# Patient Record
Sex: Female | Born: 1955
Health system: Southern US, Community
[De-identification: ages and names within clinical notes are randomized; demographics above are authoritative.]

## PROBLEM LIST (undated history)

## (undated) DIAGNOSIS — Z807 Family history of other malignant neoplasms of lymphoid, hematopoietic and related tissues: Secondary | ICD-10-CM

## (undated) DIAGNOSIS — D219 Benign neoplasm of connective and other soft tissue, unspecified: Secondary | ICD-10-CM

## (undated) DIAGNOSIS — K449 Diaphragmatic hernia without obstruction or gangrene: Secondary | ICD-10-CM

## (undated) DIAGNOSIS — K219 Gastro-esophageal reflux disease without esophagitis: Secondary | ICD-10-CM

## (undated) DIAGNOSIS — N200 Calculus of kidney: Secondary | ICD-10-CM

## (undated) DIAGNOSIS — Z8049 Family history of malignant neoplasm of other genital organs: Secondary | ICD-10-CM

## (undated) DIAGNOSIS — K648 Other hemorrhoids: Secondary | ICD-10-CM

## (undated) DIAGNOSIS — I8390 Asymptomatic varicose veins of unspecified lower extremity: Secondary | ICD-10-CM

## (undated) DIAGNOSIS — F411 Generalized anxiety disorder: Secondary | ICD-10-CM

## (undated) DIAGNOSIS — Z803 Family history of malignant neoplasm of breast: Secondary | ICD-10-CM

## (undated) DIAGNOSIS — C801 Malignant (primary) neoplasm, unspecified: Secondary | ICD-10-CM

## (undated) DIAGNOSIS — Z800 Family history of malignant neoplasm of digestive organs: Secondary | ICD-10-CM

## (undated) DIAGNOSIS — K589 Irritable bowel syndrome without diarrhea: Secondary | ICD-10-CM

## (undated) DIAGNOSIS — K5790 Diverticulosis of intestine, part unspecified, without perforation or abscess without bleeding: Secondary | ICD-10-CM

## (undated) DIAGNOSIS — D126 Benign neoplasm of colon, unspecified: Secondary | ICD-10-CM

## (undated) DIAGNOSIS — R740 Nonspecific elevation of levels of transaminase and lactic acid dehydrogenase [LDH]: Secondary | ICD-10-CM

## (undated) DIAGNOSIS — R519 Headache, unspecified: Secondary | ICD-10-CM

## (undated) DIAGNOSIS — E785 Hyperlipidemia, unspecified: Secondary | ICD-10-CM

## (undated) DIAGNOSIS — R7401 Elevation of levels of liver transaminase levels: Secondary | ICD-10-CM

## (undated) DIAGNOSIS — R7402 Elevation of levels of lactic acid dehydrogenase (LDH): Secondary | ICD-10-CM

## (undated) HISTORY — DX: Benign neoplasm of colon, unspecified: D12.6

## (undated) HISTORY — DX: Family history of malignant neoplasm of breast: Z80.3

## (undated) HISTORY — DX: Family history of other malignant neoplasms of lymphoid, hematopoietic and related tissues: Z80.7

## (undated) HISTORY — DX: Generalized anxiety disorder: F41.1

## (undated) HISTORY — DX: Family history of malignant neoplasm of other genital organs: Z80.49

## (undated) HISTORY — DX: Elevation of levels of liver transaminase levels: R74.01

## (undated) HISTORY — DX: Asymptomatic varicose veins of unspecified lower extremity: I83.90

## (undated) HISTORY — PX: CATARACT EXTRACTION, BILATERAL: SHX1313

## (undated) HISTORY — DX: Family history of malignant neoplasm of digestive organs: Z80.0

## (undated) HISTORY — DX: Gastro-esophageal reflux disease without esophagitis: K21.9

## (undated) HISTORY — DX: Other hemorrhoids: K64.8

## (undated) HISTORY — DX: Diaphragmatic hernia without obstruction or gangrene: K44.9

## (undated) HISTORY — DX: Diverticulosis of intestine, part unspecified, without perforation or abscess without bleeding: K57.90

## (undated) HISTORY — DX: Irritable bowel syndrome, unspecified: K58.9

## (undated) HISTORY — DX: Elevation of levels of lactic acid dehydrogenase (LDH): R74.02

## (undated) HISTORY — PX: LAPAROSCOPY: SHX197

## (undated) HISTORY — DX: Hyperlipidemia, unspecified: E78.5

## (undated) HISTORY — DX: Nonspecific elevation of levels of transaminase and lactic acid dehydrogenase (ldh): R74.0

## (undated) HISTORY — DX: Benign neoplasm of connective and other soft tissue, unspecified: D21.9

## (undated) HISTORY — PX: DILATION AND CURETTAGE OF UTERUS: SHX78

---

## 1990-05-23 HISTORY — PX: MYOMECTOMY: SHX85

## 1997-08-28 ENCOUNTER — Other Ambulatory Visit: Admission: RE | Admit: 1997-08-28 | Discharge: 1997-08-28 | Payer: Self-pay | Admitting: Obstetrics & Gynecology

## 1998-11-09 ENCOUNTER — Other Ambulatory Visit: Admission: RE | Admit: 1998-11-09 | Discharge: 1998-11-09 | Payer: Self-pay | Admitting: Obstetrics & Gynecology

## 1999-05-24 HISTORY — PX: OTHER SURGICAL HISTORY: SHX169

## 1999-07-27 ENCOUNTER — Encounter: Admission: RE | Admit: 1999-07-27 | Discharge: 1999-07-27 | Payer: Self-pay | Admitting: Neurology

## 1999-07-27 ENCOUNTER — Encounter: Payer: Self-pay | Admitting: Neurology

## 1999-12-28 ENCOUNTER — Other Ambulatory Visit: Admission: RE | Admit: 1999-12-28 | Discharge: 1999-12-28 | Payer: Self-pay | Admitting: Obstetrics & Gynecology

## 2000-01-12 ENCOUNTER — Ambulatory Visit (HOSPITAL_BASED_OUTPATIENT_CLINIC_OR_DEPARTMENT_OTHER): Admission: RE | Admit: 2000-01-12 | Discharge: 2000-01-12 | Payer: Self-pay | Admitting: Urology

## 2001-02-26 ENCOUNTER — Other Ambulatory Visit: Admission: RE | Admit: 2001-02-26 | Discharge: 2001-02-26 | Payer: Self-pay | Admitting: Obstetrics & Gynecology

## 2002-04-24 ENCOUNTER — Other Ambulatory Visit: Admission: RE | Admit: 2002-04-24 | Discharge: 2002-04-24 | Payer: Self-pay | Admitting: Obstetrics and Gynecology

## 2003-05-14 ENCOUNTER — Other Ambulatory Visit: Admission: RE | Admit: 2003-05-14 | Discharge: 2003-05-14 | Payer: Self-pay | Admitting: Obstetrics & Gynecology

## 2004-05-14 ENCOUNTER — Other Ambulatory Visit: Admission: RE | Admit: 2004-05-14 | Discharge: 2004-05-14 | Payer: Self-pay | Admitting: Obstetrics & Gynecology

## 2004-08-24 ENCOUNTER — Ambulatory Visit: Payer: Self-pay | Admitting: Gastroenterology

## 2004-09-08 ENCOUNTER — Ambulatory Visit: Payer: Self-pay | Admitting: Gastroenterology

## 2004-09-10 ENCOUNTER — Ambulatory Visit (HOSPITAL_COMMUNITY): Admission: RE | Admit: 2004-09-10 | Discharge: 2004-09-10 | Payer: Self-pay | Admitting: Gastroenterology

## 2004-10-12 ENCOUNTER — Ambulatory Visit: Payer: Self-pay | Admitting: Gastroenterology

## 2005-05-17 ENCOUNTER — Other Ambulatory Visit: Admission: RE | Admit: 2005-05-17 | Discharge: 2005-05-17 | Payer: Self-pay | Admitting: Obstetrics & Gynecology

## 2006-04-27 ENCOUNTER — Ambulatory Visit: Payer: Self-pay | Admitting: Gastroenterology

## 2006-05-03 ENCOUNTER — Ambulatory Visit: Payer: Self-pay | Admitting: Gastroenterology

## 2006-05-12 ENCOUNTER — Ambulatory Visit: Payer: Self-pay | Admitting: Gastroenterology

## 2007-02-22 ENCOUNTER — Ambulatory Visit: Payer: Self-pay | Admitting: Gastroenterology

## 2007-02-22 LAB — CONVERTED CEMR LAB
ALT: 25 units/L (ref 0–35)
AST: 28 units/L (ref 0–37)
Albumin: 3.8 g/dL (ref 3.5–5.2)
Alkaline Phosphatase: 57 units/L (ref 39–117)
Bilirubin, Direct: 0.3 mg/dL (ref 0.0–0.3)
Total Bilirubin: 1.8 mg/dL — ABNORMAL HIGH (ref 0.3–1.2)
Total Protein: 6.5 g/dL (ref 6.0–8.3)

## 2008-05-28 DIAGNOSIS — K589 Irritable bowel syndrome without diarrhea: Secondary | ICD-10-CM

## 2008-05-28 DIAGNOSIS — K449 Diaphragmatic hernia without obstruction or gangrene: Secondary | ICD-10-CM | POA: Insufficient documentation

## 2008-05-28 DIAGNOSIS — K219 Gastro-esophageal reflux disease without esophagitis: Secondary | ICD-10-CM

## 2008-05-28 DIAGNOSIS — F411 Generalized anxiety disorder: Secondary | ICD-10-CM | POA: Insufficient documentation

## 2008-05-29 ENCOUNTER — Ambulatory Visit: Payer: Self-pay | Admitting: Gastroenterology

## 2008-05-29 DIAGNOSIS — R740 Nonspecific elevation of levels of transaminase and lactic acid dehydrogenase [LDH]: Secondary | ICD-10-CM

## 2008-05-29 DIAGNOSIS — R141 Gas pain: Secondary | ICD-10-CM | POA: Insufficient documentation

## 2008-05-29 DIAGNOSIS — R1011 Right upper quadrant pain: Secondary | ICD-10-CM

## 2008-05-29 DIAGNOSIS — R142 Eructation: Secondary | ICD-10-CM

## 2008-05-29 DIAGNOSIS — R143 Flatulence: Secondary | ICD-10-CM

## 2008-05-30 LAB — CONVERTED CEMR LAB
Basophils Absolute: 0 10*3/uL (ref 0.0–0.1)
Bilirubin, Direct: 0.2 mg/dL (ref 0.0–0.3)
Eosinophils Absolute: 0.1 10*3/uL (ref 0.0–0.7)
Eosinophils Relative: 1.1 % (ref 0.0–5.0)
HCT: 40.2 % (ref 36.0–46.0)
MCHC: 34.6 g/dL (ref 30.0–36.0)
MCV: 92.8 fL (ref 78.0–100.0)
Monocytes Absolute: 0.5 10*3/uL (ref 0.1–1.0)
Neutrophils Relative %: 62.2 % (ref 43.0–77.0)
Platelets: 281 10*3/uL (ref 150–400)
RDW: 12.5 % (ref 11.5–14.6)
Total Protein: 7.1 g/dL (ref 6.0–8.3)
WBC: 7.6 10*3/uL (ref 4.5–10.5)

## 2008-06-12 ENCOUNTER — Ambulatory Visit: Payer: Self-pay | Admitting: Gastroenterology

## 2008-06-23 ENCOUNTER — Ambulatory Visit (HOSPITAL_COMMUNITY): Admission: RE | Admit: 2008-06-23 | Discharge: 2008-06-23 | Payer: Self-pay | Admitting: Internal Medicine

## 2008-07-10 ENCOUNTER — Telehealth: Payer: Self-pay | Admitting: Gastroenterology

## 2008-12-25 ENCOUNTER — Encounter: Payer: Self-pay | Admitting: Gastroenterology

## 2009-10-15 ENCOUNTER — Encounter: Admission: RE | Admit: 2009-10-15 | Discharge: 2009-10-15 | Payer: Self-pay | Admitting: Obstetrics & Gynecology

## 2010-08-19 ENCOUNTER — Telehealth: Payer: Self-pay | Admitting: Gastroenterology

## 2010-08-19 ENCOUNTER — Encounter: Payer: Self-pay | Admitting: Gastroenterology

## 2010-08-19 NOTE — Telephone Encounter (Signed)
Pt hasn't been seen since 06/12/2008. Hx of Elev. LFT's, GAS and Abd. Pain, IBS, GERD, HH. Pt reports she woke up Monday, 08/16/10, with cramping in LL abdomen. The area was also tender when pressed. She states she feels better now, but feels as though she can feel food move thru her system. Her stools are normal now, not black or loose. She denies a fever. Pt offered to try Align and Yogurt like she did before. Pt will try this and call if problem gets worse. Informed pt she needs to f/u with Dr Jarold Motto since it's been 2 years. She stated Dr Jacky Kindle does a physical every year and her LFT's are ok. Pt given appt  With Dr Jarold Motto for 09/07/10 @ 1130am.  [facility]  [address] [place], [place] [address]  Phone: [phone]  Fax: [phone]       August 19, 2010 MRN: [id number]    [facility] [address] [place], [place]  [address]    Dear Ms. DEMARCO,   You have a return appointment with Dr. Jarold Motto on September 07, 2010 at 1130am.  Please remember to bring a complete list of the medicines you are taking, your insurance card and your co-pay.  If you have to cancel or reschedule this appointment, please call before 5:00 pm the evening before to avoid a cancellation fee.  If you have any questions or concerns, please call [phone].    Sincerely,    Graciella Freer RN   Signed by Graciella Freer RN on 08/19/2010 at 10:41 AM  ________________________________________________________________________

## 2010-08-24 NOTE — Letter (Signed)
Summary: Appt Reminder 2  [facility]  [address] [place], [place] [address]   Phone: [phone]  Fax: [phone]        August 19, 2010 MRN: [id number]    [facility] [address] [place], [place]  [address]    Dear Ms. LACINA,   You have a return appointment with Dr. Jarold Motto on September 07, 2010 at 1130am.  Please remember to bring a complete list of the medicines you are taking, your insurance card and your co-pay.  If you have to cancel or reschedule this appointment, please call before 5:00 pm the evening before to avoid a cancellation fee.  If you have any questions or concerns, please call [phone].    Sincerely,    Graciella Freer RN

## 2010-09-06 ENCOUNTER — Encounter: Payer: Self-pay | Admitting: Gastroenterology

## 2010-09-07 ENCOUNTER — Ambulatory Visit: Payer: Self-pay | Admitting: Gastroenterology

## 2010-09-10 ENCOUNTER — Other Ambulatory Visit: Payer: Self-pay | Admitting: Dermatology

## 2010-09-16 ENCOUNTER — Encounter: Payer: Self-pay | Admitting: Gastroenterology

## 2010-10-08 NOTE — Assessment & Plan Note (Signed)
[facility]                         GASTROENTEROLOGY OFFICE NOTE   NAME:JOHNSONTayja, [name]                       MRN:          [id number]  DATE:05/12/2006                            DOB:          [birth year]    Stacy Rivas's repeat liver function tests were normal and all of her  immunological and metabolic studies for causes of occult liver disease  were normal. I suspect some of her abnormal liver function tests were  related to her vitamin supplements that she was using. She has had  negative hepatitis serologies additionally. Upper abdominal ultrasound  exam on May 05, 2006  was entirely normal without evidence of  chololithiasis. The liver showed no mass lesions, or dilated biliary  structures.   I would suggest at this time that we follow Stacy Rivas's liver enzymes at  every 6 month intervals as long as she is asymptomatic. I have advised  her about overuse of alcohol, over-the-counter medications, and Tylenol.  She uses Nexium on a p.r.n. basis for her acid reflux.     Vania Rea. Jarold Motto, MD, Caleen Essex, FAGA  Electronically Signed    DRP/MedQ  DD: 05/12/2006  DT: 05/12/2006  Job #: [id number]   cc:   Geoffry Paradise, M.D.

## 2010-10-08 NOTE — Assessment & Plan Note (Signed)
[facility]                         GASTROENTEROLOGY OFFICE NOTE   Stacy, Rivas                       MRN:          [id number]  DATE:04/27/2006                            DOB:          1955/08/22    Stacy Rivas is a middle-aged white female referred by Dr. Jacky Kindle for  evaluation of abnormal liver function tests.   I previously saw Stacy Rivas in April, 2006 for acid reflux problems.  She  underwent screening colonoscopy which was unremarkable, and upper GI  endoscopy showed a hiatal hernia and reflux esophagitis.  She has been  doing well on Nexium since that time.  Biopsy of her stomach did not  show evidence of Helicobacter pylori infection.  Lab data from that time  was unremarkable.   Patient, since that time, has had a prolonged viral infection  ver the  last 6-8 months, and has been seen by a neurologist and rheumatologist,  and apparently no definite diagnosis has been determined.  Her liver  function tests, apparently, have vacillated over the last 6-8 months  between normal and elevations less than twice normal with normal  bilirubin and alkaline phosphatase levels.  CBC and sed rate had been  normal.  TSH level, also, has been normal as was upper abdominal  ultrasound exam a year ago.   She continues to complain of some vague arthralgias and a papular skin  rash over her hands, and some mild fatigue.  She denies pruritus, mental  status changes, abdominal pain, nausea, vomiting, anorexia or weight  loss.  In fact, she has gained 10 pounds of weight over the last year.  She denies abuse of alcohol, but does drink regularly.  She is on a  variety of multivitamins including Biotin 5000 mcg a day, evening  primrose oil 1300 mg a day, and periodic Nexium.   She denies abdominal pain, dyspepsia, dysphagia, lower back problems,  melena or hematochezia.  She is under a lot of stress at her job in the  [employer].  Other complaints  have included nonspecific alopecia.   FAMILY HISTORY:  Noncontributory.   ALLERGIES:  IN THE PAST SHE HAS HAD REACTIONS TO SULFA MEDICATIONS.   PAST MEDICAL HISTORY:  Otherwise noncontributory, except for chronic  anxiety syndrome and acid reflux.   REVIEW OF SYSTEMS:  Noncontributory.  The patient denies mental status  changes, or any other neurological difficulties at this time.  In terms  of her viral syndrome, she says she is improving daily.  In the past,  she has been treated for interstitial cystitis.   PHYSICAL EXAMINATION:  She is a healthy-appearing white female without  stigmata of chronic liver disease.  She weighs 156 pounds, which is up  10 pounds from a year ago.  Blood pressure is 130/76 and pulse was 76 and regular.  Could not appreciate stigmata of chronic liver disease.  There was no  hepatosplenomegaly, abdominal masses, or tenderness.  She did have a  fine papular rash over the dorsum of her hands, but no other rashes or  spiders.  Mental status was clear.   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  She needs to go through a full  metabolic liver workup for all immune processes, metabolic processes,  and familial liver diseases.  She has had a rather marked weight gain  over the last year may have as simple a process as fatty infiltration of  her liver.   RECOMMENDATIONS:  1. Repeat upper abdominal ultrasound exam.  2. Check ANA, AMA, alpha1- anti-trypsin level, anti-smooth muscle      antibody, iron levels, ferritin, etc.  3. Discontinue all over-the-counter nutritional supplements.  4. GI followup in 2 weeks' time.  We will go over lab data, ultrasound      report, and see if she needs further evaluation, including liver      biopsy.     Stacy Rea. Jarold Motto, MD, Caleen Essex, FAGA  Electronically Signed    DRP/MedQ  DD:  04/27/2006  DT: 04/27/2006  Job #: [id number]   cc:   Geoffry Paradise, M.D.

## 2010-10-08 NOTE — Op Note (Signed)
[facility]  Patient:    Stacy Rivas, Stacy Rivas                       MRN: [id number] Proc. Date: 01/12/00 Adm. Date:  [id number] Disc. Date: [id number] Attending:  Thermon Leyland CC:         Gerrit Friends. Aldona Bar, M.D.   Operative Report  PREOPERATIVE DIAGNOSIS:  Chronic pelvic pain/probable interstitial cystitis.  POSTOPERATIVE DIAGNOSIS:  Chronic pelvic pain/probable interstitial cystitis.  PROCEDURE PERFORMED:  Cystoscopy, urethral dilation, hydraulic over-distention of the bladder, instillation of Clorpactin and Marcaine.  SURGEON:  Barron Alvine, M.D.  ANESTHESIA:  General.  INDICATIONS:  [name] is a 55 year old female.  She reports that for many years she has had recurrent bouts of cystitis.  It appears that often these symptoms resolve on their own and it does not appear by her history that these have always been episodes of bacterial cystitis.  She seems to have had intermittent flare-ups of bladder pressure and irritability.  She recently was evaluated because of suprapubic pressure, frequency and nocturia.  She was given some antibiotics but her symptoms continued to linger.  When we saw her, her urinalysis was clear on several occasions.  She continued to have suprapubic pressure along with pelvic discomfort and frequency of urination. I felt that potentially she had irritable bladder syndrome, interstitial cystitis.  I placed her on additional medication to try to suppress some of her pressure, including Ditropan XL and Uromax.  We initiated interstitial cystitis diet.  Despite these maneuvers, she continued to have lingering symptoms and has had problems now for approximately two months.  At this point she elected to proceed with additional evaluation including cystoscopy with hydraulic over-distention of the bladder.  TECHNIQUE AND FINDINGS:  The patient was brought to the operating room where she had successful induction of  general anesthesia.  She was placed in lithotomy position and prepped and draped in the usual manner.  The urethra was gently dilated from 22 to 32 [place].  Cystoscopy revealed an unremarkable bladder initially.  She underwent hydraulic over-distention to 80 cm of water pressure, which was held for five minutes.  Capacity was approximately 750 cc. She had 2 to 3+ glomerulations.  These were fairly prominent in the dome of the bladder and also on the lateral walls.  It was not complete diffuse glomerulations.  We repeated her hydrodistention.  We then instilled Clorpactin under standard concentration under gravity drainage, 100 to 150 cc at a time to wash her bladder with this medication.  We then instilled some Marcaine in her bladder.  The patient appeared to tolerate the procedure well.  She was brought to the recovery room in stable condition. DD:  01/12/00 TD:  01/12/00 Job: [id number]

## 2011-01-27 ENCOUNTER — Other Ambulatory Visit: Payer: Self-pay | Admitting: Obstetrics & Gynecology

## 2011-01-27 DIAGNOSIS — R928 Other abnormal and inconclusive findings on diagnostic imaging of breast: Secondary | ICD-10-CM

## 2011-02-01 ENCOUNTER — Ambulatory Visit
Admission: RE | Admit: 2011-02-01 | Discharge: 2011-02-01 | Disposition: A | Discharge status: Home / Self Care | Payer: 59 | Source: Ambulatory Visit | Attending: Obstetrics & Gynecology | Admitting: Obstetrics & Gynecology

## 2011-02-01 DIAGNOSIS — R928 Other abnormal and inconclusive findings on diagnostic imaging of breast: Secondary | ICD-10-CM

## 2011-07-29 ENCOUNTER — Other Ambulatory Visit: Payer: Self-pay | Admitting: Obstetrics & Gynecology

## 2011-07-29 DIAGNOSIS — N63 Unspecified lump in unspecified breast: Secondary | ICD-10-CM

## 2011-08-11 ENCOUNTER — Ambulatory Visit
Admission: RE | Admit: 2011-08-11 | Discharge: 2011-08-11 | Disposition: A | Discharge status: Home / Self Care | Payer: 59 | Source: Ambulatory Visit | Attending: Obstetrics & Gynecology | Admitting: Obstetrics & Gynecology

## 2011-08-11 DIAGNOSIS — N63 Unspecified lump in unspecified breast: Secondary | ICD-10-CM

## 2012-01-26 ENCOUNTER — Other Ambulatory Visit: Payer: Self-pay | Admitting: Obstetrics & Gynecology

## 2012-01-26 DIAGNOSIS — N6009 Solitary cyst of unspecified breast: Secondary | ICD-10-CM

## 2012-02-02 ENCOUNTER — Ambulatory Visit
Admission: RE | Admit: 2012-02-02 | Discharge: 2012-02-02 | Disposition: A | Discharge status: Home / Self Care | Payer: 59 | Source: Ambulatory Visit | Attending: Obstetrics & Gynecology | Admitting: Obstetrics & Gynecology

## 2012-02-02 DIAGNOSIS — N6009 Solitary cyst of unspecified breast: Secondary | ICD-10-CM

## 2013-02-25 ENCOUNTER — Other Ambulatory Visit: Payer: Self-pay | Admitting: Obstetrics & Gynecology

## 2013-03-04 ENCOUNTER — Other Ambulatory Visit: Payer: Self-pay | Admitting: Obstetrics & Gynecology

## 2013-03-04 DIAGNOSIS — N63 Unspecified lump in unspecified breast: Secondary | ICD-10-CM

## 2013-03-18 ENCOUNTER — Other Ambulatory Visit: Payer: 59

## 2013-03-22 ENCOUNTER — Other Ambulatory Visit: Payer: 59

## 2013-05-14 ENCOUNTER — Ambulatory Visit
Admission: RE | Admit: 2013-05-14 | Discharge: 2013-05-14 | Disposition: A | Discharge status: Home / Self Care | Payer: BC Managed Care – PPO | Source: Ambulatory Visit | Attending: Obstetrics & Gynecology | Admitting: Obstetrics & Gynecology

## 2013-05-14 DIAGNOSIS — N63 Unspecified lump in unspecified breast: Secondary | ICD-10-CM

## 2013-07-21 DIAGNOSIS — N200 Calculus of kidney: Secondary | ICD-10-CM

## 2013-07-21 HISTORY — PX: LITHOTRIPSY: SUR834

## 2013-07-21 HISTORY — DX: Calculus of kidney: N20.0

## 2013-08-04 ENCOUNTER — Encounter (HOSPITAL_COMMUNITY): Payer: Self-pay | Admitting: Emergency Medicine

## 2013-08-04 ENCOUNTER — Emergency Department (HOSPITAL_COMMUNITY): Payer: BC Managed Care – PPO

## 2013-08-04 ENCOUNTER — Emergency Department (HOSPITAL_COMMUNITY)
Admission: EM | Admit: 2013-08-04 | Discharge: 2013-08-04 | Disposition: A | Discharge status: Home / Self Care | Payer: BC Managed Care – PPO | Attending: Emergency Medicine | Admitting: Emergency Medicine

## 2013-08-04 DIAGNOSIS — Z87891 Personal history of nicotine dependence: Secondary | ICD-10-CM | POA: Insufficient documentation

## 2013-08-04 DIAGNOSIS — N201 Calculus of ureter: Secondary | ICD-10-CM

## 2013-08-04 DIAGNOSIS — K589 Irritable bowel syndrome without diarrhea: Secondary | ICD-10-CM | POA: Insufficient documentation

## 2013-08-04 DIAGNOSIS — N200 Calculus of kidney: Secondary | ICD-10-CM

## 2013-08-04 DIAGNOSIS — N390 Urinary tract infection, site not specified: Secondary | ICD-10-CM

## 2013-08-04 DIAGNOSIS — R231 Pallor: Secondary | ICD-10-CM | POA: Insufficient documentation

## 2013-08-04 DIAGNOSIS — F411 Generalized anxiety disorder: Secondary | ICD-10-CM | POA: Insufficient documentation

## 2013-08-04 DIAGNOSIS — Z79899 Other long term (current) drug therapy: Secondary | ICD-10-CM | POA: Insufficient documentation

## 2013-08-04 DIAGNOSIS — K219 Gastro-esophageal reflux disease without esophagitis: Secondary | ICD-10-CM | POA: Insufficient documentation

## 2013-08-04 LAB — URINALYSIS, ROUTINE W REFLEX MICROSCOPIC
BILIRUBIN URINE: NEGATIVE
GLUCOSE, UA: NEGATIVE mg/dL
KETONES UR: NEGATIVE mg/dL
NITRITE: NEGATIVE
PH: 7.5 (ref 5.0–8.0)
Protein, ur: 30 mg/dL — AB
SPECIFIC GRAVITY, URINE: 1.013 (ref 1.005–1.030)
Urobilinogen, UA: 0.2 mg/dL (ref 0.0–1.0)

## 2013-08-04 LAB — CBC WITH DIFFERENTIAL/PLATELET
Basophils Absolute: 0 10*3/uL (ref 0.0–0.1)
Basophils Relative: 0 % (ref 0–1)
EOS PCT: 0 % (ref 0–5)
Eosinophils Absolute: 0.1 10*3/uL (ref 0.0–0.7)
HEMATOCRIT: 42.7 % (ref 36.0–46.0)
HEMOGLOBIN: 14.9 g/dL (ref 12.0–15.0)
LYMPHS PCT: 13 % (ref 12–46)
Lymphs Abs: 1.6 10*3/uL (ref 0.7–4.0)
MCH: 31.6 pg (ref 26.0–34.0)
MCHC: 34.9 g/dL (ref 30.0–36.0)
MCV: 90.5 fL (ref 78.0–100.0)
MONO ABS: 0.8 10*3/uL (ref 0.1–1.0)
MONOS PCT: 6 % (ref 3–12)
NEUTROS ABS: 10 10*3/uL — AB (ref 1.7–7.7)
Neutrophils Relative %: 80 % — ABNORMAL HIGH (ref 43–77)
Platelets: 263 10*3/uL (ref 150–400)
RBC: 4.72 MIL/uL (ref 3.87–5.11)
RDW: 12.7 % (ref 11.5–15.5)
WBC: 12.5 10*3/uL — AB (ref 4.0–10.5)

## 2013-08-04 LAB — URINE MICROSCOPIC-ADD ON

## 2013-08-04 LAB — BASIC METABOLIC PANEL
BUN: 16 mg/dL (ref 6–23)
CHLORIDE: 102 meq/L (ref 96–112)
CO2: 23 meq/L (ref 19–32)
Calcium: 9.6 mg/dL (ref 8.4–10.5)
Creatinine, Ser: 0.74 mg/dL (ref 0.50–1.10)
GFR calc non Af Amer: >90 mL/min (ref >90)
Glucose, Bld: 118 mg/dL — ABNORMAL HIGH (ref 70–99)
POTASSIUM: 3.4 meq/L — AB (ref 3.7–5.3)
Sodium: 140 mEq/L (ref 137–147)

## 2013-08-04 MED ORDER — TAMSULOSIN HCL 0.4 MG PO CAPS
ORAL_CAPSULE | ORAL | Status: DC
Start: 2013-08-04 — End: 2013-09-27

## 2013-08-04 MED ORDER — MORPHINE SULFATE 4 MG/ML IJ SOLN
4.0000 mg | Freq: Once | INTRAMUSCULAR | Status: AC
  Administered 2013-08-04: 4 mg via INTRAVENOUS
  Filled 2013-08-04: qty 1

## 2013-08-04 MED ORDER — ONDANSETRON HCL 4 MG/2ML IJ SOLN
4.0000 mg | Freq: Once | INTRAMUSCULAR | Status: AC
  Administered 2013-08-04: 4 mg via INTRAVENOUS
  Filled 2013-08-04: qty 2

## 2013-08-04 MED ORDER — DEXTROSE 5 % IV SOLN
1.0000 g | Freq: Once | INTRAVENOUS | Status: AC
  Administered 2013-08-04: 1 g via INTRAVENOUS
  Filled 2013-08-04: qty 10

## 2013-08-04 MED ORDER — SODIUM CHLORIDE 0.9 % IV SOLN
INTRAVENOUS | Status: DC
  Administered 2013-08-04: 09:00:00 via INTRAVENOUS

## 2013-08-04 MED ORDER — CEPHALEXIN 500 MG PO CAPS: 500.0000 mg | ORAL_CAPSULE | Freq: Three times a day (TID) | ORAL | Status: DC

## 2013-08-04 MED ORDER — OXYCODONE-ACETAMINOPHEN 5-325 MG PO TABS: ORAL_TABLET | ORAL | Status: DC

## 2013-08-04 MED ORDER — HYDROMORPHONE HCL PF 1 MG/ML IJ SOLN
1.0000 mg | Freq: Once | INTRAMUSCULAR | Status: AC
  Administered 2013-08-04: 1 mg via INTRAVENOUS
  Filled 2013-08-04: qty 1

## 2013-08-04 MED ORDER — ONDANSETRON 4 MG PO TBDP: 4.0000 mg | ORAL_TABLET | Freq: Three times a day (TID) | ORAL | Status: DC | PRN

## 2013-08-04 NOTE — ED Notes (Addendum)
Pt c/o abd pain and nausea since 1930 last night, states it eased off now has gotten worse. Pt now vomited once at registration.

## 2013-08-04 NOTE — ED Notes (Signed)
Patient transported to CT 

## 2013-08-04 NOTE — ED Provider Notes (Signed)
CSN: [id number]     Arrival date & time 08/04/13  0848 History   First MD Initiated Contact with Patient 08/04/13 [phone]     Chief Complaint  Patient presents with  . Abdominal Pain  . Nausea  . Emesis     (Consider location/radiation/quality/duration/timing/severity/associated sxs/prior Treatment) HPI Patient reports last night she started getting severe left flank pain radiating into her left lower quadrant that does not go away but waxes and wanes. She states she is extremely uncomfortable and she cannot sit still because of the pain. Nothing she does makes the pain worse, nothing she does makes the pain better. She has had nausea and vomited once when she got to the ED. She denies diarrhea today however she did have diarrhea earlier in the week. She states she's never had this before. She denies any change in activity or diet. Patient does report she drinks a lot of milk and tea.  Patient reports her father has had renal stones  PCP Dr Reynaldo Minium   Past Medical History  Diagnosis Date  . Anxiety state, unspecified   . Hiatal hernia   . Esophageal reflux   . Nonspecific elevation of levels of transaminase or lactic acid dehydrogenase (LDH)   . Anxiety state, unspecified   . IBS (irritable bowel syndrome)    History reviewed. No pertinent past surgical history. Family History  Problem Relation Age of Onset  . Lymphoma Father   . Diabetes    . Heart disease Father    History  Substance Use Topics  . Smoking status: Former Research scientist (life sciences)  . Smokeless tobacco: Not on file  . Alcohol Use: Yes   emplyed  OB History   Grav Para Term Preterm Abortions TAB SAB Ect Mult Living                 Review of Systems  All other systems reviewed and are negative.      Allergies  Sulfonamide derivatives  Home Medications   Current Outpatient Rx  Name  Route  Sig  Dispense  Refill  . ALPRAZolam (XANAX) 0.25 MG tablet   Oral   Take 0.12 mg by mouth daily as needed for anxiety.         [name] Kitchen esomeprazole (NEXIUM) 20 MG packet   Oral   Take 20 mg by mouth daily as needed.         [name] Kitchen ibuprofen (ADVIL,MOTRIN) 200 MG tablet   Oral   Take 400 mg by mouth every 6 (six) hours as needed for moderate pain.         . Multiple Vitamins-Minerals (ALIVE ONCE DAILY WOMENS 50+) TABS   Oral   Take 1 tablet by mouth daily.         . pantoprazole (PROTONIX) 20 MG tablet   Oral   Take 20 mg by mouth daily as needed for heartburn.         Vladimir Faster Glycol-Propyl Glycol (SYSTANE) 0.4-0.3 % GEL   Both Eyes   Place 1 drop into both eyes daily as needed (dry eyes).          ED Triage Vitals  Enc Vitals Group     BP 08/04/13 1022 144/69 mmHg     Pulse Rate 08/04/13 1022 65     Resp 08/04/13 1022 22     Temp 08/04/13 1022 97.4 F (36.3 C)     Temp src 08/04/13 1022 Oral     SpO2 08/04/13 1022 100 %  Weight --      Height --      Head Cir --      Peak Flow --      Pain Score 08/04/13 1025 8     Pain Loc --      Pain Edu? --      Excl. in [place]? --      Vital signs normal   Physical Exam  Nursing note and vitals reviewed. Constitutional: She is oriented to person, place, and time. She appears well-developed and well-nourished.  Non-toxic appearance. She does not appear ill. She appears distressed.  Pt unable to lay still b/o pain  HENT:  Head: Normocephalic and atraumatic.  Right Ear: External ear normal.  Left Ear: External ear normal.  Nose: Nose normal. No mucosal edema or rhinorrhea.  Mouth/Throat: Oropharynx is clear and moist and mucous membranes are normal. No dental abscesses or uvula swelling.  Eyes: Conjunctivae and EOM are normal. Pupils are equal, round, and reactive to light.  Neck: Normal range of motion and full passive range of motion without pain. Neck supple.  Cardiovascular: Normal rate, regular rhythm and normal heart sounds.  Exam reveals no gallop and no friction rub.   No murmur heard. Pulmonary/Chest: Effort normal and breath sounds  normal. No respiratory distress. She has no wheezes. She has no rhonchi. She has no rales. She exhibits no tenderness and no crepitus.  nontender in left flank  Abdominal: Soft. Normal appearance and bowel sounds are normal. She exhibits no distension. There is no tenderness. There is no rebound and no guarding.  Musculoskeletal: Normal range of motion. She exhibits no edema and no tenderness.  Moves all extremities well.   Neurological: She is alert and oriented to person, place, and time. She has normal strength. No cranial nerve deficit.  Skin: Skin is warm, dry and intact. No rash noted. No erythema. There is pallor.  Psychiatric: Her speech is normal and behavior is normal. Her mood appears anxious.    ED Course  Procedures (including critical care time)  Medications  0.9 %  sodium chloride infusion ( Intravenous New Bag/Given 08/04/13 0928)  HYDROmorphone (DILAUDID) injection 1 mg (1 mg Intravenous Given 08/04/13 0927)  ondansetron (ZOFRAN) injection 4 mg (4 mg Intravenous Given 08/04/13 0925)  morphine 4 MG/ML injection 4 mg (4 mg Intravenous Given 08/04/13 1029)  cefTRIAXone (ROCEPHIN) 1 g in dextrose 5 % 50 mL IVPB (1 g Intravenous New Bag/Given 08/04/13 1323)   Recheck 10:10 states pain is overall better but still has some intermittent episodes of increased pain. More pain meds ordered. Reviewed her CT scan, and discussed the large prox UPJ stone and her staghorn stone. States she used to see urology for Interstitial Cystitis. As I was leaving the room, her pain got worse, morphine ordered.   12:00 pt states her pain is much improved. Just now able to give UA.   14:00 PT has had her antibiotics.  States her pain is much improved. We discussed reasons to return to the ED and to f/u with urology tomorrow.   Labs Review Results for orders placed during the hospital encounter of 08/04/13  URINALYSIS, ROUTINE W REFLEX MICROSCOPIC      Result Value Ref Range   Color, Urine YELLOW  YELLOW    APPearance TURBID (*) CLEAR   Specific Gravity, Urine 1.013  1.005 - 1.030   pH 7.5  5.0 - 8.0   Glucose, UA NEGATIVE  NEGATIVE mg/dL   Hgb urine dipstick LARGE (*)  NEGATIVE   Bilirubin Urine NEGATIVE  NEGATIVE   Ketones, ur NEGATIVE  NEGATIVE mg/dL   Protein, ur 30 (*) NEGATIVE mg/dL   Urobilinogen, UA 0.2  0.0 - 1.0 mg/dL   Nitrite NEGATIVE  NEGATIVE   Leukocytes, UA MODERATE (*) NEGATIVE  BASIC METABOLIC PANEL      Result Value Ref Range   Sodium 140  137 - 147 mEq/L   Potassium 3.4 (*) 3.7 - 5.3 mEq/L   Chloride 102  96 - 112 mEq/L   CO2 23  19 - 32 mEq/L   Glucose, Bld 118 (*) 70 - 99 mg/dL   BUN 16  6 - 23 mg/dL   Creatinine, Ser 0.74  0.50 - 1.10 mg/dL   Calcium 9.6  8.4 - 10.5 mg/dL   GFR calc non Af Amer >90  >90 mL/min   GFR calc Af Amer >90  >90 mL/min  CBC WITH DIFFERENTIAL      Result Value Ref Range   WBC 12.5 (*) 4.0 - 10.5 K/uL   RBC 4.72  3.87 - 5.11 MIL/uL   Hemoglobin 14.9  12.0 - 15.0 g/dL   HCT 42.7  36.0 - 46.0 %   MCV 90.5  78.0 - 100.0 fL   MCH 31.6  26.0 - 34.0 pg   MCHC 34.9  30.0 - 36.0 g/dL   RDW 12.7  11.5 - 15.5 %   Platelets 263  150 - 400 K/uL   Neutrophils Relative % 80 (*) 43 - 77 %   Neutro Abs 10.0 (*) 1.7 - 7.7 K/uL   Lymphocytes Relative 13  12 - 46 %   Lymphs Abs 1.6  0.7 - 4.0 K/uL   Monocytes Relative 6  3 - 12 %   Monocytes Absolute 0.8  0.1 - 1.0 K/uL   Eosinophils Relative 0  0 - 5 %   Eosinophils Absolute 0.1  0.0 - 0.7 K/uL   Basophils Relative 0  0 - 1 %   Basophils Absolute 0.0  0.0 - 0.1 K/uL  URINE MICROSCOPIC-ADD ON      Result Value Ref Range   Squamous Epithelial / LPF RARE  RARE   WBC, UA 7-10  <3 WBC/hpf   RBC / HPF 21-50  <3 RBC/hpf   Bacteria, UA FEW (*) RARE   Urine-Other MUCOUS PRESENT      Laboratory interpretation all normal except leukocytosis   Imaging Review Ct Abdomen Pelvis Wo Contrast  08/04/2013   CLINICAL DATA:  Left-sided abdominal pain  EXAM: CT ABDOMEN AND PELVIS WITHOUT CONTRAST   TECHNIQUE: Multidetector CT imaging of the abdomen and pelvis was performed following the standard protocol without intravenous contrast.  COMPARISON:  US ABDOMEN COMPLETE dated 06/23/2008  FINDINGS: The lung bases are clear. Staghorn type left upper renal pole calculi noted measuring 1.3 cm and 1.1 cm on images 18 and 17, respectively. There is mild left hydroureteronephrosis to the level of a 4 mm proximal left ureteral calculus image 29. Distal left ureteral decompression is identified. No other radiopaque renal or ureteral calculus is identified. Unenhanced liver, gallbladder, right kidney, adrenal glands, spleen, and pancreas are normal. No ascites, lymphadenopathy, or free fluid. Mild atheromatous aortic calcification without aneurysm.  Lobulated uterine contour with probable right posterior predominantly subserosal 8.7 cm fibroid image 62. Bladder is normal. Ovaries are normal. Normal appendix. No bowel wall thickening or focal segmental dilatation. No acute osseous finding.  IMPRESSION: 4 mm proximal left ureteral calculus producing mild left hydroureteronephrosis.  Staghorn  type nonobstructing left upper renal pole calculi.  Lobulated uterine contour likely indicating fibroids, 1 of which demonstrates a predominantly subserosal component and could be associated with pelvic symptoms due to size. This could be further evaluated with nonemergent outpatient pelvic MRI for further characterization and determination of possible treatment options.   Electronically Signed   By: Conchita Paris M.D.   On: 08/04/2013 09:55     EKG Interpretation None      MDM   Final diagnoses:  Ureteral stone  Staghorn calculus  UTI (urinary tract infection)    New Prescriptions   CEPHALEXIN (KEFLEX) 500 MG CAPSULE    Take 1 capsule (500 mg total) by mouth 3 (three) times daily.   ONDANSETRON (ZOFRAN ODT) 4 MG DISINTEGRATING TABLET    Take 1 tablet (4 mg total) by mouth every 8 (eight) hours as needed for nausea or  vomiting.   OXYCODONE-ACETAMINOPHEN (PERCOCET/ROXICET) 5-325 MG PER TABLET    Take 1 or 2 po Q 6hrs for pain   TAMSULOSIN (FLOMAX) 0.4 MG CAPS CAPSULE    Take 1 po QD until you pass the stone.    Plan discharge   Rolland Porter, MD, Alanson Aly, MD 08/04/13 [phone]

## 2013-08-04 NOTE — ED Notes (Signed)
Patient states that she cannot give sample at this time.  

## 2013-08-04 NOTE — Discharge Instructions (Signed)
Drink plenty of fluids.Take the medications as prescribed. Call Dr Cy Blamer office tomorrow to be seen in the office. If you get worse, such as fever, or uncontrolled vomiting or pain despite taking the medications, return to [facility] ED.    Ureteral Colic (Kidney Stones) Ureteral colic is the result of a condition when kidney stones form inside the kidney. Once kidney stones are formed they may move into the tube that connects the kidney with the bladder (ureter). If this occurs, this condition may cause pain (colic) in the ureter.  CAUSES  Pain is caused by stone movement in the ureter and the obstruction caused by the stone. SYMPTOMS  The pain comes and goes as the ureter contracts around the stone. The pain is usually intense, sharp, and stabbing in character. The location of the pain may move as the stone moves through the ureter. When the stone is near the kidney the pain is usually located in the back and radiates to the belly (abdomen). When the stone is ready to pass into the bladder the pain is often located in the lower abdomen on the side the stone is located. At this location, the symptoms may mimic those of a urinary tract infection with urinary frequency. Once the stone is located here it often passes into the bladder and the pain disappears completely. TREATMENT   Your caregiver will provide you with medicine for pain relief.  You may require specialized follow-up X-rays.  The absence of pain does not always mean that the stone has passed. It may have just stopped moving. If the urine remains completely obstructed, it can cause loss of kidney function or even complete destruction of the involved kidney. It is your responsibility and in your interest that X-rays and follow-ups as suggested by your caregiver are completed. Relief of pain without passage of the stone can be associated with severe damage to the kidney, including loss of kidney function on that side.  If your stone  does not pass on its own, additional measures may be taken by your caregiver to ensure its removal. HOME CARE INSTRUCTIONS   Increase your fluid intake. Water is the preferred fluid since juices containing vitamin C may acidify the urine making it less likely for certain stones (uric acid stones) to pass.  Strain all urine. A strainer will be provided. Keep all particulate matter or stones for your caregiver to inspect.  Take your pain medicine as directed.  Make a follow-up appointment with your caregiver as directed.  Remember that the goal is passage of your stone. The absence of pain does not mean the stone is gone. Follow your caregiver's instructions.  Only take over-the-counter or prescription medicines for pain, discomfort, or fever as directed by your caregiver. SEEK MEDICAL CARE IF:   Pain cannot be controlled with the prescribed medicine.  You have a fever.  Pain continues for longer than your caregiver advises it should.  There is a change in the pain, and you develop chest discomfort or constant abdominal pain.  You feel faint or pass out. MAKE SURE YOU:   Understand these instructions.  Will watch your condition.  Will get help right away if you are not doing well or get worse. Document Released: 02/16/2005 Document Revised: 09/03/2012 Document Reviewed: 11/03/2010 [facility] Patient Information 2014 [place], [place].    Kidney Stones Kidney stones (urolithiasis) are deposits that form inside your kidneys. The intense pain is caused by the stone moving through the urinary tract. When the stone  moves, the ureter goes into spasm around the stone. The stone is usually passed in the urine.  CAUSES   A disorder that makes certain neck glands produce too much parathyroid hormone (primary hyperparathyroidism).  A buildup of uric acid crystals, similar to gout in your joints.  Narrowing (stricture) of the ureter.  A kidney obstruction present at birth (congenital  obstruction).  Previous surgery on the kidney or ureters.  Numerous kidney infections. SYMPTOMS   Feeling sick to your stomach (nauseous).  Throwing up (vomiting).  Blood in the urine (hematuria).  Pain that usually spreads (radiates) to the groin.  Frequency or urgency of urination. DIAGNOSIS   Taking a history and physical exam.  Blood or urine tests.  CT scan.  Occasionally, an examination of the inside of the urinary bladder (cystoscopy) is performed. TREATMENT   Observation.  Increasing your fluid intake.  Extracorporeal shock wave lithotripsy This is a noninvasive procedure that uses shock waves to break up kidney stones.  Surgery may be needed if you have severe pain or persistent obstruction. There are various surgical procedures. Most of the procedures are performed with the use of small instruments. Only small incisions are needed to accommodate these instruments, so recovery time is minimized. The size, location, and chemical composition are all important variables that will determine the proper choice of action for you. Talk to your health care provider to better understand your situation so that you will minimize the risk of injury to yourself and your kidney.  HOME CARE INSTRUCTIONS   Drink enough water and fluids to keep your urine clear or pale yellow. This will help you to pass the stone or stone fragments.  Strain all urine through the provided strainer. Keep all particulate matter and stones for your health care provider to see. The stone causing the pain may be as small as a grain of salt. It is very important to use the strainer each and every time you pass your urine. The collection of your stone will allow your health care provider to analyze it and verify that a stone has actually passed. The stone analysis will often identify what you can do to reduce the incidence of recurrences.  Only take over-the-counter or prescription medicines for pain,  discomfort, or fever as directed by your health care provider.  Make a follow-up appointment with your health care provider as directed.  Get follow-up X-rays if required. The absence of pain does not always mean that the stone has passed. It may have only stopped moving. If the urine remains completely obstructed, it can cause loss of kidney function or even complete destruction of the kidney. It is your responsibility to make sure X-rays and follow-ups are completed. Ultrasounds of the kidney can show blockages and the status of the kidney. Ultrasounds are not associated with any radiation and can be performed easily in a matter of minutes. SEEK MEDICAL CARE IF:  You experience pain that is progressive and unresponsive to any pain medicine you have been prescribed. SEEK IMMEDIATE MEDICAL CARE IF:   Pain cannot be controlled with the prescribed medicine.  You have a fever or shaking chills.  The severity or intensity of pain increases over 18 hours and is not relieved by pain medicine.  You develop a new onset of abdominal pain.  You feel faint or pass out.  You are unable to urinate. MAKE SURE YOU:   Understand these instructions.  Will watch your condition.  Will get help right away if  you are not doing well or get worse. Document Released: 05/09/2005 Document Revised: 01/09/2013 Document Reviewed: 10/10/2012 [facility] Patient Information 2014 [place].  Diet for Kidney Stones Kidney stones are small, hard masses that form inside your kidneys. They are made up of salts and minerals and often form when high levels build up in the urine. The minerals can then start to build up, crystalize, and stick together to form stones. There are several different types of kidney stones. The following types of stones may be influenced by dietary factors:   Calcium Oxalate Stones. An oxalate is a salt found in certain foods. Within the body, calcium can combine with oxalates to form  calcium oxalate stones, which can be excreted in the urine in high amounts. This is the most common type of kidney stone.  Calcium Phosphate Stones. These stones may occur when the pH of the urine becomes too high, or less acidic, from too much calcium being excreted in the urine. The pH is a measure of how acidic or basic a substance is.  Uric Acid Stones. This type of stone occurs when the pH of the urine becomes too low, or very acidic, because substances called purines build up in the urine. Purines are found in animal proteins. When the urine is highly concentrated with acid, uric acid kidney stones can form.  Other risk factors for kidney stones include genetics, environment, and being overweight. Your caregiver may ask you to follow specific diet guidelines based on the type of stone you have to lessen the chances of your body making more kidney stones.  GENERAL GUIDELINES FOR ALL TYPES OF STONES  Drink plenty of fluid. Drink 12 16 cups of fluid a day, drinking mainly water.This is the most important thing you can do to prevent the formation of future kidney stones.  Maintain a healthy weight. Your caregiver or dietitian can help you determine what a healthy weight is for you. If you are overweight, weight loss may help prevent the formation of future kidney stones.  Eat a diet adequate in animal protein. Too much animal protein can contribute to the formation of stones. Your dietitian can help you determine how much protein you should be eating. Avoid low carbohydrate, high protein diets.  Follow a balanced eating approach. The DASH diet, which stands for "Dietary Approaches to Stop Hypertension," is an effective meal plan for reducing stone formation. This diet is high in fruits, vegetables, dairy, and whole grains and low in animal protein. Ask your caregiver or dietitian for information about the DASH diet. ADDITIONAL DIET GUIDELINES FOR CALCIUM STONES Avoid foods high in salt. This  includes table salt, salt seasonings, MSG, soy sauce, cured and processed meats, salted crackers and snack foods, fast food, and canned soups and foods. Ask your caregiver or dietitian for information about reducing sodium in your diet or following the low sodium diet.  Ensure adequate calcium intake. Use the following table for calcium guidelines:  Men 70 years old and younger  1000 mg/day.  Men 28 years old and older  1500 mg/day.  Women 78 60 years old  1000 mg/day.  Women 50 years and older  1500 mg/day. Your dietitian can help you determine if you are getting enough calcium in your diet. Foods that are high in calcium include dairy products, broccoli, cheese, yogurt, and pudding. If you need to take a calcium supplement, take it only in the form of calcium citrate.  Avoid foods high in oxalate. Be sure that  any supplements you take do not contain more than 500 mg of vitamin C. Vitamin C is converted into oxalate in the body. You do not need to avoid fruits and vegetables high in vitamin C.   Grains: High-fiber or bran cereal, whole-wheat bread, grits, barley, buckwheat, amaranth, pretzels, and fruitcake.  Vegetables: Dried beans, wax beans, dark leafy greens, eggplant, leeks, okra, parsley, rutabaga, tomato paste, watercress, zucchini, and escarole.  Fruit: Dried apricots, red currants, figs, kiwi, and rhubarb.  Meat and Meat Substitutes: Soybeans and foods made from soy (soyburger, miso), dried beans, peanut butter.  Milk: Chocolate milk mixes and soymilk.  Fats and Oils: Nuts (peanuts, almonds, pecans, cashews, hazelnuts) and nut butters, sesame seeds, and tDahini paste.  Condiments/Miscellaneous: Chocolate, carob, marmalade, poppy seeds, instant iced tea, and juice from high-oxalate fruits.  Document Released: 09/03/2010 Document Revised: 11/08/2011 Document Reviewed: 10/24/2011 [facility] Patient Information 2014 [place].   Urinary Tract Infection Urinary tract  infections (UTIs) can develop anywhere along your urinary tract. Your urinary tract is your body's drainage system for removing wastes and extra water. Your urinary tract includes two kidneys, two ureters, a bladder, and a urethra. Your kidneys are a pair of bean-shaped organs. Each kidney is about the size of your fist. They are located below your ribs, one on each side of your spine. CAUSES Infections are caused by microbes, which are microscopic organisms, including fungi, viruses, and bacteria. These organisms are so small that they can only be seen through a microscope. Bacteria are the microbes that most commonly cause UTIs. SYMPTOMS  Symptoms of UTIs may vary by age and gender of the patient and by the location of the infection. Symptoms in young women typically include a frequent and intense urge to urinate and a painful, burning feeling in the bladder or urethra during urination. Older women and men are more likely to be tired, shaky, and weak and have muscle aches and abdominal pain. A fever may mean the infection is in your kidneys. Other symptoms of a kidney infection include pain in your back or sides below the ribs, nausea, and vomiting. DIAGNOSIS To diagnose a UTI, your caregiver will ask you about your symptoms. Your caregiver also will ask to provide a urine sample. The urine sample will be tested for bacteria and white blood cells. White blood cells are made by your body to help fight infection. TREATMENT  Typically, UTIs can be treated with medication. Because most UTIs are caused by a bacterial infection, they usually can be treated with the use of antibiotics. The choice of antibiotic and length of treatment depend on your symptoms and the type of bacteria causing your infection. HOME CARE INSTRUCTIONS  If you were prescribed antibiotics, take them exactly as your caregiver instructs you. Finish the medication even if you feel better after you have only taken some of the  medication.  Drink enough water and fluids to keep your urine clear or pale yellow.  Avoid caffeine, tea, and carbonated beverages. They tend to irritate your bladder.  Empty your bladder often. Avoid holding urine for long periods of time.  Empty your bladder before and after sexual intercourse.  After a bowel movement, women should cleanse from front to back. Use each tissue only once. SEEK MEDICAL CARE IF:   You have back pain.  You develop a fever.  Your symptoms do not begin to resolve within 3 days. SEEK IMMEDIATE MEDICAL CARE IF:   You have severe back pain or lower abdominal pain.  You develop chills.  You have nausea or vomiting.  You have continued burning or discomfort with urination. MAKE SURE YOU:   Understand these instructions.  Will watch your condition.  Will get help right away if you are not doing well or get worse. Document Released: 02/16/2005 Document Revised: 11/08/2011 Document Reviewed: 06/17/2011 [facility] Patient Information 2014 [place].

## 2013-08-06 LAB — URINE CULTURE

## 2013-08-07 ENCOUNTER — Telehealth (HOSPITAL_COMMUNITY): Payer: Self-pay | Admitting: *Deleted

## 2013-08-07 NOTE — ED Notes (Signed)
+   urine Amoxicillin 250 mg TID x 7 days . Stop CephalexinChart.Reviewed by Charlann Lange -follow up? How is she? If she febrile return to ER.

## 2013-08-07 NOTE — ED Notes (Addendum)
Stop Cephalexin Amoxicillin 250 mg TID x 7 days written by Charlann Lange called to pharmacy by [facility]

## 2013-08-07 NOTE — Progress Notes (Signed)
ED Antimicrobial Stewardship Positive Culture Follow Up   Shantinique Picazo is an 58 y.o. female who presented to [facility] on 08/04/2013 with a chief complaint of  Chief Complaint  Patient presents with  . Abdominal Pain  . Nausea  . Emesis    Recent Results (from the past 720 hour(s))  URINE CULTURE     Status: None   Collection Time    08/04/13  1:32 PM      Result Value Ref Range Status   Specimen Description URINE, CLEAN CATCH   Final   Special Requests NONE   Final   Culture  Setup Time     Final   Value: 08/04/2013 18:59     Performed at [place]     Final   Value: >=100,000 COLONIES/ML     Performed at [facility]   Culture     Final   Value: ENTEROCOCCUS SPECIES     Performed at [facility]   Report Status 08/06/2013 FINAL   Final   Organism ID, Bacteria ENTEROCOCCUS SPECIES   Final    [x]  Treated with cephalexin, organism resistant to prescribed antimicrobial []  Patient discharged originally without antimicrobial agent and treatment is now indicated  New antibiotic prescription: amoxicillin 250mg  - 1 capsule three times daily for 7 days  ED Provider: Anderson Malta, PA-C   Candie Mile 08/07/2013, 10:05 AM Infectious Diseases Pharmacist Phone# [phone]

## 2013-08-13 ENCOUNTER — Encounter (HOSPITAL_COMMUNITY): Payer: Self-pay | Admitting: *Deleted

## 2013-08-13 ENCOUNTER — Other Ambulatory Visit: Payer: Self-pay | Admitting: Urology

## 2013-08-15 ENCOUNTER — Ambulatory Visit (HOSPITAL_COMMUNITY): Payer: BC Managed Care – PPO

## 2013-08-15 ENCOUNTER — Encounter (HOSPITAL_COMMUNITY)
Admission: RE | Disposition: A | Discharge status: Home / Self Care | Payer: Self-pay | Source: Ambulatory Visit | Attending: Urology

## 2013-08-15 ENCOUNTER — Encounter (HOSPITAL_COMMUNITY): Payer: Self-pay

## 2013-08-15 ENCOUNTER — Ambulatory Visit (HOSPITAL_COMMUNITY)
Admission: RE | Admit: 2013-08-15 | Discharge: 2013-08-15 | Disposition: A | Discharge status: Home / Self Care | Payer: BC Managed Care – PPO | Source: Ambulatory Visit | Attending: Urology | Admitting: Urology

## 2013-08-15 DIAGNOSIS — N200 Calculus of kidney: Secondary | ICD-10-CM | POA: Insufficient documentation

## 2013-08-15 DIAGNOSIS — Z87891 Personal history of nicotine dependence: Secondary | ICD-10-CM | POA: Insufficient documentation

## 2013-08-15 DIAGNOSIS — Z79899 Other long term (current) drug therapy: Secondary | ICD-10-CM | POA: Insufficient documentation

## 2013-08-15 DIAGNOSIS — K219 Gastro-esophageal reflux disease without esophagitis: Secondary | ICD-10-CM | POA: Insufficient documentation

## 2013-08-15 DIAGNOSIS — N201 Calculus of ureter: Secondary | ICD-10-CM | POA: Insufficient documentation

## 2013-08-15 HISTORY — DX: Calculus of kidney: N20.0

## 2013-08-15 LAB — BASIC METABOLIC PANEL
BUN: 9 mg/dL (ref 6–23)
CHLORIDE: 103 meq/L (ref 96–112)
CO2: 26 meq/L (ref 19–32)
CREATININE: 0.61 mg/dL (ref 0.50–1.10)
Calcium: 9.7 mg/dL (ref 8.4–10.5)
GFR calc Af Amer: >90 mL/min (ref >90)
GFR calc non Af Amer: >90 mL/min (ref >90)
GLUCOSE: 89 mg/dL (ref 70–99)
Potassium: 3.6 mEq/L — ABNORMAL LOW (ref 3.7–5.3)
Sodium: 141 mEq/L (ref 137–147)

## 2013-08-15 SURGERY — LITHOTRIPSY, ESWL
Anesthesia: LOCAL | Laterality: Left

## 2013-08-15 MED ORDER — DIPHENHYDRAMINE HCL 25 MG PO CAPS
25.0000 mg | ORAL_CAPSULE | ORAL | Status: AC
  Administered 2013-08-15: 25 mg via ORAL
  Filled 2013-08-15: qty 1

## 2013-08-15 MED ORDER — DEXTROSE-NACL 5-0.45 % IV SOLN
INTRAVENOUS | Status: DC
  Administered 2013-08-15: 10:00:00 via INTRAVENOUS

## 2013-08-15 MED ORDER — DIAZEPAM 5 MG PO TABS
10.0000 mg | ORAL_TABLET | ORAL | Status: AC
  Administered 2013-08-15: 10 mg via ORAL
  Filled 2013-08-15: qty 2

## 2013-08-15 MED ORDER — DEXTROSE 5 % IV SOLN
2.0000 g | Freq: Once | INTRAVENOUS | Status: AC
  Administered 2013-08-15: 2 g via INTRAVENOUS
  Filled 2013-08-15 (×3): qty 2

## 2013-08-15 NOTE — Interval H&P Note (Signed)
History and Physical Interval Note:  08/15/2013 11:59 AM  Stacy Rivas  has presented today for surgery, with the diagnosis of Left Ureteral Stone  The various methods of treatment have been discussed with the patient and family. After consideration of risks, benefits and other options for treatment, the patient has consented to  Procedure(s): LEFT EXTRACORPOREAL SHOCK WAVE LITHOTRIPSY (ESWL) (Left) as a surgical intervention .  The patient's history has been reviewed, patient examined, no change in status, stable for surgery.  I have reviewed the patient's chart and labs.  Questions were answered to the patient's satisfaction.     Keanon Bevins S

## 2013-08-15 NOTE — Discharge Instructions (Signed)
See [facility] discharge instructions in chart.  

## 2013-08-15 NOTE — Op Note (Signed)
See [facility] note scanned into chart. 

## 2013-08-15 NOTE — Progress Notes (Signed)
Lt flank side, redness noted, post lithotripsy.  Pt has voided.

## 2013-08-15 NOTE — H&P (Signed)
History of Present Illness Stacy Rivas presents today for follow-up. She has been seen in our office a couple times in the last week. I've seen her in the distant past for interstitial cystitis and she requested to reestablish with me. She has been diagnosed with a 4 loss millimeter left proximal ureteral stone. She also has significant stone burden within the upper pole of her left kidney. This was her first bout of nephrolithiasis. In the emergency room urine culture was positive for enterococcus. She has been on amoxicillin but is certainly had no fever or signs of polyp Fridays/urosepsis. She has been back for office on a couple occasions primarily due to severe pain/pain control issues. She is currently doing okay with just mild minimal discomfort. She remains on amoxicillin at this time. Urinalysis continues to show some microhematuria as well as pyuria.    I reviewed her original CT images as well as her KUB from 4 days ago. A KUB was repeated today. She appears to continue to have presence of a 4 x 6 mm stone in her proximal left ureter. The stone is reasonably from a and does not appear to be particularly dense.     Past Medical History Problems  1. History of Anxiety (300.00) 2. History of esophageal reflux (V12.79)  Surgical History Problems  1. History of Uterine Fibroid Embolization  Current Meds 1. Hydrocodone-Acetaminophen 5-325 MG Oral Tablet; Take 1 tablet every 4 hours;  Therapy: [id number] to (Evaluate:30Mar2015); Last Rx:20Mar2015 Ordered 2. Multi-Vitamin TABS;  Therapy: (Recorded:17Mar2015) to Recorded 3. OxyCODONE HCl - 5 MG Oral Capsule; TAKE 1 CAPSULE EVERY 4  TO 6 HOURS AS  NEEDED FOR BREAKTHROUGH PAIN;  Therapy: [id number] to (Evaluate:27Mar2015); Last Rx:17Mar2015 Ordered 4. Pantoprazole Sodium 20 MG Oral Tablet Delayed Release;  Therapy: (Recorded:17Mar2015) to Recorded  Allergies Medication  1. Sulfa Drugs  Family History Problems  1. Family history of  malignant neoplasm (V16.9) : Father 2. Family history of myocardial infarction (V17.3) : Father 3. Family history of Hematuria : Mother  Social History Problems  1. Alcohol use (V49.89) 2. Caffeine use (V49.89) 3. Death in the family, father   age 28 4. Former smoker (V15.82) 5. Married 6. Occupation   Research scientist (life sciences)  Review of Systems Genitourinary, constitutional, skin, eye, otolaryngeal, hematologic/lymphatic, cardiovascular, pulmonary, endocrine, musculoskeletal, gastrointestinal, neurological and psychiatric system(s) were reviewed and pertinent findings if present are noted.  Genitourinary: no dysuria and no hematuria.  Gastrointestinal: nausea, flank pain and abdominal pain.  ENT: sinus problems.  Musculoskeletal: back pain.    Vitals Vital Signs [Data Includes: Last 1 Day]  Recorded: [id number] 09:09AM  Blood Pressure: 148 / 89 Temperature: 98 F Heart Rate: 78  Physical Exam ENT:. The ears and nose are normal in appearance.  Pulmonary: No respiratory distress and normal respiratory rhythm and effort.  Cardiovascular: Heart rate and rhythm are normal . No peripheral edema.  Abdomen: The abdomen is soft and nontender. No masses are palpated. No CVA tenderness. No hernias are palpable. No hepatosplenomegaly noted.  Skin: Normal skin turgor, no visible rash and no visible skin lesions.  Neuro/Psych:. Mood and affect are appropriate.    Assessment  Assessed  1. Nephrolithiasis (592.0) 2. 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 Left ureteral calculus (592.1)  Plan Health Maintenance  1. UA With REFLEX; [Do Not Release]; Status:Complete;   Done: [id number] 09:00AM Left ureteral calculus  2. Start: Tamsulosin HCl - 0.4 MG Oral Capsule; TAKE 1 CAPSULE EVERY DAY 3. Follow-up Schedule Surgery Office  Follow-up  Status:  Complete  Done: [id number] Pyuria  4. Start: Hydrocodone-Acetaminophen 5-325 MG Oral Tablet; Take 1-2 tablets every 4-6  hours for pain 5. URINE CULTURE;  Status:In Progress - Specimen/Data Collected;   Done: 24Mar2015  Discussion/Summary Stacy Levitan is been diagnosed with a ureteral stone now for approximately 9 days. It does not appear that the stone was really progressed and she has been back on several occasions for pain. It appears that it would be quite reasonable proceed with intervention at this time. She did concur urinary tract infection but is really been asymptomatic and has been on appropriate therapy. I do believe it would be safe to proceed with ESWL. I think given the proximal location of the stone this would be treatment of choice. If this works well and she clears the stone we can talk about treatment or her upper pole stone burden. Hopefully we could proceed with a second ESWL.  Risks benefits potential complications discussed.   Signatures Electronically signed by : Rana Snare, M.D.; Aug 15 2013  9:44AM EST

## 2013-09-27 ENCOUNTER — Ambulatory Visit (INDEPENDENT_AMBULATORY_CARE_PROVIDER_SITE_OTHER): Payer: BC Managed Care – PPO

## 2013-09-27 VITALS — BP 123/81 | HR 77 | Resp 16 | Ht 62.0 in | Wt 140.0 lb

## 2013-09-27 DIAGNOSIS — Q828 Other specified congenital malformations of skin: Secondary | ICD-10-CM

## 2013-09-27 DIAGNOSIS — M779 Enthesopathy, unspecified: Secondary | ICD-10-CM

## 2013-09-27 DIAGNOSIS — M2010 Hallux valgus (acquired), unspecified foot: Secondary | ICD-10-CM

## 2013-09-27 DIAGNOSIS — M2040 Other hammer toe(s) (acquired), unspecified foot: Secondary | ICD-10-CM

## 2013-09-27 NOTE — Patient Instructions (Signed)
Corns and Calluses Corns are small areas of thickened skin that usually occur on the top, sides, or tip of a toe. They contain a cone-shaped core with a point that can press on a nerve below. This causes pain. Calluses are areas of thickened skin that usually develop on hands, fingers, palms, soles of the feet, and heels. These are areas that experience frequent friction or pressure. CAUSES  Corns are usually the result of rubbing (friction) or pressure from shoes that are too tight or do not fit properly. Calluses are caused by repeated friction and pressure on the affected areas. SYMPTOMS  A hard growth on the skin.  Pain or tenderness under the skin.  Sometimes, redness and swelling.  Increased discomfort while wearing tight-fitting shoes. DIAGNOSIS  Your caregiver can usually tell what the problem is by doing a physical exam. TREATMENT  Removing the cause of the friction or pressure is usually the only treatment needed. However, sometimes medicines can be used to help soften the hardened, thickened areas. These medicines include salicylic acid plasters and 12% ammonium lactate lotion. These medicines should only be used under the direction of your caregiver. HOME CARE INSTRUCTIONS   Try to remove pressure from the affected area.  You may wear donut-shaped corn pads to protect your skin.  You may use a pumice stone or nonmetallic nail file to gently reduce the thickness of a corn.  Wear properly fitted footwear.  If you have calluses on the hands, wear gloves during activities that cause friction.  If you have diabetes, you should regularly examine your feet. Tell your caregiver if you notice any problems with your feet. SEEK IMMEDIATE MEDICAL CARE IF:   You have increased pain, swelling, redness, or warmth in the affected area.  Your corn or callus starts to drain fluid or bleeds.  You are not getting better, even with treatment. Document Released: 02/13/2004 Document  Revised: 08/01/2011 Document Reviewed: 01/04/2011 [facility] Patient Information 2014 [place], [place].   Bunionectomy A bunionectomy is surgery to remove a bunion. A bunion is an enlargement of the joint at the base of the big toe. It is made up of bone and soft tissue on the inside part of the joint. Over time, a painful lump appears on the inside of the joint. The big toe begins to point inward toward the second toe. New bone growth can occur and a bone spur may form. The pain eventually causes difficulty walking. A bunion usually results from inflammation caused by the irritation of poorly fitting shoes. It often begins later in life. A bunionectomy is performed when nonsurgical treatment no longer works. When surgery is needed, the extent of the procedure will depend on the degree of deformity of the foot. Your surgeon will discuss with you the different procedures and what will work best for you depending on your age and health. LET YOUR CAREGIVER KNOW ABOUT:   Previous problems with anesthetics or medicines used to numb the skin.  Allergies to dyes, iodine, foods, and/or latex.  Medicines taken including herbs, eye drops, prescription medicines (especially medicines used to "thin the blood"), aspirin and other over-the-counter medicines, and steroids (by mouth or as a cream).  History of bleeding or blood problems.  Possibility of pregnancy, if this applies.  History of blood clots in your legs and/or lungs .  Previous surgery.  Other important health problems. RISKS AND COMPLICATIONS   Infection.  Pain.  Nerve damage.  Possibility that the bunion will recur. BEFORE THE PROCEDURE  You should be present 60 minutes prior to your procedure or as directed.  PROCEDURE  Surgery is often done so that you can go home the same day (outpatient). It may be done in a hospital or in an outpatient surgical center. An anesthetic will be used to help you sleep during the procedure. Sometimes,  a spinal anesthetic is used to make you numb below the waist. A cut (incision) is made over the swollen area at the first joint of the big toe. The enlarged lump will be removed. If there is a need to reposition the bones of the big toe, this may require more than 1 incision. The bone itself may need to be cut. Screws and wires may be used in the repair. These can be removed at a later date. In severe cases, the entire joint may need to be removed and a joint replacement inserted. When done, the incision is closed with stitches (sutures). Skin adhesive strips may be added for reinforcement. They help hold the incision closed.  AFTER THE PROCEDURE  Compression bandages (dressings) are then wrapped around the wound. This helps to keep the foot in alignment and reduce swelling. Your foot will be monitored for bleeding and swelling. You will need to stay for a few hours in the recovery area before being discharged. This allows time for the anesthesia to wear off. You will be discharged home when you are awake, stable, and doing well. HOME CARE INSTRUCTIONS   You can expect to return to normal activities within 6 to 8 weeks after surgery. The foot is at increased risk for swelling for several months. When you can expect to bear weight on the operated foot will depend on the extent of your surgery. The milder the deformity, the less tissue is removed and the sooner the return to normal activity level. During the recovery period, a special shoe, boot, or cast may be worn to accommodate the surgical bandage and to help provide stability to the foot.  Once you are home, an ice pack applied to the operative site may help with discomfort and keep swelling down. Stop using the ice if it causes discomfort.  Keep your feet raised (elevated) when possible to lessen swelling.  If you have an elastic bandage on your foot and you have numbness, tingling, or your foot becomes cold and blue, adjust the bandage to make it  comfortable.  Change dressings as directed.  Keep the wound dry and clean. The wound may be washed gently with soap and water. Gently blot dry without rubbing. Do not take baths or use swimming pools or hot tubs for 10 days, or as instructed by your caregiver.  Only take over-the-counter or prescription medicines for pain, discomfort, or fever as directed by your caregiver.  You may continue a normal diet as directed.  For activity, use crutches with no weight bearing or your orthopedic shoe as directed. Continue to use crutches or a cane as directed until you can stand without causing pain. SEEK MEDICAL CARE IF:   You have redness, swelling, bruising, or increasing pain in the wound.  There is pus coming from the wound.  You have drainage from a wound lasting longer than 1 day.  You have an oral temperature above 102 F (38.9 C).  You notice a bad smell coming from the wound or dressing.  The wound breaks open after sutures have been removed.  You develop dizzy episodes or fainting while standing.  You have persistent nausea  or vomiting.  Your toes become cold.  Pain is not relieved with medicines. SEEK IMMEDIATE MEDICAL CARE IF:   You develop a rash.  You have difficulty breathing.  You develop any reaction or side effects to medicines given.  Your toes are numb or blue, or you have severe pain. MAKE SURE YOU:   Understand these instructions.  Will watch your condition.  Will get help right away if you are not doing well or get worse. Document Released: 04/22/2005 Document Revised: 08/01/2011 Document Reviewed: 05/28/2007 [facility] Patient Information 2014 Stacy Rivas.

## 2013-09-27 NOTE — Progress Notes (Signed)
   Subjective:    Patient ID: Stacy Rivas, female    DOB: [birth year], 58 y.o.   MRN: [id number]  HPI Comments: "I have some pain in my feet"  Patient c/o aching plantar forefoot and 2nd toes bilateral for several months. The 1st and 2nd toes rub together and noticing a callused area starting to form on medial 2nd toes. She has tried different shoes and padding on the bottom of foot and between the toes. She likes to be active.  Foot Pain      Review of Systems  All other systems reviewed and are negative.      Objective:   Physical Exam Okay objective findings as follows vascular status is intact with pedal pulses palpable DP and PT posterior were for capillary refill time 3 seconds all digits. Epicritic and proprioceptive sensations intact and symmetric bilateral. Plantar response and DTRs noted. Dermatologically skin color pigment normal hair growth absent there is keratoses medial surfaces second digits bilateral to the lateral deviation of hallux and bunion deformities bilateral. Patient also hammertoe deformity second fifth digits bilateral with semirigid contracture. X-rays confirm contractures of toes and bunion deformity although not severe moderate causing pain discomfort patient also is very wide foot is currently wearing shoes are too narrow for her foot maintain wider shoes and may also be candidate for orthoses in the future.       Assessment & Plan:  Assessment this time capsulitis MTP joint second as well as associated for a keratoses second digits and fifth digits bilateral dispensed and tubercle padding and silicone pads and recommendations for cushioning also literature about bunions and hammertoe surgeries distributed and patient will consider to future on an as-needed basis on interim prior bunion surgery would be consideration of orthoses for the bunion hammertoe management as well as plantar fasciitis management. Followup in the future as needed  Harriet Masson  DPM

## 2014-04-15 ENCOUNTER — Other Ambulatory Visit: Payer: Self-pay | Admitting: Urology

## 2014-04-24 ENCOUNTER — Encounter (HOSPITAL_COMMUNITY): Payer: Self-pay

## 2014-04-24 NOTE — Progress Notes (Signed)
Patient does not have blue folder yet. Is calling office today

## 2014-04-28 ENCOUNTER — Encounter (HOSPITAL_COMMUNITY): Payer: Self-pay | Admitting: *Deleted

## 2014-04-28 ENCOUNTER — Encounter (HOSPITAL_COMMUNITY)
Admission: RE | Disposition: A | Discharge status: Home / Self Care | Payer: Self-pay | Source: Ambulatory Visit | Attending: Urology

## 2014-04-28 ENCOUNTER — Ambulatory Visit (HOSPITAL_COMMUNITY)
Admission: RE | Admit: 2014-04-28 | Discharge: 2014-04-28 | Disposition: A | Discharge status: Home / Self Care | Payer: BC Managed Care – PPO | Source: Ambulatory Visit | Attending: Urology | Admitting: Urology

## 2014-04-28 ENCOUNTER — Ambulatory Visit (HOSPITAL_COMMUNITY): Payer: BC Managed Care – PPO

## 2014-04-28 DIAGNOSIS — Z87891 Personal history of nicotine dependence: Secondary | ICD-10-CM | POA: Insufficient documentation

## 2014-04-28 DIAGNOSIS — Z8719 Personal history of other diseases of the digestive system: Secondary | ICD-10-CM | POA: Insufficient documentation

## 2014-04-28 DIAGNOSIS — Z882 Allergy status to sulfonamides status: Secondary | ICD-10-CM | POA: Insufficient documentation

## 2014-04-28 DIAGNOSIS — N201 Calculus of ureter: Secondary | ICD-10-CM | POA: Diagnosis present

## 2014-04-28 DIAGNOSIS — F419 Anxiety disorder, unspecified: Secondary | ICD-10-CM | POA: Insufficient documentation

## 2014-04-28 DIAGNOSIS — N200 Calculus of kidney: Secondary | ICD-10-CM | POA: Diagnosis not present

## 2014-04-28 SURGERY — LITHOTRIPSY, ESWL
Anesthesia: LOCAL | Laterality: Left

## 2014-04-28 MED ORDER — HYDROCODONE-ACETAMINOPHEN 5-325 MG PO TABS: 1.0000 | ORAL_TABLET | Freq: Four times a day (QID) | ORAL | Status: DC | PRN

## 2014-04-28 MED ORDER — DEXTROSE-NACL 5-0.45 % IV SOLN
INTRAVENOUS | Status: DC
  Administered 2014-04-28: 09:00:00 via INTRAVENOUS

## 2014-04-28 MED ORDER — CIPROFLOXACIN HCL 500 MG PO TABS
500.0000 mg | ORAL_TABLET | ORAL | Status: AC
  Administered 2014-04-28: 500 mg via ORAL
  Filled 2014-04-28: qty 1

## 2014-04-28 MED ORDER — DIPHENHYDRAMINE HCL 25 MG PO CAPS
25.0000 mg | ORAL_CAPSULE | ORAL | Status: AC
  Administered 2014-04-28: 25 mg via ORAL
  Filled 2014-04-28: qty 1

## 2014-04-28 MED ORDER — DIAZEPAM 5 MG PO TABS
10.0000 mg | ORAL_TABLET | ORAL | Status: AC
  Administered 2014-04-28: 10 mg via ORAL
  Filled 2014-04-28: qty 2

## 2014-04-28 NOTE — Interval H&P Note (Signed)
History and Physical Interval Note:  04/28/2014 10:34 AM  Stacy Rivas  has presented today for surgery, with the diagnosis of LEFT RENAL CALCULUS  The various methods of treatment have been discussed with the patient and family. After consideration of risks, benefits and other options for treatment, the patient has consented to  Procedure(Rivas): LEFT EXTRACORPOREAL SHOCK WAVE LITHOTRIPSY (ESWL) (Left) as a surgical intervention .  The patient'Rivas history has been reviewed, patient examined, no change in status, stable for surgery.  I have reviewed the patient'Rivas chart and labs.  Questions were answered to the patient'Rivas satisfaction.     Stacy Rivas

## 2014-04-28 NOTE — H&P (Signed)
History of Present Illness   Stacy Rivas returns for a routine follow-up visit. Main issue has been nephrolithiasis. In March 2015 she underwent ESWL for multiple stones the proximal left ureter. On subsequent follow-up ureteral stone burden appeared to have cleared. She is known to have significant stone burden in the upper pole left kidney. Estimated total stone of approximately 2 cm. Previous stone analysis showed mixture of calcium oxalate monohydrate and dihydrate composition. She is here for reassessment of her current stone burden. She remained asymptomatic. Urinalysis today is clear.   Past Medical History Problems  1. History of Anxiety (F41.9) 2. History of esophageal reflux (Z87.19)  Surgical History Problems  1. History of Lithotripsy 2. History of Uterine Fibroid Embolization  Current Meds 1. No Reported Medications  Requested for: [id number] Recorded  Allergies Medication  1. Sulfa Drugs  Family History Problems  1. Family history of malignant neoplasm (Z80.9) : Father 2. Family history of myocardial infarction (Z82.49) : Father 3. Family history of Hematuria : Mother  Social History Problems  1. Alcohol use (F10.99) 2. Caffeine use (F15.90) 3. Death in the family, father   age 63 4. Former smoker [phone]) 5. Married 6. Occupation   Research scientist (life sciences)  Review of Systems Genitourinary, constitutional, skin, eye, otolaryngeal, hematologic/lymphatic, cardiovascular, pulmonary, endocrine, musculoskeletal, gastrointestinal, neurological and psychiatric system(s) were reviewed and pertinent findings if present are noted and are otherwise negative.  Genitourinary: no dysuria and no hematuria.  Gastrointestinal: no flank pain and no abdominal pain.  ENT: sore throat.  Respiratory: cough.  Musculoskeletal: back pain.    Vitals Vital Signs [Data Includes: Last 1 Day]  Recorded: [id number] 08:14AM  Blood Pressure: 159 / 89 Temperature: 97.6 F Heart Rate:  73  Physical Exam Constitutional: Well nourished and well developed . No acute distress.  ENT:. The ears and nose are normal in appearance.  Neck: The appearance of the neck is normal and no neck mass is present.  Pulmonary: No respiratory distress and normal respiratory rhythm and effort.  Cardiovascular: Heart rate and rhythm are normal . No peripheral edema.  Abdomen: The abdomen is soft and nontender. No masses are palpated. No CVA tenderness. No hernias are palpable. No hepatosplenomegaly noted.  Skin: Normal skin turgor, no visible rash and no visible skin lesions.  Neuro/Psych:. Mood and affect are appropriate.    Assessment Assessed  1. Nephrolithiasis (N20.0)  Plan  Follow-up Schedule Surgery Office Follow-up Status: Hold For - Appointment Requested for: [phone] Ordered;  For: Bladder spasm, Health Maintenance, Left ureteral calculus, Nephrolithiasis, Pyuria; Ordered By: Rana Snare Performed:  Due: [id number] Marked Important   Discussion/Summary   KUB: There continues to be a significant stone burden in the upper pole of the left kidney. On CT, this was noted in the past to be 2 adjacent stones. On KUB imaging it is difficult to tell that the overall tell, but the overall length for the stone material is approximately 14 mL x approximately 10 mL. There does not appear to be a substantial change from her previous KUB 6 months ago in either size or location.   Stacy Rivas has had successful treatment for her proximal ureteral stone. She remains asymptomatic. She does have 2 significant stones in the upper pole of the left kidney. She is told it is impossible to determine when or if these stones will become clinically symptomatic. Given their upper pole location, they are more likely to migrate than they if they were in a lower pole. The overall size  of the stones combined, puts her in a borderline situation for ESWL, but the upper pole location does suggest a higher likelihood of  clearance versus a lower pole location. She did have good fragmentation with her previous ESWL and certainly elective lithotripsy for this stone is an option and does make some clinical sense. She is interested in going ahead and treating these stones. She understands there is a 15% to 20% chance that this may have to be staged and that if she does have considerable fragments, it is possible that she would require a second procedure. We will attempt to get something done before the end of the calendar year.

## 2014-04-28 NOTE — Discharge Instructions (Signed)
See [facility] discharge instructions in chart.  

## 2014-04-28 NOTE — Op Note (Signed)
See [facility] note scanned into chart. 

## 2014-06-30 ENCOUNTER — Other Ambulatory Visit: Payer: Self-pay

## 2014-06-30 DIAGNOSIS — Z1231 Encounter for screening mammogram for malignant neoplasm of breast: Secondary | ICD-10-CM

## 2014-07-01 ENCOUNTER — Encounter (INDEPENDENT_AMBULATORY_CARE_PROVIDER_SITE_OTHER): Payer: Self-pay

## 2014-07-01 ENCOUNTER — Encounter: Payer: Self-pay | Admitting: *Deleted

## 2014-07-01 ENCOUNTER — Ambulatory Visit
Admission: RE | Admit: 2014-07-01 | Discharge: 2014-07-01 | Disposition: A | Discharge status: Home / Self Care | Payer: BLUE CROSS/BLUE SHIELD | Source: Ambulatory Visit

## 2014-07-01 DIAGNOSIS — Z1231 Encounter for screening mammogram for malignant neoplasm of breast: Secondary | ICD-10-CM

## 2014-07-02 ENCOUNTER — Ambulatory Visit (INDEPENDENT_AMBULATORY_CARE_PROVIDER_SITE_OTHER): Payer: BLUE CROSS/BLUE SHIELD | Admitting: Internal Medicine

## 2014-07-02 ENCOUNTER — Encounter: Payer: Self-pay | Admitting: Internal Medicine

## 2014-07-02 VITALS — BP 140/80 | HR 88 | Ht 61.5 in | Wt 147.5 lb

## 2014-07-02 DIAGNOSIS — R0789 Other chest pain: Secondary | ICD-10-CM

## 2014-07-02 DIAGNOSIS — R1110 Vomiting, unspecified: Secondary | ICD-10-CM

## 2014-07-02 DIAGNOSIS — R112 Nausea with vomiting, unspecified: Secondary | ICD-10-CM

## 2014-07-02 DIAGNOSIS — IMO0001 Reserved for inherently not codable concepts without codable children: Secondary | ICD-10-CM

## 2014-07-02 DIAGNOSIS — Z1211 Encounter for screening for malignant neoplasm of colon: Secondary | ICD-10-CM

## 2014-07-02 DIAGNOSIS — K219 Gastro-esophageal reflux disease without esophagitis: Secondary | ICD-10-CM

## 2014-07-02 MED ORDER — SUCRALFATE 1 GM/10ML PO SUSP: ORAL | Status: DC

## 2014-07-02 MED ORDER — MOVIPREP 100 G PO SOLR: 1.0000 | Freq: Once | ORAL | Status: DC

## 2014-07-02 MED ORDER — PANTOPRAZOLE SODIUM 40 MG PO TBEC: 40.0000 mg | DELAYED_RELEASE_TABLET | Freq: Two times a day (BID) | ORAL | Status: DC

## 2014-07-02 NOTE — Patient Instructions (Addendum)
You have been scheduled for an endoscopy and colonoscopy. Please follow the written instructions given to you at your visit today. Please pick up your prep at the pharmacy within the next 1-3 days. If you use inhalers (even only as needed), please bring them with you on the day of your procedure. Your physician has requested that you go to www.startemmi.com and enter the access code given to you at your visit today. This web site gives a general overview about your procedure. However, you should still follow specific instructions given to you by our office regarding your preparation for the procedure.  We have sent the following medications to your pharmacy for you to pick up at your convenience: Pantoprazole 40 mg twice daily Carafate Slurry-three times daily (before meals and bedtime)  You have been scheduled for a Barium Esophogram at [facility] (1st floor of the hospital) on Monday 07/07/14 at 10:30 am. Please arrive 15 minutes prior to your appointment for registration. Make certain not to have anything to eat or drink 6 hours prior to your test. If you need to reschedule for any reason, please contact radiology at [phone] to do so. __________________________________________________________________ A barium swallow is an examination that concentrates on views of the esophagus. This tends to be a double contrast exam (barium and two liquids which, when combined, create a gas to distend the wall of the oesophagus) or single contrast (non-ionic iodine based). The study is usually tailored to your symptoms so a good history is essential. Attention is paid during the study to the form, structure and configuration of the esophagus, looking for functional disorders (such as aspiration, dysphagia, achalasia, motility and reflux) EXAMINATION You may be asked to change into a gown, depending on the type of swallow being performed. A radiologist and radiographer will perform the procedure. The  radiologist will advise you of the type of contrast selected for your procedure and direct you during the exam. You will be asked to stand, sit or lie in several different positions and to hold a small amount of fluid in your mouth before being asked to swallow while the imaging is performed .In some instances you may be asked to swallow barium coated marshmallows to assess the motility of a solid food bolus. The exam can be recorded as a digital or video fluoroscopy procedure. POST PROCEDURE It will take 1-2 days for the barium to pass through your system. To facilitate this, it is important, unless otherwise directed, to increase your fluids for the next 24-48hrs and to resume your normal diet.  This test typically takes about 30 minutes to perform. __________________________________________________________________________________ CC: Dr Reynaldo Minium

## 2014-07-02 NOTE — Progress Notes (Signed)
Patient ID: Stacy Rivas, female   DOB: August 07, 1955, 59 y.o.   MRN: [id number] HPI: Stacy Rivas is a 59 year old female with a past medical history of GERD, hiatal hernia, anxiety, kidney stones who is seen in consultation at the request of Dr. Reynaldo Minium to evaluate atypical chest pain and GERD. She is here alone today. She reports a history of acid reflux which was more severe approximately 10 years ago. She was seen by Dr. Sharlett Iles and had an upper endoscopy. She was told she had a hiatal hernia and also had a barium swallow around that time. Symptoms improved on Nexium and eventually she stopped PPI altogether. Over the last years she has used over-the-counter antacid medications on an as-needed basis. 2 weeks ago she developed a "lump in her throat" and overall felt poorly. She noticed decreased appetite and also burning discomfort in her chest in the sternal notch and near the xiphoid. This was worse with eating. She also felt increased burping and regurgitation. She denies nausea or vomiting. She's had no true dysphagia. She has been eating softer foods so as not to irritate the issue. Foods such as yogurt, eggs and toast. She denies fever. She's had some drainage/postnasal drip. Weight has been stable though perhaps down 2 pounds in the last 2 weeks. She reports normal bowel movements without blood in her stool or melena. When she becomes anxious she can have loose stools which is long-standing. She is worried about esophageal cancer and also notes a positive family history of gastric cancer in her paternal grandfather. Denies family history of colorectal cancer. Colonoscopy performed by Dr. Sharlett Iles in April 2006 was normal. Dr. Reynaldo Minium recommended pantoprazole 40 mg twice daily which she has been taking before meals for the past week. No dramatic improvement in symptoms to this point.    Past Medical History  Diagnosis Date  . Anxiety state, unspecified   . Hiatal hernia   . Esophageal reflux    . Nonspecific elevation of levels of transaminase or lactic acid dehydrogenase (LDH)   . IBS (irritable bowel syndrome)   . Renal stone 07/2013  . Fibroid tumor     Past Surgical History  Procedure Laterality Date  . Myomectomy  1992  . Laparoscopy      adhesions  . Cystoscopy with instillation for cystitis  2001  . Dilation and curettage of uterus      miscarriage  . Lithotripsy  07/2013    x 2    Outpatient Prescriptions Prior to Visit  Medication Sig Dispense Refill  . pantoprazole (PROTONIX) 40 MG tablet Take 40 mg by mouth 2 (two) times daily.     [name] Kitchen guaiFENesin (MUCINEX) 600 MG 12 hr tablet Take 600 mg by mouth 2 (two) times daily as needed for to loosen phlegm.    . Multiple Vitamin (MULTIVITAMIN WITH MINERALS) TABS tablet Take 1 tablet by mouth daily.    . pseudoephedrine-acetaminophen (TYLENOL SINUS) 30-500 MG TABS Take 1 tablet by mouth every 4 (four) hours as needed (cold symptoms).    [name] Kitchen HYDROcodone-acetaminophen (NORCO/VICODIN) 5-325 MG per tablet Take 1-2 tablets by mouth every 6 (six) hours as needed. 20 tablet 0   No facility-administered medications prior to visit.    Allergies  Allergen Reactions  . Shrimp [Shellfish Allergy]   . Sulfonamide Derivatives Nausea Only    Family History  Problem Relation Age of Onset  . Lymphoma Father   . Diabetes Maternal Grandmother   . Heart disease Father   . Stomach cancer  Paternal Grandfather     History  Substance Use Topics  . Smoking status: Former Smoker -- 10 years    Types: Cigarettes    Quit date: 05/23/1982  . Smokeless tobacco: Never Used  . Alcohol Use: 0.0 oz/week    0 Standard drinks or equivalent per week     Comment: 1/day    ROS: As per history of present illness, otherwise negative  BP 140/80 mmHg  Pulse 88  Ht 5' 1.5" (1.562 m)  Wt 147 lb 8 oz (66.906 kg)  BMI 27.42 kg/m2 Constitutional: Well-developed and well-nourished. No distress. HEENT: Normocephalic and atraumatic. Oropharynx is  clear and moist. No oropharyngeal exudate. Conjunctivae are normal.  No scleral icterus. Neck: Neck supple. Trachea midline. Cardiovascular: Normal rate, regular rhythm and intact distal pulses. No M/R/G Pulmonary/chest: Effort normal and breath sounds normal. No wheezing, rales or rhonchi. Abdominal: Soft, nontender, nondistended. Bowel sounds active throughout. There are no masses palpable. No hepatosplenomegaly. Extremities: no clubbing, cyanosis, or edema Lymphadenopathy: No cervical adenopathy noted. Neurological: Alert and oriented to person place and time. Skin: Skin is warm and dry. No rashes noted. Psychiatric: Normal mood and affect. Behavior is normal.  RELEVANT LABS AND IMAGING: CBC    Component Value Date/Time   WBC 12.5* 08/04/2013 0905   RBC 4.72 08/04/2013 0905   HGB 14.9 08/04/2013 0905   HCT 42.7 08/04/2013 0905   PLT 263 08/04/2013 0905   MCV 90.5 08/04/2013 0905   MCH 31.6 08/04/2013 0905   MCHC 34.9 08/04/2013 0905   RDW 12.7 08/04/2013 0905   LYMPHSABS 1.6 08/04/2013 0905   MONOABS 0.8 08/04/2013 0905   EOSABS 0.1 08/04/2013 0905   BASOSABS 0.0 08/04/2013 0905    CMP     Component Value Date/Time   NA 141 08/15/2013 0935   K 3.6* 08/15/2013 0935   CL 103 08/15/2013 0935   CO2 26 08/15/2013 0935   GLUCOSE 89 08/15/2013 0935   BUN 9 08/15/2013 0935   CREATININE 0.61 08/15/2013 0935   CALCIUM 9.7 08/15/2013 0935   PROT 7.1 05/29/2008 1547   ALBUMIN 4.1 05/29/2008 1547   AST 38* 05/29/2008 1547   ALT 51* 05/29/2008 1547   ALKPHOS 94 05/29/2008 1547   BILITOT 1.6* 05/29/2008 1547   GFRNONAA >90 08/15/2013 0935   GFRAA >90 08/15/2013 0935    ASSESSMENT/PLAN:  59 year old female with a past medical history of GERD, hiatal hernia, anxiety, kidney stones who is seen in consultation at the request of Dr. Reynaldo Minium to evaluate atypical chest pain and GERD.  1. GERD/atypical chest pain -- symptoms could be consistent with esophagitis and uncontrolled  reflux. She really has not been taking PPI on a consistent basis for very long, and I have recommended that she continue pantoprazole 40 mg twice daily. I will add sucralfate liquid before meals and at bedtime. She is very anxious about the possibility of cancer though I think this is very unlikely and I have provided reassurance today. Esophageal spasm or dysmotility is also possible. I have recommended barium swallow with tablet for further evaluation. We will also proceed to upper endoscopy given her long-standing history of GERD, anxiety about current symptoms. We discussed the test today including the risks and benefits and she is agreeable to proceed.  GERD diet  2. CRC screening -- colonoscopy last 10 years ago which was normal. Repeat colonoscopy recommended for screening at this time. It can be performed on the same day as her upper endoscopy. We discussed the risks  and benefits and she is agreeable to proceed.  I asked that she notify me if symptoms worsen prior to completing workup   CB:[id number] A Reynaldo Minium, [address] [place], [place] [address]

## 2014-07-07 ENCOUNTER — Ambulatory Visit (HOSPITAL_COMMUNITY)
Admission: RE | Admit: 2014-07-07 | Discharge: 2014-07-07 | Disposition: A | Discharge status: Home / Self Care | Payer: BLUE CROSS/BLUE SHIELD | Source: Ambulatory Visit | Attending: Internal Medicine | Admitting: Internal Medicine

## 2014-07-07 DIAGNOSIS — IMO0001 Reserved for inherently not codable concepts without codable children: Secondary | ICD-10-CM

## 2014-07-07 DIAGNOSIS — R0789 Other chest pain: Secondary | ICD-10-CM

## 2014-07-07 DIAGNOSIS — K449 Diaphragmatic hernia without obstruction or gangrene: Secondary | ICD-10-CM | POA: Diagnosis not present

## 2014-07-07 DIAGNOSIS — K219 Gastro-esophageal reflux disease without esophagitis: Secondary | ICD-10-CM | POA: Insufficient documentation

## 2014-07-07 DIAGNOSIS — R1110 Vomiting, unspecified: Secondary | ICD-10-CM

## 2014-07-07 DIAGNOSIS — Z1211 Encounter for screening for malignant neoplasm of colon: Secondary | ICD-10-CM

## 2014-07-10 ENCOUNTER — Encounter: Payer: Self-pay | Admitting: Internal Medicine

## 2014-07-10 ENCOUNTER — Ambulatory Visit (AMBULATORY_SURGERY_CENTER): Payer: BLUE CROSS/BLUE SHIELD | Admitting: Internal Medicine

## 2014-07-10 VITALS — BP 139/83 | HR 58 | Temp 98.2°F | Resp 24 | Ht 61.0 in | Wt 147.0 lb

## 2014-07-10 DIAGNOSIS — R0789 Other chest pain: Secondary | ICD-10-CM

## 2014-07-10 DIAGNOSIS — K219 Gastro-esophageal reflux disease without esophagitis: Secondary | ICD-10-CM

## 2014-07-10 DIAGNOSIS — Z1211 Encounter for screening for malignant neoplasm of colon: Secondary | ICD-10-CM

## 2014-07-10 DIAGNOSIS — D125 Benign neoplasm of sigmoid colon: Secondary | ICD-10-CM

## 2014-07-10 MED ORDER — SODIUM CHLORIDE 0.9 % IV SOLN: 500.0000 mL | INTRAVENOUS | Status: DC

## 2014-07-10 NOTE — Op Note (Signed)
[place]  [address] [place], [id number]   COLONOSCOPY PROCEDURE REPORT  PATIENT: Stacy, Rivas  MR#: [id number] BIRTHDATE: 07/25/1955 , 58  yrs. old GENDER: female ENDOSCOPIST: Jerene Bears, MD REFERRED EX:[id number] Reynaldo Minium, M.D. PROCEDURE DATE:  07/10/2014 PROCEDURE:   Colonoscopy with snare polypectomy First Screening Colonoscopy - Avg.  risk and is 50 yrs.  old or older - No.  Prior Negative Screening - Now for repeat screening. 10 or more years since last screening  History of Adenoma - Now for follow-up colonoscopy & has been > or = to 3 yrs.  N/A  Polyps Removed Today? Yes. ASA CLASS:   Class II INDICATIONS:average risk patient for colon cancer. MEDICATIONS: Monitored anesthesia care, Propofol 200 mg IV, and Residual sedation present  DESCRIPTION OF PROCEDURE:   After the risks benefits and alternatives of the procedure were thoroughly explained, informed consent was obtained.  The digital rectal exam revealed no rectal mass.   The LB [id number]  endoscope was introduced through the anus and advanced to the cecum, which was identified by both the appendix and ileocecal valve. No adverse events experienced. The quality of the prep was good, using MoviPrep  The instrument was then slowly withdrawn as the colon was fully examined.   COLON FINDINGS: A semi-pedunculated polyp measuring 8 mm in size was found in the sigmoid colon.  A polypectomy was performed using snare cautery.  The resection was complete, the polyp tissue was completely retrieved and sent to histology.   There was mild diverticulosis noted in the descending colon and sigmoid colon. Retroflexed views revealed small internal hemorrhoids. The time to cecum=5 minutes 54 seconds.  Withdrawal time=13 minutes 45 seconds. The scope was withdrawn and the procedure completed. COMPLICATIONS: There were no immediate complications.  ENDOSCOPIC IMPRESSION: 1.   Semi-pedunculated polyp was  found in the sigmoid colon; polypectomy was performed using snare cautery 2.   Mild diverticulosis was noted in the descending colon and sigmoid colon  RECOMMENDATIONS: 1.  Avoid all NSAIDs for the next 2 weeks. 2.  Await pathology results 3.  Repeat Colonoscopy in 5 years. 4.  You will receive a letter within 1-2 weeks with the results of your biopsy as well as final recommendations.  Please call my office if you have not received a letter after 3 weeks.  eSigned:  Jerene Bears, MD 07/10/2014 2:22 PM  cc: Burnard Bunting, MD and The Patient

## 2014-07-10 NOTE — Op Note (Signed)
[place]  [address]. [place], [id number]   ENDOSCOPY PROCEDURE REPORT  PATIENT: Stacy, Rivas  MR#: [id number] BIRTHDATE: 28-Nov-1955 , 58  yrs. old GENDER: female ENDOSCOPIST: Jerene Bears, MD REFERRED BY:  Burnard Bunting, M.D. PROCEDURE DATE:  07/10/2014 PROCEDURE:  EGD, diagnostic ASA CLASS:     Class II INDICATIONS:  history of GERD and chest pain. MEDICATIONS: Monitored anesthesia care and Propofol 100 mg IV TOPICAL ANESTHETIC: none  DESCRIPTION OF PROCEDURE: After the risks benefits and alternatives of the procedure were thoroughly explained, informed consent was obtained.  The LB [id number] endoscope was introduced through the mouth and advanced to the second portion of the duodenum , Without limitations.  The instrument was slowly withdrawn as the mucosa was fully examined.   ESOPHAGUS: The mucosa of the esophagus appeared normal.  Z-line regular at 35 cm.  STOMACH: A 4 cm hiatal hernia was noted.   A few small benign-appearing sessile polyps, fundic gland in nature, were found in the gastric fundus and cardia.   The stomach otherwise appeared normal.  DUODENUM: The duodenal mucosa showed no abnormalities in the bulb and 2nd part of the duodenum.  Retroflexed views revealed a hiatal hernia.     The scope was then withdrawn from the patient and the procedure completed.  COMPLICATIONS: There were no immediate complications.  ENDOSCOPIC IMPRESSION: 1.   The mucosa of the esophagus appeared normal 2.   4 cm hiatal hernia 3.   Few small sessile polyps were found in the gastric fundus and cardia 4.   The stomach otherwise appeared normal 5.   The duodenal mucosa showed no abnormalities in the bulb and 2nd part of the duodenum  RECOMMENDATIONS: 1.  Complete pantoprazole 40 mg twice daily for 1 month and then return to 40 mg once daily before breakfast.  Can use liquid sucralfate on an as-needed basis. 2.  Proceed with a  Colonoscopy.  eSigned:  Jerene Bears, MD 07/10/2014 2:19 PM  [id number] Reynaldo Minium, MD and The Patient

## 2014-07-10 NOTE — Progress Notes (Signed)
Called to room to assist during endoscopic procedure.  Patient ID and intended procedure confirmed with present staff. Received instructions for my participation in the procedure from the performing physician.  

## 2014-07-10 NOTE — Patient Instructions (Signed)
YOU HAD AN ENDOSCOPIC PROCEDURE TODAY AT THE [facility]: Refer to the procedure report that was given to you for any specific questions about what was found during the examination.  If the procedure report does not answer your questions, please call your gastroenterologist to clarify.  If you requested that your care partner not be given the details of your procedure findings, then the procedure report has been included in a sealed envelope for you to review at your convenience later.  YOU SHOULD EXPECT: Some feelings of bloating in the abdomen. Passage of more gas than usual.  Walking can help get rid of the air that was put into your GI tract during the procedure and reduce the bloating. If you had a lower endoscopy (such as a colonoscopy or flexible sigmoidoscopy) you may notice spotting of blood in your stool or on the toilet paper. If you underwent a bowel prep for your procedure, then you may not have a normal bowel movement for a few days.  DIET: Your first meal following the procedure should be a light meal and then it is ok to progress to your normal diet.  A half-sandwich or bowl of soup is an example of a good first meal.  Heavy or fried foods are harder to digest and may make you feel nauseous or bloated.  Likewise meals heavy in dairy and vegetables can cause extra gas to form and this can also increase the bloating.  Drink plenty of fluids but you should avoid alcoholic beverages for 24 hours.  ACTIVITY: Your care partner should take you home directly after the procedure.  You should plan to take it easy, moving slowly for the rest of the day.  You can resume normal activity the day after the procedure however you should NOT DRIVE or use heavy machinery for 24 hours (because of the sedation medicines used during the test).    SYMPTOMS TO REPORT IMMEDIATELY: A gastroenterologist can be reached at any hour.  During normal business hours, 8:30 AM to 5:00 PM Monday through Friday,  call [phone].  After hours and on weekends, please call the GI answering service at [phone] who will take a message and have the physician on call contact you.   Following lower endoscopy (colonoscopy or flexible sigmoidoscopy):  Excessive amounts of blood in the stool  Significant tenderness or worsening of abdominal pains  Swelling of the abdomen that is new, acute  Fever of 100F or higher  Following upper endoscopy (EGD)  Vomiting of blood or coffee ground material  New chest pain or pain under the shoulder blades  Painful or persistently difficult swallowing  New shortness of breath  Fever of 100F or higher  Black, tarry-looking stools  FOLLOW UP: If any biopsies were taken you will be contacted by phone or by letter within the next 1-3 weeks.  Call your gastroenterologist if you have not heard about the biopsies in 3 weeks.  Our staff will call the home number listed on your records the next business day following your procedure to check on you and address any questions or concerns that you may have at that time regarding the information given to you following your procedure. This is a courtesy call and so if there is no answer at the home number and we have not heard from you through the emergency physician on call, we will assume that you have returned to your regular daily activities without incident.  SIGNATURES/CONFIDENTIALITY: You and/or your care   partner have signed paperwork which will be entered into your electronic medical record.  These signatures attest to the fact that that the information above on your After Visit Summary has been reviewed and is understood.  Full responsibility of the confidentiality of this discharge information lies with you and/or your care-partner.  Please read over handouts about hiatal hernia, polyps, diverticulosis, and high fiber diets  Avoid NSAIDS (Ibuprofen, Motrin, Advil, Aleve) for 2 weeks- Tylenol is ok  You might note  some irritation in your nose or some drainage.  This may cause feelings of congestion.  This is from the oxygen, which can be irritating.  There is no need for concern, this should clear up in a day or so.  Please follow Dr Vena Rua recommendations about Pantoprazole

## 2014-07-10 NOTE — Progress Notes (Signed)
Patient awakening,vss,report to rn 

## 2014-07-11 ENCOUNTER — Telehealth: Payer: Self-pay

## 2014-07-11 NOTE — Telephone Encounter (Signed)
  Follow up Call-  Call back number 07/10/2014  Post procedure Call Back phone  # [phone]  Permission to leave phone message Yes     Patient questions:  Do you have a fever, pain , or abdominal swelling? No. Pain Score  0 *  Have you tolerated food without any problems? Yes.    Have you been able to return to your normal activities? Yes.    Do you have any questions about your discharge instructions: Diet   No. Medications  No. Follow up visit  No.  Do you have questions or concerns about your Care? No.  Actions: * If pain score is 4 or above: No action needed, pain <4. Patient on inside of lip noted a bruised area where mouth piece was placed. Instructed to chew and swallow on opposite side to help aid in healing. But to call back if needed.

## 2014-07-17 ENCOUNTER — Encounter: Payer: Self-pay | Admitting: Internal Medicine

## 2015-06-08 ENCOUNTER — Other Ambulatory Visit: Payer: Self-pay | Admitting: Obstetrics & Gynecology

## 2015-06-09 LAB — CYTOLOGY - PAP

## 2015-07-14 ENCOUNTER — Other Ambulatory Visit: Payer: Self-pay

## 2015-07-14 DIAGNOSIS — Z1231 Encounter for screening mammogram for malignant neoplasm of breast: Secondary | ICD-10-CM

## 2015-07-27 ENCOUNTER — Ambulatory Visit
Admission: RE | Admit: 2015-07-27 | Discharge: 2015-07-27 | Disposition: A | Discharge status: Home / Self Care | Payer: BLUE CROSS/BLUE SHIELD | Source: Ambulatory Visit

## 2015-07-27 DIAGNOSIS — Z1231 Encounter for screening mammogram for malignant neoplasm of breast: Secondary | ICD-10-CM

## 2015-07-30 ENCOUNTER — Other Ambulatory Visit: Payer: Self-pay | Admitting: Internal Medicine

## 2015-07-30 DIAGNOSIS — R928 Other abnormal and inconclusive findings on diagnostic imaging of breast: Secondary | ICD-10-CM

## 2015-08-05 ENCOUNTER — Ambulatory Visit
Admission: RE | Admit: 2015-08-05 | Discharge: 2015-08-05 | Disposition: A | Discharge status: Home / Self Care | Payer: BLUE CROSS/BLUE SHIELD | Source: Ambulatory Visit | Attending: Internal Medicine | Admitting: Internal Medicine

## 2015-08-05 DIAGNOSIS — R928 Other abnormal and inconclusive findings on diagnostic imaging of breast: Secondary | ICD-10-CM

## 2015-10-12 ENCOUNTER — Ambulatory Visit
Admission: RE | Admit: 2015-10-12 | Discharge: 2015-10-12 | Disposition: A | Discharge status: Home / Self Care | Payer: BLUE CROSS/BLUE SHIELD | Source: Ambulatory Visit | Attending: Internal Medicine | Admitting: Internal Medicine

## 2015-10-12 ENCOUNTER — Other Ambulatory Visit: Payer: Self-pay | Admitting: Internal Medicine

## 2015-10-12 DIAGNOSIS — M79605 Pain in left leg: Secondary | ICD-10-CM

## 2015-10-12 DIAGNOSIS — M7989 Other specified soft tissue disorders: Secondary | ICD-10-CM

## 2015-10-12 DIAGNOSIS — I809 Phlebitis and thrombophlebitis of unspecified site: Secondary | ICD-10-CM

## 2015-11-23 ENCOUNTER — Other Ambulatory Visit: Payer: Self-pay | Admitting: Internal Medicine

## 2015-11-23 DIAGNOSIS — M79662 Pain in left lower leg: Secondary | ICD-10-CM

## 2015-11-23 DIAGNOSIS — R609 Edema, unspecified: Secondary | ICD-10-CM

## 2015-11-26 ENCOUNTER — Ambulatory Visit
Admission: RE | Admit: 2015-11-26 | Discharge: 2015-11-26 | Disposition: A | Discharge status: Home / Self Care | Payer: BLUE CROSS/BLUE SHIELD | Source: Ambulatory Visit | Attending: Internal Medicine | Admitting: Internal Medicine

## 2015-11-26 DIAGNOSIS — R609 Edema, unspecified: Secondary | ICD-10-CM

## 2015-11-26 DIAGNOSIS — M79662 Pain in left lower leg: Secondary | ICD-10-CM

## 2015-12-08 ENCOUNTER — Other Ambulatory Visit: Payer: Self-pay | Admitting: *Deleted

## 2015-12-08 DIAGNOSIS — I8002 Phlebitis and thrombophlebitis of superficial vessels of left lower extremity: Secondary | ICD-10-CM

## 2015-12-10 ENCOUNTER — Encounter: Payer: Self-pay | Admitting: Vascular Surgery

## 2015-12-31 ENCOUNTER — Encounter: Payer: Self-pay | Admitting: Vascular Surgery

## 2016-01-04 ENCOUNTER — Ambulatory Visit (HOSPITAL_COMMUNITY)
Admission: RE | Admit: 2016-01-04 | Discharge: 2016-01-04 | Disposition: A | Discharge status: Home / Self Care | Payer: BLUE CROSS/BLUE SHIELD | Source: Ambulatory Visit | Attending: Vascular Surgery | Admitting: Vascular Surgery

## 2016-01-04 DIAGNOSIS — E785 Hyperlipidemia, unspecified: Secondary | ICD-10-CM | POA: Insufficient documentation

## 2016-01-04 DIAGNOSIS — F419 Anxiety disorder, unspecified: Secondary | ICD-10-CM | POA: Insufficient documentation

## 2016-01-04 DIAGNOSIS — K219 Gastro-esophageal reflux disease without esophagitis: Secondary | ICD-10-CM | POA: Insufficient documentation

## 2016-01-04 DIAGNOSIS — I8002 Phlebitis and thrombophlebitis of superficial vessels of left lower extremity: Secondary | ICD-10-CM | POA: Diagnosis not present

## 2016-01-05 ENCOUNTER — Encounter: Payer: Self-pay | Admitting: Vascular Surgery

## 2016-01-05 ENCOUNTER — Ambulatory Visit (INDEPENDENT_AMBULATORY_CARE_PROVIDER_SITE_OTHER): Payer: BLUE CROSS/BLUE SHIELD | Admitting: Vascular Surgery

## 2016-01-05 VITALS — BP 131/75 | HR 71 | Ht 61.0 in | Wt 153.0 lb

## 2016-01-05 DIAGNOSIS — I809 Phlebitis and thrombophlebitis of unspecified site: Secondary | ICD-10-CM | POA: Diagnosis not present

## 2016-01-05 NOTE — Progress Notes (Signed)
Patient name: Stacy Rivas MRN: [id number] DOB: 1955/06/30 Sex: female  REASON FOR CONSULT: Left leg superficial thrombophlebitis  HPI: Stacy Rivas is a 60 y.o. female, seen today for evaluation of a recent episode of superficial thrombophlebitis. In May of this year she noted that have a redness swelling and erythema over the medial aspect of her distal left thigh. This was a quite painful for her and she presented to emergency treatment for further evaluation. She was found to have superficial thrombus in her left great saphenous vein at the level just above her knee. She was placed on the course of oral antibiotics and aspirin therapy. This persisted have continued pain and underwent repeat duplex in 11/26/2015 and this did show resolution of her thrombus. No evidence of DVT and no prior history of DVT. This did occur after a 5 hour airline flight on the initial episode in May of this year. Her past history is negative for any cardiac disease. No family history of clotting disorder.  Past Medical History:  Diagnosis Date  . Anxiety state, unspecified   . Esophageal reflux   . Fibroid tumor   . Hiatal hernia   . Hyperlipidemia   . IBS (irritable bowel syndrome)   . Nonspecific elevation of levels of transaminase or lactic acid dehydrogenase (LDH)   . Renal stone 07/2013  . Varicose veins    superficail thrombophlebitis (left LE)    Family History  Problem Relation Age of Onset  . Lymphoma Father   . Heart disease Father   . Hypertension Father   . Hypertension Mother   . Hyperlipidemia Mother   . Diabetes Maternal Grandmother   . Stomach cancer Paternal Grandfather     SOCIAL HISTORY: Social History   Social History  . Marital status: Married    Spouse name: N/A  . Number of children: 0  . Years of education: N/A   Occupational History  . sales    Social History Main Topics  . Smoking status: Former Smoker    Years: 10.00    Types: Cigarettes    Quit date:  05/23/1980  . Smokeless tobacco: Never Used  . Alcohol use 0.0 oz/week     Comment: 2/day  . Drug use: No  . Sexual activity: Not on file     Comment: Qit smoking 1984   Other Topics Concern  . Not on file   Social History Narrative  . No narrative on file    Allergies  Allergen Reactions  . Shrimp [Shellfish Allergy]   . Sulfonamide Derivatives Nausea Only    Current Outpatient Prescriptions  Medication Sig Dispense Refill  . Cholecalciferol (VITAMIN D3) 1000 UNITS CAPS Take 1 capsule by mouth daily.    . Multiple Vitamin (MULTIVITAMIN WITH MINERALS) TABS tablet Take 1 tablet by mouth daily.    . pantoprazole (PROTONIX) 40 MG tablet Take 1 tablet (40 mg total) by mouth 2 (two) times daily. 60 tablet 2  . guaiFENesin (MUCINEX) 600 MG 12 hr tablet Take 600 mg by mouth 2 (two) times daily as needed for to loosen phlegm.    . pseudoephedrine-acetaminophen (TYLENOL SINUS) 30-500 MG TABS Take 1 tablet by mouth every 4 (four) hours as needed (cold symptoms).    . sucralfate (CARAFATE) 1 GM/10ML suspension Take 10 ml three times daily with meals and at bedtime (Patient not taking: Reported on 01/05/2016) 420 mL 1   No current facility-administered medications for this visit.     REVIEW OF SYSTEMS:  [  X] denotes positive finding, [ ]  denotes negative finding Cardiac  Comments:  Chest pain or chest pressure:    Shortness of breath upon exertion:    Short of breath when lying flat:    Irregular heart rhythm:        Vascular    Pain in calf, thigh, or hip brought on by ambulation:    Pain in feet at night that wakes you up from your sleep:     Blood clot in your veins: x   Leg swelling:         Pulmonary    Oxygen at home:    Productive cough:     Wheezing:         Neurologic    Sudden weakness in arms or legs:     Sudden numbness in arms or legs:     Sudden onset of difficulty speaking or slurred speech:    Temporary loss of vision in one eye:     Problems with dizziness:          Gastrointestinal    Blood in stool:     Vomited blood:         Genitourinary    Burning when urinating:     Blood in urine:        Psychiatric    Major depression:         Hematologic    Bleeding problems:    Problems with blood clotting too easily:        Skin    Rashes or ulcers:        Constitutional    Fever or chills:      PHYSICAL EXAM: Vitals:   01/05/16 0931  BP: 131/75  Pulse: 71  SpO2: 100%  Weight: 153 lb (69.4 kg)  Height: 5\' 1"  (1.549 m)    GENERAL: The patient is a well-nourished female, in no acute distress. The vital signs are documented above. VASCULAR: 2+ dorsalis pedis pulses bilaterally PULMONARY: There is good air exchange  MUSCULOSKELETAL: There are no major deformities or cyanosis. NEUROLOGIC: No focal weakness or paresthesias are detected. SKIN: There are no ulcers or rashes noted. PSYCHIATRIC: The patient has a normal affect. She does have scattered telangiectasia over both legs mainly above her knee in the anterior thighs bilaterally. No tenderness over the saphenous vein in her left medial distal thigh where she had had the thrombus and no palpable thrombus present   DATA:   She did undergo left leg reflux study in our office today. This shows a completely normal exam. There is no evidence of deep or superficial clot and no evidence of deep or superficial reflux.  MEDICAL ISSUES:  Had long discussion with patient regarding her recent episode of superficial thrombophlebitis. Explained that this was treated appropriately. Explained that this is rarely related to infection but does appear to be infected from a clinical standpoint due to the erythema. She had questions regarding airline flight. I recommended that she wear her compression garments if these are not a large burden for her. She did seem to tolerate these in the past. Did explain the importance of Muscle pump function while in an airplane and also walking in the walls every  1-2 hours for long flights as well. She was reassured with this discussion will see [place] again on an as-needed basis   Cass Edinger [facility] [employer] [facility] [phone]

## 2016-01-08 ENCOUNTER — Ambulatory Visit (HOSPITAL_COMMUNITY): Payer: BLUE CROSS/BLUE SHIELD

## 2016-04-05 ENCOUNTER — Other Ambulatory Visit: Payer: Self-pay | Admitting: Internal Medicine

## 2016-04-05 DIAGNOSIS — N6011 Diffuse cystic mastopathy of right breast: Secondary | ICD-10-CM

## 2016-04-13 ENCOUNTER — Ambulatory Visit
Admission: RE | Admit: 2016-04-13 | Discharge: 2016-04-13 | Disposition: A | Discharge status: Home / Self Care | Payer: BLUE CROSS/BLUE SHIELD | Source: Ambulatory Visit | Attending: Internal Medicine | Admitting: Internal Medicine

## 2016-04-13 DIAGNOSIS — N6011 Diffuse cystic mastopathy of right breast: Secondary | ICD-10-CM

## 2016-10-27 ENCOUNTER — Encounter: Payer: Self-pay | Admitting: Nurse Practitioner

## 2016-10-27 ENCOUNTER — Ambulatory Visit (INDEPENDENT_AMBULATORY_CARE_PROVIDER_SITE_OTHER): Payer: Managed Care, Other (non HMO) | Admitting: Nurse Practitioner

## 2016-10-27 VITALS — BP 122/78 | HR 72 | Ht 62.0 in | Wt 145.0 lb

## 2016-10-27 DIAGNOSIS — K219 Gastro-esophageal reflux disease without esophagitis: Secondary | ICD-10-CM

## 2016-10-27 MED ORDER — RANITIDINE HCL 150 MG PO TABS: 150.0000 mg | ORAL_TABLET | Freq: Every day | ORAL | 3 refills | Status: DC

## 2016-10-27 NOTE — Progress Notes (Addendum)
     HPI: Patient is a 61 year old female known to Dr. Hilarie Fredrickson. She has a history of GERD. 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 Remainder of exam was normal.   Patient comes in today with breakthrough GERD symptoms on daily Protonix. She has been trying to lose weight and started eating more citric fruits. . Approximately a month ago she developed heartburn and recurrent globus sensation. Last weekend she made cut back on citric foods and increased her PPI to twice daily for a few days. Her GERD symptoms are about 75% better now. She is back to daily dosing of her Protonix. Her mattress at home inclines but she travels a lot.  She would like to talk about getting off Protonix. She has been on it for years.     Past Medical History:  Diagnosis Date  . Anxiety state, unspecified   . Esophageal reflux   . Fibroid tumor   . Hiatal hernia   . Hyperlipidemia   . IBS (irritable bowel syndrome)   . Nonspecific elevation of levels of transaminase or lactic acid dehydrogenase (LDH)   . Renal stone 07/2013  . Varicose veins    superficail thrombophlebitis (left LE)    Patient's surgical history, family medical history, social history, medications and allergies were all reviewed in Epic    Physical Exam: Ht 5\' 2"  (1.575 m)   Wt 145 lb (65.8 kg)   BMI 26.52 kg/m   GENERAL: well developed white female in NAD PSYCH: :Pleasant, cooperative, normal affect EENT:  conjunctiva pink, mucous membranes moist, neck supple without masses CARDIAC:  RRR, no murmur heard, no peripheral edema PULM: Normal respiratory effort, lungs CTA bilaterally, no wheezing ABDOMEN:  soft, nontender, nondistended, no obvious masses, no hepatomegaly,  normal bowel sounds SKIN:  turgor, no lesions seen Musculoskeletal:  Normal muscle tone, normal strength NEURO: Alert and oriented  x 3, no focal neurologic deficits   ASSESSMENT and PLAN:  1. Pleasant 61 year old female with chronic GERD / 4 cm hiatal hernia.. Recent occurrence of heartburn and globus after changing diet to include more citric fruits. After reducing citric fruits and increasing her PPI to BID for a couple of day GERD symptoms are now about 75% better. She has taken Protonix for years and would really like to get off from it.  -If she is to get off PPI then strict anti-reflux measures will need to be followed.[name] Kitchen -GERD literature given -Patient's home mattress inclines but she travels a lot for work. I recommended a wedge pillow and there are inflatable ones available online -Per her request we will stop the Protonix for now. I think she needs to be on something, at least until back to feeling 100%. Try Zantac 150 mg before dinner since her symptoms seem to be worse at night. If the Zantac does not carry her through the following day then we can possibly divide the dose into 75 mg twice a day. If still symptomatic then I suspect she will need to resume PPI therapy -Patient will call us in a few weeks with a condition update. At that time we can make adjustments to her GERD medication if needed.    Tye Savoy , NP 10/27/2016, 10:42 AM   Addendum: Reviewed and agree with initial management. Pyrtle, Lajuan Lines, MD

## 2016-10-27 NOTE — Patient Instructions (Signed)
If you are age 63 or older, your body mass index should be between 23-30. Your Body mass index is 26.52 kg/m. If this is out of the aforementioned range listed, please consider follow up with your Primary Care Provider.  If you are age 25 or younger, your body mass index should be between 19-25. Your Body mass index is 26.52 kg/m. If this is out of the aformentioned range listed, please consider follow up with your Primary Care Provider.   We have sent the following medications to your pharmacy for you to pick up at your convenience: Zantac 150 mg one tablet before dinner.  STOP Protonix.  You have been given GERD Literature and Strict anti-reflux precautions.  Get inflatable wedge pillow.  Call in a few weeks with an update.  Thank you for choosing me and [facility].   Tye Savoy, NP

## 2016-11-29 ENCOUNTER — Other Ambulatory Visit: Payer: Self-pay

## 2016-11-29 MED ORDER — RANITIDINE HCL 150 MG PO TABS: 150.0000 mg | ORAL_TABLET | Freq: Every day | ORAL | 1 refills | Status: DC

## 2016-11-29 NOTE — Telephone Encounter (Signed)
Pharmacy requested 90-day-supply.  Prescription escribed for 90-day-supply.

## 2016-12-12 ENCOUNTER — Other Ambulatory Visit: Payer: Self-pay | Admitting: Internal Medicine

## 2016-12-12 DIAGNOSIS — N63 Unspecified lump in unspecified breast: Secondary | ICD-10-CM

## 2016-12-27 ENCOUNTER — Ambulatory Visit
Admission: RE | Admit: 2016-12-27 | Discharge: 2016-12-27 | Disposition: A | Discharge status: Home / Self Care | Payer: Managed Care, Other (non HMO) | Source: Ambulatory Visit | Attending: Internal Medicine | Admitting: Internal Medicine

## 2016-12-27 DIAGNOSIS — N63 Unspecified lump in unspecified breast: Secondary | ICD-10-CM

## 2017-01-30 DIAGNOSIS — H11823 Conjunctivochalasis, bilateral: Secondary | ICD-10-CM | POA: Insufficient documentation

## 2017-01-30 DIAGNOSIS — H02833 Dermatochalasis of right eye, unspecified eyelid: Secondary | ICD-10-CM | POA: Insufficient documentation

## 2017-01-30 DIAGNOSIS — H524 Presbyopia: Secondary | ICD-10-CM | POA: Insufficient documentation

## 2017-01-30 DIAGNOSIS — H25813 Combined forms of age-related cataract, bilateral: Secondary | ICD-10-CM | POA: Insufficient documentation

## 2017-01-30 DIAGNOSIS — H26493 Other secondary cataract, bilateral: Secondary | ICD-10-CM | POA: Insufficient documentation

## 2017-01-30 DIAGNOSIS — H04123 Dry eye syndrome of bilateral lacrimal glands: Secondary | ICD-10-CM | POA: Insufficient documentation

## 2017-01-30 DIAGNOSIS — H02836 Dermatochalasis of left eye, unspecified eyelid: Secondary | ICD-10-CM | POA: Insufficient documentation

## 2017-01-30 DIAGNOSIS — H43393 Other vitreous opacities, bilateral: Secondary | ICD-10-CM | POA: Insufficient documentation

## 2017-02-28 DIAGNOSIS — D259 Leiomyoma of uterus, unspecified: Secondary | ICD-10-CM | POA: Insufficient documentation

## 2017-07-25 ENCOUNTER — Other Ambulatory Visit: Payer: Self-pay | Admitting: Nurse Practitioner

## 2017-07-29 ENCOUNTER — Encounter: Payer: Self-pay | Admitting: Internal Medicine

## 2017-08-02 ENCOUNTER — Ambulatory Visit (INDEPENDENT_AMBULATORY_CARE_PROVIDER_SITE_OTHER): Payer: Managed Care, Other (non HMO) | Admitting: Physician Assistant

## 2017-08-02 ENCOUNTER — Encounter (INDEPENDENT_AMBULATORY_CARE_PROVIDER_SITE_OTHER): Payer: Self-pay

## 2017-08-02 ENCOUNTER — Encounter: Payer: Self-pay | Admitting: Physician Assistant

## 2017-08-02 VITALS — BP 128/86 | HR 88 | Ht 61.5 in | Wt 145.1 lb

## 2017-08-02 DIAGNOSIS — Z86010 Personal history of colonic polyps: Secondary | ICD-10-CM

## 2017-08-02 DIAGNOSIS — K219 Gastro-esophageal reflux disease without esophagitis: Secondary | ICD-10-CM

## 2017-08-02 MED ORDER — NA SULFATE-K SULFATE-MG SULF 17.5-3.13-1.6 GM/177ML PO SOLN: 1.0000 | ORAL | 0 refills | Status: DC

## 2017-08-02 MED ORDER — RANITIDINE HCL 75 MG PO TABS: 75.0000 mg | ORAL_TABLET | Freq: Two times a day (BID) | ORAL | 4 refills | Status: DC

## 2017-08-02 NOTE — Progress Notes (Addendum)
Chief Complaint: Dysphagia  HPI:    Stacy Rivas is a 62 year old female known to Dr. Hilarie Fredrickson, with a history of reflux, who was referred to me by Burnard Bunting, MD for a complaint of dysphagia.     Last colonoscopy 07/10/14 with small pedunculated polyp in the sigmoid colon, mild diverticulosis in the descending and sigmoid colon.  Pathology showed tubulovillous adenoma.  Repeat recommended in 3 years.      Last OV 10/27/16, previous EGD February 2016 revealed a 4 cm hiatal hernia, few small benign-appearing sessile polyps in the gastric fundus and cardia and the remainder exam is normal.  Describe breakthrough reflux symptoms on daily Protonix.  Had increased her PPI to twice daily for a couple of days and her GERD symptoms are 75% better.  Also thought related to citric food she was eating at that time.  At that time her Protonix was stopped per her request and patient was tried on Zantac 150 mg before dinner.  It was thought she could divide this dose to 75 mg twice daily if not helpful.    Today, explains that she did stop her Pantoprazole and in fact lost around 10-12 pounds last year and did not have to take any medication for a while, but since November when she started with a sinus infection and a lot of drainage has been experiencing reflux symptoms which are worse at night.  Also describes "gurgling" up into her throat and a pressure that radiates up the left side of her neck.  Drinking water is making her burp over the past month or so.  Patient did restart her Pantoprazole 40 mg daily and has been on this for the past week which has been helping with all of her symptoms.  She is about 75% better at this point.  Patient is nervous regarding her grandfather's history of stomach cancer.    Patient also reminds me about her polyp history of need for colonoscopy.    Denies fever, chills, weight loss, anorexia, nausea, vomiting or abdominal pain.  Past Medical History:  Diagnosis Date  .  Anxiety state, unspecified   . Esophageal reflux   . Fibroid tumor   . Hiatal hernia   . Hyperlipidemia   . IBS (irritable bowel syndrome)   . Nonspecific elevation of levels of transaminase or lactic acid dehydrogenase (LDH)   . Renal stone 07/2013  . Varicose veins    superficail thrombophlebitis (left LE)    Past Surgical History:  Procedure Laterality Date  . cystoscopy with instillation for cystitis  2001  . DILATION AND CURETTAGE OF UTERUS     miscarriage  . LAPAROSCOPY     adhesions  . LITHOTRIPSY  07/2013   x 2  . MYOMECTOMY  1992    Current Outpatient Medications  Medication Sig Dispense Refill  . Cholecalciferol (VITAMIN D3) 1000 UNITS CAPS Take 1 capsule by mouth daily.    . Multiple Vitamin (MULTIVITAMIN WITH MINERALS) TABS tablet Take 1 tablet by mouth daily.    . ranitidine (ZANTAC) 150 MG tablet TAKE 1 TABLET (150 MG TOTAL) BY MOUTH DAILY. BEFORE DINNER 90 tablet 1   No current facility-administered medications for this visit.     Allergies as of 08/02/2017 - Review Complete 10/27/2016  Allergen Reaction Noted  . Shrimp [shellfish allergy]  07/02/2014  . Sulfonamide derivatives Nausea Only     Family History  Problem Relation Age of Onset  . Lymphoma Father   . Heart disease Father   .  Hypertension Father   . Hypertension Mother   . Hyperlipidemia Mother   . Diabetes Maternal Grandmother   . Stomach cancer Paternal Grandfather   . Colon cancer Neg Hx     Social History   Socioeconomic History  . Marital status: Married    Spouse name: Not on file  . Number of children: 0  . Years of education: Not on file  . Highest education level: Not on file  Social Needs  . Financial resource strain: Not on file  . Food insecurity - worry: Not on file  . Food insecurity - inability: Not on file  . Transportation needs - medical: Not on file  . Transportation needs - non-medical: Not on file  Occupational History  . Occupation: Press photographer  Tobacco Use  .  Smoking status: Former Smoker    Years: 10.00    Types: Cigarettes    Last attempt to quit: 05/23/1980    Years since quitting: 37.2  . Smokeless tobacco: Never Used  . Tobacco comment: quit 1984  Substance and Sexual Activity  . Alcohol use: Yes    Alcohol/week: 0.0 oz    Comment: 2/day  . Drug use: No  . Sexual activity: Yes    Partners: Male  Other Topics Concern  . Not on file  Social History Narrative  . Not on file    Review of Systems:    Constitutional: No weight loss, fever or chills Cardiovascular: No chest pain  Respiratory: No SOB  Gastrointestinal: See HPI and otherwise negative   Physical Exam:  Vital signs: BP 128/86 (BP Location: Left Arm, Patient Position: Sitting, Cuff Size: Normal)   Pulse 88   Ht 5' 1.5" (1.562 m)   Wt 145 lb 2 oz (65.8 kg)   BMI 26.98 kg/m   Constitutional:   Pleasant Caucasian female appears to be in NAD, Well developed, Well nourished, alert and cooperative Respiratory: Respirations even and unlabored. Lungs clear to auscultation bilaterally.   No wheezes, crackles, or rhonchi.  Cardiovascular: Normal S1, S2. No MRG. Regular rate and rhythm. No peripheral edema, cyanosis or pallor.  Gastrointestinal:  Soft, nondistended, nontender. No rebound or guarding. Normal bowel sounds. No appreciable masses or hepatomegaly. Rectal:  Not performed.  Psychiatric:  Demonstrates good judgement and reason without abnormal affect or behaviors.  No recent labs or imaging.  Assessment: 1.  GERD: Increase in symptoms over the past 5 months, after sinus infection and drainage, now some better on Pantoprazole 40 mg daily, family history of stomach cancer in her grandfather which is worrying the patient 2.  Personal history of tubulovillous adenoma: Last colonoscopy Feb 2016, recommendations for repeat in 3 years  Plan: 1.  Would recommend patient stay on Pantoprazole 40 mg daily, 30-60 minutes before breakfast for the next 3 weeks, making it a total  month of treatment.  Patient would like to not stay on this medication long-term after reading about side effects.  Discussed she can stop this after 3 weeks and change to Zantac 75 mg twice daily. 2.  Prescription for Zantac 75 mg twice daily, every morning and night #60 with 5 refills 3.  Reviewed antireflux diet and lifestyle modifications.  Discussed with the patient that if she again loses weight that she may not have to be on medicine. 4.  Scheduled patient for a diagnostic EGD and surveillance colonoscopy in [place] with Dr. Hilarie Fredrickson.  Did discuss risk, benefits, limitations alternatives and patient agrees to proceed. 5.  Patient to follow in  clinic per recommendations from Dr. Hilarie Fredrickson after time of procedure.  Ellouise Newer, PA-C [facility] 08/02/2017, 2:13 PM  Cc: Burnard Bunting, MD   Addendum: Reviewed and agree with initial management. Pyrtle, Lajuan Lines, MD

## 2017-08-02 NOTE — Patient Instructions (Addendum)
We have sent the following medications to your pharmacy for you to pick up at your convenience: Protonix 40mg  take 30-60 min before breakfast for 3 weeks.  Take Zantac 75mg  twice daily after finishing 3 weeks of Protonix  Normal BMI (Body Mass Index- based on height and weight) is between 19 and 25. Your BMI today is Body mass index is 26.98 kg/m. [name] Kitchen Please consider follow up  regarding your BMI with your Primary Care Provider.  You have been scheduled for an endoscopy and colonoscopy. Please follow the written instructions given to you at your visit today. Please pick up your prep supplies at the pharmacy within the next 1-3 days. If you use inhalers (even only as needed), please bring them with you on the day of your procedure. Your physician has requested that you go to www.startemmi.com and enter the access code given to you at your visit today. This web site gives a general overview about your procedure. However, you should still follow specific instructions given to you by our office regarding your preparation for the procedure.

## 2017-08-23 ENCOUNTER — Encounter: Payer: Self-pay | Admitting: Internal Medicine

## 2017-09-04 ENCOUNTER — Telehealth: Payer: Self-pay | Admitting: Physician Assistant

## 2017-09-04 NOTE — Telephone Encounter (Signed)
Patient states she was told due to her procedure on this Wednesday 4.17.19 not being until 3:30pm she could have a light breakfast the day before. Patient says her instructions state different and she just wants to clarify. Patient had ov with APP Anderson [place] on 3.13.19.

## 2017-09-04 NOTE — Telephone Encounter (Signed)
Spoke to patient and let her know that she can have a light breakfast it needs to be between 8 am and 9 am and NO LATER than 9 am. She may have either scrambled eggs and toast or chicken noodle soup and crackers. Patient verbalized understanding.

## 2017-09-06 ENCOUNTER — Encounter: Payer: Self-pay | Admitting: Internal Medicine

## 2017-09-06 ENCOUNTER — Ambulatory Visit (AMBULATORY_SURGERY_CENTER): Payer: Managed Care, Other (non HMO) | Admitting: Internal Medicine

## 2017-09-06 ENCOUNTER — Other Ambulatory Visit: Payer: Self-pay

## 2017-09-06 VITALS — BP 146/74 | HR 64 | Temp 98.6°F | Resp 9 | Ht 62.0 in | Wt 145.0 lb

## 2017-09-06 DIAGNOSIS — Z86010 Personal history of colonic polyps: Secondary | ICD-10-CM

## 2017-09-06 DIAGNOSIS — K219 Gastro-esophageal reflux disease without esophagitis: Secondary | ICD-10-CM

## 2017-09-06 MED ORDER — SODIUM CHLORIDE 0.9 % IV SOLN: 500.0000 mL | Freq: Once | INTRAVENOUS | Status: DC

## 2017-09-06 NOTE — Progress Notes (Signed)
Pt's states no medical or surgical changes since previsit or office visit. 

## 2017-09-06 NOTE — Patient Instructions (Addendum)
** Handout given on diverticulosis **  Follow up in office -- next available appointment  YOU HAD AN ENDOSCOPIC PROCEDURE TODAY AT [facility]:   Refer to the procedure report that was given to you for any specific questions about what was found during the examination.  If the procedure report does not answer your questions, please call your gastroenterologist to clarify.  If you requested that your care partner not be given the details of your procedure findings, then the procedure report has been included in a sealed envelope for you to review at your convenience later.  YOU SHOULD EXPECT: Some feelings of bloating in the abdomen. Passage of more gas than usual.  Walking can help get rid of the air that was put into your GI tract during the procedure and reduce the bloating. If you had a lower endoscopy (such as a colonoscopy or flexible sigmoidoscopy) you may notice spotting of blood in your stool or on the toilet paper. If you underwent a bowel prep for your procedure, you may not have a normal bowel movement for a few days.  Please Note:  You might notice some irritation and congestion in your nose or some drainage.  This is from the oxygen used during your procedure.  There is no need for concern and it should clear up in a day or so.  SYMPTOMS TO REPORT IMMEDIATELY:   Following lower endoscopy (colonoscopy or flexible sigmoidoscopy):  Excessive amounts of blood in the stool  Significant tenderness or worsening of abdominal pains  Swelling of the abdomen that is new, acute  Fever of 100F or higher   Following upper endoscopy (EGD)  Vomiting of blood or coffee ground material  New chest pain or pain under the shoulder blades  Painful or persistently difficult swallowing  New shortness of breath  Fever of 100F or higher  Black, tarry-looking stools  For urgent or emergent issues, a gastroenterologist can be reached at any hour by calling [phone].   DIET:   We do recommend a small meal at first, but then you may proceed to your regular diet.  Drink plenty of fluids but you should avoid alcoholic beverages for 24 hours.  ACTIVITY:  You should plan to take it easy for the rest of today and you should NOT DRIVE or use heavy machinery until tomorrow (because of the sedation medicines used during the test).    FOLLOW UP: Our staff will call the number listed on your records the next business day following your procedure to check on you and address any questions or concerns that you may have regarding the information given to you following your procedure. If we do not reach you, we will leave a message.  However, if you are feeling well and you are not experiencing any problems, there is no need to return our call.  We will assume that you have returned to your regular daily activities without incident.  If any biopsies were taken you will be contacted by phone or by letter within the next 1-3 weeks.  Please call us at [phone] if you have not heard about the biopsies in 3 weeks.    SIGNATURES/CONFIDENTIALITY: You and/or your care partner have signed paperwork which will be entered into your electronic medical record.  These signatures attest to the fact that that the information above on your After Visit Summary has been reviewed and is understood.  Full responsibility of the confidentiality of this discharge information lies with  you and/or your care-partner.

## 2017-09-06 NOTE — Op Note (Signed)
[name]: Stacy Rivas Procedure Date: 09/06/2017 2:43 PM MRN: [id number] Endoscopist: Jerene Bears , MD Age: 62 Referring MD:  Date of Birth: [birth year] Gender: Female Account #: [id number] Procedure:                Upper GI endoscopy Indications:              Gastro-esophageal reflux disease Medicines:                Monitored Anesthesia Care Procedure:                Pre-Anesthesia Assessment:                           - Prior to the procedure, a History and Physical                            was performed, and patient medications and                            allergies were reviewed. The patient's tolerance of                            previous anesthesia was also reviewed. The risks                            and benefits of the procedure and the sedation                            options and risks were discussed with the patient.                            All questions were answered, and informed consent                            was obtained. Prior Anticoagulants: The patient has                            taken no previous anticoagulant or antiplatelet                            agents. ASA Grade Assessment: II - A patient with                            mild systemic disease. After reviewing the risks                            and benefits, the patient was deemed in                            satisfactory condition to undergo the procedure.                           After obtaining informed consent, the endoscope was  passed under direct vision. Throughout the                            procedure, the patient's blood pressure, pulse, and                            oxygen saturations were monitored continuously. The                            Endoscope was introduced through the mouth, and                            advanced to the second part of duodenum. The upper                            GI endoscopy was  accomplished without difficulty.                            The patient tolerated the procedure well. Scope In: Scope Out: Findings:                 Normal mucosa was found in the entire esophagus.                           A 3 cm hiatal hernia was present.                           Multiple small sessile polyps were found in the                            gastric fundus and in the gastric body. These are                            benign appearing and are consistent with fundic                            gland polyps.                           The exam of the stomach was otherwise normal.                           The examined duodenum was normal. Complications:            No immediate complications. Estimated Blood Loss:     Estimated blood loss: none. Impression:               - Normal mucosa was found in the entire esophagus.                           - 3 cm hiatal hernia.                           - Multiple small, benign, gastric polyps.                           -  Normal examined duodenum.                           - No specimens collected. Recommendation:           - Patient has a contact number available for                            emergencies. The signs and symptoms of potential                            delayed complications were discussed with the                            patient. Return to normal activities tomorrow.                            Written discharge instructions were provided to the                            patient.                           - Resume previous diet.                           - Continue present medications.                           - Await pathology results.                           - Office follow-up appointment next available to                            discuss reflux symptoms and management. Jerene Bears, MD 09/06/2017 3:18:29 PM This report has been signed electronically.

## 2017-09-06 NOTE — Op Note (Signed)
[place] Patient Name: Stacy Rivas Procedure Date: 09/06/2017 2:42 PM MRN: [id number] Endoscopist: Jerene Bears , MD Age: 62 Referring MD:  Date of Birth: [birth year] Gender: Female Account #: [id number] Procedure:                Colonoscopy Indications:              High risk colon cancer surveillance: Personal                            history of adenoma with villous component, Last                            colonoscopy 3 years ago Medicines:                Monitored Anesthesia Care Procedure:                Pre-Anesthesia Assessment:                           - Prior to the procedure, a History and Physical                            was performed, and patient medications and                            allergies were reviewed. The patient's tolerance of                            previous anesthesia was also reviewed. The risks                            and benefits of the procedure and the sedation                            options and risks were discussed with the patient.                            All questions were answered, and informed consent                            was obtained. Prior Anticoagulants: The patient has                            taken no previous anticoagulant or antiplatelet                            agents. ASA Grade Assessment: II - A patient with                            mild systemic disease. After reviewing the risks                            and benefits, the patient was deemed in  satisfactory condition to undergo the procedure.                           After obtaining informed consent, the colonoscope                            was passed under direct vision. Throughout the                            procedure, the patient's blood pressure, pulse, and                            oxygen saturations were monitored continuously. The                            Colonoscope was introduced through the anus  and                            advanced to the the cecum, identified by                            appendiceal orifice and ileocecal valve. The                            colonoscopy was performed without difficulty. The                            patient tolerated the procedure well. The quality                            of the bowel preparation was good. The ileocecal                            valve, appendiceal orifice, and rectum were                            photographed. Scope In: 2:57:58 PM Scope Out: 3:12:56 PM Scope Withdrawal Time: 0 hours 7 minutes 10 seconds  Total Procedure Duration: 0 hours 14 minutes 58 seconds  Findings:                 The digital rectal exam was normal.                           Multiple small-mouthed diverticula were found in                            the sigmoid colon and descending colon.                           Internal hemorrhoids were found during                            retroflexion. The hemorrhoids were small.  The exam was otherwise without abnormality. Complications:            No immediate complications. Estimated Blood Loss:     Estimated blood loss: none. Impression:               - Diverticulosis in the sigmoid colon and in the                            descending colon.                           - Small internal hemorrhoids.                           - The examination was otherwise normal.                           - No specimens collected. Recommendation:           - Patient has a contact number available for                            emergencies. The signs and symptoms of potential                            delayed complications were discussed with the                            patient. Return to normal activities tomorrow.                            Written discharge instructions were provided to the                            patient.                           - Resume previous diet.                            - Continue present medications.                           - Repeat colonoscopy in 5 years for surveillance                            given history of tubulovillous adenoma. Jerene Bears, MD 09/06/2017 3:21:00 PM This report has been signed electronically.

## 2017-09-06 NOTE — Progress Notes (Signed)
A and O x3. Report to RN. Tolerated MAC anesthesia well.Teeth unchanged after procedure.

## 2017-09-07 ENCOUNTER — Telehealth: Payer: Self-pay | Admitting: *Deleted

## 2017-09-07 NOTE — Telephone Encounter (Signed)
  Follow up Call-  Call back number 09/06/2017  Post procedure Call Back phone  # [phone]  Permission to leave phone message Yes  Some recent data might be hidden     Patient questions:  Do you have a fever, pain , or abdominal swelling? No. Pain Score  0 *  Have you tolerated food without any problems? Yes.    Have you been able to return to your normal activities? Yes.    Do you have any questions about your discharge instructions: Diet   No. Medications  No. Follow up visit  No.  Do you have questions or concerns about your Care? No.  Actions: * If pain score is 4 or above: No action needed, pain <4.

## 2017-11-03 ENCOUNTER — Encounter: Payer: Self-pay | Admitting: *Deleted

## 2017-11-20 ENCOUNTER — Ambulatory Visit: Payer: Managed Care, Other (non HMO) | Admitting: Internal Medicine

## 2018-01-06 ENCOUNTER — Other Ambulatory Visit: Payer: Self-pay | Admitting: Physician Assistant

## 2018-01-08 ENCOUNTER — Other Ambulatory Visit: Payer: Self-pay | Admitting: Internal Medicine

## 2018-01-08 DIAGNOSIS — Z1231 Encounter for screening mammogram for malignant neoplasm of breast: Secondary | ICD-10-CM

## 2018-01-26 ENCOUNTER — Encounter: Payer: Self-pay | Admitting: Internal Medicine

## 2018-01-26 ENCOUNTER — Ambulatory Visit (INDEPENDENT_AMBULATORY_CARE_PROVIDER_SITE_OTHER): Payer: Managed Care, Other (non HMO) | Admitting: Internal Medicine

## 2018-01-26 ENCOUNTER — Encounter (INDEPENDENT_AMBULATORY_CARE_PROVIDER_SITE_OTHER): Payer: Self-pay

## 2018-01-26 VITALS — BP 130/82 | Ht 61.5 in | Wt 153.0 lb

## 2018-01-26 DIAGNOSIS — K219 Gastro-esophageal reflux disease without esophagitis: Secondary | ICD-10-CM | POA: Diagnosis not present

## 2018-01-26 DIAGNOSIS — K449 Diaphragmatic hernia without obstruction or gangrene: Secondary | ICD-10-CM

## 2018-01-26 NOTE — Progress Notes (Signed)
Subjective:    Patient ID: Stacy Rivas, female    DOB: 08/08/1955, 62 y.o.   MRN: [id number]  HPI Stacy Rivas is a 62 year old female with a past medical history of GERD, history of tubulovillous colon polyp, sigmoid diverticulosis who is seen in follow-up.  She is here alone today.   She was last seen at the time of her upper endoscopy and colonoscopy on 09/06/2017.  Upper endoscopy performed for reflux disease revealed normal esophagus, 3 cm hiatal hernia, multiple sessile, small, fundic gland appearing polyps in the proximal stomach.  The exam was otherwise normal.  Colonoscopy for follow-up of polyp was normal except for multiple diverticula in the sigmoid and descending colon and small internal hemorrhoids.  She has had persistent and long-standing issues intermittently with heartburn.  She was having some dysphagia prior to her last endoscopy but this has resolved.  She is tried different medications including pantoprazole and ranitidine.  Most recently she is using over-the-counter Nexium 20 mg daily.  Currently this is controlling her symptoms completely.  She is having no trouble some heartburn, dysphagia or odynophagia.  No abdominal pain.  Bowel habits are regular.  No blood in her stool or melena.  He does voice some concerns about long-term reflux therapy and this prompted her to try H2 blockers as mentioned above.  She was never taking these twice daily.   Review of Systems As HPI, otherwise negative  Current Medications, Allergies, Past Medical History, Past Surgical History, Family History and Social History were reviewed in [employer] record.     Objective:   Physical Exam BP 130/82   Ht 5' 1.5" (1.562 m)   Wt 153 lb (69.4 kg)   BMI 28.44 kg/m  Constitutional: Well-developed and well-nourished. No distress. HEENT: Normocephalic and atraumatic.  Conjunctivae are normal.  No scleral icterus. Neck: Neck supple. Trachea midline. Cardiovascular:  Normal rate, regular rhythm and intact distal pulses. No M/R/G Pulmonary/chest: Effort normal and breath sounds normal. No wheezing, rales or rhonchi. Abdominal: Soft, nontender, nondistended. Bowel sounds active throughout. There are no masses palpable. No hepatosplenomegaly. Extremities: no clubbing, cyanosis, or edema Neurological: Alert and oriented to person place and time. Skin: Skin is warm and dry. Psychiatric: Normal mood and affect. Behavior is normal.      Assessment & Plan:  62 year old female with a past medical history of GERD, history of tubulovillous colon polyp, sigmoid diverticulosis who is seen in follow-up.  1. GERD/hiatal hernia --we had a long discussion today regarding reflux and pharmacologic treatment for reflux disease.  She does have some small benign fundic gland polyps which are likely related to PPI therapy and previously had been a small concern for her.  I explained that these polyps are benign and have no premalignant potential.  Reassurance provided.  We discussed the long-term risks versus benefits of PPI therapy.  I do recommend that she keep regular attention to her bone density.  If she develops bone mineral density disease we may want to try to get off PPI.  However, Nexium is working very well at a very low dose.  We will continue Nexium 20 mg daily.  She can use ranitidine 75 to 150 mg in the evening if needed for breakthrough symptoms.  Also her hiatal hernia is likely the biggest precipitant of her reflux disease.  We discussed how incision less fundoplication will be soon performed in [place] by 1 of my partners, Dr. Bryan Lemma.  I will see her in 6 months  and at that time if she is interested in fundoplication in order to get off of reflux medicines, I can have her see Dr. Bryan Lemma to discuss further.  We will mail her information regarding TIF.  2.  History of tubulovillous adenoma --recall colonoscopy 5 years, April 2024  25 minutes spent with  the patient today. Greater than 50% was spent in counseling and coordination of care with the patient

## 2018-01-26 NOTE — Patient Instructions (Addendum)
Continue over the counter Nexium 20 mg daily.  Please purchase the following medications over the counter and take as directed: Ranitidine 75 mg every evening as needed for breakthrough reflux.  Please follow up with Dr Hilarie Fredrickson in 6 months.  We will mail information to you regarding the "TIF" procedure.  If you are age 64 or older, your body mass index should be between 23-30. Your Body mass index is 28.44 kg/m. If this is out of the aforementioned range listed, please consider follow up with your Primary Care Provider.  If you are age 34 or younger, your body mass index should be between 19-25. Your Body mass index is 28.44 kg/m. If this is out of the aformentioned range listed, please consider follow up with your Primary Care Provider.

## 2018-02-05 ENCOUNTER — Ambulatory Visit
Admission: RE | Admit: 2018-02-05 | Discharge: 2018-02-05 | Disposition: A | Discharge status: Home / Self Care | Payer: Managed Care, Other (non HMO) | Source: Ambulatory Visit | Attending: Internal Medicine | Admitting: Internal Medicine

## 2018-02-05 DIAGNOSIS — Z1231 Encounter for screening mammogram for malignant neoplasm of breast: Secondary | ICD-10-CM

## 2018-07-26 ENCOUNTER — Other Ambulatory Visit: Payer: Managed Care, Other (non HMO)

## 2018-07-26 ENCOUNTER — Ambulatory Visit (INDEPENDENT_AMBULATORY_CARE_PROVIDER_SITE_OTHER): Payer: Managed Care, Other (non HMO) | Admitting: Internal Medicine

## 2018-07-26 ENCOUNTER — Encounter: Payer: Self-pay | Admitting: Internal Medicine

## 2018-07-26 ENCOUNTER — Encounter (INDEPENDENT_AMBULATORY_CARE_PROVIDER_SITE_OTHER): Payer: Self-pay

## 2018-07-26 VITALS — BP 118/74 | HR 82 | Ht 61.5 in | Wt 155.5 lb

## 2018-07-26 DIAGNOSIS — R195 Other fecal abnormalities: Secondary | ICD-10-CM

## 2018-07-26 DIAGNOSIS — R1030 Lower abdominal pain, unspecified: Secondary | ICD-10-CM

## 2018-07-26 DIAGNOSIS — R140 Abdominal distension (gaseous): Secondary | ICD-10-CM | POA: Diagnosis not present

## 2018-07-26 NOTE — Patient Instructions (Signed)
Your provider has requested that you go to the basement level for lab work before leaving today. Press "B" on the elevator. The lab is located at the first door on the left as you exit the elevator.  Please purchase the following medications over the counter and take as directed: Align- 1 capsule by mouth once daily  If you are age 65 or older, your body mass index should be between 23-30. Your Body mass index is 28.91 kg/m. If this is out of the aforementioned range listed, please consider follow up with your Primary Care Provider.  If you are age 59 or younger, your body mass index should be between 19-25. Your Body mass index is 28.91 kg/m. If this is out of the aformentioned range listed, please consider follow up with your Primary Care Provider.

## 2018-07-26 NOTE — Progress Notes (Signed)
Subjective:    Patient ID: Stacy Rivas, female    DOB: March 24, 1956, 63 y.o.   MRN: [id number]  HPI Stacy Rivas is a 63 year old female with a history of GERD, history of tubulovillous colon polyp, sigmoid diverticulosis who is here for follow-up.  She was last seen on 01/26/2018.  She reports that she had been doing well however she had a dental crown replacement in late November 2019.  She had some facial swelling and concern for infection and was placed on penicillin and ibuprofen.  She took penicillin for 4 or 5 days and developed severe diarrhea with mucus in her stool.  She stopped the antibiotic and the diarrhea improved but her bowel habits have not returned to normal for her.  She states that she ate the brat diet for many weeks and while her stools have gotten better they have not returned to normal for her.  She did have a couple of months of close to normal bowel habit but over the last 3 weeks she has had episodes of abdominal bloating left-sided abdominal pain and increased intestinal gas.  Usual for her is 1 bowel movement a day now she is having 2 or 3 bowel movements which start as formed and end as loose/soft.  This is associated with lower abdominal discomfort.  No bleeding.  She cut out dairy.  She tried align for 2 or 3 days only.  She has remained on Nexium 20 mg daily and has had good control of her reflux.  No heartburn, dysphagia or odynophagia.  She has Zantac but has avoided it with the recall.   Review of Systems As per HPI, otherwise negative  Current Medications, Allergies, Past Medical History, Past Surgical History, Family History and Social History were reviewed in [employer] record.     Objective:   Physical Exam  BP 118/74   Pulse 82   Ht 5' 1.5" (1.562 m)   Wt 155 lb 8 oz (70.5 kg)   BMI 28.91 kg/m  Constitutional: Well-developed and well-nourished. No distress. HEENT: Normocephalic and atraumatic.  Conjunctivae are normal.  No  scleral icterus. Neck: Neck supple. Trachea midline. Cardiovascular: Normal rate, regular rhythm and intact distal pulses. No M/R/G Pulmonary/chest: Effort normal and breath sounds normal. No wheezing, rales or rhonchi. Abdominal: Soft, lower abd tenderness without rebound or guarding, nondistended. Bowel sounds active throughout.  Extremities: no clubbing, cyanosis, or edema Neurological: Alert and oriented to person place and time. Skin: Skin is warm and dry. Psychiatric: Normal mood and affect. Behavior is normal.      Assessment & Plan:  63 year old female with a history of GERD, history of tubulovillous colon polyp, sigmoid diverticulosis who is here for follow-up.   1.  Change in bowel habit with intermittent loose stool/lower abdominal pain/abdominal bloating --symptoms started after penicillin antibiotic.  This is likely either postinfectious irritable bowel response.  I would like to exclude infection.  We discussed the gut micro-biome and it is clear that her micro-biome has been disrupted likely by antibiotic therapy.  Exam is relatively benign today and my suspicion for active diverticulitis is low. --Check GI pathogen panel to ensure no C. Difficile --Resume align 1 capsule daily I explained that she will need this for at least a month before determining benefit --If pathogen panel negative and no response in the next week or 2 to probiotic consider rifaximin 550 mg 3 times daily x14 days for IBS with loose stool/diarrhea.  Rifaximin would be expected to  reset her gut microbiota  2.  GERD --well-controlled with low-dose Nexium.  Continue Nexium 20 mg daily  3.  History of colon polyps --recall colonoscopy April 2024  25 minutes spent with the patient today. Greater than 50% was spent in counseling and coordination of care with the patient

## 2018-07-27 ENCOUNTER — Other Ambulatory Visit: Payer: Managed Care, Other (non HMO)

## 2018-07-27 DIAGNOSIS — R195 Other fecal abnormalities: Secondary | ICD-10-CM

## 2018-07-27 DIAGNOSIS — R140 Abdominal distension (gaseous): Secondary | ICD-10-CM

## 2018-07-27 DIAGNOSIS — R1030 Lower abdominal pain, unspecified: Secondary | ICD-10-CM

## 2018-07-30 LAB — GASTROINTESTINAL PATHOGEN PANEL PCR
C. DIFFICILE TOX A/B, PCR: NOT DETECTED
Campylobacter, PCR: NOT DETECTED
Cryptosporidium, PCR: NOT DETECTED
E COLI (ETEC) LT/ST, PCR: NOT DETECTED
E COLI (STEC) STX1/STX2, PCR: NOT DETECTED
E coli 0157, PCR: NOT DETECTED
Giardia lamblia, PCR: NOT DETECTED
NOROVIRUS, PCR: NOT DETECTED
Rotavirus A, PCR: NOT DETECTED
Salmonella, PCR: NOT DETECTED
Shigella, PCR: NOT DETECTED

## 2018-10-22 ENCOUNTER — Encounter: Payer: Self-pay | Admitting: *Deleted

## 2018-10-23 ENCOUNTER — Encounter: Payer: Self-pay | Admitting: Internal Medicine

## 2018-10-23 ENCOUNTER — Other Ambulatory Visit: Payer: Self-pay

## 2018-10-23 ENCOUNTER — Ambulatory Visit (INDEPENDENT_AMBULATORY_CARE_PROVIDER_SITE_OTHER): Payer: Managed Care, Other (non HMO) | Admitting: Internal Medicine

## 2018-10-23 VITALS — Ht 61.5 in | Wt 148.0 lb

## 2018-10-23 DIAGNOSIS — R1033 Periumbilical pain: Secondary | ICD-10-CM | POA: Diagnosis not present

## 2018-10-23 DIAGNOSIS — R140 Abdominal distension (gaseous): Secondary | ICD-10-CM

## 2018-10-23 DIAGNOSIS — K219 Gastro-esophageal reflux disease without esophagitis: Secondary | ICD-10-CM | POA: Diagnosis not present

## 2018-10-23 MED ORDER — PANTOPRAZOLE SODIUM 40 MG PO TBEC: 40.0000 mg | DELAYED_RELEASE_TABLET | Freq: Two times a day (BID) | ORAL | 1 refills | Status: DC

## 2018-10-23 NOTE — Progress Notes (Signed)
Subjective:    Patient ID: Stacy Rivas, female    DOB: 09/30/1955, 63 y.o.   MRN: [id number]  This service was provided via telemedicine.   [employer] app with A/V communication The patient was located at home The provider was located in provider's GI office. The patient did consent to this telephone visit and is aware of possible charges through their insurance for this visit.   The persons participating in this telemedicine service were the patient and I. Time spent on call: 15 minutes   HPI Hasna Stefanik is a 63 year old female with a history of GERD, history of tubulovillous adenoma of the colon, sigmoid diverticulosis here for follow-up.  She was last seen on 07/26/2018.  She is seen by the [employer] app today in the setting of COVID-19 pandemic.  At her last visit she had developed change in bowel habit with looser stool with lower abdominal pain and bloating.  All of this was after taking a round of antibiotics prescribed by her dentist.  It was hypothesized that she had a disrupted gut micro-biome.  We checked GI pathogen panel and enteric infection panel was negative including C. difficile.  We resumed a probiotic and considered rifaximin but never prescribed it.  She reports that she is continuing to have issues with her GI system.  Her loose stools are better as long as she is on Align.  She still having intermittent pain and discomfort near her umbilicus.  This can be sharp.  It is a tenderness.  She still having some abdominal bloating symptom.  She is also had more trouble with her acid reflux and she switched back from Nexium 20 to pantoprazole 40 which she is taking most mornings.  She is still having nocturnal reflux symptoms and having to sleep propped up despite the pantoprazole.  No dysphagia or odynophagia.  No blood in her stool or melena.  Of note she did have shingles around [place] in April and was treated with 7 days of antiviral therapy.  She considers this to be a mild  case as she has had 1 of the shingles vaccines.  She had an upper endoscopy and colonoscopy in 2019.  Upper endoscopy showed a 3 cm hiatal hernia and fundic gland polyps.  Her colonoscopy showed diverticulosis in the left colon but no polyps.  3 years before she had had a villous adenoma removed from the colon.   Review of Systems As per HPI, otherwise negative  Current Medications, Allergies, Past Medical History, Past Surgical History, Family History and Social History were reviewed in [employer] record.     Objective:   Physical Exam No PE, virtual visit  GI pathogen panel, 07/27/2018, negative      Assessment & Plan:   63 year old female with a history of GERD, history of tubulovillous adenoma of the colon, sigmoid diverticulosis here for follow-up.    1.  Mid abdominal pain/abdominal bloating/recent change in bowel habit --ongoing symptoms unclear etiology for the pain.  Less likely but need to keep in mind that she had shingles and could this be post herpetic neuralgia?  I recommended that we evaluate this further with lab work and CT scan. --CBC, CMP --CT scan of the abdomen pelvis with IV contrast for mid abdominal pain --If all of the above unremarkable consider rifaximin 550 mg 3 times daily x14 days  2.  GERD --now uncontrolled in the last several weeks.  She switch back to pantoprazole which seems to be helping but  not completely to this point. --Change Nexium to pantoprazole 40 mg twice daily AC x14 days; after 14 days reduce to once daily --GERD diet and lifestyle modification  3.  History of colon polyps --recall colonoscopy April 2024

## 2018-10-23 NOTE — Patient Instructions (Addendum)
You have been scheduled for a CT scan of the abdomen and pelvis at [facility] ([address]---this is in the same building as [facility]).   You are scheduled on 11/06/18 at 1:45 pm. You should arrive 15 minutes prior to your appointment time for registration. Please follow the written instructions below on the day of your exam:  WARNING: IF YOU ARE ALLERGIC TO IODINE/X-RAY DYE, PLEASE NOTIFY RADIOLOGY IMMEDIATELY AT [phone]! YOU WILL BE GIVEN A 13 HOUR PREMEDICATION PREP.  1) Do not eat or drink anything after 9:45 am (4 hours prior to your test) 2) You have been given 2 bottles of oral contrast to drink. The solution may taste better if refrigerated, but do NOT add ice or any other liquid to this solution. Shake well before drinking.    Drink 1 bottle of contrast @ 11:45 am (2 hours prior to your exam)  Drink 1 bottle of contrast @ 12:45 pm (1 hour prior to your exam)  You may take any medications as prescribed with a small amount of water, if necessary. If you take any of the following medications: METFORMIN, GLUCOPHAGE, GLUCOVANCE, AVANDAMET, RIOMET, FORTAMET, [place] MET, JANUMET, GLUMETZA or METAGLIP, you MAY be asked to HOLD this medication 48 hours AFTER the exam.  The purpose of you drinking the oral contrast is to aid in the visualization of your intestinal tract. The contrast solution may cause some diarrhea. Depending on your individual set of symptoms, you may also receive an intravenous injection of x-ray contrast/dye. Plan on being at [facility] for 30 minutes or longer, depending on the type of exam you are having performed.  This test typically takes 30-45 minutes to complete.  If you have any questions regarding your exam or if you need to reschedule, you may call the CT department at [phone] between the hours of 8:00 am and 5:00 pm, Monday-Friday. ____________________________________________________________________ Your provider has  requested that you go to the basement level for lab work prior to your CT scan.. Press "B" on the elevator. The lab is located at the first door on the left as you exit the elevator.  We have sent the following medications to your pharmacy for you to pick up at your convenience: Pantoprazole 40 mg twice daily before meals x 14 days. After 14 days, please decrease to only once daily dosing.  Discontinue Nexium.  Please continue GERD diet and lifestyle modification.  You will be due for a recall colonoscopy in 08/2022. We will send you a reminder in the mail when it gets closer to that time.

## 2018-10-23 NOTE — Addendum Note (Signed)
Addended by: Larina Bras on: 10/23/2018 05:29 PM   Modules accepted: Orders

## 2018-10-25 ENCOUNTER — Telehealth: Payer: Self-pay | Admitting: *Deleted

## 2018-10-25 NOTE — Telephone Encounter (Addendum)
Prior Authorization request has been made and we are currently awaiting a response from insurance. Reference number is [id number]. Spoke to [place] about this. (insurance phone is [phone].)  Pt diagnosis: K21.9 GERD Pt has attempted and previously failed Nexium and ranitidine 75 mg, ranitidine 150 mg

## 2018-10-29 NOTE — Telephone Encounter (Signed)
Prior Auth for Pantoprazole 40 mg BID approved 10-25-2018 through 12-25-2018.  Approval letter sent to be scanned.  Per [place], Once daily does not require prior auth.

## 2018-10-30 ENCOUNTER — Other Ambulatory Visit (INDEPENDENT_AMBULATORY_CARE_PROVIDER_SITE_OTHER): Payer: Managed Care, Other (non HMO)

## 2018-10-30 DIAGNOSIS — R1033 Periumbilical pain: Secondary | ICD-10-CM | POA: Diagnosis not present

## 2018-10-30 DIAGNOSIS — K219 Gastro-esophageal reflux disease without esophagitis: Secondary | ICD-10-CM

## 2018-10-30 DIAGNOSIS — R140 Abdominal distension (gaseous): Secondary | ICD-10-CM

## 2018-10-30 LAB — CBC WITH DIFFERENTIAL/PLATELET
Basophils Absolute: 0 10*3/uL (ref 0.0–0.1)
Basophils Relative: 0.6 % (ref 0.0–3.0)
Eosinophils Absolute: 0.1 10*3/uL (ref 0.0–0.7)
Eosinophils Relative: 1.8 % (ref 0.0–5.0)
HCT: 44.4 % (ref 36.0–46.0)
Hemoglobin: 14.8 g/dL (ref 12.0–15.0)
Lymphocytes Relative: 20.7 % (ref 12.0–46.0)
Lymphs Abs: 1.3 10*3/uL (ref 0.7–4.0)
MCHC: 33.2 g/dL (ref 30.0–36.0)
MCV: 91.2 fl (ref 78.0–100.0)
Monocytes Absolute: 0.4 10*3/uL (ref 0.1–1.0)
Monocytes Relative: 6.7 % (ref 3.0–12.0)
Neutro Abs: 4.6 10*3/uL (ref 1.4–7.7)
Neutrophils Relative %: 70.2 % (ref 43.0–77.0)
Platelets: 419 10*3/uL — ABNORMAL HIGH (ref 150.0–400.0)
RBC: 4.87 Mil/uL (ref 3.87–5.11)
RDW: 13.2 % (ref 11.5–15.5)
WBC: 6.5 10*3/uL (ref 4.0–10.5)

## 2018-10-30 LAB — COMPREHENSIVE METABOLIC PANEL
ALT: 11 U/L (ref 0–35)
AST: 26 U/L (ref 0–37)
Albumin: 3.9 g/dL (ref 3.5–5.2)
Alkaline Phosphatase: 130 U/L — ABNORMAL HIGH (ref 39–117)
BUN: 7 mg/dL (ref 6–23)
CO2: 23 mEq/L (ref 19–32)
Calcium: 9.1 mg/dL (ref 8.4–10.5)
Chloride: 104 mEq/L (ref 96–112)
Creatinine, Ser: 0.54 mg/dL (ref 0.40–1.20)
GFR: 113.45 mL/min (ref >60.00)
Glucose, Bld: 95 mg/dL (ref 70–99)
Potassium: 3.8 mEq/L (ref 3.5–5.1)
Sodium: 139 mEq/L (ref 135–145)
Total Bilirubin: 0.8 mg/dL (ref 0.2–1.2)
Total Protein: 6.6 g/dL (ref 6.0–8.3)

## 2018-11-05 ENCOUNTER — Telehealth: Payer: Self-pay | Admitting: Internal Medicine

## 2018-11-05 NOTE — Telephone Encounter (Signed)
Pt returned your call from Friday, pls call her again.

## 2018-11-05 NOTE — Telephone Encounter (Signed)
Spoke with the patient

## 2018-11-06 ENCOUNTER — Inpatient Hospital Stay: Admission: RE | Admit: 2018-11-06 | Payer: Managed Care, Other (non HMO) | Source: Ambulatory Visit

## 2018-11-08 ENCOUNTER — Telehealth: Payer: Self-pay | Admitting: Internal Medicine

## 2018-11-08 NOTE — Telephone Encounter (Signed)
Patient called in wanting to follow up with the nurse about her peer to peer  The  Doctor was to have with her insurance company.please call back thanks

## 2018-11-09 ENCOUNTER — Other Ambulatory Visit: Payer: Self-pay

## 2018-11-09 ENCOUNTER — Ambulatory Visit (INDEPENDENT_AMBULATORY_CARE_PROVIDER_SITE_OTHER)
Admission: RE | Admit: 2018-11-09 | Discharge: 2018-11-09 | Disposition: A | Discharge status: Home / Self Care | Payer: Managed Care, Other (non HMO) | Source: Ambulatory Visit | Attending: Internal Medicine | Admitting: Internal Medicine

## 2018-11-09 DIAGNOSIS — R1033 Periumbilical pain: Secondary | ICD-10-CM

## 2018-11-09 DIAGNOSIS — R140 Abdominal distension (gaseous): Secondary | ICD-10-CM

## 2018-11-09 DIAGNOSIS — K219 Gastro-esophageal reflux disease without esophagitis: Secondary | ICD-10-CM

## 2018-11-09 MED ORDER — IOHEXOL 300 MG/ML  SOLN
100.0000 mL | Freq: Once | INTRAMUSCULAR | Status: AC | PRN
  Administered 2018-11-09: 100 mL via INTRAVENOUS

## 2018-11-09 NOTE — Telephone Encounter (Signed)
Late entry: I spoke to patient yesterday evening to advise that a peer to peer was completed and is now authorized by insurance. Auth number # [id number].  Patient has been scheduled for CT abd/pelvis on 11/09/18 at 10:30 am. Per Laverda Sorenson with [facility], patient is aware of this.

## 2018-11-12 ENCOUNTER — Telehealth: Payer: Self-pay | Admitting: *Deleted

## 2018-11-12 ENCOUNTER — Other Ambulatory Visit: Payer: Self-pay

## 2018-11-12 DIAGNOSIS — C569 Malignant neoplasm of unspecified ovary: Secondary | ICD-10-CM

## 2018-11-12 NOTE — Telephone Encounter (Signed)
Called and spoke with the patient regarding her referral. Patient to have an appointment tomorrow.

## 2018-11-13 ENCOUNTER — Inpatient Hospital Stay (HOSPITAL_BASED_OUTPATIENT_CLINIC_OR_DEPARTMENT_OTHER): Payer: Managed Care, Other (non HMO) | Admitting: Gynecologic Oncology

## 2018-11-13 ENCOUNTER — Inpatient Hospital Stay: Payer: Managed Care, Other (non HMO) | Attending: Gynecologic Oncology

## 2018-11-13 ENCOUNTER — Encounter: Payer: Self-pay | Admitting: Gynecologic Oncology

## 2018-11-13 ENCOUNTER — Other Ambulatory Visit: Payer: Self-pay

## 2018-11-13 VITALS — BP 132/84 | HR 98 | Temp 97.8°F | Resp 20 | Ht 61.5 in | Wt 148.0 lb

## 2018-11-13 DIAGNOSIS — R6881 Early satiety: Secondary | ICD-10-CM | POA: Insufficient documentation

## 2018-11-13 DIAGNOSIS — K580 Irritable bowel syndrome with diarrhea: Secondary | ICD-10-CM | POA: Insufficient documentation

## 2018-11-13 DIAGNOSIS — R1900 Intra-abdominal and pelvic swelling, mass and lump, unspecified site: Secondary | ICD-10-CM

## 2018-11-13 DIAGNOSIS — K219 Gastro-esophageal reflux disease without esophagitis: Secondary | ICD-10-CM | POA: Insufficient documentation

## 2018-11-13 DIAGNOSIS — R197 Diarrhea, unspecified: Secondary | ICD-10-CM

## 2018-11-13 DIAGNOSIS — C801 Malignant (primary) neoplasm, unspecified: Secondary | ICD-10-CM | POA: Insufficient documentation

## 2018-11-13 DIAGNOSIS — R188 Other ascites: Secondary | ICD-10-CM

## 2018-11-13 DIAGNOSIS — D259 Leiomyoma of uterus, unspecified: Secondary | ICD-10-CM | POA: Diagnosis not present

## 2018-11-13 DIAGNOSIS — K449 Diaphragmatic hernia without obstruction or gangrene: Secondary | ICD-10-CM | POA: Diagnosis not present

## 2018-11-13 DIAGNOSIS — R740 Nonspecific elevation of levels of transaminase and lactic acid dehydrogenase [LDH]: Secondary | ICD-10-CM | POA: Diagnosis not present

## 2018-11-13 DIAGNOSIS — N200 Calculus of kidney: Secondary | ICD-10-CM | POA: Insufficient documentation

## 2018-11-13 DIAGNOSIS — E785 Hyperlipidemia, unspecified: Secondary | ICD-10-CM | POA: Insufficient documentation

## 2018-11-13 DIAGNOSIS — C786 Secondary malignant neoplasm of retroperitoneum and peritoneum: Secondary | ICD-10-CM | POA: Insufficient documentation

## 2018-11-13 LAB — CEA (IN HOUSE-CHCC): CEA (CHCC-In House): <1 ng/mL (ref 0.00–5.00)

## 2018-11-13 MED ORDER — ALPRAZOLAM 0.25 MG PO TABS: 0.2500 mg | ORAL_TABLET | Freq: Every evening | ORAL | 0 refills | Status: DC | PRN

## 2018-11-13 NOTE — Patient Instructions (Signed)
Preparing for your Surgery  Plan for surgery on November 29, 2018 with Dr. Everitt Amber at [place] will be scheduled for a Diagnostic Laparoscopic Surgery with possible Tumor Debulking..   Pre-operative Testing -You will receive a phone call from presurgical testing at [facility] to arrange for a pre-operative testing appointment before your surgery.  This appointment normally occurs one to two weeks before your scheduled surgery.   -Bring your insurance card, copy of an advanced directive if applicable, medication list  -At that visit, you will be asked to sign a consent for a possible blood transfusion in case a transfusion becomes necessary during surgery.  The need for a blood transfusion is rare but having consent is a necessary part of your care.     -You should not be taking blood thinners or aspirin at least ten days prior to surgery unless instructed by your surgeon.  Day Before Surgery at [place] will be asked to take in a light diet the day before surgery.  Avoid carbonated beverages.  You will be advised to have nothing to eat or drink after midnight the evening before.    Eat a light diet the day before surgery.  Examples including soups, broths, toast, yogurt, mashed potatoes.  Things to avoid include carbonated beverages (fizzy beverages), raw fruits and raw vegetables, or beans.   If your bowels are filled with gas, your surgeon will have difficulty visualizing your pelvic organs which increases your surgical risks.  Your role in recovery Your role is to become active as soon as directed by your doctor, while still giving yourself time to heal.  Rest when you feel tired. You will be asked to do the following in order to speed your recovery:  - Cough and breathe deeply. This helps toclear and expand your lungs and can prevent pneumonia. You may be given a spirometer to practice deep breathing. A staff member will show you how to use the spirometer. -  Do mild physical activity. Walking or moving your legs help your circulation and body functions return to normal. A staff member will help you when you try to walk and will provide you with simple exercises. Do not try to get up or walk alone the first time. - Actively manage your pain. Managing your pain lets you move in comfort. We will ask you to rate your pain on a scale of zero to 10. It is your responsibility to tell your doctor or nurse where and how much you hurt so your pain can be treated.  Special Considerations -If you are diabetic, you may be placed on insulin after surgery to have closer control over your blood sugars to promote healing and recovery.  This does not mean that you will be discharged on insulin.  If applicable, your oral antidiabetics will be resumed when you are tolerating a solid diet.  -Your final pathology results from surgery should be available around one week after surgery and the results will be relayed to you when available.  -Dr. Lahoma Crocker  is the Surgeon that assists your GYN Oncologist with surgery.  The next day after your surgery you will either see your GYN Oncologist, Dr. Denman George, or Dr. Lahoma Crocker.  -FMLA forms can be faxed to [phone] and please allow 5-7 business days for completion.  Eat a light diet the day before surgery.  Examples including soups, broths, toast, yogurt, mashed potatoes.  Things to avoid include carbonated beverages (fizzy beverages),  raw fruits and raw vegetables, or beans.   If your bowels are filled with gas, your surgeon will have difficulty visualizing your pelvic organs which increases your surgical risks.  Blood Transfusion Information WHAT IS A BLOOD TRANSFUSION? A transfusion is the replacement of blood or some of its parts. Blood is made up of multiple cells which provide different functions.  Red blood cells carry oxygen and are used for blood loss replacement.  White blood cells fight against  infection.  Platelets control bleeding.  Plasma helps clot blood.  Other blood products are available for specialized needs, such as hemophilia or other clotting disorders. BEFORE THE TRANSFUSION  Who gives blood for transfusions?   You may be able to donate blood to be used at a later date on yourself (autologous donation).  Relatives can be asked to donate blood. This is generally not any safer than if you have received blood from a stranger. The same precautions are taken to ensure safety when a relative's blood is donated.  Healthy volunteers who are fully evaluated to make sure their blood is safe. This is blood bank blood. Transfusion therapy is the safest it has ever been in the practice of medicine. Before blood is taken from a donor, a complete history is taken to make sure that person has no history of diseases nor engages in risky social behavior (examples are intravenous drug use or sexual activity with multiple partners). The donor's travel history is screened to minimize risk of transmitting infections, such as malaria. The donated blood is tested for signs of infectious diseases, such as HIV and hepatitis. The blood is then tested to be sure it is compatible with you in order to minimize the chance of a transfusion reaction. If you or a relative donates blood, this is often done in anticipation of surgery and is not appropriate for emergency situations. It takes many days to process the donated blood. RISKS AND COMPLICATIONS Although transfusion therapy is very safe and saves many lives, the main dangers of transfusion include:   Getting an infectious disease.  Developing a transfusion reaction. This is an allergic reaction to something in the blood you were given. Every precaution is taken to prevent this. The decision to have a blood transfusion has been considered carefully by your caregiver before blood is given. Blood is not given unless the benefits outweigh the risks.

## 2018-11-13 NOTE — Progress Notes (Signed)
Consult Note: Gyn-Onc  Stacy Rivas 63 y.o. female  CC:  Chief Complaint  Patient presents with  . Ovarian Cancer    HPI: Patient is seen today in consultation at the request of Dr. Hilarie Fredrickson. PCP: Dr. Reynaldo Minium  Patient is a very pleasant G1P0 who took some antibiotics in 11/19 for a tooth issue and did not feel "right" after that. She then began having worse reflux and bloating in 3/20. She states that due to [place], she decided to "stick it out". She then communicated with Dr. Hilarie Fredrickson and a CT scan was ordered 11/09/18:  -Hepatobiliary: No hepatic masses identified. Gallbladder is unremarkable. -Adrenals/Urinary Tract: No masses identified. Several small less than 1 cm left renal calculi are identified. No evidence of ureteralcalculi or hydronephrosis. -Stomach/Bowel: Small hiatal hernia noted. No evidence of obstruction, inflammatory process or abnormal fluid collections.  -Vascular/Lymphatic: No pathologically enlarged lymph nodes. No abdominal aortic aneurysm.  -Reproductive: Retroflexed uterus is seen containing multiple small fibroids, largest in the anterior lower uterine segment measuring 5.5 cm and showing cystic degeneration.  -A large poorly defined soft tissue mass is seen in the central pelvis which is contiguous with the anterior wall of the retroflexed uterus. This measures 14.7 x 9.5 by 11.3 cm, and is suspicious for ovarian carcinoma, with uterine leiomyosarcoma considered a less likely differential diagnosis.  -Other: Moderate ascites is seen. Omental soft tissue caking and multiple peritoneal nodules are present in the abdomen and pelvis, consistent with diffuse peritoneal carcinomatosis. IMPRESSION: 1. 14.7 cm poorly defined soft tissue mass in central pelvis suspicious for primary ovarian carcinoma, with uterine leiomyosarcoma considered a less likely differential diagnosis. 2. Moderate ascites and diffuse peritoneal carcinomatosis. 3. Several small uterine  fibroids.  She states that in retrospect, her symptoms of reflux have been getting worse but she thought that it was due to her antibiotics. She was also having increased diarrhea, lower abdominal pressure and fullness, increased gas and early satiety. She though that her hiatal hernia may also be worse. She states that she was always on the look out for bleeding as she is PMP, but was not aware of these symptoms. She went through MP around [age] but never took any HRT. She took OCPs for about 10 years cumulatively in college.  Health maintenance is up-to-date and she is not due for repeat colonoscopy until 2024.  She has never had an abnormal Pap smear.  Review of Systems: Constitutional:Denies fever. + weight loss due to nerves. Also trying to walk about 2-3 miles per day Skin: No rash Cardiovascular: No chest pain, shortness of breath, or edema  Pulmonary: No cough  Gastro Intestinal: Reporting intermittent lower abdominal soreness.  No nausea, vomiting, constipation, + diarrhea reported. + early satiety Genitourinary: + pelvic pressure. Denies vaginal bleeding and discharge.  Musculoskeletal: Mild LBP   Psychology: Very nervous due to this diagnosis. She has xanax at home and has been taking .125 prn  Current Meds:  Outpatient Encounter Medications as of 11/13/2018  Medication Sig  . Cholecalciferol (VITAMIN D3) 250 MCG (10000 UT) capsule Take 10,000 Units by mouth daily.  . Multiple Vitamin (MULTIVITAMIN) capsule Take 1 capsule by mouth daily.  . pantoprazole (PROTONIX) 40 MG tablet Take 1 tablet (40 mg total) by mouth 2 (two) times daily before a meal.  . Probiotic Product (ALIGN PO) Take 1 capsule by mouth daily.   No facility-administered encounter medications on file as of 11/13/2018.     Allergy:  Allergies  Allergen Reactions  . Shrimp [  Shellfish Allergy]   . Sulfonamide Derivatives Nausea Only    Social Hx:   Social History   Socioeconomic History  . Marital status:  Married    Spouse name: Not on file  . Number of children: 0  . Years of education: Not on file  . Highest education level: Not on file  Occupational History  . Occupation: Geographical information systems officer  . Financial resource strain: Not on file  . Food insecurity    Worry: Not on file    Inability: Not on file  . Transportation needs    Medical: Not on file    Non-medical: Not on file  Tobacco Use  . Smoking status: Former Smoker    Years: 10.00    Types: Cigarettes    Quit date: 05/23/1980    Years since quitting: 38.5  . Smokeless tobacco: Never Used  . Tobacco comment: quit 1984  Substance and Sexual Activity  . Alcohol use: Yes    Alcohol/week: 0.0 standard drinks    Comment: 2/day  . Drug use: No  . Sexual activity: Yes    Partners: Male  Lifestyle  . Physical activity    Days per week: Not on file    Minutes per session: Not on file  . Stress: Not on file  Relationships  . Social Herbalist on phone: Not on file    Gets together: Not on file    Attends religious service: Not on file    Active member of club or organization: Not on file    Attends meetings of clubs or organizations: Not on file    Relationship status: Not on file  . Intimate partner violence    Fear of current or ex partner: Not on file    Emotionally abused: Not on file    Physically abused: Not on file    Forced sexual activity: Not on file  Other Topics Concern  . Not on file  Social History Narrative  . Not on file    Past Surgical Hx:  Past Surgical History:  Procedure Laterality Date  . CATARACT EXTRACTION, BILATERAL    . cystoscopy with instillation for cystitis  2001  . DILATION AND CURETTAGE OF UTERUS     miscarriage  . LAPAROSCOPY     adhesions  . LITHOTRIPSY  07/2013   x 2  . MYOMECTOMY  1992    Past Medical Hx:  Past Medical History:  Diagnosis Date  . Anxiety state, unspecified   . Diverticulosis   . Esophageal reflux   . Fibroid tumor   . Hiatal hernia   .  Hyperlipidemia   . IBS (irritable bowel syndrome)   . Internal hemorrhoids   . Nonspecific elevation of levels of transaminase or lactic acid dehydrogenase (LDH)   . Renal stone 07/2013  . Tubulovillous adenoma of colon   . Varicose veins    superficail thrombophlebitis (left LE)    Oncology Hx:  Oncology History   No history exists.    Family Hx:  Family History  Problem Relation Age of Onset  . Lymphoma Father   . Heart disease Father   . Hypertension Father   . Hypertension Mother   . Hyperlipidemia Mother   . Breast cancer Mother 26  . Diabetes Maternal Grandmother   . Uterine cancer Maternal Grandmother   . Stomach cancer Paternal Grandfather   . Colon cancer Neg Hx     Vitals:  Blood pressure 132/84, pulse 98, temperature  97.8 F (36.6 C), resp. rate 20, height 5' 1.5" (1.562 m), weight 148 lb (67.1 kg), SpO2 100 %.  Physical Exam: Well-nourished well-developed female in no acute distress.  Neck: Supple, no lymphadenopathy, no thyromegaly.  Abdomen: Well-healed laparoscopic incision.  Palpable fluid wave.  Abdomen is distended.  There is no rebound or guarding.  There is a fullness felt best towards the left side just below the umbilicus.  Groins: No lymphadenopathy.  Extremities: No edema.  Pelvic: External genitalia within normal limits.  The vagina is atrophic.  The cervix is deviated anteriorly.  It is nulliparous.  There is no visible lesions.  There is a physiologic discharge.  Bimanual examination reveals a cervix to be deviated anteriorly.  The cervix is palpably normal.  I cannot separate the uterus from the large abdominal pelvic mass measuring approximately 15 cm.  It appears to be leaning more towards the patient's left side.  It is not freely mobile.  On rectovaginal examination there is nodularity felt but the rectal mucosa is smooth.  Assessment/Plan:  63 year old with probable advanced stage ovarian cancer with omental disease, ascites, and a large  abdominal pelvic mass.  On imaging it does appear to be near the rectosigmoid colon and on exam nodularity is present but the rectal mucosa is smooth.  The mass is not ballotable but it is not firmly fixed in the pelvis.  I discussed that I believe she has stage IIIc ovarian cancer with the patient as well as her Stacy Rivas who was on the phone.  Based on the CT scan imaging I think that there is a reasonable likelihood that this would be able to be resected in an optimal fashion.  I spoke with the patient and her Stacy that we would proceed with a diagnostic laparoscopy.  We discussed that I explained the Fagotti scoring system.  We discussed that if at the time of laparoscopy her score is greater or equal to 8 then we would proceed only with removal of her ascites and possible biopsies for tissue diagnosis.  I then discussed with them that we would proceed with chemotherapy and reevaluate for interval cytoreductive surgery after 3 cycles.  We discussed that if the score was less than 8, that we would proceed with exploratory laparotomy, total abdominal hysterectomy, bilateral salpingo-oophorectomy and omentectomy with goal of removing all visible disease.  I discussed with her that this may take a rectosigmoid resection and that approximately 5% of the time we would need to proceed with a colostomy.  We reviewed that both primary debulking surgery and interval walking surgery after neoadjuvant chemotherapy both can afford very good outcomes with 80% of our patients achieving remission.  However, it is very important to have an R0 resection either at the primary time point or at this time of interval debulking.  This is the rationale for proceeding with a diagnostic laparoscopy.  We discussed that chemotherapy would involve paclitaxel and carboplatin.  We discussed that each treatment is approximately 6 hours and are given every 3 weeks apart.  We briefly discussed some of the side effects including fatigue  and hair loss.  We discussed that nausea and vomiting is not a major issue with this regimen.  I discussed genetic testing.  The patient does not have any children however her Stacy has children.  We discussed that the genetic testing would be beneficial for her family as well as for her in terms of prognosticating her probable ovarian cancer as well as treatment  options with Parp inhibitors.  She is tentatively scheduled for surgery on July 7.  She was offered a therapeutic and diagnostic paracentesis but she states at this point she is doing okay but she will let us know if she changes her mind.  She understands that Dr. Everitt Amber will be her surgeon and that I will communicate today's discussion and findings with Dr. Denman George before her surgery.  I will order a Ca125 and CEA today.   Wilfrido Luedke A., MD 11/13/2018, 1:44 PM

## 2018-11-14 LAB — CA 125: Cancer Antigen (CA) 125: 1434 U/mL — ABNORMAL HIGH (ref 0.0–38.1)

## 2018-11-16 ENCOUNTER — Telehealth: Payer: Self-pay

## 2018-11-16 NOTE — Telephone Encounter (Signed)
Told Ms Nogales the information as noted below by Dr. Denman George regarding the CA-125. Told patient that the CEA marker was normal at less then 1.

## 2018-11-16 NOTE — Telephone Encounter (Signed)
-----   Message from Everitt Amber, MD sent at 11/15/2018  4:35 PM EDT ----- Stacy Rivas, Dr Stacy Rivas informed her that she has cancer and so this elevated tumor marker should not come as a surprise to her. You only need to tell her: "the tumor marker that Dr Stacy Rivas drew which is called CA 125 is elevated. This is consistent with the CT scan findings that are concerning for ovarian cancer. More will be known after your surgery when the pathology is resulted."  Stacy Rivas ----- Message ----- From: Stacy Marus, MD Sent: 11/15/2018   9:24 AM EDT To: Everitt Amber, MD, Stacy Merl, RN  Stacy Rivas Can you please call her with this? Thank you Stacy Rivas ----- Message ----- From: Stacy Rivas, Lab In [place] Sent: 11/13/2018   3:59 PM EDT To: Stacy Marus, MD

## 2018-11-19 ENCOUNTER — Telehealth: Payer: Self-pay | Admitting: *Deleted

## 2018-11-19 ENCOUNTER — Other Ambulatory Visit: Payer: Self-pay | Admitting: Gynecologic Oncology

## 2018-11-19 DIAGNOSIS — R1900 Intra-abdominal and pelvic swelling, mass and lump, unspecified site: Secondary | ICD-10-CM

## 2018-11-19 MED ORDER — TRAMADOL HCL 50 MG PO TABS: 50.0000 mg | ORAL_TABLET | Freq: Four times a day (QID) | ORAL | 0 refills | Status: DC | PRN

## 2018-11-19 NOTE — Telephone Encounter (Signed)
Stacy Rivas states that the pain is worse with sitting or standing.  She is able to lye down on a heating pad.  She is not SOB lying down or standing. Reports abdomen more distended and not able to eat much due to feeling full. Pt agreed to a paracentesis on Wednesday 11-21-18 at [facility] at 11am. as she has moderate acuities as reported in Dr. Elenora Gamma initial consultation on 11-13-18. Joylene John, NP also sent in some tramadol 50 mg prn for pain.

## 2018-11-19 NOTE — Progress Notes (Signed)
See RN note.  Patient calling with increased abdominal discomfort. Fluid needs to be sent for cytology.

## 2018-11-19 NOTE — Telephone Encounter (Signed)
Patient called and left a message to call her back. Patient stated "I am having pain under my right breast bone and my back. Had the discomfort yesterday and again today. Wanted to know if it is from the fluid? Is there anything I can do to help it?" Message forwarded to the desk RN.

## 2018-11-20 NOTE — Patient Instructions (Addendum)
YOU NEED TO HAVE A COVID 19 TEST ON____[name] , July 6th _________, THIS TEST MUST BE DONE BEFORE SURGERY, COME TO [place] ENTRANCE. ONCE YOUR COVID TEST IS COMPLETED, PLEASE BEGIN THE QUARANTINE INSTRUCTIONS AS OUTLINED IN YOUR HANDOUT.                Stacy Rivas    Your procedure is scheduled on: 11-29-2018    Report to [facility] Main  Entrance    Report to admitting at 10:45 AM      Call this number if you have problems the morning of surgery [phone]       Take these medicines the morning of surgery with A SIP OF WATER: Tramadol if needed  Remember: Eat a light diet the day before surgery.  Examples including soups, broths, toast, yogurt, mashed potatoes.  Things to avoid include carbonated beverages (fizzy beverages), raw fruits and raw vegetables, or beans. If your bowels are filled with gas, your surgeon will have difficulty visualizing your pelvic organs which increases your surgical risks.   Do not eat food After Midnight. YOU MAY HAVE CLEAR LIQUIDS FROM MIDNIGHT UNTIL 9:45AM. At 9:45AM Please finish the prescribed Pre-Surgery ENSURE drink. Nothing by mouth after you finish the ENSURE drink !     CLEAR LIQUID DIET   Foods Allowed                                                                     Foods Excluded  Coffee and tea, regular and decaf                             liquids that you cannot  Plain Jell-O in any flavor                                             see through such as: Fruit ices (not with fruit pulp)                                     milk, soups, orange juice  Iced Popsicles                                    All solid food Carbonated beverages, regular and diet                                    Cranberry, grape and apple juices Sports drinks like Gatorade Lightly seasoned clear broth or consume(fat free) Sugar, honey syrup  Sample Menu Breakfast                                Lunch  Supper Cranberry juice                    Beef broth                            Chicken broth Jell-O                                     Grape juice                           Apple juice Coffee or tea                        Jell-O                                      Popsicle                                                Coffee or tea                        Coffee or tea  _____________________________________________________________________    BRUSH YOUR TEETH MORNING OF SURGERY AND RINSE YOUR MOUTH OUT, NO CHEWING GUM CANDY OR MINTS.                                 You may not have any metal on your body including hair pins and              piercings  Do not wear jewelry, make-up, lotions, powders or perfumes, deodorant             Do not wear nail polish.  Do not shave  48 hours prior to surgery.             Do not bring valuables to the hospital. [place].  Contacts, dentures or bridgework may not be worn into surgery.  Leave suitcase in the car. After surgery it may be brought to your room.                  Please read over the following fact sheets you were given: _____________________________________________________________________   [facility] - Preparing for Surgery Before surgery, you can play an important role.  Because skin is not sterile, your skin needs to be as free of germs as possible.  You can reduce the number of germs on your skin by washing with CHG (chlorahexidine gluconate) soap before surgery.  CHG is an antiseptic cleaner which kills germs and bonds with the skin to continue killing germs even after washing. Please DO NOT use if you have an allergy to CHG or antibacterial soaps.  If your skin becomes reddened/irritated stop using the CHG and inform your nurse when you arrive at Short Stay. Do not shave (including legs and underarms) for at least 48 hours prior to the first CHG  shower.  You may shave your face/neck. Please follow these instructions carefully:  1.  Shower with CHG Soap the night before surgery and the  morning of Surgery.  2.  If you choose to wash your hair, wash your hair first as usual with your  normal  shampoo.  3.  After you shampoo, rinse your hair and body thoroughly to remove the  shampoo.                           4.  Use CHG as you would any other liquid soap.  You can apply chg directly  to the skin and wash                       Gently with a scrungie or clean washcloth.  5.  Apply the CHG Soap to your body ONLY FROM THE NECK DOWN.   Do not use on face/ open                           Wound or open sores. Avoid contact with eyes, ears mouth and genitals (private parts).                       Wash face,  Genitals (private parts) with your normal soap.             6.  Wash thoroughly, paying special attention to the area where your surgery  will be performed.  7.  Thoroughly rinse your body with warm water from the neck down.  8.  DO NOT shower/wash with your normal soap after using and rinsing off  the CHG Soap.                9.  Pat yourself dry with a clean towel.            10.  Wear clean pajamas.            11.  Place clean sheets on your bed the night of your first shower and do not  sleep with pets. Day of Surgery : Do not apply any lotions/deodorants the morning of surgery.  Please wear clean clothes to the hospital/surgery center.  FAILURE TO FOLLOW THESE INSTRUCTIONS MAY RESULT IN THE CANCELLATION OF YOUR SURGERY PATIENT SIGNATURE_________________________________  NURSE SIGNATURE__________________________________  ________________________________________________________________________   Stacy Rivas  An incentive spirometer is a tool that can help keep your lungs clear and active. This tool measures how well you are filling your lungs with each breath. Taking long deep breaths may help reverse or decrease the chance  of developing breathing (pulmonary) problems (especially infection) following:  A long period of time when you are unable to move or be active. BEFORE THE PROCEDURE   If the spirometer includes an indicator to show your best effort, your nurse or respiratory therapist will set it to a desired goal.  If possible, sit up straight or lean slightly forward. Try not to slouch.  Hold the incentive spirometer in an upright position. INSTRUCTIONS FOR USE  1. Sit on the edge of your bed if possible, or sit up as far as you can in bed or on a chair. 2. Hold the incentive spirometer in an upright position. 3. Breathe out normally. 4. Place the mouthpiece in your mouth and seal your lips tightly around it. 5. Breathe in slowly and as deeply as  possible, raising the piston or the ball toward the top of the column. 6. Hold your breath for 3-5 seconds or for as long as possible. Allow the piston or ball to fall to the bottom of the column. 7. Remove the mouthpiece from your mouth and breathe out normally. 8. Rest for a few seconds and repeat Steps 1 through 7 at least 10 times every 1-2 hours when you are awake. Take your time and take a few normal breaths between deep breaths. 9. The spirometer may include an indicator to show your best effort. Use the indicator as a goal to work toward during each repetition. 10. After each set of 10 deep breaths, practice coughing to be sure your lungs are clear. If you have an incision (the cut made at the time of surgery), support your incision when coughing by placing a pillow or rolled up towels firmly against it. Once you are able to get out of bed, walk around indoors and cough well. You may stop using the incentive spirometer when instructed by your caregiver.  RISKS AND COMPLICATIONS  Take your time so you do not get dizzy or light-headed.  If you are in pain, you may need to take or ask for pain medication before doing incentive spirometry. It is harder to  take a deep breath if you are having pain. AFTER USE  Rest and breathe slowly and easily.  It can be helpful to keep track of a log of your progress. Your caregiver can provide you with a simple table to help with this. If you are using the spirometer at home, follow these instructions: [place] IF:   You are having difficultly using the spirometer.  You have trouble using the spirometer as often as instructed.  Your pain medication is not giving enough relief while using the spirometer.  You develop fever of 100.5 F (38.1 C) or higher. SEEK IMMEDIATE MEDICAL CARE IF:   You cough up bloody sputum that had not been present before.  You develop fever of 102 F (38.9 C) or greater.  You develop worsening pain at or near the incision site. MAKE SURE YOU:   Understand these instructions.  Will watch your condition.  Will get help right away if you are not doing well or get worse. Document Released: 09/19/2006 Document Revised: 08/01/2011 Document Reviewed: 11/20/2006 [facility] Patient Information 2014 [place], [place].   ________________________________________________________________________

## 2018-11-21 ENCOUNTER — Ambulatory Visit (HOSPITAL_COMMUNITY)
Admission: RE | Admit: 2018-11-21 | Discharge: 2018-11-21 | Disposition: A | Discharge status: Home / Self Care | Payer: Managed Care, Other (non HMO) | Source: Ambulatory Visit | Attending: Gynecologic Oncology | Admitting: Gynecologic Oncology

## 2018-11-21 ENCOUNTER — Encounter (HOSPITAL_COMMUNITY): Payer: Self-pay

## 2018-11-21 ENCOUNTER — Encounter (HOSPITAL_COMMUNITY)
Admission: RE | Admit: 2018-11-21 | Discharge: 2018-11-21 | Disposition: A | Discharge status: Home / Self Care | Payer: Managed Care, Other (non HMO) | Source: Ambulatory Visit | Attending: Gynecologic Oncology | Admitting: Gynecologic Oncology

## 2018-11-21 ENCOUNTER — Other Ambulatory Visit: Payer: Self-pay

## 2018-11-21 DIAGNOSIS — R1900 Intra-abdominal and pelvic swelling, mass and lump, unspecified site: Secondary | ICD-10-CM | POA: Insufficient documentation

## 2018-11-21 HISTORY — DX: Headache, unspecified: R51.9

## 2018-11-21 HISTORY — PX: PARACENTESIS: SHX844

## 2018-11-21 LAB — COMPREHENSIVE METABOLIC PANEL
ALT: 14 U/L (ref 0–44)
AST: 29 U/L (ref 15–41)
Albumin: 3 g/dL — ABNORMAL LOW (ref 3.5–5.0)
Alkaline Phosphatase: 100 U/L (ref 38–126)
Anion gap: 6 (ref 5–15)
BUN: 14 mg/dL (ref 8–23)
CO2: 26 mmol/L (ref 22–32)
Calcium: 8.5 mg/dL — ABNORMAL LOW (ref 8.9–10.3)
Chloride: 106 mmol/L (ref 98–111)
Creatinine, Ser: 0.51 mg/dL (ref 0.44–1.00)
GFR calc Af Amer: >60 mL/min (ref >60)
GFR calc non Af Amer: >60 mL/min (ref >60)
Glucose, Bld: 123 mg/dL — ABNORMAL HIGH (ref 70–99)
Potassium: 4.4 mmol/L (ref 3.5–5.1)
Sodium: 138 mmol/L (ref 135–145)
Total Bilirubin: 0.7 mg/dL (ref 0.3–1.2)
Total Protein: 6.2 g/dL — ABNORMAL LOW (ref 6.5–8.1)

## 2018-11-21 LAB — URINALYSIS, ROUTINE W REFLEX MICROSCOPIC
Bacteria, UA: NONE SEEN
Bilirubin Urine: NEGATIVE
Glucose, UA: NEGATIVE mg/dL
Ketones, ur: 20 mg/dL — AB
Leukocytes,Ua: NEGATIVE
Nitrite: NEGATIVE
Protein, ur: NEGATIVE mg/dL
Specific Gravity, Urine: 1.024 (ref 1.005–1.030)
pH: 6 (ref 5.0–8.0)

## 2018-11-21 LAB — CBC
HCT: 42 % (ref 36.0–46.0)
Hemoglobin: 13.5 g/dL (ref 12.0–15.0)
MCH: 29.2 pg (ref 26.0–34.0)
MCHC: 32.1 g/dL (ref 30.0–36.0)
MCV: 90.7 fL (ref 80.0–100.0)
Platelets: 468 10*3/uL — ABNORMAL HIGH (ref 150–400)
RBC: 4.63 MIL/uL (ref 3.87–5.11)
RDW: 13 % (ref 11.5–15.5)
WBC: 7.6 10*3/uL (ref 4.0–10.5)
nRBC: 0 % (ref 0.0–0.2)

## 2018-11-21 LAB — GRAM STAIN

## 2018-11-21 LAB — ABO/RH: ABO/RH(D): A POS

## 2018-11-21 MED ORDER — LIDOCAINE HCL 1 % IJ SOLN
INTRAMUSCULAR | Status: AC
  Filled 2018-11-21: qty 10

## 2018-11-21 NOTE — Procedures (Signed)
Ultrasound-guided diagnostic and therapeutic paracentesis performed yielding 3.1 liters of slightly hazy, amber fluid. No immediate complications.  A portion of the fluid was submitted to the lab for preordered studies.EBL none.

## 2018-11-22 ENCOUNTER — Other Ambulatory Visit: Payer: Self-pay | Admitting: Gynecologic Oncology

## 2018-11-22 DIAGNOSIS — R1900 Intra-abdominal and pelvic swelling, mass and lump, unspecified site: Secondary | ICD-10-CM

## 2018-11-22 MED ORDER — SENNOSIDES-DOCUSATE SODIUM 8.6-50 MG PO TABS: 2.0000 | ORAL_TABLET | Freq: Every day | ORAL | 1 refills | Status: DC

## 2018-11-22 MED ORDER — IBUPROFEN 800 MG PO TABS: 800.0000 mg | ORAL_TABLET | Freq: Three times a day (TID) | ORAL | 0 refills | Status: DC | PRN

## 2018-11-22 MED ORDER — OXYCODONE HCL 5 MG PO TABS: 5.0000 mg | ORAL_TABLET | ORAL | 0 refills | Status: DC | PRN

## 2018-11-26 ENCOUNTER — Encounter (HOSPITAL_COMMUNITY): Payer: Self-pay | Admitting: Physician Assistant

## 2018-11-26 ENCOUNTER — Other Ambulatory Visit (HOSPITAL_COMMUNITY)
Admission: RE | Admit: 2018-11-26 | Discharge: 2018-11-26 | Disposition: A | Discharge status: Home / Self Care | Payer: Managed Care, Other (non HMO) | Source: Ambulatory Visit | Attending: Gynecologic Oncology | Admitting: Gynecologic Oncology

## 2018-11-26 DIAGNOSIS — Z1159 Encounter for screening for other viral diseases: Secondary | ICD-10-CM | POA: Diagnosis not present

## 2018-11-26 DIAGNOSIS — Z01812 Encounter for preprocedural laboratory examination: Secondary | ICD-10-CM | POA: Insufficient documentation

## 2018-11-26 LAB — CULTURE, BODY FLUID W GRAM STAIN -BOTTLE: Culture: NO GROWTH

## 2018-11-27 LAB — SARS CORONAVIRUS 2 (TAT 6-24 HRS): SARS Coronavirus 2: NEGATIVE

## 2018-11-28 ENCOUNTER — Telehealth: Payer: Self-pay | Admitting: *Deleted

## 2018-11-28 NOTE — Progress Notes (Signed)
Notified Pt of time change for procedure.  Arrival changed to 1230.

## 2018-11-28 NOTE — Telephone Encounter (Signed)
Stacy Rivas was able to take the first bottle of Mag-Citrate.  She is feeling as though she will vomit if she tries to get the second bottle down. The acities has comeback and just cant eat or drik a lot at a time. Told her that Stacy John, NP said that if she cannot take second bottle not to worry. Pt has taken nexium. Suggested that she take 1/2 of a 0.25 mg of xanax to relax. Pt very concerned about following pre op instructions. Stacy Rivas will pour a 1/2 of second bottle and add some water to dilute it and try to drink it in the next couple of hours.

## 2018-11-28 NOTE — Telephone Encounter (Signed)
Patient called back and have drank one bottle mag citrate. But is now getting sick, has had bowel movement. Message given to the desk RN

## 2018-11-28 NOTE — Telephone Encounter (Signed)
Patient called and left a message asking " I'm to start drinking two bottles og mag citrate at 2pm today. How fast do I have to drink them." Message forwarded to desk RN

## 2018-11-29 ENCOUNTER — Inpatient Hospital Stay: Payer: Managed Care, Other (non HMO) | Attending: Gynecologic Oncology | Admitting: Hematology and Oncology

## 2018-11-29 ENCOUNTER — Ambulatory Visit (HOSPITAL_COMMUNITY)
Admission: RE | Admit: 2018-11-29 | Payer: Managed Care, Other (non HMO) | Source: Ambulatory Visit | Admitting: Gynecologic Oncology

## 2018-11-29 ENCOUNTER — Inpatient Hospital Stay: Payer: Managed Care, Other (non HMO)

## 2018-11-29 ENCOUNTER — Encounter: Payer: Self-pay | Admitting: Hematology and Oncology

## 2018-11-29 ENCOUNTER — Other Ambulatory Visit: Payer: Self-pay

## 2018-11-29 ENCOUNTER — Encounter (HOSPITAL_COMMUNITY): Admission: RE | Payer: Self-pay | Source: Ambulatory Visit

## 2018-11-29 ENCOUNTER — Other Ambulatory Visit: Payer: Self-pay | Admitting: Hematology and Oncology

## 2018-11-29 ENCOUNTER — Telehealth: Payer: Self-pay

## 2018-11-29 ENCOUNTER — Telehealth: Payer: Self-pay | Admitting: Oncology

## 2018-11-29 VITALS — BP 132/84 | HR 104 | Temp 98.3°F | Resp 18 | Ht 61.5 in | Wt 147.6 lb

## 2018-11-29 DIAGNOSIS — C569 Malignant neoplasm of unspecified ovary: Secondary | ICD-10-CM | POA: Diagnosis not present

## 2018-11-29 DIAGNOSIS — Z87891 Personal history of nicotine dependence: Secondary | ICD-10-CM | POA: Insufficient documentation

## 2018-11-29 DIAGNOSIS — Z5111 Encounter for antineoplastic chemotherapy: Secondary | ICD-10-CM | POA: Insufficient documentation

## 2018-11-29 DIAGNOSIS — R180 Malignant ascites: Secondary | ICD-10-CM

## 2018-11-29 DIAGNOSIS — C562 Malignant neoplasm of left ovary: Secondary | ICD-10-CM | POA: Insufficient documentation

## 2018-11-29 DIAGNOSIS — C786 Secondary malignant neoplasm of retroperitoneum and peritoneum: Secondary | ICD-10-CM

## 2018-11-29 DIAGNOSIS — Z7189 Other specified counseling: Secondary | ICD-10-CM

## 2018-11-29 LAB — TYPE AND SCREEN
ABO/RH(D): A POS
Antibody Screen: NEGATIVE

## 2018-11-29 SURGERY — LAPAROSCOPY, DIAGNOSTIC
Anesthesia: General

## 2018-11-29 MED ORDER — ONDANSETRON HCL 8 MG PO TABS: 8.0000 mg | ORAL_TABLET | Freq: Three times a day (TID) | ORAL | 1 refills | Status: DC | PRN

## 2018-11-29 MED ORDER — PROCHLORPERAZINE MALEATE 10 MG PO TABS: 10.0000 mg | ORAL_TABLET | Freq: Four times a day (QID) | ORAL | 1 refills | Status: DC | PRN

## 2018-11-29 MED ORDER — LIDOCAINE-PRILOCAINE 2.5-2.5 % EX CREA: TOPICAL_CREAM | CUTANEOUS | 3 refills | Status: DC

## 2018-11-29 MED ORDER — DEXAMETHASONE 4 MG PO TABS: ORAL_TABLET | ORAL | 0 refills | Status: DC

## 2018-11-29 NOTE — Telephone Encounter (Signed)
Called Stacy Rivas to see how she is feeling.  She said it has been and up and down day and that she will be seeing Dr. Alvy Bimler.  Discussed where she will need to check in and that I will meet with her after Dr. Calton Dach appointment to discuss any questions she has.

## 2018-11-29 NOTE — Telephone Encounter (Signed)
Called and given appt for today at 11:45. She will arrive at 1130. She verbalized understanding.

## 2018-11-29 NOTE — Progress Notes (Signed)
START ON PATHWAY REGIMEN - Ovarian     A cycle is every 21 days:     Paclitaxel      Carboplatin   **Always confirm dose/schedule in your pharmacy ordering system**  Patient Characteristics: Preoperative or Nonsurgical Candidate (Clinical Staging), Newly Diagnosed, Neoadjuvant Therapy followed by Surgery Therapeutic Status: Preoperative or Nonsurgical Candidate (Clinical Staging) BRCA Mutation Status: Awaiting Test Results AJCC T Category: cT3 AJCC 8 Stage Grouping: Unknown AJCC N Category: cN0 AJCC M Category: cM0 Therapy Plan: Neoadjuvant Therapy followed by Surgery Intent of Therapy: Curative Intent, Discussed with Patient 

## 2018-11-30 ENCOUNTER — Encounter: Payer: Self-pay | Admitting: Hematology and Oncology

## 2018-11-30 ENCOUNTER — Telehealth: Payer: Self-pay | Admitting: Oncology

## 2018-11-30 ENCOUNTER — Telehealth: Payer: Self-pay | Admitting: Hematology and Oncology

## 2018-11-30 DIAGNOSIS — Z7189 Other specified counseling: Secondary | ICD-10-CM | POA: Insufficient documentation

## 2018-11-30 DIAGNOSIS — C786 Secondary malignant neoplasm of retroperitoneum and peritoneum: Secondary | ICD-10-CM | POA: Insufficient documentation

## 2018-11-30 DIAGNOSIS — R180 Malignant ascites: Secondary | ICD-10-CM | POA: Insufficient documentation

## 2018-11-30 NOTE — Assessment & Plan Note (Signed)
She has recent therapeutic and diagnostic paracentesis Examination today revealed mild ascites but I do not believe she would benefit from repeat paracentesis now We will watch carefully

## 2018-11-30 NOTE — Telephone Encounter (Signed)
Called Stacy Rivas regarding the Stacy Rivas.  She said she has decided not to use it.  She also mentioned that she is having pain "in her bra strap area" due to the fluid in her abdomen.  She had talked with Dr. Alvy Bimler about it yesterday and will try pain medication if it gets worse.  Advised her to call back if she needs anything.

## 2018-11-30 NOTE — Telephone Encounter (Signed)
I recommend scheduled tylenol for now and if not effective over the weekend, call on Monday I have placed scheduling msg to get her chemo started next Friday Does she want a prescription of a wig?

## 2018-11-30 NOTE — Telephone Encounter (Signed)
[facility] and advised her of message below from Dr. Alvy Bimler.  She verbalized agreement and does want a prescription for a wig.  Let her know that we can give it to her at her next appointment.

## 2018-11-30 NOTE — Assessment & Plan Note (Signed)
We discussed goals of care The intent of treatment is curative

## 2018-11-30 NOTE — Telephone Encounter (Signed)
Called [employer] regarding her FMLA.  She said she would like it to start on 12/06/18.

## 2018-11-30 NOTE — Telephone Encounter (Signed)
I talk with patient regarding schedule  

## 2018-11-30 NOTE — Assessment & Plan Note (Signed)
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

## 2018-11-30 NOTE — Assessment & Plan Note (Addendum)
I have reviewed blood work, pathology report and CT imaging with the patient I highly recommend we proceed with neoadjuvant chemotherapy approach given extensive bulky tumor and significant symptoms and recent weight loss and malnutrition We reviewed the NCCN guidelines We discussed the role of chemotherapy. The intent is of curative intent.  We discussed some of the risks, benefits, side-effects of carboplatin & Taxol. Treatment is intravenous, every 3 weeks x 6 cycles  Some of the short term side-effects included, though not limited to, including weight loss, life threatening infections, risk of allergic reactions, need for transfusions of blood products, nausea, vomiting, change in bowel habits, loss of hair, admission to hospital for various reasons, and risks of death.   Long term side-effects are also discussed including risks of infertility, permanent damage to nerve function, hearing loss, chronic fatigue, kidney damage with possibility needing hemodialysis, and rare secondary malignancy including bone marrow disorders.  The patient is aware that the response rates discussed earlier is not guaranteed.  After a long discussion, patient made an informed decision to proceed with the prescribed plan of care.   Patient education material was dispensed. We discussed premedication with dexamethasone before chemotherapy. I recommend chemo education class, port placement and to proceed with chemotherapy as early as next week I plan for 3 cycles of neoadjuvant chemotherapy followed by interval imaging study with the possibility of debulking surgery if she has excellent response to treatment I do not plan prophylactic growth factor support We discussed cranial prosthesis We will refer her to see geneticist after her surgery

## 2018-11-30 NOTE — Progress Notes (Signed)
[place] CONSULT NOTE  Patient Care Team: Burnard Bunting, MD as PCP - General (Internal Medicine)  ASSESSMENT & PLAN:  Ovarian cancer [facility]) I have reviewed blood work, pathology report and CT imaging with the patient I highly recommend we proceed with neoadjuvant chemotherapy approach given extensive bulky tumor and significant symptoms and recent weight loss and malnutrition We reviewed the NCCN guidelines We discussed the role of chemotherapy. The intent is of curative intent.  We discussed some of the risks, benefits, side-effects of carboplatin & Taxol. Treatment is intravenous, every 3 weeks x 6 cycles  Some of the short term side-effects included, though not limited to, including weight loss, life threatening infections, risk of allergic reactions, need for transfusions of blood products, nausea, vomiting, change in bowel habits, loss of hair, admission to hospital for various reasons, and risks of death.   Long term side-effects are also discussed including risks of infertility, permanent damage to nerve function, hearing loss, chronic fatigue, kidney damage with possibility needing hemodialysis, and rare secondary malignancy including bone marrow disorders.  The patient is aware that the response rates discussed earlier is not guaranteed.  After a long discussion, patient made an informed decision to proceed with the prescribed plan of care.   Patient education material was dispensed. We discussed premedication with dexamethasone before chemotherapy. I recommend chemo education class, port placement and to proceed with chemotherapy as early as next week I plan for 3 cycles of neoadjuvant chemotherapy followed by interval imaging study with the possibility of debulking surgery if she has excellent response to treatment I do not plan prophylactic growth factor support We discussed cranial prosthesis We will refer her to see geneticist after her  surgery  Peritoneal carcinomatosis [facility]) She is symptomatic from peritoneal carcinomatosis We discussed symptom management with regular laxatives and antiemetics as needed  Malignant ascites She has recent therapeutic and diagnostic paracentesis Examination today revealed mild ascites but I do not believe she would benefit from repeat paracentesis now We will watch carefully  Goals of care, counseling/discussion We discussed goals of care The intent of treatment is curative   Orders Placed This Encounter  Procedures  . IR IMAGING GUIDED PORT INSERTION    Standing Status:   Future    Standing Expiration Date:   01/30/2020    Order Specific Question:   Reason for Exam (SYMPTOM  OR DIAGNOSIS REQUIRED)    Answer:   need port to start chemo in 1 week    Order Specific Question:   Preferred Imaging Location?    Answer:   [place] [address]    Standing Status:   Standing    Number of Occurrences:   11    Standing Expiration Date:   11/30/2019     CHIEF COMPLAINTS/PURPOSE OF CONSULTATION:  Ovarian cancer, for new adjuvant chemotherapy  HISTORY OF PRESENTING ILLNESS:  Stacy Rivas 63 y.o. female is here because of recent diagnosis of ovarian cancer The patient has become symptomatic over the last few months with gradual abdominal distention, early satiety, nausea and changes in bowel habits She underwent further imaging studies and was noted to have findings suspicious for ovarian cancer A week ago, she underwent diagnostic and therapeutic paracentesis She was originally scheduled for surgery but that was rescheduled  I have reviewed her chart and materials related to her cancer extensively and collaborated history with the patient. Summary of oncologic history is as follows: Oncology History  Ovarian cancer ([place])  11/09/2018 Imaging   1. 14.7 cm poorly defined soft tissue mass in central pelvis suspicious for primary ovarian carcinoma, with uterine  leiomyosarcoma considered a less likely differential diagnosis. 2. Moderate ascites and diffuse peritoneal carcinomatosis. 3. Several small uterine fibroids.   11/13/2018 Tumor Marker   Patient's tumor was tested for the following markers: CA-125 Results of the tumor marker test revealed 1434   11/21/2018 Procedure   Successful ultrasound-guided diagnostic and therapeutic paracentesis yielding 3.1 liters of peritoneal fluid   11/21/2018 Pathology Results   PERITONEAL/ASCITIC FLUID(SPECIMEN 1 OF 1 COLLECTED 11/21/18): ADENOCARCINOMA. Specimen Clinical Information Pelvic mass suspicious for ovarian cancer Source Peritoneal/Ascitic Fluid, (specimen 1 of 1 collected 11/21/18) Gross Specimen: Received is/are 1000 ccs of dark amber fluid. (CM:cm) Prepared: # Smears: 0 # Concentration Technique Slides (i.e. 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   11/29/2018 Initial Diagnosis   Ovarian cancer ([place])   11/29/2018 Cancer Staging   Staging form: Ovary, Fallopian Tube, and Primary Peritoneal Carcinoma, AJCC 8th Edition - Clinical: cT3, cN0, cM0 - Signed by [name] Lark, MD on 11/29/2018    Currently, she feels bloated She complains of right upper quadrant discomfort but not severe She has frequent urination She have early satiety and has lost some weight  MEDICAL HISTORY:  Past Medical History:  Diagnosis Date  . Anxiety state, unspecified   . Diverticulosis   . Esophageal reflux   . Fibroid tumor   . Headache    due to allergies   . Hiatal hernia   . Hyperlipidemia   . IBS (irritable bowel syndrome)   . Internal hemorrhoids   . Nonspecific elevation of levels of transaminase or lactic acid dehydrogenase (LDH)   . Renal stone 07/2013  . Tubulovillous adenoma of colon   . Varicose veins    superficail thrombophlebitis (left LE)    SURGICAL HISTORY: Past Surgical History:  Procedure  Laterality Date  . CATARACT EXTRACTION, BILATERAL    . cystoscopy with instillation for cystitis  2001  . DILATION AND CURETTAGE OF UTERUS     miscarriage  . LAPAROSCOPY     adhesions  . LITHOTRIPSY  07/2013   x 2  . MYOMECTOMY  1992  . PARACENTESIS  11/21/2018   abdominal , removed 3.1 Liters     SOCIAL HISTORY: Social History   Socioeconomic History  . Marital status: Married    Spouse name: Not on file  . Number of children: 0  . Years of education: Not on file  . Highest education level: Not on file  Occupational History  . Occupation: Geographical information systems officer  . Financial resource strain: Not on file  . Food insecurity    Worry: Not on file    Inability: Not on file  . Transportation needs    Medical: Not on file    Non-medical: Not on file  Tobacco Use  . Smoking status: Former Smoker    Years: 10.00    Types: Cigarettes    Quit date: 05/23/1980    Years since quitting: 38.5  . Smokeless tobacco: Never Used  . Tobacco comment: quit 1984  Substance and Sexual Activity  . Alcohol use: Yes    Alcohol/week: 0.0 standard drinks    Comment: 2/day, 11-21-2018   no alcodholint he last month   . Drug use: No  . Sexual activity: Yes    Partners: Male  Lifestyle  . Physical activity  Days per week: Not on file    Minutes per session: Not on file  . Stress: Not on file  Relationships  . Social Herbalist on phone: Not on file    Gets together: Not on file    Attends religious service: Not on file    Active member of club or organization: Not on file    Attends meetings of clubs or organizations: Not on file    Relationship status: Not on file  . Intimate partner violence    Fear of current or ex partner: Not on file    Emotionally abused: Not on file    Physically abused: Not on file    Forced sexual activity: Not on file  Other Topics Concern  . Not on file  Social History Narrative  . Not on file    FAMILY HISTORY: Family History  Problem  Relation Age of Onset  . Lymphoma Father   . Heart disease Father   . Hypertension Father   . Hypertension Mother   . Hyperlipidemia Mother   . Breast cancer Mother 29  . Diabetes Maternal Grandmother   . Uterine cancer Maternal Grandmother   . Stomach cancer Paternal Grandfather   . Colon cancer Neg Hx     ALLERGIES:  is allergic to shrimp [shellfish allergy] and sulfonamide derivatives.  MEDICATIONS:  Current Outpatient Medications  Medication Sig Dispense Refill  . ALPRAZolam (XANAX) 0.25 MG tablet Take 1 tablet (0.25 mg total) by mouth at bedtime as needed for anxiety. (Patient taking differently: Take 0.125 mg by mouth at bedtime as needed for anxiety. ) 15 tablet 0  . carboxymethylcellulose (REFRESH PLUS) 0.5 % SOLN Place 1 drop into both eyes 2 (two) times daily as needed (dry eyes).    . Cholecalciferol (VITAMIN D3) 25 MCG (1000 UT) CAPS Take 1,000 Units by mouth daily.     [name] Kitchen dexamethasone (DECADRON) 4 MG tablet Take 3 tabs at the night before and 3 tab the morning of chemotherapy, every 3 weeks, by mouth 36 tablet 0  . esomeprazole (NEXIUM) 20 MG capsule Take 20 mg by mouth daily at 12 noon.    [name] Kitchen ibuprofen (ADVIL) 800 MG tablet Take 1 tablet (800 mg total) by mouth every 8 (eight) hours as needed for moderate pain. For AFTER surgery 30 tablet 0  . lidocaine-prilocaine (EMLA) cream Apply to affected area once 30 g 3  . Melatonin 5 MG SUBL Place under the tongue daily as needed.    . Multiple Vitamin (MULTIVITAMIN) capsule Take 1 capsule by mouth daily.    . ondansetron (ZOFRAN) 8 MG tablet Take 1 tablet (8 mg total) by mouth every 8 (eight) hours as needed for refractory nausea / vomiting. 30 tablet 1  . oxyCODONE (OXY IR/ROXICODONE) 5 MG immediate release tablet Take 1 tablet (5 mg total) by mouth every 4 (four) hours as needed for severe pain. For AFTER surgery, do not take and drive 15 tablet 0  . pantoprazole (PROTONIX) 40 MG tablet Take 1 tablet (40 mg total) by mouth 2  (two) times daily before a meal. (Patient not taking: Reported on 11/16/2018) 60 tablet 1  . prochlorperazine (COMPAZINE) 10 MG tablet Take 1 tablet (10 mg total) by mouth every 6 (six) hours as needed (Nausea or vomiting). 30 tablet 1  . senna-docusate (SENOKOT-S) 8.6-50 MG tablet Take 2 tablets by mouth at bedtime. For AFTER surgery, do not take if having diarrhea 30 tablet 1   No current  facility-administered medications for this visit.     REVIEW OF SYSTEMS:   Constitutional: Denies fevers, chills or abnormal night sweats Eyes: Denies blurriness of vision, double vision or watery eyes Ears, nose, mouth, throat, and face: Denies mucositis or sore throat Respiratory: Denies cough, dyspnea or wheezes Cardiovascular: Denies palpitation, chest discomfort or lower extremity swelling Skin: Denies abnormal skin rashes Lymphatics: Denies new lymphadenopathy or easy bruising Neurological:Denies numbness, tingling or new weaknesses Behavioral/Psych: Mood is stable, no new changes  All other systems were reviewed with the patient and are negative.  PHYSICAL EXAMINATION: ECOG PERFORMANCE STATUS: 1 - Symptomatic but completely ambulatory  Vitals:   11/29/18 1203  BP: 132/84  Pulse: (!) 104  Resp: 18  Temp: 98.3 F (36.8 C)  SpO2: 97%   Filed Weights   11/29/18 1203  Weight: 147 lb 9.6 oz (67 kg)    GENERAL:alert, no distress and comfortable SKIN: skin color, texture, turgor are normal, no rashes or significant lesions EYES: normal, conjunctiva are pink and non-injected, sclera clear OROPHARYNX:no exudate, no erythema and lips, buccal mucosa, and tongue normal  NECK: supple, thyroid normal size, non-tender, without nodularity LYMPH:  no palpable lymphadenopathy in the cervical, axillary or inguinal LUNGS: clear to auscultation and percussion with normal breathing effort HEART: regular rate & rhythm and no murmurs and no lower extremity edema ABDOMEN:abdomen soft, palpable mass in the  right upper quadrant of her abdomen.  Palpable ascites Musculoskeletal:no cyanosis of digits and no clubbing  PSYCH: alert & oriented x 3 with fluent speech NEURO: no focal motor/sensory deficits  LABORATORY DATA:  I have reviewed the data as listed Lab Results  Component Value Date   WBC 7.6 11/21/2018   HGB 13.5 11/21/2018   HCT 42.0 11/21/2018   MCV 90.7 11/21/2018   PLT 468 (H) 11/21/2018   Recent Labs    10/30/18 0836 11/21/18 1544  NA 139 138  K 3.8 4.4  CL 104 106  CO2 23 26  GLUCOSE 95 123*  BUN 7 14  CREATININE 0.54 0.51  CALCIUM 9.1 8.5*  GFRNONAA  --  >60  GFRAA  --  >60  PROT 6.6 6.2*  ALBUMIN 3.9 3.0*  AST 26 29  ALT 11 14  ALKPHOS 130* 100  BILITOT 0.8 0.7    RADIOGRAPHIC STUDIES: I have personally reviewed the radiological images as listed and agreed with the findings in the report. Ct Abdomen Pelvis W Contrast  Result Date: 11/09/2018 CLINICAL DATA:  Mid and lower abdominal pain and bloating for 3 months. EXAM: CT ABDOMEN AND PELVIS WITH CONTRAST TECHNIQUE: Multidetector CT imaging of the abdomen and pelvis was performed using the standard protocol following bolus administration of intravenous contrast. CONTRAST:  164mL OMNIPAQUE IOHEXOL 300 MG/ML  SOLN COMPARISON:  None. FINDINGS: Lower Chest: No acute findings. Hepatobiliary: No hepatic masses identified. Gallbladder is unremarkable. Pancreas:  No mass or inflammatory changes. Spleen: Within normal limits in size and appearance. Adrenals/Urinary Tract: No masses identified. Several small less than 1 cm left renal calculi are identified. No evidence of ureteral calculi or hydronephrosis. Stomach/Bowel: Small hiatal hernia noted. No evidence of obstruction, inflammatory process or abnormal fluid collections. Vascular/Lymphatic: No pathologically enlarged lymph nodes. No abdominal aortic aneurysm. Reproductive: Retroflexed uterus is seen containing multiple small fibroids, largest in the anterior lower uterine  segment measuring 5.5 cm and showing cystic degeneration. A large poorly defined soft tissue mass is seen in the central pelvis which is contiguous with the anterior wall of the retroflexed uterus.  This measures 14.7 x 9.5 by 11.3 cm, and is suspicious for ovarian carcinoma, with uterine leiomyosarcoma considered a less likely differential diagnosis. Other: Moderate ascites is seen. Omental soft tissue caking and multiple peritoneal nodules are present in the abdomen and pelvis, consistent with diffuse peritoneal carcinomatosis. Musculoskeletal:  No suspicious bone lesions identified. IMPRESSION: 1. 14.7 cm poorly defined soft tissue mass in central pelvis suspicious for primary ovarian carcinoma, with uterine leiomyosarcoma considered a less likely differential diagnosis. 2. Moderate ascites and diffuse peritoneal carcinomatosis. 3. Several small uterine fibroids. These results will be called to the ordering clinician or representative by the Radiologist Assistant, and communication documented in the PACS or zVision Dashboard. Electronically Signed   By: Marlaine Hind M.D.   On: 11/09/2018 12:51   US Paracentesis  Result Date: 11/21/2018 INDICATION: Patient with history of pelvic mass with peritoneal carcinomatosis, elevated CA 125, ascites. Request made for diagnostic and therapeutic paracentesis. EXAM: ULTRASOUND GUIDED DIAGNOSTIC AND THERAPEUTIC PARACENTESIS MEDICATIONS: None COMPLICATIONS: None immediate. PROCEDURE: Informed written consent was obtained from the patient after a discussion of the risks, benefits and alternatives to treatment. A timeout was performed prior to the initiation of the procedure. Initial ultrasound scanning demonstrates a moderate amount of ascites within the right mid to lower abdominal quadrant. The right mid to lower abdomen was prepped and draped in the usual sterile fashion. 1% lidocaine was used for local anesthesia. Following this, a 19 gauge, 7-cm, Yueh catheter was  introduced. An ultrasound image was saved for documentation purposes. The paracentesis was performed. The catheter was removed and a dressing was applied. The patient tolerated the procedure well without immediate post procedural complication. FINDINGS: A total of approximately 3.1 liters of slightly hazy, amber fluid was removed. Samples were sent to the laboratory as requested by the clinical team. IMPRESSION: Successful ultrasound-guided diagnostic and therapeutic paracentesis yielding 3.1 liters of peritoneal fluid. Read by: Rowe Robert, PA-C Electronically Signed   By: Sandi Mariscal M.D.   On: 11/21/2018 12:11    I spent 60 minutes counseling the patient face to face. The total time spent in the appointment was 80 minutes and more than 50% was on counseling.  All questions were answered. The patient knows to call the clinic with any problems, questions or concerns.  [name] Lark, MD 11/30/2018 1:24 PM

## 2018-12-03 ENCOUNTER — Ambulatory Visit (HOSPITAL_COMMUNITY)
Admission: RE | Admit: 2018-12-03 | Discharge: 2018-12-03 | Disposition: A | Discharge status: Home / Self Care | Payer: Managed Care, Other (non HMO) | Source: Ambulatory Visit | Attending: Hematology and Oncology | Admitting: Hematology and Oncology

## 2018-12-03 ENCOUNTER — Other Ambulatory Visit: Payer: Self-pay | Admitting: Hematology and Oncology

## 2018-12-03 ENCOUNTER — Encounter (HOSPITAL_COMMUNITY): Payer: Self-pay | Admitting: Radiology

## 2018-12-03 ENCOUNTER — Telehealth: Payer: Self-pay | Admitting: Oncology

## 2018-12-03 ENCOUNTER — Other Ambulatory Visit: Payer: Self-pay

## 2018-12-03 DIAGNOSIS — R180 Malignant ascites: Secondary | ICD-10-CM

## 2018-12-03 DIAGNOSIS — C569 Malignant neoplasm of unspecified ovary: Secondary | ICD-10-CM

## 2018-12-03 HISTORY — PX: IR PARACENTESIS: IMG2679

## 2018-12-03 MED ORDER — LIDOCAINE HCL 1 % IJ SOLN
INTRAMUSCULAR | Status: AC
  Filled 2018-12-03: qty 20

## 2018-12-03 MED FILL — LIDOCAINE-PRILOCAINE CREAM: 2.5-2.5 | 10 days supply | Qty: 30 | Fill #0

## 2018-12-03 MED FILL — DEXAMETHASONE 4 MG TABLET: 4 | 21 days supply | Qty: 6 | Fill #0

## 2018-12-03 MED FILL — PROCHLORPERAZINE 10 MG TAB: 10 | 7 days supply | Qty: 30 | Fill #0

## 2018-12-03 MED FILL — ONDANSETRON HCL 8 MG TABLET: 8 | 10 days supply | Qty: 30 | Fill #0

## 2018-12-03 NOTE — Telephone Encounter (Signed)
Tagan left a message yesterday saying that she thinks that she should have the fluid drained before chemo on Friday.  She said she is really uncomfortable.  She is able to drink her protein shakes but feels like they "are all in her throat."  She is also having pain in her right side.

## 2018-12-03 NOTE — Telephone Encounter (Signed)
I placed STAT order for [place] para at [facility] If not available, call [facility]

## 2018-12-03 NOTE — Telephone Encounter (Signed)
Called Ilze and advised her of 10 am appointment at [facility] for paracentesis.  She verbalized agreement and understanding.

## 2018-12-03 NOTE — Procedures (Signed)
Ultrasound-guided therapeutic paracentesis performed yielding 3.7 liters of yellow  fluid. No immediate complications. EBL none.   

## 2018-12-04 ENCOUNTER — Ambulatory Visit: Payer: Managed Care, Other (non HMO) | Admitting: Gynecologic Oncology

## 2018-12-04 ENCOUNTER — Telehealth: Payer: Self-pay | Admitting: Oncology

## 2018-12-04 NOTE — Telephone Encounter (Signed)
Stacy Rivas called and said she noticed that she is having some swelling today on her left side all the way down to her groin.  She said it is not firm but is tender to the touch.  She had a paracentesis yesterday on the left side.  She said she had a paracentesis previously on the right side and did not have any swelling.  Called Rowe Robert, PA and discussed symptoms.  He advised that she can stop by IR tomorrow morning at [facility] and have it checked by the PA's.  He also advised telling her that during the night if she has worsening pain, bruising, bleeding, fever or nausea/vomiting to go to the ER.  Called Stacy Rivas back and advised her of Kevin's recommendations.  She verbalized agreement and understanding.

## 2018-12-05 ENCOUNTER — Other Ambulatory Visit: Payer: Self-pay | Admitting: Radiology

## 2018-12-05 ENCOUNTER — Telehealth: Payer: Self-pay | Admitting: Oncology

## 2018-12-05 NOTE — Telephone Encounter (Signed)
Called Stacy Rivas to see how she is feeling this morning. Requested a return call.

## 2018-12-05 NOTE — Telephone Encounter (Signed)
Eustace Pen with [facility] IR and requested CBC and CMP to be drawn with port insertion tomorrow.

## 2018-12-05 NOTE — Telephone Encounter (Signed)
Called Sandy back and advised her of message from Dr. Alvy Bimler.  She verbalized agreement and understanding.

## 2018-12-05 NOTE — Progress Notes (Signed)
Patient ID: Stacy Rivas, female   DOB: 1955-11-06, 63 y.o.   MRN: [id number] Patient status post paracentesis on 7/13.  Asked to evaluate the area due to patient complaints of pain in left lower quadrant as well as deep pelvis.  On exam puncture site LLQ abd is clean, dry, no hematoma, erythema, drainage or ecchymosis, Dermabond in place.  Site mildly tender to palpation.  Quick bedside ultrasound performed showing no obvious complication.  Pt seen and images were reviewed by Dr. Kathlene Cote.  Small amount of ascites noted. Dr. Calton Dach nurse updated.

## 2018-12-05 NOTE — Telephone Encounter (Signed)
It is called dependent edema due to her low albumin status It should improve once we get chemo started

## 2018-12-05 NOTE — Telephone Encounter (Signed)
Stacy Rivas called back and said she went to IR this morning and was seen by Rowe Robert, PA and a Radiologist.  They did an ultrasound and didn't see any fluid to be removed and told her that it wasn't a hematoma.  She is concerned because she is still having swelling in her groin area that started yesterday. She said she can feel that her labia are swollen when she wipes. She is wondering what is causing the swelling.

## 2018-12-06 ENCOUNTER — Ambulatory Visit (HOSPITAL_COMMUNITY)
Admission: RE | Admit: 2018-12-06 | Discharge: 2018-12-06 | Disposition: A | Discharge status: Home / Self Care | Payer: Managed Care, Other (non HMO) | Source: Ambulatory Visit | Attending: Hematology and Oncology | Admitting: Hematology and Oncology

## 2018-12-06 ENCOUNTER — Encounter (HOSPITAL_COMMUNITY): Payer: Self-pay

## 2018-12-06 ENCOUNTER — Other Ambulatory Visit: Payer: Self-pay

## 2018-12-06 DIAGNOSIS — C569 Malignant neoplasm of unspecified ovary: Secondary | ICD-10-CM | POA: Diagnosis not present

## 2018-12-06 DIAGNOSIS — Z882 Allergy status to sulfonamides status: Secondary | ICD-10-CM | POA: Insufficient documentation

## 2018-12-06 DIAGNOSIS — Z7982 Long term (current) use of aspirin: Secondary | ICD-10-CM | POA: Diagnosis not present

## 2018-12-06 DIAGNOSIS — E785 Hyperlipidemia, unspecified: Secondary | ICD-10-CM | POA: Insufficient documentation

## 2018-12-06 DIAGNOSIS — Z8249 Family history of ischemic heart disease and other diseases of the circulatory system: Secondary | ICD-10-CM | POA: Diagnosis not present

## 2018-12-06 DIAGNOSIS — C786 Secondary malignant neoplasm of retroperitoneum and peritoneum: Secondary | ICD-10-CM | POA: Insufficient documentation

## 2018-12-06 DIAGNOSIS — R180 Malignant ascites: Secondary | ICD-10-CM | POA: Insufficient documentation

## 2018-12-06 DIAGNOSIS — K589 Irritable bowel syndrome without diarrhea: Secondary | ICD-10-CM | POA: Insufficient documentation

## 2018-12-06 DIAGNOSIS — Z87891 Personal history of nicotine dependence: Secondary | ICD-10-CM | POA: Diagnosis not present

## 2018-12-06 HISTORY — PX: IR IMAGING GUIDED PORT INSERTION: IMG5740

## 2018-12-06 LAB — COMPREHENSIVE METABOLIC PANEL
ALT: 31 U/L (ref 0–44)
AST: 57 U/L — ABNORMAL HIGH (ref 15–41)
Albumin: 2.1 g/dL — ABNORMAL LOW (ref 3.5–5.0)
Alkaline Phosphatase: 359 U/L — ABNORMAL HIGH (ref 38–126)
Anion gap: 12 (ref 5–15)
BUN: 11 mg/dL (ref 8–23)
CO2: 21 mmol/L — ABNORMAL LOW (ref 22–32)
Calcium: 8.4 mg/dL — ABNORMAL LOW (ref 8.9–10.3)
Chloride: 104 mmol/L (ref 98–111)
Creatinine, Ser: 0.54 mg/dL (ref 0.44–1.00)
GFR calc Af Amer: >60 mL/min (ref >60)
GFR calc non Af Amer: >60 mL/min (ref >60)
Glucose, Bld: 110 mg/dL — ABNORMAL HIGH (ref 70–99)
Potassium: 4 mmol/L (ref 3.5–5.1)
Sodium: 137 mmol/L (ref 135–145)
Total Bilirubin: 0.7 mg/dL (ref 0.3–1.2)
Total Protein: 5.5 g/dL — ABNORMAL LOW (ref 6.5–8.1)

## 2018-12-06 LAB — CBC
HCT: 41.5 % (ref 36.0–46.0)
Hemoglobin: 13.6 g/dL (ref 12.0–15.0)
MCH: 28.2 pg (ref 26.0–34.0)
MCHC: 32.8 g/dL (ref 30.0–36.0)
MCV: 85.9 fL (ref 80.0–100.0)
Platelets: 583 10*3/uL — ABNORMAL HIGH (ref 150–400)
RBC: 4.83 MIL/uL (ref 3.87–5.11)
RDW: 13.2 % (ref 11.5–15.5)
WBC: 7.9 10*3/uL (ref 4.0–10.5)
nRBC: 0 % (ref 0.0–0.2)

## 2018-12-06 LAB — PROTIME-INR
INR: 1 (ref 0.8–1.2)
Prothrombin Time: 12.7 seconds (ref 11.4–15.2)

## 2018-12-06 MED ORDER — LIDOCAINE HCL (PF) 1 % IJ SOLN
INTRAMUSCULAR | Status: AC | PRN
  Administered 2018-12-06: 8 mL

## 2018-12-06 MED ORDER — MIDAZOLAM HCL 2 MG/2ML IJ SOLN
INTRAMUSCULAR | Status: AC
  Filled 2018-12-06: qty 2

## 2018-12-06 MED ORDER — MIDAZOLAM HCL 2 MG/2ML IJ SOLN
INTRAMUSCULAR | Status: AC | PRN
  Administered 2018-12-06 (×2): 1 mg via INTRAVENOUS

## 2018-12-06 MED ORDER — FENTANYL CITRATE (PF) 100 MCG/2ML IJ SOLN
INTRAMUSCULAR | Status: AC
  Filled 2018-12-06: qty 2

## 2018-12-06 MED ORDER — FENTANYL CITRATE (PF) 100 MCG/2ML IJ SOLN
INTRAMUSCULAR | Status: AC | PRN
  Administered 2018-12-06 (×2): 50 ug via INTRAVENOUS

## 2018-12-06 MED ORDER — LIDOCAINE-EPINEPHRINE (PF) 1 %-1:200000 IJ SOLN
INTRAMUSCULAR | Status: AC
  Filled 2018-12-06: qty 30

## 2018-12-06 MED ORDER — CEFAZOLIN SODIUM-DEXTROSE 2-4 GM/100ML-% IV SOLN
2.0000 g | INTRAVENOUS | Status: AC
  Administered 2018-12-06: 2 g via INTRAVENOUS

## 2018-12-06 MED ORDER — LIDOCAINE HCL 1 % IJ SOLN
INTRAMUSCULAR | Status: AC
  Filled 2018-12-06: qty 20

## 2018-12-06 MED ORDER — SODIUM CHLORIDE 0.9 % IV SOLN: INTRAVENOUS | Status: DC

## 2018-12-06 MED ORDER — CEFAZOLIN SODIUM-DEXTROSE 2-4 GM/100ML-% IV SOLN
INTRAVENOUS | Status: AC
  Administered 2018-12-06: 11:00:00 2 g via INTRAVENOUS
  Filled 2018-12-06: qty 100

## 2018-12-06 MED ORDER — HEPARIN SOD (PORK) LOCK FLUSH 100 UNIT/ML IV SOLN
INTRAVENOUS | Status: AC
  Administered 2018-12-06: 500 [IU]
  Filled 2018-12-06: qty 5

## 2018-12-06 MED ORDER — LIDOCAINE-EPINEPHRINE (PF) 1 %-1:200000 IJ SOLN
INTRAMUSCULAR | Status: AC | PRN
  Administered 2018-12-06: 10 mL

## 2018-12-06 NOTE — Procedures (Signed)
Interventional Radiology Procedure:   Indications: Ovarian cancer  Procedure: Port placement  Findings: Right jugular port, tip at SVC/RA junction  Complications: None     EBL: Minimal, less than 10 ml  Plan: Discharge in one hour.  Keep port site and incisions dry for at least 24 hours.     Sriansh Farra R. Haze Antillon, MD  Pager: [phone]   

## 2018-12-06 NOTE — Sedation Documentation (Signed)
Pt in IR room 1, supine on bed, secured with strap.  Pt placed on 2L O2 via [place].  Pt on cont cardiac monitoring

## 2018-12-06 NOTE — Discharge Instructions (Signed)
Implanted Port Insertion, Care After °This sheet gives you information about how to care for yourself after your procedure. Your health care provider may also give you more specific instructions. If you have problems or questions, contact your health care provider. °What can I expect after the procedure? °After the procedure, it is common to have: °· Discomfort at the port insertion site. °· Bruising on the skin over the port. This should improve over 3-4 days. °Follow these instructions at home: °Port care °· After your port is placed, you will get a manufacturer's information card. The card has information about your port. Keep this card with you at all times. °· Take care of the port as told by your health care provider. Ask your health care provider if you or a family member can get training for taking care of the port at home. A home health care nurse may also take care of the port. °· Make sure to remember what type of port you have. °Incision care ° °  ° °· Follow instructions from your health care provider about how to take care of your port insertion site. Make sure you: °? Wash your hands with soap and water before and after you change your bandage (dressing). If soap and water are not available, use hand sanitizer. °? Change your dressing as told by your health care provider. °? Leave stitches (sutures), skin glue, or adhesive strips in place. These skin closures may need to stay in place for 2 weeks or longer. If adhesive strip edges start to loosen and curl up, you may trim the loose edges. Do not remove adhesive strips completely unless your health care provider tells you to do that. °· Check your port insertion site every day for signs of infection. Check for: °? Redness, swelling, or pain. °? Fluid or blood. °? Warmth. °? Pus or a bad smell. °Activity °· Return to your normal activities as told by your health care provider. Ask your health care provider what activities are safe for you. °· Do not  lift anything that is heavier than 10 lb (4.5 kg), or the limit that you are told, until your health care provider says that it is safe. °General instructions °· Take over-the-counter and prescription medicines only as told by your health care provider. °· Do not take baths, swim, or use a hot tub until your health care provider approves. Ask your health care provider if you may take showers. You may only be allowed to take sponge baths. °· Do not drive for 24 hours if you were given a sedative during your procedure. °· Wear a medical alert bracelet in case of an emergency. This will tell any health care providers that you have a port. °· Keep all follow-up visits as told by your health care provider. This is important. °Contact a health care provider if: °· You cannot flush your port with saline as directed, or you cannot draw blood from the port. °· You have a fever or chills. °· You have redness, swelling, or pain around your port insertion site. °· You have fluid or blood coming from your port insertion site. °· Your port insertion site feels warm to the touch. °· You have pus or a bad smell coming from the port insertion site. °Get help right away if: °· You have chest pain or shortness of breath. °· You have bleeding from your port that you cannot control. °Summary °· Take care of the port as told by your health   care provider. Keep the manufacturer's information card with you at all times. °· Change your dressing as told by your health care provider. °· Contact a health care provider if you have a fever or chills or if you have redness, swelling, or pain around your port insertion site. °· Keep all follow-up visits as told by your health care provider. °This information is not intended to replace advice given to you by your health care provider. Make sure you discuss any questions you have with your health care provider. °Document Released: 02/27/2013 Document Revised: 12/05/2017 Document Reviewed:  12/05/2017 °Elsevier Patient Education © 2020 [employer] ° °Moderate Conscious Sedation, Adult, Care After °These instructions provide you with information about caring for yourself after your procedure. Your health care provider may also give you more specific instructions. Your treatment has been planned according to current medical practices, but problems sometimes occur. Call your health care provider if you have any problems or questions after your procedure. °What can I expect after the procedure? °After your procedure, it is common: °· To feel sleepy for several hours. °· To feel clumsy and have poor balance for several hours. °· To have poor judgment for several hours. °· To vomit if you eat too soon. °Follow these instructions at home: °For at least 24 hours after the procedure: ° °· Do not: °? Participate in activities where you could fall or become injured. °? Drive. °? Use heavy machinery. °? Drink alcohol. °? Take sleeping pills or medicines that cause drowsiness. °? Make important decisions or sign legal documents. °? Take care of children on your own. °· Rest. °Eating and drinking °· Follow the diet recommended by your health care provider. °· If you vomit: °? Drink water, juice, or soup when you can drink without vomiting. °? Make sure you have little or no nausea before eating solid foods. °General instructions °· Have a responsible adult stay with you until you are awake and alert. °· Take over-the-counter and prescription medicines only as told by your health care provider. °· If you smoke, do not smoke without supervision. °· Keep all follow-up visits as told by your health care provider. This is important. °Contact a health care provider if: °· You keep feeling nauseous or you keep vomiting. °· You feel light-headed. °· You develop a rash. °· You have a fever. °Get help right away if: °· You have trouble breathing. °This information is not intended to replace advice given to you by your  health care provider. Make sure you discuss any questions you have with your health care provider. °Document Released: 02/27/2013 Document Revised: 04/21/2017 Document Reviewed: 08/29/2015 °Elsevier Patient Education © 2020 [employer] Inc. ° °

## 2018-12-06 NOTE — H&P (Signed)
Chief Complaint: Patient was seen in consultation today for [facility] a cath placement at the request of [facility]  Referring Physician(s): [name] Lark  Supervising Physician: Markus Daft  Patient Status: [facility] - Out-pt  History of Present Illness: Jalilah Wiltsie is a 63 y.o. female   Peritoneal carcinomatosis New dx Ovarian Cancer 7/1: PERITONEAL/ASCITIC FLUID(SPECIMEN 1 OF 1 COLLECTED 11/21/18): ADENOCARCINOMA.  Ascites-- paracentesis x 2: 7/1 and 12/03/18  Scheduled now to begin chemotherapy tomorrow  For [facility] placement today  Past Medical History:  Diagnosis Date   Anxiety state, unspecified    Diverticulosis    Esophageal reflux    Fibroid tumor    Headache    due to allergies    Hiatal hernia    Hyperlipidemia    IBS (irritable bowel syndrome)    Internal hemorrhoids    Nonspecific elevation of levels of transaminase or lactic acid dehydrogenase (LDH)    Renal stone 07/2013   Tubulovillous adenoma of colon    Varicose veins    superficail thrombophlebitis (left LE)    Past Surgical History:  Procedure Laterality Date   CATARACT EXTRACTION, BILATERAL     cystoscopy with instillation for cystitis  2001   DILATION AND CURETTAGE OF UTERUS     miscarriage   IR PARACENTESIS  12/03/2018   LAPAROSCOPY     adhesions   LITHOTRIPSY  07/2013   x 2   MYOMECTOMY  1992   PARACENTESIS  11/21/2018   abdominal , removed 3.1 Liters     Allergies: Shrimp [shellfish allergy] and Sulfonamide derivatives  Medications: Prior to Admission medications   Medication Sig Start Date End Date Taking? Authorizing Provider  ALPRAZolam (XANAX) 0.25 MG tablet Take 1 tablet (0.25 mg total) by mouth at bedtime as needed for anxiety. Patient taking differently: Take 0.125 mg by mouth at bedtime as needed for anxiety.  11/13/18   Nancy Marus, MD  carboxymethylcellulose (REFRESH PLUS) 0.5 % SOLN Place 1 drop into both eyes 2 (two) times daily as needed (dry  eyes).    [provider]  Cholecalciferol (VITAMIN D3) 25 MCG (1000 UT) CAPS Take 1,000 Units by mouth daily.     [provider]  dexamethasone (DECADRON) 4 MG tablet Take 3 tabs at the night before and 3 tab the morning of chemotherapy, every 3 weeks, by mouth 11/29/18   [name] Lark, MD  esomeprazole (NEXIUM) 20 MG capsule Take 20 mg by mouth daily at 12 noon.    [provider]  ibuprofen (ADVIL) 800 MG tablet Take 1 tablet (800 mg total) by mouth every 8 (eight) hours as needed for moderate pain. For AFTER surgery 11/22/18   Joylene John D, NP  lidocaine-prilocaine (EMLA) cream Apply to affected area once 11/29/18   [name] Lark, MD  Melatonin 5 MG SUBL Place under the tongue daily as needed.    [provider]  Multiple Vitamin (MULTIVITAMIN) capsule Take 1 capsule by mouth daily.    [provider]  ondansetron (ZOFRAN) 8 MG tablet Take 1 tablet (8 mg total) by mouth every 8 (eight) hours as needed for refractory nausea / vomiting. 11/29/18   [name] Lark, MD  oxyCODONE (OXY IR/ROXICODONE) 5 MG immediate release tablet Take 1 tablet (5 mg total) by mouth every 4 (four) hours as needed for severe pain. For AFTER surgery, do not take and drive 11/28/91   Cross, Lenna Sciara D, NP  pantoprazole (PROTONIX) 40 MG tablet Take 1 tablet (40 mg total) by mouth 2 (two) times  daily before a meal. Patient not taking: Reported on 11/16/2018 10/23/18   Pyrtle, Lajuan Lines, MD  prochlorperazine (COMPAZINE) 10 MG tablet Take 1 tablet (10 mg total) by mouth every 6 (six) hours as needed (Nausea or vomiting). 11/29/18   [name] Lark, MD  senna-docusate (SENOKOT-S) 8.6-50 MG tablet Take 2 tablets by mouth at bedtime. For AFTER surgery, do not take if having diarrhea 11/22/18   Joylene John D, NP     Family History  Problem Relation Age of Onset   Lymphoma Father    Heart disease Father    Hypertension Father    Hypertension Mother    Hyperlipidemia Mother    Breast cancer Mother 55    Diabetes Maternal Grandmother    Uterine cancer Maternal Grandmother    Stomach cancer Paternal Grandfather    Colon cancer Neg Hx     Social History   Socioeconomic History   Marital status: Married    Spouse name: Not on file   Number of children: 0   Years of education: Not on file   Highest education level: Not on file  Occupational History   Occupation: Environmental consultant strain: Not on file   Food insecurity    Worry: Not on file    Inability: Not on file   Transportation needs    Medical: Not on file    Non-medical: Not on file  Tobacco Use   Smoking status: Former Smoker    Years: 10.00    Types: Cigarettes    Quit date: 05/23/1980    Years since quitting: 38.5   Smokeless tobacco: Never Used   Tobacco comment: quit 1984  Substance and Sexual Activity   Alcohol use: Yes    Alcohol/week: 0.0 standard drinks    Comment: 2/day, 11-21-2018   no alcodholint he last month    Drug use: No   Sexual activity: Yes    Partners: Male  Lifestyle   Physical activity    Days per week: Not on file    Minutes per session: Not on file   Stress: Not on file  Relationships   Social connections    Talks on phone: Not on file    Gets together: Not on file    Attends religious service: Not on file    Active member of club or organization: Not on file    Attends meetings of clubs or organizations: Not on file    Relationship status: Not on file  Other Topics Concern   Not on file  Social History Narrative   Not on file   Review of Systems: A 12 point ROS discussed and pertinent positives are indicated in the HPI above.  All other systems are negative.  Review of Systems  Constitutional: Positive for activity change, appetite change, fatigue and unexpected weight change.  Respiratory: Negative for cough and shortness of breath.   Cardiovascular: Negative for chest pain.  Gastrointestinal: Positive for abdominal distention and  abdominal pain.  Neurological: Positive for weakness.  Psychiatric/Behavioral: Negative for behavioral problems and confusion.    Vital Signs: BP 131/86    Pulse 85    Temp 98.2 F (36.8 C)    Ht 5\' 3"  (1.6 m)    Wt 140 lb (63.5 kg)    SpO2 94%    BMI 24.80 kg/m   Physical Exam Vitals signs reviewed.  Cardiovascular:     Rate and Rhythm: Normal rate and regular rhythm.  Heart sounds: Normal heart sounds.  Pulmonary:     Effort: Pulmonary effort is normal.     Breath sounds: Normal breath sounds.  Abdominal:     General: There is distension.  Musculoskeletal: Normal range of motion.  Skin:    General: Skin is warm and dry.  Neurological:     Mental Status: She is alert and oriented to person, place, and time.  Psychiatric:        Mood and Affect: Mood normal.        Behavior: Behavior normal.        Thought Content: Thought content normal.        Judgment: Judgment normal.     Imaging: Ct Abdomen Pelvis W Contrast  Result Date: 11/09/2018 CLINICAL DATA:  Mid and lower abdominal pain and bloating for 3 months. EXAM: CT ABDOMEN AND PELVIS WITH CONTRAST TECHNIQUE: Multidetector CT imaging of the abdomen and pelvis was performed using the standard protocol following bolus administration of intravenous contrast. CONTRAST:  190mL OMNIPAQUE IOHEXOL 300 MG/ML  SOLN COMPARISON:  None. FINDINGS: Lower Chest: No acute findings. Hepatobiliary: No hepatic masses identified. Gallbladder is unremarkable. Pancreas:  No mass or inflammatory changes. Spleen: Within normal limits in size and appearance. Adrenals/Urinary Tract: No masses identified. Several small less than 1 cm left renal calculi are identified. No evidence of ureteral calculi or hydronephrosis. Stomach/Bowel: Small hiatal hernia noted. No evidence of obstruction, inflammatory process or abnormal fluid collections. Vascular/Lymphatic: No pathologically enlarged lymph nodes. No abdominal aortic aneurysm. Reproductive: Retroflexed  uterus is seen containing multiple small fibroids, largest in the anterior lower uterine segment measuring 5.5 cm and showing cystic degeneration. A large poorly defined soft tissue mass is seen in the central pelvis which is contiguous with the anterior wall of the retroflexed uterus. This measures 14.7 x 9.5 by 11.3 cm, and is suspicious for ovarian carcinoma, with uterine leiomyosarcoma considered a less likely differential diagnosis. Other: Moderate ascites is seen. Omental soft tissue caking and multiple peritoneal nodules are present in the abdomen and pelvis, consistent with diffuse peritoneal carcinomatosis. Musculoskeletal:  No suspicious bone lesions identified. IMPRESSION: 1. 14.7 cm poorly defined soft tissue mass in central pelvis suspicious for primary ovarian carcinoma, with uterine leiomyosarcoma considered a less likely differential diagnosis. 2. Moderate ascites and diffuse peritoneal carcinomatosis. 3. Several small uterine fibroids. These results will be called to the ordering clinician or representative by the Radiologist Assistant, and communication documented in the PACS or zVision Dashboard. Electronically Signed   By: Marlaine Hind M.D.   On: 11/09/2018 12:51   US Paracentesis  Result Date: 11/21/2018 INDICATION: Patient with history of pelvic mass with peritoneal carcinomatosis, elevated CA 125, ascites. Request made for diagnostic and therapeutic paracentesis. EXAM: ULTRASOUND GUIDED DIAGNOSTIC AND THERAPEUTIC PARACENTESIS MEDICATIONS: None COMPLICATIONS: None immediate. PROCEDURE: Informed written consent was obtained from the patient after a discussion of the risks, benefits and alternatives to treatment. A timeout was performed prior to the initiation of the procedure. Initial ultrasound scanning demonstrates a moderate amount of ascites within the right mid to lower abdominal quadrant. The right mid to lower abdomen was prepped and draped in the usual sterile fashion. 1% lidocaine  was used for local anesthesia. Following this, a 19 gauge, 7-cm, Yueh catheter was introduced. An ultrasound image was saved for documentation purposes. The paracentesis was performed. The catheter was removed and a dressing was applied. The patient tolerated the procedure well without immediate post procedural complication. FINDINGS: A total of approximately 3.1 liters  of slightly hazy, amber fluid was removed. Samples were sent to the laboratory as requested by the clinical team. IMPRESSION: Successful ultrasound-guided diagnostic and therapeutic paracentesis yielding 3.1 liters of peritoneal fluid. Read by: Rowe Robert, PA-C Electronically Signed   By: Sandi Mariscal M.D.   On: 11/21/2018 12:11   Ir Paracentesis  Result Date: 12/03/2018 INDICATION: Patient with history of ovarian cancer, recurrent malignant ascites; request made for therapeutic paracentesis up to 5 liters. EXAM: ULTRASOUND GUIDED THERAPEUTIC PARACENTESIS MEDICATIONS: None COMPLICATIONS: None immediate. PROCEDURE: Informed written consent was obtained from the patient after a discussion of the risks, benefits and alternatives to treatment. A timeout was performed prior to the initiation of the procedure. Initial ultrasound scanning demonstrates a moderate amount of ascites within the left lower abdominal quadrant. The left lower abdomen was prepped and draped in the usual sterile fashion. 1% lidocaine was used for local anesthesia. Following this, a 19 gauge, 7-cm, Yueh catheter was introduced. An ultrasound image was saved for documentation purposes. The paracentesis was performed. The catheter was removed and a dressing was applied. The patient tolerated the procedure well without immediate post procedural complication. FINDINGS: A total of approximately 3.7 liters of yellow fluid was removed. IMPRESSION: Successful ultrasound-guided therapeutic paracentesis yielding 3.7 liters of peritoneal fluid. Read by: Rowe Robert, PA-C Electronically  Signed   By: Lucrezia Europe M.D.   On: 12/03/2018 11:09    Labs:  CBC: Recent Labs    10/30/18 0836 11/21/18 1544 12/06/18 0855  WBC 6.5 7.6 7.9  HGB 14.8 13.5 13.6  HCT 44.4 42.0 41.5  PLT 419.0* 468* 583*    COAGS: Recent Labs    12/06/18 0855  INR 1.0    BMP: Recent Labs    10/30/18 0836 11/21/18 1544  NA 139 138  K 3.8 4.4  CL 104 106  CO2 23 26  GLUCOSE 95 123*  BUN 7 14  CALCIUM 9.1 8.5*  CREATININE 0.54 0.51  GFRNONAA  --  >60  GFRAA  --  >60    LIVER FUNCTION TESTS: Recent Labs    10/30/18 0836 11/21/18 1544  BILITOT 0.8 0.7  AST 26 29  ALT 11 14  ALKPHOS 130* 100  PROT 6.6 6.2*  ALBUMIN 3.9 3.0*    TUMOR MARKERS: No results for input(s): AFPTM, CEA, CA199, CHROMGRNA in the last 8760 hours.  Assessment and Plan:  Ovarian cancer Peritoneal carcinomatosis Malignant ascites Scheduled for chemotherapy tomorrow For Port a cath placement in IR Risks and benefits of image guided port-a-catheter placement was discussed with the patient including, but not limited to bleeding, infection, pneumothorax, or fibrin sheath development and need for additional procedures.  All of the patient's questions were answered, patient is agreeable to proceed. Consent signed and in chart.   Thank you for this interesting consult.  I greatly enjoyed meeting Emeli Goguen and look forward to participating in their care.  A copy of this report was sent to the requesting provider on this date.  Electronically Signed: Lavonia Drafts, PA-C 12/06/2018, 9:47 AM   I spent a total of    25 Minutes in face to face in clinical consultation, greater than 50% of which was counseling/coordinating care for [facility] placement

## 2018-12-07 ENCOUNTER — Inpatient Hospital Stay (HOSPITAL_BASED_OUTPATIENT_CLINIC_OR_DEPARTMENT_OTHER): Payer: Managed Care, Other (non HMO) | Admitting: Medical

## 2018-12-07 ENCOUNTER — Other Ambulatory Visit: Payer: Self-pay

## 2018-12-07 ENCOUNTER — Encounter: Payer: Self-pay | Admitting: Oncology

## 2018-12-07 ENCOUNTER — Inpatient Hospital Stay: Payer: Managed Care, Other (non HMO)

## 2018-12-07 VITALS — BP 138/81 | HR 79 | Temp 98.1°F | Resp 16

## 2018-12-07 DIAGNOSIS — C786 Secondary malignant neoplasm of retroperitoneum and peritoneum: Secondary | ICD-10-CM

## 2018-12-07 DIAGNOSIS — T8090XA Unspecified complication following infusion and therapeutic injection, initial encounter: Secondary | ICD-10-CM

## 2018-12-07 DIAGNOSIS — C569 Malignant neoplasm of unspecified ovary: Secondary | ICD-10-CM

## 2018-12-07 DIAGNOSIS — M545 Low back pain: Secondary | ICD-10-CM | POA: Diagnosis not present

## 2018-12-07 DIAGNOSIS — Z5111 Encounter for antineoplastic chemotherapy: Secondary | ICD-10-CM | POA: Diagnosis not present

## 2018-12-07 MED ORDER — SODIUM CHLORIDE 0.9 % IV SOLN
Freq: Once | INTRAVENOUS | Status: AC
  Administered 2018-12-07: 09:00:00 via INTRAVENOUS
  Filled 2018-12-07: qty 5

## 2018-12-07 MED ORDER — SODIUM CHLORIDE 0.9 % IV SOLN
175.0000 mg/m2 | Freq: Once | INTRAVENOUS | Status: AC
  Administered 2018-12-07: 300 mg via INTRAVENOUS
  Filled 2018-12-07: qty 50

## 2018-12-07 MED ORDER — FAMOTIDINE IN NACL 20-0.9 MG/50ML-% IV SOLN
20.0000 mg | Freq: Once | INTRAVENOUS | Status: AC
  Administered 2018-12-07: 20 mg via INTRAVENOUS

## 2018-12-07 MED ORDER — SODIUM CHLORIDE 0.9% FLUSH
10.0000 mL | INTRAVENOUS | Status: DC | PRN
  Administered 2018-12-07: 15:00:00 10 mL
  Filled 2018-12-07: qty 10

## 2018-12-07 MED ORDER — PALONOSETRON HCL INJECTION 0.25 MG/5ML
INTRAVENOUS | Status: AC
  Filled 2018-12-07: qty 5

## 2018-12-07 MED ORDER — PALONOSETRON HCL INJECTION 0.25 MG/5ML
0.2500 mg | Freq: Once | INTRAVENOUS | Status: AC
  Administered 2018-12-07: 0.25 mg via INTRAVENOUS

## 2018-12-07 MED ORDER — FAMOTIDINE IN NACL 20-0.9 MG/50ML-% IV SOLN
INTRAVENOUS | Status: AC
  Filled 2018-12-07: qty 50

## 2018-12-07 MED ORDER — HEPARIN SOD (PORK) LOCK FLUSH 100 UNIT/ML IV SOLN
500.0000 [IU] | Freq: Once | INTRAVENOUS | Status: AC | PRN
  Administered 2018-12-07: 500 [IU]
  Filled 2018-12-07: qty 5

## 2018-12-07 MED ORDER — DIPHENHYDRAMINE HCL 50 MG/ML IJ SOLN
INTRAMUSCULAR | Status: AC
  Filled 2018-12-07: qty 1

## 2018-12-07 MED ORDER — SODIUM CHLORIDE 0.9 % IV SOLN
Freq: Once | INTRAVENOUS | Status: AC
  Administered 2018-12-07: 09:00:00 via INTRAVENOUS
  Filled 2018-12-07: qty 250

## 2018-12-07 MED ORDER — FAMOTIDINE IN NACL 20-0.9 MG/50ML-% IV SOLN
20.0000 mg | Freq: Once | INTRAVENOUS | Status: AC | PRN
  Administered 2018-12-07: 20 mg via INTRAVENOUS

## 2018-12-07 MED ORDER — SODIUM CHLORIDE 0.9 % IV SOLN
600.0000 mg | Freq: Once | INTRAVENOUS | Status: AC
  Administered 2018-12-07: 600 mg via INTRAVENOUS
  Filled 2018-12-07: qty 60

## 2018-12-07 MED ORDER — DIPHENHYDRAMINE HCL 50 MG/ML IJ SOLN
50.0000 mg | Freq: Once | INTRAMUSCULAR | Status: AC
  Administered 2018-12-07: 50 mg via INTRAVENOUS

## 2018-12-07 NOTE — Progress Notes (Signed)
After first titration, patient began to c/o lower back pain. States she has this pain intermittently; however, this is new since the beginning of her treatment. No acute distress noted. Vital signs WNL. Paclitaxel immediately paused and Sandi Mealy, PA-C notified. Lucianne Lei came to see patient in infusion room. Orders received and medicated as documented in [facility]. Patient verbalized rapid improvement in symptoms; however, did not completely resolve. Patient again stated this discomfort was not unusual to her. Paclitaxel resumed at 50% of previous rate and titrated without further complaints. Paclitaxel infusion completed.

## 2018-12-07 NOTE — Progress Notes (Signed)
Met with Stacy Rivas in the infusion room.  Gave her a wig prescription and resources handout. Advised her to call with any questions. 

## 2018-12-07 NOTE — Patient Instructions (Signed)
[facility] Discharge Instructions for Patients Receiving Chemotherapy  Today you received the following chemotherapy agents: Taxol and Carboplatin.  To help prevent nausea and vomiting after your treatment, we encourage you to take your nausea medication as directed.   If you develop nausea and vomiting that is not controlled by your nausea medication, call the clinic.   BELOW ARE SYMPTOMS THAT SHOULD BE REPORTED IMMEDIATELY:  *FEVER GREATER THAN 100.5 F  *CHILLS WITH OR WITHOUT FEVER  NAUSEA AND VOMITING THAT IS NOT CONTROLLED WITH YOUR NAUSEA MEDICATION  *UNUSUAL SHORTNESS OF BREATH  *UNUSUAL BRUISING OR BLEEDING  TENDERNESS IN MOUTH AND THROAT WITH OR WITHOUT PRESENCE OF ULCERS  *URINARY PROBLEMS  *BOWEL PROBLEMS  UNUSUAL RASH Items with * indicate a potential emergency and should be followed up as soon as possible.  Feel free to call the clinic should you have any questions or concerns. The clinic phone number is [phone].  Please show the CHEMO ALERT CARD at check-in to the Emergency Department and triage nurse.  Paclitaxel injection What is this medicine? PACLITAXEL (PAK li TAX el) is a chemotherapy drug. It targets fast dividing cells, like cancer cells, and causes these cells to die. This medicine is used to treat ovarian cancer, breast cancer, lung cancer, Kaposi's sarcoma, and other cancers. This medicine may be used for other purposes; ask your health care provider or pharmacist if you have questions. COMMON BRAND NAME(S): Onxol, Taxol What should I tell my health care provider before I take this medicine? They need to know if you have any of these conditions:  history of irregular heartbeat  liver disease  low blood counts, like low white cell, platelet, or red cell counts  lung or breathing disease, like asthma  tingling of the fingers or toes, or other nerve disorder  an unusual or allergic reaction to paclitaxel, alcohol,  polyoxyethylated castor oil, other chemotherapy, other medicines, foods, dyes, or preservatives  pregnant or trying to get pregnant  breast-feeding How should I use this medicine? This drug is given as an infusion into a vein. It is administered in a hospital or clinic by a specially trained health care professional. Talk to your pediatrician regarding the use of this medicine in children. Special care may be needed. Overdosage: If you think you have taken too much of this medicine contact a poison control center or emergency room at once. NOTE: This medicine is only for you. Do not share this medicine with others. What if I miss a dose? It is important not to miss your dose. Call your doctor or health care professional if you are unable to keep an appointment. What may interact with this medicine? Do not take this medicine with any of the following medications:  disulfiram  metronidazole This medicine may also interact with the following medications:  antiviral medicines for hepatitis, HIV or AIDS  certain antibiotics like erythromycin and clarithromycin  certain medicines for fungal infections like ketoconazole and itraconazole  certain medicines for seizures like carbamazepine, phenobarbital, phenytoin  gemfibrozil  nefazodone  rifampin  St. John's wort This list may not describe all possible interactions. Give your health care provider a list of all the medicines, herbs, non-prescription drugs, or dietary supplements you use. Also tell them if you smoke, drink alcohol, or use illegal drugs. Some items may interact with your medicine. What should I watch for while using this medicine? Your condition will be monitored carefully while you are receiving this medicine. You will need important   blood work done while you are taking this medicine. This medicine can cause serious allergic reactions. To reduce your risk you will need to take other medicine(s) before treatment with this  medicine. If you experience allergic reactions like skin rash, itching or hives, swelling of the face, lips, or tongue, tell your doctor or health care professional right away. In some cases, you may be given additional medicines to help with side effects. Follow all directions for their use. This drug may make you feel generally unwell. This is not uncommon, as chemotherapy can affect healthy cells as well as cancer cells. Report any side effects. Continue your course of treatment even though you feel ill unless your doctor tells you to stop. Call your doctor or health care professional for advice if you get a fever, chills or sore throat, or other symptoms of a cold or flu. Do not treat yourself. This drug decreases your body's ability to fight infections. Try to avoid being around people who are sick. This medicine may increase your risk to bruise or bleed. Call your doctor or health care professional if you notice any unusual bleeding. Be careful brushing and flossing your teeth or using a toothpick because you may get an infection or bleed more easily. If you have any dental work done, tell your dentist you are receiving this medicine. Avoid taking products that contain aspirin, acetaminophen, ibuprofen, naproxen, or ketoprofen unless instructed by your doctor. These medicines may hide a fever. Do not become pregnant while taking this medicine. Women should inform their doctor if they wish to become pregnant or think they might be pregnant. There is a potential for serious side effects to an unborn child. Talk to your health care professional or pharmacist for more information. Do not breast-feed an infant while taking this medicine. Men are advised not to father a child while receiving this medicine. This product may contain alcohol. Ask your pharmacist or healthcare provider if this medicine contains alcohol. Be sure to tell all healthcare providers you are taking this medicine. Certain medicines,  like metronidazole and disulfiram, can cause an unpleasant reaction when taken with alcohol. The reaction includes flushing, headache, nausea, vomiting, sweating, and increased thirst. The reaction can last from 30 minutes to several hours. What side effects may I notice from receiving this medicine? Side effects that you should report to your doctor or health care professional as soon as possible:  allergic reactions like skin rash, itching or hives, swelling of the face, lips, or tongue  breathing problems  changes in vision  fast, irregular heartbeat  high or low blood pressure  mouth sores  pain, tingling, numbness in the hands or feet  signs of decreased platelets or bleeding - bruising, pinpoint red spots on the skin, black, tarry stools, blood in the urine  signs of decreased red blood cells - unusually weak or tired, feeling faint or lightheaded, falls  signs of infection - fever or chills, cough, sore throat, pain or difficulty passing urine  signs and symptoms of liver injury like dark yellow or brown urine; general ill feeling or flu-like symptoms; light-colored stools; loss of appetite; nausea; right upper belly pain; unusually weak or tired; yellowing of the eyes or skin  swelling of the ankles, feet, hands  unusually slow heartbeat Side effects that usually do not require medical attention (report to your doctor or health care professional if they continue or are bothersome):  diarrhea  hair loss  loss of appetite  muscle or joint   pain  nausea, vomiting  pain, redness, or irritation at site where injected  tiredness This list may not describe all possible side effects. Call your doctor for medical advice about side effects. You may report side effects to FDA at 1-800-FDA-1088. Where should I keep my medicine? This drug is given in a hospital or clinic and will not be stored at home. NOTE: This sheet is a summary. It may not cover all possible information.  If you have questions about this medicine, talk to your doctor, pharmacist, or health care provider.  2020 Elsevier/Gold Standard (2017-01-10 13:14:55) Carboplatin injection What is this medicine? CARBOPLATIN (KAR boe pla tin) is a chemotherapy drug. It targets fast dividing cells, like cancer cells, and causes these cells to die. This medicine is used to treat ovarian cancer and many other cancers. This medicine may be used for other purposes; ask your health care provider or pharmacist if you have questions. COMMON BRAND NAME(S): Paraplatin What should I tell my health care provider before I take this medicine? They need to know if you have any of these conditions:  blood disorders  hearing problems  kidney disease  recent or ongoing radiation therapy  an unusual or allergic reaction to carboplatin, cisplatin, other chemotherapy, other medicines, foods, dyes, or preservatives  pregnant or trying to get pregnant  breast-feeding How should I use this medicine? This drug is usually given as an infusion into a vein. It is administered in a hospital or clinic by a specially trained health care professional. Talk to your pediatrician regarding the use of this medicine in children. Special care may be needed. Overdosage: If you think you have taken too much of this medicine contact a poison control center or emergency room at once. NOTE: This medicine is only for you. Do not share this medicine with others. What if I miss a dose? It is important not to miss a dose. Call your doctor or health care professional if you are unable to keep an appointment. What may interact with this medicine?  medicines for seizures  medicines to increase blood counts like filgrastim, pegfilgrastim, sargramostim  some antibiotics like amikacin, gentamicin, neomycin, streptomycin, tobramycin  vaccines Talk to your doctor or health care professional before taking any of these  medicines:  acetaminophen  aspirin  ibuprofen  ketoprofen  naproxen This list may not describe all possible interactions. Give your health care provider a list of all the medicines, herbs, non-prescription drugs, or dietary supplements you use. Also tell them if you smoke, drink alcohol, or use illegal drugs. Some items may interact with your medicine. What should I watch for while using this medicine? Your condition will be monitored carefully while you are receiving this medicine. You will need important blood work done while you are taking this medicine. This drug may make you feel generally unwell. This is not uncommon, as chemotherapy can affect healthy cells as well as cancer cells. Report any side effects. Continue your course of treatment even though you feel ill unless your doctor tells you to stop. In some cases, you may be given additional medicines to help with side effects. Follow all directions for their use. Call your doctor or health care professional for advice if you get a fever, chills or sore throat, or other symptoms of a cold or flu. Do not treat yourself. This drug decreases your body's ability to fight infections. Try to avoid being around people who are sick. This medicine may increase your risk to bruise or   bleed. Call your doctor or health care professional if you notice any unusual bleeding. Be careful brushing and flossing your teeth or using a toothpick because you may get an infection or bleed more easily. If you have any dental work done, tell your dentist you are receiving this medicine. Avoid taking products that contain aspirin, acetaminophen, ibuprofen, naproxen, or ketoprofen unless instructed by your doctor. These medicines may hide a fever. Do not become pregnant while taking this medicine. Women should inform their doctor if they wish to become pregnant or think they might be pregnant. There is a potential for serious side effects to an unborn child. Talk  to your health care professional or pharmacist for more information. Do not breast-feed an infant while taking this medicine. What side effects may I notice from receiving this medicine? Side effects that you should report to your doctor or health care professional as soon as possible:  allergic reactions like skin rash, itching or hives, swelling of the face, lips, or tongue  signs of infection - fever or chills, cough, sore throat, pain or difficulty passing urine  signs of decreased platelets or bleeding - bruising, pinpoint red spots on the skin, black, tarry stools, nosebleeds  signs of decreased red blood cells - unusually weak or tired, fainting spells, lightheadedness  breathing problems  changes in hearing  changes in vision  chest pain  high blood pressure  low blood counts - This drug may decrease the number of white blood cells, red blood cells and platelets. You may be at increased risk for infections and bleeding.  nausea and vomiting  pain, swelling, redness or irritation at the injection site  pain, tingling, numbness in the hands or feet  problems with balance, talking, walking  trouble passing urine or change in the amount of urine Side effects that usually do not require medical attention (report to your doctor or health care professional if they continue or are bothersome):  hair loss  loss of appetite  metallic taste in the mouth or changes in taste This list may not describe all possible side effects. Call your doctor for medical advice about side effects. You may report side effects to FDA at 1-800-FDA-1088. Where should I keep my medicine? This drug is given in a hospital or clinic and will not be stored at home. NOTE: This sheet is a summary. It may not cover all possible information. If you have questions about this medicine, talk to your doctor, pharmacist, or health care provider.  2020 Elsevier/Gold Standard (2007-08-14 14:38:05)  

## 2018-12-08 ENCOUNTER — Encounter (HOSPITAL_COMMUNITY): Payer: Self-pay

## 2018-12-08 ENCOUNTER — Emergency Department (HOSPITAL_COMMUNITY)
Admission: EM | Admit: 2018-12-08 | Discharge: 2018-12-08 | Disposition: A | Discharge status: Home / Self Care | Payer: Managed Care, Other (non HMO) | Attending: Emergency Medicine | Admitting: Emergency Medicine

## 2018-12-08 ENCOUNTER — Other Ambulatory Visit: Payer: Self-pay

## 2018-12-08 DIAGNOSIS — F419 Anxiety disorder, unspecified: Secondary | ICD-10-CM | POA: Diagnosis not present

## 2018-12-08 DIAGNOSIS — Z79899 Other long term (current) drug therapy: Secondary | ICD-10-CM | POA: Diagnosis not present

## 2018-12-08 DIAGNOSIS — E785 Hyperlipidemia, unspecified: Secondary | ICD-10-CM | POA: Diagnosis not present

## 2018-12-08 DIAGNOSIS — C569 Malignant neoplasm of unspecified ovary: Secondary | ICD-10-CM | POA: Diagnosis not present

## 2018-12-08 DIAGNOSIS — R600 Localized edema: Secondary | ICD-10-CM | POA: Diagnosis not present

## 2018-12-08 DIAGNOSIS — Z9221 Personal history of antineoplastic chemotherapy: Secondary | ICD-10-CM | POA: Insufficient documentation

## 2018-12-08 DIAGNOSIS — K219 Gastro-esophageal reflux disease without esophagitis: Secondary | ICD-10-CM | POA: Diagnosis not present

## 2018-12-08 DIAGNOSIS — Z87891 Personal history of nicotine dependence: Secondary | ICD-10-CM | POA: Insufficient documentation

## 2018-12-08 MED ORDER — ENOXAPARIN SODIUM 100 MG/ML ~~LOC~~ SOLN
95.0000 mg | Freq: Once | SUBCUTANEOUS | Status: AC
  Administered 2018-12-08: 95 mg via SUBCUTANEOUS
  Filled 2018-12-08: qty 0.95

## 2018-12-08 NOTE — ED Provider Notes (Signed)
[facility] DEPT Provider Note   CSN: [id number] Arrival date & time: 12/08/18  2128     History   Chief Complaint Chief Complaint  Patient presents with  . Leg Swelling    HPI Stacy Rivas is a 63 y.o. female with a hx of hyperlipidemia, anxiety, GERD, & ovarian cancer who presents to the ED w/ complaints of LLE swelling which began yesterday. Patient recently started chemotherapy for ovarian cancer- first session was yesterday. She states that during the session she noted mild LLE swelling, however since then this has been progressively worsening w/o alleviating/aggravating factors. No intervention PTA. Denies leg pain, erythema, warmth, numbness, tingling, weakness, or injury. Patient states she has a hx of prior superficial clot in the LLE about 4 years ago- but no hx of prior DVT/PE. Currently on chemo. Denies hemoptysis, recent surgery/trauma, recent long travel, or hormone use. She additionally denies chest pain/dyspnea.        HPI  Past Medical History:  Diagnosis Date  . Anxiety state, unspecified   . Diverticulosis   . Esophageal reflux   . Fibroid tumor   . Headache    due to allergies   . Hiatal hernia   . Hyperlipidemia   . IBS (irritable bowel syndrome)   . Internal hemorrhoids   . Nonspecific elevation of levels of transaminase or lactic acid dehydrogenase (LDH)   . Renal stone 07/2013  . Tubulovillous adenoma of colon   . Varicose veins    superficail thrombophlebitis (left LE)    Patient Active Problem List   Diagnosis Date Noted  . Peritoneal carcinomatosis ([place]) 11/30/2018  . Malignant ascites 11/30/2018  . Goals of care, counseling/discussion 11/30/2018  . Ovarian cancer ([place]) 11/29/2018  . FLATULENCE-GAS-BLOATING 05/29/2008  . ABDOMINAL PAIN RIGHT UPPER QUADRANT 05/29/2008  . ABNORMAL TRANSAMINASE-LFT'S 05/29/2008  . ANXIETY 05/28/2008  . GERD 05/28/2008  . HIATAL HERNIA 05/28/2008  . IBS 05/28/2008     Past Surgical History:  Procedure Laterality Date  . CATARACT EXTRACTION, BILATERAL    . cystoscopy with instillation for cystitis  2001  . DILATION AND CURETTAGE OF UTERUS     miscarriage  . IR IMAGING GUIDED PORT INSERTION  12/06/2018  . IR PARACENTESIS  12/03/2018  . LAPAROSCOPY     adhesions  . LITHOTRIPSY  07/2013   x 2  . MYOMECTOMY  1992  . PARACENTESIS  11/21/2018   abdominal , removed 3.1 Liters      OB History   No obstetric history on file.      Home Medications    Prior to Admission medications   Medication Sig Start Date End Date Taking? Authorizing Provider  ALPRAZolam (XANAX) 0.25 MG tablet Take 1 tablet (0.25 mg total) by mouth at bedtime as needed for anxiety. Patient taking differently: Take 0.125 mg by mouth at bedtime as needed for anxiety.  11/13/18   Nancy Marus, MD  carboxymethylcellulose (REFRESH PLUS) 0.5 % SOLN Place 1 drop into both eyes 2 (two) times daily as needed (dry eyes).    [provider]  Cholecalciferol (VITAMIN D3) 25 MCG (1000 UT) CAPS Take 1,000 Units by mouth daily.     [provider]  dexamethasone (DECADRON) 4 MG tablet Take 3 tabs at the night before and 3 tab the morning of chemotherapy, every 3 weeks, by mouth 11/29/18   [name] Lark, MD  esomeprazole (NEXIUM) 20 MG capsule Take 20 mg by mouth daily at 12 noon.    [provider]  ibuprofen (ADVIL) 800 MG tablet Take 1 tablet (800 mg total) by mouth every 8 (eight) hours as needed for moderate pain. For AFTER surgery 11/22/18   Joylene John D, NP  lidocaine-prilocaine (EMLA) cream Apply to affected area once 11/29/18   [name] Lark, MD  Melatonin 5 MG SUBL Place under the tongue daily as needed.    [provider]  Multiple Vitamin (MULTIVITAMIN) capsule Take 1 capsule by mouth daily.    [provider]  ondansetron (ZOFRAN) 8 MG tablet Take 1 tablet (8 mg total) by mouth every 8 (eight) hours as needed for refractory nausea / vomiting. 11/29/18    [name] Lark, MD  oxyCODONE (OXY IR/ROXICODONE) 5 MG immediate release tablet Take 1 tablet (5 mg total) by mouth every 4 (four) hours as needed for severe pain. For AFTER surgery, do not take and drive 01/25/48   Cross, Lenna Sciara D, NP  pantoprazole (PROTONIX) 40 MG tablet Take 1 tablet (40 mg total) by mouth 2 (two) times daily before a meal. Patient not taking: Reported on 11/16/2018 10/23/18   Pyrtle, Lajuan Lines, MD  prochlorperazine (COMPAZINE) 10 MG tablet Take 1 tablet (10 mg total) by mouth every 6 (six) hours as needed (Nausea or vomiting). 11/29/18   [name] Lark, MD  senna-docusate (SENOKOT-S) 8.6-50 MG tablet Take 2 tablets by mouth at bedtime. For AFTER surgery, do not take if having diarrhea 11/22/18   Joylene John D, NP    Family History Family History  Problem Relation Age of Onset  . Lymphoma Father   . Heart disease Father   . Hypertension Father   . Hypertension Mother   . Hyperlipidemia Mother   . Breast cancer Mother 65  . Diabetes Maternal Grandmother   . Uterine cancer Maternal Grandmother   . Stomach cancer Paternal Grandfather   . Colon cancer Neg Hx     Social History Social History   Tobacco Use  . Smoking status: Former Smoker    Years: 10.00    Types: Cigarettes    Quit date: 05/23/1980    Years since quitting: 38.5  . Smokeless tobacco: Never Used  . Tobacco comment: quit 1984  Substance Use Topics  . Alcohol use: Yes    Alcohol/week: 0.0 standard drinks    Comment: 2/day, 11-21-2018   no alcodholint he last month   . Drug use: No     Allergies   Shrimp [shellfish allergy] and Sulfonamide derivatives   Review of Systems Review of Systems  Constitutional: Negative for chills and fever.  Respiratory: Negative for shortness of breath.        Negative for hemoptysis.   Cardiovascular: Positive for leg swelling. Negative for chest pain.  Gastrointestinal: Negative for abdominal pain, nausea and vomiting.  Musculoskeletal: Negative for myalgias.  Skin:  Negative for color change and wound.  Neurological: Negative for weakness and numbness.  All other systems reviewed and are negative.   Physical Exam Updated Vital Signs BP 130/78 (BP Location: Left Arm)   Pulse 78   Temp 98.1 F (36.7 C) (Oral)   Resp 16   SpO2 96%   Physical Exam Vitals signs and nursing note reviewed.  Constitutional:      General: She is not in acute distress.    Appearance: She is not ill-appearing or toxic-appearing.  HENT:     Head: Normocephalic and atraumatic.  Cardiovascular:     Rate and Rhythm: Normal rate and regular rhythm.     Pulses:  Dorsalis pedis pulses are 2+ on the right side and 2+ on the left side.       Posterior tibial pulses are 2+ on the right side and 2+ on the left side.  Pulmonary:     Effort: Pulmonary effort is normal.     Breath sounds: Normal breath sounds.  Musculoskeletal:     Comments: Lower extremities: LLE has 2+ pitting edema to the lower leg without overlying erythema/warmth/rashes. RLE w/o edema. Symmetric temperatures & coloring to the lower extremities. Intact AROM throughout. Nontender to palpation. Compartments are soft.   Skin:    General: Skin is warm and dry.     Capillary Refill: Capillary refill takes less than 2 seconds.  Neurological:     Mental Status: She is alert.     Comments: Alert. Clear speech. Sensation grossly intact to bilateral lower extremities. 5/5 strength with plantar/dorsiflexion bilaterally. Patient ambulatory.   Psychiatric:        Mood and Affect: Mood normal.        Behavior: Behavior normal.    ED Treatments / Results  Labs (all labs ordered are listed, but only abnormal results are displayed) Labs Reviewed - No data to display  EKG None  Radiology No results found.  Procedures Procedures (including critical care time)  Medications Ordered in ED Medications  enoxaparin (LOVENOX) injection 95 mg (has no administration in time range)    Initial Impression /  Assessment and Plan / ED Course  I have reviewed the triage vital signs and the nursing notes.  Pertinent labs & imaging results that were available during my care of the patient were reviewed by me and considered in my medical decision making (see chart for details).   Patient presents to the ED w/ complaints of LLE swelling.  Nontoxic appearing, no apparent distress, vitals WNL.  Exam w/ 2+ pitting edema to the LLE.  No fever or overlying erythema/warmth- doubt cellulitis.  Atraumatic, non tender, doubt fx/dislocation.  Concern for DVT given patient w/ cancer recently starting chemotherapy. NVI distally.  Exam not consistent w/ phlegmasia cerulea dolens. No chest pain/dyspnea or tachycardia/hypoxia to raise concern for PE.   We unfortunately do not have venous duplex US available at this facility at this time of night. Will administer tx dose of Lovenox W/ instructions to return tomorrow AM for outpatient [place] procedure. She has no hx of prior GI bleeds. I discussed treatment plan, need for follow-up, and return precautions with the patient. Provided opportunity for questions, patient confirmed understanding and is in agreement with plan.   Final Clinical Impressions(s) / ED Diagnoses   Final diagnoses:  Edema of left lower extremity    ED Discharge Orders         Ordered    LE VENOUS     12/08/18 2209           Leafy Kindle 12/08/18 2222    Jola Schmidt, MD 12/08/18 2349

## 2018-12-08 NOTE — Discharge Instructions (Addendum)
You were seen in the ER today left lower extremity swelling.  There is concern that you may have a DVT- otherwise known as a blood clot in your left leg. We have given you a shot of lovenox- a blood thinner- to treat for a possible blood clot in the ER.  We have scheduled you for a ultrasound tomorrow. Please follow the below instructions.   IMPORTANT PATIENT INSTRUCTIONS:  You have been scheduled for an Outpatient Vascular Study at [facility].    If tomorrow is a Saturday, Sunday or holiday, please go to the [facility] Emergency Department Registration Desk at 11 am tomorrow morning and tell them you are there for a vascular study.   If tomorrow is a weekday (Monday-Friday), please go to [facility] [place] at 11 am and tell them you are there for a vascular study.   Return to the Er for new or worsening symptoms including but not limited to worsened swelling, pain, chest pain, trouble breathing, numbness in the left/foot, or any other concerns.

## 2018-12-08 NOTE — ED Triage Notes (Signed)
Pt reports generalized L leg swelling that started earlier today. Pt had chemo yesterday. Denies pain, redness or heat. She states that she has had a blood clot in that leg before. Ambulatory. A&Ox4.

## 2018-12-09 ENCOUNTER — Ambulatory Visit (HOSPITAL_COMMUNITY)
Admission: RE | Admit: 2018-12-09 | Discharge: 2018-12-09 | Disposition: A | Discharge status: Home / Self Care | Payer: Managed Care, Other (non HMO) | Source: Ambulatory Visit | Attending: Emergency Medicine | Admitting: Emergency Medicine

## 2018-12-09 DIAGNOSIS — R609 Edema, unspecified: Secondary | ICD-10-CM | POA: Diagnosis not present

## 2018-12-09 DIAGNOSIS — R600 Localized edema: Secondary | ICD-10-CM | POA: Insufficient documentation

## 2018-12-09 NOTE — Progress Notes (Signed)
VASCULAR LAB PRELIMINARY  PRELIMINARY  PRELIMINARY  PRELIMINARY  Left lower extremity venous duplex completed.    Preliminary report:  See CV proc for preliminary results.   Azelia Reiger, RVT 12/09/2018, 12:01 PM

## 2018-12-10 ENCOUNTER — Telehealth: Payer: Self-pay | Admitting: Oncology

## 2018-12-10 ENCOUNTER — Other Ambulatory Visit: Payer: Self-pay | Admitting: Hematology and Oncology

## 2018-12-10 ENCOUNTER — Telehealth: Payer: Self-pay | Admitting: *Deleted

## 2018-12-10 DIAGNOSIS — C569 Malignant neoplasm of unspecified ovary: Secondary | ICD-10-CM

## 2018-12-10 MED ORDER — TRAMADOL HCL 50 MG PO TABS: 50.0000 mg | ORAL_TABLET | Freq: Four times a day (QID) | ORAL | 0 refills | Status: DC | PRN

## 2018-12-10 NOTE — Telephone Encounter (Signed)
Stacy Rivas left a message asking if it is OK to take Tylenol after Taxol.  She said she was having some soreness from having her port inserted and thought she had read somewhere that you are not supposed to take Tylenol after getting Taxol.

## 2018-12-10 NOTE — Telephone Encounter (Signed)
Called Arnetha Courser for chemotherapy F/U.  Reports spoke with collaborative earlier about fluid, swelling and drainage.   As for chemotherapy denies n/v.  Denies treatment side effects or symptoms.  Bowel and bladder functioning well.  "Dr. Alvy Bimler ordered me to drink Gwendolyn Lima, have only missed one day.  Eating and drinking well.  Instructed to drink 64 oz minimum daily or at least the day before, of and after treatment.   Reviewed mixing Gwendolyn Lima with 8 oz of beverage of choice.  Confirm daily use with provider.  Call for assistance when no BM every three days (constipation) or frequent loose BM's diarrhea.  Denies further questions or needs at this time.  Encouraged to call [phone] Mon -Fri 8:00 am - 4:30 pm or anytime as needed for symptoms, changes or event outside office hours.  "I am to call Dr. Alvy Bimler nurse later today."

## 2018-12-10 NOTE — Telephone Encounter (Signed)
-----   Message from Sinda Du, RN sent at 12/07/2018  8:56 AM EDT ----- Regarding: Dr. Alvy Bimler - 1st chemo f/u 1st chemo f/u

## 2018-12-10 NOTE — Telephone Encounter (Signed)
done

## 2018-12-10 NOTE — Telephone Encounter (Signed)
[facility] [address] 

## 2018-12-10 NOTE — Telephone Encounter (Signed)
[facility] is currently listed as her pharmacy Is that ok if I sent her tramadol there?

## 2018-12-10 NOTE — Telephone Encounter (Signed)
Telephone call received from patient- Swelling in left leg has improved. She reports some lower back pain and abd pain that feels like pins poking her- not numbness. She has tramadol at home and would like to get a refill of this. She would like to  start taking 1/2 tramadol and a tylenol to relieve the pain. Previously she only took 1/2 a tramadol. She is concerned about feelings of euphoria if she takes too much.  Patient is not interested in narcotics at this time. Discussed leg elevation for dependant edema. She notes she has had her feet up but not very elevated. She will alternate heat and ice to her groin for the swelling and discomfort. She does not feel she is needing another paracentesis at this time.

## 2018-12-10 NOTE — Telephone Encounter (Signed)
OK for tylenol and ice

## 2018-12-10 NOTE — Telephone Encounter (Signed)
[facility] to [facility] [address]. I have updated the pharmacy info.

## 2018-12-10 NOTE — Progress Notes (Signed)
    DATE:  12/07/2018                                      X  CHEMO/IMMUNOTHERAPY REACTION           MD:  Dr. [name] Lark   AGENT/BLOOD PRODUCT RECEIVING TODAY:               Taxol   AGENT/BLOOD PRODUCT RECEIVING IMMEDIATELY PRIOR TO REACTION:           Taxol   VS: BP:     128/71   P:       65       SPO2:       96                REACTION(S):          Lower back pain      PREMEDS:      Benadryl 50 mg, Aloxi, Pepcid, Emend and dexamethasone   INTERVENTION: Paclitaxel was paused.  The patient was given Pepcid 20 mg IV x1.   Review of Systems  Review of Systems  Constitutional: Negative for chills, diaphoresis and fever.  HENT: Negative for trouble swallowing and voice change.   Respiratory: Negative for cough, chest tightness, shortness of breath and wheezing.   Cardiovascular: Negative for chest pain and palpitations.  Gastrointestinal: Negative for abdominal pain, constipation, diarrhea, nausea and vomiting.  Musculoskeletal: Positive for back pain. Negative for myalgias.  Neurological: Negative for dizziness, light-headedness and headaches.     Physical Exam  Physical Exam Constitutional:      General: She is not in acute distress.    Appearance: She is not diaphoretic.  HENT:     Head: Normocephalic and atraumatic.  Cardiovascular:     Rate and Rhythm: Normal rate and regular rhythm.     Heart sounds: Normal heart sounds. No murmur. No friction rub. No gallop.   Pulmonary:     Effort: Pulmonary effort is normal. No respiratory distress.     Breath sounds: Normal breath sounds. No wheezing or rales.  Skin:    General: Skin is warm and dry.     Findings: No erythema or rash.  Neurological:     Mental Status: She is alert.     OUTCOME:                 The patient's symptoms resolved after she was given Pepcid 20 mg IV x1.  Paclitaxel was restarted and completed without further issues of concern.   Sandi Mealy, MHS, PA-C

## 2018-12-12 ENCOUNTER — Telehealth: Payer: Self-pay

## 2018-12-12 NOTE — Telephone Encounter (Signed)
-----   Message from [name] Lark, MD sent at 12/12/2018  7:48 AM EDT ----- Regarding: pls call and see if she needs paracentesis?

## 2018-12-12 NOTE — Telephone Encounter (Signed)
Called and given below message. She verbalized understanding.  She does not need a paracentesis. She feels good and is not uncomfortable. She is able to eat with no problems and less edema is her legs. She will the office if she feels she needs a paracentesis.

## 2018-12-13 ENCOUNTER — Other Ambulatory Visit: Payer: Self-pay | Admitting: Hematology and Oncology

## 2018-12-13 ENCOUNTER — Telehealth: Payer: Self-pay | Admitting: Oncology

## 2018-12-13 DIAGNOSIS — R180 Malignant ascites: Secondary | ICD-10-CM

## 2018-12-13 DIAGNOSIS — C569 Malignant neoplasm of unspecified ovary: Secondary | ICD-10-CM

## 2018-12-13 NOTE — Telephone Encounter (Signed)
I placed order for it to be done at [facility], pls call

## 2018-12-13 NOTE — Telephone Encounter (Signed)
[facility] and advised her of paracentesis at [facility] tomorrow at 1 pm with 12:45 arrival.  She verbalized understanding and agreement.

## 2018-12-13 NOTE — Telephone Encounter (Signed)
Stacy Rivas called and said she needs a paracentesis again.  She is started to have pressure when she eats and also pressure under her right arm like before. She is ok to wait until tomorrow.

## 2018-12-14 ENCOUNTER — Encounter (HOSPITAL_COMMUNITY): Payer: Self-pay | Admitting: Student

## 2018-12-14 ENCOUNTER — Other Ambulatory Visit: Payer: Self-pay

## 2018-12-14 ENCOUNTER — Ambulatory Visit (HOSPITAL_COMMUNITY)
Admission: RE | Admit: 2018-12-14 | Discharge: 2018-12-14 | Disposition: A | Discharge status: Home / Self Care | Payer: Managed Care, Other (non HMO) | Source: Ambulatory Visit | Attending: Hematology and Oncology | Admitting: Hematology and Oncology

## 2018-12-14 DIAGNOSIS — R180 Malignant ascites: Secondary | ICD-10-CM | POA: Diagnosis present

## 2018-12-14 DIAGNOSIS — C569 Malignant neoplasm of unspecified ovary: Secondary | ICD-10-CM | POA: Insufficient documentation

## 2018-12-14 HISTORY — PX: IR PARACENTESIS: IMG2679

## 2018-12-14 MED ORDER — LIDOCAINE HCL 1 % IJ SOLN
INTRAMUSCULAR | Status: DC | PRN
  Administered 2018-12-14: 15 mL

## 2018-12-14 MED ORDER — LIDOCAINE HCL 1 % IJ SOLN
INTRAMUSCULAR | Status: AC
  Filled 2018-12-14: qty 20

## 2018-12-14 NOTE — Procedures (Signed)
PROCEDURE SUMMARY:  Successful image-guided paracentesis from the right lateral abdomen.  Yielded 2.9 liters of clear yellow fluid.  No immediate complications.  EBL = 5 mL. Patient tolerated well.   Specimen was not sent for labs.  Claris Pong Voncille Simm PA-C 12/14/2018 1:27 PM

## 2018-12-18 MED FILL — DEXAMETHASONE 4 MG TABLET: 4 | 21 days supply | Qty: 6 | Fill #1

## 2018-12-24 ENCOUNTER — Ambulatory Visit: Payer: Managed Care, Other (non HMO) | Admitting: Gynecologic Oncology

## 2018-12-26 ENCOUNTER — Telehealth: Payer: Self-pay | Admitting: Oncology

## 2018-12-26 NOTE — Telephone Encounter (Signed)
Stacy Rivas called and said she has noticed that her left foot has been swelling and wanted to report it.  She denies having any pain in her left leg or foot.  She said the swelling goes down at night.  She is keeping her leg propped up when she is sitting during the day.  She said she is feeling better with increased appetite and has been more active - she is walking 4-5 mornings per week.

## 2018-12-27 NOTE — Telephone Encounter (Signed)
[facility] with the message below from Dr. Alvy Bimler.  She verbalized agreement and said she really doesn't think she needs a paracentesis now.  Her weight has been steady for the past 4-5 days, she has not been having any pain in her right side like before and she is able to eat.

## 2018-12-27 NOTE — Telephone Encounter (Signed)
It looks like someone already ordered US venous Doppler and ruled out DVT It is probably due to the mass pressing on her abdominal veins causing some swelling Continue walking and leg elevation Does she need paracentesis arranged?

## 2018-12-28 ENCOUNTER — Encounter: Payer: Self-pay | Admitting: Hematology and Oncology

## 2018-12-28 ENCOUNTER — Inpatient Hospital Stay: Payer: Managed Care, Other (non HMO) | Attending: Gynecologic Oncology

## 2018-12-28 ENCOUNTER — Other Ambulatory Visit: Payer: Self-pay

## 2018-12-28 ENCOUNTER — Inpatient Hospital Stay: Payer: Managed Care, Other (non HMO)

## 2018-12-28 ENCOUNTER — Inpatient Hospital Stay (HOSPITAL_BASED_OUTPATIENT_CLINIC_OR_DEPARTMENT_OTHER): Payer: Managed Care, Other (non HMO) | Admitting: Hematology and Oncology

## 2018-12-28 VITALS — BP 134/86 | HR 68 | Temp 98.4°F | Resp 17

## 2018-12-28 DIAGNOSIS — Z5111 Encounter for antineoplastic chemotherapy: Secondary | ICD-10-CM | POA: Diagnosis present

## 2018-12-28 DIAGNOSIS — M7989 Other specified soft tissue disorders: Secondary | ICD-10-CM | POA: Insufficient documentation

## 2018-12-28 DIAGNOSIS — M2550 Pain in unspecified joint: Secondary | ICD-10-CM | POA: Insufficient documentation

## 2018-12-28 DIAGNOSIS — Z79899 Other long term (current) drug therapy: Secondary | ICD-10-CM | POA: Insufficient documentation

## 2018-12-28 DIAGNOSIS — C569 Malignant neoplasm of unspecified ovary: Secondary | ICD-10-CM

## 2018-12-28 DIAGNOSIS — D6481 Anemia due to antineoplastic chemotherapy: Secondary | ICD-10-CM | POA: Insufficient documentation

## 2018-12-28 DIAGNOSIS — G620 Drug-induced polyneuropathy: Secondary | ICD-10-CM | POA: Insufficient documentation

## 2018-12-28 DIAGNOSIS — C786 Secondary malignant neoplasm of retroperitoneum and peritoneum: Secondary | ICD-10-CM | POA: Insufficient documentation

## 2018-12-28 DIAGNOSIS — R180 Malignant ascites: Secondary | ICD-10-CM | POA: Insufficient documentation

## 2018-12-28 LAB — CBC WITH DIFFERENTIAL (CANCER CENTER ONLY)
Abs Immature Granulocytes: 0.04 10*3/uL (ref 0.00–0.07)
Basophils Absolute: 0 10*3/uL (ref 0.0–0.1)
Basophils Relative: 0 %
Eosinophils Absolute: 0 10*3/uL (ref 0.0–0.5)
Eosinophils Relative: 0 %
HCT: 37.1 % (ref 36.0–46.0)
Hemoglobin: 11.9 g/dL — ABNORMAL LOW (ref 12.0–15.0)
Immature Granulocytes: 1 %
Lymphocytes Relative: 11 %
Lymphs Abs: 0.8 10*3/uL (ref 0.7–4.0)
MCH: 27.5 pg (ref 26.0–34.0)
MCHC: 32.1 g/dL (ref 30.0–36.0)
MCV: 85.7 fL (ref 80.0–100.0)
Monocytes Absolute: 0.1 10*3/uL (ref 0.1–1.0)
Monocytes Relative: 1 %
Neutro Abs: 6 10*3/uL (ref 1.7–7.7)
Neutrophils Relative %: 87 %
Platelet Count: 382 10*3/uL (ref 150–400)
RBC: 4.33 MIL/uL (ref 3.87–5.11)
RDW: 15.1 % (ref 11.5–15.5)
WBC Count: 6.9 10*3/uL (ref 4.0–10.5)
nRBC: 0 % (ref 0.0–0.2)

## 2018-12-28 LAB — CMP (CANCER CENTER ONLY)
ALT: 15 U/L (ref 0–44)
AST: 30 U/L (ref 15–41)
Albumin: 2.9 g/dL — ABNORMAL LOW (ref 3.5–5.0)
Alkaline Phosphatase: 158 U/L — ABNORMAL HIGH (ref 38–126)
Anion gap: 12 (ref 5–15)
BUN: 12 mg/dL (ref 8–23)
CO2: 21 mmol/L — ABNORMAL LOW (ref 22–32)
Calcium: 9.3 mg/dL (ref 8.9–10.3)
Chloride: 108 mmol/L (ref 98–111)
Creatinine: 0.57 mg/dL (ref 0.44–1.00)
GFR, Est AFR Am: >60 mL/min (ref >60)
GFR, Estimated: >60 mL/min (ref >60)
Glucose, Bld: 165 mg/dL — ABNORMAL HIGH (ref 70–99)
Potassium: 3.8 mmol/L (ref 3.5–5.1)
Sodium: 141 mmol/L (ref 135–145)
Total Bilirubin: 0.3 mg/dL (ref 0.3–1.2)
Total Protein: 6.5 g/dL (ref 6.5–8.1)

## 2018-12-28 MED ORDER — DIPHENHYDRAMINE HCL 50 MG/ML IJ SOLN
50.0000 mg | Freq: Once | INTRAMUSCULAR | Status: AC
  Administered 2018-12-28: 50 mg via INTRAVENOUS

## 2018-12-28 MED ORDER — SODIUM CHLORIDE 0.9 % IV SOLN
Freq: Once | INTRAVENOUS | Status: AC
  Administered 2018-12-28: 11:00:00 via INTRAVENOUS
  Filled 2018-12-28: qty 250

## 2018-12-28 MED ORDER — FAMOTIDINE IN NACL 20-0.9 MG/50ML-% IV SOLN
INTRAVENOUS | Status: AC
  Filled 2018-12-28: qty 50

## 2018-12-28 MED ORDER — DIPHENHYDRAMINE HCL 50 MG/ML IJ SOLN
INTRAMUSCULAR | Status: AC
  Filled 2018-12-28: qty 1

## 2018-12-28 MED ORDER — PALONOSETRON HCL INJECTION 0.25 MG/5ML
INTRAVENOUS | Status: AC
  Filled 2018-12-28: qty 5

## 2018-12-28 MED ORDER — SODIUM CHLORIDE 0.9 % IV SOLN
Freq: Once | INTRAVENOUS | Status: AC
  Administered 2018-12-28: 11:00:00 via INTRAVENOUS
  Filled 2018-12-28: qty 5

## 2018-12-28 MED ORDER — PALONOSETRON HCL INJECTION 0.25 MG/5ML
0.2500 mg | Freq: Once | INTRAVENOUS | Status: AC
  Administered 2018-12-28: 0.25 mg via INTRAVENOUS

## 2018-12-28 MED ORDER — FAMOTIDINE IN NACL 20-0.9 MG/50ML-% IV SOLN
20.0000 mg | Freq: Once | INTRAVENOUS | Status: AC
  Administered 2018-12-28: 20 mg via INTRAVENOUS

## 2018-12-28 MED ORDER — HEPARIN SOD (PORK) LOCK FLUSH 100 UNIT/ML IV SOLN
500.0000 [IU] | Freq: Once | INTRAVENOUS | Status: AC | PRN
  Administered 2018-12-28: 500 [IU]
  Filled 2018-12-28: qty 5

## 2018-12-28 MED ORDER — SODIUM CHLORIDE 0.9% FLUSH
10.0000 mL | INTRAVENOUS | Status: DC | PRN
  Administered 2018-12-28: 10 mL
  Filled 2018-12-28: qty 10

## 2018-12-28 MED ORDER — DEXAMETHASONE 4 MG PO TABS: 4.0000 mg | ORAL_TABLET | Freq: Every day | ORAL | 0 refills | Status: DC

## 2018-12-28 MED ORDER — SODIUM CHLORIDE 0.9% FLUSH
10.0000 mL | Freq: Once | INTRAVENOUS | Status: AC
  Administered 2018-12-28: 10 mL
  Filled 2018-12-28: qty 10

## 2018-12-28 MED ORDER — LIDOCAINE-PRILOCAINE 2.5-2.5 % EX CREA: TOPICAL_CREAM | CUTANEOUS | 3 refills | Status: DC

## 2018-12-28 MED ORDER — SODIUM CHLORIDE 0.9 % IV SOLN
600.0000 mg | Freq: Once | INTRAVENOUS | Status: AC
  Administered 2018-12-28: 600 mg via INTRAVENOUS
  Filled 2018-12-28: qty 60

## 2018-12-28 MED ORDER — SODIUM CHLORIDE 0.9 % IV SOLN
175.0000 mg/m2 | Freq: Once | INTRAVENOUS | Status: AC
  Administered 2018-12-28: 300 mg via INTRAVENOUS
  Filled 2018-12-28: qty 50

## 2018-12-28 MED FILL — DEXAMETHASONE 4 MG TABLET: 4 | 24 days supply | Qty: 24 | Fill #0

## 2018-12-28 MED FILL — LIDOCAINE-PRILOCAINE CREAM: 2.5-2.5 | 30 days supply | Qty: 30 | Fill #0

## 2018-12-28 NOTE — Patient Instructions (Addendum)
   [facility] Discharge Instructions for Patients Receiving Chemotherapy  Today you received the following chemotherapy agents Taxol and Carboplatin   To help prevent nausea and vomiting after your treatment, we encourage you to take your nausea medication as directed.    If you develop nausea and vomiting that is not controlled by your nausea medication, call the clinic.   BELOW ARE SYMPTOMS THAT SHOULD BE REPORTED IMMEDIATELY:  *FEVER GREATER THAN 100.5 F  *CHILLS WITH OR WITHOUT FEVER  NAUSEA AND VOMITING THAT IS NOT CONTROLLED WITH YOUR NAUSEA MEDICATION  *UNUSUAL SHORTNESS OF BREATH  *UNUSUAL BRUISING OR BLEEDING  TENDERNESS IN MOUTH AND THROAT WITH OR WITHOUT PRESENCE OF ULCERS  *URINARY PROBLEMS  *BOWEL PROBLEMS  UNUSUAL RASH Items with * indicate a potential emergency and should be followed up as soon as possible.  Feel free to call the clinic should you have any questions or concerns. The clinic phone number is [phone].  Please show the CHEMO ALERT CARD at check-in to the Emergency Department and triage nurse.   

## 2018-12-28 NOTE — Assessment & Plan Note (Signed)
She has mild arthralgias after chemo likely secondary to side effects of treatment We discussed conservative management with over-the-counter analgesics and tramadol as needed

## 2018-12-28 NOTE — Progress Notes (Signed)
[facility] OFFICE PROGRESS NOTE  Patient Care Team: Burnard Bunting, MD as PCP - General (Internal Medicine)  ASSESSMENT & PLAN:  Ovarian cancer [facility]) She tolerated treatment well She has lost some weight but I suspect it is due to resolution of ascites Clinically, she appears to be responding well to treatment We will call her next week with results of tumor marker I plan to schedule cycle 3 and cycle 4 of chemotherapy, with plan to order CT imaging after cycle 3 for objective assessment of response of therapy  Malignant ascites She has minimum palpable residual ascites on exam but due to lack of symptoms, I do not recommend paracentesis unless she starts to feel uncomfortable  Arthralgia She has mild arthralgias after chemo likely secondary to side effects of treatment We discussed conservative management with over-the-counter analgesics and tramadol as needed  Leg swelling She has intermittent left leg edema likely secondary to fluid retention and compression of venous return from the pelvic mass Ultrasound venous Doppler rule out lower extremity DVT We discussed conservative management and observation only   No orders of the defined types were placed in this encounter.   INTERVAL HISTORY: Please see below for problem oriented charting. She returns for cycle 2 of treatment She felt much better since last time I saw her She has less abdominal bloating She has lost some weight but overall she thought that the weight loss is due to resolution of ascites She denies peripheral neuropathy She responded well to tramadol to take as needed for intermittent arthralgia She has some insomnia but does have medication to take at home. She denies nausea Denies constipation  SUMMARY OF ONCOLOGIC HISTORY: Oncology History  Ovarian cancer ([place])  11/09/2018 Imaging   1. 14.7 cm poorly defined soft tissue mass in central pelvis suspicious for primary ovarian carcinoma,  with uterine leiomyosarcoma considered a less likely differential diagnosis. 2. Moderate ascites and diffuse peritoneal carcinomatosis. 3. Several small uterine fibroids.   11/13/2018 Tumor Marker   Patient's tumor was tested for the following markers: CA-125 Results of the tumor marker test revealed 1434   11/21/2018 Procedure   Successful ultrasound-guided diagnostic and therapeutic paracentesis yielding 3.1 liters of peritoneal fluid   11/21/2018 Pathology Results   PERITONEAL/ASCITIC FLUID(SPECIMEN 1 OF 1 COLLECTED 11/21/18): ADENOCARCINOMA. Specimen Clinical Information Pelvic mass suspicious for ovarian cancer Source Peritoneal/Ascitic Fluid, (specimen 1 of 1 collected 11/21/18) Gross Specimen: Received is/are 1000 ccs of dark amber fluid. (CM:cm) Prepared: # Smears: 0 # Concentration Technique Slides (i.e. 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   11/29/2018 Initial Diagnosis   Ovarian cancer ([place])   11/29/2018 Cancer Staging   Staging form: Ovary, Fallopian Tube, and Primary Peritoneal Carcinoma, AJCC 8th Edition - Clinical: cT3, cN0, cM0 - Signed by [name] Lark, MD on 11/29/2018   12/03/2018 Procedure   Successful ultrasound-guided therapeutic paracentesis yielding 3.7 liters of peritoneal fluid.     12/06/2018 Procedure   Placement of a subcutaneous port device. Catheter tip at the SVC and right atrium junction   12/14/2018 Procedure   Successful ultrasound-guided paracentesis yielding 2.9 L of peritoneal fluid     REVIEW OF SYSTEMS:   Constitutional: Denies fevers, chills or abnormal weight loss Eyes: Denies blurriness of vision Ears, nose, mouth, throat, and face: Denies mucositis or sore throat Respiratory: Denies cough, dyspnea or wheezes Cardiovascular: Denies palpitation, chest discomfort or lower extremity swelling Gastrointestinal:  Denies  nausea, heartburn or change in  bowel habits Skin: Denies abnormal skin rashes Lymphatics: Denies new lymphadenopathy or easy bruising Neurological:Denies numbness, tingling or new weaknesses Behavioral/Psych: Mood is stable, no new changes  All other systems were reviewed with the patient and are negative.  I have reviewed the past medical history, past surgical history, social history and family history with the patient and they are unchanged from previous note.  ALLERGIES:  is allergic to shrimp [shellfish allergy] and sulfonamide derivatives.  MEDICATIONS:  Current Outpatient Medications  Medication Sig Dispense Refill  . ALPRAZolam (XANAX) 0.25 MG tablet Take 1 tablet (0.25 mg total) by mouth at bedtime as needed for anxiety. (Patient taking differently: Take 0.125 mg by mouth at bedtime as needed for anxiety. ) 15 tablet 0  . carboxymethylcellulose (REFRESH PLUS) 0.5 % SOLN Place 1 drop into both eyes 2 (two) times daily as needed (dry eyes).    . Cholecalciferol (VITAMIN D3) 25 MCG (1000 UT) CAPS Take 1,000 Units by mouth daily.     [name] Kitchen dexamethasone (DECADRON) 4 MG tablet Take 1 tablet (4 mg total) by mouth daily. 24 tablet 0  . esomeprazole (NEXIUM) 20 MG capsule Take 20 mg by mouth daily at 12 noon.    [name] Kitchen ibuprofen (ADVIL) 800 MG tablet Take 1 tablet (800 mg total) by mouth every 8 (eight) hours as needed for moderate pain. For AFTER surgery 30 tablet 0  . lidocaine-prilocaine (EMLA) cream Apply to affected area once 30 g 3  . Melatonin 5 MG SUBL Place under the tongue daily as needed.    . Multiple Vitamin (MULTIVITAMIN) capsule Take 1 capsule by mouth daily.    . ondansetron (ZOFRAN) 8 MG tablet Take 1 tablet (8 mg total) by mouth every 8 (eight) hours as needed for refractory nausea / vomiting. 30 tablet 1  . oxyCODONE (OXY IR/ROXICODONE) 5 MG immediate release tablet Take 1 tablet (5 mg total) by mouth every 4 (four) hours as needed for severe pain. For AFTER surgery, do not take and drive 15 tablet 0  .  pantoprazole (PROTONIX) 40 MG tablet Take 1 tablet (40 mg total) by mouth 2 (two) times daily before a meal. (Patient not taking: Reported on 11/16/2018) 60 tablet 1  . prochlorperazine (COMPAZINE) 10 MG tablet Take 1 tablet (10 mg total) by mouth every 6 (six) hours as needed (Nausea or vomiting). 30 tablet 1  . senna-docusate (SENOKOT-S) 8.6-50 MG tablet Take 2 tablets by mouth at bedtime. For AFTER surgery, do not take if having diarrhea 30 tablet 1  . traMADol (ULTRAM) 50 MG tablet Take 1 tablet (50 mg total) by mouth every 6 (six) hours as needed. 30 tablet 0   No current facility-administered medications for this visit.    Facility-Administered Medications Ordered in Other Visits  Medication Dose Route Frequency Provider Last Rate Last Dose  . CARBOplatin (PARAPLATIN) 600 mg in sodium chloride 0.9 % 250 mL chemo infusion  600 mg Intravenous Once Alvy Bimler, Brayten Komar, MD      . heparin lock flush 100 unit/mL  500 Units Intracatheter Once PRN Alvy Bimler, Talmadge Ganas, MD      . PACLitaxel (TAXOL) 300 mg in sodium chloride 0.9 % 250 mL chemo infusion (> 80mg /m2)  175 mg/m2 (Treatment Plan Recorded) Intravenous Once [name] Lark, MD 100 mL/hr at 12/28/18 1312    . sodium chloride flush (NS) 0.9 % injection 10 mL  10 mL Intracatheter PRN [name] Lark, MD        PHYSICAL EXAMINATION: ECOG  PERFORMANCE STATUS: 1 - Symptomatic but completely ambulatory  Vitals:   12/28/18 0943  BP: (!) 132/93  Pulse: 92  Resp: 18  Temp: 98.3 F (36.8 C)  SpO2: 98%   Filed Weights   12/28/18 0943  Weight: 138 lb 6.4 oz (62.8 kg)    GENERAL:alert, no distress and comfortable SKIN: skin color, texture, turgor are normal, no rashes or significant lesions EYES: normal, Conjunctiva are pink and non-injected, sclera clear OROPHARYNX:no exudate, no erythema and lips, buccal mucosa, and tongue normal  NECK: supple, thyroid normal size, non-tender, without nodularity LYMPH:  no palpable lymphadenopathy in the cervical, axillary or  inguinal LUNGS: clear to auscultation and percussion with normal breathing effort HEART: regular rate & rhythm and no murmurs with minimum left lower extremity edema ABDOMEN:abdomen soft, non-tender and normal bowel sounds.  She has small palpable residual ascites but not as much as before Musculoskeletal:no cyanosis of digits and no clubbing  NEURO: alert & oriented x 3 with fluent speech, no focal motor/sensory deficits  LABORATORY DATA:  I have reviewed the data as listed    Component Value Date/Time   NA 141 12/28/2018 0921   K 3.8 12/28/2018 0921   CL 108 12/28/2018 0921   CO2 21 (L) 12/28/2018 0921   GLUCOSE 165 (H) 12/28/2018 0921   BUN 12 12/28/2018 0921   CREATININE 0.57 12/28/2018 0921   CALCIUM 9.3 12/28/2018 0921   PROT 6.5 12/28/2018 0921   ALBUMIN 2.9 (L) 12/28/2018 0921   AST 30 12/28/2018 0921   ALT 15 12/28/2018 0921   ALKPHOS 158 (H) 12/28/2018 0921   BILITOT 0.3 12/28/2018 0921   GFRNONAA >60 12/28/2018 0921   GFRAA >60 12/28/2018 0921    No results found for: SPEP, UPEP  Lab Results  Component Value Date   WBC 6.9 12/28/2018   NEUTROABS 6.0 12/28/2018   HGB 11.9 (L) 12/28/2018   HCT 37.1 12/28/2018   MCV 85.7 12/28/2018   PLT 382 12/28/2018      Chemistry      Component Value Date/Time   NA 141 12/28/2018 0921   K 3.8 12/28/2018 0921   CL 108 12/28/2018 0921   CO2 21 (L) 12/28/2018 0921   BUN 12 12/28/2018 0921   CREATININE 0.57 12/28/2018 0921      Component Value Date/Time   CALCIUM 9.3 12/28/2018 0921   ALKPHOS 158 (H) 12/28/2018 0921   AST 30 12/28/2018 0921   ALT 15 12/28/2018 0921   BILITOT 0.3 12/28/2018 0921       RADIOGRAPHIC STUDIES: I have personally reviewed the radiological images as listed and agreed with the findings in the report. Ir Imaging Guided Port Insertion  Result Date: 12/06/2018 INDICATION: 63 year old with ovarian cancer. Port-A-Cath needed for chemotherapy. EXAM: FLUOROSCOPIC AND ULTRASOUND GUIDED  PLACEMENT OF A SUBCUTANEOUS PORT COMPARISON:  None. MEDICATIONS: Ancef 2 g; The antibiotic was administered within an appropriate time interval prior to skin puncture. ANESTHESIA/SEDATION: Versed 2.0 mg IV; Fentanyl 100 mcg IV; Moderate Sedation Time:  30 minutes The patient was continuously monitored during the procedure by the interventional radiology nurse under my direct supervision. FLUOROSCOPY TIME:  12 seconds, 1 mGy COMPLICATIONS: None immediate. PROCEDURE: The procedure, risks, benefits, and alternatives were explained to the patient. Questions regarding the procedure were encouraged and answered. The patient understands and consents to the procedure. Patient was placed supine on the interventional table. Ultrasound confirmed a patent right internal jugular vein. Ultrasound image was saved for documentation. The right chest and neck  were cleaned with a skin antiseptic and a sterile drape was placed. Maximal barrier sterile technique was utilized including caps, mask, sterile gowns, sterile gloves, sterile drape, hand hygiene and skin antiseptic. The right neck was anesthetized with 1% lidocaine. Small incision was made in the right neck with a blade. Micropuncture set was placed in the right internal jugular vein with ultrasound guidance. The micropuncture wire was used for measurement purposes. The right chest was anesthetized with 1% lidocaine with epinephrine. #15 blade was used to make an incision and a subcutaneous port pocket was formed. [place] was assembled. Subcutaneous tunnel was formed with a stiff tunneling device. The port catheter was brought through the subcutaneous tunnel. The port was placed in the subcutaneous pocket and sutured in place. The micropuncture set was exchanged for a peel-away sheath. The catheter was placed through the peel-away sheath and the tip was positioned at the SVC and right atrium junction. Catheter placement was confirmed with fluoroscopy. The port was  accessed and flushed with heparinized saline. The port pocket was closed using two layers of absorbable sutures and Dermabond. The vein skin site was closed using a single layer of absorbable suture and Dermabond. Sterile dressings were applied. Patient tolerated the procedure well without an immediate complication. Ultrasound and fluoroscopic images were taken and saved for this procedure. IMPRESSION: Placement of a subcutaneous port device. Catheter tip at the SVC and right atrium junction. Electronically Signed   By: Markus Daft M.D.   On: 12/06/2018 17:00   Le Venous  Result Date: 12/09/2018  Lower Venous Study Indications: Edema.  Risk Factors: Cancer Ovarian, status post first chemo treatment. Comparison Study: Prior negative LLE venous duplex is available for comparison                   from 11/26/2015 Performing Technologist: Sharion Dove RVS  Examination Guidelines: A complete evaluation includes B-mode imaging, spectral Doppler, color Doppler, and power Doppler as needed of all accessible portions of each vessel. Bilateral testing is considered an integral part of a complete examination. Limited examinations for reoccurring indications may be performed as noted.  +-----+---------------+---------+-----------+----------+-------+ RIGHTCompressibilityPhasicitySpontaneityPropertiesSummary +-----+---------------+---------+-----------+----------+-------+ CFV  Full           Yes      Yes                          +-----+---------------+---------+-----------+----------+-------+   +---------+---------------+---------+-----------+----------+-------------------+ LEFT     CompressibilityPhasicitySpontaneityPropertiesSummary             +---------+---------------+---------+-----------+----------+-------------------+ CFV      Full           Yes      Yes                  sluggish flow noted +---------+---------------+---------+-----------+----------+-------------------+ SFJ      Full                                                              +---------+---------------+---------+-----------+----------+-------------------+ FV Prox  Full                                                             +---------+---------------+---------+-----------+----------+-------------------+  FV Mid   Full                                                             +---------+---------------+---------+-----------+----------+-------------------+ FV DistalFull                                                             +---------+---------------+---------+-----------+----------+-------------------+ PFV      Full                                                             +---------+---------------+---------+-----------+----------+-------------------+ POP      Full           Yes      Yes                  sluggish flow noted +---------+---------------+---------+-----------+----------+-------------------+ PTV      Full                                                             +---------+---------------+---------+-----------+----------+-------------------+ PERO     Full                                                             +---------+---------------+---------+-----------+----------+-------------------+     Summary: Right: No evidence of common femoral vein obstruction. Left: There is no evidence of deep vein thrombosis in the lower extremity.  *See table(s) above for measurements and observations. Electronically signed by Monica Martinez MD on 12/09/2018 at 4:38:06 PM.    Final    Ir Paracentesis  Result Date: 12/14/2018 INDICATION: Patient with history of ovarian cancer, abdominal distension, and recurrent malignant ascites. Request is made for therapeutic paracentesis with a max of 5 L. EXAM: ULTRASOUND GUIDED THERAPEUTIC PARACENTESIS MEDICATIONS: 15 mL 1% lidocaine COMPLICATIONS: None immediate. PROCEDURE: Informed written consent was obtained from the  patient after a discussion of the risks, benefits and alternatives to treatment. A timeout was performed prior to the initiation of the procedure. Initial ultrasound scanning demonstrates a large amount of ascites within the right lower abdominal quadrant. The right lower abdomen was prepped and draped in the usual sterile fashion. 1% lidocaine was used for local anesthesia. Following this, a 19 gauge, 7-cm, Yueh catheter was introduced. An ultrasound image was saved for documentation purposes. The paracentesis was performed. The catheter was removed and a dressing was applied. The patient tolerated the procedure well without immediate post procedural complication. FINDINGS: A total of approximately 2.9 L of clear yellow fluid was removed. IMPRESSION: Successful ultrasound-guided paracentesis yielding  2.9 L of peritoneal fluid. Read by: Earley Abide, PA-C Electronically Signed   By: Corrie Mckusick D.O.   On: 12/14/2018 13:58   Ir Paracentesis  Result Date: 12/03/2018 INDICATION: Patient with history of ovarian cancer, recurrent malignant ascites; request made for therapeutic paracentesis up to 5 liters. EXAM: ULTRASOUND GUIDED THERAPEUTIC PARACENTESIS MEDICATIONS: None COMPLICATIONS: None immediate. PROCEDURE: Informed written consent was obtained from the patient after a discussion of the risks, benefits and alternatives to treatment. A timeout was performed prior to the initiation of the procedure. Initial ultrasound scanning demonstrates a moderate amount of ascites within the left lower abdominal quadrant. The left lower abdomen was prepped and draped in the usual sterile fashion. 1% lidocaine was used for local anesthesia. Following this, a 19 gauge, 7-cm, Yueh catheter was introduced. An ultrasound image was saved for documentation purposes. The paracentesis was performed. The catheter was removed and a dressing was applied. The patient tolerated the procedure well without immediate post procedural  complication. FINDINGS: A total of approximately 3.7 liters of yellow fluid was removed. IMPRESSION: Successful ultrasound-guided therapeutic paracentesis yielding 3.7 liters of peritoneal fluid. Read by: Rowe Robert, PA-C Electronically Signed   By: Lucrezia Europe M.D.   On: 12/03/2018 11:09    All questions were answered. The patient knows to call the clinic with any problems, questions or concerns. No barriers to learning was detected.  I spent 15 minutes counseling the patient face to face. The total time spent in the appointment was 20 minutes and more than 50% was on counseling and review of test results  [name] Lark, MD 12/28/2018 2:07 PM

## 2018-12-28 NOTE — Assessment & Plan Note (Signed)
She has minimum palpable residual ascites on exam but due to lack of symptoms, I do not recommend paracentesis unless she starts to feel uncomfortable

## 2018-12-28 NOTE — Assessment & Plan Note (Signed)
She tolerated treatment well She has lost some weight but I suspect it is due to resolution of ascites Clinically, she appears to be responding well to treatment We will call her next week with results of tumor marker I plan to schedule cycle 3 and cycle 4 of chemotherapy, with plan to order CT imaging after cycle 3 for objective assessment of response of therapy

## 2018-12-28 NOTE — Assessment & Plan Note (Signed)
She has intermittent left leg edema likely secondary to fluid retention and compression of venous return from the pelvic mass Ultrasound venous Doppler rule out lower extremity DVT We discussed conservative management and observation only

## 2018-12-28 NOTE — Progress Notes (Signed)
Pt had a reaction to taxol during first infusion. Will run taxol infusion as a first-time infusion today.

## 2018-12-29 LAB — CA 125: Cancer Antigen (CA) 125: 812 U/mL — ABNORMAL HIGH (ref 0.0–38.1)

## 2018-12-31 ENCOUNTER — Telehealth: Payer: Self-pay | Admitting: Hematology and Oncology

## 2018-12-31 ENCOUNTER — Telehealth: Payer: Self-pay | Admitting: *Deleted

## 2018-12-31 NOTE — Telephone Encounter (Signed)
-----   Message from [name] Lark, MD sent at 12/31/2018  9:12 AM EDT ----- Regarding: CA-125 is better, let her know

## 2018-12-31 NOTE — Telephone Encounter (Signed)
Patient called and advised lab results, patient verbalized an understanding.

## 2018-12-31 NOTE — Telephone Encounter (Signed)
I talk with patient regarding schedule  

## 2019-01-07 ENCOUNTER — Telehealth: Payer: Self-pay | Admitting: Oncology

## 2019-01-07 NOTE — Telephone Encounter (Signed)
[facility] with appointment to follow up with Dr. Denman George on 02/04/19 at 12:15.  She verbalized understanding and agreement.

## 2019-01-17 ENCOUNTER — Inpatient Hospital Stay (HOSPITAL_BASED_OUTPATIENT_CLINIC_OR_DEPARTMENT_OTHER): Payer: Managed Care, Other (non HMO) | Admitting: Hematology and Oncology

## 2019-01-17 ENCOUNTER — Inpatient Hospital Stay: Payer: Managed Care, Other (non HMO)

## 2019-01-17 ENCOUNTER — Ambulatory Visit: Payer: Managed Care, Other (non HMO) | Admitting: Hematology and Oncology

## 2019-01-17 ENCOUNTER — Encounter: Payer: Self-pay | Admitting: Hematology and Oncology

## 2019-01-17 ENCOUNTER — Other Ambulatory Visit: Payer: Self-pay

## 2019-01-17 VITALS — BP 141/77 | HR 94 | Temp 98.2°F | Resp 20 | Ht 62.0 in | Wt 134.6 lb

## 2019-01-17 DIAGNOSIS — D6481 Anemia due to antineoplastic chemotherapy: Secondary | ICD-10-CM

## 2019-01-17 DIAGNOSIS — C569 Malignant neoplasm of unspecified ovary: Secondary | ICD-10-CM

## 2019-01-17 DIAGNOSIS — G620 Drug-induced polyneuropathy: Secondary | ICD-10-CM | POA: Diagnosis not present

## 2019-01-17 DIAGNOSIS — T451X5A Adverse effect of antineoplastic and immunosuppressive drugs, initial encounter: Secondary | ICD-10-CM

## 2019-01-17 DIAGNOSIS — Z5111 Encounter for antineoplastic chemotherapy: Secondary | ICD-10-CM | POA: Diagnosis not present

## 2019-01-17 LAB — CBC WITH DIFFERENTIAL (CANCER CENTER ONLY)
Abs Immature Granulocytes: 0.04 10*3/uL (ref 0.00–0.07)
Basophils Absolute: 0 10*3/uL (ref 0.0–0.1)
Basophils Relative: 0 %
Eosinophils Absolute: 0 10*3/uL (ref 0.0–0.5)
Eosinophils Relative: 0 %
HCT: 35.1 % — ABNORMAL LOW (ref 36.0–46.0)
Hemoglobin: 11.2 g/dL — ABNORMAL LOW (ref 12.0–15.0)
Immature Granulocytes: 1 %
Lymphocytes Relative: 14 %
Lymphs Abs: 0.8 10*3/uL (ref 0.7–4.0)
MCH: 27.5 pg (ref 26.0–34.0)
MCHC: 31.9 g/dL (ref 30.0–36.0)
MCV: 86 fL (ref 80.0–100.0)
Monocytes Absolute: 0.1 10*3/uL (ref 0.1–1.0)
Monocytes Relative: 1 %
Neutro Abs: 4.5 10*3/uL (ref 1.7–7.7)
Neutrophils Relative %: 84 %
Platelet Count: 268 10*3/uL (ref 150–400)
RBC: 4.08 MIL/uL (ref 3.87–5.11)
RDW: 18.2 % — ABNORMAL HIGH (ref 11.5–15.5)
WBC Count: 5.3 10*3/uL (ref 4.0–10.5)
nRBC: 0 % (ref 0.0–0.2)

## 2019-01-17 LAB — CMP (CANCER CENTER ONLY)
ALT: 26 U/L (ref 0–44)
AST: 37 U/L (ref 15–41)
Albumin: 3.8 g/dL (ref 3.5–5.0)
Alkaline Phosphatase: 157 U/L — ABNORMAL HIGH (ref 38–126)
Anion gap: 10 (ref 5–15)
BUN: 12 mg/dL (ref 8–23)
CO2: 23 mmol/L (ref 22–32)
Calcium: 9.3 mg/dL (ref 8.9–10.3)
Chloride: 108 mmol/L (ref 98–111)
Creatinine: 0.65 mg/dL (ref 0.44–1.00)
GFR, Est AFR Am: >60 mL/min (ref >60)
GFR, Estimated: >60 mL/min (ref >60)
Glucose, Bld: 188 mg/dL — ABNORMAL HIGH (ref 70–99)
Potassium: 3.7 mmol/L (ref 3.5–5.1)
Sodium: 141 mmol/L (ref 135–145)
Total Bilirubin: 0.6 mg/dL (ref 0.3–1.2)
Total Protein: 6.9 g/dL (ref 6.5–8.1)

## 2019-01-17 MED ORDER — SODIUM CHLORIDE 0.9 % IV SOLN
600.0000 mg | Freq: Once | INTRAVENOUS | Status: AC
  Administered 2019-01-17: 600 mg via INTRAVENOUS
  Filled 2019-01-17: qty 60

## 2019-01-17 MED ORDER — SODIUM CHLORIDE 0.9% FLUSH
10.0000 mL | INTRAVENOUS | Status: DC | PRN
  Administered 2019-01-17: 10 mL
  Filled 2019-01-17: qty 10

## 2019-01-17 MED ORDER — DIPHENHYDRAMINE HCL 50 MG/ML IJ SOLN
INTRAMUSCULAR | Status: AC
  Filled 2019-01-17: qty 1

## 2019-01-17 MED ORDER — PALONOSETRON HCL INJECTION 0.25 MG/5ML
0.2500 mg | Freq: Once | INTRAVENOUS | Status: AC
  Administered 2019-01-17: 0.25 mg via INTRAVENOUS

## 2019-01-17 MED ORDER — FAMOTIDINE IN NACL 20-0.9 MG/50ML-% IV SOLN
INTRAVENOUS | Status: AC
  Filled 2019-01-17: qty 50

## 2019-01-17 MED ORDER — DIPHENHYDRAMINE HCL 50 MG/ML IJ SOLN
50.0000 mg | Freq: Once | INTRAMUSCULAR | Status: AC
  Administered 2019-01-17: 11:00:00 50 mg via INTRAVENOUS

## 2019-01-17 MED ORDER — HEPARIN SOD (PORK) LOCK FLUSH 100 UNIT/ML IV SOLN
500.0000 [IU] | Freq: Once | INTRAVENOUS | Status: AC | PRN
  Administered 2019-01-17: 16:00:00 500 [IU]
  Filled 2019-01-17: qty 5

## 2019-01-17 MED ORDER — SODIUM CHLORIDE 0.9 % IV SOLN
Freq: Once | INTRAVENOUS | Status: AC
  Administered 2019-01-17: 12:00:00 via INTRAVENOUS
  Filled 2019-01-17: qty 5

## 2019-01-17 MED ORDER — SODIUM CHLORIDE 0.9 % IV SOLN
175.0000 mg/m2 | Freq: Once | INTRAVENOUS | Status: AC
  Administered 2019-01-17: 300 mg via INTRAVENOUS
  Filled 2019-01-17: qty 50

## 2019-01-17 MED ORDER — FAMOTIDINE IN NACL 20-0.9 MG/50ML-% IV SOLN
20.0000 mg | Freq: Once | INTRAVENOUS | Status: AC
  Administered 2019-01-17: 11:00:00 20 mg via INTRAVENOUS

## 2019-01-17 MED ORDER — SODIUM CHLORIDE 0.9% FLUSH
10.0000 mL | Freq: Once | INTRAVENOUS | Status: AC
  Administered 2019-01-17: 10:00:00 10 mL
  Filled 2019-01-17: qty 10

## 2019-01-17 MED ORDER — PALONOSETRON HCL INJECTION 0.25 MG/5ML
INTRAVENOUS | Status: AC
  Filled 2019-01-17: qty 5

## 2019-01-17 MED ORDER — SODIUM CHLORIDE 0.9 % IV SOLN
Freq: Once | INTRAVENOUS | Status: AC
  Administered 2019-01-17: 11:00:00 via INTRAVENOUS
  Filled 2019-01-17: qty 250

## 2019-01-17 NOTE — Assessment & Plan Note (Signed)
This is likely due to recent treatment. The patient denies recent history of bleeding such as epistaxis, hematuria or hematochezia. She is asymptomatic from the anemia. I will observe for now.   

## 2019-01-17 NOTE — Assessment & Plan Note (Signed)
She tolerated last cycle of treatment well except for mild peripheral neuropathy Clinically, she has positive response to treatment The weight loss seen is likely due to resolution of ascites Recent tumor marker is favorable I have plan to order CT imaging next month for objective assessment of response of therapy If she has good response to treatment, we will hold cycle 4 of treatment and she will undergo interval debulking surgery

## 2019-01-17 NOTE — Patient Instructions (Signed)
   [facility] Discharge Instructions for Patients Receiving Chemotherapy  Today you received the following chemotherapy agents Taxol and Carboplatin   To help prevent nausea and vomiting after your treatment, we encourage you to take your nausea medication as directed.    If you develop nausea and vomiting that is not controlled by your nausea medication, call the clinic.   BELOW ARE SYMPTOMS THAT SHOULD BE REPORTED IMMEDIATELY:  *FEVER GREATER THAN 100.5 F  *CHILLS WITH OR WITHOUT FEVER  NAUSEA AND VOMITING THAT IS NOT CONTROLLED WITH YOUR NAUSEA MEDICATION  *UNUSUAL SHORTNESS OF BREATH  *UNUSUAL BRUISING OR BLEEDING  TENDERNESS IN MOUTH AND THROAT WITH OR WITHOUT PRESENCE OF ULCERS  *URINARY PROBLEMS  *BOWEL PROBLEMS  UNUSUAL RASH Items with * indicate a potential emergency and should be followed up as soon as possible.  Feel free to call the clinic should you have any questions or concerns. The clinic phone number is [phone].  Please show the CHEMO ALERT CARD at check-in to the Emergency Department and triage nurse.   

## 2019-01-17 NOTE — Assessment & Plan Note (Signed)
she has mild peripheral neuropathy, likely related to side effects of treatment. It is only mild, not bothering the patient. I will observe for now If it gets worse in the future, I will consider modifying the dose of the treatment  

## 2019-01-17 NOTE — Progress Notes (Signed)
[place] OFFICE PROGRESS NOTE  Patient Care Team: Burnard Bunting, MD as PCP - General (Internal Medicine)  ASSESSMENT & PLAN:  Ovarian cancer [facility]) She tolerated last cycle of treatment well except for mild peripheral neuropathy Clinically, she has positive response to treatment The weight loss seen is likely due to resolution of ascites Recent tumor marker is favorable I have plan to order CT imaging next month for objective assessment of response of therapy If she has good response to treatment, we will hold cycle 4 of treatment and she will undergo interval debulking surgery  Peripheral neuropathy due to chemotherapy [facility]) she has mild peripheral neuropathy, likely related to side effects of treatment. It is only mild, not bothering the patient. I will observe for now If it gets worse in the future, I will consider modifying the dose of the treatment   Anemia due to antineoplastic chemotherapy This is likely due to recent treatment. The patient denies recent history of bleeding such as epistaxis, hematuria or hematochezia. She is asymptomatic from the anemia. I will observe for now.    Orders Placed This Encounter  Procedures  . CT ABDOMEN PELVIS W CONTRAST    Standing Status:   Future    Standing Expiration Date:   01/17/2020    Order Specific Question:   If indicated for the ordered procedure, I authorize the administration of contrast media per Radiology protocol    Answer:   Yes    Order Specific Question:   Preferred imaging location?    Answer:   [facility]    Order Specific Question:   Radiology Contrast Protocol - do NOT remove file path    Answer:   [email or web address]    INTERVAL HISTORY: Please see below for problem oriented charting. She returns for further follow-up She tolerated last cycle of treatment well except for sensation of peripheral neuropathy in the fingers that feels numb She also had  peripheral neuropathy affecting the feet but that has resolved She have less abdominal bloating Denies nausea or constipation She is eating better  SUMMARY OF ONCOLOGIC HISTORY: Oncology History  Ovarian cancer ([place])  11/09/2018 Imaging   1. 14.7 cm poorly defined soft tissue mass in central pelvis suspicious for primary ovarian carcinoma, with uterine leiomyosarcoma considered a less likely differential diagnosis. 2. Moderate ascites and diffuse peritoneal carcinomatosis. 3. Several small uterine fibroids.   11/13/2018 Tumor Marker   Patient's tumor was tested for the following markers: CA-125 Results of the tumor marker test revealed 1434   11/21/2018 Procedure   Successful ultrasound-guided diagnostic and therapeutic paracentesis yielding 3.1 liters of peritoneal fluid   11/21/2018 Pathology Results   PERITONEAL/ASCITIC FLUID(SPECIMEN 1 OF 1 COLLECTED 11/21/18): ADENOCARCINOMA. Specimen Clinical Information Pelvic mass suspicious for ovarian cancer Source Peritoneal/Ascitic Fluid, (specimen 1 of 1 collected 11/21/18) Gross Specimen: Received is/are 1000 ccs of dark amber fluid. (CM:cm) Prepared: # Smears: 0 # Concentration Technique Slides (i.e. 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   11/29/2018 Initial Diagnosis   Ovarian cancer ([place])   11/29/2018 Cancer Staging   Staging form: Ovary, Fallopian Tube, and Primary Peritoneal Carcinoma, AJCC 8th Edition - Clinical: cT3, cN0, cM0 - Signed by [name] Lark, MD on 11/29/2018   12/03/2018 Procedure   Successful ultrasound-guided therapeutic paracentesis yielding 3.7 liters of peritoneal fluid.     12/06/2018 Procedure   Placement of a subcutaneous port device. Catheter tip at the  SVC and right atrium junction   12/14/2018 Procedure   Successful ultrasound-guided paracentesis yielding 2.9 L of peritoneal fluid   12/28/2018 Tumor Marker    Patient's tumor was tested for the following markers: CA-125 Results of the tumor marker test revealed 812.     REVIEW OF SYSTEMS:   Constitutional: Denies fevers, chills or abnormal weight loss Eyes: Denies blurriness of vision Ears, nose, mouth, throat, and face: Denies mucositis or sore throat Respiratory: Denies cough, dyspnea or wheezes Cardiovascular: Denies palpitation, chest discomfort or lower extremity swelling Gastrointestinal:  Denies nausea, heartburn or change in bowel habits Skin: Denies abnormal skin rashes Lymphatics: Denies new lymphadenopathy or easy bruising Behavioral/Psych: Mood is stable, no new changes  All other systems were reviewed with the patient and are negative.  I have reviewed the past medical history, past surgical history, social history and family history with the patient and they are unchanged from previous note.  ALLERGIES:  is allergic to shrimp [shellfish allergy] and sulfonamide derivatives.  MEDICATIONS:  Current Outpatient Medications  Medication Sig Dispense Refill  . ALPRAZolam (XANAX) 0.25 MG tablet Take 1 tablet (0.25 mg total) by mouth at bedtime as needed for anxiety. (Patient taking differently: Take 0.125 mg by mouth at bedtime as needed for anxiety. ) 15 tablet 0  . carboxymethylcellulose (REFRESH PLUS) 0.5 % SOLN Place 1 drop into both eyes 2 (two) times daily as needed (dry eyes).    . Cholecalciferol (VITAMIN D3) 25 MCG (1000 UT) CAPS Take 1,000 Units by mouth daily.     [name] Kitchen dexamethasone (DECADRON) 4 MG tablet Take 1 tablet (4 mg total) by mouth daily. 24 tablet 0  . esomeprazole (NEXIUM) 20 MG capsule Take 20 mg by mouth daily at 12 noon.    [name] Kitchen ibuprofen (ADVIL) 800 MG tablet Take 1 tablet (800 mg total) by mouth every 8 (eight) hours as needed for moderate pain. For AFTER surgery 30 tablet 0  . lidocaine-prilocaine (EMLA) cream Apply to affected area once 30 g 3  . Melatonin 5 MG SUBL Place under the tongue daily as needed.    .  Multiple Vitamin (MULTIVITAMIN) capsule Take 1 capsule by mouth daily.    . ondansetron (ZOFRAN) 8 MG tablet Take 1 tablet (8 mg total) by mouth every 8 (eight) hours as needed for refractory nausea / vomiting. 30 tablet 1  . oxyCODONE (OXY IR/ROXICODONE) 5 MG immediate release tablet Take 1 tablet (5 mg total) by mouth every 4 (four) hours as needed for severe pain. For AFTER surgery, do not take and drive 15 tablet 0  . pantoprazole (PROTONIX) 40 MG tablet Take 1 tablet (40 mg total) by mouth 2 (two) times daily before a meal. (Patient not taking: Reported on 11/16/2018) 60 tablet 1  . prochlorperazine (COMPAZINE) 10 MG tablet Take 1 tablet (10 mg total) by mouth every 6 (six) hours as needed (Nausea or vomiting). 30 tablet 1  . senna-docusate (SENOKOT-S) 8.6-50 MG tablet Take 2 tablets by mouth at bedtime. For AFTER surgery, do not take if having diarrhea 30 tablet 1  . traMADol (ULTRAM) 50 MG tablet Take 1 tablet (50 mg total) by mouth every 6 (six) hours as needed. 30 tablet 0   No current facility-administered medications for this visit.    Facility-Administered Medications Ordered in Other Visits  Medication Dose Route Frequency Provider Last Rate Last Dose  . CARBOplatin (PARAPLATIN) 600 mg in sodium chloride 0.9 % 250 mL chemo infusion  600 mg Intravenous Once Maisley Hainsworth,  MD      . heparin lock flush 100 unit/mL  500 Units Intracatheter Once PRN Alvy Bimler, Huntington Leverich, MD      . PACLitaxel (TAXOL) 300 mg in sodium chloride 0.9 % 250 mL chemo infusion (> 80mg /m2)  175 mg/m2 (Treatment Plan Recorded) Intravenous Once Alvy Bimler, Ysabel Stankovich, MD 100 mL/hr at 01/17/19 1238 300 mg at 01/17/19 1238  . sodium chloride flush (NS) 0.9 % injection 10 mL  10 mL Intracatheter PRN Alvy Bimler, Yahia Bottger, MD        PHYSICAL EXAMINATION: ECOG PERFORMANCE STATUS: 1 - Symptomatic but completely ambulatory  Vitals:   01/17/19 0957  BP: (!) 141/77  Pulse: 94  Resp: 20  Temp: 98.2 F (36.8 C)   Filed Weights   01/17/19 0957   Weight: 134 lb 9.6 oz (61.1 kg)    GENERAL:alert, no distress and comfortable SKIN: skin color, texture, turgor are normal, no rashes or significant lesions EYES: normal, Conjunctiva are pink and non-injected, sclera clear OROPHARYNX:no exudate, no erythema and lips, buccal mucosa, and tongue normal  NECK: supple, thyroid normal size, non-tender, without nodularity LYMPH:  no palpable lymphadenopathy in the cervical, axillary or inguinal LUNGS: clear to auscultation and percussion with normal breathing effort HEART: regular rate & rhythm and no murmurs and no lower extremity edema ABDOMEN:abdomen soft, I am not able to appreciate any residual ascites Musculoskeletal:no cyanosis of digits and no clubbing  NEURO: alert & oriented x 3 with fluent speech, no focal motor/sensory deficits  LABORATORY DATA:  I have reviewed the data as listed    Component Value Date/Time   NA 141 01/17/2019 0950   K 3.7 01/17/2019 0950   CL 108 01/17/2019 0950   CO2 23 01/17/2019 0950   GLUCOSE 188 (H) 01/17/2019 0950   BUN 12 01/17/2019 0950   CREATININE 0.65 01/17/2019 0950   CALCIUM 9.3 01/17/2019 0950   PROT 6.9 01/17/2019 0950   ALBUMIN 3.8 01/17/2019 0950   AST 37 01/17/2019 0950   ALT 26 01/17/2019 0950   ALKPHOS 157 (H) 01/17/2019 0950   BILITOT 0.6 01/17/2019 0950   GFRNONAA >60 01/17/2019 0950   GFRAA >60 01/17/2019 0950    No results found for: SPEP, UPEP  Lab Results  Component Value Date   WBC 5.3 01/17/2019   NEUTROABS 4.5 01/17/2019   HGB 11.2 (L) 01/17/2019   HCT 35.1 (L) 01/17/2019   MCV 86.0 01/17/2019   PLT 268 01/17/2019      Chemistry      Component Value Date/Time   NA 141 01/17/2019 0950   K 3.7 01/17/2019 0950   CL 108 01/17/2019 0950   CO2 23 01/17/2019 0950   BUN 12 01/17/2019 0950   CREATININE 0.65 01/17/2019 0950      Component Value Date/Time   CALCIUM 9.3 01/17/2019 0950   ALKPHOS 157 (H) 01/17/2019 0950   AST 37 01/17/2019 0950   ALT 26  01/17/2019 0950   BILITOT 0.6 01/17/2019 0950      All questions were answered. The patient knows to call the clinic with any problems, questions or concerns. No barriers to learning was detected.  I spent 15 minutes counseling the patient face to face. The total time spent in the appointment was 20 minutes and more than 50% was on counseling and review of test results  [name] Lark, MD 01/17/2019 12:45 PM

## 2019-01-25 ENCOUNTER — Telehealth: Payer: Self-pay | Admitting: *Deleted

## 2019-01-25 NOTE — Telephone Encounter (Signed)
Called and scheduled the patient for a lab appt on 9/11 pr Melissa APP

## 2019-01-30 ENCOUNTER — Other Ambulatory Visit: Payer: Self-pay | Admitting: Gynecologic Oncology

## 2019-01-30 DIAGNOSIS — C569 Malignant neoplasm of unspecified ovary: Secondary | ICD-10-CM

## 2019-01-30 DIAGNOSIS — R1900 Intra-abdominal and pelvic swelling, mass and lump, unspecified site: Secondary | ICD-10-CM

## 2019-02-01 ENCOUNTER — Telehealth: Payer: Self-pay | Admitting: *Deleted

## 2019-02-01 ENCOUNTER — Inpatient Hospital Stay: Payer: Managed Care, Other (non HMO) | Attending: Gynecologic Oncology

## 2019-02-01 ENCOUNTER — Other Ambulatory Visit: Payer: Self-pay

## 2019-02-01 ENCOUNTER — Ambulatory Visit (HOSPITAL_COMMUNITY)
Admission: RE | Admit: 2019-02-01 | Discharge: 2019-02-01 | Disposition: A | Discharge status: Home / Self Care | Payer: Managed Care, Other (non HMO) | Source: Ambulatory Visit | Attending: Hematology and Oncology | Admitting: Hematology and Oncology

## 2019-02-01 DIAGNOSIS — E785 Hyperlipidemia, unspecified: Secondary | ICD-10-CM | POA: Insufficient documentation

## 2019-02-01 DIAGNOSIS — Z90710 Acquired absence of both cervix and uterus: Secondary | ICD-10-CM | POA: Insufficient documentation

## 2019-02-01 DIAGNOSIS — K589 Irritable bowel syndrome without diarrhea: Secondary | ICD-10-CM | POA: Insufficient documentation

## 2019-02-01 DIAGNOSIS — R971 Elevated cancer antigen 125 [CA 125]: Secondary | ICD-10-CM | POA: Insufficient documentation

## 2019-02-01 DIAGNOSIS — Z90722 Acquired absence of ovaries, bilateral: Secondary | ICD-10-CM | POA: Insufficient documentation

## 2019-02-01 DIAGNOSIS — K449 Diaphragmatic hernia without obstruction or gangrene: Secondary | ICD-10-CM | POA: Insufficient documentation

## 2019-02-01 DIAGNOSIS — D259 Leiomyoma of uterus, unspecified: Secondary | ICD-10-CM | POA: Insufficient documentation

## 2019-02-01 DIAGNOSIS — F419 Anxiety disorder, unspecified: Secondary | ICD-10-CM | POA: Insufficient documentation

## 2019-02-01 DIAGNOSIS — K5790 Diverticulosis of intestine, part unspecified, without perforation or abscess without bleeding: Secondary | ICD-10-CM | POA: Insufficient documentation

## 2019-02-01 DIAGNOSIS — C569 Malignant neoplasm of unspecified ovary: Secondary | ICD-10-CM | POA: Insufficient documentation

## 2019-02-01 DIAGNOSIS — Z87891 Personal history of nicotine dependence: Secondary | ICD-10-CM | POA: Insufficient documentation

## 2019-02-01 DIAGNOSIS — C786 Secondary malignant neoplasm of retroperitoneum and peritoneum: Secondary | ICD-10-CM | POA: Insufficient documentation

## 2019-02-01 DIAGNOSIS — K219 Gastro-esophageal reflux disease without esophagitis: Secondary | ICD-10-CM | POA: Insufficient documentation

## 2019-02-01 DIAGNOSIS — Z79899 Other long term (current) drug therapy: Secondary | ICD-10-CM | POA: Insufficient documentation

## 2019-02-01 DIAGNOSIS — Z9221 Personal history of antineoplastic chemotherapy: Secondary | ICD-10-CM | POA: Insufficient documentation

## 2019-02-01 LAB — CMP (CANCER CENTER ONLY)
ALT: 56 U/L — ABNORMAL HIGH (ref 0–44)
AST: 63 U/L — ABNORMAL HIGH (ref 15–41)
Albumin: 3.8 g/dL (ref 3.5–5.0)
Alkaline Phosphatase: 208 U/L — ABNORMAL HIGH (ref 38–126)
Anion gap: 7 (ref 5–15)
BUN: 10 mg/dL (ref 8–23)
CO2: 28 mmol/L (ref 22–32)
Calcium: 9 mg/dL (ref 8.9–10.3)
Chloride: 106 mmol/L (ref 98–111)
Creatinine: 0.59 mg/dL (ref 0.44–1.00)
GFR, Est AFR Am: >60 mL/min (ref >60)
GFR, Estimated: >60 mL/min (ref >60)
Glucose, Bld: 91 mg/dL (ref 70–99)
Potassium: 4 mmol/L (ref 3.5–5.1)
Sodium: 141 mmol/L (ref 135–145)
Total Bilirubin: 0.6 mg/dL (ref 0.3–1.2)
Total Protein: 6.4 g/dL — ABNORMAL LOW (ref 6.5–8.1)

## 2019-02-01 LAB — CBC WITH DIFFERENTIAL (CANCER CENTER ONLY)
Abs Immature Granulocytes: 0.01 10*3/uL (ref 0.00–0.07)
Basophils Absolute: 0 10*3/uL (ref 0.0–0.1)
Basophils Relative: 1 %
Eosinophils Absolute: 0.1 10*3/uL (ref 0.0–0.5)
Eosinophils Relative: 2 %
HCT: 32.2 % — ABNORMAL LOW (ref 36.0–46.0)
Hemoglobin: 10.3 g/dL — ABNORMAL LOW (ref 12.0–15.0)
Immature Granulocytes: 0 %
Lymphocytes Relative: 63 %
Lymphs Abs: 1.5 10*3/uL (ref 0.7–4.0)
MCH: 28.5 pg (ref 26.0–34.0)
MCHC: 32 g/dL (ref 30.0–36.0)
MCV: 89.2 fL (ref 80.0–100.0)
Monocytes Absolute: 0.5 10*3/uL (ref 0.1–1.0)
Monocytes Relative: 19 %
Neutro Abs: 0.4 10*3/uL — CL (ref 1.7–7.7)
Neutrophils Relative %: 15 %
Platelet Count: 203 10*3/uL (ref 150–400)
RBC: 3.61 MIL/uL — ABNORMAL LOW (ref 3.87–5.11)
RDW: 20.6 % — ABNORMAL HIGH (ref 11.5–15.5)
WBC Count: 2.4 10*3/uL — ABNORMAL LOW (ref 4.0–10.5)
nRBC: 0 % (ref 0.0–0.2)

## 2019-02-01 MED ORDER — SODIUM CHLORIDE (PF) 0.9 % IJ SOLN
INTRAMUSCULAR | Status: AC
  Filled 2019-02-01: qty 50

## 2019-02-01 MED ORDER — IOHEXOL 300 MG/ML  SOLN
100.0000 mL | Freq: Once | INTRAMUSCULAR | Status: AC | PRN
  Administered 2019-02-01: 100 mL via INTRAVENOUS

## 2019-02-01 NOTE — Telephone Encounter (Signed)
CRITICAL VALUE STICKER  CRITICAL VALUE: ANC = 0.4  RECEIVER (on-site recipient of call): Cherylynn Ridges RN, Triage [place] NOTIFIED: 02/01/2019 at 1044.   MESSENGER (representative from lab): Rhonda MT Myles Gip  MD NOTIFIED: Collaborative for Dr. Alvy Bimler.  TIME OF NOTIFICATION: 02/01/2019 at 1054.  RESPONSE: None

## 2019-02-02 LAB — CA 125: Cancer Antigen (CA) 125: 183 U/mL — ABNORMAL HIGH (ref 0.0–38.1)

## 2019-02-04 ENCOUNTER — Other Ambulatory Visit: Payer: Self-pay | Admitting: Gynecologic Oncology

## 2019-02-04 ENCOUNTER — Encounter: Payer: Self-pay | Admitting: Gynecologic Oncology

## 2019-02-04 ENCOUNTER — Inpatient Hospital Stay (HOSPITAL_BASED_OUTPATIENT_CLINIC_OR_DEPARTMENT_OTHER): Payer: Managed Care, Other (non HMO) | Admitting: Gynecologic Oncology

## 2019-02-04 ENCOUNTER — Ambulatory Visit: Payer: Managed Care, Other (non HMO) | Admitting: Gynecologic Oncology

## 2019-02-04 ENCOUNTER — Other Ambulatory Visit: Payer: Self-pay

## 2019-02-04 VITALS — BP 137/75 | HR 83 | Temp 98.5°F | Resp 18

## 2019-02-04 DIAGNOSIS — Z87891 Personal history of nicotine dependence: Secondary | ICD-10-CM | POA: Diagnosis not present

## 2019-02-04 DIAGNOSIS — C569 Malignant neoplasm of unspecified ovary: Secondary | ICD-10-CM

## 2019-02-04 DIAGNOSIS — T451X5A Adverse effect of antineoplastic and immunosuppressive drugs, initial encounter: Secondary | ICD-10-CM

## 2019-02-04 DIAGNOSIS — C786 Secondary malignant neoplasm of retroperitoneum and peritoneum: Secondary | ICD-10-CM

## 2019-02-04 DIAGNOSIS — K219 Gastro-esophageal reflux disease without esophagitis: Secondary | ICD-10-CM | POA: Diagnosis not present

## 2019-02-04 DIAGNOSIS — R971 Elevated cancer antigen 125 [CA 125]: Secondary | ICD-10-CM | POA: Diagnosis not present

## 2019-02-04 DIAGNOSIS — Z9221 Personal history of antineoplastic chemotherapy: Secondary | ICD-10-CM

## 2019-02-04 DIAGNOSIS — K449 Diaphragmatic hernia without obstruction or gangrene: Secondary | ICD-10-CM | POA: Diagnosis not present

## 2019-02-04 DIAGNOSIS — D259 Leiomyoma of uterus, unspecified: Secondary | ICD-10-CM | POA: Diagnosis not present

## 2019-02-04 DIAGNOSIS — K589 Irritable bowel syndrome without diarrhea: Secondary | ICD-10-CM | POA: Diagnosis not present

## 2019-02-04 DIAGNOSIS — R180 Malignant ascites: Secondary | ICD-10-CM

## 2019-02-04 DIAGNOSIS — K5790 Diverticulosis of intestine, part unspecified, without perforation or abscess without bleeding: Secondary | ICD-10-CM | POA: Diagnosis not present

## 2019-02-04 DIAGNOSIS — E785 Hyperlipidemia, unspecified: Secondary | ICD-10-CM | POA: Diagnosis not present

## 2019-02-04 DIAGNOSIS — F419 Anxiety disorder, unspecified: Secondary | ICD-10-CM | POA: Diagnosis not present

## 2019-02-04 DIAGNOSIS — D701 Agranulocytosis secondary to cancer chemotherapy: Secondary | ICD-10-CM

## 2019-02-04 DIAGNOSIS — Z79899 Other long term (current) drug therapy: Secondary | ICD-10-CM | POA: Diagnosis not present

## 2019-02-04 MED ORDER — ERYTHROMYCIN BASE 500 MG PO TABS: ORAL_TABLET | ORAL | 0 refills | Status: DC

## 2019-02-04 MED ORDER — NEOMYCIN SULFATE 500 MG PO TABS: ORAL_TABLET | ORAL | 0 refills | Status: DC

## 2019-02-04 MED FILL — NEOMYCIN 500 MG TABLET: 500 | 1 days supply | Qty: 6 | Fill #0

## 2019-02-04 MED FILL — ERYTHROMYCIN BASE 500 MG TA: 500 | 1 days supply | Qty: 6 | Fill #0

## 2019-02-04 NOTE — Patient Instructions (Addendum)
Preparing for your Surgery  Plan for surgery on February 12, 2019 with Dr. Everitt Amber at [place] will be scheduled for a exploratory laparotomy, total abdominal hysterectomy, bilateral salpingo-oophorectomy, omentectomy, radical tumor debulking.   Pre-operative Testing -You will receive a phone call from presurgical testing at [facility] if you have not received a call already to arrange for a pre-operative testing appointment before your surgery.  This appointment normally occurs one to two weeks before your scheduled surgery.   -Bring your insurance card, copy of an advanced directive if applicable, medication list  -At that visit, you will be asked to sign a consent for a possible blood transfusion in case a transfusion becomes necessary during surgery.  The need for a blood transfusion is rare but having consent is a necessary part of your care.     -You should not be taking blood thinners or aspirin at least ten days prior to surgery unless instructed by your surgeon.  -As part of our enhanced surgical recovery pathway, you may be advised to drink a carbohydrate drink the evening before and the morning of surgery (at least 3 hours before). If you are diabetic, this will be substituted with G2 gatorade in order to prevent elevated glucose levels prior to surgery.  -Do not take supplements such as fish oil (omega 3), red yeast rice, tumeric before your surgery.  Day Before Surgery at Home -See below for specific instructions for your bowel prep.  If your bowels are filled with gas, your surgeon will have difficulty visualizing your pelvic organs which increases your surgical risks.  Your role in recovery Your role is to become active as soon as directed by your doctor, while still giving yourself time to heal.  Rest when you feel tired. You will be asked to do the following in order to speed your recovery:  - Cough and breathe deeply. This helps toclear and  expand your lungs and can prevent pneumonia. You may be given a spirometer to practice deep breathing. A staff member will show you how to use the spirometer. - Do mild physical activity. Walking or moving your legs help your circulation and body functions return to normal. A staff member will help you when you try to walk and will provide you with simple exercises. Do not try to get up or walk alone the first time. - Actively manage your pain. Managing your pain lets you move in comfort. We will ask you to rate your pain on a scale of zero to 10. It is your responsibility to tell your doctor or nurse where and how much you hurt so your pain can be treated.  Special Considerations -If you are diabetic, you may be placed on insulin after surgery to have closer control over your blood sugars to promote healing and recovery.  This does not mean that you will be discharged on insulin.  If applicable, your oral antidiabetics will be resumed when you are tolerating a solid diet.  -Your final pathology results from surgery should be available around one week after surgery and the results will be relayed to you when available.  -Dr. Lahoma Crocker is the surgeon that assists your GYN Oncologist with surgery.  If you end up staying the night, the next day after your surgery you will either see Dr. Denman George or Dr. Lahoma Crocker.  -FMLA forms can be faxed to [phone] and please allow 5-7 business days for completion.  Pain Management After Surgery -You  have been prescribed your pain medication and bowel regimen medications before surgery so that you can have these available when you are discharged from the hospital. The pain medication is for use ONLY AFTER surgery and a new prescription will not be given.   -Make sure that you have Tylenol and Ibuprofen at home to use on a regular basis after surgery for pain control. We recommend alternating the medications every hour to six hours since they work  differently and are processed in the body differently for pain relief.  -Review the attached handout on narcotic use and their risks and side effects.   Bowel Regimen -You have been prescribed Sennakot-S to take nightly to prevent constipation especially if you are taking the narcotic pain medication intermittently.  It is important to prevent constipation and drink adequate amounts of liquids.   Blood Transfusion Information WHAT IS A BLOOD TRANSFUSION? A transfusion is the replacement of blood or some of its parts. Blood is made up of multiple cells which provide different functions.  Red blood cells carry oxygen and are used for blood loss replacement.  White blood cells fight against infection.  Platelets control bleeding.  Plasma helps clot blood.  Other blood products are available for specialized needs, such as hemophilia or other clotting disorders. BEFORE THE TRANSFUSION  Who gives blood for transfusions?   You may be able to donate blood to be used at a later date on yourself (autologous donation).  Relatives can be asked to donate blood. This is generally not any safer than if you have received blood from a stranger. The same precautions are taken to ensure safety when a relative's blood is donated.  Healthy volunteers who are fully evaluated to make sure their blood is safe. This is blood bank blood. Transfusion therapy is the safest it has ever been in the practice of medicine. Before blood is taken from a donor, a complete history is taken to make sure that person has no history of diseases nor engages in risky social behavior (examples are intravenous drug use or sexual activity with multiple partners). The donor's travel history is screened to minimize risk of transmitting infections, such as malaria. The donated blood is tested for signs of infectious diseases, such as HIV and hepatitis. The blood is then tested to be sure it is compatible with you in order to minimize  the chance of a transfusion reaction. If you or a relative donates blood, this is often done in anticipation of surgery and is not appropriate for emergency situations. It takes many days to process the donated blood. RISKS AND COMPLICATIONS Although transfusion therapy is very safe and saves many lives, the main dangers of transfusion include:   Getting an infectious disease.  Developing a transfusion reaction. This is an allergic reaction to something in the blood you were given. Every precaution is taken to prevent this. The decision to have a blood transfusion has been considered carefully by your caregiver before blood is given. Blood is not given unless the benefits outweigh the risks.  AFTER SURGERY INSTRUCTIONS  02/04/2019  Return to work: 4-6 weeks if applicable  Activity: 1. Be up and out of the bed during the day.  Take a nap if needed.  You may walk up steps but be careful and use the hand rail.  Stair climbing will tire you more than you think, you may need to stop part way and rest.   2. No lifting or straining for 6 weeks.  3.  No driving for 2 week(s).  Do not drive if you are taking narcotic pain medicine.  4. Shower daily.  Use soap and water on your incision and pat dry; don't rub.  No tub baths until cleared by your surgeon.   5. No sexual activity and nothing in the vagina for 6 weeks.  6. You may experience a small amount of clear drainage from your incisions, which is normal.  If the drainage persists or increases, please call the office.  7. You may experience vaginal spotting after surgery or around the 6-8 week mark from surgery when the stitches at the top of the vagina begin to dissolve.  The spotting is normal but if you experience heavy bleeding, call our office.  8. Take Tylenol or ibuprofen first for pain and only use narcotic pain meds for severe pain not relieved by the Tylenol or Ibuprofen.  Monitor your Tylenol intake to a max of 4,000 mg a day.   Diet: 1. Low sodium Heart Healthy Diet is recommended.  2. It is safe to use a laxative, such as Miralax or Colace, if you have difficulty moving your bowels. You can take Sennakot at bedtime every evening to keep bowel movements regular and to prevent constipation.    Wound Care: 1. Keep clean and dry.  Shower daily.  Reasons to call the Doctor:  Fever - Oral temperature greater than 100.4 degrees Fahrenheit  Foul-smelling vaginal discharge  Difficulty urinating  Nausea and vomiting  Increased pain at the site of the incision that is unrelieved with pain medicine.  Difficulty breathing with or without chest pain  New calf pain especially if only on one side  Sudden, continuing increased vaginal bleeding with or without clots.   Contacts: For questions or concerns you should contact:  Dr. Everitt Amber at [phone]  Joylene John, NP at [phone]  After Hours: call [phone] and have the GYN Oncologist paged/contacted

## 2019-02-04 NOTE — H&P (View-Only) (Signed)
Follow-up Note: Gyn-Onc  Arnetha Courser 63 y.o. female  CC:  Chief Complaint  Patient presents with  . 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  Based on labs, physical exam and CT imaging, I feel that she is a good candidate for interval cytoreduction at this time with high probability for complete cytoreduction. I explained that even when there is no gross residual disease remaining after surgery, there is still microscopic residual disease which requires additional chemotherapy doses to control. Therefore 3 cycles (minimum) of adjuvant carb/tax will follow, and, if genetics testing reveals homologous recombination deficiency (germline or somatic) then I would recommend PARP inhibitor maintenance.  I extensively counseling Ms Mclaurin regarding what to expect from surgery with respect to laparotomy, possible bowel resection and risk including  bleeding, infection, damage to internal organs (such as bladder,ureters, bowels), blood clot, reoperation and rehospitalization. I explained the risk of bowel leak, stricture and colostomy.  I explained the risk of delayed resumption of chemotherapy should this develop.  She is at particularly high risk for postop VTE, and therefore will be on lovenox x 1 month postop.   HPI:  The patient was seen originally in consultation at the request of Dr. Hilarie Fredrickson. PCP: Dr. Reynaldo Minium for pelvic mass and ascites.  Patient is a very pleasant G1P0 who took some antibiotics in 11/19 for a tooth issue and did not feel "right" after that. She then began having worse reflux and bloating in 3/20. She states that due to [place], she decided to "stick it out". She then communicated with Dr. Hilarie Fredrickson and a CT scan was ordered 11/09/18:  -Hepatobiliary: No hepatic masses identified. Gallbladder is  unremarkable. -Adrenals/Urinary Tract: No masses identified. Several small less than 1 cm left renal calculi are identified. No evidence of ureteralcalculi or hydronephrosis. -Stomach/Bowel: Small hiatal hernia noted. No evidence of obstruction, inflammatory process or abnormal fluid collections.  -Vascular/Lymphatic: No pathologically enlarged lymph nodes. No abdominal aortic aneurysm.  -Reproductive: Retroflexed uterus is seen containing multiple small fibroids, largest in the anterior lower uterine segment measuring 5.5 cm and showing cystic degeneration.  -A large poorly defined soft tissue mass is seen in the central pelvis which is contiguous with the anterior wall of the retroflexed uterus. This measures 14.7 x 9.5 by 11.3 cm, and is suspicious for ovarian carcinoma, with uterine leiomyosarcoma considered a less likely differential diagnosis.  -Other: Moderate ascites is seen. Omental soft tissue caking and multiple peritoneal nodules are present in the abdomen and pelvis, consistent with diffuse peritoneal carcinomatosis. IMPRESSION: 1. 14.7 cm poorly defined soft tissue mass in central pelvis suspicious for primary ovarian carcinoma, with uterine leiomyosarcoma considered a less likely differential diagnosis. 2. Moderate ascites and diffuse peritoneal carcinomatosis. 3. Several small uterine fibroids.  She states that in retrospect, her symptoms of reflux have been getting worse but she thought that it was due to her antibiotics. She was also having increased diarrhea, lower abdominal pressure and fullness, increased gas and early satiety. She though that her hiatal hernia may also be worse. She states that she was always on the look out for bleeding as she is PMP, but was not aware of these symptoms. She went through MP around [age] but never took any HRT. She took OCPs for about 10 years cumulatively in college.  Health maintenance is up-to-date and she is not due for repeat  colonoscopy  until 2024.  She has never had an abnormal Pap smear.  Interval Hx:  At the time of diagnosis CA 125 was very elevated to 1434 on 11/13/18. Her Albumin was <3.5 and her large volume ascites required multiple paracenteses to control.   A tentative surgical date of June 25 have been set however due to scheduling issues this needed to be postponed.  Additionally given the multiple markers of low probability for complete cytoreduction (large volume ascites, primary tumor greater than 10 cm, Ca1 25 greater than 500, serum albumin less than 3.5, it was determined that a primary cytoreductive effort would likely be futile and subjected to high risk of complications.  Therefore, because neoadjuvant chemotherapy is associated with the same long-term survival from ovarian cancer but improved perioperative outcomes, a decision was made with the patient to proceed with tissue biopsy followed by neoadjuvant chemotherapy with carboplatin paclitaxel.  Cytology from her paracenteses confirmed adenocarcinoma favor GYN primary (this was performed on November 21, 2018).  She commenced her first of 3 cycles of neoadjuvant carboplatin paclitaxel on December 06, 2018.  CA 125 on 02/01/19 showed a reduction to 187.  Cycle 3 was administered on January 17, 2019.  Repeat CT scan of the abdomen and pelvis was performed on February 01, 2019.  It revealed the left complex ovarian mass now measured 8.6 x 5.8 x 7.7 cm decreased from 14.7 x 9.5 x 11.3 cm.  There was a small volume of residual abdominal ascites tracking beside the liver and spleen.  The peritoneal nodularity omental caking is improved but persistent with a dominant implant beneath the right mid anterior abdominal wall measuring 1 cm.  CMP Latest Ref Rng & Units 02/01/2019 01/17/2019 12/28/2018  Glucose 70 - 99 mg/dL 91 188(H) 165(H)  BUN 8 - 23 mg/dL _0 Creatinine 0.44 - 1.00 mg/dL 0.59 0.65 0.57  Sodium 135 - 145 mmol/L 141 141 141  Potassium 3.5 - 5.1  mmol/L 4.0 3.7 3.8  Chloride 98 - 111 mmol/L 106 108 108  CO2 22 - 32 mmol/L 28 23 21(L)  Calcium 8.9 - 10.3 mg/dL 9.0 9.3 9.3  Total Protein 6.5 - 8.1 g/dL 6.4(L) 6.9 6.5  Total Bilirubin 0.3 - 1.2 mg/dL 0.6 0.6 0.3  Alkaline Phos 38 - 126 U/L 208(H) 157(H) 158(H)  AST 15 - 41 U/L 63(H) 37 30  ALT 0 - 44 U/L 56(H) 26 15   CBC    Component Value Date/Time   WBC 2.4 (L) 02/01/2019 0943   WBC 7.9 12/06/2018 0855   RBC 3.61 (L) 02/01/2019 0943   HGB 10.3 (L) 02/01/2019 0943   HCT 32.2 (L) 02/01/2019 0943   PLT 203 02/01/2019 0943   MCV 89.2 02/01/2019 0943   MCH 28.5 02/01/2019 0943   MCHC 32.0 02/01/2019 0943   RDW 20.6 (H) 02/01/2019 0943   LYMPHSABS 1.5 02/01/2019 0943   MONOABS 0.5 02/01/2019 0943   EOSABS 0.1 02/01/2019 0943   BASOSABS 0.0 02/01/2019 0943    Review of Systems: Constitutional:Denies fever. + weight loss due to nerves. Also trying to walk about 2-3 miles per day Skin: No rash Cardiovascular: No chest pain, shortness of breath, or edema  Pulmonary: No cough  Gastro Intestinal: Reporting intermittent lower abdominal soreness.  No nausea, vomiting, constipation, + diarrhea reported. + early satiety Genitourinary: + pelvic pressure. Denies vaginal bleeding and discharge.  Musculoskeletal: Mild LBP   Psychology: Very nervous due to this diagnosis. She has xanax at home and has  been taking .125 prn  Current Meds:  Outpatient Encounter Medications as of 02/04/2019  Medication Sig  . ALPRAZolam (XANAX) 0.25 MG tablet Take 1 tablet (0.25 mg total) by mouth at bedtime as needed for anxiety. (Patient taking differently: Take 0.125 mg by mouth at bedtime as needed for anxiety. )  . carboxymethylcellulose (REFRESH PLUS) 0.5 % SOLN Place 1 drop into both eyes 2 (two) times daily as needed (dry eyes).  . Cholecalciferol (VITAMIN D3) 25 MCG (1000 UT) CAPS Take 1,000 Units by mouth daily.   [name] Kitchen dexamethasone (DECADRON) 4 MG tablet Take 1 tablet (4 mg total) by mouth daily.   [name] Kitchen esomeprazole (NEXIUM) 20 MG capsule Take 20 mg by mouth daily at 12 noon.  . lidocaine-prilocaine (EMLA) cream Apply to affected area once  . Multiple Vitamin (MULTIVITAMIN) capsule Take 1 capsule by mouth daily.  . ondansetron (ZOFRAN) 8 MG tablet Take 1 tablet (8 mg total) by mouth every 8 (eight) hours as needed for refractory nausea / vomiting.  . prochlorperazine (COMPAZINE) 10 MG tablet Take 1 tablet (10 mg total) by mouth every 6 (six) hours as needed (Nausea or vomiting).  [name] Kitchen senna-docusate (SENOKOT-S) 8.6-50 MG tablet Take 2 tablets by mouth at bedtime. For AFTER surgery, do not take if having diarrhea  . traMADol (ULTRAM) 50 MG tablet Take 1 tablet (50 mg total) by mouth every 6 (six) hours as needed.  [name] Kitchen ibuprofen (ADVIL) 800 MG tablet Take 1 tablet (800 mg total) by mouth every 8 (eight) hours as needed for moderate pain. For AFTER surgery (Patient not taking: Reported on 02/04/2019)  . Melatonin 5 MG SUBL Place under the tongue daily as needed.  [name] Kitchen oxyCODONE (OXY IR/ROXICODONE) 5 MG immediate release tablet Take 1 tablet (5 mg total) by mouth every 4 (four) hours as needed for severe pain. For AFTER surgery, do not take and drive (Patient not taking: Reported on 02/04/2019)  . [DISCONTINUED] pantoprazole (PROTONIX) 40 MG tablet Take 1 tablet (40 mg total) by mouth 2 (two) times daily before a meal. (Patient not taking: Reported on 11/16/2018)   No facility-administered encounter medications on file as of 02/04/2019.     Allergy:  Allergies  Allergen Reactions  . Shrimp [Shellfish Allergy] Itching  . Sulfonamide Derivatives Itching and Nausea Only    Social Hx:   Social History   Socioeconomic History  . Marital status: Married    Spouse name: Not on file  . Number of children: 0  . Years of education: Not on file  . Highest education level: Not on file  Occupational History  . Occupation: Geographical information systems officer  . Financial resource strain: Not on file  . Food insecurity     Worry: Not on file    Inability: Not on file  . Transportation needs    Medical: Not on file    Non-medical: Not on file  Tobacco Use  . Smoking status: Former Smoker    Years: 10.00    Types: Cigarettes    Quit date: 05/23/1980    Years since quitting: 38.7  . Smokeless tobacco: Never Used  . Tobacco comment: quit 1984  Substance and Sexual Activity  . Alcohol use: Yes    Alcohol/week: 0.0 standard drinks    Comment: 2/day, 11-21-2018   no alcodholint he last month   . Drug use: No  . Sexual activity: Yes    Partners: Male  Lifestyle  . Physical activity    Days per week: Not on file  Minutes per session: Not on file  . Stress: Not on file  Relationships  . Social Herbalist on phone: Not on file    Gets together: Not on file    Attends religious service: Not on file    Active member of club or organization: Not on file    Attends meetings of clubs or organizations: Not on file    Relationship status: Not on file  . Intimate partner violence    Fear of current or ex partner: Not on file    Emotionally abused: Not on file    Physically abused: Not on file    Forced sexual activity: Not on file  Other Topics Concern  . Not on file  Social History Narrative  . Not on file    Past Surgical Hx:  Past Surgical History:  Procedure Laterality Date  . CATARACT EXTRACTION, BILATERAL    . cystoscopy with instillation for cystitis  2001  . DILATION AND CURETTAGE OF UTERUS     miscarriage  . IR IMAGING GUIDED PORT INSERTION  12/06/2018  . IR PARACENTESIS  12/03/2018  . IR PARACENTESIS  12/14/2018  . LAPAROSCOPY     adhesions  . LITHOTRIPSY  07/2013   x 2  . MYOMECTOMY  1992  . PARACENTESIS  11/21/2018   abdominal , removed 3.1 Liters     Past Medical Hx:  Past Medical History:  Diagnosis Date  . Anxiety state, unspecified   . Diverticulosis   . Esophageal reflux   . Fibroid tumor   . Headache    due to allergies   . Hiatal hernia   . Hyperlipidemia    . IBS (irritable bowel syndrome)   . Internal hemorrhoids   . Nonspecific elevation of levels of transaminase or lactic acid dehydrogenase (LDH)   . Renal stone 07/2013  . Tubulovillous adenoma of colon   . Varicose veins    superficail thrombophlebitis (left LE)    Oncology Hx:  Oncology History  Ovarian cancer ([place])  11/09/2018 Imaging   1. 14.7 cm poorly defined soft tissue mass in central pelvis suspicious for primary ovarian carcinoma, with uterine leiomyosarcoma considered a less likely differential diagnosis. 2. Moderate ascites and diffuse peritoneal carcinomatosis. 3. Several small uterine fibroids.   11/13/2018 Tumor Marker   Patient's tumor was tested for the following markers: CA-125 Results of the tumor marker test revealed 1434   11/21/2018 Procedure   Successful ultrasound-guided diagnostic and therapeutic paracentesis yielding 3.1 liters of peritoneal fluid   11/21/2018 Pathology Results   PERITONEAL/ASCITIC FLUID(SPECIMEN 1 OF 1 COLLECTED 11/21/18): ADENOCARCINOMA. Specimen Clinical Information Pelvic mass suspicious for ovarian cancer Source Peritoneal/Ascitic Fluid, (specimen 1 of 1 collected 11/21/18) Gross Specimen: Received is/are 1000 ccs of dark amber fluid. (CM:cm) Prepared: # Smears: 0 # Concentration Technique Slides (i.e. 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   11/29/2018 Initial Diagnosis   Ovarian cancer ([place])   11/29/2018 Cancer Staging   Staging form: Ovary, Fallopian Tube, and Primary Peritoneal Carcinoma, AJCC 8th Edition - Clinical: cT3, cN0, cM0 - Signed by [name] Lark, MD on 11/29/2018   12/03/2018 Procedure   Successful ultrasound-guided therapeutic paracentesis yielding 3.7 liters of peritoneal fluid.     12/06/2018 Procedure   Placement of a subcutaneous port device. Catheter tip at the SVC and right atrium junction   12/14/2018 Procedure    Successful ultrasound-guided paracentesis yielding 2.9 L of  peritoneal fluid   12/28/2018 Tumor Marker   Patient's tumor was tested for the following markers: CA-125 Results of the tumor marker test revealed 812.   02/01/2019 Imaging   Ct abdomen and pelvis 8.6 cm left ovarian mass, corresponding to the patient's known primary neoplasm, improved.   Mild peritoneal nodularity/omental caking, improved.   Small abdominopelvic ascites, improved.     02/01/2019 Tumor Marker   Patient's tumor was tested for the following markers: CA-125 Results of the tumor marker test revealed 183.     Family Hx:  Family History  Problem Relation Age of Onset  . Lymphoma Father   . Heart disease Father   . Hypertension Father   . Hypertension Mother   . Hyperlipidemia Mother   . Breast cancer Mother 92  . Diabetes Maternal Grandmother   . Uterine cancer Maternal Grandmother   . Stomach cancer Paternal Grandfather   . Colon cancer Neg Hx     Vitals:  Blood pressure 137/75, pulse 83, temperature 98.5 F (36.9 C), temperature source Temporal, resp. rate 18, SpO2 100 %.  Physical Exam: Well-nourished well-developed female in no acute distress.  Neck: Supple, no lymphadenopathy, no thyromegaly.  Abdomen: Well-healed laparoscopic incision.  Palpable fluid wave.  Abdomen is distended.  There is no rebound or guarding.  There is a fullness felt best towards the left side just below the umbilicus.  Groins: No lymphadenopathy.  Extremities: No edema.  Pelvic: External genitalia within normal limits.  The vagina is atrophic.  The cervix is deviated anteriorly.  It is nulliparous.  There is no visible lesions.  There is a physiologic discharge.  Bimanual examination reveals a cervix to be deviated anteriorly.  The cervix is palpably normal.  I cannot separate the uterus from the large abdominal pelvic mass measuring approximately 8 cm.  It appears to be leaning more towards the patient's left side.  It  is now freely mobile.  On rectovaginal examination there is no obviously nodularity felt and the rectal mucosa is smooth.   Thereasa Solo, MD 02/04/2019, 1:10 PM

## 2019-02-04 NOTE — Addendum Note (Signed)
Addended by: Joylene John D on: 02/04/2019 02:18 PM   Modules accepted: Orders

## 2019-02-04 NOTE — Progress Notes (Signed)
Follow-up Note: Gyn-Onc  Stacy Rivas 63 y.o. female  CC:  Chief Complaint  Patient presents with  . 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  Based on labs, physical exam and CT imaging, I feel that she is a good candidate for interval cytoreduction at this time with high probability for complete cytoreduction. I explained that even when there is no gross residual disease remaining after surgery, there is still microscopic residual disease which requires additional chemotherapy doses to control. Therefore 3 cycles (minimum) of adjuvant carb/tax will follow, and, if genetics testing reveals homologous recombination deficiency (germline or somatic) then I would recommend PARP inhibitor maintenance.  I extensively counseling Ms Vogelsang regarding what to expect from surgery with respect to laparotomy, possible bowel resection and risk including  bleeding, infection, damage to internal organs (such as bladder,ureters, bowels), blood clot, reoperation and rehospitalization. I explained the risk of bowel leak, stricture and colostomy.  I explained the risk of delayed resumption of chemotherapy should this develop.  She is at particularly high risk for postop VTE, and therefore will be on lovenox x 1 month postop.   HPI:  The patient was seen originally in consultation at the request of Dr. Pyrtle. PCP: Dr. Aronson for pelvic mass and ascites.  Patient is a very pleasant G1P0 who took some antibiotics in 11/19 for a tooth issue and did not feel "right" after that. She then began having worse reflux and bloating in 3/20. She states that due to COVID, she decided to "stick it out". She then communicated with Dr. Pyrtle and a CT scan was ordered 11/09/18:  -Hepatobiliary: No hepatic masses identified. Gallbladder is  unremarkable. -Adrenals/Urinary Tract: No masses identified. Several small less than 1 cm left renal calculi are identified. No evidence of ureteralcalculi or hydronephrosis. -Stomach/Bowel: Small hiatal hernia noted. No evidence of obstruction, inflammatory process or abnormal fluid collections.  -Vascular/Lymphatic: No pathologically enlarged lymph nodes. No abdominal aortic aneurysm.  -Reproductive: Retroflexed uterus is seen containing multiple small fibroids, largest in the anterior lower uterine segment measuring 5.5 cm and showing cystic degeneration.  -A large poorly defined soft tissue mass is seen in the central pelvis which is contiguous with the anterior wall of the retroflexed uterus. This measures 14.7 x 9.5 by 11.3 cm, and is suspicious for ovarian carcinoma, with uterine leiomyosarcoma considered a less likely differential diagnosis.  -Other: Moderate ascites is seen. Omental soft tissue caking and multiple peritoneal nodules are present in the abdomen and pelvis, consistent with diffuse peritoneal carcinomatosis. IMPRESSION: 1. 14.7 cm poorly defined soft tissue mass in central pelvis suspicious for primary ovarian carcinoma, with uterine leiomyosarcoma considered a less likely differential diagnosis. 2. Moderate ascites and diffuse peritoneal carcinomatosis. 3. Several small uterine fibroids.  She states that in retrospect, her symptoms of reflux have been getting worse but she thought that it was due to her antibiotics. She was also having increased diarrhea, lower abdominal pressure and fullness, increased gas and early satiety. She though that her hiatal hernia may also be worse. She states that she was always on the look out for bleeding as she is PMP, but was not aware of these symptoms. She went through MP around [age] but never took any HRT. She took OCPs for about 10 years cumulatively in college.  Health maintenance is up-to-date and she is not due for repeat  colonoscopy   until 2024.  She has never had an abnormal Pap smear.  Interval Hx:  At the time of diagnosis CA 125 was very elevated to 1434 on 11/13/18. Her Albumin was <3.5 and her large volume ascites required multiple paracenteses to control.   A tentative surgical date of June 25 have been set however due to scheduling issues this needed to be postponed.  Additionally given the multiple markers of low probability for complete cytoreduction (large volume ascites, primary tumor greater than 10 cm, Ca1 25 greater than 500, serum albumin less than 3.5, it was determined that a primary cytoreductive effort would likely be futile and subjected to high risk of complications.  Therefore, because neoadjuvant chemotherapy is associated with the same long-term survival from ovarian cancer but improved perioperative outcomes, a decision was made with the patient to proceed with tissue biopsy followed by neoadjuvant chemotherapy with carboplatin paclitaxel.  Cytology from her paracenteses confirmed adenocarcinoma favor GYN primary (this was performed on November 21, 2018).  She commenced her first of 3 cycles of neoadjuvant carboplatin paclitaxel on December 06, 2018.  CA 125 on 02/01/19 showed a reduction to 187.  Cycle 3 was administered on January 17, 2019.  Repeat CT scan of the abdomen and pelvis was performed on February 01, 2019.  It revealed the left complex ovarian mass now measured 8.6 x 5.8 x 7.7 cm decreased from 14.7 x 9.5 x 11.3 cm.  There was a small volume of residual abdominal ascites tracking beside the liver and spleen.  The peritoneal nodularity omental caking is improved but persistent with a dominant implant beneath the right mid anterior abdominal wall measuring 1 cm.  CMP Latest Ref Rng & Units 02/01/2019 01/17/2019 12/28/2018  Glucose 70 - 99 mg/dL 91 188(H) 165(H)  BUN 8 - 23 mg/dL 10 12 12  Creatinine 0.44 - 1.00 mg/dL 0.59 0.65 0.57  Sodium 135 - 145 mmol/L 141 141 141  Potassium 3.5 - 5.1  mmol/L 4.0 3.7 3.8  Chloride 98 - 111 mmol/L 106 108 108  CO2 22 - 32 mmol/L 28 23 21(L)  Calcium 8.9 - 10.3 mg/dL 9.0 9.3 9.3  Total Protein 6.5 - 8.1 g/dL 6.4(L) 6.9 6.5  Total Bilirubin 0.3 - 1.2 mg/dL 0.6 0.6 0.3  Alkaline Phos 38 - 126 U/L 208(H) 157(H) 158(H)  AST 15 - 41 U/L 63(H) 37 30  ALT 0 - 44 U/L 56(H) 26 15   CBC    Component Value Date/Time   WBC 2.4 (L) 02/01/2019 0943   WBC 7.9 12/06/2018 0855   RBC 3.61 (L) 02/01/2019 0943   HGB 10.3 (L) 02/01/2019 0943   HCT 32.2 (L) 02/01/2019 0943   PLT 203 02/01/2019 0943   MCV 89.2 02/01/2019 0943   MCH 28.5 02/01/2019 0943   MCHC 32.0 02/01/2019 0943   RDW 20.6 (H) 02/01/2019 0943   LYMPHSABS 1.5 02/01/2019 0943   MONOABS 0.5 02/01/2019 0943   EOSABS 0.1 02/01/2019 0943   BASOSABS 0.0 02/01/2019 0943    Review of Systems: Constitutional:Denies fever. + weight loss due to nerves. Also trying to walk about 2-3 miles per day Skin: No rash Cardiovascular: No chest pain, shortness of breath, or edema  Pulmonary: No cough  Gastro Intestinal: Reporting intermittent lower abdominal soreness.  No nausea, vomiting, constipation, + diarrhea reported. + early satiety Genitourinary: + pelvic pressure. Denies vaginal bleeding and discharge.  Musculoskeletal: Mild LBP   Psychology: Very nervous due to this diagnosis. She has xanax at home and has   been taking .125 prn  Current Meds:  Outpatient Encounter Medications as of 02/04/2019  Medication Sig  . ALPRAZolam (XANAX) 0.25 MG tablet Take 1 tablet (0.25 mg total) by mouth at bedtime as needed for anxiety. (Patient taking differently: Take 0.125 mg by mouth at bedtime as needed for anxiety. )  . carboxymethylcellulose (REFRESH PLUS) 0.5 % SOLN Place 1 drop into both eyes 2 (two) times daily as needed (dry eyes).  . Cholecalciferol (VITAMIN D3) 25 MCG (1000 UT) CAPS Take 1,000 Units by mouth daily.   [name] Kitchen dexamethasone (DECADRON) 4 MG tablet Take 1 tablet (4 mg total) by mouth daily.   [name] Kitchen esomeprazole (NEXIUM) 20 MG capsule Take 20 mg by mouth daily at 12 noon.  . lidocaine-prilocaine (EMLA) cream Apply to affected area once  . Multiple Vitamin (MULTIVITAMIN) capsule Take 1 capsule by mouth daily.  . ondansetron (ZOFRAN) 8 MG tablet Take 1 tablet (8 mg total) by mouth every 8 (eight) hours as needed for refractory nausea / vomiting.  . prochlorperazine (COMPAZINE) 10 MG tablet Take 1 tablet (10 mg total) by mouth every 6 (six) hours as needed (Nausea or vomiting).  [name] Kitchen senna-docusate (SENOKOT-S) 8.6-50 MG tablet Take 2 tablets by mouth at bedtime. For AFTER surgery, do not take if having diarrhea  . traMADol (ULTRAM) 50 MG tablet Take 1 tablet (50 mg total) by mouth every 6 (six) hours as needed.  [name] Kitchen ibuprofen (ADVIL) 800 MG tablet Take 1 tablet (800 mg total) by mouth every 8 (eight) hours as needed for moderate pain. For AFTER surgery (Patient not taking: Reported on 02/04/2019)  . Melatonin 5 MG SUBL Place under the tongue daily as needed.  [name] Kitchen oxyCODONE (OXY IR/ROXICODONE) 5 MG immediate release tablet Take 1 tablet (5 mg total) by mouth every 4 (four) hours as needed for severe pain. For AFTER surgery, do not take and drive (Patient not taking: Reported on 02/04/2019)  . [DISCONTINUED] pantoprazole (PROTONIX) 40 MG tablet Take 1 tablet (40 mg total) by mouth 2 (two) times daily before a meal. (Patient not taking: Reported on 11/16/2018)   No facility-administered encounter medications on file as of 02/04/2019.     Allergy:  Allergies  Allergen Reactions  . Shrimp [Shellfish Allergy] Itching  . Sulfonamide Derivatives Itching and Nausea Only    Social Hx:   Social History   Socioeconomic History  . Marital status: Married    Spouse name: Not on file  . Number of children: 0  . Years of education: Not on file  . Highest education level: Not on file  Occupational History  . Occupation: Geographical information systems officer  . Financial resource strain: Not on file  . Food insecurity     Worry: Not on file    Inability: Not on file  . Transportation needs    Medical: Not on file    Non-medical: Not on file  Tobacco Use  . Smoking status: Former Smoker    Years: 10.00    Types: Cigarettes    Quit date: 05/23/1980    Years since quitting: 38.7  . Smokeless tobacco: Never Used  . Tobacco comment: quit 1984  Substance and Sexual Activity  . Alcohol use: Yes    Alcohol/week: 0.0 standard drinks    Comment: 2/day, 11-21-2018   no alcodholint he last month   . Drug use: No  . Sexual activity: Yes    Partners: Male  Lifestyle  . Physical activity    Days per week: Not on file  Minutes per session: Not on file  . Stress: Not on file  Relationships  . Social connections    Talks on phone: Not on file    Gets together: Not on file    Attends religious service: Not on file    Active member of club or organization: Not on file    Attends meetings of clubs or organizations: Not on file    Relationship status: Not on file  . Intimate partner violence    Fear of current or ex partner: Not on file    Emotionally abused: Not on file    Physically abused: Not on file    Forced sexual activity: Not on file  Other Topics Concern  . Not on file  Social History Narrative  . Not on file    Past Surgical Hx:  Past Surgical History:  Procedure Laterality Date  . CATARACT EXTRACTION, BILATERAL    . cystoscopy with instillation for cystitis  2001  . DILATION AND CURETTAGE OF UTERUS     miscarriage  . IR IMAGING GUIDED PORT INSERTION  12/06/2018  . IR PARACENTESIS  12/03/2018  . IR PARACENTESIS  12/14/2018  . LAPAROSCOPY     adhesions  . LITHOTRIPSY  07/2013   x 2  . MYOMECTOMY  1992  . PARACENTESIS  11/21/2018   abdominal , removed 3.1 Liters     Past Medical Hx:  Past Medical History:  Diagnosis Date  . Anxiety state, unspecified   . Diverticulosis   . Esophageal reflux   . Fibroid tumor   . Headache    due to allergies   . Hiatal hernia   . Hyperlipidemia    . IBS (irritable bowel syndrome)   . Internal hemorrhoids   . Nonspecific elevation of levels of transaminase or lactic acid dehydrogenase (LDH)   . Renal stone 07/2013  . Tubulovillous adenoma of colon   . Varicose veins    superficail thrombophlebitis (left LE)    Oncology Hx:  Oncology History  Ovarian cancer (HCC)  11/09/2018 Imaging   1. 14.7 cm poorly defined soft tissue mass in central pelvis suspicious for primary ovarian carcinoma, with uterine leiomyosarcoma considered a less likely differential diagnosis. 2. Moderate ascites and diffuse peritoneal carcinomatosis. 3. Several small uterine fibroids.   11/13/2018 Tumor Marker   Patient's tumor was tested for the following markers: CA-125 Results of the tumor marker test revealed 1434   11/21/2018 Procedure   Successful ultrasound-guided diagnostic and therapeutic paracentesis yielding 3.1 liters of peritoneal fluid   11/21/2018 Pathology Results   PERITONEAL/ASCITIC FLUID(SPECIMEN 1 OF 1 COLLECTED 11/21/18): ADENOCARCINOMA. Specimen Clinical Information Pelvic mass suspicious for ovarian cancer Source Peritoneal/Ascitic Fluid, (specimen 1 of 1 collected 11/21/18) Gross Specimen: Received is/are 1000 ccs of dark amber fluid. (CM:cm) Prepared: # Smears: 0 # Concentration Technique Slides (i.e. 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   11/29/2018 Initial Diagnosis   Ovarian cancer (HCC)   11/29/2018 Cancer Staging   Staging form: Ovary, Fallopian Tube, and Primary Peritoneal Carcinoma, AJCC 8th Edition - Clinical: cT3, cN0, cM0 - Signed by Gorsuch, Ni, MD on 11/29/2018   12/03/2018 Procedure   Successful ultrasound-guided therapeutic paracentesis yielding 3.7 liters of peritoneal fluid.     12/06/2018 Procedure   Placement of a subcutaneous port device. Catheter tip at the SVC and right atrium junction   12/14/2018 Procedure    Successful ultrasound-guided paracentesis yielding 2.9 L of   peritoneal fluid   12/28/2018 Tumor Marker   Patient's tumor was tested for the following markers: CA-125 Results of the tumor marker test revealed 812.   02/01/2019 Imaging   Ct abdomen and pelvis 8.6 cm left ovarian mass, corresponding to the patient's known primary neoplasm, improved.   Mild peritoneal nodularity/omental caking, improved.   Small abdominopelvic ascites, improved.     02/01/2019 Tumor Marker   Patient's tumor was tested for the following markers: CA-125 Results of the tumor marker test revealed 183.     Family Hx:  Family History  Problem Relation Age of Onset  . Lymphoma Father   . Heart disease Father   . Hypertension Father   . Hypertension Mother   . Hyperlipidemia Mother   . Breast cancer Mother 92  . Diabetes Maternal Grandmother   . Uterine cancer Maternal Grandmother   . Stomach cancer Paternal Grandfather   . Colon cancer Neg Hx     Vitals:  Blood pressure 137/75, pulse 83, temperature 98.5 F (36.9 C), temperature source Temporal, resp. rate 18, SpO2 100 %.  Physical Exam: Well-nourished well-developed female in no acute distress.  Neck: Supple, no lymphadenopathy, no thyromegaly.  Abdomen: Well-healed laparoscopic incision.  Palpable fluid wave.  Abdomen is distended.  There is no rebound or guarding.  There is a fullness felt best towards the left side just below the umbilicus.  Groins: No lymphadenopathy.  Extremities: No edema.  Pelvic: External genitalia within normal limits.  The vagina is atrophic.  The cervix is deviated anteriorly.  It is nulliparous.  There is no visible lesions.  There is a physiologic discharge.  Bimanual examination reveals a cervix to be deviated anteriorly.  The cervix is palpably normal.  I cannot separate the uterus from the large abdominal pelvic mass measuring approximately 8 cm.  It appears to be leaning more towards the patient's left side.  It  is now freely mobile.  On rectovaginal examination there is no obviously nodularity felt and the rectal mucosa is smooth.   Thereasa Solo, MD 02/04/2019, 1:10 PM

## 2019-02-06 ENCOUNTER — Telehealth: Payer: Self-pay | Admitting: Oncology

## 2019-02-06 NOTE — Telephone Encounter (Signed)
Called [facility] and advised her that lab and flush appointment on 02/08/19 have been canceled.  Also asked if she would like her labs drawn from her port or peripherally on Thursday and she said peripherally.  Flush appointment on 02/07/19 has been canceled per patient request.

## 2019-02-07 ENCOUNTER — Telehealth: Payer: Self-pay

## 2019-02-07 ENCOUNTER — Other Ambulatory Visit: Payer: Managed Care, Other (non HMO)

## 2019-02-07 ENCOUNTER — Inpatient Hospital Stay: Payer: Managed Care, Other (non HMO)

## 2019-02-07 ENCOUNTER — Other Ambulatory Visit: Payer: Self-pay

## 2019-02-07 DIAGNOSIS — C569 Malignant neoplasm of unspecified ovary: Secondary | ICD-10-CM | POA: Diagnosis not present

## 2019-02-07 DIAGNOSIS — D701 Agranulocytosis secondary to cancer chemotherapy: Secondary | ICD-10-CM

## 2019-02-07 DIAGNOSIS — T451X5A Adverse effect of antineoplastic and immunosuppressive drugs, initial encounter: Secondary | ICD-10-CM

## 2019-02-07 LAB — CBC WITH DIFFERENTIAL (CANCER CENTER ONLY)
Abs Immature Granulocytes: 0.03 10*3/uL (ref 0.00–0.07)
Basophils Absolute: 0 10*3/uL (ref 0.0–0.1)
Basophils Relative: 1 %
Eosinophils Absolute: 0.1 10*3/uL (ref 0.0–0.5)
Eosinophils Relative: 1 %
HCT: 36.1 % (ref 36.0–46.0)
Hemoglobin: 11.5 g/dL — ABNORMAL LOW (ref 12.0–15.0)
Immature Granulocytes: 1 %
Lymphocytes Relative: 38 %
Lymphs Abs: 1.8 10*3/uL (ref 0.7–4.0)
MCH: 29.1 pg (ref 26.0–34.0)
MCHC: 31.9 g/dL (ref 30.0–36.0)
MCV: 91.4 fL (ref 80.0–100.0)
Monocytes Absolute: 0.5 10*3/uL (ref 0.1–1.0)
Monocytes Relative: 11 %
Neutro Abs: 2.4 10*3/uL (ref 1.7–7.7)
Neutrophils Relative %: 48 %
Platelet Count: 249 10*3/uL (ref 150–400)
RBC: 3.95 MIL/uL (ref 3.87–5.11)
RDW: 21.7 % — ABNORMAL HIGH (ref 11.5–15.5)
WBC Count: 4.9 10*3/uL (ref 4.0–10.5)
nRBC: 0 % (ref 0.0–0.2)

## 2019-02-07 NOTE — Telephone Encounter (Addendum)
-----   Message from Dorothyann Gibbs, NP sent at 02/07/2019  9:34 AM EDT ----- Can you let her know that her white count is normal and she is good to go for the 22nd.    THank you!@ ----- Message ----- From: Buel Ream, [facility] [place] Sent: 02/07/2019   9:10 AM EDT To: Dorothyann Gibbs, NP

## 2019-02-07 NOTE — Patient Instructions (Addendum)
DUE TO COVID-19 ONLY ONE VISITOR IS ALLOWED TO COME WITH YOU AND STAY IN THE WAITING ROOM ONLY DURING PRE OP AND PROCEDURE DAY OF SURGERY. THE 1 VISITOR MAY VISIT WITH YOU AFTER SURGERY IN YOUR PRIVATE ROOM DURING VISITING HOURS ONLY!  YOU NEED TO HAVE A COVID 19 TEST ON_9-18-2020, PLEASE CONTNUE THE QUARANTINE INSTRUCTIONS AS OUTLINED IN YOUR HANDOUT.                Stacy Rivas     Your procedure is scheduled on: 02-12-2019   Report to [facility] Main  Entrance    Report to Admitting at 9:15AM     Call this number if you have problems the morning of surgery [phone]     Remember: [place] To perform bowel prep below:   [place] supplies for the bowel prep at a pharmacy of your choice:  Office-e-mailed prescriptions for your antibiotic pills (Neomycin & Erythromycin)  2 bottles of magnesium citrate- no prescription required    Change your diet to make the bowel prep go more easily:  Switch to a bland, low fiber diet  Stop eating any nuts, popcorn, or fruit with seeds.  Stop all fiber supplements such as Metamucil, Miralax, etc.    Improve nutrition:  Consider drinking 2-3 nutritional shakes (Ex: Ensure Surgery) every day, starting 5 days prior to surgery    DAY PRIOR TO SURGERY   Switch to a full liquid diet the day before surgery  Drink plenty of liquids all day to avoid getting dehydrated    12:00pm  Drink two bottles of magnesium citrate.  (You should finish in 2 hours)   2:00pm  Take 2 Neomycin 500mg  tablets & 2 Erythromycin 500mg  tablets   3:00pm  Take 2 Neomycin 500mg  tablets & 2 Erythromycin 500mg  tablets  Drink plenty of clear liquids all evening to avoid getting dehydrated   10:00pm  Take 2 Neomycin 500mg  tablets & 2 Erythromycin 500mg  tablets  Drink 2 Carbohydrate loading nutrition drinks (ex: Ensure Presurgery). These will be given at  pre-op appointment.  Do not eat anything solid after bedtime (midnight) the night before your surgery.   BUT DO drink plenty of clear liquids (Water, Gatorade, juice, soda, coffee, tea, broths, etc.) up to 3 hours prior to surgery to avoid getting dehydrated.    MORNING OF SURGERY   Remember to not to eat anything solid that morning  Drink one final carbohydrate loading nutritional drink (ex: Ensure Presurgery) upon waking up in the morning at 8:15am (needs to be 3 hours before your surgery).  Hold or take medications as recommended by the hospital staff at your Preoperative visit  Stop drinking liquids before you leave the house (>3 hours prior to surgery)     If you have questions or concerns, please call GYN Oncology Office at [phone] during business hours to speak to the clinical staff for advice.   BRUSH YOUR TEETH MORNING OF SURGERY AND RINSE YOUR MOUTH OUT, NO CHEWING GUM CANDY OR MINTS.     Take these medicines the morning of surgery with A SIP OF WATER: NEXIUM , OXYCODONE IF NEEDED, CLARITIN, IF NEEDED                                 You may not have any metal  on your body including hair pins and              piercings     Do not wear jewelry, make-up, lotions, powders or perfumes, deodorant              Do not wear nail polish.  Do not shave  48 hours prior to surgery.              Men may shave face and neck.   Do not bring valuables to the hospital. [place].  Contacts, dentures or bridgework may not be worn into surgery.  Leave suitcase in the car. After surgery it may be brought to your room.     :  Special Instructions: N/A              Please read over the following fact sheets you were given: _____________________________________________________________________             [facility] - Preparing for Surgery Before surgery, you can play an important role.  Because skin is not sterile, your skin  needs to be as free of germs as possible.  You can reduce the number of germs on your skin by washing with CHG (chlorahexidine gluconate) soap before surgery.  CHG is an antiseptic cleaner which kills germs and bonds with the skin to continue killing germs even after washing. Please DO NOT use if you have an allergy to CHG or antibacterial soaps.  If your skin becomes reddened/irritated stop using the CHG and inform your nurse when you arrive at Short Stay. Do not shave (including legs and underarms) for at least 48 hours prior to the first CHG shower.  You may shave your face/neck. Please follow these instructions carefully:  1.  Shower with CHG Soap the night before surgery and the  morning of Surgery.  2.  If you choose to wash your hair, wash your hair first as usual with your  normal  shampoo.  3.  After you shampoo, rinse your hair and body thoroughly to remove the  shampoo.                           4.  Use CHG as you would any other liquid soap.  You can apply chg directly  to the skin and wash                       Gently with a scrungie or clean washcloth.  5.  Apply the CHG Soap to your body ONLY FROM THE NECK DOWN.   Do not use on face/ open                           Wound or open sores. Avoid contact with eyes, ears mouth and genitals (private parts).                       Wash face,  Genitals (private parts) with your normal soap.             6.  Wash thoroughly, paying special attention to the area where your surgery  will be performed.  7.  Thoroughly rinse your body with warm water from the neck down.  8.  DO NOT shower/wash with your normal  soap after using and rinsing off  the CHG Soap.                9.  Pat yourself dry with a clean towel.            10.  Wear clean pajamas.            11.  Place clean sheets on your bed the night of your first shower and do not  sleep with pets. Day of Surgery : Do not apply any lotions/deodorants the morning of surgery.  Please wear clean  clothes to the hospital/surgery center.  FAILURE TO FOLLOW THESE INSTRUCTIONS MAY RESULT IN THE CANCELLATION OF YOUR SURGERY PATIENT SIGNATURE_________________________________  NURSE SIGNATURE__________________________________  ________________________________________________________________________   Adam Phenix  An incentive spirometer is a tool that can help keep your lungs clear and active. This tool measures how well you are filling your lungs with each breath. Taking long deep breaths may help reverse or decrease the chance of developing breathing (pulmonary) problems (especially infection) following:  A long period of time when you are unable to move or be active. BEFORE THE PROCEDURE   If the spirometer includes an indicator to show your best effort, your nurse or respiratory therapist will set it to a desired goal.  If possible, sit up straight or lean slightly forward. Try not to slouch.  Hold the incentive spirometer in an upright position. INSTRUCTIONS FOR USE  1. Sit on the edge of your bed if possible, or sit up as far as you can in bed or on a chair. 2. Hold the incentive spirometer in an upright position. 3. Breathe out normally. 4. Place the mouthpiece in your mouth and seal your lips tightly around it. 5. Breathe in slowly and as deeply as possible, raising the piston or the ball toward the top of the column. 6. Hold your breath for 3-5 seconds or for as long as possible. Allow the piston or ball to fall to the bottom of the column. 7. Remove the mouthpiece from your mouth and breathe out normally. 8. Rest for a few seconds and repeat Steps 1 through 7 at least 10 times every 1-2 hours when you are awake. Take your time and take a few normal breaths between deep breaths. 9. The spirometer may include an indicator to show your best effort. Use the indicator as a goal to work toward during each repetition. 10. After each set of 10 deep breaths, practice  coughing to be sure your lungs are clear. If you have an incision (the cut made at the time of surgery), support your incision when coughing by placing a pillow or rolled up towels firmly against it. Once you are able to get out of bed, walk around indoors and cough well. You may stop using the incentive spirometer when instructed by your caregiver.  RISKS AND COMPLICATIONS  Take your time so you do not get dizzy or light-headed.  If you are in pain, you may need to take or ask for pain medication before doing incentive spirometry. It is harder to take a deep breath if you are having pain. AFTER USE  Rest and breathe slowly and easily.  It can be helpful to keep track of a log of your progress. Your caregiver can provide you with a simple table to help with this. If you are using the spirometer at home, follow these instructions: [place] IF:   You are having difficultly using the spirometer.  You have trouble using the spirometer as often as instructed.  Your pain medication is not giving enough relief while using the spirometer.  You develop fever of 100.5 F (38.1 C) or higher. SEEK IMMEDIATE MEDICAL CARE IF:   You cough up bloody sputum that had not been present before.  You develop fever of 102 F (38.9 C) or greater.  You develop worsening pain at or near the incision site. MAKE SURE YOU:   Understand these instructions.  Will watch your condition.  Will get help right away if you are not doing well or get worse. Document Released: 09/19/2006 Document Revised: 08/01/2011 Document Reviewed: 11/20/2006 Exit[employer] Patient Information 2014 [employer], [place].   ________________________________________________________________________  WHAT IS A BLOOD TRANSFUSION? Blood Transfusion Information  A transfusion is the replacement of blood or some of its parts. Blood is made up of multiple cells which provide different functions.  Red blood cells carry oxygen and are  used for blood loss replacement.  White blood cells fight against infection.  Platelets control bleeding.  Plasma helps clot blood.  Other blood products are available for specialized needs, such as hemophilia or other clotting disorders. BEFORE THE TRANSFUSION  Who gives blood for transfusions?   Healthy volunteers who are fully evaluated to make sure their blood is safe. This is blood bank blood. Transfusion therapy is the safest it has ever been in the practice of medicine. Before blood is taken from a donor, a complete history is taken to make sure that person has no history of diseases nor engages in risky social behavior (examples are intravenous drug use or sexual activity with multiple partners). The donor's travel history is screened to minimize risk of transmitting infections, such as malaria. The donated blood is tested for signs of infectious diseases, such as HIV and hepatitis. The blood is then tested to be sure it is compatible with you in order to minimize the chance of a transfusion reaction. If you or a relative donates blood, this is often done in anticipation of surgery and is not appropriate for emergency situations. It takes many days to process the donated blood. RISKS AND COMPLICATIONS Although transfusion therapy is very safe and saves many lives, the main dangers of transfusion include:   Getting an infectious disease.  Developing a transfusion reaction. This is an allergic reaction to something in the blood you were given. Every precaution is taken to prevent this. The decision to have a blood transfusion has been considered carefully by your caregiver before blood is given. Blood is not given unless the benefits outweigh the risks. AFTER THE TRANSFUSION  Right after receiving a blood transfusion, you will usually feel much better and more energetic. This is especially true if your red blood cells have gotten low (anemic). The transfusion raises the level of the red  blood cells which carry oxygen, and this usually causes an energy increase.  The nurse administering the transfusion will monitor you carefully for complications. HOME CARE INSTRUCTIONS  No special instructions are needed after a transfusion. You may find your energy is better. Speak with your caregiver about any limitations on activity for underlying diseases you may have. SEEK MEDICAL CARE IF:   Your condition is not improving after your transfusion.  You develop redness or irritation at the intravenous (IV) site. SEEK IMMEDIATE MEDICAL CARE IF:  Any of the following symptoms occur over the next 12 hours:  Shaking chills.  You have a temperature by mouth above 102 F (38.9 C), not  controlled by medicine.  Chest, back, or muscle pain.  People around you feel you are not acting correctly or are confused.  Shortness of breath or difficulty breathing.  Dizziness and fainting.  You get a rash or develop hives.  You have a decrease in urine output.  Your urine turns a dark color or changes to pink, red, or brown. Any of the following symptoms occur over the next 10 days:  You have a temperature by mouth above 102 F (38.9 C), not controlled by medicine.  Shortness of breath.  Weakness after normal activity.  The white part of the eye turns yellow (jaundice).  You have a decrease in the amount of urine or are urinating less often.  Your urine turns a dark color or changes to pink, red, or brown. Document Released: 05/06/2000 Document Revised: 08/01/2011 Document Reviewed: 12/24/2007 [facility] Patient Information 2014 [place], [place].  _______________________________________________________________________

## 2019-02-07 NOTE — Telephone Encounter (Signed)
Gave Stacy Rivas the results as noted below with confirmation fo surgery for 02-12-19. Pt verbalized understanding.

## 2019-02-08 ENCOUNTER — Other Ambulatory Visit (HOSPITAL_COMMUNITY)
Admission: RE | Admit: 2019-02-08 | Discharge: 2019-02-08 | Disposition: A | Discharge status: Home / Self Care | Payer: Managed Care, Other (non HMO) | Source: Ambulatory Visit | Attending: Gynecologic Oncology | Admitting: Gynecologic Oncology

## 2019-02-08 ENCOUNTER — Other Ambulatory Visit: Payer: Managed Care, Other (non HMO)

## 2019-02-08 ENCOUNTER — Ambulatory Visit: Payer: Managed Care, Other (non HMO)

## 2019-02-08 ENCOUNTER — Ambulatory Visit: Payer: Managed Care, Other (non HMO) | Admitting: Hematology and Oncology

## 2019-02-08 DIAGNOSIS — Z01812 Encounter for preprocedural laboratory examination: Secondary | ICD-10-CM | POA: Insufficient documentation

## 2019-02-08 DIAGNOSIS — Z20828 Contact with and (suspected) exposure to other viral communicable diseases: Secondary | ICD-10-CM | POA: Insufficient documentation

## 2019-02-08 NOTE — Progress Notes (Signed)
02-04-19 (Epic) CBC w/Diff

## 2019-02-09 LAB — NOVEL CORONAVIRUS, NAA (HOSP ORDER, SEND-OUT TO REF LAB; TAT 18-24 HRS): SARS-CoV-2, NAA: NOT DETECTED

## 2019-02-11 ENCOUNTER — Encounter (HOSPITAL_COMMUNITY): Payer: Self-pay

## 2019-02-11 ENCOUNTER — Other Ambulatory Visit: Payer: Self-pay

## 2019-02-11 ENCOUNTER — Encounter (HOSPITAL_COMMUNITY)
Admission: RE | Admit: 2019-02-11 | Discharge: 2019-02-11 | Disposition: A | Discharge status: Home / Self Care | Payer: Managed Care, Other (non HMO) | Source: Ambulatory Visit | Attending: Gynecologic Oncology | Admitting: Gynecologic Oncology

## 2019-02-11 ENCOUNTER — Telehealth: Payer: Self-pay

## 2019-02-11 DIAGNOSIS — C569 Malignant neoplasm of unspecified ovary: Secondary | ICD-10-CM

## 2019-02-11 DIAGNOSIS — Z01812 Encounter for preprocedural laboratory examination: Secondary | ICD-10-CM | POA: Insufficient documentation

## 2019-02-11 LAB — URINALYSIS, ROUTINE W REFLEX MICROSCOPIC
Bilirubin Urine: NEGATIVE
Glucose, UA: NEGATIVE mg/dL
Hgb urine dipstick: NEGATIVE
Ketones, ur: NEGATIVE mg/dL
Leukocytes,Ua: NEGATIVE
Nitrite: NEGATIVE
Protein, ur: NEGATIVE mg/dL
Specific Gravity, Urine: 1.003 — ABNORMAL LOW (ref 1.005–1.030)
pH: 7 (ref 5.0–8.0)

## 2019-02-11 LAB — COMPREHENSIVE METABOLIC PANEL
ALT: 45 U/L — ABNORMAL HIGH (ref 0–44)
AST: 63 U/L — ABNORMAL HIGH (ref 15–41)
Albumin: 3.8 g/dL (ref 3.5–5.0)
Alkaline Phosphatase: 136 U/L — ABNORMAL HIGH (ref 38–126)
Anion gap: 6 (ref 5–15)
BUN: 16 mg/dL (ref 8–23)
CO2: 27 mmol/L (ref 22–32)
Calcium: 9 mg/dL (ref 8.9–10.3)
Chloride: 107 mmol/L (ref 98–111)
Creatinine, Ser: 0.34 mg/dL — ABNORMAL LOW (ref 0.44–1.00)
GFR calc Af Amer: >60 mL/min (ref >60)
GFR calc non Af Amer: >60 mL/min (ref >60)
Glucose, Bld: 95 mg/dL (ref 70–99)
Potassium: 4.1 mmol/L (ref 3.5–5.1)
Sodium: 140 mmol/L (ref 135–145)
Total Bilirubin: 0.9 mg/dL (ref 0.3–1.2)
Total Protein: 6.6 g/dL (ref 6.5–8.1)

## 2019-02-11 MED ORDER — ENSURE PRE-SURGERY PO LIQD
296.0000 mL | Freq: Once | ORAL | Status: DC
  Filled 2019-02-11: qty 296

## 2019-02-11 MED ORDER — ENSURE PRE-SURGERY PO LIQD
592.0000 mL | Freq: Once | ORAL | Status: DC
  Filled 2019-02-11: qty 592

## 2019-02-11 NOTE — Telephone Encounter (Signed)
Ms Stacy Rivas just began the bowel prep. She is concerned about taking the ATB on empty stomach.Told her that Dr. Denman Rivas changed the bowel prep on 01-25-19 to add the ATB as as well. She will call the office if she has any problems with nausea. Ms Stacy Rivas understands pre op instructions.

## 2019-02-11 NOTE — Anesthesia Preprocedure Evaluation (Addendum)
Anesthesia Evaluation   Patient awake    Reviewed: Allergy & Precautions, Patient's Chart, lab work & pertinent test results  Airway Mallampati: I  TM Distance: >3 FB Neck ROM: Full    Dental no notable dental hx. (+) Teeth Intact, Dental Advisory Given   Pulmonary former smoker,  Quit smoking 1982   Pulmonary exam normal breath sounds clear to auscultation       Cardiovascular negative cardio ROS Normal cardiovascular exam Rhythm:Regular Rate:Normal     Neuro/Psych  Headaches, PSYCHIATRIC DISORDERS Anxiety    GI/Hepatic Neg liver ROS, hiatal hernia, GERD  Medicated,Tubulovillous adenocarcinoma bowel IBS Diverticulosis Hemorrhoids    Endo/Other  negative endocrine ROS  Renal/GU Hx nephrolithiasis   Ovarian ca    Musculoskeletal negative musculoskeletal ROS (+)   Abdominal Normal abdominal exam  (+)   Peds  Hematology  (+) anemia ,   Anesthesia Other Findings 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   Mild peritoneal nodularity/omental caking, improved.   Small abdominopelvic ascites, improved.  Reproductive/Obstetrics negative OB ROS                            Anesthesia Physical Anesthesia Plan  ASA: III  Anesthesia Plan: General   Post-op Pain Management:    Induction: Intravenous  PONV Risk Score and Plan: 4 or greater and Ondansetron, Dexamethasone, Midazolam, Treatment may vary due to age or medical condition and Metaclopromide  Airway Management Planned: Oral ETT  Additional Equipment: None  Intra-op Plan:   Post-operative Plan: Extubation in OR  Informed Consent: I have reviewed the patients History and Physical, chart, labs and discussed the procedure including the risks, benefits and alternatives for the proposed anesthesia with the  patient or authorized representative who has indicated his/her understanding and acceptance.     Dental advisory given  Plan Discussed with: CRNA  Anesthesia Plan Comments:         Anesthesia Quick Evaluation

## 2019-02-12 ENCOUNTER — Other Ambulatory Visit: Payer: Self-pay | Admitting: Gynecologic Oncology

## 2019-02-12 ENCOUNTER — Inpatient Hospital Stay (HOSPITAL_COMMUNITY): Payer: Managed Care, Other (non HMO) | Admitting: Anesthesiology

## 2019-02-12 ENCOUNTER — Inpatient Hospital Stay (HOSPITAL_COMMUNITY)
Admission: RE | Admit: 2019-02-12 | Discharge: 2019-02-15 | DRG: 737 | Disposition: A | Discharge status: Home / Self Care | Payer: Managed Care, Other (non HMO) | Attending: Gynecologic Oncology | Admitting: Gynecologic Oncology

## 2019-02-12 ENCOUNTER — Encounter (HOSPITAL_COMMUNITY)
Admission: RE | Disposition: A | Discharge status: Home / Self Care | Payer: Self-pay | Source: Home / Self Care | Attending: Gynecologic Oncology

## 2019-02-12 ENCOUNTER — Inpatient Hospital Stay (HOSPITAL_COMMUNITY): Payer: Managed Care, Other (non HMO) | Admitting: Physician Assistant

## 2019-02-12 ENCOUNTER — Encounter (HOSPITAL_COMMUNITY): Payer: Self-pay

## 2019-02-12 DIAGNOSIS — K589 Irritable bowel syndrome without diarrhea: Secondary | ICD-10-CM | POA: Diagnosis present

## 2019-02-12 DIAGNOSIS — Z803 Family history of malignant neoplasm of breast: Secondary | ICD-10-CM

## 2019-02-12 DIAGNOSIS — Z91013 Allergy to seafood: Secondary | ICD-10-CM | POA: Diagnosis not present

## 2019-02-12 DIAGNOSIS — E785 Hyperlipidemia, unspecified: Secondary | ICD-10-CM | POA: Diagnosis present

## 2019-02-12 DIAGNOSIS — Z79899 Other long term (current) drug therapy: Secondary | ICD-10-CM

## 2019-02-12 DIAGNOSIS — Z87891 Personal history of nicotine dependence: Secondary | ICD-10-CM | POA: Diagnosis not present

## 2019-02-12 DIAGNOSIS — Z882 Allergy status to sulfonamides status: Secondary | ICD-10-CM

## 2019-02-12 DIAGNOSIS — C562 Malignant neoplasm of left ovary: Secondary | ICD-10-CM | POA: Diagnosis present

## 2019-02-12 DIAGNOSIS — R188 Other ascites: Secondary | ICD-10-CM | POA: Diagnosis present

## 2019-02-12 DIAGNOSIS — Z8249 Family history of ischemic heart disease and other diseases of the circulatory system: Secondary | ICD-10-CM | POA: Diagnosis not present

## 2019-02-12 DIAGNOSIS — F419 Anxiety disorder, unspecified: Secondary | ICD-10-CM | POA: Diagnosis present

## 2019-02-12 DIAGNOSIS — Z808 Family history of malignant neoplasm of other organs or systems: Secondary | ICD-10-CM

## 2019-02-12 DIAGNOSIS — C786 Secondary malignant neoplasm of retroperitoneum and peritoneum: Secondary | ICD-10-CM | POA: Diagnosis present

## 2019-02-12 DIAGNOSIS — C569 Malignant neoplasm of unspecified ovary: Secondary | ICD-10-CM | POA: Diagnosis not present

## 2019-02-12 DIAGNOSIS — K449 Diaphragmatic hernia without obstruction or gangrene: Secondary | ICD-10-CM | POA: Diagnosis present

## 2019-02-12 DIAGNOSIS — D62 Acute posthemorrhagic anemia: Secondary | ICD-10-CM | POA: Diagnosis not present

## 2019-02-12 DIAGNOSIS — Z807 Family history of other malignant neoplasms of lymphoid, hematopoietic and related tissues: Secondary | ICD-10-CM | POA: Diagnosis not present

## 2019-02-12 DIAGNOSIS — D259 Leiomyoma of uterus, unspecified: Secondary | ICD-10-CM | POA: Diagnosis present

## 2019-02-12 DIAGNOSIS — K219 Gastro-esophageal reflux disease without esophagitis: Secondary | ICD-10-CM | POA: Diagnosis present

## 2019-02-12 DIAGNOSIS — Z800 Family history of malignant neoplasm of digestive organs: Secondary | ICD-10-CM | POA: Diagnosis not present

## 2019-02-12 HISTORY — PX: OMENTECTOMY: SHX5985

## 2019-02-12 HISTORY — PX: HYSTERECTOMY ABDOMINAL WITH SALPINGO-OOPHORECTOMY: SHX6792

## 2019-02-12 HISTORY — PX: DEBULKING: SHX6277

## 2019-02-12 LAB — TYPE AND SCREEN
ABO/RH(D): A POS
Antibody Screen: NEGATIVE

## 2019-02-12 SURGERY — HYSTERECTOMY, ABDOMINAL, WITH SALPINGO-OOPHORECTOMY
Anesthesia: General

## 2019-02-12 MED ORDER — FENTANYL CITRATE (PF) 100 MCG/2ML IJ SOLN
INTRAMUSCULAR | Status: AC
  Filled 2019-02-12: qty 2

## 2019-02-12 MED ORDER — LIDOCAINE 20MG/ML (2%) 15 ML SYRINGE OPTIME
INTRAMUSCULAR | Status: DC | PRN
  Administered 2019-02-12: 1.5 mg/kg/h via INTRAVENOUS

## 2019-02-12 MED ORDER — MEPERIDINE HCL 50 MG/ML IJ SOLN: 6.2500 mg | INTRAMUSCULAR | Status: DC | PRN

## 2019-02-12 MED ORDER — SODIUM CHLORIDE 0.9 % IV SOLN
2.0000 g | INTRAVENOUS | Status: AC
  Administered 2019-02-12 (×2): 2 g via INTRAVENOUS
  Filled 2019-02-12 (×2): qty 2

## 2019-02-12 MED ORDER — LIDOCAINE 2% (20 MG/ML) 5 ML SYRINGE
INTRAMUSCULAR | Status: DC | PRN
  Administered 2019-02-12: 60 mg via INTRAVENOUS

## 2019-02-12 MED ORDER — EPHEDRINE 5 MG/ML INJ
INTRAVENOUS | Status: AC
  Filled 2019-02-12: qty 10

## 2019-02-12 MED ORDER — OXYCODONE HCL 5 MG PO TABS
5.0000 mg | ORAL_TABLET | ORAL | Status: DC | PRN
  Administered 2019-02-12 – 2019-02-15 (×7): 5 mg via ORAL
  Filled 2019-02-12 (×7): qty 1

## 2019-02-12 MED ORDER — ALBUMIN HUMAN 5 % IV SOLN
12.5000 g | Freq: Once | INTRAVENOUS | Status: AC
  Administered 2019-02-12: 12.5 g via INTRAVENOUS

## 2019-02-12 MED ORDER — CHLORHEXIDINE GLUCONATE CLOTH 2 % EX PADS
6.0000 | MEDICATED_PAD | Freq: Every day | CUTANEOUS | Status: DC
  Administered 2019-02-12 – 2019-02-15 (×3): 6 via TOPICAL

## 2019-02-12 MED ORDER — ROCURONIUM BROMIDE 10 MG/ML (PF) SYRINGE
PREFILLED_SYRINGE | INTRAVENOUS | Status: AC
  Filled 2019-02-12: qty 10

## 2019-02-12 MED ORDER — ENSURE ENLIVE PO LIQD
237.0000 mL | Freq: Two times a day (BID) | ORAL | Status: DC
  Administered 2019-02-14 – 2019-02-15 (×3): 237 mL via ORAL

## 2019-02-12 MED ORDER — KETOROLAC TROMETHAMINE 15 MG/ML IJ SOLN
15.0000 mg | Freq: Four times a day (QID) | INTRAMUSCULAR | Status: AC
  Administered 2019-02-12 – 2019-02-13 (×3): 15 mg via INTRAVENOUS
  Filled 2019-02-12 (×4): qty 1

## 2019-02-12 MED ORDER — ENOXAPARIN SODIUM 40 MG/0.4ML ~~LOC~~ SOLN
40.0000 mg | SUBCUTANEOUS | Status: AC
  Administered 2019-02-12: 40 mg via SUBCUTANEOUS
  Filled 2019-02-12: qty 0.4

## 2019-02-12 MED ORDER — HYDROMORPHONE HCL 1 MG/ML IJ SOLN
INTRAMUSCULAR | Status: AC
  Filled 2019-02-12: qty 1

## 2019-02-12 MED ORDER — PHENYLEPHRINE 40 MCG/ML (10ML) SYRINGE FOR IV PUSH (FOR BLOOD PRESSURE SUPPORT)
PREFILLED_SYRINGE | INTRAVENOUS | Status: DC | PRN
  Administered 2019-02-12: 120 ug via INTRAVENOUS
  Administered 2019-02-12 (×3): 80 ug via INTRAVENOUS

## 2019-02-12 MED ORDER — ALPRAZOLAM 0.25 MG PO TABS: 0.1250 mg | ORAL_TABLET | Freq: Every evening | ORAL | Status: DC | PRN

## 2019-02-12 MED ORDER — EPHEDRINE SULFATE-NACL 50-0.9 MG/10ML-% IV SOSY
PREFILLED_SYRINGE | INTRAVENOUS | Status: DC | PRN
  Administered 2019-02-12: 10 mg via INTRAVENOUS
  Administered 2019-02-12: 7.5 mg via INTRAVENOUS

## 2019-02-12 MED ORDER — MIDAZOLAM HCL 5 MG/5ML IJ SOLN
INTRAMUSCULAR | Status: DC | PRN
  Administered 2019-02-12: 2 mg via INTRAVENOUS

## 2019-02-12 MED ORDER — BUPIVACAINE HCL (PF) 0.25 % IJ SOLN
INTRAMUSCULAR | Status: DC | PRN
  Administered 2019-02-12: 20 mL

## 2019-02-12 MED ORDER — ACETAMINOPHEN 500 MG PO TABS: 1000.0000 mg | ORAL_TABLET | Freq: Once | ORAL | Status: DC

## 2019-02-12 MED ORDER — PREGABALIN 75 MG PO CAPS
75.0000 mg | ORAL_CAPSULE | Freq: Two times a day (BID) | ORAL | Status: DC
  Administered 2019-02-13 – 2019-02-15 (×5): 75 mg via ORAL
  Filled 2019-02-12 (×5): qty 1

## 2019-02-12 MED ORDER — OXYCODONE HCL 5 MG PO TABS: 5.0000 mg | ORAL_TABLET | Freq: Once | ORAL | Status: DC | PRN

## 2019-02-12 MED ORDER — ROCURONIUM BROMIDE 10 MG/ML (PF) SYRINGE
PREFILLED_SYRINGE | INTRAVENOUS | Status: DC | PRN
  Administered 2019-02-12: 50 mg via INTRAVENOUS
  Administered 2019-02-12 (×2): 20 mg via INTRAVENOUS
  Administered 2019-02-12: 30 mg via INTRAVENOUS

## 2019-02-12 MED ORDER — ALBUMIN HUMAN 5 % IV SOLN
INTRAVENOUS | Status: AC
  Filled 2019-02-12: qty 250

## 2019-02-12 MED ORDER — ALVIMOPAN 12 MG PO CAPS
12.0000 mg | ORAL_CAPSULE | Freq: Once | ORAL | Status: AC
  Administered 2019-02-12: 10:00:00 12 mg via ORAL
  Filled 2019-02-12: qty 1

## 2019-02-12 MED ORDER — OXYCODONE HCL 5 MG/5ML PO SOLN: 5.0000 mg | Freq: Once | ORAL | Status: DC | PRN

## 2019-02-12 MED ORDER — SUGAMMADEX SODIUM 200 MG/2ML IV SOLN
INTRAVENOUS | Status: DC | PRN
  Administered 2019-02-12: 200 mg via INTRAVENOUS

## 2019-02-12 MED ORDER — BUPIVACAINE LIPOSOME 1.3 % IJ SUSP
20.0000 mL | Freq: Once | INTRAMUSCULAR | Status: AC
  Administered 2019-02-12: 17:00:00 20 mL
  Filled 2019-02-12: qty 20

## 2019-02-12 MED ORDER — LIDOCAINE 2% (20 MG/ML) 5 ML SYRINGE
INTRAMUSCULAR | Status: AC
  Filled 2019-02-12: qty 5

## 2019-02-12 MED ORDER — DEXAMETHASONE SODIUM PHOSPHATE 10 MG/ML IJ SOLN
INTRAMUSCULAR | Status: AC
  Filled 2019-02-12: qty 1

## 2019-02-12 MED ORDER — ENOXAPARIN SODIUM 40 MG/0.4ML ~~LOC~~ SOLN
40.0000 mg | SUBCUTANEOUS | Status: DC
  Administered 2019-02-13 – 2019-02-15 (×3): 40 mg via SUBCUTANEOUS
  Filled 2019-02-12 (×3): qty 0.4

## 2019-02-12 MED ORDER — ONDANSETRON HCL 4 MG/2ML IJ SOLN: 4.0000 mg | Freq: Four times a day (QID) | INTRAMUSCULAR | Status: DC | PRN

## 2019-02-12 MED ORDER — BUPIVACAINE HCL (PF) 0.25 % IJ SOLN
INTRAMUSCULAR | Status: AC
  Filled 2019-02-12: qty 30

## 2019-02-12 MED ORDER — 0.9 % SODIUM CHLORIDE (POUR BTL) OPTIME
TOPICAL | Status: DC | PRN
  Administered 2019-02-12: 15:00:00 3000 mL

## 2019-02-12 MED ORDER — SCOPOLAMINE 1 MG/3DAYS TD PT72
1.0000 | MEDICATED_PATCH | TRANSDERMAL | Status: DC
  Administered 2019-02-12: 10:00:00 1.5 mg via TRANSDERMAL
  Filled 2019-02-12: qty 1

## 2019-02-12 MED ORDER — HYDROMORPHONE HCL 1 MG/ML IJ SOLN
0.2500 mg | INTRAMUSCULAR | Status: DC | PRN
  Administered 2019-02-12 (×3): 0.25 mg via INTRAVENOUS
  Administered 2019-02-12 (×2): 0.5 mg via INTRAVENOUS
  Administered 2019-02-12: 18:00:00 0.25 mg via INTRAVENOUS

## 2019-02-12 MED ORDER — INDIGOTINDISULFONATE SODIUM 8 MG/ML IJ SOLN
INTRAMUSCULAR | Status: AC
  Filled 2019-02-12: qty 5

## 2019-02-12 MED ORDER — LACTATED RINGERS IV SOLN
INTRAVENOUS | Status: DC
  Administered 2019-02-12 (×3): via INTRAVENOUS

## 2019-02-12 MED ORDER — PROMETHAZINE HCL 25 MG/ML IJ SOLN: 6.2500 mg | INTRAMUSCULAR | Status: DC | PRN

## 2019-02-12 MED ORDER — KCL IN DEXTROSE-NACL 20-5-0.45 MEQ/L-%-% IV SOLN
INTRAVENOUS | Status: DC
  Administered 2019-02-12 – 2019-02-13 (×2): via INTRAVENOUS
  Filled 2019-02-12 (×2): qty 1000

## 2019-02-12 MED ORDER — DEXAMETHASONE SODIUM PHOSPHATE 4 MG/ML IJ SOLN: 4.0000 mg | INTRAMUSCULAR | Status: DC

## 2019-02-12 MED ORDER — ONDANSETRON HCL 4 MG/2ML IJ SOLN
INTRAMUSCULAR | Status: DC | PRN
  Administered 2019-02-12: 4 mg via INTRAVENOUS

## 2019-02-12 MED ORDER — GABAPENTIN 300 MG PO CAPS
300.0000 mg | ORAL_CAPSULE | ORAL | Status: AC
  Administered 2019-02-12: 300 mg via ORAL
  Filled 2019-02-12: qty 1

## 2019-02-12 MED ORDER — DEXAMETHASONE SODIUM PHOSPHATE 4 MG/ML IJ SOLN
INTRAMUSCULAR | Status: DC | PRN
  Administered 2019-02-12: 10 mg via INTRAVENOUS

## 2019-02-12 MED ORDER — KETOROLAC TROMETHAMINE 30 MG/ML IJ SOLN: 30.0000 mg | Freq: Once | INTRAMUSCULAR | Status: DC | PRN

## 2019-02-12 MED ORDER — ONDANSETRON HCL 4 MG/2ML IJ SOLN
INTRAMUSCULAR | Status: AC
  Filled 2019-02-12: qty 2

## 2019-02-12 MED ORDER — SODIUM CHLORIDE (PF) 0.9 % IJ SOLN
INTRAMUSCULAR | Status: DC | PRN
  Administered 2019-02-12: 20 mL

## 2019-02-12 MED ORDER — HYDROMORPHONE HCL 1 MG/ML IJ SOLN: 0.5000 mg | INTRAMUSCULAR | Status: DC | PRN

## 2019-02-12 MED ORDER — SODIUM CHLORIDE (PF) 0.9 % IJ SOLN
INTRAMUSCULAR | Status: AC
  Filled 2019-02-12: qty 50

## 2019-02-12 MED ORDER — ACETAMINOPHEN 500 MG PO TABS
1000.0000 mg | ORAL_TABLET | ORAL | Status: AC
  Administered 2019-02-12: 1000 mg via ORAL
  Filled 2019-02-12: qty 2

## 2019-02-12 MED ORDER — FENTANYL CITRATE (PF) 100 MCG/2ML IJ SOLN
INTRAMUSCULAR | Status: DC | PRN
  Administered 2019-02-12: 100 ug via INTRAVENOUS
  Administered 2019-02-12: 50 ug via INTRAVENOUS
  Administered 2019-02-12: 100 ug via INTRAVENOUS
  Administered 2019-02-12 (×2): 50 ug via INTRAVENOUS

## 2019-02-12 MED ORDER — IBUPROFEN 200 MG PO TABS
600.0000 mg | ORAL_TABLET | Freq: Four times a day (QID) | ORAL | Status: DC
  Administered 2019-02-13 – 2019-02-15 (×8): 600 mg via ORAL
  Filled 2019-02-12 (×8): qty 3

## 2019-02-12 MED ORDER — KETAMINE HCL 10 MG/ML IJ SOLN
INTRAMUSCULAR | Status: DC | PRN
  Administered 2019-02-12: 80 mg via INTRAVENOUS
  Administered 2019-02-12: 25 mg via INTRAVENOUS

## 2019-02-12 MED ORDER — ACETAMINOPHEN 500 MG PO TABS
1000.0000 mg | ORAL_TABLET | Freq: Four times a day (QID) | ORAL | Status: DC
  Administered 2019-02-12 – 2019-02-15 (×10): 1000 mg via ORAL
  Filled 2019-02-12 (×12): qty 2

## 2019-02-12 MED ORDER — PROPOFOL 10 MG/ML IV BOLUS
INTRAVENOUS | Status: AC
  Filled 2019-02-12: qty 20

## 2019-02-12 MED ORDER — NON FORMULARY: 1.0000 [IU] | Freq: Three times a day (TID) | Status: DC

## 2019-02-12 MED ORDER — PHENYLEPHRINE 40 MCG/ML (10ML) SYRINGE FOR IV PUSH (FOR BLOOD PRESSURE SUPPORT)
PREFILLED_SYRINGE | INTRAVENOUS | Status: AC
  Filled 2019-02-12: qty 10

## 2019-02-12 MED ORDER — PROPOFOL 10 MG/ML IV BOLUS
INTRAVENOUS | Status: DC | PRN
  Administered 2019-02-12: 120 mg via INTRAVENOUS

## 2019-02-12 MED ORDER — CHEWING GUM (ORBIT) SUGAR FREE
1.0000 | CHEWING_GUM | Freq: Three times a day (TID) | ORAL | Status: DC
  Administered 2019-02-13 – 2019-02-15 (×8): 1 via ORAL
  Filled 2019-02-12 (×2): qty 1

## 2019-02-12 MED ORDER — MIDAZOLAM HCL 2 MG/2ML IJ SOLN
INTRAMUSCULAR | Status: AC
  Filled 2019-02-12: qty 2

## 2019-02-12 MED ORDER — INDIGOTINDISULFONATE SODIUM 8 MG/ML IJ SOLN
INTRAMUSCULAR | Status: DC | PRN
  Administered 2019-02-12: 5 mL via INTRAVENOUS

## 2019-02-12 MED ORDER — ONDANSETRON HCL 4 MG PO TABS: 4.0000 mg | ORAL_TABLET | Freq: Four times a day (QID) | ORAL | Status: DC | PRN

## 2019-02-12 MED ORDER — ENOXAPARIN (LOVENOX) PATIENT EDUCATION KIT
PACK | Freq: Once | Status: AC
  Administered 2019-02-13: 10:00:00
  Filled 2019-02-12: qty 1

## 2019-02-12 MED ORDER — FENTANYL CITRATE (PF) 250 MCG/5ML IJ SOLN
INTRAMUSCULAR | Status: AC
  Filled 2019-02-12: qty 5

## 2019-02-12 SURGICAL SUPPLY — 72 items
ADH SKN CLS APL DERMABOND .7 (GAUZE/BANDAGES/DRESSINGS)
AGENT HMST KT MTR STRL THRMB (HEMOSTASIS) ×4
APL PRP STRL LF DISP 70% ISPRP (MISCELLANEOUS) ×2
ATTRACTOMAT 16X20 MAGNETIC DRP (DRAPES) ×1 IMPLANT
BLADE EXTENDED COATED 6.5IN (ELECTRODE) ×3 IMPLANT
CELLS DAT CNTRL 66122 CELL SVR (MISCELLANEOUS) IMPLANT
CHLORAPREP W/TINT 26 (MISCELLANEOUS) ×3 IMPLANT
CLIP VESOCCLUDE LG 6/CT (CLIP) ×3 IMPLANT
CLIP VESOCCLUDE MED 6/CT (CLIP) ×4 IMPLANT
CLIP VESOCCLUDE MED LG 6/CT (CLIP) ×5 IMPLANT
CONT SPEC 4OZ CLIKSEAL STRL BL (MISCELLANEOUS) IMPLANT
COVER WAND RF STERILE (DRAPES) IMPLANT
DERMABOND ADVANCED (GAUZE/BANDAGES/DRESSINGS)
DERMABOND ADVANCED .7 DNX12 (GAUZE/BANDAGES/DRESSINGS) IMPLANT
DRAIN CHANNEL RND F F (WOUND CARE) ×1 IMPLANT
DRAPE WARM FLUID 44X44 (DRAPES) ×3 IMPLANT
DRSG OPSITE POSTOP 4X10 (GAUZE/BANDAGES/DRESSINGS) IMPLANT
DRSG OPSITE POSTOP 4X12 (GAUZE/BANDAGES/DRESSINGS) ×1 IMPLANT
DRSG OPSITE POSTOP 4X6 (GAUZE/BANDAGES/DRESSINGS) IMPLANT
DRSG OPSITE POSTOP 4X8 (GAUZE/BANDAGES/DRESSINGS) IMPLANT
ELECT REM PT RETURN 15FT ADLT (MISCELLANEOUS) ×3 IMPLANT
EVACUATOR SILICONE 100CC (DRAIN) ×1 IMPLANT
GAUZE 4X4 16PLY RFD (DISPOSABLE) IMPLANT
GLOVE BIO SURGEON STRL SZ 6 (GLOVE) ×8 IMPLANT
GLOVE BIO SURGEON STRL SZ 6.5 (GLOVE) ×6 IMPLANT
GOWN STRL REUS W/ TWL LRG LVL3 (GOWN DISPOSABLE) ×4 IMPLANT
GOWN STRL REUS W/TWL LRG LVL3 (GOWN DISPOSABLE) ×12
HEMOSTAT ARISTA ABSORB 3G PWDR (HEMOSTASIS) IMPLANT
KIT BASIN OR (CUSTOM PROCEDURE TRAY) ×3 IMPLANT
KIT TURNOVER KIT A (KITS) IMPLANT
LIGASURE IMPACT 36 18CM CVD LR (INSTRUMENTS) ×1 IMPLANT
LOOP VESSEL MAXI BLUE (MISCELLANEOUS) IMPLANT
NEEDLE HYPO 22GX1.5 SAFETY (NEEDLE) ×6 IMPLANT
PACK GENERAL/GYN (CUSTOM PROCEDURE TRAY) ×3 IMPLANT
RELOAD PROXIMATE 75MM BLUE (ENDOMECHANICALS) IMPLANT
RELOAD PROXIMATE TA60MM BLUE (ENDOMECHANICALS) IMPLANT
RELOAD STAPLE 60 BLU REG PROX (ENDOMECHANICALS) IMPLANT
RELOAD STAPLE 75 3.8 BLU REG (ENDOMECHANICALS) IMPLANT
RETRACTOR WND ALEXIS 18 MED (MISCELLANEOUS) IMPLANT
RETRACTOR WND ALEXIS 25 LRG (MISCELLANEOUS) IMPLANT
RTRCTR WOUND ALEXIS 18CM MED (MISCELLANEOUS)
RTRCTR WOUND ALEXIS 25CM LRG (MISCELLANEOUS)
SHEET LAVH (DRAPES) ×3 IMPLANT
SPONGE LAP 18X18 RF (DISPOSABLE) ×3 IMPLANT
SPONGE SURGIFOAM ABS GEL 100 (HEMOSTASIS) ×1 IMPLANT
STAPLER GUN LINEAR PROX 60 (STAPLE) IMPLANT
STAPLER PROXIMATE 75MM BLUE (STAPLE) IMPLANT
STAPLER VISISTAT 35W (STAPLE) ×1 IMPLANT
SURGIFLO W/THROMBIN 8M KIT (HEMOSTASIS) ×2 IMPLANT
SUT ETHILON 3 0 PS 1 (SUTURE) ×1 IMPLANT
SUT MNCRL AB 4-0 PS2 18 (SUTURE) ×6 IMPLANT
SUT PDS AB 1 TP1 96 (SUTURE) ×6 IMPLANT
SUT SILK 3 0 SH CR/8 (SUTURE) IMPLANT
SUT VIC AB 0 CT1 36 (SUTURE) ×12 IMPLANT
SUT VIC AB 2-0 CT1 36 (SUTURE) ×6 IMPLANT
SUT VIC AB 2-0 CT2 27 (SUTURE) ×21 IMPLANT
SUT VIC AB 2-0 SH 27 (SUTURE)
SUT VIC AB 2-0 SH 27X BRD (SUTURE) IMPLANT
SUT VIC AB 3-0 CT1 27 (SUTURE) ×33
SUT VIC AB 3-0 CT1 36 (SUTURE) ×3 IMPLANT
SUT VIC AB 3-0 CT1 TAPERPNT 27 (SUTURE) IMPLANT
SUT VIC AB 3-0 CTX 36 (SUTURE) IMPLANT
SUT VIC AB 3-0 SH 18 (SUTURE) IMPLANT
SUT VIC AB 3-0 SH 27 (SUTURE) ×21
SUT VIC AB 3-0 SH 27X BRD (SUTURE) ×2 IMPLANT
SUT VIC AB 4-0 SH 27 (SUTURE) ×6
SUT VIC AB 4-0 SH 27XBRD (SUTURE) IMPLANT
SYR 30ML LL (SYRINGE) ×6 IMPLANT
TOWEL OR 17X26 10 PK STRL BLUE (TOWEL DISPOSABLE) ×3 IMPLANT
TOWEL OR NON WOVEN STRL DISP B (DISPOSABLE) ×3 IMPLANT
TRAY FOLEY MTR SLVR 16FR STAT (SET/KITS/TRAYS/PACK) ×3 IMPLANT
UNDERPAD 30X36 HEAVY ABSORB (UNDERPADS AND DIAPERS) ×3 IMPLANT

## 2019-02-12 NOTE — Interval H&P Note (Signed)
History and Physical Interval Note:  02/12/2019 1:05 PM  Stacy Rivas  has presented today for surgery, with the diagnosis of OVARIAN CANCER.  The various methods of treatment have been discussed with the patient and family. After consideration of risks, benefits and other options for treatment, the patient has consented to  Procedure(s): HYSTERECTOMY ABDOMINAL WITH SALPINGO-OOPHORECTOMY (Bilateral) OMENTECTOMY (N/A) DEBULKING OF TUMOR,POSSIBLE BOWEL RESECTION (N/A) as a surgical intervention.  The patient's history has been reviewed, patient examined, no change in status, stable for surgery.  I have reviewed the patient's chart and labs.  Questions were answered to the patient's satisfaction.     Thereasa Solo

## 2019-02-12 NOTE — Transfer of Care (Signed)
Immediate Anesthesia Transfer of Care Note  Patient: Stacy Rivas  Procedure(s) Performed: HYSTERECTOMY ABDOMINAL WITH SALPINGO-OOPHORECTOMY (Bilateral ) OMENTECTOMY (N/A ) DEBULKING OF TUMOR (N/A )  Patient Location: PACU  Anesthesia Type:General  Level of Consciousness: awake and patient cooperative  Airway & Oxygen Therapy: Patient Spontanous Breathing and Patient connected to face mask  Post-op Assessment: Report given to RN and Post -op Vital signs reviewed and stable  Post vital signs: Reviewed and stable  Last Vitals:  Vitals Value Taken Time  BP    Temp    Pulse    Resp    SpO2      Last Pain:  Vitals:   02/12/19 0923  TempSrc: Oral         Complications: No apparent anesthesia complications

## 2019-02-12 NOTE — Progress Notes (Signed)
Patient arrived from PACU. Awake and alert. [place] with scant amount bloody drainage to lower area. Donne Hazel, RN

## 2019-02-12 NOTE — Anesthesia Procedure Notes (Signed)
Procedure Name: Intubation Date/Time: 02/12/2019 2:05 PM Performed by: Claudia Desanctis, CRNA Pre-anesthesia Checklist: Patient identified, Emergency Drugs available, Suction available and Patient being monitored Patient Re-evaluated:Patient Re-evaluated prior to induction Oxygen Delivery Method: Circle system utilized Preoxygenation: Pre-oxygenation with 100% oxygen Induction Type: IV induction Ventilation: Mask ventilation without difficulty Laryngoscope Size: 2 and Miller Grade View: Grade I Tube type: Oral Tube size: 7.0 mm Number of attempts: 1 Airway Equipment and Method: Stylet Placement Confirmation: ETT inserted through vocal cords under direct vision,  positive ETCO2 and breath sounds checked- equal and bilateral Secured at: 21 cm Tube secured with: Tape Dental Injury: Teeth and Oropharynx as per pre-operative assessment

## 2019-02-12 NOTE — Op Note (Signed)
OPERATIVE NOTE  02/12/19 Preoperative Diagnosis: Stage IIIC ovarian cancer, s/p neoadjuvant chemotherapy   Postoperative Diagnosis: same    Procedure(s) Performed: 1. Exploratory laparotomy with total abdominal hysterectomy, bilateral salpingo-oophorectomy, omentectomy radical tumor debulking for ovarian cancer.  Surgeon: Thereasa Solo, MD.  Assistant Surgeon: Lahoma Crocker, M.D. Assistant: (an MD assistant was necessary for tissue manipulation, retraction and positioning due to the complexity of the case and hospital policies).   Specimens: Bilateral tubes / ovaries, omentum.    Estimated Blood Loss: 500 mL.    Urine 0000000  Complications: None.   Operative Findings: upper abdomen free of disease. No visible omental disease. Small volume (200cc) ascites. 8cm friable mass replacing left ovary and adherent to the sigmoid colon mesentery and ureter on the left.  Anterior fibroid.  This represented an optimal cytoreduction (R0) with no gross visible disease remaining.   Procedure:   The patient was seen in the Holding Room. The risks, benefits, complications, treatment options, and expected outcomes were discussed with the patient.  The patient concurred with the proposed plan, giving informed consent.   The patient was  identified as Stacy Rivas  and the procedure verified as TAHBSO, omentectomy, tumor debulking. A Time Out was held and the above information confirmed upon entry to the operating room..  After induction of anesthesia, the patient was draped and prepped in the usual sterile manner.  She was prepped and draped in the normal sterile fashion in the dorsal lithotomy position in padded Allen stirrups with good attention paid to support of the lower back and lower extremities. Position was adjusted for appropriate support. A Foley catheter was placed to gravity.   A right para-median incision was made and carried through the subcutaneous tissue to the fascia. The  fascial incision was made and extended superiorally. The rectus muscles were separated. The peritoneum was identified and entered. Peritoneal incision was extended longitudinally.  The abdominal cavity was entered sharply and without incident. A Bookwalter retractor was then placed. A survey of the abdomen and pelvis revealed the above findings, which were significant for no residual macroscopic disease in the upper abdomen, but a large friable left ovarian mass that was densely adherent to the left pelvic sidewall and sigmoid colon mesenter (not adherent to the bowel wall).  The omentum was dissected free from the transverse colon from the hepatic flexure to the splenic flexure using sharp metzenbaum scissor dissection. The lesser sac was entered. The omentum was separated from the mesentery of the transverse colon. The short gastric vessels were sealed with ligasure and the infragastric omentum was separated from the greater curvature of the stomach removing all omentum. Hemostasis was confirmed. The colon was closely inspected and was noted to be intact and hemostatic.   After packing the small bowel into the upper abdomen, we approached there right side of the hysterectomy and salpingo-oophorectomy by entering the  pelvic sidewall just posterior to the right round ligament. The pararectal space was developed and the retroperitoneum developed up to the level of the common iliac artery.  The course of the ureter was identified with ease. The right IP was then skeletonized, and sealed and cut with the ligasure. The ovary was separated from its peritoneal attachments with the bovie with visualization of the ureter at all times. The bladder flap was developed with the Bovie to separate it from the anterior attachments to the uterus.  Some bleeding was encountered with capillaries on the surface of the bladder at the trigone area which  were made hemostatic with 3-0 Vicryl suture and clips.  The bladder was  mobilized inferior to the level of the cervicovaginal junction.  The sigmoid colon was dissected from its dense tumor attachments to the left ovary using sharp dissection. The left retroperitoneal peritoneum was entered parallel to the sigmoid colon attachments and the left ureter was identified in the left retroperitoneal space. It was intimately adherent to the tumor mass and therefore it was skeletonized, marked with a vessel loop, and freed from its retroperitoneal attachments using the Bovie.  As the ureter was mobilized at the level of the pelvic brim there was discharge of the Bovie briefly on the medial wall of the left ureter.  The ureter itself appeared intact with no charring.  Methylene blue was administered and there was no extravasation from the ureter of methylene blue.  2 interrupted 4-0 Vicryl sutures passed perpendicular to the line of the ureter were made to imbricate the mesial ureter to imbricate the area of bovie cautery without narrowing kinking or closing off the ureter. The on call urologist, Dr Tresa Moore, was consulted verbally and we were advised to leave in a drain at the end of the case to monitor for urine leak.   The left IP ligament was sealed with ligasure and transected with care to observe the course of the dissected, mobilized ureter.   There was significant tumor bulk on the left and therefore using sharp and monopolar dissection, the left tube and ovary were freed from their peritoneal adhesions to the pelvis and sigmoid colon and amputated from its attachment to the uterus.  It was then sent as a separate specimen.  The left side of the bladder flap and uterine vessels in the left were skeletonized.  The left uterine artery was identified at its origin double clipped and transected to facilitate mobilization of the ureter away from the left pelvic sidewall in the posterior cul-de-sac where the ureter was intimately associated with the infiltrating tumor.  In doing so this  enabled skeletonization of the uterine isthmus on the left.  The uterine vessels were clamped bilaterally at the uterine isthmus with curved Heaney clamps then the pedicles were transected and suture ligated. Successive passes of straight Heaney clamps were used down the cardinal ligaments bilaterally with each pass medial to the prior. The pedicles were sharply transected and made hemostatic with suture. The bladder was ensured to be below the cervicovaginal junction, then curved clamps were passed across the cervicovaginal junction and the uterus was sharply transected. The vaginal cuff was closed with 0-vicryl on interrupted figure of 8 suture.  We then proceeded to debulk the tumor rind from the sigmoid colon mesentery using Bovie and sharp dissection to remove all gross residual disease from this region.  After the sidewall and sigmoid colon mesentery had been debulked of all gross visible tumor hemostasis was achieved with interrupted 3-0 Vicryl sutures, hemoclips, and the argon beam coagulator.  There was bleeding encountered in the left pararectal deep fossa approximating the sacrum and the internal iliac veins.  This was made hemostatic with suture and Gelfoam.  A plate piece of Gelfoam was then placed overlying the sigmoid colon mesentery on the left to reinforce hemostasis at the tumor bed.  The peritoneal cavity was irrigated and hemostasis was confirmed at all surgical sites.  A drain site was made in the left abdomen with the scalpel and Anderson clamp was used to pass a #10 JP drain through this site.  It rested behind  the mesentery of the sigmoid colon and overlying the left ureter into the posterior cul-de-sac.  It was fixed to the skin with a 3-0 nylon.  No gross residual tumor was present at the completion of the case.   The fascia was reapproximated with 0 looped PDS using a total of two sutures. The subcutaneous layer was then irrigated copiously.  Exparel long acting local  anesthetic was infiltrated into the subcutaneous tissues. The skin was closed with staples. The patient tolerated the procedure well.   Sponge, lap and needle counts were correct x 2.    Thereasa Solo, MD

## 2019-02-13 ENCOUNTER — Encounter (HOSPITAL_COMMUNITY): Payer: Self-pay | Admitting: Gynecologic Oncology

## 2019-02-13 ENCOUNTER — Other Ambulatory Visit: Payer: Self-pay

## 2019-02-13 LAB — CBC
HCT: 27.5 % — ABNORMAL LOW (ref 36.0–46.0)
Hemoglobin: 8.8 g/dL — ABNORMAL LOW (ref 12.0–15.0)
MCH: 30.1 pg (ref 26.0–34.0)
MCHC: 32 g/dL (ref 30.0–36.0)
MCV: 94.2 fL (ref 80.0–100.0)
Platelets: 203 10*3/uL (ref 150–400)
RBC: 2.92 MIL/uL — ABNORMAL LOW (ref 3.87–5.11)
RDW: 22 % — ABNORMAL HIGH (ref 11.5–15.5)
WBC: 12.1 10*3/uL — ABNORMAL HIGH (ref 4.0–10.5)
nRBC: 0 % (ref 0.0–0.2)

## 2019-02-13 LAB — BASIC METABOLIC PANEL
Anion gap: 8 (ref 5–15)
BUN: 9 mg/dL (ref 8–23)
CO2: 22 mmol/L (ref 22–32)
Calcium: 7.8 mg/dL — ABNORMAL LOW (ref 8.9–10.3)
Chloride: 106 mmol/L (ref 98–111)
Creatinine, Ser: 0.43 mg/dL — ABNORMAL LOW (ref 0.44–1.00)
GFR calc Af Amer: >60 mL/min (ref >60)
GFR calc non Af Amer: >60 mL/min (ref >60)
Glucose, Bld: 191 mg/dL — ABNORMAL HIGH (ref 70–99)
Potassium: 3.8 mmol/L (ref 3.5–5.1)
Sodium: 136 mmol/L (ref 135–145)

## 2019-02-13 NOTE — Progress Notes (Signed)
1 Day Post-Op Procedure(s) (LRB): HYSTERECTOMY ABDOMINAL WITH SALPINGO-OOPHORECTOMY (Bilateral) OMENTECTOMY (N/A) DEBULKING OF TUMOR (N/A)  Subjective: Patient reports ambulating to the bathroom and in the hall without difficulty. Voiding adequate amounts. Nausea reported yesterday but none this am. No emesis.  Has tolerated graham crackers and has solid breakfast tray in the room.  No flatus or BM reported.  Denies chest pain, dyspnea.  All questions answered. No concerns or needs voiced.  Objective: Vital signs in last 24 hours: Temp:  [97.5 F (36.4 C)-98.6 F (37 C)] 98.6 F (37 C) (09/23 0612) Pulse Rate:  [63-72] 72 (09/23 0612) Resp:  [10-22] 16 (09/23 0612) BP: (79-139)/(50-97) 114/75 (09/23 0612) SpO2:  [90 %-100 %] 97 % (09/23 0612) Last BM Date: 02/12/19  Intake/Output from previous day: 09/22 0701 - 09/23 0700 In: 4723.3 [P.O.:540; I.V.:3733.3; IV Piggyback:450] Out: 3250 [Urine:2400; Drains:350; Blood:500]  Physical Examination: General: alert, cooperative, appears stated age and no distress Resp: clear to auscultation bilaterally Cardio: regular rate and rhythm, S1, S2 normal, no murmur, click, rub or gallop GI: incision: midline incision with op site dressing in place with no active drainage noted underneath and abdomen soft, non-distended, active bowel sounds, JP drain with serosanguinous drainage Extremities: extremities normal, atraumatic, no cyanosis or edema  Labs: WBC/Hgb/Hct/Plts:  12.1/8.8/27.5/203 (09/23 0316) BUN/Cr/glu/ALT/AST/amyl/lip:  9/0.43/--/--/--/--/-- (09/23 0316)  Assessment: 62 y.o. s/p Procedure(s): HYSTERECTOMY ABDOMINAL WITH SALPINGO-OOPHORECTOMY OMENTECTOMY DEBULKING OF TUMOR: stable Pain:  Pain is well-controlled on PRN medications.  Heme: Hgb 8.8 and Hct 27.5 this am. Was 11.5 and Hct 36.1 pre-op. 500 mL with EBL.   CV: BP and HR stable.  Continue to monitor with ordered vital signs.   GI:  Tolerating po: Yes. Regular diet ordered.        GU: Voiding. Adequate urine outpt recorded. Creatinine 0.43 this am.    FEN: No critical values this am.  Prophylaxis: pharmacologic prophylaxis (with any of the following: enoxaparin (Lovenox) 40mg SQ 2 hours prior to surgery then every day) and intermittent pneumatic compression boots.  Plan: Diet to regular Lovenox teaching kit for home use Kpad If diet tolerated and adequate oral intake, plan for saline lock around lunch time Encourage ambulation, IS use, deep breathing and coughing Pt to be discharged with JP drain   LOS: 1 day    Stacy Rivas 02/13/2019, 10:15 AM       

## 2019-02-13 NOTE — Progress Notes (Signed)
Patient resting comfortably in bed.  States she was just in the chair then ambulated to the bathroom without difficulty. States she tolerated breakfast and lunch. Intermittent burping. No flatus. Voiding without difficulty. No concerns voiced. IV saline locked.

## 2019-02-14 LAB — BASIC METABOLIC PANEL
Anion gap: 6 (ref 5–15)
BUN: 8 mg/dL (ref 8–23)
CO2: 25 mmol/L (ref 22–32)
Calcium: 8.3 mg/dL — ABNORMAL LOW (ref 8.9–10.3)
Chloride: 111 mmol/L (ref 98–111)
Creatinine, Ser: 0.48 mg/dL (ref 0.44–1.00)
GFR calc Af Amer: >60 mL/min (ref >60)
GFR calc non Af Amer: >60 mL/min (ref >60)
Glucose, Bld: 100 mg/dL — ABNORMAL HIGH (ref 70–99)
Potassium: 3.5 mmol/L (ref 3.5–5.1)
Sodium: 142 mmol/L (ref 135–145)

## 2019-02-14 LAB — CBC
HCT: 24.7 % — ABNORMAL LOW (ref 36.0–46.0)
Hemoglobin: 7.8 g/dL — ABNORMAL LOW (ref 12.0–15.0)
MCH: 30.2 pg (ref 26.0–34.0)
MCHC: 31.6 g/dL (ref 30.0–36.0)
MCV: 95.7 fL (ref 80.0–100.0)
Platelets: 196 10*3/uL (ref 150–400)
RBC: 2.58 MIL/uL — ABNORMAL LOW (ref 3.87–5.11)
RDW: 22.7 % — ABNORMAL HIGH (ref 11.5–15.5)
WBC: 9.1 10*3/uL (ref 4.0–10.5)
nRBC: 0 % (ref 0.0–0.2)

## 2019-02-14 MED ORDER — FERROUS GLUCONATE 324 (38 FE) MG PO TABS
324.0000 mg | ORAL_TABLET | Freq: Two times a day (BID) | ORAL | Status: DC
  Administered 2019-02-14 – 2019-02-15 (×3): 324 mg via ORAL
  Filled 2019-02-14 (×4): qty 1

## 2019-02-14 MED ORDER — CALCIUM CARBONATE ANTACID 500 MG PO CHEW: 2.0000 | CHEWABLE_TABLET | Freq: Two times a day (BID) | ORAL | Status: DC | PRN

## 2019-02-14 MED ORDER — PANTOPRAZOLE SODIUM 40 MG PO TBEC
40.0000 mg | DELAYED_RELEASE_TABLET | Freq: Every day | ORAL | Status: DC
  Administered 2019-02-14 – 2019-02-15 (×2): 40 mg via ORAL
  Filled 2019-02-14 (×2): qty 1

## 2019-02-14 NOTE — Anesthesia Postprocedure Evaluation (Signed)
Anesthesia Post Note  Patient: JANYRA MASTRANGELO  Procedure(s) Performed: HYSTERECTOMY ABDOMINAL WITH SALPINGO-OOPHORECTOMY (Bilateral ) OMENTECTOMY (N/A ) DEBULKING OF TUMOR (N/A )     Patient location during evaluation: PACU Anesthesia Type: General Level of consciousness: awake and alert Pain management: pain level controlled Vital Signs Assessment: post-procedure vital signs reviewed and stable Respiratory status: spontaneous breathing, nonlabored ventilation, respiratory function stable and patient connected to nasal cannula oxygen Cardiovascular status: blood pressure returned to baseline and stable Postop Assessment: no apparent nausea or vomiting Anesthetic complications: no    Last Vitals:  Vitals:   02/13/19 2215 02/14/19 0644  BP: (!) 105/49 (!) 112/57  Pulse: 81 73  Resp: 16 14  Temp: 37.1 C 36.8 C  SpO2: 97% 96%    Last Pain:  Vitals:   02/14/19 0644  TempSrc: Oral  PainSc:                  Montez Hageman

## 2019-02-14 NOTE — Progress Notes (Signed)
2 Days Post-Op Procedure(s) (LRB): HYSTERECTOMY ABDOMINAL WITH SALPINGO-OOPHORECTOMY (Bilateral) OMENTECTOMY (N/A) DEBULKING OF TUMOR (N/A)  Subjective: Patient reports ambulating to the bathroom and in the hall without difficulty. Voiding adequate amounts. No nausea or emesis.  No flatus or BM reported.  Denies chest pain, dyspnea, dizziness.  All questions answered. No concerns or needs voiced.  Objective: Vital signs in last 24 hours: Temp:  [98.3 F (36.8 C)-98.8 F (37.1 C)] 98.3 F (36.8 C) (09/24 0644) Pulse Rate:  [73-81] 73 (09/24 0644) Resp:  [14-16] 14 (09/24 0644) BP: (100-112)/(49-66) 112/57 (09/24 0644) SpO2:  [96 %-100 %] 96 % (09/24 0644) Last BM Date: 02/11/19  Intake/Output from previous day: 09/23 0701 - 09/24 0700 In: 79 [P.O.:840] Out: 3290 [Urine:3150; Drains:140]  Physical Examination: General: alert, cooperative, appears stated age and no distress Resp: clear to auscultation bilaterally Cardio: regular rate and rhythm, S1, S2 normal, no murmur, click, rub or gallop GI: incision: midline incision with op site dressing in place with no active drainage noted underneath and abdomen soft, non-distended, active bowel sounds, JP drain with serosanguinous drainage Extremities: extremities normal, atraumatic, no cyanosis or edema  Labs: WBC/Hgb/Hct/Plts:  9.1/7.8/24.7/196 (09/24 0409) BUN/Cr/glu/ALT/AST/amyl/lip:  8/0.48/--/--/--/--/-- (09/24 0409)  Assessment: 63 y.o. s/p Procedure(s): HYSTERECTOMY ABDOMINAL WITH SALPINGO-OOPHORECTOMY OMENTECTOMY DEBULKING OF TUMOR: stable Pain:  Pain is well-controlled on PRN medications.  Heme: Hgb 7.8 and Hct 24.7 this am. Anemia related to acute blood loss from surgery-no active bleeding. Was 11.5 and Hct 36.1 pre-op. 500 mL with EBL. No need for transfusion at this time.   CV: BP and HR stable.  Continue to monitor with ordered vital signs.   GI:  Tolerating po: Yes. Regular diet ordered.       GU: Voiding. Adequate  urine outpt recorded. Creatinine 0.48 this am.    FEN: No critical values this am.  Prophylaxis: pharmacologic prophylaxis (with any of the following: enoxaparin (Lovenox) 67m SQ 2 hours prior to surgery then every day) and intermittent pneumatic compression boots.  Plan: Protonix ordered per pt request Begin iron supplementation JP teaching Lovenox teaching kit for home use Kpad Encourage ambulation, IS use, deep breathing and coughing Pt to be discharged with JP drain Continue plan of care per Dr. RDenman George  LOS: 2 days    MDorothyann Gibbs9/24/2020, 8:44 AM

## 2019-02-15 ENCOUNTER — Telehealth: Payer: Self-pay

## 2019-02-15 ENCOUNTER — Other Ambulatory Visit: Payer: Self-pay | Admitting: Gynecologic Oncology

## 2019-02-15 DIAGNOSIS — C569 Malignant neoplasm of unspecified ovary: Secondary | ICD-10-CM

## 2019-02-15 MED ORDER — ENOXAPARIN SODIUM 40 MG/0.4ML ~~LOC~~ SOLN: 40.0000 mg | SUBCUTANEOUS | 0 refills | Status: DC

## 2019-02-15 MED ORDER — FERROUS GLUCONATE 324 (38 FE) MG PO TABS: 324.0000 mg | ORAL_TABLET | Freq: Three times a day (TID) | ORAL | 1 refills | Status: DC

## 2019-02-15 MED FILL — ENOXAPARIN SODIUM 40 MG/0.4: 40 | 11 days supply | Qty: 4 | Fill #0

## 2019-02-15 MED FILL — FERROUS GLUCONATE 324 MG TA: 324 (38 FE) | 33 days supply | Qty: 100 | Fill #0

## 2019-02-15 NOTE — Discharge Instructions (Addendum)
02/15/2019  Return to work: 4-6 weeks if applicable  You will need to come in to the [facility] the day before your appt with Lenna Sciara NP so our nurse Santiago Glad can take a sample of the JP drain fluid. We will send that to the lab to test the fluid for creatinine. This is excreted from your kidneys and we want to make sure that the JP fluid has a normal value for this suggesting that there is no leaking of urine from the ureter. If elevated it means the drain would need to stay in longer to continue to let the ureter heal.  REMOVE THE WHITE HONEYCOMB DRESSING FROM YOUR INCISION ON Sunday AND YOU DO NOT NEED TO REAPPLY A DRESSING. KEEP THE DRESSING AROUND THE JP SITE CLEAN AND DRY.  Activity: 1. Be up and out of the bed during the day.  Take a nap if needed.  You may walk up steps but be careful and use the hand rail.  Stair climbing will tire you more than you think, you may need to stop part way and rest.   2. No lifting or straining for 6 weeks.  3. No driving for 2 week(s).  Do not drive if you are taking narcotic pain medicine.  4. Shower daily.  Use soap and water on your incision and pat dry; don't rub.  No tub baths until cleared by your surgeon.   5. No sexual activity and nothing in the vagina for 6 weeks.  6. You may experience a small amount of clear drainage from your incisions, which is normal.  If the drainage persists or increases, please call the office.  7. You may experience vaginal spotting after surgery or around the 6-8 week mark from surgery when the stitches at the top of the vagina begin to dissolve.  The spotting is normal but if you experience heavy bleeding, call our office.  8. Take Tylenol or ibuprofen first for pain and only use oxycodone for severe pain not relieved by the Tylenol or Ibuprofen.  Monitor your Tylenol intake to a max of 4,000 mg a day.  Diet: 1. Low sodium Heart Healthy Diet is recommended.  2. It is safe to use a laxative, such as Miralax or  Colace, if you have difficulty moving your bowels. You can take Sennakot at bedtime every evening to keep bowel movements regular and to prevent constipation.    Wound Care: 1. Keep clean and dry.  Shower daily.  Reasons to call the Doctor:  Fever - Oral temperature greater than 100.4 degrees Fahrenheit  Foul-smelling vaginal discharge  Difficulty urinating  Nausea and vomiting  Increased pain at the site of the incision that is unrelieved with pain medicine.  Difficulty breathing with or without chest pain  New calf pain especially if only on one side  Sudden, continuing increased vaginal bleeding with or without clots.   Contacts: For questions or concerns you should contact:  Dr. Everitt Amber at [phone]  Joylene John, NP at [phone]  After Hours: call [phone] and have the GYN Oncologist paged/contacted   Surgical Drain Record  Empty your surgical drain as told by your health care provider. Use this form to write down the amount of fluid that has collected in the drainage container. Bring this form with you to your follow-up visits. Surgical drain #1 location: ___________________  Date __________ Time __________ Amount __________ Date __________ Time __________ Amount __________ Date __________ Time __________ Amount __________ Date __________ Time __________ Amount  __________ Date __________ Time __________ Amount __________ Date __________ Time __________ Amount __________ Date __________ Time __________ Amount __________ Date __________ Time __________ Amount __________ Date __________ Time __________ Amount __________ Date __________ Time __________ Amount __________ Date __________ Time __________ Amount __________ Date __________ Time __________ Amount __________ Date __________ Time __________ Amount __________ Date __________ Time __________ Amount __________ Date __________ Time __________ Amount __________ Date __________ Time __________  Amount __________ Date __________ Time __________ Amount __________ Date __________ Time __________ Amount __________ Date __________ Time __________ Amount __________ Date __________ Time __________ Amount __________ Date __________ Time __________ Amount __________ Surgical drain #2 location: ___________________ Date __________ Time __________ Amount __________ Date __________ Time __________ Amount __________ Date __________ Time __________ Amount __________ Date __________ Time __________ Amount __________ Date __________ Time __________ Amount __________ Date __________ Time __________ Amount __________ Date __________ Time __________ Amount __________ Date __________ Time __________ Amount __________ Date __________ Time __________ Amount __________ Date __________ Time __________ Amount __________ Date __________ Time __________ Amount __________ Date __________ Time __________ Amount __________ Date __________ Time __________ Amount __________ Date __________ Time __________ Amount __________ Date __________ Time __________ Amount __________ Date __________ Time __________ Amount __________ Date __________ Time __________ Amount __________ Date __________ Time __________ Amount __________ Date __________ Time __________ Amount __________ Date __________ Time __________ Amount __________ Date __________ Time __________ Amount __________ This information is not intended to replace advice given to you by your health care provider. Make sure you discuss any questions you have with your health care provider. Document Released: 02/13/2017 Document Revised: 02/13/2017 Document Reviewed: 02/13/2017 Elsevier Patient Education  2020 [place] Surgical drains are used to remove extra fluid that normally builds up in a surgical wound after surgery. A surgical drain helps to heal a surgical wound. Different kinds of surgical drains include:  Active drains.  These drains use suction to pull drainage away from the surgical wound. Drainage flows through a tube to a container outside of the body. With these drains, you need to keep the bulb or the drainage container flat (compressed) at all times, except while you empty it. Flattening the bulb or container creates suction.  Passive drains. These drains allow fluid to drain naturally, by gravity. Drainage flows through a tube to a bandage (dressing) or a container outside of the body. Passive drains do not need to be emptied. A drain is placed during surgery. Right after surgery, drainage is usually bright red and a little thicker than water. The drainage may gradually turn yellow or pink and become thinner. It is likely that your health care provider will remove the drain when the drainage stops or when the amount decreases to 1-2 Tbsp (15-30 mL) during a 24-hour period. Supplies needed:  Tape.  Germ-free cleaning solution (sterile saline).  Cotton swabs.  Split gauze drain sponge: 4 x 4 inches (10 x 10 cm).  Gauze square: 4 x 4 inches (10 x 10 cm). How to care for your surgical drain Care for your drain as told by your health care provider. This is important to help prevent infection. If your drain is placed at your back, or any other hard-to-reach area, ask another person to assist you in performing the following tasks: General care  Keep the skin around the drain dry and covered with a dressing at all times.  Check your drain area every day for signs of infection. Check for: ? Redness, swelling, or pain. ? Pus or a bad  smell. ? Cloudy drainage. ? Tenderness or pressure at the drain exit site. Changing the dressing Follow instructions from your health care provider about how to change your dressing. Change your dressing at least once a day. Change it more often if needed to keep the dressing dry. Make sure you: 1. Gather your supplies. 2. Wash your hands with soap and water before you change  your dressing. If soap and water are not available, use hand sanitizer. 3. Remove the old dressing. Avoid using scissors to do that. 4. Wash your hands with soap and water again after removing the old dressing. 5. Use sterile saline to clean your skin around the drain. You may need to use a cotton swab to clean the skin. 6. Place the tube through the slit in a drain sponge. Place the drain sponge so that it covers your wound. 7. Place the gauze square or another drain sponge on top of the drain sponge that is on the wound. Make sure the tube is between those layers. 8. Tape the dressing to your skin. 9. Tape the drainage tube to your skin 1-2 inches (2.5-5 cm) below the place where the tube enters your body. Taping keeps the tube from pulling on any stitches (sutures) that you have. 10. Wash your hands with soap and water. 11. Write down the color of your drainage and how often you change your dressing. How to empty your active drain  1. Make sure that you have a measuring cup that you can empty your drainage into. 2. Wash your hands with soap and water. If soap and water are not available, use hand sanitizer. 3. Loosen any pins or clips that hold the tube in place. 4. If your health care provider tells you to strip the tube to prevent clots and tube blockages: ? Hold the tube at the skin with one hand. Use your other hand to pinch the tubing with your thumb and first finger. ? Gently move your fingers down the tube while squeezing very lightly. This clears any drainage, clots, or tissue from the tube. ? You may need to do this several times each day to keep the tube clear. Do not pull on the tube. 5. Open the bulb cap or the drain plug. Do not touch the inside of the cap or the bottom of the plug. 6. Turn the device upside down and gently squeeze. 7. Empty all of the drainage into the measuring cup. 8. Compress the bulb or the container and replace the cap or the plug. To compress the bulb or  the container, squeeze it firmly in the middle while you close the cap or plug the container. 9. Write down the amount of drainage that you have in each 24-hour period. If you have less than 2 Tbsp (30 mL) of drainage during 24 hours, contact your health care provider. 10. Flush the drainage down the toilet. 11. Wash your hands with soap and water. Contact a health care provider if:  You have redness, swelling, or pain around your drain area.  You have pus or a bad smell coming from your drain area.  You have a fever or chills.  The skin around your drain is warm to the touch.  The amount of drainage that you have is increasing instead of decreasing.  You have drainage that is cloudy.  There is a sudden stop or a sudden decrease in the amount of drainage that you have.  Your drain tube falls out.  Your  active drain does not stay compressed after you empty it. Summary  Surgical drains are used to remove extra fluid that normally builds up in a surgical wound after surgery.  Different kinds of surgical drains include active drains and passive drains. Active drains use suction to pull drainage away from the surgical wound, and passive drains allow fluid to drain naturally.  It is important to care for your drain to prevent infection. If your drain is placed at your back, or any other hard-to-reach area, ask another person to assist you.  Contact your health care provider if you have redness, swelling, or pain around your drain area. This information is not intended to replace advice given to you by your health care provider. Make sure you discuss any questions you have with your health care provider. Document Released: 05/06/2000 Document Revised: 06/13/2018 Document Reviewed: 06/13/2018 Elsevier Patient Education  [place].  Enoxaparin injection What is this medicine? ENOXAPARIN (ee nox a PA rin) is used after knee, hip, or abdominal surgeries to prevent blood clotting.  It is also used to treat existing blood clots in the lungs or in the veins. This medicine may be used for other purposes; ask your health care provider or pharmacist if you have questions. COMMON BRAND NAME(S): Lovenox What should I tell my health care provider before I take this medicine? They need to know if you have any of these conditions:  bleeding disorders, hemorrhage, or hemophilia  infection of the heart or heart valves  kidney or liver disease  previous stroke  prosthetic heart valve  recent surgery or delivery of a baby  ulcer in the stomach or intestine, diverticulitis, or other bowel disease  an unusual or allergic reaction to enoxaparin, heparin, pork or pork products, other medicines, foods, dyes, or preservatives  pregnant or trying to get pregnant  breast-feeding How should I use this medicine? This medicine is for injection under the skin. It is usually given by a health-care professional. You or a family member may be trained on how to give the injections. If you are to give yourself injections, make sure you understand how to use the syringe, measure the dose if necessary, and give the injection. To avoid bruising, do not rub the site where this medicine has been injected. Do not take your medicine more often than directed. Do not stop taking except on the advice of your doctor or health care professional. Make sure you receive a puncture-resistant container to dispose of the needles and syringes once you have finished with them. Do not reuse these items. Return the container to your doctor or health care professional for proper disposal. Talk to your pediatrician regarding the use of this medicine in children. Special care may be needed. Overdosage: If you think you have taken too much of this medicine contact a poison control center or emergency room at once. NOTE: This medicine is only for you. Do not share this medicine with others. What if I miss a dose? If  you miss a dose, take it as soon as you can. If it is almost time for your next dose, take only that dose. Do not take double or extra doses. What may interact with this medicine?  aspirin and aspirin-like medicines  certain medicines that treat or prevent blood clots  dipyridamole  NSAIDs, medicines for pain and inflammation, like ibuprofen or naproxen This list may not describe all possible interactions. Give your health care provider a list of all the medicines, herbs,  non-prescription drugs, or dietary supplements you use. Also tell them if you smoke, drink alcohol, or use illegal drugs. Some items may interact with your medicine. What should I watch for while using this medicine? Visit your healthcare professional for regular checks on your progress. You may need blood work done while you are taking this medicine. Your condition will be monitored carefully while you are receiving this medicine. It is important not to miss any appointments. If you are going to need surgery or other procedure, tell your healthcare professional that you are using this medicine. Using this medicine for a long time may weaken your bones and increase the risk of bone fractures. Avoid sports and activities that might cause injury while you are using this medicine. Severe falls or injuries can cause unseen bleeding. Be careful when using sharp tools or knives. Consider using an Copy. Take special care brushing or flossing your teeth. Report any injuries, bruising, or red spots on the skin to your healthcare professional. Wear a medical ID bracelet or chain. Carry a card that describes your disease and details of your medicine and dosage times. What side effects may I notice from receiving this medicine? Side effects that you should report to your doctor or health care professional as soon as possible:  allergic reactions like skin rash, itching or hives, swelling of the face, lips, or tongue  bone  pain  signs and symptoms of bleeding such as bloody or black, tarry stools; red or dark-brown urine; spitting up blood or brown material that looks like coffee grounds; red spots on the skin; unusual bruising or bleeding from the eye, gums, or nose  signs and symptoms of a blood clot such as chest pain; shortness of breath; pain, swelling, or warmth in the leg  signs and symptoms of a stroke such as changes in vision; confusion; trouble speaking or understanding; severe headaches; sudden numbness or weakness of the face, arm or leg; trouble walking; dizziness; loss of coordination Side effects that usually do not require medical attention (report to your doctor or health care professional if they continue or are bothersome):  hair loss  pain, redness, or irritation at site where injected This list may not describe all possible side effects. Call your doctor for medical advice about side effects. You may report side effects to FDA at 1-800-FDA-1088. Where should I keep my medicine? Keep out of the reach of children. Store at room temperature between 15 and 30 degrees C (59 and 86 degrees F). Do not freeze. If your injections have been specially prepared, you may need to store them in the refrigerator. Ask your pharmacist. Throw away any unused medicine after the expiration date. NOTE: This sheet is a summary. It may not cover all possible information. If you have questions about this medicine, talk to your doctor, pharmacist, or health care provider.  2020 Elsevier/Gold Standard (2017-05-04 11:25:34)

## 2019-02-15 NOTE — Telephone Encounter (Signed)
Told Stacy Rivas to take the Ibuprophen on a 6 am, 2 pm, 10 pm scheduled with food to not interrupt sleep. Take the Tylenol 500 mg tabs every 6 hours prn in between the ibuprofen. Take the Oxycodone tabs every 4 hours  As needed for pain if pain level is ~5-6/10 while using ibuprofen.and tylenol. Patient verbalized understanding.

## 2019-02-15 NOTE — Progress Notes (Signed)
3 Days Post-Op Procedure(s) (LRB): HYSTERECTOMY ABDOMINAL WITH SALPINGO-OOPHORECTOMY (Bilateral) OMENTECTOMY (N/A) DEBULKING OF TUMOR (N/A)  Subjective: Patient reports passing flatus. Ambulating to the bathroom and in the hall without difficulty. Voiding adequate amounts. No nausea or emesis.  No BM reported.  Denies chest pain, dyspnea, dizziness.  All questions answered. No concerns or needs voiced.  Objective: Vital signs in last 24 hours: Temp:  [98.4 F (36.9 C)-99 F (37.2 C)] 99 F (37.2 C) (09/25 0631) Pulse Rate:  [73-79] 73 (09/25 0631) Resp:  [16-18] 16 (09/25 0631) BP: (112-128)/(59-70) 128/70 (09/25 0631) SpO2:  [97 %-100 %] 97 % (09/25 0631) Last BM Date: 02/14/19  Intake/Output from previous day: 09/24 0701 - 09/25 0700 In: 1080 [P.O.:1080] Out: 2905 [Urine:2750; Drains:155]  Physical Examination: General: alert, cooperative, appears stated age and no distress Resp: clear to auscultation bilaterally Cardio: regular rate and rhythm, S1, S2 normal, no murmur, click, rub or gallop GI: incision: midline incision with op site dressing in place with no active drainage noted underneath and abdomen soft, non-distended, active bowel sounds, JP drain with serosanguinous drainage Extremities: extremities normal, atraumatic, no cyanosis or edema  Assessment: 63 y.o. s/p Procedure(s): HYSTERECTOMY ABDOMINAL WITH SALPINGO-OOPHORECTOMY OMENTECTOMY DEBULKING OF TUMOR: stable Pain:  Pain is well-controlled on PRN medications.  Heme: Hgb 7.8 and Hct 24.7 02/14/2019 am. Anemia related to acute blood loss from surgery-no active bleeding. Was 11.5 and Hct 36.1 pre-op. 500 mL with EBL. No need for transfusion at this time.   CV: BP and HR stable.  Continue to monitor with ordered vital signs.   GI:  Tolerating po: Yes. Regular diet ordered.       GU: Voiding. Adequate urine outpt recorded. Creatinine 0.48 02/14/2019 am.   FEN: No critical values this am.  Prophylaxis:  pharmacologic prophylaxis (with any of the following: enoxaparin (Lovenox) '40mg'$  SQ 2 hours prior to surgery then every day) and intermittent pneumatic compression boots.  Plan: JP teaching Lovenox teaching kit for home use Encourage ambulation, IS use, deep breathing and coughing Pt to be discharged with JP drain Continue plan of care per Dr. Denman George Plan for possible discharge today   LOS: 3 days    Chenika Nevils D Earlisha Sharples 02/15/2019, 9:08 AM

## 2019-02-15 NOTE — Progress Notes (Signed)
Discharge instructions discussed with patient and spouse, verbalized agreement and understanding,

## 2019-02-15 NOTE — Discharge Summary (Addendum)
Physician Discharge Summary  Patient ID: Stacy Rivas MRN: [id number] DOB/AGE: 65-Feb-1957 65 y.o.  Admit date: 02/12/2019 Discharge date: 02/15/2019  Admission Diagnoses: Ovarian cancer [facility])  Discharge Diagnoses:  Principal Problem:   Ovarian cancer [facility])   Discharged Condition:  The patient is in good condition and stable for discharge.    Hospital Course: On 02/12/2019, the patient underwent the following: Procedure(s): HYSTERECTOMY ABDOMINAL WITH SALPINGO-OOPHORECTOMY OMENTECTOMY DEBULKING OF TUMOR.  The postoperative course was uneventful.  She was discharged to home on postoperative day 3 tolerating a regular diet, ambulating, passing flatus, pain controlled with oral medications.  She is discharge home with the JP in place with plans for removal in one week if body fluid creatinine is normal.  Consults: None  Significant Diagnostic Studies: None  Treatments: surgery: see above  Discharge Exam: Blood pressure 128/70, pulse 73, temperature 99 F (37.2 C), temperature source Oral, resp. rate 16, SpO2 97 %. General appearance: alert, cooperative and no distress Resp: clear to auscultation bilaterally Cardio: regular rate and rhythm, S1, S2 normal, no murmur, click, rub or gallop GI: soft, non-tender; bowel sounds normal; no masses,  no organomegaly Extremities: extremities normal, atraumatic, no cyanosis or edema Incision/Wound: Midline incision with op site dressing in place with no active drainage noted underneath. JP drain with serosanguinous drainage and dressing intact.  Disposition: Discharge disposition: 01-Home or Self Care       Discharge Instructions    Call MD for:  difficulty breathing, headache or visual disturbances   Complete by: As directed    Call MD for:  extreme fatigue   Complete by: As directed    Call MD for:  hives   Complete by: As directed    Call MD for:  persistant dizziness or light-headedness   Complete by: As directed    Call MD  for:  persistant nausea and vomiting   Complete by: As directed    Call MD for:  redness, tenderness, or signs of infection (pain, swelling, redness, odor or green/yellow discharge around incision site)   Complete by: As directed    Call MD for:  severe uncontrolled pain   Complete by: As directed    Call MD for:  temperature >100.4   Complete by: As directed    Change dressing   Complete by: As directed    Around JP site prior to discharge and instruct patient on how to change and give drain sponges   Diet - low sodium heart healthy   Complete by: As directed    Driving Restrictions   Complete by: As directed    No driving for 2 weeks.  Do not take narcotics and drive.   Increase activity slowly   Complete by: As directed    Lifting restrictions   Complete by: As directed    No lifting greater than 10 lbs.   Sexual Activity Restrictions   Complete by: As directed    No sexual activity, nothing in the vagina, for 6 weeks.     Allergies as of 02/15/2019      Reactions   Shrimp [shellfish Allergy] Itching   Sulfonamide Derivatives Itching, Nausea Only      Medication List    STOP taking these medications   erythromycin base 500 MG tablet Commonly known as: E-MYCIN   neomycin 500 MG tablet Commonly known as: MYCIFRADIN     TAKE these medications   acetaminophen 500 MG tablet Commonly known as: TYLENOL Take 500 mg by mouth every 6 (  six) hours as needed for mild pain.   ALPRAZolam 0.25 MG tablet Commonly known as: XANAX Take 1 tablet (0.25 mg total) by mouth at bedtime as needed for anxiety. What changed: how much to take   carboxymethylcellulose 0.5 % Soln Commonly known as: REFRESH PLUS Place 1 drop into both eyes 2 (two) times daily as needed (dry eyes).   dexamethasone 4 MG tablet Commonly known as: DECADRON Take 1 tablet (4 mg total) by mouth daily. What changed:   how much to take  when to take this  additional instructions   diphenhydrAMINE 25 mg  capsule Commonly known as: BENADRYL Take 25 mg by mouth at bedtime as needed for sleep.   enoxaparin 40 MG/0.4ML injection Commonly known as: LOVENOX Inject 0.4 mLs (40 mg total) into the skin daily for 11 doses. Start taking on: February 16, 2019   esomeprazole 20 MG capsule Commonly known as: NEXIUM Take 20 mg by mouth daily.   ferrous gluconate 324 MG tablet Commonly known as: FERGON Take 1 tablet (324 mg total) by mouth 3 (three) times daily with meals.   ibuprofen 800 MG tablet Commonly known as: ADVIL Take 1 tablet (800 mg total) by mouth every 8 (eight) hours as needed for moderate pain. For AFTER surgery   lidocaine-prilocaine cream Commonly known as: EMLA Apply to affected area once   loratadine 10 MG tablet Commonly known as: CLARITIN Take 10 mg by mouth daily as needed for allergies.   multivitamin with minerals Tabs tablet Take 1 tablet by mouth daily.   ondansetron 8 MG tablet Commonly known as: Zofran Take 1 tablet (8 mg total) by mouth every 8 (eight) hours as needed for refractory nausea / vomiting.   oxyCODONE 5 MG immediate release tablet Commonly known as: Oxy IR/ROXICODONE Take 1 tablet (5 mg total) by mouth every 4 (four) hours as needed for severe pain. For AFTER surgery, do not take and drive   polyethylene glycol 17 g packet Commonly known as: MIRALAX / GLYCOLAX Take 17 g by mouth daily as needed for mild constipation.   prochlorperazine 10 MG tablet Commonly known as: COMPAZINE Take 1 tablet (10 mg total) by mouth every 6 (six) hours as needed (Nausea or vomiting).   senna-docusate 8.6-50 MG tablet Commonly known as: Senokot-S Take 2 tablets by mouth at bedtime. For AFTER surgery, do not take if having diarrhea   traMADol 50 MG tablet Commonly known as: ULTRAM Take 1 tablet (50 mg total) by mouth every 6 (six) hours as needed. What changed: reasons to take this   Vitamin D3 25 MCG (1000 UT) Caps Take 1,000 Units by mouth daily.             Discharge Care Instructions  (From admission, onward)         Start     Ordered   02/15/19 0000  Change dressing    Comments: Around JP site prior to discharge and instruct patient on how to change and give drain sponges   02/15/19 0937         Follow-up Information    Joylene John D, NP Follow up on 02/20/2019.   Specialty: Gynecologic Oncology Why: at 11 am at the [facility] for staple and JP drain removal.  We are going to need you to come in the day before (02/19/2019) at [place] so our nurse, Santiago Glad, can take a sample of the JP drain fluid so we will have the results the next day for your appt  Contact information: [place] [address] [phone]        Everitt Amber, MD Follow up on 03/01/2019.   Specialty: Gynecologic Oncology Why: at 2:15am at the [facility] for post-op follow up Contact information: [place] [address] [phone]        Lab appt at the [facility] Follow up on 02/19/2019.   Why: Come to the [place] at 9:30am and come to the Breast Cancer waiting area where our nurse Santiago Glad will meet you to get a sample of fluid from your JP drain and then she will take it to lab.          Greater than thirty minutes were spend for face to face discharge instructions and discharge orders/summary in EPIC.   Signed: Dorothyann Gibbs 02/15/2019, 9:52 AM

## 2019-02-19 ENCOUNTER — Inpatient Hospital Stay: Payer: Managed Care, Other (non HMO)

## 2019-02-19 ENCOUNTER — Other Ambulatory Visit: Payer: Self-pay

## 2019-02-19 DIAGNOSIS — Z79899 Other long term (current) drug therapy: Secondary | ICD-10-CM | POA: Diagnosis not present

## 2019-02-19 DIAGNOSIS — K589 Irritable bowel syndrome without diarrhea: Secondary | ICD-10-CM | POA: Diagnosis not present

## 2019-02-19 DIAGNOSIS — C569 Malignant neoplasm of unspecified ovary: Secondary | ICD-10-CM | POA: Diagnosis present

## 2019-02-19 DIAGNOSIS — R971 Elevated cancer antigen 125 [CA 125]: Secondary | ICD-10-CM | POA: Diagnosis not present

## 2019-02-19 DIAGNOSIS — E785 Hyperlipidemia, unspecified: Secondary | ICD-10-CM | POA: Diagnosis not present

## 2019-02-19 DIAGNOSIS — F419 Anxiety disorder, unspecified: Secondary | ICD-10-CM | POA: Diagnosis not present

## 2019-02-19 DIAGNOSIS — K449 Diaphragmatic hernia without obstruction or gangrene: Secondary | ICD-10-CM | POA: Diagnosis not present

## 2019-02-19 DIAGNOSIS — C786 Secondary malignant neoplasm of retroperitoneum and peritoneum: Secondary | ICD-10-CM | POA: Diagnosis not present

## 2019-02-19 DIAGNOSIS — K5790 Diverticulosis of intestine, part unspecified, without perforation or abscess without bleeding: Secondary | ICD-10-CM | POA: Diagnosis not present

## 2019-02-19 DIAGNOSIS — K219 Gastro-esophageal reflux disease without esophagitis: Secondary | ICD-10-CM | POA: Diagnosis not present

## 2019-02-19 DIAGNOSIS — Z87891 Personal history of nicotine dependence: Secondary | ICD-10-CM | POA: Diagnosis not present

## 2019-02-19 DIAGNOSIS — Z9221 Personal history of antineoplastic chemotherapy: Secondary | ICD-10-CM | POA: Diagnosis not present

## 2019-02-19 DIAGNOSIS — D259 Leiomyoma of uterus, unspecified: Secondary | ICD-10-CM | POA: Diagnosis not present

## 2019-02-19 LAB — CBC WITH DIFFERENTIAL (CANCER CENTER ONLY)
Abs Immature Granulocytes: 0.01 10*3/uL (ref 0.00–0.07)
Basophils Absolute: 0 10*3/uL (ref 0.0–0.1)
Basophils Relative: 0 %
Eosinophils Absolute: 0.1 10*3/uL (ref 0.0–0.5)
Eosinophils Relative: 2 %
HCT: 27.9 % — ABNORMAL LOW (ref 36.0–46.0)
Hemoglobin: 9.1 g/dL — ABNORMAL LOW (ref 12.0–15.0)
Immature Granulocytes: 0 %
Lymphocytes Relative: 25 %
Lymphs Abs: 1.4 10*3/uL (ref 0.7–4.0)
MCH: 31.5 pg (ref 26.0–34.0)
MCHC: 32.6 g/dL (ref 30.0–36.0)
MCV: 96.5 fL (ref 80.0–100.0)
Monocytes Absolute: 0.5 10*3/uL (ref 0.1–1.0)
Monocytes Relative: 8 %
Neutro Abs: 3.8 10*3/uL (ref 1.7–7.7)
Neutrophils Relative %: 65 %
Platelet Count: 339 10*3/uL (ref 150–400)
RBC: 2.89 MIL/uL — ABNORMAL LOW (ref 3.87–5.11)
RDW: 22 % — ABNORMAL HIGH (ref 11.5–15.5)
WBC Count: 5.8 10*3/uL (ref 4.0–10.5)
nRBC: 0 % (ref 0.0–0.2)

## 2019-02-19 LAB — CMP (CANCER CENTER ONLY)
ALT: 53 U/L — ABNORMAL HIGH (ref 0–44)
AST: 38 U/L (ref 15–41)
Albumin: 3.7 g/dL (ref 3.5–5.0)
Alkaline Phosphatase: 365 U/L — ABNORMAL HIGH (ref 38–126)
Anion gap: 8 (ref 5–15)
BUN: 10 mg/dL (ref 8–23)
CO2: 26 mmol/L (ref 22–32)
Calcium: 9.1 mg/dL (ref 8.9–10.3)
Chloride: 108 mmol/L (ref 98–111)
Creatinine: 0.6 mg/dL (ref 0.44–1.00)
GFR, Est AFR Am: >60 mL/min (ref >60)
GFR, Estimated: >60 mL/min (ref >60)
Glucose, Bld: 116 mg/dL — ABNORMAL HIGH (ref 70–99)
Potassium: 3.7 mmol/L (ref 3.5–5.1)
Sodium: 142 mmol/L (ref 135–145)
Total Bilirubin: 0.6 mg/dL (ref 0.3–1.2)
Total Protein: 6.4 g/dL — ABNORMAL LOW (ref 6.5–8.1)

## 2019-02-19 LAB — CREATININE, FLUID (PLEURAL, PERITONEAL, JP DRAINAGE): Creat, Fluid: 0.7 mg/dL

## 2019-02-20 ENCOUNTER — Encounter: Payer: Self-pay | Admitting: Gynecologic Oncology

## 2019-02-20 ENCOUNTER — Other Ambulatory Visit: Payer: Self-pay

## 2019-02-20 ENCOUNTER — Inpatient Hospital Stay (HOSPITAL_BASED_OUTPATIENT_CLINIC_OR_DEPARTMENT_OTHER): Payer: Managed Care, Other (non HMO) | Admitting: Gynecologic Oncology

## 2019-02-20 VITALS — BP 142/68 | HR 75 | Temp 98.7°F | Resp 20 | Ht 62.0 in | Wt 130.8 lb

## 2019-02-20 DIAGNOSIS — Z90722 Acquired absence of ovaries, bilateral: Secondary | ICD-10-CM

## 2019-02-20 DIAGNOSIS — Z90710 Acquired absence of both cervix and uterus: Secondary | ICD-10-CM

## 2019-02-20 DIAGNOSIS — C569 Malignant neoplasm of unspecified ovary: Secondary | ICD-10-CM

## 2019-02-20 LAB — CA 125: Cancer Antigen (CA) 125: 40.3 U/mL — ABNORMAL HIGH (ref 0.0–38.1)

## 2019-02-20 NOTE — Patient Instructions (Signed)
Doing well! Continue with post-op instructions.  Plan to follow up as scheduled or sooner if needed. Please call for any needs or concerns.

## 2019-02-20 NOTE — Progress Notes (Addendum)
Follow Up Post-op Note: Gyn-Onc  Stacy Rivas 63 y.o. female  CC:  Chief Complaint  Patient presents with  . Routine Post Op    Follow up, JP/staple removal    HPI:  Stacy Rivas is a 63 year old female originally seen at the request of Dr. Hilarie Rivas and Dr. Reynaldo Minium for a pelvic mass and ascites. In November of 2019, she had taken antibiotics for a tooth issue and did not feel "right" after that. She then began having worse reflux and bloating in 3/20. She states that due to [place], she decided to "stick it out". She then communicated with Dr. Hilarie Rivas and a CT scan was ordered 11/09/18:  1. 14.7 cm poorly defined soft tissue mass in central pelvis suspicious for primary ovarian carcinoma, with uterine leiomyosarcoma considered a less likely differential diagnosis.  2. Moderate ascites and diffuse peritoneal carcinomatosis.  3. Several small uterine fibroids.  She states that in retrospect, her symptoms of reflux have been getting worse but she thought that it was due to her antibiotics. She was also having increased diarrhea, lower abdominal pressure and fullness, increased gas and early satiety. She though that her hiatal hernia may also be worse. She states that she was always on the look out for bleeding as she is PMP, but was not aware of these symptoms. She went through MP around [age] but never took any HRT. She took OCPs for about 10 years cumulatively in college.  Health maintenance is up-to-date and she is not due for repeat colonoscopy until 2024.  She has never had an abnormal Pap smear.  At the time of diagnosis CA 125 was very elevated to 1434 on 11/13/18.  Her Albumin was <3.5 and her large volume ascites required multiple paracenteses to control. A tentative surgical date of June 25 have been set however due to scheduling issues this needed to be postponed.  Additionally given the multiple markers of low probability for complete cytoreduction (large volume ascites, primary tumor  greater than 10 cm, Ca1 25 greater than 500, serum albumin less than 3.5, it was determined that a primary cytoreductive effort would likely be futile and subjected to high risk of complications.  Therefore, because neoadjuvant chemotherapy is associated with the same long-term survival from ovarian cancer but improved perioperative outcomes, a decision was made with the patient to proceed with tissue biopsy followed by neoadjuvant chemotherapy with carboplatin paclitaxel.  Cytology from her paracenteses confirmed adenocarcinoma favor GYN primary (this was performed on November 21, 2018). She commenced her first of 3 cycles of neoadjuvant carboplatin paclitaxel on December 06, 2018.  CA 125 on 02/01/19 showed a reduction to 187.  Cycle 3 was administered on January 17, 2019.  Repeat CT scan of the abdomen and pelvis was performed on February 01, 2019.  It revealed the left complex ovarian mass now measured 8.6 x 5.8 x 7.7 cm decreased from 14.7 x 9.5 x 11.3 cm.  There was a small volume of residual abdominal ascites tracking beside the liver and spleen.  The peritoneal nodularity omental caking is improved but persistent with a dominant implant beneath the right mid anterior abdominal wall measuring 1 cm.  On 02/12/2019, she underwent an exploratory laparotomy with total abdominal hysterectomy, bilateral salpingo-oophorectomy, omentectomy radical tumor debulking for ovarian cancer by Dr. Everitt Amber. Final path to be discussed at upcoming visit.  Interval History: She presents today for post-op follow up and JP/staple removal.  She reports doing well since surgery. Tolerating diet with  no nausea or emesis.  Passing flatus and having bowel movements.  BM last pm was diarrhea but none this am. Incisional pain relieved with Tylenol. She has not had to take oxycodone for the past three days.  No drainage or redness reported with the incision.  She has been walking without difficulty but reports feeling intermittent  fatigue.  JP output: 9/27 (10 cc), 9/28 (18), 9/29 (8), 9/30(14). Recording sheet to be scanned under media.  Review of Systems: Constitutional: Feels well. No fever, chills.  Cardiovascular: No chest pain, shortness of breath, or edema.  Pulmonary: No cough or wheeze.  Gastrointestinal: No nausea, vomiting.  One episode of diarrhea last pm. No bright red blood per rectum.  Genitourinary: No frequency, urgency, or dysuria. No vaginal bleeding or discharge.  Musculoskeletal: No myalgia or joint pain. Neurologic: No weakness, numbness, or change in gait.  Psychology: No depression, anxiety, or insomnia.   Current Meds:  Outpatient Encounter Medications as of 02/20/2019  Medication Sig  . acetaminophen (TYLENOL) 500 MG tablet Take 500 mg by mouth every 6 (six) hours as needed for mild pain.  [name] Kitchen ALPRAZolam (XANAX) 0.25 MG tablet Take 1 tablet (0.25 mg total) by mouth at bedtime as needed for anxiety. (Patient taking differently: Take 0.125 mg by mouth at bedtime as needed for anxiety. )  . carboxymethylcellulose (REFRESH PLUS) 0.5 % SOLN Place 1 drop into both eyes 2 (two) times daily as needed (dry eyes).  . Cholecalciferol (VITAMIN D3) 25 MCG (1000 UT) CAPS Take 1,000 Units by mouth daily.   [name] Kitchen dexamethasone (DECADRON) 4 MG tablet Take 1 tablet (4 mg total) by mouth daily. (Patient taking differently: Take 12 mg by mouth See admin instructions. Take 3 tablets the night before and 3 tablets in the morning prior to chemotherapy.)  . diphenhydrAMINE (BENADRYL) 25 mg capsule Take 25 mg by mouth at bedtime as needed for sleep.  [name] Kitchen enoxaparin (LOVENOX) 40 MG/0.4ML injection Inject 0.4 mLs (40 mg total) into the skin daily for 11 doses.  [name] Kitchen esomeprazole (NEXIUM) 20 MG capsule Take 20 mg by mouth daily.   . ferrous gluconate (FERGON) 324 MG tablet Take 1 tablet (324 mg total) by mouth 3 (three) times daily with meals.  [name] Kitchen ibuprofen (ADVIL) 800 MG tablet Take 1 tablet (800 mg total) by mouth every 8 (eight)  hours as needed for moderate pain. For AFTER surgery  . lidocaine-prilocaine (EMLA) cream Apply to affected area once  . loratadine (CLARITIN) 10 MG tablet Take 10 mg by mouth daily as needed for allergies.  . Multiple Vitamin (MULTIVITAMIN WITH MINERALS) TABS tablet Take 1 tablet by mouth daily.  . ondansetron (ZOFRAN) 8 MG tablet Take 1 tablet (8 mg total) by mouth every 8 (eight) hours as needed for refractory nausea / vomiting.  [name] Kitchen oxyCODONE (OXY IR/ROXICODONE) 5 MG immediate release tablet Take 1 tablet (5 mg total) by mouth every 4 (four) hours as needed for severe pain. For AFTER surgery, do not take and drive  . polyethylene glycol (MIRALAX / GLYCOLAX) 17 g packet Take 17 g by mouth daily as needed for mild constipation.  . prochlorperazine (COMPAZINE) 10 MG tablet Take 1 tablet (10 mg total) by mouth every 6 (six) hours as needed (Nausea or vomiting).  [name] Kitchen senna-docusate (SENOKOT-S) 8.6-50 MG tablet Take 2 tablets by mouth at bedtime. For AFTER surgery, do not take if having diarrhea  . traMADol (ULTRAM) 50 MG tablet Take 1 tablet (50 mg total) by mouth every 6 (six) hours  as needed. (Patient taking differently: Take 50 mg by mouth every 6 (six) hours as needed for moderate pain. )   No facility-administered encounter medications on file as of 02/20/2019.     Allergy:  Allergies  Allergen Reactions  . Shrimp [Shellfish Allergy] Itching  . Sulfonamide Derivatives Itching and Nausea Only    Social Hx:   Social History   Socioeconomic History  . Marital status: Married    Spouse name: Not on file  . Number of children: 0  . Years of education: Not on file  . Highest education level: Not on file  Occupational History  . Occupation: Geographical information systems officer  . Financial resource strain: Not on file  . Food insecurity    Worry: Not on file    Inability: Not on file  . Transportation needs    Medical: Not on file    Non-medical: Not on file  Tobacco Use  . Smoking status: Former  Smoker    Years: 10.00    Types: Cigarettes    Quit date: 05/23/1980    Years since quitting: 38.7  . Smokeless tobacco: Never Used  . Tobacco comment: quit 1984  Substance and Sexual Activity  . Alcohol use: Yes    Alcohol/week: 0.0 standard drinks    Comment: 2/day, 11-21-2018   no alcodholint he last month   . Drug use: No  . Sexual activity: Yes    Partners: Male  Lifestyle  . Physical activity    Days per week: Not on file    Minutes per session: Not on file  . Stress: Not on file  Relationships  . Social Herbalist on phone: Not on file    Gets together: Not on file    Attends religious service: Not on file    Active member of club or organization: Not on file    Attends meetings of clubs or organizations: Not on file    Relationship status: Not on file  . Intimate partner violence    Fear of current or ex partner: Not on file    Emotionally abused: Not on file    Physically abused: Not on file    Forced sexual activity: Not on file  Other Topics Concern  . Not on file  Social History Narrative  . Not on file    Past Surgical Hx:  Past Surgical History:  Procedure Laterality Date  . CATARACT EXTRACTION, BILATERAL    . cystoscopy with instillation for cystitis  2001  . DEBULKING N/A 02/12/2019   Procedure: DEBULKING OF TUMOR;  Surgeon: Everitt Amber, MD;  Location: [facility];  Service: Gynecology;  Laterality: N/A;  . DILATION AND CURETTAGE OF UTERUS     miscarriage  . HYSTERECTOMY ABDOMINAL WITH SALPINGO-OOPHORECTOMY Bilateral 02/12/2019   Procedure: HYSTERECTOMY ABDOMINAL WITH SALPINGO-OOPHORECTOMY;  Surgeon: Everitt Amber, MD;  Location: [facility];  Service: Gynecology;  Laterality: Bilateral;  . IR IMAGING GUIDED PORT INSERTION  12/06/2018  . IR PARACENTESIS  12/03/2018  . IR PARACENTESIS  12/14/2018  . LAPAROSCOPY     adhesions  . LITHOTRIPSY  07/2013   x 2  . MYOMECTOMY  1992  . OMENTECTOMY N/A 02/12/2019   Procedure: OMENTECTOMY;  Surgeon: Everitt Amber, MD;   Location: [facility];  Service: Gynecology;  Laterality: N/A;  . PARACENTESIS  11/21/2018   abdominal , removed 3.1 Liters     Past Medical Hx:  Past Medical History:  Diagnosis Date  . Anxiety state, unspecified   .  Diverticulosis   . Esophageal reflux   . Fibroid tumor   . Headache    due to allergies   . Hiatal hernia   . Hyperlipidemia   . IBS (irritable bowel syndrome)   . Internal hemorrhoids   . Nonspecific elevation of levels of transaminase or lactic acid dehydrogenase (LDH)   . Renal stone 07/2013  . Tubulovillous adenoma of colon   . Varicose veins    superficail thrombophlebitis (left LE)    Family Hx:  Family History  Problem Relation Age of Onset  . Lymphoma Father   . Heart disease Father   . Hypertension Father   . Hypertension Mother   . Hyperlipidemia Mother   . Breast cancer Mother 62  . Diabetes Maternal Grandmother   . Uterine cancer Maternal Grandmother   . Stomach cancer Paternal Grandfather   . Colon cancer Neg Hx     Vitals:  Blood pressure (!) 142/68, pulse 75, temperature 98.7 F (37.1 C), temperature source Tympanic, resp. rate 20, height 5\' 2"  (1.575 m), weight 130 lb 12.8 oz (59.3 kg).  Body fluid creatinine from JP drain 02/19/2019: 0.7  Physical Exam:  General: Well developed, well nourished female in no acute distress. Alert and oriented x 3.  Cardiovascular: Regular rate and rhythm. S1 and S2 normal.  Lungs: Clear to auscultation bilaterally. No wheezes/crackles/rhonchi noted.  Skin: Healing abrasion noted on upper left abd from tape. No rashes or lesions present. Abdomen: Abdomen soft, non-tender and non-obese. Active bowel sounds in all quadrants. No evidence of a fluid wave or abdominal masses. 32 staples removed from the midline incision without difficulty. 1/2 inch steri strips applied. JP drain removed from the left abdomen without difficulty and dry dressing placed. Pt tolerated well.  Extremities: No bilateral cyanosis, edema, or  clubbing.   Assessment/Plan: 63 year old female s/p exploratory laparotomy with total abdominal hysterectomy, bilateral salpingo-oophorectomy, omentectomy radical tumor debulking for ovarian cancer with Dr. Denman George on 02/12/2019. She is doing well post-operatively.  Incisional care discussed. She is to follow up as scheduled with Dr. Denman George on March 01, 2019 or sooner if needed.  Pathology will be discussed at that time. She is advised to call for any questions or concerns.   Dorothyann Gibbs, NP 02/20/2019, 12:30 PM

## 2019-03-01 ENCOUNTER — Other Ambulatory Visit: Payer: Self-pay

## 2019-03-01 ENCOUNTER — Telehealth: Payer: Self-pay | Admitting: Oncology

## 2019-03-01 ENCOUNTER — Encounter: Payer: Self-pay | Admitting: Gynecologic Oncology

## 2019-03-01 ENCOUNTER — Inpatient Hospital Stay: Payer: Managed Care, Other (non HMO) | Attending: Gynecologic Oncology | Admitting: Gynecologic Oncology

## 2019-03-01 VITALS — BP 136/68 | HR 82 | Temp 98.0°F | Resp 16 | Ht 62.0 in | Wt 132.1 lb

## 2019-03-01 DIAGNOSIS — Z87891 Personal history of nicotine dependence: Secondary | ICD-10-CM | POA: Diagnosis not present

## 2019-03-01 DIAGNOSIS — C786 Secondary malignant neoplasm of retroperitoneum and peritoneum: Secondary | ICD-10-CM | POA: Insufficient documentation

## 2019-03-01 DIAGNOSIS — D6481 Anemia due to antineoplastic chemotherapy: Secondary | ICD-10-CM | POA: Diagnosis not present

## 2019-03-01 DIAGNOSIS — Z7189 Other specified counseling: Secondary | ICD-10-CM

## 2019-03-01 DIAGNOSIS — Z90710 Acquired absence of both cervix and uterus: Secondary | ICD-10-CM | POA: Insufficient documentation

## 2019-03-01 DIAGNOSIS — E785 Hyperlipidemia, unspecified: Secondary | ICD-10-CM | POA: Insufficient documentation

## 2019-03-01 DIAGNOSIS — K219 Gastro-esophageal reflux disease without esophagitis: Secondary | ICD-10-CM | POA: Diagnosis not present

## 2019-03-01 DIAGNOSIS — C569 Malignant neoplasm of unspecified ovary: Secondary | ICD-10-CM

## 2019-03-01 DIAGNOSIS — Z79899 Other long term (current) drug therapy: Secondary | ICD-10-CM | POA: Diagnosis not present

## 2019-03-01 DIAGNOSIS — C561 Malignant neoplasm of right ovary: Secondary | ICD-10-CM | POA: Insufficient documentation

## 2019-03-01 DIAGNOSIS — K580 Irritable bowel syndrome with diarrhea: Secondary | ICD-10-CM | POA: Diagnosis not present

## 2019-03-01 DIAGNOSIS — Z5111 Encounter for antineoplastic chemotherapy: Secondary | ICD-10-CM | POA: Diagnosis not present

## 2019-03-01 DIAGNOSIS — C562 Malignant neoplasm of left ovary: Secondary | ICD-10-CM | POA: Diagnosis present

## 2019-03-01 DIAGNOSIS — Z23 Encounter for immunization: Secondary | ICD-10-CM | POA: Insufficient documentation

## 2019-03-01 DIAGNOSIS — K449 Diaphragmatic hernia without obstruction or gangrene: Secondary | ICD-10-CM | POA: Diagnosis not present

## 2019-03-01 DIAGNOSIS — Z90722 Acquired absence of ovaries, bilateral: Secondary | ICD-10-CM | POA: Diagnosis not present

## 2019-03-01 DIAGNOSIS — C563 Malignant neoplasm of bilateral ovaries: Secondary | ICD-10-CM

## 2019-03-01 NOTE — Patient Instructions (Signed)
If your bladder feels unusual still when you return next week, let us know and we will order a test of your urine.  Continue the iron tablets twice a day.  Align can be used to keep you regular rather than sennakot.   It is safe (and important) to get a flu shot this year.  Dr Denman George will see you back after you have completed your last 3 doses of chemotherapy.

## 2019-03-01 NOTE — Telephone Encounter (Signed)
Stacy Rivas with appointment for genetic counseling on 03/07/19 at 83 am with lab to follow.  She verbalized understanding and agreement.

## 2019-03-01 NOTE — Progress Notes (Signed)
Follow-up Note: Gyn-Onc  Stacy Rivas 63 y.o. female  CC:  Chief Complaint  Patient presents with  . 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  She has healed well from surgery and is cleared to resume chemotherapy for 3 additional cycles.  She will have genetics testing and somatic testing for HRD. If positive, she will be recommended to start Olaparib maintenance after adjuvant therapy.     She will stay on iron replacement for now for anemia.    HPI:  The patient was seen originally in consultation at the request of Dr. Hilarie Fredrickson. PCP: Dr. Reynaldo Minium for pelvic mass and ascites.  Patient is a very pleasant G1P0 who took some antibiotics in 11/19 for a tooth issue and did not feel "right" after that. She then began having worse reflux and bloating in 3/20. She states that due to [place], she decided to "stick it out". She then communicated with Dr. Hilarie Fredrickson and a CT scan was ordered 11/09/18:  -Hepatobiliary: No hepatic masses identified. Gallbladder is unremarkable. -Adrenals/Urinary Tract: No masses identified. Several small less than 1 cm left renal calculi are identified. No evidence of ureteralcalculi or hydronephrosis. -Stomach/Bowel: Small hiatal hernia noted. No evidence of obstruction, inflammatory process or abnormal fluid collections.  -Vascular/Lymphatic: No pathologically enlarged lymph nodes. No abdominal aortic aneurysm.  -Reproductive: Retroflexed uterus is seen containing multiple small fibroids, largest in the anterior lower uterine segment measuring 5.5 cm and showing cystic degeneration.  -A large poorly defined soft tissue mass is seen in the central pelvis which is contiguous with the anterior wall  of the retroflexed uterus. This measures 14.7 x 9.5 by 11.3 cm, and is suspicious for ovarian carcinoma, with uterine leiomyosarcoma considered a less likely differential diagnosis.  -Other: Moderate ascites is seen. Omental soft tissue caking and multiple peritoneal nodules are present in the abdomen and pelvis, consistent with diffuse peritoneal carcinomatosis. IMPRESSION: 1. 14.7 cm poorly defined soft tissue mass in central pelvis suspicious for primary ovarian carcinoma, with uterine leiomyosarcoma considered a less likely differential diagnosis. 2. Moderate ascites and diffuse peritoneal carcinomatosis. 3. Several small uterine fibroids.  She states that in retrospect, her symptoms of reflux have been getting worse but she thought that it was due to her antibiotics. She was also having increased diarrhea, lower abdominal pressure and fullness, increased gas and early satiety. She though that her hiatal hernia may also be worse. She states that she was always on the look out for bleeding as she is PMP, but was not aware of these symptoms. She went through MP around [age] but never took any HRT. She took OCPs for about 10 years cumulatively in college.  Health maintenance is up-to-date and she is not due for repeat colonoscopy until 2024.  She has never had an abnormal Pap smear.  At the time of diagnosis CA 125 was very elevated to 1434 on 11/13/18. Her Albumin was <3.5 and her large volume ascites required multiple paracenteses to control.   A tentative surgical date of June 25 have been set however due to scheduling issues this needed to be postponed.  Additionally given the multiple markers of low probability for complete cytoreduction (large volume ascites, primary tumor greater than 10 cm, Ca1 25 greater than 500, serum albumin less  than 3.5, it was determined that a primary cytoreductive effort would likely be futile and subjected to high risk of complications.  Therefore, because neoadjuvant  chemotherapy is associated with the same long-term survival from ovarian cancer but improved perioperative outcomes, a decision was made with the patient to proceed with tissue biopsy followed by neoadjuvant chemotherapy with carboplatin paclitaxel.  Cytology from her paracenteses confirmed adenocarcinoma favor GYN primary (this was performed on November 21, 2018).  She commenced her first of 3 cycles of neoadjuvant carboplatin paclitaxel on December 06, 2018.  CA 125 on 02/01/19 showed a reduction to 187.  Cycle 3 was administered on January 17, 2019.  Repeat CT scan of the abdomen and pelvis was performed on February 01, 2019.  It revealed the left complex ovarian mass now measured 8.6 x 5.8 x 7.7 cm decreased from 14.7 x 9.5 x 11.3 cm.  There was a small volume of residual abdominal ascites tracking beside the liver and spleen.  The peritoneal nodularity omental caking is improved but persistent with a dominant implant beneath the right mid anterior abdominal wall measuring 1 cm.  Interval Hx:  On February 12, 2019 she underwent an exploratory laparotomy TAH/BSO omentectomy radical tumor debulking.  Intraoperative findings were significant for an upper abdomen free of disease, no visible omental disease, small volume ascites, an 8 cm friable mass replacing left ovary and adherent to the sigmoid colon mesentery and ureter on the left.  It was an optimal cytoreduction, R0, with no gross visible residual disease remaining.  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  Postoperatively she did well and was discharged on postoperative day 2.  She had Ca1 25 drawn on February 19, 2019 in the postoperative period and this had reduced to 40.3.  She developed postoperative anemia to 7.3.   She had been started on iron replacement therapy for this and repeat labs showed it had increased to 9 mg/dL.  CMP Latest Ref Rng & Units 02/19/2019 02/14/2019 02/13/2019  Glucose 70 - 99 mg/dL 116(H) 100(H) 191(H)  BUN 8 - 23 mg/dL '10 8 9  '$ Creatinine 0.44 - 1.00 mg/dL 0.60 0.48 0.43(L)  Sodium 135 - 145 mmol/L 142 142 136  Potassium 3.5 - 5.1 mmol/L 3.7 3.5 3.8  Chloride 98 - 111 mmol/L 108 111 106  CO2 22 - 32 mmol/L '26 25 22  '$ Calcium 8.9 - 10.3 mg/dL 9.1 8.3(L) 7.8(L)  Total Protein 6.5 - 8.1 g/dL 6.4(L) - -  Total Bilirubin 0.3 - 1.2 mg/dL 0.6 - -  Alkaline Phos 38 - 126 U/L 365(H) - -  AST 15 - 41 U/L 38 - -  ALT 0 - 44 U/L 53(H) - -   CBC    Component Value Date/Time   WBC 5.8 02/19/2019 1009   WBC 9.1 02/14/2019 0409   RBC 2.89 (L) 02/19/2019 1009   HGB 9.1 (L) 02/19/2019 1009   HCT 27.9 (L) 02/19/2019 1009   PLT 339 02/19/2019 1009   MCV 96.5 02/19/2019 1009   MCH 31.5 02/19/2019 1009   MCHC 32.6 02/19/2019 1009   RDW 22.0 (H) 02/19/2019 1009   LYMPHSABS 1.4 02/19/2019 1009   MONOABS 0.5 02/19/2019 1009   EOSABS 0.1 02/19/2019 1009   BASOSABS 0.0 02/19/2019 1009    Review of Systems: Constitutional:Denies fever. + weight  loss due to nerves. Also trying to walk about 2-3 miles per day Skin: No rash Cardiovascular: No chest pain, shortness of breath, or edema  Pulmonary: No cough  Gastro Intestinal: Reporting intermittent lower abdominal soreness.  No nausea, vomiting, constipation, + diarrhea reported. + early satiety Genitourinary: + pelvic pressure. Denies vaginal bleeding and discharge.  Musculoskeletal: Mild LBP   Psychology: Very nervous due to this diagnosis. She has xanax at home and has been taking .125 prn  Current Meds:  Outpatient Encounter Medications as of 03/01/2019  Medication Sig  . acetaminophen (TYLENOL) 500 MG tablet Take 500 mg by mouth every 6 (six) hours as needed for mild pain.  [name] Kitchen ALPRAZolam (XANAX) 0.25 MG tablet Take 1 tablet (0.25 mg  total) by mouth at bedtime as needed for anxiety. (Patient taking differently: Take 0.125 mg by mouth at bedtime as needed for anxiety. )  . carboxymethylcellulose (REFRESH PLUS) 0.5 % SOLN Place 1 drop into both eyes 2 (two) times daily as needed (dry eyes).  . Cholecalciferol (VITAMIN D3) 25 MCG (1000 UT) CAPS Take 1,000 Units by mouth daily.   [name] Kitchen dexamethasone (DECADRON) 4 MG tablet Take 1 tablet (4 mg total) by mouth daily. (Patient taking differently: Take 12 mg by mouth See admin instructions. Take 3 tablets the night before and 3 tablets in the morning prior to chemotherapy.)  . diphenhydrAMINE (BENADRYL) 25 mg capsule Take 25 mg by mouth at bedtime as needed for sleep.  [name] Kitchen enoxaparin (LOVENOX) 40 MG/0.4ML injection Inject 0.4 mLs (40 mg total) into the skin daily for 11 doses.  [name] Kitchen esomeprazole (NEXIUM) 20 MG capsule Take 20 mg by mouth daily.   . ferrous gluconate (FERGON) 324 MG tablet Take 1 tablet (324 mg total) by mouth 3 (three) times daily with meals.  [name] Kitchen ibuprofen (ADVIL) 800 MG tablet Take 1 tablet (800 mg total) by mouth every 8 (eight) hours as needed for moderate pain. For AFTER surgery  . lidocaine-prilocaine (EMLA) cream Apply to affected area once  . loratadine (CLARITIN) 10 MG tablet Take 10 mg by mouth daily as needed for allergies.  . Multiple Vitamin (MULTIVITAMIN WITH MINERALS) TABS tablet Take 1 tablet by mouth daily.  . ondansetron (ZOFRAN) 8 MG tablet Take 1 tablet (8 mg total) by mouth every 8 (eight) hours as needed for refractory nausea / vomiting.  [name] Kitchen oxyCODONE (OXY IR/ROXICODONE) 5 MG immediate release tablet Take 1 tablet (5 mg total) by mouth every 4 (four) hours as needed for severe pain. For AFTER surgery, do not take and drive  . polyethylene glycol (MIRALAX / GLYCOLAX) 17 g packet Take 17 g by mouth daily as needed for mild constipation.  . prochlorperazine (COMPAZINE) 10 MG tablet Take 1 tablet (10 mg total) by mouth every 6 (six) hours as needed (Nausea or  vomiting).  [name] Kitchen senna-docusate (SENOKOT-S) 8.6-50 MG tablet Take 2 tablets by mouth at bedtime. For AFTER surgery, do not take if having diarrhea  . traMADol (ULTRAM) 50 MG tablet Take 1 tablet (50 mg total) by mouth every 6 (six) hours as needed. (Patient taking differently: Take 50 mg by mouth every 6 (six) hours as needed for moderate pain. )   No facility-administered encounter medications on file as of 03/01/2019.     Allergy:  Allergies  Allergen Reactions  . Shrimp [Shellfish Allergy] Itching  . Sulfonamide Derivatives Itching and Nausea Only    Social Hx:   Social History   Socioeconomic History  . Marital status: Married  Spouse name: Not on file  . Number of children: 0  . Years of education: Not on file  . Highest education level: Not on file  Occupational History  . Occupation: Geographical information systems officer  . Financial resource strain: Not on file  . Food insecurity    Worry: Not on file    Inability: Not on file  . Transportation needs    Medical: Not on file    Non-medical: Not on file  Tobacco Use  . Smoking status: Former Smoker    Years: 10.00    Types: Cigarettes    Quit date: 05/23/1980    Years since quitting: 38.7  . Smokeless tobacco: Never Used  . Tobacco comment: quit 1984  Substance and Sexual Activity  . Alcohol use: Yes    Alcohol/week: 0.0 standard drinks    Comment: 2/day, 11-21-2018   no alcodholint he last month   . Drug use: No  . Sexual activity: Yes    Partners: Male  Lifestyle  . Physical activity    Days per week: Not on file    Minutes per session: Not on file  . Stress: Not on file  Relationships  . Social Herbalist on phone: Not on file    Gets together: Not on file    Attends religious service: Not on file    Active member of club or organization: Not on file    Attends meetings of clubs or organizations: Not on file    Relationship status: Not on file  . Intimate partner violence    Fear of current or ex partner: Not  on file    Emotionally abused: Not on file    Physically abused: Not on file    Forced sexual activity: Not on file  Other Topics Concern  . Not on file  Social History Narrative  . Not on file    Past Surgical Hx:  Past Surgical History:  Procedure Laterality Date  . CATARACT EXTRACTION, BILATERAL    . cystoscopy with instillation for cystitis  2001  . DEBULKING N/A 02/12/2019   Procedure: DEBULKING OF TUMOR;  Surgeon: Everitt Amber, MD;  Location: [facility];  Service: Gynecology;  Laterality: N/A;  . DILATION AND CURETTAGE OF UTERUS     miscarriage  . HYSTERECTOMY ABDOMINAL WITH SALPINGO-OOPHORECTOMY Bilateral 02/12/2019   Procedure: HYSTERECTOMY ABDOMINAL WITH SALPINGO-OOPHORECTOMY;  Surgeon: Everitt Amber, MD;  Location: [facility];  Service: Gynecology;  Laterality: Bilateral;  . IR IMAGING GUIDED PORT INSERTION  12/06/2018  . IR PARACENTESIS  12/03/2018  . IR PARACENTESIS  12/14/2018  . LAPAROSCOPY     adhesions  . LITHOTRIPSY  07/2013   x 2  . MYOMECTOMY  1992  . OMENTECTOMY N/A 02/12/2019   Procedure: OMENTECTOMY;  Surgeon: Everitt Amber, MD;  Location: [facility];  Service: Gynecology;  Laterality: N/A;  . PARACENTESIS  11/21/2018   abdominal , removed 3.1 Liters     Past Medical Hx:  Past Medical History:  Diagnosis Date  . Anxiety state, unspecified   . Diverticulosis   . Esophageal reflux   . Fibroid tumor   . Headache    due to allergies   . Hiatal hernia   . Hyperlipidemia   . IBS (irritable bowel syndrome)   . Internal hemorrhoids   . Nonspecific elevation of levels of transaminase or lactic acid dehydrogenase (LDH)   . Renal stone 07/2013  . Tubulovillous adenoma of colon   . Varicose veins  superficail thrombophlebitis (left LE)    Oncology Hx:  Oncology History Overview Note  Her 2 negative, MSI stable, serous   Ovarian cancer ([place])  11/09/2018 Imaging   1. 14.7 cm poorly defined soft tissue mass in central pelvis suspicious for primary ovarian carcinoma, with  uterine leiomyosarcoma considered a less likely differential diagnosis. 2. Moderate ascites and diffuse peritoneal carcinomatosis. 3. Several small uterine fibroids.   11/13/2018 Tumor Marker   Patient's tumor was tested for the following markers: CA-125 Results of the tumor marker test revealed 1434   11/21/2018 Procedure   Successful ultrasound-guided diagnostic and therapeutic paracentesis yielding 3.1 liters of peritoneal fluid   11/21/2018 Pathology Results   PERITONEAL/ASCITIC FLUID(SPECIMEN 1 OF 1 COLLECTED 11/21/18): ADENOCARCINOMA. Specimen Clinical Information Pelvic mass suspicious for ovarian cancer Source Peritoneal/Ascitic Fluid, (specimen 1 of 1 collected 11/21/18) Gross Specimen: Received is/are 1000 ccs of dark amber fluid. (CM:cm) Prepared: # Smears: 0 # Concentration Technique Slides (i.e. 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   11/29/2018 Initial Diagnosis   Ovarian cancer ([place])   11/29/2018 Cancer Staging   Staging form: Ovary, Fallopian Tube, and Primary Peritoneal Carcinoma, AJCC 8th Edition - Clinical: cT3, cN0, cM0 - Signed by [name] Lark, MD on 11/29/2018   12/03/2018 Procedure   Successful ultrasound-guided therapeutic paracentesis yielding 3.7 liters of peritoneal fluid.     12/06/2018 Procedure   Placement of a subcutaneous port device. Catheter tip at the SVC and right atrium junction   12/14/2018 Procedure   Successful ultrasound-guided paracentesis yielding 2.9 L of peritoneal fluid   12/28/2018 Tumor Marker   Patient's tumor was tested for the following markers: CA-125 Results of the tumor marker test revealed 812.   12/28/2018 -  Chemotherapy   The patient had carboplatin and taxol for chemotherapy treatment.     02/01/2019 Imaging   Ct abdomen and pelvis 8.6 cm left ovarian mass, corresponding to the patient's known primary neoplasm, improved.   Mild  peritoneal nodularity/omental caking, improved.   Small abdominopelvic ascites, improved.     02/01/2019 Tumor Marker   Patient's tumor was tested for the following markers: CA-125 Results of the tumor marker test revealed 183.   02/12/2019 Surgery   Preoperative Diagnosis: Stage IIIC ovarian cancer, s/p neoadjuvant chemotherapy      Procedure(s) Performed: 1. Exploratory laparotomy with total abdominal hysterectomy, bilateral salpingo-oophorectomy, omentectomy radical tumor debulking for ovarian cancer.   Surgeon: Thereasa Solo, MD.    Operative Findings: upper abdomen free of disease. No visible omental disease. Small volume (200cc) ascites. 8cm friable mass replacing left ovary and adherent to the sigmoid colon mesentery and ureter on the left.  Anterior fibroid. This represented an optimal cytoreduction (R0) with no gross visible disease remaining.    02/12/2019 Pathology Results   A. OVARY AND FALLOPIAN TUBE, LEFT, SALPINGO-OOPHORECTOMY: - Serous carcinoma, high grade, status post neoadjuvant therapy - See oncology table and comment below B. UTERUS CERVIX WITH RIGHT FALLOPIAN TUBE AND OVARY, HYSTERECTOMY: Uterus: - Serosal surface involved by serous carcinoma - Endomyometrium uninvolved by carcinoma - Benign endometrial polyp (4.1 cm) - Leiomyomata (5.5 cm; largest) - Adenomyosis Cervix: - Uninvolved by carcinoma Left ovary: - Serous carcinoma, high grade Left fallopian tube: - Serous carcinoma, high grade C. SOFT TISSUE, LEFT PELVIC SIDEWALL TUMOR, EXCISION: - Metastatic serous carcinoma, high-grade D. SOFT TISSUE, SIGMOID COLON MESENTERY, EXCISION: - Metastatic serous carcinoma, high-grade E. OMENTUM, TUMOR RESECTION: - Metastatic  serous carcinoma, high-grade OVARY or FALLOPIAN TUBE or PRIMARY PERITONEUM: Procedure: Total hysterectomy and bilateral salpingo-oophorectomy, omentectomy and peritoneal biopsies Specimen Integrity: Fragmented Tumor Site: Left ovary and  fallopian tube Ovarian Surface Involvement: Present Fallopian Tube Surface Involvement: Present Tumor Size: 6.3 cm (see comment) Histologic Type: Serous carcinoma Histologic Grade: High-grade Implants: Not applicable Other Tissue/ Organ Involvement: Right ovary, right fallopian tube, omentum, mesentery Largest Extrapelvic Peritoneal Focus: Macroscopic Peritoneal/Ascitic Fluid: Malignant Treatment Effect: No definite or minimal response identified (chemotherapy response score 1 [CRS1] Regional Lymph Nodes: No lymph nodes submitted or found Pathologic Stage Classification (pTNM, AJCC 8th Edition): pT3c, pNX Representative Tumor Block: A4 Comment(s): Additional testing (HER-2, MMR and MSI) are pending. The primary tumor site appears to be the left ovary and fallopian tube. The uterus is only involved on the serosal surface. There is tumor on the anterior peritoneal reflection.  Addendum: Tumor is Her2 negative,MSI stable   02/19/2019 Tumor Marker   Patient's tumor was tested for the following markers: CA-125 Results of the tumor marker test revealed 40.3     Family Hx:  Family History  Problem Relation Age of Onset  . Lymphoma Father   . Heart disease Father   . Hypertension Father   . Hypertension Mother   . Hyperlipidemia Mother   . Breast cancer Mother 79  . Diabetes Maternal Grandmother   . Uterine cancer Maternal Grandmother   . Stomach cancer Paternal Grandfather   . Colon cancer Neg Hx     Vitals:  Blood pressure 136/68, pulse 82, temperature 98 F (36.7 C), temperature source Temporal, resp. rate 16, height '5\' 2"'$  (1.575 m), weight 132 lb 2 oz (59.9 kg), SpO2 100 %.  Physical Exam: Well-nourished well-developed female in no acute distress.  Neck: Supple, no lymphadenopathy, no thyromegaly.  Abdomen:  Soft abdomen, incision well healed, no ascites, no masses. No infection.   Groins: No lymphadenopathy.  Extremities: No edema.  Pelvic: vaginal cuff smooth and in  tact, no lesions no palpable masses.   30 minutes of direct face to face counseling time was spent with the patient. This included discussion about prognosis, therapy recommendations and postoperative side effects and are beyond the scope of routine postoperative care.    Thereasa Solo, MD 03/01/2019, 3:31 PM

## 2019-03-05 ENCOUNTER — Encounter: Payer: Self-pay | Admitting: Hematology and Oncology

## 2019-03-05 ENCOUNTER — Inpatient Hospital Stay (HOSPITAL_BASED_OUTPATIENT_CLINIC_OR_DEPARTMENT_OTHER): Payer: Managed Care, Other (non HMO) | Admitting: Hematology and Oncology

## 2019-03-05 ENCOUNTER — Telehealth: Payer: Self-pay | Admitting: Hematology and Oncology

## 2019-03-05 ENCOUNTER — Other Ambulatory Visit: Payer: Self-pay

## 2019-03-05 VITALS — BP 126/70 | HR 85 | Temp 99.4°F | Resp 18 | Ht 62.0 in | Wt 132.6 lb

## 2019-03-05 DIAGNOSIS — C562 Malignant neoplasm of left ovary: Secondary | ICD-10-CM

## 2019-03-05 DIAGNOSIS — Z23 Encounter for immunization: Secondary | ICD-10-CM

## 2019-03-05 DIAGNOSIS — G620 Drug-induced polyneuropathy: Secondary | ICD-10-CM

## 2019-03-05 DIAGNOSIS — D6481 Anemia due to antineoplastic chemotherapy: Secondary | ICD-10-CM | POA: Diagnosis not present

## 2019-03-05 DIAGNOSIS — T451X5A Adverse effect of antineoplastic and immunosuppressive drugs, initial encounter: Secondary | ICD-10-CM

## 2019-03-05 DIAGNOSIS — Z5111 Encounter for antineoplastic chemotherapy: Secondary | ICD-10-CM | POA: Diagnosis not present

## 2019-03-05 DIAGNOSIS — Z299 Encounter for prophylactic measures, unspecified: Secondary | ICD-10-CM

## 2019-03-05 MED ORDER — INFLUENZA VAC SPLIT QUAD 0.5 ML IM SUSY
PREFILLED_SYRINGE | INTRAMUSCULAR | Status: AC
  Filled 2019-03-05: qty 0.5

## 2019-03-05 MED ORDER — INFLUENZA VAC SPLIT QUAD 0.5 ML IM SUSY
0.5000 mL | PREFILLED_SYRINGE | Freq: Once | INTRAMUSCULAR | Status: AC
  Administered 2019-03-05: 0.5 mL via INTRAMUSCULAR

## 2019-03-05 NOTE — Assessment & Plan Note (Signed)
She has multifactorial anemia, likely anemia secondary to chemotherapy and recent blood loss She is having some GI disturbances from oral iron supplement I recommend discontinuation of oral iron supplement and focus on iron rich diet I will recheck iron panel in 2 days

## 2019-03-05 NOTE — Progress Notes (Signed)
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[facility]) I reviewed plan of care with the patient She has recovered nicely from surgery We will resume chemotherapy next week She is scheduled to see genetic counselor in 2 days time The plan will be to proceed with 3 more cycles of treatment after surgery and then repeat CT imaging I will see her back prior to cycle 5 of treatment  Anemia due to antineoplastic chemotherapy She has multifactorial anemia, likely anemia secondary to chemotherapy and recent blood loss She is having some GI disturbances from oral iron supplement I recommend discontinuation of oral iron supplement and focus on iron rich diet I will recheck iron panel in 2 days  Peripheral neuropathy due to chemotherapy [facility]) She has grade 1 neuropathy from recent treatment We will observe closely For now, I do not plan to adjust the dose of her treatment  Preventive measure We discussed the importance of preventive care and reviewed the vaccination programs. She does not have any prior allergic reactions to influenza vaccination. She agrees to proceed with influenza vaccination today and we will administer it today at the clinic.    Orders Placed This Encounter  Procedures  . Ferritin    Standing Status:   Future    Standing Expiration Date:   03/04/2020  . Iron and TIBC    Standing Status:   Future    Standing Expiration Date:   04/08/2020    INTERVAL HISTORY: Please see below for problem oriented charting. She returns for further follow-up Since surgery, she is recovering well She is starting to gain some weight Her wound is healing well She was recommended to take oral iron supplement but it causes some bloating and changes in bowel habits She continues to have very mild peripheral neuropathy affecting her toes She also have recent  numbness on her right facial nerve/exacerbation of trigeminal neuralgia but that has since resolved No recent infection, fever or chills  SUMMARY OF ONCOLOGIC HISTORY: Oncology History Overview Note  Her 2 negative, MSI stable, serous   Left ovarian epithelial cancer ([place])  11/09/2018 Imaging   1. 14.7 cm poorly defined soft tissue mass in central pelvis suspicious for primary ovarian carcinoma, with uterine leiomyosarcoma considered a less likely differential diagnosis. 2. Moderate ascites and diffuse peritoneal carcinomatosis. 3. Several small uterine fibroids.   11/13/2018 Tumor Marker   Patient's tumor was tested for the following markers: CA-125 Results of the tumor marker test revealed 1434   11/21/2018 Procedure   Successful ultrasound-guided diagnostic and therapeutic paracentesis yielding 3.1 liters of peritoneal fluid   11/21/2018 Pathology Results   PERITONEAL/ASCITIC FLUID(SPECIMEN 1 OF 1 COLLECTED 11/21/18): ADENOCARCINOMA. Specimen Clinical Information Pelvic mass suspicious for ovarian cancer Source Peritoneal/Ascitic Fluid, (specimen 1 of 1 collected 11/21/18) Gross Specimen: Received is/are 1000 ccs of dark amber fluid. (CM:cm) Prepared: # Smears: 0 # Concentration Technique Slides (i.e. 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   11/29/2018 Initial Diagnosis   Ovarian cancer ([place])   11/29/2018 Cancer Staging   Staging form: Ovary, Fallopian Tube, and Primary Peritoneal Carcinoma, AJCC 8th Edition - Clinical: cT3, cN0, cM0 - Signed by [name] Lark, MD on 11/29/2018   12/03/2018 Procedure   Successful ultrasound-guided therapeutic paracentesis yielding 3.7 liters of peritoneal fluid.     12/06/2018 Procedure  Placement of a subcutaneous port device. Catheter tip at the SVC and right atrium junction   12/14/2018 Procedure   Successful ultrasound-guided paracentesis yielding  2.9 L of peritoneal fluid   12/28/2018 Tumor Marker   Patient's tumor was tested for the following markers: CA-125 Results of the tumor marker test revealed 812.   12/28/2018 -  Chemotherapy   The patient had carboplatin and taxol for chemotherapy treatment.     02/01/2019 Imaging   Ct abdomen and pelvis 8.6 cm left ovarian mass, corresponding to the patient's known primary neoplasm, improved.   Mild peritoneal nodularity/omental caking, improved.   Small abdominopelvic ascites, improved.     02/01/2019 Tumor Marker   Patient's tumor was tested for the following markers: CA-125 Results of the tumor marker test revealed 183.   02/12/2019 Surgery   Preoperative Diagnosis: Stage IIIC ovarian cancer, s/p neoadjuvant chemotherapy      Procedure(s) Performed: 1. Exploratory laparotomy with total abdominal hysterectomy, bilateral salpingo-oophorectomy, omentectomy radical tumor debulking for ovarian cancer.   Surgeon: Thereasa Solo, MD.    Operative Findings: upper abdomen free of disease. No visible omental disease. Small volume (200cc) ascites. 8cm friable mass replacing left ovary and adherent to the sigmoid colon mesentery and ureter on the left.  Anterior fibroid. This represented an optimal cytoreduction (R0) with no gross visible disease remaining.    02/12/2019 Pathology Results   A. OVARY AND FALLOPIAN TUBE, LEFT, SALPINGO-OOPHORECTOMY: - Serous carcinoma, high grade, status post neoadjuvant therapy - See oncology table and comment below B. UTERUS CERVIX WITH RIGHT FALLOPIAN TUBE AND OVARY, HYSTERECTOMY: Uterus: - Serosal surface involved by serous carcinoma - Endomyometrium uninvolved by carcinoma - Benign endometrial polyp (4.1 cm) - Leiomyomata (5.5 cm; largest) - Adenomyosis Cervix: - Uninvolved by carcinoma Left ovary: - Serous carcinoma, high grade Left fallopian tube: - Serous carcinoma, high grade C. SOFT TISSUE, LEFT PELVIC SIDEWALL TUMOR, EXCISION: - Metastatic  serous carcinoma, high-grade D. SOFT TISSUE, SIGMOID COLON MESENTERY, EXCISION: - Metastatic serous carcinoma, high-grade E. OMENTUM, TUMOR RESECTION: - Metastatic serous carcinoma, high-grade OVARY or FALLOPIAN TUBE or PRIMARY PERITONEUM: Procedure: Total hysterectomy and bilateral salpingo-oophorectomy, omentectomy and peritoneal biopsies Specimen Integrity: Fragmented Tumor Site: Left ovary and fallopian tube Ovarian Surface Involvement: Present Fallopian Tube Surface Involvement: Present Tumor Size: 6.3 cm (see comment) Histologic Type: Serous carcinoma Histologic Grade: High-grade Implants: Not applicable Other Tissue/ Organ Involvement: Right ovary, right fallopian tube, omentum, mesentery Largest Extrapelvic Peritoneal Focus: Macroscopic Peritoneal/Ascitic Fluid: Malignant Treatment Effect: No definite or minimal response identified (chemotherapy response score 1 [CRS1] Regional Lymph Nodes: No lymph nodes submitted or found Pathologic Stage Classification (pTNM, AJCC 8th Edition): pT3c, pNX Representative Tumor Block: A4 Comment(s): Additional testing (HER-2, MMR and MSI) are pending. The primary tumor site appears to be the left ovary and fallopian tube. The uterus is only involved on the serosal surface. There is tumor on the anterior peritoneal reflection.  Addendum: Tumor is Her2 negative,MSI stable   02/19/2019 Tumor Marker   Patient's tumor was tested for the following markers: CA-125 Results of the tumor marker test revealed 40.3     REVIEW OF SYSTEMS:   Constitutional: Denies fevers, chills or abnormal weight loss Eyes: Denies blurriness of vision Ears, nose, mouth, throat, and face: Denies mucositis or sore throat Respiratory: Denies cough, dyspnea or wheezes Cardiovascular: Denies palpitation, chest discomfort or lower extremity swelling Skin: Denies abnormal skin rashes Lymphatics: Denies new lymphadenopathy or easy bruising Neurological:Denies numbness,  tingling or new weaknesses Behavioral/Psych: Mood is  stable, no new changes  All other systems were reviewed with the patient and are negative.  I have reviewed the past medical history, past surgical history, social history and family history with the patient and they are unchanged from previous note.  ALLERGIES:  is allergic to shrimp [shellfish allergy] and sulfonamide derivatives.  MEDICATIONS:  Current Outpatient Medications  Medication Sig Dispense Refill  . acetaminophen (TYLENOL) 500 MG tablet Take 500 mg by mouth every 6 (six) hours as needed for mild pain.    [name] Kitchen ALPRAZolam (XANAX) 0.25 MG tablet Take 1 tablet (0.25 mg total) by mouth at bedtime as needed for anxiety. (Patient taking differently: Take 0.125 mg by mouth at bedtime as needed for anxiety. ) 15 tablet 0  . carboxymethylcellulose (REFRESH PLUS) 0.5 % SOLN Place 1 drop into both eyes 2 (two) times daily as needed (dry eyes).    . Cholecalciferol (VITAMIN D3) 25 MCG (1000 UT) CAPS Take 1,000 Units by mouth daily.     [name] Kitchen dexamethasone (DECADRON) 4 MG tablet Take 1 tablet (4 mg total) by mouth daily. (Patient taking differently: Take 12 mg by mouth See admin instructions. Take 3 tablets the night before and 3 tablets in the morning prior to chemotherapy.) 24 tablet 0  . diphenhydrAMINE (BENADRYL) 25 mg capsule Take 25 mg by mouth at bedtime as needed for sleep.    [name] Kitchen esomeprazole (NEXIUM) 20 MG capsule Take 20 mg by mouth daily.     [name] Kitchen ibuprofen (ADVIL) 800 MG tablet Take 1 tablet (800 mg total) by mouth every 8 (eight) hours as needed for moderate pain. For AFTER surgery 30 tablet 0  . lidocaine-prilocaine (EMLA) cream Apply to affected area once 30 g 3  . loratadine (CLARITIN) 10 MG tablet Take 10 mg by mouth daily as needed for allergies.    . Multiple Vitamin (MULTIVITAMIN WITH MINERALS) TABS tablet Take 1 tablet by mouth daily.    . ondansetron (ZOFRAN) 8 MG tablet Take 1 tablet (8 mg total) by mouth every 8 (eight) hours as  needed for refractory nausea / vomiting. 30 tablet 1  . oxyCODONE (OXY IR/ROXICODONE) 5 MG immediate release tablet Take 1 tablet (5 mg total) by mouth every 4 (four) hours as needed for severe pain. For AFTER surgery, do not take and drive 15 tablet 0  . polyethylene glycol (MIRALAX / GLYCOLAX) 17 g packet Take 17 g by mouth daily as needed for mild constipation.    . prochlorperazine (COMPAZINE) 10 MG tablet Take 1 tablet (10 mg total) by mouth every 6 (six) hours as needed (Nausea or vomiting). 30 tablet 1  . senna-docusate (SENOKOT-S) 8.6-50 MG tablet Take 2 tablets by mouth at bedtime. For AFTER surgery, do not take if having diarrhea 30 tablet 1  . traMADol (ULTRAM) 50 MG tablet Take 1 tablet (50 mg total) by mouth every 6 (six) hours as needed. (Patient taking differently: Take 50 mg by mouth every 6 (six) hours as needed for moderate pain. ) 30 tablet 0   No current facility-administered medications for this visit.     PHYSICAL EXAMINATION: ECOG PERFORMANCE STATUS: 1 - Symptomatic but completely ambulatory  Vitals:   03/05/19 1015  BP: 126/70  Pulse: 85  Resp: 18  Temp: 99.4 F (37.4 C)  SpO2: 100%   Filed Weights   03/05/19 1015  Weight: 132 lb 9.6 oz (60.1 kg)    GENERAL:alert, no distress and comfortable.   SKIN: skin color, texture, turgor are normal, no rashes or  significant lesions EYES: normal, Conjunctiva are pink and non-injected, sclera clear OROPHARYNX:no exudate, no erythema and lips, buccal mucosa, and tongue normal  NECK: supple, thyroid normal size, non-tender, without nodularity LYMPH:  no palpable lymphadenopathy in the cervical, axillary or inguinal LUNGS: clear to auscultation and percussion with normal breathing effort HEART: regular rate & rhythm and no murmurs and no lower extremity edema ABDOMEN:abdomen soft, non-tender and normal bowel sounds. Noted well-healed surgical scar Musculoskeletal:no cyanosis of digits and no clubbing  NEURO: alert &  oriented x 3 with fluent speech, no focal motor/sensory deficits  LABORATORY DATA:  I have reviewed the data as listed    Component Value Date/Time   NA 142 02/19/2019 1009   K 3.7 02/19/2019 1009   CL 108 02/19/2019 1009   CO2 26 02/19/2019 1009   GLUCOSE 116 (H) 02/19/2019 1009   BUN 10 02/19/2019 1009   CREATININE 0.60 02/19/2019 1009   CALCIUM 9.1 02/19/2019 1009   PROT 6.4 (L) 02/19/2019 1009   ALBUMIN 3.7 02/19/2019 1009   AST 38 02/19/2019 1009   ALT 53 (H) 02/19/2019 1009   ALKPHOS 365 (H) 02/19/2019 1009   BILITOT 0.6 02/19/2019 1009   GFRNONAA >60 02/19/2019 1009   GFRAA >60 02/19/2019 1009    No results found for: SPEP, UPEP  Lab Results  Component Value Date   WBC 5.8 02/19/2019   NEUTROABS 3.8 02/19/2019   HGB 9.1 (L) 02/19/2019   HCT 27.9 (L) 02/19/2019   MCV 96.5 02/19/2019   PLT 339 02/19/2019      Chemistry      Component Value Date/Time   NA 142 02/19/2019 1009   K 3.7 02/19/2019 1009   CL 108 02/19/2019 1009   CO2 26 02/19/2019 1009   BUN 10 02/19/2019 1009   CREATININE 0.60 02/19/2019 1009      Component Value Date/Time   CALCIUM 9.1 02/19/2019 1009   ALKPHOS 365 (H) 02/19/2019 1009   AST 38 02/19/2019 1009   ALT 53 (H) 02/19/2019 1009   BILITOT 0.6 02/19/2019 1009     All questions were answered. The patient knows to call the clinic with any problems, questions or concerns. No barriers to learning was detected.  I spent 25 minutes counseling the patient face to face. The total time spent in the appointment was 30 minutes and more than 50% was on counseling and review of test results  [name] Lark, MD 03/05/2019 12:01 PM

## 2019-03-05 NOTE — Assessment & Plan Note (Signed)
We discussed the importance of preventive care and reviewed the vaccination programs. She does not have any prior allergic reactions to influenza vaccination. She agrees to proceed with influenza vaccination today and we will administer it today at the clinic.  

## 2019-03-05 NOTE — Assessment & Plan Note (Signed)
I reviewed plan of care with the patient She has recovered nicely from surgery We will resume chemotherapy next week She is scheduled to see genetic counselor in 2 days time The plan will be to proceed with 3 more cycles of treatment after surgery and then repeat CT imaging I will see her back prior to cycle 5 of treatment

## 2019-03-05 NOTE — Assessment & Plan Note (Signed)
She has grade 1 neuropathy from recent treatment We will observe closely For now, I do not plan to adjust the dose of her treatment

## 2019-03-05 NOTE — Telephone Encounter (Signed)
I could not reach patient regarding schedule  °

## 2019-03-06 ENCOUNTER — Other Ambulatory Visit: Payer: Self-pay | Admitting: Licensed Clinical Social Worker

## 2019-03-06 ENCOUNTER — Telehealth: Payer: Self-pay | Admitting: Licensed Clinical Social Worker

## 2019-03-06 DIAGNOSIS — C562 Malignant neoplasm of left ovary: Secondary | ICD-10-CM

## 2019-03-06 NOTE — Telephone Encounter (Signed)
Called patient regarding upcoming [facility] appointment, per patient's request this will be a walk-in visit. °

## 2019-03-07 ENCOUNTER — Inpatient Hospital Stay (HOSPITAL_BASED_OUTPATIENT_CLINIC_OR_DEPARTMENT_OTHER): Payer: Managed Care, Other (non HMO) | Admitting: Licensed Clinical Social Worker

## 2019-03-07 ENCOUNTER — Other Ambulatory Visit: Payer: Self-pay

## 2019-03-07 ENCOUNTER — Inpatient Hospital Stay: Payer: Managed Care, Other (non HMO)

## 2019-03-07 ENCOUNTER — Encounter: Payer: Self-pay | Admitting: Licensed Clinical Social Worker

## 2019-03-07 DIAGNOSIS — T451X5A Adverse effect of antineoplastic and immunosuppressive drugs, initial encounter: Secondary | ICD-10-CM

## 2019-03-07 DIAGNOSIS — Z8049 Family history of malignant neoplasm of other genital organs: Secondary | ICD-10-CM | POA: Diagnosis not present

## 2019-03-07 DIAGNOSIS — C562 Malignant neoplasm of left ovary: Secondary | ICD-10-CM

## 2019-03-07 DIAGNOSIS — Z800 Family history of malignant neoplasm of digestive organs: Secondary | ICD-10-CM

## 2019-03-07 DIAGNOSIS — Z803 Family history of malignant neoplasm of breast: Secondary | ICD-10-CM | POA: Insufficient documentation

## 2019-03-07 DIAGNOSIS — C569 Malignant neoplasm of unspecified ovary: Secondary | ICD-10-CM

## 2019-03-07 DIAGNOSIS — Z807 Family history of other malignant neoplasms of lymphoid, hematopoietic and related tissues: Secondary | ICD-10-CM | POA: Diagnosis not present

## 2019-03-07 DIAGNOSIS — D6481 Anemia due to antineoplastic chemotherapy: Secondary | ICD-10-CM

## 2019-03-07 DIAGNOSIS — Z5111 Encounter for antineoplastic chemotherapy: Secondary | ICD-10-CM | POA: Diagnosis not present

## 2019-03-07 LAB — CMP (CANCER CENTER ONLY)
ALT: 32 U/L (ref 0–44)
AST: 27 U/L (ref 15–41)
Albumin: 4 g/dL (ref 3.5–5.0)
Alkaline Phosphatase: 162 U/L — ABNORMAL HIGH (ref 38–126)
Anion gap: 9 (ref 5–15)
BUN: 12 mg/dL (ref 8–23)
CO2: 28 mmol/L (ref 22–32)
Calcium: 9.4 mg/dL (ref 8.9–10.3)
Chloride: 105 mmol/L (ref 98–111)
Creatinine: 0.6 mg/dL (ref 0.44–1.00)
GFR, Est AFR Am: >60 mL/min (ref >60)
GFR, Estimated: >60 mL/min (ref >60)
Glucose, Bld: 92 mg/dL (ref 70–99)
Potassium: 3.9 mmol/L (ref 3.5–5.1)
Sodium: 142 mmol/L (ref 135–145)
Total Bilirubin: 0.9 mg/dL (ref 0.3–1.2)
Total Protein: 6.7 g/dL (ref 6.5–8.1)

## 2019-03-07 LAB — CBC WITH DIFFERENTIAL (CANCER CENTER ONLY)
Abs Immature Granulocytes: 0.01 10*3/uL (ref 0.00–0.07)
Basophils Absolute: 0 10*3/uL (ref 0.0–0.1)
Basophils Relative: 1 %
Eosinophils Absolute: 0.1 10*3/uL (ref 0.0–0.5)
Eosinophils Relative: 2 %
HCT: 34.7 % — ABNORMAL LOW (ref 36.0–46.0)
Hemoglobin: 11.3 g/dL — ABNORMAL LOW (ref 12.0–15.0)
Immature Granulocytes: 0 %
Lymphocytes Relative: 32 %
Lymphs Abs: 1.8 10*3/uL (ref 0.7–4.0)
MCH: 32.2 pg (ref 26.0–34.0)
MCHC: 32.6 g/dL (ref 30.0–36.0)
MCV: 98.9 fL (ref 80.0–100.0)
Monocytes Absolute: 0.5 10*3/uL (ref 0.1–1.0)
Monocytes Relative: 9 %
Neutro Abs: 3.1 10*3/uL (ref 1.7–7.7)
Neutrophils Relative %: 56 %
Platelet Count: 278 10*3/uL (ref 150–400)
RBC: 3.51 MIL/uL — ABNORMAL LOW (ref 3.87–5.11)
RDW: 16.2 % — ABNORMAL HIGH (ref 11.5–15.5)
WBC Count: 5.5 10*3/uL (ref 4.0–10.5)
nRBC: 0 % (ref 0.0–0.2)

## 2019-03-07 LAB — IRON AND TIBC
Iron: 53 ug/dL (ref 41–142)
Saturation Ratios: 17 % — ABNORMAL LOW (ref 21–57)
TIBC: 320 ug/dL (ref 236–444)
UIBC: 267 ug/dL (ref 120–384)

## 2019-03-07 LAB — FERRITIN: Ferritin: 136 ng/mL (ref 11–307)

## 2019-03-07 NOTE — Progress Notes (Signed)
REFERRING PROVIDER: [name] Lark, MD [place],  [place] [address]  PRIMARY PROVIDER:  Burnard Bunting, MD  PRIMARY REASON FOR VISIT:  1. 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 Family history of breast cancer   3. Family history of uterine cancer   4. Family history of lymphoma   5. Family history of stomach cancer      HISTORY OF PRESENT ILLNESS:   Stacy Rivas, a 64 y.o. female, was seen for a  cancer genetics consultation at the request of Dr. Alvy Bimler due to a personal and family history of cancer.  Stacy Rivas presents to clinic today to discuss the possibility of a hereditary predisposition to cancer, genetic testing, and to further clarify her future cancer risks, as well as potential cancer risks for family members.   In 2020, at the age of 79, Stacy Rivas was diagnosed with ovarian cancer, high grade serous carcinoma. Thus far, this has been treated with chemotherapy and she had surgery on 02/19/2019, and she will start chemotherapy again next week.   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 14.7 cm poorly defined soft tissue mass in central pelvis suspicious for primary ovarian carcinoma, with uterine leiomyosarcoma considered a less likely differential diagnosis. 2. Moderate ascites and diffuse peritoneal carcinomatosis. 3. Several small uterine fibroids.   11/13/2018 Tumor Marker   Patient's tumor was tested for the following markers: CA-125 Results of the tumor marker test revealed 1434   11/21/2018 Procedure   Successful ultrasound-guided diagnostic and therapeutic paracentesis yielding 3.1 liters of peritoneal fluid   11/21/2018 Pathology Results   PERITONEAL/ASCITIC FLUID(SPECIMEN 1 OF 1 COLLECTED 11/21/18): ADENOCARCINOMA. Specimen Clinical Information Pelvic mass suspicious for ovarian cancer Source Peritoneal/Ascitic  Fluid, (specimen 1 of 1 collected 11/21/18) Gross Specimen: Received is/are 1000 ccs of dark amber fluid. (CM:cm) Prepared: # Smears: 0 # Concentration Technique Slides (i.e. 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   11/29/2018 Initial Diagnosis   Ovarian cancer ([place])   11/29/2018 Cancer Staging   Staging form: Ovary, Fallopian Tube, and Primary Peritoneal Carcinoma, AJCC 8th Edition - Clinical: cT3, cN0, cM0 - Signed by [name] Lark, MD on 11/29/2018   12/03/2018 Procedure   Successful ultrasound-guided therapeutic paracentesis yielding 3.7 liters of peritoneal fluid.     12/06/2018 Procedure   Placement of a subcutaneous port device. Catheter tip at the SVC and right atrium junction   12/14/2018 Procedure   Successful ultrasound-guided paracentesis yielding 2.9 L of peritoneal fluid   12/28/2018 Tumor Marker   Patient's tumor was tested for the following markers: CA-125 Results of the tumor marker test revealed 812.   12/28/2018 -  Chemotherapy   The patient had carboplatin and taxol for chemotherapy treatment.     02/01/2019 Imaging   Ct abdomen and pelvis 8.6 cm left ovarian mass, corresponding to the patient's known primary neoplasm, improved.   Mild peritoneal nodularity/omental caking, improved.   Small abdominopelvic ascites, improved.     02/01/2019 Tumor Marker   Patient's tumor was tested for the following markers: CA-125 Results of the tumor marker test revealed 183.   02/12/2019 Surgery   Preoperative Diagnosis: Stage IIIC ovarian cancer, s/p neoadjuvant chemotherapy      Procedure(s) Performed: 1. Exploratory laparotomy with total abdominal hysterectomy, bilateral salpingo-oophorectomy, omentectomy radical tumor debulking for  ovarian cancer.   Surgeon: Thereasa Solo, MD.    Operative Findings: upper abdomen free of disease. No visible omental disease. Small volume (200cc)  ascites. 8cm friable mass replacing left ovary and adherent to the sigmoid colon mesentery and ureter on the left.  Anterior fibroid. This represented an optimal cytoreduction (R0) with no gross visible disease remaining.    02/12/2019 Pathology Results   A. OVARY AND FALLOPIAN TUBE, LEFT, SALPINGO-OOPHORECTOMY: - Serous carcinoma, high grade, status post neoadjuvant therapy - See oncology table and comment below B. UTERUS CERVIX WITH RIGHT FALLOPIAN TUBE AND OVARY, HYSTERECTOMY: Uterus: - Serosal surface involved by serous carcinoma - Endomyometrium uninvolved by carcinoma - Benign endometrial polyp (4.1 cm) - Leiomyomata (5.5 cm; largest) - Adenomyosis Cervix: - Uninvolved by carcinoma Left ovary: - Serous carcinoma, high grade Left fallopian tube: - Serous carcinoma, high grade C. SOFT TISSUE, LEFT PELVIC SIDEWALL TUMOR, EXCISION: - Metastatic serous carcinoma, high-grade D. SOFT TISSUE, SIGMOID COLON MESENTERY, EXCISION: - Metastatic serous carcinoma, high-grade E. OMENTUM, TUMOR RESECTION: - Metastatic serous carcinoma, high-grade OVARY or FALLOPIAN TUBE or PRIMARY PERITONEUM: Procedure: Total hysterectomy and bilateral salpingo-oophorectomy, omentectomy and peritoneal biopsies Specimen Integrity: Fragmented Tumor Site: Left ovary and fallopian tube Ovarian Surface Involvement: Present Fallopian Tube Surface Involvement: Present Tumor Size: 6.3 cm (see comment) Histologic Type: Serous carcinoma Histologic Grade: High-grade Implants: Not applicable Other Tissue/ Organ Involvement: Right ovary, right fallopian tube, omentum, mesentery Largest Extrapelvic Peritoneal Focus: Macroscopic Peritoneal/Ascitic Fluid: Malignant Treatment Effect: No definite or minimal response identified (chemotherapy response score 1 [CRS1] Regional Lymph Nodes: No lymph nodes submitted or found Pathologic Stage Classification (pTNM, AJCC 8th Edition): pT3c, pNX Representative Tumor Block:  A4 Comment(s): Additional testing (HER-2, MMR and MSI) are pending. The primary tumor site appears to be the left ovary and fallopian tube. The uterus is only involved on the serosal surface. There is tumor on the anterior peritoneal reflection.  Addendum: Tumor is Her2 negative,MSI stable   02/19/2019 Tumor Marker   Patient's tumor was tested for the following markers: CA-125 Results of the tumor marker test revealed 40.3     RISK FACTORS:  Menarche was at age 41.  First live birth at age no children.  OCP use for approximately 10 years.  Ovaries intact: no.  Hysterectomy: yes.  Menopausal status: postmenopausal.  HRT use: 0 years. Colonoscopy: yes; normal. Mammogram within the last year: yes. Any excessive radiation exposure in the past: no  Past Medical History:  Diagnosis Date  . Anxiety state, unspecified   . Diverticulosis   . Esophageal reflux   . Family history of breast cancer   . Family history of lymphoma   . Family history of stomach cancer   . Family history of uterine cancer   . Fibroid tumor   . Headache    due to allergies   . Hiatal hernia   . Hyperlipidemia   . IBS (irritable bowel syndrome)   . Internal hemorrhoids   . Nonspecific elevation of levels of transaminase or lactic acid dehydrogenase (LDH)   . Renal stone 07/2013  . Tubulovillous adenoma of colon   . Varicose veins    superficail thrombophlebitis (left LE)    Past Surgical History:  Procedure Laterality Date  . CATARACT EXTRACTION, BILATERAL    . cystoscopy with instillation for cystitis  2001  . DEBULKING N/A 02/12/2019   Procedure: DEBULKING OF TUMOR;  Surgeon: Everitt Amber, MD;  Location: [facility];  Service: Gynecology;  Laterality: N/A;  . DILATION  AND CURETTAGE OF UTERUS     miscarriage  . HYSTERECTOMY ABDOMINAL WITH SALPINGO-OOPHORECTOMY Bilateral 02/12/2019   Procedure: HYSTERECTOMY ABDOMINAL WITH SALPINGO-OOPHORECTOMY;  Surgeon: Everitt Amber, MD;  Location: [facility];  Service:  Gynecology;  Laterality: Bilateral;  . IR IMAGING GUIDED PORT INSERTION  12/06/2018  . IR PARACENTESIS  12/03/2018  . IR PARACENTESIS  12/14/2018  . LAPAROSCOPY     adhesions  . LITHOTRIPSY  07/2013   x 2  . MYOMECTOMY  1992  . OMENTECTOMY N/A 02/12/2019   Procedure: OMENTECTOMY;  Surgeon: Everitt Amber, MD;  Location: [facility];  Service: Gynecology;  Laterality: N/A;  . PARACENTESIS  11/21/2018   abdominal , removed 3.1 Liters     Social History   Socioeconomic History  . Marital status: Married    Spouse name: Not on file  . Number of children: 0  . Years of education: Not on file  . Highest education level: Not on file  Occupational History  . Occupation: Geographical information systems officer  . Financial resource strain: Not on file  . Food insecurity    Worry: Not on file    Inability: Not on file  . Transportation needs    Medical: Not on file    Non-medical: Not on file  Tobacco Use  . Smoking status: Former Smoker    Years: 10.00    Types: Cigarettes    Quit date: 05/23/1980    Years since quitting: 38.8  . Smokeless tobacco: Never Used  . Tobacco comment: quit 1984  Substance and Sexual Activity  . Alcohol use: Yes    Alcohol/week: 0.0 standard drinks    Comment: 2/day, 11-21-2018   no alcodholint he last month   . Drug use: No  . Sexual activity: Yes    Partners: Male  Lifestyle  . Physical activity    Days per week: Not on file    Minutes per session: Not on file  . Stress: Not on file  Relationships  . Social Herbalist on phone: Not on file    Gets together: Not on file    Attends religious service: Not on file    Active member of club or organization: Not on file    Attends meetings of clubs or organizations: Not on file    Relationship status: Not on file  Other Topics Concern  . Not on file  Social History Narrative  . Not on file     FAMILY HISTORY:  We obtained a detailed, 4-generation family history.  Significant diagnoses are listed below: Family  History  Problem Relation Age of Onset  . Lymphoma Father   . Heart disease Father   . Hypertension Father   . Hypertension Mother   . Hyperlipidemia Mother   . Breast cancer Mother 65  . Diabetes Maternal Grandmother   . Uterine cancer Maternal Grandmother   . Stomach cancer Paternal Grandfather   . Colon cancer Neg Hx    Stacy Rivas does not have children. She has 1 sister living at 72, no cancer diagnoses.  Stacy Rivas mother is living at 61 and had breast cancer around age 68. Stacy Rivas reports her mother had genetic testing that was negative "about 4 years ago." Patient has 1 maternal aunt who is living, no history of cancer, and no cancers in maternal cousins. Patient's maternal grandmother had uterine cancer in her 90s and died at 10. This grandmother had 2 sisters who both had breast cancer in their 73s.  Maternal grandfather died in his 61s, no cancer.  Stacy Rivas father died in his late 66s due to T-cell lymphoma. Patient had 2 paternal uncles, 1 paternal aunt, all deceased, no history of cancer. No cancers in paternal cousins. Paternal grandmother died at 61, and paternal grandfather had cancer, she believes it was stomach cancer. He passed in his 80s.   Stacy Rivas is unaware of previous family history of genetic testing for hereditary cancer risks. Patient's maternal ancestors are of Caucasian descent, and paternal ancestors are of Caucasian descent. There is no reported Ashkenazi Jewish ancestry. There is no known consanguinity.  GENETIC COUNSELING ASSESSMENT: Stacy Rivas is a 63 y.o. female with a personal and family history which is somewhat suggestive of a hereditary cancer syndrome and predisposition to cancer. We, therefore, discussed and recommended the following at today's visit.   DISCUSSION: We discussed that 10-25% of ovarian cancer is hereditary, with most cases associated with BRCA1/BRCA2 mutations.  There are other genes that can be associated with hereditary  ovarian cancer syndromes and hereditary breast cancer syndromes. We discussed that testing is beneficial for several reasons including knowing if PARP inhibitors would be beneficial, knowing how to follow individuals after completing their treatment, and understand if other family members could be at risk for cancer and allow them to undergo genetic testing.   We reviewed the characteristics, features and inheritance patterns of hereditary cancer syndromes. We also discussed genetic testing, including the appropriate family members to test, the process of testing, insurance coverage and turn-around-time for results. We discussed the implications of a negative, positive and/or variant of uncertain significant result. We recommended Stacy Rivas pursue genetic testing for the [employer] gene panel.   Somatic genes analyzed through TumorNext-HR[facility]: ATM, BARD1, BRCA1, BRCA2, BRIP1, CHEK2, MRE11A, NBN, PALB2, RAD51C, RAD51D.  The CancerNext gene panel offered by [employer] includes sequencing and rearrangement analysis for the following 34 genes:   APC, ATM, BARD1, BMPR1A, BRCA1, BRCA2, BRIP1, CDH1, CDK4, CDKN2A, CHEK2, DICER1, EPCAM, GREM1, HOXB13, MLH1, MRE11A, MSH2, MSH6, MUTYH, NBN, NF1, PALB2, PMS2, POLD1, POLE, PTEN, RAD50, RAD51C, RAD51D, SMAD4, SMARCA4, STK11, and TP53.   Based on Stacy. Decarolis's personal and family history of cancer, she meets medical criteria for genetic testing. Despite that she meets criteria, she may still have an out of pocket cost. We discussed that if her out of pocket cost for testing is over $100, the laboratory will call and confirm whether she wants to proceed with testing.  If the out of pocket cost of testing is less than $100 she will be billed by the genetic testing laboratory.   PLAN: After considering the risks, benefits, and limitations, Stacy Rivas provided informed consent to pursue genetic testing and the blood sample was sent to [facility] for analysis of the TumorNextHRD+CancerNext panel. Results should be available within approximately 6-8 weeks' time, at which point they will be disclosed by telephone to Stacy Rivas, as will any additional recommendations warranted by these results. Stacy Rivas will receive a summary of her genetic counseling visit and a copy of her results once available. This information will also be available in Epic.   Stacy Rivas questions were answered to her satisfaction today. Our contact information was provided should additional questions or concerns arise. Thank you for the referral and allowing [place] to share in the care of your patient.   Faith Rogue, Stacy, [facility] Genetic Counselor [email or web address] Phone: [phone]  The patient was seen for a total of 40 minutes in  face-to-face genetic counseling.  Drs. Magrinat, Lindi Adie and/or Burr Medico were available for discussion regarding this case.   _______________________________________________________________________ For Office Staff:  Number of people involved in session: 1 Was an Intern/ student involved with case: no

## 2019-03-11 ENCOUNTER — Other Ambulatory Visit: Payer: Self-pay

## 2019-03-11 ENCOUNTER — Inpatient Hospital Stay: Payer: Managed Care, Other (non HMO)

## 2019-03-11 VITALS — BP 153/85 | HR 78 | Temp 98.2°F | Resp 16

## 2019-03-11 DIAGNOSIS — C562 Malignant neoplasm of left ovary: Secondary | ICD-10-CM

## 2019-03-11 DIAGNOSIS — Z5111 Encounter for antineoplastic chemotherapy: Secondary | ICD-10-CM | POA: Diagnosis not present

## 2019-03-11 MED ORDER — HEPARIN SOD (PORK) LOCK FLUSH 100 UNIT/ML IV SOLN
500.0000 [IU] | Freq: Once | INTRAVENOUS | Status: DC | PRN
  Filled 2019-03-11: qty 5

## 2019-03-11 MED ORDER — FAMOTIDINE IN NACL 20-0.9 MG/50ML-% IV SOLN
20.0000 mg | Freq: Once | INTRAVENOUS | Status: AC
  Administered 2019-03-11: 09:00:00 20 mg via INTRAVENOUS

## 2019-03-11 MED ORDER — SODIUM CHLORIDE 0.9 % IV SOLN
554.4000 mg | Freq: Once | INTRAVENOUS | Status: AC
  Administered 2019-03-11: 13:00:00 550 mg via INTRAVENOUS
  Filled 2019-03-11: qty 55

## 2019-03-11 MED ORDER — DIPHENHYDRAMINE HCL 50 MG/ML IJ SOLN
50.0000 mg | Freq: Once | INTRAMUSCULAR | Status: AC
  Administered 2019-03-11: 09:00:00 50 mg via INTRAVENOUS

## 2019-03-11 MED ORDER — SODIUM CHLORIDE 0.9 % IV SOLN
175.0000 mg/m2 | Freq: Once | INTRAVENOUS | Status: AC
  Administered 2019-03-11: 10:00:00 282 mg via INTRAVENOUS
  Filled 2019-03-11: qty 47

## 2019-03-11 MED ORDER — PALONOSETRON HCL INJECTION 0.25 MG/5ML
0.2500 mg | Freq: Once | INTRAVENOUS | Status: AC
  Administered 2019-03-11: 09:00:00 0.25 mg via INTRAVENOUS

## 2019-03-11 MED ORDER — SODIUM CHLORIDE 0.9% FLUSH
10.0000 mL | INTRAVENOUS | Status: DC | PRN
  Filled 2019-03-11: qty 10

## 2019-03-11 MED ORDER — PALONOSETRON HCL INJECTION 0.25 MG/5ML
INTRAVENOUS | Status: AC
  Filled 2019-03-11: qty 5

## 2019-03-11 MED ORDER — SODIUM CHLORIDE 0.9 % IV SOLN
Freq: Once | INTRAVENOUS | Status: AC
  Administered 2019-03-11: 09:00:00 via INTRAVENOUS
  Filled 2019-03-11: qty 5

## 2019-03-11 MED ORDER — DIPHENHYDRAMINE HCL 50 MG/ML IJ SOLN
INTRAMUSCULAR | Status: AC
  Filled 2019-03-11: qty 1

## 2019-03-11 MED ORDER — SODIUM CHLORIDE 0.9 % IV SOLN
Freq: Once | INTRAVENOUS | Status: AC
  Administered 2019-03-11: 09:00:00 via INTRAVENOUS
  Filled 2019-03-11: qty 250

## 2019-03-11 MED ORDER — FAMOTIDINE IN NACL 20-0.9 MG/50ML-% IV SOLN
INTRAVENOUS | Status: AC
  Filled 2019-03-11: qty 50

## 2019-03-11 NOTE — Patient Instructions (Signed)
[facility] Discharge Instructions for Patients Receiving Chemotherapy  Today you received the following chemotherapy agents :  Paclitaxel,  Carboplatin.  To help prevent nausea and vomiting after your treatment, we encourage you to take your nausea medication as prescribed.   If you develop nausea and vomiting that is not controlled by your nausea medication, call the clinic.   BELOW ARE SYMPTOMS THAT SHOULD BE REPORTED IMMEDIATELY:  *FEVER GREATER THAN 100.5 F  *CHILLS WITH OR WITHOUT FEVER  NAUSEA AND VOMITING THAT IS NOT CONTROLLED WITH YOUR NAUSEA MEDICATION  *UNUSUAL SHORTNESS OF BREATH  *UNUSUAL BRUISING OR BLEEDING  TENDERNESS IN MOUTH AND THROAT WITH OR WITHOUT PRESENCE OF ULCERS  *URINARY PROBLEMS  *BOWEL PROBLEMS  UNUSUAL RASH Items with * indicate a potential emergency and should be followed up as soon as possible.  Feel free to call the clinic should you have any questions or concerns. The clinic phone number is [phone].  Please show the CHEMO ALERT CARD at check-in to the Emergency Department and triage nurse.   

## 2019-04-01 ENCOUNTER — Inpatient Hospital Stay: Payer: Managed Care, Other (non HMO)

## 2019-04-01 ENCOUNTER — Inpatient Hospital Stay: Payer: Managed Care, Other (non HMO) | Attending: Gynecologic Oncology | Admitting: Hematology and Oncology

## 2019-04-01 ENCOUNTER — Encounter: Payer: Self-pay | Admitting: Hematology and Oncology

## 2019-04-01 ENCOUNTER — Other Ambulatory Visit: Payer: Self-pay

## 2019-04-01 DIAGNOSIS — T380X5A Adverse effect of glucocorticoids and synthetic analogues, initial encounter: Secondary | ICD-10-CM | POA: Diagnosis not present

## 2019-04-01 DIAGNOSIS — G620 Drug-induced polyneuropathy: Secondary | ICD-10-CM | POA: Insufficient documentation

## 2019-04-01 DIAGNOSIS — C562 Malignant neoplasm of left ovary: Secondary | ICD-10-CM | POA: Diagnosis not present

## 2019-04-01 DIAGNOSIS — Z79899 Other long term (current) drug therapy: Secondary | ICD-10-CM | POA: Insufficient documentation

## 2019-04-01 DIAGNOSIS — R748 Abnormal levels of other serum enzymes: Secondary | ICD-10-CM | POA: Insufficient documentation

## 2019-04-01 DIAGNOSIS — C786 Secondary malignant neoplasm of retroperitoneum and peritoneum: Secondary | ICD-10-CM | POA: Insufficient documentation

## 2019-04-01 DIAGNOSIS — R739 Hyperglycemia, unspecified: Secondary | ICD-10-CM | POA: Insufficient documentation

## 2019-04-01 DIAGNOSIS — T50905A Adverse effect of unspecified drugs, medicaments and biological substances, initial encounter: Secondary | ICD-10-CM | POA: Insufficient documentation

## 2019-04-01 DIAGNOSIS — Z5111 Encounter for antineoplastic chemotherapy: Secondary | ICD-10-CM | POA: Diagnosis not present

## 2019-04-01 DIAGNOSIS — T451X5A Adverse effect of antineoplastic and immunosuppressive drugs, initial encounter: Secondary | ICD-10-CM

## 2019-04-01 DIAGNOSIS — C569 Malignant neoplasm of unspecified ovary: Secondary | ICD-10-CM

## 2019-04-01 LAB — CBC WITH DIFFERENTIAL (CANCER CENTER ONLY)
Abs Immature Granulocytes: 0.03 10*3/uL (ref 0.00–0.07)
Basophils Absolute: 0 10*3/uL (ref 0.0–0.1)
Basophils Relative: 0 %
Eosinophils Absolute: 0 10*3/uL (ref 0.0–0.5)
Eosinophils Relative: 0 %
HCT: 37.1 % (ref 36.0–46.0)
Hemoglobin: 12.6 g/dL (ref 12.0–15.0)
Immature Granulocytes: 1 %
Lymphocytes Relative: 14 %
Lymphs Abs: 0.7 10*3/uL (ref 0.7–4.0)
MCH: 33 pg (ref 26.0–34.0)
MCHC: 34 g/dL (ref 30.0–36.0)
MCV: 97.1 fL (ref 80.0–100.0)
Monocytes Absolute: 0.1 10*3/uL (ref 0.1–1.0)
Monocytes Relative: 1 %
Neutro Abs: 4.4 10*3/uL (ref 1.7–7.7)
Neutrophils Relative %: 84 %
Platelet Count: 224 10*3/uL (ref 150–400)
RBC: 3.82 MIL/uL — ABNORMAL LOW (ref 3.87–5.11)
RDW: 12.9 % (ref 11.5–15.5)
WBC Count: 5.2 10*3/uL (ref 4.0–10.5)
nRBC: 0 % (ref 0.0–0.2)

## 2019-04-01 LAB — CMP (CANCER CENTER ONLY)
ALT: 60 U/L — ABNORMAL HIGH (ref 0–44)
AST: 40 U/L (ref 15–41)
Albumin: 4.1 g/dL (ref 3.5–5.0)
Alkaline Phosphatase: 197 U/L — ABNORMAL HIGH (ref 38–126)
Anion gap: 13 (ref 5–15)
BUN: 12 mg/dL (ref 8–23)
CO2: 22 mmol/L (ref 22–32)
Calcium: 9.4 mg/dL (ref 8.9–10.3)
Chloride: 107 mmol/L (ref 98–111)
Creatinine: 0.71 mg/dL (ref 0.44–1.00)
GFR, Est AFR Am: >60 mL/min (ref >60)
GFR, Estimated: >60 mL/min (ref >60)
Glucose, Bld: 243 mg/dL — ABNORMAL HIGH (ref 70–99)
Potassium: 3.7 mmol/L (ref 3.5–5.1)
Sodium: 142 mmol/L (ref 135–145)
Total Bilirubin: 0.6 mg/dL (ref 0.3–1.2)
Total Protein: 7 g/dL (ref 6.5–8.1)

## 2019-04-01 MED ORDER — HEPARIN SOD (PORK) LOCK FLUSH 100 UNIT/ML IV SOLN
500.0000 [IU] | Freq: Once | INTRAVENOUS | Status: AC | PRN
  Administered 2019-04-01: 500 [IU]
  Filled 2019-04-01: qty 5

## 2019-04-01 MED ORDER — SODIUM CHLORIDE 0.9% FLUSH
10.0000 mL | INTRAVENOUS | Status: DC | PRN
  Administered 2019-04-01: 10 mL
  Filled 2019-04-01: qty 10

## 2019-04-01 MED ORDER — SODIUM CHLORIDE 0.9 % IV SOLN
Freq: Once | INTRAVENOUS | Status: AC
  Administered 2019-04-01: 10:00:00 via INTRAVENOUS
  Filled 2019-04-01: qty 250

## 2019-04-01 MED ORDER — SODIUM CHLORIDE 0.9 % IV SOLN
469.2000 mg | Freq: Once | INTRAVENOUS | Status: AC
  Administered 2019-04-01: 470 mg via INTRAVENOUS
  Filled 2019-04-01: qty 47

## 2019-04-01 MED ORDER — FAMOTIDINE IN NACL 20-0.9 MG/50ML-% IV SOLN
20.0000 mg | Freq: Once | INTRAVENOUS | Status: AC
  Administered 2019-04-01: 20 mg via INTRAVENOUS

## 2019-04-01 MED ORDER — PALONOSETRON HCL INJECTION 0.25 MG/5ML
0.2500 mg | Freq: Once | INTRAVENOUS | Status: AC
  Administered 2019-04-01: 0.25 mg via INTRAVENOUS

## 2019-04-01 MED ORDER — SODIUM CHLORIDE 0.9 % IV SOLN
Freq: Once | INTRAVENOUS | Status: AC
  Administered 2019-04-01: 10:00:00 via INTRAVENOUS
  Filled 2019-04-01: qty 5

## 2019-04-01 MED ORDER — SODIUM CHLORIDE 0.9% FLUSH
10.0000 mL | Freq: Once | INTRAVENOUS | Status: AC
  Administered 2019-04-01: 10 mL
  Filled 2019-04-01: qty 10

## 2019-04-01 MED ORDER — DIPHENHYDRAMINE HCL 50 MG/ML IJ SOLN
50.0000 mg | Freq: Once | INTRAMUSCULAR | Status: AC
  Administered 2019-04-01: 50 mg via INTRAVENOUS

## 2019-04-01 MED ORDER — DIPHENHYDRAMINE HCL 50 MG/ML IJ SOLN
INTRAMUSCULAR | Status: AC
  Filled 2019-04-01: qty 1

## 2019-04-01 MED ORDER — PALONOSETRON HCL INJECTION 0.25 MG/5ML
INTRAVENOUS | Status: AC
  Filled 2019-04-01: qty 5

## 2019-04-01 MED ORDER — FAMOTIDINE IN NACL 20-0.9 MG/50ML-% IV SOLN
INTRAVENOUS | Status: AC
  Filled 2019-04-01: qty 50

## 2019-04-01 MED ORDER — SODIUM CHLORIDE 0.9 % IV SOLN
140.0000 mg/m2 | Freq: Once | INTRAVENOUS | Status: AC
  Administered 2019-04-01: 228 mg via INTRAVENOUS
  Filled 2019-04-01: qty 38

## 2019-04-01 NOTE — Assessment & Plan Note (Signed)
she has mild peripheral neuropathy, likely related to side effects of treatment. °I plan to reduce the dose of treatment as outlined above.  °I explained to the patient the rationale of this strategy and reassured the patient it would not compromise the efficacy of treatment ° °

## 2019-04-01 NOTE — Assessment & Plan Note (Signed)
She tolerated treatment well except for persistent mild worsening neuropathy She also have right chest wall discomfort which I also felt related to neuropathic pain from prior shingles infection in the nerve distribution I plan to reduce the dose of Taxol a little bit I will see her again in 3 weeks prior to cycle 6 of therapy

## 2019-04-01 NOTE — Assessment & Plan Note (Signed)
She has hyperglycemia due to dexamethasone She is not symptomatic Observe only for now

## 2019-04-01 NOTE — Patient Instructions (Signed)

## 2019-04-01 NOTE — Assessment & Plan Note (Signed)
She has elevated liver enzymes likely due to side effects of treatment She is not symptomatic Observe only for now We will proceed with treatment without delay

## 2019-04-01 NOTE — Progress Notes (Signed)
Ok to treat with blood sugar today per MD Alvy Bimler - NO new orders at this time

## 2019-04-01 NOTE — Progress Notes (Signed)
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 I plan to reduce the dose of treatment as outlined above.  I explained to the patient the rationale of this strategy and reassured the patient it would not compromise the efficacy of treatment   Hyperglycemia, drug-induced She has hyperglycemia due to dexamethasone She is not symptomatic Observe only for now  Elevated liver enzymes She has elevated liver enzymes likely due to side effects of treatment She is not symptomatic Observe only for now We will proceed with treatment without delay   No orders of the defined types were placed in this encounter.   INTERVAL HISTORY: Please see below for problem oriented charting.. She returns for cycle 5 of chemotherapy She is describing sensation of right sided chest wall discomfort She had prior history of shingles earlier this year in the nerve distribution She also complained of neuropathy at the tips of her toes Denies nausea or vomiting No recent changes in bowel habits  SUMMARY OF ONCOLOGIC HISTORY: Oncology History Overview Note  Her 2 negative, MSI stable, serous   Left ovarian epithelial cancer ([place])  11/09/2018 Imaging   1. 14.7 cm poorly defined soft tissue mass in central pelvis suspicious for primary ovarian carcinoma, with uterine leiomyosarcoma considered a less likely differential  diagnosis. 2. Moderate ascites and diffuse peritoneal carcinomatosis. 3. Several small uterine fibroids.   11/13/2018 Tumor Marker   Patient's tumor was tested for the following markers: CA-125 Results of the tumor marker test revealed 1434   11/21/2018 Procedure   Successful ultrasound-guided diagnostic and therapeutic paracentesis yielding 3.1 liters of peritoneal fluid   11/21/2018 Pathology Results   PERITONEAL/ASCITIC FLUID(SPECIMEN 1 OF 1 COLLECTED 11/21/18): ADENOCARCINOMA. Specimen Clinical Information Pelvic mass suspicious for ovarian cancer Source Peritoneal/Ascitic Fluid, (specimen 1 of 1 collected 11/21/18) Gross Specimen: Received is/are 1000 ccs of dark amber fluid. (CM:cm) Prepared: # Smears: 0 # Concentration Technique Slides (i.e. 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   11/29/2018 Initial Diagnosis   Ovarian cancer ([place])   11/29/2018 Cancer Staging   Staging form: Ovary, Fallopian Tube, and Primary Peritoneal Carcinoma, AJCC 8th Edition - Clinical: cT3, cN0, cM0 - Signed by [name] Lark, MD on 11/29/2018   12/03/2018 Procedure   Successful ultrasound-guided therapeutic paracentesis yielding 3.7 liters of peritoneal fluid.     12/06/2018 Procedure   Placement of a subcutaneous port device. Catheter tip at the SVC and right atrium junction   12/14/2018 Procedure   Successful ultrasound-guided paracentesis yielding 2.9 L of peritoneal fluid   12/28/2018 Tumor Marker   Patient's tumor was tested for the following markers: CA-125 Results of the tumor marker test revealed 812.   12/28/2018 -  Chemotherapy   The patient had carboplatin and taxol for chemotherapy treatment.     02/01/2019 Imaging   Ct abdomen and pelvis 8.6 cm left ovarian mass,  corresponding to the patient's known primary neoplasm, improved.   Mild peritoneal nodularity/omental caking, improved.   Small  abdominopelvic ascites, improved.     02/01/2019 Tumor Marker   Patient's tumor was tested for the following markers: CA-125 Results of the tumor marker test revealed 183.   02/12/2019 Surgery   Preoperative Diagnosis: Stage IIIC ovarian cancer, s/p neoadjuvant chemotherapy      Procedure(s) Performed: 1. Exploratory laparotomy with total abdominal hysterectomy, bilateral salpingo-oophorectomy, omentectomy radical tumor debulking for ovarian cancer.   Surgeon: Thereasa Solo, MD.    Operative Findings: upper abdomen free of disease. No visible omental disease. Small volume (200cc) ascites. 8cm friable mass replacing left ovary and adherent to the sigmoid colon mesentery and ureter on the left.  Anterior fibroid. This represented an optimal cytoreduction (R0) with no gross visible disease remaining.    02/12/2019 Pathology Results   A. OVARY AND FALLOPIAN TUBE, LEFT, SALPINGO-OOPHORECTOMY: - Serous carcinoma, high grade, status post neoadjuvant therapy - See oncology table and comment below B. UTERUS CERVIX WITH RIGHT FALLOPIAN TUBE AND OVARY, HYSTERECTOMY: Uterus: - Serosal surface involved by serous carcinoma - Endomyometrium uninvolved by carcinoma - Benign endometrial polyp (4.1 cm) - Leiomyomata (5.5 cm; largest) - Adenomyosis Cervix: - Uninvolved by carcinoma Left ovary: - Serous carcinoma, high grade Left fallopian tube: - Serous carcinoma, high grade C. SOFT TISSUE, LEFT PELVIC SIDEWALL TUMOR, EXCISION: - Metastatic serous carcinoma, high-grade D. SOFT TISSUE, SIGMOID COLON MESENTERY, EXCISION: - Metastatic serous carcinoma, high-grade E. OMENTUM, TUMOR RESECTION: - Metastatic serous carcinoma, high-grade OVARY or FALLOPIAN TUBE or PRIMARY PERITONEUM: Procedure: Total hysterectomy and bilateral salpingo-oophorectomy, omentectomy and peritoneal biopsies Specimen Integrity: Fragmented Tumor Site: Left ovary and fallopian tube Ovarian Surface Involvement: Present Fallopian  Tube Surface Involvement: Present Tumor Size: 6.3 cm (see comment) Histologic Type: Serous carcinoma Histologic Grade: High-grade Implants: Not applicable Other Tissue/ Organ Involvement: Right ovary, right fallopian tube, omentum, mesentery Largest Extrapelvic Peritoneal Focus: Macroscopic Peritoneal/Ascitic Fluid: Malignant Treatment Effect: No definite or minimal response identified (chemotherapy response score 1 [CRS1] Regional Lymph Nodes: No lymph nodes submitted or found Pathologic Stage Classification (pTNM, AJCC 8th Edition): pT3c, pNX Representative Tumor Block: A4 Comment(s): Additional testing (HER-2, MMR and MSI) are pending. The primary tumor site appears to be the left ovary and fallopian tube. The uterus is only involved on the serosal surface. There is tumor on the anterior peritoneal reflection.  Addendum: Tumor is Her2 negative,MSI stable   02/19/2019 Tumor Marker   Patient's tumor was tested for the following markers: CA-125 Results of the tumor marker test revealed 40.3     REVIEW OF SYSTEMS:   Constitutional: Denies fevers, chills or abnormal weight loss Eyes: Denies blurriness of vision Ears, nose, mouth, throat, and face: Denies mucositis or sore throat Respiratory: Denies cough, dyspnea or wheezes Cardiovascular: Denies palpitation, chest discomfort or lower extremity swelling Gastrointestinal:  Denies nausea, heartburn or change in bowel habits Skin: Denies abnormal skin rashes Lymphatics: Denies new lymphadenopathy or easy bruising Behavioral/Psych: Mood is stable, no new changes  All other systems were reviewed with the patient and are negative.  I have reviewed the past medical history, past surgical history, social history and family history with the patient and they are unchanged from previous note.  ALLERGIES:  is allergic to shrimp [shellfish allergy] and sulfonamide derivatives.  MEDICATIONS:  Current Outpatient Medications  Medication Sig  Dispense Refill  . acetaminophen (TYLENOL) 500 MG tablet Take 500 mg by mouth every 6 (six) hours as needed for  mild pain.    [name] Kitchen ALPRAZolam (XANAX) 0.25 MG tablet Take 1 tablet (0.25 mg total) by mouth at bedtime as needed for anxiety. (Patient taking differently: Take 0.125 mg by mouth at bedtime as needed for anxiety. ) 15 tablet 0  . carboxymethylcellulose (REFRESH PLUS) 0.5 % SOLN Place 1 drop into both eyes 2 (two) times daily as needed (dry eyes).    . Cholecalciferol (VITAMIN D3) 25 MCG (1000 UT) CAPS Take 1,000 Units by mouth daily.     [name] Kitchen dexamethasone (DECADRON) 4 MG tablet Take 1 tablet (4 mg total) by mouth daily. (Patient taking differently: Take 12 mg by mouth See admin instructions. Take 3 tablets the night before and 3 tablets in the morning prior to chemotherapy.) 24 tablet 0  . diphenhydrAMINE (BENADRYL) 25 mg capsule Take 25 mg by mouth at bedtime as needed for sleep.    [name] Kitchen esomeprazole (NEXIUM) 20 MG capsule Take 20 mg by mouth daily.     [name] Kitchen ibuprofen (ADVIL) 800 MG tablet Take 1 tablet (800 mg total) by mouth every 8 (eight) hours as needed for moderate pain. For AFTER surgery 30 tablet 0  . lidocaine-prilocaine (EMLA) cream Apply to affected area once 30 g 3  . loratadine (CLARITIN) 10 MG tablet Take 10 mg by mouth daily as needed for allergies.    . Multiple Vitamin (MULTIVITAMIN WITH MINERALS) TABS tablet Take 1 tablet by mouth daily.    . ondansetron (ZOFRAN) 8 MG tablet Take 1 tablet (8 mg total) by mouth every 8 (eight) hours as needed for refractory nausea / vomiting. 30 tablet 1  . polyethylene glycol (MIRALAX / GLYCOLAX) 17 g packet Take 17 g by mouth daily as needed for mild constipation.    . prochlorperazine (COMPAZINE) 10 MG tablet Take 1 tablet (10 mg total) by mouth every 6 (six) hours as needed (Nausea or vomiting). 30 tablet 1  . senna-docusate (SENOKOT-S) 8.6-50 MG tablet Take 2 tablets by mouth at bedtime. For AFTER surgery, do not take if having diarrhea 30 tablet  1  . traMADol (ULTRAM) 50 MG tablet Take 1 tablet (50 mg total) by mouth every 6 (six) hours as needed. (Patient taking differently: Take 50 mg by mouth every 6 (six) hours as needed for moderate pain. ) 30 tablet 0   No current facility-administered medications for this visit.    Facility-Administered Medications Ordered in Other Visits  Medication Dose Route Frequency Provider Last Rate Last Dose  . CARBOplatin (PARAPLATIN) 470 mg in sodium chloride 0.9 % 250 mL chemo infusion  470 mg Intravenous Once Alvy Bimler, Sindee Stucker, MD      . heparin lock flush 100 unit/mL  500 Units Intracatheter Once PRN Alvy Bimler, Javious Hallisey, MD      . PACLitaxel (TAXOL) 228 mg in sodium chloride 0.9 % 250 mL chemo infusion (> '80mg'$ /m2)  140 mg/m2 (Treatment Plan Recorded) Intravenous Once Joss Friedel, MD      . sodium chloride flush (NS) 0.9 % injection 10 mL  10 mL Intracatheter PRN Alvy Bimler, Eddie Payette, MD        PHYSICAL EXAMINATION: ECOG PERFORMANCE STATUS: 1 - Symptomatic but completely ambulatory  Vitals:   04/01/19 0838  BP: (!) 148/86  Pulse: 90  Resp: 18  Temp: 98.2 F (36.8 C)  SpO2: 100%   Filed Weights   04/01/19 0838  Weight: 134 lb 12.8 oz (61.1 kg)    GENERAL:alert, no distress and comfortable SKIN: skin color, texture, turgor are normal, no rashes or significant lesions EYES:  normal, Conjunctiva are pink and non-injected, sclera clear OROPHARYNX:no exudate, no erythema and lips, buccal mucosa, and tongue normal  NECK: supple, thyroid normal size, non-tender, without nodularity LYMPH:  no palpable lymphadenopathy in the cervical, axillary or inguinal LUNGS: clear to auscultation and percussion with normal breathing effort HEART: regular rate & rhythm and no murmurs and no lower extremity edema ABDOMEN:abdomen soft, non-tender and normal bowel sounds Musculoskeletal:no cyanosis of digits and no clubbing  NEURO: alert & oriented x 3 with fluent speech, no focal motor/sensory deficits  LABORATORY DATA:  I have  reviewed the data as listed    Component Value Date/Time   NA 142 04/01/2019 0815   K 3.7 04/01/2019 0815   CL 107 04/01/2019 0815   CO2 22 04/01/2019 0815   GLUCOSE 243 (H) 04/01/2019 0815   BUN 12 04/01/2019 0815   CREATININE 0.71 04/01/2019 0815   CALCIUM 9.4 04/01/2019 0815   PROT 7.0 04/01/2019 0815   ALBUMIN 4.1 04/01/2019 0815   AST 40 04/01/2019 0815   ALT 60 (H) 04/01/2019 0815   ALKPHOS 197 (H) 04/01/2019 0815   BILITOT 0.6 04/01/2019 0815   GFRNONAA >60 04/01/2019 0815   GFRAA >60 04/01/2019 0815    No results found for: SPEP, UPEP  Lab Results  Component Value Date   WBC 5.2 04/01/2019   NEUTROABS 4.4 04/01/2019   HGB 12.6 04/01/2019   HCT 37.1 04/01/2019   MCV 97.1 04/01/2019   PLT 224 04/01/2019      Chemistry      Component Value Date/Time   NA 142 04/01/2019 0815   K 3.7 04/01/2019 0815   CL 107 04/01/2019 0815   CO2 22 04/01/2019 0815   BUN 12 04/01/2019 0815   CREATININE 0.71 04/01/2019 0815      Component Value Date/Time   CALCIUM 9.4 04/01/2019 0815   ALKPHOS 197 (H) 04/01/2019 0815   AST 40 04/01/2019 0815   ALT 60 (H) 04/01/2019 0815   BILITOT 0.6 04/01/2019 0815     All questions were answered. The patient knows to call the clinic with any problems, questions or concerns. No barriers to learning was detected.  I spent 25 minutes counseling the patient face to face. The total time spent in the appointment was 30 minutes and more than 50% was on counseling and review of test results  [name] Lark, MD 04/01/2019 10:26 AM

## 2019-04-01 NOTE — Patient Instructions (Signed)
[facility] Discharge Instructions for Patients Receiving Chemotherapy  Today you received the following chemotherapy agents Taxol, Carboplatin  To help prevent nausea and vomiting after your treatment, we encourage you to take your nausea medication as directed  If you develop nausea and vomiting that is not controlled by your nausea medication, call the clinic.   BELOW ARE SYMPTOMS THAT SHOULD BE REPORTED IMMEDIATELY:  *FEVER GREATER THAN 100.5 F  *CHILLS WITH OR WITHOUT FEVER  NAUSEA AND VOMITING THAT IS NOT CONTROLLED WITH YOUR NAUSEA MEDICATION  *UNUSUAL SHORTNESS OF BREATH  *UNUSUAL BRUISING OR BLEEDING  TENDERNESS IN MOUTH AND THROAT WITH OR WITHOUT PRESENCE OF ULCERS  *URINARY PROBLEMS  *BOWEL PROBLEMS  UNUSUAL RASH Items with * indicate a potential emergency and should be followed up as soon as possible.  Feel free to call the clinic should you have any questions or concerns. The clinic phone number is [phone].  Please show the CHEMO ALERT CARD at check-in to the Emergency Department and triage nurse.   

## 2019-04-02 ENCOUNTER — Telehealth: Payer: Self-pay | Admitting: Oncology

## 2019-04-02 ENCOUNTER — Encounter: Payer: Self-pay | Admitting: Oncology

## 2019-04-02 LAB — CA 125: Cancer Antigen (CA) 125: 8.1 U/mL (ref 0.0–38.1)

## 2019-04-02 NOTE — Telephone Encounter (Signed)
Called Stacy Rivas and advised her of normal CA 125 results per Dr. Alvy Bimler.  She verbalized understanding and asked if she can have a letter clearing her to work from home starting on 04/08/19.

## 2019-04-22 ENCOUNTER — Other Ambulatory Visit: Payer: Self-pay

## 2019-04-22 ENCOUNTER — Inpatient Hospital Stay: Payer: Managed Care, Other (non HMO)

## 2019-04-22 ENCOUNTER — Encounter: Payer: Self-pay | Admitting: Hematology and Oncology

## 2019-04-22 ENCOUNTER — Inpatient Hospital Stay (HOSPITAL_BASED_OUTPATIENT_CLINIC_OR_DEPARTMENT_OTHER): Payer: Managed Care, Other (non HMO) | Admitting: Hematology and Oncology

## 2019-04-22 ENCOUNTER — Other Ambulatory Visit: Payer: Self-pay | Admitting: Medical

## 2019-04-22 VITALS — BP 150/83

## 2019-04-22 VITALS — BP 154/96 | HR 86 | Temp 97.8°F | Resp 18 | Ht 62.0 in | Wt 135.3 lb

## 2019-04-22 DIAGNOSIS — R748 Abnormal levels of other serum enzymes: Secondary | ICD-10-CM

## 2019-04-22 DIAGNOSIS — C562 Malignant neoplasm of left ovary: Secondary | ICD-10-CM

## 2019-04-22 DIAGNOSIS — C569 Malignant neoplasm of unspecified ovary: Secondary | ICD-10-CM

## 2019-04-22 DIAGNOSIS — G44201 Tension-type headache, unspecified, intractable: Secondary | ICD-10-CM

## 2019-04-22 DIAGNOSIS — Z5111 Encounter for antineoplastic chemotherapy: Secondary | ICD-10-CM | POA: Diagnosis not present

## 2019-04-22 DIAGNOSIS — T451X5A Adverse effect of antineoplastic and immunosuppressive drugs, initial encounter: Secondary | ICD-10-CM

## 2019-04-22 DIAGNOSIS — G620 Drug-induced polyneuropathy: Secondary | ICD-10-CM | POA: Diagnosis not present

## 2019-04-22 LAB — CBC WITH DIFFERENTIAL (CANCER CENTER ONLY)
Abs Immature Granulocytes: 0.01 10*3/uL (ref 0.00–0.07)
Basophils Absolute: 0 10*3/uL (ref 0.0–0.1)
Basophils Relative: 0 %
Eosinophils Absolute: 0 10*3/uL (ref 0.0–0.5)
Eosinophils Relative: 0 %
HCT: 37.2 % (ref 36.0–46.0)
Hemoglobin: 12.7 g/dL (ref 12.0–15.0)
Immature Granulocytes: 0 %
Lymphocytes Relative: 8 %
Lymphs Abs: 0.6 10*3/uL — ABNORMAL LOW (ref 0.7–4.0)
MCH: 32.2 pg (ref 26.0–34.0)
MCHC: 34.1 g/dL (ref 30.0–36.0)
MCV: 94.2 fL (ref 80.0–100.0)
Monocytes Absolute: 0.1 10*3/uL (ref 0.1–1.0)
Monocytes Relative: 1 %
Neutro Abs: 6 10*3/uL (ref 1.7–7.7)
Neutrophils Relative %: 91 %
Platelet Count: 201 10*3/uL (ref 150–400)
RBC: 3.95 MIL/uL (ref 3.87–5.11)
RDW: 12.5 % (ref 11.5–15.5)
WBC Count: 6.7 10*3/uL (ref 4.0–10.5)
nRBC: 0 % (ref 0.0–0.2)

## 2019-04-22 LAB — CMP (CANCER CENTER ONLY)
ALT: 57 U/L — ABNORMAL HIGH (ref 0–44)
AST: 38 U/L (ref 15–41)
Albumin: 4.1 g/dL (ref 3.5–5.0)
Alkaline Phosphatase: 151 U/L — ABNORMAL HIGH (ref 38–126)
Anion gap: 12 (ref 5–15)
BUN: 13 mg/dL (ref 8–23)
CO2: 23 mmol/L (ref 22–32)
Calcium: 9.6 mg/dL (ref 8.9–10.3)
Chloride: 107 mmol/L (ref 98–111)
Creatinine: 0.64 mg/dL (ref 0.44–1.00)
GFR, Est AFR Am: >60 mL/min (ref >60)
GFR, Estimated: >60 mL/min (ref >60)
Glucose, Bld: 137 mg/dL — ABNORMAL HIGH (ref 70–99)
Potassium: 3.9 mmol/L (ref 3.5–5.1)
Sodium: 142 mmol/L (ref 135–145)
Total Bilirubin: 0.7 mg/dL (ref 0.3–1.2)
Total Protein: 7 g/dL (ref 6.5–8.1)

## 2019-04-22 MED ORDER — ACETAMINOPHEN 325 MG PO TABS
650.0000 mg | ORAL_TABLET | Freq: Once | ORAL | Status: AC
  Administered 2019-04-22: 650 mg via ORAL

## 2019-04-22 MED ORDER — SODIUM CHLORIDE 0.9% FLUSH
10.0000 mL | INTRAVENOUS | Status: DC | PRN
  Administered 2019-04-22: 10 mL
  Filled 2019-04-22: qty 10

## 2019-04-22 MED ORDER — PALONOSETRON HCL INJECTION 0.25 MG/5ML
0.2500 mg | Freq: Once | INTRAVENOUS | Status: AC
  Administered 2019-04-22: 0.25 mg via INTRAVENOUS

## 2019-04-22 MED ORDER — FAMOTIDINE IN NACL 20-0.9 MG/50ML-% IV SOLN
20.0000 mg | Freq: Once | INTRAVENOUS | Status: AC
  Administered 2019-04-22: 20 mg via INTRAVENOUS

## 2019-04-22 MED ORDER — DIPHENHYDRAMINE HCL 50 MG/ML IJ SOLN
50.0000 mg | Freq: Once | INTRAMUSCULAR | Status: AC
  Administered 2019-04-22: 50 mg via INTRAVENOUS

## 2019-04-22 MED ORDER — SODIUM CHLORIDE 0.9 % IV SOLN
469.2000 mg | Freq: Once | INTRAVENOUS | Status: AC
  Administered 2019-04-22: 470 mg via INTRAVENOUS
  Filled 2019-04-22: qty 47

## 2019-04-22 MED ORDER — ACETAMINOPHEN 325 MG PO TABS
ORAL_TABLET | ORAL | Status: AC
  Filled 2019-04-22: qty 2

## 2019-04-22 MED ORDER — DIPHENHYDRAMINE HCL 50 MG/ML IJ SOLN
INTRAMUSCULAR | Status: AC
  Filled 2019-04-22: qty 1

## 2019-04-22 MED ORDER — SODIUM CHLORIDE 0.9 % IV SOLN
Freq: Once | INTRAVENOUS | Status: AC
  Administered 2019-04-22: 12:00:00 via INTRAVENOUS
  Filled 2019-04-22: qty 5

## 2019-04-22 MED ORDER — PALONOSETRON HCL INJECTION 0.25 MG/5ML
INTRAVENOUS | Status: AC
  Filled 2019-04-22: qty 5

## 2019-04-22 MED ORDER — HEPARIN SOD (PORK) LOCK FLUSH 100 UNIT/ML IV SOLN
500.0000 [IU] | Freq: Once | INTRAVENOUS | Status: AC | PRN
  Administered 2019-04-22: 500 [IU]
  Filled 2019-04-22: qty 5

## 2019-04-22 MED ORDER — SODIUM CHLORIDE 0.9 % IV SOLN
Freq: Once | INTRAVENOUS | Status: AC
  Administered 2019-04-22: 11:00:00 via INTRAVENOUS
  Filled 2019-04-22: qty 250

## 2019-04-22 MED ORDER — FAMOTIDINE IN NACL 20-0.9 MG/50ML-% IV SOLN
INTRAVENOUS | Status: AC
  Filled 2019-04-22: qty 50

## 2019-04-22 MED ORDER — SODIUM CHLORIDE 0.9 % IV SOLN
140.0000 mg/m2 | Freq: Once | INTRAVENOUS | Status: AC
  Administered 2019-04-22: 228 mg via INTRAVENOUS
  Filled 2019-04-22: qty 38

## 2019-04-22 MED ORDER — SODIUM CHLORIDE 0.9% FLUSH
10.0000 mL | Freq: Once | INTRAVENOUS | Status: AC
  Administered 2019-04-22: 10 mL
  Filled 2019-04-22: qty 10

## 2019-04-22 NOTE — Patient Instructions (Signed)
[facility] Discharge Instructions for Patients Receiving Chemotherapy  Today you received the following chemotherapy agents: Taxol, Carboplatin  To help prevent nausea and vomiting after your treatment, we encourage you to take your nausea medication as directed by your MD.   If you develop nausea and vomiting that is not controlled by your nausea medication, call the clinic.   BELOW ARE SYMPTOMS THAT SHOULD BE REPORTED IMMEDIATELY:  *FEVER GREATER THAN 100.5 F  *CHILLS WITH OR WITHOUT FEVER  NAUSEA AND VOMITING THAT IS NOT CONTROLLED WITH YOUR NAUSEA MEDICATION  *UNUSUAL SHORTNESS OF BREATH  *UNUSUAL BRUISING OR BLEEDING  TENDERNESS IN MOUTH AND THROAT WITH OR WITHOUT PRESENCE OF ULCERS  *URINARY PROBLEMS  *BOWEL PROBLEMS  UNUSUAL RASH Items with * indicate a potential emergency and should be followed up as soon as possible.  Feel free to call the clinic should you have any questions or concerns. The clinic phone number is [phone].  Please show the CHEMO ALERT CARD at check-in to the Emergency Department and triage nurse. Coronavirus (COVID-19) Are you at risk?  Are you at risk for the Coronavirus (COVID-19)?  To be considered HIGH RISK for Coronavirus (COVID-19), you have to meet the following criteria:  . Traveled to [place], [place], [place], [place] or [place]; or in the [place] to [place], [place], [place], or [place]; and have fever, cough, and shortness of breath within the last 2 weeks of travel OR . Been in close contact with a person diagnosed with COVID-19 within the last 2 weeks and have fever, cough, and shortness of breath . IF YOU DO NOT MEET THESE CRITERIA, YOU ARE CONSIDERED LOW RISK FOR COVID-19.  What to do if you are HIGH RISK for COVID-19?  . If you are having a medical emergency, call 911. . Seek medical care right away. Before you go to a doctor's office, urgent care or emergency department, call ahead and tell  them about your recent travel, contact with someone diagnosed with COVID-19, and your symptoms. You should receive instructions from your physician's office regarding next steps of care.  . When you arrive at healthcare provider, tell the healthcare staff immediately you have returned from visiting [place], [place], [place], [place] or [place]; or traveled in the [place] to [place], [place], [place], or [place]; in the last two weeks or you have been in close contact with a person diagnosed with COVID-19 in the last 2 weeks.   . Tell the health care staff about your symptoms: fever, cough and shortness of breath. . After you have been seen by a medical provider, you will be either: o Tested for (COVID-19) and discharged home on quarantine except to seek medical care if symptoms worsen, and asked to  - Stay home and avoid contact with others until you get your results (4-5 days)  - Avoid travel on public transportation if possible (such as bus, train, or airplane) or o Sent to the Emergency Department by EMS for evaluation, COVID-19 testing, and possible admission depending on your condition and test results.  What to do if you are LOW RISK for COVID-19?  Reduce your risk of any infection by using the same precautions used for avoiding the common cold or flu:  . Wash your hands often with soap and warm water for at least 20 seconds.  If soap and water are not readily available, use an alcohol-based hand sanitizer with at least 60% alcohol.  . If   coughing or sneezing, cover your mouth and nose by coughing or sneezing into the elbow areas of your shirt or coat, into a tissue or into your sleeve (not your hands). . Avoid shaking hands with others and consider head nods or verbal greetings only. . Avoid touching your eyes, nose, or mouth with unwashed hands.  . Avoid close contact with people who are sick. . Avoid places or events with large numbers of people in one location, like concerts or  sporting events. . Carefully consider travel plans you have or are making. . If you are planning any travel outside or inside the [place], visit the CDC's Travelers' Health webpage for the latest health notices. . If you have some symptoms but not all symptoms, continue to monitor at home and seek medical attention if your symptoms worsen. . If you are having a medical emergency, call 911.   ADDITIONAL HEALTHCARE OPTIONS FOR PATIENTS  [facility] / e-Visit: [email or web address]         [facility]: [phone]  [facility]: [phone]                   [facility]: [phone]    

## 2019-04-22 NOTE — Assessment & Plan Note (Signed)
She has elevated liver enzymes likely due to side effects of treatment She is not symptomatic Observe only for now We will proceed with treatment without delay

## 2019-04-22 NOTE — Assessment & Plan Note (Signed)
she has mild peripheral neuropathy, likely related to side effects of treatment. It is stable for now We will observe closely

## 2019-04-22 NOTE — Progress Notes (Signed)
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[place]) Overall, she tolerated treatment well except for some mild intermittent right upper quadrant discomfort which I do not believe is related She will complete cycle 6 of treatment today I plan to repeat imaging study and blood work in a month and see her back afterwards  Elevated liver enzymes She has elevated liver enzymes likely due to side effects of treatment She is not symptomatic Observe only for now We will proceed with treatment without delay  Peripheral neuropathy due to chemotherapy [facility]) she has mild peripheral neuropathy, likely related to side effects of treatment. It is stable for now We will observe closely   Orders Placed This Encounter  Procedures  . CT Abdomen Pelvis W Contrast    Standing Status:   Future    Standing Expiration Date:   04/21/2020    Order Specific Question:   If indicated for the ordered procedure, I authorize the administration of contrast media per Radiology protocol    Answer:   Yes    Order Specific Question:   Preferred imaging location?    Answer:   [facility]    Order Specific Question:   Radiology Contrast Protocol - do NOT remove file path    Answer:   [email or web address]    INTERVAL HISTORY: Please see below for problem oriented charting. 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 14.7 cm poorly defined soft tissue mass in central  pelvis suspicious for primary ovarian carcinoma, with uterine leiomyosarcoma considered a less likely differential diagnosis. 2. Moderate ascites and diffuse peritoneal carcinomatosis. 3. Several small uterine fibroids.   11/13/2018 Tumor Marker   Patient's tumor was tested for the following markers: CA-125 Results of the tumor marker test revealed 1434   11/21/2018 Procedure   Successful ultrasound-guided diagnostic and therapeutic paracentesis yielding 3.1 liters of peritoneal fluid   11/21/2018 Pathology Results   PERITONEAL/ASCITIC FLUID(SPECIMEN 1 OF 1 COLLECTED 11/21/18): ADENOCARCINOMA. Specimen Clinical Information Pelvic mass suspicious for ovarian cancer Source Peritoneal/Ascitic Fluid, (specimen 1 of 1 collected 11/21/18) Gross Specimen: Received is/are 1000 ccs of dark amber fluid. (CM:cm) Prepared: # Smears: 0 # Concentration Technique Slides (i.e. 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   11/29/2018 Initial Diagnosis   Ovarian cancer ([place])   11/29/2018 Cancer Staging   Staging form: Ovary, Fallopian Tube, and Primary Peritoneal Carcinoma, AJCC 8th Edition - Clinical: cT3, cN0, cM0 - Signed by [name] Lark, MD on 11/29/2018   12/03/2018 Procedure   Successful ultrasound-guided therapeutic paracentesis yielding 3.7 liters of peritoneal fluid.     12/06/2018 Procedure   Placement of a subcutaneous port device. Catheter tip at the SVC and right atrium junction   12/14/2018 Procedure   Successful ultrasound-guided paracentesis yielding 2.9 L of peritoneal fluid   12/28/2018 Tumor Marker   Patient's tumor was tested for the following markers: CA-125 Results of the tumor marker test revealed 812.  12/28/2018 -  Chemotherapy   The patient had carboplatin and taxol for chemotherapy treatment.     02/01/2019 Imaging   Ct abdomen and pelvis 8.6 cm left ovarian mass, corresponding to the  patient's known primary neoplasm, improved.   Mild peritoneal nodularity/omental caking, improved.   Small abdominopelvic ascites, improved.     02/01/2019 Tumor Marker   Patient's tumor was tested for the following markers: CA-125 Results of the tumor marker test revealed 183.   02/12/2019 Surgery   Preoperative Diagnosis: Stage IIIC ovarian cancer, s/p neoadjuvant chemotherapy      Procedure(s) Performed: 1. Exploratory laparotomy with total abdominal hysterectomy, bilateral salpingo-oophorectomy, omentectomy radical tumor debulking for ovarian cancer.   Surgeon: Thereasa Solo, MD.    Operative Findings: upper abdomen free of disease. No visible omental disease. Small volume (200cc) ascites. 8cm friable mass replacing left ovary and adherent to the sigmoid colon mesentery and ureter on the left.  Anterior fibroid. This represented an optimal cytoreduction (R0) with no gross visible disease remaining.    02/12/2019 Pathology Results   A. OVARY AND FALLOPIAN TUBE, LEFT, SALPINGO-OOPHORECTOMY: - Serous carcinoma, high grade, status post neoadjuvant therapy - See oncology table and comment below B. UTERUS CERVIX WITH RIGHT FALLOPIAN TUBE AND OVARY, HYSTERECTOMY: Uterus: - Serosal surface involved by serous carcinoma - Endomyometrium uninvolved by carcinoma - Benign endometrial polyp (4.1 cm) - Leiomyomata (5.5 cm; largest) - Adenomyosis Cervix: - Uninvolved by carcinoma Left ovary: - Serous carcinoma, high grade Left fallopian tube: - Serous carcinoma, high grade C. SOFT TISSUE, LEFT PELVIC SIDEWALL TUMOR, EXCISION: - Metastatic serous carcinoma, high-grade D. SOFT TISSUE, SIGMOID COLON MESENTERY, EXCISION: - Metastatic serous carcinoma, high-grade E. OMENTUM, TUMOR RESECTION: - Metastatic serous carcinoma, high-grade OVARY or FALLOPIAN TUBE or PRIMARY PERITONEUM: Procedure: Total hysterectomy and bilateral salpingo-oophorectomy, omentectomy and peritoneal biopsies Specimen  Integrity: Fragmented Tumor Site: Left ovary and fallopian tube Ovarian Surface Involvement: Present Fallopian Tube Surface Involvement: Present Tumor Size: 6.3 cm (see comment) Histologic Type: Serous carcinoma Histologic Grade: High-grade Implants: Not applicable Other Tissue/ Organ Involvement: Right ovary, right fallopian tube, omentum, mesentery Largest Extrapelvic Peritoneal Focus: Macroscopic Peritoneal/Ascitic Fluid: Malignant Treatment Effect: No definite or minimal response identified (chemotherapy response score 1 [CRS1] Regional Lymph Nodes: No lymph nodes submitted or found Pathologic Stage Classification (pTNM, AJCC 8th Edition): pT3c, pNX Representative Tumor Block: A4 Comment(s): Additional testing (HER-2, MMR and MSI) are pending. The primary tumor site appears to be the left ovary and fallopian tube. The uterus is only involved on the serosal surface. There is tumor on the anterior peritoneal reflection.  Addendum: Tumor is Her2 negative,MSI stable   02/19/2019 Tumor Marker   Patient's tumor was tested for the following markers: CA-125 Results of the tumor marker test revealed 40.3   04/01/2019 Tumor Marker   Patient's tumor was tested for the following markers: CA-125 Results of the tumor marker test revealed 8.1     REVIEW OF SYSTEMS:   Constitutional: Denies fevers, chills or abnormal weight loss Eyes: Denies blurriness of vision Ears, nose, mouth, throat, and face: Denies mucositis or sore throat Respiratory: Denies cough, dyspnea or wheezes Cardiovascular: Denies palpitation, chest discomfort or lower extremity swelling Gastrointestinal:  Denies nausea, heartburn or change in bowel habits Skin: Denies abnormal skin rashes Lymphatics: Denies new lymphadenopathy or easy bruising Neurological:Denies numbness, tingling or new weaknesses Behavioral/Psych: Mood is stable, no new changes  All other systems were reviewed with the patient and are negative.  I have  reviewed the past  medical history, past surgical history, social history and family history with the patient and they are unchanged from previous note.  ALLERGIES:  is allergic to shrimp [shellfish allergy] and sulfonamide derivatives.  MEDICATIONS:  Current Outpatient Medications  Medication Sig Dispense Refill  . acetaminophen (TYLENOL) 500 MG tablet Take 500 mg by mouth every 6 (six) hours as needed for mild pain.    [name] Kitchen ALPRAZolam (XANAX) 0.25 MG tablet Take 1 tablet (0.25 mg total) by mouth at bedtime as needed for anxiety. (Patient taking differently: Take 0.125 mg by mouth at bedtime as needed for anxiety. ) 15 tablet 0  . carboxymethylcellulose (REFRESH PLUS) 0.5 % SOLN Place 1 drop into both eyes 2 (two) times daily as needed (dry eyes).    . Cholecalciferol (VITAMIN D3) 25 MCG (1000 UT) CAPS Take 1,000 Units by mouth daily.     [name] Kitchen dexamethasone (DECADRON) 4 MG tablet Take 1 tablet (4 mg total) by mouth daily. (Patient taking differently: Take 12 mg by mouth See admin instructions. Take 3 tablets the night before and 3 tablets in the morning prior to chemotherapy.) 24 tablet 0  . diphenhydrAMINE (BENADRYL) 25 mg capsule Take 25 mg by mouth at bedtime as needed for sleep.    [name] Kitchen esomeprazole (NEXIUM) 20 MG capsule Take 20 mg by mouth daily.     [name] Kitchen ibuprofen (ADVIL) 800 MG tablet Take 1 tablet (800 mg total) by mouth every 8 (eight) hours as needed for moderate pain. For AFTER surgery 30 tablet 0  . lidocaine-prilocaine (EMLA) cream Apply to affected area once 30 g 3  . loratadine (CLARITIN) 10 MG tablet Take 10 mg by mouth daily as needed for allergies.    . Multiple Vitamin (MULTIVITAMIN WITH MINERALS) TABS tablet Take 1 tablet by mouth daily.    . ondansetron (ZOFRAN) 8 MG tablet Take 1 tablet (8 mg total) by mouth every 8 (eight) hours as needed for refractory nausea / vomiting. 30 tablet 1  . polyethylene glycol (MIRALAX / GLYCOLAX) 17 g packet Take 17 g by mouth daily as needed for mild  constipation.    . prochlorperazine (COMPAZINE) 10 MG tablet Take 1 tablet (10 mg total) by mouth every 6 (six) hours as needed (Nausea or vomiting). 30 tablet 1  . senna-docusate (SENOKOT-S) 8.6-50 MG tablet Take 2 tablets by mouth at bedtime. For AFTER surgery, do not take if having diarrhea 30 tablet 1  . traMADol (ULTRAM) 50 MG tablet Take 1 tablet (50 mg total) by mouth every 6 (six) hours as needed. (Patient taking differently: Take 50 mg by mouth every 6 (six) hours as needed for moderate pain. ) 30 tablet 0   No current facility-administered medications for this visit.    Facility-Administered Medications Ordered in Other Visits  Medication Dose Route Frequency Provider Last Rate Last Dose  . CARBOplatin (PARAPLATIN) 470 mg in sodium chloride 0.9 % 250 mL chemo infusion  470 mg Intravenous Once Alvy Bimler, Hulon Ferron, MD      . fosaprepitant (EMEND) 150 mg, dexamethasone (DECADRON) 12 mg in sodium chloride 0.9 % 145 mL IVPB   Intravenous Once [name] Lark, MD 454 mL/hr at 04/22/19 1141    . heparin lock flush 100 unit/mL  500 Units Intracatheter Once PRN Alvy Bimler, Craige Patel, MD      . PACLitaxel (TAXOL) 228 mg in sodium chloride 0.9 % 250 mL chemo infusion (> '80mg'$ /m2)  140 mg/m2 (Treatment Plan Recorded) Intravenous Once Alvy Bimler, Ayeden Gladman, MD      . sodium chloride flush (  NS) 0.9 % injection 10 mL  10 mL Intracatheter PRN Alvy Bimler, Gillermo Poch, MD        PHYSICAL EXAMINATION: ECOG PERFORMANCE STATUS: 1 - Symptomatic but completely ambulatory  Vitals:   04/22/19 0934  BP: (!) 154/96  Pulse: 86  Resp: 18  Temp: 97.8 F (36.6 C)  SpO2: 100%   Filed Weights   04/22/19 0934  Weight: 135 lb 4.8 oz (61.4 kg)    GENERAL:alert, no distress and comfortable SKIN: skin color, texture, turgor are normal, no rashes or significant lesions EYES: normal, Conjunctiva are pink and non-injected, sclera clear OROPHARYNX:no exudate, no erythema and lips, buccal mucosa, and tongue normal  NECK: supple, thyroid normal size,  non-tender, without nodularity LYMPH:  no palpable lymphadenopathy in the cervical, axillary or inguinal LUNGS: clear to auscultation and percussion with normal breathing effort HEART: regular rate & rhythm and no murmurs and no lower extremity edema ABDOMEN:abdomen soft, non-tender and normal bowel sounds Musculoskeletal:no cyanosis of digits and no clubbing  NEURO: alert & oriented x 3 with fluent speech, no focal motor/sensory deficits  LABORATORY DATA:  I have reviewed the data as listed    Component Value Date/Time   NA 142 04/22/2019 0915   K 3.9 04/22/2019 0915   CL 107 04/22/2019 0915   CO2 23 04/22/2019 0915   GLUCOSE 137 (H) 04/22/2019 0915   BUN 13 04/22/2019 0915   CREATININE 0.64 04/22/2019 0915   CALCIUM 9.6 04/22/2019 0915   PROT 7.0 04/22/2019 0915   ALBUMIN 4.1 04/22/2019 0915   AST 38 04/22/2019 0915   ALT 57 (H) 04/22/2019 0915   ALKPHOS 151 (H) 04/22/2019 0915   BILITOT 0.7 04/22/2019 0915   GFRNONAA >60 04/22/2019 0915   GFRAA >60 04/22/2019 0915    No results found for: SPEP, UPEP  Lab Results  Component Value Date   WBC 6.7 04/22/2019   NEUTROABS 6.0 04/22/2019   HGB 12.7 04/22/2019   HCT 37.2 04/22/2019   MCV 94.2 04/22/2019   PLT 201 04/22/2019      Chemistry      Component Value Date/Time   NA 142 04/22/2019 0915   K 3.9 04/22/2019 0915   CL 107 04/22/2019 0915   CO2 23 04/22/2019 0915   BUN 13 04/22/2019 0915   CREATININE 0.64 04/22/2019 0915      Component Value Date/Time   CALCIUM 9.6 04/22/2019 0915   ALKPHOS 151 (H) 04/22/2019 0915   AST 38 04/22/2019 0915   ALT 57 (H) 04/22/2019 0915   BILITOT 0.7 04/22/2019 0915       All questions were answered. The patient knows to call the clinic with any problems, questions or concerns. No barriers to learning was detected.  I spent 15 minutes counseling the patient face to face. The total time spent in the appointment was 20 minutes and more than 50% was on counseling and review of  test results  [name] Lark, MD 04/22/2019 11:57 AM

## 2019-04-22 NOTE — Assessment & Plan Note (Signed)
Overall, she tolerated treatment well except for some mild intermittent right upper quadrant discomfort which I do not believe is related She will complete cycle 6 of treatment today I plan to repeat imaging study and blood work in a month and see her back afterwards

## 2019-04-23 ENCOUNTER — Telehealth: Payer: Self-pay | Admitting: Licensed Clinical Social Worker

## 2019-04-24 ENCOUNTER — Telehealth: Payer: Self-pay | Admitting: Hematology and Oncology

## 2019-04-24 NOTE — Telephone Encounter (Signed)
Left message re December appointments. Schedule mailed.  °

## 2019-04-25 ENCOUNTER — Telehealth: Payer: Self-pay | Admitting: Hematology and Oncology

## 2019-04-25 ENCOUNTER — Encounter: Payer: Self-pay | Admitting: Licensed Clinical Social Worker

## 2019-04-25 ENCOUNTER — Ambulatory Visit: Payer: Self-pay | Admitting: Licensed Clinical Social Worker

## 2019-04-25 DIAGNOSIS — Z1379 Encounter for other screening for genetic and chromosomal anomalies: Secondary | ICD-10-CM | POA: Insufficient documentation

## 2019-04-25 NOTE — Progress Notes (Signed)
HPI:  Ms. Altizer was previously seen in the [place] clinic due to a personal and family history of cancer and concerns regarding a hereditary predisposition to cancer. Please refer to our prior cancer genetics clinic note for more information regarding our discussion, assessment and recommendations, at the time. Ms. Isley recent genetic test results were disclosed to her, as were recommendations warranted by these results. These results and recommendations are discussed in more detail below.  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 14.7 cm poorly defined soft tissue mass in central pelvis suspicious for primary ovarian carcinoma, with uterine leiomyosarcoma considered a less likely differential diagnosis. 2. Moderate ascites and diffuse peritoneal carcinomatosis. 3. Several small uterine fibroids.   11/13/2018 Tumor Marker   Patient's tumor was tested for the following markers: CA-125 Results of the tumor marker test revealed 1434   11/21/2018 Procedure   Successful ultrasound-guided diagnostic and therapeutic paracentesis yielding 3.1 liters of peritoneal fluid   11/21/2018 Pathology Results   PERITONEAL/ASCITIC FLUID(SPECIMEN 1 OF 1 COLLECTED 11/21/18): ADENOCARCINOMA. Specimen Clinical Information Pelvic mass suspicious for ovarian cancer Source Peritoneal/Ascitic Fluid, (specimen 1 of 1 collected 11/21/18) Gross Specimen: Received is/are 1000 ccs of dark amber fluid. (CM:cm) Prepared: # Smears: 0 # Concentration Technique Slides (i.e. 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   11/29/2018 Initial Diagnosis   Ovarian cancer ([place])   11/29/2018 Cancer Staging   Staging form: Ovary, Fallopian Tube, and Primary Peritoneal Carcinoma, AJCC 8th Edition - Clinical: cT3,  cN0, cM0 - Signed by [name] Lark, MD on 11/29/2018   12/03/2018 Procedure   Successful ultrasound-guided therapeutic paracentesis yielding 3.7 liters of peritoneal fluid.     12/06/2018 Procedure   Placement of a subcutaneous port device. Catheter tip at the SVC and right atrium junction   12/14/2018 Procedure   Successful ultrasound-guided paracentesis yielding 2.9 L of peritoneal fluid   12/28/2018 Tumor Marker   Patient's tumor was tested for the following markers: CA-125 Results of the tumor marker test revealed 812.   12/28/2018 -  Chemotherapy   The patient had carboplatin and taxol for chemotherapy treatment.     02/01/2019 Imaging   Ct abdomen and pelvis 8.6 cm left ovarian mass, corresponding to the patient's known primary neoplasm, improved.   Mild peritoneal nodularity/omental caking, improved.   Small abdominopelvic ascites, improved.     02/01/2019 Tumor Marker   Patient's tumor was tested for the following markers: CA-125 Results of the tumor marker test revealed 183.   02/12/2019 Surgery   Preoperative Diagnosis: Stage IIIC ovarian cancer, s/p neoadjuvant chemotherapy      Procedure(s) Performed: 1. Exploratory laparotomy with total abdominal hysterectomy, bilateral salpingo-oophorectomy, omentectomy radical tumor debulking for ovarian cancer.   Surgeon: Thereasa Solo, MD.    Operative Findings: upper abdomen free of disease. No visible omental disease. Small volume (200cc) ascites. 8cm friable mass replacing left ovary and adherent to the sigmoid colon mesentery and ureter on the left.  Anterior fibroid. This represented an optimal cytoreduction (R0) with no gross visible disease remaining.    02/12/2019 Pathology Results   A. OVARY AND FALLOPIAN TUBE, LEFT, SALPINGO-OOPHORECTOMY: - Serous carcinoma, high grade, status post neoadjuvant therapy - See oncology table and comment below B. UTERUS CERVIX WITH RIGHT FALLOPIAN TUBE AND OVARY, HYSTERECTOMY: Uterus: - Serosal  surface involved by serous carcinoma - Endomyometrium uninvolved by carcinoma - Benign endometrial polyp (4.1 cm) - Leiomyomata (5.5 cm; largest) - Adenomyosis Cervix: - Uninvolved by carcinoma Left ovary: - Serous carcinoma, high grade Left fallopian tube: - Serous carcinoma, high grade C. SOFT TISSUE, LEFT PELVIC SIDEWALL TUMOR, EXCISION: - Metastatic serous carcinoma, high-grade D. SOFT TISSUE, SIGMOID COLON MESENTERY, EXCISION: - Metastatic serous carcinoma, high-grade E. OMENTUM, TUMOR RESECTION: - Metastatic serous carcinoma, high-grade OVARY or FALLOPIAN TUBE or PRIMARY PERITONEUM: Procedure: Total hysterectomy and bilateral salpingo-oophorectomy, omentectomy and peritoneal biopsies Specimen Integrity: Fragmented Tumor Site: Left ovary and fallopian tube Ovarian Surface Involvement: Present Fallopian Tube Surface Involvement: Present Tumor Size: 6.3 cm (see comment) Histologic Type: Serous carcinoma Histologic Grade: High-grade Implants: Not applicable Other Tissue/ Organ Involvement: Right ovary, right fallopian tube, omentum, mesentery Largest Extrapelvic Peritoneal Focus: Macroscopic Peritoneal/Ascitic Fluid: Malignant Treatment Effect: No definite or minimal response identified (chemotherapy response score 1 [CRS1] Regional Lymph Nodes: No lymph nodes submitted or found Pathologic Stage Classification (pTNM, AJCC 8th Edition): pT3c, pNX Representative Tumor Block: A4 Comment(s): Additional testing (HER-2, MMR and MSI) are pending. The primary tumor site appears to be the left ovary and fallopian tube. The uterus is only involved on the serosal surface. There is tumor on the anterior peritoneal reflection.  Addendum: Tumor is Her2 negative,MSI stable   02/19/2019 Tumor Marker   Patient's tumor was tested for the following markers: CA-125 Results of the tumor marker test revealed 40.3   04/01/2019 Tumor Marker   Patient's tumor was tested for the following markers:  CA-125 Results of the tumor marker test revealed 8.1    Genetic Testing   Negative testing. No pathogenic variants identified on the [employer]. The report date is 04/17/2019.  Somatic genes analyzed through TumorNext-HRD: ATM, BARD1, BRCA1, BRCA2, BRIP1, CHEK2, MRE11A, NBN, PALB2, RAD51C, RAD51D.  The CancerNext gene panel offered by [employer] includes sequencing and rearrangement analysis for the following 36 genes: APC*, ATM*, AXIN2, BARD1, BMPR1A, BRCA1*, BRCA2*, BRIP1*, CDH1*, CDK4, CDKN2A, CHEK2*, DICER1, MLH1*, MSH2*, MSH3, MSH6*, MUTYH*, NBN, NF1*, NTHL1, PALB2*, PMS2*, PTEN*, RAD51C*, RAD51D*, RECQL, SMAD4, SMARCA4, STK11 and TP53* (sequencing and deletion/duplication); HOXB13, POLD1 and POLE (sequencing only); EPCAM and GREM1 (deletion/duplication only).      FAMILY HISTORY:  We obtained a detailed, 4-generation family history.  Significant diagnoses are listed below: Family History  Problem Relation Age of Onset   Lymphoma Father        dx 11s   Heart disease Father    Hypertension Father    Hypertension Mother    Hyperlipidemia Mother    Breast cancer Mother 35   Diabetes Maternal Grandmother    Uterine cancer Maternal Grandmother        dx 34s   Stomach cancer Paternal Grandfather    Breast cancer Other        grandmother's sisters, both dx 76s   Colon cancer Neg Hx    Ms. Louvier does not have children. She has 1 sister living at 75, no cancer diagnoses.  Ms. Ferraiolo mother is living at 32 and had breast cancer around age 63. Ms Messman reports her mother had genetic testing that was negative "about 4 years ago." Patient has 1 maternal aunt who is living, no history of cancer, and no cancers in maternal cousins. Patient's maternal grandmother had uterine cancer in her 23s and died at 7. This grandmother had 2 sisters who both had breast cancer in their 71s. Maternal grandfather died in  his 67s, no cancer.  Ms. Heiman  father died in his late 9s due to T-cell lymphoma. Patient had 2 paternal uncles, 1 paternal aunt, all deceased, no history of cancer. No cancers in paternal cousins. Paternal grandmother died at 19, and paternal grandfather had cancer, she believes it was stomach cancer. He passed in his 64s.   Ms. Goings is unaware of previous family history of genetic testing for hereditary cancer risks. Patient's maternal ancestors are of Caucasian descent, and paternal ancestors are of Caucasian descent. There is no reported Ashkenazi Jewish ancestry. There is no known consanguinity.  GENETIC TEST RESULTS: Genetic testing reported out on 04/17/2019 through the [facility] TumorNext-HRD+CancerNext cancer panel found no pathogenic mutations.   The CancerNextgene panel offered by [facility] includes sequencing and rearrangement analysis for the following 36 genes: APC*, ATM*, AXIN2, BARD1, BMPR1A, BRCA1*, BRCA2*, BRIP1*, CDH1*, CDK4, CDKN2A, CHEK2*, DICER1, MLH1*, MSH2*, MSH3, MSH6*, MUTYH*, NBN, NF1*, NTHL1, PALB2*, PMS2*, PTEN*, RAD51C*, RAD51D*, RECQL, SMAD4, SMARCA4, STK11 and TP53* (sequencing and deletion/duplication); HOXB13, POLD1 and POLE (sequencing only); EPCAM and GREM1 (deletion/duplication only).  Somatic genes analyzed through TumorNext-HRD: ATM, BARD1, BRCA1, BRCA2, BRIP1, CHEK2, MRE11A, NBN, PALB2, RAD51C, RAD51D.  The test report has been scanned into EPIC and is located under the Molecular Pathology section of the Results Review tab.  A portion of the result report is included below for reference.     We discussed with Ms. Eischeid that because current genetic testing is not perfect, it is possible there may be a gene mutation in one of these genes that current testing cannot detect, but that chance is small.  We also discussed, that there could be another gene that has not yet been discovered, or that we have not yet tested, that is responsible for the cancer diagnoses in the family. It is also  possible there is a hereditary cause for the cancer in the family that Ms. Behe did not inherit and therefore was not identified in her testing.  Therefore, it is important to remain in touch with cancer genetics in the future so that we can continue to offer Ms. Ryce the most up to date genetic testing.   ADDITIONAL GENETIC TESTING: We discussed with Ms. Gatti that her genetic testing was fairly extensive.  If there are genes identified to increase cancer risk that can be analyzed in the future, we would be happy to discuss and coordinate this testing at that time.    CANCER SCREENING RECOMMENDATIONS: Ms. Saylor test result is considered negative (normal).  This means that we have not identified a hereditary cause for her  personal and family history of cancer at this time. Most cancers happen by chance and this negative test suggests that her cancer may fall into this category.    While reassuring, this does not definitively rule out a hereditary predisposition to cancer. It is still possible that there could be genetic mutations that are undetectable by current technology. There could be genetic mutations in genes that have not been tested or identified to increase cancer risk.  Therefore, it is recommended she continue to follow the cancer management and screening guidelines provided by her oncology and primary healthcare provider.   An individual's cancer risk and medical management are not determined by genetic test results alone. Overall cancer risk assessment incorporates additional factors, including personal medical history, family history, and any available genetic information that may result in a personalized plan for cancer prevention and surveillance.  RECOMMENDATIONS FOR FAMILY MEMBERS:  Relatives in this family might be at some increased risk of developing cancer, over the general population risk, simply due to the family history of cancer.  We recommended female relatives in  this family have a yearly mammogram beginning at age 35, or 38 years younger than the earliest onset of cancer, an annual clinical breast exam, and perform monthly breast self-exams. Female relatives in this family should also have a gynecological exam as recommended by their primary provider. All family members should have a colonoscopy by age 52, or as directed by their physicians.  FOLLOW-UP: Lastly, we discussed with Ms. Takacs that cancer genetics is a rapidly advancing field and it is possible that new genetic tests will be appropriate for her and/or her family members in the future. We encouraged her to remain in contact with cancer genetics on an annual basis so we can update her personal and family histories and let her know of advances in cancer genetics that may benefit this family.   Our contact number was provided. Ms. Farler questions were answered to her satisfaction, and she knows she is welcome to call us at anytime with additional questions or concerns.   Faith Rogue, MS, [facility] Genetic Counselor [email or web address] Phone: [phone]

## 2019-04-25 NOTE — Telephone Encounter (Signed)
Confirmed 12/9 telephone nutrition consult with patient.

## 2019-04-25 NOTE — Telephone Encounter (Signed)
Revealed negative germline and somatic testing. This normal result is reassuring and indicates that it is unlikely Stacy Rivas's cancer is due to a hereditary cause.  It is unlikely that there is an increased risk of another cancer due to a mutation in one of these genes.  However, genetic testing is not perfect, and cannot definitively rule out a hereditary cause.  It will be important for her to keep in contact with genetics to learn if any additional testing may be needed in the future.

## 2019-05-01 ENCOUNTER — Inpatient Hospital Stay: Payer: Managed Care, Other (non HMO) | Attending: Gynecologic Oncology | Admitting: Nutrition

## 2019-05-01 DIAGNOSIS — C786 Secondary malignant neoplasm of retroperitoneum and peritoneum: Secondary | ICD-10-CM | POA: Insufficient documentation

## 2019-05-01 DIAGNOSIS — Z79899 Other long term (current) drug therapy: Secondary | ICD-10-CM | POA: Insufficient documentation

## 2019-05-01 DIAGNOSIS — C562 Malignant neoplasm of left ovary: Secondary | ICD-10-CM | POA: Insufficient documentation

## 2019-05-01 DIAGNOSIS — G620 Drug-induced polyneuropathy: Secondary | ICD-10-CM | POA: Insufficient documentation

## 2019-05-01 NOTE — Progress Notes (Signed)
63 year old female diagnosed with ovarian cancer in July 2020.  She is a patient of Dr. Alvy Bimler.  Past medical history includes IBS, hyperlipidemia, reflux, diverticulosis, and anxiety.  Medications include Xanax, vitamin D3, Decadron, Nexium, multivitamin, Zofran, MiraLAX, Compazine, and Senokot-S.  Labs include glucose 137 on November 30.  Height: 5 feet 2 inches. Weight: 135.3 pounds. Usual body weight: 155 pounds in March. BMI: 24.75.   Patient is requesting survivorship nutrition information.  She has completed 6 cycles of chemotherapy.  Nutrition diagnosis: Food and nutrition related knowledge deficit related to ovarian cancer and associated treatments as evidenced by no prior need for nutrition related information.  Intervention: Patient educated to consume a healthy plant-based diet consisting of whole grains, 5-7 servings of fruits and vegetables daily as well as lean protein sources. Reviewed information on added sugars, food safety, and iron per patient request. Questions were answered.  Teach back method used.  Contact information and fact sheets will be mailed to patient at home address.  Monitoring, evaluation, goals: Patient will tolerate a healthy plant-based diet to reduce risk of recurrence of ovarian cancer.  Nutrition diagnosis resolved.

## 2019-05-20 ENCOUNTER — Encounter: Payer: Self-pay | Admitting: *Deleted

## 2019-05-22 ENCOUNTER — Ambulatory Visit (HOSPITAL_COMMUNITY)
Admission: RE | Admit: 2019-05-22 | Discharge: 2019-05-22 | Disposition: A | Discharge status: Home / Self Care | Payer: Managed Care, Other (non HMO) | Source: Ambulatory Visit | Attending: Hematology and Oncology | Admitting: Hematology and Oncology

## 2019-05-22 ENCOUNTER — Inpatient Hospital Stay: Payer: Managed Care, Other (non HMO)

## 2019-05-22 ENCOUNTER — Encounter (HOSPITAL_COMMUNITY): Payer: Self-pay

## 2019-05-22 ENCOUNTER — Other Ambulatory Visit: Payer: Self-pay

## 2019-05-22 DIAGNOSIS — C562 Malignant neoplasm of left ovary: Secondary | ICD-10-CM | POA: Diagnosis present

## 2019-05-22 DIAGNOSIS — C569 Malignant neoplasm of unspecified ovary: Secondary | ICD-10-CM

## 2019-05-22 HISTORY — DX: Malignant (primary) neoplasm, unspecified: C80.1

## 2019-05-22 LAB — CMP (CANCER CENTER ONLY)
ALT: 60 U/L — ABNORMAL HIGH (ref 0–44)
AST: 46 U/L — ABNORMAL HIGH (ref 15–41)
Albumin: 4 g/dL (ref 3.5–5.0)
Alkaline Phosphatase: 162 U/L — ABNORMAL HIGH (ref 38–126)
Anion gap: 10 (ref 5–15)
BUN: 11 mg/dL (ref 8–23)
CO2: 26 mmol/L (ref 22–32)
Calcium: 9.1 mg/dL (ref 8.9–10.3)
Chloride: 107 mmol/L (ref 98–111)
Creatinine: 0.6 mg/dL (ref 0.44–1.00)
GFR, Est AFR Am: >60 mL/min (ref >60)
GFR, Estimated: >60 mL/min (ref >60)
Glucose, Bld: 95 mg/dL (ref 70–99)
Potassium: 3.8 mmol/L (ref 3.5–5.1)
Sodium: 143 mmol/L (ref 135–145)
Total Bilirubin: 1.3 mg/dL — ABNORMAL HIGH (ref 0.3–1.2)
Total Protein: 6.5 g/dL (ref 6.5–8.1)

## 2019-05-22 LAB — CBC WITH DIFFERENTIAL (CANCER CENTER ONLY)
Abs Immature Granulocytes: 0.01 10*3/uL (ref 0.00–0.07)
Basophils Absolute: 0 10*3/uL (ref 0.0–0.1)
Basophils Relative: 0 %
Eosinophils Absolute: 0.1 10*3/uL (ref 0.0–0.5)
Eosinophils Relative: 1 %
HCT: 35.3 % — ABNORMAL LOW (ref 36.0–46.0)
Hemoglobin: 11.8 g/dL — ABNORMAL LOW (ref 12.0–15.0)
Immature Granulocytes: 0 %
Lymphocytes Relative: 31 %
Lymphs Abs: 1.4 10*3/uL (ref 0.7–4.0)
MCH: 31.7 pg (ref 26.0–34.0)
MCHC: 33.4 g/dL (ref 30.0–36.0)
MCV: 94.9 fL (ref 80.0–100.0)
Monocytes Absolute: 0.4 10*3/uL (ref 0.1–1.0)
Monocytes Relative: 9 %
Neutro Abs: 2.7 10*3/uL (ref 1.7–7.7)
Neutrophils Relative %: 59 %
Platelet Count: 216 10*3/uL (ref 150–400)
RBC: 3.72 MIL/uL — ABNORMAL LOW (ref 3.87–5.11)
RDW: 14.2 % (ref 11.5–15.5)
WBC Count: 4.6 10*3/uL (ref 4.0–10.5)
nRBC: 0 % (ref 0.0–0.2)

## 2019-05-22 MED ORDER — SODIUM CHLORIDE (PF) 0.9 % IJ SOLN
INTRAMUSCULAR | Status: AC
  Filled 2019-05-22: qty 50

## 2019-05-22 MED ORDER — HEPARIN SOD (PORK) LOCK FLUSH 100 UNIT/ML IV SOLN
INTRAVENOUS | Status: AC
  Filled 2019-05-22: qty 5

## 2019-05-22 MED ORDER — SODIUM CHLORIDE 0.9% FLUSH
10.0000 mL | Freq: Once | INTRAVENOUS | Status: AC
  Administered 2019-05-22: 10 mL
  Filled 2019-05-22: qty 10

## 2019-05-22 MED ORDER — HEPARIN SOD (PORK) LOCK FLUSH 100 UNIT/ML IV SOLN
500.0000 [IU] | Freq: Once | INTRAVENOUS | Status: AC
  Administered 2019-05-22: 10:00:00 500 [IU] via INTRAVENOUS

## 2019-05-22 MED ORDER — IOHEXOL 300 MG/ML  SOLN
100.0000 mL | Freq: Once | INTRAMUSCULAR | Status: AC | PRN
  Administered 2019-05-22: 10:00:00 100 mL via INTRAVENOUS

## 2019-05-22 NOTE — Patient Instructions (Signed)

## 2019-05-23 ENCOUNTER — Telehealth: Payer: Self-pay | Admitting: Hematology and Oncology

## 2019-05-23 ENCOUNTER — Other Ambulatory Visit: Payer: Self-pay

## 2019-05-23 ENCOUNTER — Inpatient Hospital Stay (HOSPITAL_BASED_OUTPATIENT_CLINIC_OR_DEPARTMENT_OTHER): Payer: Managed Care, Other (non HMO) | Admitting: Hematology and Oncology

## 2019-05-23 ENCOUNTER — Encounter: Payer: Self-pay | Admitting: Hematology and Oncology

## 2019-05-23 VITALS — BP 139/77 | HR 82 | Temp 98.0°F | Resp 18 | Ht 62.0 in | Wt 137.4 lb

## 2019-05-23 DIAGNOSIS — R748 Abnormal levels of other serum enzymes: Secondary | ICD-10-CM

## 2019-05-23 DIAGNOSIS — C562 Malignant neoplasm of left ovary: Secondary | ICD-10-CM | POA: Diagnosis present

## 2019-05-23 DIAGNOSIS — T451X5A Adverse effect of antineoplastic and immunosuppressive drugs, initial encounter: Secondary | ICD-10-CM

## 2019-05-23 DIAGNOSIS — C786 Secondary malignant neoplasm of retroperitoneum and peritoneum: Secondary | ICD-10-CM | POA: Diagnosis not present

## 2019-05-23 DIAGNOSIS — Z79899 Other long term (current) drug therapy: Secondary | ICD-10-CM | POA: Diagnosis not present

## 2019-05-23 DIAGNOSIS — G620 Drug-induced polyneuropathy: Secondary | ICD-10-CM | POA: Diagnosis not present

## 2019-05-23 LAB — CA 125: Cancer Antigen (CA) 125: 5.7 U/mL (ref 0.0–38.1)

## 2019-05-23 NOTE — Progress Notes (Signed)
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) She have completed 6 cycles of treatment with mild residual peripheral neuropathy and fatigue Her tumor marker and CT scan were negative for disease We discussed future follow-up She will see Dr. Denman George with blood work & physical examination in 3 months and I will see her again in 6 months She is educated to watch for signs and symptoms for cancer recurrence We discussed resumption of age-appropriate screening program in the future We discussed port removal.   Peripheral neuropathy due to chemotherapy [facility]) She has mild residual peripheral neuropathy but it is not debilitating She has some fatigue I recommend referral to cancer rehab and she agreed  Elevated liver enzymes She has mild persistent elevated liver enzymes likely due to recent treatment CT scan of the liver is normal I suspect it will improve in time We will recheck it again in the future.   Orders Placed This Encounter  Procedures  . IR REMOVAL TUN ACCESS W/ PORT W/O FL MOD SED    Standing Status:   Future    Standing Expiration Date:   07/20/2020    Order Specific Question:   Reason for exam:    Answer:   chemo is completed, ok to remove port    Order Specific Question:   Preferred Imaging Location?    Answer:   [facility]  . Ambulatory referral to Physical Therapy    Referral Priority:   Routine    Referral Type:   Physical Medicine    Referral Reason:   Specialty Services Required    Requested Specialty:   Physical Therapy    Number of Visits Requested:   1    INTERVAL HISTORY: Please see below for problem oriented charting. 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 14.7 cm poorly defined soft tissue mass in central pelvis suspicious for primary ovarian carcinoma, with uterine leiomyosarcoma considered a less likely differential diagnosis. 2. Moderate ascites and diffuse peritoneal carcinomatosis. 3. Several small uterine fibroids.   11/13/2018 Tumor Marker   Patient's tumor was tested for the following markers: CA-125 Results of the tumor marker test revealed 1434   11/21/2018 Procedure   Successful ultrasound-guided diagnostic and therapeutic paracentesis yielding 3.1 liters of peritoneal fluid   11/21/2018 Pathology Results   PERITONEAL/ASCITIC FLUID(SPECIMEN 1 OF 1 COLLECTED 11/21/18): ADENOCARCINOMA. Specimen Clinical Information Pelvic mass suspicious for ovarian cancer Source Peritoneal/Ascitic Fluid, (specimen 1 of 1 collected 11/21/18) Gross Specimen: Received is/are 1000 ccs of dark amber fluid. (CM:cm) Prepared: # Smears: 0 # Concentration Technique Slides (i.e. 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   11/29/2018 Initial Diagnosis   Ovarian cancer ([place])   11/29/2018 Cancer Staging   Staging form: Ovary, Fallopian Tube, and Primary Peritoneal Carcinoma, AJCC 8th Edition - Clinical: cT3, cN0, cM0 - Signed by [name] Lark, MD on 11/29/2018   12/03/2018 Procedure   Successful  ultrasound-guided therapeutic paracentesis yielding 3.7 liters of peritoneal fluid.     12/06/2018 Procedure   Placement of a subcutaneous port device. Catheter tip at the SVC and right atrium junction   12/14/2018 Procedure   Successful ultrasound-guided paracentesis yielding 2.9 L of peritoneal fluid   12/28/2018 Tumor Marker    Patient's tumor was tested for the following markers: CA-125 Results of the tumor marker test revealed 812.   12/28/2018 -  Chemotherapy   The patient had carboplatin and taxol for chemotherapy treatment.     02/01/2019 Imaging   Ct abdomen and pelvis 8.6 cm left ovarian mass, corresponding to the patient's known primary neoplasm, improved.   Mild peritoneal nodularity/omental caking, improved.   Small abdominopelvic ascites, improved.     02/01/2019 Tumor Marker   Patient's tumor was tested for the following markers: CA-125 Results of the tumor marker test revealed 183.   02/12/2019 Surgery   Preoperative Diagnosis: Stage IIIC ovarian cancer, s/p neoadjuvant chemotherapy      Procedure(s) Performed: 1. Exploratory laparotomy with total abdominal hysterectomy, bilateral salpingo-oophorectomy, omentectomy radical tumor debulking for ovarian cancer.   Surgeon: Thereasa Solo, MD.    Operative Findings: upper abdomen free of disease. No visible omental disease. Small volume (200cc) ascites. 8cm friable mass replacing left ovary and adherent to the sigmoid colon mesentery and ureter on the left.  Anterior fibroid. This represented an optimal cytoreduction (R0) with no gross visible disease remaining.    02/12/2019 Pathology Results   A. OVARY AND FALLOPIAN TUBE, LEFT, SALPINGO-OOPHORECTOMY: - Serous carcinoma, high grade, status post neoadjuvant therapy - See oncology table and comment below B. UTERUS CERVIX WITH RIGHT FALLOPIAN TUBE AND OVARY, HYSTERECTOMY: Uterus: - Serosal surface involved by serous carcinoma - Endomyometrium uninvolved by carcinoma - Benign endometrial polyp (4.1 cm) - Leiomyomata (5.5 cm; largest) - Adenomyosis Cervix: - Uninvolved by carcinoma Left ovary: - Serous carcinoma, high grade Left fallopian tube: - Serous carcinoma, high grade C. SOFT TISSUE, LEFT PELVIC SIDEWALL TUMOR, EXCISION: - Metastatic serous carcinoma, high-grade D. SOFT TISSUE, SIGMOID  COLON MESENTERY, EXCISION: - Metastatic serous carcinoma, high-grade E. OMENTUM, TUMOR RESECTION: - Metastatic serous carcinoma, high-grade OVARY or FALLOPIAN TUBE or PRIMARY PERITONEUM: Procedure: Total hysterectomy and bilateral salpingo-oophorectomy, omentectomy and peritoneal biopsies Specimen Integrity: Fragmented Tumor Site: Left ovary and fallopian tube Ovarian Surface Involvement: Present Fallopian Tube Surface Involvement: Present Tumor Size: 6.3 cm (see comment) Histologic Type: Serous carcinoma Histologic Grade: High-grade Implants: Not applicable Other Tissue/ Organ Involvement: Right ovary, right fallopian tube, omentum, mesentery Largest Extrapelvic Peritoneal Focus: Macroscopic Peritoneal/Ascitic Fluid: Malignant Treatment Effect: No definite or minimal response identified (chemotherapy response score 1 [CRS1] Regional Lymph Nodes: No lymph nodes submitted or found Pathologic Stage Classification (pTNM, AJCC 8th Edition): pT3c, pNX Representative Tumor Block: A4 Comment(s): Additional testing (HER-2, MMR and MSI) are pending. The primary tumor site appears to be the left ovary and fallopian tube. The uterus is only involved on the serosal surface. There is tumor on the anterior peritoneal reflection.  Addendum: Tumor is Her2 negative,MSI stable   02/19/2019 Tumor Marker   Patient's tumor was tested for the following markers: CA-125 Results of the tumor marker test revealed 40.3   04/01/2019 Tumor Marker   Patient's tumor was tested for the following markers: CA-125 Results of the tumor marker test revealed 8.1    Genetic Testing   Negative testing. No pathogenic variants identified on the [employer]. The report date is 04/17/2019.  Somatic genes analyzed through [facility]:  ATM, BARD1, BRCA1, BRCA2, BRIP1, CHEK2, MRE11A, NBN, PALB2, RAD51C, RAD51D.  The [facility] gene panel offered by [employer] includes sequencing and rearrangement  analysis for the following 36 genes: APC*, ATM*, AXIN2, BARD1, BMPR1A, BRCA1*, BRCA2*, BRIP1*, CDH1*, CDK4, CDKN2A, CHEK2*, DICER1, MLH1*, MSH2*, MSH3, MSH6*, MUTYH*, NBN, NF1*, NTHL1, PALB2*, PMS2*, PTEN*, RAD51C*, RAD51D*, RECQL, SMAD4, SMARCA4, STK11 and TP53* (sequencing and deletion/duplication); HOXB13, POLD1 and POLE (sequencing only); EPCAM and GREM1 (deletion/duplication only).    05/21/2019 Imaging   1. Interval hysterectomy, oophorectomy and omentectomy. No evidence of residual ovarian carcinoma in the pelvis. No evidence of peritoneal disease. No intraperitoneal free fluid. 2. Nonobstructing LEFT renal calculi.  Normal ureters.   05/22/2019 Tumor Marker   Patient's tumor was tested for the following markers: CA-125 Results of the tumor marker test revealed 5.7.     REVIEW OF SYSTEMS:   Constitutional: Denies fevers, chills or abnormal weight loss Eyes: Denies blurriness of vision Ears, nose, mouth, throat, and face: Denies mucositis or sore throat Respiratory: Denies cough, dyspnea or wheezes Cardiovascular: Denies palpitation, chest discomfort or lower extremity swelling Skin: Denies abnormal skin rashes Lymphatics: Denies new lymphadenopathy or easy bruising Behavioral/Psych: Mood is stable, no new changes  All other systems were reviewed with the patient and are negative.  I have reviewed the past medical history, past surgical history, social history and family history with the patient and they are unchanged from previous note.  ALLERGIES:  is allergic to shrimp [shellfish allergy] and sulfonamide derivatives.  MEDICATIONS:  Current Outpatient Medications  Medication Sig Dispense Refill  . acetaminophen (TYLENOL) 500 MG tablet Take 500 mg by mouth every 6 (six) hours as needed for mild pain.    [name] Kitchen ALPRAZolam (XANAX) 0.25 MG tablet Take 1 tablet (0.25 mg total) by mouth at bedtime as needed for anxiety. (Patient taking differently: Take 0.125 mg by mouth at bedtime as  needed for anxiety. ) 15 tablet 0  . carboxymethylcellulose (REFRESH PLUS) 0.5 % SOLN Place 1 drop into both eyes 2 (two) times daily as needed (dry eyes).    . Cholecalciferol (VITAMIN D3) 25 MCG (1000 UT) CAPS Take 1,000 Units by mouth daily.     . diphenhydrAMINE (BENADRYL) 25 mg capsule Take 25 mg by mouth at bedtime as needed for sleep.    [name] Kitchen esomeprazole (NEXIUM) 20 MG capsule Take 20 mg by mouth daily.     [name] Kitchen loratadine (CLARITIN) 10 MG tablet Take 10 mg by mouth daily as needed for allergies.    . Multiple Vitamin (MULTIVITAMIN WITH MINERALS) TABS tablet Take 1 tablet by mouth daily.    [name] Kitchen senna-docusate (SENOKOT-S) 8.6-50 MG tablet Take 2 tablets by mouth at bedtime. For AFTER surgery, do not take if having diarrhea 30 tablet 1   No current facility-administered medications for this visit.    PHYSICAL EXAMINATION: ECOG PERFORMANCE STATUS: 1 - Symptomatic but completely ambulatory  Vitals:   05/23/19 0826  BP: 139/77  Pulse: 82  Resp: 18  Temp: 98 F (36.7 C)  SpO2: 98%   Filed Weights   05/23/19 0826  Weight: 137 lb 6.4 oz (62.3 kg)    GENERAL:alert, no distress and comfortable Musculoskeletal:no cyanosis of digits and no clubbing  NEURO: alert & oriented x 3 with fluent speech, no focal motor/sensory deficits  LABORATORY DATA:  I have reviewed the data as listed    Component Value Date/Time   NA 143 05/22/2019 0828   K 3.8 05/22/2019 0828   CL 107 05/22/2019 0828  CO2 26 05/22/2019 0828   GLUCOSE 95 05/22/2019 0828   BUN 11 05/22/2019 0828   CREATININE 0.60 05/22/2019 0828   CALCIUM 9.1 05/22/2019 0828   PROT 6.5 05/22/2019 0828   ALBUMIN 4.0 05/22/2019 0828   AST 46 (H) 05/22/2019 0828   ALT 60 (H) 05/22/2019 0828   ALKPHOS 162 (H) 05/22/2019 0828   BILITOT 1.3 (H) 05/22/2019 0828   GFRNONAA >60 05/22/2019 0828   GFRAA >60 05/22/2019 0828    No results found for: SPEP, UPEP  Lab Results  Component Value Date   WBC 4.6 05/22/2019   NEUTROABS 2.7  05/22/2019   HGB 11.8 (L) 05/22/2019   HCT 35.3 (L) 05/22/2019   MCV 94.9 05/22/2019   PLT 216 05/22/2019      Chemistry      Component Value Date/Time   NA 143 05/22/2019 0828   K 3.8 05/22/2019 0828   CL 107 05/22/2019 0828   CO2 26 05/22/2019 0828   BUN 11 05/22/2019 0828   CREATININE 0.60 05/22/2019 0828      Component Value Date/Time   CALCIUM 9.1 05/22/2019 0828   ALKPHOS 162 (H) 05/22/2019 0828   AST 46 (H) 05/22/2019 0828   ALT 60 (H) 05/22/2019 0828   BILITOT 1.3 (H) 05/22/2019 0828       RADIOGRAPHIC STUDIES: I have reviewed multiple imaging studies with the patient I have personally reviewed the radiological images as listed and agreed with the findings in the report. CT Abdomen Pelvis W Contrast  Result Date: 05/22/2019 CLINICAL DATA:  Ovarian carcinoma. Chemotherapy complete. Surgical hysterectomy, oophorectomy and omentectomy radical tumor debulking 02/12/2019. EXAM: CT ABDOMEN AND PELVIS WITH CONTRAST TECHNIQUE: Multidetector CT imaging of the abdomen and pelvis was performed using the standard protocol following bolus administration of intravenous contrast. CONTRAST:  18m OMNIPAQUE IOHEXOL 300 MG/ML  SOLN COMPARISON:  CT 02/01/2019 FINDINGS: Lower chest: Lung bases are clear. Hepatobiliary: No focal hepatic lesion. No biliary duct dilatation. Gallbladder is normal. Common bile duct is normal. Pancreas: Pancreas is normal. No ductal dilatation. No pancreatic inflammation. Spleen: Normal spleen Adrenals/urinary tract: Adrenal glands normal. No hydronephrosis. Nonobstructing LEFT renal calculi. Ureters and bladder normal. Stomach/Bowel: Stomach, small bowel, appendix, and cecum are normal. The colon and rectosigmoid colon are normal. Vascular/Lymphatic: Abdominal aorta normal caliber. No retroperitoneal periportal adenopathy. No pelvic lymphadenopathy Reproductive: Post hysterectomy and oophorectomy. Interval resection of large LEFT ovarian mass. No residual tumor  evident in the pelvis. Other: Interval omentectomy. No nodularity of the peritoneal surface. No free fluid in the abdomen or pelvis. Postsurgical change at the abdominal wall consistent with laparoscopy. Musculoskeletal: No aggressive osseous lesion. IMPRESSION: 1. Interval hysterectomy, oophorectomy and omentectomy. No evidence of residual ovarian carcinoma in the pelvis. No evidence of peritoneal disease. No intraperitoneal free fluid. 2. Nonobstructing LEFT renal calculi.  Normal ureters. Electronically Signed   By: SSuzy BouchardM.D.   On: 05/22/2019 11:58    All questions were answered. The patient knows to call the clinic with any problems, questions or concerns. No barriers to learning was detected.  I spent 15 minutes counseling the patient face to face. The total time spent in the appointment was 20 minutes and more than 50% was on counseling and review of test results  NHeath Lark MD 05/23/2019 8:52 AM

## 2019-05-23 NOTE — Assessment & Plan Note (Signed)
She has mild persistent elevated liver enzymes likely due to recent treatment CT scan of the liver is normal I suspect it will improve in time We will recheck it again in the future.

## 2019-05-23 NOTE — Telephone Encounter (Signed)
Scheduled appt per 12/31 sch message - mailed reminder letter with appt date and time

## 2019-05-23 NOTE — Assessment & Plan Note (Signed)
She has mild residual peripheral neuropathy but it is not debilitating She has some fatigue I recommend referral to cancer rehab and she agreed

## 2019-05-23 NOTE — Assessment & Plan Note (Signed)
She have completed 6 cycles of treatment with mild residual peripheral neuropathy and fatigue Her tumor marker and CT scan were negative for disease We discussed future follow-up She will see Dr. Denman George with blood work & physical examination in 3 months and I will see her again in 6 months She is educated to watch for signs and symptoms for cancer recurrence We discussed resumption of age-appropriate screening program in the future We discussed port removal.

## 2019-05-27 ENCOUNTER — Ambulatory Visit: Payer: Managed Care, Other (non HMO) | Attending: Hematology and Oncology

## 2019-05-27 ENCOUNTER — Other Ambulatory Visit: Payer: Self-pay

## 2019-05-27 ENCOUNTER — Telehealth: Payer: Self-pay | Admitting: *Deleted

## 2019-05-27 DIAGNOSIS — G620 Drug-induced polyneuropathy: Secondary | ICD-10-CM | POA: Insufficient documentation

## 2019-05-27 DIAGNOSIS — T451X5A Adverse effect of antineoplastic and immunosuppressive drugs, initial encounter: Secondary | ICD-10-CM | POA: Insufficient documentation

## 2019-05-27 DIAGNOSIS — C562 Malignant neoplasm of left ovary: Secondary | ICD-10-CM

## 2019-05-27 NOTE — Telephone Encounter (Signed)
Called and left the patient a message to call the office back. Ned to schedule a follow up MD appt with labs for the end of March

## 2019-05-27 NOTE — Addendum Note (Signed)
Addended by: Ander Purpura on: 05/27/2019 03:34 PM   Modules accepted: Orders

## 2019-05-27 NOTE — Therapy (Signed)
[place], [place], [address] Phone: [phone]   Fax:  [phone]  Physical Therapy Evaluation  Patient Details  Name: Stacy Rivas MRN: [id number] Date of Birth: [birth year] Referring Provider (PT): [name] Lark    Encounter Date: 05/27/2019  PT End of Session - 05/27/19 1413    Visit Number  1    Number of Visits  9    Date for PT Re-Evaluation  07/01/19    PT Start Time  6301    PT Stop Time  1510    PT Time Calculation (min)  57 min    Activity Tolerance  Patient tolerated treatment well    Behavior During Therapy  [facility] for tasks assessed/performed       Past Medical History:  Diagnosis Date  . Anxiety state, unspecified   . Diverticulosis   . Esophageal reflux   . Family history of breast cancer   . Family history of lymphoma   . Family history of stomach cancer   . Family history of uterine cancer   . Fibroid tumor   . Headache    due to allergies   . Hiatal hernia   . Hyperlipidemia   . IBS (irritable bowel syndrome)   . Internal hemorrhoids   . Nonspecific elevation of levels of transaminase or lactic acid dehydrogenase (LDH)   . ovarian ca dx'd 11/2018  . Renal stone 07/2013  . Tubulovillous adenoma of colon   . Varicose veins    superficail thrombophlebitis (left LE)    Past Surgical History:  Procedure Laterality Date  . CATARACT EXTRACTION, BILATERAL    . cystoscopy with instillation for cystitis  2001  . DEBULKING N/A 02/12/2019   Procedure: DEBULKING OF TUMOR;  Surgeon: Everitt Amber, MD;  Location: [facility];  Service: Gynecology;  Laterality: N/A;  . DILATION AND CURETTAGE OF UTERUS     miscarriage  . HYSTERECTOMY ABDOMINAL WITH SALPINGO-OOPHORECTOMY Bilateral 02/12/2019   Procedure: HYSTERECTOMY ABDOMINAL WITH SALPINGO-OOPHORECTOMY;  Surgeon: Everitt Amber, MD;  Location: [facility];  Service: Gynecology;  Laterality: Bilateral;  . IR IMAGING GUIDED PORT INSERTION  12/06/2018   . IR PARACENTESIS  12/03/2018  . IR PARACENTESIS  12/14/2018  . LAPAROSCOPY     adhesions  . LITHOTRIPSY  07/2013   x 2  . MYOMECTOMY  1992  . OMENTECTOMY N/A 02/12/2019   Procedure: OMENTECTOMY;  Surgeon: Everitt Amber, MD;  Location: [facility];  Service: Gynecology;  Laterality: N/A;  . PARACENTESIS  11/21/2018   abdominal , removed 3.1 Liters     There were no vitals filed for this visit.   Subjective Assessment - 05/27/19 1423    Subjective  Pt reports that she had a total hysterectomy on september 22nd, 2020. She had chemotherapy before and after her hysterectomy with 04/22/2019 as her last chemotherapy session. She states that she did not have radiation. Pt reports that right after surgery she started notice changes in her urination stream including instead of a straight stream her stream splashes all over. She reports that she has random pains in her abdomen that will go away. She has pin and needle sensation in her fingers and toes that improves with warmth.    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 Hx of shings in April of 2020    Currently in Pain?  No/denies   Pt reports pins/needles in her fingers and toes 3/10  [facility] PT Assessment - 05/27/19 0001      Assessment   Medical Diagnosis  Abdominal Hysterectomy with oopherectomy     Referring Provider (PT)  Ni Gorsuch     Onset Date/Surgical Date  02/12/19    Hand Dominance  Right      Balance Screen   Has the patient fallen in the past 6 months  No    Has the patient had a decrease in activity level because of a fear of falling?   Yes    Is the patient reluctant to leave their home because of a fear of falling?   No      Home Environment   Living Environment  Private residence    Living Arrangements  Spouse/significant other    Type of [place] to enter    Home Layout  Two level    Additional Comments  Pt  reports no difficulty ascending/descending stairs.       Prior Function   Level of Independence  Independent    Vocation  Full time employment    Vocation Requirements  Pt is in [employer] and uses her computer and phone alot for work    Leisure  knitting, sewing, golfing       Cognition   Overall Cognitive Status  Within Functional Limits for tasks assessed      Posture/Postural Control   Posture/Postural Control  Postural limitations    Postural Limitations  Forward head      ROM / Strength   AROM / PROM / Strength  Strength      Strength   Strength Assessment Site  Hip;Knee;Ankle    Right/Left Hip  Right;Left    Right Hip Flexion  5/5    Right Hip ABduction  5/5    Right Hip ADduction  5/5    Left Hip Flexion  5/5    Left Hip ABduction  5/5    Left Hip ADduction  5/5    Right/Left Knee  Right;Left    Right Knee Flexion  5/5    Right Knee Extension  5/5    Left Knee Flexion  5/5    Left Knee Extension  5/5    Right/Left Ankle  Right;Left    Right Ankle Dorsiflexion  5/5    Right Ankle Plantar Flexion  5/5    Left Ankle Dorsiflexion  5/5    Left Ankle Plantar Flexion  5/5        LYMPHEDEMA/ONCOLOGY QUESTIONNAIRE - 05/27/19 1450      Type   Cancer Type  L ovarian epithelial cancer       Surgeries   Other Surgery Date  02/12/19    Number Lymph Nodes Removed  0      Treatment   Active Chemotherapy Treatment  No    Past Chemotherapy Treatment  Yes    Date  04/22/19    Active Radiation Treatment  No    Past Radiation Treatment  No      What other symptoms do you have   Are you Having Heaviness or Tightness  No    Are you having Pain  No    Are you having pitting edema  No    Is it Hard or Difficult finding clothes that fit  No    Do you have infections  No    Is there Decreased scar mobility  Yes      Lymphedema Assessments   Lymphedema  Assessments  Lower extremities      Right Lower Extremity Lymphedema   20 cm Proximal to Suprapatella  54 cm    30 cm  Proximal to Floor at Lateral Plantar Foot  37 cm    10 cm Proximal to Floor at Lateral Malleoli  19 cm    5 cm Proximal to 1st MTP Joint  19.9 cm      Left Lower Extremity Lymphedema   20 cm Proximal to Suprapatella  51.4 cm    30 cm Proximal to Floor at Lateral Plantar Foot  36 cm    10 cm Proximal to Floor at Lateral Malleoli  19.9 cm    5 cm Proximal to 1st MTP Joint  19.8 cm             Objective measurements completed on examination: See above findings.      [facility] PT Treatment/Exercise - 05/27/19 0001      Exercises   Exercises  Other Exercises    Other Exercises   transverse abodminus activation in supine with knees bent tactile cueing at the transverse abdominus and low back with VC in order to prevent compensation with glutes or rectus abdominus and avoid diaphragmatic statis to assist.              PT Education - 05/27/19 1511    Education Details  Pt was educated scar tissue/adhesions, weakness/edema following surgery. Discussed risk for lymphedema including she is at a relatively low risk due to no radiation or lymph node removal but scar tissue could cause blockage. Pt was educated on core activation and the importance of core activation with other activities in order to improve strength. Discussed cancer rehab vs. pelvic floor therapy with patient and that her concerns will be met better by a pelvic floor therapist at this time. She does have some concerns about neuropathy but her neuropathy improves with warmth. She was educated on using gloves and warm packs on her hands/feet in order to decrease tingling since this seems to help alleviate her symptoms including utilizing fingerless gloves while at work to improve circulation and decrease symptoms.    Person(s) Educated  Patient    Methods  Explanation;Demonstration;Tactile cues;Verbal cues;Handout    Comprehension  Verbalized understanding;Returned demonstration       PT Short Term Goals - 05/27/19  1529      PT SHORT TERM GOAL #1   Title  Pt will be independent with her inital HEP    Baseline  pt did not have an HEP    Time  1    Period  Weeks    Status  New    Target Date  06/10/19        PT Long Term Goals - 05/27/19 1529      PT LONG TERM GOAL #1   Title  Pt will demonstrate improve transverse abdominus activation to 10 seconds with out tactile cueing 5x while in sitting in order to demonstrate improve strength.    Baseline  pt is able to hold transverse abdominus with heavy tactile/VC for 5 seconds or less.    Time  4    Period  Weeks    Status  New    Target Date  07/01/19      PT LONG TERM GOAL #2   Title  Pt will report 25% improvement in pain in her lower R abdominal quadrant in order to improve quality of life and decrease risk for immobility within 4  weeks.    Baseline  pt reports occasional shooting pain in her abdomen.    Time  4    Period  Weeks    Status  New    Target Date  07/01/19             Plan - 05/27/19 1517    Clinical Impression Statement  Pt presents to physical therapy with reports of problems related to her urination stream following a abdominal hysterectomy with salpingo oophorectomy.  She presents with an incision on the R abdominal wall with significant adhesions noted and significant weakness in the R transverse abdominus compared to the L as demonstrated by difficulty maintaining contraction on the R with small hip flexion. Pt is reporting an occasional burning pain up by her diaphragm that could potentially be related to scar tissue/adhesions. She did have shingles earlier last year that may be contributing to her pain. Pt presents with no noted deficits in Bil LE strength but does have a decrease in endurance due to pt is reporting fatigue with activities that she was previously able to perform. Pt does have slight increase in R circumferential measurements of the thigh compared to the L just below the groin and discussed wearing  compression pants at this time due to possible post-surgical edema in this area. She was educated on reasons to return to lymphedema physical therapy including aching, heaviness or increased edema in this area. At this time pt requires the services of a pelvic floor physical therapist and was agreeable to going to the [facility] due to her urination stream and core adhesions/pain are her main concern currently. If she requires any further help from cancer rehab after pelvic floor therapy she will return. Pt will benefit from skilled physical therapy services 2x/week for 4 weeks in order to address the above limitations.    Personal Factors and Comorbidities  Comorbidity 2    Comorbidities  History of shingles with pain under the ribs, Hx of abodminal hysterectomy with salpingo oopherectomy    Stability/Clinical Decision Making  Stable/Uncomplicated    Clinical Decision Making  Low    Rehab Potential  Good    PT Frequency  2x / week    PT Duration  4 weeks    PT Treatment/Interventions  ADLs/Self Care Home Management;Biofeedback;Electrical Stimulation;Iontophoresis '4mg'$ /ml Dexamethasone;Neuromuscular re-education;Patient/family education;Manual lymph drainage;Therapeutic exercise;Therapeutic activities    PT Next Visit Plan  pt will go to pelvic floor physical therapy    PT Home Exercise Plan  Access Code: [email or web address]    Consulted and Agree with Plan of Care  Patient       Patient will benefit from skilled therapeutic intervention in order to improve the following deficits and impairments:     Visit Diagnosis: Left ovarian epithelial cancer ([place])  Peripheral neuropathy due to chemotherapy [facility])     Problem List Patient Active Problem List   Diagnosis Date Noted  . Genetic testing 04/25/2019  . Hyperglycemia, drug-induced 04/01/2019  . Elevated liver enzymes 04/01/2019  . Family history of breast cancer   . Family history of uterine cancer   . Family history of lymphoma   . Family  history of stomach cancer   . Preventive measure 03/05/2019  . Peripheral neuropathy due to chemotherapy ([place]) 01/17/2019  . Anemia due to antineoplastic chemotherapy 01/17/2019  . Arthralgia 12/28/2018  . Peritoneal carcinomatosis ([place]) 11/30/2018  . Goals of care, counseling/discussion 11/30/2018  . 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 FLATULENCE-GAS-BLOATING 05/29/2008  . ABDOMINAL  PAIN RIGHT UPPER QUADRANT 05/29/2008  . ABNORMAL TRANSAMINASE-LFT'S 05/29/2008  . ANXIETY 05/28/2008  . GERD 05/28/2008  . HIATAL HERNIA 05/28/2008  . IBS 05/28/2008    Ander Purpura, PT 05/27/2019, 3:32 PM  [place], [place], [address] Phone: [phone]   Fax:  [phone]  Name: Stacy Rivas MRN: [id number] Date of Birth: [birth year]

## 2019-05-27 NOTE — Patient Instructions (Signed)
Access Code: [email or web address]  URL: [email or web address]  Date: 05/27/2019  Prepared by: Tomma Rakers   Exercises Supine March with Posterior Pelvic Tilt - 10 reps - 2 sets - make sure that your back stays flat and you remember to breathe!! hold                            - 2x daily - 7x weekly

## 2019-05-29 ENCOUNTER — Telehealth: Payer: Self-pay | Admitting: *Deleted

## 2019-05-29 NOTE — Telephone Encounter (Signed)
Called and left the patient a message to call the office back. Patient needs an appt in March with labs

## 2019-05-30 ENCOUNTER — Other Ambulatory Visit: Payer: Self-pay

## 2019-05-30 DIAGNOSIS — C561 Malignant neoplasm of right ovary: Secondary | ICD-10-CM

## 2019-05-30 DIAGNOSIS — C563 Malignant neoplasm of bilateral ovaries: Secondary | ICD-10-CM

## 2019-05-31 ENCOUNTER — Other Ambulatory Visit: Payer: Self-pay | Admitting: Radiology

## 2019-06-03 ENCOUNTER — Other Ambulatory Visit: Payer: Self-pay

## 2019-06-03 ENCOUNTER — Encounter (HOSPITAL_COMMUNITY): Payer: Self-pay

## 2019-06-03 ENCOUNTER — Ambulatory Visit (HOSPITAL_COMMUNITY)
Admission: RE | Admit: 2019-06-03 | Discharge: 2019-06-03 | Disposition: A | Discharge status: Home / Self Care | Payer: Managed Care, Other (non HMO) | Source: Ambulatory Visit | Attending: Hematology and Oncology | Admitting: Hematology and Oncology

## 2019-06-03 DIAGNOSIS — K589 Irritable bowel syndrome without diarrhea: Secondary | ICD-10-CM | POA: Diagnosis not present

## 2019-06-03 DIAGNOSIS — Z452 Encounter for adjustment and management of vascular access device: Secondary | ICD-10-CM | POA: Insufficient documentation

## 2019-06-03 DIAGNOSIS — F419 Anxiety disorder, unspecified: Secondary | ICD-10-CM | POA: Diagnosis not present

## 2019-06-03 DIAGNOSIS — K219 Gastro-esophageal reflux disease without esophagitis: Secondary | ICD-10-CM | POA: Insufficient documentation

## 2019-06-03 DIAGNOSIS — Z9221 Personal history of antineoplastic chemotherapy: Secondary | ICD-10-CM | POA: Diagnosis not present

## 2019-06-03 DIAGNOSIS — C562 Malignant neoplasm of left ovary: Secondary | ICD-10-CM | POA: Insufficient documentation

## 2019-06-03 DIAGNOSIS — Z87891 Personal history of nicotine dependence: Secondary | ICD-10-CM | POA: Diagnosis not present

## 2019-06-03 HISTORY — PX: IR REMOVAL TUN ACCESS W/ PORT W/O FL MOD SED: IMG2290

## 2019-06-03 LAB — CBC WITH DIFFERENTIAL/PLATELET
Abs Immature Granulocytes: 0.01 10*3/uL (ref 0.00–0.07)
Basophils Absolute: 0 10*3/uL (ref 0.0–0.1)
Basophils Relative: 1 %
Eosinophils Absolute: 0.1 10*3/uL (ref 0.0–0.5)
Eosinophils Relative: 1 %
HCT: 39.4 % (ref 36.0–46.0)
Hemoglobin: 13 g/dL (ref 12.0–15.0)
Immature Granulocytes: 0 %
Lymphocytes Relative: 36 %
Lymphs Abs: 1.6 10*3/uL (ref 0.7–4.0)
MCH: 32.8 pg (ref 26.0–34.0)
MCHC: 33 g/dL (ref 30.0–36.0)
MCV: 99.5 fL (ref 80.0–100.0)
Monocytes Absolute: 0.4 10*3/uL (ref 0.1–1.0)
Monocytes Relative: 10 %
Neutro Abs: 2.3 10*3/uL (ref 1.7–7.7)
Neutrophils Relative %: 52 %
Platelets: 252 10*3/uL (ref 150–400)
RBC: 3.96 MIL/uL (ref 3.87–5.11)
RDW: 14.8 % (ref 11.5–15.5)
WBC: 4.4 10*3/uL (ref 4.0–10.5)
nRBC: 0 % (ref 0.0–0.2)

## 2019-06-03 LAB — PROTIME-INR
INR: 0.9 (ref 0.8–1.2)
Prothrombin Time: 11.5 seconds (ref 11.4–15.2)

## 2019-06-03 MED ORDER — LIDOCAINE-EPINEPHRINE (PF) 2 %-1:200000 IJ SOLN
INTRAMUSCULAR | Status: AC
  Filled 2019-06-03: qty 20

## 2019-06-03 MED ORDER — CEFAZOLIN SODIUM-DEXTROSE 2-4 GM/100ML-% IV SOLN
INTRAVENOUS | Status: AC
  Administered 2019-06-03: 2 g via INTRAVENOUS
  Filled 2019-06-03: qty 100

## 2019-06-03 MED ORDER — SODIUM CHLORIDE 0.9 % IV SOLN: INTRAVENOUS | Status: DC

## 2019-06-03 MED ORDER — CEFAZOLIN SODIUM-DEXTROSE 2-4 GM/100ML-% IV SOLN: 2.0000 g | INTRAVENOUS | Status: AC

## 2019-06-03 NOTE — Discharge Instructions (Signed)
Implanted Port Removal, Care After This sheet gives you information about how to care for yourself after your procedure. Your health care provider may also give you more specific instructions. If you have problems or questions, contact your health care provider. What can I expect after the procedure? After the procedure, it is common to have:  Soreness or pain near your incision.  Some swelling or bruising near your incision. Follow these instructions at home: Medicines  Take over-the-counter and prescription medicines only as told by your health care provider.  If you were prescribed an antibiotic medicine, take it as told by your health care provider. Do not stop taking the antibiotic even if you start to feel better. Bathing  Do not take baths, swim, or use a hot tub until your health care provider approves. Ask your health care provider if you can take showers. You may only be allowed to take sponge baths. Incision care   Follow instructions from your health care provider about how to take care of your incision. Make sure you: ? Wash your hands with soap and water before you change your bandage (dressing). If soap and water are not available, use hand sanitizer. ? Change your dressing as told by your health care provider. ? Keep your dressing dry. ? Leave stitches (sutures), skin glue, or adhesive strips in place. These skin closures may need to stay in place for 2 weeks or longer. If adhesive strip edges start to loosen and curl up, you may trim the loose edges. Do not remove adhesive strips completely unless your health care provider tells you to do that.  Check your incision area every day for signs of infection. Check for: ? More redness, swelling, or pain. ? More fluid or blood. ? Warmth. ? Pus or a bad smell. Driving   Do not drive for 24 hours if you were given a medicine to help you relax (sedative) during your procedure.  If you did not receive a sedative, ask your  health care provider when it is safe to drive. Activity  Return to your normal activities as told by your health care provider. Ask your health care provider what activities are safe for you.  Do not lift anything that is heavier than 10 lb (4.5 kg), or the limit that you are told, until your health care provider says that it is safe.  Do not do activities that involve lifting your arms over your head. General instructions  Do not use any products that contain nicotine or tobacco, such as cigarettes and e-cigarettes. These can delay healing. If you need help quitting, ask your health care provider.  Keep all follow-up visits as told by your health care provider. This is important. Contact a health care provider if:  You have more redness, swelling, or pain around your incision.  You have more fluid or blood coming from your incision.  Your incision feels warm to the touch.  You have pus or a bad smell coming from your incision.  You have pain that is not relieved by your pain medicine. Get help right away if you have:  A fever or chills.  Chest pain.  Difficulty breathing. Summary  After the procedure, it is common to have pain, soreness, swelling, or bruising near your incision.  If you were prescribed an antibiotic medicine, take it as told by your health care provider. Do not stop taking the antibiotic even if you start to feel better.  Do not drive for 24 hours   if you were given a sedative during your procedure.  Return to your normal activities as told by your health care provider. Ask your health care provider what activities are safe for you. This information is not intended to replace advice given to you by your health care provider. Make sure you discuss any questions you have with your health care provider. Document Revised: 06/22/2017 Document Reviewed: 06/22/2017 Elsevier Patient Education  2020 [employer]  

## 2019-06-03 NOTE — Procedures (Signed)
Interventional Radiology Procedure Note  Procedure: Portacatheter removal  Complications: None  Estimated Blood Loss: None  Recommendations: - DC home   Signed,  Jolie Strohecker K. Tyreke Kaeser, MD   

## 2019-06-03 NOTE — H&P (Signed)
Chief Complaint: Patient was seen in consultation today for port removal.  Referring Physician(s): [name] Lark  Supervising Physician: Jacqulynn Cadet  Patient Status: [facility] - Out-pt  History of Present Illness: Stacy Rivas is a 64 y.o. female with a past medical history significant for anxiety, diverticulosis, IBS and ovarian cancer followed by Dr. Alvy Bimler who presents today for port removal. She was previously seen in IR on 12/06/18 for port placement, she has since completed chemotherapy and would like to have her port removed.   Ms. Stacy Rivas reports she is a little anxious today but otherwise has been feeling well. She completed chemotherapy on 04/22/19 and had 1 blood draw using her port on 05/15/19. She has had no issues with the port and is excited to have it removed. She has requested no moderate sedation today which I have confirmed is still her wish. She states understanding of the requested procedure and wishes to proceed.  Past Medical History:  Diagnosis Date  . Anxiety state, unspecified   . Diverticulosis   . Esophageal reflux   . Family history of breast cancer   . Family history of lymphoma   . Family history of stomach cancer   . Family history of uterine cancer   . Fibroid tumor   . Headache    due to allergies   . Hiatal hernia   . Hyperlipidemia   . IBS (irritable bowel syndrome)   . Internal hemorrhoids   . Nonspecific elevation of levels of transaminase or lactic acid dehydrogenase (LDH)   . ovarian ca dx'd 11/2018  . Renal stone 07/2013  . Tubulovillous adenoma of colon   . Varicose veins    superficail thrombophlebitis (left LE)    Past Surgical History:  Procedure Laterality Date  . CATARACT EXTRACTION, BILATERAL    . cystoscopy with instillation for cystitis  2001  . DEBULKING N/A 02/12/2019   Procedure: DEBULKING OF TUMOR;  Surgeon: Everitt Amber, MD;  Location: [facility];  Service: Gynecology;  Laterality: N/A;  . DILATION AND CURETTAGE OF  UTERUS     miscarriage  . HYSTERECTOMY ABDOMINAL WITH SALPINGO-OOPHORECTOMY Bilateral 02/12/2019   Procedure: HYSTERECTOMY ABDOMINAL WITH SALPINGO-OOPHORECTOMY;  Surgeon: Everitt Amber, MD;  [facility]: [facility];  Service: Gynecology;  Laterality: Bilateral;  . IR IMAGING GUIDED PORT INSERTION  12/06/2018  . IR PARACENTESIS  12/03/2018  . IR PARACENTESIS  12/14/2018  . LAPAROSCOPY     adhesions  . LITHOTRIPSY  07/2013   x 2  . MYOMECTOMY  1992  . OMENTECTOMY N/A 02/12/2019   Procedure: OMENTECTOMY;  Surgeon: Everitt Amber, MD;  Location: [facility];  Service: Gynecology;  Laterality: N/A;  . PARACENTESIS  11/21/2018   abdominal , removed 3.1 Liters     Allergies: Shrimp [shellfish allergy] and Sulfonamide derivatives  Medications: Prior to Admission medications   Medication Sig Start Date End Date Taking? Authorizing Provider  acetaminophen (TYLENOL) 500 MG tablet Take 500 mg by mouth every 6 (six) hours as needed for mild pain.    [provider]  ALPRAZolam Duanne Moron) 0.25 MG tablet Take 1 tablet (0.25 mg total) by mouth at bedtime as needed for anxiety. Patient taking differently: Take 0.125 mg by mouth at bedtime as needed for anxiety.  11/13/18   Nancy Marus, MD  carboxymethylcellulose (REFRESH PLUS) 0.5 % SOLN Place 1 drop into both eyes 2 (two) times daily as needed (dry eyes).    [provider]  Cholecalciferol (VITAMIN D3) 25 MCG (1000 UT) CAPS Take 1,000 Units  by mouth daily.     [provider]  diphenhydrAMINE (BENADRYL) 25 mg capsule Take 25 mg by mouth at bedtime as needed for sleep.    [provider]  esomeprazole (NEXIUM) 20 MG capsule Take 20 mg by mouth daily.     [provider]  loratadine (CLARITIN) 10 MG tablet Take 10 mg by mouth daily as needed for allergies.    [provider]  Multiple Vitamin (MULTIVITAMIN WITH MINERALS) TABS tablet Take 1 tablet by mouth daily.    [provider]  senna-docusate (SENOKOT-S)  8.6-50 MG tablet Take 2 tablets by mouth at bedtime. For AFTER surgery, do not take if having diarrhea Patient not taking: Reported on 05/27/2019 11/22/18   Joylene John D, NP     Family History  Problem Relation Age of Onset  . Lymphoma Father        dx 58s  . Heart disease Father   . Hypertension Father   . Hypertension Mother   . Hyperlipidemia Mother   . Breast cancer Mother 75  . Diabetes Maternal Grandmother   . Uterine cancer Maternal Grandmother        dx 97s  . Stomach cancer Paternal Grandfather   . Breast cancer Other        grandmother's sisters, both dx 52s  . Colon cancer Neg Hx     Social History   Socioeconomic History  . Marital status: Married    Spouse name: Not on file  . Number of children: 0  . Years of education: Not on file  . Highest education level: Not on file  Occupational History  . Occupation: Press photographer  Tobacco Use  . Smoking status: Former Smoker    Years: 10.00    Types: Cigarettes    Quit date: 05/23/1980    Years since quitting: 39.0  . Smokeless tobacco: Never Used  . Tobacco comment: quit 1984  Substance and Sexual Activity  . Alcohol use: Yes    Alcohol/week: 0.0 standard drinks    Comment: 2/day, 11-21-2018   no alcodholint he last month   . Drug use: No  . Sexual activity: Yes    Partners: Male  Other Topics Concern  . Not on file  Social History Narrative  . Not on file   Social Determinants of Health   Financial Resource Strain:   . Difficulty of Paying Living Expenses: Not on file  Food Insecurity:   . Worried About Charity fundraiser in the Last Year: Not on file  . Ran Out of Food in the Last Year: Not on file  Transportation Needs:   . Lack of Transportation (Medical): Not on file  . Lack of Transportation (Non-Medical): Not on file  Physical Activity:   . Days of Exercise per Week: Not on file  . Minutes of Exercise per Session: Not on file  Stress:   . Feeling of Stress : Not on file  Social Connections:   .  Frequency of Communication with Friends and Family: Not on file  . Frequency of Social Gatherings with Friends and Family: Not on file  . Attends Religious Services: Not on file  . Active Member of Clubs or Organizations: Not on file  . Attends Archivist Meetings: Not on file  . Marital Status: Not on file     Review of Systems: A 12 point ROS discussed and pertinent positives are indicated in the HPI above.  All other systems are negative.  Review  of Systems  Constitutional: Negative for chills and fever.  Respiratory: Negative for cough and shortness of breath.   Cardiovascular: Negative for chest pain.  Gastrointestinal: Negative for abdominal pain, diarrhea, nausea and vomiting.  Musculoskeletal: Negative for back pain.  Skin: Negative for rash.  Neurological: Negative for dizziness and headaches.    Vital Signs: There were no vitals taken for this visit.  Physical Exam Vitals reviewed.  Constitutional:      General: She is not in acute distress. HENT:     Head: Normocephalic.  Cardiovascular:     Rate and Rhythm: Normal rate and regular rhythm.     Comments: (+) right IJ port Pulmonary:     Effort: Pulmonary effort is normal.     Breath sounds: Normal breath sounds.  Abdominal:     General: There is no distension.     Palpations: Abdomen is soft.     Tenderness: There is no abdominal tenderness.  Skin:    General: Skin is warm and dry.  Neurological:     Mental Status: She is alert and oriented to person, place, and time.  Psychiatric:        Mood and Affect: Mood normal.        Behavior: Behavior normal.        Thought Content: Thought content normal.        Judgment: Judgment normal.      MD Evaluation Airway: WNL Heart: WNL Abdomen: WNL Chest/ Lungs: WNL ASA  Classification: 2 Mallampati/Airway Score: One   Imaging: CT Abdomen Pelvis W Contrast  Result Date: 05/22/2019 CLINICAL DATA:  Ovarian carcinoma. Chemotherapy complete.  Surgical hysterectomy, oophorectomy and omentectomy radical tumor debulking 02/12/2019. EXAM: CT ABDOMEN AND PELVIS WITH CONTRAST TECHNIQUE: Multidetector CT imaging of the abdomen and pelvis was performed using the standard protocol following bolus administration of intravenous contrast. CONTRAST:  18mL OMNIPAQUE IOHEXOL 300 MG/ML  SOLN COMPARISON:  CT 02/01/2019 FINDINGS: Lower chest: Lung bases are clear. Hepatobiliary: No focal hepatic lesion. No biliary duct dilatation. Gallbladder is normal. Common bile duct is normal. Pancreas: Pancreas is normal. No ductal dilatation. No pancreatic inflammation. Spleen: Normal spleen Adrenals/urinary tract: Adrenal glands normal. No hydronephrosis. Nonobstructing LEFT renal calculi. Ureters and bladder normal. Stomach/Bowel: Stomach, small bowel, appendix, and cecum are normal. The colon and rectosigmoid colon are normal. Vascular/Lymphatic: Abdominal aorta normal caliber. No retroperitoneal periportal adenopathy. No pelvic lymphadenopathy Reproductive: Post hysterectomy and oophorectomy. Interval resection of large LEFT ovarian mass. No residual tumor evident in the pelvis. Other: Interval omentectomy. No nodularity of the peritoneal surface. No free fluid in the abdomen or pelvis. Postsurgical change at the abdominal wall consistent with laparoscopy. Musculoskeletal: No aggressive osseous lesion. IMPRESSION: 1. Interval hysterectomy, oophorectomy and omentectomy. No evidence of residual ovarian carcinoma in the pelvis. No evidence of peritoneal disease. No intraperitoneal free fluid. 2. Nonobstructing LEFT renal calculi.  Normal ureters. Electronically Signed   By: Suzy Bouchard M.D.   On: 05/22/2019 11:58    Labs:  CBC: Recent Labs    03/07/19 1217 04/01/19 0815 04/22/19 0915 05/22/19 0828  WBC 5.5 5.2 6.7 4.6  HGB 11.3* 12.6 12.7 11.8*  HCT 34.7* 37.1 37.2 35.3*  PLT 278 224 201 216    COAGS: Recent Labs    12/06/18 0855  INR 1.0     BMP: Recent Labs    03/07/19 1217 04/01/19 0815 04/22/19 0915 05/22/19 0828  NA 142 142 142 143  K 3.9 3.7 3.9 3.8  CL 105 107 107  107  CO2 28 22 23 26   GLUCOSE 92 243* 137* 95  BUN 12 12 13 11   CALCIUM 9.4 9.4 9.6 9.1  CREATININE 0.60 0.71 0.64 0.60  GFRNONAA >60 >60 >60 >60  GFRAA >60 >60 >60 >60    LIVER FUNCTION TESTS: Recent Labs    03/07/19 1217 04/01/19 0815 04/22/19 0915 05/22/19 0828  BILITOT 0.9 0.6 0.7 1.3*  AST 27 40 38 46*  ALT 32 60* 57* 60*  ALKPHOS 162* 197* 151* 162*  PROT 6.7 7.0 7.0 6.5  ALBUMIN 4.0 4.1 4.1 4.0    TUMOR MARKERS: No results for input(s): AFPTM, CEA, CA199, CHROMGRNA in the last 8760 hours.  Assessment and Plan:  64 y/o F with history of ovarian cancer followed by Dr. Alvy Bimler who has completed chemotherapy and presents today for port removal.  Patient has been NPO since midnight except for about 3 ounces of water with her medicine earlier this morning - she requests not moderate sedation (lidocaine + IV fentanyl only) however if she cannot tolerate this she is agreeable to be given versed, she does not take blood thinning medications. Afebrile, WBC 4.4, hgb 13.0, plt 252, INR 0.9.  Risks and benefits of image-guided Port-a-catheter removal were discussed with the patient including, but not limited to bleeding, infection, pneumothorax and need for additional procedures.   All of the patient's questions were answered, patient is agreeable to proceed.  Consent signed and in chart.  Thank you for this interesting consult.  I greatly enjoyed meeting YENTL RUDESILL and look forward to participating in their care.  A copy of this report was sent to the requesting provider on this date.  Electronically Signed: Joaquim Nam, PA-C 06/03/2019, 8:13 AM   I spent a total of 15 Minutes in face to face in clinical consultation, greater than 50% of which was counseling/coordinating care for port removal.

## 2019-06-04 ENCOUNTER — Other Ambulatory Visit: Payer: Self-pay | Admitting: Hematology and Oncology

## 2019-06-07 ENCOUNTER — Ambulatory Visit: Payer: Managed Care, Other (non HMO) | Attending: Hematology and Oncology | Admitting: Physical Therapy

## 2019-06-07 ENCOUNTER — Other Ambulatory Visit: Payer: Self-pay

## 2019-06-07 ENCOUNTER — Encounter: Payer: Self-pay | Admitting: Physical Therapy

## 2019-06-07 DIAGNOSIS — G620 Drug-induced polyneuropathy: Secondary | ICD-10-CM | POA: Insufficient documentation

## 2019-06-07 DIAGNOSIS — T451X5A Adverse effect of antineoplastic and immunosuppressive drugs, initial encounter: Secondary | ICD-10-CM | POA: Diagnosis present

## 2019-06-07 DIAGNOSIS — C562 Malignant neoplasm of left ovary: Secondary | ICD-10-CM | POA: Insufficient documentation

## 2019-06-07 NOTE — Patient Instructions (Addendum)
Moisturizers . They are used in the vagina to hydrate the mucous membrane that make up the vaginal canal. . Designed to keep a more normal acid balance (ph) . Once placed in the vagina, it will last between two to three days.  . Use 2-3 times per week at bedtime  . Ingredients to avoid is glycerin and fragrance, can increase chance of infection . Should not be used just before sex due to causing irritation . Most are gels administered either in a tampon-shaped applicator or as a vaginal suppository. They are non-hormonal.   Types of Moisturizers  . Vitamin E vaginal suppositories- Whole foods, Amazon . Moist Again . Coconut oil- can break down condoms . Julva- (Do no use if on Tamoxifen) amazon . Yes moisturizer- amazon . NeuEve Silk , NeuEve Silver for menopausal or over 65 (if have severe vaginal atrophy or cancer treatments use NeuEve Silk for  1 month than move to The Pepsi)- [employer], MapleFlower.dk . Olive and Bee intimate cream- www.oliveandbee.com.au . Mae vaginal Herscher . Aloe   Creams to use externally on the Vulva area  Albertson's (good for for cancer patients that had radiation to the area)- [place] or Dana[employer] Corporation.FlyingBasics.com.br  V-magic cream - amazon  Julva-amazon  Vital "V Wild Yam salve ( help moisturize and help with thinning vulvar area, does have [place] by Damiva labial moisturizer (Amazon,   Coconut or olive oil  aloe   Lubrication . Used for intercourse to reduce friction . Avoid ones that have glycerin, warming gels, tingling gels, icing or cooling gel, scented . May need to be reapplied once or several times during sexual activity . Can be applied to both partners genitals prior to vaginal penetration to minimize friction or irritation . Prevent irritation and mucosal tears that cause post coital pain and increased the risk of vaginal and urinary  tract infections . Oil-based lubricants cannot be used with condoms due to breaking them down.  Least likely to irritate vaginal tissue.  . Plant based-lubes are safe . Silicone-based lubrication are thicker and last long and used for post-menopausal women Types of Lubricants . Good Clean Love (water based)-Rite Aide, Target, Walmart, CVS . Slippery Stuff(water based) [employer] . Sylk (water based) [employer], Climax- drug store; www.blossom-organics.com . Samul Dada- Drug store . Coconut oil- will breakdown condoms, least irritating . Aloe Vera- least irritating . Sliquid Natural H20 (water based)-Walgreen's, good if frequent UTI's . Wet Platinum- (Silicone) Target, Walgreen's . Yes [place] . [place] Jelly, Replens, and Astroglide kills good Bacteria (lactobadilli)  Things to avoid in the vaginal area . Do not use things to irritate the vulvar area . No lotions . No soaps; can use Aveeno or Calendula cleanser if needed. Must be gentle . No deodorants . No douches . Good to sleep without underwear to let the vaginal area to air out . No scrubbing: spread the lips to let warm water rinse over labias and pat dry  [facility] [address], [place], [place] [address] Phone # [phone] Fax [phone] Access Code: [email or web address]  URL: [email or web address]  Date: 06/11/2019  Prepared by: Jari Favre   Exercises Seated Hamstring Stretch - 3 reps - 1 sets - 30 sec hold - 1x daily - 7x weekly Seated Piriformis Stretch with Trunk Bend - 3 reps - 1 sets - 30 sec hold - 1x daily - 7x weekly

## 2019-06-09 NOTE — Therapy (Addendum)
[facility] [address] [place], [place], [address] Phone: [phone]   Fax:  [phone]  Physical Therapy Treatment  Patient Details  Name: Stacy Rivas MRN: [id number] Date of Birth: 1955-09-03 Referring Provider (PT): [name] Lark    Encounter Date: 06/07/2019  PT End of Session - 06/09/19 0786    Visit Number  2    Number of Visits  9    Date for PT Re-Evaluation  07/01/19    PT Start Time  1102    PT Stop Time  1148    PT Time Calculation (min)  46 min    Activity Tolerance  Patient tolerated treatment well    Behavior During Therapy  [facility] for tasks assessed/performed       Past Medical History:  Diagnosis Date  . Anxiety state, unspecified   . Diverticulosis   . Esophageal reflux   . Family history of breast cancer   . Family history of lymphoma   . Family history of stomach cancer   . Family history of uterine cancer   . Fibroid tumor   . Headache    due to allergies   . Hiatal hernia   . Hyperlipidemia   . IBS (irritable bowel syndrome)   . Internal hemorrhoids   . Nonspecific elevation of levels of transaminase or lactic acid dehydrogenase (LDH)   . ovarian ca dx'd 11/2018  . Renal stone 07/2013  . Tubulovillous adenoma of colon   . Varicose veins    superficail thrombophlebitis (left LE)    Past Surgical History:  Procedure Laterality Date  . CATARACT EXTRACTION, BILATERAL    . cystoscopy with instillation for cystitis  2001  . DEBULKING N/A 02/12/2019   Procedure: DEBULKING OF TUMOR;  Surgeon: Everitt Amber, MD;  [facility];  Service: Gynecology;  Laterality: N/A;  . DILATION AND CURETTAGE OF UTERUS     miscarriage  . HYSTERECTOMY ABDOMINAL WITH SALPINGO-OOPHORECTOMY Bilateral 02/12/2019   Procedure: HYSTERECTOMY ABDOMINAL WITH SALPINGO-OOPHORECTOMY;  Surgeon: Everitt Amber, MD;  [facility];  Service: Gynecology;  Laterality: Bilateral;  . IR IMAGING GUIDED PORT INSERTION   12/06/2018  . IR PARACENTESIS  12/03/2018  . IR PARACENTESIS  12/14/2018  . IR REMOVAL TUN ACCESS W/ PORT W/O FL MOD SED  06/03/2019  . LAPAROSCOPY     adhesions  . LITHOTRIPSY  07/2013   x 2  . MYOMECTOMY  1992  . OMENTECTOMY N/A 02/12/2019   Procedure: OMENTECTOMY;  Surgeon: Everitt Amber, MD;  [facility];  Service: Gynecology;  Laterality: N/A;  . PARACENTESIS  11/21/2018   abdominal , removed 3.1 Liters     There were no vitals filed for this visit.  Subjective Assessment - 06/11/19 1801    Subjective  Pt would like to work on improving her pelvic symptoms. I also have not done any exercises since the surgery.    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 Hx of shings in April of 2020    Patient Stated Goals  I want to get stronger    Currently in Pain?  Yes    Pain Score  4     Pain Location  Abdomen    Pain Orientation  Lower;Right    Pain Type  Surgical pain    Pain Frequency  Intermittent    Multiple Pain Sites  No         [facility] PT Assessment - 06/11/19  0001      Assessment   Medical Diagnosis  Abdominal Hysterectomy with oopherectomy     Referring Provider (PT)  Ni Gorsuch       Precautions   Precautions  None                Pelvic Floor Special Questions - 06/11/19 0001    Prior Pelvic/Prostate Exam  Yes    Prior Pregnancies  Yes    Number of Pregnancies  2    Currently Sexually Active  No    Marinoff Scale  pain prevents any attempts at intercourse    Urinary Leakage  No    Urinary frequency  difficulty urinating    Fecal incontinence  No    Falling out feeling (prolapse)  Yes   maybe (feel things moving when I sit)   Skin Integrity  --   dryness   External Palpation  normal    Prolapse  None    Pelvic Floor Internal Exam  pt identity confirmed and informed consent given to perform internal soft tissue assess and treat    Exam Type  Vaginal    Palpation   high tone and TTP Rt>Lt    Strength  fair squeeze, definite lift    Strength # of seconds  2    Tone  high        [facility] Adult PT Treatment/Exercise - 06/11/19 0001      Self-Care   Self-Care  Other Self-Care Comments    Other Self-Care Comments   showed samples and how to use lubricants and moisturizers demo for self perineal stretching on model and explained      Exercises   Other Exercises   reviewed and performed intial HEP Access Code: [email or web address]             PT Education - 06/11/19 1807    Education Details  Access Code: [email or web address] and moisturizers/lubricants - gave sample    Person(s) Educated  Patient    Methods  Explanation;Demonstration;Verbal cues;Handout    Comprehension  Verbalized understanding;Returned demonstration       PT Short Term Goals - 05/27/19 1529      PT SHORT TERM GOAL #1   Title  Pt will be independent with her inital HEP    Baseline  pt did not have an HEP    Time  1    Period  Weeks    Status  New    Target Date  06/10/19        PT Long Term Goals - 05/27/19 1529      PT LONG TERM GOAL #1   Title  Pt will demonstrate improve transverse abdominus activation to 10 seconds with out tactile cueing 5x while in sitting in order to demonstrate improve strength.    Baseline  pt is able to hold transverse abdominus with heavy tactile/VC for 5 seconds or less.    Time  4    Period  Weeks    Status  New    Target Date  07/01/19      PT LONG TERM GOAL #2   Title  Pt will report 25% improvement in pain in her lower R abdominal quadrant in order to improve quality of life and decrease risk for immobility within 4 weeks.    Baseline  pt reports occasional shooting pain in her abdomen.    Time  4    Period  Weeks    Status  New    Target   Date  07/01/19            Plan - 06/11/19 1411    Clinical Impression Statement  Pt presents to clinic for follow up visit.  She has come to [place] clinic for her treatment to further address pelvic  floor issues as detailed above.  Pt was given initial HEP and education for moisturizers and lubricants.  Pt has elevated tone and some TTP Rt>Lt in pelvic floor muscle tissue including the levators, obdurator internus, and ischial cavernosis muscles.  Pt will benefit from skilled PT to address these impairments so she can return to normal functional activities.    PT Treatment/Interventions  ADLs/Self Care Home Management;Biofeedback;Electrical Stimulation;Iontophoresis 57m/ml Dexamethasone;Neuromuscular re-education;Patient/family education;Manual lymph drainage;Therapeutic exercise;Therapeutic activities    PT Next Visit Plan  f/u on moisturizers and initial HEP; STM and add contract, relax and bulge to HEP; breathing techniques    PT Home Exercise Plan  Access Code: [email or web address]   Consulted and Agree with Plan of Care  Patient       Patient will benefit from skilled therapeutic intervention in order to improve the following deficits and impairments:     Visit Diagnosis: Left ovarian epithelial cancer ([facility]  Peripheral neuropathy due to chemotherapy ([facility]     Problem List Patient Active Problem List   Diagnosis Date Noted  . Genetic testing 04/25/2019  . Hyperglycemia, drug-induced 04/01/2019  . Elevated liver enzymes 04/01/2019  . Family history of breast cancer   . Family history of uterine cancer   . Family history of lymphoma   . Family history of stomach cancer   . Preventive measure 03/05/2019  . Peripheral neuropathy due to chemotherapy ([facility] 01/17/2019  . Anemia due to antineoplastic chemotherapy 01/17/2019  . Arthralgia 12/28/2018  . Peritoneal carcinomatosis ([facility] 11/30/2018  . Goals of care, counseling/discussion 11/30/2018  . Left ovarian epithelial cancer ([facility] 11/29/2018  . FLATULENCE-GAS-BLOATING 05/29/2008  . ABDOMINAL PAIN RIGHT UPPER QUADRANT 05/29/2008  . ABNORMAL TRANSAMINASE-LFT'S 05/29/2008  . ANXIETY 05/28/2008  . GERD 05/28/2008  . HIATAL HERNIA  05/28/2008  . IBS 05/28/2008    JJule Ser PT 06/11/2019, 6:14 PM  [facility] [address] [place] [address]Phone: [phone]  Fax:  [phone] Name: Stacy TAHIRMRN: [id number] of Birth: [birth year] PHYSICAL THERAPY DISCHARGE SUMMARY  Visits from Start of Care: 2  Current functional level related to goals / functional outcomes: See above goals   Remaining deficits: See above only came to eval   Education / Equipment: HEP  Plan: Patient agrees to discharge.  Patient goals were not met. Patient is being discharged due to not returning since the last visit.  ?????    [employer] PT 07/20/19 8:01 PM

## 2019-06-24 ENCOUNTER — Telehealth: Payer: Self-pay | Admitting: Oncology

## 2019-06-24 NOTE — Telephone Encounter (Signed)
Stacy Rivas called and is having some itching and a rash around her port removal site. She had the same type of itching when she had the port inserted and thinks she is allergic to the glue.    She said the site is red but not open or draining and she doesn't think she needs to be seen.  She is just making sure if it is safe to put hydrocortisone or benadryl cream around it.   She is also wondering if it is ok to get the Covid vaccine since she finished treatment on 04/22/19.

## 2019-06-25 ENCOUNTER — Encounter: Payer: Self-pay | Admitting: Oncology

## 2019-06-25 ENCOUNTER — Telehealth: Payer: Self-pay | Admitting: Oncology

## 2019-06-25 ENCOUNTER — Other Ambulatory Visit: Payer: Self-pay | Admitting: Hematology and Oncology

## 2019-06-25 ENCOUNTER — Encounter: Payer: Self-pay | Admitting: Hematology and Oncology

## 2019-06-25 DIAGNOSIS — L089 Local infection of the skin and subcutaneous tissue, unspecified: Secondary | ICD-10-CM | POA: Insufficient documentation

## 2019-06-25 MED ORDER — CEPHALEXIN 500 MG PO CAPS: 500.0000 mg | ORAL_CAPSULE | Freq: Two times a day (BID) | ORAL | 0 refills | Status: DC

## 2019-06-25 MED FILL — CEPHALEXIN 500 MG CAPSULE: 500 | 7 days supply | Qty: 14 | Fill #0

## 2019-06-25 NOTE — Telephone Encounter (Signed)
[facility] and advised her that Dr. Alvy Bimler has reviewed her pictures and has sent a prescription for Keflex to the [facility] to take for 7 days.  She verbalized agreement.

## 2019-06-25 NOTE — Telephone Encounter (Signed)
[facility] and advised her of message from Dr. Alvy Bimler.  She verbalized agreement of instructions and said the site is still irritated today.  She is going to send a picture through [facility].

## 2019-06-25 NOTE — Telephone Encounter (Signed)
I suggest  1) Take a picture in case it's worse so we can compare 2) OK to ice, tylenol and benadryl 3) OTC hydrocortisone cream is OK for short term use less than 7 days. If not better, she needs to be seen 4) OK to proceed with the vaccine

## 2019-07-02 ENCOUNTER — Ambulatory Visit: Payer: Managed Care, Other (non HMO)

## 2019-07-02 ENCOUNTER — Encounter: Payer: Self-pay | Admitting: Hematology and Oncology

## 2019-07-02 ENCOUNTER — Inpatient Hospital Stay: Payer: Managed Care, Other (non HMO) | Attending: Gynecologic Oncology | Admitting: Hematology and Oncology

## 2019-07-02 ENCOUNTER — Other Ambulatory Visit: Payer: Self-pay

## 2019-07-02 ENCOUNTER — Telehealth: Payer: Self-pay | Admitting: Oncology

## 2019-07-02 DIAGNOSIS — L923 Foreign body granuloma of the skin and subcutaneous tissue: Secondary | ICD-10-CM | POA: Insufficient documentation

## 2019-07-02 DIAGNOSIS — C562 Malignant neoplasm of left ovary: Secondary | ICD-10-CM | POA: Diagnosis present

## 2019-07-02 MED ORDER — PREDNISONE 20 MG PO TABS: 20.0000 mg | ORAL_TABLET | Freq: Every day | ORAL | 0 refills | Status: DC

## 2019-07-02 MED FILL — predniSONE 20 MG TABS: 20 | 7 days supply | Qty: 7 | Fill #0

## 2019-07-02 NOTE — Assessment & Plan Note (Signed)
It appears she might have reaction to suture or glue applied to her port site after recent removal Her examination today is consistent with allergic akin reaction I suggest 7 days course of prednisone, benadryl and claritin Side-effects of each medication is are discussed and she agreed to proceed I took picture of her skin with permission

## 2019-07-02 NOTE — Telephone Encounter (Signed)
Stacy Rivas called and said the rash over her right port removal site is spreading.  She said it is up to her neck and down to her breast.  It is red and continues to itch and it keeping her up at night.  She is taking her last Kelfex tablet today.  She is wondering if she needs to be seen and is also going to send a picture.

## 2019-07-02 NOTE — Telephone Encounter (Signed)
Sure I will see her today but she would have to wait until end of today at 3 pm

## 2019-07-02 NOTE — Telephone Encounter (Signed)
Called Sandy back and scheduled appointment at 3:00 today.

## 2019-07-02 NOTE — Progress Notes (Signed)
[facility] OFFICE PROGRESS NOTE  Patient Care Team: Burnard Bunting, MD as PCP - General (Internal Medicine) Awanda Mink Craige Cotta, RN as Oncology Nurse Navigator (Oncology)  ASSESSMENT & PLAN:  Foreign body reaction of the skin It appears she might have reaction to suture or glue applied to her port site after recent removal Her examination today is consistent with allergic akin reaction I suggest 7 days course of prednisone, benadryl and claritin Side-effects of each medication is are discussed and she agreed to proceed I took picture of her skin with permission   No orders of the defined types were placed in this encounter.   All questions were answered. The patient knows to call the clinic with any problems, questions or concerns. The total time spent in the appointment was 15 minutes encounter with patients including review of chart and various tests results, discussions about plan of care and coordination of care plan   [name] Lark, MD 07/02/2019 3:05 PM  INTERVAL HISTORY: Please see below for problem oriented charting. 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 14.7 cm poorly defined soft tissue mass in central pelvis suspicious for primary ovarian carcinoma, with uterine leiomyosarcoma considered a less likely differential diagnosis. 2. Moderate ascites and diffuse peritoneal carcinomatosis. 3. Several small uterine fibroids.   11/13/2018 Tumor Marker   Patient's tumor was tested for the following markers: CA-125 Results of the tumor marker test revealed 1434   11/21/2018 Procedure   Successful ultrasound-guided diagnostic and therapeutic paracentesis  yielding 3.1 liters of peritoneal fluid   11/21/2018 Pathology Results   PERITONEAL/ASCITIC FLUID(SPECIMEN 1 OF 1 COLLECTED 11/21/18): ADENOCARCINOMA. Specimen Clinical Information Pelvic mass suspicious for ovarian cancer Source Peritoneal/Ascitic Fluid, (specimen 1 of 1 collected 11/21/18) Gross Specimen: Received is/are 1000 ccs of dark amber fluid. (CM:cm) Prepared: # Smears: 0 # Concentration Technique Slides (i.e. 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   11/29/2018 Initial Diagnosis   Ovarian cancer ([place])   11/29/2018 Cancer Staging   Staging form: Ovary, Fallopian Tube, and Primary Peritoneal Carcinoma, AJCC 8th Edition - Clinical: cT3, cN0, cM0 - Signed by [name] Lark, MD on 11/29/2018   12/03/2018 Procedure   Successful ultrasound-guided therapeutic paracentesis yielding 3.7 liters of peritoneal fluid.     12/06/2018 Procedure   Placement of a subcutaneous port device. Catheter tip at the SVC and right atrium junction   12/14/2018 Procedure   Successful ultrasound-guided paracentesis yielding 2.9 L of peritoneal fluid   12/28/2018 Tumor Marker   Patient's tumor was tested for the following markers: CA-125 Results of the tumor marker test revealed 812.   12/28/2018 -  Chemotherapy   The patient had carboplatin and taxol for chemotherapy treatment.     02/01/2019 Imaging   Ct abdomen and pelvis 8.6 cm left ovarian mass, corresponding to the patient's known primary neoplasm, improved.   Mild peritoneal nodularity/omental caking, improved.   Small abdominopelvic ascites, improved.     02/01/2019 Tumor Marker   Patient's tumor was tested for the following markers: CA-125 Results of the tumor marker test revealed 183.   02/12/2019 Surgery   Preoperative Diagnosis:  Stage IIIC ovarian cancer, s/p neoadjuvant chemotherapy      Procedure(s) Performed: 1. Exploratory laparotomy with  total abdominal hysterectomy, bilateral salpingo-oophorectomy, omentectomy radical tumor debulking for ovarian cancer.   Surgeon: Thereasa Solo, MD.    Operative Findings: upper abdomen free of disease. No visible omental disease. Small volume (200cc) ascites. 8cm friable mass replacing left ovary and adherent to the sigmoid colon mesentery and ureter on the left.  Anterior fibroid. This represented an optimal cytoreduction (R0) with no gross visible disease remaining.    02/12/2019 Pathology Results   A. OVARY AND FALLOPIAN TUBE, LEFT, SALPINGO-OOPHORECTOMY: - Serous carcinoma, high grade, status post neoadjuvant therapy - See oncology table and comment below B. UTERUS CERVIX WITH RIGHT FALLOPIAN TUBE AND OVARY, HYSTERECTOMY: Uterus: - Serosal surface involved by serous carcinoma - Endomyometrium uninvolved by carcinoma - Benign endometrial polyp (4.1 cm) - Leiomyomata (5.5 cm; largest) - Adenomyosis Cervix: - Uninvolved by carcinoma Left ovary: - Serous carcinoma, high grade Left fallopian tube: - Serous carcinoma, high grade C. SOFT TISSUE, LEFT PELVIC SIDEWALL TUMOR, EXCISION: - Metastatic serous carcinoma, high-grade D. SOFT TISSUE, SIGMOID COLON MESENTERY, EXCISION: - Metastatic serous carcinoma, high-grade E. OMENTUM, TUMOR RESECTION: - Metastatic serous carcinoma, high-grade OVARY or FALLOPIAN TUBE or PRIMARY PERITONEUM: Procedure: Total hysterectomy and bilateral salpingo-oophorectomy, omentectomy and peritoneal biopsies Specimen Integrity: Fragmented Tumor Site: Left ovary and fallopian tube Ovarian Surface Involvement: Present Fallopian Tube Surface Involvement: Present Tumor Size: 6.3 cm (see comment) Histologic Type: Serous carcinoma Histologic Grade: High-grade Implants: Not applicable Other Tissue/ Organ Involvement: Right ovary, right fallopian tube, omentum, mesentery Largest Extrapelvic Peritoneal Focus: Macroscopic Peritoneal/Ascitic Fluid:  Malignant Treatment Effect: No definite or minimal response identified (chemotherapy response score 1 [CRS1] Regional Lymph Nodes: No lymph nodes submitted or found Pathologic Stage Classification (pTNM, AJCC 8th Edition): pT3c, pNX Representative Tumor Block: A4 Comment(s): Additional testing (HER-2, MMR and MSI) are pending. The primary tumor site appears to be the left ovary and fallopian tube. The uterus is only involved on the serosal surface. There is tumor on the anterior peritoneal reflection.  Addendum: Tumor is Her2 negative,MSI stable   02/19/2019 Tumor Marker   Patient's tumor was tested for the following markers: CA-125 Results of the tumor marker test revealed 40.3   04/01/2019 Tumor Marker   Patient's tumor was tested for the following markers: CA-125 Results of the tumor marker test revealed 8.1    Genetic Testing   Negative testing. No pathogenic variants identified on the [employer]. The report date is 04/17/2019.  Somatic genes analyzed through [facility]: ATM, BARD1, BRCA1, BRCA2, BRIP1, CHEK2, MRE11A, NBN, PALB2, RAD51C, RAD51D.  The [facility]Next gene panel offered by [employer] includes sequencing and rearrangement analysis for the following 36 genes: APC*, ATM*, AXIN2, BARD1, BMPR1A, BRCA1*, BRCA2*, BRIP1*, CDH1*, CDK4, CDKN2A, CHEK2*, DICER1, MLH1*, MSH2*, MSH3, MSH6*, MUTYH*, NBN, NF1*, NTHL1, PALB2*, PMS2*, PTEN*, RAD51C*, RAD51D*, RECQL, SMAD4, SMARCA4, STK11 and TP53* (sequencing and deletion/duplication); HOXB13, POLD1 and POLE (sequencing only); EPCAM and GREM1 (deletion/duplication only).    05/21/2019 Imaging   1. Interval hysterectomy, oophorectomy and omentectomy. No evidence of residual ovarian carcinoma in the pelvis. No evidence of peritoneal disease. No intraperitoneal free fluid. 2. Nonobstructing LEFT renal calculi.  Normal ureters.   05/22/2019 Tumor Marker   Patient's tumor was tested for the following markers:  CA-125 Results of the tumor marker test revealed 5.7.   06/03/2019 Procedure   Successful right IJ vein Port-A-Cath explant.     REVIEW OF SYSTEMS:  Constitutional: Denies fevers, chills or abnormal weight loss Eyes: Denies blurriness of vision Ears, nose, mouth, throat, and face: Denies mucositis or sore throat Respiratory: Denies cough, dyspnea or wheezes Cardiovascular: Denies palpitation, chest discomfort or lower extremity swelling Gastrointestinal:  Denies nausea, heartburn or change in bowel habits Lymphatics: Denies new lymphadenopathy or easy bruising Neurological:Denies numbness, tingling or new weaknesses Behavioral/Psych: Mood is stable, no new changes  All other systems were reviewed with the patient and are negative.  I have reviewed the past medical history, past surgical history, social history and family history with the patient and they are unchanged from previous note.  ALLERGIES:  is allergic to skin adhesives [cyanoacrylate]; shrimp [shellfish allergy]; and sulfonamide derivatives.  MEDICATIONS:  Current Outpatient Medications  Medication Sig Dispense Refill  . acetaminophen (TYLENOL) 500 MG tablet Take 500 mg by mouth every 6 (six) hours as needed for mild pain.    [name] Kitchen ALPRAZolam (XANAX) 0.25 MG tablet Take 1 tablet (0.25 mg total) by mouth at bedtime as needed for anxiety. (Patient taking differently: Take 0.125 mg by mouth at bedtime as needed for anxiety. ) 15 tablet 0  . carboxymethylcellulose (REFRESH PLUS) 0.5 % SOLN Place 1 drop into both eyes 2 (two) times daily as needed (dry eyes).    . cephALEXin (KEFLEX) 500 MG capsule Take 1 capsule (500 mg total) by mouth 2 (two) times daily. 14 capsule 0  . Cholecalciferol (VITAMIN D3) 25 MCG (1000 UT) CAPS Take 1,000 Units by mouth daily.     . diphenhydrAMINE (BENADRYL) 25 mg capsule Take 25 mg by mouth at bedtime as needed for sleep.    [name] Kitchen esomeprazole (NEXIUM) 20 MG capsule Take 20 mg by mouth daily.     [name] Kitchen  loratadine (CLARITIN) 10 MG tablet Take 10 mg by mouth daily as needed for allergies.    . Multiple Vitamin (MULTIVITAMIN WITH MINERALS) TABS tablet Take 1 tablet by mouth daily.    . predniSONE (DELTASONE) 20 MG tablet Take 1 tablet (20 mg total) by mouth daily with breakfast. 7 tablet 0  . senna-docusate (SENOKOT-S) 8.6-50 MG tablet Take 2 tablets by mouth at bedtime. For AFTER surgery, do not take if having diarrhea (Patient not taking: Reported on 05/27/2019) 30 tablet 1   No current facility-administered medications for this visit.    PHYSICAL EXAMINATION: ECOG PERFORMANCE STATUS: 1 - Symptomatic but completely ambulatory  Vitals:   07/02/19 1450  BP: (!) 150/75  Pulse: 69  Resp: 18  Temp: 97.8 F (36.6 C)  SpO2: 100%   Filed Weights   07/02/19 1450  Weight: 141 lb (64 kg)    GENERAL:alert, no distress and comfortable SKIN:noted skin rash consistent with hives NEURO: alert & oriented x 3 with fluent speech, no focal motor/sensory deficits  LABORATORY DATA:  I have reviewed the data as listed    Component Value Date/Time   NA 143 05/22/2019 0828   K 3.8 05/22/2019 0828   CL 107 05/22/2019 0828   CO2 26 05/22/2019 0828   GLUCOSE 95 05/22/2019 0828   BUN 11 05/22/2019 0828   CREATININE 0.60 05/22/2019 0828   CALCIUM 9.1 05/22/2019 0828   PROT 6.5 05/22/2019 0828   ALBUMIN 4.0 05/22/2019 0828   AST 46 (H) 05/22/2019 0828   ALT 60 (H) 05/22/2019 0828   ALKPHOS 162 (H) 05/22/2019 0828   BILITOT 1.3 (H) 05/22/2019 0828   GFRNONAA >60 05/22/2019 0828   GFRAA >60 05/22/2019 0828    No results found for: SPEP,  UPEP  Lab Results  Component Value Date   WBC 4.4 06/03/2019   NEUTROABS 2.3 06/03/2019   HGB 13.0 06/03/2019   HCT 39.4 06/03/2019   MCV 99.5 06/03/2019   PLT 252 06/03/2019      Chemistry      Component Value Date/Time   NA 143 05/22/2019 0828   K 3.8 05/22/2019 0828   CL 107 05/22/2019 0828   CO2 26 05/22/2019 0828   BUN 11 05/22/2019 0828    CREATININE 0.60 05/22/2019 0828      Component Value Date/Time   CALCIUM 9.1 05/22/2019 0828   ALKPHOS 162 (H) 05/22/2019 0828   AST 46 (H) 05/22/2019 0828   ALT 60 (H) 05/22/2019 0828   BILITOT 1.3 (H) 05/22/2019 0828         RADIOGRAPHIC STUDIES: I have personally reviewed the radiological images as listed and agreed with the findings in the report. IR REMOVAL TUN ACCESS W/ PORT W/O FL MOD SED  Result Date: 06/03/2019 INDICATION: 64 year old female with a history of left ovarian epithelial carcinoma. She has now completed chemotherapy and presents for removal of her port catheter. EXAM: REMOVAL RIGHT IJ VEIN PORT-A-CATH MEDICATIONS: 2 g Ancef; The antibiotic was administered within an appropriate time interval prior to skin puncture. ANESTHESIA/SEDATION: None. FLUOROSCOPY TIME:  None. COMPLICATIONS: None immediate. PROCEDURE: Informed written consent was obtained from the patient after a thorough discussion of the procedural risks, benefits and alternatives. All questions were addressed. Maximal Sterile Barrier Technique was utilized including caps, mask, sterile gowns, sterile gloves, sterile drape, hand hygiene and skin antiseptic. A timeout was performed prior to the initiation of the procedure. The right chest was prepped and draped in a sterile fashion. Lidocaine was utilized for local anesthesia. An incision was made over the previously healed surgical incision. Utilizing blunt dissection, the port catheter and reservoir were removed from the underlying subcutaneous tissue in their entirety. Securing sutures were also removed. The pocket was irrigated with a copious amount of sterile normal saline. The pocket was closed with interrupted 3-0 Vicryl stitches. The subcutaneous tissue was closed with 3-0 Vicryl interrupted subcutaneous stitches. A 4-0 Vicryl running subcuticular stitch was utilized to approximate the skin. Dermabond was applied. IMPRESSION: Successful right IJ vein Port-A-Cath  explant. Electronically Signed   By: Jacqulynn Cadet M.D.   On: 06/03/2019 14:45

## 2019-07-04 ENCOUNTER — Ambulatory Visit: Payer: Managed Care, Other (non HMO)

## 2019-07-08 ENCOUNTER — Telehealth: Payer: Self-pay | Admitting: Oncology

## 2019-07-08 NOTE — Telephone Encounter (Signed)
Stacy Rivas said the rash is a little better but still itches.  She said it has not spread any further and is not as red.  She it taking Benadryl q 6 hours, Claritin and has one more prednisone tablet left to take tomorrow.  She is wondering if she needs to get a refill of the prednisone.  She is also wondering if she takes the prednisone longer, if it will interfere with her second Covid vaccine on 2/28.

## 2019-07-09 ENCOUNTER — Other Ambulatory Visit: Payer: Self-pay | Admitting: Hematology and Oncology

## 2019-07-09 MED ORDER — PREDNISONE 20 MG PO TABS: 20.0000 mg | ORAL_TABLET | Freq: Every day | ORAL | 0 refills | Status: DC

## 2019-07-09 MED FILL — predniSONE 20 MG TABS: 20 | 7 days supply | Qty: 7 | Fill #0

## 2019-07-09 NOTE — Telephone Encounter (Signed)
Called Stacy Rivas and notified her of message from Dr. Alvy Bimler.  She verbalized understanding and agreement.

## 2019-07-09 NOTE — Telephone Encounter (Signed)
I will refill her prednisone She can taper the benadryl to as needed It should not interfere with her vaccine

## 2019-08-13 ENCOUNTER — Other Ambulatory Visit: Payer: Self-pay | Admitting: Internal Medicine

## 2019-08-13 DIAGNOSIS — Z1231 Encounter for screening mammogram for malignant neoplasm of breast: Secondary | ICD-10-CM

## 2019-08-26 ENCOUNTER — Inpatient Hospital Stay: Payer: Managed Care, Other (non HMO)

## 2019-08-26 ENCOUNTER — Inpatient Hospital Stay: Payer: Managed Care, Other (non HMO) | Attending: Gynecologic Oncology | Admitting: Gynecologic Oncology

## 2019-08-26 ENCOUNTER — Other Ambulatory Visit: Payer: Self-pay

## 2019-08-26 VITALS — BP 146/85 | HR 76 | Temp 98.7°F | Resp 17 | Ht 62.0 in | Wt 144.8 lb

## 2019-08-26 DIAGNOSIS — Z90722 Acquired absence of ovaries, bilateral: Secondary | ICD-10-CM | POA: Insufficient documentation

## 2019-08-26 DIAGNOSIS — Z9221 Personal history of antineoplastic chemotherapy: Secondary | ICD-10-CM | POA: Insufficient documentation

## 2019-08-26 DIAGNOSIS — C563 Malignant neoplasm of bilateral ovaries: Secondary | ICD-10-CM

## 2019-08-26 DIAGNOSIS — Z87891 Personal history of nicotine dependence: Secondary | ICD-10-CM | POA: Insufficient documentation

## 2019-08-26 DIAGNOSIS — C562 Malignant neoplasm of left ovary: Secondary | ICD-10-CM | POA: Insufficient documentation

## 2019-08-26 DIAGNOSIS — Z90710 Acquired absence of both cervix and uterus: Secondary | ICD-10-CM | POA: Insufficient documentation

## 2019-08-26 DIAGNOSIS — C786 Secondary malignant neoplasm of retroperitoneum and peritoneum: Secondary | ICD-10-CM | POA: Diagnosis not present

## 2019-08-26 DIAGNOSIS — C561 Malignant neoplasm of right ovary: Secondary | ICD-10-CM

## 2019-08-26 LAB — CBC WITH DIFFERENTIAL (CANCER CENTER ONLY)
Abs Immature Granulocytes: 0.01 10*3/uL (ref 0.00–0.07)
Basophils Absolute: 0 10*3/uL (ref 0.0–0.1)
Basophils Relative: 0 %
Eosinophils Absolute: 0.1 10*3/uL (ref 0.0–0.5)
Eosinophils Relative: 1 %
HCT: 42.6 % (ref 36.0–46.0)
Hemoglobin: 14.3 g/dL (ref 12.0–15.0)
Immature Granulocytes: 0 %
Lymphocytes Relative: 27 %
Lymphs Abs: 2 10*3/uL (ref 0.7–4.0)
MCH: 32.4 pg (ref 26.0–34.0)
MCHC: 33.6 g/dL (ref 30.0–36.0)
MCV: 96.6 fL (ref 80.0–100.0)
Monocytes Absolute: 0.6 10*3/uL (ref 0.1–1.0)
Monocytes Relative: 8 %
Neutro Abs: 4.7 10*3/uL (ref 1.7–7.7)
Neutrophils Relative %: 64 %
Platelet Count: 232 10*3/uL (ref 150–400)
RBC: 4.41 MIL/uL (ref 3.87–5.11)
RDW: 11.9 % (ref 11.5–15.5)
WBC Count: 7.4 10*3/uL (ref 4.0–10.5)
nRBC: 0 % (ref 0.0–0.2)

## 2019-08-26 LAB — CMP (CANCER CENTER ONLY)
ALT: 37 U/L (ref 0–44)
AST: 31 U/L (ref 15–41)
Albumin: 4.1 g/dL (ref 3.5–5.0)
Alkaline Phosphatase: 101 U/L (ref 38–126)
Anion gap: 11 (ref 5–15)
BUN: 17 mg/dL (ref 8–23)
CO2: 28 mmol/L (ref 22–32)
Calcium: 9.6 mg/dL (ref 8.9–10.3)
Chloride: 105 mmol/L (ref 98–111)
Creatinine: 0.68 mg/dL (ref 0.44–1.00)
GFR, Est AFR Am: >60 mL/min (ref >60)
GFR, Estimated: >60 mL/min (ref >60)
Glucose, Bld: 90 mg/dL (ref 70–99)
Potassium: 3.8 mmol/L (ref 3.5–5.1)
Sodium: 144 mmol/L (ref 135–145)
Total Bilirubin: 1.4 mg/dL — ABNORMAL HIGH (ref 0.3–1.2)
Total Protein: 7 g/dL (ref 6.5–8.1)

## 2019-08-26 NOTE — Patient Instructions (Signed)
Please notify Dr Denman George at phone number [phone] if you notice vaginal bleeding, new pelvic or abdominal pains, bloating, feeling full easy, or a change in bladder or bowel function.    Please have Dr Alvy Bimler contact Dr Serita Grit office (at [phone]) in 3 months after your appointment with her to request an appointment with Dr Denman George for October 2021.

## 2019-08-27 ENCOUNTER — Telehealth: Payer: Self-pay | Admitting: *Deleted

## 2019-08-27 ENCOUNTER — Encounter: Payer: Self-pay | Admitting: Gynecologic Oncology

## 2019-08-27 LAB — CA 125: Cancer Antigen (CA) 125: 4.9 U/mL (ref 0.0–38.1)

## 2019-08-27 NOTE — Telephone Encounter (Signed)
Pt notified of stable CA 125 results.

## 2019-08-27 NOTE — Progress Notes (Signed)
Follow-up Note: Gyn-Onc  Stacy Rivas 64 y.o. female  CC:  Chief Complaint  Patient presents with  . 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 s/p completion of primary therapy (debulking and 6 cycles of neoadjuvant carboplatin and paclitaxel) completed on 03/31/20. Complete clinical response.  I counseled Stacy Rivas about her risk for recurrence and the importance of regular follow-up, particularly in the 1st 2 years.   She will return to see Dr Alvy Bimler in 3 months and myself in 6 months.  Her port is removed.  HPI:  The patient was seen originally in consultation at the request of Dr. Hilarie Fredrickson. PCP: Dr. Reynaldo Minium for pelvic mass and ascites.  Patient is a very pleasant G1P0 who took some antibiotics in 11/19 for a tooth issue and did not feel "right" after that. She then began having worse reflux and bloating in 3/20. She states that due to [place], she decided to "stick it out". She then communicated with Dr. Hilarie Fredrickson and a CT scan was ordered 11/09/18: Retroflexed uterus is seen containing multiple small fibroids, largest in the anterior lower uterine segment measuring 5.5 cm and showing cystic degeneration. A large poorly defined soft tissue mass is seen in the central pelvis which is contiguous with the anterior wall of the retroflexed uterus. This measures 14.7 x 9.5 by 11.3 cm, and is suspicious for ovarian carcinoma, with uterine leiomyosarcoma considered a less likely differential diagnosis. Moderate ascites is seen. Omental soft tissue caking and multiple peritoneal nodules are present in the abdomen and pelvis, consistent with diffuse peritoneal carcinomatosis.  She stated that in retrospect, her symptoms of reflux have been getting worse but she thought that it was due to her antibiotics. She was also having increased diarrhea, lower abdominal pressure and fullness, increased  gas and early satiety. She though that her hiatal hernia may also be worse. She states that she was always on the look out for bleeding as she is PMP, but was not aware of these symptoms.   She went through MP around [age] but never took any HRT. She took OCPs for about 10 years cumulatively in college.  She is not due for repeat colonoscopy until 2024.  She has never had an abnormal Pap smear.  At the time of diagnosis CA 125 was very elevated to 1434 on 11/13/18. Her Albumin was <3.5 and her large volume ascites required multiple paracenteses to control.   A tentative surgical date of June 25 have been set however due to scheduling issues this needed to be postponed.  Additionally given the multiple markers of low probability for complete cytoreduction (large volume ascites, primary tumor greater than 10 cm, Ca1 25 greater than 500, serum albumin less than 3.5, it was determined that a primary cytoreductive effort would not carry greater benefit that interval cytoreduction after neoadjuvant chemotherapy (based on 5 large RCT's which have documented equivalent survival between the two approaches).  Therefore, a decision was made with the patient to proceed with tissue biopsy followed by neoadjuvant chemotherapy with carboplatin paclitaxel.  Cytology from her paracenteses confirmed adenocarcinoma favor GYN primary (this was performed on November 21, 2018).  She commenced her first of 3 cycles of neoadjuvant carboplatin paclitaxel on December 06, 2018.  CA 125 on 02/01/19 showed a reduction to 187.  Cycle 3 was administered on January 17, 2019.  Repeat CT scan of the abdomen and pelvis was  performed on February 01, 2019.  It revealed the left complex ovarian mass measured 8.6 x 5.8 x 7.7 cm decreased from 14.7 x 9.5 x 11.3 cm.  There was a small volume of residual abdominal ascites tracking beside the liver and spleen.  The peritoneal nodularity omental caking is improved but persistent with a dominant implant  beneath the right mid anterior abdominal wall measuring 1 cm.  On February 12, 2019 she underwent an exploratory laparotomy TAH/BSO omentectomy radical tumor debulking.  Intraoperative findings were significant for an upper abdomen free of disease, no visible omental disease, small volume ascites, an 8 cm friable mass replacing left ovary and adherent to the sigmoid colon mesentery and ureter on the left.  It was an optimal cytoreduction, R0, with no gross visible residual disease remaining.  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  Postoperatively she did well and was discharged on postoperative day 2.  She had Ca1 25 drawn on February 19, 2019 in the postoperative period and this had reduced to 40.3.  Interval Hx:  She completed 3 additional cycles of adjuvant chemotherapy (carboplatin and paclitaxel), completed in November, 2021.  CA 125 on 03/31/20 was normal at 8.9  CA 125 on 05/21/20 was normal at 5.7  CA125 on 08/26/19 was 4.9.  CMP Latest Ref Rng & Units 08/26/2019 05/22/2019 04/22/2019  Glucose 70 - 99 mg/dL 90 95 137(H)  BUN 8 - 23 mg/dL _0 Creatinine 0.44 - 1.00 mg/dL 0.68 0.60 0.64  Sodium 135 - 145 mmol/L 144 143 142  Potassium 3.5 - 5.1 mmol/L 3.8 3.8 3.9  Chloride 98 - 111 mmol/L 105 107 107  CO2 22 - 32 mmol/L _1 Calcium 8.9 - 10.3 mg/dL 9.6 9.1 9.6  Total Protein 6.5 - 8.1 g/dL 7.0 6.5 7.0  Total Bilirubin 0.3 - 1.2 mg/dL 1.4(H) 1.3(H) 0.7  Alkaline Phos 38 - 126 U/L 101 162(H) 151(H)  AST 15 - 41 U/L 31 46(H) 38  ALT 0 - 44 U/L 37 60(H) 57(H)   CBC    Component Value Date/Time   WBC 7.4 08/26/2019 1352   WBC 4.4 06/03/2019 0835   RBC 4.41 08/26/2019 1352   HGB 14.3 08/26/2019 1352   HCT 42.6 08/26/2019 1352   PLT 232  08/26/2019 1352   MCV 96.6 08/26/2019 1352   MCH 32.4 08/26/2019 1352   MCHC 33.6 08/26/2019 1352   RDW 11.9 08/26/2019 1352   LYMPHSABS 2.0 08/26/2019 1352   MONOABS 0.6 08/26/2019 1352   EOSABS 0.1 08/26/2019 1352   BASOSABS 0.0 08/26/2019 1352    Review of Systems: Constitutional:Denies fever. Also trying to walk about 2-3 miles per day Skin: No rash Cardiovascular: No chest pain, shortness of breath, or edema  Pulmonary: No cough  Gastro Intestinal: Reporting intermittent lower abdominal soreness.  No nausea, vomiting, constipation,  Genitourinary: no pelvic pressure. Denies vaginal bleeding and discharge.  Musculoskeletal: Mild LBP   Psychology: + anxiety  Current Meds:  Outpatient Encounter Medications as of 08/26/2019  Medication Sig  . carboxymethylcellulose (REFRESH PLUS) 0.5 % SOLN Place 1 drop into both eyes 2 (two) times daily as needed (dry eyes).  . Cholecalciferol (VITAMIN D3) 25 MCG (1000 UT) CAPS Take 1,000 Units by mouth daily.   . diphenhydrAMINE (BENADRYL) 25 mg capsule  Take 25 mg by mouth at bedtime as needed for sleep.  [name] Kitchen esomeprazole (NEXIUM) 20 MG capsule Take 20 mg by mouth daily.   [name] Kitchen loratadine (CLARITIN) 10 MG tablet Take 10 mg by mouth daily as needed for allergies.  . Multiple Vitamin (MULTIVITAMIN WITH MINERALS) TABS tablet Take 1 tablet by mouth daily.  . [DISCONTINUED] acetaminophen (TYLENOL) 500 MG tablet Take 500 mg by mouth every 6 (six) hours as needed for mild pain.  . [DISCONTINUED] ALPRAZolam (XANAX) 0.25 MG tablet Take 1 tablet (0.25 mg total) by mouth at bedtime as needed for anxiety. (Patient not taking: Reported on 08/26/2019)  . [DISCONTINUED] predniSONE (DELTASONE) 20 MG tablet Take 1 tablet (20 mg total) by mouth daily with breakfast. (Patient not taking: Reported on 08/26/2019)  . [DISCONTINUED] senna-docusate (SENOKOT-S) 8.6-50 MG tablet Take 2 tablets by mouth at bedtime. For AFTER surgery, do not take if having diarrhea (Patient not taking:  Reported on 05/27/2019)   No facility-administered encounter medications on file as of 08/26/2019.    Allergy:  Allergies  Allergen Reactions  . Skin Adhesives [Cyanoacrylate] Hives  . Shrimp [Shellfish Allergy] Itching  . Sulfonamide Derivatives Itching and Nausea Only    Social Hx:   Social History   Socioeconomic History  . Marital status: Married    Spouse name: Not on file  . Number of children: 0  . Years of education: Not on file  . Highest education level: Not on file  Occupational History  . Occupation: Press photographer  Tobacco Use  . Smoking status: Former Smoker    Years: 10.00    Types: Cigarettes    Quit date: 05/23/1980    Years since quitting: 39.2  . Smokeless tobacco: Never Used  . Tobacco comment: quit 1984  Substance and Sexual Activity  . Alcohol use: Yes    Alcohol/week: 0.0 standard drinks    Comment: 2/day, 11-21-2018   no alcodholint he last month   . Drug use: No  . Sexual activity: Yes    Partners: Male  Other Topics Concern  . Not on file  Social History Narrative  . Not on file   Social Determinants of Health   Financial Resource Strain:   . Difficulty of Paying Living Expenses:   Food Insecurity:   . Worried About Charity fundraiser in the Last Year:   . Arboriculturist in the Last Year:   Transportation Needs:   . Film/video editor (Medical):   [name] Kitchen Lack of Transportation (Non-Medical):   Physical Activity:   . Days of Exercise per Week:   . Minutes of Exercise per Session:   Stress:   . Feeling of Stress :   Social Connections:   . Frequency of Communication with Friends and Family:   . Frequency of Social Gatherings with Friends and Family:   . Attends Religious Services:   . Active Member of Clubs or Organizations:   . Attends Archivist Meetings:   [name] Kitchen Marital Status:   Intimate Partner Violence:   . Fear of Current or Ex-Partner:   . Emotionally Abused:   [name] Kitchen Physically Abused:   . Sexually Abused:     Past Surgical  Hx:  Past Surgical History:  Procedure Laterality Date  . CATARACT EXTRACTION, BILATERAL    . cystoscopy with instillation for cystitis  2001  . DEBULKING N/A 02/12/2019   Procedure: DEBULKING OF TUMOR;  Surgeon: Everitt Amber, MD;  Location: [facility];  Service: Gynecology;  Laterality: N/A;  .  DILATION AND CURETTAGE OF UTERUS     miscarriage  . HYSTERECTOMY ABDOMINAL WITH SALPINGO-OOPHORECTOMY Bilateral 02/12/2019   Procedure: HYSTERECTOMY ABDOMINAL WITH SALPINGO-OOPHORECTOMY;  Surgeon: Everitt Amber, MD;  Location: [facility];  Service: Gynecology;  Laterality: Bilateral;  . IR IMAGING GUIDED PORT INSERTION  12/06/2018  . IR PARACENTESIS  12/03/2018  . IR PARACENTESIS  12/14/2018  . IR REMOVAL TUN ACCESS W/ PORT W/O FL MOD SED  06/03/2019  . LAPAROSCOPY     adhesions  . LITHOTRIPSY  07/2013   x 2  . MYOMECTOMY  1992  . OMENTECTOMY N/A 02/12/2019   Procedure: OMENTECTOMY;  Surgeon: Everitt Amber, MD;  Location: [facility];  Service: Gynecology;  Laterality: N/A;  . PARACENTESIS  11/21/2018   abdominal , removed 3.1 Liters     Past Medical Hx:  Past Medical History:  Diagnosis Date  . Anxiety state, unspecified   . Diverticulosis   . Esophageal reflux   . Family history of breast cancer   . Family history of lymphoma   . Family history of stomach cancer   . Family history of uterine cancer   . Fibroid tumor   . Headache    due to allergies   . Hiatal hernia   . Hyperlipidemia   . IBS (irritable bowel syndrome)   . Internal hemorrhoids   . Nonspecific elevation of levels of transaminase or lactic acid dehydrogenase (LDH)   . ovarian ca dx'd 11/2018  . Renal stone 07/2013  . Tubulovillous adenoma of colon   . Varicose veins    superficail thrombophlebitis (left LE)    Oncology Hx:  Oncology History Overview Note  Her 2 negative, MSI stable, serous Negative genetics   Left ovarian epithelial cancer ([place])  11/09/2018 Imaging   1. 14.7 cm poorly defined soft tissue mass in central pelvis  suspicious for primary ovarian carcinoma, with uterine leiomyosarcoma considered a less likely differential diagnosis. 2. Moderate ascites and diffuse peritoneal carcinomatosis. 3. Several small uterine fibroids.   11/13/2018 Tumor Marker   Patient's tumor was tested for the following markers: CA-125 Results of the tumor marker test revealed 1434   11/21/2018 Procedure   Successful ultrasound-guided diagnostic and therapeutic paracentesis yielding 3.1 liters of peritoneal fluid   11/21/2018 Pathology Results   PERITONEAL/ASCITIC FLUID(SPECIMEN 1 OF 1 COLLECTED 11/21/18): ADENOCARCINOMA. Specimen Clinical Information Pelvic mass suspicious for ovarian cancer Source Peritoneal/Ascitic Fluid, (specimen 1 of 1 collected 11/21/18) Gross Specimen: Received is/are 1000 ccs of dark amber fluid. (CM:cm) Prepared: # Smears: 0 # Concentration Technique Slides (i.e. 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   11/29/2018 Initial Diagnosis   Ovarian cancer ([place])   11/29/2018 Cancer Staging   Staging form: Ovary, Fallopian Tube, and Primary Peritoneal Carcinoma, AJCC 8th Edition - Clinical: cT3, cN0, cM0 - Signed by [name] Lark, MD on 11/29/2018   12/03/2018 Procedure   Successful ultrasound-guided therapeutic paracentesis yielding 3.7 liters of peritoneal fluid.     12/06/2018 Procedure   Placement of a subcutaneous port device. Catheter tip at the SVC and right atrium junction   12/14/2018 Procedure   Successful ultrasound-guided paracentesis yielding 2.9 L of peritoneal fluid   12/28/2018 Tumor Marker   Patient's tumor was tested for the following markers: CA-125 Results of the tumor marker test revealed 812.   12/28/2018 -  Chemotherapy   The patient had carboplatin and taxol for chemotherapy treatment.     02/01/2019 Imaging  Ct abdomen and pelvis 8.6 cm left ovarian mass, corresponding to the  patient's known primary neoplasm, improved.   Mild peritoneal nodularity/omental caking, improved.   Small abdominopelvic ascites, improved.     02/01/2019 Tumor Marker   Patient's tumor was tested for the following markers: CA-125 Results of the tumor marker test revealed 183.   02/12/2019 Surgery   Preoperative Diagnosis: Stage IIIC ovarian cancer, s/p neoadjuvant chemotherapy      Procedure(s) Performed: 1. Exploratory laparotomy with total abdominal hysterectomy, bilateral salpingo-oophorectomy, omentectomy radical tumor debulking for ovarian cancer.   Surgeon: Thereasa Solo, MD.    Operative Findings: upper abdomen free of disease. No visible omental disease. Small volume (200cc) ascites. 8cm friable mass replacing left ovary and adherent to the sigmoid colon mesentery and ureter on the left.  Anterior fibroid. This represented an optimal cytoreduction (R0) with no gross visible disease remaining.    02/12/2019 Pathology Results   A. OVARY AND FALLOPIAN TUBE, LEFT, SALPINGO-OOPHORECTOMY: - Serous carcinoma, high grade, status post neoadjuvant therapy - See oncology table and comment below B. UTERUS CERVIX WITH RIGHT FALLOPIAN TUBE AND OVARY, HYSTERECTOMY: Uterus: - Serosal surface involved by serous carcinoma - Endomyometrium uninvolved by carcinoma - Benign endometrial polyp (4.1 cm) - Leiomyomata (5.5 cm; largest) - Adenomyosis Cervix: - Uninvolved by carcinoma Left ovary: - Serous carcinoma, high grade Left fallopian tube: - Serous carcinoma, high grade C. SOFT TISSUE, LEFT PELVIC SIDEWALL TUMOR, EXCISION: - Metastatic serous carcinoma, high-grade D. SOFT TISSUE, SIGMOID COLON MESENTERY, EXCISION: - Metastatic serous carcinoma, high-grade E. OMENTUM, TUMOR RESECTION: - Metastatic serous carcinoma, high-grade OVARY or FALLOPIAN TUBE or PRIMARY PERITONEUM: Procedure: Total hysterectomy and bilateral salpingo-oophorectomy, omentectomy and peritoneal biopsies Specimen  Integrity: Fragmented Tumor Site: Left ovary and fallopian tube Ovarian Surface Involvement: Present Fallopian Tube Surface Involvement: Present Tumor Size: 6.3 cm (see comment) Histologic Type: Serous carcinoma Histologic Grade: High-grade Implants: Not applicable Other Tissue/ Organ Involvement: Right ovary, right fallopian tube, omentum, mesentery Largest Extrapelvic Peritoneal Focus: Macroscopic Peritoneal/Ascitic Fluid: Malignant Treatment Effect: No definite or minimal response identified (chemotherapy response score 1 [CRS1] Regional Lymph Nodes: No lymph nodes submitted or found Pathologic Stage Classification (pTNM, AJCC 8th Edition): pT3c, pNX Representative Tumor Block: A4 Comment(s): Additional testing (HER-2, MMR and MSI) are pending. The primary tumor site appears to be the left ovary and fallopian tube. The uterus is only involved on the serosal surface. There is tumor on the anterior peritoneal reflection.  Addendum: Tumor is Her2 negative,MSI stable   02/19/2019 Tumor Marker   Patient's tumor was tested for the following markers: CA-125 Results of the tumor marker test revealed 40.3   04/01/2019 Tumor Marker   Patient's tumor was tested for the following markers: CA-125 Results of the tumor marker test revealed 8.1    Genetic Testing   Negative testing. No pathogenic variants identified on the [employer]. The report date is 04/17/2019.  Somatic genes analyzed through [facility]: ATM, BARD1, BRCA1, BRCA2, BRIP1, CHEK2, MRE11A, NBN, PALB2, RAD51C, RAD51D.  The [facility]Next gene panel offered by [employer] includes sequencing and rearrangement analysis for the following 36 genes: APC*, ATM*, AXIN2, BARD1, BMPR1A, BRCA1*, BRCA2*, BRIP1*, CDH1*, CDK4, CDKN2A, CHEK2*, DICER1, MLH1*, MSH2*, MSH3, MSH6*, MUTYH*, NBN, NF1*, NTHL1, PALB2*, PMS2*, PTEN*, RAD51C*, RAD51D*, RECQL, SMAD4, SMARCA4, STK11 and TP53* (sequencing and deletion/duplication);  HOXB13, POLD1 and POLE (sequencing only); EPCAM and GREM1 (deletion/duplication only).    05/21/2019 Imaging   1. Interval hysterectomy, oophorectomy and omentectomy. No evidence of residual ovarian carcinoma  in the pelvis. No evidence of peritoneal disease. No intraperitoneal free fluid. 2. Nonobstructing LEFT renal calculi.  Normal ureters.   05/22/2019 Tumor Marker   Patient's tumor was tested for the following markers: CA-125 Results of the tumor marker test revealed 5.7.   06/03/2019 Procedure   Successful right IJ vein Port-A-Cath explant.     Family Hx:  Family History  Problem Relation Age of Onset  . Lymphoma Father        dx 55s  . Heart disease Father   . Hypertension Father   . Hypertension Mother   . Hyperlipidemia Mother   . Breast cancer Mother 60  . Diabetes Maternal Grandmother   . Uterine cancer Maternal Grandmother        dx 76s  . Stomach cancer Paternal Grandfather   . Breast cancer Other        grandmother's sisters, both dx 56s  . Colon cancer Neg Hx     Vitals:  Blood pressure (!) 146/85, pulse 76, temperature 98.7 F (37.1 C), temperature source Temporal, resp. rate 17, height 5' 2" (1.575 m), weight 144 lb 12.8 oz (65.7 kg), SpO2 100 %.  Physical Exam: Well-nourished well-developed female in no acute distress.  Neck: Supple, no lymphadenopathy, no thyromegaly.  Abdomen:  Soft abdomen, incision well healed, no ascites, no masses. No infection.   Groins: No lymphadenopathy.  Extremities: No edema.  Pelvic: vaginal cuff smooth, no lesions no palpable masses.  Thereasa Solo, MD 08/27/2019, 1:38 PM

## 2019-09-09 ENCOUNTER — Other Ambulatory Visit: Payer: Self-pay

## 2019-09-09 ENCOUNTER — Ambulatory Visit
Admission: RE | Admit: 2019-09-09 | Discharge: 2019-09-09 | Disposition: A | Discharge status: Home / Self Care | Payer: Managed Care, Other (non HMO) | Source: Ambulatory Visit | Attending: Internal Medicine | Admitting: Internal Medicine

## 2019-09-09 DIAGNOSIS — Z1231 Encounter for screening mammogram for malignant neoplasm of breast: Secondary | ICD-10-CM

## 2019-09-23 ENCOUNTER — Other Ambulatory Visit: Payer: Self-pay

## 2019-09-23 ENCOUNTER — Telehealth: Payer: Self-pay | Admitting: Oncology

## 2019-09-23 ENCOUNTER — Inpatient Hospital Stay: Payer: Managed Care, Other (non HMO) | Attending: Gynecologic Oncology

## 2019-09-23 DIAGNOSIS — G893 Neoplasm related pain (acute) (chronic): Secondary | ICD-10-CM | POA: Insufficient documentation

## 2019-09-23 DIAGNOSIS — C786 Secondary malignant neoplasm of retroperitoneum and peritoneum: Secondary | ICD-10-CM | POA: Diagnosis not present

## 2019-09-23 DIAGNOSIS — R300 Dysuria: Secondary | ICD-10-CM

## 2019-09-23 DIAGNOSIS — R748 Abnormal levels of other serum enzymes: Secondary | ICD-10-CM | POA: Diagnosis not present

## 2019-09-23 DIAGNOSIS — R21 Rash and other nonspecific skin eruption: Secondary | ICD-10-CM | POA: Insufficient documentation

## 2019-09-23 DIAGNOSIS — C562 Malignant neoplasm of left ovary: Secondary | ICD-10-CM | POA: Diagnosis present

## 2019-09-23 DIAGNOSIS — Z90710 Acquired absence of both cervix and uterus: Secondary | ICD-10-CM | POA: Diagnosis not present

## 2019-09-23 DIAGNOSIS — Z90722 Acquired absence of ovaries, bilateral: Secondary | ICD-10-CM | POA: Insufficient documentation

## 2019-09-23 DIAGNOSIS — Z5111 Encounter for antineoplastic chemotherapy: Secondary | ICD-10-CM | POA: Insufficient documentation

## 2019-09-23 LAB — URINALYSIS, COMPLETE (UACMP) WITH MICROSCOPIC
Bilirubin Urine: NEGATIVE
Glucose, UA: NEGATIVE mg/dL
Hgb urine dipstick: NEGATIVE
Ketones, ur: NEGATIVE mg/dL
Leukocytes,Ua: NEGATIVE
Nitrite: NEGATIVE
Protein, ur: NEGATIVE mg/dL
Specific Gravity, Urine: 1.001 — ABNORMAL LOW (ref 1.005–1.030)
pH: 6 (ref 5.0–8.0)

## 2019-09-23 NOTE — Telephone Encounter (Signed)
Stacy Rivas called and said she might have a UTI.  She has been having burning with urination, frequency and only going small amounts.  She also said she sometimes when she urinates, she has formed stool at the same time.  Arranged lab appointment for 11:30 today to check a UA and culture and advised her that we will call her with the results.

## 2019-09-24 LAB — URINE CULTURE: Culture: 10000 — AB

## 2019-09-25 ENCOUNTER — Telehealth: Payer: Self-pay | Admitting: Oncology

## 2019-09-25 ENCOUNTER — Encounter: Payer: Self-pay | Admitting: Oncology

## 2019-09-25 DIAGNOSIS — C562 Malignant neoplasm of left ovary: Secondary | ICD-10-CM

## 2019-09-25 NOTE — Telephone Encounter (Signed)
[facility] and advised her of urine culture results that showed 10,000 colonies/ml multiple species present.  Discussed that Dr. Denman George is recommending in have a CT scan done and then a possible referral to urology. Stacy Rivas verbalized agreement and understanding.  Scheduled CT for 10/02/19 at 3 pm.  Left Stacy Rivas a message with CT date, time and instructions.

## 2019-10-02 ENCOUNTER — Other Ambulatory Visit: Payer: Self-pay

## 2019-10-02 ENCOUNTER — Ambulatory Visit (HOSPITAL_COMMUNITY)
Admission: RE | Admit: 2019-10-02 | Discharge: 2019-10-02 | Disposition: A | Discharge status: Home / Self Care | Payer: Managed Care, Other (non HMO) | Source: Ambulatory Visit | Attending: Gynecologic Oncology | Admitting: Gynecologic Oncology

## 2019-10-02 DIAGNOSIS — C562 Malignant neoplasm of left ovary: Secondary | ICD-10-CM | POA: Diagnosis not present

## 2019-10-02 MED ORDER — SODIUM CHLORIDE (PF) 0.9 % IJ SOLN
INTRAMUSCULAR | Status: AC
  Filled 2019-10-02: qty 50

## 2019-10-02 MED ORDER — IOHEXOL 300 MG/ML  SOLN
100.0000 mL | Freq: Once | INTRAMUSCULAR | Status: AC | PRN
  Administered 2019-10-02: 100 mL via INTRAVENOUS

## 2019-10-04 ENCOUNTER — Telehealth: Payer: Self-pay | Admitting: Oncology

## 2019-10-04 NOTE — Telephone Encounter (Signed)
[facility] and discussed her CT scan results that showed new tumor deposits compatible with metastatic disease. Arranged for her to see Dr. Alvy Bimler on Monday, 10/07/19 to discuss treatment plan.  Also encouraged her to bring a family member to the appointment on Monday.  She verbalized understanding and agreement.

## 2019-10-07 ENCOUNTER — Inpatient Hospital Stay (HOSPITAL_BASED_OUTPATIENT_CLINIC_OR_DEPARTMENT_OTHER): Payer: Managed Care, Other (non HMO) | Admitting: Hematology and Oncology

## 2019-10-07 ENCOUNTER — Other Ambulatory Visit: Payer: Self-pay

## 2019-10-07 ENCOUNTER — Encounter: Payer: Self-pay | Admitting: Hematology and Oncology

## 2019-10-07 VITALS — BP 143/82 | HR 75 | Temp 98.5°F | Resp 18 | Ht 62.0 in | Wt 144.6 lb

## 2019-10-07 DIAGNOSIS — C562 Malignant neoplasm of left ovary: Secondary | ICD-10-CM | POA: Diagnosis not present

## 2019-10-07 DIAGNOSIS — Z7189 Other specified counseling: Secondary | ICD-10-CM | POA: Diagnosis not present

## 2019-10-07 NOTE — Assessment & Plan Note (Signed)
We have extensive discussions about goals of care She understood that the treatment goal is palliative We discussed the risk and benefits of applying for permanent disability/retirement from work She wished to continue working right now Given her job description, I think it is reasonable

## 2019-10-07 NOTE — Assessment & Plan Note (Addendum)
I have reviewed multiple imaging studies with the patient Unfortunately, she had disease relapse soon after discontinuation of chemotherapy She is mildly symptomatic with intermittent abdominal pain/discomfort We have extensive discussions about treatment options, whether she should go for combination chemotherapy of carboplatin with gemcitabine versus carboplatin with Doxil Each options were discussed in detail including infusion dosing, side effects to be expected, etc. Ultimately, she is in agreement to proceed with combination chemotherapy of carboplatin with gemcitabine  We discussed the role of chemotherapy is of palliative intent, in accordance with NCCN guidelines, based on publication below: Int J Gynecol Cancer. 2005 May-Jun;15 Suppl 1:36-41. Combination therapy with gemcitabine and carboplatin in recurrent ovarian cancer. Pfisterer J1, Vergote I, [place] A, Eisenhauer E; AGO-OVAR,; NCIC CTG; EORTC GCG.  Participants with recurrent platinum-sensitive ovarian cancer were randomly assigned to receive either gemcitabine-carboplatin or carboplatin every 21 days. The primary objective was to compare progression-free survival (PFS) between the groups. From September 1999 to April 2002, 356 patients (178 participants received gemcitabine-carboplatin, 178 received carboplatin only) were randomized to treatment. Patients received six cycles of either gemcitabine-carboplatin or carboplatin. With a median follow-up of 17 months, median PFS was 8.6 months for gemcitabine-carboplatin (95% confidence interval [CI] 7.9-9.7 months) and 5.8 months for carboplatin (95% CI 5.2-7.1 months; hazard ratio [HR] 0.72 [95% CI 0.58-0.90; P = 0.0032]). The response rate for the gemcitabine-carboplatin group was 47.2% (95% CI 39.9-54.5%) and 30.9% for carboplatin group (95% CI 24.1-37.7%; P = 0.0016). The HR for overall survival was 0.96 (95% CI 0.75-1.23; P = 0.7349). Patients treated with gemcitabine-carboplatin reported  significantly faster palliation of abdominal symptoms and a significantly improved global quality of life. Gemcitabine-carboplatin treatment significantly improves the PFS of patients with platinum-sensitive recurrent ovarian cancer.  We discussed the role of chemotherapy. The intent is of palliative intent.  We discussed some of the risks, benefits, side-effects of carboplatin & gemcitabine  Some of the short term side-effects included, though not limited to, including weight loss, life threatening infections, risk of allergic reactions, need for transfusions of blood products, nausea, vomiting, change in bowel habits, loss of hair, admission to hospital for various reasons, and risks of death.   Long term side-effects are also discussed including risks of infertility, permanent damage to nerve function, hearing loss, chronic fatigue, kidney damage with possibility needing hemodialysis, and rare secondary malignancy including bone marrow disorders.  The patient is aware that the response rates discussed earlier is not guaranteed.  After a long discussion, patient made an informed decision to proceed with the prescribed plan of care.   Patient education material was dispensed  I recommend port placement I will see her back next week prior to starting date of treatment for further follow-up I plan to repeat CT imaging after 3 cycles of therapy If she have good response to treatment with less carcinomatosis, I plan to add bevacizumab and eventually drop carboplatin She is in agreement with the plan of care

## 2019-10-07 NOTE — Progress Notes (Signed)
DISCONTINUE ON PATHWAY REGIMEN - Ovarian  No Medical Intervention - Off Treatment.  REASON: Disease Progression PRIOR TREATMENT: Off Treatment  START OFF PATHWAY REGIMEN - Ovarian   [id number]:Carboplatin AUC=4 D1 + Gemcitabine 800 mg/m2 D1, 8 q21 Days:   A cycle is every 21 days:     Gemcitabine      Carboplatin   **Always confirm dose/schedule in your pharmacy ordering system**  Patient Characteristics: Recurrent or Progressive Disease, Second Line, Platinum Sensitive and ? 6 Months Since Last Therapy, Not a Candidate for Secondary Debulking Surgery Therapeutic Status: Recurrent or Progressive Disease BRCA Mutation Status: Absent Line of Therapy: Second Line  Intent of Therapy: Non-Curative / Palliative Intent, Discussed with Patient

## 2019-10-07 NOTE — Progress Notes (Signed)
DISCONTINUE ON PATHWAY REGIMEN - Ovarian     A cycle is every 21 days:     Paclitaxel      Carboplatin   **Always confirm dose/schedule in your pharmacy ordering system**  REASON: Other Reason PRIOR TREATMENT: [id number]: Carboplatin AUC=6 + Paclitaxel 175 mg/m2 q21 Days x 2-4 Cycles TREATMENT RESPONSE: Complete Response (CR)  Ovarian - No Medical Intervention - Off Treatment.  Patient Characteristics: Preoperative or Nonsurgical Candidate (Clinical Staging), Newly Diagnosed, Neoadjuvant Therapy followed by Surgery Therapeutic Status: Preoperative or Nonsurgical Candidate (Clinical Staging) BRCA Mutation Status: Awaiting Test Results AJCC T Category: cT3 AJCC 8 Stage Grouping: Unknown AJCC N Category: cN0 AJCC M Category: cM0 Therapy Plan: Neoadjuvant Therapy followed by Surgery

## 2019-10-07 NOTE — Progress Notes (Signed)
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 Ultimately, she is in agreement to proceed with combination chemotherapy of carboplatin with gemcitabine  We discussed the role of chemotherapy is of palliative intent, in accordance with NCCN guidelines, based on publication below: Int J Gynecol Cancer. 2005 May-Jun;15 Suppl 1:36-41. Combination therapy with gemcitabine and carboplatin in recurrent ovarian cancer. Pfisterer J1, [place] I, [place] A, Eisenhauer E; AGO-OVAR,; NCIC CTG; EORTC GCG.  Participants with recurrent platinum-sensitive ovarian cancer were randomly assigned to receive either gemcitabine-carboplatin or carboplatin every 21 days. The primary objective was to compare progression-free survival (PFS) between the groups. From September 1999 to April 2002, 356 patients (178 participants received gemcitabine-carboplatin, 178 received carboplatin only) were randomized to treatment. Patients received six cycles of either gemcitabine-carboplatin or carboplatin. With a median follow-up of 17 months, median PFS was 8.6 months for gemcitabine-carboplatin (95% confidence interval [CI] 7.9-9.7 months) and 5.8 months for carboplatin (95% CI 5.2-7.1 months; hazard ratio [HR] 0.72 [95% CI 0.58-0.90; P = 0.0032]). The response rate for the  gemcitabine-carboplatin group was 47.2% (95% CI 39.9-54.5%) and 30.9% for carboplatin group (95% CI 24.1-37.7%; P = 0.0016). The HR for overall survival was 0.96 (95% CI 0.75-1.23; P = 0.7349). Patients treated with gemcitabine-carboplatin reported significantly faster palliation of abdominal symptoms and a significantly improved global quality of life. Gemcitabine-carboplatin treatment significantly improves the PFS of patients with platinum-sensitive recurrent ovarian cancer.  We discussed the role of chemotherapy. The intent is of palliative intent.  We discussed some of the risks, benefits, side-effects of carboplatin & gemcitabine  Some of the short term side-effects included, though not limited to, including weight loss, life threatening infections, risk of allergic reactions, need for transfusions of blood products, nausea, vomiting, change in bowel habits, loss of hair, admission to hospital for various reasons, and risks of death.   Long term side-effects are also discussed including risks of infertility, permanent damage to nerve function, hearing loss, chronic fatigue, kidney damage with possibility needing hemodialysis, and rare secondary malignancy including bone marrow disorders.  The patient is aware that the response rates discussed earlier is not guaranteed.  After a long discussion, patient made an informed decision to proceed with the prescribed plan of care.   Patient education material was dispensed  I recommend port placement I will see her back next week prior to starting date of treatment for further follow-up I plan to repeat CT imaging after 3 cycles of therapy If she have good response to treatment with less carcinomatosis, I plan to add bevacizumab and eventually drop carboplatin She is in agreement with the plan of care  Goals of care, counseling/discussion We have extensive discussions about goals of care She understood that the treatment goal is palliative We  discussed the risk and benefits of applying for permanent disability/retirement from work She wished to continue working right now Given her job description, I think it is reasonable   Orders Placed This  Encounter  Procedures  . IR IMAGING GUIDED PORT INSERTION    Standing Status:   Future    Standing Expiration Date:   12/06/2020    Order Specific Question:   Reason for Exam (SYMPTOM  OR DIAGNOSIS REQUIRED)    Answer:   need port for chemo on 5/24    Order Specific Question:   Preferred Imaging Location?    Answer:   [facility]  . CBC with Differential ([place] Only)    Standing Status:   Standing    Number of Occurrences:   20    Standing Expiration Date:   10/06/2020  . CMP ([place] only)    Standing Status:   Standing    Number of Occurrences:   20    Standing Expiration Date:   10/06/2020    All questions were answered. The patient knows to call the clinic with any problems, questions or concerns. The total time spent in the appointment was 80 minutes encounter with patients including review of chart and various tests results, discussions about plan of care and coordination of care plan   [name] Lark, MD 10/07/2019 3:42 PM  INTERVAL HISTORY: Please see below for problem oriented charting. She is seen urgently due to recent CT findings of cancer relapse Since last time I saw her, she has been doing well She have some vague abdominal discomfort that started not long ago At first, she thought she had a urinary tract infection but test results came back within normal limits Unfortunately, CT imaging show evidence of disease She feels fine today No recent nausea, weight loss or changes in bowel habits Her niece, Marye Round is also available over the phone for discussion about treatment options and goals of care SUMMARY OF ONCOLOGIC HISTORY: Oncology History Overview Note  Her 2 negative, MSI stable, 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 14.7 cm poorly defined soft tissue mass in central pelvis suspicious for primary ovarian carcinoma, with uterine leiomyosarcoma considered a less likely differential diagnosis. 2. Moderate ascites and diffuse peritoneal carcinomatosis. 3. Several small uterine fibroids.   11/13/2018 Tumor Marker   Patient's tumor was tested for the following markers: CA-125 Results of the tumor marker test revealed 1434   11/21/2018 Procedure   Successful ultrasound-guided diagnostic and therapeutic paracentesis yielding 3.1 liters of peritoneal fluid   11/21/2018 Pathology Results   PERITONEAL/ASCITIC FLUID(SPECIMEN 1 OF 1 COLLECTED 11/21/18): ADENOCARCINOMA. Specimen Clinical Information Pelvic mass suspicious for ovarian cancer Source Peritoneal/Ascitic Fluid, (specimen 1 of 1 collected 11/21/18) Gross Specimen: Received is/are 1000 ccs of dark amber fluid. (CM:cm) Prepared: # Smears: 0 # Concentration Technique Slides (i.e. 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   11/29/2018 Initial Diagnosis   Ovarian cancer ([place])   11/29/2018 Cancer Staging   Staging form: Ovary, Fallopian Tube, and Primary Peritoneal Carcinoma, AJCC 8th Edition - Clinical: cT3, cN0, cM0 - Signed by [name] Lark, MD on 11/29/2018   12/03/2018 Procedure   Successful ultrasound-guided therapeutic paracentesis yielding 3.7 liters of peritoneal fluid.     12/06/2018 Procedure   Placement of a subcutaneous port device. Catheter tip at the SVC and right atrium junction   12/14/2018 Procedure   Successful ultrasound-guided paracentesis yielding 2.9 L of peritoneal fluid   12/28/2018 Tumor Marker   Patient's tumor was tested for the following markers: CA-125 Results of the tumor marker  test revealed 812.   12/28/2018 - 04/22/2019 Chemotherapy   The patient had carboplatin and taxol for neoadjuvant treatment,  followed by interval debulking surgery and subsequent adjuvant chemotherapy treatment.     02/01/2019 Imaging   Ct abdomen and pelvis 8.6 cm left ovarian mass, corresponding to the patient's known primary neoplasm, improved.   Mild peritoneal nodularity/omental caking, improved.   Small abdominopelvic ascites, improved.     02/01/2019 Tumor Marker   Patient's tumor was tested for the following markers: CA-125 Results of the tumor marker test revealed 183.   02/12/2019 Surgery   Preoperative Diagnosis: Stage IIIC ovarian cancer, s/p neoadjuvant chemotherapy      Procedure(s) Performed: 1. Exploratory laparotomy with total abdominal hysterectomy, bilateral salpingo-oophorectomy, omentectomy radical tumor debulking for ovarian cancer.   Surgeon: Thereasa Solo, MD.    Operative Findings: upper abdomen free of disease. No visible omental disease. Small volume (200cc) ascites. 8cm friable mass replacing left ovary and adherent to the sigmoid colon mesentery and ureter on the left.  Anterior fibroid. This represented an optimal cytoreduction (R0) with no gross visible disease remaining.    02/12/2019 Pathology Results   A. OVARY AND FALLOPIAN TUBE, LEFT, SALPINGO-OOPHORECTOMY: - Serous carcinoma, high grade, status post neoadjuvant therapy - See oncology table and comment below B. UTERUS CERVIX WITH RIGHT FALLOPIAN TUBE AND OVARY, HYSTERECTOMY: Uterus: - Serosal surface involved by serous carcinoma - Endomyometrium uninvolved by carcinoma - Benign endometrial polyp (4.1 cm) - Leiomyomata (5.5 cm; largest) - Adenomyosis Cervix: - Uninvolved by carcinoma Left ovary: - Serous carcinoma, high grade Left fallopian tube: - Serous carcinoma, high grade C. SOFT TISSUE, LEFT PELVIC SIDEWALL TUMOR, EXCISION: - Metastatic serous carcinoma, high-grade D. SOFT TISSUE, SIGMOID COLON MESENTERY, EXCISION: - Metastatic serous carcinoma, high-grade E. OMENTUM, TUMOR RESECTION: - Metastatic serous  carcinoma, high-grade OVARY or FALLOPIAN TUBE or PRIMARY PERITONEUM: Procedure: Total hysterectomy and bilateral salpingo-oophorectomy, omentectomy and peritoneal biopsies Specimen Integrity: Fragmented Tumor Site: Left ovary and fallopian tube Ovarian Surface Involvement: Present Fallopian Tube Surface Involvement: Present Tumor Size: 6.3 cm (see comment) Histologic Type: Serous carcinoma Histologic Grade: High-grade Implants: Not applicable Other Tissue/ Organ Involvement: Right ovary, right fallopian tube, omentum, mesentery Largest Extrapelvic Peritoneal Focus: Macroscopic Peritoneal/Ascitic Fluid: Malignant Treatment Effect: No definite or minimal response identified (chemotherapy response score 1 [CRS1] Regional Lymph Nodes: No lymph nodes submitted or found Pathologic Stage Classification (pTNM, AJCC 8th Edition): pT3c, pNX Representative Tumor Block: A4 Comment(s): Additional testing (HER-2, MMR and MSI) are pending. The primary tumor site appears to be the left ovary and fallopian tube. The uterus is only involved on the serosal surface. There is tumor on the anterior peritoneal reflection.  Addendum: Tumor is Her2 negative,MSI stable   02/19/2019 Tumor Marker   Patient's tumor was tested for the following markers: CA-125 Results of the tumor marker test revealed 40.3   04/01/2019 Tumor Marker   Patient's tumor was tested for the following markers: CA-125 Results of the tumor marker test revealed 8.1    Genetic Testing   Negative testing. No pathogenic variants identified on the [employer]. The report date is 04/17/2019.  Somatic genes analyzed through [facility]: ATM, BARD1, BRCA1, BRCA2, BRIP1, CHEK2, MRE11A, NBN, PALB2, RAD51C, RAD51D.  The CancerNext gene panel offered by [employer] includes sequencing and rearrangement analysis for the following 36 genes: APC*, ATM*, AXIN2, BARD1, BMPR1A, BRCA1*, BRCA2*, BRIP1*, CDH1*, CDK4, CDKN2A,  CHEK2*, DICER1, MLH1*, MSH2*, MSH3, MSH6*, MUTYH*, NBN, NF1*, NTHL1, PALB2*, PMS2*, PTEN*, RAD51C*, RAD51D*, RECQL,  SMAD4, SMARCA4, STK11 and TP53* (sequencing and deletion/duplication); HOXB13, POLD1 and POLE (sequencing only); EPCAM and GREM1 (deletion/duplication only).    05/21/2019 Imaging   1. Interval hysterectomy, oophorectomy and omentectomy. No evidence of residual ovarian carcinoma in the pelvis. No evidence of peritoneal disease. No intraperitoneal free fluid. 2. Nonobstructing LEFT renal calculi.  Normal ureters.   05/22/2019 Tumor Marker   Patient's tumor was tested for the following markers: CA-125 Results of the tumor marker test revealed 5.7.   06/03/2019 Procedure   Successful right IJ vein Port-A-Cath explant.   08/26/2019 Tumor Marker   Patient's tumor was tested for the following markers: CA-125. Results of the tumor marker test revealed 4.9   10/03/2019 Imaging   1. New tumor deposits along the capsular surfaces of the liver, spleen, in the left pelvis, and right paracolic gutter, compatible with metastatic disease. 2. Mild dilation of the dorsal pancreatic duct in the pancreatic body and head, cause uncertain. 3. Nonobstructive left nephrolithiasis. 4. Aortic atherosclerosis.   Aortic Atherosclerosis (ICD10-I70.0   10/14/2019 -  Chemotherapy   The patient had palonosetron (ALOXI) injection 0.25 mg, 0.25 mg, Intravenous,  Once, 0 of 3 cycles CARBOplatin (PARAPLATIN) in sodium chloride 0.9 % 100 mL chemo infusion, , Intravenous,  Once, 0 of 3 cycles gemcitabine (GEMZAR) 1,368 mg in sodium chloride 0.9 % 250 mL chemo infusion, 800 mg/m2, Intravenous,  Once, 0 of 3 cycles fosaprepitant (EMEND) 150 mg in sodium chloride 0.9 % 145 mL IVPB, 150 mg, Intravenous,  Once, 0 of 3 cycles  for chemotherapy treatment.      REVIEW OF SYSTEMS:   Constitutional: Denies fevers, chills or abnormal weight loss Eyes: Denies blurriness of vision Ears, nose, mouth, throat, and face:  Denies mucositis or sore throat Respiratory: Denies cough, dyspnea or wheezes Cardiovascular: Denies palpitation, chest discomfort or lower extremity swelling Skin: Denies abnormal skin rashes Lymphatics: Denies new lymphadenopathy or easy bruising Neurological:Denies numbness, tingling or new weaknesses Behavioral/Psych: Mood is stable, no new changes  All other systems were reviewed with the patient and are negative.  I have reviewed the past medical history, past surgical history, social history and family history with the patient and they are unchanged from previous note.  ALLERGIES:  is allergic to skin adhesives [cyanoacrylate]; shrimp [shellfish allergy]; and sulfonamide derivatives.  MEDICATIONS:  Current Outpatient Medications  Medication Sig Dispense Refill  . carboxymethylcellulose (REFRESH PLUS) 0.5 % SOLN Place 1 drop into both eyes 2 (two) times daily as needed (dry eyes).    . Cholecalciferol (VITAMIN D3) 25 MCG (1000 UT) CAPS Take 1,000 Units by mouth daily.     . diphenhydrAMINE (BENADRYL) 25 mg capsule Take 25 mg by mouth at bedtime as needed for sleep.    [name] Kitchen esomeprazole (NEXIUM) 20 MG capsule Take 20 mg by mouth daily.     [name] Kitchen loratadine (CLARITIN) 10 MG tablet Take 10 mg by mouth daily as needed for allergies.    . Multiple Vitamin (MULTIVITAMIN WITH MINERALS) TABS tablet Take 1 tablet by mouth daily.     No current facility-administered medications for this visit.    PHYSICAL EXAMINATION: ECOG PERFORMANCE STATUS: 1 - Symptomatic but completely ambulatory  Vitals:   10/07/19 1427  BP: (!) 143/82  Pulse: 75  Resp: 18  Temp: 98.5 F (36.9 C)  SpO2: 100%   Filed Weights   10/07/19 1427  Weight: 144 lb 9.6 oz (65.6 kg)    GENERAL:alert, no distress and comfortable NEURO: alert & oriented x 3 with  fluent speech, no focal motor/sensory deficits  LABORATORY DATA:  I have reviewed the data as listed    Component Value Date/Time   NA 144 08/26/2019 1352   K  3.8 08/26/2019 1352   CL 105 08/26/2019 1352   CO2 28 08/26/2019 1352   GLUCOSE 90 08/26/2019 1352   BUN 17 08/26/2019 1352   CREATININE 0.68 08/26/2019 1352   CALCIUM 9.6 08/26/2019 1352   PROT 7.0 08/26/2019 1352   ALBUMIN 4.1 08/26/2019 1352   AST 31 08/26/2019 1352   ALT 37 08/26/2019 1352   ALKPHOS 101 08/26/2019 1352   BILITOT 1.4 (H) 08/26/2019 1352   GFRNONAA >60 08/26/2019 1352   GFRAA >60 08/26/2019 1352    No results found for: SPEP, UPEP  Lab Results  Component Value Date   WBC 7.4 08/26/2019   NEUTROABS 4.7 08/26/2019   HGB 14.3 08/26/2019   HCT 42.6 08/26/2019   MCV 96.6 08/26/2019   PLT 232 08/26/2019      Chemistry      Component Value Date/Time   NA 144 08/26/2019 1352   K 3.8 08/26/2019 1352   CL 105 08/26/2019 1352   CO2 28 08/26/2019 1352   BUN 17 08/26/2019 1352   CREATININE 0.68 08/26/2019 1352      Component Value Date/Time   CALCIUM 9.6 08/26/2019 1352   ALKPHOS 101 08/26/2019 1352   AST 31 08/26/2019 1352   ALT 37 08/26/2019 1352   BILITOT 1.4 (H) 08/26/2019 1352       RADIOGRAPHIC STUDIES: I have reviewed multiple CT imaging with the patient and her niece I have personally reviewed the radiological images as listed and agreed with the findings in the report. CT Abdomen Pelvis W Contrast  Result Date: 10/03/2019 CLINICAL DATA:  Surveillance of left ovarian cancer. Prior debulking surgery and chemotherapy. Intermittent right upper quadrant abdominal pain. EXAM: CT ABDOMEN AND PELVIS WITH CONTRAST TECHNIQUE: Multidetector CT imaging of the abdomen and pelvis was performed using the standard protocol following bolus administration of intravenous contrast. CONTRAST:  168m OMNIPAQUE IOHEXOL 300 MG/ML  SOLN COMPARISON:  05/22/2019 FINDINGS: Lower chest: Mild cardiomegaly. Mild scarring in the left lower lobe and lingula. Hepatobiliary: New low-density tumor deposit or lymph node in the porta hepatis along the posterior margin of the falciform  ligament and anterior to the caudate lobe, measuring 1.0 by 1.7 by 1.6 cm on image 15/2. The gallbladder appears unremarkable. New tumor deposit along the medial capsular margin of the right hepatic lobe measuring 3.6 by 1.5 by 3.0 cm on image 45/5. Pancreas: Mild dilation of the dorsal pancreatic duct in pancreatic body and head, cause uncertain. Otherwise unremarkable. Spleen: New 2.8 by 1.2 by 1.5 cm tumor deposit along the inferomedial splenic capsule as shown on image 23/5. Adrenals/Urinary Tract: 4 clustered calculi in the left kidney upper pole, largest 0.8 cm in long axis on image 31/5. The left ureter is near some of the tumor at the level of the iliac vessel cross over, but is not dilated. Stomach/Bowel: Unremarkable Vascular/Lymphatic: Aortoiliac atherosclerotic vascular disease. Reproductive: Uterus absent. Prior oophorectomy. Other: New tumor nodularity in the right paracolic gutter including a 0.9 by 0.6 cm nodule on image 54/2. Anteromedial to the left psoas muscle and iliac bifurcation, a new 3.7 by 2.4 by 5.7 cm cystic and solid mass is present compatible malignancy. Just below this there is a smaller fluid density structure measuring 1.4 by 0.6 cm on image 66/2. Trace free pelvic fluid. Musculoskeletal: Unremarkable IMPRESSION: 1.  New tumor deposits along the capsular surfaces of the liver, spleen, in the left pelvis, and right paracolic gutter, compatible with metastatic disease. 2. Mild dilation of the dorsal pancreatic duct in the pancreatic body and head, cause uncertain. 3. Nonobstructive left nephrolithiasis. 4. Aortic atherosclerosis. Aortic Atherosclerosis (ICD10-I70.0). Electronically Signed   By: Van Clines M.D.   On: 10/03/2019 14:56   MM 3D SCREEN BREAST BILATERAL  Result Date: 09/09/2019 CLINICAL DATA:  Screening. EXAM: DIGITAL SCREENING BILATERAL MAMMOGRAM WITH TOMO AND CAD COMPARISON:  Previous exam(s). ACR Breast Density Category c: The breast tissue is heterogeneously  dense, which may obscure small masses. FINDINGS: There are no findings suspicious for malignancy. Images were processed with CAD. IMPRESSION: No mammographic evidence of malignancy. A result letter of this screening mammogram will be mailed directly to the patient. RECOMMENDATION: Screening mammogram in one year. (Code:[id number]-B-01Y) BI-RADS CATEGORY  1: Negative. Electronically Signed   By: [name] M.D.   On: 09/09/2019 14:36

## 2019-10-08 ENCOUNTER — Other Ambulatory Visit: Payer: Self-pay | Admitting: Radiology

## 2019-10-08 ENCOUNTER — Telehealth: Payer: Self-pay | Admitting: Hematology and Oncology

## 2019-10-08 NOTE — Telephone Encounter (Signed)
Scheduled per 5/17 sch message. Pt aware of appts on 5/24 and 6/1.

## 2019-10-08 NOTE — Progress Notes (Signed)
Pharmacist Chemotherapy Monitoring - Initial Assessment    Anticipated start date: 10/14/2019  Regimen:  . Are orders appropriate based on the patient's diagnosis, regimen, and cycle? Yes . Does the plan date match the patient's scheduled date? Yes . Is the sequencing of drugs appropriate? Yes . Are the premedications appropriate for the patient's regimen? Yes . Prior Authorization for treatment is: Not Started o If applicable, is the correct biosimilar selected based on the patient's insurance? not applicable  Organ Function and Labs: [name] Kitchen Are dose adjustments needed based on the patient's renal function, hepatic function, or hematologic function? Yes . Are appropriate labs ordered prior to the start of patient's treatment? Yes . Other organ system assessment, if indicated: N/A . The following baseline labs, if indicated, have been ordered: N/A  Dose Assessment: . Are the drug doses appropriate? Yes . Are the following correct: o Drug concentrations Yes o IV fluid compatible with drug Yes o Administration routes Yes o Timing of therapy Yes . If applicable, does the patient have documented access for treatment and/or plans for port-a-cath placement? no . If applicable, have lifetime cumulative doses been properly documented and assessed? no Lifetime Dose Tracking  . Carboplatin: 3,290 mg = 0.01 % of the maximum lifetime dose of 999,999,999 mg  o   Toxicity Monitoring/Prevention: . The patient has the following take home antiemetics prescribed: none . The patient has the following take home medications prescribed: N/A . Medication allergies and previous infusion related reactions, if applicable, have been reviewed and addressed. No . The patient's current medication list has been assessed for drug-drug interactions with their chemotherapy regimen. no significant drug-drug interactions were identified on review.  Order Review: . Are the treatment plan orders signed? No . Is the  patient scheduled to see a provider prior to their treatment? Yes  I verify that I have reviewed each item in the above checklist and answered each question accordingly.  Dorise Gangi D 10/08/2019 10:59 AM

## 2019-10-10 ENCOUNTER — Ambulatory Visit (HOSPITAL_COMMUNITY)
Admission: RE | Admit: 2019-10-10 | Discharge: 2019-10-10 | Disposition: A | Discharge status: Home / Self Care | Payer: Managed Care, Other (non HMO) | Source: Ambulatory Visit | Attending: Hematology and Oncology | Admitting: Hematology and Oncology

## 2019-10-10 ENCOUNTER — Encounter (HOSPITAL_COMMUNITY): Payer: Self-pay

## 2019-10-10 ENCOUNTER — Other Ambulatory Visit: Payer: Self-pay

## 2019-10-10 DIAGNOSIS — E785 Hyperlipidemia, unspecified: Secondary | ICD-10-CM | POA: Insufficient documentation

## 2019-10-10 DIAGNOSIS — K589 Irritable bowel syndrome without diarrhea: Secondary | ICD-10-CM | POA: Diagnosis not present

## 2019-10-10 DIAGNOSIS — K219 Gastro-esophageal reflux disease without esophagitis: Secondary | ICD-10-CM | POA: Insufficient documentation

## 2019-10-10 DIAGNOSIS — K5790 Diverticulosis of intestine, part unspecified, without perforation or abscess without bleeding: Secondary | ICD-10-CM | POA: Insufficient documentation

## 2019-10-10 DIAGNOSIS — F419 Anxiety disorder, unspecified: Secondary | ICD-10-CM | POA: Diagnosis not present

## 2019-10-10 DIAGNOSIS — K449 Diaphragmatic hernia without obstruction or gangrene: Secondary | ICD-10-CM | POA: Insufficient documentation

## 2019-10-10 DIAGNOSIS — C569 Malignant neoplasm of unspecified ovary: Secondary | ICD-10-CM | POA: Insufficient documentation

## 2019-10-10 DIAGNOSIS — Z87891 Personal history of nicotine dependence: Secondary | ICD-10-CM | POA: Diagnosis not present

## 2019-10-10 DIAGNOSIS — C562 Malignant neoplasm of left ovary: Secondary | ICD-10-CM

## 2019-10-10 HISTORY — PX: IR IMAGING GUIDED PORT INSERTION: IMG5740

## 2019-10-10 LAB — CBC WITH DIFFERENTIAL/PLATELET
Abs Immature Granulocytes: 0.01 10*3/uL (ref 0.00–0.07)
Basophils Absolute: 0 10*3/uL (ref 0.0–0.1)
Basophils Relative: 1 %
Eosinophils Absolute: 0 10*3/uL (ref 0.0–0.5)
Eosinophils Relative: 0 %
HCT: 40.2 % (ref 36.0–46.0)
Hemoglobin: 13.9 g/dL (ref 12.0–15.0)
Immature Granulocytes: 0 %
Lymphocytes Relative: 25 %
Lymphs Abs: 1.4 10*3/uL (ref 0.7–4.0)
MCH: 33 pg (ref 26.0–34.0)
MCHC: 34.6 g/dL (ref 30.0–36.0)
MCV: 95.5 fL (ref 80.0–100.0)
Monocytes Absolute: 0.3 10*3/uL (ref 0.1–1.0)
Monocytes Relative: 6 %
Neutro Abs: 4 10*3/uL (ref 1.7–7.7)
Neutrophils Relative %: 68 %
Platelets: 240 10*3/uL (ref 150–400)
RBC: 4.21 MIL/uL (ref 3.87–5.11)
RDW: 12.2 % (ref 11.5–15.5)
WBC: 5.8 10*3/uL (ref 4.0–10.5)
nRBC: 0 % (ref 0.0–0.2)

## 2019-10-10 LAB — PROTIME-INR
INR: 1 (ref 0.8–1.2)
Prothrombin Time: 12.6 seconds (ref 11.4–15.2)

## 2019-10-10 MED ORDER — CEFAZOLIN SODIUM-DEXTROSE 2-4 GM/100ML-% IV SOLN
INTRAVENOUS | Status: AC
  Administered 2019-10-10: 2 g via INTRAVENOUS
  Filled 2019-10-10: qty 100

## 2019-10-10 MED ORDER — LIDOCAINE-EPINEPHRINE 1 %-1:100000 IJ SOLN
INTRAMUSCULAR | Status: AC
  Filled 2019-10-10: qty 1

## 2019-10-10 MED ORDER — SODIUM CHLORIDE 0.9 % IV SOLN: INTRAVENOUS | Status: DC

## 2019-10-10 MED ORDER — HEPARIN SOD (PORK) LOCK FLUSH 100 UNIT/ML IV SOLN
INTRAVENOUS | Status: AC | PRN
  Administered 2019-10-10: 500 [IU] via INTRAVENOUS

## 2019-10-10 MED ORDER — FENTANYL CITRATE (PF) 100 MCG/2ML IJ SOLN
INTRAMUSCULAR | Status: AC | PRN
  Administered 2019-10-10 (×2): 50 ug via INTRAVENOUS

## 2019-10-10 MED ORDER — MIDAZOLAM HCL 2 MG/2ML IJ SOLN
INTRAMUSCULAR | Status: AC
  Filled 2019-10-10: qty 2

## 2019-10-10 MED ORDER — LIDOCAINE-EPINEPHRINE 1 %-1:100000 IJ SOLN
INTRAMUSCULAR | Status: AC | PRN
  Administered 2019-10-10 (×2): 10 mL via INTRADERMAL

## 2019-10-10 MED ORDER — HEPARIN SOD (PORK) LOCK FLUSH 100 UNIT/ML IV SOLN
INTRAVENOUS | Status: AC
  Filled 2019-10-10: qty 5

## 2019-10-10 MED ORDER — CEFAZOLIN SODIUM-DEXTROSE 2-4 GM/100ML-% IV SOLN: 2.0000 g | INTRAVENOUS | Status: AC

## 2019-10-10 MED ORDER — MIDAZOLAM HCL 2 MG/2ML IJ SOLN
INTRAMUSCULAR | Status: AC | PRN
  Administered 2019-10-10 (×2): 1 mg via INTRAVENOUS

## 2019-10-10 MED ORDER — FENTANYL CITRATE (PF) 100 MCG/2ML IJ SOLN
INTRAMUSCULAR | Status: AC
  Filled 2019-10-10: qty 2

## 2019-10-10 NOTE — H&P (Signed)
Chief Complaint: Patient was seen in consultation today for ovarian cancer/Port-a-cath placement.  Referring Physician(s): [name] Lark  Supervising Physician: Jacqulynn Cadet  Patient Status: [facility] - Out-pt  History of Present Illness: Stacy Rivas is a 64 y.o. female with a past medical history of hyperlipidemia, headaches, hiatal hernia, GERD, IBS, diverticulosis, nephrolithiasis, ovarian cancer, uterine fibroids, and anxiety. She was unfortunately diagnosed with ovarian cancer in 11/2018. Her cancer is managed by Dr. Alvy Bimler. She had a Port-a-cath placed in IR 12/06/2018 by Dr. Anselm Pancoast. She underwent an exploratory laparotomy with total hysterectomy, bilateral salpingo-oophorectomy, and omentectomy radical tumor debulking in OR 02/12/2019 by Dr. Denman George. She completed a course of systemic chemotherapy, following this she had her Port-a-cath removed in IR 06/03/2019 by Dr. Laurence Ferrari. Unfortunately, follow-up imaging revealed new tumor deposits along the liver, spleen, left pelvis, and right paracolic gutter, compatible with metastatic disease. It was recommended by Dr. Alvy Bimler that patient undergo an additional course of systemic chemotherapy for management.  IR requested by Dr. Alvy Bimler for possible image-guided Port-a-cath placement. Patient awake and alert laying in bed with no complaints at this time. Denies fever, chills, chest pain, dyspnea, abdominal pain, or headache.   Past Medical History:  Diagnosis Date  . Anxiety state, unspecified   . Diverticulosis   . Esophageal reflux   . Family history of breast cancer   . Family history of lymphoma   . Family history of stomach cancer   . Family history of uterine cancer   . Fibroid tumor   . Headache    due to allergies   . Hiatal hernia   . Hyperlipidemia   . IBS (irritable bowel syndrome)   . Internal hemorrhoids   . Nonspecific elevation of levels of transaminase or lactic acid dehydrogenase (LDH)   . ovarian ca dx'd 11/2018    . Renal stone 07/2013  . Tubulovillous adenoma of colon   . Varicose veins    superficail thrombophlebitis (left LE)    Past Surgical History:  Procedure Laterality Date  . CATARACT EXTRACTION, BILATERAL    . cystoscopy with instillation for cystitis  2001  . DEBULKING N/A 02/12/2019   Procedure: DEBULKING OF TUMOR;  Surgeon: Everitt Amber, MD;  Location: [facility] ORS;  Service: Gynecology;  Laterality: N/A;  . DILATION AND CURETTAGE OF UTERUS     miscarriage  . HYSTERECTOMY ABDOMINAL WITH SALPINGO-OOPHORECTOMY Bilateral 02/12/2019   Procedure: HYSTERECTOMY ABDOMINAL WITH SALPINGO-OOPHORECTOMY;  Surgeon: Everitt Amber, MD;  Location: [facility] ORS;  Service: Gynecology;  Laterality: Bilateral;  . IR IMAGING GUIDED PORT INSERTION  12/06/2018  . IR PARACENTESIS  12/03/2018  . IR PARACENTESIS  12/14/2018  . IR REMOVAL TUN ACCESS W/ PORT W/O FL MOD SED  06/03/2019  . LAPAROSCOPY     adhesions  . LITHOTRIPSY  07/2013   x 2  . MYOMECTOMY  1992  . OMENTECTOMY N/A 02/12/2019   Procedure: OMENTECTOMY;  Surgeon: Everitt Amber, MD;  Location: [facility] ORS;  Service: Gynecology;  Laterality: N/A;  . PARACENTESIS  11/21/2018   abdominal , removed 3.1 Liters     Allergies: Skin adhesives [cyanoacrylate], Shrimp [shellfish allergy], and Sulfonamide derivatives  Medications: Prior to Admission medications   Medication Sig Start Date End Date Taking? Authorizing Provider  carboxymethylcellulose (REFRESH PLUS) 0.5 % SOLN Place 1 drop into both eyes 2 (two) times daily as needed (dry eyes).    [provider]  Cholecalciferol (VITAMIN D3) 25 MCG (1000 UT) CAPS Take 1,000 Units by mouth daily.  [provider]  diphenhydrAMINE (BENADRYL) 25 mg capsule Take 25 mg by mouth at bedtime as needed for sleep.    [provider]  esomeprazole (NEXIUM) 20 MG capsule Take 20 mg by mouth daily.     [provider]  loratadine (CLARITIN) 10 MG tablet Take 10 mg by mouth daily as needed for allergies.     [provider]  Multiple Vitamin (MULTIVITAMIN WITH MINERALS) TABS tablet Take 1 tablet by mouth daily.    [provider]     Family History  Problem Relation Age of Onset  . Lymphoma Father        dx 30s  . Heart disease Father   . Hypertension Father   . Hypertension Mother   . Hyperlipidemia Mother   . Breast cancer Mother 59  . Diabetes Maternal Grandmother   . Uterine cancer Maternal Grandmother        dx 81s  . Stomach cancer Paternal Grandfather   . Breast cancer Other        grandmother's sisters, both dx 80s  . Colon cancer Neg Hx     Social History   Socioeconomic History  . Marital status: Married    Spouse name: Not on file  . Number of children: 0  . Years of education: Not on file  . Highest education level: Not on file  Occupational History  . Occupation: Press photographer  Tobacco Use  . Smoking status: Former Smoker    Years: 10.00    Types: Cigarettes    Quit date: 05/23/1980    Years since quitting: 39.4  . Smokeless tobacco: Never Used  . Tobacco comment: quit 1984  Substance and Sexual Activity  . Alcohol use: Yes    Alcohol/week: 0.0 standard drinks    Comment: 2/day, 11-21-2018   no alcodholint he last month   . Drug use: No  . Sexual activity: Yes    Partners: Male  Other Topics Concern  . Not on file  Social History Narrative  . Not on file   Social Determinants of Health   Financial Resource Strain:   . Difficulty of Paying Living Expenses:   Food Insecurity:   . Worried About Charity fundraiser in the Last Year:   . Arboriculturist in the Last Year:   Transportation Needs:   . Film/video editor (Medical):   [name] Kitchen Lack of Transportation (Non-Medical):   Physical Activity:   . Days of Exercise per Week:   . Minutes of Exercise per Session:   Stress:   . Feeling of Stress :   Social Connections:   . Frequency of Communication with Friends and Family:   . Frequency of Social Gatherings with Friends and Family:   .  Attends Religious Services:   . Active Member of Clubs or Organizations:   . Attends Archivist Meetings:   [name] Kitchen Marital Status:      Review of Systems: A 12 point ROS discussed and pertinent positives are indicated in the HPI above.  All other systems are negative.  Review of Systems  Constitutional: Negative for chills and fever.  Respiratory: Negative for shortness of breath and wheezing.   Cardiovascular: Negative for chest pain and palpitations.  Gastrointestinal: Negative for abdominal pain.  Neurological: Negative for headaches.  Psychiatric/Behavioral: Negative for behavioral problems and confusion.    Vital Signs: BP (!) 155/91 (BP Location: Right Arm)   Pulse 81   Temp 98.3 F (36.8 C) (Oral)  Resp 18   SpO2 100%   Physical Exam Vitals and nursing note reviewed.  Constitutional:      General: She is not in acute distress.    Appearance: Normal appearance.  Cardiovascular:     Rate and Rhythm: Normal rate and regular rhythm.     Heart sounds: Normal heart sounds. No murmur.  Pulmonary:     Effort: Pulmonary effort is normal. No respiratory distress.     Breath sounds: Normal breath sounds. No wheezing.  Skin:    General: Skin is warm and dry.  Neurological:     Mental Status: She is alert and oriented to person, place, and time.      MD Evaluation Airway: WNL Heart: WNL Abdomen: WNL Chest/ Lungs: WNL ASA  Classification: 3 Mallampati/Airway Score: Two   Imaging: CT Abdomen Pelvis W Contrast  Result Date: 10/03/2019 CLINICAL DATA:  Surveillance of left ovarian cancer. Prior debulking surgery and chemotherapy. Intermittent right upper quadrant abdominal pain. EXAM: CT ABDOMEN AND PELVIS WITH CONTRAST TECHNIQUE: Multidetector CT imaging of the abdomen and pelvis was performed using the standard protocol following bolus administration of intravenous contrast. CONTRAST:  166mL OMNIPAQUE IOHEXOL 300 MG/ML  SOLN COMPARISON:  05/22/2019 FINDINGS:  Lower chest: Mild cardiomegaly. Mild scarring in the left lower lobe and lingula. Hepatobiliary: New low-density tumor deposit or lymph node in the porta hepatis along the posterior margin of the falciform ligament and anterior to the caudate lobe, measuring 1.0 by 1.7 by 1.6 cm on image 15/2. The gallbladder appears unremarkable. New tumor deposit along the medial capsular margin of the right hepatic lobe measuring 3.6 by 1.5 by 3.0 cm on image 45/5. Pancreas: Mild dilation of the dorsal pancreatic duct in pancreatic body and head, cause uncertain. Otherwise unremarkable. Spleen: New 2.8 by 1.2 by 1.5 cm tumor deposit along the inferomedial splenic capsule as shown on image 23/5. Adrenals/Urinary Tract: 4 clustered calculi in the left kidney upper pole, largest 0.8 cm in long axis on image 31/5. The left ureter is near some of the tumor at the level of the iliac vessel cross over, but is not dilated. Stomach/Bowel: Unremarkable Vascular/Lymphatic: Aortoiliac atherosclerotic vascular disease. Reproductive: Uterus absent. Prior oophorectomy. Other: New tumor nodularity in the right paracolic gutter including a 0.9 by 0.6 cm nodule on image 54/2. Anteromedial to the left psoas muscle and iliac bifurcation, a new 3.7 by 2.4 by 5.7 cm cystic and solid mass is present compatible malignancy. Just below this there is a smaller fluid density structure measuring 1.4 by 0.6 cm on image 66/2. Trace free pelvic fluid. Musculoskeletal: Unremarkable IMPRESSION: 1. New tumor deposits along the capsular surfaces of the liver, spleen, in the left pelvis, and right paracolic gutter, compatible with metastatic disease. 2. Mild dilation of the dorsal pancreatic duct in the pancreatic body and head, cause uncertain. 3. Nonobstructive left nephrolithiasis. 4. Aortic atherosclerosis. Aortic Atherosclerosis (ICD10-I70.0). Electronically Signed   By: Van Clines M.D.   On: 10/03/2019 14:56    Labs:  CBC: Recent Labs     05/22/19 0828 06/03/19 0835 08/26/19 1352 10/10/19 1308  WBC 4.6 4.4 7.4 5.8  HGB 11.8* 13.0 14.3 13.9  HCT 35.3* 39.4 42.6 40.2  PLT 216 252 232 240    COAGS: Recent Labs    12/06/18 0855 06/03/19 0835  INR 1.0 0.9    BMP: Recent Labs    04/01/19 0815 04/22/19 0915 05/22/19 0828 08/26/19 1352  NA 142 142 143 144  K 3.7 3.9 3.8 3.8  CL 107 107 107 105  CO2 22 23 26 28   GLUCOSE 243* 137* 95 90  BUN 12 13 11 17   CALCIUM 9.4 9.6 9.1 9.6  CREATININE 0.71 0.64 0.60 0.68  GFRNONAA >60 >60 >60 >60  GFRAA >60 >60 >60 >60    LIVER FUNCTION TESTS: Recent Labs    04/01/19 0815 04/22/19 0915 05/22/19 0828 08/26/19 1352  BILITOT 0.6 0.7 1.3* 1.4*  AST 40 38 46* 31  ALT 60* 57* 60* 37  ALKPHOS 197* 151* 162* 101  PROT 7.0 7.0 6.5 7.0  ALBUMIN 4.1 4.1 4.0 4.1     Assessment and Plan:  History of ovarian cancer s/p systemic chemotherapy along with total hysterectomy/bilateral salpingo-oophorectomy/omentectomy radical tumor debulking 9/22/202; with evidence of metastatic disease on imaging, with tentative plans to begin systemic chemotherapy as management. Plan for image-guided Port-a-cath placement today in IR. Patient is NPO. Afebrile.  Did have questions regarding recent Port-a-cath removal in IR 06/03/2019- states she had an allergic reaction to something that was used during procedure (per Dr. Alvy Bimler notes suture vs dermabond?), however no issues with Port-a-cath placement in IR 12/06/2018. Will make Dr. Laurence Ferrari aware.  Risks and benefits of image-guided Port-a-catheter placement were discussed with the patient including, but not limited to bleeding, infection, pneumothorax, or fibrin sheath development and need for additional procedures. All of the patient's questions were answered, patient is agreeable to proceed. Consent signed and in chart.   Thank you for this interesting consult.  I greatly enjoyed meeting MERSAYDES OBST and look forward to  participating in their care.  A copy of this report was sent to the requesting provider on this date.  Electronically Signed: Earley Abide, PA-C 10/10/2019, 1:42 PM   I spent a total of 25 Minutes in face to face in clinical consultation, greater than 50% of which was counseling/coordinating care for ovarian cancer/Port-a-cath placement.

## 2019-10-10 NOTE — Procedures (Signed)
Interventional Radiology Procedure Note  Procedure: Placement of a right IJ approach single lumen PowerPort.  Tip is positioned at the superior cavoatrial junction and catheter is ready for immediate use.  Complications: No immediate Recommendations:  - Ok to shower tomorrow - Do not submerge for 7 days - Routine line care   Signed,  Shantal Roan K. Maylen Waltermire, MD   

## 2019-10-10 NOTE — Discharge Instructions (Addendum)

## 2019-10-11 ENCOUNTER — Other Ambulatory Visit: Payer: Self-pay | Admitting: Hematology and Oncology

## 2019-10-11 ENCOUNTER — Telehealth: Payer: Self-pay

## 2019-10-11 ENCOUNTER — Other Ambulatory Visit: Payer: Self-pay

## 2019-10-11 ENCOUNTER — Inpatient Hospital Stay: Payer: Managed Care, Other (non HMO)

## 2019-10-11 DIAGNOSIS — Z7189 Other specified counseling: Secondary | ICD-10-CM

## 2019-10-11 DIAGNOSIS — C569 Malignant neoplasm of unspecified ovary: Secondary | ICD-10-CM

## 2019-10-11 DIAGNOSIS — C562 Malignant neoplasm of left ovary: Secondary | ICD-10-CM | POA: Diagnosis not present

## 2019-10-11 LAB — CMP (CANCER CENTER ONLY)
ALT: 19 U/L (ref 0–44)
AST: 18 U/L (ref 15–41)
Albumin: 4 g/dL (ref 3.5–5.0)
Alkaline Phosphatase: 76 U/L (ref 38–126)
Anion gap: 9 (ref 5–15)
BUN: 9 mg/dL (ref 8–23)
CO2: 27 mmol/L (ref 22–32)
Calcium: 9.4 mg/dL (ref 8.9–10.3)
Chloride: 106 mmol/L (ref 98–111)
Creatinine: 0.67 mg/dL (ref 0.44–1.00)
GFR, Est AFR Am: >60 mL/min (ref >60)
GFR, Estimated: >60 mL/min (ref >60)
Glucose, Bld: 106 mg/dL — ABNORMAL HIGH (ref 70–99)
Potassium: 3.8 mmol/L (ref 3.5–5.1)
Sodium: 142 mmol/L (ref 135–145)
Total Bilirubin: 2.3 mg/dL — ABNORMAL HIGH (ref 0.3–1.2)
Total Protein: 6.6 g/dL (ref 6.5–8.1)

## 2019-10-11 NOTE — Telephone Encounter (Signed)
-----   Message from [name] Lark, MD sent at 10/11/2019  8:00 AM EDT ----- Regarding: CMP today I was hoping IR would draw CBC and CMP Only CBC is done Can you get her to come in for CMP and CA-125 today?

## 2019-10-11 NOTE — Telephone Encounter (Signed)
Called and given below message. Appt scheduled for 1130 today for lab.

## 2019-10-12 LAB — CA 125: Cancer Antigen (CA) 125: 19.6 U/mL (ref 0.0–38.1)

## 2019-10-14 ENCOUNTER — Telehealth: Payer: Self-pay

## 2019-10-14 ENCOUNTER — Other Ambulatory Visit: Payer: Self-pay

## 2019-10-14 ENCOUNTER — Encounter: Payer: Self-pay | Admitting: Hematology and Oncology

## 2019-10-14 ENCOUNTER — Inpatient Hospital Stay: Payer: Managed Care, Other (non HMO)

## 2019-10-14 ENCOUNTER — Inpatient Hospital Stay (HOSPITAL_BASED_OUTPATIENT_CLINIC_OR_DEPARTMENT_OTHER): Payer: Managed Care, Other (non HMO) | Admitting: Hematology and Oncology

## 2019-10-14 DIAGNOSIS — R21 Rash and other nonspecific skin eruption: Secondary | ICD-10-CM | POA: Diagnosis not present

## 2019-10-14 DIAGNOSIS — C562 Malignant neoplasm of left ovary: Secondary | ICD-10-CM | POA: Diagnosis not present

## 2019-10-14 DIAGNOSIS — R748 Abnormal levels of other serum enzymes: Secondary | ICD-10-CM

## 2019-10-14 DIAGNOSIS — Z7189 Other specified counseling: Secondary | ICD-10-CM

## 2019-10-14 MED ORDER — PALONOSETRON HCL INJECTION 0.25 MG/5ML: 0.2500 mg | Freq: Once | INTRAVENOUS | Status: DC

## 2019-10-14 MED ORDER — DIPHENHYDRAMINE HCL 25 MG PO CAPS
ORAL_CAPSULE | ORAL | Status: AC
  Filled 2019-10-14: qty 1

## 2019-10-14 MED ORDER — SODIUM CHLORIDE 0.9 % IV SOLN
150.0000 mg | Freq: Once | INTRAVENOUS | Status: AC
  Administered 2019-10-14: 150 mg via INTRAVENOUS
  Filled 2019-10-14: qty 150

## 2019-10-14 MED ORDER — PALONOSETRON HCL INJECTION 0.25 MG/5ML
INTRAVENOUS | Status: AC
  Filled 2019-10-14: qty 5

## 2019-10-14 MED ORDER — SODIUM CHLORIDE 0.9% FLUSH
10.0000 mL | INTRAVENOUS | Status: DC | PRN
  Administered 2019-10-14: 10 mL
  Filled 2019-10-14: qty 10

## 2019-10-14 MED ORDER — FAMOTIDINE IN NACL 20-0.9 MG/50ML-% IV SOLN
INTRAVENOUS | Status: AC
  Filled 2019-10-14: qty 50

## 2019-10-14 MED ORDER — FAMOTIDINE IN NACL 20-0.9 MG/50ML-% IV SOLN
20.0000 mg | Freq: Once | INTRAVENOUS | Status: AC
  Administered 2019-10-14: 20 mg via INTRAVENOUS

## 2019-10-14 MED ORDER — HEPARIN SOD (PORK) LOCK FLUSH 100 UNIT/ML IV SOLN
500.0000 [IU] | Freq: Once | INTRAVENOUS | Status: AC | PRN
  Administered 2019-10-14: 500 [IU]
  Filled 2019-10-14: qty 5

## 2019-10-14 MED ORDER — SODIUM CHLORIDE 0.9 % IV SOLN
600.0000 mg/m2 | Freq: Once | INTRAVENOUS | Status: AC
  Administered 2019-10-14: 1026 mg via INTRAVENOUS
  Filled 2019-10-14: qty 26.98

## 2019-10-14 MED ORDER — DIPHENHYDRAMINE HCL 25 MG PO TABS
25.0000 mg | ORAL_TABLET | Freq: Once | ORAL | Status: AC
  Administered 2019-10-14: 25 mg via ORAL
  Filled 2019-10-14: qty 1

## 2019-10-14 MED ORDER — SODIUM CHLORIDE 0.9 % IV SOLN
332.4000 mg | Freq: Once | INTRAVENOUS | Status: AC
  Administered 2019-10-14: 330 mg via INTRAVENOUS
  Filled 2019-10-14: qty 33

## 2019-10-14 MED ORDER — SODIUM CHLORIDE 0.9 % IV SOLN
Freq: Once | INTRAVENOUS | Status: AC
  Filled 2019-10-14: qty 250

## 2019-10-14 MED ORDER — SODIUM CHLORIDE 0.9 % IV SOLN
10.0000 mg | Freq: Once | INTRAVENOUS | Status: AC
  Administered 2019-10-14: 10 mg via INTRAVENOUS
  Filled 2019-10-14: qty 10

## 2019-10-14 MED ORDER — PALONOSETRON HCL INJECTION 0.25 MG/5ML
0.2500 mg | Freq: Once | INTRAVENOUS | Status: AC
  Administered 2019-10-14: 0.25 mg via INTRAVENOUS

## 2019-10-14 NOTE — Assessment & Plan Note (Signed)
She has localized skin rash over the port insertion site, likely reaction to the glue used after the procedure We will enter that as her allergy I recommend over-the-counter Benadryl as needed

## 2019-10-14 NOTE — Patient Instructions (Signed)
[facility] Discharge Instructions for Patients Receiving Chemotherapy  Today you received the following chemotherapy agents carboplatin/gemcitabine   To help prevent nausea and vomiting after your treatment, we encourage you to take your nausea medication as directed.    If you develop nausea and vomiting that is not controlled by your nausea medication, call the clinic.   BELOW ARE SYMPTOMS THAT SHOULD BE REPORTED IMMEDIATELY:  *FEVER GREATER THAN 100.5 F  *CHILLS WITH OR WITHOUT FEVER  NAUSEA AND VOMITING THAT IS NOT CONTROLLED WITH YOUR NAUSEA MEDICATION  *UNUSUAL SHORTNESS OF BREATH  *UNUSUAL BRUISING OR BLEEDING  TENDERNESS IN MOUTH AND THROAT WITH OR WITHOUT PRESENCE OF ULCERS  *URINARY PROBLEMS  *BOWEL PROBLEMS  UNUSUAL RASH Items with * indicate a potential emergency and should be followed up as soon as possible.  Feel free to call the clinic you have any questions or concerns. The clinic phone number is [phone].  Gemcitabine injection What is this medicine? GEMCITABINE (jem SYE ta been) is a chemotherapy drug. This medicine is used to treat many types of cancer like breast cancer, lung cancer, pancreatic cancer, and ovarian cancer. This medicine may be used for other purposes; ask your health care provider or pharmacist if you have questions. COMMON BRAND NAME(S): Gemzar, Infugem What should I tell my health care provider before I take this medicine? They need to know if you have any of these conditions:  blood disorders  infection  kidney disease  liver disease  lung or breathing disease, like asthma  recent or ongoing radiation therapy  an unusual or allergic reaction to gemcitabine, other chemotherapy, other medicines, foods, dyes, or preservatives  pregnant or trying to get pregnant  breast-feeding How should I use this medicine? This drug is given as an infusion into a vein. It is administered in a hospital or clinic by a  specially trained health care professional. Talk to your pediatrician regarding the use of this medicine in children. Special care may be needed. Overdosage: If you think you have taken too much of this medicine contact a poison control center or emergency room at once. NOTE: This medicine is only for you. Do not share this medicine with others. What if I miss a dose? It is important not to miss your dose. Call your doctor or health care professional if you are unable to keep an appointment. What may interact with this medicine?  medicines to increase blood counts like filgrastim, pegfilgrastim, sargramostim  some other chemotherapy drugs like cisplatin  vaccines Talk to your doctor or health care professional before taking any of these medicines:  acetaminophen  aspirin  ibuprofen  ketoprofen  naproxen This list may not describe all possible interactions. Give your health care provider a list of all the medicines, herbs, non-prescription drugs, or dietary supplements you use. Also tell them if you smoke, drink alcohol, or use illegal drugs. Some items may interact with your medicine. What should I watch for while using this medicine? Visit your doctor for checks on your progress. This drug may make you feel generally unwell. This is not uncommon, as chemotherapy can affect healthy cells as well as cancer cells. Report any side effects. Continue your course of treatment even though you feel ill unless your doctor tells you to stop. In some cases, you may be given additional medicines to help with side effects. Follow all directions for their use. Call your doctor or health care professional for advice if you get a fever, chills or  sore throat, or other symptoms of a cold or flu. Do not treat yourself. This drug decreases your body's ability to fight infections. Try to avoid being around people who are sick. This medicine may increase your risk to bruise or bleed. Call your doctor or  health care professional if you notice any unusual bleeding. Be careful brushing and flossing your teeth or using a toothpick because you may get an infection or bleed more easily. If you have any dental work done, tell your dentist you are receiving this medicine. Avoid taking products that contain aspirin, acetaminophen, ibuprofen, naproxen, or ketoprofen unless instructed by your doctor. These medicines may hide a fever. Do not become pregnant while taking this medicine or for 6 months after stopping it. Women should inform their doctor if they wish to become pregnant or think they might be pregnant. Men should not father a child while taking this medicine and for 3 months after stopping it. There is a potential for serious side effects to an unborn child. Talk to your health care professional or pharmacist for more information. Do not breast-feed an infant while taking this medicine or for at least 1 week after stopping it. Men should inform their doctors if they wish to father a child. This medicine may lower sperm counts. Talk with your doctor or health care professional if you are concerned about your fertility. What side effects may I notice from receiving this medicine? Side effects that you should report to your doctor or health care professional as soon as possible:  allergic reactions like skin rash, itching or hives, swelling of the face, lips, or tongue  breathing problems  pain, redness, or irritation at site where injected  signs and symptoms of a dangerous change in heartbeat or heart rhythm like chest pain; dizziness; fast or irregular heartbeat; palpitations; feeling faint or lightheaded, falls; breathing problems  signs of decreased platelets or bleeding - bruising, pinpoint red spots on the skin, black, tarry stools, blood in the urine  signs of decreased red blood cells - unusually weak or tired, feeling faint or lightheaded, falls  signs of infection - fever or chills,  cough, sore throat, pain or difficulty passing urine  signs and symptoms of kidney injury like trouble passing urine or change in the amount of urine  signs and symptoms of liver injury like dark yellow or brown urine; general ill feeling or flu-like symptoms; light-colored stools; loss of appetite; nausea; right upper belly pain; unusually weak or tired; yellowing of the eyes or skin  swelling of ankles, feet, hands Side effects that usually do not require medical attention (report to your doctor or health care professional if they continue or are bothersome):  constipation  diarrhea  hair loss  loss of appetite  nausea  rash  vomiting This list may not describe all possible side effects. Call your doctor for medical advice about side effects. You may report side effects to FDA at 1-800-FDA-1088. Where should I keep my medicine? This drug is given in a hospital or clinic and will not be stored at home. NOTE: This sheet is a summary. It may not cover all possible information. If you have questions about this medicine, talk to your doctor, pharmacist, or health care provider.  2020 Elsevier/Gold Standard (2017-08-02 18:06:11)

## 2019-10-14 NOTE — Assessment & Plan Note (Signed)
We reviewed the plan of care again Currently, she is not symptomatic Her CA-125 is mild generally elevated compared to her baseline but still within normal range I plan to see her again next week for further follow-up

## 2019-10-14 NOTE — Assessment & Plan Note (Signed)
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

## 2019-10-14 NOTE — Progress Notes (Signed)
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[facility]) We reviewed the plan of care again Currently, she is not symptomatic Her CA-125 is mild generally elevated compared to her baseline but still within normal range I plan to see her again next week for further follow-up  Skin rash She has localized skin rash over the port insertion site, likely reaction to the glue used after the procedure We will enter that as her allergy I recommend over-the-counter Benadryl as needed  Elevated liver enzymes She has intermittent elevated liver enzymes I plan to reduce the dose of chemotherapy a little bit 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   All questions were answered. The patient knows to call the clinic with any problems, questions or concerns. The total time spent in the appointment was 20 minutes encounter with patients including review of chart and various tests results, discussions about plan of care and coordination of care plan   [name] Lark, MD 10/14/2019 9:14 AM  INTERVAL HISTORY: Please see below for problem oriented charting. 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 14.7 cm poorly defined soft tissue mass in central pelvis suspicious for primary ovarian carcinoma, with uterine leiomyosarcoma considered a less likely differential diagnosis. 2.  Moderate ascites and diffuse peritoneal carcinomatosis. 3. Several small uterine fibroids.   11/13/2018 Tumor Marker   Patient's tumor was tested for the following markers: CA-125 Results of the tumor marker test revealed 1434   11/21/2018 Procedure   Successful ultrasound-guided diagnostic and therapeutic paracentesis yielding 3.1 liters of peritoneal fluid   11/21/2018 Pathology Results   PERITONEAL/ASCITIC FLUID(SPECIMEN 1 OF 1 COLLECTED 11/21/18): ADENOCARCINOMA. Specimen Clinical Information Pelvic mass suspicious for ovarian cancer Source Peritoneal/Ascitic Fluid, (specimen 1 of 1 collected 11/21/18) Gross Specimen: Received is/are 1000 ccs of dark amber fluid. (CM:cm) Prepared: # Smears: 0 # Concentration Technique Slides (i.e. 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   11/29/2018 Initial Diagnosis   Ovarian cancer ([place])   11/29/2018 Cancer Staging   Staging form: Ovary, Fallopian Tube, and Primary Peritoneal Carcinoma, AJCC 8th Edition - Clinical: cT3, cN0, cM0 - Signed by [name] Lark, MD on 11/29/2018   12/03/2018 Procedure   Successful ultrasound-guided therapeutic paracentesis yielding 3.7 liters of peritoneal fluid.     12/06/2018 Procedure   Placement of a subcutaneous port device. Catheter tip at the SVC and right atrium junction   12/14/2018 Procedure   Successful ultrasound-guided paracentesis yielding 2.9 L of peritoneal fluid   12/28/2018 Tumor Marker   Patient's tumor was tested for the following markers: CA-125 Results of the tumor marker test revealed 812.   12/28/2018 - 04/22/2019 Chemotherapy   The patient had carboplatin and taxol for neoadjuvant treatment, followed by interval debulking surgery and subsequent adjuvant chemotherapy treatment.     02/01/2019 Imaging  Ct abdomen and pelvis 8.6 cm left ovarian mass, corresponding to the patient's known primary neoplasm,  improved.   Mild peritoneal nodularity/omental caking, improved.   Small abdominopelvic ascites, improved.     02/01/2019 Tumor Marker   Patient's tumor was tested for the following markers: CA-125 Results of the tumor marker test revealed 183.   02/12/2019 Surgery   Preoperative Diagnosis: Stage IIIC ovarian cancer, s/p neoadjuvant chemotherapy      Procedure(s) Performed: 1. Exploratory laparotomy with total abdominal hysterectomy, bilateral salpingo-oophorectomy, omentectomy radical tumor debulking for ovarian cancer.   Surgeon: Thereasa Solo, MD.    Operative Findings: upper abdomen free of disease. No visible omental disease. Small volume (200cc) ascites. 8cm friable mass replacing left ovary and adherent to the sigmoid colon mesentery and ureter on the left.  Anterior fibroid. This represented an optimal cytoreduction (R0) with no gross visible disease remaining.    02/12/2019 Pathology Results   A. OVARY AND FALLOPIAN TUBE, LEFT, SALPINGO-OOPHORECTOMY: - Serous carcinoma, high grade, status post neoadjuvant therapy - See oncology table and comment below B. UTERUS CERVIX WITH RIGHT FALLOPIAN TUBE AND OVARY, HYSTERECTOMY: Uterus: - Serosal surface involved by serous carcinoma - Endomyometrium uninvolved by carcinoma - Benign endometrial polyp (4.1 cm) - Leiomyomata (5.5 cm; largest) - Adenomyosis Cervix: - Uninvolved by carcinoma Left ovary: - Serous carcinoma, high grade Left fallopian tube: - Serous carcinoma, high grade C. SOFT TISSUE, LEFT PELVIC SIDEWALL TUMOR, EXCISION: - Metastatic serous carcinoma, high-grade D. SOFT TISSUE, SIGMOID COLON MESENTERY, EXCISION: - Metastatic serous carcinoma, high-grade E. OMENTUM, TUMOR RESECTION: - Metastatic serous carcinoma, high-grade OVARY or FALLOPIAN TUBE or PRIMARY PERITONEUM: Procedure: Total hysterectomy and bilateral salpingo-oophorectomy, omentectomy and peritoneal biopsies Specimen Integrity: Fragmented Tumor Site:  Left ovary and fallopian tube Ovarian Surface Involvement: Present Fallopian Tube Surface Involvement: Present Tumor Size: 6.3 cm (see comment) Histologic Type: Serous carcinoma Histologic Grade: High-grade Implants: Not applicable Other Tissue/ Organ Involvement: Right ovary, right fallopian tube, omentum, mesentery Largest Extrapelvic Peritoneal Focus: Macroscopic Peritoneal/Ascitic Fluid: Malignant Treatment Effect: No definite or minimal response identified (chemotherapy response score 1 [CRS1] Regional Lymph Nodes: No lymph nodes submitted or found Pathologic Stage Classification (pTNM, AJCC 8th Edition): pT3c, pNX Representative Tumor Block: A4 Comment(s): Additional testing (HER-2, MMR and MSI) are pending. The primary tumor site appears to be the left ovary and fallopian tube. The uterus is only involved on the serosal surface. There is tumor on the anterior peritoneal reflection.  Addendum: Tumor is Her2 negative,MSI stable   02/19/2019 Tumor Marker   Patient's tumor was tested for the following markers: CA-125 Results of the tumor marker test revealed 40.3   04/01/2019 Tumor Marker   Patient's tumor was tested for the following markers: CA-125 Results of the tumor marker test revealed 8.1    Genetic Testing   Negative testing. No pathogenic variants identified on the [employer]. The report date is 04/17/2019.  Somatic genes analyzed through [facility]: ATM, BARD1, BRCA1, BRCA2, BRIP1, CHEK2, MRE11A, NBN, PALB2, RAD51C, RAD51D.  The CancerNext gene panel offered by [employer] includes sequencing and rearrangement analysis for the following 36 genes: APC*, ATM*, AXIN2, BARD1, BMPR1A, BRCA1*, BRCA2*, BRIP1*, CDH1*, CDK4, CDKN2A, CHEK2*, DICER1, MLH1*, MSH2*, MSH3, MSH6*, MUTYH*, NBN, NF1*, NTHL1, PALB2*, PMS2*, PTEN*, RAD51C*, RAD51D*, RECQL, SMAD4, SMARCA4, STK11 and TP53* (sequencing and deletion/duplication); HOXB13, POLD1 and POLE (sequencing  only); EPCAM and GREM1 (deletion/duplication only).    05/21/2019 Imaging   1. Interval hysterectomy, oophorectomy and omentectomy. No evidence of residual ovarian carcinoma  in the pelvis. No evidence of peritoneal disease. No intraperitoneal free fluid. 2. Nonobstructing LEFT renal calculi.  Normal ureters.   05/22/2019 Tumor Marker   Patient's tumor was tested for the following markers: CA-125 Results of the tumor marker test revealed 5.7.   06/03/2019 Procedure   Successful right IJ vein Port-A-Cath explant.   08/26/2019 Tumor Marker   Patient's tumor was tested for the following markers: CA-125. Results of the tumor marker test revealed 4.9   10/03/2019 Imaging   1. New tumor deposits along the capsular surfaces of the liver, spleen, in the left pelvis, and right paracolic gutter, compatible with metastatic disease. 2. Mild dilation of the dorsal pancreatic duct in the pancreatic body and head, cause uncertain. 3. Nonobstructive left nephrolithiasis. 4. Aortic atherosclerosis.   Aortic Atherosclerosis (ICD10-I70.0   10/10/2019 Procedure   Successful placement of a right IJ approach Power Port with ultrasound and fluoroscopic guidance. The catheter is ready for use.     10/11/2019 Tumor Marker   Patient's tumor was tested for the following markers: CA-125 Results of the tumor marker test revealed 19.6.   10/14/2019 -  Chemotherapy   The patient had carboplatin and taxol for chemotherapy treatment.       REVIEW OF SYSTEMS:   Constitutional: Denies fevers, chills or abnormal weight loss Eyes: Denies blurriness of vision Ears, nose, mouth, throat, and face: Denies mucositis or sore throat Respiratory: Denies cough, dyspnea or wheezes Cardiovascular: Denies palpitation, chest discomfort or lower extremity swelling Gastrointestinal:  Denies nausea, heartburn or change in bowel habits Lymphatics: Denies new lymphadenopathy or easy bruising Neurological:Denies numbness, tingling  or new weaknesses Behavioral/Psych: Mood is stable, no new changes  All other systems were reviewed with the patient and are negative.  I have reviewed the past medical history, past surgical history, social history and family history with the patient and they are unchanged from previous note.  ALLERGIES:  is allergic to skin adhesives [cyanoacrylate]; shrimp [shellfish allergy]; and sulfonamide derivatives.  MEDICATIONS:  Current Outpatient Medications  Medication Sig Dispense Refill  . lidocaine-prilocaine (EMLA) cream Apply 1 application topically as needed.    . ondansetron (ZOFRAN) 8 MG tablet Take 8 mg by mouth every 8 (eight) hours as needed.    . prochlorperazine (COMPAZINE) 10 MG tablet Take 10 mg by mouth every 6 (six) hours as needed.    . ALPRAZolam (XANAX) 0.25 MG tablet Take 0.25 mg by mouth at bedtime as needed for anxiety.    . carboxymethylcellulose (REFRESH PLUS) 0.5 % SOLN Place 1 drop into both eyes 2 (two) times daily as needed (dry eyes).    . Cholecalciferol (VITAMIN D3) 25 MCG (1000 UT) CAPS Take 1,000 Units by mouth daily.     . diphenhydrAMINE (BENADRYL) 25 mg capsule Take 25 mg by mouth at bedtime as needed for sleep.    [name] Kitchen esomeprazole (NEXIUM) 20 MG capsule Take 20 mg by mouth daily.     [name] Kitchen loratadine (CLARITIN) 10 MG tablet Take 10 mg by mouth daily as needed for allergies.    . Multiple Vitamin (MULTIVITAMIN WITH MINERALS) TABS tablet Take 1 tablet by mouth daily.     No current facility-administered medications for this visit.   Facility-Administered Medications Ordered in Other Visits  Medication Dose Route Frequency Provider Last Rate Last Admin  . CARBOplatin (PARAPLATIN) 330 mg in sodium chloride 0.9 % 250 mL chemo infusion  330 mg Intravenous Once Alvy Bimler, Ni, MD      . famotidine (PEPCID) IVPB 20  mg premix  20 mg Intravenous Once Alvy Bimler, Ni, MD      . fosaprepitant (EMEND) 150 mg in sodium chloride 0.9 % 145 mL IVPB  150 mg Intravenous Once Alvy Bimler,  Ni, MD      . gemcitabine (GEMZAR) 1,026 mg in sodium chloride 0.9 % 250 mL chemo infusion  600 mg/m2 (Treatment Plan Recorded) Intravenous Once Alvy Bimler, Ni, MD      . heparin lock flush 100 unit/mL  500 Units Intracatheter Once PRN Alvy Bimler, Ni, MD      . palonosetron (ALOXI) injection 0.25 mg  0.25 mg Intravenous Once Gorsuch, Ni, MD      . sodium chloride flush (NS) 0.9 % injection 10 mL  10 mL Intracatheter PRN Alvy Bimler, Ni, MD        PHYSICAL EXAMINATION: ECOG PERFORMANCE STATUS: 1 - Symptomatic but completely ambulatory  Vitals:   10/14/19 0802  BP: (!) 141/81  Pulse: 84  Resp: 18  Temp: 99.1 F (37.3 C)  SpO2: 100%   Filed Weights   10/14/19 0802  Weight: 141 lb 14.4 oz (64.4 kg)    GENERAL:alert, no distress and comfortable SKIN: Noted skin erythematous changes over the port insertion site  NEURO: alert & oriented x 3 with fluent speech, no focal motor/sensory deficits  LABORATORY DATA:  I have reviewed the data as listed    Component Value Date/Time   NA 142 10/11/2019 1136   K 3.8 10/11/2019 1136   CL 106 10/11/2019 1136   CO2 27 10/11/2019 1136   GLUCOSE 106 (H) 10/11/2019 1136   BUN 9 10/11/2019 1136   CREATININE 0.67 10/11/2019 1136   CALCIUM 9.4 10/11/2019 1136   PROT 6.6 10/11/2019 1136   ALBUMIN 4.0 10/11/2019 1136   AST 18 10/11/2019 1136   ALT 19 10/11/2019 1136   ALKPHOS 76 10/11/2019 1136   BILITOT 2.3 (H) 10/11/2019 1136   GFRNONAA >60 10/11/2019 1136   GFRAA >60 10/11/2019 1136    No results found for: SPEP, UPEP  Lab Results  Component Value Date   WBC 5.8 10/10/2019   NEUTROABS 4.0 10/10/2019   HGB 13.9 10/10/2019   HCT 40.2 10/10/2019   MCV 95.5 10/10/2019   PLT 240 10/10/2019      Chemistry      Component Value Date/Time   NA 142 10/11/2019 1136   K 3.8 10/11/2019 1136   CL 106 10/11/2019 1136   CO2 27 10/11/2019 1136   BUN 9 10/11/2019 1136   CREATININE 0.67 10/11/2019 1136      Component Value Date/Time   CALCIUM 9.4  10/11/2019 1136   ALKPHOS 76 10/11/2019 1136   AST 18 10/11/2019 1136   ALT 19 10/11/2019 1136   BILITOT 2.3 (H) 10/11/2019 1136       RADIOGRAPHIC STUDIES: I have personally reviewed the radiological images as listed and agreed with the findings in the report. CT Abdomen Pelvis W Contrast  Result Date: 10/03/2019 CLINICAL DATA:  Surveillance of left ovarian cancer. Prior debulking surgery and chemotherapy. Intermittent right upper quadrant abdominal pain. EXAM: CT ABDOMEN AND PELVIS WITH CONTRAST TECHNIQUE: Multidetector CT imaging of the abdomen and pelvis was performed using the standard protocol following bolus administration of intravenous contrast. CONTRAST:  167m OMNIPAQUE IOHEXOL 300 MG/ML  SOLN COMPARISON:  05/22/2019 FINDINGS: Lower chest: Mild cardiomegaly. Mild scarring in the left lower lobe and lingula. Hepatobiliary: New low-density tumor deposit or lymph node in the porta hepatis along the posterior margin of the falciform ligament and anterior  to the caudate lobe, measuring 1.0 by 1.7 by 1.6 cm on image 15/2. The gallbladder appears unremarkable. New tumor deposit along the medial capsular margin of the right hepatic lobe measuring 3.6 by 1.5 by 3.0 cm on image 45/5. Pancreas: Mild dilation of the dorsal pancreatic duct in pancreatic body and head, cause uncertain. Otherwise unremarkable. Spleen: New 2.8 by 1.2 by 1.5 cm tumor deposit along the inferomedial splenic capsule as shown on image 23/5. Adrenals/Urinary Tract: 4 clustered calculi in the left kidney upper pole, largest 0.8 cm in long axis on image 31/5. The left ureter is near some of the tumor at the level of the iliac vessel cross over, but is not dilated. Stomach/Bowel: Unremarkable Vascular/Lymphatic: Aortoiliac atherosclerotic vascular disease. Reproductive: Uterus absent. Prior oophorectomy. Other: New tumor nodularity in the right paracolic gutter including a 0.9 by 0.6 cm nodule on image 54/2. Anteromedial to the left  psoas muscle and iliac bifurcation, a new 3.7 by 2.4 by 5.7 cm cystic and solid mass is present compatible malignancy. Just below this there is a smaller fluid density structure measuring 1.4 by 0.6 cm on image 66/2. Trace free pelvic fluid. Musculoskeletal: Unremarkable IMPRESSION: 1. New tumor deposits along the capsular surfaces of the liver, spleen, in the left pelvis, and right paracolic gutter, compatible with metastatic disease. 2. Mild dilation of the dorsal pancreatic duct in the pancreatic body and head, cause uncertain. 3. Nonobstructive left nephrolithiasis. 4. Aortic atherosclerosis. Aortic Atherosclerosis (ICD10-I70.0). Electronically Signed   By: Van Clines M.D.   On: 10/03/2019 14:56   IR IMAGING GUIDED PORT INSERTION  Result Date: 10/10/2019 INDICATION: 64 year old female with a history of recurrent left ovarian epithelial cancer. She presents for placement of a port catheter for durable venous access. EXAM: IMPLANTED PORT A CATH PLACEMENT WITH ULTRASOUND AND FLUOROSCOPIC GUIDANCE MEDICATIONS: 2 g Ancef; The antibiotic was administered within an appropriate time interval prior to skin puncture. ANESTHESIA/SEDATION: Versed 2 mg IV; Fentanyl 100 mcg IV; Moderate Sedation Time:  17 minutes The patient was continuously monitored during the procedure by the interventional radiology nurse under my direct supervision. FLUOROSCOPY TIME:  0 minutes, 12 seconds (1 mGy) COMPLICATIONS: None immediate. PROCEDURE: The right neck and chest was prepped with chlorhexidine, and draped in the usual sterile fashion using maximum barrier technique (cap and mask, sterile gown, sterile gloves, large sterile sheet, hand hygiene and cutaneous antiseptic). Local anesthesia was attained by infiltration with 1% lidocaine with epinephrine. Ultrasound demonstrated patency of the right internal jugular vein, and this was documented with an image. Under real-time ultrasound guidance, this vein was accessed with a 21  gauge micropuncture needle and image documentation was performed. A small dermatotomy was made at the access site with an 11 scalpel. A 0.018" wire was advanced into the SVC and the access needle exchanged for a 13F micropuncture vascular sheath. The 0.018" wire was then removed and a 0.035" wire advanced into the IVC. An appropriate location for the subcutaneous reservoir was selected below the clavicle and an incision was made through the skin and underlying soft tissues. The subcutaneous tissues were then dissected using a combination of blunt and sharp surgical technique and a pocket was formed. A single lumen power injectable portacatheter was then tunneled through the subcutaneous tissues from the pocket to the dermatotomy and the port reservoir placed within the subcutaneous pocket. The venous access site was then serially dilated and a peel away vascular sheath placed over the wire. The wire was removed and the port catheter  advanced into position under fluoroscopic guidance. The catheter tip is positioned in the superior cavoatrial junction. This was documented with a spot image. The portacatheter was then tested and found to flush and aspirate well. The port was flushed with saline followed by 100 units/mL heparinized saline. The pocket was then closed in two layers using first subdermal inverted interrupted absorbable sutures followed by a running subcuticular suture. The epidermis was then sealed with Dermabond. The dermatotomy at the venous access site was also closed with Dermabond. IMPRESSION: Successful placement of a right IJ approach Power Port with ultrasound and fluoroscopic guidance. The catheter is ready for use. Electronically Signed   By: Jacqulynn Cadet M.D.   On: 10/10/2019 16:33

## 2019-10-15 ENCOUNTER — Telehealth: Payer: Self-pay | Admitting: *Deleted

## 2019-10-15 ENCOUNTER — Telehealth: Payer: Self-pay | Admitting: Emergency Medicine

## 2019-10-15 NOTE — Telephone Encounter (Signed)
Chemo f/u call, spoke with patient.  Denies any side effects/symptoms from chemo infusion.  Reports continued redness & itchy rash around chest port insertion area since yesterday's visit.  Pt requesting either oral steroids or a cream to relieve the issue, has had a hx of allergic reaction to skin adhesives in the past.  MD Gorsuch's office made aware to f/u today on treating the port irritation.  Pt denies any further questions/concerns at this time.

## 2019-10-15 NOTE — Telephone Encounter (Signed)
When I saw her yesterday I recommend benadryl +/- claritin as needed If it causes too much discomfort, we can call in low dose prednisone 20 mg daily Po x 7 days Please call it in if she wants to try prednisone. Advise her to take daily in the morning with food

## 2019-10-15 NOTE — Progress Notes (Signed)
Pharmacist Chemotherapy Monitoring - Follow Up Assessment    I verify that I have reviewed each item in the below checklist:   Regimen for the patient is scheduled for the appropriate day and plan matches scheduled date.  Appropriate non-routine labs are ordered dependent on drug ordered.  If applicable, additional medications reviewed and ordered per protocol based on lifetime cumulative doses and/or treatment regimen.   Plan for follow-up and/or issues identified: No  I-vent associated with next due treatment: No  MD and/or nursing notified: No  Mimi Debellis D 10/15/2019 4:13 PM

## 2019-10-15 NOTE — Telephone Encounter (Signed)
Called pt regarding rash and irritation to port site. Per Dr.Gorsuch, recommends benadryl + claritin as needed. If symptoms worsened, advised pt to call office and prescription for Prednisone 20 mg x 7 days can be called in. Voice message was left on pt personal cell. Advised to call office with any other questions or concerns

## 2019-10-15 NOTE — Telephone Encounter (Signed)
-----   Message from Arty Baumgartner, RN sent at 10/14/2019 12:59 PM EDT ----- Regarding: Alvy Bimler, first time First time gemzar. (pt also got [place], but has had [place] in the past)  Tolerated well.  Dr Alvy Bimler

## 2019-10-15 NOTE — Progress Notes (Signed)
.  rxcheom

## 2019-10-22 ENCOUNTER — Inpatient Hospital Stay: Payer: Managed Care, Other (non HMO)

## 2019-10-22 ENCOUNTER — Encounter: Payer: Self-pay | Admitting: Hematology and Oncology

## 2019-10-22 ENCOUNTER — Ambulatory Visit: Payer: Managed Care, Other (non HMO)

## 2019-10-22 ENCOUNTER — Other Ambulatory Visit: Payer: Self-pay

## 2019-10-22 ENCOUNTER — Inpatient Hospital Stay: Payer: Managed Care, Other (non HMO) | Attending: Gynecologic Oncology | Admitting: Hematology and Oncology

## 2019-10-22 ENCOUNTER — Other Ambulatory Visit: Payer: Self-pay | Admitting: Hematology and Oncology

## 2019-10-22 VITALS — BP 133/79 | HR 79 | Temp 97.4°F | Resp 18 | Ht 62.0 in | Wt 140.4 lb

## 2019-10-22 DIAGNOSIS — D696 Thrombocytopenia, unspecified: Secondary | ICD-10-CM | POA: Diagnosis not present

## 2019-10-22 DIAGNOSIS — Z7189 Other specified counseling: Secondary | ICD-10-CM

## 2019-10-22 DIAGNOSIS — R21 Rash and other nonspecific skin eruption: Secondary | ICD-10-CM

## 2019-10-22 DIAGNOSIS — C562 Malignant neoplasm of left ovary: Secondary | ICD-10-CM | POA: Insufficient documentation

## 2019-10-22 DIAGNOSIS — C786 Secondary malignant neoplasm of retroperitoneum and peritoneum: Secondary | ICD-10-CM | POA: Diagnosis not present

## 2019-10-22 DIAGNOSIS — D6481 Anemia due to antineoplastic chemotherapy: Secondary | ICD-10-CM | POA: Diagnosis not present

## 2019-10-22 DIAGNOSIS — Z5111 Encounter for antineoplastic chemotherapy: Secondary | ICD-10-CM | POA: Diagnosis present

## 2019-10-22 LAB — CMP (CANCER CENTER ONLY)
ALT: 20 U/L (ref 0–44)
AST: 20 U/L (ref 15–41)
Albumin: 3.5 g/dL (ref 3.5–5.0)
Alkaline Phosphatase: 75 U/L (ref 38–126)
Anion gap: 11 (ref 5–15)
BUN: 11 mg/dL (ref 8–23)
CO2: 25 mmol/L (ref 22–32)
Calcium: 9.3 mg/dL (ref 8.9–10.3)
Chloride: 106 mmol/L (ref 98–111)
Creatinine: 0.64 mg/dL (ref 0.44–1.00)
GFR, Est AFR Am: >60 mL/min (ref >60)
GFR, Estimated: >60 mL/min (ref >60)
Glucose, Bld: 116 mg/dL — ABNORMAL HIGH (ref 70–99)
Potassium: 3.4 mmol/L — ABNORMAL LOW (ref 3.5–5.1)
Sodium: 142 mmol/L (ref 135–145)
Total Bilirubin: 0.8 mg/dL (ref 0.3–1.2)
Total Protein: 6.4 g/dL — ABNORMAL LOW (ref 6.5–8.1)

## 2019-10-22 LAB — CBC WITH DIFFERENTIAL (CANCER CENTER ONLY)
Abs Immature Granulocytes: 0.02 10*3/uL (ref 0.00–0.07)
Basophils Absolute: 0 10*3/uL (ref 0.0–0.1)
Basophils Relative: 0 %
Eosinophils Absolute: 0.1 10*3/uL (ref 0.0–0.5)
Eosinophils Relative: 2 %
HCT: 35.4 % — ABNORMAL LOW (ref 36.0–46.0)
Hemoglobin: 12.1 g/dL (ref 12.0–15.0)
Immature Granulocytes: 1 %
Lymphocytes Relative: 37 %
Lymphs Abs: 1.5 10*3/uL (ref 0.7–4.0)
MCH: 31.6 pg (ref 26.0–34.0)
MCHC: 34.2 g/dL (ref 30.0–36.0)
MCV: 92.4 fL (ref 80.0–100.0)
Monocytes Absolute: 0.2 10*3/uL (ref 0.1–1.0)
Monocytes Relative: 4 %
Neutro Abs: 2.3 10*3/uL (ref 1.7–7.7)
Neutrophils Relative %: 56 %
Platelet Count: 118 10*3/uL — ABNORMAL LOW (ref 150–400)
RBC: 3.83 MIL/uL — ABNORMAL LOW (ref 3.87–5.11)
RDW: 11.2 % — ABNORMAL LOW (ref 11.5–15.5)
WBC Count: 4.1 10*3/uL (ref 4.0–10.5)
nRBC: 0 % (ref 0.0–0.2)

## 2019-10-22 MED ORDER — SODIUM CHLORIDE 0.9 % IV SOLN
Freq: Once | INTRAVENOUS | Status: AC
  Filled 2019-10-22: qty 250

## 2019-10-22 MED ORDER — PROCHLORPERAZINE MALEATE 10 MG PO TABS
ORAL_TABLET | ORAL | Status: AC
  Filled 2019-10-22: qty 1

## 2019-10-22 MED ORDER — DIPHENHYDRAMINE HCL 50 MG/ML IJ SOLN
INTRAMUSCULAR | Status: AC
  Filled 2019-10-22: qty 1

## 2019-10-22 MED ORDER — SODIUM CHLORIDE 0.9% FLUSH
10.0000 mL | Freq: Once | INTRAVENOUS | Status: AC
  Administered 2019-10-22: 10 mL
  Filled 2019-10-22: qty 10

## 2019-10-22 MED ORDER — SODIUM CHLORIDE 0.9% FLUSH
10.0000 mL | INTRAVENOUS | Status: DC | PRN
  Administered 2019-10-22: 10 mL
  Filled 2019-10-22: qty 10

## 2019-10-22 MED ORDER — DIPHENHYDRAMINE HCL 50 MG/ML IJ SOLN
12.5000 mg | Freq: Once | INTRAMUSCULAR | Status: AC
  Administered 2019-10-22: 12.5 mg via INTRAVENOUS

## 2019-10-22 MED ORDER — SODIUM CHLORIDE 0.9 % IV SOLN
600.0000 mg/m2 | Freq: Once | INTRAVENOUS | Status: AC
  Administered 2019-10-22: 1026 mg via INTRAVENOUS
  Filled 2019-10-22: qty 26.98

## 2019-10-22 MED ORDER — PROCHLORPERAZINE MALEATE 10 MG PO TABS
10.0000 mg | ORAL_TABLET | Freq: Once | ORAL | Status: AC
  Administered 2019-10-22: 10 mg via ORAL

## 2019-10-22 MED ORDER — PREDNISONE 20 MG PO TABS
20.0000 mg | ORAL_TABLET | Freq: Every day | ORAL | 0 refills | Status: DC
Start: 2019-10-22 — End: 2019-12-23

## 2019-10-22 MED ORDER — HEPARIN SOD (PORK) LOCK FLUSH 100 UNIT/ML IV SOLN
500.0000 [IU] | Freq: Once | INTRAVENOUS | Status: AC | PRN
  Administered 2019-10-22: 500 [IU]
  Filled 2019-10-22: qty 5

## 2019-10-22 MED FILL — predniSONE 20 MG TABS: 20 | 7 days supply | Qty: 7 | Fill #0

## 2019-10-22 NOTE — Patient Instructions (Signed)
[facility] Discharge Instructions for Patients Receiving Chemotherapy  Today you received the following chemotherapy agents: gemcitabine.  To help prevent nausea and vomiting after your treatment, we encourage you to take your nausea medication as directed.   If you develop nausea and vomiting that is not controlled by your nausea medication, call the clinic.   BELOW ARE SYMPTOMS THAT SHOULD BE REPORTED IMMEDIATELY:  *FEVER GREATER THAN 100.5 F  *CHILLS WITH OR WITHOUT FEVER  NAUSEA AND VOMITING THAT IS NOT CONTROLLED WITH YOUR NAUSEA MEDICATION  *UNUSUAL SHORTNESS OF BREATH  *UNUSUAL BRUISING OR BLEEDING  TENDERNESS IN MOUTH AND THROAT WITH OR WITHOUT PRESENCE OF ULCERS  *URINARY PROBLEMS  *BOWEL PROBLEMS  UNUSUAL RASH Items with * indicate a potential emergency and should be followed up as soon as possible.  Feel free to call the clinic should you have any questions or concerns. The clinic phone number is [phone].  Please show the CHEMO ALERT CARD at check-in to the Emergency Department and triage nurse.   

## 2019-10-22 NOTE — Assessment & Plan Note (Signed)
This is likely due to recent treatment. The patient denies recent history of bleeding such as epistaxis, hematuria or hematochezia. She is asymptomatic from the low platelet count. I will observe for now.  she does not require transfusion now. I will continue the chemotherapy at current dose without dosage adjustment.  If the thrombocytopenia gets progressive worse in the future, I might have to delay her treatment or adjust the chemotherapy dose.   

## 2019-10-22 NOTE — Patient Instructions (Signed)

## 2019-10-22 NOTE — Progress Notes (Signed)
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 The patient denies recent history of bleeding such as epistaxis, hematuria or hematochezia. She is asymptomatic from the low platelet count. I will observe for now.  she does not require transfusion now. I will continue the chemotherapy at current dose without dosage adjustment.  If the thrombocytopenia gets progressive worse in the future, I might have to delay her treatment or adjust the chemotherapy dose.    Skin rash She has persistent skin rash since port insertion secondary to allergic reaction to the glue I recommend trial of prednisone therapy She will also get some Benadryl today   No orders of the defined types were placed in this encounter.   All questions were answered. The patient knows to call the clinic with any problems, questions or concerns. The total time spent in the appointment was 20 minutes encounter with patients including review of chart and various tests results, discussions about plan of care and coordination of care plan   [name] Lark, MD 10/22/2019 9:32 AM  INTERVAL HISTORY: Please see below for problem oriented charting. 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 14.7 cm poorly defined soft tissue mass in central pelvis suspicious for primary ovarian carcinoma, with uterine leiomyosarcoma considered a less likely differential diagnosis. 2. Moderate ascites and diffuse peritoneal carcinomatosis. 3. Several small uterine fibroids.   11/13/2018 Tumor Marker   Patient's tumor was tested for the following markers: CA-125 Results of the tumor marker test revealed 1434   11/21/2018 Procedure   Successful ultrasound-guided diagnostic and therapeutic paracentesis yielding 3.1 liters of peritoneal fluid   11/21/2018 Pathology Results   PERITONEAL/ASCITIC FLUID(SPECIMEN 1 OF 1 COLLECTED 11/21/18): ADENOCARCINOMA. Specimen Clinical Information Pelvic mass suspicious for ovarian cancer Source Peritoneal/Ascitic Fluid, (specimen 1 of 1 collected 11/21/18) Gross Specimen: Received is/are 1000 ccs of dark amber fluid. (CM:cm) Prepared: # Smears: 0 # Concentration Technique Slides (i.e. 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   11/29/2018 Initial Diagnosis   Ovarian cancer ([place])   11/29/2018 Cancer Staging   Staging form: Ovary, Fallopian Tube, and Primary Peritoneal Carcinoma, AJCC 8th Edition - Clinical: cT3, cN0, cM0 - Signed by [name] Lark, MD on 11/29/2018   12/03/2018 Procedure   Successful ultrasound-guided therapeutic paracentesis yielding 3.7 liters of peritoneal fluid.     12/06/2018 Procedure   Placement of a subcutaneous port device. Catheter tip at the SVC and right atrium junction   12/14/2018 Procedure   Successful ultrasound-guided paracentesis yielding 2.9 L of peritoneal fluid   12/28/2018 Tumor  Marker   Patient's tumor was tested for the following markers: CA-125 Results of the tumor marker  test revealed 812.   12/28/2018 - 04/22/2019 Chemotherapy   The patient had carboplatin and taxol for neoadjuvant treatment, followed by interval debulking surgery and subsequent adjuvant chemotherapy treatment.     02/01/2019 Imaging   Ct abdomen and pelvis 8.6 cm left ovarian mass, corresponding to the patient's known primary neoplasm, improved.   Mild peritoneal nodularity/omental caking, improved.   Small abdominopelvic ascites, improved.     02/01/2019 Tumor Marker   Patient's tumor was tested for the following markers: CA-125 Results of the tumor marker test revealed 183.   02/12/2019 Surgery   Preoperative Diagnosis: Stage IIIC ovarian cancer, s/p neoadjuvant chemotherapy      Procedure(s) Performed: 1. Exploratory laparotomy with total abdominal hysterectomy, bilateral salpingo-oophorectomy, omentectomy radical tumor debulking for ovarian cancer.   Surgeon: Thereasa Solo, MD.    Operative Findings: upper abdomen free of disease. No visible omental disease. Small volume (200cc) ascites. 8cm friable mass replacing left ovary and adherent to the sigmoid colon mesentery and ureter on the left.  Anterior fibroid. This represented an optimal cytoreduction (R0) with no gross visible disease remaining.    02/12/2019 Pathology Results   A. OVARY AND FALLOPIAN TUBE, LEFT, SALPINGO-OOPHORECTOMY: - Serous carcinoma, high grade, status post neoadjuvant therapy - See oncology table and comment below B. UTERUS CERVIX WITH RIGHT FALLOPIAN TUBE AND OVARY, HYSTERECTOMY: Uterus: - Serosal surface involved by serous carcinoma - Endomyometrium uninvolved by carcinoma - Benign endometrial polyp (4.1 cm) - Leiomyomata (5.5 cm; largest) - Adenomyosis Cervix: - Uninvolved by carcinoma Left ovary: - Serous carcinoma, high grade Left fallopian tube: - Serous carcinoma, high grade C. SOFT TISSUE, LEFT PELVIC SIDEWALL TUMOR, EXCISION: - Metastatic serous carcinoma, high-grade D. SOFT TISSUE,  SIGMOID COLON MESENTERY, EXCISION: - Metastatic serous carcinoma, high-grade E. OMENTUM, TUMOR RESECTION: - Metastatic serous carcinoma, high-grade OVARY or FALLOPIAN TUBE or PRIMARY PERITONEUM: Procedure: Total hysterectomy and bilateral salpingo-oophorectomy, omentectomy and peritoneal biopsies Specimen Integrity: Fragmented Tumor Site: Left ovary and fallopian tube Ovarian Surface Involvement: Present Fallopian Tube Surface Involvement: Present Tumor Size: 6.3 cm (see comment) Histologic Type: Serous carcinoma Histologic Grade: High-grade Implants: Not applicable Other Tissue/ Organ Involvement: Right ovary, right fallopian tube, omentum, mesentery Largest Extrapelvic Peritoneal Focus: Macroscopic Peritoneal/Ascitic Fluid: Malignant Treatment Effect: No definite or minimal response identified (chemotherapy response score 1 [CRS1] Regional Lymph Nodes: No lymph nodes submitted or found Pathologic Stage Classification (pTNM, AJCC 8th Edition): pT3c, pNX Representative Tumor Block: A4 Comment(s): Additional testing (HER-2, MMR and MSI) are pending. The primary tumor site appears to be the left ovary and fallopian tube. The uterus is only involved on the serosal surface. There is tumor on the anterior peritoneal reflection.  Addendum: Tumor is Her2 negative,MSI stable   02/19/2019 Tumor Marker   Patient's tumor was tested for the following markers: CA-125 Results of the tumor marker test revealed 40.3   04/01/2019 Tumor Marker   Patient's tumor was tested for the following markers: CA-125 Results of the tumor marker test revealed 8.1    Genetic Testing   Negative testing. No pathogenic variants identified on the [employer]. The report date is 04/17/2019.  Somatic genes analyzed through [facility]: ATM, BARD1, BRCA1, BRCA2, BRIP1, CHEK2, MRE11A, NBN, PALB2, RAD51C, RAD51D.  The [facility] gene panel offered by [facility] includes sequencing and  rearrangement analysis for the following 36 genes: APC*, ATM*, AXIN2, BARD1, BMPR1A, BRCA1*, BRCA2*, BRIP1*, CDH1*, CDK4,  CDKN2A, CHEK2*, DICER1, MLH1*, MSH2*, MSH3, MSH6*, MUTYH*, NBN, NF1*, NTHL1, PALB2*, PMS2*, PTEN*, RAD51C*, RAD51D*, RECQL, SMAD4, SMARCA4, STK11 and TP53* (sequencing and deletion/duplication); HOXB13, POLD1 and POLE (sequencing only); EPCAM and GREM1 (deletion/duplication only).    05/21/2019 Imaging   1. Interval hysterectomy, oophorectomy and omentectomy. No evidence of residual ovarian carcinoma in the pelvis. No evidence of peritoneal disease. No intraperitoneal free fluid. 2. Nonobstructing LEFT renal calculi.  Normal ureters.   05/22/2019 Tumor Marker   Patient's tumor was tested for the following markers: CA-125 Results of the tumor marker test revealed 5.7.   06/03/2019 Procedure   Successful right IJ vein Port-A-Cath explant.   08/26/2019 Tumor Marker   Patient's tumor was tested for the following markers: CA-125. Results of the tumor marker test revealed 4.9   10/03/2019 Imaging   1. New tumor deposits along the capsular surfaces of the liver, spleen, in the left pelvis, and right paracolic gutter, compatible with metastatic disease. 2. Mild dilation of the dorsal pancreatic duct in the pancreatic body and head, cause uncertain. 3. Nonobstructive left nephrolithiasis. 4. Aortic atherosclerosis.   Aortic Atherosclerosis (ICD10-I70.0   10/10/2019 Procedure   Successful placement of a right IJ approach Power Port with ultrasound and fluoroscopic guidance. The catheter is ready for use.     10/11/2019 Tumor Marker   Patient's tumor was tested for the following markers: CA-125 Results of the tumor marker test revealed 19.6.   10/14/2019 -  Chemotherapy   The patient had carboplatin and taxol for chemotherapy treatment.       REVIEW OF SYSTEMS:   Constitutional: Denies fevers, chills or abnormal weight loss Eyes: Denies blurriness of vision Ears, nose,  mouth, throat, and face: Denies mucositis or sore throat Respiratory: Denies cough, dyspnea or wheezes Cardiovascular: Denies palpitation, chest discomfort or lower extremity swelling Gastrointestinal:  Denies nausea, heartburn or change in bowel habits Lymphatics: Denies new lymphadenopathy or easy bruising Neurological:Denies numbness, tingling or new weaknesses Behavioral/Psych: Mood is stable, no new changes  All other systems were reviewed with the patient and are negative.  I have reviewed the past medical history, past surgical history, social history and family history with the patient and they are unchanged from previous note.  ALLERGIES:  is allergic to skin adhesives [cyanoacrylate]; shrimp [shellfish allergy]; and sulfonamide derivatives.  MEDICATIONS:  Current Outpatient Medications  Medication Sig Dispense Refill  . ALPRAZolam (XANAX) 0.25 MG tablet Take 0.25 mg by mouth at bedtime as needed for anxiety.    . carboxymethylcellulose (REFRESH PLUS) 0.5 % SOLN Place 1 drop into both eyes 2 (two) times daily as needed (dry eyes).    . Cholecalciferol (VITAMIN D3) 25 MCG (1000 UT) CAPS Take 1,000 Units by mouth daily.     . diphenhydrAMINE (BENADRYL) 25 mg capsule Take 25 mg by mouth at bedtime as needed for sleep.    [name] Kitchen esomeprazole (NEXIUM) 20 MG capsule Take 20 mg by mouth daily.     [name] Kitchen lidocaine-prilocaine (EMLA) cream Apply 1 application topically as needed.    . loratadine (CLARITIN) 10 MG tablet Take 10 mg by mouth daily as needed for allergies.    . Multiple Vitamin (MULTIVITAMIN WITH MINERALS) TABS tablet Take 1 tablet by mouth daily.    . ondansetron (ZOFRAN) 8 MG tablet Take 8 mg by mouth every 8 (eight) hours as needed.    . predniSONE (DELTASONE) 20 MG tablet Take 1 tablet (20 mg total) by mouth daily with breakfast. 7 tablet 0  . prochlorperazine (COMPAZINE)  10 MG tablet Take 10 mg by mouth every 6 (six) hours as needed.     No current facility-administered  medications for this visit.   Facility-Administered Medications Ordered in Other Visits  Medication Dose Route Frequency Provider Last Rate Last Admin  . gemcitabine (GEMZAR) 1,026 mg in sodium chloride 0.9 % 250 mL chemo infusion  600 mg/m2 (Treatment Plan Recorded) Intravenous Once Alvy Bimler, Leasia Swann, MD      . heparin lock flush 100 unit/mL  500 Units Intracatheter Once PRN Alvy Bimler, Madalyn Legner, MD      . sodium chloride flush (NS) 0.9 % injection 10 mL  10 mL Intracatheter PRN Alvy Bimler, Jamarii Banks, MD        PHYSICAL EXAMINATION: ECOG PERFORMANCE STATUS: 1 - Symptomatic but completely ambulatory  Vitals:   10/22/19 0821  BP: 133/79  Pulse: 79  Resp: 18  Temp: (!) 97.4 F (36.3 C)  SpO2: 100%   Filed Weights   10/22/19 0821  Weight: 140 lb 6.4 oz (63.7 kg)    GENERAL:alert, no distress and comfortable SKIN: Noted persistent skin rash around the port site  EYES: normal, Conjunctiva are pink and non-injected, sclera clear OROPHARYNX:no exudate, no erythema and lips, buccal mucosa, and tongue normal  NECK: supple, thyroid normal size, non-tender, without nodularity LYMPH:  no palpable lymphadenopathy in the cervical, axillary or inguinal LUNGS: clear to auscultation and percussion with normal breathing effort HEART: regular rate & rhythm and no murmurs and no lower extremity edema ABDOMEN:abdomen soft, non-tender and normal bowel sounds Musculoskeletal:no cyanosis of digits and no clubbing  NEURO: alert & oriented x 3 with fluent speech, no focal motor/sensory deficits  LABORATORY DATA:  I have reviewed the data as listed    Component Value Date/Time   NA 142 10/22/2019 0804   K 3.4 (L) 10/22/2019 0804   CL 106 10/22/2019 0804   CO2 25 10/22/2019 0804   GLUCOSE 116 (H) 10/22/2019 0804   BUN 11 10/22/2019 0804   CREATININE 0.64 10/22/2019 0804   CALCIUM 9.3 10/22/2019 0804   PROT 6.4 (L) 10/22/2019 0804   ALBUMIN 3.5 10/22/2019 0804   AST 20 10/22/2019 0804   ALT 20 10/22/2019 0804    ALKPHOS 75 10/22/2019 0804   BILITOT 0.8 10/22/2019 0804   GFRNONAA >60 10/22/2019 0804   GFRAA >60 10/22/2019 0804    No results found for: SPEP, UPEP  Lab Results  Component Value Date   WBC 4.1 10/22/2019   NEUTROABS 2.3 10/22/2019   HGB 12.1 10/22/2019   HCT 35.4 (L) 10/22/2019   MCV 92.4 10/22/2019   PLT 118 (L) 10/22/2019      Chemistry      Component Value Date/Time   NA 142 10/22/2019 0804   K 3.4 (L) 10/22/2019 0804   CL 106 10/22/2019 0804   CO2 25 10/22/2019 0804   BUN 11 10/22/2019 0804   CREATININE 0.64 10/22/2019 0804      Component Value Date/Time   CALCIUM 9.3 10/22/2019 0804   ALKPHOS 75 10/22/2019 0804   AST 20 10/22/2019 0804   ALT 20 10/22/2019 0804   BILITOT 0.8 10/22/2019 0804

## 2019-10-22 NOTE — Assessment & Plan Note (Signed)
She has persistent skin rash since port insertion secondary to allergic reaction to the glue I recommend trial of prednisone therapy She will also get some Benadryl today

## 2019-10-22 NOTE — Assessment & Plan Note (Signed)
She has mild intermittent right upper quadrant discomfort that comes and goes, could be disease related or referred pain from recent port insertion We will proceed with treatment as scheduled I will see her prior to cycle 2 of therapy

## 2019-10-23 ENCOUNTER — Telehealth: Payer: Self-pay | Admitting: Hematology and Oncology

## 2019-10-23 NOTE — Telephone Encounter (Signed)
Scheduled per 6/1 sch message. Pt aware of appts.

## 2019-11-04 ENCOUNTER — Inpatient Hospital Stay: Payer: Managed Care, Other (non HMO)

## 2019-11-04 ENCOUNTER — Other Ambulatory Visit: Payer: Self-pay

## 2019-11-04 ENCOUNTER — Inpatient Hospital Stay (HOSPITAL_BASED_OUTPATIENT_CLINIC_OR_DEPARTMENT_OTHER): Payer: Managed Care, Other (non HMO) | Admitting: Medical

## 2019-11-04 ENCOUNTER — Inpatient Hospital Stay (HOSPITAL_BASED_OUTPATIENT_CLINIC_OR_DEPARTMENT_OTHER): Payer: Managed Care, Other (non HMO) | Admitting: Hematology and Oncology

## 2019-11-04 ENCOUNTER — Other Ambulatory Visit: Payer: Self-pay | Admitting: Medical

## 2019-11-04 VITALS — BP 105/66 | HR 71 | Resp 20

## 2019-11-04 DIAGNOSIS — C562 Malignant neoplasm of left ovary: Secondary | ICD-10-CM

## 2019-11-04 DIAGNOSIS — R748 Abnormal levels of other serum enzymes: Secondary | ICD-10-CM

## 2019-11-04 DIAGNOSIS — R0789 Other chest pain: Secondary | ICD-10-CM

## 2019-11-04 DIAGNOSIS — C786 Secondary malignant neoplasm of retroperitoneum and peritoneum: Secondary | ICD-10-CM

## 2019-11-04 DIAGNOSIS — Z7189 Other specified counseling: Secondary | ICD-10-CM

## 2019-11-04 DIAGNOSIS — D6481 Anemia due to antineoplastic chemotherapy: Secondary | ICD-10-CM | POA: Diagnosis not present

## 2019-11-04 DIAGNOSIS — T451X5A Adverse effect of antineoplastic and immunosuppressive drugs, initial encounter: Secondary | ICD-10-CM | POA: Diagnosis not present

## 2019-11-04 DIAGNOSIS — K219 Gastro-esophageal reflux disease without esophagitis: Secondary | ICD-10-CM

## 2019-11-04 DIAGNOSIS — Z5111 Encounter for antineoplastic chemotherapy: Secondary | ICD-10-CM | POA: Diagnosis not present

## 2019-11-04 DIAGNOSIS — C569 Malignant neoplasm of unspecified ovary: Secondary | ICD-10-CM

## 2019-11-04 LAB — CMP (CANCER CENTER ONLY)
ALT: 58 U/L — ABNORMAL HIGH (ref 0–44)
AST: 37 U/L (ref 15–41)
Albumin: 3.7 g/dL (ref 3.5–5.0)
Alkaline Phosphatase: 82 U/L (ref 38–126)
Anion gap: 8 (ref 5–15)
BUN: 12 mg/dL (ref 8–23)
CO2: 26 mmol/L (ref 22–32)
Calcium: 9 mg/dL (ref 8.9–10.3)
Chloride: 109 mmol/L (ref 98–111)
Creatinine: 0.63 mg/dL (ref 0.44–1.00)
GFR, Est AFR Am: >60 mL/min (ref >60)
GFR, Estimated: >60 mL/min (ref >60)
Glucose, Bld: 94 mg/dL (ref 70–99)
Potassium: 3.7 mmol/L (ref 3.5–5.1)
Sodium: 143 mmol/L (ref 135–145)
Total Bilirubin: 0.8 mg/dL (ref 0.3–1.2)
Total Protein: 6.1 g/dL — ABNORMAL LOW (ref 6.5–8.1)

## 2019-11-04 LAB — CBC WITH DIFFERENTIAL (CANCER CENTER ONLY)
Abs Immature Granulocytes: 0.01 10*3/uL (ref 0.00–0.07)
Basophils Absolute: 0 10*3/uL (ref 0.0–0.1)
Basophils Relative: 0 %
Eosinophils Absolute: 0 10*3/uL (ref 0.0–0.5)
Eosinophils Relative: 1 %
HCT: 34.9 % — ABNORMAL LOW (ref 36.0–46.0)
Hemoglobin: 11.7 g/dL — ABNORMAL LOW (ref 12.0–15.0)
Immature Granulocytes: 0 %
Lymphocytes Relative: 35 %
Lymphs Abs: 1.5 10*3/uL (ref 0.7–4.0)
MCH: 31.9 pg (ref 26.0–34.0)
MCHC: 33.5 g/dL (ref 30.0–36.0)
MCV: 95.1 fL (ref 80.0–100.0)
Monocytes Absolute: 0.6 10*3/uL (ref 0.1–1.0)
Monocytes Relative: 13 %
Neutro Abs: 2.2 10*3/uL (ref 1.7–7.7)
Neutrophils Relative %: 51 %
Platelet Count: 288 10*3/uL (ref 150–400)
RBC: 3.67 MIL/uL — ABNORMAL LOW (ref 3.87–5.11)
RDW: 12.7 % (ref 11.5–15.5)
WBC Count: 4.2 10*3/uL (ref 4.0–10.5)
nRBC: 0 % (ref 0.0–0.2)

## 2019-11-04 MED ORDER — DIPHENHYDRAMINE HCL 25 MG PO CAPS
ORAL_CAPSULE | ORAL | Status: AC
  Filled 2019-11-04: qty 1

## 2019-11-04 MED ORDER — FAMOTIDINE IN NACL 20-0.9 MG/50ML-% IV SOLN
INTRAVENOUS | Status: AC
  Filled 2019-11-04: qty 50

## 2019-11-04 MED ORDER — LIDOCAINE VISCOUS HCL 2 % MT SOLN
15.0000 mL | Freq: Once | OROMUCOSAL | Status: AC
  Administered 2019-11-04: 15 mL via ORAL

## 2019-11-04 MED ORDER — ALUM & MAG HYDROXIDE-SIMETH 200-200-20 MG/5ML PO SUSP
ORAL | Status: AC
  Filled 2019-11-04: qty 30

## 2019-11-04 MED ORDER — SODIUM CHLORIDE 0.9 % IV SOLN
600.0000 mg/m2 | Freq: Once | INTRAVENOUS | Status: AC
  Administered 2019-11-04: 1026 mg via INTRAVENOUS
  Filled 2019-11-04: qty 26.98

## 2019-11-04 MED ORDER — PALONOSETRON HCL INJECTION 0.25 MG/5ML
INTRAVENOUS | Status: AC
  Filled 2019-11-04: qty 5

## 2019-11-04 MED ORDER — DIPHENHYDRAMINE HCL 25 MG PO TABS
25.0000 mg | ORAL_TABLET | Freq: Once | ORAL | Status: AC
  Administered 2019-11-04: 25 mg via ORAL
  Filled 2019-11-04: qty 1

## 2019-11-04 MED ORDER — SODIUM CHLORIDE 0.9 % IV SOLN
332.4000 mg | Freq: Once | INTRAVENOUS | Status: AC
  Administered 2019-11-04: 330 mg via INTRAVENOUS
  Filled 2019-11-04: qty 33

## 2019-11-04 MED ORDER — PALONOSETRON HCL INJECTION 0.25 MG/5ML
0.2500 mg | Freq: Once | INTRAVENOUS | Status: AC
  Administered 2019-11-04: 0.25 mg via INTRAVENOUS

## 2019-11-04 MED ORDER — SODIUM CHLORIDE 0.9 % IV SOLN
Freq: Once | INTRAVENOUS | Status: AC
  Filled 2019-11-04: qty 250

## 2019-11-04 MED ORDER — LORAZEPAM 2 MG/ML IJ SOLN
0.5000 mg | Freq: Once | INTRAMUSCULAR | Status: AC
  Administered 2019-11-04: 0.5 mg via INTRAVENOUS

## 2019-11-04 MED ORDER — LIDOCAINE VISCOUS HCL 2 % MT SOLN
OROMUCOSAL | Status: AC
  Filled 2019-11-04: qty 15

## 2019-11-04 MED ORDER — SODIUM CHLORIDE 0.9 % IV SOLN
150.0000 mg | Freq: Once | INTRAVENOUS | Status: AC
  Administered 2019-11-04: 150 mg via INTRAVENOUS
  Filled 2019-11-04: qty 150

## 2019-11-04 MED ORDER — SODIUM CHLORIDE 0.9% FLUSH
10.0000 mL | Freq: Once | INTRAVENOUS | Status: AC
  Administered 2019-11-04: 10 mL
  Filled 2019-11-04: qty 10

## 2019-11-04 MED ORDER — HEPARIN SOD (PORK) LOCK FLUSH 100 UNIT/ML IV SOLN
500.0000 [IU] | Freq: Once | INTRAVENOUS | Status: AC | PRN
  Administered 2019-11-04: 500 [IU]
  Filled 2019-11-04: qty 5

## 2019-11-04 MED ORDER — FAMOTIDINE IN NACL 20-0.9 MG/50ML-% IV SOLN
20.0000 mg | Freq: Once | INTRAVENOUS | Status: AC
  Administered 2019-11-04: 20 mg via INTRAVENOUS

## 2019-11-04 MED ORDER — SODIUM CHLORIDE 0.9 % IV SOLN
10.0000 mg | Freq: Once | INTRAVENOUS | Status: AC
  Administered 2019-11-04: 10 mg via INTRAVENOUS
  Filled 2019-11-04: qty 10

## 2019-11-04 MED ORDER — SODIUM CHLORIDE 0.9% FLUSH
10.0000 mL | INTRAVENOUS | Status: DC | PRN
  Administered 2019-11-04: 10 mL
  Filled 2019-11-04: qty 10

## 2019-11-04 MED ORDER — LORAZEPAM 2 MG/ML IJ SOLN
INTRAMUSCULAR | Status: AC
  Filled 2019-11-04: qty 1

## 2019-11-04 MED ORDER — ALUM & MAG HYDROXIDE-SIMETH 200-200-20 MG/5ML PO SUSP
30.0000 mL | Freq: Once | ORAL | Status: AC
  Administered 2019-11-04: 30 mL via ORAL

## 2019-11-04 NOTE — Progress Notes (Signed)
1515 - Pt stated she didn't feel well, dizzy, nauseated, diaphoretic.  VS WNL, V. Tanner PA informed.  Ativan 0.5 mg given as recorded on MAR.  Pt went to [facility], had large episode of diarrhea.  EKG done, NSR.  GI cocktail given, Carbo restarted at 1607.  Pt in stable condition.

## 2019-11-04 NOTE — Patient Instructions (Signed)
[facility] Discharge Instructions for Patients Receiving Chemotherapy  Today you received the following chemotherapy agents: Carboplatin and gemzar  To help prevent nausea and vomiting after your treatment, we encourage you to take your nausea medication as directed If you develop nausea and vomiting that is not controlled by your nausea medication, call the clinic.   BELOW ARE SYMPTOMS THAT SHOULD BE REPORTED IMMEDIATELY:  *FEVER GREATER THAN 100.5 F  *CHILLS WITH OR WITHOUT FEVER  NAUSEA AND VOMITING THAT IS NOT CONTROLLED WITH YOUR NAUSEA MEDICATION  *UNUSUAL SHORTNESS OF BREATH  *UNUSUAL BRUISING OR BLEEDING  TENDERNESS IN MOUTH AND THROAT WITH OR WITHOUT PRESENCE OF ULCERS  *URINARY PROBLEMS  *BOWEL PROBLEMS  UNUSUAL RASH Items with * indicate a potential emergency and should be followed up as soon as possible.  Feel free to call the clinic should you have any questions or concerns. The clinic phone number is [phone].  Please show the [facility] at check-in to the Emergency Department and triage nurse.

## 2019-11-05 ENCOUNTER — Encounter: Payer: Self-pay | Admitting: Hematology and Oncology

## 2019-11-05 LAB — CA 125: Cancer Antigen (CA) 125: 11.4 U/mL (ref 0.0–38.1)

## 2019-11-05 NOTE — Assessment & Plan Note (Signed)
She tolerated treatment well except for mild intermittent right upper quadrant discomfort Tumor marker is stable I recommend minimum 3 cycles of therapy before repeating imaging study

## 2019-11-05 NOTE — Progress Notes (Signed)
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 The patient denies recent history of bleeding such as epistaxis, hematuria or hematochezia. She is asymptomatic from the anemia. I will observe for now.  She does not require transfusion now. I will continue the chemotherapy at current dose without dosage adjustment.  If the anemia gets progressive worse in the future, I might have to delay her treatment or adjust the chemotherapy dose.   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   All questions were answered. The patient knows to call the clinic with any problems, questions or concerns. The total time spent in the appointment was 20 minutes encounter with patients including review of chart and various tests results, discussions about plan of care and coordination of care plan   [name] Lark, MD 11/05/2019 9:36 AM  INTERVAL HISTORY: Please see below for problem oriented charting. She is seen prior to cycle 2 of treatment She tolerated treatment fairly well without major side effects No significant nausea or constipation Denies significant abdominal distention She has intermittent right upper quadrant discomfort that comes and goes The patient denies any recent signs or symptoms of bleeding such as spontaneous epistaxis, hematuria or hematochezia.   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 14.7 cm poorly defined soft tissue mass in central pelvis suspicious for primary ovarian carcinoma, with uterine leiomyosarcoma considered a less likely differential diagnosis. 2. Moderate ascites and diffuse peritoneal carcinomatosis. 3. Several small uterine fibroids.   11/13/2018 Tumor Marker   Patient's tumor was tested for the following markers: CA-125 Results of the tumor marker test revealed 1434   11/21/2018 Procedure   Successful ultrasound-guided diagnostic and therapeutic paracentesis yielding 3.1 liters of peritoneal fluid   11/21/2018 Pathology Results   PERITONEAL/ASCITIC FLUID(SPECIMEN 1 OF 1 COLLECTED 11/21/18): ADENOCARCINOMA. Specimen Clinical Information Pelvic mass suspicious for ovarian cancer Source Peritoneal/Ascitic Fluid, (specimen 1 of 1 collected 11/21/18) Gross Specimen: Received is/are 1000 ccs of dark amber fluid. (CM:cm) Prepared: # Smears: 0 # Concentration Technique Slides (i.e. 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   11/29/2018 Initial Diagnosis   Ovarian cancer ([place])   11/29/2018 Cancer Staging   Staging form: Ovary, Fallopian Tube, and Primary Peritoneal Carcinoma, AJCC 8th Edition - Clinical: cT3, cN0, cM0 - Signed by [name] Lark, MD on 11/29/2018   12/03/2018 Procedure   Successful ultrasound-guided therapeutic paracentesis yielding 3.7 liters of peritoneal fluid.     12/06/2018 Procedure   Placement of a subcutaneous port device. Catheter tip at the SVC and right atrium junction   12/14/2018 Procedure   Successful ultrasound-guided paracentesis yielding 2.9 L of peritoneal fluid   12/28/2018 Tumor Marker   Patient's tumor was tested for the following markers: CA-125 Results of the  tumor marker  test revealed 812.   12/28/2018 - 04/22/2019 Chemotherapy   The patient had carboplatin and taxol for neoadjuvant treatment, followed by interval debulking surgery and subsequent adjuvant chemotherapy treatment.     02/01/2019 Imaging   Ct abdomen and pelvis 8.6 cm left ovarian mass, corresponding to the patient's known primary neoplasm, improved.   Mild peritoneal nodularity/omental caking, improved.   Small abdominopelvic ascites, improved.     02/01/2019 Tumor Marker   Patient's tumor was tested for the following markers: CA-125 Results of the tumor marker test revealed 183.   02/12/2019 Surgery   Preoperative Diagnosis: Stage IIIC ovarian cancer, s/p neoadjuvant chemotherapy      Procedure(s) Performed: 1. Exploratory laparotomy with total abdominal hysterectomy, bilateral salpingo-oophorectomy, omentectomy radical tumor debulking for ovarian cancer.   Surgeon: Thereasa Solo, MD.    Operative Findings: upper abdomen free of disease. No visible omental disease. Small volume (200cc) ascites. 8cm friable mass replacing left ovary and adherent to the sigmoid colon mesentery and ureter on the left.  Anterior fibroid. This represented an optimal cytoreduction (R0) with no gross visible disease remaining.    02/12/2019 Pathology Results   A. OVARY AND FALLOPIAN TUBE, LEFT, SALPINGO-OOPHORECTOMY: - Serous carcinoma, high grade, status post neoadjuvant therapy - See oncology table and comment below B. UTERUS CERVIX WITH RIGHT FALLOPIAN TUBE AND OVARY, HYSTERECTOMY: Uterus: - Serosal surface involved by serous carcinoma - Endomyometrium uninvolved by carcinoma - Benign endometrial polyp (4.1 cm) - Leiomyomata (5.5 cm; largest) - Adenomyosis Cervix: - Uninvolved by carcinoma Left ovary: - Serous carcinoma, high grade Left fallopian tube: - Serous carcinoma, high grade C. SOFT TISSUE, LEFT PELVIC SIDEWALL TUMOR, EXCISION: - Metastatic serous carcinoma, high-grade D. SOFT TISSUE,  SIGMOID COLON MESENTERY, EXCISION: - Metastatic serous carcinoma, high-grade E. OMENTUM, TUMOR RESECTION: - Metastatic serous carcinoma, high-grade OVARY or FALLOPIAN TUBE or PRIMARY PERITONEUM: Procedure: Total hysterectomy and bilateral salpingo-oophorectomy, omentectomy and peritoneal biopsies Specimen Integrity: Fragmented Tumor Site: Left ovary and fallopian tube Ovarian Surface Involvement: Present Fallopian Tube Surface Involvement: Present Tumor Size: 6.3 cm (see comment) Histologic Type: Serous carcinoma Histologic Grade: High-grade Implants: Not applicable Other Tissue/ Organ Involvement: Right ovary, right fallopian tube, omentum, mesentery Largest Extrapelvic Peritoneal Focus: Macroscopic Peritoneal/Ascitic Fluid: Malignant Treatment Effect: No definite or minimal response identified (chemotherapy response score 1 [CRS1] Regional Lymph Nodes: No lymph nodes submitted or found Pathologic Stage Classification (pTNM, AJCC 8th Edition): pT3c, pNX Representative Tumor Block: A4 Comment(s): Additional testing (HER-2, MMR and MSI) are pending. The primary tumor site appears to be the left ovary and fallopian tube. The uterus is only involved on the serosal surface. There is tumor on the anterior peritoneal reflection.  Addendum: Tumor is Her2 negative,MSI stable   02/19/2019 Tumor Marker   Patient's tumor was tested for the following markers: CA-125 Results of the tumor marker test revealed 40.3   04/01/2019 Tumor Marker   Patient's tumor was tested for the following markers: CA-125 Results of the tumor marker test revealed 8.1    Genetic Testing   Negative testing. No pathogenic variants identified on the [employer]. The report date is 04/17/2019.  Somatic genes analyzed through [facility]: ATM, BARD1, BRCA1, BRCA2, BRIP1, CHEK2, MRE11A, NBN, PALB2, RAD51C, RAD51D.  The CancerNext gene panel offered by [employer] includes sequencing and  rearrangement analysis for the following 36 genes: APC*, ATM*, AXIN2, BARD1, BMPR1A, BRCA1*, BRCA2*, BRIP1*, CDH1*, CDK4, CDKN2A, CHEK2*, DICER1, MLH1*, MSH2*, MSH3, MSH6*, MUTYH*, NBN, NF1*, NTHL1, PALB2*, PMS2*, PTEN*, RAD51C*,  RAD51D*, RECQL, SMAD4, SMARCA4, STK11 and TP53* (sequencing and deletion/duplication); HOXB13, POLD1 and POLE (sequencing only); EPCAM and GREM1 (deletion/duplication only).    05/21/2019 Imaging   1. Interval hysterectomy, oophorectomy and omentectomy. No evidence of residual ovarian carcinoma in the pelvis. No evidence of peritoneal disease. No intraperitoneal free fluid. 2. Nonobstructing LEFT renal calculi.  Normal ureters.   05/22/2019 Tumor Marker   Patient's tumor was tested for the following markers: CA-125 Results of the tumor marker test revealed 5.7.   06/03/2019 Procedure   Successful right IJ vein Port-A-Cath explant.   08/26/2019 Tumor Marker   Patient's tumor was tested for the following markers: CA-125. Results of the tumor marker test revealed 4.9   10/03/2019 Imaging   1. New tumor deposits along the capsular surfaces of the liver, spleen, in the left pelvis, and right paracolic gutter, compatible with metastatic disease. 2. Mild dilation of the dorsal pancreatic duct in the pancreatic body and head, cause uncertain. 3. Nonobstructive left nephrolithiasis. 4. Aortic atherosclerosis.   Aortic Atherosclerosis (ICD10-I70.0   10/10/2019 Procedure   Successful placement of a right IJ approach Power Port with ultrasound and fluoroscopic guidance. The catheter is ready for use.     10/11/2019 Tumor Marker   Patient's tumor was tested for the following markers: CA-125 Results of the tumor marker test revealed 19.6.   10/14/2019 -  Chemotherapy   The patient had carboplatin and gemzar for chemotherapy treatment.     11/04/2019 Tumor Marker   Patient's tumor was tested for the following markers: CA-125 Results of the tumor marker test revealed 11.4      REVIEW OF SYSTEMS:   Constitutional: Denies fevers, chills or abnormal weight loss Eyes: Denies blurriness of vision Ears, nose, mouth, throat, and face: Denies mucositis or sore throat Respiratory: Denies cough, dyspnea or wheezes Cardiovascular: Denies palpitation, chest discomfort or lower extremity swelling Gastrointestinal:  Denies nausea, heartburn or change in bowel habits Skin: Denies abnormal skin rashes Lymphatics: Denies new lymphadenopathy or easy bruising Neurological:Denies numbness, tingling or new weaknesses Behavioral/Psych: Mood is stable, no new changes  All other systems were reviewed with the patient and are negative.  I have reviewed the past medical history, past surgical history, social history and family history with the patient and they are unchanged from previous note.  ALLERGIES:  is allergic to skin adhesives [cyanoacrylate], shrimp [shellfish allergy], and sulfonamide derivatives.  MEDICATIONS:  Current Outpatient Medications  Medication Sig Dispense Refill  . ALPRAZolam (XANAX) 0.25 MG tablet Take 0.25 mg by mouth at bedtime as needed for anxiety.    . carboxymethylcellulose (REFRESH PLUS) 0.5 % SOLN Place 1 drop into both eyes 2 (two) times daily as needed (dry eyes).    . Cholecalciferol (VITAMIN D3) 25 MCG (1000 UT) CAPS Take 1,000 Units by mouth daily.     . diphenhydrAMINE (BENADRYL) 25 mg capsule Take 25 mg by mouth at bedtime as needed for sleep.    [name] Kitchen esomeprazole (NEXIUM) 20 MG capsule Take 20 mg by mouth daily.     [name] Kitchen lidocaine-prilocaine (EMLA) cream Apply 1 application topically as needed.    . loratadine (CLARITIN) 10 MG tablet Take 10 mg by mouth daily as needed for allergies.    . Multiple Vitamin (MULTIVITAMIN WITH MINERALS) TABS tablet Take 1 tablet by mouth daily.    . ondansetron (ZOFRAN) 8 MG tablet Take 8 mg by mouth every 8 (eight) hours as needed.    . predniSONE (DELTASONE) 20 MG tablet Take 1 tablet (20  mg total) by mouth daily  with breakfast. 7 tablet 0  . prochlorperazine (COMPAZINE) 10 MG tablet Take 10 mg by mouth every 6 (six) hours as needed.     No current facility-administered medications for this visit.    PHYSICAL EXAMINATION: ECOG PERFORMANCE STATUS: 1 - Symptomatic but completely ambulatory  Vitals:   11/04/19 1205  BP: 136/86  Pulse: 79  Resp: 18  Temp: 98 F (36.7 C)  SpO2: 100%   Filed Weights   11/04/19 1205  Weight: 143 lb 6.4 oz (65 kg)    GENERAL:alert, no distress and comfortable SKIN: skin color, texture, turgor are normal, no rashes or significant lesions EYES: normal, Conjunctiva are pink and non-injected, sclera clear OROPHARYNX:no exudate, no erythema and lips, buccal mucosa, and tongue normal  NECK: supple, thyroid normal size, non-tender, without nodularity LYMPH:  no palpable lymphadenopathy in the cervical, axillary or inguinal LUNGS: clear to auscultation and percussion with normal breathing effort HEART: regular rate & rhythm and no murmurs and no lower extremity edema ABDOMEN:abdomen soft, non-tender and normal bowel sounds Musculoskeletal:no cyanosis of digits and no clubbing  NEURO: alert & oriented x 3 with fluent speech, no focal motor/sensory deficits  LABORATORY DATA:  I have reviewed the data as listed    Component Value Date/Time   NA 143 11/04/2019 1141   K 3.7 11/04/2019 1141   CL 109 11/04/2019 1141   CO2 26 11/04/2019 1141   GLUCOSE 94 11/04/2019 1141   BUN 12 11/04/2019 1141   CREATININE 0.63 11/04/2019 1141   CALCIUM 9.0 11/04/2019 1141   PROT 6.1 (L) 11/04/2019 1141   ALBUMIN 3.7 11/04/2019 1141   AST 37 11/04/2019 1141   ALT 58 (H) 11/04/2019 1141   ALKPHOS 82 11/04/2019 1141   BILITOT 0.8 11/04/2019 1141   GFRNONAA >60 11/04/2019 1141   GFRAA >60 11/04/2019 1141    No results found for: SPEP, UPEP  Lab Results  Component Value Date   WBC 4.2 11/04/2019   NEUTROABS 2.2 11/04/2019   HGB 11.7 (L) 11/04/2019   HCT 34.9 (L)  11/04/2019   MCV 95.1 11/04/2019   PLT 288 11/04/2019      Chemistry      Component Value Date/Time   NA 143 11/04/2019 1141   K 3.7 11/04/2019 1141   CL 109 11/04/2019 1141   CO2 26 11/04/2019 1141   BUN 12 11/04/2019 1141   CREATININE 0.63 11/04/2019 1141      Component Value Date/Time   CALCIUM 9.0 11/04/2019 1141   ALKPHOS 82 11/04/2019 1141   AST 37 11/04/2019 1141   ALT 58 (H) 11/04/2019 1141   BILITOT 0.8 11/04/2019 1141       RADIOGRAPHIC STUDIES: I have personally reviewed the radiological images as listed and agreed with the findings in the report. IR IMAGING GUIDED PORT INSERTION  Result Date: 10/10/2019 INDICATION: 64 year old female with a history of recurrent left ovarian epithelial cancer. She presents for placement of a port catheter for durable venous access. EXAM: IMPLANTED PORT A CATH PLACEMENT WITH ULTRASOUND AND FLUOROSCOPIC GUIDANCE MEDICATIONS: 2 g Ancef; The antibiotic was administered within an appropriate time interval prior to skin puncture. ANESTHESIA/SEDATION: Versed 2 mg IV; Fentanyl 100 mcg IV; Moderate Sedation Time:  17 minutes The patient was continuously monitored during the procedure by the interventional radiology nurse under my direct supervision. FLUOROSCOPY TIME:  0 minutes, 12 seconds (1 mGy) COMPLICATIONS: None immediate. PROCEDURE: The right neck and chest was prepped with chlorhexidine, and draped  in the usual sterile fashion using maximum barrier technique (cap and mask, sterile gown, sterile gloves, large sterile sheet, hand hygiene and cutaneous antiseptic). Local anesthesia was attained by infiltration with 1% lidocaine with epinephrine. Ultrasound demonstrated patency of the right internal jugular vein, and this was documented with an image. Under real-time ultrasound guidance, this vein was accessed with a 21 gauge micropuncture needle and image documentation was performed. A small dermatotomy was made at the access site with an 11  scalpel. A 0.018" wire was advanced into the SVC and the access needle exchanged for a 23F micropuncture vascular sheath. The 0.018" wire was then removed and a 0.035" wire advanced into the IVC. An appropriate location for the subcutaneous reservoir was selected below the clavicle and an incision was made through the skin and underlying soft tissues. The subcutaneous tissues were then dissected using a combination of blunt and sharp surgical technique and a pocket was formed. A single lumen power injectable portacatheter was then tunneled through the subcutaneous tissues from the pocket to the dermatotomy and the port reservoir placed within the subcutaneous pocket. The venous access site was then serially dilated and a peel away vascular sheath placed over the wire. The wire was removed and the port catheter advanced into position under fluoroscopic guidance. The catheter tip is positioned in the superior cavoatrial junction. This was documented with a spot image. The portacatheter was then tested and found to flush and aspirate well. The port was flushed with saline followed by 100 units/mL heparinized saline. The pocket was then closed in two layers using first subdermal inverted interrupted absorbable sutures followed by a running subcuticular suture. The epidermis was then sealed with Dermabond. The dermatotomy at the venous access site was also closed with Dermabond. IMPRESSION: Successful placement of a right IJ approach Power Port with ultrasound and fluoroscopic guidance. The catheter is ready for use. Electronically Signed   By: Jacqulynn Cadet M.D.   On: 10/10/2019 16:33

## 2019-11-05 NOTE — Assessment & Plan Note (Signed)
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

## 2019-11-05 NOTE — Assessment & Plan Note (Signed)

## 2019-11-05 NOTE — Progress Notes (Signed)
The patient was seen in infusion. She had completed her infusion with Gemzar and was receiving carboplatin when she developed nausea, GERD and diaphoresis. She received Aloxi, Benadryl, Decadron, Pepcid, and Emend. An EKG was completed which showed a normal sinus rhythm at 65 BPM. She was given Ativan 0.5 mg IV x 1 and a G.I. Cocktail. Her symptoms abated and she was able to complete the remainder of her carboplatin infusion without additional issues.  Sandi Mealy, MHS, PA-C Physician Assistant

## 2019-11-06 ENCOUNTER — Telehealth: Payer: Self-pay | Admitting: Hematology and Oncology

## 2019-11-06 NOTE — Telephone Encounter (Signed)
Scheduled per 6/15 sch message. Noted to give pt updated calendar.

## 2019-11-11 ENCOUNTER — Inpatient Hospital Stay: Payer: Managed Care, Other (non HMO)

## 2019-11-11 ENCOUNTER — Other Ambulatory Visit: Payer: Self-pay

## 2019-11-11 ENCOUNTER — Other Ambulatory Visit: Payer: Self-pay | Admitting: Hematology and Oncology

## 2019-11-11 VITALS — BP 129/81 | HR 74 | Temp 98.3°F | Resp 18

## 2019-11-11 DIAGNOSIS — C786 Secondary malignant neoplasm of retroperitoneum and peritoneum: Secondary | ICD-10-CM

## 2019-11-11 DIAGNOSIS — Z7189 Other specified counseling: Secondary | ICD-10-CM

## 2019-11-11 DIAGNOSIS — C562 Malignant neoplasm of left ovary: Secondary | ICD-10-CM

## 2019-11-11 DIAGNOSIS — Z5111 Encounter for antineoplastic chemotherapy: Secondary | ICD-10-CM | POA: Diagnosis not present

## 2019-11-11 LAB — CBC WITH DIFFERENTIAL (CANCER CENTER ONLY)
Abs Immature Granulocytes: 0.01 10*3/uL (ref 0.00–0.07)
Basophils Absolute: 0 10*3/uL (ref 0.0–0.1)
Basophils Relative: 1 %
Eosinophils Absolute: 0 10*3/uL (ref 0.0–0.5)
Eosinophils Relative: 1 %
HCT: 32.8 % — ABNORMAL LOW (ref 36.0–46.0)
Hemoglobin: 11.2 g/dL — ABNORMAL LOW (ref 12.0–15.0)
Immature Granulocytes: 1 %
Lymphocytes Relative: 65 %
Lymphs Abs: 1.3 10*3/uL (ref 0.7–4.0)
MCH: 31.5 pg (ref 26.0–34.0)
MCHC: 34.1 g/dL (ref 30.0–36.0)
MCV: 92.4 fL (ref 80.0–100.0)
Monocytes Absolute: 0.1 10*3/uL (ref 0.1–1.0)
Monocytes Relative: 4 %
Neutro Abs: 0.6 10*3/uL — ABNORMAL LOW (ref 1.7–7.7)
Neutrophils Relative %: 28 %
Platelet Count: 274 10*3/uL (ref 150–400)
RBC: 3.55 MIL/uL — ABNORMAL LOW (ref 3.87–5.11)
RDW: 12.5 % (ref 11.5–15.5)
WBC Count: 2 10*3/uL — ABNORMAL LOW (ref 4.0–10.5)
nRBC: 0 % (ref 0.0–0.2)

## 2019-11-11 LAB — CMP (CANCER CENTER ONLY)
ALT: 50 U/L — ABNORMAL HIGH (ref 0–44)
AST: 37 U/L (ref 15–41)
Albumin: 3.7 g/dL (ref 3.5–5.0)
Alkaline Phosphatase: 89 U/L (ref 38–126)
Anion gap: 12 (ref 5–15)
BUN: 10 mg/dL (ref 8–23)
CO2: 24 mmol/L (ref 22–32)
Calcium: 9.1 mg/dL (ref 8.9–10.3)
Chloride: 109 mmol/L (ref 98–111)
Creatinine: 0.65 mg/dL (ref 0.44–1.00)
GFR, Est AFR Am: >60 mL/min (ref >60)
GFR, Estimated: >60 mL/min (ref >60)
Glucose, Bld: 117 mg/dL — ABNORMAL HIGH (ref 70–99)
Potassium: 3.6 mmol/L (ref 3.5–5.1)
Sodium: 145 mmol/L (ref 135–145)
Total Bilirubin: 0.8 mg/dL (ref 0.3–1.2)
Total Protein: 6.4 g/dL — ABNORMAL LOW (ref 6.5–8.1)

## 2019-11-11 MED ORDER — PROCHLORPERAZINE MALEATE 10 MG PO TABS
10.0000 mg | ORAL_TABLET | Freq: Once | ORAL | Status: AC
  Administered 2019-11-11: 10 mg via ORAL

## 2019-11-11 MED ORDER — SODIUM CHLORIDE 0.9% FLUSH
10.0000 mL | INTRAVENOUS | Status: DC | PRN
  Administered 2019-11-11: 10 mL
  Filled 2019-11-11: qty 10

## 2019-11-11 MED ORDER — SODIUM CHLORIDE 0.9% FLUSH
10.0000 mL | Freq: Once | INTRAVENOUS | Status: AC
  Administered 2019-11-11: 10 mL
  Filled 2019-11-11: qty 10

## 2019-11-11 MED ORDER — HEPARIN SOD (PORK) LOCK FLUSH 100 UNIT/ML IV SOLN
500.0000 [IU] | Freq: Once | INTRAVENOUS | Status: AC | PRN
  Administered 2019-11-11: 500 [IU]
  Filled 2019-11-11: qty 5

## 2019-11-11 MED ORDER — PROCHLORPERAZINE MALEATE 10 MG PO TABS
ORAL_TABLET | ORAL | Status: AC
  Filled 2019-11-11: qty 1

## 2019-11-11 MED ORDER — SODIUM CHLORIDE 0.9 % IV SOLN
Freq: Once | INTRAVENOUS | Status: AC
  Filled 2019-11-11: qty 250

## 2019-11-11 MED ORDER — SODIUM CHLORIDE 0.9 % IV SOLN
600.0000 mg/m2 | Freq: Once | INTRAVENOUS | Status: AC
  Administered 2019-11-11: 1026 mg via INTRAVENOUS
  Filled 2019-11-11: qty 26.98

## 2019-11-11 NOTE — Patient Instructions (Signed)
[facility] Discharge Instructions for Patients Receiving Chemotherapy  Today you received the following chemotherapy agents gemzar.  To help prevent nausea and vomiting after your treatment, we encourage you to take your nausea medication as directed.   If you develop nausea and vomiting that is not controlled by your nausea medication, call the clinic.   BELOW ARE SYMPTOMS THAT SHOULD BE REPORTED IMMEDIATELY:  *FEVER GREATER THAN 100.5 F  *CHILLS WITH OR WITHOUT FEVER  NAUSEA AND VOMITING THAT IS NOT CONTROLLED WITH YOUR NAUSEA MEDICATION  *UNUSUAL SHORTNESS OF BREATH  *UNUSUAL BRUISING OR BLEEDING  TENDERNESS IN MOUTH AND THROAT WITH OR WITHOUT PRESENCE OF ULCERS  *URINARY PROBLEMS  *BOWEL PROBLEMS  UNUSUAL RASH Items with * indicate a potential emergency and should be followed up as soon as possible.  Feel free to call the clinic should you have any questions or concerns. The clinic phone number is [phone].  Please show the CHEMO ALERT CARD at check-in to the Emergency Department and triage nurse.   

## 2019-11-11 NOTE — Patient Instructions (Signed)

## 2019-11-18 ENCOUNTER — Other Ambulatory Visit: Payer: Managed Care, Other (non HMO)

## 2019-11-18 ENCOUNTER — Ambulatory Visit: Payer: Managed Care, Other (non HMO) | Admitting: Hematology and Oncology

## 2019-11-28 ENCOUNTER — Encounter: Payer: Self-pay | Admitting: Hematology and Oncology

## 2019-11-28 ENCOUNTER — Inpatient Hospital Stay (HOSPITAL_BASED_OUTPATIENT_CLINIC_OR_DEPARTMENT_OTHER): Payer: Managed Care, Other (non HMO) | Admitting: Hematology and Oncology

## 2019-11-28 ENCOUNTER — Inpatient Hospital Stay: Payer: Managed Care, Other (non HMO)

## 2019-11-28 ENCOUNTER — Telehealth: Payer: Self-pay | Admitting: Hematology and Oncology

## 2019-11-28 ENCOUNTER — Other Ambulatory Visit: Payer: Self-pay | Admitting: *Deleted

## 2019-11-28 ENCOUNTER — Inpatient Hospital Stay: Payer: Managed Care, Other (non HMO) | Attending: Gynecologic Oncology

## 2019-11-28 ENCOUNTER — Other Ambulatory Visit: Payer: Self-pay | Admitting: Medical

## 2019-11-28 ENCOUNTER — Ambulatory Visit (HOSPITAL_BASED_OUTPATIENT_CLINIC_OR_DEPARTMENT_OTHER): Payer: Managed Care, Other (non HMO) | Admitting: Medical

## 2019-11-28 ENCOUNTER — Other Ambulatory Visit: Payer: Self-pay

## 2019-11-28 VITALS — BP 133/93 | HR 82 | Temp 98.0°F | Resp 18

## 2019-11-28 VITALS — BP 146/82 | HR 81 | Temp 98.7°F | Resp 17 | Ht 62.0 in | Wt 144.4 lb

## 2019-11-28 DIAGNOSIS — D6481 Anemia due to antineoplastic chemotherapy: Secondary | ICD-10-CM

## 2019-11-28 DIAGNOSIS — C562 Malignant neoplasm of left ovary: Secondary | ICD-10-CM

## 2019-11-28 DIAGNOSIS — R748 Abnormal levels of other serum enzymes: Secondary | ICD-10-CM

## 2019-11-28 DIAGNOSIS — Z7189 Other specified counseling: Secondary | ICD-10-CM

## 2019-11-28 DIAGNOSIS — C786 Secondary malignant neoplasm of retroperitoneum and peritoneum: Secondary | ICD-10-CM

## 2019-11-28 DIAGNOSIS — R110 Nausea: Secondary | ICD-10-CM

## 2019-11-28 DIAGNOSIS — Z5111 Encounter for antineoplastic chemotherapy: Secondary | ICD-10-CM | POA: Insufficient documentation

## 2019-11-28 DIAGNOSIS — K219 Gastro-esophageal reflux disease without esophagitis: Secondary | ICD-10-CM

## 2019-11-28 DIAGNOSIS — C569 Malignant neoplasm of unspecified ovary: Secondary | ICD-10-CM

## 2019-11-28 DIAGNOSIS — T8090XA Unspecified complication following infusion and therapeutic injection, initial encounter: Secondary | ICD-10-CM

## 2019-11-28 DIAGNOSIS — T451X5A Adverse effect of antineoplastic and immunosuppressive drugs, initial encounter: Secondary | ICD-10-CM | POA: Diagnosis not present

## 2019-11-28 DIAGNOSIS — R079 Chest pain, unspecified: Secondary | ICD-10-CM

## 2019-11-28 LAB — CMP (CANCER CENTER ONLY)
ALT: 65 U/L — ABNORMAL HIGH (ref 0–44)
AST: 34 U/L (ref 15–41)
Albumin: 3.8 g/dL (ref 3.5–5.0)
Alkaline Phosphatase: 142 U/L — ABNORMAL HIGH (ref 38–126)
Anion gap: 11 (ref 5–15)
BUN: 11 mg/dL (ref 8–23)
CO2: 24 mmol/L (ref 22–32)
Calcium: 9.2 mg/dL (ref 8.9–10.3)
Chloride: 108 mmol/L (ref 98–111)
Creatinine: 0.65 mg/dL (ref 0.44–1.00)
GFR, Est AFR Am: >60 mL/min (ref >60)
GFR, Estimated: >60 mL/min (ref >60)
Glucose, Bld: 90 mg/dL (ref 70–99)
Potassium: 3.8 mmol/L (ref 3.5–5.1)
Sodium: 143 mmol/L (ref 135–145)
Total Bilirubin: 0.9 mg/dL (ref 0.3–1.2)
Total Protein: 6.5 g/dL (ref 6.5–8.1)

## 2019-11-28 LAB — CBC WITH DIFFERENTIAL (CANCER CENTER ONLY)
Abs Immature Granulocytes: 0.01 10*3/uL (ref 0.00–0.07)
Basophils Absolute: 0 10*3/uL (ref 0.0–0.1)
Basophils Relative: 1 %
Eosinophils Absolute: 0.1 10*3/uL (ref 0.0–0.5)
Eosinophils Relative: 1 %
HCT: 33.8 % — ABNORMAL LOW (ref 36.0–46.0)
Hemoglobin: 11.1 g/dL — ABNORMAL LOW (ref 12.0–15.0)
Immature Granulocytes: 0 %
Lymphocytes Relative: 31 %
Lymphs Abs: 1.4 10*3/uL (ref 0.7–4.0)
MCH: 31.7 pg (ref 26.0–34.0)
MCHC: 32.8 g/dL (ref 30.0–36.0)
MCV: 96.6 fL (ref 80.0–100.0)
Monocytes Absolute: 0.6 10*3/uL (ref 0.1–1.0)
Monocytes Relative: 13 %
Neutro Abs: 2.4 10*3/uL (ref 1.7–7.7)
Neutrophils Relative %: 54 %
Platelet Count: 346 10*3/uL (ref 150–400)
RBC: 3.5 MIL/uL — ABNORMAL LOW (ref 3.87–5.11)
RDW: 16.2 % — ABNORMAL HIGH (ref 11.5–15.5)
WBC Count: 4.4 10*3/uL (ref 4.0–10.5)
nRBC: 0 % (ref 0.0–0.2)

## 2019-11-28 MED ORDER — ALUM & MAG HYDROXIDE-SIMETH 200-200-20 MG/5ML PO SUSP
30.0000 mL | Freq: Once | ORAL | Status: AC
  Administered 2019-11-28: 30 mL via ORAL

## 2019-11-28 MED ORDER — FAMOTIDINE IN NACL 20-0.9 MG/50ML-% IV SOLN
20.0000 mg | Freq: Once | INTRAVENOUS | Status: AC
  Administered 2019-11-28: 20 mg via INTRAVENOUS

## 2019-11-28 MED ORDER — SODIUM CHLORIDE 0.9 % IV SOLN
Freq: Once | INTRAVENOUS | Status: AC
  Filled 2019-11-28: qty 250

## 2019-11-28 MED ORDER — SODIUM CHLORIDE 0.9% FLUSH
10.0000 mL | INTRAVENOUS | Status: DC | PRN
  Administered 2019-11-28: 10 mL
  Filled 2019-11-28: qty 10

## 2019-11-28 MED ORDER — LORAZEPAM 2 MG/ML IJ SOLN: 0.5000 mg | Freq: Once | INTRAMUSCULAR | Status: DC

## 2019-11-28 MED ORDER — DIPHENHYDRAMINE HCL 25 MG PO TABS
25.0000 mg | ORAL_TABLET | Freq: Once | ORAL | Status: AC
  Administered 2019-11-28: 25 mg via ORAL
  Filled 2019-11-28: qty 1

## 2019-11-28 MED ORDER — PALONOSETRON HCL INJECTION 0.25 MG/5ML
INTRAVENOUS | Status: AC
  Filled 2019-11-28: qty 5

## 2019-11-28 MED ORDER — FAMOTIDINE IN NACL 20-0.9 MG/50ML-% IV SOLN
INTRAVENOUS | Status: AC
  Filled 2019-11-28: qty 50

## 2019-11-28 MED ORDER — DIPHENHYDRAMINE HCL 50 MG/ML IJ SOLN
INTRAMUSCULAR | Status: AC
  Filled 2019-11-28: qty 1

## 2019-11-28 MED ORDER — PALONOSETRON HCL INJECTION 0.25 MG/5ML
0.2500 mg | Freq: Once | INTRAVENOUS | Status: AC
  Administered 2019-11-28: 0.25 mg via INTRAVENOUS

## 2019-11-28 MED ORDER — SODIUM CHLORIDE 0.9 % IV SOLN
600.0000 mg/m2 | Freq: Once | INTRAVENOUS | Status: AC
  Administered 2019-11-28: 1026 mg via INTRAVENOUS
  Filled 2019-11-28: qty 26.98

## 2019-11-28 MED ORDER — DIPHENHYDRAMINE HCL 25 MG PO CAPS
ORAL_CAPSULE | ORAL | Status: AC
  Filled 2019-11-28: qty 1

## 2019-11-28 MED ORDER — DIPHENHYDRAMINE HCL 50 MG/ML IJ SOLN
25.0000 mg | Freq: Once | INTRAMUSCULAR | Status: AC
  Administered 2019-11-28: 25 mg via INTRAVENOUS

## 2019-11-28 MED ORDER — LORAZEPAM 2 MG/ML IJ SOLN
INTRAMUSCULAR | Status: AC
  Filled 2019-11-28: qty 1

## 2019-11-28 MED ORDER — LORAZEPAM 2 MG/ML IJ SOLN
0.5000 mg | Freq: Once | INTRAMUSCULAR | Status: AC
  Administered 2019-11-28: 0.5 mg via INTRAVENOUS

## 2019-11-28 MED ORDER — SODIUM CHLORIDE 0.9 % IV SOLN
150.0000 mg | Freq: Once | INTRAVENOUS | Status: AC
  Administered 2019-11-28: 150 mg via INTRAVENOUS
  Filled 2019-11-28: qty 150

## 2019-11-28 MED ORDER — SODIUM CHLORIDE 0.9 % IV SOLN
10.0000 mg | Freq: Once | INTRAVENOUS | Status: AC
  Administered 2019-11-28: 10 mg via INTRAVENOUS
  Filled 2019-11-28: qty 10

## 2019-11-28 MED ORDER — SODIUM CHLORIDE 0.9 % IV SOLN
332.4000 mg | Freq: Once | INTRAVENOUS | Status: AC
  Administered 2019-11-28: 330 mg via INTRAVENOUS
  Filled 2019-11-28: qty 33

## 2019-11-28 MED ORDER — SODIUM CHLORIDE 0.9% FLUSH
10.0000 mL | Freq: Once | INTRAVENOUS | Status: AC
  Administered 2019-11-28: 10 mL
  Filled 2019-11-28: qty 10

## 2019-11-28 MED ORDER — HEPARIN SOD (PORK) LOCK FLUSH 100 UNIT/ML IV SOLN
500.0000 [IU] | Freq: Once | INTRAVENOUS | Status: AC | PRN
  Administered 2019-11-28: 500 [IU]
  Filled 2019-11-28: qty 5

## 2019-11-28 NOTE — Progress Notes (Signed)
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 The patient denies recent history of bleeding such as epistaxis, hematuria or hematochezia. She is asymptomatic from the anemia. I will observe for now.  She does not require transfusion now. I will continue the chemotherapy at current dose without dosage adjustment.  If the anemia gets progressive worse in the future, I might have to delay her treatment or adjust the chemotherapy dose.   Elevated liver enzymes She has intermittent elevated liver enzymes Could be due to chemo side effects Observe only for now   Orders Placed This Encounter  Procedures  . CT ABDOMEN PELVIS W CONTRAST    Standing Status:   Future    Standing Expiration Date:   11/27/2020    Order Specific Question:   If indicated for the ordered procedure, I authorize the administration of contrast media per Radiology protocol    Answer:   Yes    Order Specific Question:   Preferred imaging location?    Answer:   [facility]    Order Specific Question:   Radiology Contrast Protocol - do NOT remove file path    Answer:   _0 [email or web address]    All questions were answered. The patient knows to call the clinic with any problems, questions or concerns. The total time spent in the appointment was 25 minutes encounter with patients including review of chart and various tests results, discussions about plan of care and coordination of care plan    [name] Lark, MD 11/28/2019 1:48 PM  INTERVAL HISTORY: Please see below for problem oriented charting. 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 14.7 cm poorly defined soft tissue mass in central pelvis suspicious for primary ovarian carcinoma, with uterine leiomyosarcoma considered a less likely differential diagnosis. 2. Moderate ascites and diffuse peritoneal carcinomatosis. 3. Several small uterine fibroids.   11/13/2018 Tumor Marker   Patient's tumor was tested for the following markers: CA-125 Results of the tumor marker test revealed 1434   11/21/2018 Procedure   Successful ultrasound-guided diagnostic and therapeutic paracentesis yielding 3.1 liters of peritoneal fluid   11/21/2018 Pathology Results   PERITONEAL/ASCITIC FLUID(SPECIMEN 1 OF 1 COLLECTED 11/21/18): ADENOCARCINOMA. Specimen Clinical Information Pelvic mass suspicious for ovarian cancer Source Peritoneal/Ascitic Fluid, (specimen 1 of 1 collected 11/21/18) Gross Specimen: Received is/are 1000 ccs of dark amber fluid. (CM:cm) Prepared: # Smears: 0 # Concentration Technique Slides (i.e. 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   11/29/2018 Initial Diagnosis   Ovarian cancer ([place])   11/29/2018 Cancer Staging   Staging form: Ovary, Fallopian Tube, and Primary Peritoneal Carcinoma, AJCC 8th Edition - Clinical: cT3, cN0, cM0 - Signed by [name] Lark, MD on 11/29/2018  12/03/2018 Procedure   Successful ultrasound-guided therapeutic paracentesis yielding 3.7 liters of peritoneal fluid.     12/06/2018 Procedure   Placement of a  subcutaneous port device. Catheter tip at the SVC and right atrium junction   12/14/2018 Procedure   Successful ultrasound-guided paracentesis yielding 2.9 L of peritoneal fluid   12/28/2018 Tumor Marker   Patient's tumor was tested for the following markers: CA-125 Results of the tumor marker test revealed 812.   12/28/2018 - 04/22/2019 Chemotherapy   The patient had carboplatin and taxol for neoadjuvant treatment, followed by interval debulking surgery and subsequent adjuvant chemotherapy treatment.     02/01/2019 Imaging   Ct abdomen and pelvis 8.6 cm left ovarian mass, corresponding to the patient's known primary neoplasm, improved.   Mild peritoneal nodularity/omental caking, improved.   Small abdominopelvic ascites, improved.     02/01/2019 Tumor Marker   Patient's tumor was tested for the following markers: CA-125 Results of the tumor marker test revealed 183.   02/12/2019 Surgery   Preoperative Diagnosis: Stage IIIC ovarian cancer, s/p neoadjuvant chemotherapy      Procedure(s) Performed: 1. Exploratory laparotomy with total abdominal hysterectomy, bilateral salpingo-oophorectomy, omentectomy radical tumor debulking for ovarian cancer.   Surgeon: Emma C Rossi, MD.    Operative Findings: upper abdomen free of disease. No visible omental disease. Small volume (200cc) ascites. 8cm friable mass replacing left ovary and adherent to the sigmoid colon mesentery and ureter on the left.  Anterior fibroid. This represented an optimal cytoreduction (R0) with no gross visible disease remaining.    02/12/2019 Pathology Results   A. OVARY AND FALLOPIAN TUBE, LEFT, SALPINGO-OOPHORECTOMY: - Serous carcinoma, high grade, status post neoadjuvant therapy - See oncology table and comment below B. UTERUS CERVIX WITH RIGHT FALLOPIAN TUBE AND OVARY, HYSTERECTOMY: Uterus: - Serosal surface involved by serous carcinoma - Endomyometrium uninvolved by carcinoma - Benign endometrial polyp (4.1 cm) -  Leiomyomata (5.5 cm; largest) - Adenomyosis Cervix: - Uninvolved by carcinoma Left ovary: - Serous carcinoma, high grade Left fallopian tube: - Serous carcinoma, high grade C. SOFT TISSUE, LEFT PELVIC SIDEWALL TUMOR, EXCISION: - Metastatic serous carcinoma, high-grade D. SOFT TISSUE, SIGMOID COLON MESENTERY, EXCISION: - Metastatic serous carcinoma, high-grade E. OMENTUM, TUMOR RESECTION: - Metastatic serous carcinoma, high-grade OVARY or FALLOPIAN TUBE or PRIMARY PERITONEUM: Procedure: Total hysterectomy and bilateral salpingo-oophorectomy, omentectomy and peritoneal biopsies Specimen Integrity: Fragmented Tumor Site: Left ovary and fallopian tube Ovarian Surface Involvement: Present Fallopian Tube Surface Involvement: Present Tumor Size: 6.3 cm (see comment) Histologic Type: Serous carcinoma Histologic Grade: High-grade Implants: Not applicable Other Tissue/ Organ Involvement: Right ovary, right fallopian tube, omentum, mesentery Largest Extrapelvic Peritoneal Focus: Macroscopic Peritoneal/Ascitic Fluid: Malignant Treatment Effect: No definite or minimal response identified (chemotherapy response score 1 [CRS1] Regional Lymph Nodes: No lymph nodes submitted or found Pathologic Stage Classification (pTNM, AJCC 8th Edition): pT3c, pNX Representative Tumor Block: A4 Comment(s): Additional testing (HER-2, MMR and MSI) are pending. The primary tumor site appears to be the left ovary and fallopian tube. The uterus is only involved on the serosal surface. There is tumor on the anterior peritoneal reflection.  Addendum: Tumor is Her2 negative,MSI stable   02/19/2019 Tumor Marker   Patient's tumor was tested for the following markers: CA-125 Results of the tumor marker test revealed 40.3   04/01/2019 Tumor Marker   Patient's tumor was tested for the following markers: CA-125 Results of the tumor marker test revealed 8.1    Genetic Testing   Negative testing. No pathogenic variants  identified   on the [employer]. The report date is 04/17/2019.  Somatic genes analyzed through TumorNext-[facility]: ATM, BARD1, BRCA1, BRCA2, BRIP1, CHEK2, MRE11A, NBN, PALB2, RAD51C, RAD51D.  The CancerNext gene panel offered by [employer] includes sequencing and rearrangement analysis for the following 36 genes: APC*, ATM*, AXIN2, BARD1, BMPR1A, BRCA1*, BRCA2*, BRIP1*, CDH1*, CDK4, CDKN2A, CHEK2*, DICER1, MLH1*, MSH2*, MSH3, MSH6*, MUTYH*, NBN, NF1*, NTHL1, PALB2*, PMS2*, PTEN*, RAD51C*, RAD51D*, RECQL, SMAD4, SMARCA4, STK11 and TP53* (sequencing and deletion/duplication); HOXB13, POLD1 and POLE (sequencing only); EPCAM and GREM1 (deletion/duplication only).    05/21/2019 Imaging   1. Interval hysterectomy, oophorectomy and omentectomy. No evidence of residual ovarian carcinoma in the pelvis. No evidence of peritoneal disease. No intraperitoneal free fluid. 2. Nonobstructing LEFT renal calculi.  Normal ureters.   05/22/2019 Tumor Marker   Patient's tumor was tested for the following markers: CA-125 Results of the tumor marker test revealed 5.7.   06/03/2019 Procedure   Successful right IJ vein Port-A-Cath explant.   08/26/2019 Tumor Marker   Patient's tumor was tested for the following markers: CA-125. Results of the tumor marker test revealed 4.9   10/03/2019 Imaging   1. New tumor deposits along the capsular surfaces of the liver, spleen, in the left pelvis, and right paracolic gutter, compatible with metastatic disease. 2. Mild dilation of the dorsal pancreatic duct in the pancreatic body and head, cause uncertain. 3. Nonobstructive left nephrolithiasis. 4. Aortic atherosclerosis.   Aortic Atherosclerosis (ICD10-I70.0   10/10/2019 Procedure   Successful placement of a right IJ approach Power Port with ultrasound and fluoroscopic guidance. The catheter is ready for use.     10/11/2019 Tumor Marker   Patient's tumor was tested for the following markers:  CA-125 Results of the tumor marker test revealed 19.6.   10/14/2019 -  Chemotherapy   The patient had carboplatin and gemzar for chemotherapy treatment.     11/04/2019 Tumor Marker   Patient's tumor was tested for the following markers: CA-125 Results of the tumor marker test revealed 11.4     REVIEW OF SYSTEMS:   Constitutional: Denies fevers, chills or abnormal weight loss Eyes: Denies blurriness of vision Ears, nose, mouth, throat, and face: Denies mucositis or sore throat Respiratory: Denies cough, dyspnea or wheezes Cardiovascular: Denies palpitation, chest discomfort or lower extremity swelling Gastrointestinal:  Denies nausea, heartburn or change in bowel habits Skin: Denies abnormal skin rashes Lymphatics: Denies new lymphadenopathy or easy bruising Neurological:Denies numbness, tingling or new weaknesses Behavioral/Psych: Mood is stable, no new changes  All other systems were reviewed with the patient and are negative.  I have reviewed the past medical history, past surgical history, social history and family history with the patient and they are unchanged from previous note.  ALLERGIES:  is allergic to skin adhesives [cyanoacrylate], shrimp [shellfish allergy], and sulfonamide derivatives.  MEDICATIONS:  Current Outpatient Medications  Medication Sig Dispense Refill  . ALPRAZolam (XANAX) 0.25 MG tablet Take 0.25 mg by mouth at bedtime as needed for anxiety.    . carboxymethylcellulose (REFRESH PLUS) 0.5 % SOLN Place 1 drop into both eyes 2 (two) times daily as needed (dry eyes).    . Cholecalciferol (VITAMIN D3) 25 MCG (1000 UT) CAPS Take 1,000 Units by mouth daily.     . diphenhydrAMINE (BENADRYL) 25 mg capsule Take 25 mg by mouth at bedtime as needed for sleep.    [name] Kitchen esomeprazole (NEXIUM) 20 MG capsule Take 20 mg by mouth daily.     [name] Kitchen lidocaine-prilocaine (EMLA) cream Apply 1 application topically  as needed.    . loratadine (CLARITIN) 10 MG tablet Take 10 mg by mouth  daily as needed for allergies.    . Multiple Vitamin (MULTIVITAMIN WITH MINERALS) TABS tablet Take 1 tablet by mouth daily.    . ondansetron (ZOFRAN) 8 MG tablet Take 8 mg by mouth every 8 (eight) hours as needed.    . predniSONE (DELTASONE) 20 MG tablet Take 1 tablet (20 mg total) by mouth daily with breakfast. 7 tablet 0  . prochlorperazine (COMPAZINE) 10 MG tablet Take 10 mg by mouth every 6 (six) hours as needed.     No current facility-administered medications for this visit.    PHYSICAL EXAMINATION: ECOG PERFORMANCE STATUS: 1 - Symptomatic but completely ambulatory  Vitals:   11/28/19 1227  BP: (!) 146/82  Pulse: 81  Resp: 17  Temp: 98.7 F (37.1 C)  SpO2: 100%   Filed Weights   11/28/19 1227  Weight: 144 lb 6.4 oz (65.5 kg)    GENERAL:alert, no distress and comfortable SKIN: skin color, texture, turgor are normal, no rashes or significant lesions EYES: normal, Conjunctiva are pink and non-injected, sclera clear OROPHARYNX:no exudate, no erythema and lips, buccal mucosa, and tongue normal  NECK: supple, thyroid normal size, non-tender, without nodularity LYMPH:  no palpable lymphadenopathy in the cervical, axillary or inguinal LUNGS: clear to auscultation and percussion with normal breathing effort HEART: regular rate & rhythm and no murmurs and no lower extremity edema ABDOMEN:abdomen soft, non-tender and normal bowel sounds Musculoskeletal:no cyanosis of digits and no clubbing  NEURO: alert & oriented x 3 with fluent speech, no focal motor/sensory deficits  LABORATORY DATA:  I have reviewed the data as listed    Component Value Date/Time   NA 143 11/28/2019 1213   K 3.8 11/28/2019 1213   CL 108 11/28/2019 1213   CO2 24 11/28/2019 1213   GLUCOSE 90 11/28/2019 1213   BUN 11 11/28/2019 1213   CREATININE 0.65 11/28/2019 1213   CALCIUM 9.2 11/28/2019 1213   PROT 6.5 11/28/2019 1213   ALBUMIN 3.8 11/28/2019 1213   AST 34 11/28/2019 1213   ALT 65 (H) 11/28/2019  1213   ALKPHOS 142 (H) 11/28/2019 1213   BILITOT 0.9 11/28/2019 1213   GFRNONAA >60 11/28/2019 1213   GFRAA >60 11/28/2019 1213    No results found for: SPEP, UPEP  Lab Results  Component Value Date   WBC 4.4 11/28/2019   NEUTROABS 2.4 11/28/2019   HGB 11.1 (L) 11/28/2019   HCT 33.8 (L) 11/28/2019   MCV 96.6 11/28/2019   PLT 346 11/28/2019      Chemistry      Component Value Date/Time   NA 143 11/28/2019 1213   K 3.8 11/28/2019 1213   CL 108 11/28/2019 1213   CO2 24 11/28/2019 1213   BUN 11 11/28/2019 1213   CREATININE 0.65 11/28/2019 1213      Component Value Date/Time   CALCIUM 9.2 11/28/2019 1213   ALKPHOS 142 (H) 11/28/2019 1213   AST 34 11/28/2019 1213   ALT 65 (H) 11/28/2019 1213   BILITOT 0.9 11/28/2019 1213       

## 2019-11-28 NOTE — Assessment & Plan Note (Signed)
She tolerated treatment well except for mild intermittent right upper quadrant discomfort Tumor marker is stable/improved I recommend repeat CT imaging study at the end of the month for objective assessment of response to therapy

## 2019-11-28 NOTE — Telephone Encounter (Signed)
Scheduled appts per 7/8 sch msg. Gave pt a print out of AVS.

## 2019-11-28 NOTE — Assessment & Plan Note (Signed)

## 2019-11-28 NOTE — Progress Notes (Signed)
Pt reported feeling weird @ 1640 & then felt nauseated.  BP 158/92,P 109, O2 sat 99. Temp,98.4. No vomiting. Carbo paused & notified Sandi Mealy PA.  Lorazepam given @ 16 47, Benadryl 25 mg given IV @ 1700 & Mylanta given 1711 per Van's orders.  Pt felt better after all these meds & was able to complete [place] without further problems.

## 2019-11-28 NOTE — Assessment & Plan Note (Signed)
She has intermittent elevated liver enzymes Could be due to chemo side effects Observe only for now

## 2019-11-28 NOTE — Patient Instructions (Signed)
[facility] Discharge Instructions for Patients Receiving Chemotherapy  Today you received the following chemotherapy agents:  Gemcitabine & Carboplatin  To help prevent nausea and vomiting after your treatment, we encourage you to take your nausea medication as prescribed. If you develop nausea and vomiting that is not controlled by your nausea medication, call the clinic.   BELOW ARE SYMPTOMS THAT SHOULD BE REPORTED IMMEDIATELY:  *FEVER GREATER THAN 100.5 F  *CHILLS WITH OR WITHOUT FEVER  NAUSEA AND VOMITING THAT IS NOT CONTROLLED WITH YOUR NAUSEA MEDICATION  *UNUSUAL SHORTNESS OF BREATH  *UNUSUAL BRUISING OR BLEEDING  TENDERNESS IN MOUTH AND THROAT WITH OR WITHOUT PRESENCE OF ULCERS  *URINARY PROBLEMS  *BOWEL PROBLEMS  UNUSUAL RASH Items with * indicate a potential emergency and should be followed up as soon as possible.  Feel free to call the clinic should you have any questions or concerns. The clinic phone number is [phone].  Please show the [place] at check-in to the Emergency Department and triage nurse.

## 2019-11-29 ENCOUNTER — Telehealth: Payer: Self-pay

## 2019-11-29 LAB — CA 125: Cancer Antigen (CA) 125: 5.8 U/mL (ref 0.0–38.1)

## 2019-11-29 NOTE — Telephone Encounter (Signed)
-----   Message from [name] Lark, MD sent at 11/29/2019  8:44 AM EDT ----- Regarding: pls let her know CA-125 is good

## 2019-11-29 NOTE — Progress Notes (Signed)
DATE:  11/28/2019                                          X  CHEMO/IMMUNOTHERAPY REACTION          MD:  Dr. [name] Lark   AGENT/BLOOD PRODUCT RECEIVING TODAY:               Carboplatin and gemcitabine   AGENT/BLOOD PRODUCT RECEIVING IMMEDIATELY PRIOR TO REACTION:           Carboplatin   VS: BP:     138/92   P:       109       SPO2:       99% on room air  T: 98.4                  REACTION(S):            Nausea, unsettled stomach, stomach pressure, chest pressure and GERD   PREMEDS:      Pepcid 20 mg, Benadryl 25 mg, Aloxi, dexamethasone 10 mg, and Emend   INTERVENTION: Carboplatin was paused and the patient was given Ativan 0.5 mg IV x1, Benadryl 25 mg IV x1, and Maalox.   Review of Systems  Review of Systems  Constitutional: Negative for chills, diaphoresis and fever.  HENT: Negative for trouble swallowing and voice change.   Respiratory: Negative for cough, chest tightness, shortness of breath and wheezing.   Cardiovascular: Negative for chest pain and palpitations.       Chest pressure  Gastrointestinal: Positive for nausea. Negative for abdominal pain, constipation, diarrhea and vomiting.       Abdominal pressure and GERD  Musculoskeletal: Negative for back pain and myalgias.  Neurological: Negative for dizziness, light-headedness and headaches.     Physical Exam  Physical Exam Constitutional:      General: She is not in acute distress.    Appearance: She is not diaphoretic.  HENT:     Head: Normocephalic and atraumatic.  Eyes:     General: No scleral icterus.       Right eye: No discharge.        Left eye: No discharge.     Conjunctiva/sclera: Conjunctivae normal.  Cardiovascular:     Rate and Rhythm: Normal rate and regular rhythm.     Heart sounds: Normal heart sounds. No murmur heard.  No friction rub. No gallop.   Pulmonary:     Effort: Pulmonary effort is normal. No respiratory distress.     Breath sounds: Normal breath sounds. No wheezing or rales.    Abdominal:     General: There is no distension.  Skin:    General: Skin is warm and dry.     Findings: No erythema or rash.  Neurological:     Mental Status: She is alert.  Psychiatric:        Mood and Affect: Mood normal.        Behavior: Behavior normal.        Thought Content: Thought content normal.        Judgment: Judgment normal.     OUTCOME:                 Her symptoms abated after she was given the above interventions.  She was able to restart and complete her carboplatin and gemcitabine without any further issues of concern.  Sandi Mealy, MHS, PA-C

## 2019-11-29 NOTE — Telephone Encounter (Signed)
Called and left below message. Ask her to call the office for questions. ?

## 2019-12-05 ENCOUNTER — Inpatient Hospital Stay: Payer: Managed Care, Other (non HMO)

## 2019-12-05 ENCOUNTER — Other Ambulatory Visit: Payer: Self-pay

## 2019-12-05 VITALS — BP 129/76 | HR 70 | Temp 98.5°F | Resp 17 | Wt 141.2 lb

## 2019-12-05 DIAGNOSIS — Z7189 Other specified counseling: Secondary | ICD-10-CM

## 2019-12-05 DIAGNOSIS — C562 Malignant neoplasm of left ovary: Secondary | ICD-10-CM

## 2019-12-05 DIAGNOSIS — C786 Secondary malignant neoplasm of retroperitoneum and peritoneum: Secondary | ICD-10-CM

## 2019-12-05 DIAGNOSIS — Z5111 Encounter for antineoplastic chemotherapy: Secondary | ICD-10-CM | POA: Diagnosis not present

## 2019-12-05 LAB — CBC WITH DIFFERENTIAL (CANCER CENTER ONLY)
Abs Immature Granulocytes: 0.01 10*3/uL (ref 0.00–0.07)
Basophils Absolute: 0 10*3/uL (ref 0.0–0.1)
Basophils Relative: 1 %
Eosinophils Absolute: 0 10*3/uL (ref 0.0–0.5)
Eosinophils Relative: 1 %
HCT: 30.9 % — ABNORMAL LOW (ref 36.0–46.0)
Hemoglobin: 10.7 g/dL — ABNORMAL LOW (ref 12.0–15.0)
Immature Granulocytes: 0 %
Lymphocytes Relative: 52 %
Lymphs Abs: 1.2 10*3/uL (ref 0.7–4.0)
MCH: 33.2 pg (ref 26.0–34.0)
MCHC: 34.6 g/dL (ref 30.0–36.0)
MCV: 96 fL (ref 80.0–100.0)
Monocytes Absolute: 0.1 10*3/uL (ref 0.1–1.0)
Monocytes Relative: 4 %
Neutro Abs: 1 10*3/uL — ABNORMAL LOW (ref 1.7–7.7)
Neutrophils Relative %: 42 %
Platelet Count: 248 10*3/uL (ref 150–400)
RBC: 3.22 MIL/uL — ABNORMAL LOW (ref 3.87–5.11)
RDW: 14.5 % (ref 11.5–15.5)
WBC Count: 2.4 10*3/uL — ABNORMAL LOW (ref 4.0–10.5)
nRBC: 0 % (ref 0.0–0.2)

## 2019-12-05 LAB — CMP (CANCER CENTER ONLY)
ALT: 46 U/L — ABNORMAL HIGH (ref 0–44)
AST: 34 U/L (ref 15–41)
Albumin: 3.6 g/dL (ref 3.5–5.0)
Alkaline Phosphatase: 91 U/L (ref 38–126)
Anion gap: 8 (ref 5–15)
BUN: 12 mg/dL (ref 8–23)
CO2: 25 mmol/L (ref 22–32)
Calcium: 9.4 mg/dL (ref 8.9–10.3)
Chloride: 109 mmol/L (ref 98–111)
Creatinine: 0.61 mg/dL (ref 0.44–1.00)
GFR, Est AFR Am: >60 mL/min (ref >60)
GFR, Estimated: >60 mL/min (ref >60)
Glucose, Bld: 95 mg/dL (ref 70–99)
Potassium: 3.9 mmol/L (ref 3.5–5.1)
Sodium: 142 mmol/L (ref 135–145)
Total Bilirubin: 0.8 mg/dL (ref 0.3–1.2)
Total Protein: 6.2 g/dL — ABNORMAL LOW (ref 6.5–8.1)

## 2019-12-05 MED ORDER — SODIUM CHLORIDE 0.9% FLUSH
10.0000 mL | INTRAVENOUS | Status: DC | PRN
  Administered 2019-12-05: 10 mL
  Filled 2019-12-05: qty 10

## 2019-12-05 MED ORDER — SODIUM CHLORIDE 0.9 % IV SOLN
Freq: Once | INTRAVENOUS | Status: AC
  Filled 2019-12-05: qty 250

## 2019-12-05 MED ORDER — SODIUM CHLORIDE 0.9 % IV SOLN
600.0000 mg/m2 | Freq: Once | INTRAVENOUS | Status: AC
  Administered 2019-12-05: 1026 mg via INTRAVENOUS
  Filled 2019-12-05: qty 26.98

## 2019-12-05 MED ORDER — SODIUM CHLORIDE 0.9% FLUSH
10.0000 mL | Freq: Once | INTRAVENOUS | Status: AC
  Administered 2019-12-05: 10 mL
  Filled 2019-12-05: qty 10

## 2019-12-05 MED ORDER — HEPARIN SOD (PORK) LOCK FLUSH 100 UNIT/ML IV SOLN
500.0000 [IU] | Freq: Once | INTRAVENOUS | Status: AC | PRN
  Administered 2019-12-05: 500 [IU]
  Filled 2019-12-05: qty 5

## 2019-12-05 MED ORDER — PROCHLORPERAZINE MALEATE 10 MG PO TABS
10.0000 mg | ORAL_TABLET | Freq: Once | ORAL | Status: AC
  Administered 2019-12-05: 10 mg via ORAL

## 2019-12-05 MED ORDER — PROCHLORPERAZINE MALEATE 10 MG PO TABS
ORAL_TABLET | ORAL | Status: AC
  Filled 2019-12-05: qty 1

## 2019-12-05 NOTE — Progress Notes (Signed)
Okay to treat with pancytopenia per Dr. Alvy Bimler. Patient advised on neutropenic precautions.

## 2019-12-05 NOTE — Patient Instructions (Signed)
[facility] °Discharge Instructions for Patients Receiving Chemotherapy ° °Today you received the following chemotherapy agents Gemzar ° °To help prevent nausea and vomiting after your treatment, we encourage you to take your nausea medication as directed. °  °If you develop nausea and vomiting that is not controlled by your nausea medication, call the clinic.  ° °BELOW ARE SYMPTOMS THAT SHOULD BE REPORTED IMMEDIATELY: °· *FEVER GREATER THAN 100.5 F °· *CHILLS WITH OR WITHOUT FEVER °· NAUSEA AND VOMITING THAT IS NOT CONTROLLED WITH YOUR NAUSEA MEDICATION °· *UNUSUAL SHORTNESS OF BREATH °· *UNUSUAL BRUISING OR BLEEDING °· TENDERNESS IN MOUTH AND THROAT WITH OR WITHOUT PRESENCE OF ULCERS °· *URINARY PROBLEMS °· *BOWEL PROBLEMS °· UNUSUAL RASH °Items with * indicate a potential emergency and should be followed up as soon as possible. ° °Feel free to call the clinic should you have any questions or concerns. The clinic phone number is [phone]. ° °Please show the CHEMO ALERT CARD at check-in to the Emergency Department and triage nurse. ° ° °

## 2019-12-20 ENCOUNTER — Ambulatory Visit (HOSPITAL_COMMUNITY)
Admission: RE | Admit: 2019-12-20 | Discharge: 2019-12-20 | Disposition: A | Discharge status: Home / Self Care | Payer: Managed Care, Other (non HMO) | Source: Ambulatory Visit | Attending: Hematology and Oncology | Admitting: Hematology and Oncology

## 2019-12-20 ENCOUNTER — Encounter (HOSPITAL_COMMUNITY): Payer: Self-pay

## 2019-12-20 ENCOUNTER — Other Ambulatory Visit: Payer: Self-pay

## 2019-12-20 DIAGNOSIS — C562 Malignant neoplasm of left ovary: Secondary | ICD-10-CM | POA: Diagnosis not present

## 2019-12-20 MED ORDER — SODIUM CHLORIDE (PF) 0.9 % IJ SOLN
INTRAMUSCULAR | Status: AC
  Filled 2019-12-20: qty 50

## 2019-12-20 MED ORDER — IOHEXOL 300 MG/ML  SOLN
100.0000 mL | Freq: Once | INTRAMUSCULAR | Status: AC | PRN
  Administered 2019-12-20: 100 mL via INTRAVENOUS

## 2019-12-20 MED FILL — Dexamethasone Sodium Phosphate Inj 100 MG/10ML: INTRAMUSCULAR | Qty: 1 | Status: AC

## 2019-12-20 MED FILL — Fosaprepitant Dimeglumine For IV Infusion 150 MG (Base Eq): INTRAVENOUS | Qty: 5 | Status: AC

## 2019-12-23 ENCOUNTER — Other Ambulatory Visit: Payer: Self-pay | Admitting: Hematology and Oncology

## 2019-12-23 ENCOUNTER — Inpatient Hospital Stay: Payer: Managed Care, Other (non HMO)

## 2019-12-23 ENCOUNTER — Inpatient Hospital Stay: Payer: Managed Care, Other (non HMO) | Attending: Gynecologic Oncology

## 2019-12-23 ENCOUNTER — Encounter: Payer: Self-pay | Admitting: Hematology and Oncology

## 2019-12-23 ENCOUNTER — Telehealth: Payer: Self-pay | Admitting: Hematology and Oncology

## 2019-12-23 ENCOUNTER — Other Ambulatory Visit: Payer: Self-pay

## 2019-12-23 ENCOUNTER — Inpatient Hospital Stay (HOSPITAL_BASED_OUTPATIENT_CLINIC_OR_DEPARTMENT_OTHER): Payer: Managed Care, Other (non HMO) | Admitting: Hematology and Oncology

## 2019-12-23 DIAGNOSIS — R748 Abnormal levels of other serum enzymes: Secondary | ICD-10-CM

## 2019-12-23 DIAGNOSIS — D6481 Anemia due to antineoplastic chemotherapy: Secondary | ICD-10-CM | POA: Insufficient documentation

## 2019-12-23 DIAGNOSIS — Z7189 Other specified counseling: Secondary | ICD-10-CM

## 2019-12-23 DIAGNOSIS — C562 Malignant neoplasm of left ovary: Secondary | ICD-10-CM

## 2019-12-23 DIAGNOSIS — T451X5A Adverse effect of antineoplastic and immunosuppressive drugs, initial encounter: Secondary | ICD-10-CM

## 2019-12-23 DIAGNOSIS — Z5111 Encounter for antineoplastic chemotherapy: Secondary | ICD-10-CM | POA: Diagnosis not present

## 2019-12-23 DIAGNOSIS — C786 Secondary malignant neoplasm of retroperitoneum and peritoneum: Secondary | ICD-10-CM | POA: Insufficient documentation

## 2019-12-23 DIAGNOSIS — C569 Malignant neoplasm of unspecified ovary: Secondary | ICD-10-CM

## 2019-12-23 LAB — CBC WITH DIFFERENTIAL (CANCER CENTER ONLY)
Abs Immature Granulocytes: 0.01 10*3/uL (ref 0.00–0.07)
Basophils Absolute: 0 10*3/uL (ref 0.0–0.1)
Basophils Relative: 0 %
Eosinophils Absolute: 0.1 10*3/uL (ref 0.0–0.5)
Eosinophils Relative: 2 %
HCT: 33.7 % — ABNORMAL LOW (ref 36.0–46.0)
Hemoglobin: 11.3 g/dL — ABNORMAL LOW (ref 12.0–15.0)
Immature Granulocytes: 0 %
Lymphocytes Relative: 21 %
Lymphs Abs: 1.2 10*3/uL (ref 0.7–4.0)
MCH: 33 pg (ref 26.0–34.0)
MCHC: 33.5 g/dL (ref 30.0–36.0)
MCV: 98.5 fL (ref 80.0–100.0)
Monocytes Absolute: 0.6 10*3/uL (ref 0.1–1.0)
Monocytes Relative: 10 %
Neutro Abs: 3.6 10*3/uL (ref 1.7–7.7)
Neutrophils Relative %: 67 %
Platelet Count: 373 10*3/uL (ref 150–400)
RBC: 3.42 MIL/uL — ABNORMAL LOW (ref 3.87–5.11)
RDW: 15.4 % (ref 11.5–15.5)
WBC Count: 5.4 10*3/uL (ref 4.0–10.5)
nRBC: 0 % (ref 0.0–0.2)

## 2019-12-23 LAB — CMP (CANCER CENTER ONLY)
ALT: 63 U/L — ABNORMAL HIGH (ref 0–44)
AST: 40 U/L (ref 15–41)
Albumin: 3.8 g/dL (ref 3.5–5.0)
Alkaline Phosphatase: 114 U/L (ref 38–126)
Anion gap: 6 (ref 5–15)
BUN: 16 mg/dL (ref 8–23)
CO2: 25 mmol/L (ref 22–32)
Calcium: 9.7 mg/dL (ref 8.9–10.3)
Chloride: 108 mmol/L (ref 98–111)
Creatinine: 0.63 mg/dL (ref 0.44–1.00)
GFR, Est AFR Am: >60 mL/min (ref >60)
GFR, Estimated: >60 mL/min (ref >60)
Glucose, Bld: 91 mg/dL (ref 70–99)
Potassium: 4 mmol/L (ref 3.5–5.1)
Sodium: 139 mmol/L (ref 135–145)
Total Bilirubin: 0.8 mg/dL (ref 0.3–1.2)
Total Protein: 6.4 g/dL — ABNORMAL LOW (ref 6.5–8.1)

## 2019-12-23 MED ORDER — HEPARIN SOD (PORK) LOCK FLUSH 100 UNIT/ML IV SOLN
500.0000 [IU] | Freq: Once | INTRAVENOUS | Status: AC | PRN
  Administered 2019-12-23: 500 [IU]
  Filled 2019-12-23: qty 5

## 2019-12-23 MED ORDER — SODIUM CHLORIDE 0.9% FLUSH
10.0000 mL | Freq: Once | INTRAVENOUS | Status: AC
  Administered 2019-12-23: 10 mL
  Filled 2019-12-23: qty 10

## 2019-12-23 MED ORDER — SODIUM CHLORIDE 0.9 % IV SOLN
600.0000 mg/m2 | Freq: Once | INTRAVENOUS | Status: AC
  Administered 2019-12-23: 1026 mg via INTRAVENOUS
  Filled 2019-12-23: qty 26.98

## 2019-12-23 MED ORDER — LIDOCAINE-PRILOCAINE 2.5-2.5 % EX CREA: 1.0000 "application " | TOPICAL_CREAM | CUTANEOUS | 11 refills | Status: DC | PRN

## 2019-12-23 MED ORDER — SODIUM CHLORIDE 0.9% FLUSH
10.0000 mL | INTRAVENOUS | Status: DC | PRN
  Administered 2019-12-23: 10 mL
  Filled 2019-12-23: qty 10

## 2019-12-23 MED ORDER — PROCHLORPERAZINE MALEATE 10 MG PO TABS
ORAL_TABLET | ORAL | Status: AC
  Filled 2019-12-23: qty 1

## 2019-12-23 MED ORDER — SODIUM CHLORIDE 0.9 % IV SOLN
Freq: Once | INTRAVENOUS | Status: AC
  Filled 2019-12-23: qty 250

## 2019-12-23 MED ORDER — PROCHLORPERAZINE MALEATE 10 MG PO TABS
10.0000 mg | ORAL_TABLET | Freq: Once | ORAL | Status: AC
  Administered 2019-12-23: 10 mg via ORAL

## 2019-12-23 MED FILL — LIDOCAINE-PRILOCAINE 2.5-2.: 2.5-2.5 | 30 days supply | Qty: 30 | Fill #0

## 2019-12-23 NOTE — Telephone Encounter (Signed)
Scheduled appts per 8/2 sch msg. Gave pt a print out of AVS.  

## 2019-12-23 NOTE — Progress Notes (Signed)
[facility] OFFICE PROGRESS NOTE  Patient Care Team: Geoffry Paradise, MD as PCP - General (Internal Medicine) Paulina Fusi Servando Snare, RN as Oncology Nurse Navigator (Oncology)  ASSESSMENT & PLAN:  Left ovarian epithelial cancer Stacy Rivas) I have reviewed her recent blood work and imaging studies The patient have excellent response to treatment with near complete response She is having some intermittent reaction to carboplatin I recommend discontinuation of carboplatin and focus on gemcitabine, single agent as maintenance treatment She is in agreement to proceed For now, I plan to keep her gemcitabine to day 1, day 8, rest day 15, for cycle of every 21 days I will see her back in 3 weeks for further follow-up  Anemia due to antineoplastic chemotherapy This is likely due to recent treatment. The patient denies recent history of bleeding such as epistaxis, hematuria or hematochezia. She is asymptomatic from the anemia. I will observe for now.  She does not require transfusion now. I will continue the chemotherapy at current dose without dosage adjustment.  If the anemia gets progressive worse in the future, I might have to delay her treatment or adjust the chemotherapy dose.   Elevated liver enzymes She has intermittent elevated liver enzymes Could be due to chemo side effects versus fatty liver CT imaging show near complete resolution of liver lesions Observe only for now  Goals of care, counseling/discussion We have extensive discussions about goals of care The patient is interested for preventive strategies to maximize her longevity and outcome We have discussion about weight loss strategies and integrating exercise on a regular basis We will revisit this issue in her next visit   No orders of the defined types were placed in this encounter.   All questions were answered. The patient knows to call the clinic with any problems, questions or concerns. The total time spent in the  appointment was 30 minutes encounter with patients including review of chart and various tests results, discussions about plan of care and coordination of care plan   Artis Delay, MD 12/23/2019 5:32 PM  INTERVAL HISTORY: Please see below for problem oriented charting. 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 14.7 cm poorly defined soft tissue mass in central pelvis suspicious for primary ovarian carcinoma, with uterine leiomyosarcoma considered a less likely differential diagnosis. 2. Moderate ascites and diffuse peritoneal carcinomatosis. 3. Several small uterine fibroids.   11/13/2018 Tumor Marker   Patient's tumor was tested for the following markers: CA-125 Results of the tumor marker test revealed 1434   11/21/2018 Procedure   Successful ultrasound-guided diagnostic and therapeutic paracentesis yielding 3.1 liters of peritoneal fluid   11/21/2018 Pathology Results   PERITONEAL/ASCITIC FLUID(SPECIMEN 1 OF 1 COLLECTED 11/21/18): ADENOCARCINOMA. Specimen Clinical Information Pelvic mass suspicious for ovarian cancer Source Peritoneal/Ascitic Fluid, (specimen 1 of 1 collected 11/21/18) Gross Specimen: Received is/are 1000 ccs of dark amber fluid. (CM:cm) Prepared: # Smears: 0 # Concentration Technique Slides (i.e. 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   11/29/2018 Initial Diagnosis   Ovarian cancer (HCC)   11/29/2018 Cancer Staging   Staging form: Ovary, Fallopian Tube, and Primary Peritoneal Carcinoma, AJCC  8th Edition - Clinical: cT3, cN0, cM0 - Signed by Artis Delay, MD on  11/29/2018   12/03/2018 Procedure   Successful ultrasound-guided therapeutic paracentesis yielding 3.7 liters of peritoneal fluid.     12/06/2018 Procedure   Placement of a subcutaneous port device. Catheter tip at the SVC and right atrium junction   12/14/2018 Procedure   Successful ultrasound-guided paracentesis yielding 2.9 L of peritoneal fluid   12/28/2018 Tumor Marker   Patient's tumor was tested for the following markers: CA-125 Results of the tumor marker test revealed 812.   12/28/2018 - 04/22/2019 Chemotherapy   The patient had carboplatin and taxol for neoadjuvant treatment, followed by interval debulking surgery and subsequent adjuvant chemotherapy treatment.     02/01/2019 Imaging   Ct abdomen and pelvis 8.6 cm left ovarian mass, corresponding to the patient's known primary neoplasm, improved.   Mild peritoneal nodularity/omental caking, improved.   Small abdominopelvic ascites, improved.     02/01/2019 Tumor Marker   Patient's tumor was tested for the following markers: CA-125 Results of the tumor marker test revealed 183.   02/12/2019 Surgery   Preoperative Diagnosis: Stage IIIC ovarian cancer, s/p neoadjuvant chemotherapy      Procedure(s) Performed: 1. Exploratory laparotomy with total abdominal hysterectomy, bilateral salpingo-oophorectomy, omentectomy radical tumor debulking for ovarian cancer.   Surgeon: Luisa Dago, MD.    Operative Findings: upper abdomen free of disease. No visible omental disease. Small volume (200cc) ascites. 8cm friable mass replacing left ovary and adherent to the sigmoid colon mesentery and ureter on the left.  Anterior fibroid. This represented an optimal cytoreduction (R0) with no gross visible disease remaining.    02/12/2019 Pathology Results   A. OVARY AND FALLOPIAN TUBE, LEFT, SALPINGO-OOPHORECTOMY: - Serous carcinoma, high grade, status post neoadjuvant therapy - See oncology table and comment below B. UTERUS CERVIX WITH RIGHT  FALLOPIAN TUBE AND OVARY, HYSTERECTOMY: Uterus: - Serosal surface involved by serous carcinoma - Endomyometrium uninvolved by carcinoma - Benign endometrial polyp (4.1 cm) - Leiomyomata (5.5 cm; largest) - Adenomyosis Cervix: - Uninvolved by carcinoma Left ovary: - Serous carcinoma, high grade Left fallopian tube: - Serous carcinoma, high grade C. SOFT TISSUE, LEFT PELVIC SIDEWALL TUMOR, EXCISION: - Metastatic serous carcinoma, high-grade D. SOFT TISSUE, SIGMOID COLON MESENTERY, EXCISION: - Metastatic serous carcinoma, high-grade E. OMENTUM, TUMOR RESECTION: - Metastatic serous carcinoma, high-grade OVARY or FALLOPIAN TUBE or PRIMARY PERITONEUM: Procedure: Total hysterectomy and bilateral salpingo-oophorectomy, omentectomy and peritoneal biopsies Specimen Integrity: Fragmented Tumor Site: Left ovary and fallopian tube Ovarian Surface Involvement: Present Fallopian Tube Surface Involvement: Present Tumor Size: 6.3 cm (see comment) Histologic Type: Serous carcinoma Histologic Grade: High-grade Implants: Not applicable Other Tissue/ Organ Involvement: Right ovary, right fallopian tube, omentum, mesentery Largest Extrapelvic Peritoneal Focus: Macroscopic Peritoneal/Ascitic Fluid: Malignant Treatment Effect: No definite or minimal response identified (chemotherapy response score 1 [CRS1] Regional Lymph Nodes: No lymph nodes submitted or found Pathologic Stage Classification (pTNM, AJCC 8th Edition): pT3c, pNX Representative Tumor Block: A4 Comment(s): Additional testing (HER-2, MMR and MSI) are pending. The primary tumor site appears to be the left ovary and fallopian tube. The uterus is only involved on the serosal surface. There is tumor on the anterior peritoneal reflection.  Addendum: Tumor is Her2 negative,MSI stable   02/19/2019 Tumor Marker   Patient's tumor was tested for the following markers: CA-125 Results of the tumor marker test revealed 40.3   04/01/2019 Tumor Marker    Patient's tumor was tested for the following markers: CA-125 Results of the tumor  marker test revealed 8.1    Genetic Testing   Negative testing. No pathogenic variants identified on the [employer]. The report date is 04/17/2019.  Somatic genes analyzed through [facility]: ATM, BARD1, BRCA1, BRCA2, BRIP1, CHEK2, MRE11A, NBN, PALB2, RAD51C, RAD51D.  The [facility]Next gene panel offered by [employer] includes sequencing and rearrangement analysis for the following 36 genes: APC*, ATM*, AXIN2, BARD1, BMPR1A, BRCA1*, BRCA2*, BRIP1*, CDH1*, CDK4, CDKN2A, CHEK2*, DICER1, MLH1*, MSH2*, MSH3, MSH6*, MUTYH*, NBN, NF1*, NTHL1, PALB2*, PMS2*, PTEN*, RAD51C*, RAD51D*, RECQL, SMAD4, SMARCA4, STK11 and TP53* (sequencing and deletion/duplication); HOXB13, POLD1 and POLE (sequencing only); EPCAM and GREM1 (deletion/duplication only).    05/21/2019 Imaging   1. Interval hysterectomy, oophorectomy and omentectomy. No evidence of residual ovarian carcinoma in the pelvis. No evidence of peritoneal disease. No intraperitoneal free fluid. 2. Nonobstructing LEFT renal calculi.  Normal ureters.   05/22/2019 Tumor Marker   Patient's tumor was tested for the following markers: CA-125 Results of the tumor marker test revealed 5.7.   06/03/2019 Procedure   Successful right IJ vein Port-A-Cath explant.   08/26/2019 Tumor Marker   Patient's tumor was tested for the following markers: CA-125. Results of the tumor marker test revealed 4.9   10/03/2019 Imaging   1. New tumor deposits along the capsular surfaces of the liver, spleen, in the left pelvis, and right paracolic gutter, compatible with metastatic disease. 2. Mild dilation of the dorsal pancreatic duct in the pancreatic body and head, cause uncertain. 3. Nonobstructive left nephrolithiasis. 4. Aortic atherosclerosis.   Aortic Atherosclerosis (ICD10-I70.0   10/10/2019 Procedure   Successful placement of a right IJ approach Power  Port with ultrasound and fluoroscopic guidance. The catheter is ready for use.     10/11/2019 Tumor Marker   Patient's tumor was tested for the following markers: CA-125 Results of the tumor marker test revealed 19.6.   10/14/2019 -  Chemotherapy   The patient had carboplatin and gemzar for chemotherapy treatment.     11/04/2019 Tumor Marker   Patient's tumor was tested for the following markers: CA-125 Results of the tumor marker test revealed 11.4   11/28/2019 Tumor Marker   Patient's tumor was tested for the following markers: CA-125 Results of the tumor marker test revealed 5.8   12/20/2019 Imaging   1. Significant interval reduction in mixed solid and cystic nodule in the vicinity of the left ovary, with significant improvement or resolution of multiple pelvic, peritoneal, and organ capsule implants. Resolution of previously seen small volume ascites. Findings are consistent with treatment response of abdominal metastatic disease. 2. Status post hysterectomy, oophorectomy, and omentectomy. 3. Nonobstructive left nephrolithiasis. 4. Aortic Atherosclerosis (ICD10-I70.0).     REVIEW OF SYSTEMS:   Constitutional: Denies fevers, chills or abnormal weight loss Eyes: Denies blurriness of vision Ears, nose, mouth, throat, and face: Denies mucositis or sore throat Respiratory: Denies cough, dyspnea or wheezes Cardiovascular: Denies palpitation, chest discomfort or lower extremity swelling Gastrointestinal:  Denies nausea, heartburn or change in bowel habits Skin: Denies abnormal skin rashes Lymphatics: Denies new lymphadenopathy or easy bruising Neurological:Denies numbness, tingling or new weaknesses Behavioral/Psych: Mood is stable, no new changes  All other systems were reviewed with the patient and are negative.  I have reviewed the past medical history, past surgical history, social history and family history with the patient and they are unchanged from previous note.  ALLERGIES:   is allergic to skin adhesives [cyanoacrylate], shrimp [shellfish allergy], and sulfonamide derivatives.  MEDICATIONS:  Current Outpatient Medications  Medication Sig  Dispense Refill   ALPRAZolam (XANAX) 0.25 MG tablet Take 0.25 mg by mouth at bedtime as needed for anxiety.     carboxymethylcellulose (REFRESH PLUS) 0.5 % SOLN Place 1 drop into both eyes 2 (two) times daily as needed (dry eyes).     Cholecalciferol (VITAMIN D3) 25 MCG (1000 UT) CAPS Take 1,000 Units by mouth daily.      esomeprazole (NEXIUM) 20 MG capsule Take 20 mg by mouth daily.      lidocaine-prilocaine (EMLA) cream Apply 1 application topically as needed. 30 g 11   loratadine (CLARITIN) 10 MG tablet Take 10 mg by mouth daily as needed for allergies.     Multiple Vitamin (MULTIVITAMIN WITH MINERALS) TABS tablet Take 1 tablet by mouth daily.     ondansetron (ZOFRAN) 8 MG tablet Take 8 mg by mouth every 8 (eight) hours as needed.     prochlorperazine (COMPAZINE) 10 MG tablet Take 10 mg by mouth every 6 (six) hours as needed.     No current facility-administered medications for this visit.   Facility-Administered Medications Ordered in Other Visits  Medication Dose Route Frequency Provider Last Rate Last Admin   sodium chloride flush (NS) 0.9 % injection 10 mL  10 mL Intracatheter PRN [name], Miasha Emmons, MD   10 mL at 12/23/19 1458    PHYSICAL EXAMINATION: ECOG PERFORMANCE STATUS: 1 - Symptomatic but completely ambulatory  Vitals:   12/23/19 1314  BP: 133/81  Pulse: 83  Resp: 18  Temp: 98.2 F (36.8 C)  SpO2: 100%   Filed Weights   12/23/19 1314  Weight: 145 lb 9.6 oz (66 kg)    GENERAL:alert, no distress and comfortable NEURO: alert & oriented x 3 with fluent speech, no focal motor/sensory deficits  LABORATORY DATA:  I have reviewed the data as listed    Component Value Date/Time   NA 139 12/23/2019 1251   K 4.0 12/23/2019 1251   CL 108 12/23/2019 1251   CO2 25 12/23/2019 1251   GLUCOSE 91  12/23/2019 1251   BUN 16 12/23/2019 1251   CREATININE 0.63 12/23/2019 1251   CALCIUM 9.7 12/23/2019 1251   PROT 6.4 (L) 12/23/2019 1251   ALBUMIN 3.8 12/23/2019 1251   AST 40 12/23/2019 1251   ALT 63 (H) 12/23/2019 1251   ALKPHOS 114 12/23/2019 1251   BILITOT 0.8 12/23/2019 1251   GFRNONAA >60 12/23/2019 1251   GFRAA >60 12/23/2019 1251    No results found for: SPEP, UPEP  Lab Results  Component Value Date   WBC 5.4 12/23/2019   NEUTROABS 3.6 12/23/2019   HGB 11.3 (L) 12/23/2019   HCT 33.7 (L) 12/23/2019   MCV 98.5 12/23/2019   PLT 373 12/23/2019      Chemistry      Component Value Date/Time   NA 139 12/23/2019 1251   K 4.0 12/23/2019 1251   CL 108 12/23/2019 1251   CO2 25 12/23/2019 1251   BUN 16 12/23/2019 1251   CREATININE 0.63 12/23/2019 1251      Component Value Date/Time   CALCIUM 9.7 12/23/2019 1251   ALKPHOS 114 12/23/2019 1251   AST 40 12/23/2019 1251   ALT 63 (H) 12/23/2019 1251   BILITOT 0.8 12/23/2019 1251       RADIOGRAPHIC STUDIES: I have reviewed multiple CT imaging with the patient I have personally reviewed the radiological images as listed and agreed with the findings in the report. CT ABDOMEN PELVIS W CONTRAST  Result Date: 12/20/2019 CLINICAL DATA:  Follow-up ovarian  cancer, status post hysterectomy and oophorectomy, omentectomy, ongoing chemotherapy EXAM: CT ABDOMEN AND PELVIS WITH CONTRAST TECHNIQUE: Multidetector CT imaging of the abdomen and pelvis was performed using the standard protocol following bolus administration of intravenous contrast. CONTRAST:  162m OMNIPAQUE IOHEXOL 300 MG/ML SOLN, additional oral enteric contrast COMPARISON:  10/02/2019 FINDINGS: Lower chest: No acute abnormality. Hepatobiliary: No solid liver abnormality is seen. Soft tissue nodule in the porta hepatis is almost completely resolved measuring no greater than 8 mm, previously 1.7 x 1.0 cm (series 2, image 60). No gallstones, gallbladder wall thickening, or biliary  dilatation. Pancreas: Unremarkable. No pancreatic ductal dilatation or surrounding inflammatory changes. Spleen: Normal in size without significant abnormality. Previously noted perisplenic soft tissue nodules are resolved (series 2, image 20). Adrenals/Urinary Tract: Adrenal glands are unremarkable. Nonobstructive superior pole calculi of the left kidney. No hydronephrosis. Bladder is unremarkable. Stomach/Bowel: Stomach is within normal limits. Appendix appears normal. No evidence of bowel wall thickening, distention, or inflammatory changes. Vascular/Lymphatic: Aortic atherosclerosis. No enlarged abdominal or pelvic lymph nodes. Reproductive: Status post hysterectomy, oophorectomy, and omentectomy. Significant interval reduction in mixed solid and cystic nodule in the vicinity of the left ovary, now measuring 1.7 x 1.6 cm, previously 3.7 x 2.4 cm (series 2, image 64). Essentially complete resolution of multiple soft tissue nodules in the low pelvis, a small residual nodule adjacent to the rectum measuring no greater than 4 mm, previously 1.1 cm (series 2, image 60). Soft tissue nodule in the right paracolic gutter is resolved (series 2, image 53). Other: No abdominal wall hernia or abnormality. No abdominopelvic ascites. Musculoskeletal: No acute or significant osseous findings. IMPRESSION: 1. Significant interval reduction in mixed solid and cystic nodule in the vicinity of the left ovary, with significant improvement or resolution of multiple pelvic, peritoneal, and organ capsule implants. Resolution of previously seen small volume ascites. Findings are consistent with treatment response of abdominal metastatic disease. 2. Status post hysterectomy, oophorectomy, and omentectomy. 3. Nonobstructive left nephrolithiasis. 4. Aortic Atherosclerosis (ICD10-I70.0). Electronically Signed   By: AEddie CandleM.D.   On: 12/20/2019 14:31

## 2019-12-23 NOTE — Assessment & Plan Note (Signed)
She has intermittent elevated liver enzymes °Could be due to chemo side effects versus fatty liver °CT imaging show near complete resolution of liver lesions °Observe only for now °

## 2019-12-23 NOTE — Assessment & Plan Note (Signed)

## 2019-12-23 NOTE — Patient Instructions (Signed)

## 2019-12-23 NOTE — Assessment & Plan Note (Signed)
I have reviewed her recent blood work and imaging studies The patient have excellent response to treatment with near complete response She is having some intermittent reaction to carboplatin I recommend discontinuation of carboplatin and focus on gemcitabine, single agent as maintenance treatment She is in agreement to proceed For now, I plan to keep her gemcitabine to day 1, day 8, rest day 15, for cycle of every 21 days I will see her back in 3 weeks for further follow-up

## 2019-12-23 NOTE — Assessment & Plan Note (Signed)
We have extensive discussions about goals of care The patient is interested for preventive strategies to maximize her longevity and outcome We have discussion about weight loss strategies and integrating exercise on a regular basis We will revisit this issue in her next visit

## 2019-12-24 LAB — CA 125: Cancer Antigen (CA) 125: 5 U/mL (ref 0.0–38.1)

## 2019-12-30 ENCOUNTER — Inpatient Hospital Stay: Payer: Managed Care, Other (non HMO)

## 2019-12-30 ENCOUNTER — Other Ambulatory Visit: Payer: Self-pay | Admitting: Hematology and Oncology

## 2019-12-30 ENCOUNTER — Other Ambulatory Visit: Payer: Self-pay

## 2019-12-30 VITALS — BP 149/90 | HR 70 | Temp 98.7°F | Resp 18 | Ht 62.0 in | Wt 145.0 lb

## 2019-12-30 DIAGNOSIS — Z7189 Other specified counseling: Secondary | ICD-10-CM

## 2019-12-30 DIAGNOSIS — C562 Malignant neoplasm of left ovary: Secondary | ICD-10-CM

## 2019-12-30 DIAGNOSIS — Z5111 Encounter for antineoplastic chemotherapy: Secondary | ICD-10-CM | POA: Diagnosis not present

## 2019-12-30 LAB — CBC WITH DIFFERENTIAL (CANCER CENTER ONLY)
Abs Immature Granulocytes: 0.04 10*3/uL (ref 0.00–0.07)
Basophils Absolute: 0 10*3/uL (ref 0.0–0.1)
Basophils Relative: 1 %
Eosinophils Absolute: 0 10*3/uL (ref 0.0–0.5)
Eosinophils Relative: 1 %
HCT: 31.1 % — ABNORMAL LOW (ref 36.0–46.0)
Hemoglobin: 10.5 g/dL — ABNORMAL LOW (ref 12.0–15.0)
Immature Granulocytes: 2 %
Lymphocytes Relative: 50 %
Lymphs Abs: 1.2 10*3/uL (ref 0.7–4.0)
MCH: 32.7 pg (ref 26.0–34.0)
MCHC: 33.8 g/dL (ref 30.0–36.0)
MCV: 96.9 fL (ref 80.0–100.0)
Monocytes Absolute: 0.2 10*3/uL (ref 0.1–1.0)
Monocytes Relative: 10 %
Neutro Abs: 0.9 10*3/uL — ABNORMAL LOW (ref 1.7–7.7)
Neutrophils Relative %: 36 %
Platelet Count: 203 10*3/uL (ref 150–400)
RBC: 3.21 MIL/uL — ABNORMAL LOW (ref 3.87–5.11)
RDW: 14.2 % (ref 11.5–15.5)
WBC Count: 2.4 10*3/uL — ABNORMAL LOW (ref 4.0–10.5)
nRBC: 0 % (ref 0.0–0.2)

## 2019-12-30 LAB — CMP (CANCER CENTER ONLY)
ALT: 124 U/L — ABNORMAL HIGH (ref 0–44)
AST: 78 U/L — ABNORMAL HIGH (ref 15–41)
Albumin: 3.6 g/dL (ref 3.5–5.0)
Alkaline Phosphatase: 169 U/L — ABNORMAL HIGH (ref 38–126)
Anion gap: 8 (ref 5–15)
BUN: 10 mg/dL (ref 8–23)
CO2: 26 mmol/L (ref 22–32)
Calcium: 9.6 mg/dL (ref 8.9–10.3)
Chloride: 109 mmol/L (ref 98–111)
Creatinine: 0.64 mg/dL (ref 0.44–1.00)
GFR, Est AFR Am: >60 mL/min (ref >60)
GFR, Estimated: >60 mL/min (ref >60)
Glucose, Bld: 108 mg/dL — ABNORMAL HIGH (ref 70–99)
Potassium: 3.6 mmol/L (ref 3.5–5.1)
Sodium: 143 mmol/L (ref 135–145)
Total Bilirubin: 0.9 mg/dL (ref 0.3–1.2)
Total Protein: 6.3 g/dL — ABNORMAL LOW (ref 6.5–8.1)

## 2019-12-30 MED ORDER — PROCHLORPERAZINE MALEATE 10 MG PO TABS
10.0000 mg | ORAL_TABLET | Freq: Once | ORAL | Status: AC
  Administered 2019-12-30: 10 mg via ORAL

## 2019-12-30 MED ORDER — SODIUM CHLORIDE 0.9 % IV SOLN
480.0000 mg/m2 | Freq: Once | INTRAVENOUS | Status: AC
  Administered 2019-12-30: 798 mg via INTRAVENOUS
  Filled 2019-12-30: qty 20.99

## 2019-12-30 MED ORDER — SODIUM CHLORIDE 0.9% FLUSH
10.0000 mL | INTRAVENOUS | Status: DC | PRN
  Administered 2019-12-30: 10 mL
  Filled 2019-12-30: qty 10

## 2019-12-30 MED ORDER — PROCHLORPERAZINE MALEATE 10 MG PO TABS
ORAL_TABLET | ORAL | Status: AC
  Filled 2019-12-30: qty 1

## 2019-12-30 MED ORDER — HEPARIN SOD (PORK) LOCK FLUSH 100 UNIT/ML IV SOLN
500.0000 [IU] | Freq: Once | INTRAVENOUS | Status: AC | PRN
  Administered 2019-12-30: 500 [IU]
  Filled 2019-12-30: qty 5

## 2019-12-30 MED ORDER — SODIUM CHLORIDE 0.9 % IV SOLN
Freq: Once | INTRAVENOUS | Status: AC
  Filled 2019-12-30: qty 250

## 2019-12-30 NOTE — Progress Notes (Signed)
Ok to treat w/ ANC .9 and Elevated AST and Alt today per V.O. from Dr. Alvy Bimler

## 2019-12-30 NOTE — Patient Instructions (Addendum)
Managing Low Blood Counts During Cancer Treatment Cancer treatments such as chemotherapy and radiation can sometimes cause a drop in the supply of blood cells in the body, including red blood cells, white blood cells, and platelets. These blood cells are produced in the body and are released into the blood to perform specific functions: Red blood cells carry gases such as oxygen and carbon dioxide to and from your lungs. White blood cells help protect you from infection. Platelets help your body to form blood clots to prevent and control bleeding. When cancer treatments cause a drop in blood cell counts, your body may not have enough cells to keep up its normal functions. Symptoms or problems that may result will vary depending on which type of blood cells the treatment is affecting. If your blood counts are low, you can take steps to help manage any problems. How can low blood counts affect me? Low blood counts have various effects depending on the type of blood cells involved: If you have a low number of red blood cells, you have a condition called anemia. This can cause symptoms such as: Feeling tired and weak. Feeling light-headed. Being short of breath. If you have a low number of white blood cells, you may be at higher risk for infections. If you have a low number of platelets, you may bleed more easily, or your body may have trouble stopping any bleeding. You may also have more bruising. How to manage symptoms or prevent problems from a low blood count If you have a low blood count, you can take steps to manage symptoms or prevent problems that may develop. The steps to take will depend on which type of blood cell is low. Low red blood cells Take these steps to help manage the symptoms of anemia: Go for a walk or do some light exercise each day. Take short naps during the day. Eat foods that contain a lot of iron and protein. These include leafy vegetables, meat and fish, beans, sweet  potatoes, and dried fruit such as prunes, raisins, and apricots. Ask for help with errands and with work that needs to be done around the house. It is important to save your energy. Take vitamins or supplements--such as iron, vitamin D53, or folic acid--as told by your health care provider. Practice relaxation techniques, such as yoga or meditation. Low white blood cells Take these steps to help prevent infections: Wash your hands often with warm, soapy water. Avoid crowds of people and any person who has the flu or a fever. Take care when cleaning yourself after using the bathroom. Tell your health care provider if you have any rectal sores or bleeding. Avoid dental work. Check your mouth each day for sores or signs of infection. Do not share utensils. Avoid contact with pet waste. Wash your hands after handling pets. If you get a scrape or cut, clean it thoroughly right away. Avoid fresh plants or dried flowers. Do not swim or wade in lakes, ponds, rivers, water parks, or hot tubs. Follow food safety guidelines. Cook meat thoroughly and wash all raw fruits and vegetables. You may be instructed to wear a mask when around others to protect yourself.  Low platelets Take these steps to help prevent or control bleeding and bruising: Use an electric razor for shaving instead of a blade. Use a soft toothbrush and be careful during oral care. Talk with your cancer care team about whether you should avoid flossing. If your mouth is bleeding, rinse it  with ice water. Avoid activities that could cause injury, such as contact sports. Talk with your health care provider about using laxatives or stool softeners to avoid constipation. Do not use medicines such as ibuprofen, aspirin, or naproxen unless your health care provider tells you to. Limit alcohol use. Monitor any bleeding closely. If you start bleeding, hold pressure on the area for 5 minutes to stop the bleeding. Bleeding that does not stop is  considered an emergency. What treatments can help increase a low blood count? If needed, your health care provider may recommend treatment for a low blood count. Treatment will depend on the type of blood cell that is low and the severity of your condition. Treatment options may include: Taking medicines to help stimulate the growth of blood cells. This is an option for treating a low red blood cell count. Your health care provider may also recommend that you take iron, folic acid, or vitamin B12 supplements. Making dietary changes. Including more iron and protein in your diet can help stimulate the growth of red blood cells. Adjusting your current medicines to help raise blood counts. Making changes to your treatment plan. Having a blood transfusion. This may be done if your blood count is very low. Contact a health care provider if: You feel extremely tired and weak. You have more bruising or bleeding. You feel ill or you develop a cough. You have swelling or redness. You have mouth sores or a sore throat. You have painful urination or you have blood in your urine or stool. You are thinking of taking any new supplements or vitamins or making dietary changes. Get help right away if: You are short of breath, have chest pain, or feel dizzy. You have a fever or chills. You have abdominal pain or diarrhea. You have bleeding that will not stop. Summary Cancer treatments such as chemotherapy and radiation can sometimes cause a drop in the supply of blood cells in the body, including red blood cells, white blood cells, and platelets. If you have a low blood count, you can take steps to manage symptoms or prevent problems that may develop. Depending on which type of blood cell is low, you may need to take steps to prevent infection, prevent bleeding, or manage symptoms that may develop. If needed, your health care provider may recommend treatment for a low blood count. This information is not  intended to replace advice given to you by your health care provider. Make sure you discuss any questions you have with your health care provider. Document Revised: 05/26/2016 Document Reviewed: 01/02/2016 Elsevier Patient Education  2020 [place] [facility] Discharge Instructions for Patients Receiving Chemotherapy  Today you received the following chemotherapy agents Gemzar  To help prevent nausea and vomiting after your treatment, we encourage you to take your nausea medication as directed.   If you develop nausea and vomiting that is not controlled by your nausea medication, call the clinic.   BELOW ARE SYMPTOMS THAT SHOULD BE REPORTED IMMEDIATELY:  *FEVER GREATER THAN 100.5 F  *CHILLS WITH OR WITHOUT FEVER  NAUSEA AND VOMITING THAT IS NOT CONTROLLED WITH YOUR NAUSEA MEDICATION  *UNUSUAL SHORTNESS OF BREATH  *UNUSUAL BRUISING OR BLEEDING  TENDERNESS IN MOUTH AND THROAT WITH OR WITHOUT PRESENCE OF ULCERS  *URINARY PROBLEMS  *BOWEL PROBLEMS  UNUSUAL RASH Items with * indicate a potential emergency and should be followed up as soon as possible.  Feel free to call the clinic should you have any questions or  concerns. The clinic phone number is [phone].  Please show the [place] at check-in to the Emergency Department and triage nurse.

## 2020-01-13 ENCOUNTER — Inpatient Hospital Stay: Payer: Managed Care, Other (non HMO)

## 2020-01-13 ENCOUNTER — Encounter: Payer: Self-pay | Admitting: Hematology and Oncology

## 2020-01-13 ENCOUNTER — Inpatient Hospital Stay (HOSPITAL_BASED_OUTPATIENT_CLINIC_OR_DEPARTMENT_OTHER): Payer: Managed Care, Other (non HMO) | Admitting: Hematology and Oncology

## 2020-01-13 ENCOUNTER — Other Ambulatory Visit: Payer: Self-pay

## 2020-01-13 DIAGNOSIS — C786 Secondary malignant neoplasm of retroperitoneum and peritoneum: Secondary | ICD-10-CM

## 2020-01-13 DIAGNOSIS — Z299 Encounter for prophylactic measures, unspecified: Secondary | ICD-10-CM | POA: Diagnosis not present

## 2020-01-13 DIAGNOSIS — C562 Malignant neoplasm of left ovary: Secondary | ICD-10-CM

## 2020-01-13 DIAGNOSIS — Z7189 Other specified counseling: Secondary | ICD-10-CM

## 2020-01-13 DIAGNOSIS — R748 Abnormal levels of other serum enzymes: Secondary | ICD-10-CM | POA: Diagnosis not present

## 2020-01-13 DIAGNOSIS — T451X5A Adverse effect of antineoplastic and immunosuppressive drugs, initial encounter: Secondary | ICD-10-CM

## 2020-01-13 DIAGNOSIS — Z5111 Encounter for antineoplastic chemotherapy: Secondary | ICD-10-CM | POA: Diagnosis not present

## 2020-01-13 DIAGNOSIS — D6481 Anemia due to antineoplastic chemotherapy: Secondary | ICD-10-CM | POA: Diagnosis not present

## 2020-01-13 DIAGNOSIS — Z23 Encounter for immunization: Secondary | ICD-10-CM

## 2020-01-13 LAB — CMP (CANCER CENTER ONLY)
ALT: 74 U/L — ABNORMAL HIGH (ref 0–44)
AST: 43 U/L — ABNORMAL HIGH (ref 15–41)
Albumin: 3.6 g/dL (ref 3.5–5.0)
Alkaline Phosphatase: 163 U/L — ABNORMAL HIGH (ref 38–126)
Anion gap: 8 (ref 5–15)
BUN: 14 mg/dL (ref 8–23)
CO2: 25 mmol/L (ref 22–32)
Calcium: 9.3 mg/dL (ref 8.9–10.3)
Chloride: 108 mmol/L (ref 98–111)
Creatinine: 0.63 mg/dL (ref 0.44–1.00)
GFR, Est AFR Am: >60 mL/min (ref >60)
GFR, Estimated: >60 mL/min (ref >60)
Glucose, Bld: 89 mg/dL (ref 70–99)
Potassium: 4 mmol/L (ref 3.5–5.1)
Sodium: 141 mmol/L (ref 135–145)
Total Bilirubin: 1.1 mg/dL (ref 0.3–1.2)
Total Protein: 6.2 g/dL — ABNORMAL LOW (ref 6.5–8.1)

## 2020-01-13 LAB — CBC WITH DIFFERENTIAL (CANCER CENTER ONLY)
Abs Immature Granulocytes: 0.01 10*3/uL (ref 0.00–0.07)
Basophils Absolute: 0 10*3/uL (ref 0.0–0.1)
Basophils Relative: 0 %
Eosinophils Absolute: 0.1 10*3/uL (ref 0.0–0.5)
Eosinophils Relative: 1 %
HCT: 32.7 % — ABNORMAL LOW (ref 36.0–46.0)
Hemoglobin: 11 g/dL — ABNORMAL LOW (ref 12.0–15.0)
Immature Granulocytes: 0 %
Lymphocytes Relative: 25 %
Lymphs Abs: 1.3 10*3/uL (ref 0.7–4.0)
MCH: 33.7 pg (ref 26.0–34.0)
MCHC: 33.6 g/dL (ref 30.0–36.0)
MCV: 100.3 fL — ABNORMAL HIGH (ref 80.0–100.0)
Monocytes Absolute: 0.6 10*3/uL (ref 0.1–1.0)
Monocytes Relative: 12 %
Neutro Abs: 3.2 10*3/uL (ref 1.7–7.7)
Neutrophils Relative %: 62 %
Platelet Count: 278 10*3/uL (ref 150–400)
RBC: 3.26 MIL/uL — ABNORMAL LOW (ref 3.87–5.11)
RDW: 16 % — ABNORMAL HIGH (ref 11.5–15.5)
WBC Count: 5.2 10*3/uL (ref 4.0–10.5)
nRBC: 0 % (ref 0.0–0.2)

## 2020-01-13 MED ORDER — PROCHLORPERAZINE MALEATE 10 MG PO TABS
ORAL_TABLET | ORAL | Status: AC
  Filled 2020-01-13: qty 1

## 2020-01-13 MED ORDER — HEPARIN SOD (PORK) LOCK FLUSH 100 UNIT/ML IV SOLN
500.0000 [IU] | Freq: Once | INTRAVENOUS | Status: AC | PRN
  Administered 2020-01-13: 500 [IU]
  Filled 2020-01-13: qty 5

## 2020-01-13 MED ORDER — SODIUM CHLORIDE 0.9% FLUSH
10.0000 mL | Freq: Once | INTRAVENOUS | Status: AC
  Administered 2020-01-13: 10 mL
  Filled 2020-01-13: qty 10

## 2020-01-13 MED ORDER — PROCHLORPERAZINE MALEATE 10 MG PO TABS
10.0000 mg | ORAL_TABLET | Freq: Once | ORAL | Status: AC
  Administered 2020-01-13: 10 mg via ORAL

## 2020-01-13 MED ORDER — SODIUM CHLORIDE 0.9% FLUSH
10.0000 mL | INTRAVENOUS | Status: DC | PRN
  Administered 2020-01-13: 10 mL
  Filled 2020-01-13: qty 10

## 2020-01-13 MED ORDER — SODIUM CHLORIDE 0.9 % IV SOLN
480.0000 mg/m2 | Freq: Once | INTRAVENOUS | Status: AC
  Administered 2020-01-13: 798 mg via INTRAVENOUS
  Filled 2020-01-13: qty 20.99

## 2020-01-13 MED ORDER — SODIUM CHLORIDE 0.9 % IV SOLN
Freq: Once | INTRAVENOUS | Status: AC
  Filled 2020-01-13: qty 250

## 2020-01-13 NOTE — Assessment & Plan Note (Signed)
We discussed the role of booster Covid vaccine while on treatment She is in agreement to proceed

## 2020-01-13 NOTE — Patient Instructions (Signed)
[facility] °Discharge Instructions for Patients Receiving Chemotherapy ° °Today you received the following chemotherapy agents Gemzar ° °To help prevent nausea and vomiting after your treatment, we encourage you to take your nausea medication as directed. °  °If you develop nausea and vomiting that is not controlled by your nausea medication, call the clinic.  ° °BELOW ARE SYMPTOMS THAT SHOULD BE REPORTED IMMEDIATELY: °· *FEVER GREATER THAN 100.5 F °· *CHILLS WITH OR WITHOUT FEVER °· NAUSEA AND VOMITING THAT IS NOT CONTROLLED WITH YOUR NAUSEA MEDICATION °· *UNUSUAL SHORTNESS OF BREATH °· *UNUSUAL BRUISING OR BLEEDING °· TENDERNESS IN MOUTH AND THROAT WITH OR WITHOUT PRESENCE OF ULCERS °· *URINARY PROBLEMS °· *BOWEL PROBLEMS °· UNUSUAL RASH °Items with * indicate a potential emergency and should be followed up as soon as possible. ° °Feel free to call the clinic should you have any questions or concerns. The clinic phone number is [phone]. ° °Please show the CHEMO ALERT CARD at check-in to the Emergency Department and triage nurse. ° ° °

## 2020-01-13 NOTE — Assessment & Plan Note (Signed)
This is likely due to recent treatment. The patient denies recent history of bleeding such as epistaxis, hematuria or hematochezia. She is asymptomatic from the anemia. I will observe for now.  She does not require transfusion now. I will continue the chemotherapy at current dose without dosage adjustment.  

## 2020-01-13 NOTE — Progress Notes (Signed)
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 The patient denies recent history of bleeding such as epistaxis, hematuria or hematochezia. She is asymptomatic from the anemia. I will observe for now.  She does not require transfusion now. I will continue the chemotherapy at current dose without dosage adjustment.   Preventive measure We discussed the role of booster Covid vaccine while on treatment She is in agreement to proceed   No orders of the defined types were placed in this encounter.   All questions were answered. The patient knows to call the clinic with any problems, questions or concerns. The total time spent in the appointment was 20 minutes encounter with patients including review of chart and various tests results, discussions about plan of care and coordination of care plan   [name] Lark, MD 01/13/2020 1:11 PM  INTERVAL HISTORY: Please see below for problem oriented charting. 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 14.7 cm poorly defined soft tissue mass in central pelvis suspicious for primary ovarian carcinoma, with uterine leiomyosarcoma considered a less likely differential diagnosis. 2. Moderate ascites and diffuse peritoneal carcinomatosis. 3. Several small uterine fibroids.   11/13/2018 Tumor Marker   Patient's tumor was tested for the following markers: CA-125 Results of the tumor marker test revealed 1434   11/21/2018 Procedure   Successful ultrasound-guided diagnostic and therapeutic paracentesis yielding 3.1 liters of peritoneal fluid   11/21/2018 Pathology Results   PERITONEAL/ASCITIC FLUID(SPECIMEN 1 OF 1 COLLECTED 11/21/18): ADENOCARCINOMA. Specimen Clinical Information Pelvic mass suspicious for ovarian cancer Source Peritoneal/Ascitic Fluid, (specimen 1 of 1 collected 11/21/18) Gross Specimen: Received is/are 1000 ccs of dark amber fluid. (CM:cm) Prepared: # Smears: 0 # Concentration Technique Slides (i.e. 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   11/29/2018 Initial Diagnosis   Ovarian cancer ([place])   11/29/2018 Cancer Staging   Staging form: Ovary, Fallopian Tube, and Primary Peritoneal Carcinoma, AJCC 8th Edition - Clinical: cT3, cN0, cM0 - Signed by [name] Lark, MD on 11/29/2018   12/03/2018 Procedure   Successful ultrasound-guided therapeutic paracentesis yielding 3.7 liters of peritoneal fluid.     12/06/2018 Procedure   Placement of a subcutaneous port device. Catheter tip at the SVC and right atrium junction   12/14/2018 Procedure   Successful ultrasound-guided paracentesis yielding 2.9 L of peritoneal fluid   12/28/2018 Tumor Marker   Patient's tumor was tested for the following markers: CA-125 Results of the tumor marker test revealed 812.  12/28/2018 - 04/22/2019 Chemotherapy   The patient had carboplatin and taxol for neoadjuvant treatment, followed by  interval debulking surgery and subsequent adjuvant chemotherapy treatment.     02/01/2019 Imaging   Ct abdomen and pelvis 8.6 cm left ovarian mass, corresponding to the patient's known primary neoplasm, improved.   Mild peritoneal nodularity/omental caking, improved.   Small abdominopelvic ascites, improved.     02/01/2019 Tumor Marker   Patient's tumor was tested for the following markers: CA-125 Results of the tumor marker test revealed 183.   02/12/2019 Surgery   Preoperative Diagnosis: Stage IIIC ovarian cancer, s/p neoadjuvant chemotherapy      Procedure(s) Performed: 1. Exploratory laparotomy with total abdominal hysterectomy, bilateral salpingo-oophorectomy, omentectomy radical tumor debulking for ovarian cancer.   Surgeon: Thereasa Solo, MD.    Operative Findings: upper abdomen free of disease. No visible omental disease. Small volume (200cc) ascites. 8cm friable mass replacing left ovary and adherent to the sigmoid colon mesentery and ureter on the left.  Anterior fibroid. This represented an optimal cytoreduction (R0) with no gross visible disease remaining.    02/12/2019 Pathology Results   A. OVARY AND FALLOPIAN TUBE, LEFT, SALPINGO-OOPHORECTOMY: - Serous carcinoma, high grade, status post neoadjuvant therapy - See oncology table and comment below B. UTERUS CERVIX WITH RIGHT FALLOPIAN TUBE AND OVARY, HYSTERECTOMY: Uterus: - Serosal surface involved by serous carcinoma - Endomyometrium uninvolved by carcinoma - Benign endometrial polyp (4.1 cm) - Leiomyomata (5.5 cm; largest) - Adenomyosis Cervix: - Uninvolved by carcinoma Left ovary: - Serous carcinoma, high grade Left fallopian tube: - Serous carcinoma, high grade C. SOFT TISSUE, LEFT PELVIC SIDEWALL TUMOR, EXCISION: - Metastatic serous carcinoma, high-grade D. SOFT TISSUE, SIGMOID COLON MESENTERY, EXCISION: - Metastatic serous carcinoma, high-grade E. OMENTUM, TUMOR RESECTION: - Metastatic serous carcinoma,  high-grade OVARY or FALLOPIAN TUBE or PRIMARY PERITONEUM: Procedure: Total hysterectomy and bilateral salpingo-oophorectomy, omentectomy and peritoneal biopsies Specimen Integrity: Fragmented Tumor Site: Left ovary and fallopian tube Ovarian Surface Involvement: Present Fallopian Tube Surface Involvement: Present Tumor Size: 6.3 cm (see comment) Histologic Type: Serous carcinoma Histologic Grade: High-grade Implants: Not applicable Other Tissue/ Organ Involvement: Right ovary, right fallopian tube, omentum, mesentery Largest Extrapelvic Peritoneal Focus: Macroscopic Peritoneal/Ascitic Fluid: Malignant Treatment Effect: No definite or minimal response identified (chemotherapy response score 1 [CRS1] Regional Lymph Nodes: No lymph nodes submitted or found Pathologic Stage Classification (pTNM, AJCC 8th Edition): pT3c, pNX Representative Tumor Block: A4 Comment(s): Additional testing (HER-2, MMR and MSI) are pending. The primary tumor site appears to be the left ovary and fallopian tube. The uterus is only involved on the serosal surface. There is tumor on the anterior peritoneal reflection.  Addendum: Tumor is Her2 negative,MSI stable   02/19/2019 Tumor Marker   Patient's tumor was tested for the following markers: CA-125 Results of the tumor marker test revealed 40.3   04/01/2019 Tumor Marker   Patient's tumor was tested for the following markers: CA-125 Results of the tumor marker test revealed 8.1    Genetic Testing   Negative testing. No pathogenic variants identified on the [employer]. The report date is 04/17/2019.  Somatic genes analyzed through [facility]: ATM, BARD1, BRCA1, BRCA2, BRIP1, CHEK2, MRE11A, NBN, PALB2, RAD51C, RAD51D.  The CancerNext gene panel offered by [employer] includes sequencing and rearrangement analysis for the following 36 genes: APC*, ATM*, AXIN2, BARD1, BMPR1A, BRCA1*, BRCA2*, BRIP1*, CDH1*, CDK4, CDKN2A, CHEK2*, DICER1,  MLH1*, MSH2*, MSH3, MSH6*, MUTYH*, NBN, NF1*, NTHL1, PALB2*, PMS2*, PTEN*, RAD51C*, RAD51D*, RECQL, SMAD4, SMARCA4, STK11 and TP53* (  sequencing and deletion/duplication); HOXB13, POLD1 and POLE (sequencing only); EPCAM and GREM1 (deletion/duplication only).    05/21/2019 Imaging   1. Interval hysterectomy, oophorectomy and omentectomy. No evidence of residual ovarian carcinoma in the pelvis. No evidence of peritoneal disease. No intraperitoneal free fluid. 2. Nonobstructing LEFT renal calculi.  Normal ureters.   05/22/2019 Tumor Marker   Patient's tumor was tested for the following markers: CA-125 Results of the tumor marker test revealed 5.7.   06/03/2019 Procedure   Successful right IJ vein Port-A-Cath explant.   08/26/2019 Tumor Marker   Patient's tumor was tested for the following markers: CA-125. Results of the tumor marker test revealed 4.9   10/03/2019 Imaging   1. New tumor deposits along the capsular surfaces of the liver, spleen, in the left pelvis, and right paracolic gutter, compatible with metastatic disease. 2. Mild dilation of the dorsal pancreatic duct in the pancreatic body and head, cause uncertain. 3. Nonobstructive left nephrolithiasis. 4. Aortic atherosclerosis.   Aortic Atherosclerosis (ICD10-I70.0   10/10/2019 Procedure   Successful placement of a right IJ approach Power Port with ultrasound and fluoroscopic guidance. The catheter is ready for use.     10/11/2019 Tumor Marker   Patient's tumor was tested for the following markers: CA-125 Results of the tumor marker test revealed 19.6.   10/14/2019 -  Chemotherapy   The patient had carboplatin and gemzar for chemotherapy treatment.     11/04/2019 Tumor Marker   Patient's tumor was tested for the following markers: CA-125 Results of the tumor marker test revealed 11.4   11/28/2019 Tumor Marker   Patient's tumor was tested for the following markers: CA-125 Results of the tumor marker test revealed 5.8    12/20/2019 Imaging   1. Significant interval reduction in mixed solid and cystic nodule in the vicinity of the left ovary, with significant improvement or resolution of multiple pelvic, peritoneal, and organ capsule implants. Resolution of previously seen small volume ascites. Findings are consistent with treatment response of abdominal metastatic disease. 2. Status post hysterectomy, oophorectomy, and omentectomy. 3. Nonobstructive left nephrolithiasis. 4. Aortic Atherosclerosis (ICD10-I70.0).   12/23/2019 Tumor Marker   Patient's tumor was tested for the following markers: CA-125 Results of the tumor marker test revealed 5.0     REVIEW OF SYSTEMS:   Constitutional: Denies fevers, chills or abnormal weight loss Eyes: Denies blurriness of vision Ears, nose, mouth, throat, and face: Denies mucositis or sore throat Respiratory: Denies cough, dyspnea or wheezes Cardiovascular: Denies palpitation, chest discomfort or lower extremity swelling Gastrointestinal:  Denies nausea, heartburn or change in bowel habits Skin: Denies abnormal skin rashes Lymphatics: Denies new lymphadenopathy or easy bruising Neurological:Denies numbness, tingling or new weaknesses Behavioral/Psych: Mood is stable, no new changes  All other systems were reviewed with the patient and are negative.  I have reviewed the past medical history, past surgical history, social history and family history with the patient and they are unchanged from previous note.  ALLERGIES:  is allergic to skin adhesives [cyanoacrylate], shrimp [shellfish allergy], and sulfonamide derivatives.  MEDICATIONS:  Current Outpatient Medications  Medication Sig Dispense Refill  . ALPRAZolam (XANAX) 0.25 MG tablet Take 0.25 mg by mouth at bedtime as needed for anxiety.    . carboxymethylcellulose (REFRESH PLUS) 0.5 % SOLN Place 1 drop into both eyes 2 (two) times daily as needed (dry eyes).    . Cholecalciferol (VITAMIN D3) 25 MCG (1000 UT) CAPS  Take 1,000 Units by mouth daily.     [name] Kitchen esomeprazole (NEXIUM) 20 MG  capsule Take 20 mg by mouth daily.     [name] Kitchen lidocaine-prilocaine (EMLA) cream Apply 1 application topically as needed. 30 g 11  . loratadine (CLARITIN) 10 MG tablet Take 10 mg by mouth daily as needed for allergies.    . Multiple Vitamin (MULTIVITAMIN WITH MINERALS) TABS tablet Take 1 tablet by mouth daily.    . ondansetron (ZOFRAN) 8 MG tablet Take 8 mg by mouth every 8 (eight) hours as needed.    . prochlorperazine (COMPAZINE) 10 MG tablet Take 10 mg by mouth every 6 (six) hours as needed.     No current facility-administered medications for this visit.    PHYSICAL EXAMINATION: ECOG PERFORMANCE STATUS: 1 - Symptomatic but completely ambulatory  Vitals:   01/13/20 1228  BP: 135/75  Pulse: 73  Resp: 18  Temp: 97.7 F (36.5 C)  SpO2: 100%   Filed Weights   01/13/20 1228  Weight: 148 lb 3.2 oz (67.2 kg)    GENERAL:alert, no distress and comfortable SKIN: skin color, texture, turgor are normal, no rashes or significant lesions EYES: normal, Conjunctiva are pink and non-injected, sclera clear OROPHARYNX:no exudate, no erythema and lips, buccal mucosa, and tongue normal  NECK: supple, thyroid normal size, non-tender, without nodularity LYMPH:  no palpable lymphadenopathy in the cervical, axillary or inguinal LUNGS: clear to auscultation and percussion with normal breathing effort HEART: regular rate & rhythm and no murmurs and no lower extremity edema ABDOMEN:abdomen soft, non-tender and normal bowel sounds Musculoskeletal:no cyanosis of digits and no clubbing  NEURO: alert & oriented x 3 with fluent speech, no focal motor/sensory deficits  LABORATORY DATA:  I have reviewed the data as listed    Component Value Date/Time   NA 141 01/13/2020 1213   K 4.0 01/13/2020 1213   CL 108 01/13/2020 1213   CO2 25 01/13/2020 1213   GLUCOSE 89 01/13/2020 1213   BUN 14 01/13/2020 1213   CREATININE 0.63 01/13/2020 1213    CALCIUM 9.3 01/13/2020 1213   PROT 6.2 (L) 01/13/2020 1213   ALBUMIN 3.6 01/13/2020 1213   AST 43 (H) 01/13/2020 1213   ALT 74 (H) 01/13/2020 1213   ALKPHOS 163 (H) 01/13/2020 1213   BILITOT 1.1 01/13/2020 1213   GFRNONAA >60 01/13/2020 1213   GFRAA >60 01/13/2020 1213    No results found for: SPEP, UPEP  Lab Results  Component Value Date   WBC 5.2 01/13/2020   NEUTROABS 3.2 01/13/2020   HGB 11.0 (L) 01/13/2020   HCT 32.7 (L) 01/13/2020   MCV 100.3 (H) 01/13/2020   PLT 278 01/13/2020      Chemistry      Component Value Date/Time   NA 141 01/13/2020 1213   K 4.0 01/13/2020 1213   CL 108 01/13/2020 1213   CO2 25 01/13/2020 1213   BUN 14 01/13/2020 1213   CREATININE 0.63 01/13/2020 1213      Component Value Date/Time   CALCIUM 9.3 01/13/2020 1213   ALKPHOS 163 (H) 01/13/2020 1213   AST 43 (H) 01/13/2020 1213   ALT 74 (H) 01/13/2020 1213   BILITOT 1.1 01/13/2020 1213

## 2020-01-13 NOTE — Assessment & Plan Note (Signed)
She has intermittent elevated liver enzymes Could be due to chemo side effects versus fatty liver CT imaging show near complete resolution of liver lesions Observe only for now

## 2020-01-13 NOTE — Assessment & Plan Note (Signed)
She has significant pancytopenia on day 8 of every cycle along with elevated LFTs I recommend we change her treatment course to be treated on days 1 and 15, rest day 8 and 22, for cycle of every 28 days She agreed with the plan of care

## 2020-01-13 NOTE — Progress Notes (Signed)
   Covid-19 Vaccination Clinic  Name:  Stacy Rivas    MRN: [id number] DOB: 10-03-1955  01/13/2020  [name] was observed post Covid-19 immunization for 15 minutes without incident. She was provided with Vaccine Information Sheet and instruction to access the V-Safe system.   [name] was instructed to call 911 with any severe reactions post vaccine: [name] Kitchen Difficulty breathing  . Swelling of face and throat  . A fast heartbeat  . A bad rash all over body  . Dizziness and weakness   Immunizations Administered    Name Date Dose VIS Date Route   Pfizer COVID-19 Vaccine 01/13/2020  3:32 PM 0.3 mL 07/17/2018 Intramuscular   Manufacturer: [employer]   Lot: [id number]   [place]: [id number]

## 2020-01-20 ENCOUNTER — Inpatient Hospital Stay: Payer: Managed Care, Other (non HMO)

## 2020-01-24 ENCOUNTER — Inpatient Hospital Stay: Payer: Managed Care, Other (non HMO) | Attending: Gynecologic Oncology

## 2020-01-24 ENCOUNTER — Other Ambulatory Visit: Payer: Self-pay

## 2020-01-24 ENCOUNTER — Inpatient Hospital Stay: Payer: Managed Care, Other (non HMO)

## 2020-01-24 VITALS — BP 150/90 | HR 70 | Temp 97.9°F | Resp 16

## 2020-01-24 DIAGNOSIS — Z79899 Other long term (current) drug therapy: Secondary | ICD-10-CM | POA: Diagnosis not present

## 2020-01-24 DIAGNOSIS — Z23 Encounter for immunization: Secondary | ICD-10-CM | POA: Diagnosis not present

## 2020-01-24 DIAGNOSIS — C786 Secondary malignant neoplasm of retroperitoneum and peritoneum: Secondary | ICD-10-CM

## 2020-01-24 DIAGNOSIS — C562 Malignant neoplasm of left ovary: Secondary | ICD-10-CM

## 2020-01-24 DIAGNOSIS — C569 Malignant neoplasm of unspecified ovary: Secondary | ICD-10-CM

## 2020-01-24 DIAGNOSIS — Z7189 Other specified counseling: Secondary | ICD-10-CM

## 2020-01-24 DIAGNOSIS — Z5111 Encounter for antineoplastic chemotherapy: Secondary | ICD-10-CM | POA: Diagnosis present

## 2020-01-24 LAB — CMP (CANCER CENTER ONLY)
ALT: 87 U/L — ABNORMAL HIGH (ref 0–44)
AST: 59 U/L — ABNORMAL HIGH (ref 15–41)
Albumin: 3.6 g/dL (ref 3.5–5.0)
Alkaline Phosphatase: 136 U/L — ABNORMAL HIGH (ref 38–126)
Anion gap: 9 (ref 5–15)
BUN: 11 mg/dL (ref 8–23)
CO2: 24 mmol/L (ref 22–32)
Calcium: 9.1 mg/dL (ref 8.9–10.3)
Chloride: 109 mmol/L (ref 98–111)
Creatinine: 0.61 mg/dL (ref 0.44–1.00)
GFR, Est AFR Am: >60 mL/min (ref >60)
GFR, Estimated: >60 mL/min (ref >60)
Glucose, Bld: 105 mg/dL — ABNORMAL HIGH (ref 70–99)
Potassium: 3.6 mmol/L (ref 3.5–5.1)
Sodium: 142 mmol/L (ref 135–145)
Total Bilirubin: 1 mg/dL (ref 0.3–1.2)
Total Protein: 6.2 g/dL — ABNORMAL LOW (ref 6.5–8.1)

## 2020-01-24 LAB — CBC WITH DIFFERENTIAL (CANCER CENTER ONLY)
Abs Immature Granulocytes: 0.01 10*3/uL (ref 0.00–0.07)
Basophils Absolute: 0 10*3/uL (ref 0.0–0.1)
Basophils Relative: 0 %
Eosinophils Absolute: 0.1 10*3/uL (ref 0.0–0.5)
Eosinophils Relative: 2 %
HCT: 33.1 % — ABNORMAL LOW (ref 36.0–46.0)
Hemoglobin: 11.2 g/dL — ABNORMAL LOW (ref 12.0–15.0)
Immature Granulocytes: 0 %
Lymphocytes Relative: 29 %
Lymphs Abs: 1.2 10*3/uL (ref 0.7–4.0)
MCH: 33.8 pg (ref 26.0–34.0)
MCHC: 33.8 g/dL (ref 30.0–36.0)
MCV: 100 fL (ref 80.0–100.0)
Monocytes Absolute: 0.6 10*3/uL (ref 0.1–1.0)
Monocytes Relative: 15 %
Neutro Abs: 2.1 10*3/uL (ref 1.7–7.7)
Neutrophils Relative %: 54 %
Platelet Count: 160 10*3/uL (ref 150–400)
RBC: 3.31 MIL/uL — ABNORMAL LOW (ref 3.87–5.11)
RDW: 15 % (ref 11.5–15.5)
WBC Count: 4 10*3/uL (ref 4.0–10.5)
nRBC: 0 % (ref 0.0–0.2)

## 2020-01-24 MED ORDER — SODIUM CHLORIDE 0.9% FLUSH
10.0000 mL | Freq: Once | INTRAVENOUS | Status: AC
  Administered 2020-01-24: 10 mL
  Filled 2020-01-24: qty 10

## 2020-01-24 MED ORDER — PROCHLORPERAZINE MALEATE 10 MG PO TABS
ORAL_TABLET | ORAL | Status: AC
  Filled 2020-01-24: qty 1

## 2020-01-24 MED ORDER — PROCHLORPERAZINE MALEATE 10 MG PO TABS
10.0000 mg | ORAL_TABLET | Freq: Once | ORAL | Status: AC
  Administered 2020-01-24: 10 mg via ORAL

## 2020-01-24 MED ORDER — SODIUM CHLORIDE 0.9 % IV SOLN
480.0000 mg/m2 | Freq: Once | INTRAVENOUS | Status: AC
  Administered 2020-01-24: 798 mg via INTRAVENOUS
  Filled 2020-01-24: qty 20.99

## 2020-01-24 MED ORDER — SODIUM CHLORIDE 0.9% FLUSH
10.0000 mL | INTRAVENOUS | Status: DC | PRN
  Administered 2020-01-24: 10 mL
  Filled 2020-01-24: qty 10

## 2020-01-24 MED ORDER — HEPARIN SOD (PORK) LOCK FLUSH 100 UNIT/ML IV SOLN
500.0000 [IU] | Freq: Once | INTRAVENOUS | Status: AC | PRN
  Administered 2020-01-24: 500 [IU]
  Filled 2020-01-24: qty 5

## 2020-01-24 MED ORDER — SODIUM CHLORIDE 0.9 % IV SOLN
Freq: Once | INTRAVENOUS | Status: AC
  Filled 2020-01-24: qty 250

## 2020-01-24 NOTE — Patient Instructions (Signed)
[facility] Discharge Instructions for Patients Receiving Chemotherapy  Today you received the following chemotherapy agents: gemcitabine.  To help prevent nausea and vomiting after your treatment, we encourage you to take your nausea medication as directed.   If you develop nausea and vomiting that is not controlled by your nausea medication, call the clinic.   BELOW ARE SYMPTOMS THAT SHOULD BE REPORTED IMMEDIATELY:  *FEVER GREATER THAN 100.5 F  *CHILLS WITH OR WITHOUT FEVER  NAUSEA AND VOMITING THAT IS NOT CONTROLLED WITH YOUR NAUSEA MEDICATION  *UNUSUAL SHORTNESS OF BREATH  *UNUSUAL BRUISING OR BLEEDING  TENDERNESS IN MOUTH AND THROAT WITH OR WITHOUT PRESENCE OF ULCERS  *URINARY PROBLEMS  *BOWEL PROBLEMS  UNUSUAL RASH Items with * indicate a potential emergency and should be followed up as soon as possible.  Feel free to call the clinic should you have any questions or concerns. The clinic phone number is [phone].  Please show the CHEMO ALERT CARD at check-in to the Emergency Department and triage nurse.   

## 2020-01-24 NOTE — Progress Notes (Signed)
Per Dr Gorsuch, ok to treat with elevated LFT's. 

## 2020-01-25 LAB — CA 125: Cancer Antigen (CA) 125: 4.8 U/mL (ref 0.0–38.1)

## 2020-02-10 ENCOUNTER — Inpatient Hospital Stay: Payer: Managed Care, Other (non HMO)

## 2020-02-10 ENCOUNTER — Encounter: Payer: Self-pay | Admitting: Hematology and Oncology

## 2020-02-10 ENCOUNTER — Other Ambulatory Visit: Payer: Self-pay

## 2020-02-10 ENCOUNTER — Inpatient Hospital Stay (HOSPITAL_BASED_OUTPATIENT_CLINIC_OR_DEPARTMENT_OTHER): Payer: Managed Care, Other (non HMO) | Admitting: Hematology and Oncology

## 2020-02-10 ENCOUNTER — Telehealth: Payer: Self-pay | Admitting: Hematology and Oncology

## 2020-02-10 VITALS — BP 159/76 | HR 61 | Temp 97.0°F | Resp 18 | Ht 62.0 in | Wt 147.8 lb

## 2020-02-10 DIAGNOSIS — N951 Menopausal and female climacteric states: Secondary | ICD-10-CM | POA: Diagnosis not present

## 2020-02-10 DIAGNOSIS — R748 Abnormal levels of other serum enzymes: Secondary | ICD-10-CM | POA: Diagnosis not present

## 2020-02-10 DIAGNOSIS — R300 Dysuria: Secondary | ICD-10-CM

## 2020-02-10 DIAGNOSIS — Z7189 Other specified counseling: Secondary | ICD-10-CM

## 2020-02-10 DIAGNOSIS — C562 Malignant neoplasm of left ovary: Secondary | ICD-10-CM | POA: Diagnosis not present

## 2020-02-10 DIAGNOSIS — Z5111 Encounter for antineoplastic chemotherapy: Secondary | ICD-10-CM | POA: Diagnosis not present

## 2020-02-10 DIAGNOSIS — Z789 Other specified health status: Secondary | ICD-10-CM

## 2020-02-10 DIAGNOSIS — C786 Secondary malignant neoplasm of retroperitoneum and peritoneum: Secondary | ICD-10-CM

## 2020-02-10 LAB — CMP (CANCER CENTER ONLY)
ALT: 50 U/L — ABNORMAL HIGH (ref 0–44)
AST: 30 U/L (ref 15–41)
Albumin: 3.9 g/dL (ref 3.5–5.0)
Alkaline Phosphatase: 101 U/L (ref 38–126)
Anion gap: 9 (ref 5–15)
BUN: 10 mg/dL (ref 8–23)
CO2: 26 mmol/L (ref 22–32)
Calcium: 9.1 mg/dL (ref 8.9–10.3)
Chloride: 108 mmol/L (ref 98–111)
Creatinine: 0.63 mg/dL (ref 0.44–1.00)
GFR, Est AFR Am: >60 mL/min (ref >60)
GFR, Estimated: >60 mL/min (ref >60)
Glucose, Bld: 90 mg/dL (ref 70–99)
Potassium: 3.4 mmol/L — ABNORMAL LOW (ref 3.5–5.1)
Sodium: 143 mmol/L (ref 135–145)
Total Bilirubin: 1.9 mg/dL — ABNORMAL HIGH (ref 0.3–1.2)
Total Protein: 6.5 g/dL (ref 6.5–8.1)

## 2020-02-10 LAB — URINALYSIS, COMPLETE (UACMP) WITH MICROSCOPIC
Bacteria, UA: NONE SEEN
Bilirubin Urine: NEGATIVE
Glucose, UA: NEGATIVE mg/dL
Hgb urine dipstick: NEGATIVE
Ketones, ur: NEGATIVE mg/dL
Leukocytes,Ua: NEGATIVE
Nitrite: NEGATIVE
Protein, ur: NEGATIVE mg/dL
Specific Gravity, Urine: 1.005 (ref 1.005–1.030)
pH: 7 (ref 5.0–8.0)

## 2020-02-10 LAB — CBC WITH DIFFERENTIAL (CANCER CENTER ONLY)
Abs Immature Granulocytes: 0.01 10*3/uL (ref 0.00–0.07)
Basophils Absolute: 0 10*3/uL (ref 0.0–0.1)
Basophils Relative: 1 %
Eosinophils Absolute: 0.1 10*3/uL (ref 0.0–0.5)
Eosinophils Relative: 2 %
HCT: 35.5 % — ABNORMAL LOW (ref 36.0–46.0)
Hemoglobin: 12.1 g/dL (ref 12.0–15.0)
Immature Granulocytes: 0 %
Lymphocytes Relative: 22 %
Lymphs Abs: 1 10*3/uL (ref 0.7–4.0)
MCH: 34.5 pg — ABNORMAL HIGH (ref 26.0–34.0)
MCHC: 34.1 g/dL (ref 30.0–36.0)
MCV: 101.1 fL — ABNORMAL HIGH (ref 80.0–100.0)
Monocytes Absolute: 0.6 10*3/uL (ref 0.1–1.0)
Monocytes Relative: 12 %
Neutro Abs: 3 10*3/uL (ref 1.7–7.7)
Neutrophils Relative %: 63 %
Platelet Count: 307 10*3/uL (ref 150–400)
RBC: 3.51 MIL/uL — ABNORMAL LOW (ref 3.87–5.11)
RDW: 13.8 % (ref 11.5–15.5)
WBC Count: 4.8 10*3/uL (ref 4.0–10.5)
nRBC: 0 % (ref 0.0–0.2)

## 2020-02-10 MED ORDER — INFLUENZA VAC A&B SA ADJ QUAD 0.5 ML IM PRSY
0.5000 mL | PREFILLED_SYRINGE | Freq: Once | INTRAMUSCULAR | Status: AC
  Administered 2020-02-10: 0.5 mL via INTRAMUSCULAR

## 2020-02-10 MED ORDER — SODIUM CHLORIDE 0.9% FLUSH
10.0000 mL | INTRAVENOUS | Status: DC | PRN
  Filled 2020-02-10: qty 10

## 2020-02-10 MED ORDER — ESTRADIOL 0.1 MG/GM VA CREA: 1.0000 | TOPICAL_CREAM | VAGINAL | 12 refills | Status: DC

## 2020-02-10 MED ORDER — PROCHLORPERAZINE MALEATE 10 MG PO TABS
10.0000 mg | ORAL_TABLET | Freq: Once | ORAL | Status: AC
  Administered 2020-02-10: 10 mg via ORAL

## 2020-02-10 MED ORDER — SODIUM CHLORIDE 0.9 % IV SOLN
Freq: Once | INTRAVENOUS | Status: AC
  Filled 2020-02-10: qty 250

## 2020-02-10 MED ORDER — SODIUM CHLORIDE 0.9% FLUSH
10.0000 mL | Freq: Once | INTRAVENOUS | Status: AC
  Administered 2020-02-10: 10 mL
  Filled 2020-02-10: qty 10

## 2020-02-10 MED ORDER — HEPARIN SOD (PORK) LOCK FLUSH 100 UNIT/ML IV SOLN
500.0000 [IU] | Freq: Once | INTRAVENOUS | Status: DC | PRN
  Filled 2020-02-10: qty 5

## 2020-02-10 MED ORDER — INFLUENZA VAC A&B SA ADJ QUAD 0.5 ML IM PRSY
PREFILLED_SYRINGE | INTRAMUSCULAR | Status: AC
  Filled 2020-02-10: qty 0.5

## 2020-02-10 MED ORDER — SODIUM CHLORIDE 0.9 % IV SOLN
480.0000 mg/m2 | Freq: Once | INTRAVENOUS | Status: AC
  Administered 2020-02-10: 798 mg via INTRAVENOUS
  Filled 2020-02-10: qty 20.99

## 2020-02-10 MED ORDER — PROCHLORPERAZINE MALEATE 10 MG PO TABS
ORAL_TABLET | ORAL | Status: AC
  Filled 2020-02-10: qty 1

## 2020-02-10 NOTE — Assessment & Plan Note (Signed)
She tolerated treatment very well without major side effects We will proceed with treatment as scheduled I recommend repeat CT imaging before I see her back next month

## 2020-02-10 NOTE — Patient Instructions (Signed)

## 2020-02-10 NOTE — Telephone Encounter (Signed)
Scheduled appts per 9/20 sch msg. Gave pt a print out of AVS.

## 2020-02-10 NOTE — Patient Instructions (Signed)
[facility] Discharge Instructions for Patients Receiving Chemotherapy  Today you received the following chemotherapy agents: gemcitabine.  To help prevent nausea and vomiting after your treatment, we encourage you to take your nausea medication as directed.   If you develop nausea and vomiting that is not controlled by your nausea medication, call the clinic.   BELOW ARE SYMPTOMS THAT SHOULD BE REPORTED IMMEDIATELY:  *FEVER GREATER THAN 100.5 F  *CHILLS WITH OR WITHOUT FEVER  NAUSEA AND VOMITING THAT IS NOT CONTROLLED WITH YOUR NAUSEA MEDICATION  *UNUSUAL SHORTNESS OF BREATH  *UNUSUAL BRUISING OR BLEEDING  TENDERNESS IN MOUTH AND THROAT WITH OR WITHOUT PRESENCE OF ULCERS  *URINARY PROBLEMS  *BOWEL PROBLEMS  UNUSUAL RASH Items with * indicate a potential emergency and should be followed up as soon as possible.  Feel free to call the clinic should you have any questions or concerns. The clinic phone number is [phone].  Please show the [place] at check-in to the Emergency Department and triage nurse.  Influenza Virus Vaccine injection What is this medicine? INFLUENZA VIRUS VACCINE (in floo EN zuh VAHY ruhs vak SEEN) helps to reduce the risk of getting influenza also known as the flu. The vaccine only helps protect you against some strains of the flu. This medicine may be used for other purposes; ask your health care provider or pharmacist if you have questions. COMMON BRAND NAME(S): Afluria, Afluria Quadrivalent, Agriflu, Alfuria, FLUAD, Fluarix, Fluarix Quadrivalent, Flublok, Flublok Quadrivalent, FLUCELVAX, FLUCELVAX Quadrivalent, Flulaval, Flulaval Quadrivalent, Fluvirin, Fluzone, Fluzone High-Dose, Fluzone Intradermal, Fluzone Quadrivalent What should I tell my health care provider before I take this medicine? They need to know if you have any of these conditions:  bleeding disorder like hemophilia  fever or infection  Guillain-Barre  syndrome or other neurological problems  immune system problems  infection with the human immunodeficiency virus (HIV) or AIDS  low blood platelet counts  multiple sclerosis  an unusual or allergic reaction to influenza virus vaccine, latex, other medicines, foods, dyes, or preservatives. Different brands of vaccines contain different allergens. Some may contain latex or eggs. Talk to your doctor about your allergies to make sure that you get the right vaccine.  pregnant or trying to get pregnant  breast-feeding How should I use this medicine? This vaccine is for injection into a muscle or under the skin. It is given by a health care professional. A copy of Vaccine Information Statements will be given before each vaccination. Read this sheet carefully each time. The sheet may change frequently. Talk to your healthcare provider to see which vaccines are right for you. Some vaccines should not be used in all age groups. Overdosage: If you think you have taken too much of this medicine contact a poison control center or emergency room at once. NOTE: This medicine is only for you. Do not share this medicine with others. What if I miss a dose? This does not apply. What may interact with this medicine?  chemotherapy or radiation therapy  medicines that lower your immune system like etanercept, anakinra, infliximab, and adalimumab  medicines that treat or prevent blood clots like warfarin  phenytoin  steroid medicines like prednisone or cortisone  theophylline  vaccines This list may not describe all possible interactions. Give your health care provider a list of all the medicines, herbs, non-prescription drugs, or dietary supplements you use. Also tell them if you smoke, drink alcohol, or use illegal drugs. Some items may interact with your medicine. What should  I watch for while using this medicine? Report any side effects that do not go away within 3 days to your doctor or health  care professional. Call your health care provider if any unusual symptoms occur within 6 weeks of receiving this vaccine. You may still catch the flu, but the illness is not usually as bad. You cannot get the flu from the vaccine. The vaccine will not protect against colds or other illnesses that may cause fever. The vaccine is needed every year. What side effects may I notice from receiving this medicine? Side effects that you should report to your doctor or health care professional as soon as possible:  allergic reactions like skin rash, itching or hives, swelling of the face, lips, or tongue Side effects that usually do not require medical attention (report to your doctor or health care professional if they continue or are bothersome):  fever  headache  muscle aches and pains  pain, tenderness, redness, or swelling at the injection site  tiredness This list may not describe all possible side effects. Call your doctor for medical advice about side effects. You may report side effects to FDA at 1-800-FDA-1088. Where should I keep my medicine? The vaccine will be given by a health care professional in a clinic, pharmacy, doctor's office, or other health care setting. You will not be given vaccine doses to store at home. NOTE: This sheet is a summary. It may not cover all possible information. If you have questions about this medicine, talk to your doctor, pharmacist, or health care provider.  2020 Elsevier/Gold Standard (2018-04-03 08:45:43)

## 2020-02-10 NOTE — Assessment & Plan Note (Signed)
She has some symptoms of dysuria I suspect it could be either vaginitis versus urinary tract infection I will order urinalysis and urine culture I will call her with test results If urinalysis or urine culture were negative, I recommend a trial of estrogen cream topically to be put in the vagina several times a week She is in agreement to try

## 2020-02-10 NOTE — Progress Notes (Signed)
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[facility]) She tolerated treatment very well without major side effects We will proceed with treatment as scheduled I recommend repeat CT imaging before I see her back next month  Dysuria She has some symptoms of dysuria I suspect it could be either vaginitis versus urinary tract infection I will order urinalysis and urine culture I will call her with test results If urinalysis or urine culture were negative, I recommend a trial of estrogen cream topically to be put in the vagina several times a week She is in agreement to try  Elevated liver enzymes She has intermittent elevated liver enzymes Could be due to chemo side effects versus fatty liver CT imaging show near complete resolution of liver lesions Observe only for now   Orders Placed This Encounter  Procedures  . Urine Culture    Standing Status:   Future    Number of Occurrences:   1    Standing Expiration Date:   02/09/2021  . CT ABDOMEN PELVIS W CONTRAST    Standing Status:   Future    Standing Expiration Date:   02/09/2021    Order Specific Question:   If indicated for the ordered procedure, I authorize the administration of contrast media per Radiology protocol    Answer:   Yes    Order Specific Question:   Preferred imaging location?    Answer:   [facility]    Order Specific Question:   Radiology Contrast Protocol - do NOT remove file path    Answer:   [email or web address]  . Urinalysis, Complete w Microscopic    Standing Status:   Future    Number of Occurrences:   1    Standing Expiration Date:   02/09/2021    All questions were answered. The patient knows to call the clinic with any problems, questions or concerns. The total time  spent in the appointment was 20 minutes encounter with patients including review of chart and various tests results, discussions about plan of care and coordination of care plan   Artis Delay, MD 02/10/2020 6:58 PM  INTERVAL HISTORY: Please see below for problem oriented charting. 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 14.7 cm poorly defined soft tissue mass in central pelvis suspicious for primary ovarian carcinoma, with uterine leiomyosarcoma considered a less likely differential diagnosis. 2. Moderate ascites and diffuse peritoneal carcinomatosis. 3. Several small uterine fibroids.   11/13/2018 Tumor Marker   Patient's tumor was tested for the following markers: CA-125 Results of the tumor marker test revealed 1434   11/21/2018 Procedure   Successful ultrasound-guided diagnostic and therapeutic paracentesis yielding 3.1 liters of peritoneal fluid   11/21/2018 Pathology Results   PERITONEAL/ASCITIC FLUID(SPECIMEN 1 OF 1 COLLECTED 11/21/18): ADENOCARCINOMA. Specimen Clinical Information Pelvic mass suspicious for ovarian cancer Source Peritoneal/Ascitic Fluid, (specimen 1 of 1 collected 11/21/18) Gross Specimen: Received is/are 1000 ccs of dark amber fluid. (CM:cm) Prepared: # Smears: 0 # Concentration Technique Slides (i.e. ThinPrep):  1 # Cell Block: 1 Additional Studies: n/a Comment Comment: The cytologic features are most consistent with serous carcinoma.   11/29/2018 Initial Diagnosis   Ovarian cancer ([place])   11/29/2018 Cancer Staging   Staging form: Ovary, Fallopian Tube, and Primary Peritoneal Carcinoma, AJCC 8th  Edition - Clinical: cT3, cN0, cM0 - Signed by [name] Lark, MD on 11/29/2018   12/03/2018 Procedure   Successful ultrasound-guided therapeutic paracentesis yielding 3.7 liters of peritoneal fluid.     12/06/2018 Procedure   Placement of a subcutaneous port device. Catheter tip at the SVC and right atrium junction   12/14/2018 Procedure   Successful ultrasound-guided paracentesis yielding 2.9 L of peritoneal fluid   12/28/2018 Tumor Marker   Patient's tumor was tested for the following markers: CA-125 Results of the tumor marker test revealed 812.   12/28/2018 - 04/22/2019 Chemotherapy   The patient had carboplatin and taxol for neoadjuvant treatment, followed by interval debulking surgery and subsequent adjuvant chemotherapy treatment.     02/01/2019 Imaging   Ct abdomen and pelvis 8.6 cm left ovarian mass, corresponding to the patient's known primary neoplasm, improved.   Mild peritoneal nodularity/omental caking, improved.   Small abdominopelvic ascites, improved.     02/01/2019 Tumor Marker   Patient's tumor was tested for the following markers: CA-125 Results of the tumor marker test revealed 183.   02/12/2019 Surgery   Preoperative Diagnosis: Stage IIIC ovarian cancer, s/p neoadjuvant chemotherapy      Procedure(s) Performed: 1. Exploratory laparotomy with total abdominal hysterectomy, bilateral salpingo-oophorectomy, omentectomy radical tumor debulking for ovarian cancer.   Surgeon: Thereasa Solo, MD.    Operative Findings: upper abdomen free of disease. No visible omental disease. Small volume (200cc) ascites. 8cm friable mass replacing left ovary and adherent to the sigmoid colon mesentery and ureter on the left.  Anterior fibroid. This represented an optimal cytoreduction (R0) with no gross visible disease remaining.    02/12/2019 Pathology Results   A. OVARY AND FALLOPIAN TUBE, LEFT, SALPINGO-OOPHORECTOMY: - Serous carcinoma, high grade, status post neoadjuvant therapy - See  oncology table and comment below B. UTERUS CERVIX WITH RIGHT FALLOPIAN TUBE AND OVARY, HYSTERECTOMY: Uterus: - Serosal surface involved by serous carcinoma - Endomyometrium uninvolved by carcinoma - Benign endometrial polyp (4.1 cm) - Leiomyomata (5.5 cm; largest) - Adenomyosis Cervix: - Uninvolved by carcinoma Left ovary: - Serous carcinoma, high grade Left fallopian tube: - Serous carcinoma, high grade C. SOFT TISSUE, LEFT PELVIC SIDEWALL TUMOR, EXCISION: - Metastatic serous carcinoma, high-grade D. SOFT TISSUE, SIGMOID COLON MESENTERY, EXCISION: - Metastatic serous carcinoma, high-grade E. OMENTUM, TUMOR RESECTION: - Metastatic serous carcinoma, high-grade OVARY or FALLOPIAN TUBE or PRIMARY PERITONEUM: Procedure: Total hysterectomy and bilateral salpingo-oophorectomy, omentectomy and peritoneal biopsies Specimen Integrity: Fragmented Tumor Site: Left ovary and fallopian tube Ovarian Surface Involvement: Present Fallopian Tube Surface Involvement: Present Tumor Size: 6.3 cm (see comment) Histologic Type: Serous carcinoma Histologic Grade: High-grade Implants: Not applicable Other Tissue/ Organ Involvement: Right ovary, right fallopian tube, omentum, mesentery Largest Extrapelvic Peritoneal Focus: Macroscopic Peritoneal/Ascitic Fluid: Malignant Treatment Effect: No definite or minimal response identified (chemotherapy response score 1 [CRS1] Regional Lymph Nodes: No lymph nodes submitted or found Pathologic Stage Classification (pTNM, AJCC 8th Edition): pT3c, pNX Representative Tumor Block: A4 Comment(s): Additional testing (HER-2, MMR and MSI) are pending. The primary tumor site appears to be the left ovary and fallopian tube. The uterus is only involved on the serosal surface. There is tumor on the anterior peritoneal reflection.  Addendum: Tumor is Her2 negative,MSI  stable   02/19/2019 Tumor Marker   Patient's tumor was tested for the following markers: CA-125 Results of  the tumor marker test revealed 40.3   04/01/2019 Tumor Marker   Patient's tumor was tested for the following markers: CA-125 Results of the tumor marker test revealed 8.1    Genetic Testing   Negative testing. No pathogenic variants identified on the [employer]. The report date is 04/17/2019.  Somatic genes analyzed through TumorNext-[facility]: ATM, BARD1, BRCA1, BRCA2, BRIP1, CHEK2, MRE11A, NBN, PALB2, RAD51C, RAD51D.  The CancerNext gene panel offered by [employer] includes sequencing and rearrangement analysis for the following 36 genes: APC*, ATM*, AXIN2, BARD1, BMPR1A, BRCA1*, BRCA2*, BRIP1*, CDH1*, CDK4, CDKN2A, CHEK2*, DICER1, MLH1*, MSH2*, MSH3, MSH6*, MUTYH*, NBN, NF1*, NTHL1, PALB2*, PMS2*, PTEN*, RAD51C*, RAD51D*, RECQL, SMAD4, SMARCA4, STK11 and TP53* (sequencing and deletion/duplication); HOXB13, POLD1 and POLE (sequencing only); EPCAM and GREM1 (deletion/duplication only).    05/21/2019 Imaging   1. Interval hysterectomy, oophorectomy and omentectomy. No evidence of residual ovarian carcinoma in the pelvis. No evidence of peritoneal disease. No intraperitoneal free fluid. 2. Nonobstructing LEFT renal calculi.  Normal ureters.   05/22/2019 Tumor Marker   Patient's tumor was tested for the following markers: CA-125 Results of the tumor marker test revealed 5.7.   06/03/2019 Procedure   Successful right IJ vein Port-A-Cath explant.   08/26/2019 Tumor Marker   Patient's tumor was tested for the following markers: CA-125. Results of the tumor marker test revealed 4.9   10/03/2019 Imaging   1. New tumor deposits along the capsular surfaces of the liver, spleen, in the left pelvis, and right paracolic gutter, compatible with metastatic disease. 2. Mild dilation of the dorsal pancreatic duct in the pancreatic body and head, cause uncertain. 3. Nonobstructive left nephrolithiasis. 4. Aortic atherosclerosis.   Aortic Atherosclerosis (ICD10-I70.0   10/10/2019  Procedure   Successful placement of a right IJ approach Power Port with ultrasound and fluoroscopic guidance. The catheter is ready for use.     10/11/2019 Tumor Marker   Patient's tumor was tested for the following markers: CA-125 Results of the tumor marker test revealed 19.6.   10/14/2019 -  Chemotherapy   The patient had carboplatin and gemzar for chemotherapy treatment.     11/04/2019 Tumor Marker   Patient's tumor was tested for the following markers: CA-125 Results of the tumor marker test revealed 11.4   11/28/2019 Tumor Marker   Patient's tumor was tested for the following markers: CA-125 Results of the tumor marker test revealed 5.8   12/20/2019 Imaging   1. Significant interval reduction in mixed solid and cystic nodule in the vicinity of the left ovary, with significant improvement or resolution of multiple pelvic, peritoneal, and organ capsule implants. Resolution of previously seen small volume ascites. Findings are consistent with treatment response of abdominal metastatic disease. 2. Status post hysterectomy, oophorectomy, and omentectomy. 3. Nonobstructive left nephrolithiasis. 4. Aortic Atherosclerosis (ICD10-I70.0).   12/23/2019 Tumor Marker   Patient's tumor was tested for the following markers: CA-125 Results of the tumor marker test revealed 5.0   01/24/2020 Tumor Marker   Patient's tumor was tested for the following markers: CA-125 Results of the tumor marker test revealed 4.8     REVIEW OF SYSTEMS:   Constitutional: Denies fevers, chills or abnormal weight loss Eyes: Denies blurriness of vision Ears, nose, mouth, throat, and face: Denies mucositis or sore throat Respiratory: Denies cough, dyspnea or wheezes Cardiovascular: Denies palpitation, chest discomfort or lower extremity swelling Gastrointestinal:  Denies nausea,  heartburn or change in bowel habits Skin: Denies abnormal skin rashes Lymphatics: Denies new lymphadenopathy or easy  bruising Neurological:Denies numbness, tingling or new weaknesses Behavioral/Psych: Mood is stable, no new changes  All other systems were reviewed with the patient and are negative.  I have reviewed the past medical history, past surgical history, social history and family history with the patient and they are unchanged from previous note.  ALLERGIES:  is allergic to skin adhesives [cyanoacrylate], shrimp [shellfish allergy], and sulfonamide derivatives.  MEDICATIONS:  Current Outpatient Medications  Medication Sig Dispense Refill  . ALPRAZolam (XANAX) 0.25 MG tablet Take 0.25 mg by mouth at bedtime as needed for anxiety.    . carboxymethylcellulose (REFRESH PLUS) 0.5 % SOLN Place 1 drop into both eyes 2 (two) times daily as needed (dry eyes).    . Cholecalciferol (VITAMIN D3) 25 MCG (1000 UT) CAPS Take 1,000 Units by mouth daily.     [name] Kitchen esomeprazole (NEXIUM) 20 MG capsule Take 20 mg by mouth daily.     [name] Kitchen estradiol (ESTRACE VAGINAL) 0.1 MG/GM vaginal cream Place 1 Applicatorful vaginally 3 (three) times a week. 42.5 g 12  . lidocaine-prilocaine (EMLA) cream Apply 1 application topically as needed. 30 g 11  . loratadine (CLARITIN) 10 MG tablet Take 10 mg by mouth daily as needed for allergies.    . Multiple Vitamin (MULTIVITAMIN WITH MINERALS) TABS tablet Take 1 tablet by mouth daily.    . ondansetron (ZOFRAN) 8 MG tablet Take 8 mg by mouth every 8 (eight) hours as needed.    . prochlorperazine (COMPAZINE) 10 MG tablet Take 10 mg by mouth every 6 (six) hours as needed.     No current facility-administered medications for this visit.    PHYSICAL EXAMINATION: ECOG PERFORMANCE STATUS: 1 - Symptomatic but completely ambulatory  Vitals:   02/10/20 0837  BP: (!) 159/76  Pulse: 61  Resp: 18  Temp: (!) 97 F (36.1 C)  SpO2: 100%   Filed Weights   02/10/20 0837  Weight: 147 lb 12.8 oz (67 kg)    GENERAL:alert, no distress and comfortable SKIN: skin color, texture, turgor are normal,  no rashes or significant lesions EYES: normal, Conjunctiva are pink and non-injected, sclera clear OROPHARYNX:no exudate, no erythema and lips, buccal mucosa, and tongue normal  NECK: supple, thyroid normal size, non-tender, without nodularity LYMPH:  no palpable lymphadenopathy in the cervical, axillary or inguinal LUNGS: clear to auscultation and percussion with normal breathing effort HEART: regular rate & rhythm and no murmurs and no lower extremity edema ABDOMEN:abdomen soft, non-tender and normal bowel sounds Musculoskeletal:no cyanosis of digits and no clubbing  NEURO: alert & oriented x 3 with fluent speech, no focal motor/sensory deficits  LABORATORY DATA:  I have reviewed the data as listed    Component Value Date/Time   NA 143 02/10/2020 0818   K 3.4 (L) 02/10/2020 0818   CL 108 02/10/2020 0818   CO2 26 02/10/2020 0818   GLUCOSE 90 02/10/2020 0818   BUN 10 02/10/2020 0818   CREATININE 0.63 02/10/2020 0818   CALCIUM 9.1 02/10/2020 0818   PROT 6.5 02/10/2020 0818   ALBUMIN 3.9 02/10/2020 0818   AST 30 02/10/2020 0818   ALT 50 (H) 02/10/2020 0818   ALKPHOS 101 02/10/2020 0818   BILITOT 1.9 (H) 02/10/2020 0818   GFRNONAA >60 02/10/2020 0818   GFRAA >60 02/10/2020 0818    No results found for: SPEP, UPEP  Lab Results  Component Value Date   WBC 4.8 02/10/2020  NEUTROABS 3.0 02/10/2020   HGB 12.1 02/10/2020   HCT 35.5 (L) 02/10/2020   MCV 101.1 (H) 02/10/2020   PLT 307 02/10/2020      Chemistry      Component Value Date/Time   NA 143 02/10/2020 0818   K 3.4 (L) 02/10/2020 0818   CL 108 02/10/2020 0818   CO2 26 02/10/2020 0818   BUN 10 02/10/2020 0818   CREATININE 0.63 02/10/2020 0818      Component Value Date/Time   CALCIUM 9.1 02/10/2020 0818   ALKPHOS 101 02/10/2020 0818   AST 30 02/10/2020 0818   ALT 50 (H) 02/10/2020 0818   BILITOT 1.9 (H) 02/10/2020 0818

## 2020-02-10 NOTE — Assessment & Plan Note (Signed)
She has intermittent elevated liver enzymes Could be due to chemo side effects versus fatty liver CT imaging show near complete resolution of liver lesions Observe only for now

## 2020-02-10 NOTE — Progress Notes (Signed)
Ok to treat with bilirubin of 1.9 today per Dr. Alvy Bimler

## 2020-02-11 ENCOUNTER — Telehealth: Payer: Self-pay

## 2020-02-11 LAB — URINE CULTURE: Culture: <10000 — AB

## 2020-02-11 NOTE — Telephone Encounter (Signed)
-----   Message from [name] Lark, MD sent at 02/11/2020 12:23 PM EDT ----- Regarding: let her know urine culture is negative, she can start and try estrogen cream

## 2020-02-11 NOTE — Telephone Encounter (Signed)
Called and given below message. She verbalized understanding. 

## 2020-02-20 MED FILL — ESTRADIOL 0.1 MG/GM CREA: 0.1 | 28 days supply | Qty: 43 | Fill #0

## 2020-02-24 ENCOUNTER — Inpatient Hospital Stay: Payer: Managed Care, Other (non HMO)

## 2020-02-24 ENCOUNTER — Other Ambulatory Visit: Payer: Self-pay

## 2020-02-24 ENCOUNTER — Inpatient Hospital Stay: Payer: Managed Care, Other (non HMO) | Attending: Gynecologic Oncology

## 2020-02-24 VITALS — BP 154/89 | HR 71 | Temp 98.0°F | Resp 18

## 2020-02-24 DIAGNOSIS — Z5111 Encounter for antineoplastic chemotherapy: Secondary | ICD-10-CM | POA: Diagnosis not present

## 2020-02-24 DIAGNOSIS — C562 Malignant neoplasm of left ovary: Secondary | ICD-10-CM

## 2020-02-24 DIAGNOSIS — Z7189 Other specified counseling: Secondary | ICD-10-CM

## 2020-02-24 DIAGNOSIS — C786 Secondary malignant neoplasm of retroperitoneum and peritoneum: Secondary | ICD-10-CM | POA: Insufficient documentation

## 2020-02-24 DIAGNOSIS — C569 Malignant neoplasm of unspecified ovary: Secondary | ICD-10-CM

## 2020-02-24 LAB — CMP (CANCER CENTER ONLY)
ALT: 74 U/L — ABNORMAL HIGH (ref 0–44)
AST: 52 U/L — ABNORMAL HIGH (ref 15–41)
Albumin: 3.8 g/dL (ref 3.5–5.0)
Alkaline Phosphatase: 105 U/L (ref 38–126)
Anion gap: 3 — ABNORMAL LOW (ref 5–15)
BUN: 12 mg/dL (ref 8–23)
CO2: 27 mmol/L (ref 22–32)
Calcium: 9.2 mg/dL (ref 8.9–10.3)
Chloride: 109 mmol/L (ref 98–111)
Creatinine: 0.64 mg/dL (ref 0.44–1.00)
GFR, Est AFR Am: >60 mL/min (ref >60)
GFR, Estimated: >60 mL/min (ref >60)
Glucose, Bld: 91 mg/dL (ref 70–99)
Potassium: 3.8 mmol/L (ref 3.5–5.1)
Sodium: 139 mmol/L (ref 135–145)
Total Bilirubin: 1.3 mg/dL — ABNORMAL HIGH (ref 0.3–1.2)
Total Protein: 6.5 g/dL (ref 6.5–8.1)

## 2020-02-24 LAB — CBC WITH DIFFERENTIAL (CANCER CENTER ONLY)
Abs Immature Granulocytes: 0.02 10*3/uL (ref 0.00–0.07)
Basophils Absolute: 0 10*3/uL (ref 0.0–0.1)
Basophils Relative: 0 %
Eosinophils Absolute: 0.1 10*3/uL (ref 0.0–0.5)
Eosinophils Relative: 2 %
HCT: 37.1 % (ref 36.0–46.0)
Hemoglobin: 12.2 g/dL (ref 12.0–15.0)
Immature Granulocytes: 0 %
Lymphocytes Relative: 21 %
Lymphs Abs: 1.1 10*3/uL (ref 0.7–4.0)
MCH: 33 pg (ref 26.0–34.0)
MCHC: 32.9 g/dL (ref 30.0–36.0)
MCV: 100.3 fL — ABNORMAL HIGH (ref 80.0–100.0)
Monocytes Absolute: 0.6 10*3/uL (ref 0.1–1.0)
Monocytes Relative: 12 %
Neutro Abs: 3.1 10*3/uL (ref 1.7–7.7)
Neutrophils Relative %: 65 %
Platelet Count: 155 10*3/uL (ref 150–400)
RBC: 3.7 MIL/uL — ABNORMAL LOW (ref 3.87–5.11)
RDW: 13.3 % (ref 11.5–15.5)
WBC Count: 4.9 10*3/uL (ref 4.0–10.5)
nRBC: 0 % (ref 0.0–0.2)

## 2020-02-24 MED ORDER — SODIUM CHLORIDE 0.9% FLUSH
10.0000 mL | INTRAVENOUS | Status: DC | PRN
  Administered 2020-02-24: 10 mL
  Filled 2020-02-24: qty 10

## 2020-02-24 MED ORDER — PROCHLORPERAZINE MALEATE 10 MG PO TABS
ORAL_TABLET | ORAL | Status: AC
  Filled 2020-02-24: qty 1

## 2020-02-24 MED ORDER — PROCHLORPERAZINE MALEATE 10 MG PO TABS
10.0000 mg | ORAL_TABLET | Freq: Once | ORAL | Status: AC
  Administered 2020-02-24: 10 mg via ORAL

## 2020-02-24 MED ORDER — SODIUM CHLORIDE 0.9% FLUSH
10.0000 mL | Freq: Once | INTRAVENOUS | Status: AC
  Administered 2020-02-24: 10 mL
  Filled 2020-02-24: qty 10

## 2020-02-24 MED ORDER — HEPARIN SOD (PORK) LOCK FLUSH 100 UNIT/ML IV SOLN
500.0000 [IU] | Freq: Once | INTRAVENOUS | Status: AC | PRN
  Administered 2020-02-24: 500 [IU]
  Filled 2020-02-24: qty 5

## 2020-02-24 MED ORDER — SODIUM CHLORIDE 0.9 % IV SOLN
480.0000 mg/m2 | Freq: Once | INTRAVENOUS | Status: AC
  Administered 2020-02-24: 798 mg via INTRAVENOUS
  Filled 2020-02-24: qty 20.99

## 2020-02-24 MED ORDER — SODIUM CHLORIDE 0.9 % IV SOLN
Freq: Once | INTRAVENOUS | Status: AC
  Filled 2020-02-24: qty 250

## 2020-02-24 NOTE — Patient Instructions (Signed)
[facility] Discharge Instructions for Patients Receiving Chemotherapy  Today you received the following chemotherapy agents: gemcitabine.  To help prevent nausea and vomiting after your treatment, we encourage you to take your nausea medication as directed.   If you develop nausea and vomiting that is not controlled by your nausea medication, call the clinic.   BELOW ARE SYMPTOMS THAT SHOULD BE REPORTED IMMEDIATELY:  *FEVER GREATER THAN 100.5 F  *CHILLS WITH OR WITHOUT FEVER  NAUSEA AND VOMITING THAT IS NOT CONTROLLED WITH YOUR NAUSEA MEDICATION  *UNUSUAL SHORTNESS OF BREATH  *UNUSUAL BRUISING OR BLEEDING  TENDERNESS IN MOUTH AND THROAT WITH OR WITHOUT PRESENCE OF ULCERS  *URINARY PROBLEMS  *BOWEL PROBLEMS  UNUSUAL RASH Items with * indicate a potential emergency and should be followed up as soon as possible.  Feel free to call the clinic should you have any questions or concerns. The clinic phone number is [phone].  Please show the [facility] at check-in to the Emergency Department and triage nurse.  Influenza Virus Vaccine injection What is this medicine? INFLUENZA VIRUS VACCINE (in floo EN zuh VAHY ruhs vak SEEN) helps to reduce the risk of getting influenza also known as the flu. The vaccine only helps protect you against some strains of the flu. This medicine may be used for other purposes; ask your health care provider or pharmacist if you have questions. COMMON BRAND NAME(S): Afluria, Afluria Quadrivalent, Agriflu, Alfuria, FLUAD, Fluarix, Fluarix Quadrivalent, Flublok, Flublok Quadrivalent, FLUCELVAX, FLUCELVAX Quadrivalent, Flulaval, Flulaval Quadrivalent, Fluvirin, Fluzone, Fluzone High-Dose, Fluzone Intradermal, Fluzone Quadrivalent What should I tell my health care provider before I take this medicine? They need to know if you have any of these conditions:  bleeding disorder like hemophilia  fever or infection  Guillain-Barre  syndrome or other neurological problems  immune system problems  infection with the human immunodeficiency virus (HIV) or AIDS  low blood platelet counts  multiple sclerosis  an unusual or allergic reaction to influenza virus vaccine, latex, other medicines, foods, dyes, or preservatives. Different brands of vaccines contain different allergens. Some may contain latex or eggs. Talk to your doctor about your allergies to make sure that you get the right vaccine.  pregnant or trying to get pregnant  breast-feeding How should I use this medicine? This vaccine is for injection into a muscle or under the skin. It is given by a health care professional. A copy of Vaccine Information Statements will be given before each vaccination. Read this sheet carefully each time. The sheet may change frequently. Talk to your healthcare provider to see which vaccines are right for you. Some vaccines should not be used in all age groups. Overdosage: If you think you have taken too much of this medicine contact a poison control center or emergency room at once. NOTE: This medicine is only for you. Do not share this medicine with others. What if I miss a dose? This does not apply. What may interact with this medicine?  chemotherapy or radiation therapy  medicines that lower your immune system like etanercept, anakinra, infliximab, and adalimumab  medicines that treat or prevent blood clots like warfarin  phenytoin  steroid medicines like prednisone or cortisone  theophylline  vaccines This list may not describe all possible interactions. Give your health care provider a list of all the medicines, herbs, non-prescription drugs, or dietary supplements you use. Also tell them if you smoke, drink alcohol, or use illegal drugs. Some items may interact with your medicine. What should  I watch for while using this medicine? Report any side effects that do not go away within 3 days to your doctor or health  care professional. Call your health care provider if any unusual symptoms occur within 6 weeks of receiving this vaccine. You may still catch the flu, but the illness is not usually as bad. You cannot get the flu from the vaccine. The vaccine will not protect against colds or other illnesses that may cause fever. The vaccine is needed every year. What side effects may I notice from receiving this medicine? Side effects that you should report to your doctor or health care professional as soon as possible:  allergic reactions like skin rash, itching or hives, swelling of the face, lips, or tongue Side effects that usually do not require medical attention (report to your doctor or health care professional if they continue or are bothersome):  fever  headache  muscle aches and pains  pain, tenderness, redness, or swelling at the injection site  tiredness This list may not describe all possible side effects. Call your doctor for medical advice about side effects. You may report side effects to FDA at 1-800-FDA-1088. Where should I keep my medicine? The vaccine will be given by a health care professional in a clinic, pharmacy, doctor's office, or other health care setting. You will not be given vaccine doses to store at home. NOTE: This sheet is a summary. It may not cover all possible information. If you have questions about this medicine, talk to your doctor, pharmacist, or health care provider.  2020 Elsevier/Gold Standard (2018-04-03 08:45:43)

## 2020-02-24 NOTE — Patient Instructions (Signed)

## 2020-02-25 LAB — CA 125: Cancer Antigen (CA) 125: 4.3 U/mL (ref 0.0–38.1)

## 2020-03-06 ENCOUNTER — Inpatient Hospital Stay: Payer: Managed Care, Other (non HMO)

## 2020-03-06 ENCOUNTER — Other Ambulatory Visit: Payer: Self-pay | Admitting: Hematology and Oncology

## 2020-03-06 ENCOUNTER — Other Ambulatory Visit: Payer: Self-pay

## 2020-03-06 ENCOUNTER — Ambulatory Visit (HOSPITAL_COMMUNITY)
Admission: RE | Admit: 2020-03-06 | Discharge: 2020-03-06 | Disposition: A | Discharge status: Home / Self Care | Payer: Managed Care, Other (non HMO) | Source: Ambulatory Visit | Attending: Hematology and Oncology | Admitting: Hematology and Oncology

## 2020-03-06 DIAGNOSIS — Z7189 Other specified counseling: Secondary | ICD-10-CM

## 2020-03-06 DIAGNOSIS — C562 Malignant neoplasm of left ovary: Secondary | ICD-10-CM

## 2020-03-06 DIAGNOSIS — C786 Secondary malignant neoplasm of retroperitoneum and peritoneum: Secondary | ICD-10-CM

## 2020-03-06 LAB — CBC WITH DIFFERENTIAL (CANCER CENTER ONLY)
Abs Immature Granulocytes: 0.01 K/uL (ref 0.00–0.07)
Basophils Absolute: 0 K/uL (ref 0.0–0.1)
Basophils Relative: 0 %
Eosinophils Absolute: 0.1 K/uL (ref 0.0–0.5)
Eosinophils Relative: 2 %
HCT: 37 % (ref 36.0–46.0)
Hemoglobin: 12.6 g/dL (ref 12.0–15.0)
Immature Granulocytes: 0 %
Lymphocytes Relative: 28 %
Lymphs Abs: 1.1 K/uL (ref 0.7–4.0)
MCH: 33.5 pg (ref 26.0–34.0)
MCHC: 34.1 g/dL (ref 30.0–36.0)
MCV: 98.4 fL (ref 80.0–100.0)
Monocytes Absolute: 0.7 K/uL (ref 0.1–1.0)
Monocytes Relative: 17 %
Neutro Abs: 2.1 K/uL (ref 1.7–7.7)
Neutrophils Relative %: 53 %
Platelet Count: 141 K/uL — ABNORMAL LOW (ref 150–400)
RBC: 3.76 MIL/uL — ABNORMAL LOW (ref 3.87–5.11)
RDW: 13.3 % (ref 11.5–15.5)
WBC Count: 4 K/uL (ref 4.0–10.5)
nRBC: 0 % (ref 0.0–0.2)

## 2020-03-06 LAB — CMP (CANCER CENTER ONLY)
ALT: 91 U/L — ABNORMAL HIGH (ref 0–44)
AST: 67 U/L — ABNORMAL HIGH (ref 15–41)
Albumin: 3.8 g/dL (ref 3.5–5.0)
Alkaline Phosphatase: 112 U/L (ref 38–126)
Anion gap: 5 (ref 5–15)
BUN: 9 mg/dL (ref 8–23)
CO2: 29 mmol/L (ref 22–32)
Calcium: 9.6 mg/dL (ref 8.9–10.3)
Chloride: 108 mmol/L (ref 98–111)
Creatinine: 0.65 mg/dL (ref 0.44–1.00)
GFR, Estimated: >60 mL/min (ref >60)
Glucose, Bld: 99 mg/dL (ref 70–99)
Potassium: 3.7 mmol/L (ref 3.5–5.1)
Sodium: 142 mmol/L (ref 135–145)
Total Bilirubin: 1.3 mg/dL — ABNORMAL HIGH (ref 0.3–1.2)
Total Protein: 6.7 g/dL (ref 6.5–8.1)

## 2020-03-06 MED ORDER — HEPARIN SOD (PORK) LOCK FLUSH 100 UNIT/ML IV SOLN
500.0000 [IU] | Freq: Once | INTRAVENOUS | Status: DC
  Filled 2020-03-06: qty 5

## 2020-03-06 MED ORDER — HEPARIN SOD (PORK) LOCK FLUSH 100 UNIT/ML IV SOLN
INTRAVENOUS | Status: AC
  Filled 2020-03-06: qty 5

## 2020-03-06 MED ORDER — IOHEXOL 300 MG/ML  SOLN
100.0000 mL | Freq: Once | INTRAMUSCULAR | Status: AC | PRN
  Administered 2020-03-06: 100 mL via INTRAVENOUS

## 2020-03-06 MED ORDER — HEPARIN SOD (PORK) LOCK FLUSH 100 UNIT/ML IV SOLN
500.0000 [IU] | Freq: Once | INTRAVENOUS | Status: AC
  Administered 2020-03-06: 500 [IU] via INTRAVENOUS

## 2020-03-06 MED ORDER — SODIUM CHLORIDE 0.9% FLUSH
10.0000 mL | Freq: Once | INTRAVENOUS | Status: DC
  Filled 2020-03-06: qty 10

## 2020-03-09 ENCOUNTER — Inpatient Hospital Stay (HOSPITAL_BASED_OUTPATIENT_CLINIC_OR_DEPARTMENT_OTHER): Payer: Managed Care, Other (non HMO) | Admitting: Hematology and Oncology

## 2020-03-09 ENCOUNTER — Encounter: Payer: Self-pay | Admitting: Hematology and Oncology

## 2020-03-09 ENCOUNTER — Inpatient Hospital Stay: Payer: Managed Care, Other (non HMO)

## 2020-03-09 ENCOUNTER — Other Ambulatory Visit: Payer: Self-pay

## 2020-03-09 ENCOUNTER — Other Ambulatory Visit: Payer: Self-pay | Admitting: Hematology and Oncology

## 2020-03-09 DIAGNOSIS — T451X5A Adverse effect of antineoplastic and immunosuppressive drugs, initial encounter: Secondary | ICD-10-CM | POA: Insufficient documentation

## 2020-03-09 DIAGNOSIS — R748 Abnormal levels of other serum enzymes: Secondary | ICD-10-CM | POA: Diagnosis not present

## 2020-03-09 DIAGNOSIS — R110 Nausea: Secondary | ICD-10-CM | POA: Diagnosis not present

## 2020-03-09 DIAGNOSIS — Z7189 Other specified counseling: Secondary | ICD-10-CM

## 2020-03-09 DIAGNOSIS — Z5111 Encounter for antineoplastic chemotherapy: Secondary | ICD-10-CM | POA: Diagnosis not present

## 2020-03-09 DIAGNOSIS — C562 Malignant neoplasm of left ovary: Secondary | ICD-10-CM

## 2020-03-09 MED ORDER — SODIUM CHLORIDE 0.9 % IV SOLN
Freq: Once | INTRAVENOUS | Status: AC
  Filled 2020-03-09: qty 250

## 2020-03-09 MED ORDER — HEPARIN SOD (PORK) LOCK FLUSH 100 UNIT/ML IV SOLN
500.0000 [IU] | Freq: Once | INTRAVENOUS | Status: AC | PRN
  Administered 2020-03-09: 500 [IU]
  Filled 2020-03-09: qty 5

## 2020-03-09 MED ORDER — SODIUM CHLORIDE 0.9 % IV SOLN
480.0000 mg/m2 | Freq: Once | INTRAVENOUS | Status: AC
  Administered 2020-03-09: 798 mg via INTRAVENOUS
  Filled 2020-03-09: qty 20.99

## 2020-03-09 MED ORDER — ONDANSETRON HCL 8 MG PO TABS
8.0000 mg | ORAL_TABLET | Freq: Once | ORAL | Status: AC
  Administered 2020-03-09: 8 mg via ORAL

## 2020-03-09 MED ORDER — SODIUM CHLORIDE 0.9% FLUSH
10.0000 mL | INTRAVENOUS | Status: DC | PRN
  Administered 2020-03-09: 10 mL
  Filled 2020-03-09: qty 10

## 2020-03-09 MED ORDER — ONDANSETRON HCL 8 MG PO TABS
ORAL_TABLET | ORAL | Status: AC
  Filled 2020-03-09: qty 1

## 2020-03-09 NOTE — Progress Notes (Signed)
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[facility]) I have reviewed her recent blood work and imaging studies She has achieved complete response to therapy We discussed the role of maintenance gemcitabine every other week and she is in agreement to proceed I plan to repeat imaging study in a few months early next year, probably around February 2022  Elevated liver enzymes She has intermittent elevated liver enzymes, 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 She is not symptomatic Observe for now  Chemotherapy-induced nausea She has what sounds like anticipatory nausea Plan to switch out Compazine and use Zofran instead because of what sounds like excessive sedation after each dose of treatment despite no significant signs of anemia She is in agreement  Goals of care, counseling/discussion We have extensive discussions about goals of care We discussed the role of treatment in the form of maintenance chemotherapy to reduce risk of relapse For now, she tolerated gemcitabine fairly well I see no reason to switch her to something else   No orders of the defined types were placed in this encounter.   All questions were answered. The patient knows to call the clinic with any problems, questions or concerns. The total time spent in the appointment was 25 minutes encounter with patients including review of chart and various tests results, discussions about plan of care and coordination of care plan   [name] Lark, MD 03/09/2020 9:43 AM  INTERVAL HISTORY: Please see below for problem oriented charting. 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 14.7 cm poorly defined soft tissue mass in central pelvis suspicious for primary ovarian carcinoma, with uterine leiomyosarcoma considered a less likely differential diagnosis. 2. Moderate ascites and diffuse peritoneal carcinomatosis. 3. Several small uterine fibroids.   11/13/2018 Tumor Marker   Patient's tumor was tested for the following markers: CA-125 Results of the tumor marker test revealed 1434   11/21/2018 Procedure   Successful ultrasound-guided diagnostic and therapeutic paracentesis yielding 3.1 liters of peritoneal fluid   11/21/2018 Pathology Results   PERITONEAL/ASCITIC FLUID(SPECIMEN 1 OF 1 COLLECTED 11/21/18): ADENOCARCINOMA. Specimen Clinical Information Pelvic mass suspicious for ovarian cancer Source Peritoneal/Ascitic Fluid, (specimen 1 of 1 collected 11/21/18) Gross Specimen: Received is/are 1000 ccs of dark amber fluid. (CM:cm) Prepared: # Smears: 0 # Concentration Technique Slides (i.e. 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   11/29/2018 Initial Diagnosis   Ovarian cancer ([place])   11/29/2018 Cancer Staging   Staging form: Ovary, Fallopian Tube, and Primary Peritoneal Carcinoma, AJCC 8th Edition - Clinical: cT3, cN0, cM0 - Signed by [name] Lark, MD on 11/29/2018   12/03/2018 Procedure   Successful ultrasound-guided therapeutic paracentesis yielding 3.7 liters of peritoneal fluid.     12/06/2018 Procedure   Placement of a subcutaneous port device. Catheter tip at the SVC and right atrium junction   12/14/2018 Procedure   Successful ultrasound-guided paracentesis yielding 2.9 L of peritoneal fluid  12/28/2018 Tumor Marker   Patient's  tumor was tested for the following markers: CA-125 Results of the tumor marker test revealed 812.   12/28/2018 - 04/22/2019 Chemotherapy   The patient had carboplatin and taxol for neoadjuvant treatment, followed by interval debulking surgery and subsequent adjuvant chemotherapy treatment.     02/01/2019 Imaging   Ct abdomen and pelvis 8.6 cm left ovarian mass, corresponding to the patient's known primary neoplasm, improved.   Mild peritoneal nodularity/omental caking, improved.   Small abdominopelvic ascites, improved.     02/01/2019 Tumor Marker   Patient's tumor was tested for the following markers: CA-125 Results of the tumor marker test revealed 183.   02/12/2019 Surgery   Preoperative Diagnosis: Stage IIIC ovarian cancer, s/p neoadjuvant chemotherapy      Procedure(s) Performed: 1. Exploratory laparotomy with total abdominal hysterectomy, bilateral salpingo-oophorectomy, omentectomy radical tumor debulking for ovarian cancer.   Surgeon: Thereasa Solo, MD.    Operative Findings: upper abdomen free of disease. No visible omental disease. Small volume (200cc) ascites. 8cm friable mass replacing left ovary and adherent to the sigmoid colon mesentery and ureter on the left.  Anterior fibroid. This represented an optimal cytoreduction (R0) with no gross visible disease remaining.    02/12/2019 Pathology Results   A. OVARY AND FALLOPIAN TUBE, LEFT, SALPINGO-OOPHORECTOMY: - Serous carcinoma, high grade, status post neoadjuvant therapy - See oncology table and comment below B. UTERUS CERVIX WITH RIGHT FALLOPIAN TUBE AND OVARY, HYSTERECTOMY: Uterus: - Serosal surface involved by serous carcinoma - Endomyometrium uninvolved by carcinoma - Benign endometrial polyp (4.1 cm) - Leiomyomata (5.5 cm; largest) - Adenomyosis Cervix: - Uninvolved by carcinoma Left ovary: - Serous carcinoma, high grade Left fallopian tube: - Serous carcinoma, high grade C. SOFT TISSUE, LEFT PELVIC SIDEWALL  TUMOR, EXCISION: - Metastatic serous carcinoma, high-grade D. SOFT TISSUE, SIGMOID COLON MESENTERY, EXCISION: - Metastatic serous carcinoma, high-grade E. OMENTUM, TUMOR RESECTION: - Metastatic serous carcinoma, high-grade OVARY or FALLOPIAN TUBE or PRIMARY PERITONEUM: Procedure: Total hysterectomy and bilateral salpingo-oophorectomy, omentectomy and peritoneal biopsies Specimen Integrity: Fragmented Tumor Site: Left ovary and fallopian tube Ovarian Surface Involvement: Present Fallopian Tube Surface Involvement: Present Tumor Size: 6.3 cm (see comment) Histologic Type: Serous carcinoma Histologic Grade: High-grade Implants: Not applicable Other Tissue/ Organ Involvement: Right ovary, right fallopian tube, omentum, mesentery Largest Extrapelvic Peritoneal Focus: Macroscopic Peritoneal/Ascitic Fluid: Malignant Treatment Effect: No definite or minimal response identified (chemotherapy response score 1 [CRS1] Regional Lymph Nodes: No lymph nodes submitted or found Pathologic Stage Classification (pTNM, AJCC 8th Edition): pT3c, pNX Representative Tumor Block: A4 Comment(s): Additional testing (HER-2, MMR and MSI) are pending. The primary tumor site appears to be the left ovary and fallopian tube. The uterus is only involved on the serosal surface. There is tumor on the anterior peritoneal reflection.  Addendum: Tumor is Her2 negative,MSI stable   02/19/2019 Tumor Marker   Patient's tumor was tested for the following markers: CA-125 Results of the tumor marker test revealed 40.3   04/01/2019 Tumor Marker   Patient's tumor was tested for the following markers: CA-125 Results of the tumor marker test revealed 8.1    Genetic Testing   Negative testing. No pathogenic variants identified on the [employer]. The report date is 04/17/2019.  Somatic genes analyzed through [facility]: ATM, BARD1, BRCA1, BRCA2, BRIP1, CHEK2, MRE11A, NBN, PALB2, RAD51C, RAD51D.  The  [facility] gene panel offered by [facility] includes sequencing and rearrangement analysis for the following 36 genes: APC*, ATM*, AXIN2, BARD1, BMPR1A, BRCA1*, BRCA2*, BRIP1*,  CDH1*, CDK4, CDKN2A, CHEK2*, DICER1, MLH1*, MSH2*, MSH3, MSH6*, MUTYH*, NBN, NF1*, NTHL1, PALB2*, PMS2*, PTEN*, RAD51C*, RAD51D*, RECQL, SMAD4, SMARCA4, STK11 and TP53* (sequencing and deletion/duplication); HOXB13, POLD1 and POLE (sequencing only); EPCAM and GREM1 (deletion/duplication only).    05/21/2019 Imaging   1. Interval hysterectomy, oophorectomy and omentectomy. No evidence of residual ovarian carcinoma in the pelvis. No evidence of peritoneal disease. No intraperitoneal free fluid. 2. Nonobstructing LEFT renal calculi.  Normal ureters.   05/22/2019 Tumor Marker   Patient's tumor was tested for the following markers: CA-125 Results of the tumor marker test revealed 5.7.   06/03/2019 Procedure   Successful right IJ vein Port-A-Cath explant.   08/26/2019 Tumor Marker   Patient's tumor was tested for the following markers: CA-125. Results of the tumor marker test revealed 4.9   10/03/2019 Imaging   1. New tumor deposits along the capsular surfaces of the liver, spleen, in the left pelvis, and right paracolic gutter, compatible with metastatic disease. 2. Mild dilation of the dorsal pancreatic duct in the pancreatic body and head, cause uncertain. 3. Nonobstructive left nephrolithiasis. 4. Aortic atherosclerosis.   Aortic Atherosclerosis (ICD10-I70.0   10/10/2019 Procedure   Successful placement of a right IJ approach Power Port with ultrasound and fluoroscopic guidance. The catheter is ready for use.     10/11/2019 Tumor Marker   Patient's tumor was tested for the following markers: CA-125 Results of the tumor marker test revealed 19.6.   10/14/2019 -  Chemotherapy   The patient had carboplatin and gemzar for chemotherapy treatment.     11/04/2019 Tumor Marker   Patient's tumor was tested for the  following markers: CA-125 Results of the tumor marker test revealed 11.4   11/28/2019 Tumor Marker   Patient's tumor was tested for the following markers: CA-125 Results of the tumor marker test revealed 5.8   12/20/2019 Imaging   1. Significant interval reduction in mixed solid and cystic nodule in the vicinity of the left ovary, with significant improvement or resolution of multiple pelvic, peritoneal, and organ capsule implants. Resolution of previously seen small volume ascites. Findings are consistent with treatment response of abdominal metastatic disease. 2. Status post hysterectomy, oophorectomy, and omentectomy. 3. Nonobstructive left nephrolithiasis. 4. Aortic Atherosclerosis (ICD10-I70.0).   12/23/2019 Tumor Marker   Patient's tumor was tested for the following markers: CA-125 Results of the tumor marker test revealed 5.0   01/24/2020 Tumor Marker   Patient's tumor was tested for the following markers: CA-125 Results of the tumor marker test revealed 4.8   02/24/2020 Tumor Marker   Patient's tumor was tested for the following markers: CA-125. Results of the tumor marker test revealed 4.3.   03/06/2020 Imaging   1. Status post hysterectomy, bilateral oophorectomy, and omentectomy. 2. Resolution of previously described left adnexal nodularity. 3. No residual disease identified. 4.  Aortic Atherosclerosis (ICD10-I70.0). 5. Left nephrolithiasis.     REVIEW OF SYSTEMS:   Constitutional: Denies fevers, chills or abnormal weight loss Eyes: Denies blurriness of vision Ears, nose, mouth, throat, and face: Denies mucositis or sore throat Respiratory: Denies cough, dyspnea or wheezes Cardiovascular: Denies palpitation, chest discomfort or lower extremity swelling Skin: Denies abnormal skin rashes Lymphatics: Denies new lymphadenopathy or easy bruising Neurological:Denies numbness, tingling or new weaknesses Behavioral/Psych: Mood is stable, no new changes  All other systems were  reviewed with the patient and are negative.  I have reviewed the past medical history, past surgical history, social history and family history with the patient and they are unchanged from  previous note.  ALLERGIES:  is allergic to skin adhesives [cyanoacrylate], shrimp [shellfish allergy], and sulfonamide derivatives.  MEDICATIONS:  Current Outpatient Medications  Medication Sig Dispense Refill  . ALPRAZolam (XANAX) 0.25 MG tablet Take 0.25 mg by mouth at bedtime as needed for anxiety.    . carboxymethylcellulose (REFRESH PLUS) 0.5 % SOLN Place 1 drop into both eyes 2 (two) times daily as needed (dry eyes).    . Cholecalciferol (VITAMIN D3) 25 MCG (1000 UT) CAPS Take 1,000 Units by mouth daily.     [name] Kitchen esomeprazole (NEXIUM) 20 MG capsule Take 20 mg by mouth daily.     [name] Kitchen estradiol (ESTRACE VAGINAL) 0.1 MG/GM vaginal cream Place 1 Applicatorful vaginally 3 (three) times a week. 42.5 g 12  . lidocaine-prilocaine (EMLA) cream Apply 1 application topically as needed. 30 g 11  . loratadine (CLARITIN) 10 MG tablet Take 10 mg by mouth daily as needed for allergies.    . Multiple Vitamin (MULTIVITAMIN WITH MINERALS) TABS tablet Take 1 tablet by mouth daily.    . ondansetron (ZOFRAN) 8 MG tablet Take 8 mg by mouth every 8 (eight) hours as needed.    . prochlorperazine (COMPAZINE) 10 MG tablet Take 10 mg by mouth every 6 (six) hours as needed.     No current facility-administered medications for this visit.    PHYSICAL EXAMINATION: ECOG PERFORMANCE STATUS: 1 - Symptomatic but completely ambulatory  Vitals:   03/09/20 0903  BP: (!) 150/83  Pulse: 79  Resp: 18  Temp: 97.6 F (36.4 C)  SpO2: 100%   Filed Weights   03/09/20 0903  Weight: 148 lb 9.6 oz (67.4 kg)    GENERAL:alert, no distress and comfortable NEURO: alert & oriented x 3 with fluent speech, no focal motor/sensory deficits  LABORATORY DATA:  I have reviewed the data as listed    Component Value Date/Time   NA 142 03/06/2020  0818   K 3.7 03/06/2020 0818   CL 108 03/06/2020 0818   CO2 29 03/06/2020 0818   GLUCOSE 99 03/06/2020 0818   BUN 9 03/06/2020 0818   CREATININE 0.65 03/06/2020 0818   CALCIUM 9.6 03/06/2020 0818   PROT 6.7 03/06/2020 0818   ALBUMIN 3.8 03/06/2020 0818   AST 67 (H) 03/06/2020 0818   ALT 91 (H) 03/06/2020 0818   ALKPHOS 112 03/06/2020 0818   BILITOT 1.3 (H) 03/06/2020 0818   GFRNONAA >60 03/06/2020 0818   GFRAA >60 02/24/2020 0852    No results found for: SPEP, UPEP  Lab Results  Component Value Date   WBC 4.0 03/06/2020   NEUTROABS 2.1 03/06/2020   HGB 12.6 03/06/2020   HCT 37.0 03/06/2020   MCV 98.4 03/06/2020   PLT 141 (L) 03/06/2020      Chemistry      Component Value Date/Time   NA 142 03/06/2020 0818   K 3.7 03/06/2020 0818   CL 108 03/06/2020 0818   CO2 29 03/06/2020 0818   BUN 9 03/06/2020 0818   CREATININE 0.65 03/06/2020 0818      Component Value Date/Time   CALCIUM 9.6 03/06/2020 0818   ALKPHOS 112 03/06/2020 0818   AST 67 (H) 03/06/2020 0818   ALT 91 (H) 03/06/2020 0818   BILITOT 1.3 (H) 03/06/2020 0818       RADIOGRAPHIC STUDIES: I have reviewed multiple imaging studies with the patient I have personally reviewed the radiological images as listed and agreed with the findings in the report. CT ABDOMEN PELVIS W CONTRAST  Result Date:  03/06/2020 CLINICAL DATA:  Ovarian cancer diagnosed 6/20 with chemotherapy ongoing. Hysterectomy. Asymptomatic. EXAM: CT ABDOMEN AND PELVIS WITH CONTRAST TECHNIQUE: Multidetector CT imaging of the abdomen and pelvis was performed using the standard protocol following bolus administration of intravenous contrast. CONTRAST:  159mL OMNIPAQUE IOHEXOL 300 MG/ML  SOLN COMPARISON:  12/20/2019 FINDINGS: Lower chest: Clear lung bases. Normal heart size without pericardial or pleural effusion. Incompletely imaged right Port-A-Cath with tip at mid right atrium. Hepatobiliary: Normal liver. Normal gallbladder, without biliary ductal  dilatation. Pancreas: Normal, without mass or ductal dilatation. Spleen: Normal in size, without focal abnormality. Adrenals/Urinary Tract: Normal adrenal glands. Left renal collecting system calculi of up to 5 mm. Normal right kidney. No hydronephrosis. Normal urinary bladder. Stomach/Bowel: Normal stomach, without wall thickening. Scattered colonic diverticula. Normal terminal ileum and appendix. Normal small bowel. Vascular/Lymphatic: Aortic atherosclerosis. No abdominopelvic adenopathy. Reproductive: Hysterectomy and bilateral oophorectomy. The left pelvic/adnexal nodularity has resolved. Other: No significant free fluid. Prior omentectomy. No peritoneal nodularity identified. Musculoskeletal: Right acetabular bone island. IMPRESSION: 1. Status post hysterectomy, bilateral oophorectomy, and omentectomy. 2. Resolution of previously described left adnexal nodularity. 3. No residual disease identified. 4.  Aortic Atherosclerosis (ICD10-I70.0). 5. Left nephrolithiasis. Electronically Signed   By: Abigail Miyamoto M.D.   On: 03/06/2020 10:29

## 2020-03-09 NOTE — Patient Instructions (Signed)
[facility] Discharge Instructions for Patients Receiving Chemotherapy  Today you received the following chemotherapy agents: gemcitabine.  To help prevent nausea and vomiting after your treatment, we encourage you to take your nausea medication as directed.   If you develop nausea and vomiting that is not controlled by your nausea medication, call the clinic.   BELOW ARE SYMPTOMS THAT SHOULD BE REPORTED IMMEDIATELY:  *FEVER GREATER THAN 100.5 F  *CHILLS WITH OR WITHOUT FEVER  NAUSEA AND VOMITING THAT IS NOT CONTROLLED WITH YOUR NAUSEA MEDICATION  *UNUSUAL SHORTNESS OF BREATH  *UNUSUAL BRUISING OR BLEEDING  TENDERNESS IN MOUTH AND THROAT WITH OR WITHOUT PRESENCE OF ULCERS  *URINARY PROBLEMS  *BOWEL PROBLEMS  UNUSUAL RASH Items with * indicate a potential emergency and should be followed up as soon as possible.  Feel free to call the clinic should you have any questions or concerns. The clinic phone number is [phone].  Please show the CHEMO ALERT CARD at check-in to the Emergency Department and triage nurse.   

## 2020-03-09 NOTE — Assessment & Plan Note (Signed)
I have reviewed her recent blood work and imaging studies She has achieved complete response to therapy We discussed the role of maintenance gemcitabine every other week and she is in agreement to proceed I plan to repeat imaging study in a few months early next year, probably around February 2022

## 2020-03-09 NOTE — Assessment & Plan Note (Signed)
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

## 2020-03-09 NOTE — Assessment & Plan Note (Signed)
We have extensive discussions about goals of care We discussed the role of treatment in the form of maintenance chemotherapy to reduce risk of relapse For now, she tolerated gemcitabine fairly well I see no reason to switch her to something else

## 2020-03-09 NOTE — Assessment & Plan Note (Signed)
She has what sounds like anticipatory nausea Plan to switch out Compazine and use Zofran instead because of what sounds like excessive sedation after each dose of treatment despite no significant signs of anemia She is in agreement

## 2020-03-23 ENCOUNTER — Other Ambulatory Visit: Payer: Self-pay

## 2020-03-23 ENCOUNTER — Inpatient Hospital Stay: Payer: Managed Care, Other (non HMO)

## 2020-03-23 ENCOUNTER — Inpatient Hospital Stay: Payer: Managed Care, Other (non HMO) | Attending: Gynecologic Oncology

## 2020-03-23 VITALS — BP 154/95 | HR 73 | Temp 98.1°F | Resp 18 | Ht 62.0 in | Wt 150.0 lb

## 2020-03-23 DIAGNOSIS — C562 Malignant neoplasm of left ovary: Secondary | ICD-10-CM

## 2020-03-23 DIAGNOSIS — Z5111 Encounter for antineoplastic chemotherapy: Secondary | ICD-10-CM | POA: Insufficient documentation

## 2020-03-23 DIAGNOSIS — Z7189 Other specified counseling: Secondary | ICD-10-CM

## 2020-03-23 DIAGNOSIS — R110 Nausea: Secondary | ICD-10-CM | POA: Insufficient documentation

## 2020-03-23 DIAGNOSIS — C786 Secondary malignant neoplasm of retroperitoneum and peritoneum: Secondary | ICD-10-CM | POA: Insufficient documentation

## 2020-03-23 DIAGNOSIS — T451X5A Adverse effect of antineoplastic and immunosuppressive drugs, initial encounter: Secondary | ICD-10-CM | POA: Diagnosis not present

## 2020-03-23 DIAGNOSIS — Z79899 Other long term (current) drug therapy: Secondary | ICD-10-CM | POA: Insufficient documentation

## 2020-03-23 LAB — CBC WITH DIFFERENTIAL (CANCER CENTER ONLY)
Abs Immature Granulocytes: 0.01 10*3/uL (ref 0.00–0.07)
Basophils Absolute: 0 10*3/uL (ref 0.0–0.1)
Basophils Relative: 0 %
Eosinophils Absolute: 0.1 10*3/uL (ref 0.0–0.5)
Eosinophils Relative: 2 %
HCT: 36.3 % (ref 36.0–46.0)
Hemoglobin: 12.5 g/dL (ref 12.0–15.0)
Immature Granulocytes: 0 %
Lymphocytes Relative: 24 %
Lymphs Abs: 1.1 10*3/uL (ref 0.7–4.0)
MCH: 33.9 pg (ref 26.0–34.0)
MCHC: 34.4 g/dL (ref 30.0–36.0)
MCV: 98.4 fL (ref 80.0–100.0)
Monocytes Absolute: 0.6 10*3/uL (ref 0.1–1.0)
Monocytes Relative: 13 %
Neutro Abs: 2.7 10*3/uL (ref 1.7–7.7)
Neutrophils Relative %: 61 %
Platelet Count: 161 10*3/uL (ref 150–400)
RBC: 3.69 MIL/uL — ABNORMAL LOW (ref 3.87–5.11)
RDW: 13.4 % (ref 11.5–15.5)
WBC Count: 4.5 10*3/uL (ref 4.0–10.5)
nRBC: 0 % (ref 0.0–0.2)

## 2020-03-23 LAB — CMP (CANCER CENTER ONLY)
ALT: 53 U/L — ABNORMAL HIGH (ref 0–44)
AST: 46 U/L — ABNORMAL HIGH (ref 15–41)
Albumin: 3.7 g/dL (ref 3.5–5.0)
Alkaline Phosphatase: 85 U/L (ref 38–126)
Anion gap: 7 (ref 5–15)
BUN: 12 mg/dL (ref 8–23)
CO2: 26 mmol/L (ref 22–32)
Calcium: 9.1 mg/dL (ref 8.9–10.3)
Chloride: 109 mmol/L (ref 98–111)
Creatinine: 0.67 mg/dL (ref 0.44–1.00)
GFR, Estimated: >60 mL/min (ref >60)
Glucose, Bld: 126 mg/dL — ABNORMAL HIGH (ref 70–99)
Potassium: 3.5 mmol/L (ref 3.5–5.1)
Sodium: 142 mmol/L (ref 135–145)
Total Bilirubin: 1.4 mg/dL — ABNORMAL HIGH (ref 0.3–1.2)
Total Protein: 6.1 g/dL — ABNORMAL LOW (ref 6.5–8.1)

## 2020-03-23 MED ORDER — SODIUM CHLORIDE 0.9% FLUSH
10.0000 mL | INTRAVENOUS | Status: DC | PRN
  Administered 2020-03-23: 10 mL
  Filled 2020-03-23: qty 10

## 2020-03-23 MED ORDER — PROCHLORPERAZINE MALEATE 10 MG PO TABS: 10.0000 mg | ORAL_TABLET | Freq: Once | ORAL | Status: DC

## 2020-03-23 MED ORDER — ONDANSETRON HCL 8 MG PO TABS
ORAL_TABLET | ORAL | Status: AC
  Filled 2020-03-23: qty 1

## 2020-03-23 MED ORDER — HEPARIN SOD (PORK) LOCK FLUSH 100 UNIT/ML IV SOLN
500.0000 [IU] | Freq: Once | INTRAVENOUS | Status: AC | PRN
  Administered 2020-03-23: 500 [IU]
  Filled 2020-03-23: qty 5

## 2020-03-23 MED ORDER — SODIUM CHLORIDE 0.9 % IV SOLN
Freq: Once | INTRAVENOUS | Status: AC
  Filled 2020-03-23: qty 250

## 2020-03-23 MED ORDER — SODIUM CHLORIDE 0.9 % IV SOLN
480.0000 mg/m2 | Freq: Once | INTRAVENOUS | Status: AC
  Administered 2020-03-23: 798 mg via INTRAVENOUS
  Filled 2020-03-23: qty 20.99

## 2020-03-23 MED ORDER — ONDANSETRON HCL 8 MG PO TABS
8.0000 mg | ORAL_TABLET | Freq: Once | ORAL | Status: AC
  Administered 2020-03-23: 8 mg via ORAL

## 2020-03-23 NOTE — Patient Instructions (Signed)
[facility] °Discharge Instructions for Patients Receiving Chemotherapy ° °Today you received the following chemotherapy agents Gemzar ° °To help prevent nausea and vomiting after your treatment, we encourage you to take your nausea medication as directed. °  °If you develop nausea and vomiting that is not controlled by your nausea medication, call the clinic.  ° °BELOW ARE SYMPTOMS THAT SHOULD BE REPORTED IMMEDIATELY: °· *FEVER GREATER THAN 100.5 F °· *CHILLS WITH OR WITHOUT FEVER °· NAUSEA AND VOMITING THAT IS NOT CONTROLLED WITH YOUR NAUSEA MEDICATION °· *UNUSUAL SHORTNESS OF BREATH °· *UNUSUAL BRUISING OR BLEEDING °· TENDERNESS IN MOUTH AND THROAT WITH OR WITHOUT PRESENCE OF ULCERS °· *URINARY PROBLEMS °· *BOWEL PROBLEMS °· UNUSUAL RASH °Items with * indicate a potential emergency and should be followed up as soon as possible. ° °Feel free to call the clinic should you have any questions or concerns. The clinic phone number is [phone]. ° °Please show the CHEMO ALERT CARD at check-in to the Emergency Department and triage nurse. ° ° °

## 2020-04-06 ENCOUNTER — Inpatient Hospital Stay: Payer: Managed Care, Other (non HMO)

## 2020-04-06 ENCOUNTER — Inpatient Hospital Stay (HOSPITAL_BASED_OUTPATIENT_CLINIC_OR_DEPARTMENT_OTHER): Payer: Managed Care, Other (non HMO) | Admitting: Hematology and Oncology

## 2020-04-06 ENCOUNTER — Encounter: Payer: Self-pay | Admitting: Hematology and Oncology

## 2020-04-06 ENCOUNTER — Other Ambulatory Visit: Payer: Self-pay

## 2020-04-06 ENCOUNTER — Other Ambulatory Visit: Payer: Self-pay | Admitting: Hematology and Oncology

## 2020-04-06 DIAGNOSIS — R748 Abnormal levels of other serum enzymes: Secondary | ICD-10-CM | POA: Diagnosis not present

## 2020-04-06 DIAGNOSIS — T451X5A Adverse effect of antineoplastic and immunosuppressive drugs, initial encounter: Secondary | ICD-10-CM

## 2020-04-06 DIAGNOSIS — Z7189 Other specified counseling: Secondary | ICD-10-CM

## 2020-04-06 DIAGNOSIS — C562 Malignant neoplasm of left ovary: Secondary | ICD-10-CM

## 2020-04-06 DIAGNOSIS — Z5111 Encounter for antineoplastic chemotherapy: Secondary | ICD-10-CM | POA: Diagnosis not present

## 2020-04-06 DIAGNOSIS — R110 Nausea: Secondary | ICD-10-CM | POA: Diagnosis not present

## 2020-04-06 DIAGNOSIS — C786 Secondary malignant neoplasm of retroperitoneum and peritoneum: Secondary | ICD-10-CM

## 2020-04-06 LAB — CBC WITH DIFFERENTIAL (CANCER CENTER ONLY)
Abs Immature Granulocytes: 0.01 10*3/uL (ref 0.00–0.07)
Basophils Absolute: 0 10*3/uL (ref 0.0–0.1)
Basophils Relative: 0 %
Eosinophils Absolute: 0.1 10*3/uL (ref 0.0–0.5)
Eosinophils Relative: 1 %
HCT: 37.3 % (ref 36.0–46.0)
Hemoglobin: 12.7 g/dL (ref 12.0–15.0)
Immature Granulocytes: 0 %
Lymphocytes Relative: 27 %
Lymphs Abs: 1.2 10*3/uL (ref 0.7–4.0)
MCH: 33.5 pg (ref 26.0–34.0)
MCHC: 34 g/dL (ref 30.0–36.0)
MCV: 98.4 fL (ref 80.0–100.0)
Monocytes Absolute: 0.6 10*3/uL (ref 0.1–1.0)
Monocytes Relative: 14 %
Neutro Abs: 2.6 10*3/uL (ref 1.7–7.7)
Neutrophils Relative %: 58 %
Platelet Count: 196 10*3/uL (ref 150–400)
RBC: 3.79 MIL/uL — ABNORMAL LOW (ref 3.87–5.11)
RDW: 13.6 % (ref 11.5–15.5)
WBC Count: 4.6 10*3/uL (ref 4.0–10.5)
nRBC: 0 % (ref 0.0–0.2)

## 2020-04-06 LAB — CMP (CANCER CENTER ONLY)
ALT: 89 U/L — ABNORMAL HIGH (ref 0–44)
AST: 53 U/L — ABNORMAL HIGH (ref 15–41)
Albumin: 3.8 g/dL (ref 3.5–5.0)
Alkaline Phosphatase: 89 U/L (ref 38–126)
Anion gap: 9 (ref 5–15)
BUN: 11 mg/dL (ref 8–23)
CO2: 25 mmol/L (ref 22–32)
Calcium: 8.9 mg/dL (ref 8.9–10.3)
Chloride: 109 mmol/L (ref 98–111)
Creatinine: 0.63 mg/dL (ref 0.44–1.00)
GFR, Estimated: >60 mL/min (ref >60)
Glucose, Bld: 89 mg/dL (ref 70–99)
Potassium: 3.6 mmol/L (ref 3.5–5.1)
Sodium: 143 mmol/L (ref 135–145)
Total Bilirubin: 1.7 mg/dL — ABNORMAL HIGH (ref 0.3–1.2)
Total Protein: 6.4 g/dL — ABNORMAL LOW (ref 6.5–8.1)

## 2020-04-06 MED ORDER — SODIUM CHLORIDE 0.9 % IV SOLN
Freq: Once | INTRAVENOUS | Status: AC
  Filled 2020-04-06: qty 250

## 2020-04-06 MED ORDER — SODIUM CHLORIDE 0.9% FLUSH
10.0000 mL | INTRAVENOUS | Status: DC | PRN
  Administered 2020-04-06: 10 mL
  Filled 2020-04-06: qty 10

## 2020-04-06 MED ORDER — PROCHLORPERAZINE MALEATE 10 MG PO TABS: 10.0000 mg | ORAL_TABLET | Freq: Four times a day (QID) | ORAL | 2 refills | Status: DC | PRN

## 2020-04-06 MED ORDER — SODIUM CHLORIDE 0.9 % IV SOLN
480.0000 mg/m2 | Freq: Once | INTRAVENOUS | Status: AC
  Administered 2020-04-06: 798 mg via INTRAVENOUS
  Filled 2020-04-06: qty 20.99

## 2020-04-06 MED ORDER — ONDANSETRON HCL 8 MG PO TABS
ORAL_TABLET | ORAL | Status: AC
  Filled 2020-04-06: qty 1

## 2020-04-06 MED ORDER — SODIUM CHLORIDE 0.9% FLUSH
10.0000 mL | Freq: Once | INTRAVENOUS | Status: AC
  Administered 2020-04-06: 10 mL
  Filled 2020-04-06: qty 10

## 2020-04-06 MED ORDER — ONDANSETRON HCL 8 MG PO TABS
8.0000 mg | ORAL_TABLET | Freq: Once | ORAL | Status: AC
  Administered 2020-04-06: 8 mg via ORAL

## 2020-04-06 MED ORDER — ONDANSETRON HCL 8 MG PO TABS
8.0000 mg | ORAL_TABLET | Freq: Three times a day (TID) | ORAL | 1 refills | Status: DC | PRN
Start: 2020-04-06 — End: 2020-04-06

## 2020-04-06 MED ORDER — HEPARIN SOD (PORK) LOCK FLUSH 100 UNIT/ML IV SOLN
500.0000 [IU] | Freq: Once | INTRAVENOUS | Status: AC | PRN
  Administered 2020-04-06: 500 [IU]
  Filled 2020-04-06: qty 5

## 2020-04-06 MED FILL — PROCHLORPERAZINE 10 MG TAB: 10 | 7 days supply | Qty: 30 | Fill #0

## 2020-04-06 MED FILL — ONDANSETRON HCL 8 MG TABLET: 8 | 10 days supply | Qty: 30 | Fill #0

## 2020-04-06 NOTE — Assessment & Plan Note (Signed)
She has what sounds like anticipatory nausea, and changing anti-emetics to zofran was helpful I refill her prescription antiemetics today

## 2020-04-06 NOTE — Assessment & Plan Note (Signed)
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

## 2020-04-06 NOTE — Patient Instructions (Signed)
[place] Discharge Instructions for Patients Receiving Chemotherapy  Today you received the following chemotherapy agent: Gemcitabine  To help prevent nausea and vomiting after your treatment, we encourage you to take your nausea medication as directed by your MD.   If you develop nausea and vomiting that is not controlled by your nausea medication, call the clinic.   BELOW ARE SYMPTOMS THAT SHOULD BE REPORTED IMMEDIATELY:  *FEVER GREATER THAN 100.5 F  *CHILLS WITH OR WITHOUT FEVER  NAUSEA AND VOMITING THAT IS NOT CONTROLLED WITH YOUR NAUSEA MEDICATION  *UNUSUAL SHORTNESS OF BREATH  *UNUSUAL BRUISING OR BLEEDING  TENDERNESS IN MOUTH AND THROAT WITH OR WITHOUT PRESENCE OF ULCERS  *URINARY PROBLEMS  *BOWEL PROBLEMS  UNUSUAL RASH Items with * indicate a potential emergency and should be followed up as soon as possible.  Feel free to call the clinic should you have any questions or concerns. The clinic phone number is [phone].  Please show the [place] at check-in to the Emergency Department and triage nurse.

## 2020-04-06 NOTE — Assessment & Plan Note (Signed)
I have reviewed her recent blood work and imaging studies She has achieved complete response to therapy We discussed the role of maintenance gemcitabine every other week and she is in agreement to proceed I plan to repeat imaging study in a few months early next year, probably around February 2022

## 2020-04-06 NOTE — Progress Notes (Signed)
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[facility]) I have reviewed her recent blood work and imaging studies She has achieved complete response to therapy We discussed the role of maintenance gemcitabine every other week and she is in agreement to proceed I plan to repeat imaging study in a few months early next year, probably around February 2022  Elevated liver enzymes She has intermittent elevated liver enzymes, 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   All questions were answered. The patient knows to call the clinic with any problems, questions or concerns. The total time spent in the appointment was 20 minutes encounter with patients including review of chart and various tests results, discussions about plan of care and coordination of care plan   [name] Lark, MD 04/06/2020 8:36 AM  INTERVAL HISTORY: Please see below for problem oriented charting. 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 14.7 cm poorly defined soft tissue mass in central pelvis  suspicious for primary ovarian carcinoma, with uterine leiomyosarcoma considered a less likely differential diagnosis. 2. Moderate ascites and diffuse peritoneal carcinomatosis. 3. Several small uterine fibroids.   11/13/2018 Tumor Marker   Patient's tumor was tested for the following markers: CA-125 Results of the tumor marker test revealed 1434   11/21/2018 Procedure   Successful ultrasound-guided diagnostic and therapeutic paracentesis yielding 3.1 liters of peritoneal fluid   11/21/2018 Pathology Results   PERITONEAL/ASCITIC FLUID(SPECIMEN 1 OF 1 COLLECTED 11/21/18): ADENOCARCINOMA. Specimen Clinical Information Pelvic mass suspicious for ovarian cancer Source Peritoneal/Ascitic Fluid, (specimen 1 of 1 collected 11/21/18) Gross Specimen: Received is/are 1000 ccs of dark amber fluid. (CM:cm) Prepared: # Smears: 0 # Concentration Technique Slides (i.e. 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   11/29/2018 Initial Diagnosis   Ovarian cancer ([place])   11/29/2018 Cancer Staging   Staging form: Ovary, Fallopian Tube, and Primary Peritoneal Carcinoma, AJCC 8th Edition - Clinical: cT3, cN0, cM0 - Signed by [name] Lark, MD on 11/29/2018   12/03/2018 Procedure   Successful ultrasound-guided therapeutic paracentesis yielding 3.7 liters of peritoneal fluid.     12/06/2018 Procedure   Placement of a subcutaneous port device. Catheter tip at the SVC and right atrium junction   12/14/2018 Procedure   Successful ultrasound-guided paracentesis yielding 2.9 L of peritoneal fluid   12/28/2018 Tumor Marker   Patient's tumor was tested for the following markers: CA-125 Results of the tumor marker test revealed 812.   12/28/2018 - 04/22/2019 Chemotherapy   The patient had carboplatin and taxol for neoadjuvant treatment, followed by interval debulking surgery and subsequent adjuvant chemotherapy treatment.  02/01/2019 Imaging   Ct abdomen and pelvis 8.6 cm left ovarian mass, corresponding to the patient's known primary neoplasm, improved.   Mild peritoneal nodularity/omental caking, improved.   Small abdominopelvic ascites, improved.     02/01/2019 Tumor Marker   Patient's tumor was tested for the following markers: CA-125 Results of the tumor marker test revealed 183.   02/12/2019 Surgery   Preoperative Diagnosis: Stage IIIC ovarian cancer, s/p neoadjuvant chemotherapy      Procedure(s) Performed: 1. Exploratory laparotomy with total abdominal hysterectomy, bilateral salpingo-oophorectomy, omentectomy radical tumor debulking for ovarian cancer.   Surgeon: Thereasa Solo, MD.    Operative Findings: upper abdomen free of disease. No visible omental disease. Small volume (200cc) ascites. 8cm friable mass replacing left ovary and adherent to the sigmoid colon mesentery and ureter on the left.  Anterior fibroid. This represented an optimal cytoreduction (R0) with no gross visible disease remaining.    02/12/2019 Pathology Results   A. OVARY AND FALLOPIAN TUBE, LEFT, SALPINGO-OOPHORECTOMY: - Serous carcinoma, high grade, status post neoadjuvant therapy - See oncology table and comment below B. UTERUS CERVIX WITH RIGHT FALLOPIAN TUBE AND OVARY, HYSTERECTOMY: Uterus: - Serosal surface involved by serous carcinoma - Endomyometrium uninvolved by carcinoma - Benign endometrial polyp (4.1 cm) - Leiomyomata (5.5 cm; largest) - Adenomyosis Cervix: - Uninvolved by carcinoma Left ovary: - Serous carcinoma, high grade Left fallopian tube: - Serous carcinoma, high grade C. SOFT TISSUE, LEFT PELVIC SIDEWALL TUMOR, EXCISION: - Metastatic serous carcinoma, high-grade D. SOFT TISSUE, SIGMOID COLON MESENTERY, EXCISION: - Metastatic serous carcinoma, high-grade E. OMENTUM, TUMOR RESECTION: - Metastatic serous carcinoma, high-grade OVARY or FALLOPIAN TUBE or PRIMARY PERITONEUM: Procedure: Total  hysterectomy and bilateral salpingo-oophorectomy, omentectomy and peritoneal biopsies Specimen Integrity: Fragmented Tumor Site: Left ovary and fallopian tube Ovarian Surface Involvement: Present Fallopian Tube Surface Involvement: Present Tumor Size: 6.3 cm (see comment) Histologic Type: Serous carcinoma Histologic Grade: High-grade Implants: Not applicable Other Tissue/ Organ Involvement: Right ovary, right fallopian tube, omentum, mesentery Largest Extrapelvic Peritoneal Focus: Macroscopic Peritoneal/Ascitic Fluid: Malignant Treatment Effect: No definite or minimal response identified (chemotherapy response score 1 [CRS1] Regional Lymph Nodes: No lymph nodes submitted or found Pathologic Stage Classification (pTNM, AJCC 8th Edition): pT3c, pNX Representative Tumor Block: A4 Comment(s): Additional testing (HER-2, MMR and MSI) are pending. The primary tumor site appears to be the left ovary and fallopian tube. The uterus is only involved on the serosal surface. There is tumor on the anterior peritoneal reflection.  Addendum: Tumor is Her2 negative,MSI stable   02/19/2019 Tumor Marker   Patient's tumor was tested for the following markers: CA-125 Results of the tumor marker test revealed 40.3   04/01/2019 Tumor Marker   Patient's tumor was tested for the following markers: CA-125 Results of the tumor marker test revealed 8.1    Genetic Testing   Negative testing. No pathogenic variants identified on the [employer]. The report date is 04/17/2019.  Somatic genes analyzed through [facility]Next[facility]: ATM, BARD1, BRCA1, BRCA2, BRIP1, CHEK2, MRE11A, NBN, PALB2, RAD51C, RAD51D.  The CancerNext gene panel offered by [employer] includes sequencing and rearrangement analysis for the following 36 genes: APC*, ATM*, AXIN2, BARD1, BMPR1A, BRCA1*, BRCA2*, BRIP1*, CDH1*, CDK4, CDKN2A, CHEK2*, DICER1, MLH1*, MSH2*, MSH3, MSH6*, MUTYH*, NBN, NF1*, NTHL1, PALB2*, PMS2*, PTEN*,  RAD51C*, RAD51D*, RECQL, SMAD4, SMARCA4, STK11 and TP53* (sequencing and deletion/duplication); HOXB13, POLD1 and POLE (sequencing only); EPCAM and GREM1 (deletion/duplication only).    05/21/2019 Imaging   1. Interval hysterectomy, oophorectomy and omentectomy. No evidence of  residual ovarian carcinoma in the pelvis. No evidence of peritoneal disease. No intraperitoneal free fluid. 2. Nonobstructing LEFT renal calculi.  Normal ureters.   05/22/2019 Tumor Marker   Patient's tumor was tested for the following markers: CA-125 Results of the tumor marker test revealed 5.7.   06/03/2019 Procedure   Successful right IJ vein Port-A-Cath explant.   08/26/2019 Tumor Marker   Patient's tumor was tested for the following markers: CA-125. Results of the tumor marker test revealed 4.9   10/03/2019 Imaging   1. New tumor deposits along the capsular surfaces of the liver, spleen, in the left pelvis, and right paracolic gutter, compatible with metastatic disease. 2. Mild dilation of the dorsal pancreatic duct in the pancreatic body and head, cause uncertain. 3. Nonobstructive left nephrolithiasis. 4. Aortic atherosclerosis.   Aortic Atherosclerosis (ICD10-I70.0   10/10/2019 Procedure   Successful placement of a right IJ approach Power Port with ultrasound and fluoroscopic guidance. The catheter is ready for use.     10/11/2019 Tumor Marker   Patient's tumor was tested for the following markers: CA-125 Results of the tumor marker test revealed 19.6.   10/14/2019 -  Chemotherapy   The patient had carboplatin and gemzar for chemotherapy treatment.     11/04/2019 Tumor Marker   Patient's tumor was tested for the following markers: CA-125 Results of the tumor marker test revealed 11.4   11/28/2019 Tumor Marker   Patient's tumor was tested for the following markers: CA-125 Results of the tumor marker test revealed 5.8   12/20/2019 Imaging   1. Significant interval reduction in mixed solid and cystic  nodule in the vicinity of the left ovary, with significant improvement or resolution of multiple pelvic, peritoneal, and organ capsule implants. Resolution of previously seen small volume ascites. Findings are consistent with treatment response of abdominal metastatic disease. 2. Status post hysterectomy, oophorectomy, and omentectomy. 3. Nonobstructive left nephrolithiasis. 4. Aortic Atherosclerosis (ICD10-I70.0).   12/23/2019 Tumor Marker   Patient's tumor was tested for the following markers: CA-125 Results of the tumor marker test revealed 5.0   01/24/2020 Tumor Marker   Patient's tumor was tested for the following markers: CA-125 Results of the tumor marker test revealed 4.8   02/24/2020 Tumor Marker   Patient's tumor was tested for the following markers: CA-125. Results of the tumor marker test revealed 4.3.   03/06/2020 Imaging   1. Status post hysterectomy, bilateral oophorectomy, and omentectomy. 2. Resolution of previously described left adnexal nodularity. 3. No residual disease identified. 4.  Aortic Atherosclerosis (ICD10-I70.0). 5. Left nephrolithiasis.     REVIEW OF SYSTEMS:   Constitutional: Denies fevers, chills or abnormal weight loss Eyes: Denies blurriness of vision Ears, nose, mouth, throat, and face: Denies mucositis or sore throat Respiratory: Denies cough, dyspnea or wheezes Cardiovascular: Denies palpitation, chest discomfort or lower extremity swelling Skin: Denies abnormal skin rashes Lymphatics: Denies new lymphadenopathy or easy bruising Neurological:Denies numbness, tingling or new weaknesses Behavioral/Psych: Mood is stable, no new changes  All other systems were reviewed with the patient and are negative.  I have reviewed the past medical history, past surgical history, social history and family history with the patient and they are unchanged from previous note.  ALLERGIES:  is allergic to skin adhesives [cyanoacrylate], shrimp [shellfish allergy],  and sulfonamide derivatives.  MEDICATIONS:  Current Outpatient Medications  Medication Sig Dispense Refill  . ALPRAZolam (XANAX) 0.25 MG tablet Take 0.25 mg by mouth at bedtime as needed for anxiety.    . carboxymethylcellulose (REFRESH PLUS) 0.5 %  SOLN Place 1 drop into both eyes 2 (two) times daily as needed (dry eyes).    . Cholecalciferol (VITAMIN D3) 25 MCG (1000 UT) CAPS Take 1,000 Units by mouth daily.     [name] Kitchen esomeprazole (NEXIUM) 20 MG capsule Take 20 mg by mouth daily.     [name] Kitchen estradiol (ESTRACE VAGINAL) 0.1 MG/GM vaginal cream Place 1 Applicatorful vaginally 3 (three) times a week. 42.5 g 12  . lidocaine-prilocaine (EMLA) cream Apply 1 application topically as needed. 30 g 11  . loratadine (CLARITIN) 10 MG tablet Take 10 mg by mouth daily as needed for allergies.    . Multiple Vitamin (MULTIVITAMIN WITH MINERALS) TABS tablet Take 1 tablet by mouth daily.    . ondansetron (ZOFRAN) 8 MG tablet Take 1 tablet (8 mg total) by mouth every 8 (eight) hours as needed. 30 tablet 1  . prochlorperazine (COMPAZINE) 10 MG tablet Take 1 tablet (10 mg total) by mouth every 6 (six) hours as needed for nausea or vomiting. 30 tablet 2   No current facility-administered medications for this visit.    PHYSICAL EXAMINATION: ECOG PERFORMANCE STATUS: 1 - Symptomatic but completely ambulatory  Vitals:   04/06/20 0805  BP: (!) 153/80  Pulse: 67  Resp: 18  Temp: (!) 97.4 F (36.3 C)  SpO2: 100%   Filed Weights   04/06/20 0805  Weight: 150 lb (68 kg)    GENERAL:alert, no distress and comfortable SKIN: skin color, texture, turgor are normal, no rashes or significant lesions EYES: normal, Conjunctiva are pink and non-injected, sclera clear OROPHARYNX:no exudate, no erythema and lips, buccal mucosa, and tongue normal  NECK: supple, thyroid normal size, non-tender, without nodularity LYMPH:  no palpable lymphadenopathy in the cervical, axillary or inguinal LUNGS: clear to auscultation and percussion  with normal breathing effort HEART: regular rate & rhythm and no murmurs and no lower extremity edema ABDOMEN:abdomen soft, non-tender and normal bowel sounds Musculoskeletal:no cyanosis of digits and no clubbing  NEURO: alert & oriented x 3 with fluent speech, no focal motor/sensory deficits  LABORATORY DATA:  I have reviewed the data as listed    Component Value Date/Time   NA 143 04/06/2020 0751   K 3.6 04/06/2020 0751   CL 109 04/06/2020 0751   CO2 25 04/06/2020 0751   GLUCOSE 89 04/06/2020 0751   BUN 11 04/06/2020 0751   CREATININE 0.63 04/06/2020 0751   CALCIUM 8.9 04/06/2020 0751   PROT 6.4 (L) 04/06/2020 0751   ALBUMIN 3.8 04/06/2020 0751   AST 53 (H) 04/06/2020 0751   ALT 89 (H) 04/06/2020 0751   ALKPHOS 89 04/06/2020 0751   BILITOT 1.7 (H) 04/06/2020 0751   GFRNONAA >60 04/06/2020 0751   GFRAA >60 02/24/2020 0852    No results found for: SPEP, UPEP  Lab Results  Component Value Date   WBC 4.6 04/06/2020   NEUTROABS 2.6 04/06/2020   HGB 12.7 04/06/2020   HCT 37.3 04/06/2020   MCV 98.4 04/06/2020   PLT 196 04/06/2020      Chemistry      Component Value Date/Time   NA 143 04/06/2020 0751   K 3.6 04/06/2020 0751   CL 109 04/06/2020 0751   CO2 25 04/06/2020 0751   BUN 11 04/06/2020 0751   CREATININE 0.63 04/06/2020 0751      Component Value Date/Time   CALCIUM 8.9 04/06/2020 0751   ALKPHOS 89 04/06/2020 0751   AST 53 (H) 04/06/2020 0751   ALT 89 (H) 04/06/2020 0751   BILITOT  1.7 (H) 04/06/2020 4818

## 2020-04-20 ENCOUNTER — Inpatient Hospital Stay: Payer: Managed Care, Other (non HMO)

## 2020-04-20 ENCOUNTER — Other Ambulatory Visit: Payer: Self-pay

## 2020-04-20 VITALS — BP 154/88 | HR 70 | Temp 98.1°F | Resp 18 | Wt 150.0 lb

## 2020-04-20 DIAGNOSIS — C786 Secondary malignant neoplasm of retroperitoneum and peritoneum: Secondary | ICD-10-CM

## 2020-04-20 DIAGNOSIS — C562 Malignant neoplasm of left ovary: Secondary | ICD-10-CM

## 2020-04-20 DIAGNOSIS — Z7189 Other specified counseling: Secondary | ICD-10-CM

## 2020-04-20 DIAGNOSIS — Z5111 Encounter for antineoplastic chemotherapy: Secondary | ICD-10-CM | POA: Diagnosis not present

## 2020-04-20 LAB — CMP (CANCER CENTER ONLY)
ALT: 83 U/L — ABNORMAL HIGH (ref 0–44)
AST: 55 U/L — ABNORMAL HIGH (ref 15–41)
Albumin: 3.8 g/dL (ref 3.5–5.0)
Alkaline Phosphatase: 84 U/L (ref 38–126)
Anion gap: 8 (ref 5–15)
BUN: 14 mg/dL (ref 8–23)
CO2: 25 mmol/L (ref 22–32)
Calcium: 9.1 mg/dL (ref 8.9–10.3)
Chloride: 109 mmol/L (ref 98–111)
Creatinine: 0.63 mg/dL (ref 0.44–1.00)
GFR, Estimated: >60 mL/min (ref >60)
Glucose, Bld: 98 mg/dL (ref 70–99)
Potassium: 3.6 mmol/L (ref 3.5–5.1)
Sodium: 142 mmol/L (ref 135–145)
Total Bilirubin: 1.6 mg/dL — ABNORMAL HIGH (ref 0.3–1.2)
Total Protein: 6.3 g/dL — ABNORMAL LOW (ref 6.5–8.1)

## 2020-04-20 LAB — CBC WITH DIFFERENTIAL (CANCER CENTER ONLY)
Abs Immature Granulocytes: 0.01 10*3/uL (ref 0.00–0.07)
Basophils Absolute: 0 10*3/uL (ref 0.0–0.1)
Basophils Relative: 0 %
Eosinophils Absolute: 0.1 10*3/uL (ref 0.0–0.5)
Eosinophils Relative: 1 %
HCT: 36.9 % (ref 36.0–46.0)
Hemoglobin: 12.5 g/dL (ref 12.0–15.0)
Immature Granulocytes: 0 %
Lymphocytes Relative: 20 %
Lymphs Abs: 1 10*3/uL (ref 0.7–4.0)
MCH: 33.5 pg (ref 26.0–34.0)
MCHC: 33.9 g/dL (ref 30.0–36.0)
MCV: 98.9 fL (ref 80.0–100.0)
Monocytes Absolute: 0.6 10*3/uL (ref 0.1–1.0)
Monocytes Relative: 12 %
Neutro Abs: 3.4 10*3/uL (ref 1.7–7.7)
Neutrophils Relative %: 67 %
Platelet Count: 171 10*3/uL (ref 150–400)
RBC: 3.73 MIL/uL — ABNORMAL LOW (ref 3.87–5.11)
RDW: 13.9 % (ref 11.5–15.5)
WBC Count: 5.1 10*3/uL (ref 4.0–10.5)
nRBC: 0 % (ref 0.0–0.2)

## 2020-04-20 MED ORDER — ONDANSETRON HCL 8 MG PO TABS
ORAL_TABLET | ORAL | Status: AC
  Filled 2020-04-20: qty 1

## 2020-04-20 MED ORDER — HEPARIN SOD (PORK) LOCK FLUSH 100 UNIT/ML IV SOLN
500.0000 [IU] | Freq: Once | INTRAVENOUS | Status: AC | PRN
  Administered 2020-04-20: 500 [IU]
  Filled 2020-04-20: qty 5

## 2020-04-20 MED ORDER — SODIUM CHLORIDE 0.9% FLUSH
10.0000 mL | INTRAVENOUS | Status: DC | PRN
  Administered 2020-04-20: 10 mL
  Filled 2020-04-20: qty 10

## 2020-04-20 MED ORDER — SODIUM CHLORIDE 0.9% FLUSH
10.0000 mL | Freq: Once | INTRAVENOUS | Status: AC
  Administered 2020-04-20: 10 mL
  Filled 2020-04-20: qty 10

## 2020-04-20 MED ORDER — SODIUM CHLORIDE 0.9 % IV SOLN
Freq: Once | INTRAVENOUS | Status: AC
  Filled 2020-04-20: qty 250

## 2020-04-20 MED ORDER — SODIUM CHLORIDE 0.9 % IV SOLN
480.0000 mg/m2 | Freq: Once | INTRAVENOUS | Status: AC
  Administered 2020-04-20: 798 mg via INTRAVENOUS
  Filled 2020-04-20: qty 20.99

## 2020-04-20 MED ORDER — ONDANSETRON HCL 8 MG PO TABS
8.0000 mg | ORAL_TABLET | Freq: Once | ORAL | Status: AC
  Administered 2020-04-20: 8 mg via ORAL

## 2020-04-20 NOTE — Patient Instructions (Signed)

## 2020-04-20 NOTE — Patient Instructions (Signed)
[place] Discharge Instructions for Patients Receiving Chemotherapy  Today you received the following chemotherapy agent: Gemcitabine  To help prevent nausea and vomiting after your treatment, we encourage you to take your nausea medication as directed by your MD.   If you develop nausea and vomiting that is not controlled by your nausea medication, call the clinic.   BELOW ARE SYMPTOMS THAT SHOULD BE REPORTED IMMEDIATELY:  *FEVER GREATER THAN 100.5 F  *CHILLS WITH OR WITHOUT FEVER  NAUSEA AND VOMITING THAT IS NOT CONTROLLED WITH YOUR NAUSEA MEDICATION  *UNUSUAL SHORTNESS OF BREATH  *UNUSUAL BRUISING OR BLEEDING  TENDERNESS IN MOUTH AND THROAT WITH OR WITHOUT PRESENCE OF ULCERS  *URINARY PROBLEMS  *BOWEL PROBLEMS  UNUSUAL RASH Items with * indicate a potential emergency and should be followed up as soon as possible.  Feel free to call the clinic should you have any questions or concerns. The clinic phone number is [phone].  Please show the [place] at check-in to the Emergency Department and triage nurse.

## 2020-04-21 ENCOUNTER — Telehealth: Payer: Self-pay

## 2020-04-21 LAB — CA 125: Cancer Antigen (CA) 125: 4.6 U/mL (ref 0.0–38.1)

## 2020-04-21 NOTE — Telephone Encounter (Signed)
RN left message regarding stable labs.  Contact information left for call back.

## 2020-04-21 NOTE — Telephone Encounter (Signed)
-----   Message from [name] Lark, MD sent at 04/21/2020  8:37 AM EST ----- Regarding: let her know CA-125 is stable

## 2020-05-04 ENCOUNTER — Encounter: Payer: Self-pay | Admitting: Hematology and Oncology

## 2020-05-04 ENCOUNTER — Inpatient Hospital Stay (HOSPITAL_BASED_OUTPATIENT_CLINIC_OR_DEPARTMENT_OTHER): Payer: Managed Care, Other (non HMO) | Admitting: Hematology and Oncology

## 2020-05-04 ENCOUNTER — Inpatient Hospital Stay: Payer: Managed Care, Other (non HMO) | Attending: Gynecologic Oncology

## 2020-05-04 ENCOUNTER — Inpatient Hospital Stay: Payer: Managed Care, Other (non HMO)

## 2020-05-04 ENCOUNTER — Other Ambulatory Visit: Payer: Self-pay

## 2020-05-04 DIAGNOSIS — C786 Secondary malignant neoplasm of retroperitoneum and peritoneum: Secondary | ICD-10-CM | POA: Insufficient documentation

## 2020-05-04 DIAGNOSIS — C562 Malignant neoplasm of left ovary: Secondary | ICD-10-CM

## 2020-05-04 DIAGNOSIS — R110 Nausea: Secondary | ICD-10-CM

## 2020-05-04 DIAGNOSIS — R748 Abnormal levels of other serum enzymes: Secondary | ICD-10-CM

## 2020-05-04 DIAGNOSIS — Z79899 Other long term (current) drug therapy: Secondary | ICD-10-CM | POA: Insufficient documentation

## 2020-05-04 DIAGNOSIS — T451X5A Adverse effect of antineoplastic and immunosuppressive drugs, initial encounter: Secondary | ICD-10-CM

## 2020-05-04 DIAGNOSIS — M79672 Pain in left foot: Secondary | ICD-10-CM | POA: Insufficient documentation

## 2020-05-04 DIAGNOSIS — R5383 Other fatigue: Secondary | ICD-10-CM

## 2020-05-04 DIAGNOSIS — Z5111 Encounter for antineoplastic chemotherapy: Secondary | ICD-10-CM | POA: Insufficient documentation

## 2020-05-04 DIAGNOSIS — Z7189 Other specified counseling: Secondary | ICD-10-CM

## 2020-05-04 DIAGNOSIS — M79671 Pain in right foot: Secondary | ICD-10-CM | POA: Insufficient documentation

## 2020-05-04 LAB — COMPREHENSIVE METABOLIC PANEL
ALT: 67 U/L — ABNORMAL HIGH (ref 0–44)
AST: 39 U/L (ref 15–41)
Albumin: 3.8 g/dL (ref 3.5–5.0)
Alkaline Phosphatase: 93 U/L (ref 38–126)
Anion gap: 4 — ABNORMAL LOW (ref 5–15)
BUN: 9 mg/dL (ref 8–23)
CO2: 27 mmol/L (ref 22–32)
Calcium: 9.1 mg/dL (ref 8.9–10.3)
Chloride: 110 mmol/L (ref 98–111)
Creatinine, Ser: 0.66 mg/dL (ref 0.44–1.00)
GFR, Estimated: >60 mL/min (ref >60)
Glucose, Bld: 95 mg/dL (ref 70–99)
Potassium: 3.6 mmol/L (ref 3.5–5.1)
Sodium: 141 mmol/L (ref 135–145)
Total Bilirubin: 1.4 mg/dL — ABNORMAL HIGH (ref 0.3–1.2)
Total Protein: 6.5 g/dL (ref 6.5–8.1)

## 2020-05-04 LAB — CBC WITH DIFFERENTIAL (CANCER CENTER ONLY)
Abs Immature Granulocytes: 0.01 10*3/uL (ref 0.00–0.07)
Basophils Absolute: 0 10*3/uL (ref 0.0–0.1)
Basophils Relative: 1 %
Eosinophils Absolute: 0.1 10*3/uL (ref 0.0–0.5)
Eosinophils Relative: 1 %
HCT: 36.1 % (ref 36.0–46.0)
Hemoglobin: 12.4 g/dL (ref 12.0–15.0)
Immature Granulocytes: 0 %
Lymphocytes Relative: 26 %
Lymphs Abs: 1.1 10*3/uL (ref 0.7–4.0)
MCH: 33.6 pg (ref 26.0–34.0)
MCHC: 34.3 g/dL (ref 30.0–36.0)
MCV: 97.8 fL (ref 80.0–100.0)
Monocytes Absolute: 0.6 10*3/uL (ref 0.1–1.0)
Monocytes Relative: 15 %
Neutro Abs: 2.4 10*3/uL (ref 1.7–7.7)
Neutrophils Relative %: 57 %
Platelet Count: 186 10*3/uL (ref 150–400)
RBC: 3.69 MIL/uL — ABNORMAL LOW (ref 3.87–5.11)
RDW: 13.6 % (ref 11.5–15.5)
WBC Count: 4.2 10*3/uL (ref 4.0–10.5)
nRBC: 0 % (ref 0.0–0.2)

## 2020-05-04 MED ORDER — SODIUM CHLORIDE 0.9 % IV SOLN
Freq: Once | INTRAVENOUS | Status: AC
  Filled 2020-05-04: qty 250

## 2020-05-04 MED ORDER — HEPARIN SOD (PORK) LOCK FLUSH 100 UNIT/ML IV SOLN
500.0000 [IU] | Freq: Once | INTRAVENOUS | Status: AC | PRN
  Administered 2020-05-04: 10:00:00 500 [IU]
  Filled 2020-05-04: qty 5

## 2020-05-04 MED ORDER — ONDANSETRON HCL 8 MG PO TABS
ORAL_TABLET | ORAL | Status: AC
  Filled 2020-05-04: qty 1

## 2020-05-04 MED ORDER — DEXAMETHASONE 4 MG PO TABS
ORAL_TABLET | ORAL | Status: AC
  Filled 2020-05-04: qty 2

## 2020-05-04 MED ORDER — SODIUM CHLORIDE 0.9 % IV SOLN
400.0000 mg/m2 | Freq: Once | INTRAVENOUS | Status: AC
  Administered 2020-05-04: 10:00:00 684 mg via INTRAVENOUS
  Filled 2020-05-04: qty 17.99

## 2020-05-04 MED ORDER — ONDANSETRON HCL 8 MG PO TABS
8.0000 mg | ORAL_TABLET | Freq: Once | ORAL | Status: AC
  Administered 2020-05-04: 09:00:00 8 mg via ORAL

## 2020-05-04 MED ORDER — SODIUM CHLORIDE 0.9% FLUSH
10.0000 mL | INTRAVENOUS | Status: DC | PRN
  Administered 2020-05-04: 10:00:00 10 mL
  Filled 2020-05-04: qty 10

## 2020-05-04 MED ORDER — DEXAMETHASONE 4 MG PO TABS
8.0000 mg | ORAL_TABLET | Freq: Once | ORAL | Status: AC
  Administered 2020-05-04: 09:00:00 8 mg via ORAL

## 2020-05-04 MED ORDER — SODIUM CHLORIDE 0.9% FLUSH
10.0000 mL | Freq: Once | INTRAVENOUS | Status: AC
  Administered 2020-05-04: 08:00:00 10 mL
  Filled 2020-05-04: qty 10

## 2020-05-04 NOTE — Patient Instructions (Signed)
[facility] Discharge Instructions for Patients Receiving Chemotherapy  Today you received the following chemotherapy agent: Gemcitabine  To help prevent nausea and vomiting after your treatment, we encourage you to take your nausea medication as directed by your MD.   If you develop nausea and vomiting that is not controlled by your nausea medication, call the clinic.   BELOW ARE SYMPTOMS THAT SHOULD BE REPORTED IMMEDIATELY:  *FEVER GREATER THAN 100.5 F  *CHILLS WITH OR WITHOUT FEVER  NAUSEA AND VOMITING THAT IS NOT CONTROLLED WITH YOUR NAUSEA MEDICATION  *UNUSUAL SHORTNESS OF BREATH  *UNUSUAL BRUISING OR BLEEDING  TENDERNESS IN MOUTH AND THROAT WITH OR WITHOUT PRESENCE OF ULCERS  *URINARY PROBLEMS  *BOWEL PROBLEMS  UNUSUAL RASH Items with * indicate a potential emergency and should be followed up as soon as possible.  Feel free to call the clinic should you have any questions or concerns. The clinic phone number is [phone].  Please show the [place] at check-in to the Emergency Department and triage nurse.

## 2020-05-04 NOTE — Progress Notes (Signed)
Pt tolerated treatment well.  No complaints, stable at discharge.  Ambulatory to lobby.

## 2020-05-04 NOTE — Assessment & Plan Note (Signed)
She continues to have intermittent nausea I plan to reduce the dose of chemo and to add dexamethasone for early nausea She will continue antiemetics

## 2020-05-04 NOTE — Assessment & Plan Note (Signed)
We have extensive discussions about goals of care I plan to introduce additional antiemetics and reduce the dose of chemo to see if she could tolerate this better I plan to order CT imaging next month for further follow-up If CT imaging show no evidence of disease, we can consider stopping chemotherapy if all the measures taken today is still not enough to give her good quality of life

## 2020-05-04 NOTE — Assessment & Plan Note (Signed)
Overall, she tolerated treatment fairly well except for nausea and fatigue She has intermittent elevated liver enzymes secondary to side effects of treatment I plan to adjust her antiemetics I plan to order CT imaging next month for further follow-up

## 2020-05-04 NOTE — Assessment & Plan Note (Signed)
She continues to have intermittent elevated liver enzymes secondary to side effects of treatment I plan to reduce the dose of chemotherapy a little and we will proceed with treatment as scheduled

## 2020-05-04 NOTE — Progress Notes (Signed)
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[place]) Overall, she tolerated treatment fairly well except for nausea and fatigue She has intermittent elevated liver enzymes secondary to side effects of treatment I plan to adjust her antiemetics I plan to order CT imaging next month for further follow-up  Elevated liver enzymes She continues to have intermittent elevated liver enzymes secondary to side effects of treatment I plan to reduce the dose of chemotherapy a little and we will proceed with treatment as scheduled  Chemotherapy-induced nausea She continues to have intermittent nausea I plan to reduce the dose of chemo and to add dexamethasone for early nausea She will continue antiemetics  Chemotherapy-induced fatigue We discussed graduated exercise as tolerated  Goals of care, counseling/discussion We have extensive discussions about goals of care I plan to introduce additional antiemetics and reduce the dose of chemo to see if she could tolerate this better I plan to order CT imaging next month for further follow-up If CT imaging show no evidence of disease, we can consider stopping chemotherapy if all the measures taken today is still not enough to give her good quality of life  Foot pain, bilateral She has bilateral foot pain 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   All questions were answered. The patient knows to call the clinic with any problems, questions or concerns. The total time spent in the appointment was 40 minutes encounter  with patients including review of chart and various tests results, discussions about plan of care and coordination of care plan   [name] Lark, MD 05/04/2020 9:23 AM  INTERVAL HISTORY: Please see below for problem oriented charting. 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 14.7 cm poorly defined soft tissue mass in central pelvis suspicious for primary ovarian carcinoma, with uterine leiomyosarcoma considered a less likely differential diagnosis. 2. Moderate ascites and diffuse peritoneal carcinomatosis. 3. Several small uterine fibroids.   11/13/2018 Tumor Marker   Patient's tumor was tested for the following markers: CA-125 Results of the tumor marker test revealed 1434   11/21/2018 Procedure   Successful ultrasound-guided diagnostic and therapeutic paracentesis yielding 3.1 liters of peritoneal fluid   11/21/2018 Pathology Results   PERITONEAL/ASCITIC FLUID(SPECIMEN 1 OF 1 COLLECTED 11/21/18): ADENOCARCINOMA. Specimen Clinical Information Pelvic mass suspicious for ovarian cancer Source Peritoneal/Ascitic Fluid, (specimen 1 of 1 collected 11/21/18) Gross Specimen: Received is/are 1000 ccs of dark amber fluid. (CM:cm) Prepared: # Smears: 0 # Concentration Technique Slides (i.e. 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  11/29/2018 Initial Diagnosis   Ovarian cancer ([place])   11/29/2018  Cancer Staging   Staging form: Ovary, Fallopian Tube, and Primary Peritoneal Carcinoma, AJCC 8th Edition - Clinical: cT3, cN0, cM0 - Signed by [name] Lark, MD on 11/29/2018   12/03/2018 Procedure   Successful ultrasound-guided therapeutic paracentesis yielding 3.7 liters of peritoneal fluid.     12/06/2018 Procedure   Placement of a subcutaneous port device. Catheter tip at the SVC and right atrium junction   12/14/2018 Procedure   Successful ultrasound-guided paracentesis yielding 2.9 L of peritoneal fluid   12/28/2018 Tumor Marker   Patient's tumor was tested for the following markers: CA-125 Results of the tumor marker test revealed 812.   12/28/2018 - 04/22/2019 Chemotherapy   The patient had carboplatin and taxol for neoadjuvant treatment, followed by interval debulking surgery and subsequent adjuvant chemotherapy treatment.     02/01/2019 Imaging   Ct abdomen and pelvis 8.6 cm left ovarian mass, corresponding to the patient's known primary neoplasm, improved.   Mild peritoneal nodularity/omental caking, improved.   Small abdominopelvic ascites, improved.     02/01/2019 Tumor Marker   Patient's tumor was tested for the following markers: CA-125 Results of the tumor marker test revealed 183.   02/12/2019 Surgery   Preoperative Diagnosis: Stage IIIC ovarian cancer, s/p neoadjuvant chemotherapy      Procedure(s) Performed: 1. Exploratory laparotomy with total abdominal hysterectomy, bilateral salpingo-oophorectomy, omentectomy radical tumor debulking for ovarian cancer.   Surgeon: Thereasa Solo, MD.    Operative Findings: upper abdomen free of disease. No visible omental disease. Small volume (200cc) ascites. 8cm friable mass replacing left ovary and adherent to the sigmoid colon mesentery and ureter on the left.  Anterior fibroid. This represented an optimal cytoreduction (R0) with no gross visible disease remaining.    02/12/2019 Pathology Results   A. OVARY AND FALLOPIAN TUBE,  LEFT, SALPINGO-OOPHORECTOMY: - Serous carcinoma, high grade, status post neoadjuvant therapy - See oncology table and comment below B. UTERUS CERVIX WITH RIGHT FALLOPIAN TUBE AND OVARY, HYSTERECTOMY: Uterus: - Serosal surface involved by serous carcinoma - Endomyometrium uninvolved by carcinoma - Benign endometrial polyp (4.1 cm) - Leiomyomata (5.5 cm; largest) - Adenomyosis Cervix: - Uninvolved by carcinoma Left ovary: - Serous carcinoma, high grade Left fallopian tube: - Serous carcinoma, high grade C. SOFT TISSUE, LEFT PELVIC SIDEWALL TUMOR, EXCISION: - Metastatic serous carcinoma, high-grade D. SOFT TISSUE, SIGMOID COLON MESENTERY, EXCISION: - Metastatic serous carcinoma, high-grade E. OMENTUM, TUMOR RESECTION: - Metastatic serous carcinoma, high-grade OVARY or FALLOPIAN TUBE or PRIMARY PERITONEUM: Procedure: Total hysterectomy and bilateral salpingo-oophorectomy, omentectomy and peritoneal biopsies Specimen Integrity: Fragmented Tumor Site: Left ovary and fallopian tube Ovarian Surface Involvement: Present Fallopian Tube Surface Involvement: Present Tumor Size: 6.3 cm (see comment) Histologic Type: Serous carcinoma Histologic Grade: High-grade Implants: Not applicable Other Tissue/ Organ Involvement: Right ovary, right fallopian tube, omentum, mesentery Largest Extrapelvic Peritoneal Focus: Macroscopic Peritoneal/Ascitic Fluid: Malignant Treatment Effect: No definite or minimal response identified (chemotherapy response score 1 [CRS1] Regional Lymph Nodes: No lymph nodes submitted or found Pathologic Stage Classification (pTNM, AJCC 8th Edition): pT3c, pNX Representative Tumor Block: A4 Comment(s): Additional testing (HER-2, MMR and MSI) are pending. The primary tumor site appears to be the left ovary and fallopian tube. The uterus is only involved on the serosal surface. There is tumor on the anterior peritoneal reflection.  Addendum: Tumor is Her2 negative,MSI stable    02/19/2019 Tumor Marker   Patient's tumor was tested for the following markers: CA-125 Results of the  tumor marker test revealed 40.3   04/01/2019 Tumor Marker   Patient's tumor was tested for the following markers: CA-125 Results of the tumor marker test revealed 8.1    Genetic Testing   Negative testing. No pathogenic variants identified on the [employer]. The report date is 04/17/2019.  Somatic genes analyzed through [facility]: ATM, BARD1, BRCA1, BRCA2, BRIP1, CHEK2, MRE11A, NBN, PALB2, RAD51C, RAD51D.  The CancerNext gene panel offered by [employer] includes sequencing and rearrangement analysis for the following 36 genes: APC*, ATM*, AXIN2, BARD1, BMPR1A, BRCA1*, BRCA2*, BRIP1*, CDH1*, CDK4, CDKN2A, CHEK2*, DICER1, MLH1*, MSH2*, MSH3, MSH6*, MUTYH*, NBN, NF1*, NTHL1, PALB2*, PMS2*, PTEN*, RAD51C*, RAD51D*, RECQL, SMAD4, SMARCA4, STK11 and TP53* (sequencing and deletion/duplication); HOXB13, POLD1 and POLE (sequencing only); EPCAM and GREM1 (deletion/duplication only).    05/21/2019 Imaging   1. Interval hysterectomy, oophorectomy and omentectomy. No evidence of residual ovarian carcinoma in the pelvis. No evidence of peritoneal disease. No intraperitoneal free fluid. 2. Nonobstructing LEFT renal calculi.  Normal ureters.   05/22/2019 Tumor Marker   Patient's tumor was tested for the following markers: CA-125 Results of the tumor marker test revealed 5.7.   06/03/2019 Procedure   Successful right IJ vein Port-A-Cath explant.   08/26/2019 Tumor Marker   Patient's tumor was tested for the following markers: CA-125. Results of the tumor marker test revealed 4.9   10/03/2019 Imaging   1. New tumor deposits along the capsular surfaces of the liver, spleen, in the left pelvis, and right paracolic gutter, compatible with metastatic disease. 2. Mild dilation of the dorsal pancreatic duct in the pancreatic body and head, cause uncertain. 3. Nonobstructive  left nephrolithiasis. 4. Aortic atherosclerosis.   Aortic Atherosclerosis (ICD10-I70.0   10/10/2019 Procedure   Successful placement of a right IJ approach Power Port with ultrasound and fluoroscopic guidance. The catheter is ready for use.     10/11/2019 Tumor Marker   Patient's tumor was tested for the following markers: CA-125 Results of the tumor marker test revealed 19.6.   10/14/2019 -  Chemotherapy   The patient had carboplatin and gemzar for chemotherapy treatment.     11/04/2019 Tumor Marker   Patient's tumor was tested for the following markers: CA-125 Results of the tumor marker test revealed 11.4   11/28/2019 Tumor Marker   Patient's tumor was tested for the following markers: CA-125 Results of the tumor marker test revealed 5.8   12/20/2019 Imaging   1. Significant interval reduction in mixed solid and cystic nodule in the vicinity of the left ovary, with significant improvement or resolution of multiple pelvic, peritoneal, and organ capsule implants. Resolution of previously seen small volume ascites. Findings are consistent with treatment response of abdominal metastatic disease. 2. Status post hysterectomy, oophorectomy, and omentectomy. 3. Nonobstructive left nephrolithiasis. 4. Aortic Atherosclerosis (ICD10-I70.0).   12/23/2019 Tumor Marker   Patient's tumor was tested for the following markers: CA-125 Results of the tumor marker test revealed 5.0   01/24/2020 Tumor Marker   Patient's tumor was tested for the following markers: CA-125 Results of the tumor marker test revealed 4.8   02/24/2020 Tumor Marker   Patient's tumor was tested for the following markers: CA-125. Results of the tumor marker test revealed 4.3.   03/06/2020 Imaging   1. Status post hysterectomy, bilateral oophorectomy, and omentectomy. 2. Resolution of previously described left adnexal nodularity. 3. No residual disease identified. 4.  Aortic Atherosclerosis (ICD10-I70.0). 5. Left  nephrolithiasis.   04/20/2020 Tumor Marker   Patient's tumor was tested for the  following markers: CA-125 Results of the tumor marker test revealed 4.6     REVIEW OF SYSTEMS:   Constitutional: Denies fevers, chills or abnormal weight loss Eyes: Denies blurriness of vision Ears, nose, mouth, throat, and face: Denies mucositis or sore throat Respiratory: Denies cough, dyspnea or wheezes Cardiovascular: Denies palpitation, chest discomfort or lower extremity swelling Skin: Denies abnormal skin rashes Lymphatics: Denies new lymphadenopathy or easy bruising Behavioral/Psych: Mood is stable, no new changes  All other systems were reviewed with the patient and are negative.  I have reviewed the past medical history, past surgical history, social history and family history with the patient and they are unchanged from previous note.  ALLERGIES:  is allergic to skin adhesives [cyanoacrylate], shrimp [shellfish allergy], and sulfonamide derivatives.  MEDICATIONS:  Current Outpatient Medications  Medication Sig Dispense Refill  . ALPRAZolam (XANAX) 0.25 MG tablet Take 0.25 mg by mouth at bedtime as needed for anxiety.    . carboxymethylcellulose (REFRESH PLUS) 0.5 % SOLN Place 1 drop into both eyes 2 (two) times daily as needed (dry eyes).    . Cholecalciferol (VITAMIN D3) 25 MCG (1000 UT) CAPS Take 1,000 Units by mouth daily.     [name] Kitchen esomeprazole (NEXIUM) 20 MG capsule Take 20 mg by mouth daily.     [name] Kitchen estradiol (ESTRACE VAGINAL) 0.1 MG/GM vaginal cream Place 1 Applicatorful vaginally 3 (three) times a week. 42.5 g 12  . lidocaine-prilocaine (EMLA) cream Apply 1 application topically as needed. 30 g 11  . loratadine (CLARITIN) 10 MG tablet Take 10 mg by mouth daily as needed for allergies.    . Multiple Vitamin (MULTIVITAMIN WITH MINERALS) TABS tablet Take 1 tablet by mouth daily.    . ondansetron (ZOFRAN) 8 MG tablet Take 1 tablet (8 mg total) by mouth every 8 (eight) hours as needed. 30 tablet  1  . prochlorperazine (COMPAZINE) 10 MG tablet Take 1 tablet (10 mg total) by mouth every 6 (six) hours as needed for nausea or vomiting. 30 tablet 2   No current facility-administered medications for this visit.   Facility-Administered Medications Ordered in Other Visits  Medication Dose Route Frequency Provider Last Rate Last Admin  . gemcitabine (GEMZAR) 684 mg in sodium chloride 0.9 % 250 mL chemo infusion  400 mg/m2 (Treatment Plan Recorded) Intravenous Once Alvy Bimler, Tahjir Silveria, MD      . heparin lock flush 100 unit/mL  500 Units Intracatheter Once PRN Alvy Bimler, Evangelene Vora, MD      . sodium chloride flush (NS) 0.9 % injection 10 mL  10 mL Intracatheter PRN Alvy Bimler, Oluwanifemi Petitti, MD        PHYSICAL EXAMINATION: ECOG PERFORMANCE STATUS: 1 - Symptomatic but completely ambulatory  Vitals:   05/04/20 0829  BP: 140/85  Pulse: 73  Resp: 17  Temp: (!) 97.5 F (36.4 C)  SpO2: 100%   Filed Weights   05/04/20 0829  Weight: 150 lb 6.4 oz (68.2 kg)    GENERAL:alert, no distress and comfortable SKIN: skin color, texture, turgor are normal, no rashes or significant lesions EYES: normal, Conjunctiva are pink and non-injected, sclera clear OROPHARYNX:no exudate, no erythema and lips, buccal mucosa, and tongue normal  NECK: supple, thyroid normal size, non-tender, without nodularity LYMPH:  no palpable lymphadenopathy in the cervical, axillary or inguinal LUNGS: clear to auscultation and percussion with normal breathing effort HEART: regular rate & rhythm and no murmurs and no lower extremity edema ABDOMEN:abdomen soft, non-tender and normal bowel sounds Musculoskeletal:no cyanosis of digits and no clubbing  NEURO: alert &  oriented x 3 with fluent speech, no focal motor/sensory deficits  LABORATORY DATA:  I have reviewed the data as listed    Component Value Date/Time   NA 141 05/04/2020 0749   K 3.6 05/04/2020 0749   CL 110 05/04/2020 0749   CO2 27 05/04/2020 0749   GLUCOSE 95 05/04/2020 0749   BUN 9  05/04/2020 0749   CREATININE 0.66 05/04/2020 0749   CREATININE 0.63 04/20/2020 0834   CALCIUM 9.1 05/04/2020 0749   PROT 6.5 05/04/2020 0749   ALBUMIN 3.8 05/04/2020 0749   AST 39 05/04/2020 0749   AST 55 (H) 04/20/2020 0834   ALT 67 (H) 05/04/2020 0749   ALT 83 (H) 04/20/2020 0834   ALKPHOS 93 05/04/2020 0749   BILITOT 1.4 (H) 05/04/2020 0749   BILITOT 1.6 (H) 04/20/2020 0834   GFRNONAA >60 05/04/2020 0749   GFRNONAA >60 04/20/2020 0834   GFRAA >60 02/24/2020 0852    No results found for: SPEP, UPEP  Lab Results  Component Value Date   WBC 4.2 05/04/2020   NEUTROABS 2.4 05/04/2020   HGB 12.4 05/04/2020   HCT 36.1 05/04/2020   MCV 97.8 05/04/2020   PLT 186 05/04/2020      Chemistry      Component Value Date/Time   NA 141 05/04/2020 0749   K 3.6 05/04/2020 0749   CL 110 05/04/2020 0749   CO2 27 05/04/2020 0749   BUN 9 05/04/2020 0749   CREATININE 0.66 05/04/2020 0749   CREATININE 0.63 04/20/2020 0834      Component Value Date/Time   CALCIUM 9.1 05/04/2020 0749   ALKPHOS 93 05/04/2020 0749   AST 39 05/04/2020 0749   AST 55 (H) 04/20/2020 0834   ALT 67 (H) 05/04/2020 0749   ALT 83 (H) 04/20/2020 0834   BILITOT 1.4 (H) 05/04/2020 0749   BILITOT 1.6 (H) 04/20/2020 2035

## 2020-05-04 NOTE — Patient Instructions (Signed)

## 2020-05-04 NOTE — Assessment & Plan Note (Signed)
We discussed graduated exercise as tolerated

## 2020-05-04 NOTE — Assessment & Plan Note (Signed)
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

## 2020-05-05 ENCOUNTER — Telehealth: Payer: Self-pay | Admitting: *Deleted

## 2020-05-05 ENCOUNTER — Other Ambulatory Visit: Payer: Self-pay | Admitting: Hematology and Oncology

## 2020-05-05 DIAGNOSIS — C562 Malignant neoplasm of left ovary: Secondary | ICD-10-CM

## 2020-05-05 NOTE — Telephone Encounter (Signed)
Per Dr.Gorsuch, called pt with message below. Pt verbalized understanding. Advise if there were any concerns to call office.

## 2020-05-05 NOTE — Telephone Encounter (Signed)
-----   Message from [name] Lark, MD sent at 05/05/2020  8:13 AM EST ----- Regarding: RE: Scan Hi Letizia Hook,  Please call her back, order is in now  ----- Message ----- From: Flo Shanks, RN Sent: 05/04/2020   3:35 PM EST To: [name] Lark, MD Subject: Scan                                           Hi,  Radiology scheduling called. [facility] called to schedule scan, but they do not  see a order. I told them to tell her to call back in the am to schedule after order entered.

## 2020-05-11 ENCOUNTER — Telehealth: Payer: Self-pay

## 2020-05-11 NOTE — Telephone Encounter (Signed)
She called and left a message to call her.  Called back. Saturday am after taking x2 senokot and Miralax. She started having tenderness/ pain in the right side of her abdomen. She feels the pain with gas and bm's. Denies fever/nausea. X 8 loose stools on Saturday. Less loose stools Sunday. Today she has had x 2 loose stools. She has not taken anything for the loose stools or anything for the pain. She is pushing lots of fluids. She is eating less but is going to eat breakfast.

## 2020-05-11 NOTE — Telephone Encounter (Signed)
Stick with simple BRAT diet She can take 1 imodium if she has another BM

## 2020-05-11 NOTE — Telephone Encounter (Signed)
Called and given below message. Reviewed BRAT diet. She verbalized understanding.

## 2020-05-18 ENCOUNTER — Other Ambulatory Visit: Payer: Self-pay

## 2020-05-18 ENCOUNTER — Inpatient Hospital Stay: Payer: Managed Care, Other (non HMO)

## 2020-05-18 VITALS — BP 165/79 | HR 72 | Temp 98.1°F | Resp 18 | Wt 150.5 lb

## 2020-05-18 DIAGNOSIS — C786 Secondary malignant neoplasm of retroperitoneum and peritoneum: Secondary | ICD-10-CM

## 2020-05-18 DIAGNOSIS — Z7189 Other specified counseling: Secondary | ICD-10-CM

## 2020-05-18 DIAGNOSIS — Z5111 Encounter for antineoplastic chemotherapy: Secondary | ICD-10-CM | POA: Diagnosis not present

## 2020-05-18 DIAGNOSIS — C562 Malignant neoplasm of left ovary: Secondary | ICD-10-CM

## 2020-05-18 LAB — CBC WITH DIFFERENTIAL (CANCER CENTER ONLY)
Abs Immature Granulocytes: 0.02 10*3/uL (ref 0.00–0.07)
Basophils Absolute: 0 10*3/uL (ref 0.0–0.1)
Basophils Relative: 1 %
Eosinophils Absolute: 0.1 10*3/uL (ref 0.0–0.5)
Eosinophils Relative: 2 %
HCT: 35.9 % — ABNORMAL LOW (ref 36.0–46.0)
Hemoglobin: 12.2 g/dL (ref 12.0–15.0)
Immature Granulocytes: 0 %
Lymphocytes Relative: 23 %
Lymphs Abs: 1.2 10*3/uL (ref 0.7–4.0)
MCH: 33.4 pg (ref 26.0–34.0)
MCHC: 34 g/dL (ref 30.0–36.0)
MCV: 98.4 fL (ref 80.0–100.0)
Monocytes Absolute: 0.7 10*3/uL (ref 0.1–1.0)
Monocytes Relative: 12 %
Neutro Abs: 3.4 10*3/uL (ref 1.7–7.7)
Neutrophils Relative %: 62 %
Platelet Count: 197 10*3/uL (ref 150–400)
RBC: 3.65 MIL/uL — ABNORMAL LOW (ref 3.87–5.11)
RDW: 13.6 % (ref 11.5–15.5)
WBC Count: 5.4 10*3/uL (ref 4.0–10.5)
nRBC: 0 % (ref 0.0–0.2)

## 2020-05-18 LAB — CMP (CANCER CENTER ONLY)
ALT: 49 U/L — ABNORMAL HIGH (ref 0–44)
AST: 32 U/L (ref 15–41)
Albumin: 3.8 g/dL (ref 3.5–5.0)
Alkaline Phosphatase: 77 U/L (ref 38–126)
Anion gap: 9 (ref 5–15)
BUN: 12 mg/dL (ref 8–23)
CO2: 24 mmol/L (ref 22–32)
Calcium: 8.8 mg/dL — ABNORMAL LOW (ref 8.9–10.3)
Chloride: 108 mmol/L (ref 98–111)
Creatinine: 0.55 mg/dL (ref 0.44–1.00)
GFR, Estimated: >60 mL/min (ref >60)
Glucose, Bld: 95 mg/dL (ref 70–99)
Potassium: 3.6 mmol/L (ref 3.5–5.1)
Sodium: 141 mmol/L (ref 135–145)
Total Bilirubin: 1.3 mg/dL — ABNORMAL HIGH (ref 0.3–1.2)
Total Protein: 6.4 g/dL — ABNORMAL LOW (ref 6.5–8.1)

## 2020-05-18 MED ORDER — ONDANSETRON HCL 8 MG PO TABS
ORAL_TABLET | ORAL | Status: AC
  Filled 2020-05-18: qty 1

## 2020-05-18 MED ORDER — SODIUM CHLORIDE 0.9% FLUSH
10.0000 mL | Freq: Once | INTRAVENOUS | Status: AC
  Administered 2020-05-18: 08:00:00 10 mL
  Filled 2020-05-18: qty 10

## 2020-05-18 MED ORDER — ONDANSETRON HCL 8 MG PO TABS
8.0000 mg | ORAL_TABLET | Freq: Once | ORAL | Status: AC
  Administered 2020-05-18: 09:00:00 8 mg via ORAL

## 2020-05-18 MED ORDER — SODIUM CHLORIDE 0.9% FLUSH
10.0000 mL | INTRAVENOUS | Status: DC | PRN
  Administered 2020-05-18: 10:00:00 10 mL
  Filled 2020-05-18: qty 10

## 2020-05-18 MED ORDER — DEXAMETHASONE 4 MG PO TABS
ORAL_TABLET | ORAL | Status: AC
  Filled 2020-05-18: qty 2

## 2020-05-18 MED ORDER — DEXAMETHASONE 4 MG PO TABS
8.0000 mg | ORAL_TABLET | Freq: Once | ORAL | Status: AC
  Administered 2020-05-18: 09:00:00 8 mg via ORAL

## 2020-05-18 MED ORDER — SODIUM CHLORIDE 0.9 % IV SOLN
400.0000 mg/m2 | Freq: Once | INTRAVENOUS | Status: AC
  Administered 2020-05-18: 10:00:00 684 mg via INTRAVENOUS
  Filled 2020-05-18: qty 17.99

## 2020-05-18 MED ORDER — SODIUM CHLORIDE 0.9 % IV SOLN
Freq: Once | INTRAVENOUS | Status: AC
  Filled 2020-05-18: qty 250

## 2020-05-18 MED ORDER — HEPARIN SOD (PORK) LOCK FLUSH 100 UNIT/ML IV SOLN
500.0000 [IU] | Freq: Once | INTRAVENOUS | Status: AC | PRN
  Administered 2020-05-18: 10:00:00 500 [IU]
  Filled 2020-05-18: qty 5

## 2020-05-18 NOTE — Patient Instructions (Signed)

## 2020-05-18 NOTE — Patient Instructions (Signed)
[facility] Discharge Instructions for Patients Receiving Chemotherapy  Today you received the following chemotherapy agent: Gemcitabine  To help prevent nausea and vomiting after your treatment, we encourage you to take your nausea medication as directed by your MD.   If you develop nausea and vomiting that is not controlled by your nausea medication, call the clinic.   BELOW ARE SYMPTOMS THAT SHOULD BE REPORTED IMMEDIATELY:  *FEVER GREATER THAN 100.5 F  *CHILLS WITH OR WITHOUT FEVER  NAUSEA AND VOMITING THAT IS NOT CONTROLLED WITH YOUR NAUSEA MEDICATION  *UNUSUAL SHORTNESS OF BREATH  *UNUSUAL BRUISING OR BLEEDING  TENDERNESS IN MOUTH AND THROAT WITH OR WITHOUT PRESENCE OF ULCERS  *URINARY PROBLEMS  *BOWEL PROBLEMS  UNUSUAL RASH Items with * indicate a potential emergency and should be followed up as soon as possible.  Feel free to call the clinic should you have any questions or concerns. The clinic phone number is [phone].  Please show the CHEMO ALERT CARD at check-in to the Emergency Department and triage nurse.   

## 2020-05-19 LAB — CA 125: Cancer Antigen (CA) 125: 4.7 U/mL (ref 0.0–38.1)

## 2020-05-29 ENCOUNTER — Ambulatory Visit (HOSPITAL_COMMUNITY)
Admission: RE | Admit: 2020-05-29 | Discharge: 2020-05-29 | Disposition: A | Discharge status: Home / Self Care | Payer: Managed Care, Other (non HMO) | Source: Ambulatory Visit | Attending: Hematology and Oncology | Admitting: Hematology and Oncology

## 2020-05-29 ENCOUNTER — Inpatient Hospital Stay: Payer: Managed Care, Other (non HMO)

## 2020-05-29 ENCOUNTER — Encounter (HOSPITAL_COMMUNITY): Payer: Self-pay

## 2020-05-29 ENCOUNTER — Other Ambulatory Visit: Payer: Self-pay

## 2020-05-29 ENCOUNTER — Inpatient Hospital Stay: Payer: Managed Care, Other (non HMO) | Attending: Gynecologic Oncology

## 2020-05-29 DIAGNOSIS — C562 Malignant neoplasm of left ovary: Secondary | ICD-10-CM | POA: Insufficient documentation

## 2020-05-29 DIAGNOSIS — Z7189 Other specified counseling: Secondary | ICD-10-CM

## 2020-05-29 DIAGNOSIS — R188 Other ascites: Secondary | ICD-10-CM | POA: Insufficient documentation

## 2020-05-29 DIAGNOSIS — C786 Secondary malignant neoplasm of retroperitoneum and peritoneum: Secondary | ICD-10-CM | POA: Insufficient documentation

## 2020-05-29 DIAGNOSIS — I700 Atherosclerosis of aorta: Secondary | ICD-10-CM | POA: Insufficient documentation

## 2020-05-29 DIAGNOSIS — R748 Abnormal levels of other serum enzymes: Secondary | ICD-10-CM | POA: Insufficient documentation

## 2020-05-29 LAB — CMP (CANCER CENTER ONLY)
ALT: 85 U/L — ABNORMAL HIGH (ref 0–44)
AST: 70 U/L — ABNORMAL HIGH (ref 15–41)
Albumin: 3.8 g/dL (ref 3.5–5.0)
Alkaline Phosphatase: 99 U/L (ref 38–126)
Anion gap: 9 (ref 5–15)
BUN: 9 mg/dL (ref 8–23)
CO2: 24 mmol/L (ref 22–32)
Calcium: 9.2 mg/dL (ref 8.9–10.3)
Chloride: 109 mmol/L (ref 98–111)
Creatinine: 0.63 mg/dL (ref 0.44–1.00)
GFR, Estimated: >60 mL/min (ref >60)
Glucose, Bld: 89 mg/dL (ref 70–99)
Potassium: 3.7 mmol/L (ref 3.5–5.1)
Sodium: 142 mmol/L (ref 135–145)
Total Bilirubin: 1.2 mg/dL (ref 0.3–1.2)
Total Protein: 6.5 g/dL (ref 6.5–8.1)

## 2020-05-29 LAB — CBC WITH DIFFERENTIAL (CANCER CENTER ONLY)
Abs Immature Granulocytes: 0.01 10*3/uL (ref 0.00–0.07)
Basophils Absolute: 0 10*3/uL (ref 0.0–0.1)
Basophils Relative: 0 %
Eosinophils Absolute: 0.1 10*3/uL (ref 0.0–0.5)
Eosinophils Relative: 1 %
HCT: 36.2 % (ref 36.0–46.0)
Hemoglobin: 12.2 g/dL (ref 12.0–15.0)
Immature Granulocytes: 0 %
Lymphocytes Relative: 26 %
Lymphs Abs: 1.2 10*3/uL (ref 0.7–4.0)
MCH: 33.2 pg (ref 26.0–34.0)
MCHC: 33.7 g/dL (ref 30.0–36.0)
MCV: 98.6 fL (ref 80.0–100.0)
Monocytes Absolute: 0.8 10*3/uL (ref 0.1–1.0)
Monocytes Relative: 18 %
Neutro Abs: 2.4 10*3/uL (ref 1.7–7.7)
Neutrophils Relative %: 55 %
Platelet Count: 158 10*3/uL (ref 150–400)
RBC: 3.67 MIL/uL — ABNORMAL LOW (ref 3.87–5.11)
RDW: 13.9 % (ref 11.5–15.5)
WBC Count: 4.5 10*3/uL (ref 4.0–10.5)
nRBC: 0 % (ref 0.0–0.2)

## 2020-05-29 MED ORDER — SODIUM CHLORIDE 0.9% FLUSH
10.0000 mL | Freq: Once | INTRAVENOUS | Status: AC
  Administered 2020-05-29: 10 mL
  Filled 2020-05-29: qty 10

## 2020-05-29 MED ORDER — HEPARIN SOD (PORK) LOCK FLUSH 100 UNIT/ML IV SOLN: 500.0000 [IU] | Freq: Once | INTRAVENOUS | Status: DC

## 2020-05-29 MED ORDER — IOHEXOL 300 MG/ML  SOLN
100.0000 mL | Freq: Once | INTRAMUSCULAR | Status: AC | PRN
  Administered 2020-05-29: 100 mL via INTRAVENOUS

## 2020-05-29 MED ORDER — HEPARIN SOD (PORK) LOCK FLUSH 100 UNIT/ML IV SOLN
INTRAVENOUS | Status: AC
  Administered 2020-05-29: 500 [IU]
  Filled 2020-05-29: qty 5

## 2020-06-01 ENCOUNTER — Encounter: Payer: Self-pay | Admitting: Hematology and Oncology

## 2020-06-01 ENCOUNTER — Telehealth: Payer: Self-pay | Admitting: Hematology and Oncology

## 2020-06-01 ENCOUNTER — Inpatient Hospital Stay: Payer: Managed Care, Other (non HMO)

## 2020-06-01 ENCOUNTER — Other Ambulatory Visit: Payer: Self-pay

## 2020-06-01 ENCOUNTER — Inpatient Hospital Stay (HOSPITAL_BASED_OUTPATIENT_CLINIC_OR_DEPARTMENT_OTHER): Payer: Managed Care, Other (non HMO) | Admitting: Hematology and Oncology

## 2020-06-01 VITALS — BP 144/70 | HR 91 | Temp 98.1°F | Resp 18 | Ht 62.0 in | Wt 148.8 lb

## 2020-06-01 DIAGNOSIS — R748 Abnormal levels of other serum enzymes: Secondary | ICD-10-CM | POA: Diagnosis not present

## 2020-06-01 DIAGNOSIS — Z7189 Other specified counseling: Secondary | ICD-10-CM

## 2020-06-01 DIAGNOSIS — C562 Malignant neoplasm of left ovary: Secondary | ICD-10-CM | POA: Diagnosis present

## 2020-06-01 DIAGNOSIS — C786 Secondary malignant neoplasm of retroperitoneum and peritoneum: Secondary | ICD-10-CM | POA: Diagnosis not present

## 2020-06-01 DIAGNOSIS — R188 Other ascites: Secondary | ICD-10-CM | POA: Diagnosis not present

## 2020-06-01 DIAGNOSIS — I700 Atherosclerosis of aorta: Secondary | ICD-10-CM | POA: Diagnosis not present

## 2020-06-01 NOTE — Assessment & Plan Note (Signed)
We discussed goals of care She is in agreement to proceed with maintenance bevacizumab We discussed prognosis

## 2020-06-01 NOTE — Progress Notes (Signed)
DISCONTINUE OFF PATHWAY REGIMEN - Ovarian   [id number]:Carboplatin AUC=4 D1 + Gemcitabine 800 mg/m2 D1, 8 q21 Days:   A cycle is every 21 days:     Gemcitabine      Carboplatin   **Always confirm dose/schedule in your pharmacy ordering system**  REASON: Other Reason PRIOR TREATMENT: Off Pathway: Carboplatin AUC=4 D1 + Gemcitabine 800 mg/m2 D1, 8 q21 Days TREATMENT RESPONSE: Complete Response (CR)  START OFF PATHWAY REGIMEN - Ovarian   [id number]:Bevacizumab 15 mg/kg q21d:   A cycle is every 21 days:     Bevacizumab-xxxx   **Always confirm dose/schedule in your pharmacy ordering system**  Patient Characteristics: Recurrent or Progressive Disease, Maintenance Therapy BRCA Mutation Status: Absent Therapeutic Status: Recurrent or Progressive Disease Line of Therapy: Maintenance Therapy  Intent of Therapy: Non-Curative / Palliative Intent, Discussed with Patient

## 2020-06-01 NOTE — Telephone Encounter (Signed)
Scheduled appointments per 1/10 sch msg. Spoke to patient who is aware of appointments dates and times.

## 2020-06-01 NOTE — Assessment & Plan Note (Signed)
I have reviewed blood work and CT imaging with the patient She has achieved complete response We discussed the role of maintenance treatment We discussed the risk, benefits, side effects of continuing gemcitabine, bevacizumab or niraparib Ultimately, she is in agreement to start bevacizumab maintenance therapy We discussed the risk of hypertension and proteinuria She is instructed to check her blood pressure twice a day We will start treatment next week and I will see her back prior to cycle 2 of treatment for toxicity review

## 2020-06-01 NOTE — Progress Notes (Signed)
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[facility]) I have reviewed blood work and CT imaging with the patient She has achieved complete response We discussed the role of maintenance treatment We discussed the risk, benefits, side effects of continuing gemcitabine, bevacizumab or niraparib Ultimately, she is in agreement to start bevacizumab maintenance therapy We discussed the risk of hypertension and proteinuria She is instructed to check her blood pressure twice a day We will start treatment next week and I will see her back prior to cycle 2 of treatment for toxicity review  Elevated liver enzymes She has intermittent elevated liver enzymes despite dose adjustment This is likely due to side effects of chemo It does not make her feel good I anticipate it would resolve with discontinuation of chemotherapy Recent CT imaging show no evidence of liver disease  Goals of care, counseling/discussion We discussed goals of care She is in agreement to proceed with maintenance bevacizumab We discussed prognosis   Orders Placed This Encounter  Procedures  . CBC with Differential (Cancer Center Only)    Standing Status:   Standing    Number of Occurrences:   20    Standing Expiration Date:   06/01/2021  . Total Protein, Urine dipstick    Standing Status:   Standing    Number of Occurrences:   20    Standing Expiration Date:   06/01/2021    All questions were answered. The patient knows to call the clinic with any problems, questions or concerns. The total time spent in the appointment was 40 minutes encounter with patients including review of chart and various tests results, discussions about plan of care and coordination of care plan   [name] Lark,  MD 06/01/2020 9:45 AM  INTERVAL HISTORY: Please see below for problem oriented charting. 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 14.7 cm poorly defined soft tissue mass in central pelvis suspicious for primary ovarian carcinoma, with uterine leiomyosarcoma considered a less likely differential diagnosis. 2. Moderate ascites and diffuse peritoneal carcinomatosis. 3. Several small uterine fibroids.   11/13/2018 Tumor Marker   Patient's tumor was tested for the following markers: CA-125 Results of the tumor marker test revealed 1434   11/21/2018 Procedure   Successful ultrasound-guided diagnostic and therapeutic paracentesis yielding 3.1 liters of peritoneal fluid   11/21/2018 Pathology Results   PERITONEAL/ASCITIC FLUID(SPECIMEN 1 OF 1 COLLECTED 11/21/18): ADENOCARCINOMA. Specimen Clinical Information Pelvic mass suspicious for ovarian cancer Source Peritoneal/Ascitic Fluid, (specimen 1 of 1 collected 11/21/18) Gross Specimen: Received is/are 1000 ccs of dark amber fluid. (CM:cm) Prepared: # Smears: 0 # Concentration Technique Slides (i.e. 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   11/29/2018 Initial Diagnosis   Ovarian cancer ([place])   11/29/2018 Cancer Staging   Staging form: Ovary, Fallopian Tube, and Primary Peritoneal  Carcinoma, AJCC 8th Edition - Clinical: cT3, cN0, cM0 - Signed by [name] Lark, MD on 11/29/2018   12/03/2018 Procedure   Successful ultrasound-guided therapeutic  paracentesis yielding 3.7 liters of peritoneal fluid.     12/06/2018 Procedure   Placement of a subcutaneous port device. Catheter tip at the SVC and right atrium junction   12/14/2018 Procedure   Successful ultrasound-guided paracentesis yielding 2.9 L of peritoneal fluid   12/28/2018 Tumor Marker   Patient's tumor was tested for the following markers: CA-125 Results of the tumor marker test revealed 812.   12/28/2018 - 04/22/2019 Chemotherapy   The patient had carboplatin and taxol for neoadjuvant treatment, followed by interval debulking surgery and subsequent adjuvant chemotherapy treatment.     02/01/2019 Imaging   Ct abdomen and pelvis 8.6 cm left ovarian mass, corresponding to the patient's known primary neoplasm, improved.   Mild peritoneal nodularity/omental caking, improved.   Small abdominopelvic ascites, improved.     02/01/2019 Tumor Marker   Patient's tumor was tested for the following markers: CA-125 Results of the tumor marker test revealed 183.   02/12/2019 Surgery   Preoperative Diagnosis: Stage IIIC ovarian cancer, s/p neoadjuvant chemotherapy      Procedure(s) Performed: 1. Exploratory laparotomy with total abdominal hysterectomy, bilateral salpingo-oophorectomy, omentectomy radical tumor debulking for ovarian cancer.   Surgeon: Thereasa Solo, MD.    Operative Findings: upper abdomen free of disease. No visible omental disease. Small volume (200cc) ascites. 8cm friable mass replacing left ovary and adherent to the sigmoid colon mesentery and ureter on the left.  Anterior fibroid. This represented an optimal cytoreduction (R0) with no gross visible disease remaining.    02/12/2019 Pathology Results   A. OVARY AND FALLOPIAN TUBE, LEFT, SALPINGO-OOPHORECTOMY: - Serous carcinoma, high grade, status post neoadjuvant therapy - See oncology table and comment below B. UTERUS CERVIX WITH RIGHT FALLOPIAN TUBE AND OVARY, HYSTERECTOMY: Uterus: - Serosal surface involved by  serous carcinoma - Endomyometrium uninvolved by carcinoma - Benign endometrial polyp (4.1 cm) - Leiomyomata (5.5 cm; largest) - Adenomyosis Cervix: - Uninvolved by carcinoma Left ovary: - Serous carcinoma, high grade Left fallopian tube: - Serous carcinoma, high grade C. SOFT TISSUE, LEFT PELVIC SIDEWALL TUMOR, EXCISION: - Metastatic serous carcinoma, high-grade D. SOFT TISSUE, SIGMOID COLON MESENTERY, EXCISION: - Metastatic serous carcinoma, high-grade E. OMENTUM, TUMOR RESECTION: - Metastatic serous carcinoma, high-grade OVARY or FALLOPIAN TUBE or PRIMARY PERITONEUM: Procedure: Total hysterectomy and bilateral salpingo-oophorectomy, omentectomy and peritoneal biopsies Specimen Integrity: Fragmented Tumor Site: Left ovary and fallopian tube Ovarian Surface Involvement: Present Fallopian Tube Surface Involvement: Present Tumor Size: 6.3 cm (see comment) Histologic Type: Serous carcinoma Histologic Grade: High-grade Implants: Not applicable Other Tissue/ Organ Involvement: Right ovary, right fallopian tube, omentum, mesentery Largest Extrapelvic Peritoneal Focus: Macroscopic Peritoneal/Ascitic Fluid: Malignant Treatment Effect: No definite or minimal response identified (chemotherapy response score 1 [CRS1] Regional Lymph Nodes: No lymph nodes submitted or found Pathologic Stage Classification (pTNM, AJCC 8th Edition): pT3c, pNX Representative Tumor Block: A4 Comment(s): Additional testing (HER-2, MMR and MSI) are pending. The primary tumor site appears to be the left ovary and fallopian tube. The uterus is only involved on the serosal surface. There is tumor on the anterior peritoneal reflection.  Addendum: Tumor is Her2 negative,MSI stable   02/19/2019 Tumor Marker   Patient's tumor was tested for the following markers: CA-125 Results of the tumor marker test revealed 40.3   04/01/2019 Tumor Marker   Patient's tumor was tested for the following markers: CA-125 Results of the  tumor marker test revealed 8.1    Genetic Testing   Negative testing. No pathogenic variants identified on the [employer]. The report date is 04/17/2019.  Somatic genes analyzed through [facility]Next[facility]: ATM, BARD1, BRCA1, BRCA2, BRIP1, CHEK2, MRE11A, NBN, PALB2, RAD51C, RAD51D.  The CancerNext gene panel offered by [employer] includes sequencing and rearrangement analysis for the following 36 genes: APC*, ATM*, AXIN2, BARD1, BMPR1A, BRCA1*, BRCA2*, BRIP1*, CDH1*, CDK4, CDKN2A, CHEK2*, DICER1, MLH1*, MSH2*, MSH3, MSH6*, MUTYH*, NBN, NF1*, NTHL1, PALB2*, PMS2*, PTEN*, RAD51C*, RAD51D*, RECQL, SMAD4, SMARCA4, STK11 and TP53* (sequencing and deletion/duplication); HOXB13, POLD1 and POLE (sequencing only); EPCAM and GREM1 (deletion/duplication only).    05/21/2019 Imaging   1. Interval hysterectomy, oophorectomy and omentectomy. No evidence of residual ovarian carcinoma in the pelvis. No evidence of peritoneal disease. No intraperitoneal free fluid. 2. Nonobstructing LEFT renal calculi.  Normal ureters.   05/22/2019 Tumor Marker   Patient's tumor was tested for the following markers: CA-125 Results of the tumor marker test revealed 5.7.   06/03/2019 Procedure   Successful right IJ vein Port-A-Cath explant.   08/26/2019 Tumor Marker   Patient's tumor was tested for the following markers: CA-125. Results of the tumor marker test revealed 4.9   10/03/2019 Imaging   1. New tumor deposits along the capsular surfaces of the liver, spleen, in the left pelvis, and right paracolic gutter, compatible with metastatic disease. 2. Mild dilation of the dorsal pancreatic duct in the pancreatic body and head, cause uncertain. 3. Nonobstructive left nephrolithiasis. 4. Aortic atherosclerosis.   Aortic Atherosclerosis (ICD10-I70.0   10/10/2019 Procedure   Successful placement of a right IJ approach Power Port with ultrasound and fluoroscopic guidance. The catheter is ready for use.      10/11/2019 Tumor Marker   Patient's tumor was tested for the following markers: CA-125 Results of the tumor marker test revealed 19.6.   10/14/2019 -  Chemotherapy   The patient had carboplatin and gemzar for chemotherapy treatment.     11/04/2019 Tumor Marker   Patient's tumor was tested for the following markers: CA-125 Results of the tumor marker test revealed 11.4   11/28/2019 Tumor Marker   Patient's tumor was tested for the following markers: CA-125 Results of the tumor marker test revealed 5.8   12/20/2019 Imaging   1. Significant interval reduction in mixed solid and cystic nodule in the vicinity of the left ovary, with significant improvement or resolution of multiple pelvic, peritoneal, and organ capsule implants. Resolution of previously seen small volume ascites. Findings are consistent with treatment response of abdominal metastatic disease. 2. Status post hysterectomy, oophorectomy, and omentectomy. 3. Nonobstructive left nephrolithiasis. 4. Aortic Atherosclerosis (ICD10-I70.0).   12/23/2019 Tumor Marker   Patient's tumor was tested for the following markers: CA-125 Results of the tumor marker test revealed 5.0   01/24/2020 Tumor Marker   Patient's tumor was tested for the following markers: CA-125 Results of the tumor marker test revealed 4.8   02/24/2020 Tumor Marker   Patient's tumor was tested for the following markers: CA-125. Results of the tumor marker test revealed 4.3.   03/06/2020 Imaging   1. Status post hysterectomy, bilateral oophorectomy, and omentectomy. 2. Resolution of previously described left adnexal nodularity. 3. No residual disease identified. 4.  Aortic Atherosclerosis (ICD10-I70.0). 5. Left nephrolithiasis.   04/20/2020 Tumor Marker   Patient's tumor was tested for the following markers: CA-125 Results of the tumor marker test revealed 4.6   05/18/2020 Tumor Marker   Patient's tumor was tested for the following  markers: CA-125 Results of  the tumor marker test revealed 4.7   05/29/2020 Imaging   Status post hysterectomy and bilateral salpingo oophorectomy.   No evidence of recurrent or metastatic disease.   06/08/2020 -  Chemotherapy    Patient is on Treatment Plan: OVARIAN BEVACIZUMAB        REVIEW OF SYSTEMS:   Constitutional: Denies fevers, chills or abnormal weight loss Eyes: Denies blurriness of vision Ears, nose, mouth, throat, and face: Denies mucositis or sore throat Respiratory: Denies cough, dyspnea or wheezes Cardiovascular: Denies palpitation, chest discomfort or lower extremity swelling Gastrointestinal:  Denies nausea, heartburn or change in bowel habits Skin: Denies abnormal skin rashes Lymphatics: Denies new lymphadenopathy or easy bruising Neurological:Denies numbness, tingling or new weaknesses Behavioral/Psych: Mood is stable, no new changes  All other systems were reviewed with the patient and are negative.  I have reviewed the past medical history, past surgical history, social history and family history with the patient and they are unchanged from previous note.  ALLERGIES:  is allergic to skin adhesives [cyanoacrylate], shrimp [shellfish allergy], and sulfonamide derivatives.  MEDICATIONS:  Current Outpatient Medications  Medication Sig Dispense Refill  . ALPRAZolam (XANAX) 0.25 MG tablet Take 0.25 mg by mouth at bedtime as needed for anxiety.    . carboxymethylcellulose (REFRESH PLUS) 0.5 % SOLN Place 1 drop into both eyes 2 (two) times daily as needed (dry eyes).    . Cholecalciferol (VITAMIN D3) 25 MCG (1000 UT) CAPS Take 1,000 Units by mouth daily.     [name] Kitchen esomeprazole (NEXIUM) 20 MG capsule Take 20 mg by mouth daily.     [name] Kitchen estradiol (ESTRACE VAGINAL) 0.1 MG/GM vaginal cream Place 1 Applicatorful vaginally 3 (three) times a week. 42.5 g 12  . lidocaine-prilocaine (EMLA) cream Apply 1 application topically as needed. 30 g 11  . loratadine (CLARITIN) 10 MG tablet Take 10 mg by mouth daily as  needed for allergies.    . Multiple Vitamin (MULTIVITAMIN WITH MINERALS) TABS tablet Take 1 tablet by mouth daily.    . ondansetron (ZOFRAN) 8 MG tablet Take 1 tablet (8 mg total) by mouth every 8 (eight) hours as needed. 30 tablet 1  . prochlorperazine (COMPAZINE) 10 MG tablet Take 1 tablet (10 mg total) by mouth every 6 (six) hours as needed for nausea or vomiting. 30 tablet 2   No current facility-administered medications for this visit.    PHYSICAL EXAMINATION: ECOG PERFORMANCE STATUS: 1 - Symptomatic but completely ambulatory  Vitals:   06/01/20 0819  BP: (!) 144/70  Pulse: 91  Resp: 18  Temp: 98.1 F (36.7 C)  SpO2: 100%   Filed Weights   06/01/20 0819  Weight: 148 lb 12.8 oz (67.5 kg)    GENERAL:alert, no distress and comfortable NEURO: alert & oriented x 3 with fluent speech, no focal motor/sensory deficits  LABORATORY DATA:  I have reviewed the data as listed    Component Value Date/Time   NA 142 05/29/2020 0816   K 3.7 05/29/2020 0816   CL 109 05/29/2020 0816   CO2 24 05/29/2020 0816   GLUCOSE 89 05/29/2020 0816   BUN 9 05/29/2020 0816   CREATININE 0.63 05/29/2020 0816   CALCIUM 9.2 05/29/2020 0816   PROT 6.5 05/29/2020 0816   ALBUMIN 3.8 05/29/2020 0816   AST 70 (H) 05/29/2020 0816   ALT 85 (H) 05/29/2020 0816   ALKPHOS 99 05/29/2020 0816   BILITOT 1.2 05/29/2020 0816   GFRNONAA >60 05/29/2020 0816   GFRAA >60  02/24/2020 0852    No results found for: SPEP, UPEP  Lab Results  Component Value Date   WBC 4.5 05/29/2020   NEUTROABS 2.4 05/29/2020   HGB 12.2 05/29/2020   HCT 36.2 05/29/2020   MCV 98.6 05/29/2020   PLT 158 05/29/2020      Chemistry      Component Value Date/Time   NA 142 05/29/2020 0816   K 3.7 05/29/2020 0816   CL 109 05/29/2020 0816   CO2 24 05/29/2020 0816   BUN 9 05/29/2020 0816   CREATININE 0.63 05/29/2020 0816      Component Value Date/Time   CALCIUM 9.2 05/29/2020 0816   ALKPHOS 99 05/29/2020 0816   AST 70 (H)  05/29/2020 0816   ALT 85 (H) 05/29/2020 0816   BILITOT 1.2 05/29/2020 0816       RADIOGRAPHIC STUDIES: I have reviewed imaging studies with the patient I have personally reviewed the radiological images as listed and agreed with the findings in the report. CT ABDOMEN PELVIS W CONTRAST  Result Date: 05/29/2020 CLINICAL DATA:  Ovarian cancer, status post TAH/BSO, chemotherapy ongoing EXAM: CT ABDOMEN AND PELVIS WITH CONTRAST TECHNIQUE: Multidetector CT imaging of the abdomen and pelvis was performed using the standard protocol following bolus administration of intravenous contrast. CONTRAST:  165mL OMNIPAQUE IOHEXOL 300 MG/ML  SOLN COMPARISON:  03/06/2020 FINDINGS: Lower chest: Lung bases are clear. Hepatobiliary: Liver is within normal limits. No suspicious/enhancing hepatic lesions. Gallbladder is unremarkable. No intrahepatic or extrahepatic ductal dilatation. Pancreas: Within normal limits. Spleen: Within normal limits. Adrenals/Urinary Tract: Adrenal glands are within normal limits. Right kidney is within normal limits. Three dominant nonobstructing left upper pole renal calculi measuring up to 7 mm (series 2/image 21). No ureteral or bladder calculi. No hydronephrosis. Bladder is underdistended but unremarkable. Stomach/Bowel: Stomach is notable for a tiny hiatal hernia. No evidence of bowel obstruction. Normal appendix (series 2/image 56). Vascular/Lymphatic: No evidence of abdominal aortic aneurysm. Mild atherosclerotic calcifications of the aortic arch. No suspicious abdominopelvic lymphadenopathy. Reproductive: Status post hysterectomy and bilateral salpingo oophorectomy. No adnexal masses. Other: No abdominopelvic ascites. Status post omentectomy. No frank peritoneal nodularity or omental caking. Musculoskeletal: Mild degenerative changes of the visualized thoracolumbar spine. IMPRESSION: Status post hysterectomy and bilateral salpingo oophorectomy. No evidence of recurrent or metastatic disease.  Additional stable ancillary findings as above. Electronically Signed   By: Julian Hy M.D.   On: 05/29/2020 10:51

## 2020-06-01 NOTE — Assessment & Plan Note (Signed)
She has intermittent elevated liver enzymes despite dose adjustment This is likely due to side effects of chemo It does not make her feel good I anticipate it would resolve with discontinuation of chemotherapy Recent CT imaging show no evidence of liver disease

## 2020-06-01 NOTE — Progress Notes (Signed)
Pharmacist Chemotherapy Monitoring - Initial Assessment    Anticipated start date: 06/08/2020   Regimen:  . Are orders appropriate based on the patient's diagnosis, regimen, and cycle? Yes . Does the plan date match the patient's scheduled date? Yes . Is the sequencing of drugs appropriate? Yes . Are the premedications appropriate for the patient's regimen? Yes . Prior Authorization for treatment is: Approved o If applicable, is the correct biosimilar selected based on the patient's insurance? yes  Organ Function and Labs: [name] Kitchen Are dose adjustments needed based on the patient's renal function, hepatic function, or hematologic function? Yes . Are appropriate labs ordered prior to the start of patient's treatment? Yes . Other organ system assessment, if indicated: bevacizumab: baseline BP . The following baseline labs, if indicated, have been ordered: bevacizumab: urine protein  Dose Assessment: . Are the drug doses appropriate? Yes . Are the following correct: o Drug concentrations Yes o IV fluid compatible with drug Yes o Administration routes Yes o Timing of therapy Yes . If applicable, does the patient have documented access for treatment and/or plans for port-a-cath placement? no . If applicable, have lifetime cumulative doses been properly documented and assessed? yes Lifetime Dose Tracking  . Carboplatin: 4,280 mg = 0.01 % of the maximum lifetime dose of 999,999,999 mg  o   Toxicity Monitoring/Prevention: . The patient has the following take home antiemetics prescribed: Ondansetron and Prochlorperazine . The patient has the following take home medications prescribed: N/A . Medication allergies and previous infusion related reactions, if applicable, have been reviewed and addressed. No . The patient's current medication list has been assessed for drug-drug interactions with their chemotherapy regimen. no significant drug-drug interactions were identified on review.  Order  Review: . Are the treatment plan orders signed? Yes . Is the patient scheduled to see a provider prior to their treatment? No  I verify that I have reviewed each item in the above checklist and answered each question accordingly.  Tyson Parkison D 06/01/2020 11:28 AM

## 2020-06-05 ENCOUNTER — Other Ambulatory Visit: Payer: Self-pay | Admitting: Hematology and Oncology

## 2020-06-05 ENCOUNTER — Telehealth: Payer: Self-pay | Admitting: Hematology and Oncology

## 2020-06-05 ENCOUNTER — Telehealth: Payer: Self-pay | Admitting: Pharmacist

## 2020-06-05 ENCOUNTER — Telehealth: Payer: Self-pay

## 2020-06-05 MED ORDER — NIRAPARIB TOSYLATE 100 MG PO CAPS: 200.0000 mg | ORAL_CAPSULE | Freq: Every day | ORAL | 11 refills | Status: DC

## 2020-06-05 MED FILL — ZEJULA 100 MG CAPS: 100 | 30 days supply | Qty: 60 | Fill #0

## 2020-06-05 NOTE — Telephone Encounter (Signed)
I spoke with her insurance company and did a peer to peer review Her insurance will not approve bevacizumab as maintenance treatment because it was not used in conjunction with chemotherapy I called the patient We discussed the risk, benefits, side effects of using gemcitabine again versus niraparib versus observation After much discussion, she is in agreement to try niraparib I will get insurance prior authorization and I will see her back within 7 to 10 days of start date of treatment for toxicity review

## 2020-06-05 NOTE — Telephone Encounter (Signed)
Obtained a copay card for Zejula for $0 copay  ID: [id number]  Group:[id number] [id number] PCN:[id number]   [place] Patient [place] Phone [phone] Fax [phone] 06/05/2020 12:19 PM

## 2020-06-05 NOTE — Telephone Encounter (Signed)
Oral Chemotherapy Pharmacist Encounter  I spoke with patient today for overview of: Zejula for the maintenance treatment of ovarian cancer with response to platinum-based chemotherapy, planned duration 3 years or until disease progression or unacceptable toxicity.   Counseled patient on administration, dosing, side effects, monitoring, drug-food interactions, safe handling, storage, and disposal.  Patient will take Zejula 100mg  capsules, 2 capsules (200mg ) by mouth once daily, with a glass of water, without regard to food.  Zejula start date: 06/08/20   Adverse effects include but are not limited to: nausea, vomiting, diarrhea, taste changes, mouth sores, fatigue, constipation, decreased blood counts, and joint pain.  Myelodysplastic syndrome/acute myeloid leukemia (MDS/AML) have been reported (rarely) in clinical trials.  Patient updated about close blood count monitoring at the initiation of Zejula for detection of thrombocytopenia.  Patient has anti-emetic on hand and knows to take it if nausea develops.   Patient will obtain anti diarrheal and alert the office of 4 or more loose stools above baseline.    Reviewed with patient importance of keeping a medication schedule and plan for any missed doses. No barriers to medication adherence identified.  Medication reconciliation performed and medication/allergy list updated.  Insurance authorization for Noel Journey has been obtained. Test claim at the pharmacy revealed copayment $250 for 1st fill of Zejula. Copay card obtained for patient bringing out of pocket cost to $0. Patient will pick this up from the [facility] outpatient pharmacy on 06/06/20.  Patient informed the pharmacy will reach out 5-7 days prior to needing next fill of Zejula to coordinate continued medication acquisition to prevent break in therapy.  All questions answered.  Ms. Rumberger voiced understanding and appreciation.   Medication education handout placed in mail for  patient. Patient knows to call the office with questions or concerns. Oral Chemotherapy Clinic phone number provided to patient.   Leron Croak, PharmD, BCPS Hematology/Oncology Clinical Pharmacist [facility] [phone] 06/05/2020 3:50 PM

## 2020-06-05 NOTE — Telephone Encounter (Signed)
Oral Oncology Patient Advocate Encounter  Prior Authorization for Stacy Rivas has been approved.    PA# [id number] Effective dates: 06/05/20 through 06/05/21  Patients co-pay is $250  Oral Oncology Clinic will continue to follow.    [place] [facility] [place] Phone [phone] Fax [phone] 06/05/2020 12:08 PM

## 2020-06-05 NOTE — Telephone Encounter (Signed)
Oral Oncology Pharmacist Encounter  Received new prescription for Zejula (niraparib) for the maintenance treatment of ovarian cancer, planned duration 3 years or until disease progression or unacceptable drug toxicity.  Prescription dose and frequency assessed for appropriateness. Appropriate for therapy initiation.   CMP and CBC w/ Diff from 05/29/20 assessed, noted elevated AST (70 U/L) and ALT (85 U/L), but T. Bili WNL at 1.2 mg/dL - no hepatic dose adjustments required at this time.   Current medication list in Epic reviewed, no relevant/significant DDIs with Zejula identified.  Evaluated chart and no patient barriers to medication adherence noted.   Prescription has been e-scribed to the [facility] for benefits analysis and approval.  Oral Oncology Clinic will continue to follow for insurance authorization, copayment issues, initial counseling and start date.  Leron Croak, PharmD, BCPS Hematology/Oncology Clinical Pharmacist [facility] [phone] 06/05/2020 11:52 AM

## 2020-06-05 NOTE — Telephone Encounter (Signed)
Oral Chemotherapy Pharmacist Encounter   Attempted to reach patient to provide update and offer for initial counseling on oral medication: Zejula (niraparib).   No answer. Left voicemail for patient to call back to discuss details of medication acquisition and initial counseling session.  Leron Croak, PharmD, BCPS Hematology/Oncology Clinical Pharmacist [facility] [phone] 06/05/2020 2:02 PM

## 2020-06-05 NOTE — Telephone Encounter (Signed)
Oral Oncology Patient Advocate Encounter  Received notification from Christella Scheuermann that prior authorization for Zejula is required.  PA submitted on CoverMyMeds Key [id number] Status is pending  Oral Oncology Clinic will continue to follow.  [place] Patient [place] Phone [phone] Fax [phone] 06/05/2020 12:07 PM

## 2020-06-08 ENCOUNTER — Ambulatory Visit: Payer: Managed Care, Other (non HMO)

## 2020-06-08 ENCOUNTER — Other Ambulatory Visit: Payer: Managed Care, Other (non HMO)

## 2020-06-10 ENCOUNTER — Telehealth: Payer: Self-pay | Admitting: Hematology and Oncology

## 2020-06-10 NOTE — Telephone Encounter (Signed)
Scheduled appts per 1/17 sch msg. Pt confirmed appt date and time.  

## 2020-06-15 ENCOUNTER — Telehealth: Payer: Self-pay

## 2020-06-15 NOTE — Telephone Encounter (Signed)
-----   Message from [name] Lark, MD sent at 06/15/2020  7:24 AM EST ----- Regarding: can you call and ask how is her BP

## 2020-06-15 NOTE — Telephone Encounter (Signed)
Nothing to add, I see her in 2 days

## 2020-06-15 NOTE — Telephone Encounter (Signed)
Called and given below message. She is watching her salt/sodium intake. Instructed to take bp BID. She verbalized understanding.  1/18 am bp 151/86, HR 72 and noon 139/83 1/19 am 141/81 and HR 63 1/20 am 125/81, HR 75 1/21 am 149/83, HR 73 and pm 148/84 and HR 83 1/22 and 1/23 she took bp, but could not find the readings. 1/24 am 147/76 and HR 83

## 2020-06-17 ENCOUNTER — Inpatient Hospital Stay: Payer: Managed Care, Other (non HMO)

## 2020-06-17 ENCOUNTER — Other Ambulatory Visit: Payer: Self-pay

## 2020-06-17 ENCOUNTER — Telehealth: Payer: Self-pay | Admitting: Hematology and Oncology

## 2020-06-17 ENCOUNTER — Inpatient Hospital Stay (HOSPITAL_BASED_OUTPATIENT_CLINIC_OR_DEPARTMENT_OTHER): Payer: Managed Care, Other (non HMO) | Admitting: Hematology and Oncology

## 2020-06-17 DIAGNOSIS — C562 Malignant neoplasm of left ovary: Secondary | ICD-10-CM

## 2020-06-17 DIAGNOSIS — F411 Generalized anxiety disorder: Secondary | ICD-10-CM | POA: Diagnosis not present

## 2020-06-17 DIAGNOSIS — Z7189 Other specified counseling: Secondary | ICD-10-CM

## 2020-06-17 DIAGNOSIS — C786 Secondary malignant neoplasm of retroperitoneum and peritoneum: Secondary | ICD-10-CM

## 2020-06-17 DIAGNOSIS — R748 Abnormal levels of other serum enzymes: Secondary | ICD-10-CM | POA: Diagnosis not present

## 2020-06-17 LAB — CMP (CANCER CENTER ONLY)
ALT: 21 U/L (ref 0–44)
AST: 29 U/L (ref 15–41)
Albumin: 4.1 g/dL (ref 3.5–5.0)
Alkaline Phosphatase: 75 U/L (ref 38–126)
Anion gap: 8 (ref 5–15)
BUN: 11 mg/dL (ref 8–23)
CO2: 26 mmol/L (ref 22–32)
Calcium: 9.6 mg/dL (ref 8.9–10.3)
Chloride: 105 mmol/L (ref 98–111)
Creatinine: 0.7 mg/dL (ref 0.44–1.00)
GFR, Estimated: >60 mL/min (ref >60)
Glucose, Bld: 70 mg/dL (ref 70–99)
Potassium: 4 mmol/L (ref 3.5–5.1)
Sodium: 139 mmol/L (ref 135–145)
Total Bilirubin: 2.2 mg/dL — ABNORMAL HIGH (ref 0.3–1.2)
Total Protein: 6.9 g/dL (ref 6.5–8.1)

## 2020-06-17 LAB — CBC WITH DIFFERENTIAL/PLATELET
Abs Immature Granulocytes: 0.01 10*3/uL (ref 0.00–0.07)
Basophils Absolute: 0 10*3/uL (ref 0.0–0.1)
Basophils Relative: 1 %
Eosinophils Absolute: 0 10*3/uL (ref 0.0–0.5)
Eosinophils Relative: 1 %
HCT: 42 % (ref 36.0–46.0)
Hemoglobin: 14.2 g/dL (ref 12.0–15.0)
Immature Granulocytes: 0 %
Lymphocytes Relative: 19 %
Lymphs Abs: 1.1 10*3/uL (ref 0.7–4.0)
MCH: 33.1 pg (ref 26.0–34.0)
MCHC: 33.8 g/dL (ref 30.0–36.0)
MCV: 97.9 fL (ref 80.0–100.0)
Monocytes Absolute: 0.6 10*3/uL (ref 0.1–1.0)
Monocytes Relative: 9 %
Neutro Abs: 4.2 10*3/uL (ref 1.7–7.7)
Neutrophils Relative %: 70 %
Platelets: 180 10*3/uL (ref 150–400)
RBC: 4.29 MIL/uL (ref 3.87–5.11)
RDW: 12.7 % (ref 11.5–15.5)
WBC: 6 10*3/uL (ref 4.0–10.5)
nRBC: 0 % (ref 0.0–0.2)

## 2020-06-17 MED ORDER — ALPRAZOLAM 0.25 MG PO TABS: 0.2500 mg | ORAL_TABLET | Freq: Every evening | ORAL | 2 refills | Status: DC | PRN

## 2020-06-17 MED ORDER — HEPARIN SOD (PORK) LOCK FLUSH 100 UNIT/ML IV SOLN
500.0000 [IU] | Freq: Once | INTRAVENOUS | Status: AC
  Administered 2020-06-17: 500 [IU]
  Filled 2020-06-17: qty 5

## 2020-06-17 MED ORDER — SODIUM CHLORIDE 0.9% FLUSH
10.0000 mL | Freq: Once | INTRAVENOUS | Status: AC
  Administered 2020-06-17: 10 mL
  Filled 2020-06-17: qty 10

## 2020-06-17 MED FILL — ALPRAZolam 0.25 MG TABS: 0.25 | 30 days supply | Qty: 30 | Fill #0

## 2020-06-17 NOTE — Telephone Encounter (Signed)
Scheduled appointments per 12/6 sch msg. Spoke to patient who is aware of appointments dates and times. Gave patient calendar print out.  

## 2020-06-17 NOTE — Progress Notes (Signed)
labs

## 2020-06-18 ENCOUNTER — Encounter: Payer: Self-pay | Admitting: Hematology and Oncology

## 2020-06-18 LAB — CA 125: Cancer Antigen (CA) 125: 4.5 U/mL (ref 0.0–38.1)

## 2020-06-18 NOTE — Assessment & Plan Note (Signed)
She has intermittent elevated liver enzymes but overall improved since discontinuation of chemotherapy Observe for now

## 2020-06-18 NOTE — Progress Notes (Signed)
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[facility]) So far, she tolerated niraparib well although she had worsening anxiety I plan to see her again next week for further follow-up I recommend minimum 3 months before imaging studies  Anxiety state She has generalized anxiety, slightly worse since she started niraparib I recommend her to take Xanax as needed We also discussed other approaches such as meditation and breathing exercises  Elevated liver enzymes She has intermittent elevated liver enzymes but overall improved since discontinuation of chemotherapy Observe for now   No orders of the defined types were placed in this encounter.   All questions were answered. The patient knows to call the clinic with any problems, questions or concerns. The total time spent in the appointment was 20 minutes encounter with patients including review of chart and various tests results, discussions about plan of care and coordination of care plan   [name] Lark, MD 06/18/2020 10:01 AM  INTERVAL HISTORY: Please see below for problem oriented charting. 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 14.7 cm poorly defined soft tissue mass in central pelvis suspicious for primary ovarian carcinoma, with uterine leiomyosarcoma considered a less likely differential diagnosis. 2.  Moderate ascites and diffuse peritoneal carcinomatosis. 3. Several small uterine fibroids.   11/13/2018 Tumor Marker   Patient's tumor was tested for the following markers: CA-125 Results of the tumor marker test revealed 1434   11/21/2018 Procedure   Successful ultrasound-guided diagnostic and therapeutic paracentesis yielding 3.1 liters of peritoneal fluid   11/21/2018 Pathology Results   PERITONEAL/ASCITIC FLUID(SPECIMEN 1 OF 1 COLLECTED 11/21/18): ADENOCARCINOMA. Specimen Clinical Information Pelvic mass suspicious for ovarian cancer Source Peritoneal/Ascitic Fluid, (specimen 1 of 1 collected 11/21/18) Gross Specimen: Received is/are 1000 ccs of dark amber fluid. (CM:cm) Prepared: # Smears: 0 # Concentration Technique Slides (i.e. 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   11/29/2018 Initial Diagnosis   Ovarian cancer ([place])   11/29/2018 Cancer Staging   Staging form: Ovary, Fallopian Tube, and Primary Peritoneal Carcinoma, AJCC 8th Edition - Clinical: cT3, cN0, cM0 - Signed by [name] Lark, MD on 11/29/2018   12/03/2018 Procedure   Successful ultrasound-guided therapeutic paracentesis yielding 3.7 liters of peritoneal fluid.     12/06/2018 Procedure   Placement of a subcutaneous port device. Catheter tip at the SVC and right atrium junction   12/14/2018 Procedure   Successful ultrasound-guided paracentesis yielding 2.9 L of peritoneal fluid   12/28/2018 Tumor Marker   Patient's tumor was tested for the following markers: CA-125 Results of the tumor marker test revealed 812.   12/28/2018 - 04/22/2019 Chemotherapy   The patient had carboplatin and taxol for neoadjuvant treatment, followed by interval debulking surgery and subsequent adjuvant chemotherapy treatment.     02/01/2019 Imaging   Ct abdomen and pelvis 8.6 cm left ovarian mass, corresponding to the patient's known primary  neoplasm,  improved.   Mild peritoneal nodularity/omental caking, improved.   Small abdominopelvic ascites, improved.     02/01/2019 Tumor Marker   Patient's tumor was tested for the following markers: CA-125 Results of the tumor marker test revealed 183.   02/12/2019 Surgery   Preoperative Diagnosis: Stage IIIC ovarian cancer, s/p neoadjuvant chemotherapy      Procedure(s) Performed: 1. Exploratory laparotomy with total abdominal hysterectomy, bilateral salpingo-oophorectomy, omentectomy radical tumor debulking for ovarian cancer.   Surgeon: Thereasa Solo, MD.    Operative Findings: upper abdomen free of disease. No visible omental disease. Small volume (200cc) ascites. 8cm friable mass replacing left ovary and adherent to the sigmoid colon mesentery and ureter on the left.  Anterior fibroid. This represented an optimal cytoreduction (R0) with no gross visible disease remaining.    02/12/2019 Pathology Results   A. OVARY AND FALLOPIAN TUBE, LEFT, SALPINGO-OOPHORECTOMY: - Serous carcinoma, high grade, status post neoadjuvant therapy - See oncology table and comment below B. UTERUS CERVIX WITH RIGHT FALLOPIAN TUBE AND OVARY, HYSTERECTOMY: Uterus: - Serosal surface involved by serous carcinoma - Endomyometrium uninvolved by carcinoma - Benign endometrial polyp (4.1 cm) - Leiomyomata (5.5 cm; largest) - Adenomyosis Cervix: - Uninvolved by carcinoma Left ovary: - Serous carcinoma, high grade Left fallopian tube: - Serous carcinoma, high grade C. SOFT TISSUE, LEFT PELVIC SIDEWALL TUMOR, EXCISION: - Metastatic serous carcinoma, high-grade D. SOFT TISSUE, SIGMOID COLON MESENTERY, EXCISION: - Metastatic serous carcinoma, high-grade E. OMENTUM, TUMOR RESECTION: - Metastatic serous carcinoma, high-grade OVARY or FALLOPIAN TUBE or PRIMARY PERITONEUM: Procedure: Total hysterectomy and bilateral salpingo-oophorectomy, omentectomy and peritoneal biopsies Specimen Integrity: Fragmented Tumor Site:  Left ovary and fallopian tube Ovarian Surface Involvement: Present Fallopian Tube Surface Involvement: Present Tumor Size: 6.3 cm (see comment) Histologic Type: Serous carcinoma Histologic Grade: High-grade Implants: Not applicable Other Tissue/ Organ Involvement: Right ovary, right fallopian tube, omentum, mesentery Largest Extrapelvic Peritoneal Focus: Macroscopic Peritoneal/Ascitic Fluid: Malignant Treatment Effect: No definite or minimal response identified (chemotherapy response score 1 [CRS1] Regional Lymph Nodes: No lymph nodes submitted or found Pathologic Stage Classification (pTNM, AJCC 8th Edition): pT3c, pNX Representative Tumor Block: A4 Comment(s): Additional testing (HER-2, MMR and MSI) are pending. The primary tumor site appears to be the left ovary and fallopian tube. The uterus is only involved on the serosal surface. There is tumor on the anterior peritoneal reflection.  Addendum: Tumor is Her2 negative,MSI stable   02/19/2019 Tumor Marker   Patient's tumor was tested for the following markers: CA-125 Results of the tumor marker test revealed 40.3   04/01/2019 Tumor Marker   Patient's tumor was tested for the following markers: CA-125 Results of the tumor marker test revealed 8.1    Genetic Testing   Negative testing. No pathogenic variants identified on the [employer]. The report date is 04/17/2019.  Somatic genes analyzed through [facility]: ATM, BARD1, BRCA1, BRCA2, BRIP1, CHEK2, MRE11A, NBN, PALB2, RAD51C, RAD51D.  The CancerNext gene panel offered by [employer] includes sequencing and rearrangement analysis for the following 36 genes: APC*, ATM*, AXIN2, BARD1, BMPR1A, BRCA1*, BRCA2*, BRIP1*, CDH1*, CDK4, CDKN2A, CHEK2*, DICER1, MLH1*, MSH2*, MSH3, MSH6*, MUTYH*, NBN, NF1*, NTHL1, PALB2*, PMS2*, PTEN*, RAD51C*, RAD51D*, RECQL, SMAD4, SMARCA4, STK11 and TP53* (sequencing and deletion/duplication); HOXB13, POLD1 and POLE (sequencing  only); EPCAM and GREM1 (deletion/duplication only).    05/21/2019 Imaging   1. Interval hysterectomy, oophorectomy and omentectomy. No evidence of residual ovarian carcinoma in the pelvis. No evidence of peritoneal disease. No intraperitoneal free fluid. 2. Nonobstructing LEFT  renal calculi.  Normal ureters.   05/22/2019 Tumor Marker   Patient's tumor was tested for the following markers: CA-125 Results of the tumor marker test revealed 5.7.   06/03/2019 Procedure   Successful right IJ vein Port-A-Cath explant.   08/26/2019 Tumor Marker   Patient's tumor was tested for the following markers: CA-125. Results of the tumor marker test revealed 4.9   10/03/2019 Imaging   1. New tumor deposits along the capsular surfaces of the liver, spleen, in the left pelvis, and right paracolic gutter, compatible with metastatic disease. 2. Mild dilation of the dorsal pancreatic duct in the pancreatic body and head, cause uncertain. 3. Nonobstructive left nephrolithiasis. 4. Aortic atherosclerosis.   Aortic Atherosclerosis (ICD10-I70.0   10/10/2019 Procedure   Successful placement of a right IJ approach Power Port with ultrasound and fluoroscopic guidance. The catheter is ready for use.     10/11/2019 Tumor Marker   Patient's tumor was tested for the following markers: CA-125 Results of the tumor marker test revealed 19.6.   10/14/2019 -  Chemotherapy   The patient had carboplatin and gemzar for chemotherapy treatment.     11/04/2019 Tumor Marker   Patient's tumor was tested for the following markers: CA-125 Results of the tumor marker test revealed 11.4   11/28/2019 Tumor Marker   Patient's tumor was tested for the following markers: CA-125 Results of the tumor marker test revealed 5.8   12/20/2019 Imaging   1. Significant interval reduction in mixed solid and cystic nodule in the vicinity of the left ovary, with significant improvement or resolution of multiple pelvic, peritoneal, and organ  capsule implants. Resolution of previously seen small volume ascites. Findings are consistent with treatment response of abdominal metastatic disease. 2. Status post hysterectomy, oophorectomy, and omentectomy. 3. Nonobstructive left nephrolithiasis. 4. Aortic Atherosclerosis (ICD10-I70.0).   12/23/2019 Tumor Marker   Patient's tumor was tested for the following markers: CA-125 Results of the tumor marker test revealed 5.0   01/24/2020 Tumor Marker   Patient's tumor was tested for the following markers: CA-125 Results of the tumor marker test revealed 4.8   02/24/2020 Tumor Marker   Patient's tumor was tested for the following markers: CA-125. Results of the tumor marker test revealed 4.3.   03/06/2020 Imaging   1. Status post hysterectomy, bilateral oophorectomy, and omentectomy. 2. Resolution of previously described left adnexal nodularity. 3. No residual disease identified. 4.  Aortic Atherosclerosis (ICD10-I70.0). 5. Left nephrolithiasis.   04/20/2020 Tumor Marker   Patient's tumor was tested for the following markers: CA-125 Results of the tumor marker test revealed 4.6   05/18/2020 Tumor Marker   Patient's tumor was tested for the following markers: CA-125 Results of the tumor marker test revealed 4.7   05/29/2020 Imaging   Status post hysterectomy and bilateral salpingo oophorectomy.   No evidence of recurrent or metastatic disease.   06/08/2020 - 06/08/2020 Chemotherapy   She is taking niraparib         REVIEW OF SYSTEMS:   Constitutional: Denies fevers, chills or abnormal weight loss Eyes: Denies blurriness of vision Ears, nose, mouth, throat, and face: Denies mucositis or sore throat Respiratory: Denies cough, dyspnea or wheezes Cardiovascular: Denies palpitation, chest discomfort or lower extremity swelling Gastrointestinal:  Denies nausea, heartburn or change in bowel habits Skin: Denies abnormal skin rashes Lymphatics: Denies new lymphadenopathy or easy  bruising Neurological:Denies numbness, tingling or new weaknesses All other systems were reviewed with the patient and are negative.  I have reviewed the past medical history,  past surgical history, social history and family history with the patient and they are unchanged from previous note.  ALLERGIES:  is allergic to skin adhesives [cyanoacrylate], shrimp [shellfish allergy], and sulfonamide derivatives.  MEDICATIONS:  Current Outpatient Medications  Medication Sig Dispense Refill  . ALPRAZolam (XANAX) 0.25 MG tablet Take 1 tablet (0.25 mg total) by mouth at bedtime as needed for anxiety. 30 tablet 2  . carboxymethylcellulose (REFRESH PLUS) 0.5 % SOLN Place 1 drop into both eyes 2 (two) times daily as needed (dry eyes).    . Cholecalciferol (VITAMIN D3) 25 MCG (1000 UT) CAPS Take 1,000 Units by mouth daily.     [name] Kitchen esomeprazole (NEXIUM) 20 MG capsule Take 20 mg by mouth daily.     [name] Kitchen estradiol (ESTRACE VAGINAL) 0.1 MG/GM vaginal cream Place 1 Applicatorful vaginally 3 (three) times a week. 42.5 g 12  . lidocaine-prilocaine (EMLA) cream Apply 1 application topically as needed. 30 g 11  . loratadine (CLARITIN) 10 MG tablet Take 10 mg by mouth daily as needed for allergies.    . Multiple Vitamin (MULTIVITAMIN WITH MINERALS) TABS tablet Take 1 tablet by mouth daily.    . niraparib tosylate (ZEJULA) 100 MG capsule Take 2 capsules (200 mg total) by mouth daily. May take at bedtime to reduce nausea and vomiting. 60 capsule 11  . ondansetron (ZOFRAN) 8 MG tablet Take 1 tablet (8 mg total) by mouth every 8 (eight) hours as needed. 30 tablet 1  . prochlorperazine (COMPAZINE) 10 MG tablet Take 1 tablet (10 mg total) by mouth every 6 (six) hours as needed for nausea or vomiting. 30 tablet 2   No current facility-administered medications for this visit.    PHYSICAL EXAMINATION: ECOG PERFORMANCE STATUS: 1 - Symptomatic but completely ambulatory  Vitals:   06/17/20 1224  BP: (!) 155/77  Pulse: 86   Resp: 18  Temp: 98.2 F (36.8 C)  SpO2: 100%   Filed Weights   06/17/20 1224  Weight: 147 lb 12.8 oz (67 kg)    GENERAL:alert, no distress and comfortable SKIN: skin color, texture, turgor are normal, no rashes or significant lesions EYES: normal, Conjunctiva are pink and non-injected, sclera clear OROPHARYNX:no exudate, no erythema and lips, buccal mucosa, and tongue normal  NECK: supple, thyroid normal size, non-tender, without nodularity LYMPH:  no palpable lymphadenopathy in the cervical, axillary or inguinal LUNGS: clear to auscultation and percussion with normal breathing effort HEART: regular rate & rhythm and no murmurs and no lower extremity edema ABDOMEN:abdomen soft, non-tender and normal bowel sounds Musculoskeletal:no cyanosis of digits and no clubbing  NEURO: alert & oriented x 3 with fluent speech, no focal motor/sensory deficits  LABORATORY DATA:  I have reviewed the data as listed    Component Value Date/Time   NA 139 06/17/2020 1207   K 4.0 06/17/2020 1207   CL 105 06/17/2020 1207   CO2 26 06/17/2020 1207   GLUCOSE 70 06/17/2020 1207   BUN 11 06/17/2020 1207   CREATININE 0.70 06/17/2020 1207   CALCIUM 9.6 06/17/2020 1207   PROT 6.9 06/17/2020 1207   ALBUMIN 4.1 06/17/2020 1207   AST 29 06/17/2020 1207   ALT 21 06/17/2020 1207   ALKPHOS 75 06/17/2020 1207   BILITOT 2.2 (H) 06/17/2020 1207   GFRNONAA >60 06/17/2020 1207   GFRAA >60 02/24/2020 0852    No results found for: SPEP, UPEP  Lab Results  Component Value Date   WBC 6.0 06/17/2020   NEUTROABS 4.2 06/17/2020   HGB 14.2 06/17/2020  HCT 42.0 06/17/2020   MCV 97.9 06/17/2020   PLT 180 06/17/2020      Chemistry      Component Value Date/Time   NA 139 06/17/2020 1207   K 4.0 06/17/2020 1207   CL 105 06/17/2020 1207   CO2 26 06/17/2020 1207   BUN 11 06/17/2020 1207   CREATININE 0.70 06/17/2020 1207      Component Value Date/Time   CALCIUM 9.6 06/17/2020 1207   ALKPHOS 75  06/17/2020 1207   AST 29 06/17/2020 1207   ALT 21 06/17/2020 1207   BILITOT 2.2 (H) 06/17/2020 1207       RADIOGRAPHIC STUDIES: I have personally reviewed the radiological images as listed and agreed with the findings in the report. CT ABDOMEN PELVIS W CONTRAST  Result Date: 05/29/2020 CLINICAL DATA:  Ovarian cancer, status post TAH/BSO, chemotherapy ongoing EXAM: CT ABDOMEN AND PELVIS WITH CONTRAST TECHNIQUE: Multidetector CT imaging of the abdomen and pelvis was performed using the standard protocol following bolus administration of intravenous contrast. CONTRAST:  132mL OMNIPAQUE IOHEXOL 300 MG/ML  SOLN COMPARISON:  03/06/2020 FINDINGS: Lower chest: Lung bases are clear. Hepatobiliary: Liver is within normal limits. No suspicious/enhancing hepatic lesions. Gallbladder is unremarkable. No intrahepatic or extrahepatic ductal dilatation. Pancreas: Within normal limits. Spleen: Within normal limits. Adrenals/Urinary Tract: Adrenal glands are within normal limits. Right kidney is within normal limits. Three dominant nonobstructing left upper pole renal calculi measuring up to 7 mm (series 2/image 21). No ureteral or bladder calculi. No hydronephrosis. Bladder is underdistended but unremarkable. Stomach/Bowel: Stomach is notable for a tiny hiatal hernia. No evidence of bowel obstruction. Normal appendix (series 2/image 56). Vascular/Lymphatic: No evidence of abdominal aortic aneurysm. Mild atherosclerotic calcifications of the aortic arch. No suspicious abdominopelvic lymphadenopathy. Reproductive: Status post hysterectomy and bilateral salpingo oophorectomy. No adnexal masses. Other: No abdominopelvic ascites. Status post omentectomy. No frank peritoneal nodularity or omental caking. Musculoskeletal: Mild degenerative changes of the visualized thoracolumbar spine. IMPRESSION: Status post hysterectomy and bilateral salpingo oophorectomy. No evidence of recurrent or metastatic disease. Additional stable  ancillary findings as above. Electronically Signed   By: Julian Hy M.D.   On: 05/29/2020 10:51

## 2020-06-18 NOTE — Assessment & Plan Note (Signed)
So far, she tolerated niraparib well although she had worsening anxiety I plan to see her again next week for further follow-up I recommend minimum 3 months before imaging studies

## 2020-06-18 NOTE — Assessment & Plan Note (Signed)
She has generalized anxiety, slightly worse since she started niraparib I recommend her to take Xanax as needed We also discussed other approaches such as meditation and breathing exercises

## 2020-06-19 MED FILL — LIDOCAINE-PRILOCAINE CREAM: 2.5-2.5 | 30 days supply | Qty: 30 | Fill #1

## 2020-06-24 ENCOUNTER — Inpatient Hospital Stay: Payer: Managed Care, Other (non HMO)

## 2020-06-24 ENCOUNTER — Other Ambulatory Visit: Payer: Self-pay | Admitting: Hematology and Oncology

## 2020-06-24 ENCOUNTER — Inpatient Hospital Stay: Payer: Managed Care, Other (non HMO) | Attending: Gynecologic Oncology

## 2020-06-24 ENCOUNTER — Inpatient Hospital Stay (HOSPITAL_BASED_OUTPATIENT_CLINIC_OR_DEPARTMENT_OTHER): Payer: Managed Care, Other (non HMO) | Admitting: Hematology and Oncology

## 2020-06-24 ENCOUNTER — Other Ambulatory Visit: Payer: Self-pay

## 2020-06-24 ENCOUNTER — Telehealth: Payer: Self-pay | Admitting: Hematology and Oncology

## 2020-06-24 DIAGNOSIS — D6959 Other secondary thrombocytopenia: Secondary | ICD-10-CM | POA: Diagnosis not present

## 2020-06-24 DIAGNOSIS — C786 Secondary malignant neoplasm of retroperitoneum and peritoneum: Secondary | ICD-10-CM | POA: Diagnosis not present

## 2020-06-24 DIAGNOSIS — R748 Abnormal levels of other serum enzymes: Secondary | ICD-10-CM | POA: Insufficient documentation

## 2020-06-24 DIAGNOSIS — C562 Malignant neoplasm of left ovary: Secondary | ICD-10-CM

## 2020-06-24 DIAGNOSIS — I700 Atherosclerosis of aorta: Secondary | ICD-10-CM | POA: Insufficient documentation

## 2020-06-24 DIAGNOSIS — F411 Generalized anxiety disorder: Secondary | ICD-10-CM

## 2020-06-24 DIAGNOSIS — F419 Anxiety disorder, unspecified: Secondary | ICD-10-CM | POA: Insufficient documentation

## 2020-06-24 DIAGNOSIS — D696 Thrombocytopenia, unspecified: Secondary | ICD-10-CM | POA: Diagnosis not present

## 2020-06-24 DIAGNOSIS — Z7189 Other specified counseling: Secondary | ICD-10-CM

## 2020-06-24 LAB — CMP (CANCER CENTER ONLY)
ALT: 23 U/L (ref 0–44)
AST: 31 U/L (ref 15–41)
Albumin: 4.3 g/dL (ref 3.5–5.0)
Alkaline Phosphatase: 80 U/L (ref 38–126)
Anion gap: 7 (ref 5–15)
BUN: 10 mg/dL (ref 8–23)
CO2: 29 mmol/L (ref 22–32)
Calcium: 9.5 mg/dL (ref 8.9–10.3)
Chloride: 105 mmol/L (ref 98–111)
Creatinine: 0.75 mg/dL (ref 0.44–1.00)
GFR, Estimated: >60 mL/min (ref >60)
Glucose, Bld: 106 mg/dL — ABNORMAL HIGH (ref 70–99)
Potassium: 3.8 mmol/L (ref 3.5–5.1)
Sodium: 141 mmol/L (ref 135–145)
Total Bilirubin: 1.9 mg/dL — ABNORMAL HIGH (ref 0.3–1.2)
Total Protein: 7.3 g/dL (ref 6.5–8.1)

## 2020-06-24 LAB — CBC WITH DIFFERENTIAL/PLATELET
Abs Immature Granulocytes: 0.01 10*3/uL (ref 0.00–0.07)
Basophils Absolute: 0 10*3/uL (ref 0.0–0.1)
Basophils Relative: 1 %
Eosinophils Absolute: 0.1 10*3/uL (ref 0.0–0.5)
Eosinophils Relative: 1 %
HCT: 42 % (ref 36.0–46.0)
Hemoglobin: 14.1 g/dL (ref 12.0–15.0)
Immature Granulocytes: 0 %
Lymphocytes Relative: 25 %
Lymphs Abs: 1.7 10*3/uL (ref 0.7–4.0)
MCH: 33.4 pg (ref 26.0–34.0)
MCHC: 33.6 g/dL (ref 30.0–36.0)
MCV: 99.5 fL (ref 80.0–100.0)
Monocytes Absolute: 0.5 10*3/uL (ref 0.1–1.0)
Monocytes Relative: 7 %
Neutro Abs: 4.3 10*3/uL (ref 1.7–7.7)
Neutrophils Relative %: 66 %
Platelets: 127 10*3/uL — ABNORMAL LOW (ref 150–400)
RBC: 4.22 MIL/uL (ref 3.87–5.11)
RDW: 12.7 % (ref 11.5–15.5)
WBC: 6.6 10*3/uL (ref 4.0–10.5)
nRBC: 0 % (ref 0.0–0.2)

## 2020-06-24 NOTE — Telephone Encounter (Signed)
Scheduled appts per 2/1 los. Gave pt a print out of AVS.

## 2020-06-25 ENCOUNTER — Encounter: Payer: Self-pay | Admitting: Hematology and Oncology

## 2020-06-25 ENCOUNTER — Telehealth: Payer: Self-pay

## 2020-06-25 NOTE — Assessment & Plan Note (Signed)
So far, she tolerated treatment well except for mild thrombocytopenia, anxiety and slight reflux Plan to see her again next week for further follow-up She will continue current dose for now

## 2020-06-25 NOTE — Assessment & Plan Note (Signed)
She has intermittent elevated liver enzymes but overall improved since discontinuation of chemotherapy Observe for now 

## 2020-06-25 NOTE — Assessment & Plan Note (Signed)
She has generalized anxiety, slightly worse since she started niraparib I recommend her to take Xanax as needed She is coping better

## 2020-06-25 NOTE — Telephone Encounter (Signed)
Called and scheduled lab appt on 2/8 and virtual visit with Dr. Alvy Bimler. She is aware of appts.

## 2020-06-25 NOTE — Assessment & Plan Note (Signed)
This is due to her treatment Observe closely for now I plan to recheck next week

## 2020-06-25 NOTE — Progress Notes (Signed)
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[facility]) So far, she tolerated treatment well except for mild thrombocytopenia, anxiety and slight reflux Plan to see her again next week for further follow-up She will continue current dose for now  Thrombocytopenia [facility]) This is due to her treatment Observe closely for now I plan to recheck next week  Elevated liver enzymes She has intermittent elevated liver enzymes but overall improved since discontinuation of chemotherapy Observe for now  Anxiety state She has generalized anxiety, slightly worse since she started niraparib I recommend her to take Xanax as needed She is coping better   No orders of the defined types were placed in this encounter.   All questions were answered. The patient knows to call the clinic with any problems, questions or concerns. The total time spent in the appointment was 20 minutes encounter with patients including review of chart and various tests results, discussions about plan of care and coordination of care plan   [name] Lark, MD 06/25/2020 10:11 AM  INTERVAL HISTORY: Please see below for problem oriented charting. She returns for further follow-up She is coping better Her anxiety has improved She denies recent infection, fever or chills The patient denies any recent signs or symptoms of bleeding such as spontaneous epistaxis, hematuria or hematochezia.   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 14.7 cm poorly defined soft tissue mass in central pelvis suspicious for primary ovarian carcinoma, with uterine leiomyosarcoma  considered a less likely differential diagnosis. 2. Moderate ascites and diffuse peritoneal carcinomatosis. 3. Several small uterine fibroids.   11/13/2018 Tumor Marker   Patient's tumor was tested for the following markers: CA-125 Results of the tumor marker test revealed 1434   11/21/2018 Procedure   Successful ultrasound-guided diagnostic and therapeutic paracentesis yielding 3.1 liters of peritoneal fluid   11/21/2018 Pathology Results   PERITONEAL/ASCITIC FLUID(SPECIMEN 1 OF 1 COLLECTED 11/21/18): ADENOCARCINOMA. Specimen Clinical Information Pelvic mass suspicious for ovarian cancer Source Peritoneal/Ascitic Fluid, (specimen 1 of 1 collected 11/21/18) Gross Specimen: Received is/are 1000 ccs of dark amber fluid. (CM:cm) Prepared: # Smears: 0 # Concentration Technique Slides (i.e. 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   11/29/2018 Initial Diagnosis   Ovarian cancer ([place])   11/29/2018 Cancer Staging   Staging form: Ovary, Fallopian Tube, and Primary Peritoneal Carcinoma, AJCC 8th Edition - Clinical: cT3, cN0, cM0 - Signed by [name] Lark, MD on 11/29/2018   12/03/2018 Procedure   Successful ultrasound-guided therapeutic paracentesis yielding 3.7 liters of peritoneal fluid.     12/06/2018 Procedure   Placement of a subcutaneous port device. Catheter tip at the SVC and right atrium junction   12/14/2018 Procedure   Successful ultrasound-guided paracentesis yielding 2.9 L of peritoneal fluid   12/28/2018 Tumor Marker   Patient's tumor was tested for the following markers: CA-125 Results of the tumor marker test revealed 812.   12/28/2018 - 04/22/2019 Chemotherapy   The patient had carboplatin and taxol for neoadjuvant treatment, followed by interval debulking surgery and subsequent adjuvant chemotherapy treatment.     02/01/2019 Imaging   Ct abdomen  and pelvis 8.6 cm left ovarian mass,  corresponding to the patient's known primary neoplasm, improved.   Mild peritoneal nodularity/omental caking, improved.   Small abdominopelvic ascites, improved.     02/01/2019 Tumor Marker   Patient's tumor was tested for the following markers: CA-125 Results of the tumor marker test revealed 183.   02/12/2019 Surgery   Preoperative Diagnosis: Stage IIIC ovarian cancer, s/p neoadjuvant chemotherapy      Procedure(s) Performed: 1. Exploratory laparotomy with total abdominal hysterectomy, bilateral salpingo-oophorectomy, omentectomy radical tumor debulking for ovarian cancer.   Surgeon: Thereasa Solo, MD.    Operative Findings: upper abdomen free of disease. No visible omental disease. Small volume (200cc) ascites. 8cm friable mass replacing left ovary and adherent to the sigmoid colon mesentery and ureter on the left.  Anterior fibroid. This represented an optimal cytoreduction (R0) with no gross visible disease remaining.    02/12/2019 Pathology Results   A. OVARY AND FALLOPIAN TUBE, LEFT, SALPINGO-OOPHORECTOMY: - Serous carcinoma, high grade, status post neoadjuvant therapy - See oncology table and comment below B. UTERUS CERVIX WITH RIGHT FALLOPIAN TUBE AND OVARY, HYSTERECTOMY: Uterus: - Serosal surface involved by serous carcinoma - Endomyometrium uninvolved by carcinoma - Benign endometrial polyp (4.1 cm) - Leiomyomata (5.5 cm; largest) - Adenomyosis Cervix: - Uninvolved by carcinoma Left ovary: - Serous carcinoma, high grade Left fallopian tube: - Serous carcinoma, high grade C. SOFT TISSUE, LEFT PELVIC SIDEWALL TUMOR, EXCISION: - Metastatic serous carcinoma, high-grade D. SOFT TISSUE, SIGMOID COLON MESENTERY, EXCISION: - Metastatic serous carcinoma, high-grade E. OMENTUM, TUMOR RESECTION: - Metastatic serous carcinoma, high-grade OVARY or FALLOPIAN TUBE or PRIMARY PERITONEUM: Procedure: Total hysterectomy and bilateral salpingo-oophorectomy, omentectomy and peritoneal  biopsies Specimen Integrity: Fragmented Tumor Site: Left ovary and fallopian tube Ovarian Surface Involvement: Present Fallopian Tube Surface Involvement: Present Tumor Size: 6.3 cm (see comment) Histologic Type: Serous carcinoma Histologic Grade: High-grade Implants: Not applicable Other Tissue/ Organ Involvement: Right ovary, right fallopian tube, omentum, mesentery Largest Extrapelvic Peritoneal Focus: Macroscopic Peritoneal/Ascitic Fluid: Malignant Treatment Effect: No definite or minimal response identified (chemotherapy response score 1 [CRS1] Regional Lymph Nodes: No lymph nodes submitted or found Pathologic Stage Classification (pTNM, AJCC 8th Edition): pT3c, pNX Representative Tumor Block: A4 Comment(s): Additional testing (HER-2, MMR and MSI) are pending. The primary tumor site appears to be the left ovary and fallopian tube. The uterus is only involved on the serosal surface. There is tumor on the anterior peritoneal reflection.  Addendum: Tumor is Her2 negative,MSI stable   02/19/2019 Tumor Marker   Patient's tumor was tested for the following markers: CA-125 Results of the tumor marker test revealed 40.3   04/01/2019 Tumor Marker   Patient's tumor was tested for the following markers: CA-125 Results of the tumor marker test revealed 8.1    Genetic Testing   Negative testing. No pathogenic variants identified on the [employer]. The report date is 04/17/2019.  Somatic genes analyzed through [facility]: ATM, BARD1, BRCA1, BRCA2, BRIP1, CHEK2, MRE11A, NBN, PALB2, RAD51C, RAD51D.  The CancerNext gene panel offered by [employer] includes sequencing and rearrangement analysis for the following 36 genes: APC*, ATM*, AXIN2, BARD1, BMPR1A, BRCA1*, BRCA2*, BRIP1*, CDH1*, CDK4, CDKN2A, CHEK2*, DICER1, MLH1*, MSH2*, MSH3, MSH6*, MUTYH*, NBN, NF1*, NTHL1, PALB2*, PMS2*, PTEN*, RAD51C*, RAD51D*, RECQL, SMAD4, SMARCA4, STK11 and TP53* (sequencing and  deletion/duplication); HOXB13, POLD1 and POLE (sequencing only); EPCAM and GREM1 (deletion/duplication only).    05/21/2019 Imaging   1. Interval hysterectomy, oophorectomy and omentectomy. No evidence of residual ovarian carcinoma in the  pelvis. No evidence of peritoneal disease. No intraperitoneal free fluid. 2. Nonobstructing LEFT renal calculi.  Normal ureters.   05/22/2019 Tumor Marker   Patient's tumor was tested for the following markers: CA-125 Results of the tumor marker test revealed 5.7.   06/03/2019 Procedure   Successful right IJ vein Port-A-Cath explant.   08/26/2019 Tumor Marker   Patient's tumor was tested for the following markers: CA-125. Results of the tumor marker test revealed 4.9   10/03/2019 Imaging   1. New tumor deposits along the capsular surfaces of the liver, spleen, in the left pelvis, and right paracolic gutter, compatible with metastatic disease. 2. Mild dilation of the dorsal pancreatic duct in the pancreatic body and head, cause uncertain. 3. Nonobstructive left nephrolithiasis. 4. Aortic atherosclerosis.   Aortic Atherosclerosis (ICD10-I70.0   10/10/2019 Procedure   Successful placement of a right IJ approach Power Port with ultrasound and fluoroscopic guidance. The catheter is ready for use.     10/11/2019 Tumor Marker   Patient's tumor was tested for the following markers: CA-125 Results of the tumor marker test revealed 19.6.   10/14/2019 -  Chemotherapy   The patient had carboplatin and gemzar for chemotherapy treatment.     11/04/2019 Tumor Marker   Patient's tumor was tested for the following markers: CA-125 Results of the tumor marker test revealed 11.4   11/28/2019 Tumor Marker   Patient's tumor was tested for the following markers: CA-125 Results of the tumor marker test revealed 5.8   12/20/2019 Imaging   1. Significant interval reduction in mixed solid and cystic nodule in the vicinity of the left ovary, with significant improvement or  resolution of multiple pelvic, peritoneal, and organ capsule implants. Resolution of previously seen small volume ascites. Findings are consistent with treatment response of abdominal metastatic disease. 2. Status post hysterectomy, oophorectomy, and omentectomy. 3. Nonobstructive left nephrolithiasis. 4. Aortic Atherosclerosis (ICD10-I70.0).   12/23/2019 Tumor Marker   Patient's tumor was tested for the following markers: CA-125 Results of the tumor marker test revealed 5.0   01/24/2020 Tumor Marker   Patient's tumor was tested for the following markers: CA-125 Results of the tumor marker test revealed 4.8   02/24/2020 Tumor Marker   Patient's tumor was tested for the following markers: CA-125. Results of the tumor marker test revealed 4.3.   03/06/2020 Imaging   1. Status post hysterectomy, bilateral oophorectomy, and omentectomy. 2. Resolution of previously described left adnexal nodularity. 3. No residual disease identified. 4.  Aortic Atherosclerosis (ICD10-I70.0). 5. Left nephrolithiasis.   04/20/2020 Tumor Marker   Patient's tumor was tested for the following markers: CA-125 Results of the tumor marker test revealed 4.6   05/18/2020 Tumor Marker   Patient's tumor was tested for the following markers: CA-125 Results of the tumor marker test revealed 4.7   05/29/2020 Imaging   Status post hysterectomy and bilateral salpingo oophorectomy.   No evidence of recurrent or metastatic disease.   06/08/2020 - 06/08/2020 Chemotherapy   She is taking niraparib       06/17/2020 Tumor Marker   Patient's tumor was tested for the following markers: CA-125 Results of the tumor marker test revealed 4.5     REVIEW OF SYSTEMS:   Constitutional: Denies fevers, chills or abnormal weight loss Eyes: Denies blurriness of vision Ears, nose, mouth, throat, and face: Denies mucositis or sore throat Respiratory: Denies cough, dyspnea or wheezes Cardiovascular: Denies palpitation, chest  discomfort or lower extremity swelling Gastrointestinal:  Denies nausea, heartburn or change in bowel habits  Skin: Denies abnormal skin rashes Lymphatics: Denies new lymphadenopathy or easy bruising Neurological:Denies numbness, tingling or new weaknesses Behavioral/Psych: Mood is stable, no new changes  All other systems were reviewed with the patient and are negative.  I have reviewed the past medical history, past surgical history, social history and family history with the patient and they are unchanged from previous note.  ALLERGIES:  is allergic to skin adhesives [cyanoacrylate], shrimp [shellfish allergy], and sulfonamide derivatives.  MEDICATIONS:  Current Outpatient Medications  Medication Sig Dispense Refill  . ALPRAZolam (XANAX) 0.25 MG tablet Take 1 tablet (0.25 mg total) by mouth at bedtime as needed for anxiety. 30 tablet 2  . carboxymethylcellulose (REFRESH PLUS) 0.5 % SOLN Place 1 drop into both eyes 2 (two) times daily as needed (dry eyes).    . Cholecalciferol (VITAMIN D3) 25 MCG (1000 UT) CAPS Take 1,000 Units by mouth daily.     [name] Kitchen esomeprazole (NEXIUM) 20 MG capsule Take 20 mg by mouth daily.     [name] Kitchen estradiol (ESTRACE VAGINAL) 0.1 MG/GM vaginal cream Place 1 Applicatorful vaginally 3 (three) times a week. 42.5 g 12  . lidocaine-prilocaine (EMLA) cream Apply 1 application topically as needed. 30 g 11  . loratadine (CLARITIN) 10 MG tablet Take 10 mg by mouth daily as needed for allergies.    . Multiple Vitamin (MULTIVITAMIN WITH MINERALS) TABS tablet Take 1 tablet by mouth daily.    . niraparib tosylate (ZEJULA) 100 MG capsule Take 2 capsules (200 mg total) by mouth daily. May take at bedtime to reduce nausea and vomiting. 60 capsule 11  . ondansetron (ZOFRAN) 8 MG tablet Take 1 tablet (8 mg total) by mouth every 8 (eight) hours as needed. 30 tablet 1  . prochlorperazine (COMPAZINE) 10 MG tablet Take 1 tablet (10 mg total) by mouth every 6 (six) hours as needed for nausea or  vomiting. 30 tablet 2   No current facility-administered medications for this visit.    PHYSICAL EXAMINATION: ECOG PERFORMANCE STATUS: 1 - Symptomatic but completely ambulatory  Vitals:   06/24/20 1431  BP: (!) 166/91  Pulse: 92  Resp: 16  Temp: (!) 97.1 F (36.2 C)  SpO2: 100%   Filed Weights   06/24/20 1431  Weight: 148 lb 12.8 oz (67.5 kg)    GENERAL:alert, no distress and comfortable NEURO: alert & oriented x 3 with fluent speech, no focal motor/sensory deficits  LABORATORY DATA:  I have reviewed the data as listed    Component Value Date/Time   NA 141 06/24/2020 1413   K 3.8 06/24/2020 1413   CL 105 06/24/2020 1413   CO2 29 06/24/2020 1413   GLUCOSE 106 (H) 06/24/2020 1413   BUN 10 06/24/2020 1413   CREATININE 0.75 06/24/2020 1413   CALCIUM 9.5 06/24/2020 1413   PROT 7.3 06/24/2020 1413   ALBUMIN 4.3 06/24/2020 1413   AST 31 06/24/2020 1413   ALT 23 06/24/2020 1413   ALKPHOS 80 06/24/2020 1413   BILITOT 1.9 (H) 06/24/2020 1413   GFRNONAA >60 06/24/2020 1413   GFRAA >60 02/24/2020 0852    No results found for: SPEP, UPEP  Lab Results  Component Value Date   WBC 6.6 06/24/2020   NEUTROABS 4.3 06/24/2020   HGB 14.1 06/24/2020   HCT 42.0 06/24/2020   MCV 99.5 06/24/2020   PLT 127 (L) 06/24/2020      Chemistry      Component Value Date/Time   NA 141 06/24/2020 1413   K 3.8 06/24/2020 1413  CL 105 06/24/2020 1413   CO2 29 06/24/2020 1413   BUN 10 06/24/2020 1413   CREATININE 0.75 06/24/2020 1413      Component Value Date/Time   CALCIUM 9.5 06/24/2020 1413   ALKPHOS 80 06/24/2020 1413   AST 31 06/24/2020 1413   ALT 23 06/24/2020 1413   BILITOT 1.9 (H) 06/24/2020 1413       RADIOGRAPHIC STUDIES: I have personally reviewed the radiological images as listed and agreed with the findings in the report. CT ABDOMEN PELVIS W CONTRAST  Result Date: 05/29/2020 CLINICAL DATA:  Ovarian cancer, status post TAH/BSO, chemotherapy ongoing EXAM: CT  ABDOMEN AND PELVIS WITH CONTRAST TECHNIQUE: Multidetector CT imaging of the abdomen and pelvis was performed using the standard protocol following bolus administration of intravenous contrast. CONTRAST:  140mL OMNIPAQUE IOHEXOL 300 MG/ML  SOLN COMPARISON:  03/06/2020 FINDINGS: Lower chest: Lung bases are clear. Hepatobiliary: Liver is within normal limits. No suspicious/enhancing hepatic lesions. Gallbladder is unremarkable. No intrahepatic or extrahepatic ductal dilatation. Pancreas: Within normal limits. Spleen: Within normal limits. Adrenals/Urinary Tract: Adrenal glands are within normal limits. Right kidney is within normal limits. Three dominant nonobstructing left upper pole renal calculi measuring up to 7 mm (series 2/image 21). No ureteral or bladder calculi. No hydronephrosis. Bladder is underdistended but unremarkable. Stomach/Bowel: Stomach is notable for a tiny hiatal hernia. No evidence of bowel obstruction. Normal appendix (series 2/image 56). Vascular/Lymphatic: No evidence of abdominal aortic aneurysm. Mild atherosclerotic calcifications of the aortic arch. No suspicious abdominopelvic lymphadenopathy. Reproductive: Status post hysterectomy and bilateral salpingo oophorectomy. No adnexal masses. Other: No abdominopelvic ascites. Status post omentectomy. No frank peritoneal nodularity or omental caking. Musculoskeletal: Mild degenerative changes of the visualized thoracolumbar spine. IMPRESSION: Status post hysterectomy and bilateral salpingo oophorectomy. No evidence of recurrent or metastatic disease. Additional stable ancillary findings as above. Electronically Signed   By: Julian Hy M.D.   On: 05/29/2020 10:51

## 2020-06-29 ENCOUNTER — Other Ambulatory Visit: Payer: Managed Care, Other (non HMO)

## 2020-06-29 ENCOUNTER — Ambulatory Visit: Payer: Managed Care, Other (non HMO) | Admitting: Hematology and Oncology

## 2020-06-29 ENCOUNTER — Ambulatory Visit: Payer: Managed Care, Other (non HMO)

## 2020-06-30 ENCOUNTER — Other Ambulatory Visit: Payer: Self-pay

## 2020-06-30 ENCOUNTER — Inpatient Hospital Stay (HOSPITAL_BASED_OUTPATIENT_CLINIC_OR_DEPARTMENT_OTHER): Payer: Managed Care, Other (non HMO) | Admitting: Hematology and Oncology

## 2020-06-30 ENCOUNTER — Inpatient Hospital Stay: Payer: Managed Care, Other (non HMO)

## 2020-06-30 ENCOUNTER — Encounter: Payer: Self-pay | Admitting: Hematology and Oncology

## 2020-06-30 DIAGNOSIS — R748 Abnormal levels of other serum enzymes: Secondary | ICD-10-CM

## 2020-06-30 DIAGNOSIS — D696 Thrombocytopenia, unspecified: Secondary | ICD-10-CM | POA: Diagnosis not present

## 2020-06-30 DIAGNOSIS — C562 Malignant neoplasm of left ovary: Secondary | ICD-10-CM

## 2020-06-30 DIAGNOSIS — F411 Generalized anxiety disorder: Secondary | ICD-10-CM

## 2020-06-30 DIAGNOSIS — R21 Rash and other nonspecific skin eruption: Secondary | ICD-10-CM

## 2020-06-30 LAB — CBC WITH DIFFERENTIAL/PLATELET
Abs Immature Granulocytes: 0.01 10*3/uL (ref 0.00–0.07)
Basophils Absolute: 0 10*3/uL (ref 0.0–0.1)
Basophils Relative: 1 %
Eosinophils Absolute: 0.1 10*3/uL (ref 0.0–0.5)
Eosinophils Relative: 2 %
HCT: 40.1 % (ref 36.0–46.0)
Hemoglobin: 13.7 g/dL (ref 12.0–15.0)
Immature Granulocytes: 0 %
Lymphocytes Relative: 24 %
Lymphs Abs: 1.1 10*3/uL (ref 0.7–4.0)
MCH: 33.4 pg (ref 26.0–34.0)
MCHC: 34.2 g/dL (ref 30.0–36.0)
MCV: 97.8 fL (ref 80.0–100.0)
Monocytes Absolute: 0.4 10*3/uL (ref 0.1–1.0)
Monocytes Relative: 8 %
Neutro Abs: 3.1 10*3/uL (ref 1.7–7.7)
Neutrophils Relative %: 65 %
Platelets: 145 10*3/uL — ABNORMAL LOW (ref 150–400)
RBC: 4.1 MIL/uL (ref 3.87–5.11)
RDW: 12.7 % (ref 11.5–15.5)
WBC: 4.7 10*3/uL (ref 4.0–10.5)
nRBC: 0 % (ref 0.0–0.2)

## 2020-06-30 LAB — COMPREHENSIVE METABOLIC PANEL
ALT: 23 U/L (ref 0–44)
AST: 31 U/L (ref 15–41)
Albumin: 4.2 g/dL (ref 3.5–5.0)
Alkaline Phosphatase: 74 U/L (ref 38–126)
Anion gap: 8 (ref 5–15)
BUN: 10 mg/dL (ref 8–23)
CO2: 26 mmol/L (ref 22–32)
Calcium: 9.1 mg/dL (ref 8.9–10.3)
Chloride: 104 mmol/L (ref 98–111)
Creatinine, Ser: 0.72 mg/dL (ref 0.44–1.00)
GFR, Estimated: >60 mL/min (ref >60)
Glucose, Bld: 94 mg/dL (ref 70–99)
Potassium: 3.8 mmol/L (ref 3.5–5.1)
Sodium: 138 mmol/L (ref 135–145)
Total Bilirubin: 2.2 mg/dL — ABNORMAL HIGH (ref 0.3–1.2)
Total Protein: 6.8 g/dL (ref 6.5–8.1)

## 2020-06-30 NOTE — Assessment & Plan Note (Signed)
She tolerated treatment better Anxiety is less

## 2020-06-30 NOTE — Assessment & Plan Note (Signed)
She has chronic elevated bilirubin Observe closely for now

## 2020-06-30 NOTE — Progress Notes (Signed)
HEMATOLOGY-ONCOLOGY ELECTRONIC VISIT PROGRESS NOTE  Patient Care Team: Burnard Bunting, MD as PCP - General (Internal Medicine) Awanda Mink Craige Cotta, RN as Oncology Nurse Navigator (Oncology) [name], RN as Registered Nurse  I connected with by [facility] video conference and verified that I am speaking with the correct person using two identifiers.  I discussed the limitations, risks, security and privacy concerns of performing an evaluation and management service by [facility] and the availability of in person appointments.  I also discussed with the patient that there may be a patient responsible charge related to this service. The patient expressed understanding and agreed to proceed.   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[place]) So far, she tolerated treatment well except for mild thrombocytopenia, anxiety and slight reflux Plan to see her again in 2 weeks for further follow-up She will continue current dose for now  Thrombocytopenia [facility]) This is due to her treatment She has no bleeding complications Observe closely for now  Elevated liver enzymes She has chronic elevated bilirubin Observe closely for now  Anxiety state She tolerated treatment better Anxiety is less   Skin rash She has mild skin rash on her feet It is not typical for drug related reaction Observe only for now She can take over-the-counter steroid cream if needed   No orders of the defined types were placed in this encounter.   INTERVAL HISTORY: Please see below for problem oriented charting. The purpose of today's visit is for follow-up while on treatment with niraparib She tolerated treatment better from anxiety standpoint She noticed some skin rash on her feet only that is mildly itchy No recent nausea or constipation The patient denies any recent signs or symptoms of bleeding such as spontaneous epistaxis, hematuria or hematochezia.  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 14.7 cm poorly defined soft tissue mass in central pelvis suspicious for primary ovarian carcinoma, with uterine leiomyosarcoma considered a less likely differential diagnosis. 2. Moderate ascites and diffuse peritoneal carcinomatosis. 3. Several small uterine fibroids.   11/13/2018 Tumor Marker   Patient's tumor was tested for the following markers: CA-125 Results of the tumor marker test revealed 1434   11/21/2018 Procedure   Successful ultrasound-guided diagnostic and therapeutic paracentesis yielding 3.1 liters of peritoneal fluid   11/21/2018 Pathology Results   PERITONEAL/ASCITIC FLUID(SPECIMEN 1 OF 1 COLLECTED 11/21/18): ADENOCARCINOMA. Specimen Clinical Information Pelvic mass suspicious for ovarian cancer Source Peritoneal/Ascitic Fluid, (specimen 1 of 1 collected 11/21/18) Gross Specimen: Received is/are 1000 ccs of dark amber fluid. (CM:cm) Prepared: # Smears: 0 # Concentration Technique Slides (i.e. 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   11/29/2018 Initial Diagnosis   Ovarian cancer ([place])   11/29/2018 Cancer Staging   Staging form: Ovary, Fallopian Tube, and Primary Peritoneal Carcinoma, AJCC 8th Edition - Clinical: cT3, cN0, cM0 - Signed by [name] Lark, MD on 11/29/2018   12/03/2018 Procedure   Successful ultrasound-guided therapeutic paracentesis yielding 3.7 liters of peritoneal fluid.     12/06/2018 Procedure   Placement of a subcutaneous port device. Catheter tip at the SVC and right atrium junction   12/14/2018 Procedure   Successful ultrasound-guided paracentesis yielding 2.9 L of peritoneal fluid   12/28/2018 Tumor Marker   Patient's tumor was tested for the following markers: CA-125 Results of  the tumor marker test revealed 812.    12/28/2018 - 04/22/2019 Chemotherapy   The patient had carboplatin and taxol for neoadjuvant treatment, followed by interval debulking surgery and subsequent adjuvant chemotherapy treatment.     02/01/2019 Imaging   Ct abdomen and pelvis 8.6 cm left ovarian mass, corresponding to the patient's known primary neoplasm, improved.   Mild peritoneal nodularity/omental caking, improved.   Small abdominopelvic ascites, improved.     02/01/2019 Tumor Marker   Patient's tumor was tested for the following markers: CA-125 Results of the tumor marker test revealed 183.   02/12/2019 Surgery   Preoperative Diagnosis: Stage IIIC ovarian cancer, s/p neoadjuvant chemotherapy      Procedure(s) Performed: 1. Exploratory laparotomy with total abdominal hysterectomy, bilateral salpingo-oophorectomy, omentectomy radical tumor debulking for ovarian cancer.   Surgeon: Thereasa Solo, MD.    Operative Findings: upper abdomen free of disease. No visible omental disease. Small volume (200cc) ascites. 8cm friable mass replacing left ovary and adherent to the sigmoid colon mesentery and ureter on the left.  Anterior fibroid. This represented an optimal cytoreduction (R0) with no gross visible disease remaining.    02/12/2019 Pathology Results   A. OVARY AND FALLOPIAN TUBE, LEFT, SALPINGO-OOPHORECTOMY: - Serous carcinoma, high grade, status post neoadjuvant therapy - See oncology table and comment below B. UTERUS CERVIX WITH RIGHT FALLOPIAN TUBE AND OVARY, HYSTERECTOMY: Uterus: - Serosal surface involved by serous carcinoma - Endomyometrium uninvolved by carcinoma - Benign endometrial polyp (4.1 cm) - Leiomyomata (5.5 cm; largest) - Adenomyosis Cervix: - Uninvolved by carcinoma Left ovary: - Serous carcinoma, high grade Left fallopian tube: - Serous carcinoma, high grade C. SOFT TISSUE, LEFT PELVIC SIDEWALL TUMOR, EXCISION: - Metastatic serous carcinoma, high-grade D. SOFT TISSUE, SIGMOID COLON MESENTERY,  EXCISION: - Metastatic serous carcinoma, high-grade E. OMENTUM, TUMOR RESECTION: - Metastatic serous carcinoma, high-grade OVARY or FALLOPIAN TUBE or PRIMARY PERITONEUM: Procedure: Total hysterectomy and bilateral salpingo-oophorectomy, omentectomy and peritoneal biopsies Specimen Integrity: Fragmented Tumor Site: Left ovary and fallopian tube Ovarian Surface Involvement: Present Fallopian Tube Surface Involvement: Present Tumor Size: 6.3 cm (see comment) Histologic Type: Serous carcinoma Histologic Grade: High-grade Implants: Not applicable Other Tissue/ Organ Involvement: Right ovary, right fallopian tube, omentum, mesentery Largest Extrapelvic Peritoneal Focus: Macroscopic Peritoneal/Ascitic Fluid: Malignant Treatment Effect: No definite or minimal response identified (chemotherapy response score 1 [CRS1] Regional Lymph Nodes: No lymph nodes submitted or found Pathologic Stage Classification (pTNM, AJCC 8th Edition): pT3c, pNX Representative Tumor Block: A4 Comment(s): Additional testing (HER-2, MMR and MSI) are pending. The primary tumor site appears to be the left ovary and fallopian tube. The uterus is only involved on the serosal surface. There is tumor on the anterior peritoneal reflection.  Addendum: Tumor is Her2 negative,MSI stable   02/19/2019 Tumor Marker   Patient's tumor was tested for the following markers: CA-125 Results of the tumor marker test revealed 40.3   04/01/2019 Tumor Marker   Patient's tumor was tested for the following markers: CA-125 Results of the tumor marker test revealed 8.1    Genetic Testing   Negative testing. No pathogenic variants identified on the [employer]. The report date is 04/17/2019.  Somatic genes analyzed through [facility]: ATM, BARD1, BRCA1, BRCA2, BRIP1, CHEK2, MRE11A, NBN, PALB2, RAD51C, RAD51D.  The CancerNext gene panel offered by [employer] includes sequencing and rearrangement analysis for the  following 36 genes: APC*, ATM*, AXIN2, BARD1, BMPR1A, BRCA1*, BRCA2*, BRIP1*, CDH1*, CDK4, CDKN2A, CHEK2*, DICER1, MLH1*, MSH2*, MSH3, MSH6*, MUTYH*, NBN, NF1*, NTHL1, PALB2*, PMS2*, PTEN*,  RAD51C*, RAD51D*, RECQL, SMAD4, SMARCA4, STK11 and TP53* (sequencing and deletion/duplication); HOXB13, POLD1 and POLE (sequencing only); EPCAM and GREM1 (deletion/duplication only).    05/21/2019 Imaging   1. Interval hysterectomy, oophorectomy and omentectomy. No evidence of residual ovarian carcinoma in the pelvis. No evidence of peritoneal disease. No intraperitoneal free fluid. 2. Nonobstructing LEFT renal calculi.  Normal ureters.   05/22/2019 Tumor Marker   Patient's tumor was tested for the following markers: CA-125 Results of the tumor marker test revealed 5.7.   06/03/2019 Procedure   Successful right IJ vein Port-A-Cath explant.   08/26/2019 Tumor Marker   Patient's tumor was tested for the following markers: CA-125. Results of the tumor marker test revealed 4.9   10/03/2019 Imaging   1. New tumor deposits along the capsular surfaces of the liver, spleen, in the left pelvis, and right paracolic gutter, compatible with metastatic disease. 2. Mild dilation of the dorsal pancreatic duct in the pancreatic body and head, cause uncertain. 3. Nonobstructive left nephrolithiasis. 4. Aortic atherosclerosis.   Aortic Atherosclerosis (ICD10-I70.0   10/10/2019 Procedure   Successful placement of a right IJ approach Power Port with ultrasound and fluoroscopic guidance. The catheter is ready for use.     10/11/2019 Tumor Marker   Patient's tumor was tested for the following markers: CA-125 Results of the tumor marker test revealed 19.6.   10/14/2019 -  Chemotherapy   The patient had carboplatin and gemzar for chemotherapy treatment.     11/04/2019 Tumor Marker   Patient's tumor was tested for the following markers: CA-125 Results of the tumor marker test revealed 11.4   11/28/2019 Tumor Marker    Patient's tumor was tested for the following markers: CA-125 Results of the tumor marker test revealed 5.8   12/20/2019 Imaging   1. Significant interval reduction in mixed solid and cystic nodule in the vicinity of the left ovary, with significant improvement or resolution of multiple pelvic, peritoneal, and organ capsule implants. Resolution of previously seen small volume ascites. Findings are consistent with treatment response of abdominal metastatic disease. 2. Status post hysterectomy, oophorectomy, and omentectomy. 3. Nonobstructive left nephrolithiasis. 4. Aortic Atherosclerosis (ICD10-I70.0).   12/23/2019 Tumor Marker   Patient's tumor was tested for the following markers: CA-125 Results of the tumor marker test revealed 5.0   01/24/2020 Tumor Marker   Patient's tumor was tested for the following markers: CA-125 Results of the tumor marker test revealed 4.8   02/24/2020 Tumor Marker   Patient's tumor was tested for the following markers: CA-125. Results of the tumor marker test revealed 4.3.   03/06/2020 Imaging   1. Status post hysterectomy, bilateral oophorectomy, and omentectomy. 2. Resolution of previously described left adnexal nodularity. 3. No residual disease identified. 4.  Aortic Atherosclerosis (ICD10-I70.0). 5. Left nephrolithiasis.   04/20/2020 Tumor Marker   Patient's tumor was tested for the following markers: CA-125 Results of the tumor marker test revealed 4.6   05/18/2020 Tumor Marker   Patient's tumor was tested for the following markers: CA-125 Results of the tumor marker test revealed 4.7   05/29/2020 Imaging   Status post hysterectomy and bilateral salpingo oophorectomy.   No evidence of recurrent or metastatic disease.   06/08/2020 - 06/08/2020 Chemotherapy   She is taking niraparib       06/17/2020 Tumor Marker   Patient's tumor was tested for the following markers: CA-125 Results of the tumor marker test revealed 4.5     REVIEW OF SYSTEMS:    Constitutional: Denies fevers, chills or abnormal  weight loss Eyes: Denies blurriness of vision Ears, nose, mouth, throat, and face: Denies mucositis or sore throat Respiratory: Denies cough, dyspnea or wheezes Cardiovascular: Denies palpitation, chest discomfort Gastrointestinal:  Denies nausea, heartburn or change in bowel habits Lymphatics: Denies new lymphadenopathy or easy bruising Neurological:Denies numbness, tingling or new weaknesses Behavioral/Psych: Mood is stable, no new changes  Extremities: No lower extremity edema All other systems were reviewed with the patient and are negative.  I have reviewed the past medical history, past surgical history, social history and family history with the patient and they are unchanged from previous note.  ALLERGIES:  is allergic to skin adhesives [cyanoacrylate], shrimp [shellfish allergy], and sulfonamide derivatives.  MEDICATIONS:  Current Outpatient Medications  Medication Sig Dispense Refill  . ALPRAZolam (XANAX) 0.25 MG tablet Take 1 tablet (0.25 mg total) by mouth at bedtime as needed for anxiety. 30 tablet 2  . carboxymethylcellulose (REFRESH PLUS) 0.5 % SOLN Place 1 drop into both eyes 2 (two) times daily as needed (dry eyes).    . Cholecalciferol (VITAMIN D3) 25 MCG (1000 UT) CAPS Take 1,000 Units by mouth daily.     [name] Kitchen esomeprazole (NEXIUM) 20 MG capsule Take 20 mg by mouth daily.     [name] Kitchen estradiol (ESTRACE VAGINAL) 0.1 MG/GM vaginal cream Place 1 Applicatorful vaginally 3 (three) times a week. 42.5 g 12  . lidocaine-prilocaine (EMLA) cream Apply 1 application topically as needed. 30 g 11  . loratadine (CLARITIN) 10 MG tablet Take 10 mg by mouth daily as needed for allergies.    . Multiple Vitamin (MULTIVITAMIN WITH MINERALS) TABS tablet Take 1 tablet by mouth daily.    . niraparib tosylate (ZEJULA) 100 MG capsule Take 2 capsules (200 mg total) by mouth daily. May take at bedtime to reduce nausea and vomiting. 60 capsule 11  .  ondansetron (ZOFRAN) 8 MG tablet Take 1 tablet (8 mg total) by mouth every 8 (eight) hours as needed. 30 tablet 1  . prochlorperazine (COMPAZINE) 10 MG tablet Take 1 tablet (10 mg total) by mouth every 6 (six) hours as needed for nausea or vomiting. 30 tablet 2   No current facility-administered medications for this visit.    PHYSICAL EXAMINATION: ECOG PERFORMANCE STATUS: 1 - Symptomatic but completely ambulatory  LABORATORY DATA:  I have reviewed the data as listed CMP Latest Ref Rng & Units 06/30/2020 06/24/2020 06/17/2020  Glucose 70 - 99 mg/dL 94 106(H) 70  BUN 8 - 23 mg/dL $Remove'10 10 11  'gdcgThT$ Creatinine 0.44 - 1.00 mg/dL 0.72 0.75 0.70  Sodium 135 - 145 mmol/L 138 141 139  Potassium 3.5 - 5.1 mmol/L 3.8 3.8 4.0  Chloride 98 - 111 mmol/L 104 105 105  CO2 22 - 32 mmol/L $RemoveB'26 29 26  'rlHqrSAH$ Calcium 8.9 - 10.3 mg/dL 9.1 9.5 9.6  Total Protein 6.5 - 8.1 g/dL 6.8 7.3 6.9  Total Bilirubin 0.3 - 1.2 mg/dL 2.2(H) 1.9(H) 2.2(H)  Alkaline Phos 38 - 126 U/L 74 80 75  AST 15 - 41 U/L $Remo'31 31 29  'UshLR$ ALT 0 - 44 U/L $Remo'23 23 21    'HAvxR$ Lab Results  Component Value Date   WBC 4.7 06/30/2020   HGB 13.7 06/30/2020   HCT 40.1 06/30/2020   MCV 97.8 06/30/2020   PLT 145 (L) 06/30/2020   NEUTROABS 3.1 06/30/2020      I discussed the assessment and treatment plan with the patient. The patient was provided an opportunity to ask questions and all were answered. The patient agreed with the  plan and demonstrated an understanding of the instructions. The patient was advised to call back or seek an in-person evaluation if the symptoms worsen or if the condition fails to improve as anticipated.    I spent 20 minutes for the appointment reviewing test results, discuss management and coordination of care.  [name] Lark, MD 06/30/2020 1:46 PM

## 2020-06-30 NOTE — Assessment & Plan Note (Signed)
So far, she tolerated treatment well except for mild thrombocytopenia, anxiety and slight reflux Plan to see her again in 2 weeks for further follow-up She will continue current dose for now

## 2020-06-30 NOTE — Assessment & Plan Note (Signed)
This is due to her treatment She has no bleeding complications Observe closely for now

## 2020-06-30 NOTE — Assessment & Plan Note (Signed)
She has mild skin rash on her feet It is not typical for drug related reaction Observe only for now She can take over-the-counter steroid cream if needed

## 2020-07-06 MED FILL — ZEJULA 100 MG CAPS: 100 | 30 days supply | Qty: 60 | Fill #1

## 2020-07-08 ENCOUNTER — Inpatient Hospital Stay: Payer: Managed Care, Other (non HMO)

## 2020-07-08 ENCOUNTER — Encounter: Payer: Self-pay | Admitting: Hematology and Oncology

## 2020-07-08 ENCOUNTER — Telehealth: Payer: Self-pay | Admitting: Hematology and Oncology

## 2020-07-08 ENCOUNTER — Other Ambulatory Visit: Payer: Self-pay

## 2020-07-08 ENCOUNTER — Inpatient Hospital Stay (HOSPITAL_BASED_OUTPATIENT_CLINIC_OR_DEPARTMENT_OTHER): Payer: Managed Care, Other (non HMO) | Admitting: Hematology and Oncology

## 2020-07-08 DIAGNOSIS — F411 Generalized anxiety disorder: Secondary | ICD-10-CM

## 2020-07-08 DIAGNOSIS — R748 Abnormal levels of other serum enzymes: Secondary | ICD-10-CM

## 2020-07-08 DIAGNOSIS — C562 Malignant neoplasm of left ovary: Secondary | ICD-10-CM

## 2020-07-08 LAB — CBC WITH DIFFERENTIAL/PLATELET
Abs Immature Granulocytes: 0 10*3/uL (ref 0.00–0.07)
Basophils Absolute: 0 10*3/uL (ref 0.0–0.1)
Basophils Relative: 1 %
Eosinophils Absolute: 0.1 10*3/uL (ref 0.0–0.5)
Eosinophils Relative: 2 %
HCT: 40 % (ref 36.0–46.0)
Hemoglobin: 13.6 g/dL (ref 12.0–15.0)
Immature Granulocytes: 0 %
Lymphocytes Relative: 30 %
Lymphs Abs: 1.3 10*3/uL (ref 0.7–4.0)
MCH: 33.3 pg (ref 26.0–34.0)
MCHC: 34 g/dL (ref 30.0–36.0)
MCV: 98 fL (ref 80.0–100.0)
Monocytes Absolute: 0.4 10*3/uL (ref 0.1–1.0)
Monocytes Relative: 9 %
Neutro Abs: 2.6 10*3/uL (ref 1.7–7.7)
Neutrophils Relative %: 58 %
Platelets: 179 10*3/uL (ref 150–400)
RBC: 4.08 MIL/uL (ref 3.87–5.11)
RDW: 12.9 % (ref 11.5–15.5)
WBC: 4.5 10*3/uL (ref 4.0–10.5)
nRBC: 0 % (ref 0.0–0.2)

## 2020-07-08 LAB — COMPREHENSIVE METABOLIC PANEL
ALT: 26 U/L (ref 0–44)
AST: 35 U/L (ref 15–41)
Albumin: 4.3 g/dL (ref 3.5–5.0)
Alkaline Phosphatase: 85 U/L (ref 38–126)
Anion gap: 8 (ref 5–15)
BUN: 12 mg/dL (ref 8–23)
CO2: 26 mmol/L (ref 22–32)
Calcium: 9.3 mg/dL (ref 8.9–10.3)
Chloride: 105 mmol/L (ref 98–111)
Creatinine, Ser: 0.77 mg/dL (ref 0.44–1.00)
GFR, Estimated: >60 mL/min (ref >60)
Glucose, Bld: 109 mg/dL — ABNORMAL HIGH (ref 70–99)
Potassium: 3.8 mmol/L (ref 3.5–5.1)
Sodium: 139 mmol/L (ref 135–145)
Total Bilirubin: 1.4 mg/dL — ABNORMAL HIGH (ref 0.3–1.2)
Total Protein: 7.2 g/dL (ref 6.5–8.1)

## 2020-07-08 NOTE — Telephone Encounter (Signed)
Scheduled appts per 2/16 sch msg. Gave pt a printout of appt calendar

## 2020-07-08 NOTE — Assessment & Plan Note (Signed)
She tolerated treatment better Anxiety is less She will continue Xanax as needed

## 2020-07-08 NOTE — Assessment & Plan Note (Signed)
So far, she tolerated treatment well except for mild anxiety and slight reflux Plan to see her again in 4 weeks for further follow-up She will continue current dose for now

## 2020-07-08 NOTE — Progress Notes (Signed)
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) So far, she tolerated treatment well except for mild anxiety and slight reflux Plan to see her again in 4 weeks for further follow-up She will continue current dose for now  Anxiety state She tolerated treatment better Anxiety is less She will continue Xanax as needed  Elevated liver enzymes She has chronic elevated bilirubin Observe closely for now   No orders of the defined types were placed in this encounter.   All questions were answered. The patient knows to call the clinic with any problems, questions or concerns. The total time spent in the appointment was 20 minutes encounter with patients including review of chart and various tests results, discussions about plan of care and coordination of care plan   [name] Lark, MD 07/08/2020 1:59 PM  INTERVAL HISTORY: Please see below for problem oriented charting. 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 14.7 cm poorly defined soft tissue mass in central pelvis suspicious for primary ovarian carcinoma, with uterine leiomyosarcoma considered a less likely differential diagnosis. 2. Moderate ascites and diffuse peritoneal carcinomatosis. 3. Several small uterine fibroids.   11/13/2018 Tumor Marker   Patient's tumor was tested for the following markers: CA-125 Results of the  tumor marker test revealed 1434   11/21/2018 Procedure   Successful ultrasound-guided diagnostic and therapeutic paracentesis yielding 3.1 liters of peritoneal fluid   11/21/2018 Pathology Results   PERITONEAL/ASCITIC FLUID(SPECIMEN 1 OF 1 COLLECTED 11/21/18): ADENOCARCINOMA. Specimen Clinical Information Pelvic mass suspicious for ovarian cancer Source Peritoneal/Ascitic Fluid, (specimen 1 of 1 collected 11/21/18) Gross Specimen: Received is/are 1000 ccs of dark amber fluid. (CM:cm) Prepared: # Smears: 0 # Concentration Technique Slides (i.e. 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   11/29/2018 Initial Diagnosis   Ovarian cancer ([place])   11/29/2018 Cancer Staging   Staging form: Ovary, Fallopian Tube, and Primary Peritoneal Carcinoma, AJCC 8th Edition - Clinical: cT3, cN0, cM0 - Signed by [name] Lark, MD on 11/29/2018   12/03/2018 Procedure   Successful ultrasound-guided therapeutic paracentesis yielding 3.7 liters of peritoneal fluid.     12/06/2018 Procedure   Placement of a subcutaneous port device. Catheter tip at the SVC and right atrium junction   12/14/2018 Procedure   Successful ultrasound-guided paracentesis yielding 2.9 L of peritoneal fluid   12/28/2018 Tumor Marker   Patient's tumor was tested for the following markers: CA-125 Results of the tumor marker test revealed 812.   12/28/2018 - 04/22/2019 Chemotherapy   The patient had carboplatin and taxol for neoadjuvant treatment, followed by interval debulking surgery and subsequent adjuvant chemotherapy treatment.     02/01/2019 Imaging   Ct abdomen and pelvis 8.6 cm left ovarian mass, corresponding to the patient's known primary neoplasm, improved.   Mild peritoneal nodularity/omental caking, improved.   Small abdominopelvic ascites, improved.     02/01/2019 Tumor Marker  Patient's tumor was tested for the following markers:  CA-125 Results of the tumor marker test revealed 183.   02/12/2019 Surgery   Preoperative Diagnosis: Stage IIIC ovarian cancer, s/p neoadjuvant chemotherapy      Procedure(s) Performed: 1. Exploratory laparotomy with total abdominal hysterectomy, bilateral salpingo-oophorectomy, omentectomy radical tumor debulking for ovarian cancer.   Surgeon: Thereasa Solo, MD.    Operative Findings: upper abdomen free of disease. No visible omental disease. Small volume (200cc) ascites. 8cm friable mass replacing left ovary and adherent to the sigmoid colon mesentery and ureter on the left.  Anterior fibroid. This represented an optimal cytoreduction (R0) with no gross visible disease remaining.    02/12/2019 Pathology Results   A. OVARY AND FALLOPIAN TUBE, LEFT, SALPINGO-OOPHORECTOMY: - Serous carcinoma, high grade, status post neoadjuvant therapy - See oncology table and comment below B. UTERUS CERVIX WITH RIGHT FALLOPIAN TUBE AND OVARY, HYSTERECTOMY: Uterus: - Serosal surface involved by serous carcinoma - Endomyometrium uninvolved by carcinoma - Benign endometrial polyp (4.1 cm) - Leiomyomata (5.5 cm; largest) - Adenomyosis Cervix: - Uninvolved by carcinoma Left ovary: - Serous carcinoma, high grade Left fallopian tube: - Serous carcinoma, high grade C. SOFT TISSUE, LEFT PELVIC SIDEWALL TUMOR, EXCISION: - Metastatic serous carcinoma, high-grade D. SOFT TISSUE, SIGMOID COLON MESENTERY, EXCISION: - Metastatic serous carcinoma, high-grade E. OMENTUM, TUMOR RESECTION: - Metastatic serous carcinoma, high-grade OVARY or FALLOPIAN TUBE or PRIMARY PERITONEUM: Procedure: Total hysterectomy and bilateral salpingo-oophorectomy, omentectomy and peritoneal biopsies Specimen Integrity: Fragmented Tumor Site: Left ovary and fallopian tube Ovarian Surface Involvement: Present Fallopian Tube Surface Involvement: Present Tumor Size: 6.3 cm (see comment) Histologic Type: Serous carcinoma Histologic  Grade: High-grade Implants: Not applicable Other Tissue/ Organ Involvement: Right ovary, right fallopian tube, omentum, mesentery Largest Extrapelvic Peritoneal Focus: Macroscopic Peritoneal/Ascitic Fluid: Malignant Treatment Effect: No definite or minimal response identified (chemotherapy response score 1 [CRS1] Regional Lymph Nodes: No lymph nodes submitted or found Pathologic Stage Classification (pTNM, AJCC 8th Edition): pT3c, pNX Representative Tumor Block: A4 Comment(s): Additional testing (HER-2, MMR and MSI) are pending. The primary tumor site appears to be the left ovary and fallopian tube. The uterus is only involved on the serosal surface. There is tumor on the anterior peritoneal reflection.  Addendum: Tumor is Her2 negative,MSI stable   02/19/2019 Tumor Marker   Patient's tumor was tested for the following markers: CA-125 Results of the tumor marker test revealed 40.3   04/01/2019 Tumor Marker   Patient's tumor was tested for the following markers: CA-125 Results of the tumor marker test revealed 8.1    Genetic Testing   Negative testing. No pathogenic variants identified on the [employer]. The report date is 04/17/2019.  Somatic genes analyzed through [facility]: ATM, BARD1, BRCA1, BRCA2, BRIP1, CHEK2, MRE11A, NBN, PALB2, RAD51C, RAD51D.  The CancerNext gene panel offered by [employer] includes sequencing and rearrangement analysis for the following 36 genes: APC*, ATM*, AXIN2, BARD1, BMPR1A, BRCA1*, BRCA2*, BRIP1*, CDH1*, CDK4, CDKN2A, CHEK2*, DICER1, MLH1*, MSH2*, MSH3, MSH6*, MUTYH*, NBN, NF1*, NTHL1, PALB2*, PMS2*, PTEN*, RAD51C*, RAD51D*, RECQL, SMAD4, SMARCA4, STK11 and TP53* (sequencing and deletion/duplication); HOXB13, POLD1 and POLE (sequencing only); EPCAM and GREM1 (deletion/duplication only).    05/21/2019 Imaging   1. Interval hysterectomy, oophorectomy and omentectomy. No evidence of residual ovarian carcinoma in the pelvis. No  evidence of peritoneal disease. No intraperitoneal free fluid. 2. Nonobstructing LEFT renal calculi.  Normal ureters.   05/22/2019 Tumor Marker   Patient's tumor was tested for the following markers: CA-125 Results of the  tumor marker test revealed 5.7.   06/03/2019 Procedure   Successful right IJ vein Port-A-Cath explant.   08/26/2019 Tumor Marker   Patient's tumor was tested for the following markers: CA-125. Results of the tumor marker test revealed 4.9   10/03/2019 Imaging   1. New tumor deposits along the capsular surfaces of the liver, spleen, in the left pelvis, and right paracolic gutter, compatible with metastatic disease. 2. Mild dilation of the dorsal pancreatic duct in the pancreatic body and head, cause uncertain. 3. Nonobstructive left nephrolithiasis. 4. Aortic atherosclerosis.   Aortic Atherosclerosis (ICD10-I70.0   10/10/2019 Procedure   Successful placement of a right IJ approach Power Port with ultrasound and fluoroscopic guidance. The catheter is ready for use.     10/11/2019 Tumor Marker   Patient's tumor was tested for the following markers: CA-125 Results of the tumor marker test revealed 19.6.   10/14/2019 -  Chemotherapy   The patient had carboplatin and gemzar for chemotherapy treatment.     11/04/2019 Tumor Marker   Patient's tumor was tested for the following markers: CA-125 Results of the tumor marker test revealed 11.4   11/28/2019 Tumor Marker   Patient's tumor was tested for the following markers: CA-125 Results of the tumor marker test revealed 5.8   12/20/2019 Imaging   1. Significant interval reduction in mixed solid and cystic nodule in the vicinity of the left ovary, with significant improvement or resolution of multiple pelvic, peritoneal, and organ capsule implants. Resolution of previously seen small volume ascites. Findings are consistent with treatment response of abdominal metastatic disease. 2. Status post hysterectomy, oophorectomy, and  omentectomy. 3. Nonobstructive left nephrolithiasis. 4. Aortic Atherosclerosis (ICD10-I70.0).   12/23/2019 Tumor Marker   Patient's tumor was tested for the following markers: CA-125 Results of the tumor marker test revealed 5.0   01/24/2020 Tumor Marker   Patient's tumor was tested for the following markers: CA-125 Results of the tumor marker test revealed 4.8   02/24/2020 Tumor Marker   Patient's tumor was tested for the following markers: CA-125. Results of the tumor marker test revealed 4.3.   03/06/2020 Imaging   1. Status post hysterectomy, bilateral oophorectomy, and omentectomy. 2. Resolution of previously described left adnexal nodularity. 3. No residual disease identified. 4.  Aortic Atherosclerosis (ICD10-I70.0). 5. Left nephrolithiasis.   04/20/2020 Tumor Marker   Patient's tumor was tested for the following markers: CA-125 Results of the tumor marker test revealed 4.6   05/18/2020 Tumor Marker   Patient's tumor was tested for the following markers: CA-125 Results of the tumor marker test revealed 4.7   05/29/2020 Imaging   Status post hysterectomy and bilateral salpingo oophorectomy.   No evidence of recurrent or metastatic disease.   06/08/2020 - 06/08/2020 Chemotherapy   She is taking niraparib       06/17/2020 Tumor Marker   Patient's tumor was tested for the following markers: CA-125 Results of the tumor marker test revealed 4.5     REVIEW OF SYSTEMS:   Constitutional: Denies fevers, chills or abnormal weight loss Eyes: Denies blurriness of vision Ears, nose, mouth, throat, and face: Denies mucositis or sore throat Respiratory: Denies cough, dyspnea or wheezes Cardiovascular: Denies palpitation, chest discomfort or lower extremity swelling Gastrointestinal:  Denies nausea, heartburn or change in bowel habits Skin: Denies abnormal skin rashes Lymphatics: Denies new lymphadenopathy or easy bruising Neurological:Denies numbness, tingling or new  weaknesses Behavioral/Psych: Mood is stable, no new changes  All other systems were reviewed with the patient and are negative.  I have reviewed the past medical history, past surgical history, social history and family history with the patient and they are unchanged from previous note.  ALLERGIES:  is allergic to skin adhesives [cyanoacrylate], shrimp [shellfish allergy], and sulfonamide derivatives.  MEDICATIONS:  Current Outpatient Medications  Medication Sig Dispense Refill  . ALPRAZolam (XANAX) 0.25 MG tablet Take 1 tablet (0.25 mg total) by mouth at bedtime as needed for anxiety. 30 tablet 2  . carboxymethylcellulose (REFRESH PLUS) 0.5 % SOLN Place 1 drop into both eyes 2 (two) times daily as needed (dry eyes).    . Cholecalciferol (VITAMIN D3) 25 MCG (1000 UT) CAPS Take 1,000 Units by mouth daily.     [name] Kitchen esomeprazole (NEXIUM) 20 MG capsule Take 20 mg by mouth daily.     [name] Kitchen estradiol (ESTRACE VAGINAL) 0.1 MG/GM vaginal cream Place 1 Applicatorful vaginally 3 (three) times a week. 42.5 g 12  . lidocaine-prilocaine (EMLA) cream Apply 1 application topically as needed. 30 g 11  . loratadine (CLARITIN) 10 MG tablet Take 10 mg by mouth daily as needed for allergies.    . Multiple Vitamin (MULTIVITAMIN WITH MINERALS) TABS tablet Take 1 tablet by mouth daily.    . niraparib tosylate (ZEJULA) 100 MG capsule Take 2 capsules (200 mg total) by mouth daily. May take at bedtime to reduce nausea and vomiting. 60 capsule 11  . ondansetron (ZOFRAN) 8 MG tablet Take 1 tablet (8 mg total) by mouth every 8 (eight) hours as needed. 30 tablet 1  . prochlorperazine (COMPAZINE) 10 MG tablet Take 1 tablet (10 mg total) by mouth every 6 (six) hours as needed for nausea or vomiting. 30 tablet 2   No current facility-administered medications for this visit.    PHYSICAL EXAMINATION: ECOG PERFORMANCE STATUS: 1 - Symptomatic but completely ambulatory  Vitals:   07/08/20 0944  BP: (!) 156/93  Pulse: (!) 106   Resp: 20  Temp: 97.7 F (36.5 C)  SpO2: 100%   Filed Weights   07/08/20 0944  Weight: 149 lb (67.6 kg)    GENERAL:alert, no distress and comfortable SKIN: skin color, texture, turgor are normal, no rashes or significant lesions EYES: normal, Conjunctiva are pink and non-injected, sclera clear OROPHARYNX:no exudate, no erythema and lips, buccal mucosa, and tongue normal  NECK: supple, thyroid normal size, non-tender, without nodularity LYMPH:  no palpable lymphadenopathy in the cervical, axillary or inguinal LUNGS: clear to auscultation and percussion with normal breathing effort HEART: regular rate & rhythm and no murmurs and no lower extremity edema ABDOMEN:abdomen soft, non-tender and normal bowel sounds Musculoskeletal:no cyanosis of digits and no clubbing  NEURO: alert & oriented x 3 with fluent speech, no focal motor/sensory deficits  LABORATORY DATA:  I have reviewed the data as listed    Component Value Date/Time   NA 139 07/08/2020 0916   K 3.8 07/08/2020 0916   CL 105 07/08/2020 0916   CO2 26 07/08/2020 0916   GLUCOSE 109 (H) 07/08/2020 0916   BUN 12 07/08/2020 0916   CREATININE 0.77 07/08/2020 0916   CREATININE 0.75 06/24/2020 1413   CALCIUM 9.3 07/08/2020 0916   PROT 7.2 07/08/2020 0916   ALBUMIN 4.3 07/08/2020 0916   AST 35 07/08/2020 0916   AST 31 06/24/2020 1413   ALT 26 07/08/2020 0916   ALT 23 06/24/2020 1413   ALKPHOS 85 07/08/2020 0916   BILITOT 1.4 (H) 07/08/2020 0916   BILITOT 1.9 (H) 06/24/2020 1413   GFRNONAA >60 07/08/2020 0916   GFRNONAA >60 06/24/2020  [place] 02/24/2020 0852    No results found for: SPEP, UPEP  Lab Results  Component Value Date   WBC 4.5 07/08/2020   NEUTROABS 2.6 07/08/2020   HGB 13.6 07/08/2020   HCT 40.0 07/08/2020   MCV 98.0 07/08/2020   PLT 179 07/08/2020      Chemistry      Component Value Date/Time   NA 139 07/08/2020 0916   K 3.8 07/08/2020 0916   CL 105 07/08/2020 0916   CO2 26 07/08/2020  0916   BUN 12 07/08/2020 0916   CREATININE 0.77 07/08/2020 0916   CREATININE 0.75 06/24/2020 1413      Component Value Date/Time   CALCIUM 9.3 07/08/2020 0916   ALKPHOS 85 07/08/2020 0916   AST 35 07/08/2020 0916   AST 31 06/24/2020 1413   ALT 26 07/08/2020 0916   ALT 23 06/24/2020 1413   BILITOT 1.4 (H) 07/08/2020 0916   BILITOT 1.9 (H) 06/24/2020 1413

## 2020-07-08 NOTE — Assessment & Plan Note (Signed)
She has chronic elevated bilirubin Observe closely for now

## 2020-08-04 ENCOUNTER — Inpatient Hospital Stay: Payer: Managed Care, Other (non HMO)

## 2020-08-04 ENCOUNTER — Inpatient Hospital Stay: Payer: Managed Care, Other (non HMO) | Attending: Gynecologic Oncology | Admitting: Hematology and Oncology

## 2020-08-04 ENCOUNTER — Other Ambulatory Visit: Payer: Self-pay

## 2020-08-04 ENCOUNTER — Telehealth: Payer: Self-pay | Admitting: Hematology and Oncology

## 2020-08-04 ENCOUNTER — Encounter: Payer: Self-pay | Admitting: Hematology and Oncology

## 2020-08-04 DIAGNOSIS — F411 Generalized anxiety disorder: Secondary | ICD-10-CM

## 2020-08-04 DIAGNOSIS — C562 Malignant neoplasm of left ovary: Secondary | ICD-10-CM | POA: Diagnosis not present

## 2020-08-04 DIAGNOSIS — K1231 Oral mucositis (ulcerative) due to antineoplastic therapy: Secondary | ICD-10-CM | POA: Diagnosis not present

## 2020-08-04 DIAGNOSIS — Z7189 Other specified counseling: Secondary | ICD-10-CM

## 2020-08-04 DIAGNOSIS — C786 Secondary malignant neoplasm of retroperitoneum and peritoneum: Secondary | ICD-10-CM

## 2020-08-04 DIAGNOSIS — T451X5A Adverse effect of antineoplastic and immunosuppressive drugs, initial encounter: Secondary | ICD-10-CM | POA: Diagnosis not present

## 2020-08-04 DIAGNOSIS — R630 Anorexia: Secondary | ICD-10-CM | POA: Diagnosis not present

## 2020-08-04 LAB — CBC WITH DIFFERENTIAL/PLATELET
Abs Immature Granulocytes: 0.01 10*3/uL (ref 0.00–0.07)
Basophils Absolute: 0 10*3/uL (ref 0.0–0.1)
Basophils Relative: 1 %
Eosinophils Absolute: 0.1 10*3/uL (ref 0.0–0.5)
Eosinophils Relative: 2 %
HCT: 34.7 % — ABNORMAL LOW (ref 36.0–46.0)
Hemoglobin: 12.2 g/dL (ref 12.0–15.0)
Immature Granulocytes: 0 %
Lymphocytes Relative: 27 %
Lymphs Abs: 1.1 10*3/uL (ref 0.7–4.0)
MCH: 33.2 pg (ref 26.0–34.0)
MCHC: 35.2 g/dL (ref 30.0–36.0)
MCV: 94.3 fL (ref 80.0–100.0)
Monocytes Absolute: 0.5 10*3/uL (ref 0.1–1.0)
Monocytes Relative: 11 %
Neutro Abs: 2.5 10*3/uL (ref 1.7–7.7)
Neutrophils Relative %: 59 %
Platelets: 188 10*3/uL (ref 150–400)
RBC: 3.68 MIL/uL — ABNORMAL LOW (ref 3.87–5.11)
RDW: 13.6 % (ref 11.5–15.5)
WBC: 4.2 10*3/uL (ref 4.0–10.5)
nRBC: 0 % (ref 0.0–0.2)

## 2020-08-04 LAB — COMPREHENSIVE METABOLIC PANEL
ALT: 29 U/L (ref 0–44)
AST: 31 U/L (ref 15–41)
Albumin: 4 g/dL (ref 3.5–5.0)
Alkaline Phosphatase: 90 U/L (ref 38–126)
Anion gap: 10 (ref 5–15)
BUN: 10 mg/dL (ref 8–23)
CO2: 25 mmol/L (ref 22–32)
Calcium: 8.9 mg/dL (ref 8.9–10.3)
Chloride: 106 mmol/L (ref 98–111)
Creatinine, Ser: 0.69 mg/dL (ref 0.44–1.00)
GFR, Estimated: >60 mL/min (ref >60)
Glucose, Bld: 102 mg/dL — ABNORMAL HIGH (ref 70–99)
Potassium: 3.7 mmol/L (ref 3.5–5.1)
Sodium: 141 mmol/L (ref 135–145)
Total Bilirubin: 1.7 mg/dL — ABNORMAL HIGH (ref 0.3–1.2)
Total Protein: 6.6 g/dL (ref 6.5–8.1)

## 2020-08-04 MED ORDER — HEPARIN SOD (PORK) LOCK FLUSH 100 UNIT/ML IV SOLN
500.0000 [IU] | Freq: Once | INTRAVENOUS | Status: AC
  Administered 2020-08-04: 500 [IU]
  Filled 2020-08-04: qty 5

## 2020-08-04 MED ORDER — MAGIC MOUTHWASH W/LIDOCAINE: 5.0000 mL | Freq: Four times a day (QID) | ORAL | 0 refills | Status: DC

## 2020-08-04 MED ORDER — SODIUM CHLORIDE 0.9% FLUSH
10.0000 mL | Freq: Once | INTRAVENOUS | Status: AC
  Administered 2020-08-04: 10 mL
  Filled 2020-08-04: qty 10

## 2020-08-04 NOTE — Assessment & Plan Note (Signed)
Her treatment is complicated by mucositis and excessive fatigue I recommend she hold treatment for at least a week and return next week for me to check on her It is likely we have to reduce the dose in the future and for now I recommend her to hold off medication refills

## 2020-08-04 NOTE — Telephone Encounter (Signed)
Scheduled appointment per 3/15 sch msg. Spoke to patient who is aware of appointment date and time. Gave patient calendar print out.

## 2020-08-04 NOTE — Assessment & Plan Note (Signed)
She has anorexia likely due to side effects of treatment She will continue hydration and increase oral intake as tolerated

## 2020-08-04 NOTE — Progress Notes (Signed)
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[facility]) Her treatment is complicated by mucositis and excessive fatigue I recommend she hold treatment for at least a week and return next week for me to check on her It is likely we have to reduce the dose in the future and for now I recommend her to hold off medication refills  Mucositis due to antineoplastic therapy She had recent mucositis for about 2 weeks I suspect this is directly related to side effects of chemotherapy I recommend conservative approach with Magic mouthwash with lidocaine swish and spit I plan to reassess next week  Anorexia She has anorexia likely due to side effects of treatment She will continue hydration and increase oral intake as tolerated  Anxiety state She appears anxious and somewhat tearful Her blood pressure is elevated likely due to anxiety She will continue Xanax as needed for now   No orders of the defined types were placed in this encounter.   All questions were answered. The patient knows to call the clinic with any problems, questions or concerns. The total time spent in the appointment was 30 minutes encounter with patients including review of chart and various tests results, discussions about plan of care and coordination of care plan   [name] Lark, MD 08/04/2020 1:45 PM  INTERVAL HISTORY: Please see below for problem oriented charting. 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 14.7 cm poorly defined soft tissue mass in central pelvis suspicious for primary ovarian carcinoma, with uterine leiomyosarcoma considered a less likely differential diagnosis. 2. Moderate ascites and diffuse peritoneal carcinomatosis. 3. Several small uterine fibroids.   11/13/2018 Tumor Marker   Patient's tumor was tested for the following markers: CA-125 Results of the tumor marker test revealed 1434   11/21/2018 Procedure   Successful ultrasound-guided diagnostic and therapeutic paracentesis yielding 3.1 liters of peritoneal fluid   11/21/2018 Pathology Results   PERITONEAL/ASCITIC FLUID(SPECIMEN 1 OF 1 COLLECTED 11/21/18): ADENOCARCINOMA. Specimen Clinical Information Pelvic mass suspicious for ovarian cancer Source Peritoneal/Ascitic Fluid, (specimen 1 of 1 collected 11/21/18) Gross Specimen: Received is/are 1000 ccs of dark amber fluid. (CM:cm) Prepared: # Smears: 0 # Concentration Technique Slides (i.e. 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   11/29/2018 Initial Diagnosis   Ovarian cancer ([place])   11/29/2018 Cancer Staging   Staging form: Ovary, Fallopian Tube, and Primary Peritoneal Carcinoma, AJCC 8th Edition - Clinical: cT3, cN0, cM0 - Signed by [name] Lark, MD on 11/29/2018   12/03/2018 Procedure   Successful ultrasound-guided therapeutic paracentesis yielding 3.7 liters of peritoneal fluid.     12/06/2018 Procedure   Placement of a subcutaneous port device. Catheter tip at the SVC and right atrium junction   12/14/2018 Procedure   Successful ultrasound-guided paracentesis yielding 2.9 L of peritoneal fluid  12/28/2018 Tumor Marker   Patient's tumor was tested for the following markers: CA-125 Results of the tumor marker test revealed 812.    12/28/2018 - 04/22/2019 Chemotherapy   The patient had carboplatin and taxol for neoadjuvant treatment, followed by interval debulking surgery and subsequent adjuvant chemotherapy treatment.     02/01/2019 Imaging   Ct abdomen and pelvis 8.6 cm left ovarian mass, corresponding to the patient's known primary neoplasm, improved.   Mild peritoneal nodularity/omental caking, improved.   Small abdominopelvic ascites, improved.     02/01/2019 Tumor Marker   Patient's tumor was tested for the following markers: CA-125 Results of the tumor marker test revealed 183.   02/12/2019 Surgery   Preoperative Diagnosis: Stage IIIC ovarian cancer, s/p neoadjuvant chemotherapy      Procedure(s) Performed: 1. Exploratory laparotomy with total abdominal hysterectomy, bilateral salpingo-oophorectomy, omentectomy radical tumor debulking for ovarian cancer.   Surgeon: Thereasa Solo, MD.    Operative Findings: upper abdomen free of disease. No visible omental disease. Small volume (200cc) ascites. 8cm friable mass replacing left ovary and adherent to the sigmoid colon mesentery and ureter on the left.  Anterior fibroid. This represented an optimal cytoreduction (R0) with no gross visible disease remaining.    02/12/2019 Pathology Results   A. OVARY AND FALLOPIAN TUBE, LEFT, SALPINGO-OOPHORECTOMY: - Serous carcinoma, high grade, status post neoadjuvant therapy - See oncology table and comment below B. UTERUS CERVIX WITH RIGHT FALLOPIAN TUBE AND OVARY, HYSTERECTOMY: Uterus: - Serosal surface involved by serous carcinoma - Endomyometrium uninvolved by carcinoma - Benign endometrial polyp (4.1 cm) - Leiomyomata (5.5 cm; largest) - Adenomyosis Cervix: - Uninvolved by carcinoma Left ovary: - Serous carcinoma, high grade Left fallopian tube: - Serous carcinoma, high grade C. SOFT TISSUE, LEFT PELVIC SIDEWALL TUMOR, EXCISION: - Metastatic serous carcinoma, high-grade D. SOFT TISSUE, SIGMOID COLON MESENTERY,  EXCISION: - Metastatic serous carcinoma, high-grade E. OMENTUM, TUMOR RESECTION: - Metastatic serous carcinoma, high-grade OVARY or FALLOPIAN TUBE or PRIMARY PERITONEUM: Procedure: Total hysterectomy and bilateral salpingo-oophorectomy, omentectomy and peritoneal biopsies Specimen Integrity: Fragmented Tumor Site: Left ovary and fallopian tube Ovarian Surface Involvement: Present Fallopian Tube Surface Involvement: Present Tumor Size: 6.3 cm (see comment) Histologic Type: Serous carcinoma Histologic Grade: High-grade Implants: Not applicable Other Tissue/ Organ Involvement: Right ovary, right fallopian tube, omentum, mesentery Largest Extrapelvic Peritoneal Focus: Macroscopic Peritoneal/Ascitic Fluid: Malignant Treatment Effect: No definite or minimal response identified (chemotherapy response score 1 [CRS1] Regional Lymph Nodes: No lymph nodes submitted or found Pathologic Stage Classification (pTNM, AJCC 8th Edition): pT3c, pNX Representative Tumor Block: A4 Comment(s): Additional testing (HER-2, MMR and MSI) are pending. The primary tumor site appears to be the left ovary and fallopian tube. The uterus is only involved on the serosal surface. There is tumor on the anterior peritoneal reflection.  Addendum: Tumor is Her2 negative,MSI stable   02/19/2019 Tumor Marker   Patient's tumor was tested for the following markers: CA-125 Results of the tumor marker test revealed 40.3   04/01/2019 Tumor Marker   Patient's tumor was tested for the following markers: CA-125 Results of the tumor marker test revealed 8.1    Genetic Testing   Negative testing. No pathogenic variants identified on the [employer]. The report date is 04/17/2019.  Somatic genes analyzed through [facility]: ATM, BARD1, BRCA1, BRCA2, BRIP1, CHEK2, MRE11A, NBN, PALB2, RAD51C, RAD51D.  The [facility] gene panel offered by [facility] includes sequencing and rearrangement analysis for the  following 36 genes: APC*, ATM*, AXIN2, BARD1, BMPR1A, BRCA1*, BRCA2*, BRIP1*,  CDH1*, CDK4, CDKN2A, CHEK2*, DICER1, MLH1*, MSH2*, MSH3, MSH6*, MUTYH*, NBN, NF1*, NTHL1, PALB2*, PMS2*, PTEN*, RAD51C*, RAD51D*, RECQL, SMAD4, SMARCA4, STK11 and TP53* (sequencing and deletion/duplication); HOXB13, POLD1 and POLE (sequencing only); EPCAM and GREM1 (deletion/duplication only).    05/21/2019 Imaging   1. Interval hysterectomy, oophorectomy and omentectomy. No evidence of residual ovarian carcinoma in the pelvis. No evidence of peritoneal disease. No intraperitoneal free fluid. 2. Nonobstructing LEFT renal calculi.  Normal ureters.   05/22/2019 Tumor Marker   Patient's tumor was tested for the following markers: CA-125 Results of the tumor marker test revealed 5.7.   06/03/2019 Procedure   Successful right IJ vein Port-A-Cath explant.   08/26/2019 Tumor Marker   Patient's tumor was tested for the following markers: CA-125. Results of the tumor marker test revealed 4.9   10/03/2019 Imaging   1. New tumor deposits along the capsular surfaces of the liver, spleen, in the left pelvis, and right paracolic gutter, compatible with metastatic disease. 2. Mild dilation of the dorsal pancreatic duct in the pancreatic body and head, cause uncertain. 3. Nonobstructive left nephrolithiasis. 4. Aortic atherosclerosis.   Aortic Atherosclerosis (ICD10-I70.0   10/10/2019 Procedure   Successful placement of a right IJ approach Power Port with ultrasound and fluoroscopic guidance. The catheter is ready for use.     10/11/2019 Tumor Marker   Patient's tumor was tested for the following markers: CA-125 Results of the tumor marker test revealed 19.6.   10/14/2019 -  Chemotherapy   The patient had carboplatin and gemzar for chemotherapy treatment.     11/04/2019 Tumor Marker   Patient's tumor was tested for the following markers: CA-125 Results of the tumor marker test revealed 11.4   11/28/2019 Tumor Marker    Patient's tumor was tested for the following markers: CA-125 Results of the tumor marker test revealed 5.8   12/20/2019 Imaging   1. Significant interval reduction in mixed solid and cystic nodule in the vicinity of the left ovary, with significant improvement or resolution of multiple pelvic, peritoneal, and organ capsule implants. Resolution of previously seen small volume ascites. Findings are consistent with treatment response of abdominal metastatic disease. 2. Status post hysterectomy, oophorectomy, and omentectomy. 3. Nonobstructive left nephrolithiasis. 4. Aortic Atherosclerosis (ICD10-I70.0).   12/23/2019 Tumor Marker   Patient's tumor was tested for the following markers: CA-125 Results of the tumor marker test revealed 5.0   01/24/2020 Tumor Marker   Patient's tumor was tested for the following markers: CA-125 Results of the tumor marker test revealed 4.8   02/24/2020 Tumor Marker   Patient's tumor was tested for the following markers: CA-125. Results of the tumor marker test revealed 4.3.   03/06/2020 Imaging   1. Status post hysterectomy, bilateral oophorectomy, and omentectomy. 2. Resolution of previously described left adnexal nodularity. 3. No residual disease identified. 4.  Aortic Atherosclerosis (ICD10-I70.0). 5. Left nephrolithiasis.   04/20/2020 Tumor Marker   Patient's tumor was tested for the following markers: CA-125 Results of the tumor marker test revealed 4.6   05/18/2020 Tumor Marker   Patient's tumor was tested for the following markers: CA-125 Results of the tumor marker test revealed 4.7   05/29/2020 Imaging   Status post hysterectomy and bilateral salpingo oophorectomy.   No evidence of recurrent or metastatic disease.   06/08/2020 - 06/08/2020 Chemotherapy   She is taking niraparib       06/17/2020 Tumor Marker   Patient's tumor was tested for the following markers: CA-125 Results of the tumor marker test revealed 4.5  REVIEW OF SYSTEMS:    Constitutional: Denies fevers, chills or abnormal weight loss Eyes: Denies blurriness of vision Respiratory: Denies cough, dyspnea or wheezes Cardiovascular: Denies palpitation, chest discomfort or lower extremity swelling Gastrointestinal:  Denies nausea, heartburn or change in bowel habits Skin: Denies abnormal skin rashes Lymphatics: Denies new lymphadenopathy or easy bruising Neurological:Denies numbness, tingling or new weaknesses Behavioral/Psych: Mood is stable, no new changes  All other systems were reviewed with the patient and are negative.  I have reviewed the past medical history, past surgical history, social history and family history with the patient and they are unchanged from previous note.  ALLERGIES:  is allergic to skin adhesives [cyanoacrylate], shrimp [shellfish allergy], and sulfonamide derivatives.  MEDICATIONS:  Current Outpatient Medications  Medication Sig Dispense Refill  . ALPRAZolam (XANAX) 0.25 MG tablet Take 1 tablet (0.25 mg total) by mouth at bedtime as needed for anxiety. 30 tablet 2  . carboxymethylcellulose (REFRESH PLUS) 0.5 % SOLN Place 1 drop into both eyes 2 (two) times daily as needed (dry eyes).    . Cholecalciferol (VITAMIN D3) 25 MCG (1000 UT) CAPS Take 1,000 Units by mouth daily.     [name] Kitchen esomeprazole (NEXIUM) 20 MG capsule Take 20 mg by mouth daily.     [name] Kitchen estradiol (ESTRACE VAGINAL) 0.1 MG/GM vaginal cream Place 1 Applicatorful vaginally 3 (three) times a week. 42.5 g 12  . lidocaine-prilocaine (EMLA) cream Apply 1 application topically as needed. 30 g 11  . loratadine (CLARITIN) 10 MG tablet Take 10 mg by mouth daily as needed for allergies.    . magic mouthwash w/lidocaine SOLN Take 5 mLs by mouth 4 (four) times daily. 240 mL 0  . Multiple Vitamin (MULTIVITAMIN WITH MINERALS) TABS tablet Take 1 tablet by mouth daily.    . niraparib tosylate (ZEJULA) 100 MG capsule Take 2 capsules (200 mg total) by mouth daily. May take at bedtime to reduce  nausea and vomiting. 60 capsule 11  . ondansetron (ZOFRAN) 8 MG tablet Take 1 tablet (8 mg total) by mouth every 8 (eight) hours as needed. 30 tablet 1  . prochlorperazine (COMPAZINE) 10 MG tablet Take 1 tablet (10 mg total) by mouth every 6 (six) hours as needed for nausea or vomiting. 30 tablet 2   No current facility-administered medications for this visit.    PHYSICAL EXAMINATION: ECOG PERFORMANCE STATUS: 1 - Symptomatic but completely ambulatory  Vitals:   08/04/20 0810  BP: (!) 160/94  Pulse: 91  Resp: 18  Temp: 98.1 F (36.7 C)  SpO2: 100%   Filed Weights   08/04/20 0810  Weight: 149 lb 9.6 oz (67.9 kg)    GENERAL:alert, no distress and comfortable SKIN: skin color, texture, turgor are normal, no rashes or significant lesions EYES: normal, Conjunctiva are pink and non-injected, sclera clear OROPHARYNX noted mucositis.  No definitive signs of thrush NECK: supple, thyroid normal size, non-tender, without nodularity LYMPH:  no palpable lymphadenopathy in the cervical, axillary or inguinal LUNGS: clear to auscultation and percussion with normal breathing effort HEART: regular rate & rhythm and no murmurs and no lower extremity edema ABDOMEN:abdomen soft, non-tender and normal bowel sounds Musculoskeletal:no cyanosis of digits and no clubbing  NEURO: alert & oriented x 3 with fluent speech, no focal motor/sensory deficits.  She appears somewhat anxious  LABORATORY DATA:  I have reviewed the data as listed    Component Value Date/Time   NA 141 08/04/2020 0756   K 3.7 08/04/2020 0756   CL 106 08/04/2020 0756  CO2 25 08/04/2020 0756   GLUCOSE 102 (H) 08/04/2020 0756   BUN 10 08/04/2020 0756   CREATININE 0.69 08/04/2020 0756   CREATININE 0.75 06/24/2020 1413   CALCIUM 8.9 08/04/2020 0756   PROT 6.6 08/04/2020 0756   ALBUMIN 4.0 08/04/2020 0756   AST 31 08/04/2020 0756   AST 31 06/24/2020 1413   ALT 29 08/04/2020 0756   ALT 23 06/24/2020 1413   ALKPHOS 90  08/04/2020 0756   BILITOT 1.7 (H) 08/04/2020 0756   BILITOT 1.9 (H) 06/24/2020 1413   GFRNONAA >60 08/04/2020 0756   GFRNONAA >60 06/24/2020 1413   GFRAA >60 02/24/2020 0852    No results found for: SPEP, UPEP  Lab Results  Component Value Date   WBC 4.2 08/04/2020   NEUTROABS 2.5 08/04/2020   HGB 12.2 08/04/2020   HCT 34.7 (L) 08/04/2020   MCV 94.3 08/04/2020   PLT 188 08/04/2020      Chemistry      Component Value Date/Time   NA 141 08/04/2020 0756   K 3.7 08/04/2020 0756   CL 106 08/04/2020 0756   CO2 25 08/04/2020 0756   BUN 10 08/04/2020 0756   CREATININE 0.69 08/04/2020 0756   CREATININE 0.75 06/24/2020 1413      Component Value Date/Time   CALCIUM 8.9 08/04/2020 0756   ALKPHOS 90 08/04/2020 0756   AST 31 08/04/2020 0756   AST 31 06/24/2020 1413   ALT 29 08/04/2020 0756   ALT 23 06/24/2020 1413   BILITOT 1.7 (H) 08/04/2020 0756   BILITOT 1.9 (H) 06/24/2020 1413

## 2020-08-04 NOTE — Assessment & Plan Note (Signed)
She had recent mucositis for about 2 weeks I suspect this is directly related to side effects of chemotherapy I recommend conservative approach with Magic mouthwash with lidocaine swish and spit I plan to reassess next week

## 2020-08-04 NOTE — Assessment & Plan Note (Signed)
She appears anxious and somewhat tearful Her blood pressure is elevated likely due to anxiety She will continue Xanax as needed for now

## 2020-08-05 ENCOUNTER — Telehealth: Payer: Self-pay

## 2020-08-05 LAB — CA 125: Cancer Antigen (CA) 125: 5.7 U/mL (ref 0.0–38.1)

## 2020-08-05 NOTE — Telephone Encounter (Signed)
Pt notified. No further needs.

## 2020-08-05 NOTE — Telephone Encounter (Signed)
-----   Message from [name] Lark, MD sent at 08/05/2020  3:07 PM EDT ----- Pls let her know CA-125 is normal

## 2020-08-12 ENCOUNTER — Inpatient Hospital Stay (HOSPITAL_BASED_OUTPATIENT_CLINIC_OR_DEPARTMENT_OTHER): Payer: Managed Care, Other (non HMO) | Admitting: Hematology and Oncology

## 2020-08-12 ENCOUNTER — Other Ambulatory Visit: Payer: Self-pay | Admitting: Hematology and Oncology

## 2020-08-12 ENCOUNTER — Encounter: Payer: Self-pay | Admitting: Hematology and Oncology

## 2020-08-12 ENCOUNTER — Other Ambulatory Visit: Payer: Self-pay

## 2020-08-12 VITALS — BP 165/90 | HR 95 | Temp 98.0°F | Resp 18 | Ht 62.0 in | Wt 151.2 lb

## 2020-08-12 DIAGNOSIS — R630 Anorexia: Secondary | ICD-10-CM | POA: Diagnosis not present

## 2020-08-12 DIAGNOSIS — C562 Malignant neoplasm of left ovary: Secondary | ICD-10-CM | POA: Diagnosis not present

## 2020-08-12 DIAGNOSIS — F411 Generalized anxiety disorder: Secondary | ICD-10-CM | POA: Diagnosis not present

## 2020-08-12 DIAGNOSIS — K1231 Oral mucositis (ulcerative) due to antineoplastic therapy: Secondary | ICD-10-CM | POA: Diagnosis not present

## 2020-08-12 MED ORDER — NIRAPARIB TOSYLATE 100 MG PO CAPS: 100.0000 mg | ORAL_CAPSULE | Freq: Every day | ORAL | 11 refills | Status: DC

## 2020-08-12 NOTE — Assessment & Plan Note (Signed)
Her mucositis, fatigue and anorexia has improved since her treatment break We will resume treatment with reduced dose at 100 mg of niraparib I recommend repeat CT imaging next month for further follow-up and objective assessment of response to therapy She is in agreement

## 2020-08-12 NOTE — Assessment & Plan Note (Signed)
This is improved and she is gaining weight Observe closely

## 2020-08-12 NOTE — Assessment & Plan Note (Signed)
She appears anxious  Her blood pressure is elevated likely due to anxiety She will continue Xanax as needed for now

## 2020-08-12 NOTE — Assessment & Plan Note (Signed)
This is almost completely resolved She has Magic mouthwash to take as needed

## 2020-08-12 NOTE — Progress Notes (Signed)
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[facility]) Her mucositis, fatigue and anorexia has improved since her treatment break We will resume treatment with reduced dose at 100 mg of niraparib I recommend repeat CT imaging next month for further follow-up and objective assessment of response to therapy She is in agreement  Anorexia This is improved and she is gaining weight Observe closely  Mucositis due to antineoplastic therapy This is almost completely resolved She has Magic mouthwash to take as needed  Anxiety state She appears anxious  Her blood pressure is elevated likely due to anxiety She will continue Xanax as needed for now   Orders Placed This Encounter  Procedures  . CT ABDOMEN PELVIS W CONTRAST    Standing Status:   Future    Standing Expiration Date:   08/12/2021    Order Specific Question:   If indicated for the ordered procedure, I authorize the administration of contrast media per Radiology protocol    Answer:   Yes    Order Specific Question:   Preferred imaging location?    Answer:   [facility]    Order Specific Question:   Radiology Contrast Protocol - do NOT remove file path    Answer:   [email or web address]    All questions were answered. The patient knows to call the clinic with any problems, questions or concerns. The total time spent in the appointment was 20 minutes encounter with patients including review of chart and various tests results, discussions about plan of care and coordination of care plan   [name] Lark, MD 08/12/2020 11:08 AM  INTERVAL HISTORY: Please see below for problem oriented charting. 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 14.7 cm poorly defined soft tissue mass in central pelvis suspicious for primary ovarian carcinoma, with uterine leiomyosarcoma considered a less likely differential diagnosis. 2. Moderate ascites and diffuse peritoneal carcinomatosis. 3. Several small uterine fibroids.   11/13/2018 Tumor Marker   Patient's tumor was tested for the following markers: CA-125 Results of the tumor marker test revealed 1434   11/21/2018 Procedure   Successful ultrasound-guided diagnostic and therapeutic paracentesis yielding 3.1 liters of peritoneal fluid   11/21/2018 Pathology Results   PERITONEAL/ASCITIC FLUID(SPECIMEN 1 OF 1 COLLECTED 11/21/18): ADENOCARCINOMA. Specimen Clinical Information Pelvic mass suspicious for ovarian cancer Source Peritoneal/Ascitic Fluid, (specimen 1 of 1 collected 11/21/18) Gross Specimen: Received is/are 1000 ccs of dark amber fluid. (CM:cm) Prepared: # Smears: 0 # Concentration Technique Slides (i.e. 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   11/29/2018 Initial Diagnosis   Ovarian cancer ([place])   11/29/2018 Cancer Staging   Staging form: Ovary, Fallopian Tube, and Primary Peritoneal Carcinoma, AJCC 8th Edition - Clinical: cT3, cN0, cM0 - Signed by [name] Lark, MD on 11/29/2018   12/03/2018 Procedure   Successful ultrasound-guided therapeutic  paracentesis yielding 3.7 liters of peritoneal fluid.     12/06/2018 Procedure   Placement of a subcutaneous port device. Catheter tip at the SVC and right atrium junction    12/14/2018 Procedure   Successful ultrasound-guided paracentesis yielding 2.9 L of peritoneal fluid   12/28/2018 Tumor Marker   Patient's tumor was tested for the following markers: CA-125 Results of the tumor marker test revealed 812.   12/28/2018 - 04/22/2019 Chemotherapy   The patient had carboplatin and taxol for neoadjuvant treatment, followed by interval debulking surgery and subsequent adjuvant chemotherapy treatment.     02/01/2019 Imaging   Ct abdomen and pelvis 8.6 cm left ovarian mass, corresponding to the patient's known primary neoplasm, improved.   Mild peritoneal nodularity/omental caking, improved.   Small abdominopelvic ascites, improved.     02/01/2019 Tumor Marker   Patient's tumor was tested for the following markers: CA-125 Results of the tumor marker test revealed 183.   02/12/2019 Surgery   Preoperative Diagnosis: Stage IIIC ovarian cancer, s/p neoadjuvant chemotherapy      Procedure(s) Performed: 1. Exploratory laparotomy with total abdominal hysterectomy, bilateral salpingo-oophorectomy, omentectomy radical tumor debulking for ovarian cancer.   Surgeon: Thereasa Solo, MD.    Operative Findings: upper abdomen free of disease. No visible omental disease. Small volume (200cc) ascites. 8cm friable mass replacing left ovary and adherent to the sigmoid colon mesentery and ureter on the left.  Anterior fibroid. This represented an optimal cytoreduction (R0) with no gross visible disease remaining.    02/12/2019 Pathology Results   A. OVARY AND FALLOPIAN TUBE, LEFT, SALPINGO-OOPHORECTOMY: - Serous carcinoma, high grade, status post neoadjuvant therapy - See oncology table and comment below B. UTERUS CERVIX WITH RIGHT FALLOPIAN TUBE AND OVARY, HYSTERECTOMY: Uterus: - Serosal surface involved by serous carcinoma - Endomyometrium uninvolved by carcinoma - Benign endometrial polyp (4.1 cm) - Leiomyomata (5.5 cm; largest) - Adenomyosis Cervix: - Uninvolved by  carcinoma Left ovary: - Serous carcinoma, high grade Left fallopian tube: - Serous carcinoma, high grade C. SOFT TISSUE, LEFT PELVIC SIDEWALL TUMOR, EXCISION: - Metastatic serous carcinoma, high-grade D. SOFT TISSUE, SIGMOID COLON MESENTERY, EXCISION: - Metastatic serous carcinoma, high-grade E. OMENTUM, TUMOR RESECTION: - Metastatic serous carcinoma, high-grade OVARY or FALLOPIAN TUBE or PRIMARY PERITONEUM: Procedure: Total hysterectomy and bilateral salpingo-oophorectomy, omentectomy and peritoneal biopsies Specimen Integrity: Fragmented Tumor Site: Left ovary and fallopian tube Ovarian Surface Involvement: Present Fallopian Tube Surface Involvement: Present Tumor Size: 6.3 cm (see comment) Histologic Type: Serous carcinoma Histologic Grade: High-grade Implants: Not applicable Other Tissue/ Organ Involvement: Right ovary, right fallopian tube, omentum, mesentery Largest Extrapelvic Peritoneal Focus: Macroscopic Peritoneal/Ascitic Fluid: Malignant Treatment Effect: No definite or minimal response identified (chemotherapy response score 1 [CRS1] Regional Lymph Nodes: No lymph nodes submitted or found Pathologic Stage Classification (pTNM, AJCC 8th Edition): pT3c, pNX Representative Tumor Block: A4 Comment(s): Additional testing (HER-2, MMR and MSI) are pending. The primary tumor site appears to be the left ovary and fallopian tube. The uterus is only involved on the serosal surface. There is tumor on the anterior peritoneal reflection.  Addendum: Tumor is Her2 negative,MSI stable   02/19/2019 Tumor Marker   Patient's tumor was tested for the following markers: CA-125 Results of the tumor marker test revealed 40.3   04/01/2019 Tumor Marker   Patient's tumor was tested for the following markers: CA-125 Results of the tumor marker test revealed 8.1    Genetic Testing   Negative testing. No pathogenic variants identified on the [employer]. The report date  is 04/17/2019.  Somatic genes analyzed through [facility]: ATM, BARD1, BRCA1, BRCA2, BRIP1, CHEK2, MRE11A, NBN, PALB2, RAD51C, RAD51D.  The CancerNext gene panel offered by [employer] includes sequencing and rearrangement analysis for the following 36 genes: APC*, ATM*, AXIN2, BARD1, BMPR1A, BRCA1*, BRCA2*, BRIP1*, CDH1*, CDK4, CDKN2A, CHEK2*, DICER1, MLH1*, MSH2*, MSH3, MSH6*, MUTYH*, NBN, NF1*, NTHL1, PALB2*, PMS2*, PTEN*, RAD51C*, RAD51D*, RECQL, SMAD4, SMARCA4, STK11 and TP53* (sequencing and deletion/duplication); HOXB13, POLD1 and POLE (sequencing only); EPCAM and GREM1 (deletion/duplication only).    05/21/2019 Imaging   1. Interval hysterectomy, oophorectomy and omentectomy. No evidence of residual ovarian carcinoma in the pelvis. No evidence of peritoneal disease. No intraperitoneal free fluid. 2. Nonobstructing LEFT renal calculi.  Normal ureters.   05/22/2019 Tumor Marker   Patient's tumor was tested for the following markers: CA-125 Results of the tumor marker test revealed 5.7.   06/03/2019 Procedure   Successful right IJ vein Port-A-Cath explant.   08/26/2019 Tumor Marker   Patient's tumor was tested for the following markers: CA-125. Results of the tumor marker test revealed 4.9   10/03/2019 Imaging   1. New tumor deposits along the capsular surfaces of the liver, spleen, in the left pelvis, and right paracolic gutter, compatible with metastatic disease. 2. Mild dilation of the dorsal pancreatic duct in the pancreatic body and head, cause uncertain. 3. Nonobstructive left nephrolithiasis. 4. Aortic atherosclerosis.   Aortic Atherosclerosis (ICD10-I70.0   10/10/2019 Procedure   Successful placement of a right IJ approach Power Port with ultrasound and fluoroscopic guidance. The catheter is ready for use.     10/11/2019 Tumor Marker   Patient's tumor was tested for the following markers: CA-125 Results of the tumor marker test revealed 19.6.   10/14/2019 -   Chemotherapy   The patient had carboplatin and gemzar for chemotherapy treatment.     11/04/2019 Tumor Marker   Patient's tumor was tested for the following markers: CA-125 Results of the tumor marker test revealed 11.4   11/28/2019 Tumor Marker   Patient's tumor was tested for the following markers: CA-125 Results of the tumor marker test revealed 5.8   12/20/2019 Imaging   1. Significant interval reduction in mixed solid and cystic nodule in the vicinity of the left ovary, with significant improvement or resolution of multiple pelvic, peritoneal, and organ capsule implants. Resolution of previously seen small volume ascites. Findings are consistent with treatment response of abdominal metastatic disease. 2. Status post hysterectomy, oophorectomy, and omentectomy. 3. Nonobstructive left nephrolithiasis. 4. Aortic Atherosclerosis (ICD10-I70.0).   12/23/2019 Tumor Marker   Patient's tumor was tested for the following markers: CA-125 Results of the tumor marker test revealed 5.0   01/24/2020 Tumor Marker   Patient's tumor was tested for the following markers: CA-125 Results of the tumor marker test revealed 4.8   02/24/2020 Tumor Marker   Patient's tumor was tested for the following markers: CA-125. Results of the tumor marker test revealed 4.3.   03/06/2020 Imaging   1. Status post hysterectomy, bilateral oophorectomy, and omentectomy. 2. Resolution of previously described left adnexal nodularity. 3. No residual disease identified. 4.  Aortic Atherosclerosis (ICD10-I70.0). 5. Left nephrolithiasis.   04/20/2020 Tumor Marker   Patient's tumor was tested for the following markers: CA-125 Results of the tumor marker test revealed 4.6   05/18/2020 Tumor Marker   Patient's tumor was tested for the following markers: CA-125 Results of the tumor marker test revealed 4.7   05/29/2020 Imaging   Status post hysterectomy and bilateral salpingo oophorectomy.   No  evidence of recurrent or  metastatic disease.   06/08/2020 - 06/08/2020 Chemotherapy   She is taking niraparib       06/17/2020 Tumor Marker   Patient's tumor was tested for the following markers: CA-125 Results of the tumor marker test revealed 4.5   08/04/2020 Tumor Marker   Patient's tumor was tested for the following markers: CA-125. Results of the tumor marker test revealed 5.7     REVIEW OF SYSTEMS:   Constitutional: Denies fevers, chills or abnormal weight loss Eyes: Denies blurriness of vision Ears, nose, mouth, throat, and face: Denies mucositis or sore throat Respiratory: Denies cough, dyspnea or wheezes Cardiovascular: Denies palpitation, chest discomfort or lower extremity swelling Gastrointestinal:  Denies nausea, heartburn or change in bowel habits Skin: Denies abnormal skin rashes Lymphatics: Denies new lymphadenopathy or easy bruising Neurological:Denies numbness, tingling or new weaknesses Behavioral/Psych: Mood is stable, no new changes  All other systems were reviewed with the patient and are negative.  I have reviewed the past medical history, past surgical history, social history and family history with the patient and they are unchanged from previous note.  ALLERGIES:  is allergic to skin adhesives [cyanoacrylate], shrimp [shellfish allergy], and sulfonamide derivatives.  MEDICATIONS:  Current Outpatient Medications  Medication Sig Dispense Refill  . ALPRAZolam (XANAX) 0.25 MG tablet Take 1 tablet (0.25 mg total) by mouth at bedtime as needed for anxiety. 30 tablet 2  . carboxymethylcellulose (REFRESH PLUS) 0.5 % SOLN Place 1 drop into both eyes 2 (two) times daily as needed (dry eyes).    . Cholecalciferol (VITAMIN D3) 25 MCG (1000 UT) CAPS Take 1,000 Units by mouth daily.     [name] Kitchen esomeprazole (NEXIUM) 20 MG capsule Take 20 mg by mouth daily.     [name] Kitchen estradiol (ESTRACE VAGINAL) 0.1 MG/GM vaginal cream Place 1 Applicatorful vaginally 3 (three) times a week. 42.5 g 12  .  lidocaine-prilocaine (EMLA) cream Apply 1 application topically as needed. 30 g 11  . loratadine (CLARITIN) 10 MG tablet Take 10 mg by mouth daily as needed for allergies.    . Multiple Vitamin (MULTIVITAMIN WITH MINERALS) TABS tablet Take 1 tablet by mouth daily.    . niraparib tosylate (ZEJULA) 100 MG capsule Take 1 capsule (100 mg total) by mouth daily. May take at bedtime to reduce nausea and vomiting. 30 capsule 11  . ondansetron (ZOFRAN) 8 MG tablet Take 1 tablet (8 mg total) by mouth every 8 (eight) hours as needed. 30 tablet 1  . prochlorperazine (COMPAZINE) 10 MG tablet Take 1 tablet (10 mg total) by mouth every 6 (six) hours as needed for nausea or vomiting. 30 tablet 2   No current facility-administered medications for this visit.    PHYSICAL EXAMINATION: ECOG PERFORMANCE STATUS: 1 - Symptomatic but completely ambulatory  Vitals:   08/12/20 1008  BP: (!) 165/90  Pulse: 95  Resp: 18  Temp: 98 F (36.7 C)  SpO2: 100%   Filed Weights   08/12/20 1008  Weight: 151 lb 3.2 oz (68.6 kg)    GENERAL:alert, no distress and comfortable NEURO: alert & oriented x 3 with fluent speech, no focal motor/sensory deficits  LABORATORY DATA:  I have reviewed the data as listed    Component Value Date/Time   NA 141 08/04/2020 0756   K 3.7 08/04/2020 0756   CL 106 08/04/2020 0756   CO2 25 08/04/2020 0756   GLUCOSE 102 (H) 08/04/2020 0756   BUN 10 08/04/2020 0756   CREATININE 0.69 08/04/2020 0756  CREATININE 0.75 06/24/2020 1413   CALCIUM 8.9 08/04/2020 0756   PROT 6.6 08/04/2020 0756   ALBUMIN 4.0 08/04/2020 0756   AST 31 08/04/2020 0756   AST 31 06/24/2020 1413   ALT 29 08/04/2020 0756   ALT 23 06/24/2020 1413   ALKPHOS 90 08/04/2020 0756   BILITOT 1.7 (H) 08/04/2020 0756   BILITOT 1.9 (H) 06/24/2020 1413   GFRNONAA >60 08/04/2020 0756   GFRNONAA >60 06/24/2020 1413   GFRAA >60 02/24/2020 0852    No results found for: SPEP, UPEP  Lab Results  Component Value Date    WBC 4.2 08/04/2020   NEUTROABS 2.5 08/04/2020   HGB 12.2 08/04/2020   HCT 34.7 (L) 08/04/2020   MCV 94.3 08/04/2020   PLT 188 08/04/2020      Chemistry      Component Value Date/Time   NA 141 08/04/2020 0756   K 3.7 08/04/2020 0756   CL 106 08/04/2020 0756   CO2 25 08/04/2020 0756   BUN 10 08/04/2020 0756   CREATININE 0.69 08/04/2020 0756   CREATININE 0.75 06/24/2020 1413      Component Value Date/Time   CALCIUM 8.9 08/04/2020 0756   ALKPHOS 90 08/04/2020 0756   AST 31 08/04/2020 0756   AST 31 06/24/2020 1413   ALT 29 08/04/2020 0756   ALT 23 06/24/2020 1413   BILITOT 1.7 (H) 08/04/2020 0756   BILITOT 1.9 (H) 06/24/2020 1413

## 2020-08-13 MED FILL — ZEJULA 100 MG CAPS: 100 | 30 days supply | Qty: 30 | Fill #0

## 2020-08-18 ENCOUNTER — Other Ambulatory Visit (HOSPITAL_COMMUNITY): Payer: Self-pay

## 2020-09-07 ENCOUNTER — Other Ambulatory Visit: Payer: Self-pay

## 2020-09-07 ENCOUNTER — Other Ambulatory Visit: Payer: Managed Care, Other (non HMO)

## 2020-09-07 ENCOUNTER — Inpatient Hospital Stay: Payer: Managed Care, Other (non HMO)

## 2020-09-07 ENCOUNTER — Ambulatory Visit (HOSPITAL_COMMUNITY)
Admission: RE | Admit: 2020-09-07 | Discharge: 2020-09-07 | Disposition: A | Discharge status: Home / Self Care | Payer: Managed Care, Other (non HMO) | Source: Ambulatory Visit | Attending: Hematology and Oncology | Admitting: Hematology and Oncology

## 2020-09-07 ENCOUNTER — Other Ambulatory Visit (HOSPITAL_COMMUNITY): Payer: Self-pay

## 2020-09-07 ENCOUNTER — Inpatient Hospital Stay: Payer: Managed Care, Other (non HMO) | Attending: Gynecologic Oncology

## 2020-09-07 ENCOUNTER — Encounter (HOSPITAL_COMMUNITY): Payer: Self-pay

## 2020-09-07 DIAGNOSIS — F419 Anxiety disorder, unspecified: Secondary | ICD-10-CM | POA: Insufficient documentation

## 2020-09-07 DIAGNOSIS — M21611 Bunion of right foot: Secondary | ICD-10-CM | POA: Insufficient documentation

## 2020-09-07 DIAGNOSIS — M21612 Bunion of left foot: Secondary | ICD-10-CM | POA: Insufficient documentation

## 2020-09-07 DIAGNOSIS — R748 Abnormal levels of other serum enzymes: Secondary | ICD-10-CM | POA: Insufficient documentation

## 2020-09-07 DIAGNOSIS — C562 Malignant neoplasm of left ovary: Secondary | ICD-10-CM | POA: Insufficient documentation

## 2020-09-07 DIAGNOSIS — C786 Secondary malignant neoplasm of retroperitoneum and peritoneum: Secondary | ICD-10-CM | POA: Insufficient documentation

## 2020-09-07 LAB — CBC WITH DIFFERENTIAL/PLATELET
Abs Immature Granulocytes: 0 10*3/uL (ref 0.00–0.07)
Basophils Absolute: 0 10*3/uL (ref 0.0–0.1)
Basophils Relative: 0 %
Eosinophils Absolute: 0 10*3/uL (ref 0.0–0.5)
Eosinophils Relative: 1 %
HCT: 37.5 % (ref 36.0–46.0)
Hemoglobin: 13 g/dL (ref 12.0–15.0)
Immature Granulocytes: 0 %
Lymphocytes Relative: 25 %
Lymphs Abs: 1.2 10*3/uL (ref 0.7–4.0)
MCH: 34.1 pg — ABNORMAL HIGH (ref 26.0–34.0)
MCHC: 34.7 g/dL (ref 30.0–36.0)
MCV: 98.4 fL (ref 80.0–100.0)
Monocytes Absolute: 0.4 10*3/uL (ref 0.1–1.0)
Monocytes Relative: 8 %
Neutro Abs: 3 10*3/uL (ref 1.7–7.7)
Neutrophils Relative %: 66 %
Platelets: UNDETERMINED 10*3/uL (ref 150–400)
RBC: 3.81 MIL/uL — ABNORMAL LOW (ref 3.87–5.11)
RDW: 13.9 % (ref 11.5–15.5)
WBC: 4.6 10*3/uL (ref 4.0–10.5)
nRBC: 0 % (ref 0.0–0.2)

## 2020-09-07 LAB — COMPREHENSIVE METABOLIC PANEL
ALT: 23 U/L (ref 0–44)
AST: 33 U/L (ref 15–41)
Albumin: 4.1 g/dL (ref 3.5–5.0)
Alkaline Phosphatase: 81 U/L (ref 38–126)
Anion gap: 12 (ref 5–15)
BUN: 11 mg/dL (ref 8–23)
CO2: 24 mmol/L (ref 22–32)
Calcium: 9 mg/dL (ref 8.9–10.3)
Chloride: 107 mmol/L (ref 98–111)
Creatinine, Ser: 0.65 mg/dL (ref 0.44–1.00)
GFR, Estimated: >60 mL/min (ref >60)
Glucose, Bld: 97 mg/dL (ref 70–99)
Potassium: 3.7 mmol/L (ref 3.5–5.1)
Sodium: 143 mmol/L (ref 135–145)
Total Bilirubin: 1.8 mg/dL — ABNORMAL HIGH (ref 0.3–1.2)
Total Protein: 6.7 g/dL (ref 6.5–8.1)

## 2020-09-07 MED ORDER — HEPARIN SOD (PORK) LOCK FLUSH 100 UNIT/ML IV SOLN
INTRAVENOUS | Status: AC
  Filled 2020-09-07: qty 5

## 2020-09-07 MED ORDER — IOHEXOL 300 MG/ML  SOLN
100.0000 mL | Freq: Once | INTRAMUSCULAR | Status: AC | PRN
  Administered 2020-09-07: 100 mL via INTRAVENOUS

## 2020-09-07 MED ORDER — HEPARIN SOD (PORK) LOCK FLUSH 100 UNIT/ML IV SOLN: 500.0000 [IU] | Freq: Once | INTRAVENOUS | Status: DC

## 2020-09-07 NOTE — Patient Instructions (Signed)

## 2020-09-08 ENCOUNTER — Inpatient Hospital Stay (HOSPITAL_BASED_OUTPATIENT_CLINIC_OR_DEPARTMENT_OTHER): Payer: Managed Care, Other (non HMO) | Admitting: Hematology and Oncology

## 2020-09-08 ENCOUNTER — Encounter: Payer: Self-pay | Admitting: Hematology and Oncology

## 2020-09-08 DIAGNOSIS — F411 Generalized anxiety disorder: Secondary | ICD-10-CM

## 2020-09-08 DIAGNOSIS — M21611 Bunion of right foot: Secondary | ICD-10-CM | POA: Diagnosis not present

## 2020-09-08 DIAGNOSIS — R748 Abnormal levels of other serum enzymes: Secondary | ICD-10-CM | POA: Diagnosis not present

## 2020-09-08 DIAGNOSIS — C562 Malignant neoplasm of left ovary: Secondary | ICD-10-CM

## 2020-09-08 DIAGNOSIS — M21612 Bunion of left foot: Secondary | ICD-10-CM

## 2020-09-08 DIAGNOSIS — C786 Secondary malignant neoplasm of retroperitoneum and peritoneum: Secondary | ICD-10-CM | POA: Diagnosis not present

## 2020-09-08 DIAGNOSIS — F419 Anxiety disorder, unspecified: Secondary | ICD-10-CM | POA: Diagnosis not present

## 2020-09-08 LAB — CA 125: Cancer Antigen (CA) 125: 4.8 U/mL (ref 0.0–38.1)

## 2020-09-08 NOTE — Assessment & Plan Note (Signed)
She has chronic elevated bilirubin Observe closely for now She has no signs of liver disease on CT imaging

## 2020-09-08 NOTE — Progress Notes (Signed)
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 fairly well Her blood counts are within normal limits Her chronic elevated bilirubin is unrelated CT imaging and tumor marker are completely normal The plan will be to continue this indefinitely In the absence of disease, I do not recommend repeating imaging study for at least 6 months, next due around October I will see her every other month She will continue niraparib as prescribed  Elevated liver enzymes She has chronic elevated bilirubin Observe closely for now She has no signs of liver disease on CT imaging  Bilateral bunions I recommend referral to podiatrist for management  Anxiety state She is able to cope with anxiety better with reduced dose niraparib She has prescription alprazolam to take as needed I suspect her anxiety is the cause of her elevated blood pressure today   Orders Placed This Encounter  Procedures  . Ambulatory referral to Podiatry    Referral Priority:   Routine    Referral Type:   Consultation    Referral Reason:   Specialty Services Required    Requested Specialty:   Podiatry    Number of Visits Requested:   1    All questions were answered. The patient knows to call the clinic with any problems, questions or concerns. The total time spent in the appointment was 30 minutes encounter with patients including review of chart and various tests results, discussions about plan of care and coordination of care plan   [name] Lark, MD 09/08/2020 2:13 PM  INTERVAL HISTORY: Please see below for problem oriented charting. 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 14.7 cm poorly defined soft tissue mass in central pelvis suspicious for primary ovarian carcinoma, with uterine leiomyosarcoma considered a less likely differential diagnosis. 2. Moderate ascites and diffuse peritoneal carcinomatosis. 3. Several small uterine fibroids.   11/13/2018 Tumor Marker   Patient's tumor was tested for the following markers: CA-125 Results of the tumor marker test revealed 1434   11/21/2018 Procedure   Successful ultrasound-guided diagnostic and therapeutic paracentesis yielding 3.1 liters of peritoneal fluid   11/21/2018 Pathology Results   PERITONEAL/ASCITIC FLUID(SPECIMEN 1 OF 1 COLLECTED 11/21/18): ADENOCARCINOMA. Specimen Clinical Information Pelvic mass suspicious for ovarian cancer Source Peritoneal/Ascitic Fluid, (specimen 1 of 1 collected 11/21/18) Gross Specimen: Received is/are 1000 ccs of dark amber fluid. (CM:cm) Prepared: # Smears: 0 # Concentration Technique Slides (i.e. 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   11/29/2018 Initial Diagnosis   Ovarian cancer ([place])   11/29/2018 Cancer Staging   Staging form: Ovary, Fallopian Tube, and Primary Peritoneal Carcinoma, AJCC 8th Edition - Clinical: cT3, cN0, cM0 - Signed by [name] Lark, MD on 11/29/2018   12/03/2018 Procedure   Successful ultrasound-guided therapeutic paracentesis yielding 3.7 liters of peritoneal fluid.     12/06/2018 Procedure  Placement of a subcutaneous port device. Catheter tip at the SVC and right atrium junction   12/14/2018 Procedure   Successful ultrasound-guided paracentesis yielding 2.9 L of peritoneal fluid    12/28/2018 Tumor Marker   Patient's tumor was tested for the following markers: CA-125 Results of the tumor marker test revealed 812.   12/28/2018 - 04/22/2019 Chemotherapy   The patient had carboplatin and taxol for neoadjuvant treatment, followed by interval debulking surgery and subsequent adjuvant chemotherapy treatment.     02/01/2019 Imaging   Ct abdomen and pelvis 8.6 cm left ovarian mass, corresponding to the patient's known primary neoplasm, improved.   Mild peritoneal nodularity/omental caking, improved.   Small abdominopelvic ascites, improved.     02/01/2019 Tumor Marker   Patient's tumor was tested for the following markers: CA-125 Results of the tumor marker test revealed 183.   02/12/2019 Surgery   Preoperative Diagnosis: Stage IIIC ovarian cancer, s/p neoadjuvant chemotherapy      Procedure(s) Performed: 1. Exploratory laparotomy with total abdominal hysterectomy, bilateral salpingo-oophorectomy, omentectomy radical tumor debulking for ovarian cancer.   Surgeon: Luisa Dago, MD.    Operative Findings: upper abdomen free of disease. No visible omental disease. Small volume (200cc) ascites. 8cm friable mass replacing left ovary and adherent to the sigmoid colon mesentery and ureter on the left.  Anterior fibroid. This represented an optimal cytoreduction (R0) with no gross visible disease remaining.    02/12/2019 Pathology Results   A. OVARY AND FALLOPIAN TUBE, LEFT, SALPINGO-OOPHORECTOMY: - Serous carcinoma, high grade, status post neoadjuvant therapy - See oncology table and comment below B. UTERUS CERVIX WITH RIGHT FALLOPIAN TUBE AND OVARY, HYSTERECTOMY: Uterus: - Serosal surface involved by serous carcinoma - Endomyometrium uninvolved by carcinoma - Benign endometrial polyp (4.1 cm) - Leiomyomata (5.5 cm; largest) - Adenomyosis Cervix: - Uninvolved by carcinoma Left ovary: - Serous carcinoma, high grade Left fallopian tube: - Serous carcinoma, high  grade C. SOFT TISSUE, LEFT PELVIC SIDEWALL TUMOR, EXCISION: - Metastatic serous carcinoma, high-grade D. SOFT TISSUE, SIGMOID COLON MESENTERY, EXCISION: - Metastatic serous carcinoma, high-grade E. OMENTUM, TUMOR RESECTION: - Metastatic serous carcinoma, high-grade OVARY or FALLOPIAN TUBE or PRIMARY PERITONEUM: Procedure: Total hysterectomy and bilateral salpingo-oophorectomy, omentectomy and peritoneal biopsies Specimen Integrity: Fragmented Tumor Site: Left ovary and fallopian tube Ovarian Surface Involvement: Present Fallopian Tube Surface Involvement: Present Tumor Size: 6.3 cm (see comment) Histologic Type: Serous carcinoma Histologic Grade: High-grade Implants: Not applicable Other Tissue/ Organ Involvement: Right ovary, right fallopian tube, omentum, mesentery Largest Extrapelvic Peritoneal Focus: Macroscopic Peritoneal/Ascitic Fluid: Malignant Treatment Effect: No definite or minimal response identified (chemotherapy response score 1 [CRS1] Regional Lymph Nodes: No lymph nodes submitted or found Pathologic Stage Classification (pTNM, AJCC 8th Edition): pT3c, pNX Representative Tumor Block: A4 Comment(s): Additional testing (HER-2, MMR and MSI) are pending. The primary tumor site appears to be the left ovary and fallopian tube. The uterus is only involved on the serosal surface. There is tumor on the anterior peritoneal reflection.  Addendum: Tumor is Her2 negative,MSI stable   02/19/2019 Tumor Marker   Patient's tumor was tested for the following markers: CA-125 Results of the tumor marker test revealed 40.3   04/01/2019 Tumor Marker   Patient's tumor was tested for the following markers: CA-125 Results of the tumor marker test revealed 8.1    Genetic Testing   Negative testing. No pathogenic variants identified on the [employer]. The report date is 04/17/2019.  Somatic genes analyzed through [facility]: ATM, BARD1, BRCA1, BRCA2, BRIP1, CHEK2,  MRE11A, NBN, PALB2, RAD51C, RAD51D.  The [facility]Next gene panel offered by [employer] includes sequencing and rearrangement analysis for the following 36 genes: APC*, ATM*, AXIN2, BARD1, BMPR1A, BRCA1*, BRCA2*, BRIP1*, CDH1*, CDK4, CDKN2A, CHEK2*, DICER1, MLH1*, MSH2*, MSH3, MSH6*, MUTYH*, NBN, NF1*, NTHL1, PALB2*, PMS2*, PTEN*, RAD51C*, RAD51D*, RECQL, SMAD4, SMARCA4, STK11 and TP53* (sequencing and deletion/duplication); HOXB13, POLD1 and POLE (sequencing only); EPCAM and GREM1 (deletion/duplication only).    05/21/2019 Imaging   1. Interval hysterectomy, oophorectomy and omentectomy. No evidence of residual ovarian carcinoma in the pelvis. No evidence of peritoneal disease. No intraperitoneal free fluid. 2. Nonobstructing LEFT renal calculi.  Normal ureters.   05/22/2019 Tumor Marker   Patient's tumor was tested for the following markers: CA-125 Results of the tumor marker test revealed 5.7.   06/03/2019 Procedure   Successful right IJ vein Port-A-Cath explant.   08/26/2019 Tumor Marker   Patient's tumor was tested for the following markers: CA-125. Results of the tumor marker test revealed 4.9   10/03/2019 Imaging   1. New tumor deposits along the capsular surfaces of the liver, spleen, in the left pelvis, and right paracolic gutter, compatible with metastatic disease. 2. Mild dilation of the dorsal pancreatic duct in the pancreatic body and head, cause uncertain. 3. Nonobstructive left nephrolithiasis. 4. Aortic atherosclerosis.   Aortic Atherosclerosis (ICD10-I70.0   10/10/2019 Procedure   Successful placement of a right IJ approach Power Port with ultrasound and fluoroscopic guidance. The catheter is ready for use.     10/11/2019 Tumor Marker   Patient's tumor was tested for the following markers: CA-125 Results of the tumor marker test revealed 19.6.   10/14/2019 -  Chemotherapy   The patient had carboplatin and gemzar for chemotherapy treatment.     11/04/2019 Tumor Marker    Patient's tumor was tested for the following markers: CA-125 Results of the tumor marker test revealed 11.4   11/28/2019 Tumor Marker   Patient's tumor was tested for the following markers: CA-125 Results of the tumor marker test revealed 5.8   12/20/2019 Imaging   1. Significant interval reduction in mixed solid and cystic nodule in the vicinity of the left ovary, with significant improvement or resolution of multiple pelvic, peritoneal, and organ capsule implants. Resolution of previously seen small volume ascites. Findings are consistent with treatment response of abdominal metastatic disease. 2. Status post hysterectomy, oophorectomy, and omentectomy. 3. Nonobstructive left nephrolithiasis. 4. Aortic Atherosclerosis (ICD10-I70.0).   12/23/2019 Tumor Marker   Patient's tumor was tested for the following markers: CA-125 Results of the tumor marker test revealed 5.0   01/24/2020 Tumor Marker   Patient's tumor was tested for the following markers: CA-125 Results of the tumor marker test revealed 4.8   02/24/2020 Tumor Marker   Patient's tumor was tested for the following markers: CA-125. Results of the tumor marker test revealed 4.3.   03/06/2020 Imaging   1. Status post hysterectomy, bilateral oophorectomy, and omentectomy. 2. Resolution of previously described left adnexal nodularity. 3. No residual disease identified. 4.  Aortic Atherosclerosis (ICD10-I70.0). 5. Left nephrolithiasis.   04/20/2020 Tumor Marker   Patient's tumor was tested for the following markers: CA-125 Results of the tumor marker test revealed 4.6   05/18/2020 Tumor Marker   Patient's tumor was tested for the following markers: CA-125 Results of the tumor marker test revealed 4.7   05/29/2020 Imaging   Status post hysterectomy and bilateral salpingo oophorectomy.   No evidence of recurrent or metastatic disease.   06/08/2020 - 06/08/2020 Chemotherapy  She is taking niraparib       06/17/2020 Tumor  Marker   Patient's tumor was tested for the following markers: CA-125 Results of the tumor marker test revealed 4.5   08/04/2020 Tumor Marker   Patient's tumor was tested for the following markers: CA-125. Results of the tumor marker test revealed 5.7   09/07/2020 Imaging   1. Status post hysterectomy and bilateral salpingo oophorectomy. No evidence of recurrent or metastatic disease. 2. Nonobstructing left nephrolithiasis. 3. Left-sided colonic diverticulosis without findings of acute diverticulitis. 4. Aortic atherosclerosis.     09/07/2020 Tumor Marker   Patient's tumor was tested for the following markers: CA-125 Results of the tumor marker test revealed 4.8.     REVIEW OF SYSTEMS:   Constitutional: Denies fevers, chills or abnormal weight loss Eyes: Denies blurriness of vision Ears, nose, mouth, throat, and face: Denies mucositis or sore throat Respiratory: Denies cough, dyspnea or wheezes Cardiovascular: Denies palpitation, chest discomfort or lower extremity swelling Gastrointestinal:  Denies nausea, heartburn or change in bowel habits Skin: Denies abnormal skin rashes Lymphatics: Denies new lymphadenopathy or easy bruising Neurological:Denies numbness, tingling or new weaknesses Behavioral/Psych: Mood is stable, no new changes  All other systems were reviewed with the patient and are negative.  I have reviewed the past medical history, past surgical history, social history and family history with the patient and they are unchanged from previous note.  ALLERGIES:  is allergic to skin adhesives [cyanoacrylate], shrimp [shellfish allergy], and sulfonamide derivatives.  MEDICATIONS:  Current Outpatient Medications  Medication Sig Dispense Refill  . ALPRAZolam (XANAX) 0.25 MG tablet TAKE 1 TABLET BY MOUTH EVERY NIGHT AT BEDTIME AS NEEDED FOR ANXIETY 30 tablet 2  . carboxymethylcellulose (REFRESH PLUS) 0.5 % SOLN Place 1 drop into both eyes 2 (two) times daily as needed (dry  eyes).    . Cholecalciferol (VITAMIN D3) 25 MCG (1000 UT) CAPS Take 1,000 Units by mouth daily.     [name] Kitchen esomeprazole (NEXIUM) 20 MG capsule Take 20 mg by mouth daily.     [name] Kitchen estradiol (ESTRACE VAGINAL) 0.1 MG/GM vaginal cream Place 1 Applicatorful vaginally 3 (three) times a week. 42.5 g 12  . lidocaine-prilocaine (EMLA) cream APPLY 1 APPLICATION TOPICALLY AS NEEDED. 30 g 11  . loratadine (CLARITIN) 10 MG tablet Take 10 mg by mouth daily as needed for allergies.    . Multiple Vitamin (MULTIVITAMIN WITH MINERALS) TABS tablet Take 1 tablet by mouth daily.    . niraparib tosylate (ZEJULA) 100 MG capsule TAKE 1 CAPSULE (100 MG TOTAL) BY MOUTH DAILY. MAY TAKE AT BEDTIME TO REDUCE NAUSEA AND VOMITING. 30 capsule 11  . ondansetron (ZOFRAN) 8 MG tablet TAKE 1 TABLET BY MOUTH EVERY 8 HOURS AS NEEDED 30 tablet 1  . prochlorperazine (COMPAZINE) 10 MG tablet TAKE 1 TABLET BY MOUTH EVERY 6 HOURS AS NEEDED FOR NAUSEA OR VOMITING 30 tablet 2   No current facility-administered medications for this visit.    PHYSICAL EXAMINATION: ECOG PERFORMANCE STATUS: 0 - Asymptomatic  Vitals:   09/08/20 1346  BP: (!) 169/79  Pulse: 93  Resp: 18  Temp: 97.7 F (36.5 C)  SpO2: 100%   Filed Weights   09/08/20 1346  Weight: 153 lb 3.2 oz (69.5 kg)    GENERAL:alert, no distress and comfortable NEURO: alert & oriented x 3 with fluent speech, no focal motor/sensory deficits  LABORATORY DATA:  I have reviewed the data as listed    Component Value Date/Time   NA 143 09/07/2020 1047  K 3.7 09/07/2020 1047   CL 107 09/07/2020 1047   CO2 24 09/07/2020 1047   GLUCOSE 97 09/07/2020 1047   BUN 11 09/07/2020 1047   CREATININE 0.65 09/07/2020 1047   CREATININE 0.75 06/24/2020 1413   CALCIUM 9.0 09/07/2020 1047   PROT 6.7 09/07/2020 1047   ALBUMIN 4.1 09/07/2020 1047   AST 33 09/07/2020 1047   AST 31 06/24/2020 1413   ALT 23 09/07/2020 1047   ALT 23 06/24/2020 1413   ALKPHOS 81 09/07/2020 1047   BILITOT 1.8 (H)  09/07/2020 1047   BILITOT 1.9 (H) 06/24/2020 1413   GFRNONAA >60 09/07/2020 1047   GFRNONAA >60 06/24/2020 1413   GFRAA >60 02/24/2020 0852    No results found for: SPEP, UPEP  Lab Results  Component Value Date   WBC 4.6 09/07/2020   NEUTROABS 3.0 09/07/2020   HGB 13.0 09/07/2020   HCT 37.5 09/07/2020   MCV 98.4 09/07/2020   PLT PLATELET CLUMPS NOTED ON SMEAR, UNABLE TO ESTIMATE 09/07/2020      Chemistry      Component Value Date/Time   NA 143 09/07/2020 1047   K 3.7 09/07/2020 1047   CL 107 09/07/2020 1047   CO2 24 09/07/2020 1047   BUN 11 09/07/2020 1047   CREATININE 0.65 09/07/2020 1047   CREATININE 0.75 06/24/2020 1413      Component Value Date/Time   CALCIUM 9.0 09/07/2020 1047   ALKPHOS 81 09/07/2020 1047   AST 33 09/07/2020 1047   AST 31 06/24/2020 1413   ALT 23 09/07/2020 1047   ALT 23 06/24/2020 1413   BILITOT 1.8 (H) 09/07/2020 1047   BILITOT 1.9 (H) 06/24/2020 1413       RADIOGRAPHIC STUDIES: I have reviewed imaging study with the patient I have personally reviewed the radiological images as listed and agreed with the findings in the report. CT ABDOMEN PELVIS W CONTRAST  Result Date: 09/07/2020 CLINICAL DATA:  Ovarian cancer, assess treatment response. EXAM: CT ABDOMEN AND PELVIS WITH CONTRAST TECHNIQUE: Multidetector CT imaging of the abdomen and pelvis was performed using the standard protocol following bolus administration of intravenous contrast. CONTRAST:  112mL OMNIPAQUE IOHEXOL 300 MG/ML  SOLN COMPARISON:  CT abdomen pelvis May 29, 2020. FINDINGS: Lower chest: No acute abnormality. Normal size heart. No significant pericardial effusion/thickening. Hepatobiliary: No suspicious hepatic lesions. Gallbladder is unremarkable. No biliary ductal dilation. Pancreas: Within normal limits. Spleen: Within normal limits. Adrenals/Urinary Tract: Bilateral adrenal glands are within normal limits. Right kidney is within normal limits without hydronephrosis,  nephrolithiasis or solid enhancing renal masses. Multiple nonobstructive left renal stones measuring up to 7 mm. No left hydronephrosis. No left-sided solid enhancing renal mass. Urinary bladder is grossly unremarkable for degree of distension. Stomach/Bowel: Tiny hiatal hernia otherwise the stomach is grossly unremarkable. No suspicious small bowel wall thickening or dilation. Normal appendix. Left-sided colonic diverticulosis without findings of acute diverticulitis. Vascular/Lymphatic: Aortic atherosclerosis. No gastrohepatic or hepatoduodenal ligament lymphadenopathy. No retroperitoneal or mesenteric lymphadenopathy. No pelvic sidewall lymphadenopathy. No groin lymphadenopathy. Reproductive: Status post hysterectomy and bilateral salpingo oophorectomy. No adnexal masses. Other: No abdominopelvic ascites. Status post omentectomy. No overt peritoneal nodularity or omental caking. Tiny nonobstructive Richter type ventral hernia on image 39/2. Musculoskeletal: Mild multilevel degenerative change. No aggressive lytic or blastic lesion of bone. IMPRESSION: 1. Status post hysterectomy and bilateral salpingo oophorectomy. No evidence of recurrent or metastatic disease. 2. Nonobstructing left nephrolithiasis. 3. Left-sided colonic diverticulosis without findings of acute diverticulitis. 4. Aortic atherosclerosis. Aortic Atherosclerosis (ICD10-I70.0). Electronically  Signed   By: Dahlia Bailiff MD   On: 09/07/2020 15:08

## 2020-09-08 NOTE — Assessment & Plan Note (Signed)
I recommend referral to podiatrist for management

## 2020-09-08 NOTE — Assessment & Plan Note (Signed)
She tolerated chemotherapy fairly well Her blood counts are within normal limits Her chronic elevated bilirubin is unrelated CT imaging and tumor marker are completely normal The plan will be to continue this indefinitely In the absence of disease, I do not recommend repeating imaging study for at least 6 months, next due around October I will see her every other month She will continue niraparib as prescribed

## 2020-09-08 NOTE — Assessment & Plan Note (Signed)
She is able to cope with anxiety better with reduced dose niraparib She has prescription alprazolam to take as needed I suspect her anxiety is the cause of her elevated blood pressure today

## 2020-09-10 ENCOUNTER — Other Ambulatory Visit (HOSPITAL_COMMUNITY): Payer: Self-pay

## 2020-09-10 MED FILL — Niraparib Tosylate Cap 100 MG (Base Equivalent): ORAL | 30 days supply | Qty: 30 | Fill #0 | Status: AC

## 2020-09-11 ENCOUNTER — Telehealth: Payer: Self-pay

## 2020-09-11 ENCOUNTER — Other Ambulatory Visit (HOSPITAL_COMMUNITY): Payer: Self-pay

## 2020-09-11 NOTE — Telephone Encounter (Signed)
She called and left a message to call her. Called back. Thursday am she started having severe stomach cramps and diarrhea. At onset she had chills and a cold sweat. She was unable to eat anything yesterday. She is drinking lots of fluids. Today she feels some better. X 2 diarrhea episodes today. Small amount of diarrhea stool with mucus and she saw dark red blood in diarrhea. Denies fever. She is going to try to eat something and push fluids. She will call back later today with update. She does not feel like she should go to the ER at this time.

## 2020-09-11 NOTE — Telephone Encounter (Signed)
Called back. She is feeling better. Appetite is coming back. She ate a small amount for breakfast and is drinking with no problems. No further diarrhea. Denies fever. She will call back on Monday to give update.

## 2020-09-12 ENCOUNTER — Other Ambulatory Visit (HOSPITAL_COMMUNITY): Payer: Self-pay

## 2020-09-30 ENCOUNTER — Encounter: Payer: Self-pay | Admitting: Medical

## 2020-10-05 ENCOUNTER — Other Ambulatory Visit (HOSPITAL_COMMUNITY): Payer: Self-pay

## 2020-10-07 ENCOUNTER — Telehealth: Payer: Self-pay | Admitting: Hematology and Oncology

## 2020-10-07 ENCOUNTER — Other Ambulatory Visit (HOSPITAL_COMMUNITY): Payer: Self-pay

## 2020-10-07 NOTE — Telephone Encounter (Signed)
Rescheduled appointment per provider request. Left a detailed message.

## 2020-10-09 ENCOUNTER — Other Ambulatory Visit (HOSPITAL_COMMUNITY): Payer: Self-pay

## 2020-10-09 MED FILL — Niraparib Tosylate Cap 100 MG (Base Equivalent): ORAL | 30 days supply | Qty: 30 | Fill #1 | Status: AC

## 2020-10-10 ENCOUNTER — Other Ambulatory Visit (HOSPITAL_COMMUNITY): Payer: Self-pay

## 2020-10-21 ENCOUNTER — Other Ambulatory Visit: Payer: Self-pay

## 2020-10-21 ENCOUNTER — Inpatient Hospital Stay: Payer: Managed Care, Other (non HMO) | Attending: Gynecologic Oncology

## 2020-10-21 ENCOUNTER — Other Ambulatory Visit: Payer: Managed Care, Other (non HMO)

## 2020-10-21 ENCOUNTER — Ambulatory Visit: Payer: Managed Care, Other (non HMO) | Admitting: Hematology and Oncology

## 2020-10-21 DIAGNOSIS — C562 Malignant neoplasm of left ovary: Secondary | ICD-10-CM | POA: Diagnosis present

## 2020-10-21 DIAGNOSIS — C786 Secondary malignant neoplasm of retroperitoneum and peritoneum: Secondary | ICD-10-CM | POA: Insufficient documentation

## 2020-10-21 DIAGNOSIS — Z7189 Other specified counseling: Secondary | ICD-10-CM

## 2020-10-21 LAB — COMPREHENSIVE METABOLIC PANEL
ALT: 22 U/L (ref 0–44)
AST: 31 U/L (ref 15–41)
Albumin: 4 g/dL (ref 3.5–5.0)
Alkaline Phosphatase: 74 U/L (ref 38–126)
Anion gap: 10 (ref 5–15)
BUN: 9 mg/dL (ref 8–23)
CO2: 24 mmol/L (ref 22–32)
Calcium: 9.3 mg/dL (ref 8.9–10.3)
Chloride: 106 mmol/L (ref 98–111)
Creatinine, Ser: 0.65 mg/dL (ref 0.44–1.00)
GFR, Estimated: >60 mL/min (ref >60)
Glucose, Bld: 105 mg/dL — ABNORMAL HIGH (ref 70–99)
Potassium: 3.8 mmol/L (ref 3.5–5.1)
Sodium: 140 mmol/L (ref 135–145)
Total Bilirubin: 1.7 mg/dL — ABNORMAL HIGH (ref 0.3–1.2)
Total Protein: 6.8 g/dL (ref 6.5–8.1)

## 2020-10-21 LAB — CBC WITH DIFFERENTIAL/PLATELET
Abs Immature Granulocytes: 0.01 10*3/uL (ref 0.00–0.07)
Basophils Absolute: 0 10*3/uL (ref 0.0–0.1)
Basophils Relative: 0 %
Eosinophils Absolute: 0 10*3/uL (ref 0.0–0.5)
Eosinophils Relative: 0 %
HCT: 38.1 % (ref 36.0–46.0)
Hemoglobin: 13.5 g/dL (ref 12.0–15.0)
Immature Granulocytes: 0 %
Lymphocytes Relative: 22 %
Lymphs Abs: 1.1 10*3/uL (ref 0.7–4.0)
MCH: 33.9 pg (ref 26.0–34.0)
MCHC: 35.4 g/dL (ref 30.0–36.0)
MCV: 95.7 fL (ref 80.0–100.0)
Monocytes Absolute: 0.4 10*3/uL (ref 0.1–1.0)
Monocytes Relative: 8 %
Neutro Abs: 3.4 10*3/uL (ref 1.7–7.7)
Neutrophils Relative %: 70 %
Platelets: 195 10*3/uL (ref 150–400)
RBC: 3.98 MIL/uL (ref 3.87–5.11)
RDW: 11.9 % (ref 11.5–15.5)
WBC: 4.9 10*3/uL (ref 4.0–10.5)
nRBC: 0 % (ref 0.0–0.2)

## 2020-10-21 MED ORDER — HEPARIN SOD (PORK) LOCK FLUSH 100 UNIT/ML IV SOLN
500.0000 [IU] | Freq: Once | INTRAVENOUS | Status: AC
  Administered 2020-10-21: 500 [IU]
  Filled 2020-10-21: qty 5

## 2020-10-21 MED ORDER — SODIUM CHLORIDE 0.9% FLUSH
10.0000 mL | Freq: Once | INTRAVENOUS | Status: AC
  Administered 2020-10-21: 10 mL
  Filled 2020-10-21: qty 10

## 2020-10-22 ENCOUNTER — Telehealth: Payer: Self-pay

## 2020-10-22 LAB — CA 125: Cancer Antigen (CA) 125: 5.1 U/mL (ref 0.0–38.1)

## 2020-10-22 NOTE — Telephone Encounter (Signed)
-----   Message from [name] Lark, MD sent at 10/22/2020  7:51 AM EDT ----- Pls call her and let her know labs and CA-125 is good

## 2020-10-22 NOTE — Telephone Encounter (Signed)
Called and given below message. She verbalized understanding. 

## 2020-10-29 ENCOUNTER — Other Ambulatory Visit: Payer: Managed Care, Other (non HMO)

## 2020-10-29 ENCOUNTER — Ambulatory Visit: Payer: Managed Care, Other (non HMO) | Admitting: Hematology and Oncology

## 2020-11-02 ENCOUNTER — Inpatient Hospital Stay (HOSPITAL_BASED_OUTPATIENT_CLINIC_OR_DEPARTMENT_OTHER): Payer: Managed Care, Other (non HMO) | Admitting: Hematology and Oncology

## 2020-11-02 ENCOUNTER — Encounter: Payer: Self-pay | Admitting: Hematology and Oncology

## 2020-11-02 ENCOUNTER — Other Ambulatory Visit: Payer: Self-pay

## 2020-11-02 DIAGNOSIS — C562 Malignant neoplasm of left ovary: Secondary | ICD-10-CM

## 2020-11-02 DIAGNOSIS — F411 Generalized anxiety disorder: Secondary | ICD-10-CM | POA: Diagnosis not present

## 2020-11-02 DIAGNOSIS — R252 Cramp and spasm: Secondary | ICD-10-CM | POA: Diagnosis not present

## 2020-11-02 NOTE — Progress Notes (Signed)
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[facility]) She tolerated chemotherapy fairly well Her blood counts are within normal limits Her chronic elevated bilirubin is unrelated Her last CT imaging and tumor marker are completely normal The plan will be to continue this indefinitely In the absence of disease, I do not recommend repeating imaging study for at least 6 months, next due around October I will see her every other month She will continue niraparib as prescribed  Anxiety state She is able to cope with anxiety better with reduced dose niraparib She has prescription alprazolam to take as needed I suspect her anxiety is the cause of her elevated blood pressure today  Leg cramps She has intermittent leg cramps I recommend vitamin D supplement It is okay for her to take magnesium supplement as well  No orders of the defined types were placed in this encounter.   All questions were answered. The patient knows to call the clinic with any problems, questions or concerns. The total time spent in the appointment was 20 minutes encounter with patients including review of chart and various tests results, discussions about plan of care and coordination of care plan   [name] Lark, MD 11/02/2020 12:07 PM  INTERVAL HISTORY: Please see below for problem oriented charting. 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 14.7 cm poorly defined soft tissue mass in central pelvis suspicious for primary ovarian carcinoma, with uterine leiomyosarcoma considered a less likely differential diagnosis. 2. Moderate ascites and diffuse peritoneal carcinomatosis. 3. Several small uterine fibroids.   11/13/2018 Tumor Marker   Patient's tumor was tested for the following markers: CA-125 Results of the tumor marker test revealed 1434   11/21/2018 Procedure   Successful ultrasound-guided diagnostic and therapeutic paracentesis yielding 3.1 liters of peritoneal fluid   11/21/2018 Pathology Results   PERITONEAL/ASCITIC FLUID(SPECIMEN 1 OF 1 COLLECTED 11/21/18): ADENOCARCINOMA. Specimen Clinical Information Pelvic mass suspicious for ovarian cancer Source Peritoneal/Ascitic Fluid, (specimen 1 of 1 collected 11/21/18) Gross Specimen: Received is/are 1000 ccs of dark amber fluid. (CM:cm) Prepared: # Smears: 0 # Concentration Technique Slides (i.e. 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   11/29/2018 Initial Diagnosis   Ovarian cancer ([place])   11/29/2018 Cancer Staging   Staging form: Ovary, Fallopian Tube, and Primary Peritoneal Carcinoma, AJCC 8th Edition - Clinical: cT3, cN0, cM0 - Signed by [name] Lark, MD on 11/29/2018    12/03/2018 Procedure   Successful ultrasound-guided therapeutic paracentesis yielding 3.7 liters of peritoneal fluid.     12/06/2018 Procedure   Placement of a subcutaneous port device. Catheter tip at the SVC and right atrium junction   12/14/2018 Procedure   Successful ultrasound-guided paracentesis yielding 2.9 L of peritoneal fluid   12/28/2018 Tumor Marker   Patient's tumor was tested for the following markers: CA-125  Results of the tumor marker test revealed 812.   12/28/2018 - 04/22/2019  Chemotherapy   The patient had carboplatin and taxol for neoadjuvant treatment, followed by interval debulking surgery and subsequent adjuvant chemotherapy treatment.     02/01/2019 Imaging   Ct abdomen and pelvis 8.6 cm left ovarian mass, corresponding to the patient's known primary neoplasm, improved.   Mild peritoneal nodularity/omental caking, improved.   Small abdominopelvic ascites, improved.     02/01/2019 Tumor Marker   Patient's tumor was tested for the following markers: CA-125 Results of the tumor marker test revealed 183.   02/12/2019 Surgery   Preoperative Diagnosis: Stage IIIC ovarian cancer, s/p neoadjuvant chemotherapy      Procedure(s) Performed: 1. Exploratory laparotomy with total abdominal hysterectomy, bilateral salpingo-oophorectomy, omentectomy radical tumor debulking for ovarian cancer.   Surgeon: Thereasa Solo, MD.    Operative Findings: upper abdomen free of disease. No visible omental disease. Small volume (200cc) ascites. 8cm friable mass replacing left ovary and adherent to the sigmoid colon mesentery and ureter on the left.  Anterior fibroid. This represented an optimal cytoreduction (R0) with no gross visible disease remaining.    02/12/2019 Pathology Results   A. OVARY AND FALLOPIAN TUBE, LEFT, SALPINGO-OOPHORECTOMY: - Serous carcinoma, high grade, status post neoadjuvant therapy - See oncology table and comment below B. UTERUS CERVIX WITH RIGHT FALLOPIAN TUBE AND OVARY, HYSTERECTOMY: Uterus: - Serosal surface involved by serous carcinoma - Endomyometrium uninvolved by carcinoma - Benign endometrial polyp (4.1 cm) - Leiomyomata (5.5 cm; largest) - Adenomyosis Cervix: - Uninvolved by carcinoma Left ovary: - Serous carcinoma, high grade Left fallopian tube: - Serous carcinoma, high grade C. SOFT TISSUE, LEFT PELVIC SIDEWALL TUMOR, EXCISION: - Metastatic serous carcinoma, high-grade D. SOFT TISSUE, SIGMOID COLON MESENTERY, EXCISION: - Metastatic  serous carcinoma, high-grade E. OMENTUM, TUMOR RESECTION: - Metastatic serous carcinoma, high-grade OVARY or FALLOPIAN TUBE or PRIMARY PERITONEUM: Procedure: Total hysterectomy and bilateral salpingo-oophorectomy, omentectomy and peritoneal biopsies Specimen Integrity: Fragmented Tumor Site: Left ovary and fallopian tube Ovarian Surface Involvement: Present Fallopian Tube Surface Involvement: Present Tumor Size: 6.3 cm (see comment) Histologic Type: Serous carcinoma Histologic Grade: High-grade Implants: Not applicable Other Tissue/ Organ Involvement: Right ovary, right fallopian tube, omentum, mesentery Largest Extrapelvic Peritoneal Focus: Macroscopic Peritoneal/Ascitic Fluid: Malignant Treatment Effect: No definite or minimal response identified (chemotherapy response score 1 [CRS1] Regional Lymph Nodes: No lymph nodes submitted or found Pathologic Stage Classification (pTNM, AJCC 8th Edition): pT3c, pNX Representative Tumor Block: A4 Comment(s): Additional testing (HER-2, MMR and MSI) are pending. The primary tumor site appears to be the left ovary and fallopian tube. The uterus is only involved on the serosal surface. There is tumor on the anterior peritoneal reflection.  Addendum: Tumor is Her2 negative,MSI stable   02/19/2019 Tumor Marker   Patient's tumor was tested for the following markers: CA-125 Results of the tumor marker test revealed 40.3   04/01/2019 Tumor Marker   Patient's tumor was tested for the following markers: CA-125 Results of the tumor marker test revealed 8.1    Genetic Testing   Negative testing. No pathogenic variants identified on the [employer]. The report date is 04/17/2019.  Somatic genes analyzed through [facility]: ATM, BARD1, BRCA1, BRCA2, BRIP1, CHEK2, MRE11A, NBN, PALB2, RAD51C, RAD51D.  The [facility]Next gene panel offered by [employer] includes sequencing and rearrangement analysis for the following 36 genes:  APC*, ATM*, AXIN2, BARD1, BMPR1A, BRCA1*, BRCA2*, BRIP1*, CDH1*, CDK4, CDKN2A, CHEK2*, DICER1, MLH1*, MSH2*, MSH3, MSH6*, MUTYH*, NBN, NF1*, NTHL1, PALB2*,  PMS2*, PTEN*, RAD51C*, RAD51D*, RECQL, SMAD4, SMARCA4, STK11 and TP53* (sequencing and deletion/duplication); HOXB13, POLD1 and POLE (sequencing only); EPCAM and GREM1 (deletion/duplication only).    05/21/2019 Imaging   1. Interval hysterectomy, oophorectomy and omentectomy. No evidence of residual ovarian carcinoma in the pelvis. No evidence of peritoneal disease. No intraperitoneal free fluid. 2. Nonobstructing LEFT renal calculi.  Normal ureters.   05/22/2019 Tumor Marker   Patient's tumor was tested for the following markers: CA-125 Results of the tumor marker test revealed 5.7.   06/03/2019 Procedure   Successful right IJ vein Port-A-Cath explant.   08/26/2019 Tumor Marker   Patient's tumor was tested for the following markers: CA-125. Results of the tumor marker test revealed 4.9   10/03/2019 Imaging   1. New tumor deposits along the capsular surfaces of the liver, spleen, in the left pelvis, and right paracolic gutter, compatible with metastatic disease. 2. Mild dilation of the dorsal pancreatic duct in the pancreatic body and head, cause uncertain. 3. Nonobstructive left nephrolithiasis. 4. Aortic atherosclerosis.   Aortic Atherosclerosis (ICD10-I70.0   10/10/2019 Procedure   Successful placement of a right IJ approach Power Port with ultrasound and fluoroscopic guidance. The catheter is ready for use.     10/11/2019 Tumor Marker   Patient's tumor was tested for the following markers: CA-125 Results of the tumor marker test revealed 19.6.   10/14/2019 -  Chemotherapy   The patient had carboplatin and gemzar for chemotherapy treatment.     11/04/2019 Tumor Marker   Patient's tumor was tested for the following markers: CA-125 Results of the tumor marker test revealed 11.4   11/28/2019 Tumor Marker   Patient's tumor was  tested for the following markers: CA-125 Results of the tumor marker test revealed 5.8   12/20/2019 Imaging   1. Significant interval reduction in mixed solid and cystic nodule in the vicinity of the left ovary, with significant improvement or resolution of multiple pelvic, peritoneal, and organ capsule implants. Resolution of previously seen small volume ascites. Findings are consistent with treatment response of abdominal metastatic disease. 2. Status post hysterectomy, oophorectomy, and omentectomy. 3. Nonobstructive left nephrolithiasis. 4. Aortic Atherosclerosis (ICD10-I70.0).   12/23/2019 Tumor Marker   Patient's tumor was tested for the following markers: CA-125 Results of the tumor marker test revealed 5.0   01/24/2020 Tumor Marker   Patient's tumor was tested for the following markers: CA-125 Results of the tumor marker test revealed 4.8   02/24/2020 Tumor Marker   Patient's tumor was tested for the following markers: CA-125. Results of the tumor marker test revealed 4.3.   03/06/2020 Imaging   1. Status post hysterectomy, bilateral oophorectomy, and omentectomy. 2. Resolution of previously described left adnexal nodularity. 3. No residual disease identified. 4.  Aortic Atherosclerosis (ICD10-I70.0). 5. Left nephrolithiasis.   04/20/2020 Tumor Marker   Patient's tumor was tested for the following markers: CA-125 Results of the tumor marker test revealed 4.6   05/18/2020 Tumor Marker   Patient's tumor was tested for the following markers: CA-125 Results of the tumor marker test revealed 4.7   05/29/2020 Imaging   Status post hysterectomy and bilateral salpingo oophorectomy.   No evidence of recurrent or metastatic disease.   06/08/2020 - 06/08/2020 Chemotherapy   She is taking niraparib        06/17/2020 Tumor Marker   Patient's tumor was tested for the following markers: CA-125 Results of the tumor marker test revealed 4.5   08/04/2020 Tumor Marker   Patient's tumor  was tested for  the following markers: CA-125. Results of the tumor marker test revealed 5.7   09/07/2020 Imaging   1. Status post hysterectomy and bilateral salpingo oophorectomy. No evidence of recurrent or metastatic disease. 2. Nonobstructing left nephrolithiasis. 3. Left-sided colonic diverticulosis without findings of acute diverticulitis. 4. Aortic atherosclerosis.     09/07/2020 Tumor Marker   Patient's tumor was tested for the following markers: CA-125 Results of the tumor marker test revealed 4.8.   10/21/2020 Tumor Marker   Patient's tumor was tested for the following markers: CA-125. Results of the tumor marker test revealed 5.1.     REVIEW OF SYSTEMS:   Constitutional: Denies fevers, chills or abnormal weight loss Eyes: Denies blurriness of vision Ears, nose, mouth, throat, and face: Denies mucositis or sore throat Respiratory: Denies cough, dyspnea or wheezes Cardiovascular: Denies palpitation, chest discomfort or lower extremity swelling Gastrointestinal:  Denies nausea, heartburn or change in bowel habits Skin: Denies abnormal skin rashes Lymphatics: Denies new lymphadenopathy or easy bruising Neurological:Denies numbness, tingling or new weaknesses Behavioral/Psych: Mood is stable, no new changes  All other systems were reviewed with the patient and are negative.  I have reviewed the past medical history, past surgical history, social history and family history with the patient and they are unchanged from previous note.  ALLERGIES:  is allergic to skin adhesives [cyanoacrylate], shrimp [shellfish allergy], and sulfonamide derivatives.  MEDICATIONS:  Current Outpatient Medications  Medication Sig Dispense Refill   ALPRAZolam (XANAX) 0.25 MG tablet TAKE 1 TABLET BY MOUTH EVERY NIGHT AT BEDTIME AS NEEDED FOR ANXIETY 30 tablet 2   carboxymethylcellulose (REFRESH PLUS) 0.5 % SOLN Place 1 drop into both eyes 2 (two) times daily as needed (dry eyes).     Cholecalciferol  (VITAMIN D3) 25 MCG (1000 UT) CAPS Take 1,000 Units by mouth daily.      esomeprazole (NEXIUM) 20 MG capsule Take 20 mg by mouth daily.      estradiol (ESTRACE VAGINAL) 0.1 MG/GM vaginal cream Place 1 Applicatorful vaginally 3 (three) times a week. 42.5 g 12   lidocaine-prilocaine (EMLA) cream APPLY 1 APPLICATION TOPICALLY AS NEEDED. 30 g 11   loratadine (CLARITIN) 10 MG tablet Take 10 mg by mouth daily as needed for allergies.     Multiple Vitamin (MULTIVITAMIN WITH MINERALS) TABS tablet Take 1 tablet by mouth daily.     niraparib tosylate (ZEJULA) 100 MG capsule TAKE 1 CAPSULE (100 MG TOTAL) BY MOUTH DAILY. MAY TAKE AT BEDTIME TO REDUCE NAUSEA AND VOMITING. 30 capsule 11   ondansetron (ZOFRAN) 8 MG tablet TAKE 1 TABLET BY MOUTH EVERY 8 HOURS AS NEEDED 30 tablet 1   prochlorperazine (COMPAZINE) 10 MG tablet TAKE 1 TABLET BY MOUTH EVERY 6 HOURS AS NEEDED FOR NAUSEA OR VOMITING 30 tablet 2   No current facility-administered medications for this visit.    PHYSICAL EXAMINATION: ECOG PERFORMANCE STATUS: 0 - Asymptomatic  Vitals:   11/02/20 0956  BP: (!) 147/68  Pulse: 94  Resp: 18  Temp: 98.5 F (36.9 C)  SpO2: 100%   Filed Weights   11/02/20 0956  Weight: 154 lb 9.6 oz (70.1 kg)    GENERAL:alert, no distress and comfortable SKIN: skin color, texture, turgor are normal, no rashes or significant lesions EYES: normal, Conjunctiva are pink and non-injected, sclera clear OROPHARYNX:no exudate, no erythema and lips, buccal mucosa, and tongue normal  NECK: supple, thyroid normal size, non-tender, without nodularity LYMPH:  no palpable lymphadenopathy in the cervical, axillary or inguinal LUNGS: clear to auscultation and percussion  with normal breathing effort HEART: regular rate & rhythm and no murmurs and no lower extremity edema ABDOMEN:abdomen soft, non-tender and normal bowel sounds Musculoskeletal:no cyanosis of digits and no clubbing  NEURO: alert & oriented x 3 with fluent  speech, no focal motor/sensory deficits  LABORATORY DATA:  I have reviewed the data as listed    Component Value Date/Time   NA 140 10/21/2020 1019   K 3.8 10/21/2020 1019   CL 106 10/21/2020 1019   CO2 24 10/21/2020 1019   GLUCOSE 105 (H) 10/21/2020 1019   BUN 9 10/21/2020 1019   CREATININE 0.65 10/21/2020 1019   CREATININE 0.75 06/24/2020 1413   CALCIUM 9.3 10/21/2020 1019   PROT 6.8 10/21/2020 1019   ALBUMIN 4.0 10/21/2020 1019   AST 31 10/21/2020 1019   AST 31 06/24/2020 1413   ALT 22 10/21/2020 1019   ALT 23 06/24/2020 1413   ALKPHOS 74 10/21/2020 1019   BILITOT 1.7 (H) 10/21/2020 1019   BILITOT 1.9 (H) 06/24/2020 1413   GFRNONAA >60 10/21/2020 1019   GFRNONAA >60 06/24/2020 1413   GFRAA >60 02/24/2020 0852    No results found for: SPEP, UPEP  Lab Results  Component Value Date   WBC 4.9 10/21/2020   NEUTROABS 3.4 10/21/2020   HGB 13.5 10/21/2020   HCT 38.1 10/21/2020   MCV 95.7 10/21/2020   PLT 195 10/21/2020      Chemistry      Component Value Date/Time   NA 140 10/21/2020 1019   K 3.8 10/21/2020 1019   CL 106 10/21/2020 1019   CO2 24 10/21/2020 1019   BUN 9 10/21/2020 1019   CREATININE 0.65 10/21/2020 1019   CREATININE 0.75 06/24/2020 1413      Component Value Date/Time   CALCIUM 9.3 10/21/2020 1019   ALKPHOS 74 10/21/2020 1019   AST 31 10/21/2020 1019   AST 31 06/24/2020 1413   ALT 22 10/21/2020 1019   ALT 23 06/24/2020 1413   BILITOT 1.7 (H) 10/21/2020 1019   BILITOT 1.9 (H) 06/24/2020 1413

## 2020-11-02 NOTE — Assessment & Plan Note (Signed)
She is able to cope with anxiety better with reduced dose niraparib She has prescription alprazolam to take as needed I suspect her anxiety is the cause of her elevated blood pressure today

## 2020-11-02 NOTE — Assessment & Plan Note (Signed)
She tolerated chemotherapy fairly well Her blood counts are within normal limits Her chronic elevated bilirubin is unrelated Her last CT imaging and tumor marker are completely normal The plan will be to continue this indefinitely In the absence of disease, I do not recommend repeating imaging study for at least 6 months, next due around October I will see her every other month She will continue niraparib as prescribed

## 2020-11-02 NOTE — Assessment & Plan Note (Signed)
She has intermittent leg cramps I recommend vitamin D supplement It is okay for her to take magnesium supplement as well

## 2020-11-04 ENCOUNTER — Other Ambulatory Visit (HOSPITAL_COMMUNITY): Payer: Self-pay

## 2020-11-06 ENCOUNTER — Other Ambulatory Visit: Payer: Self-pay | Admitting: Internal Medicine

## 2020-11-06 ENCOUNTER — Encounter: Payer: Self-pay | Admitting: Hematology and Oncology

## 2020-11-06 DIAGNOSIS — Z1231 Encounter for screening mammogram for malignant neoplasm of breast: Secondary | ICD-10-CM

## 2020-11-09 ENCOUNTER — Other Ambulatory Visit: Payer: Self-pay

## 2020-11-09 ENCOUNTER — Other Ambulatory Visit (HOSPITAL_COMMUNITY): Payer: Self-pay

## 2020-11-09 ENCOUNTER — Ambulatory Visit
Admission: RE | Admit: 2020-11-09 | Discharge: 2020-11-09 | Disposition: A | Discharge status: Home / Self Care | Payer: Managed Care, Other (non HMO) | Source: Ambulatory Visit | Attending: Internal Medicine | Admitting: Internal Medicine

## 2020-11-09 DIAGNOSIS — Z1231 Encounter for screening mammogram for malignant neoplasm of breast: Secondary | ICD-10-CM

## 2020-11-09 MED FILL — Niraparib Tosylate Cap 100 MG (Base Equivalent): ORAL | 30 days supply | Qty: 30 | Fill #2 | Status: AC

## 2020-11-11 ENCOUNTER — Ambulatory Visit (INDEPENDENT_AMBULATORY_CARE_PROVIDER_SITE_OTHER): Payer: Managed Care, Other (non HMO) | Admitting: Podiatry

## 2020-11-11 ENCOUNTER — Ambulatory Visit (INDEPENDENT_AMBULATORY_CARE_PROVIDER_SITE_OTHER): Payer: Managed Care, Other (non HMO)

## 2020-11-11 ENCOUNTER — Other Ambulatory Visit: Payer: Self-pay

## 2020-11-11 DIAGNOSIS — M67479 Ganglion, unspecified ankle and foot: Secondary | ICD-10-CM | POA: Diagnosis not present

## 2020-11-11 DIAGNOSIS — M21612 Bunion of left foot: Secondary | ICD-10-CM | POA: Diagnosis not present

## 2020-11-11 DIAGNOSIS — M21611 Bunion of right foot: Secondary | ICD-10-CM

## 2020-11-11 NOTE — Progress Notes (Signed)
HPI: 65 y.o. female presenting today as a new patient for evaluation of what she believes is a bunion deformity to the bilateral feet.  Patient states they are not really painful but she would like to have them evaluated.  She also states that her second toes curved into her great toe.  Again they are not painful or symptomatic but she is concerned.  Finally the patient states that over the past 2 weeks she noticed a bump developed to the dorsal aspect of the left ankle.  She went to her PCP to have it evaluated and she was referred here.  She presents for further treatment and evaluation  Past Medical History:  Diagnosis Date   Anxiety state, unspecified    Diverticulosis    Esophageal reflux    Family history of breast cancer    Family history of lymphoma    Family history of stomach cancer    Family history of uterine cancer    Fibroid tumor    Headache    due to allergies    Hiatal hernia    Hyperlipidemia    IBS (irritable bowel syndrome)    Internal hemorrhoids    Nonspecific elevation of levels of transaminase or lactic acid dehydrogenase (LDH)    ovarian ca dx'd 11/2018   Renal stone 07/2013   Tubulovillous adenoma of colon    Varicose veins    superficail thrombophlebitis (left LE)     Physical Exam: General: The patient is alert and oriented x3 in no acute distress.  Dermatology: Skin is warm, dry and supple bilateral lower extremities. Negative for open lesions or macerations.  Vascular: Palpable pedal pulses bilaterally. No edema or erythema noted. Capillary refill within normal limits.  Neurological: Epicritic and protective threshold grossly intact bilaterally.   Musculoskeletal Exam: Range of motion within normal limits to all pedal and ankle joints bilateral. Muscle strength 5/5 in all groups bilateral.  Not adhered fluctuant soft tissue mass noted to the anterior aspect of the left ankle about 1 cm in diameter.  Findings consistent with a ganglion cyst of the  extensor tendons crossing the ankle  Radiographic Exam:  Normal osseous mineralization. Joint spaces preserved. No fracture/dislocation/boney destruction.  No real radiographic evidence of bunion deformity or hammertoe contracture noted.  Assessment: 1.  Ganglion cyst left anterior ankle 2.  Otherwise mostly normal foot structure   Plan of Care:  1. Patient evaluated. X-Rays reviewed.  2.  Pressure was applied to the ganglion cyst of the anterior aspect of the ankle and it was ruptured and resorbed into the surrounding soft tissues quite easily. 3.  In regards to the suppose it bunion or hammertoe deformity, I explained to the patient that it is not symptomatic and her foot structure is actually very good.  There may be some slight deviation of the second digits intruding into the great toe and silicone toe spacers were provided 4.  Continue good supportive shoes from [employer] 5.  Return to clinic as needed      Edrick Kins, DPM [facility]  Dr. Edrick Kins, DPM    [address], [place] [address]  Office [phone]  Fax [phone]

## 2020-11-20 ENCOUNTER — Encounter: Payer: Self-pay | Admitting: Hematology and Oncology

## 2020-11-25 ENCOUNTER — Other Ambulatory Visit (HOSPITAL_COMMUNITY): Payer: Self-pay

## 2020-12-02 ENCOUNTER — Other Ambulatory Visit (HOSPITAL_COMMUNITY): Payer: Self-pay

## 2020-12-09 ENCOUNTER — Other Ambulatory Visit (HOSPITAL_COMMUNITY): Payer: Self-pay

## 2020-12-09 MED FILL — Niraparib Tosylate Cap 100 MG (Base Equivalent): ORAL | 30 days supply | Qty: 30 | Fill #3 | Status: AC

## 2020-12-14 ENCOUNTER — Inpatient Hospital Stay (HOSPITAL_BASED_OUTPATIENT_CLINIC_OR_DEPARTMENT_OTHER): Payer: Managed Care, Other (non HMO) | Admitting: Hematology and Oncology

## 2020-12-14 ENCOUNTER — Inpatient Hospital Stay: Payer: Managed Care, Other (non HMO) | Attending: Gynecologic Oncology

## 2020-12-14 ENCOUNTER — Other Ambulatory Visit: Payer: Self-pay

## 2020-12-14 ENCOUNTER — Encounter: Payer: Self-pay | Admitting: Hematology and Oncology

## 2020-12-14 DIAGNOSIS — C562 Malignant neoplasm of left ovary: Secondary | ICD-10-CM

## 2020-12-14 DIAGNOSIS — C187 Malignant neoplasm of sigmoid colon: Secondary | ICD-10-CM | POA: Diagnosis not present

## 2020-12-14 DIAGNOSIS — C563 Malignant neoplasm of bilateral ovaries: Secondary | ICD-10-CM | POA: Diagnosis not present

## 2020-12-14 DIAGNOSIS — F411 Generalized anxiety disorder: Secondary | ICD-10-CM | POA: Diagnosis not present

## 2020-12-14 DIAGNOSIS — R748 Abnormal levels of other serum enzymes: Secondary | ICD-10-CM | POA: Diagnosis not present

## 2020-12-14 DIAGNOSIS — C786 Secondary malignant neoplasm of retroperitoneum and peritoneum: Secondary | ICD-10-CM | POA: Diagnosis not present

## 2020-12-14 DIAGNOSIS — Z79899 Other long term (current) drug therapy: Secondary | ICD-10-CM | POA: Insufficient documentation

## 2020-12-14 DIAGNOSIS — Z7189 Other specified counseling: Secondary | ICD-10-CM

## 2020-12-14 LAB — CBC WITH DIFFERENTIAL/PLATELET
Abs Immature Granulocytes: 0.02 10*3/uL (ref 0.00–0.07)
Basophils Absolute: 0 10*3/uL (ref 0.0–0.1)
Basophils Relative: 1 %
Eosinophils Absolute: 0.1 10*3/uL (ref 0.0–0.5)
Eosinophils Relative: 2 %
HCT: 38 % (ref 36.0–46.0)
Hemoglobin: 13.6 g/dL (ref 12.0–15.0)
Immature Granulocytes: 0 %
Lymphocytes Relative: 28 %
Lymphs Abs: 1.3 10*3/uL (ref 0.7–4.0)
MCH: 33.2 pg (ref 26.0–34.0)
MCHC: 35.8 g/dL (ref 30.0–36.0)
MCV: 92.7 fL (ref 80.0–100.0)
Monocytes Absolute: 0.5 10*3/uL (ref 0.1–1.0)
Monocytes Relative: 10 %
Neutro Abs: 2.7 10*3/uL (ref 1.7–7.7)
Neutrophils Relative %: 59 %
Platelets: 210 10*3/uL (ref 150–400)
RBC: 4.1 MIL/uL (ref 3.87–5.11)
RDW: 12.5 % (ref 11.5–15.5)
WBC: 4.5 10*3/uL (ref 4.0–10.5)
nRBC: 0 % (ref 0.0–0.2)

## 2020-12-14 LAB — COMPREHENSIVE METABOLIC PANEL
ALT: 29 U/L (ref 0–44)
AST: 38 U/L (ref 15–41)
Albumin: 3.9 g/dL (ref 3.5–5.0)
Alkaline Phosphatase: 83 U/L (ref 38–126)
Anion gap: 9 (ref 5–15)
BUN: 12 mg/dL (ref 8–23)
CO2: 26 mmol/L (ref 22–32)
Calcium: 9.2 mg/dL (ref 8.9–10.3)
Chloride: 105 mmol/L (ref 98–111)
Creatinine, Ser: 0.72 mg/dL (ref 0.44–1.00)
GFR, Estimated: >60 mL/min (ref >60)
Glucose, Bld: 105 mg/dL — ABNORMAL HIGH (ref 70–99)
Potassium: 4 mmol/L (ref 3.5–5.1)
Sodium: 140 mmol/L (ref 135–145)
Total Bilirubin: 2.2 mg/dL — ABNORMAL HIGH (ref 0.3–1.2)
Total Protein: 6.5 g/dL (ref 6.5–8.1)

## 2020-12-14 MED ORDER — SODIUM CHLORIDE 0.9% FLUSH
10.0000 mL | Freq: Once | INTRAVENOUS | Status: AC
  Administered 2020-12-14: 10 mL
  Filled 2020-12-14: qty 10

## 2020-12-14 MED ORDER — HEPARIN SOD (PORK) LOCK FLUSH 100 UNIT/ML IV SOLN
500.0000 [IU] | Freq: Once | INTRAVENOUS | Status: AC
  Administered 2020-12-14: 500 [IU]
  Filled 2020-12-14: qty 5

## 2020-12-14 NOTE — Assessment & Plan Note (Signed)
She tolerated chemotherapy fairly well Her blood counts are within normal limits Her chronic elevated bilirubin is unrelated Her last CT imaging and tumor marker are completely normal The plan will be to continue this indefinitely In the absence of disease, I do not recommend repeating imaging study for at least 6 months, next due around October I will see her every other month She will continue niraparib as prescribed

## 2020-12-14 NOTE — Progress Notes (Signed)
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 chemotherapy fairly well Her blood counts are within normal limits Her chronic elevated bilirubin is unrelated Her last CT imaging and tumor marker are completely normal The plan will be to continue this indefinitely In the absence of disease, I do not recommend repeating imaging study for at least 6 months, next due around October I will see her every other month She will continue niraparib as prescribed  Elevated liver enzymes She has chronic elevated bilirubin Observe closely for now  Anxiety state She is able to cope with anxiety better with reduced dose niraparib She has prescription alprazolam to take as needed I suspect her anxiety is the cause of her elevated blood pressure today  No orders of the defined types were placed in this encounter.   All questions were answered. The patient knows to call the clinic with any problems, questions or concerns. The total time spent in the appointment was 20 minutes encounter with patients including review of chart and various tests results, discussions about plan of care and coordination of care plan   [name] Lark, MD 12/14/2020 9:17 AM  INTERVAL HISTORY: Please see below for problem oriented charting. 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 14.7 cm poorly defined soft tissue mass in central pelvis suspicious for primary  ovarian carcinoma, with uterine leiomyosarcoma considered a less likely differential diagnosis. 2. Moderate ascites and diffuse peritoneal carcinomatosis. 3. Several small uterine fibroids.   11/13/2018 Tumor Marker   Patient's tumor was tested for the following markers: CA-125 Results of the tumor marker test revealed 1434   11/21/2018 Procedure   Successful ultrasound-guided diagnostic and therapeutic paracentesis yielding 3.1 liters of peritoneal fluid   11/21/2018 Pathology Results   PERITONEAL/ASCITIC FLUID(SPECIMEN 1 OF 1 COLLECTED 11/21/18): ADENOCARCINOMA. Specimen Clinical Information Pelvic mass suspicious for ovarian cancer Source Peritoneal/Ascitic Fluid, (specimen 1 of 1 collected 11/21/18) Gross Specimen: Received is/are 1000 ccs of dark amber fluid. (CM:cm) Prepared: # Smears: 0 # Concentration Technique Slides (i.e. 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   11/29/2018 Initial Diagnosis   Ovarian cancer ([place])   11/29/2018 Cancer Staging   Staging form: Ovary, Fallopian Tube, and Primary Peritoneal Carcinoma, AJCC 8th Edition - Clinical: cT3, cN0, cM0 - Signed by [name] Lark, MD on 11/29/2018    12/03/2018 Procedure   Successful ultrasound-guided therapeutic paracentesis yielding 3.7 liters of peritoneal fluid.     12/06/2018 Procedure   Placement of a subcutaneous port device. Catheter tip at the SVC and right atrium junction   12/14/2018 Procedure   Successful ultrasound-guided paracentesis yielding 2.9 L of peritoneal fluid   12/28/2018 Tumor Marker   Patient's tumor was tested for the following markers: CA-125 Results of the tumor marker test revealed 812.   12/28/2018 - 04/22/2019 Chemotherapy   The patient had carboplatin and taxol for neoadjuvant treatment, followed by interval debulking surgery  and subsequent adjuvant chemotherapy treatment.     02/01/2019 Imaging   Ct  abdomen and pelvis 8.6 cm left ovarian mass, corresponding to the patient's known primary neoplasm, improved.   Mild peritoneal nodularity/omental caking, improved.   Small abdominopelvic ascites, improved.     02/01/2019 Tumor Marker   Patient's tumor was tested for the following markers: CA-125 Results of the tumor marker test revealed 183.   02/12/2019 Surgery   Preoperative Diagnosis: Stage IIIC ovarian cancer, s/p neoadjuvant chemotherapy      Procedure(s) Performed: 1. Exploratory laparotomy with total abdominal hysterectomy, bilateral salpingo-oophorectomy, omentectomy radical tumor debulking for ovarian cancer.   Surgeon: Thereasa Solo, MD.    Operative Findings: upper abdomen free of disease. No visible omental disease. Small volume (200cc) ascites. 8cm friable mass replacing left ovary and adherent to the sigmoid colon mesentery and ureter on the left.  Anterior fibroid. This represented an optimal cytoreduction (R0) with no gross visible disease remaining.    02/12/2019 Pathology Results   A. OVARY AND FALLOPIAN TUBE, LEFT, SALPINGO-OOPHORECTOMY: - Serous carcinoma, high grade, status post neoadjuvant therapy - See oncology table and comment below B. UTERUS CERVIX WITH RIGHT FALLOPIAN TUBE AND OVARY, HYSTERECTOMY: Uterus: - Serosal surface involved by serous carcinoma - Endomyometrium uninvolved by carcinoma - Benign endometrial polyp (4.1 cm) - Leiomyomata (5.5 cm; largest) - Adenomyosis Cervix: - Uninvolved by carcinoma Left ovary: - Serous carcinoma, high grade Left fallopian tube: - Serous carcinoma, high grade C. SOFT TISSUE, LEFT PELVIC SIDEWALL TUMOR, EXCISION: - Metastatic serous carcinoma, high-grade D. SOFT TISSUE, SIGMOID COLON MESENTERY, EXCISION: - Metastatic serous carcinoma, high-grade E. OMENTUM, TUMOR RESECTION: - Metastatic serous carcinoma, high-grade OVARY or FALLOPIAN TUBE or PRIMARY PERITONEUM: Procedure: Total hysterectomy and bilateral  salpingo-oophorectomy, omentectomy and peritoneal biopsies Specimen Integrity: Fragmented Tumor Site: Left ovary and fallopian tube Ovarian Surface Involvement: Present Fallopian Tube Surface Involvement: Present Tumor Size: 6.3 cm (see comment) Histologic Type: Serous carcinoma Histologic Grade: High-grade Implants: Not applicable Other Tissue/ Organ Involvement: Right ovary, right fallopian tube, omentum, mesentery Largest Extrapelvic Peritoneal Focus: Macroscopic Peritoneal/Ascitic Fluid: Malignant Treatment Effect: No definite or minimal response identified (chemotherapy response score 1 [CRS1] Regional Lymph Nodes: No lymph nodes submitted or found Pathologic Stage Classification (pTNM, AJCC 8th Edition): pT3c, pNX Representative Tumor Block: A4 Comment(s): Additional testing (HER-2, MMR and MSI) are pending. The primary tumor site appears to be the left ovary and fallopian tube. The uterus is only involved on the serosal surface. There is tumor on the anterior peritoneal reflection.  Addendum: Tumor is Her2 negative,MSI stable   02/19/2019 Tumor Marker   Patient's tumor was tested for the following markers: CA-125 Results of the tumor marker test revealed 40.3   04/01/2019 Tumor Marker   Patient's tumor was tested for the following markers: CA-125 Results of the tumor marker test revealed 8.1    Genetic Testing   Negative testing. No pathogenic variants identified on the [employer]. The report date is 04/17/2019.  Somatic genes analyzed through [facility]: ATM, BARD1, BRCA1, BRCA2, BRIP1, CHEK2, MRE11A, NBN, PALB2, RAD51C, RAD51D.  The [facility]Next gene panel offered by [employer] includes sequencing and rearrangement analysis for the following 36 genes: APC*, ATM*, AXIN2, BARD1, BMPR1A, BRCA1*, BRCA2*, BRIP1*, CDH1*, CDK4, CDKN2A, CHEK2*, DICER1, MLH1*, MSH2*, MSH3, MSH6*, MUTYH*, NBN, NF1*, NTHL1, PALB2*, PMS2*, PTEN*, RAD51C*, RAD51D*, RECQL,  SMAD4, SMARCA4, STK11 and TP53* (sequencing and deletion/duplication); HOXB13, POLD1 and POLE (sequencing only); EPCAM and GREM1 (deletion/duplication only).    05/21/2019 Imaging  1. Interval hysterectomy, oophorectomy and omentectomy. No evidence of residual ovarian carcinoma in the pelvis. No evidence of peritoneal disease. No intraperitoneal free fluid. 2. Nonobstructing LEFT renal calculi.  Normal ureters.   05/22/2019 Tumor Marker   Patient's tumor was tested for the following markers: CA-125 Results of the tumor marker test revealed 5.7.   06/03/2019 Procedure   Successful right IJ vein Port-A-Cath explant.   08/26/2019 Tumor Marker   Patient's tumor was tested for the following markers: CA-125. Results of the tumor marker test revealed 4.9   10/03/2019 Imaging   1. New tumor deposits along the capsular surfaces of the liver, spleen, in the left pelvis, and right paracolic gutter, compatible with metastatic disease. 2. Mild dilation of the dorsal pancreatic duct in the pancreatic body and head, cause uncertain. 3. Nonobstructive left nephrolithiasis. 4. Aortic atherosclerosis.   Aortic Atherosclerosis (ICD10-I70.0   10/10/2019 Procedure   Successful placement of a right IJ approach Power Port with ultrasound and fluoroscopic guidance. The catheter is ready for use.     10/11/2019 Tumor Marker   Patient's tumor was tested for the following markers: CA-125 Results of the tumor marker test revealed 19.6.   10/14/2019 -  Chemotherapy   The patient had carboplatin and gemzar for chemotherapy treatment.     11/04/2019 Tumor Marker   Patient's tumor was tested for the following markers: CA-125 Results of the tumor marker test revealed 11.4   11/28/2019 Tumor Marker   Patient's tumor was tested for the following markers: CA-125 Results of the tumor marker test revealed 5.8   12/20/2019 Imaging   1. Significant interval reduction in mixed solid and cystic nodule in the vicinity of  the left ovary, with significant improvement or resolution of multiple pelvic, peritoneal, and organ capsule implants. Resolution of previously seen small volume ascites. Findings are consistent with treatment response of abdominal metastatic disease. 2. Status post hysterectomy, oophorectomy, and omentectomy. 3. Nonobstructive left nephrolithiasis. 4. Aortic Atherosclerosis (ICD10-I70.0).   12/23/2019 Tumor Marker   Patient's tumor was tested for the following markers: CA-125 Results of the tumor marker test revealed 5.0   01/24/2020 Tumor Marker   Patient's tumor was tested for the following markers: CA-125 Results of the tumor marker test revealed 4.8   02/24/2020 Tumor Marker   Patient's tumor was tested for the following markers: CA-125. Results of the tumor marker test revealed 4.3.   03/06/2020 Imaging   1. Status post hysterectomy, bilateral oophorectomy, and omentectomy. 2. Resolution of previously described left adnexal nodularity. 3. No residual disease identified. 4.  Aortic Atherosclerosis (ICD10-I70.0). 5. Left nephrolithiasis.   04/20/2020 Tumor Marker   Patient's tumor was tested for the following markers: CA-125 Results of the tumor marker test revealed 4.6   05/18/2020 Tumor Marker   Patient's tumor was tested for the following markers: CA-125 Results of the tumor marker test revealed 4.7   05/29/2020 Imaging   Status post hysterectomy and bilateral salpingo oophorectomy.   No evidence of recurrent or metastatic disease.   06/08/2020 - 06/08/2020 Chemotherapy   She is taking niraparib        06/17/2020 Tumor Marker   Patient's tumor was tested for the following markers: CA-125 Results of the tumor marker test revealed 4.5   08/04/2020 Tumor Marker   Patient's tumor was tested for the following markers: CA-125. Results of the tumor marker test revealed 5.7   09/07/2020 Imaging   1. Status post hysterectomy and bilateral salpingo oophorectomy. No evidence of  recurrent or  metastatic disease. 2. Nonobstructing left nephrolithiasis. 3. Left-sided colonic diverticulosis without findings of acute diverticulitis. 4. Aortic atherosclerosis.     09/07/2020 Tumor Marker   Patient's tumor was tested for the following markers: CA-125 Results of the tumor marker test revealed 4.8.   10/21/2020 Tumor Marker   Patient's tumor was tested for the following markers: CA-125. Results of the tumor marker test revealed 5.1.     REVIEW OF SYSTEMS:   Constitutional: Denies fevers, chills or abnormal weight loss Eyes: Denies blurriness of vision Ears, nose, mouth, throat, and face: Denies mucositis or sore throat Respiratory: Denies cough, dyspnea or wheezes Cardiovascular: Denies palpitation, chest discomfort or lower extremity swelling Gastrointestinal:  Denies nausea, heartburn or change in bowel habits Skin: Denies abnormal skin rashes Lymphatics: Denies new lymphadenopathy or easy bruising Neurological:Denies numbness, tingling or new weaknesses Behavioral/Psych: Mood is stable, no new changes  All other systems were reviewed with the patient and are negative.  I have reviewed the past medical history, past surgical history, social history and family history with the patient and they are unchanged from previous note.  ALLERGIES:  is allergic to skin adhesives [cyanoacrylate], shrimp [shellfish allergy], and sulfonamide derivatives.  MEDICATIONS:  Current Outpatient Medications  Medication Sig Dispense Refill   ALPRAZolam (XANAX) 0.25 MG tablet TAKE 1 TABLET BY MOUTH EVERY NIGHT AT BEDTIME AS NEEDED FOR ANXIETY 30 tablet 2   carboxymethylcellulose (REFRESH PLUS) 0.5 % SOLN Place 1 drop into both eyes 2 (two) times daily as needed (dry eyes).     Cholecalciferol (VITAMIN D3) 25 MCG (1000 UT) CAPS Take 1,000 Units by mouth daily.      esomeprazole (NEXIUM) 20 MG capsule Take 20 mg by mouth daily.      estradiol (ESTRACE VAGINAL) 0.1 MG/GM vaginal cream  Place 1 Applicatorful vaginally 3 (three) times a week. 42.5 g 12   lidocaine-prilocaine (EMLA) cream APPLY 1 APPLICATION TOPICALLY AS NEEDED. 30 g 11   loratadine (CLARITIN) 10 MG tablet Take 10 mg by mouth daily as needed for allergies.     Multiple Vitamin (MULTIVITAMIN WITH MINERALS) TABS tablet Take 1 tablet by mouth daily.     niraparib tosylate (ZEJULA) 100 MG capsule TAKE 1 CAPSULE (100 MG TOTAL) BY MOUTH DAILY. MAY TAKE AT BEDTIME TO REDUCE NAUSEA AND VOMITING. 30 capsule 11   ondansetron (ZOFRAN) 8 MG tablet TAKE 1 TABLET BY MOUTH EVERY 8 HOURS AS NEEDED 30 tablet 1   prochlorperazine (COMPAZINE) 10 MG tablet TAKE 1 TABLET BY MOUTH EVERY 6 HOURS AS NEEDED FOR NAUSEA OR VOMITING 30 tablet 2   No current facility-administered medications for this visit.    PHYSICAL EXAMINATION: ECOG PERFORMANCE STATUS: 1 - Symptomatic but completely ambulatory  Vitals:   12/14/20 0809  BP: (!) 164/83  Pulse: 85  Resp: 18  Temp: (!) 97.4 F (36.3 C)  SpO2: 100%   Filed Weights   12/14/20 0809  Weight: 157 lb 3.2 oz (71.3 kg)    GENERAL:alert, no distress and comfortable SKIN: skin color, texture, turgor are normal, no rashes or significant lesions EYES: normal, Conjunctiva are pink and non-injected, sclera clear OROPHARYNX:no exudate, no erythema and lips, buccal mucosa, and tongue normal  NECK: supple, thyroid normal size, non-tender, without nodularity LYMPH:  no palpable lymphadenopathy in the cervical, axillary or inguinal LUNGS: clear to auscultation and percussion with normal breathing effort HEART: regular rate & rhythm and no murmurs and no lower extremity edema ABDOMEN:abdomen soft, non-tender and normal bowel sounds Musculoskeletal:no cyanosis of  digits and no clubbing  NEURO: alert & oriented x 3 with fluent speech, no focal motor/sensory deficits  LABORATORY DATA:  I have reviewed the data as listed    Component Value Date/Time   NA 140 12/14/2020 0743   K 4.0  12/14/2020 0743   CL 105 12/14/2020 0743   CO2 26 12/14/2020 0743   GLUCOSE 105 (H) 12/14/2020 0743   BUN 12 12/14/2020 0743   CREATININE 0.72 12/14/2020 0743   CREATININE 0.75 06/24/2020 1413   CALCIUM 9.2 12/14/2020 0743   PROT 6.5 12/14/2020 0743   ALBUMIN 3.9 12/14/2020 0743   AST 38 12/14/2020 0743   AST 31 06/24/2020 1413   ALT 29 12/14/2020 0743   ALT 23 06/24/2020 1413   ALKPHOS 83 12/14/2020 0743   BILITOT 2.2 (H) 12/14/2020 0743   BILITOT 1.9 (H) 06/24/2020 1413   GFRNONAA >60 12/14/2020 0743   GFRNONAA >60 06/24/2020 1413   GFRAA >60 02/24/2020 0852    No results found for: SPEP, UPEP  Lab Results  Component Value Date   WBC 4.5 12/14/2020   NEUTROABS 2.7 12/14/2020   HGB 13.6 12/14/2020   HCT 38.0 12/14/2020   MCV 92.7 12/14/2020   PLT 210 12/14/2020      Chemistry      Component Value Date/Time   NA 140 12/14/2020 0743   K 4.0 12/14/2020 0743   CL 105 12/14/2020 0743   CO2 26 12/14/2020 0743   BUN 12 12/14/2020 0743   CREATININE 0.72 12/14/2020 0743   CREATININE 0.75 06/24/2020 1413      Component Value Date/Time   CALCIUM 9.2 12/14/2020 0743   ALKPHOS 83 12/14/2020 0743   AST 38 12/14/2020 0743   AST 31 06/24/2020 1413   ALT 29 12/14/2020 0743   ALT 23 06/24/2020 1413   BILITOT 2.2 (H) 12/14/2020 0743   BILITOT 1.9 (H) 06/24/2020 1413

## 2020-12-14 NOTE — Assessment & Plan Note (Signed)
She is able to cope with anxiety better with reduced dose niraparib She has prescription alprazolam to take as needed I suspect her anxiety is the cause of her elevated blood pressure today

## 2020-12-14 NOTE — Assessment & Plan Note (Signed)
She has chronic elevated bilirubin Observe closely for now

## 2020-12-15 ENCOUNTER — Telehealth: Payer: Self-pay

## 2020-12-15 LAB — CA 125: Cancer Antigen (CA) 125: 5.5 U/mL (ref 0.0–38.1)

## 2020-12-15 NOTE — Telephone Encounter (Signed)
Called and given below message. She verbalized understanding. 

## 2020-12-15 NOTE — Telephone Encounter (Signed)
-----   Message from [name] Lark, MD sent at 12/15/2020 10:47 AM EDT ----- Pls let her know CA-125 is normal

## 2021-01-11 ENCOUNTER — Other Ambulatory Visit (HOSPITAL_COMMUNITY): Payer: Self-pay

## 2021-01-11 MED FILL — Niraparib Tosylate Cap 100 MG (Base Equivalent): ORAL | 30 days supply | Qty: 30 | Fill #4 | Status: AC

## 2021-02-03 ENCOUNTER — Other Ambulatory Visit (HOSPITAL_COMMUNITY): Payer: Self-pay

## 2021-02-03 MED FILL — Niraparib Tosylate Cap 100 MG (Base Equivalent): ORAL | 30 days supply | Qty: 30 | Fill #5 | Status: AC

## 2021-02-08 ENCOUNTER — Other Ambulatory Visit (HOSPITAL_COMMUNITY): Payer: Self-pay

## 2021-02-08 ENCOUNTER — Inpatient Hospital Stay: Payer: Managed Care, Other (non HMO) | Attending: Gynecologic Oncology

## 2021-02-08 ENCOUNTER — Other Ambulatory Visit: Payer: Self-pay

## 2021-02-08 ENCOUNTER — Inpatient Hospital Stay (HOSPITAL_BASED_OUTPATIENT_CLINIC_OR_DEPARTMENT_OTHER): Payer: Managed Care, Other (non HMO) | Admitting: Hematology and Oncology

## 2021-02-08 ENCOUNTER — Encounter: Payer: Self-pay | Admitting: Hematology and Oncology

## 2021-02-08 DIAGNOSIS — R748 Abnormal levels of other serum enzymes: Secondary | ICD-10-CM

## 2021-02-08 DIAGNOSIS — C562 Malignant neoplasm of left ovary: Secondary | ICD-10-CM | POA: Insufficient documentation

## 2021-02-08 DIAGNOSIS — Z299 Encounter for prophylactic measures, unspecified: Secondary | ICD-10-CM

## 2021-02-08 DIAGNOSIS — Z9221 Personal history of antineoplastic chemotherapy: Secondary | ICD-10-CM | POA: Insufficient documentation

## 2021-02-08 DIAGNOSIS — Z7189 Other specified counseling: Secondary | ICD-10-CM

## 2021-02-08 DIAGNOSIS — C786 Secondary malignant neoplasm of retroperitoneum and peritoneum: Secondary | ICD-10-CM | POA: Diagnosis not present

## 2021-02-08 DIAGNOSIS — F411 Generalized anxiety disorder: Secondary | ICD-10-CM | POA: Diagnosis not present

## 2021-02-08 LAB — CBC WITH DIFFERENTIAL/PLATELET
Abs Immature Granulocytes: 0.01 10*3/uL (ref 0.00–0.07)
Basophils Absolute: 0 10*3/uL (ref 0.0–0.1)
Basophils Relative: 1 %
Eosinophils Absolute: 0.1 10*3/uL (ref 0.0–0.5)
Eosinophils Relative: 1 %
HCT: 38.9 % (ref 36.0–46.0)
Hemoglobin: 13.6 g/dL (ref 12.0–15.0)
Immature Granulocytes: 0 %
Lymphocytes Relative: 25 %
Lymphs Abs: 1.3 10*3/uL (ref 0.7–4.0)
MCH: 33.4 pg (ref 26.0–34.0)
MCHC: 35 g/dL (ref 30.0–36.0)
MCV: 95.6 fL (ref 80.0–100.0)
Monocytes Absolute: 0.5 10*3/uL (ref 0.1–1.0)
Monocytes Relative: 9 %
Neutro Abs: 3.3 10*3/uL (ref 1.7–7.7)
Neutrophils Relative %: 64 %
Platelets: 185 10*3/uL (ref 150–400)
RBC: 4.07 MIL/uL (ref 3.87–5.11)
RDW: 12.5 % (ref 11.5–15.5)
WBC: 5.1 10*3/uL (ref 4.0–10.5)
nRBC: 0 % (ref 0.0–0.2)

## 2021-02-08 LAB — COMPREHENSIVE METABOLIC PANEL
ALT: 23 U/L (ref 0–44)
AST: 32 U/L (ref 15–41)
Albumin: 4.1 g/dL (ref 3.5–5.0)
Alkaline Phosphatase: 68 U/L (ref 38–126)
Anion gap: 10 (ref 5–15)
BUN: 9 mg/dL (ref 8–23)
CO2: 25 mmol/L (ref 22–32)
Calcium: 9.3 mg/dL (ref 8.9–10.3)
Chloride: 106 mmol/L (ref 98–111)
Creatinine, Ser: 0.66 mg/dL (ref 0.44–1.00)
GFR, Estimated: >60 mL/min (ref >60)
Glucose, Bld: 92 mg/dL (ref 70–99)
Potassium: 3.6 mmol/L (ref 3.5–5.1)
Sodium: 141 mmol/L (ref 135–145)
Total Bilirubin: 2.4 mg/dL — ABNORMAL HIGH (ref 0.3–1.2)
Total Protein: 6.6 g/dL (ref 6.5–8.1)

## 2021-02-08 MED ORDER — ALPRAZOLAM 0.25 MG PO TABS
0.2500 mg | ORAL_TABLET | Freq: Every evening | ORAL | 0 refills | Status: DC | PRN
  Filled 2021-02-08: qty 30, 30d supply, fill #0

## 2021-02-08 MED ORDER — SODIUM CHLORIDE 0.9% FLUSH
10.0000 mL | Freq: Once | INTRAVENOUS | Status: AC
  Administered 2021-02-08: 10 mL

## 2021-02-08 MED ORDER — HEPARIN SOD (PORK) LOCK FLUSH 100 UNIT/ML IV SOLN
500.0000 [IU] | Freq: Once | INTRAVENOUS | Status: AC
  Administered 2021-02-08: 500 [IU]

## 2021-02-08 NOTE — Assessment & Plan Note (Signed)
She has chronic elevated bilirubin Observe closely for now

## 2021-02-08 NOTE — Progress Notes (Signed)
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[facility]) She tolerated chemotherapy fairly well Her blood counts are within normal limits Her chronic elevated bilirubin is unrelated Her last CT imaging and tumor marker are completely normal The plan will be to continue this indefinitely In the absence of disease, I do not recommend repeating imaging study unless her tumor marker is elevated and she has signs and symptoms to suggest cancer recurrence I will see her every other month She will continue niraparib as prescribed  Elevated liver enzymes She has chronic elevated bilirubin Observe closely for now  Preventive measure We discussed the importance of influenza vaccination  Anxiety state I sent prescription for Xanax Her blood pressure is elevated today likely due to anxiety We discussed conservative approach I also recommend the patient to start checking blood pressure at home  No orders of the defined types were placed in this encounter.   All questions were answered. The patient knows to call the clinic with any problems, questions or concerns. The total time spent in the appointment was 20 minutes encounter with patients including review of chart and various tests results, discussions about plan of care and coordination of care plan   [name] Lark, MD 02/08/2021 8:54 AM  INTERVAL HISTORY: Please see below for problem oriented charting. she returns for treatment follow-up on niraparib for ovarian cancer She complained of mild fatigue She has not been checking her blood pressure at home She has intermittent bouts of anxiety and is thinking about taking a supplement She was able to lose some weight since last time I saw her Denies abdominal pain,  nausea, changes in bowel habits or bloating  REVIEW OF SYSTEMS:   Constitutional: Denies fevers, chills or abnormal weight loss Eyes: Denies blurriness of vision Ears, nose, mouth, throat, and face: Denies mucositis or sore throat Respiratory: Denies cough, dyspnea or wheezes Cardiovascular: Denies palpitation, chest discomfort or lower extremity swelling Gastrointestinal:  Denies nausea, heartburn or change in bowel habits Skin: Denies abnormal skin rashes Lymphatics: Denies new lymphadenopathy or easy bruising Neurological:Denies numbness, tingling or new weaknesses Behavioral/Psych: Mood is stable, no new changes  All other systems were reviewed with the patient and are negative.  I have reviewed the past medical history, past surgical history, social history and family history with the patient and they are unchanged from previous note.  ALLERGIES:  is allergic to skin adhesives [cyanoacrylate], shrimp [shellfish allergy], and sulfonamide derivatives.  MEDICATIONS:  Current Outpatient Medications  Medication Sig Dispense Refill   ALPRAZolam (XANAX) 0.25 MG tablet Take 1 tablet (0.25 mg total) by mouth at bedtime as needed for anxiety. 30 tablet 0   carboxymethylcellulose (REFRESH PLUS) 0.5 % SOLN Place 1 drop into both eyes 2 (two) times daily as needed (dry eyes).     Cholecalciferol (VITAMIN D3) 25 MCG (1000 UT) CAPS Take 1,000 Units by mouth daily.      esomeprazole (NEXIUM) 20 MG capsule Take 20 mg by mouth daily.      estradiol (ESTRACE VAGINAL) 0.1 MG/GM vaginal cream Place 1 Applicatorful vaginally 3 (three) times a week. 42.5 g 12   loratadine (CLARITIN) 10 MG tablet Take 10 mg by mouth daily as needed for allergies.     Multiple Vitamin (MULTIVITAMIN WITH MINERALS) TABS tablet Take 1  tablet by mouth daily.     niraparib tosylate (ZEJULA) 100 MG capsule TAKE 1 CAPSULE (100 MG TOTAL) BY MOUTH DAILY. MAY TAKE AT BEDTIME TO REDUCE NAUSEA AND VOMITING. 30 capsule 11   ondansetron  (ZOFRAN) 8 MG tablet TAKE 1 TABLET BY MOUTH EVERY 8 HOURS AS NEEDED 30 tablet 1   prochlorperazine (COMPAZINE) 10 MG tablet TAKE 1 TABLET BY MOUTH EVERY 6 HOURS AS NEEDED FOR NAUSEA OR VOMITING 30 tablet 2   No current facility-administered medications for this visit.    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 14.7 cm poorly defined soft tissue mass in central pelvis suspicious for primary ovarian carcinoma, with uterine leiomyosarcoma considered a less likely differential diagnosis. 2. Moderate ascites and diffuse peritoneal carcinomatosis. 3. Several small uterine fibroids.   11/13/2018 Tumor Marker   Patient's tumor was tested for the following markers: CA-125 Results of the tumor marker test revealed 1434   11/21/2018 Procedure   Successful ultrasound-guided diagnostic and therapeutic paracentesis yielding 3.1 liters of peritoneal fluid   11/21/2018 Pathology Results   PERITONEAL/ASCITIC FLUID(SPECIMEN 1 OF 1 COLLECTED 11/21/18): ADENOCARCINOMA. Specimen Clinical Information Pelvic mass suspicious for ovarian cancer Source Peritoneal/Ascitic Fluid, (specimen 1 of 1 collected 11/21/18) Gross Specimen: Received is/are 1000 ccs of dark amber fluid. (CM:cm) Prepared: # Smears: 0 # Concentration Technique Slides (i.e. 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   11/29/2018 Initial Diagnosis   Ovarian cancer ([place])   11/29/2018 Cancer Staging   Staging form: Ovary, Fallopian Tube, and Primary Peritoneal Carcinoma, AJCC 8th Edition - Clinical: cT3, cN0, cM0 - Signed by [name] Lark, MD on 11/29/2018   12/03/2018 Procedure   Successful ultrasound-guided therapeutic paracentesis yielding 3.7 liters of peritoneal fluid.     12/06/2018 Procedure   Placement of a  subcutaneous port device. Catheter tip at the SVC and right atrium junction   12/14/2018 Procedure   Successful ultrasound-guided paracentesis yielding 2.9 L of peritoneal fluid   12/28/2018 Tumor Marker   Patient's tumor was tested for the following markers: CA-125 Results of the tumor marker test revealed 812.   12/28/2018 - 04/22/2019 Chemotherapy   The patient had carboplatin and taxol for neoadjuvant treatment, followed by interval debulking surgery and subsequent adjuvant chemotherapy treatment.     02/01/2019 Imaging   Ct abdomen and pelvis 8.6 cm left ovarian mass, corresponding to the patient's known primary neoplasm, improved.   Mild peritoneal nodularity/omental caking, improved.   Small abdominopelvic ascites, improved.     02/01/2019 Tumor Marker   Patient's tumor was tested for the following markers: CA-125 Results of the tumor marker test revealed 183.   02/12/2019 Surgery   Preoperative Diagnosis: Stage IIIC ovarian cancer, s/p neoadjuvant chemotherapy      Procedure(s) Performed: 1. Exploratory laparotomy with total abdominal hysterectomy, bilateral salpingo-oophorectomy, omentectomy radical tumor debulking for ovarian cancer.   Surgeon: Thereasa Solo, MD.    Operative Findings: upper abdomen free of disease. No visible omental disease. Small volume (200cc) ascites. 8cm friable mass replacing left ovary and adherent to the sigmoid colon mesentery and ureter on the left.  Anterior fibroid. This represented an optimal cytoreduction (R0) with no gross visible disease remaining.    02/12/2019 Pathology Results   A. OVARY AND FALLOPIAN TUBE, LEFT, SALPINGO-OOPHORECTOMY: - Serous carcinoma, high  grade, status post neoadjuvant therapy - See oncology table and comment below B. UTERUS CERVIX WITH RIGHT FALLOPIAN TUBE AND OVARY, HYSTERECTOMY: Uterus: - Serosal surface involved by serous carcinoma - Endomyometrium uninvolved by carcinoma - Benign endometrial polyp (4.1 cm) -  Leiomyomata (5.5 cm; largest) - Adenomyosis Cervix: - Uninvolved by carcinoma Left ovary: - Serous carcinoma, high grade Left fallopian tube: - Serous carcinoma, high grade C. SOFT TISSUE, LEFT PELVIC SIDEWALL TUMOR, EXCISION: - Metastatic serous carcinoma, high-grade D. SOFT TISSUE, SIGMOID COLON MESENTERY, EXCISION: - Metastatic serous carcinoma, high-grade E. OMENTUM, TUMOR RESECTION: - Metastatic serous carcinoma, high-grade OVARY or FALLOPIAN TUBE or PRIMARY PERITONEUM: Procedure: Total hysterectomy and bilateral salpingo-oophorectomy, omentectomy and peritoneal biopsies Specimen Integrity: Fragmented Tumor Site: Left ovary and fallopian tube Ovarian Surface Involvement: Present Fallopian Tube Surface Involvement: Present Tumor Size: 6.3 cm (see comment) Histologic Type: Serous carcinoma Histologic Grade: High-grade Implants: Not applicable Other Tissue/ Organ Involvement: Right ovary, right fallopian tube, omentum, mesentery Largest Extrapelvic Peritoneal Focus: Macroscopic Peritoneal/Ascitic Fluid: Malignant Treatment Effect: No definite or minimal response identified (chemotherapy response score 1 [CRS1] Regional Lymph Nodes: No lymph nodes submitted or found Pathologic Stage Classification (pTNM, AJCC 8th Edition): pT3c, pNX Representative Tumor Block: A4 Comment(s): Additional testing (HER-2, MMR and MSI) are pending. The primary tumor site appears to be the left ovary and fallopian tube. The uterus is only involved on the serosal surface. There is tumor on the anterior peritoneal reflection.  Addendum: Tumor is Her2 negative,MSI stable   02/19/2019 Tumor Marker   Patient's tumor was tested for the following markers: CA-125 Results of the tumor marker test revealed 40.3   04/01/2019 Tumor Marker   Patient's tumor was tested for the following markers: CA-125 Results of the tumor marker test revealed 8.1    Genetic Testing   Negative testing. No pathogenic variants  identified on the [employer]. The report date is 04/17/2019.  Somatic genes analyzed through [facility]: ATM, BARD1, BRCA1, BRCA2, BRIP1, CHEK2, MRE11A, NBN, PALB2, RAD51C, RAD51D.  The [facility] gene panel offered by [employer] includes sequencing and rearrangement analysis for the following 36 genes: APC*, ATM*, AXIN2, BARD1, BMPR1A, BRCA1*, BRCA2*, BRIP1*, CDH1*, CDK4, CDKN2A, CHEK2*, DICER1, MLH1*, MSH2*, MSH3, MSH6*, MUTYH*, NBN, NF1*, NTHL1, PALB2*, PMS2*, PTEN*, RAD51C*, RAD51D*, RECQL, SMAD4, SMARCA4, STK11 and TP53* (sequencing and deletion/duplication); HOXB13, POLD1 and POLE (sequencing only); EPCAM and GREM1 (deletion/duplication only).    05/21/2019 Imaging   1. Interval hysterectomy, oophorectomy and omentectomy. No evidence of residual ovarian carcinoma in the pelvis. No evidence of peritoneal disease. No intraperitoneal free fluid. 2. Nonobstructing LEFT renal calculi.  Normal ureters.   05/22/2019 Tumor Marker   Patient's tumor was tested for the following markers: CA-125 Results of the tumor marker test revealed 5.7.   06/03/2019 Procedure   Successful right IJ vein Port-A-Cath explant.   08/26/2019 Tumor Marker   Patient's tumor was tested for the following markers: CA-125. Results of the tumor marker test revealed 4.9   10/03/2019 Imaging   1. New tumor deposits along the capsular surfaces of the liver, spleen, in the left pelvis, and right paracolic gutter, compatible with metastatic disease. 2. Mild dilation of the dorsal pancreatic duct in the pancreatic body and head, cause uncertain. 3. Nonobstructive left nephrolithiasis. 4. Aortic atherosclerosis.   Aortic Atherosclerosis (ICD10-I70.0   10/10/2019 Procedure   Successful placement of a right IJ approach Power Port with ultrasound and fluoroscopic guidance. The catheter is ready for use.     10/11/2019 Tumor Marker  Patient's tumor was tested for the following markers:  CA-125 Results of the tumor marker test revealed 19.6.   10/14/2019 -  Chemotherapy   The patient had carboplatin and gemzar for chemotherapy treatment.     11/04/2019 Tumor Marker   Patient's tumor was tested for the following markers: CA-125 Results of the tumor marker test revealed 11.4   11/28/2019 Tumor Marker   Patient's tumor was tested for the following markers: CA-125 Results of the tumor marker test revealed 5.8   12/20/2019 Imaging   1. Significant interval reduction in mixed solid and cystic nodule in the vicinity of the left ovary, with significant improvement or resolution of multiple pelvic, peritoneal, and organ capsule implants. Resolution of previously seen small volume ascites. Findings are consistent with treatment response of abdominal metastatic disease. 2. Status post hysterectomy, oophorectomy, and omentectomy. 3. Nonobstructive left nephrolithiasis. 4. Aortic Atherosclerosis (ICD10-I70.0).   12/23/2019 Tumor Marker   Patient's tumor was tested for the following markers: CA-125 Results of the tumor marker test revealed 5.0   01/24/2020 Tumor Marker   Patient's tumor was tested for the following markers: CA-125 Results of the tumor marker test revealed 4.8   02/24/2020 Tumor Marker   Patient's tumor was tested for the following markers: CA-125. Results of the tumor marker test revealed 4.3.   03/06/2020 Imaging   1. Status post hysterectomy, bilateral oophorectomy, and omentectomy. 2. Resolution of previously described left adnexal nodularity. 3. No residual disease identified. 4.  Aortic Atherosclerosis (ICD10-I70.0). 5. Left nephrolithiasis.   04/20/2020 Tumor Marker   Patient's tumor was tested for the following markers: CA-125 Results of the tumor marker test revealed 4.6   05/18/2020 Tumor Marker   Patient's tumor was tested for the following markers: CA-125 Results of the tumor marker test revealed 4.7   05/29/2020 Imaging   Status post hysterectomy  and bilateral salpingo oophorectomy.   No evidence of recurrent or metastatic disease.   06/08/2020 - 06/08/2020 Chemotherapy   She is taking niraparib        06/17/2020 Tumor Marker   Patient's tumor was tested for the following markers: CA-125 Results of the tumor marker test revealed 4.5   08/04/2020 Tumor Marker   Patient's tumor was tested for the following markers: CA-125. Results of the tumor marker test revealed 5.7   09/07/2020 Imaging   1. Status post hysterectomy and bilateral salpingo oophorectomy. No evidence of recurrent or metastatic disease. 2. Nonobstructing left nephrolithiasis. 3. Left-sided colonic diverticulosis without findings of acute diverticulitis. 4. Aortic atherosclerosis.     09/07/2020 Tumor Marker   Patient's tumor was tested for the following markers: CA-125 Results of the tumor marker test revealed 4.8.   10/21/2020 Tumor Marker   Patient's tumor was tested for the following markers: CA-125. Results of the tumor marker test revealed 5.1.   12/14/2020 Tumor Marker   Patient's tumor was tested for the following markers: CA-125. Results of the tumor marker test revealed 5.5.     PHYSICAL EXAMINATION: ECOG PERFORMANCE STATUS: 1 - Symptomatic but completely ambulatory  Vitals:   02/08/21 0816  BP: (!) 165/80  Pulse: 81  Resp: 18  Temp: 97.7 F (36.5 C)  SpO2: 100%   Filed Weights   02/08/21 0816  Weight: 153 lb (69.4 kg)    GENERAL:alert, no distress and comfortable SKIN: skin color, texture, turgor are normal, no rashes or significant lesions EYES: normal, Conjunctiva are pink and non-injected, sclera clear OROPHARYNX:no exudate, no erythema and lips, buccal mucosa, and tongue  normal  NECK: supple, thyroid normal size, non-tender, without nodularity LYMPH:  no palpable lymphadenopathy in the cervical, axillary or inguinal LUNGS: clear to auscultation and percussion with normal breathing effort HEART: regular rate & rhythm and no  murmurs and no lower extremity edema ABDOMEN:abdomen soft, non-tender and normal bowel sounds Musculoskeletal:no cyanosis of digits and no clubbing  NEURO: alert & oriented x 3 with fluent speech, no focal motor/sensory deficits  LABORATORY DATA:  I have reviewed the data as listed    Component Value Date/Time   NA 141 02/08/2021 0745   K 3.6 02/08/2021 0745   CL 106 02/08/2021 0745   CO2 25 02/08/2021 0745   GLUCOSE 92 02/08/2021 0745   BUN 9 02/08/2021 0745   CREATININE 0.66 02/08/2021 0745   CREATININE 0.75 06/24/2020 1413   CALCIUM 9.3 02/08/2021 0745   PROT 6.6 02/08/2021 0745   ALBUMIN 4.1 02/08/2021 0745   AST 32 02/08/2021 0745   AST 31 06/24/2020 1413   ALT 23 02/08/2021 0745   ALT 23 06/24/2020 1413   ALKPHOS 68 02/08/2021 0745   BILITOT 2.4 (H) 02/08/2021 0745   BILITOT 1.9 (H) 06/24/2020 1413   GFRNONAA >60 02/08/2021 0745   GFRNONAA >60 06/24/2020 1413   GFRAA >60 02/24/2020 0852    No results found for: SPEP, UPEP  Lab Results  Component Value Date   WBC 5.1 02/08/2021   NEUTROABS 3.3 02/08/2021   HGB 13.6 02/08/2021   HCT 38.9 02/08/2021   MCV 95.6 02/08/2021   PLT 185 02/08/2021      Chemistry      Component Value Date/Time   NA 141 02/08/2021 0745   K 3.6 02/08/2021 0745   CL 106 02/08/2021 0745   CO2 25 02/08/2021 0745   BUN 9 02/08/2021 0745   CREATININE 0.66 02/08/2021 0745   CREATININE 0.75 06/24/2020 1413      Component Value Date/Time   CALCIUM 9.3 02/08/2021 0745   ALKPHOS 68 02/08/2021 0745   AST 32 02/08/2021 0745   AST 31 06/24/2020 1413   ALT 23 02/08/2021 0745   ALT 23 06/24/2020 1413   BILITOT 2.4 (H) 02/08/2021 0745   BILITOT 1.9 (H) 06/24/2020 1413

## 2021-02-08 NOTE — Assessment & Plan Note (Signed)
I sent prescription for Xanax Her blood pressure is elevated today likely due to anxiety We discussed conservative approach I also recommend the patient to start checking blood pressure at home

## 2021-02-08 NOTE — Assessment & Plan Note (Signed)
We discussed the importance of influenza vaccination

## 2021-02-08 NOTE — Assessment & Plan Note (Signed)
She tolerated chemotherapy fairly well Her blood counts are within normal limits Her chronic elevated bilirubin is unrelated Her last CT imaging and tumor marker are completely normal The plan will be to continue this indefinitely In the absence of disease, I do not recommend repeating imaging study unless her tumor marker is elevated and she has signs and symptoms to suggest cancer recurrence I will see her every other month She will continue niraparib as prescribed

## 2021-02-09 ENCOUNTER — Telehealth: Payer: Self-pay | Admitting: Oncology

## 2021-02-09 LAB — CA 125: Cancer Antigen (CA) 125: 4.6 U/mL (ref 0.0–38.1)

## 2021-02-09 NOTE — Telephone Encounter (Signed)
Called [facility] and advised her of CA 125 results.  She verbalized understanding and agreement.

## 2021-03-03 ENCOUNTER — Other Ambulatory Visit (HOSPITAL_COMMUNITY): Payer: Self-pay

## 2021-03-15 ENCOUNTER — Other Ambulatory Visit (HOSPITAL_COMMUNITY): Payer: Self-pay

## 2021-03-15 MED FILL — Niraparib Tosylate Cap 100 MG (Base Equivalent): ORAL | 30 days supply | Qty: 30 | Fill #6 | Status: AC

## 2021-04-05 ENCOUNTER — Inpatient Hospital Stay (HOSPITAL_BASED_OUTPATIENT_CLINIC_OR_DEPARTMENT_OTHER): Payer: Managed Care, Other (non HMO) | Admitting: Hematology and Oncology

## 2021-04-05 ENCOUNTER — Inpatient Hospital Stay: Payer: Managed Care, Other (non HMO) | Attending: Gynecologic Oncology

## 2021-04-05 ENCOUNTER — Encounter: Payer: Self-pay | Admitting: Hematology and Oncology

## 2021-04-05 ENCOUNTER — Other Ambulatory Visit: Payer: Self-pay

## 2021-04-05 VITALS — BP 178/80 | HR 84 | Temp 97.6°F | Resp 18 | Ht 62.0 in | Wt 154.2 lb

## 2021-04-05 DIAGNOSIS — C785 Secondary malignant neoplasm of large intestine and rectum: Secondary | ICD-10-CM | POA: Insufficient documentation

## 2021-04-05 DIAGNOSIS — F411 Generalized anxiety disorder: Secondary | ICD-10-CM

## 2021-04-05 DIAGNOSIS — M6289 Other specified disorders of muscle: Secondary | ICD-10-CM

## 2021-04-05 DIAGNOSIS — Z7189 Other specified counseling: Secondary | ICD-10-CM

## 2021-04-05 DIAGNOSIS — Z90722 Acquired absence of ovaries, bilateral: Secondary | ICD-10-CM | POA: Diagnosis not present

## 2021-04-05 DIAGNOSIS — Z79899 Other long term (current) drug therapy: Secondary | ICD-10-CM | POA: Insufficient documentation

## 2021-04-05 DIAGNOSIS — C562 Malignant neoplasm of left ovary: Secondary | ICD-10-CM

## 2021-04-05 DIAGNOSIS — C786 Secondary malignant neoplasm of retroperitoneum and peritoneum: Secondary | ICD-10-CM

## 2021-04-05 LAB — CBC WITH DIFFERENTIAL/PLATELET
Abs Immature Granulocytes: 0.02 10*3/uL (ref 0.00–0.07)
Basophils Absolute: 0 10*3/uL (ref 0.0–0.1)
Basophils Relative: 0 %
Eosinophils Absolute: 0 10*3/uL (ref 0.0–0.5)
Eosinophils Relative: 1 %
HCT: 40.4 % (ref 36.0–46.0)
Hemoglobin: 14 g/dL (ref 12.0–15.0)
Immature Granulocytes: 0 %
Lymphocytes Relative: 18 %
Lymphs Abs: 1 10*3/uL (ref 0.7–4.0)
MCH: 33.1 pg (ref 26.0–34.0)
MCHC: 34.7 g/dL (ref 30.0–36.0)
MCV: 95.5 fL (ref 80.0–100.0)
Monocytes Absolute: 0.4 10*3/uL (ref 0.1–1.0)
Monocytes Relative: 6 %
Neutro Abs: 4.3 10*3/uL (ref 1.7–7.7)
Neutrophils Relative %: 75 %
Platelets: 217 10*3/uL (ref 150–400)
RBC: 4.23 MIL/uL (ref 3.87–5.11)
RDW: 12.2 % (ref 11.5–15.5)
WBC: 5.8 10*3/uL (ref 4.0–10.5)
nRBC: 0 % (ref 0.0–0.2)

## 2021-04-05 LAB — COMPREHENSIVE METABOLIC PANEL
ALT: 26 U/L (ref 0–44)
AST: 35 U/L (ref 15–41)
Albumin: 4.1 g/dL (ref 3.5–5.0)
Alkaline Phosphatase: 88 U/L (ref 38–126)
Anion gap: 10 (ref 5–15)
BUN: 11 mg/dL (ref 8–23)
CO2: 23 mmol/L (ref 22–32)
Calcium: 8.8 mg/dL — ABNORMAL LOW (ref 8.9–10.3)
Chloride: 107 mmol/L (ref 98–111)
Creatinine, Ser: 0.65 mg/dL (ref 0.44–1.00)
GFR, Estimated: >60 mL/min (ref >60)
Glucose, Bld: 114 mg/dL — ABNORMAL HIGH (ref 70–99)
Potassium: 3.8 mmol/L (ref 3.5–5.1)
Sodium: 140 mmol/L (ref 135–145)
Total Bilirubin: 2.2 mg/dL — ABNORMAL HIGH (ref 0.3–1.2)
Total Protein: 6.7 g/dL (ref 6.5–8.1)

## 2021-04-05 MED ORDER — HEPARIN SOD (PORK) LOCK FLUSH 100 UNIT/ML IV SOLN
500.0000 [IU] | Freq: Once | INTRAVENOUS | Status: AC
  Administered 2021-04-05: 500 [IU]

## 2021-04-05 MED ORDER — SODIUM CHLORIDE 0.9% FLUSH
10.0000 mL | Freq: Once | INTRAVENOUS | Status: AC
  Administered 2021-04-05: 10 mL

## 2021-04-05 NOTE — Assessment & Plan Note (Signed)
Her blood pressure is elevated today likely due to anxiety We discussed conservative approach I also recommend the patient to start checking blood pressure at home

## 2021-04-05 NOTE — Assessment & Plan Note (Signed)
She requests referral to see physical therapist for pelvic floor dysfunction Will send referral

## 2021-04-05 NOTE — Assessment & Plan Note (Signed)
She tolerated chemotherapy fairly well Her blood counts are within normal limits Her chronic elevated bilirubin is unrelated Her last CT imaging and tumor marker are completely normal The plan will be to continue this indefinitely In the absence of disease, I do not recommend repeating imaging study unless her tumor marker is elevated and she has signs and symptoms to suggest cancer recurrence I will see her every other month She will continue niraparib as prescribed

## 2021-04-05 NOTE — Progress Notes (Signed)
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[facility]) She tolerated chemotherapy fairly well Her blood counts are within normal limits Her chronic elevated bilirubin is unrelated Her last CT imaging and tumor marker are completely normal The plan will be to continue this indefinitely In the absence of disease, I do not recommend repeating imaging study unless her tumor marker is elevated and she has signs and symptoms to suggest cancer recurrence I will see her every other month She will continue niraparib as prescribed  Pelvic floor dysfunction in female She requests referral to see physical therapist for pelvic floor dysfunction Will send referral  Anxiety state Her blood pressure is elevated today likely due to anxiety We discussed conservative approach I also recommend the patient to start checking blood pressure at home  Orders Placed This Encounter  Procedures   Ambulatory referral to Physical Therapy    Referral Priority:   Routine    Referral Type:   Physical Medicine    Referral Reason:   Specialty Services Required    Requested Specialty:   Physical Therapy    Number of Visits Requested:   1    All questions were answered. The patient knows to call the clinic with any problems, questions or concerns. The total time spent in the appointment was 20 minutes encounter with patients including review of chart and various tests results, discussions about plan of care and coordination of care plan   Artis Delay, MD 04/05/2021 10:40 AM  INTERVAL HISTORY: Please see below for problem oriented charting. she returns for treatment follow-up on niraparib for recurrent ovarian cancer She has no new signs or symptoms of disease progression such as  abdominal bloating, pain or changes in bowel habits  REVIEW OF SYSTEMS:   Constitutional: Denies fevers, chills or abnormal weight loss Eyes: Denies blurriness of vision Ears, nose, mouth, throat, and face: Denies mucositis or sore throat Respiratory: Denies cough, dyspnea or wheezes Cardiovascular: Denies palpitation, chest discomfort or lower extremity swelling Gastrointestinal:  Denies nausea, heartburn or change in bowel habits Skin: Denies abnormal skin rashes Lymphatics: Denies new lymphadenopathy or easy bruising Neurological:Denies numbness, tingling or new weaknesses Behavioral/Psych: Mood is stable, no new changes  All other systems were reviewed with the patient and are negative.  I have reviewed the past medical history, past surgical history, social history and family history with the patient and they are unchanged from previous note.  ALLERGIES:  is allergic to skin adhesives [cyanoacrylate], shrimp [shellfish allergy], and sulfonamide derivatives.  MEDICATIONS:  Current Outpatient Medications  Medication Sig Dispense Refill   ALPRAZolam (XANAX) 0.25 MG tablet Take 1 tablet (0.25 mg total) by mouth at bedtime as needed for anxiety. 30 tablet 0   carboxymethylcellulose (REFRESH PLUS) 0.5 % SOLN Place 1 drop into both eyes 2 (two) times daily as needed (dry eyes).     Cholecalciferol (VITAMIN D3) 25 MCG (1000 UT) CAPS Take 1,000 Units by mouth daily.      esomeprazole (NEXIUM) 20 MG capsule Take 20 mg by mouth daily.      estradiol (ESTRACE VAGINAL) 0.1 MG/GM vaginal cream Place 1 Applicatorful vaginally 3 (three) times a week. 42.5 g 12   loratadine (CLARITIN) 10 MG tablet Take 10 mg by mouth daily as needed for allergies.  Multiple Vitamin (MULTIVITAMIN WITH MINERALS) TABS tablet Take 1 tablet by mouth daily.     niraparib tosylate (ZEJULA) 100 MG capsule TAKE 1 CAPSULE (100 MG TOTAL) BY MOUTH DAILY. MAY TAKE AT BEDTIME TO REDUCE NAUSEA AND VOMITING. 30 capsule 11    ondansetron (ZOFRAN) 8 MG tablet TAKE 1 TABLET BY MOUTH EVERY 8 HOURS AS NEEDED 30 tablet 1   prochlorperazine (COMPAZINE) 10 MG tablet TAKE 1 TABLET BY MOUTH EVERY 6 HOURS AS NEEDED FOR NAUSEA OR VOMITING 30 tablet 2   No current facility-administered medications for this visit.    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 14.7 cm poorly defined soft tissue mass in central pelvis suspicious for primary ovarian carcinoma, with uterine leiomyosarcoma considered a less likely differential diagnosis. 2. Moderate ascites and diffuse peritoneal carcinomatosis. 3. Several small uterine fibroids.   11/13/2018 Tumor Marker   Patient's tumor was tested for the following markers: CA-125 Results of the tumor marker test revealed 1434   11/21/2018 Procedure   Successful ultrasound-guided diagnostic and therapeutic paracentesis yielding 3.1 liters of peritoneal fluid   11/21/2018 Pathology Results   PERITONEAL/ASCITIC FLUID(SPECIMEN 1 OF 1 COLLECTED 11/21/18): ADENOCARCINOMA. Specimen Clinical Information Pelvic mass suspicious for ovarian cancer Source Peritoneal/Ascitic Fluid, (specimen 1 of 1 collected 11/21/18) Gross Specimen: Received is/are 1000 ccs of dark amber fluid. (CM:cm) Prepared: # Smears: 0 # Concentration Technique Slides (i.e. 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   11/29/2018 Initial Diagnosis   Ovarian cancer ([place])   11/29/2018 Cancer Staging   Staging form: Ovary, Fallopian Tube, and Primary Peritoneal Carcinoma, AJCC 8th Edition - Clinical: cT3, cN0, cM0 - Signed by [name] Lark, MD on 11/29/2018    12/03/2018 Procedure   Successful ultrasound-guided therapeutic paracentesis yielding 3.7 liters of peritoneal fluid.     12/06/2018 Procedure    Placement of a subcutaneous port device. Catheter tip at the SVC and right atrium junction   12/14/2018 Procedure   Successful ultrasound-guided paracentesis yielding 2.9 L of peritoneal fluid   12/28/2018 Tumor Marker   Patient's tumor was tested for the following markers: CA-125 Results of the tumor marker test revealed 812.   12/28/2018 - 04/22/2019 Chemotherapy   The patient had carboplatin and taxol for neoadjuvant treatment, followed by interval debulking surgery and subsequent adjuvant chemotherapy treatment.     02/01/2019 Imaging   Ct abdomen and pelvis 8.6 cm left ovarian mass, corresponding to the patient's known primary neoplasm, improved.   Mild peritoneal nodularity/omental caking, improved.   Small abdominopelvic ascites, improved.     02/01/2019 Tumor Marker   Patient's tumor was tested for the following markers: CA-125 Results of the tumor marker test revealed 183.   02/12/2019 Surgery   Preoperative Diagnosis: Stage IIIC ovarian cancer, s/p neoadjuvant chemotherapy      Procedure(s) Performed: 1. Exploratory laparotomy with total abdominal hysterectomy, bilateral salpingo-oophorectomy, omentectomy radical tumor debulking for ovarian cancer.   Surgeon: Thereasa Solo, MD.    Operative Findings: upper abdomen free of disease. No visible omental disease. Small volume (200cc) ascites. 8cm friable mass replacing left ovary and adherent to the sigmoid colon mesentery and ureter on the left.  Anterior fibroid. This represented an optimal cytoreduction (R0) with no gross visible disease remaining.    02/12/2019 Pathology Results   A.  OVARY AND FALLOPIAN TUBE, LEFT, SALPINGO-OOPHORECTOMY: - Serous carcinoma, high grade, status post neoadjuvant therapy - See oncology table and comment below B. UTERUS CERVIX WITH RIGHT FALLOPIAN TUBE AND OVARY, HYSTERECTOMY: Uterus: - Serosal surface involved by serous carcinoma - Endomyometrium uninvolved by carcinoma - Benign endometrial  polyp (4.1 cm) - Leiomyomata (5.5 cm; largest) - Adenomyosis Cervix: - Uninvolved by carcinoma Left ovary: - Serous carcinoma, high grade Left fallopian tube: - Serous carcinoma, high grade C. SOFT TISSUE, LEFT PELVIC SIDEWALL TUMOR, EXCISION: - Metastatic serous carcinoma, high-grade D. SOFT TISSUE, SIGMOID COLON MESENTERY, EXCISION: - Metastatic serous carcinoma, high-grade E. OMENTUM, TUMOR RESECTION: - Metastatic serous carcinoma, high-grade OVARY or FALLOPIAN TUBE or PRIMARY PERITONEUM: Procedure: Total hysterectomy and bilateral salpingo-oophorectomy, omentectomy and peritoneal biopsies Specimen Integrity: Fragmented Tumor Site: Left ovary and fallopian tube Ovarian Surface Involvement: Present Fallopian Tube Surface Involvement: Present Tumor Size: 6.3 cm (see comment) Histologic Type: Serous carcinoma Histologic Grade: High-grade Implants: Not applicable Other Tissue/ Organ Involvement: Right ovary, right fallopian tube, omentum, mesentery Largest Extrapelvic Peritoneal Focus: Macroscopic Peritoneal/Ascitic Fluid: Malignant Treatment Effect: No definite or minimal response identified (chemotherapy response score 1 [CRS1] Regional Lymph Nodes: No lymph nodes submitted or found Pathologic Stage Classification (pTNM, AJCC 8th Edition): pT3c, pNX Representative Tumor Block: A4 Comment(s): Additional testing (HER-2, MMR and MSI) are pending. The primary tumor site appears to be the left ovary and fallopian tube. The uterus is only involved on the serosal surface. There is tumor on the anterior peritoneal reflection.  Addendum: Tumor is Her2 negative,MSI stable   02/19/2019 Tumor Marker   Patient's tumor was tested for the following markers: CA-125 Results of the tumor marker test revealed 40.3   04/01/2019 Tumor Marker   Patient's tumor was tested for the following markers: CA-125 Results of the tumor marker test revealed 8.1    Genetic Testing   Negative testing. No  pathogenic variants identified on the [employer]. The report date is 04/17/2019.  Somatic genes analyzed through [facility]: ATM, BARD1, BRCA1, BRCA2, BRIP1, CHEK2, MRE11A, NBN, PALB2, RAD51C, RAD51D.  The [facility]Next gene panel offered by [employer] includes sequencing and rearrangement analysis for the following 36 genes: APC*, ATM*, AXIN2, BARD1, BMPR1A, BRCA1*, BRCA2*, BRIP1*, CDH1*, CDK4, CDKN2A, CHEK2*, DICER1, MLH1*, MSH2*, MSH3, MSH6*, MUTYH*, NBN, NF1*, NTHL1, PALB2*, PMS2*, PTEN*, RAD51C*, RAD51D*, RECQL, SMAD4, SMARCA4, STK11 and TP53* (sequencing and deletion/duplication); HOXB13, POLD1 and POLE (sequencing only); EPCAM and GREM1 (deletion/duplication only).    05/21/2019 Imaging   1. Interval hysterectomy, oophorectomy and omentectomy. No evidence of residual ovarian carcinoma in the pelvis. No evidence of peritoneal disease. No intraperitoneal free fluid. 2. Nonobstructing LEFT renal calculi.  Normal ureters.   05/22/2019 Tumor Marker   Patient's tumor was tested for the following markers: CA-125 Results of the tumor marker test revealed 5.7.   06/03/2019 Procedure   Successful right IJ vein Port-A-Cath explant.   08/26/2019 Tumor Marker   Patient's tumor was tested for the following markers: CA-125. Results of the tumor marker test revealed 4.9   10/03/2019 Imaging   1. New tumor deposits along the capsular surfaces of the liver, spleen, in the left pelvis, and right paracolic gutter, compatible with metastatic disease. 2. Mild dilation of the dorsal pancreatic duct in the pancreatic body and head, cause uncertain. 3. Nonobstructive left nephrolithiasis. 4. Aortic atherosclerosis.   Aortic Atherosclerosis (ICD10-I70.0   10/10/2019 Procedure   Successful placement of a right IJ approach Power Port with ultrasound and fluoroscopic guidance. The catheter is ready  for use.     10/11/2019 Tumor Marker   Patient's tumor was tested for the following  markers: CA-125 Results of the tumor marker test revealed 19.6.   10/14/2019 -  Chemotherapy   The patient had carboplatin and gemzar for chemotherapy treatment.     11/04/2019 Tumor Marker   Patient's tumor was tested for the following markers: CA-125 Results of the tumor marker test revealed 11.4   11/28/2019 Tumor Marker   Patient's tumor was tested for the following markers: CA-125 Results of the tumor marker test revealed 5.8   12/20/2019 Imaging   1. Significant interval reduction in mixed solid and cystic nodule in the vicinity of the left ovary, with significant improvement or resolution of multiple pelvic, peritoneal, and organ capsule implants. Resolution of previously seen small volume ascites. Findings are consistent with treatment response of abdominal metastatic disease. 2. Status post hysterectomy, oophorectomy, and omentectomy. 3. Nonobstructive left nephrolithiasis. 4. Aortic Atherosclerosis (ICD10-I70.0).   12/23/2019 Tumor Marker   Patient's tumor was tested for the following markers: CA-125 Results of the tumor marker test revealed 5.0   01/24/2020 Tumor Marker   Patient's tumor was tested for the following markers: CA-125 Results of the tumor marker test revealed 4.8   02/24/2020 Tumor Marker   Patient's tumor was tested for the following markers: CA-125. Results of the tumor marker test revealed 4.3.   03/06/2020 Imaging   1. Status post hysterectomy, bilateral oophorectomy, and omentectomy. 2. Resolution of previously described left adnexal nodularity. 3. No residual disease identified. 4.  Aortic Atherosclerosis (ICD10-I70.0). 5. Left nephrolithiasis.   04/20/2020 Tumor Marker   Patient's tumor was tested for the following markers: CA-125 Results of the tumor marker test revealed 4.6   05/18/2020 Tumor Marker   Patient's tumor was tested for the following markers: CA-125 Results of the tumor marker test revealed 4.7   05/29/2020 Imaging   Status post  hysterectomy and bilateral salpingo oophorectomy.   No evidence of recurrent or metastatic disease.   06/08/2020 - 06/08/2020 Chemotherapy   She is taking niraparib        06/17/2020 Tumor Marker   Patient's tumor was tested for the following markers: CA-125 Results of the tumor marker test revealed 4.5   08/04/2020 Tumor Marker   Patient's tumor was tested for the following markers: CA-125. Results of the tumor marker test revealed 5.7   09/07/2020 Imaging   1. Status post hysterectomy and bilateral salpingo oophorectomy. No evidence of recurrent or metastatic disease. 2. Nonobstructing left nephrolithiasis. 3. Left-sided colonic diverticulosis without findings of acute diverticulitis. 4. Aortic atherosclerosis.     09/07/2020 Tumor Marker   Patient's tumor was tested for the following markers: CA-125 Results of the tumor marker test revealed 4.8.   10/21/2020 Tumor Marker   Patient's tumor was tested for the following markers: CA-125. Results of the tumor marker test revealed 5.1.   12/14/2020 Tumor Marker   Patient's tumor was tested for the following markers: CA-125. Results of the tumor marker test revealed 5.5.     PHYSICAL EXAMINATION: ECOG PERFORMANCE STATUS: 1 - Symptomatic but completely ambulatory  Vitals:   04/05/21 0847  BP: (!) 178/80  Pulse: 84  Resp: 18  Temp: 97.6 F (36.4 C)  SpO2: 100%   Filed Weights   04/05/21 0847  Weight: 154 lb 3.2 oz (69.9 kg)    GENERAL:alert, no distress and comfortable SKIN: skin color, texture, turgor are normal, no rashes or significant lesions EYES: normal, Conjunctiva are pink and  non-injected, sclera clear OROPHARYNX:no exudate, no erythema and lips, buccal mucosa, and tongue normal  NECK: supple, thyroid normal size, non-tender, without nodularity LYMPH:  no palpable lymphadenopathy in the cervical, axillary or inguinal LUNGS: clear to auscultation and percussion with normal breathing effort HEART: regular rate  & rhythm and no murmurs and no lower extremity edema ABDOMEN:abdomen soft, non-tender and normal bowel sounds Musculoskeletal:no cyanosis of digits and no clubbing  NEURO: alert & oriented x 3 with fluent speech, no focal motor/sensory deficits  LABORATORY DATA:  I have reviewed the data as listed    Component Value Date/Time   NA 140 04/05/2021 0809   K 3.8 04/05/2021 0809   CL 107 04/05/2021 0809   CO2 23 04/05/2021 0809   GLUCOSE 114 (H) 04/05/2021 0809   BUN 11 04/05/2021 0809   CREATININE 0.65 04/05/2021 0809   CREATININE 0.75 06/24/2020 1413   CALCIUM 8.8 (L) 04/05/2021 0809   PROT 6.7 04/05/2021 0809   ALBUMIN 4.1 04/05/2021 0809   AST 35 04/05/2021 0809   AST 31 06/24/2020 1413   ALT 26 04/05/2021 0809   ALT 23 06/24/2020 1413   ALKPHOS 88 04/05/2021 0809   BILITOT 2.2 (H) 04/05/2021 0809   BILITOT 1.9 (H) 06/24/2020 1413   GFRNONAA >60 04/05/2021 0809   GFRNONAA >60 06/24/2020 1413   GFRAA >60 02/24/2020 0852    No results found for: SPEP, UPEP  Lab Results  Component Value Date   WBC 5.8 04/05/2021   NEUTROABS 4.3 04/05/2021   HGB 14.0 04/05/2021   HCT 40.4 04/05/2021   MCV 95.5 04/05/2021   PLT 217 04/05/2021      Chemistry      Component Value Date/Time   NA 140 04/05/2021 0809   K 3.8 04/05/2021 0809   CL 107 04/05/2021 0809   CO2 23 04/05/2021 0809   BUN 11 04/05/2021 0809   CREATININE 0.65 04/05/2021 0809   CREATININE 0.75 06/24/2020 1413      Component Value Date/Time   CALCIUM 8.8 (L) 04/05/2021 0809   ALKPHOS 88 04/05/2021 0809   AST 35 04/05/2021 0809   AST 31 06/24/2020 1413   ALT 26 04/05/2021 0809   ALT 23 06/24/2020 1413   BILITOT 2.2 (H) 04/05/2021 0809   BILITOT 1.9 (H) 06/24/2020 1413

## 2021-04-06 ENCOUNTER — Telehealth: Payer: Self-pay | Admitting: *Deleted

## 2021-04-06 LAB — CA 125: Cancer Antigen (CA) 125: 5.9 U/mL (ref 0.0–38.1)

## 2021-04-06 NOTE — Telephone Encounter (Signed)
Contacted patient regarding test results as directed by Dr. Alvy Bimler in message below. LVM for patient to contact office  ----- Message ----- From: [name] Lark, MD Sent: 04/06/2021   8:47 AM EST To: Rolland Bimler, RN  Pls let her know CA-125 is normal

## 2021-04-07 ENCOUNTER — Other Ambulatory Visit (HOSPITAL_COMMUNITY): Payer: Self-pay

## 2021-04-09 ENCOUNTER — Other Ambulatory Visit (HOSPITAL_COMMUNITY): Payer: Self-pay

## 2021-04-09 MED FILL — Niraparib Tosylate Cap 100 MG (Base Equivalent): ORAL | 30 days supply | Qty: 30 | Fill #7 | Status: AC

## 2021-04-13 ENCOUNTER — Other Ambulatory Visit (HOSPITAL_COMMUNITY): Payer: Self-pay

## 2021-05-04 ENCOUNTER — Other Ambulatory Visit (HOSPITAL_COMMUNITY): Payer: Self-pay

## 2021-05-05 ENCOUNTER — Other Ambulatory Visit (HOSPITAL_COMMUNITY): Payer: Self-pay

## 2021-05-05 MED FILL — Niraparib Tosylate Cap 100 MG (Base Equivalent): ORAL | 30 days supply | Qty: 30 | Fill #8 | Status: AC

## 2021-05-11 ENCOUNTER — Other Ambulatory Visit (HOSPITAL_COMMUNITY): Payer: Self-pay

## 2021-05-24 ENCOUNTER — Encounter: Payer: Self-pay | Admitting: Hematology and Oncology

## 2021-05-31 ENCOUNTER — Inpatient Hospital Stay (HOSPITAL_BASED_OUTPATIENT_CLINIC_OR_DEPARTMENT_OTHER): Payer: BC Managed Care – PPO | Admitting: Hematology and Oncology

## 2021-05-31 ENCOUNTER — Encounter: Payer: Self-pay | Admitting: Hematology and Oncology

## 2021-05-31 ENCOUNTER — Other Ambulatory Visit (HOSPITAL_COMMUNITY): Payer: Self-pay

## 2021-05-31 ENCOUNTER — Other Ambulatory Visit: Payer: Self-pay

## 2021-05-31 ENCOUNTER — Inpatient Hospital Stay: Payer: BC Managed Care – PPO | Attending: Gynecologic Oncology

## 2021-05-31 DIAGNOSIS — R748 Abnormal levels of other serum enzymes: Secondary | ICD-10-CM | POA: Diagnosis not present

## 2021-05-31 DIAGNOSIS — C562 Malignant neoplasm of left ovary: Secondary | ICD-10-CM

## 2021-05-31 DIAGNOSIS — Z7189 Other specified counseling: Secondary | ICD-10-CM

## 2021-05-31 DIAGNOSIS — Z9221 Personal history of antineoplastic chemotherapy: Secondary | ICD-10-CM | POA: Diagnosis not present

## 2021-05-31 DIAGNOSIS — C786 Secondary malignant neoplasm of retroperitoneum and peritoneum: Secondary | ICD-10-CM

## 2021-05-31 LAB — CBC WITH DIFFERENTIAL/PLATELET
Abs Immature Granulocytes: 0.01 10*3/uL (ref 0.00–0.07)
Basophils Absolute: 0 10*3/uL (ref 0.0–0.1)
Basophils Relative: 1 %
Eosinophils Absolute: 0.1 10*3/uL (ref 0.0–0.5)
Eosinophils Relative: 2 %
HCT: 38.8 % (ref 36.0–46.0)
Hemoglobin: 13.4 g/dL (ref 12.0–15.0)
Immature Granulocytes: 0 %
Lymphocytes Relative: 23 %
Lymphs Abs: 1.3 10*3/uL (ref 0.7–4.0)
MCH: 32.9 pg (ref 26.0–34.0)
MCHC: 34.5 g/dL (ref 30.0–36.0)
MCV: 95.3 fL (ref 80.0–100.0)
Monocytes Absolute: 0.4 10*3/uL (ref 0.1–1.0)
Monocytes Relative: 8 %
Neutro Abs: 3.9 10*3/uL (ref 1.7–7.7)
Neutrophils Relative %: 66 %
Platelets: 215 10*3/uL (ref 150–400)
RBC: 4.07 MIL/uL (ref 3.87–5.11)
RDW: 12.7 % (ref 11.5–15.5)
WBC: 5.8 10*3/uL (ref 4.0–10.5)
nRBC: 0 % (ref 0.0–0.2)

## 2021-05-31 LAB — COMPREHENSIVE METABOLIC PANEL
ALT: 31 U/L (ref 0–44)
AST: 38 U/L (ref 15–41)
Albumin: 4.2 g/dL (ref 3.5–5.0)
Alkaline Phosphatase: 90 U/L (ref 38–126)
Anion gap: 9 (ref 5–15)
BUN: 14 mg/dL (ref 8–23)
CO2: 25 mmol/L (ref 22–32)
Calcium: 8.9 mg/dL (ref 8.9–10.3)
Chloride: 105 mmol/L (ref 98–111)
Creatinine, Ser: 0.58 mg/dL (ref 0.44–1.00)
GFR, Estimated: >60 mL/min (ref >60)
Glucose, Bld: 98 mg/dL (ref 70–99)
Potassium: 3.7 mmol/L (ref 3.5–5.1)
Sodium: 139 mmol/L (ref 135–145)
Total Bilirubin: 2.1 mg/dL — ABNORMAL HIGH (ref 0.3–1.2)
Total Protein: 6.5 g/dL (ref 6.5–8.1)

## 2021-05-31 MED ORDER — SODIUM CHLORIDE 0.9% FLUSH
10.0000 mL | Freq: Once | INTRAVENOUS | Status: AC
  Administered 2021-05-31: 10 mL

## 2021-05-31 MED ORDER — HEPARIN SOD (PORK) LOCK FLUSH 100 UNIT/ML IV SOLN
500.0000 [IU] | Freq: Once | INTRAVENOUS | Status: AC
  Administered 2021-05-31: 500 [IU]

## 2021-05-31 NOTE — Assessment & Plan Note (Signed)
She tolerated chemotherapy fairly well Her blood counts are within normal limits Her chronic elevated bilirubin is unrelated Her last CT imaging and tumor marker are completely normal The plan will be to continue this indefinitely In the absence of disease, I do not recommend repeating imaging study unless her tumor marker is elevated and she has signs and symptoms to suggest cancer recurrence I will see her every other month She will continue niraparib as prescribed

## 2021-05-31 NOTE — Assessment & Plan Note (Signed)
She has chronic elevated bilirubin Observe closely for now

## 2021-05-31 NOTE — Progress Notes (Signed)
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[facility]) She tolerated chemotherapy fairly well Her blood counts are within normal limits Her chronic elevated bilirubin is unrelated Her last CT imaging and tumor marker are completely normal The plan will be to continue this indefinitely In the absence of disease, I do not recommend repeating imaging study unless her tumor marker is elevated and she has signs and symptoms to suggest cancer recurrence I will see her every other month She will continue niraparib as prescribed  Elevated liver enzymes She has chronic elevated bilirubin Observe closely for now  No orders of the defined types were placed in this encounter.   All questions were answered. The patient knows to call the clinic with any problems, questions or concerns. The total time spent in the appointment was 20 minutes encounter with patients including review of chart and various tests results, discussions about plan of care and coordination of care plan   [name] Lark, MD 05/31/2021 9:20 AM  INTERVAL HISTORY: Please see below for problem oriented charting. she returns for treatment follow-up on niraparib for ovarian cancer She is doing well No side effects from treatment so far No recent infection Denies abdominal pain, changes in bowel habits or nausea  REVIEW OF SYSTEMS:   Constitutional: Denies fevers, chills or abnormal weight loss Eyes: Denies blurriness of vision Ears, nose, mouth, throat, and face: Denies mucositis or sore throat Respiratory: Denies cough, dyspnea or wheezes Cardiovascular: Denies palpitation, chest discomfort or lower extremity swelling Gastrointestinal:  Denies nausea, heartburn or change in bowel habits Skin: Denies  abnormal skin rashes Lymphatics: Denies new lymphadenopathy or easy bruising Neurological:Denies numbness, tingling or new weaknesses Behavioral/Psych: Mood is stable, no new changes  All other systems were reviewed with the patient and are negative.  I have reviewed the past medical history, past surgical history, social history and family history with the patient and they are unchanged from previous note.  ALLERGIES:  is allergic to skin adhesives [cyanoacrylate], shrimp [shellfish allergy], and sulfonamide derivatives.  MEDICATIONS:  Current Outpatient Medications  Medication Sig Dispense Refill   ALPRAZolam (XANAX) 0.25 MG tablet Take 1 tablet (0.25 mg total) by mouth at bedtime as needed for anxiety. 30 tablet 0   carboxymethylcellulose (REFRESH PLUS) 0.5 % SOLN Place 1 drop into both eyes 2 (two) times daily as needed (dry eyes).     Cholecalciferol (VITAMIN D3) 25 MCG (1000 UT) CAPS Take 1,000 Units by mouth daily.      esomeprazole (NEXIUM) 20 MG capsule Take 20 mg by mouth daily.      estradiol (ESTRACE VAGINAL) 0.1 MG/GM vaginal cream Place 1 Applicatorful vaginally 3 (three) times a week. 42.5 g 12   loratadine (CLARITIN) 10 MG tablet Take 10 mg by mouth daily as needed for allergies.     Multiple Vitamin (MULTIVITAMIN WITH MINERALS) TABS tablet Take 1 tablet by mouth daily.     niraparib tosylate (ZEJULA) 100 MG capsule TAKE 1 CAPSULE (100 MG TOTAL) BY MOUTH DAILY. MAY TAKE AT BEDTIME TO REDUCE NAUSEA AND VOMITING. 30 capsule 11   prochlorperazine (COMPAZINE) 10 MG tablet TAKE 1 TABLET BY MOUTH EVERY 6 HOURS AS NEEDED FOR NAUSEA OR VOMITING 30 tablet 2   No current facility-administered medications for this visit.    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 14.7 cm poorly defined soft tissue mass in central pelvis suspicious for primary  ovarian carcinoma, with uterine leiomyosarcoma considered a less likely differential diagnosis. 2. Moderate ascites and diffuse peritoneal carcinomatosis. 3. Several small uterine fibroids.   11/13/2018 Tumor Marker   Patient's tumor was tested for the following markers: CA-125 Results of the tumor marker test revealed 1434   11/21/2018 Procedure   Successful ultrasound-guided diagnostic and therapeutic paracentesis yielding 3.1 liters of peritoneal fluid   11/21/2018 Pathology Results   PERITONEAL/ASCITIC FLUID(SPECIMEN 1 OF 1 COLLECTED 11/21/18): ADENOCARCINOMA. Specimen Clinical Information Pelvic mass suspicious for ovarian cancer Source Peritoneal/Ascitic Fluid, (specimen 1 of 1 collected 11/21/18) Gross Specimen: Received is/are 1000 ccs of dark amber fluid. (CM:cm) Prepared: # Smears: 0 # Concentration Technique Slides (i.e. 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   11/29/2018 Initial Diagnosis   Ovarian cancer ([place])   11/29/2018 Cancer Staging   Staging form: Ovary, Fallopian Tube, and Primary Peritoneal Carcinoma, AJCC 8th Edition - Clinical: cT3, cN0, cM0 - Signed by [name] Lark, MD on 11/29/2018    12/03/2018 Procedure   Successful ultrasound-guided therapeutic paracentesis yielding 3.7 liters of peritoneal fluid.     12/06/2018 Procedure   Placement of a subcutaneous port device. Catheter tip at the SVC and right atrium junction   12/14/2018 Procedure   Successful ultrasound-guided paracentesis yielding 2.9 L of peritoneal fluid   12/28/2018 Tumor Marker   Patient's tumor was tested for the following markers: CA-125 Results of the tumor marker test revealed 812.   12/28/2018 - 04/22/2019 Chemotherapy   The patient had carboplatin and taxol for neoadjuvant treatment, followed by interval debulking surgery and subsequent adjuvant chemotherapy treatment.     02/01/2019 Imaging   Ct  abdomen and pelvis 8.6 cm left ovarian mass, corresponding to the patient's known primary neoplasm, improved.   Mild peritoneal nodularity/omental caking, improved.   Small abdominopelvic ascites, improved.     02/01/2019 Tumor Marker   Patient's tumor was tested for the following markers: CA-125 Results of the tumor marker test revealed 183.   02/12/2019 Surgery   Preoperative Diagnosis: Stage IIIC ovarian cancer, s/p neoadjuvant chemotherapy      Procedure(s) Performed: 1. Exploratory laparotomy with total abdominal hysterectomy, bilateral salpingo-oophorectomy, omentectomy radical tumor debulking for ovarian cancer.   Surgeon: Thereasa Solo, MD.    Operative Findings: upper abdomen free of disease. No visible omental disease. Small volume (200cc) ascites. 8cm friable mass replacing left ovary and adherent to the sigmoid colon mesentery and ureter on the left.  Anterior fibroid. This represented an optimal cytoreduction (R0) with no gross visible disease remaining.    02/12/2019 Pathology Results   A. OVARY AND FALLOPIAN TUBE, LEFT, SALPINGO-OOPHORECTOMY: - Serous carcinoma, high grade, status post neoadjuvant therapy - See oncology table and comment below B. UTERUS CERVIX WITH RIGHT FALLOPIAN TUBE AND OVARY, HYSTERECTOMY: Uterus: - Serosal surface involved by serous carcinoma - Endomyometrium uninvolved by carcinoma - Benign endometrial polyp (4.1 cm) - Leiomyomata (5.5 cm; largest) - Adenomyosis Cervix: - Uninvolved by carcinoma Left ovary: - Serous carcinoma, high grade Left fallopian tube: - Serous carcinoma, high grade C. SOFT TISSUE, LEFT PELVIC SIDEWALL TUMOR, EXCISION: - Metastatic serous carcinoma, high-grade D. SOFT TISSUE, SIGMOID COLON MESENTERY, EXCISION: - Metastatic serous  carcinoma, high-grade E. OMENTUM, TUMOR RESECTION: - Metastatic serous carcinoma, high-grade OVARY or FALLOPIAN TUBE or PRIMARY PERITONEUM: Procedure: Total hysterectomy and bilateral  salpingo-oophorectomy, omentectomy and peritoneal biopsies Specimen Integrity: Fragmented Tumor Site: Left ovary and fallopian tube Ovarian Surface Involvement: Present Fallopian Tube Surface Involvement: Present Tumor Size: 6.3 cm (see comment) Histologic Type: Serous carcinoma Histologic Grade: High-grade Implants: Not applicable Other Tissue/ Organ Involvement: Right ovary, right fallopian tube, omentum, mesentery Largest Extrapelvic Peritoneal Focus: Macroscopic Peritoneal/Ascitic Fluid: Malignant Treatment Effect: No definite or minimal response identified (chemotherapy response score 1 [CRS1] Regional Lymph Nodes: No lymph nodes submitted or found Pathologic Stage Classification (pTNM, AJCC 8th Edition): pT3c, pNX Representative Tumor Block: A4 Comment(s): Additional testing (HER-2, MMR and MSI) are pending. The primary tumor site appears to be the left ovary and fallopian tube. The uterus is only involved on the serosal surface. There is tumor on the anterior peritoneal reflection.  Addendum: Tumor is Her2 negative,MSI stable   02/19/2019 Tumor Marker   Patient's tumor was tested for the following markers: CA-125 Results of the tumor marker test revealed 40.3   04/01/2019 Tumor Marker   Patient's tumor was tested for the following markers: CA-125 Results of the tumor marker test revealed 8.1    Genetic Testing   Negative testing. No pathogenic variants identified on the [employer]. The report date is 04/17/2019.  Somatic genes analyzed through [facility]: ATM, BARD1, BRCA1, BRCA2, BRIP1, CHEK2, MRE11A, NBN, PALB2, RAD51C, RAD51D.  The [facility]Next gene panel offered by [employer] includes sequencing and rearrangement analysis for the following 36 genes: APC*, ATM*, AXIN2, BARD1, BMPR1A, BRCA1*, BRCA2*, BRIP1*, CDH1*, CDK4, CDKN2A, CHEK2*, DICER1, MLH1*, MSH2*, MSH3, MSH6*, MUTYH*, NBN, NF1*, NTHL1, PALB2*, PMS2*, PTEN*, RAD51C*, RAD51D*, RECQL,  SMAD4, SMARCA4, STK11 and TP53* (sequencing and deletion/duplication); HOXB13, POLD1 and POLE (sequencing only); EPCAM and GREM1 (deletion/duplication only).    05/21/2019 Imaging   1. Interval hysterectomy, oophorectomy and omentectomy. No evidence of residual ovarian carcinoma in the pelvis. No evidence of peritoneal disease. No intraperitoneal free fluid. 2. Nonobstructing LEFT renal calculi.  Normal ureters.   05/22/2019 Tumor Marker   Patient's tumor was tested for the following markers: CA-125 Results of the tumor marker test revealed 5.7.   06/03/2019 Procedure   Successful right IJ vein Port-A-Cath explant.   08/26/2019 Tumor Marker   Patient's tumor was tested for the following markers: CA-125. Results of the tumor marker test revealed 4.9   10/03/2019 Imaging   1. New tumor deposits along the capsular surfaces of the liver, spleen, in the left pelvis, and right paracolic gutter, compatible with metastatic disease. 2. Mild dilation of the dorsal pancreatic duct in the pancreatic body and head, cause uncertain. 3. Nonobstructive left nephrolithiasis. 4. Aortic atherosclerosis.   Aortic Atherosclerosis (ICD10-I70.0   10/10/2019 Procedure   Successful placement of a right IJ approach Power Port with ultrasound and fluoroscopic guidance. The catheter is ready for use.     10/11/2019 Tumor Marker   Patient's tumor was tested for the following markers: CA-125 Results of the tumor marker test revealed 19.6.   10/14/2019 -  Chemotherapy   The patient had carboplatin and gemzar for chemotherapy treatment.     11/04/2019 Tumor Marker   Patient's tumor was tested for the following markers: CA-125 Results of the tumor marker test revealed 11.4   11/28/2019 Tumor Marker   Patient's tumor was tested for the following markers: CA-125 Results of the tumor marker test revealed 5.8   12/20/2019 Imaging  1. Significant interval reduction in mixed solid and cystic nodule in the vicinity of  the left ovary, with significant improvement or resolution of multiple pelvic, peritoneal, and organ capsule implants. Resolution of previously seen small volume ascites. Findings are consistent with treatment response of abdominal metastatic disease. 2. Status post hysterectomy, oophorectomy, and omentectomy. 3. Nonobstructive left nephrolithiasis. 4. Aortic Atherosclerosis (ICD10-I70.0).   12/23/2019 Tumor Marker   Patient's tumor was tested for the following markers: CA-125 Results of the tumor marker test revealed 5.0   01/24/2020 Tumor Marker   Patient's tumor was tested for the following markers: CA-125 Results of the tumor marker test revealed 4.8   02/24/2020 Tumor Marker   Patient's tumor was tested for the following markers: CA-125. Results of the tumor marker test revealed 4.3.   03/06/2020 Imaging   1. Status post hysterectomy, bilateral oophorectomy, and omentectomy. 2. Resolution of previously described left adnexal nodularity. 3. No residual disease identified. 4.  Aortic Atherosclerosis (ICD10-I70.0). 5. Left nephrolithiasis.   04/20/2020 Tumor Marker   Patient's tumor was tested for the following markers: CA-125 Results of the tumor marker test revealed 4.6   05/18/2020 Tumor Marker   Patient's tumor was tested for the following markers: CA-125 Results of the tumor marker test revealed 4.7   05/29/2020 Imaging   Status post hysterectomy and bilateral salpingo oophorectomy.   No evidence of recurrent or metastatic disease.   06/08/2020 - 06/08/2020 Chemotherapy   She is taking niraparib        06/17/2020 Tumor Marker   Patient's tumor was tested for the following markers: CA-125 Results of the tumor marker test revealed 4.5   08/04/2020 Tumor Marker   Patient's tumor was tested for the following markers: CA-125. Results of the tumor marker test revealed 5.7   09/07/2020 Imaging   1. Status post hysterectomy and bilateral salpingo oophorectomy. No evidence of  recurrent or metastatic disease. 2. Nonobstructing left nephrolithiasis. 3. Left-sided colonic diverticulosis without findings of acute diverticulitis. 4. Aortic atherosclerosis.     09/07/2020 Tumor Marker   Patient's tumor was tested for the following markers: CA-125 Results of the tumor marker test revealed 4.8.   10/21/2020 Tumor Marker   Patient's tumor was tested for the following markers: CA-125. Results of the tumor marker test revealed 5.1.   12/14/2020 Tumor Marker   Patient's tumor was tested for the following markers: CA-125. Results of the tumor marker test revealed 5.5.   04/05/2021 Tumor Marker   Patient's tumor was tested for the following markers: CA-125. Results of the tumor marker test revealed 5.9.     PHYSICAL EXAMINATION: ECOG PERFORMANCE STATUS: 0 - Asymptomatic  Vitals:   05/31/21 0812  BP: (!) 169/98  Pulse: 84  Resp: 18  Temp: (!) 97.5 F (36.4 C)  SpO2: 100%   Filed Weights   05/31/21 0812  Weight: 155 lb 6.4 oz (70.5 kg)    GENERAL:alert, no distress and comfortable SKIN: skin color, texture, turgor are normal, no rashes or significant lesions EYES: normal, Conjunctiva are pink and non-injected, sclera clear OROPHARYNX:no exudate, no erythema and lips, buccal mucosa, and tongue normal  NECK: supple, thyroid normal size, non-tender, without nodularity LYMPH:  no palpable lymphadenopathy in the cervical, axillary or inguinal LUNGS: clear to auscultation and percussion with normal breathing effort HEART: regular rate & rhythm and no murmurs and no lower extremity edema ABDOMEN:abdomen soft, non-tender and normal bowel sounds Musculoskeletal:no cyanosis of digits and no clubbing  NEURO: alert & oriented x 3 with  fluent speech, no focal motor/sensory deficits  LABORATORY DATA:  I have reviewed the data as listed    Component Value Date/Time   NA 139 05/31/2021 0741   K 3.7 05/31/2021 0741   CL 105 05/31/2021 0741   CO2 25 05/31/2021 0741    GLUCOSE 98 05/31/2021 0741   BUN 14 05/31/2021 0741   CREATININE 0.58 05/31/2021 0741   CREATININE 0.75 06/24/2020 1413   CALCIUM 8.9 05/31/2021 0741   PROT 6.5 05/31/2021 0741   ALBUMIN 4.2 05/31/2021 0741   AST 38 05/31/2021 0741   AST 31 06/24/2020 1413   ALT 31 05/31/2021 0741   ALT 23 06/24/2020 1413   ALKPHOS 90 05/31/2021 0741   BILITOT 2.1 (H) 05/31/2021 0741   BILITOT 1.9 (H) 06/24/2020 1413   GFRNONAA >60 05/31/2021 0741   GFRNONAA >60 06/24/2020 1413   GFRAA >60 02/24/2020 0852    No results found for: SPEP, UPEP  Lab Results  Component Value Date   WBC 5.8 05/31/2021   NEUTROABS 3.9 05/31/2021   HGB 13.4 05/31/2021   HCT 38.8 05/31/2021   MCV 95.3 05/31/2021   PLT 215 05/31/2021      Chemistry      Component Value Date/Time   NA 139 05/31/2021 0741   K 3.7 05/31/2021 0741   CL 105 05/31/2021 0741   CO2 25 05/31/2021 0741   BUN 14 05/31/2021 0741   CREATININE 0.58 05/31/2021 0741   CREATININE 0.75 06/24/2020 1413      Component Value Date/Time   CALCIUM 8.9 05/31/2021 0741   ALKPHOS 90 05/31/2021 0741   AST 38 05/31/2021 0741   AST 31 06/24/2020 1413   ALT 31 05/31/2021 0741   ALT 23 06/24/2020 1413   BILITOT 2.1 (H) 05/31/2021 0741   BILITOT 1.9 (H) 06/24/2020 1413

## 2021-06-01 ENCOUNTER — Telehealth: Payer: Self-pay

## 2021-06-01 LAB — CA 125: Cancer Antigen (CA) 125: 8.1 U/mL (ref 0.0–38.1)

## 2021-06-01 NOTE — Telephone Encounter (Signed)
-----   Message from [name] Lark, MD sent at 06/01/2021  7:06 AM EST ----- Pls call her, CA-125 is normal

## 2021-06-01 NOTE — Telephone Encounter (Signed)
Called and given below message. She verbalized understanding. 

## 2021-06-02 DIAGNOSIS — Z961 Presence of intraocular lens: Secondary | ICD-10-CM | POA: Diagnosis not present

## 2021-06-02 DIAGNOSIS — H26493 Other secondary cataract, bilateral: Secondary | ICD-10-CM | POA: Diagnosis not present

## 2021-06-03 ENCOUNTER — Other Ambulatory Visit (HOSPITAL_COMMUNITY): Payer: Self-pay

## 2021-06-04 ENCOUNTER — Ambulatory Visit: Payer: BC Managed Care – PPO | Attending: Internal Medicine | Admitting: Physical Therapy

## 2021-06-04 ENCOUNTER — Other Ambulatory Visit: Payer: Self-pay

## 2021-06-04 ENCOUNTER — Encounter: Payer: Self-pay | Admitting: Physical Therapy

## 2021-06-04 DIAGNOSIS — R252 Cramp and spasm: Secondary | ICD-10-CM

## 2021-06-04 DIAGNOSIS — M6289 Other specified disorders of muscle: Secondary | ICD-10-CM | POA: Diagnosis not present

## 2021-06-04 DIAGNOSIS — C562 Malignant neoplasm of left ovary: Secondary | ICD-10-CM | POA: Diagnosis not present

## 2021-06-04 DIAGNOSIS — M6281 Muscle weakness (generalized): Secondary | ICD-10-CM | POA: Diagnosis not present

## 2021-06-04 NOTE — Therapy (Signed)
[facility] @ [place], [place], [address] Phone: [phone]   Fax:  [phone]  Physical Therapy Evaluation  Patient Details  Name: Stacy Rivas MRN: [id number] Date of Birth: Jan 16, 1956 Referring Provider (PT): Stacy Lark, MD   Encounter Date: 06/04/2021   PT End of Session - 06/07/21 0706     Visit Number 1    Date for PT Re-Evaluation 08/27/21    Authorization Type BCBS    PT Start Time [phone]    PT Stop Time 0930    PT Time Calculation (min) 43 min    Activity Tolerance Patient tolerated treatment well    Behavior During Therapy [facility] for tasks assessed/performed             Past Medical History:  Diagnosis Date   Anxiety state, unspecified    Diverticulosis    Esophageal reflux    Family history of breast cancer    Family history of lymphoma    Family history of stomach cancer    Family history of uterine cancer    Fibroid tumor    Headache    due to allergies    Hiatal hernia    Hyperlipidemia    IBS (irritable bowel syndrome)    Internal hemorrhoids    Nonspecific elevation of levels of transaminase or lactic acid dehydrogenase (LDH)    ovarian ca dx'd 11/2018   Renal stone 07/2013   Tubulovillous adenoma of colon    Varicose veins    superficail thrombophlebitis (left LE)    Past Surgical History:  Procedure Laterality Date   CATARACT EXTRACTION, BILATERAL     cystoscopy with instillation for cystitis  2001   DEBULKING N/A 02/12/2019   Procedure: DEBULKING OF TUMOR;  Surgeon: Everitt Amber, MD;  Location: [facility];  Service: Gynecology;  Laterality: N/A;   DILATION AND CURETTAGE OF UTERUS     miscarriage   HYSTERECTOMY ABDOMINAL WITH SALPINGO-OOPHORECTOMY Bilateral 02/12/2019   Procedure: HYSTERECTOMY ABDOMINAL WITH SALPINGO-OOPHORECTOMY;  Surgeon: Everitt Amber, MD;  Location: [facility];  Service: Gynecology;  Laterality: Bilateral;   IR IMAGING GUIDED PORT INSERTION  12/06/2018   IR  IMAGING GUIDED PORT INSERTION  10/10/2019   IR PARACENTESIS  12/03/2018   IR PARACENTESIS  12/14/2018   IR REMOVAL TUN ACCESS W/ PORT W/O FL MOD SED  06/03/2019   LAPAROSCOPY     adhesions   LITHOTRIPSY  07/2013   x 2   MYOMECTOMY  1992   OMENTECTOMY N/A 02/12/2019   Procedure: OMENTECTOMY;  Surgeon: Everitt Amber, MD;  Location: [facility];  Service: Gynecology;  Laterality: N/A;   PARACENTESIS  11/21/2018   abdominal , removed 3.1 Liters     There were no vitals filed for this visit.    Subjective Assessment - 06/04/21 0850     Subjective Pt had ovarian cancer and chemo and surgery. Stacy Rivas is to make sure the cancer doesn't come back. I notice fecal smearing, gas leakage, and that the hemorrhoids are more irritated.  Not having bladder leakage.  Hemorrhoids get irritated when walking more. Leakage normally happens during walking.  I feel weak    Limitations Walking    How long can you walk comfortably? 1-2 miles    Currently in Pain? No/denies                [facility] PT Assessment - 06/07/21 0001       Assessment   Medical Diagnosis C56.2 (ICD-10-CM) - Left  ovarian epithelial cancer (Stacy Rivas); M62.89 (ICD-10-CM) - Pelvic floor dysfunction in female    Referring Provider (PT) Stacy Lark, MD    Onset Date/Surgical Date --   01/2019- full hysterectomy   Prior Therapy No      Precautions   Precautions None      Restrictions   Weight Bearing Restrictions No      Balance Screen   Has the patient fallen in the past 6 months No      [place] residence    Living Arrangements Spouse/significant other      Prior Function   Level of Independence Independent    Vocation Full time employment    Vocation Requirements desk work      Cognition   Overall Cognitive Status Within Functional Limits for tasks assessed      Functional Tests   Functional tests Single leg stance      Single Leg Stance   Comments Lt trendelenburg      Posture/Postural  Control   Posture/Postural Control Postural limitations    Postural Limitations Left pelvic obliquity;Anterior pelvic tilt;Increased lumbar lordosis;Rounded Shoulders;Increased thoracic kyphosis      AROM   Overall AROM Comments lumbar flexion 80%      PROM   Overall PROM Comments hip IR/ER 75% bil      Strength   Overall Strength Comments abdominal wall weak contraction      Flexibility   Soft Tissue Assessment /Muscle Length yes    Hamstrings 60% bil      Palpation   Palpation comment abdominal wall low tone; tight throughout the lumbar and thoracic spine      Ambulation/Gait   Gait Pattern Within Functional Limits                         Objective measurements completed on examination: See above findings.     Pelvic Floor Special Questions - 06/07/21 0001     Prior Pregnancies No    Currently Sexually Active No   feel like it would   Urinary Leakage No    Urinary urgency No    Urinary frequency normal    Fecal incontinence Yes    Falling out feeling (prolapse) --   maybe   Skin Integrity Intact    Perineal Body/Introitus  Elevated    Prolapse Anterior Wall   mild 1-2 cm down with valsalva   Pelvic Floor Internal Exam identity confirmed and informed consent given    Exam Type Vaginal    Sensation normal    Palpation tight levator bil    Strength fair squeeze, definite lift    Tone high                        PT Education - 06/07/21 0713     Education Details Access Code: [email or web address]    Person(s) Educated Patient    Methods Explanation;Demonstration;Handout;Verbal cues;Tactile cues    Comprehension Verbalized understanding;Returned demonstration                 PT Long Term Goals - 06/04/21 0929       PT LONG TERM GOAL #1   Title Pt will be ind with advanced HEP    Time 12    Period Weeks    Status New    Target Date 08/27/21      PT LONG TERM GOAL #2   Title Pt will  report there is no fecal smearing due to  improved BMs.    Time 12    Period Weeks    Status New    Target Date 08/27/21      PT LONG TERM GOAL #3   Title Pt will report she is having a complete BM without straining    Time 12    Period Weeks    Status New    Target Date 08/27/21      PT LONG TERM GOAL #4   Title Pt will be ind and understand how to use dilators to return to penetrative intercourse when she is ready    Time 12    Period Weeks    Status New    Target Date 08/27/21      PT LONG TERM GOAL #5   Title Pt will report 50% less instances of abdominal discomfort or hemorrhoid flare ups    Time 12    Period Weeks    Status New    Target Date 08/27/21                    Plan - 06/07/21 8366     Clinical Impression Statement Pt presents to clinic due to weakness and pain and issues around the anal area.  Pt has pelvic obliquity and abdominal weakness.  Pt has pelvic floor 3/5 strength and high tone that is unable to relax to perform quick flicks.  Pt will benefit from skilled PT to address all above mentioned impairments in order to restore function and improve toileting.    Personal Factors and Comorbidities Comorbidity 3+    Comorbidities ovarian cancer, hysterectomy, chemo    Examination-Activity Limitations Continence;Toileting    Examination-Participation Restrictions Community Activity;Interpersonal Relationship    Stability/Clinical Decision Making Evolving/Moderate complexity    Clinical Decision Making Moderate    Rehab Potential Excellent    PT Frequency 1x / week    PT Duration 12 weeks    PT Treatment/Interventions ADLs/Self Care Home Management;Biofeedback;Cryotherapy;Electrical Stimulation;Moist Heat;Therapeutic activities;Therapeutic exercise;Neuromuscular re-education;Patient/family education;Manual techniques;Passive range of motion;Dry needling;Taping    PT Next Visit Plan f/u on breathing and stretches, discuss biofeedback or internal STM to work on [place] Access Code: [email or web address]    Consulted and Agree with Plan of Care Patient             Patient will benefit from skilled therapeutic intervention in order to improve the following deficits and impairments:  Impaired flexibility, Increased muscle spasms, Decreased coordination, Decreased strength, Decreased endurance, Pain  Visit Diagnosis: Cramp and spasm  Muscle weakness (generalized)     Problem List Patient Active Problem List   Diagnosis Date Noted   Pelvic floor dysfunction in female 04/05/2021   Leg cramps 11/02/2020   Bilateral bunions 09/08/2020   Mucositis due to antineoplastic therapy 08/04/2020   Anorexia 08/04/2020   Chemotherapy-induced fatigue 05/04/2020   Foot pain, bilateral 05/04/2020   Chemotherapy-induced nausea 03/09/2020   Vaginal dryness, menopausal 02/10/2020   Thrombocytopenia ([place]) 10/22/2019   Skin rash 10/14/2019   Foreign body reaction of the skin 07/02/2019   Genetic testing 04/25/2019   Hyperglycemia, drug-induced 04/01/2019   Elevated liver enzymes 04/01/2019   Family history of breast cancer    Family history of uterine cancer    Family history of lymphoma    Family history of stomach cancer    Preventive measure 03/05/2019   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/17/2019  Anemia due to antineoplastic chemotherapy 01/17/2019   Peritoneal carcinomatosis ([place]) 11/30/2018   Goals of care, counseling/discussion 11/30/2018   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 @ [address] [place], [place], [address] Phone: [phone]   Fax:  [phone]  Name: Stacy Rivas MRN: [id number] Date of Birth: January 25, 1956

## 2021-06-07 ENCOUNTER — Telehealth: Payer: Self-pay

## 2021-06-07 ENCOUNTER — Encounter: Payer: Self-pay | Admitting: Hematology and Oncology

## 2021-06-07 ENCOUNTER — Other Ambulatory Visit (HOSPITAL_COMMUNITY): Payer: Self-pay

## 2021-06-07 MED FILL — Niraparib Tosylate Cap 100 MG (Base Equivalent): ORAL | 15 days supply | Qty: 15 | Fill #9 | Status: CN

## 2021-06-07 NOTE — Telephone Encounter (Signed)
Oral Oncology Patient Advocate Encounter   Received notification from [facility] that prior authorization for [place] is required.   PA submitted on CoverMyMeds Key [id number] Status is pending   Oral Oncology Clinic will continue to follow.  [place] Patient [place] Phone [phone] Fax [phone] 06/07/2021 12:09 PM

## 2021-06-07 NOTE — Patient Instructions (Signed)
Access Code: [email or web address] URL: [email or web address] Date: 06/07/2021 Prepared by: Jari Favre  Exercises Supine Pelvic Floor Stretch - 1 x daily - 7 x weekly - 1 sets - 3 reps - 30 sec hold Supine Diaphragmatic Breathing - 3 x daily - 7 x weekly - 1 sets - 10 reps

## 2021-06-07 NOTE — Telephone Encounter (Signed)
Oral Oncology Patient Advocate Encounter  Prior Authorization for Stacy Rivas has been approved.    PA# [id number] Effective dates: 06/07/21 through 06/06/22  Oral Oncology Clinic will continue to follow.   [place] [facility] [place] Phone [phone] Fax [phone] 06/07/2021 3:40 PM

## 2021-06-08 ENCOUNTER — Other Ambulatory Visit (HOSPITAL_COMMUNITY): Payer: Self-pay

## 2021-06-08 MED FILL — Niraparib Tosylate Cap 100 MG (Base Equivalent): ORAL | 15 days supply | Qty: 15 | Fill #9 | Status: AC

## 2021-06-10 ENCOUNTER — Other Ambulatory Visit (HOSPITAL_COMMUNITY): Payer: Self-pay

## 2021-06-17 ENCOUNTER — Other Ambulatory Visit: Payer: Self-pay

## 2021-06-17 ENCOUNTER — Ambulatory Visit: Payer: BC Managed Care – PPO | Admitting: Physical Therapy

## 2021-06-17 DIAGNOSIS — M6281 Muscle weakness (generalized): Secondary | ICD-10-CM | POA: Diagnosis not present

## 2021-06-17 DIAGNOSIS — M6289 Other specified disorders of muscle: Secondary | ICD-10-CM | POA: Diagnosis not present

## 2021-06-17 DIAGNOSIS — C562 Malignant neoplasm of left ovary: Secondary | ICD-10-CM | POA: Diagnosis not present

## 2021-06-17 DIAGNOSIS — R252 Cramp and spasm: Secondary | ICD-10-CM

## 2021-06-17 NOTE — Patient Instructions (Signed)
Vaginal trainers  Prior to Use:   Wash the vaginal trainer with soap and water before and after each use.   Use a water-soluble lubricant like Slippery Stuff or Surgulibe.   Avoid using Vaseline, coconut oil, or other oil-based lubricants. They are not water-soluble and can be irritating to the tissues in the vagina.   Do not use silicon-based lubricants with a silicon vaginal trainer. Using a siliconbased lubricant with a silicon device can contribute to break down of the material.  Setting Up Your Space   Work in a comfortable room lying on your back on a bed or couch with your knees bent and knees relaxed open. Use pillows underneath your knees as they are relaxed open and to support your upper back and head. Place a towel underneath your bottom to collect any lubricant.   Place your vaginal trainers and lubricant on a towel next to you within arm's reach for easy access.   Starting to use your trainer:  o Take 10-20 deep breaths to quiet your nervous system  o Perform stretches to help relax your hips and pelvic floor such as child's pose, cat/cow, or happy baby pose  Using Your Trainers   Coat the smallest vaginal trainer, or the size you are most comfortable using, with lubricant   Place the tip of the trainer at the opening to the vagina.   Take a few deep breaths to adjust to the sensation of the lubricant and trainer.   Slowly insert the rounded end of the trainer into your vaginal opening as far as you are comfortable. Pause and breathe if you experience pain, tension, or muscle guarding at any time. Once you feel comfortable gently slide trainer deeper into the vaginal canal as far as it will go without causing pain or discomfort.  Progressing with your Trainers   Gently press the trainer toward the bottom and sides of the vaginal opening to give it a gentle stretch. Pause and breathe at each spot and tension melts away.   Once fully inserted, turn the trainer clockwise and  counterclockwise to produce different sensations   Slowly move the trainer in and out as you breathe and focus on staying as relaxed as possible  To progress to the next size gradually, once one trainer is completely pain-free and comfortable to use, insert that smaller trainer first for 5-10 minutes and then follow with the next largest size trainer for 5-10 minutes. Gradually decrease the length of time using the smaller trainer as you increase the length of time using the larger trainer.   Move at your pace ad what's comfortable for you.  Wrapping up your session   Use trainers for 5-10 minutes every other day or 3 times a week   Wash and dry your vaginal trainers after use  Other considerations: Try to approach using vaginal trainers from a place of curiosity instead of judgment - what can my body do today, vs. why can't it do this, or I should be able to do this. Try letting go of that idea that it should be different, and try to meet yourself where you are at, without the pressure to change anything Do you bring your vaginal trainers into PT? Sometimes, bringing your trainers into sessions with your PT and having them talk you through the process while you are in control of the trainer can be helpful. Maybe they can help you find ways to make insertion a bit easier for you, or they can   help remind you to breathe. If you aren't doing this, I definitely recommend talking to a PT about it. Sometimes knowing the physical tools you have can help with the mental game. One thing that can be helpful to do before jumping to dilating is called "cupping". It is just taking your hand and holding your palm to your vulva and breathing. Doing this before doing any type of trainer training can be helpful as it lets you take a second and check in, vs jumping right in. Kind of like a warm up to your workout! Try different tools and see if you like another one more. Some of our patients prefer crystal wands or  plastic trainers to silicone, some prefer starting with a vibrating pelvic wand instead of a trainer, look at different options and see what interests you. You can also try different lubricants. And don't feel as though you need to jump right into inserting anything. The first few times (or minutes of the session) may just be about putting it at the entrance without inserting, and that's ok! We have also had patients find success with an external vibrator on their pubic bone while they use trainers as this can help distract nerves and increase muscle relaxation. This can be helpful to normalize the trainers. Leave the one you are currently using and the one you want to progress to somewhere you see it every day, like the bathroom counter or the bedside table. Seeing them every day can make them less intimidating. The more you do something, the more routine it becomes, so setting a vaginal trainers schedule and sticking to it can help make it less intimidating. Last thing that could be helpful to you is to set yourself up a relaxing environment when you use your vaginal trainers. Play your favorite calming music, light your favorite candle, incense, or turn on your diffuser, wear your coziest t-shirt and socks, prop your legs on pillows, anything that feels like a big exhale. Don't distract yourself with tv or a movie. Stay tuned into your body to help maintain relaxation Listening to relaxing music or meditations can also be helpful. Preferences for guided meditations can be so different from person to person so find one you feel relaxed/safe with!      Copyright 2023 [employer]   

## 2021-06-17 NOTE — Therapy (Addendum)
[place] @ [place], [place], [address] Phone: [phone]   Fax:  [phone]  Physical Therapy Treatment  Patient Details  Name: LAYTON TAPPAN MRN: [id number] Date of Birth: [birth year] Referring Provider (PT): [name] Lark, MD   Encounter Date: 06/17/2021   PT End of Session - 06/17/21 1104     Visit Number 2    Date for PT Re-Evaluation 08/27/21    Authorization Type BCBS    PT Start Time 1017    PT Stop Time 1057    PT Time Calculation (min) 40 min    Activity Tolerance Patient tolerated treatment well    Behavior During Therapy [facility] for tasks assessed/performed             Past Medical History:  Diagnosis Date   Anxiety state, unspecified    Diverticulosis    Esophageal reflux    Family history of breast cancer    Family history of lymphoma    Family history of stomach cancer    Family history of uterine cancer    Fibroid tumor    Headache    due to allergies    Hiatal hernia    Hyperlipidemia    IBS (irritable bowel syndrome)    Internal hemorrhoids    Nonspecific elevation of levels of transaminase or lactic acid dehydrogenase (LDH)    ovarian ca dx'd 11/2018   Renal stone 07/2013   Tubulovillous adenoma of colon    Varicose veins    superficail thrombophlebitis (left LE)    Past Surgical History:  Procedure Laterality Date   CATARACT EXTRACTION, BILATERAL     cystoscopy with instillation for cystitis  2001   DEBULKING N/A 02/12/2019   Procedure: DEBULKING OF TUMOR;  Surgeon: Everitt Amber, MD;  [facility];  Service: Gynecology;  Laterality: N/A;   DILATION AND CURETTAGE OF UTERUS     miscarriage   HYSTERECTOMY ABDOMINAL WITH SALPINGO-OOPHORECTOMY Bilateral 02/12/2019   Procedure: HYSTERECTOMY ABDOMINAL WITH SALPINGO-OOPHORECTOMY;  Surgeon: Everitt Amber, MD;  [facility];  Service: Gynecology;  Laterality: Bilateral;   IR IMAGING GUIDED PORT INSERTION  12/06/2018   IR  IMAGING GUIDED PORT INSERTION  10/10/2019   IR PARACENTESIS  12/03/2018   IR PARACENTESIS  12/14/2018   IR REMOVAL TUN ACCESS W/ PORT W/O FL MOD SED  06/03/2019   LAPAROSCOPY     adhesions   LITHOTRIPSY  07/2013   x 2   MYOMECTOMY  1992   OMENTECTOMY N/A 02/12/2019   Procedure: OMENTECTOMY;  Surgeon: Everitt Amber, MD;  [facility];  Service: Gynecology;  Laterality: N/A;   PARACENTESIS  11/21/2018   abdominal , removed 3.1 Liters     There were no vitals filed for this visit.   Subjective Assessment - 06/17/21 1102     Subjective I think the bathroom has been better just being aware.  I have been doing exercises    Currently in Pain? No/denies                               [facility] Adult PT Treatment/Exercise - 06/17/21 0001       Self-Care   Self-Care Other Self-Care Comments    Other Self-Care Comments  pool noodle or twel massge to perineum; toileting techniques      Exercises   Exercises Lumbar      Lumbar Exercises: Stretches   Other Lumbar Stretch  Exercise child pose with threading    Other Lumbar Stretch Exercise thoracic ext with noodle      Lumbar Exercises: Quadruped   [place] 10 reps                     PT Education - 06/17/21 1059     Education Details toilet techniques, added stretches to medbridge, vaginal training handout    Person(s) Educated Patient    Methods Explanation;Demonstration;Tactile cues;Verbal cues;Handout    Comprehension Verbalized understanding;Returned demonstration              PT Short Term Goals - 06/17/21 1102       PT SHORT TERM GOAL #1   Title Pt will be independent with her inital HEP    Status Achieved               PT Long Term Goals - 06/04/21 0929       PT LONG TERM GOAL #1   Title Pt will be ind with advanced HEP    Time 12    Period Weeks    Status New    Target Date 08/27/21      PT LONG TERM GOAL #2   Title Pt will report there is no fecal smearing due to  improved BMs.    Time 12    Period Weeks    Status New    Target Date 08/27/21      PT LONG TERM GOAL #3   Title Pt will report she is having a complete BM without straining    Time 12    Period Weeks    Status New    Target Date 08/27/21      PT LONG TERM GOAL #4   Title Pt will be ind and understand how to use dilators to return to penetrative intercourse when she is ready    Time 12    Period Weeks    Status New    Target Date 08/27/21      PT LONG TERM GOAL #5   Title Pt will report 50% less instances of abdominal discomfort or hemorrhoid flare ups    Time 12    Period Weeks    Status New    Target Date 08/27/21                   Plan - 06/17/21 1040     Clinical Impression Statement Pt at first visit since eval.  Pt did well with stretches and exercises.  Pt was educated on toileting techniques and was able to feel the improved release of the pelvic floor.  Pt will benefit from skilled PT to continue to work towards goals    PT Treatment/Interventions ADLs/Self Care Home Management;Biofeedback;Cryotherapy;Electrical Stimulation;Moist Heat;Therapeutic activities;Therapeutic exercise;Neuromuscular re-education;Patient/family education;Manual techniques;Passive range of motion;Dry needling;Taping    PT Next Visit Plan internal STM to work on vaginal relase and educate on dilators, lumbar and thoracic STM, review HEP as needed    PT Home Exercise Plan Access Code: [email or web address]    Consulted and Agree with Plan of Care Patient             Patient will benefit from skilled therapeutic intervention in order to improve the following deficits and impairments:  Impaired flexibility, Increased muscle spasms, Decreased coordination, Decreased strength, Decreased endurance, Pain  Visit Diagnosis: Cramp and spasm  Muscle weakness (generalized)     Problem List Patient Active Problem List   Diagnosis Date Noted  Pelvic floor dysfunction in female 04/05/2021   Leg  cramps 11/02/2020   Bilateral bunions 09/08/2020   Mucositis due to antineoplastic therapy 08/04/2020   Anorexia 08/04/2020   Chemotherapy-induced fatigue 05/04/2020   Foot pain, bilateral 05/04/2020   Chemotherapy-induced nausea 03/09/2020   Vaginal dryness, menopausal 02/10/2020   Thrombocytopenia ([place]) 10/22/2019   Skin rash 10/14/2019   Foreign body reaction of the skin 07/02/2019   Genetic testing 04/25/2019   Hyperglycemia, drug-induced 04/01/2019   Elevated liver enzymes 04/01/2019   Family history of breast cancer    Family history of uterine cancer    Family history of lymphoma    Family history of stomach cancer    Preventive measure 03/05/2019   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   Anemia due to antineoplastic chemotherapy 01/17/2019   Peritoneal carcinomatosis ([place]) 11/30/2018   Goals of care, counseling/discussion 11/30/2018   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   Uterine leiomyoma 02/28/2017   Vitreous syneresis, bilateral 01/30/2017   Conjunctivochalasis, bilateral 01/30/2017   Dry eyes, bilateral 01/30/2017   Dermatochalasis of eyelids of both eyes 01/30/2017   Combined forms of age-related cataract, bilateral 01/30/2017   FLATULENCE-GAS-BLOATING 05/29/2008   ABDOMINAL PAIN RIGHT UPPER QUADRANT 05/29/2008   ABNORMAL TRANSAMINASE-LFT'S 05/29/2008   Anxiety state 05/28/2008   GERD 05/28/2008   HIATAL HERNIA 05/28/2008   IBS 05/28/2008   Hiatal hernia 05/28/2008    Camillo Flaming Christiann Hagerty, PT 06/17/2021, 11:04 AM  [facility] @ [place], [place], [address] Phone: [phone]   Fax:  [phone]  Name: GWENNETH WHITEMAN MRN: [id number] Date of Birth: 06/29/1955  PHYSICAL THERAPY DISCHARGE SUMMARY  Visits from Start of Care: 2  Current functional level related to goals / functional outcomes:   See above goals Remaining deficits:   See above  details Education / Equipment: HEP   Patient agrees to discharge. Patient goals were not met. Patient is being discharged due to not returning since the last visit.  Gustavus Bryant, PT 11/04/21 9:23 AM

## 2021-06-24 ENCOUNTER — Encounter: Payer: BC Managed Care – PPO | Admitting: Physical Therapy

## 2021-07-01 ENCOUNTER — Encounter: Payer: BC Managed Care – PPO | Admitting: Physical Therapy

## 2021-07-05 ENCOUNTER — Encounter: Payer: BC Managed Care – PPO | Admitting: Physical Therapy

## 2021-07-05 ENCOUNTER — Other Ambulatory Visit (HOSPITAL_COMMUNITY): Payer: Self-pay

## 2021-07-05 MED FILL — Niraparib Tosylate Cap 100 MG (Base Equivalent): ORAL | 15 days supply | Qty: 15 | Fill #10 | Status: CN

## 2021-07-05 MED FILL — Niraparib Tosylate Cap 100 MG (Base Equivalent): ORAL | 15 days supply | Qty: 15 | Fill #10 | Status: AC

## 2021-07-09 ENCOUNTER — Other Ambulatory Visit (HOSPITAL_COMMUNITY): Payer: Self-pay

## 2021-07-12 ENCOUNTER — Other Ambulatory Visit (HOSPITAL_COMMUNITY): Payer: Self-pay

## 2021-07-12 ENCOUNTER — Encounter: Payer: BC Managed Care – PPO | Admitting: Physical Therapy

## 2021-07-13 ENCOUNTER — Other Ambulatory Visit (HOSPITAL_COMMUNITY): Payer: Self-pay

## 2021-07-16 ENCOUNTER — Other Ambulatory Visit (HOSPITAL_COMMUNITY): Payer: Self-pay

## 2021-07-16 MED FILL — Niraparib Tosylate Cap 100 MG (Base Equivalent): ORAL | 15 days supply | Qty: 15 | Fill #11 | Status: AC

## 2021-07-19 ENCOUNTER — Encounter: Payer: BC Managed Care – PPO | Admitting: Physical Therapy

## 2021-07-19 DIAGNOSIS — H26492 Other secondary cataract, left eye: Secondary | ICD-10-CM | POA: Diagnosis not present

## 2021-07-20 ENCOUNTER — Other Ambulatory Visit (HOSPITAL_COMMUNITY): Payer: Self-pay

## 2021-07-26 ENCOUNTER — Inpatient Hospital Stay (HOSPITAL_BASED_OUTPATIENT_CLINIC_OR_DEPARTMENT_OTHER): Payer: BC Managed Care – PPO | Admitting: Hematology and Oncology

## 2021-07-26 ENCOUNTER — Inpatient Hospital Stay: Payer: BC Managed Care – PPO | Attending: Gynecologic Oncology

## 2021-07-26 ENCOUNTER — Other Ambulatory Visit: Payer: Self-pay

## 2021-07-26 ENCOUNTER — Telehealth: Payer: Self-pay

## 2021-07-26 ENCOUNTER — Encounter: Payer: Self-pay | Admitting: Hematology and Oncology

## 2021-07-26 VITALS — BP 171/87 | HR 91 | Temp 98.2°F | Resp 18 | Ht 62.0 in | Wt 153.8 lb

## 2021-07-26 DIAGNOSIS — Z79899 Other long term (current) drug therapy: Secondary | ICD-10-CM | POA: Insufficient documentation

## 2021-07-26 DIAGNOSIS — Z7189 Other specified counseling: Secondary | ICD-10-CM

## 2021-07-26 DIAGNOSIS — C786 Secondary malignant neoplasm of retroperitoneum and peritoneum: Secondary | ICD-10-CM

## 2021-07-26 DIAGNOSIS — R748 Abnormal levels of other serum enzymes: Secondary | ICD-10-CM | POA: Insufficient documentation

## 2021-07-26 DIAGNOSIS — F411 Generalized anxiety disorder: Secondary | ICD-10-CM

## 2021-07-26 DIAGNOSIS — R030 Elevated blood-pressure reading, without diagnosis of hypertension: Secondary | ICD-10-CM | POA: Diagnosis not present

## 2021-07-26 DIAGNOSIS — C562 Malignant neoplasm of left ovary: Secondary | ICD-10-CM | POA: Diagnosis not present

## 2021-07-26 LAB — COMPREHENSIVE METABOLIC PANEL
ALT: 28 U/L (ref 0–44)
AST: 34 U/L (ref 15–41)
Albumin: 4.2 g/dL (ref 3.5–5.0)
Alkaline Phosphatase: 85 U/L (ref 38–126)
Anion gap: 8 (ref 5–15)
BUN: 10 mg/dL (ref 8–23)
CO2: 27 mmol/L (ref 22–32)
Calcium: 9.1 mg/dL (ref 8.9–10.3)
Chloride: 104 mmol/L (ref 98–111)
Creatinine, Ser: 0.66 mg/dL (ref 0.44–1.00)
GFR, Estimated: >60 mL/min (ref >60)
Glucose, Bld: 107 mg/dL — ABNORMAL HIGH (ref 70–99)
Potassium: 3.6 mmol/L (ref 3.5–5.1)
Sodium: 139 mmol/L (ref 135–145)
Total Bilirubin: 1.4 mg/dL — ABNORMAL HIGH (ref 0.3–1.2)
Total Protein: 6.6 g/dL (ref 6.5–8.1)

## 2021-07-26 LAB — CBC WITH DIFFERENTIAL/PLATELET
Abs Immature Granulocytes: 0.02 10*3/uL (ref 0.00–0.07)
Basophils Absolute: 0 10*3/uL (ref 0.0–0.1)
Basophils Relative: 1 %
Eosinophils Absolute: 0.1 10*3/uL (ref 0.0–0.5)
Eosinophils Relative: 1 %
HCT: 39.1 % (ref 36.0–46.0)
Hemoglobin: 13.8 g/dL (ref 12.0–15.0)
Immature Granulocytes: 0 %
Lymphocytes Relative: 21 %
Lymphs Abs: 1.2 10*3/uL (ref 0.7–4.0)
MCH: 33.4 pg (ref 26.0–34.0)
MCHC: 35.3 g/dL (ref 30.0–36.0)
MCV: 94.7 fL (ref 80.0–100.0)
Monocytes Absolute: 0.5 10*3/uL (ref 0.1–1.0)
Monocytes Relative: 8 %
Neutro Abs: 3.9 10*3/uL (ref 1.7–7.7)
Neutrophils Relative %: 69 %
Platelets: 228 10*3/uL (ref 150–400)
RBC: 4.13 MIL/uL (ref 3.87–5.11)
RDW: 12.4 % (ref 11.5–15.5)
WBC: 5.7 10*3/uL (ref 4.0–10.5)
nRBC: 0 % (ref 0.0–0.2)

## 2021-07-26 MED ORDER — HEPARIN SOD (PORK) LOCK FLUSH 100 UNIT/ML IV SOLN
500.0000 [IU] | Freq: Once | INTRAVENOUS | Status: AC
  Administered 2021-07-26: 500 [IU]

## 2021-07-26 MED ORDER — SODIUM CHLORIDE 0.9% FLUSH
10.0000 mL | Freq: Once | INTRAVENOUS | Status: AC
  Administered 2021-07-26: 10 mL

## 2021-07-26 NOTE — Progress Notes (Signed)
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[facility]) She tolerated chemotherapy fairly well Her blood counts are within normal limits Her chronic elevated bilirubin is unrelated Her last CT imaging and tumor marker are completely normal The plan will be to continue this indefinitely She will continue niraparib as prescribed I plan to repeat imaging study next month for further follow-up  Anxiety state Her blood pressure is elevated today likely due to anxiety We discussed conservative approach I also recommend the patient to start checking blood pressure at home  Elevated liver enzymes She has chronic elevated bilirubin Observe closely for now  Orders Placed This Encounter  Procedures   CT ABDOMEN PELVIS W CONTRAST    Standing Status:   Future    Standing Expiration Date:   07/27/2022    Order Specific Question:   If indicated for the ordered procedure, I authorize the administration of contrast media per Radiology protocol    Answer:   Yes    Order Specific Question:   Preferred imaging location?    Answer:   [facility]    Order Specific Question:   Radiology Contrast Protocol - do NOT remove file path    Answer:   [email or web address]   CA 125    Standing Status:   Standing    Number of Occurrences:   22    Standing Expiration Date:   07/27/2022    All questions were answered. The patient knows to call the clinic with any problems, questions or concerns. The total time spent in the appointment was 30 minutes encounter with patients including review of chart and various tests results, discussions about plan of care and coordination of care plan   [name] Lark, MD 07/26/2021 9:24 AM  INTERVAL  HISTORY: Please see below for problem oriented charting. she returns for treatment follow-up on niraparib for recurrent ovarian cancer She is doing well Her blood pressure at home is usually within normal range The patient has anxiety when she is here at the cancer center Denies abdominal pain, bloating or changes in bowel habits No recent infection, fever or chills  REVIEW OF SYSTEMS:   Constitutional: Denies fevers, chills or abnormal weight loss Eyes: Denies blurriness of vision Ears, nose, mouth, throat, and face: Denies mucositis or sore throat Respiratory: Denies cough, dyspnea or wheezes Cardiovascular: Denies palpitation, chest discomfort or lower extremity swelling Gastrointestinal:  Denies nausea, heartburn or change in bowel habits Skin: Denies abnormal skin rashes Lymphatics: Denies new lymphadenopathy or easy bruising Neurological:Denies numbness, tingling or new weaknesses Behavioral/Psych: Mood is stable, no new changes  All other systems were reviewed with the patient and are negative.  I have reviewed the past medical history, past surgical history, social history and family history with the patient and they are unchanged from previous note.  ALLERGIES:  is allergic to skin adhesives [cyanoacrylate], shrimp [shellfish allergy], and sulfonamide derivatives.  MEDICATIONS:  Current Outpatient Medications  Medication Sig Dispense Refill   ALPRAZolam (XANAX) 0.25 MG tablet Take 1 tablet (0.25 mg total) by mouth at bedtime as needed for anxiety. 30 tablet 0   carboxymethylcellulose (REFRESH PLUS) 0.5 % SOLN Place 1 drop into both eyes 2 (two) times daily as needed (dry eyes).     Cholecalciferol (VITAMIN D3) 25 MCG (1000 UT)  CAPS Take 1,000 Units by mouth daily.      esomeprazole (NEXIUM) 20 MG capsule Take 20 mg by mouth daily.      estradiol (ESTRACE VAGINAL) 0.1 MG/GM vaginal cream Place 1 Applicatorful vaginally 3 (three) times a week. 42.5 g 12   loratadine  (CLARITIN) 10 MG tablet Take 10 mg by mouth daily as needed for allergies.     Multiple Vitamin (MULTIVITAMIN WITH MINERALS) TABS tablet Take 1 tablet by mouth daily.     niraparib tosylate (ZEJULA) 100 MG capsule TAKE 1 CAPSULE (100 MG TOTAL) BY MOUTH DAILY. MAY TAKE AT BEDTIME TO REDUCE NAUSEA AND VOMITING. 30 capsule 11   prochlorperazine (COMPAZINE) 10 MG tablet TAKE 1 TABLET BY MOUTH EVERY 6 HOURS AS NEEDED FOR NAUSEA OR VOMITING 30 tablet 2   No current facility-administered medications for this visit.    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 14.7 cm poorly defined soft tissue mass in central pelvis suspicious for primary ovarian carcinoma, with uterine leiomyosarcoma considered a less likely differential diagnosis. 2. Moderate ascites and diffuse peritoneal carcinomatosis. 3. Several small uterine fibroids.   11/13/2018 Tumor Marker   Patient's tumor was tested for the following markers: CA-125 Results of the tumor marker test revealed 1434   11/21/2018 Procedure   Successful ultrasound-guided diagnostic and therapeutic paracentesis yielding 3.1 liters of peritoneal fluid   11/21/2018 Pathology Results   PERITONEAL/ASCITIC FLUID(SPECIMEN 1 OF 1 COLLECTED 11/21/18): ADENOCARCINOMA. Specimen Clinical Information Pelvic mass suspicious for ovarian cancer Source Peritoneal/Ascitic Fluid, (specimen 1 of 1 collected 11/21/18) Gross Specimen: Received is/are 1000 ccs of dark amber fluid. (CM:cm) Prepared: # Smears: 0 # Concentration Technique Slides (i.e. 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   11/29/2018 Initial Diagnosis   Ovarian cancer ([place])   11/29/2018 Cancer Staging   Staging form: Ovary, Fallopian Tube, and Primary Peritoneal Carcinoma, AJCC  8th Edition - Clinical: cT3, cN0, cM0 - Signed by [name] Lark, MD on 11/29/2018    12/03/2018 Procedure   Successful ultrasound-guided therapeutic paracentesis yielding 3.7 liters of peritoneal fluid.     12/06/2018 Procedure   Placement of a subcutaneous port device. Catheter tip at the SVC and right atrium junction   12/14/2018 Procedure   Successful ultrasound-guided paracentesis yielding 2.9 L of peritoneal fluid   12/28/2018 Tumor Marker   Patient's tumor was tested for the following markers: CA-125 Results of the tumor marker test revealed 812.   12/28/2018 - 04/22/2019 Chemotherapy   The patient had carboplatin and taxol for neoadjuvant treatment, followed by interval debulking surgery and subsequent adjuvant chemotherapy treatment.     02/01/2019 Imaging   Ct abdomen and pelvis 8.6 cm left ovarian mass, corresponding to the patient's known primary neoplasm, improved.   Mild peritoneal nodularity/omental caking, improved.   Small abdominopelvic ascites, improved.     02/01/2019 Tumor Marker   Patient's tumor was tested for the following markers: CA-125 Results of the tumor marker test revealed 183.   02/12/2019 Surgery   Preoperative Diagnosis: Stage IIIC ovarian cancer, s/p neoadjuvant chemotherapy      Procedure(s) Performed: 1. Exploratory laparotomy with total abdominal hysterectomy, bilateral salpingo-oophorectomy, omentectomy radical tumor debulking for ovarian cancer.   Surgeon: Thereasa Solo, MD.    Operative Findings: upper abdomen free of disease. No visible  omental disease. Small volume (200cc) ascites. 8cm friable mass replacing left ovary and adherent to the sigmoid colon mesentery and ureter on the left.  Anterior fibroid. This represented an optimal cytoreduction (R0) with no gross visible disease remaining.    02/12/2019 Pathology Results   A. OVARY AND FALLOPIAN TUBE, LEFT, SALPINGO-OOPHORECTOMY: - Serous carcinoma, high grade, status post neoadjuvant  therapy - See oncology table and comment below B. UTERUS CERVIX WITH RIGHT FALLOPIAN TUBE AND OVARY, HYSTERECTOMY: Uterus: - Serosal surface involved by serous carcinoma - Endomyometrium uninvolved by carcinoma - Benign endometrial polyp (4.1 cm) - Leiomyomata (5.5 cm; largest) - Adenomyosis Cervix: - Uninvolved by carcinoma Left ovary: - Serous carcinoma, high grade Left fallopian tube: - Serous carcinoma, high grade C. SOFT TISSUE, LEFT PELVIC SIDEWALL TUMOR, EXCISION: - Metastatic serous carcinoma, high-grade D. SOFT TISSUE, SIGMOID COLON MESENTERY, EXCISION: - Metastatic serous carcinoma, high-grade E. OMENTUM, TUMOR RESECTION: - Metastatic serous carcinoma, high-grade OVARY or FALLOPIAN TUBE or PRIMARY PERITONEUM: Procedure: Total hysterectomy and bilateral salpingo-oophorectomy, omentectomy and peritoneal biopsies Specimen Integrity: Fragmented Tumor Site: Left ovary and fallopian tube Ovarian Surface Involvement: Present Fallopian Tube Surface Involvement: Present Tumor Size: 6.3 cm (see comment) Histologic Type: Serous carcinoma Histologic Grade: High-grade Implants: Not applicable Other Tissue/ Organ Involvement: Right ovary, right fallopian tube, omentum, mesentery Largest Extrapelvic Peritoneal Focus: Macroscopic Peritoneal/Ascitic Fluid: Malignant Treatment Effect: No definite or minimal response identified (chemotherapy response score 1 [CRS1] Regional Lymph Nodes: No lymph nodes submitted or found Pathologic Stage Classification (pTNM, AJCC 8th Edition): pT3c, pNX Representative Tumor Block: A4 Comment(s): Additional testing (HER-2, MMR and MSI) are pending. The primary tumor site appears to be the left ovary and fallopian tube. The uterus is only involved on the serosal surface. There is tumor on the anterior peritoneal reflection.  Addendum: Tumor is Her2 negative,MSI stable   02/19/2019 Tumor Marker   Patient's tumor was tested for the following markers:  CA-125 Results of the tumor marker test revealed 40.3   04/01/2019 Tumor Marker   Patient's tumor was tested for the following markers: CA-125 Results of the tumor marker test revealed 8.1    Genetic Testing   Negative testing. No pathogenic variants identified on the [employer]. The report date is 04/17/2019.  Somatic genes analyzed through [facility]: ATM, BARD1, BRCA1, BRCA2, BRIP1, CHEK2, MRE11A, NBN, PALB2, RAD51C, RAD51D.  The CancerNext gene panel offered by [employer] includes sequencing and rearrangement analysis for the following 36 genes: APC*, ATM*, AXIN2, BARD1, BMPR1A, BRCA1*, BRCA2*, BRIP1*, CDH1*, CDK4, CDKN2A, CHEK2*, DICER1, MLH1*, MSH2*, MSH3, MSH6*, MUTYH*, NBN, NF1*, NTHL1, PALB2*, PMS2*, PTEN*, RAD51C*, RAD51D*, RECQL, SMAD4, SMARCA4, STK11 and TP53* (sequencing and deletion/duplication); HOXB13, POLD1 and POLE (sequencing only); EPCAM and GREM1 (deletion/duplication only).    05/21/2019 Imaging   1. Interval hysterectomy, oophorectomy and omentectomy. No evidence of residual ovarian carcinoma in the pelvis. No evidence of peritoneal disease. No intraperitoneal free fluid. 2. Nonobstructing LEFT renal calculi.  Normal ureters.   05/22/2019 Tumor Marker   Patient's tumor was tested for the following markers: CA-125 Results of the tumor marker test revealed 5.7.   06/03/2019 Procedure   Successful right IJ vein Port-A-Cath explant.   08/26/2019 Tumor Marker   Patient's tumor was tested for the following markers: CA-125. Results of the tumor marker test revealed 4.9   10/03/2019 Imaging   1. New tumor deposits along the capsular surfaces of the liver, spleen, in the left pelvis, and right paracolic gutter, compatible with metastatic disease. 2. Mild dilation of  the dorsal pancreatic duct in the pancreatic body and head, cause uncertain. 3. Nonobstructive left nephrolithiasis. 4. Aortic atherosclerosis.   Aortic Atherosclerosis  (ICD10-I70.0   10/10/2019 Procedure   Successful placement of a right IJ approach Power Port with ultrasound and fluoroscopic guidance. The catheter is ready for use.     10/11/2019 Tumor Marker   Patient's tumor was tested for the following markers: CA-125 Results of the tumor marker test revealed 19.6.   10/14/2019 -  Chemotherapy   The patient had carboplatin and gemzar for chemotherapy treatment.     11/04/2019 Tumor Marker   Patient's tumor was tested for the following markers: CA-125 Results of the tumor marker test revealed 11.4   11/28/2019 Tumor Marker   Patient's tumor was tested for the following markers: CA-125 Results of the tumor marker test revealed 5.8   12/20/2019 Imaging   1. Significant interval reduction in mixed solid and cystic nodule in the vicinity of the left ovary, with significant improvement or resolution of multiple pelvic, peritoneal, and organ capsule implants. Resolution of previously seen small volume ascites. Findings are consistent with treatment response of abdominal metastatic disease. 2. Status post hysterectomy, oophorectomy, and omentectomy. 3. Nonobstructive left nephrolithiasis. 4. Aortic Atherosclerosis (ICD10-I70.0).   12/23/2019 Tumor Marker   Patient's tumor was tested for the following markers: CA-125 Results of the tumor marker test revealed 5.0   01/24/2020 Tumor Marker   Patient's tumor was tested for the following markers: CA-125 Results of the tumor marker test revealed 4.8   02/24/2020 Tumor Marker   Patient's tumor was tested for the following markers: CA-125. Results of the tumor marker test revealed 4.3.   03/06/2020 Imaging   1. Status post hysterectomy, bilateral oophorectomy, and omentectomy. 2. Resolution of previously described left adnexal nodularity. 3. No residual disease identified. 4.  Aortic Atherosclerosis (ICD10-I70.0). 5. Left nephrolithiasis.   04/20/2020 Tumor Marker   Patient's tumor was tested for the  following markers: CA-125 Results of the tumor marker test revealed 4.6   05/18/2020 Tumor Marker   Patient's tumor was tested for the following markers: CA-125 Results of the tumor marker test revealed 4.7   05/29/2020 Imaging   Status post hysterectomy and bilateral salpingo oophorectomy.   No evidence of recurrent or metastatic disease.   06/08/2020 - 06/08/2020 Chemotherapy   She is taking niraparib        06/17/2020 Tumor Marker   Patient's tumor was tested for the following markers: CA-125 Results of the tumor marker test revealed 4.5   08/04/2020 Tumor Marker   Patient's tumor was tested for the following markers: CA-125. Results of the tumor marker test revealed 5.7   09/07/2020 Imaging   1. Status post hysterectomy and bilateral salpingo oophorectomy. No evidence of recurrent or metastatic disease. 2. Nonobstructing left nephrolithiasis. 3. Left-sided colonic diverticulosis without findings of acute diverticulitis. 4. Aortic atherosclerosis.     09/07/2020 Tumor Marker   Patient's tumor was tested for the following markers: CA-125 Results of the tumor marker test revealed 4.8.   10/21/2020 Tumor Marker   Patient's tumor was tested for the following markers: CA-125. Results of the tumor marker test revealed 5.1.   12/14/2020 Tumor Marker   Patient's tumor was tested for the following markers: CA-125. Results of the tumor marker test revealed 5.5.   04/05/2021 Tumor Marker   Patient's tumor was tested for the following markers: CA-125. Results of the tumor marker test revealed 5.9.   05/31/2021 Tumor Marker   Patient's tumor was  tested for the following markers: CA-125. Results of the tumor marker test revealed 8.1.     PHYSICAL EXAMINATION: ECOG PERFORMANCE STATUS: 0 - Asymptomatic  Vitals:   07/26/21 0821  BP: (!) 171/87  Pulse: 91  Resp: 18  Temp: 98.2 F (36.8 C)  SpO2: 100%   Filed Weights   07/26/21 0821  Weight: 153 lb 12.8 oz (69.8 kg)     GENERAL:alert, no distress and comfortable SKIN: skin color, texture, turgor are normal, no rashes or significant lesions EYES: normal, Conjunctiva are pink and non-injected, sclera clear OROPHARYNX:no exudate, no erythema and lips, buccal mucosa, and tongue normal  NECK: supple, thyroid normal size, non-tender, without nodularity LYMPH:  no palpable lymphadenopathy in the cervical, axillary or inguinal LUNGS: clear to auscultation and percussion with normal breathing effort HEART: regular rate & rhythm and no murmurs and no lower extremity edema ABDOMEN:abdomen soft, non-tender and normal bowel sounds Musculoskeletal:no cyanosis of digits and no clubbing  NEURO: alert & oriented x 3 with fluent speech, no focal motor/sensory deficits  LABORATORY DATA:  I have reviewed the data as listed    Component Value Date/Time   NA 139 07/26/2021 0754   K 3.6 07/26/2021 0754   CL 104 07/26/2021 0754   CO2 27 07/26/2021 0754   GLUCOSE 107 (H) 07/26/2021 0754   BUN 10 07/26/2021 0754   CREATININE 0.66 07/26/2021 0754   CREATININE 0.75 06/24/2020 1413   CALCIUM 9.1 07/26/2021 0754   PROT 6.6 07/26/2021 0754   ALBUMIN 4.2 07/26/2021 0754   AST 34 07/26/2021 0754   AST 31 06/24/2020 1413   ALT 28 07/26/2021 0754   ALT 23 06/24/2020 1413   ALKPHOS 85 07/26/2021 0754   BILITOT 1.4 (H) 07/26/2021 0754   BILITOT 1.9 (H) 06/24/2020 1413   GFRNONAA >60 07/26/2021 0754   GFRNONAA >60 06/24/2020 1413   GFRAA >60 02/24/2020 0852    No results found for: SPEP, UPEP  Lab Results  Component Value Date   WBC 5.7 07/26/2021   NEUTROABS 3.9 07/26/2021   HGB 13.8 07/26/2021   HCT 39.1 07/26/2021   MCV 94.7 07/26/2021   PLT 228 07/26/2021      Chemistry      Component Value Date/Time   NA 139 07/26/2021 0754   K 3.6 07/26/2021 0754   CL 104 07/26/2021 0754   CO2 27 07/26/2021 0754   BUN 10 07/26/2021 0754   CREATININE 0.66 07/26/2021 0754   CREATININE 0.75 06/24/2020 1413       Component Value Date/Time   CALCIUM 9.1 07/26/2021 0754   ALKPHOS 85 07/26/2021 0754   AST 34 07/26/2021 0754   AST 31 06/24/2020 1413   ALT 28 07/26/2021 0754   ALT 23 06/24/2020 1413   BILITOT 1.4 (H) 07/26/2021 0754   BILITOT 1.9 (H) 06/24/2020 1413

## 2021-07-26 NOTE — Telephone Encounter (Signed)
Called and left message that orders for the CA-125 ran out. Lab did not get the CA-125 today. We can do it next month. ?Please call the office for questions. ?

## 2021-07-26 NOTE — Assessment & Plan Note (Signed)
She tolerated chemotherapy fairly well ?Her blood counts are within normal limits ?Her chronic elevated bilirubin is unrelated ?Her last CT imaging and tumor marker are completely normal ?The plan will be to continue this indefinitely ?She will continue niraparib as prescribed ?I plan to repeat imaging study next month for further follow-up ?

## 2021-07-26 NOTE — Assessment & Plan Note (Signed)
Her blood pressure is elevated today likely due to anxiety ?We discussed conservative approach ?I also recommend the patient to start checking blood pressure at home ?

## 2021-07-26 NOTE — Assessment & Plan Note (Signed)
She has chronic elevated bilirubin ?Observe closely for now ?

## 2021-07-28 ENCOUNTER — Encounter: Payer: BC Managed Care – PPO | Admitting: Physical Therapy

## 2021-07-30 ENCOUNTER — Other Ambulatory Visit (HOSPITAL_COMMUNITY): Payer: Self-pay

## 2021-08-02 ENCOUNTER — Other Ambulatory Visit (HOSPITAL_COMMUNITY): Payer: Self-pay

## 2021-08-02 MED FILL — Niraparib Tosylate Cap 100 MG (Base Equivalent): ORAL | 15 days supply | Qty: 15 | Fill #12 | Status: AC

## 2021-08-03 ENCOUNTER — Other Ambulatory Visit (HOSPITAL_COMMUNITY): Payer: Self-pay

## 2021-08-10 ENCOUNTER — Other Ambulatory Visit (HOSPITAL_COMMUNITY): Payer: Self-pay

## 2021-08-10 ENCOUNTER — Other Ambulatory Visit: Payer: Self-pay | Admitting: Hematology and Oncology

## 2021-08-10 MED ORDER — ZEJULA 100 MG PO CAPS
ORAL_CAPSULE | ORAL | 11 refills | Status: DC
  Filled 2021-08-10: qty 30, 30d supply, fill #0
  Filled 2021-09-08: qty 30, 30d supply, fill #1

## 2021-08-16 ENCOUNTER — Other Ambulatory Visit (HOSPITAL_COMMUNITY): Payer: Self-pay

## 2021-08-17 ENCOUNTER — Encounter: Payer: BC Managed Care – PPO | Admitting: Physical Therapy

## 2021-08-20 ENCOUNTER — Other Ambulatory Visit (HOSPITAL_COMMUNITY): Payer: Self-pay

## 2021-08-25 DIAGNOSIS — E785 Hyperlipidemia, unspecified: Secondary | ICD-10-CM | POA: Diagnosis not present

## 2021-09-01 ENCOUNTER — Other Ambulatory Visit: Payer: Self-pay | Admitting: Internal Medicine

## 2021-09-01 DIAGNOSIS — Z0000 Encounter for general adult medical examination without abnormal findings: Secondary | ICD-10-CM | POA: Diagnosis not present

## 2021-09-01 DIAGNOSIS — Z1212 Encounter for screening for malignant neoplasm of rectum: Secondary | ICD-10-CM | POA: Diagnosis not present

## 2021-09-01 DIAGNOSIS — R82998 Other abnormal findings in urine: Secondary | ICD-10-CM | POA: Diagnosis not present

## 2021-09-01 DIAGNOSIS — R030 Elevated blood-pressure reading, without diagnosis of hypertension: Secondary | ICD-10-CM | POA: Diagnosis not present

## 2021-09-01 DIAGNOSIS — Z1331 Encounter for screening for depression: Secondary | ICD-10-CM | POA: Diagnosis not present

## 2021-09-01 DIAGNOSIS — E785 Hyperlipidemia, unspecified: Secondary | ICD-10-CM

## 2021-09-01 DIAGNOSIS — Z1339 Encounter for screening examination for other mental health and behavioral disorders: Secondary | ICD-10-CM | POA: Diagnosis not present

## 2021-09-02 ENCOUNTER — Other Ambulatory Visit (HOSPITAL_COMMUNITY): Payer: Self-pay

## 2021-09-06 ENCOUNTER — Other Ambulatory Visit (HOSPITAL_COMMUNITY): Payer: Self-pay

## 2021-09-08 ENCOUNTER — Other Ambulatory Visit (HOSPITAL_COMMUNITY): Payer: Self-pay

## 2021-09-13 ENCOUNTER — Telehealth: Payer: Self-pay

## 2021-09-13 ENCOUNTER — Other Ambulatory Visit (HOSPITAL_COMMUNITY): Payer: Self-pay

## 2021-09-13 ENCOUNTER — Other Ambulatory Visit: Payer: Self-pay

## 2021-09-13 ENCOUNTER — Encounter: Payer: Self-pay | Admitting: Hematology and Oncology

## 2021-09-13 ENCOUNTER — Inpatient Hospital Stay: Payer: BC Managed Care – PPO | Admitting: Emergency Medicine

## 2021-09-13 ENCOUNTER — Inpatient Hospital Stay (HOSPITAL_BASED_OUTPATIENT_CLINIC_OR_DEPARTMENT_OTHER): Payer: BC Managed Care – PPO | Admitting: Hematology and Oncology

## 2021-09-13 ENCOUNTER — Inpatient Hospital Stay: Payer: BC Managed Care – PPO | Attending: Gynecologic Oncology

## 2021-09-13 ENCOUNTER — Ambulatory Visit (HOSPITAL_COMMUNITY)
Admission: RE | Admit: 2021-09-13 | Discharge: 2021-09-13 | Disposition: A | Discharge status: Home / Self Care | Payer: BC Managed Care – PPO | Source: Ambulatory Visit | Attending: Hematology and Oncology | Admitting: Hematology and Oncology

## 2021-09-13 VITALS — BP 176/80 | HR 105 | Resp 18 | Ht 62.0 in

## 2021-09-13 DIAGNOSIS — Z7189 Other specified counseling: Secondary | ICD-10-CM | POA: Diagnosis not present

## 2021-09-13 DIAGNOSIS — K7689 Other specified diseases of liver: Secondary | ICD-10-CM | POA: Diagnosis not present

## 2021-09-13 DIAGNOSIS — Z79899 Other long term (current) drug therapy: Secondary | ICD-10-CM | POA: Diagnosis not present

## 2021-09-13 DIAGNOSIS — C786 Secondary malignant neoplasm of retroperitoneum and peritoneum: Secondary | ICD-10-CM | POA: Diagnosis not present

## 2021-09-13 DIAGNOSIS — G620 Drug-induced polyneuropathy: Secondary | ICD-10-CM | POA: Insufficient documentation

## 2021-09-13 DIAGNOSIS — C562 Malignant neoplasm of left ovary: Secondary | ICD-10-CM | POA: Insufficient documentation

## 2021-09-13 DIAGNOSIS — N281 Cyst of kidney, acquired: Secondary | ICD-10-CM | POA: Diagnosis not present

## 2021-09-13 DIAGNOSIS — T451X5A Adverse effect of antineoplastic and immunosuppressive drugs, initial encounter: Secondary | ICD-10-CM | POA: Insufficient documentation

## 2021-09-13 DIAGNOSIS — I700 Atherosclerosis of aorta: Secondary | ICD-10-CM | POA: Insufficient documentation

## 2021-09-13 DIAGNOSIS — C569 Malignant neoplasm of unspecified ovary: Secondary | ICD-10-CM | POA: Diagnosis not present

## 2021-09-13 DIAGNOSIS — N200 Calculus of kidney: Secondary | ICD-10-CM | POA: Diagnosis not present

## 2021-09-13 LAB — CBC WITH DIFFERENTIAL/PLATELET
Abs Immature Granulocytes: 0.01 10*3/uL (ref 0.00–0.07)
Basophils Absolute: 0 10*3/uL (ref 0.0–0.1)
Basophils Relative: 1 %
Eosinophils Absolute: 0.1 10*3/uL (ref 0.0–0.5)
Eosinophils Relative: 2 %
HCT: 39.5 % (ref 36.0–46.0)
Hemoglobin: 13.8 g/dL (ref 12.0–15.0)
Immature Granulocytes: 0 %
Lymphocytes Relative: 23 %
Lymphs Abs: 1.2 10*3/uL (ref 0.7–4.0)
MCH: 33.1 pg (ref 26.0–34.0)
MCHC: 34.9 g/dL (ref 30.0–36.0)
MCV: 94.7 fL (ref 80.0–100.0)
Monocytes Absolute: 0.4 10*3/uL (ref 0.1–1.0)
Monocytes Relative: 8 %
Neutro Abs: 3.4 10*3/uL (ref 1.7–7.7)
Neutrophils Relative %: 66 %
Platelets: 239 10*3/uL (ref 150–400)
RBC: 4.17 MIL/uL (ref 3.87–5.11)
RDW: 12.5 % (ref 11.5–15.5)
WBC: 5.1 10*3/uL (ref 4.0–10.5)
nRBC: 0 % (ref 0.0–0.2)

## 2021-09-13 LAB — COMPREHENSIVE METABOLIC PANEL
ALT: 25 U/L (ref 0–44)
AST: 32 U/L (ref 15–41)
Albumin: 4.2 g/dL (ref 3.5–5.0)
Alkaline Phosphatase: 101 U/L (ref 38–126)
Anion gap: 7 (ref 5–15)
BUN: 9 mg/dL (ref 8–23)
CO2: 27 mmol/L (ref 22–32)
Calcium: 8.9 mg/dL (ref 8.9–10.3)
Chloride: 107 mmol/L (ref 98–111)
Creatinine, Ser: 0.62 mg/dL (ref 0.44–1.00)
GFR, Estimated: >60 mL/min (ref >60)
Glucose, Bld: 104 mg/dL — ABNORMAL HIGH (ref 70–99)
Potassium: 3.7 mmol/L (ref 3.5–5.1)
Sodium: 141 mmol/L (ref 135–145)
Total Bilirubin: 1.8 mg/dL — ABNORMAL HIGH (ref 0.3–1.2)
Total Protein: 6.7 g/dL (ref 6.5–8.1)

## 2021-09-13 MED ORDER — IOHEXOL 300 MG/ML  SOLN
100.0000 mL | Freq: Once | INTRAMUSCULAR | Status: AC | PRN
  Administered 2021-09-13: 100 mL via INTRAVENOUS

## 2021-09-13 MED ORDER — HEPARIN SOD (PORK) LOCK FLUSH 100 UNIT/ML IV SOLN
INTRAVENOUS | Status: AC
  Administered 2021-09-13: 500 [IU]
  Filled 2021-09-13: qty 5

## 2021-09-13 MED ORDER — ONDANSETRON HCL 8 MG PO TABS
8.0000 mg | ORAL_TABLET | Freq: Two times a day (BID) | ORAL | 1 refills | Status: DC | PRN
  Filled 2021-09-13: qty 30, 15d supply, fill #0

## 2021-09-13 MED ORDER — SODIUM CHLORIDE 0.9% FLUSH
10.0000 mL | Freq: Once | INTRAVENOUS | Status: AC
  Administered 2021-09-13: 10 mL

## 2021-09-13 MED ORDER — PROCHLORPERAZINE MALEATE 10 MG PO TABS
10.0000 mg | ORAL_TABLET | Freq: Four times a day (QID) | ORAL | 1 refills | Status: DC | PRN
  Filled 2021-09-13: qty 30, 8d supply, fill #0

## 2021-09-13 NOTE — Assessment & Plan Note (Signed)
We discussed goals of care ?Explained to the patient and her husband why surgery is not indicated ?

## 2021-09-13 NOTE — Assessment & Plan Note (Signed)
I have reviewed multiple imaging studies with the patient and her husband ?Unfortunately, she has progressive neuropathy red ?We will discontinue niraparib ?We discussed the current guidelines ?She had been treated with carboplatin with paclitaxel, carboplatin with gemcitabine and niraparib ?She did not progress on either of the combination chemotherapy ?Based on the current guidelines, I recommend we try carboplatin with Doxil ?I explained to the patient and her husband the strategy ?I will get pretreatment echocardiogram ? ?We reviewed the current guidelines ?The recommendation is based on publication below: ? ?[employer] (2012) 107, [phone] & 2012 [employer] [place] All rights reserved 0007 - 0920/12 ? ?Final overall survival results of phase III [employer] CALYPSO trial of pegylated liposomal doxorubicin and carboplatin vs paclitaxel and carboplatin in platinum-sensitive ovarian cancer patients ? ?BACKGROUND: The CALYPSO phase III trial compared CD (carboplatin-pegylated liposomal doxorubicin (PLD)) with CP (carboplatinpaclitaxel) in patients with platinum-sensitive recurrent ovarian cancer (ROC). Overall survival (OS) data are now mature. ?METHODS: Women with ROC relapsing 46 months after first- or second-line therapy were randomised to CD or CP for six cycles in this international, open-label, non-inferiority trial. The primary endpoint was progression-free survival. The OS analysis is presented here. ?RESULTS: A total of 976 patients were randomised (467 to CD and 509 to CP). With a median follow-up of 49 months, no statistically significant difference was observed between arms in OS (hazard ratio?0.99 (95% confidence interval 0.85, 1.16); log-rank P?0.94). Median survival times were 30.7 months (CD) and 33.0 months (CP). No statistically significant difference in OS was observed between arms in predetermined subgroups according to age, body mass index, treatment-free interval, measurable disease,  number of lines of prior chemotherapy, or performance status. Post-study cross-over was imbalanced between arms, with a greater proportion of patients randomised to CP receiving post-study PLD (68%) than patients randomised to CD receiving post-study paclitaxel (43%; Po0.001). ?CONCLUSION: Carboplatin-PLD led to delayed progression and similar OS compared with carboplatin-paclitaxel in platinum-sensitive ROC. ? ?We discussed the role of chemotherapy. The intent is of palliative intent. ? ?We discussed some of the risks, benefits, side-effects of carboplatin and liposomal doxorubicin ? ?Some of the short term side-effects included, though not limited to, including weight loss, life threatening infections, risk of allergic reactions, need for transfusions of blood products, nausea, vomiting, change in bowel habits, loss of hair, risk of congestive heart failure, admission to hospital for various reasons, and risks of death.  ? ?Long term side-effects are also discussed including risks of infertility, permanent damage to nerve function, hearing loss, chronic fatigue, kidney damage with possibility needing hemodialysis, and rare secondary malignancy including bone marrow disorders. ? ?The patient is aware that the response rates discussed earlier is not guaranteed.  After a long discussion, patient made an informed decision to proceed with the prescribed plan of care.  ?We discussed the use of cardiac imaging before and during treatment and close monitoring for signs of heart failure while on treatment ?I recommend 3 cycles of treatment before repeat CT imaging ?If she have excellent response to treatment, we can conceivably add bevacizumab down the road ? ?We also discussed potential enrollment in clinical research for bio specimen collection prior to starting treatment ?I will see her within the week after treatment for toxicity ?

## 2021-09-13 NOTE — Telephone Encounter (Signed)
Called and given Echo appt at [facility] at 11 am, arrive at 1045 to entrance C on [address]. She verbalized understanding. ?

## 2021-09-13 NOTE — Telephone Encounter (Signed)
Called and scheduled appt at 2:45 pm today. She is aware of appt. 

## 2021-09-13 NOTE — Progress Notes (Signed)
[facility] ?OFFICE PROGRESS NOTE ? ?Patient Care Team: ?Burnard Bunting, MD as PCP - General (Internal Medicine) ?Awanda Mink Craige Cotta, RN as Oncology Nurse Navigator (Oncology) ?[name], RN as Registered Nurse ? 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 ?We reviewed the current guidelines ?The recommendation is based on publication below: ? ?[facility] (2012) 107, [phone] & 2012 [employer] [place] All rights reserved 0007 - 0920/12 ? ?Final overall survival results of phase III [employer] CALYPSO trial of pegylated liposomal doxorubicin and carboplatin vs paclitaxel and carboplatin in platinum-sensitive ovarian cancer patients ? ?BACKGROUND: The CALYPSO phase III trial compared CD (carboplatin-pegylated liposomal doxorubicin (PLD)) with CP (carboplatinpaclitaxel) in patients with platinum-sensitive recurrent ovarian cancer (ROC). Overall survival (OS) data are now mature. ?METHODS: Women with ROC relapsing 46 months after first- or second-line therapy were randomised to CD or CP for six cycles in this international, open-label, non-inferiority trial. The primary endpoint was progression-free survival. The OS analysis is presented here. ?RESULTS: A total of 976 patients were randomised (467 to CD and 509 to CP). With a median follow-up of 49 months, no statistically significant difference was observed between arms in OS (hazard ratio?0.99 (95%  confidence interval 0.85, 1.16); log-rank P?0.94). Median survival times were 30.7 months (CD) and 33.0 months (CP). No statistically significant difference in OS was observed between arms in predetermined subgroups according to age, body mass index, treatment-free interval, measurable disease, number of lines of prior chemotherapy, or performance status. Post-study cross-over was imbalanced between arms, with a greater proportion of patients randomised to CP receiving post-study PLD (68%) than patients randomised to CD receiving post-study paclitaxel (43%; Po0.001). ?CONCLUSION: Carboplatin-PLD led to delayed progression and similar OS compared with carboplatin-paclitaxel in platinum-sensitive ROC. ? ?We discussed the role of chemotherapy. The intent is of palliative intent. ? ?We discussed some of the risks, benefits, side-effects of carboplatin and liposomal doxorubicin ? ?Some of the short term side-effects included, though not limited to, including weight loss, life threatening infections, risk of allergic reactions, need for transfusions of blood products, nausea, vomiting, change in bowel habits, loss of hair, risk of congestive heart failure, admission to hospital for various reasons, and risks of death.  ? ?Long term side-effects are also discussed including risks of infertility, permanent damage to nerve function, hearing loss, chronic fatigue, kidney damage with possibility needing hemodialysis, and rare secondary malignancy including bone marrow disorders. ? ?The patient is aware that the response rates discussed earlier is not guaranteed.  After a long discussion, patient made an informed decision to proceed with the prescribed plan of care.  ?We discussed the use of cardiac imaging before and during treatment and close monitoring for signs of heart failure while on treatment ?I recommend 3 cycles of treatment before repeat CT imaging ?If she have excellent response to treatment, we can conceivably add  bevacizumab down the road ? ?We also discussed potential enrollment in clinical research for bio specimen collection prior to starting treatment ?I will see her within the week after  treatment for toxicity ? ?Goals of care, counseling/discussion ?We discussed goals of care ?Explained to the patient and her husband why surgery is not indicated ? ?Orders Placed This Encounter  ?Procedures  ? CBC with Differential ([facility] Only)  ?  Standing Status:   Standing  ?  Number of Occurrences:   20  ?  Standing Expiration Date:   09/14/2022  ? CMP ([place] only)  ?  Standing Status:   Standing  ?  Number of Occurrences:   20  ?  Standing Expiration Date:   09/14/2022  ? ECHOCARDIOGRAM COMPLETE  ?  Standing Status:   Future  ?  Standing Expiration Date:   09/14/2022  ?  Order Specific Question:   Where should this test be performed  ?  Answer:   [facility]  ?  Order Specific Question:   Perflutren DEFINITY (image enhancing agent) should be administered unless hypersensitivity or allergy exist  ?  Answer:   Administer Perflutren  ?  Order Specific Question:   Reason for exam-Echo  ?  Answer:   Chemo  Z09  ? ? ?All questions were answered. The patient knows to call the clinic with any problems, questions or concerns. ?The total time spent in the appointment was 40 minutes encounter with patients including review of chart and various tests results, discussions about plan of care and coordination of care plan ?  ?[name] Lark, MD ?09/13/2021 5:50 PM ? ?INTERVAL HISTORY: ?Please see below for problem oriented charting. ?she returns for treatment follow-up and review of test results ?She is on niraparib maintenance ?She denies abdominal pain or changes in bowel habits ?Her husband is present throughout the visit to discuss and review plan of care ? ?REVIEW OF SYSTEMS:   ?Constitutional: Denies fevers, chills or abnormal weight loss ?Eyes: Denies blurriness of vision ?Ears, nose, mouth, throat, and face: Denies mucositis or  sore throat ?Respiratory: Denies cough, dyspnea or wheezes ?Cardiovascular: Denies palpitation, chest discomfort or lower extremity swelling ?Gastrointestinal:  Denies nausea, heartburn or change in bowel habits ?Skin: Denies abnormal skin rashes ?Lymphatics: Denies new lymphadenopathy or easy bruising ?Neurological:Denies numbness, tingling or new weaknesses ?Behavioral/Psych: Mood is stable, no new changes  ?All other systems were reviewed with the patient and are negative. ? ?I have reviewed the past medical history, past surgical history, social history and family history with the patient and they are unchanged from previous note. ? ?ALLERGIES:  is allergic to skin adhesives [cyanoacrylate], shrimp [shellfish allergy], and sulfonamide derivatives. ? ?MEDICATIONS:  ?Current Outpatient Medications  ?Medication Sig Dispense Refill  ? ALPRAZolam (XANAX) 0.25 MG tablet Take 1 tablet (0.25 mg total) by mouth at bedtime as needed for anxiety. 30 tablet 0  ? carboxymethylcellulose (REFRESH PLUS) 0.5 % SOLN Place 1 drop into both eyes 2 (two) times daily as needed (dry eyes).    ? Cholecalciferol (VITAMIN D3) 25 MCG (1000 UT) CAPS Take 1,000 Units by mouth daily.     ? esomeprazole (NEXIUM) 20 MG capsule Take 20 mg by mouth daily.     ? estradiol (ESTRACE VAGINAL) 0.1 MG/GM vaginal cream Place 1 Applicatorful vaginally 3 (three) times a week. 42.5 g 12  ? loratadine (CLARITIN) 10 MG tablet Take 10 mg by mouth daily as needed for allergies.    ? Multiple Vitamin (MULTIVITAMIN WITH MINERALS) TABS tablet Take 1 tablet by mouth daily.    ? ondansetron (ZOFRAN) 8 MG tablet Take 1 tablet by mouth 2 times daily as needed. Start  on the third day after chemotherapy. 30 tablet 1  ? prochlorperazine (COMPAZINE) 10 MG tablet TAKE 1 TABLET BY MOUTH EVERY 6 HOURS AS NEEDED FOR NAUSEA OR VOMITING 30 tablet 2  ? prochlorperazine (COMPAZINE) 10 MG tablet Take 1 tablet by mouth every 6 hours as needed (Nausea or vomiting). 30 tablet 1   ? ?No current facility-administered medications for this visit.  ? ? ?SUMMARY OF ONCOLOGIC HISTORY: ?Oncology History Overview Note  ?Her 2 negative, MSI stable, serous ?Negative genetics ?Progressed on nirapari

## 2021-09-13 NOTE — Research (Signed)
[employer] 2021-05 - Specimen Collection Study to Evaluate Biomarkers in Subjects with Cancer  ? ?Patient Stacy Rivas was identified by MD Alvy Bimler as a potential candidate for the above listed study.  This Clinical Research Nurse met with [name], [id number], on 09/13/21 in a manner and location that ensures patient privacy to discuss participation in the above listed research study.  Patient is Accompanied by spouse [name].  A copy of the informed consent document with embedded HIPAA language was provided to the patient.  Patient reads, speaks, and understands [place].   ?Patient was provided with the business card of this Nurse and encouraged to contact the research team with any questions.  Approximately 20 minutes were spent with the patient reviewing the informed consent documents.  Patient was provided the option of taking informed consent documents home to review and was encouraged to review at their convenience with their support network, including other care providers. Patient took the consent documents home to review.  Will contact pt in the next day or two to determine interest.  Pt aware to expect call. ? ?Wells Guiles 'Learta Codding' Jeanet Lupe, RN, BSN ?Clinical Research Nurse I ?09/13/21 ?3:39 PM ? ? ?

## 2021-09-13 NOTE — Progress Notes (Signed)
DISCONTINUE OFF PATHWAY REGIMEN - Ovarian ? ? ?[id number]:Bevacizumab 15 mg/kg q21d: ?  A cycle is every 21 days: ?    Bevacizumab-xxxx  ? ?**Always confirm dose/schedule in your pharmacy ordering system** ? ?REASON: Disease Progression ?PRIOR TREATMENT: Off Pathway: Bevacizumab 15 mg/kg q21d ?TREATMENT RESPONSE: Stable Disease (SD) ? ?START ON PATHWAY REGIMEN - Ovarian ? ? ?  A cycle is every 28 days: ?    Carboplatin  ?    Liposomal doxorubicin  ? ?**Always confirm dose/schedule in your pharmacy ordering system** ? ?Patient Characteristics: ?Recurrent or Progressive Disease, Third Line, Platinum Sensitive and ? 6 Months Since Last Platinum Therapy ?BRCA Mutation Status: Absent ?Therapeutic Status: Recurrent or Progressive Disease ?Line of Therapy: Third Line ? ?Intent of Therapy: ?Non-Curative / Palliative Intent, Discussed with Patient ?

## 2021-09-14 ENCOUNTER — Other Ambulatory Visit (HOSPITAL_COMMUNITY): Payer: Self-pay

## 2021-09-14 ENCOUNTER — Telehealth: Payer: Self-pay

## 2021-09-14 LAB — CA 125: Cancer Antigen (CA) 125: 28.7 U/mL (ref 0.0–38.1)

## 2021-09-14 NOTE — Telephone Encounter (Signed)
Called and given below message. She verbalized understanding. 

## 2021-09-14 NOTE — Telephone Encounter (Signed)
-----   Message from [name] Lark, MD sent at 09/14/2021  9:19 AM EDT ----- ?Tell her CA-125 is elevated from her baseline ? ?

## 2021-09-15 ENCOUNTER — Ambulatory Visit (HOSPITAL_COMMUNITY)
Admission: RE | Admit: 2021-09-15 | Discharge: 2021-09-15 | Disposition: A | Discharge status: Home / Self Care | Payer: BC Managed Care – PPO | Source: Ambulatory Visit | Attending: Hematology and Oncology | Admitting: Hematology and Oncology

## 2021-09-15 DIAGNOSIS — C562 Malignant neoplasm of left ovary: Secondary | ICD-10-CM | POA: Diagnosis not present

## 2021-09-15 DIAGNOSIS — E785 Hyperlipidemia, unspecified: Secondary | ICD-10-CM | POA: Diagnosis not present

## 2021-09-15 DIAGNOSIS — Z0189 Encounter for other specified special examinations: Secondary | ICD-10-CM

## 2021-09-15 DIAGNOSIS — I5030 Unspecified diastolic (congestive) heart failure: Secondary | ICD-10-CM

## 2021-09-15 LAB — ECHOCARDIOGRAM COMPLETE
Area-P 1/2: 3.6 cm2
Calc EF: 55.9 %
P 1/2 time: 428 msec
S' Lateral: 2.7 cm
Single Plane A2C EF: 56 %
Single Plane A4C EF: 55.9 %

## 2021-09-15 NOTE — Progress Notes (Signed)
?  Echocardiogram ?2D Echocardiogram has been performed. ? ?Fidel Levy ?09/15/2021, 11:55 AM ?

## 2021-09-16 ENCOUNTER — Ambulatory Visit: Payer: BC Managed Care – PPO | Admitting: Hematology and Oncology

## 2021-09-16 DIAGNOSIS — Z85828 Personal history of other malignant neoplasm of skin: Secondary | ICD-10-CM | POA: Diagnosis not present

## 2021-09-16 DIAGNOSIS — L918 Other hypertrophic disorders of the skin: Secondary | ICD-10-CM | POA: Diagnosis not present

## 2021-09-16 DIAGNOSIS — D485 Neoplasm of uncertain behavior of skin: Secondary | ICD-10-CM | POA: Diagnosis not present

## 2021-09-16 DIAGNOSIS — L814 Other melanin hyperpigmentation: Secondary | ICD-10-CM | POA: Diagnosis not present

## 2021-09-16 DIAGNOSIS — L820 Inflamed seborrheic keratosis: Secondary | ICD-10-CM | POA: Diagnosis not present

## 2021-09-16 DIAGNOSIS — L821 Other seborrheic keratosis: Secondary | ICD-10-CM | POA: Diagnosis not present

## 2021-09-16 DIAGNOSIS — D1801 Hemangioma of skin and subcutaneous tissue: Secondary | ICD-10-CM | POA: Diagnosis not present

## 2021-09-17 ENCOUNTER — Telehealth: Payer: Self-pay | Admitting: Emergency Medicine

## 2021-09-17 NOTE — Telephone Encounter (Signed)
[employer] 2021-05 - Specimen Collection Study to Evaluate Biomarkers in Subjects with Cancer  ? ?Contacted patient to let her know she is not eligible for trial as her previous dx's treatment has not had enough time to filter out of her system (would alter results of blood draw).  Pt verbalized understanding and denies any further questions/concerns at this time. ? ?Wells Guiles 'Learta Codding' Kendahl Bumgardner, RN, BSN ?Clinical Research Nurse I ?09/17/21 ?10:02 AM ? ?

## 2021-09-17 NOTE — Progress Notes (Signed)
Pharmacist Chemotherapy Monitoring - Initial Assessment   ? ?Anticipated start date: 09/20/21  ? ?The following has been reviewed per standard work regarding the patient's treatment regimen: ?The patient's diagnosis, treatment plan and drug doses, and organ/hematologic function ?Lab orders and baseline tests specific to treatment regimen  ?The treatment plan start date, drug sequencing, and pre-medications ?Prior authorization status  ?Patient's documented medication list, including drug-drug interaction screen and prescriptions for anti-emetics and supportive care specific to the treatment regimen ?The drug concentrations, fluid compatibility, administration routes, and timing of the medications to be used ?The patient's access for treatment and lifetime cumulative dose history, if applicable  ?The patient's medication allergies and previous infusion related reactions, if applicable  ? ?Changes made to treatment plan:  ?N/A ? ?Follow up needed:  ?N/A ? ? ?Kennith Center, Pharm.D., CPP ?09/17/2021'@4'$ :41 PM ? ? ? ?

## 2021-09-20 ENCOUNTER — Inpatient Hospital Stay: Payer: BC Managed Care – PPO

## 2021-09-20 ENCOUNTER — Other Ambulatory Visit: Payer: Self-pay

## 2021-09-20 ENCOUNTER — Other Ambulatory Visit: Payer: Self-pay | Admitting: Hematology and Oncology

## 2021-09-20 ENCOUNTER — Inpatient Hospital Stay: Payer: BC Managed Care – PPO | Attending: Gynecologic Oncology

## 2021-09-20 VITALS — BP 161/88 | HR 75 | Temp 98.8°F | Resp 18 | Wt 151.2 lb

## 2021-09-20 DIAGNOSIS — C785 Secondary malignant neoplasm of large intestine and rectum: Secondary | ICD-10-CM | POA: Diagnosis not present

## 2021-09-20 DIAGNOSIS — Z5111 Encounter for antineoplastic chemotherapy: Secondary | ICD-10-CM | POA: Diagnosis not present

## 2021-09-20 DIAGNOSIS — C786 Secondary malignant neoplasm of retroperitoneum and peritoneum: Secondary | ICD-10-CM

## 2021-09-20 DIAGNOSIS — Z7189 Other specified counseling: Secondary | ICD-10-CM

## 2021-09-20 DIAGNOSIS — C563 Malignant neoplasm of bilateral ovaries: Secondary | ICD-10-CM | POA: Insufficient documentation

## 2021-09-20 DIAGNOSIS — C562 Malignant neoplasm of left ovary: Secondary | ICD-10-CM

## 2021-09-20 LAB — CBC WITH DIFFERENTIAL (CANCER CENTER ONLY)
Abs Immature Granulocytes: 0.01 10*3/uL (ref 0.00–0.07)
Basophils Absolute: 0 10*3/uL (ref 0.0–0.1)
Basophils Relative: 0 %
Eosinophils Absolute: 0.1 10*3/uL (ref 0.0–0.5)
Eosinophils Relative: 1 %
HCT: 37.9 % (ref 36.0–46.0)
Hemoglobin: 13.2 g/dL (ref 12.0–15.0)
Immature Granulocytes: 0 %
Lymphocytes Relative: 22 %
Lymphs Abs: 1.1 10*3/uL (ref 0.7–4.0)
MCH: 32.8 pg (ref 26.0–34.0)
MCHC: 34.8 g/dL (ref 30.0–36.0)
MCV: 94 fL (ref 80.0–100.0)
Monocytes Absolute: 0.4 10*3/uL (ref 0.1–1.0)
Monocytes Relative: 8 %
Neutro Abs: 3.5 10*3/uL (ref 1.7–7.7)
Neutrophils Relative %: 69 %
Platelet Count: 232 10*3/uL (ref 150–400)
RBC: 4.03 MIL/uL (ref 3.87–5.11)
RDW: 12.3 % (ref 11.5–15.5)
WBC Count: 5 10*3/uL (ref 4.0–10.5)
nRBC: 0 % (ref 0.0–0.2)

## 2021-09-20 LAB — CMP (CANCER CENTER ONLY)
ALT: 21 U/L (ref 0–44)
AST: 27 U/L (ref 15–41)
Albumin: 4.2 g/dL (ref 3.5–5.0)
Alkaline Phosphatase: 74 U/L (ref 38–126)
Anion gap: 6 (ref 5–15)
BUN: 11 mg/dL (ref 8–23)
CO2: 27 mmol/L (ref 22–32)
Calcium: 9.1 mg/dL (ref 8.9–10.3)
Chloride: 106 mmol/L (ref 98–111)
Creatinine: 0.51 mg/dL (ref 0.44–1.00)
GFR, Estimated: >60 mL/min (ref >60)
Glucose, Bld: 107 mg/dL — ABNORMAL HIGH (ref 70–99)
Potassium: 3.4 mmol/L — ABNORMAL LOW (ref 3.5–5.1)
Sodium: 139 mmol/L (ref 135–145)
Total Bilirubin: 1.7 mg/dL — ABNORMAL HIGH (ref 0.3–1.2)
Total Protein: 6.4 g/dL — ABNORMAL LOW (ref 6.5–8.1)

## 2021-09-20 MED ORDER — FAMOTIDINE IN NACL 20-0.9 MG/50ML-% IV SOLN
20.0000 mg | Freq: Once | INTRAVENOUS | Status: AC
  Administered 2021-09-20: 20 mg via INTRAVENOUS
  Filled 2021-09-20: qty 50

## 2021-09-20 MED ORDER — SODIUM CHLORIDE 0.9% FLUSH
10.0000 mL | Freq: Once | INTRAVENOUS | Status: AC
  Administered 2021-09-20: 10 mL

## 2021-09-20 MED ORDER — SODIUM CHLORIDE 0.9% FLUSH
10.0000 mL | INTRAVENOUS | Status: DC | PRN
  Administered 2021-09-20: 10 mL

## 2021-09-20 MED ORDER — HEPARIN SOD (PORK) LOCK FLUSH 100 UNIT/ML IV SOLN
500.0000 [IU] | Freq: Once | INTRAVENOUS | Status: AC | PRN
  Administered 2021-09-20: 500 [IU]

## 2021-09-20 MED ORDER — DIPHENHYDRAMINE HCL 25 MG PO CAPS
25.0000 mg | ORAL_CAPSULE | Freq: Once | ORAL | Status: AC
  Administered 2021-09-20: 25 mg via ORAL
  Filled 2021-09-20: qty 1

## 2021-09-20 MED ORDER — SODIUM CHLORIDE 0.9 % IV SOLN
424.0000 mg | Freq: Once | INTRAVENOUS | Status: AC
  Administered 2021-09-20: 420 mg via INTRAVENOUS
  Filled 2021-09-20: qty 42

## 2021-09-20 MED ORDER — SODIUM CHLORIDE 0.9 % IV SOLN
10.0000 mg | Freq: Once | INTRAVENOUS | Status: AC
  Administered 2021-09-20: 10 mg via INTRAVENOUS
  Filled 2021-09-20: qty 10

## 2021-09-20 MED ORDER — SODIUM CHLORIDE 0.9 % IV SOLN
150.0000 mg | Freq: Once | INTRAVENOUS | Status: AC
  Administered 2021-09-20: 150 mg via INTRAVENOUS
  Filled 2021-09-20: qty 150

## 2021-09-20 MED ORDER — PALONOSETRON HCL INJECTION 0.25 MG/5ML
0.2500 mg | Freq: Once | INTRAVENOUS | Status: AC
  Administered 2021-09-20: 0.25 mg via INTRAVENOUS
  Filled 2021-09-20: qty 5

## 2021-09-20 MED ORDER — DOXORUBICIN HCL LIPOSOMAL CHEMO INJECTION 2 MG/ML
29.0000 mg/m2 | Freq: Once | INTRAVENOUS | Status: AC
  Administered 2021-09-20: 50 mg via INTRAVENOUS
  Filled 2021-09-20: qty 25

## 2021-09-20 MED ORDER — DEXTROSE 5 % IV SOLN: Freq: Once | INTRAVENOUS | Status: AC

## 2021-09-20 NOTE — Patient Instructions (Addendum)
[facility]  Discharge Instructions: ?Thank you for choosing [facility] to provide your oncology and hematology care.  ? ?If you have a lab appointment with the [facility], please go directly to the [facility] and check in at the registration area. ?  ?Wear comfortable clothing and clothing appropriate for easy access to any Portacath or PICC line.  ? ?We strive to give you quality time with your provider. You may need to reschedule your appointment if you arrive late (15 or more minutes).  Arriving late affects you and other patients whose appointments are after yours.  Also, if you miss three or more appointments without notifying the office, you may be dismissed from the clinic at the provider?s discretion.    ?  ?For prescription refill requests, have your pharmacy contact our office and allow 72 hours for refills to be completed.   ? ?Today you received the following chemotherapy and/or immunotherapy agents doxorubin, carboplatin ?BELOW ARE SYMPTOMS THAT SHOULD BE REPORTED IMMEDIATELY: ?*FEVER GREATER THAN 100.4 F (38 ?C) OR HIGHER ?*CHILLS OR SWEATING ?*NAUSEA AND VOMITING THAT IS NOT CONTROLLED WITH YOUR NAUSEA MEDICATION ?*UNUSUAL SHORTNESS OF BREATH ?*UNUSUAL BRUISING OR BLEEDING ?*URINARY PROBLEMS (pain or burning when urinating, or frequent urination) ?*BOWEL PROBLEMS (unusual diarrhea, constipation, pain near the anus) ?TENDERNESS IN MOUTH AND THROAT WITH OR WITHOUT PRESENCE OF ULCERS (sore throat, sores in mouth, or a toothache) ?UNUSUAL RASH, SWELLING OR PAIN  ?UNUSUAL VAGINAL DISCHARGE OR ITCHING  ? ?Items with * indicate a potential emergency and should be followed up as soon as possible or go to the Emergency Department if any problems should occur. ? ?Please show the CHEMOTHERAPY ALERT CARD or IMMUNOTHERAPY ALERT CARD at check-in to the Emergency Department and triage nurse. ? ?Should you have questions after your visit or need to cancel or  reschedule your appointment, please contact [facility]  Dept: [phone]  and follow the prompts.  Office hours are 8:00 a.m. to 4:30 p.m. Monday - Friday. Please note that voicemails left after 4:00 p.m. may not be returned until the following business day.  We are closed weekends and major holidays. You have access to a nurse at all times for urgent questions. Please call the main number to the clinic Dept: [phone] and follow the prompts. ? ? ?For any non-urgent questions, you may also contact your provider using MyChart. We now offer e-Visits for anyone 68 and older to request care online for non-urgent symptoms. For details visit [email or web address]. ?  ?Also download the MyChart app! Go to the app store, search "MyChart", open the app, select [facility], and log in with your MyChart username and password. ? ?Due to Covid, a mask is required upon entering the hospital/clinic. If you do not have a mask, one will be given to you upon arrival. For doctor visits, patients may have 1 support person aged 90 or older with them. For treatment visits, patients cannot have anyone with them due to current Covid guidelines and our immunocompromised population.  ? ?Doxorubicin Liposomal injection ?What is this medication? ?LIPOSOMAL DOXORUBICIN (LIP oh som al  dox oh ROO bi sin) is a chemotherapy drug. This medicine is used to treat many kinds of cancer like Kaposi's sarcoma, multiple myeloma, and ovarian cancer. ?This medicine may be used for other purposes; ask your health care provider or pharmacist if you have questions. ?COMMON BRAND NAME(S): Doxil, Lipodox ?What should I tell my care team before  I take this medication? ?They need to know if you have any of these conditions: ?blood disorders ?heart disease ?infection (especially a virus infection such as chickenpox, cold sores, or herpes) ?liver disease ?recent or ongoing radiation therapy ?an unusual or allergic reaction to  doxorubicin, other chemotherapy agents, soybeans, other medicines, foods, dyes, or preservatives ?pregnant or trying to get pregnant ?breast-feeding ?How should I use this medication? ?This drug is given as an infusion into a vein. It is administered in a hospital or clinic by a specially trained health care professional. If you have pain, swelling, burning or any unusual feeling around the site of your injection, tell your health care professional right away. ?Talk to your pediatrician regarding the use of this medicine in children. Special care may be needed. ?Overdosage: If you think you have taken too much of this medicine contact a poison control center or emergency room at once. ?NOTE: This medicine is only for you. Do not share this medicine with others. ?What if I miss a dose? ?It is important not to miss your dose. Call your doctor or health care professional if you are unable to keep an appointment. ?What may interact with this medication? ?Do not take this medicine with any of the following medications: ?zidovudine ?This medicine may also interact with the following medications: ?medicines to increase blood counts like filgrastim, pegfilgrastim, sargramostim ?vaccines ?Talk to your doctor or health care professional before taking any of these medicines: ?acetaminophen ?aspirin ?ibuprofen ?ketoprofen ?naproxen ?This list may not describe all possible interactions. Give your health care provider a list of all the medicines, herbs, non-prescription drugs, or dietary supplements you use. Also tell them if you smoke, drink alcohol, or use illegal drugs. Some items may interact with your medicine. ?What should I watch for while using this medication? ?Your condition will be monitored carefully while you are receiving this medicine. You may need blood work done while you are taking this medicine. ?This drug may make you feel generally unwell. This is not uncommon, as chemotherapy can affect healthy cells as well  as cancer cells. Report any side effects. Continue your course of treatment even though you feel ill unless your doctor tells you to stop. ?Your urine may turn orange-red for a few days after your dose. This is not blood. If your urine is dark or brown, call your doctor. ?In some cases, you may be given additional medicines to help with side effects. Follow all directions for their use. ?Talk to your doctor about your risk of cancer. You may be more at risk for certain types of cancers if you take this medicine. ?Do not become pregnant while taking this medicine or for 6 months after stopping it. Women should inform their healthcare professional if they wish to become pregnant or think they may be pregnant. Men should not father a child while taking this medicine and for 6 months after stopping it. There is a potential for serious side effects to an unborn child. Talk to your health care professional or pharmacist for more information. Do not breast-feed an infant while taking this medicine. ?This medicine has caused ovarian failure in some women. This medicine may make it more difficult to get pregnant. Talk to your healthcare professional if you are concerned about your fertility. ?This medicine has caused decreased sperm counts in some men. This may make it more difficult to father a child. Talk to your healthcare professional if you are concerned about your fertility. ?This medicine may cause a  decrease in Co-Enzyme Q-10. You should make sure that you get enough Co-Enzyme Q-10 while you are taking this medicine. Discuss the foods you eat and the vitamins you take with your health care professional. ?What side effects may I notice from receiving this medication? ?Side effects that you should report to your doctor or health care professional as soon as possible: ?allergic reactions like skin rash, itching or hives, swelling of the face, lips, or tongue ?low blood counts - this medicine may decrease the number of  white blood cells, red blood cells and platelets. You may be at increased risk for infections and bleeding. ?signs of hand-foot syndrome - tingling or burning, redness, flaking, swelling, small blisters, or s

## 2021-09-21 LAB — CA 125: Cancer Antigen (CA) 125: 27.7 U/mL (ref 0.0–38.1)

## 2021-09-28 ENCOUNTER — Other Ambulatory Visit (HOSPITAL_COMMUNITY): Payer: Self-pay

## 2021-09-28 ENCOUNTER — Encounter: Payer: Self-pay | Admitting: Hematology and Oncology

## 2021-09-28 ENCOUNTER — Other Ambulatory Visit: Payer: Self-pay

## 2021-09-28 ENCOUNTER — Inpatient Hospital Stay (HOSPITAL_BASED_OUTPATIENT_CLINIC_OR_DEPARTMENT_OTHER): Payer: BC Managed Care – PPO | Admitting: Hematology and Oncology

## 2021-09-28 DIAGNOSIS — F411 Generalized anxiety disorder: Secondary | ICD-10-CM | POA: Diagnosis not present

## 2021-09-28 DIAGNOSIS — R109 Unspecified abdominal pain: Secondary | ICD-10-CM

## 2021-09-28 DIAGNOSIS — C785 Secondary malignant neoplasm of large intestine and rectum: Secondary | ICD-10-CM | POA: Diagnosis not present

## 2021-09-28 DIAGNOSIS — C562 Malignant neoplasm of left ovary: Secondary | ICD-10-CM | POA: Diagnosis not present

## 2021-09-28 DIAGNOSIS — Z5111 Encounter for antineoplastic chemotherapy: Secondary | ICD-10-CM | POA: Diagnosis not present

## 2021-09-28 DIAGNOSIS — C563 Malignant neoplasm of bilateral ovaries: Secondary | ICD-10-CM | POA: Diagnosis not present

## 2021-09-28 MED ORDER — ALPRAZOLAM 0.25 MG PO TABS
0.2500 mg | ORAL_TABLET | Freq: Every evening | ORAL | 0 refills | Status: DC | PRN
  Filled 2021-09-28: qty 30, 30d supply, fill #0

## 2021-09-28 NOTE — Progress Notes (Signed)
[place] ?OFFICE PROGRESS NOTE ? ?Patient Care Team: ?Burnard Bunting, MD as PCP - General (Internal Medicine) ?Awanda Mink Craige Cotta, RN as Oncology Nurse Navigator (Oncology) ?[name], RN as Registered Nurse ? 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 ?Anxiety state ?She appears somewhat anxious ?I reassured her ?I refilled her prescription Xanax to take as needed ? ?Abdominal pain ?She has mild intermittent abdominal pain related to disease process ?Her exam is benign ?She is reassured ? ?No orders of the defined types were placed in this encounter. ? ? ?All questions were answered. The patient knows to call the clinic with any problems, questions or concerns. ?The total time spent in the appointment was 20 minutes encounter with patients including review of chart and various tests results, discussions about plan of care and coordination of care plan ?  ?[name] Lark, MD ?09/28/2021 2:46 PM ? ?INTERVAL HISTORY: ?Please see below for problem oriented charting. ?she returns for treatment follow-up ?She tolerated last cycle of treatment well except for mild nausea without vomiting ?She has mild intermittent loose stool ?Her abdominal pain is less ?She continues to experience some anxiety throughout the process of treatment ?No mucositis or hand-foot syndrome ? ?REVIEW OF SYSTEMS:   ?Constitutional: Denies fevers, chills or abnormal weight loss ?Eyes: Denies blurriness of vision ?Ears, nose, mouth, throat, and face: Denies mucositis or sore throat ?Respiratory: Denies cough, dyspnea or wheezes ?Cardiovascular: Denies palpitation, chest discomfort or lower extremity swelling ?Skin: Denies abnormal skin rashes ?Lymphatics: Denies new lymphadenopathy or easy bruising ?Neurological:Denies numbness, tingling or new weaknesses ?Behavioral/Psych: Mood is  stable, no new changes  ?All other systems were reviewed with the patient and are negative. ? ?I have reviewed the past medical history, past surgical history, social history and family history with the patient and they are unchanged from previous note. ? ?ALLERGIES:  is allergic to skin adhesives [cyanoacrylate], shrimp [shellfish allergy], and sulfonamide derivatives. ? ?MEDICATIONS:  ?Current Outpatient Medications  ?Medication Sig Dispense Refill  ? ALPRAZolam (XANAX) 0.25 MG tablet Take 1 tablet by mouth at bedtime as needed for anxiety. 30 tablet 0  ? carboxymethylcellulose (REFRESH PLUS) 0.5 % SOLN Place 1 drop into both eyes 2 (two) times daily as needed (dry eyes).    ? Cholecalciferol (VITAMIN D3) 25 MCG (1000 UT) CAPS Take 1,000 Units by mouth daily.     ? esomeprazole (NEXIUM) 20 MG capsule Take 20 mg by mouth daily.     ? estradiol (ESTRACE VAGINAL) 0.1 MG/GM vaginal cream Place 1 Applicatorful vaginally 3 (three) times a week. 42.5 g 12  ? loratadine (CLARITIN) 10 MG tablet Take 10 mg by mouth daily as needed for allergies.    ? Multiple Vitamin (MULTIVITAMIN WITH MINERALS) TABS tablet Take 1 tablet by mouth daily.    ? ondansetron (ZOFRAN) 8 MG tablet Take 1 tablet by mouth 2 times daily as needed. Start on the third day after chemotherapy. 30 tablet 1  ? prochlorperazine (COMPAZINE) 10 MG tablet TAKE 1 TABLET BY MOUTH EVERY 6 HOURS AS NEEDED FOR NAUSEA OR VOMITING 30 tablet 2  ? prochlorperazine (COMPAZINE) 10 MG tablet Take 1 tablet by mouth every 6 hours as needed (Nausea or vomiting). 30 tablet 1  ? ?No current facility-administered medications for this visit.  ? ? ?SUMMARY OF ONCOLOGIC HISTORY: ?Oncology History Overview Note  ?  Her 2 negative, MSI stable, serous ?Negative genetics ?Progressed on niraparib ?  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 1. 14.7 cm poorly defined soft tissue mass in central pelvis suspicious for primary ovarian carcinoma, with uterine leiomyosarcoma  considered a less likely differential diagnosis. ?2. Moderate ascites and diffuse peritoneal carcinomatosis. ?3. Several small uterine fibroids. ?  ?11/13/2018 Tumor Marker  ? Patient's tumor was tested for the following markers: CA-125 ?Results of the tumor marker test revealed 4536 ?  ?11/21/2018 Procedure  ? Successful ultrasound-guided diagnostic and therapeutic paracentesis yielding 3.1 liters of peritoneal fluid ?  ?11/21/2018 Pathology Results  ? PERITONEAL/ASCITIC FLUID(SPECIMEN 1 OF 1 COLLECTED 11/21/18): ?ADENOCARCINOMA. ?Specimen Clinical Information ?Pelvic mass suspicious for ovarian cancer ?Source ?Peritoneal/Ascitic Fluid, (specimen 1 of 1 collected 11/21/18) ?Gross ?Specimen: Received is/are 1000 ccs of dark amber fluid. (CM:cm) ?Prepared: ?# Smears: 0 ?# Concentration Technique Slides (i.e. ThinPrep): 1 ?# Cell Block: 1 ?Additional Studies: n/a ?Comment ?Comment: The cytologic features are most consistent with serous carcinoma. ?  ?11/29/2018 Initial Diagnosis  ? Ovarian cancer [facility]) ?  ?11/29/2018 Cancer Staging  ? Staging form: Ovary, Fallopian Tube, and Primary Peritoneal Carcinoma, AJCC 8th Edition ?- Clinical: cT3, cN0, cM0 - Signed by [name] Lark, MD on 11/29/2018 ? ?  ?12/03/2018 Procedure  ? Successful ultrasound-guided therapeutic paracentesis yielding 3.7 liters of peritoneal fluid. ?  ?  ?12/06/2018 Procedure  ? Placement of a subcutaneous port device. Catheter tip at the SVC and right atrium junction ?  ?12/14/2018 Procedure  ? Successful ultrasound-guided paracentesis yielding 2.9 L of peritoneal fluid ?  ?12/28/2018 Tumor Marker  ? Patient's tumor was tested for the following markers: CA-125 ?Results of the tumor marker test revealed 812. ?  ?12/28/2018 - 04/22/2019 Chemotherapy  ? The patient had carboplatin and taxol for neoadjuvant treatment, followed by interval debulking surgery and subsequent adjuvant chemotherapy treatment.  ? ?  ?02/01/2019 Imaging  ? Ct abdomen and pelvis ?8.6 cm left ovarian mass,  corresponding to the patient's known primary neoplasm, improved. ?  ?Mild peritoneal nodularity/omental caking, improved. ?  ?Small abdominopelvic ascites, improved. ?  ?  ?02/01/2019 Tumor Marker  ? Patient's tumor was tested for the following markers: CA-125 ?Results of the tumor marker test revealed 183. ?  ?02/12/2019 Surgery  ? Preoperative Diagnosis: Stage IIIC ovarian cancer, s/p neoadjuvant chemotherapy  ?  ?  ?Procedure(s) Performed: 1. Exploratory laparotomy with total abdominal hysterectomy, bilateral salpingo-oophorectomy, omentectomy radical tumor debulking for ovarian cancer. ?  ?Surgeon: Thereasa Solo, MD. ?   ?Operative Findings: upper abdomen free of disease. No visible omental disease. Small volume (200cc) ascites. 8cm friable mass replacing left ovary and adherent to the sigmoid colon mesentery and ureter on the left.  Anterior fibroid. This represented an optimal cytoreduction (R0) with no gross visible disease remaining.  ?  ?02/12/2019 Pathology Results  ? A. OVARY AND FALLOPIAN TUBE, LEFT, SALPINGO-OOPHORECTOMY: ?- Serous carcinoma, high grade, status post neoadjuvant therapy ?- See oncology table and comment below ?B. UTERUS CERVIX WITH RIGHT FALLOPIAN TUBE AND OVARY, HYSTERECTOMY: ?Uterus: ?- Serosal surface involved by serous carcinoma ?- Endomyometrium uninvolved by carcinoma ?- Benign endometrial polyp (4.1 cm) ?- Leiomyomata (5.5 cm; largest) ?- Adenomyosis ?Cervix: ?- Uninvolved by carcinoma ?Left ovary: ?- Serous carcinoma, high grade ?Left fallopian tube: ?- Serous carcinoma, high grade ?C. SOFT TISSUE, LEFT PELVIC SIDEWALL TUMOR, EXCISION: ?- Metastatic serous carcinoma, high-grade ?D. SOFT TISSUE, SIGMOID COLON MESENTERY, EXCISION: ?- Metastatic serous carcinoma, high-grade ?E. OMENTUM, TUMOR  RESECTION: ?- Metastatic serous carcinoma, high-grade ?OVARY or FALLOPIAN TUBE or PRIMARY PERITONEUM: ?Procedure: Total hysterectomy and bilateral salpingo-oophorectomy, omentectomy and peritoneal  biopsies ?Specimen Integrity: Fragmented ?Tumor Site: Left ovary and fallopian tube ?Ovarian Surface Involvement: Present ?Fallopian Tube Surface Involvement: Present ?Tumor Size: 6.3 cm (see comment) ?Hi

## 2021-09-28 NOTE — Assessment & Plan Note (Signed)
She has mild intermittent abdominal pain related to disease process ?Her exam is benign ?She is reassured ?

## 2021-09-28 NOTE — Assessment & Plan Note (Signed)
Overall, she tolerated treatment well ?Her exam is benign ?I will see her again for further follow-up prior to cycle 2 of treatment ?

## 2021-09-28 NOTE — Assessment & Plan Note (Signed)
She appears somewhat anxious ?I reassured her ?I refilled her prescription Xanax to take as needed ?

## 2021-10-04 DIAGNOSIS — Z780 Asymptomatic menopausal state: Secondary | ICD-10-CM | POA: Diagnosis not present

## 2021-10-06 ENCOUNTER — Encounter: Payer: Self-pay | Admitting: Hematology and Oncology

## 2021-10-06 ENCOUNTER — Ambulatory Visit
Admission: RE | Admit: 2021-10-06 | Discharge: 2021-10-06 | Disposition: A | Discharge status: Home / Self Care | Payer: No Typology Code available for payment source | Source: Ambulatory Visit | Attending: Internal Medicine | Admitting: Internal Medicine

## 2021-10-06 DIAGNOSIS — E785 Hyperlipidemia, unspecified: Secondary | ICD-10-CM

## 2021-10-14 ENCOUNTER — Telehealth: Payer: Self-pay

## 2021-10-14 ENCOUNTER — Other Ambulatory Visit (HOSPITAL_COMMUNITY): Payer: Self-pay

## 2021-10-14 MED ORDER — DEXAMETHASONE 0.5 MG/5ML PO SOLN
ORAL | 0 refills | Status: DC
  Filled 2021-10-14: qty 120, 6d supply, fill #0

## 2021-10-14 NOTE — Telephone Encounter (Signed)
Spoke with patient regarding management and interventions for persistent ulcer noted under her tongue. Patient has been utilizing salt water rinses with minimal relief.  Dr. Alvy Bimler made aware and orders called in to [place] for dexamethasone solution that patient can utilize for about a week. Patient advised that she can swish and spit solution up to four times a day, per pharmacy recommendation.  Patient also provided with recommendations to utilize baking soda rinses and to refrain from irritating foods such as spicy and acidic foods for the time being.  Patient verbalized an understanding and appreciative of call.

## 2021-10-20 MED FILL — Fosaprepitant Dimeglumine For IV Infusion 150 MG (Base Eq): INTRAVENOUS | Qty: 5 | Status: AC

## 2021-10-20 MED FILL — Dexamethasone Sodium Phosphate Inj 100 MG/10ML: INTRAMUSCULAR | Qty: 1 | Status: AC

## 2021-10-21 ENCOUNTER — Inpatient Hospital Stay (HOSPITAL_BASED_OUTPATIENT_CLINIC_OR_DEPARTMENT_OTHER): Payer: BC Managed Care – PPO | Admitting: Hematology and Oncology

## 2021-10-21 ENCOUNTER — Encounter: Payer: Self-pay | Admitting: Hematology and Oncology

## 2021-10-21 ENCOUNTER — Other Ambulatory Visit: Payer: Self-pay

## 2021-10-21 ENCOUNTER — Inpatient Hospital Stay: Payer: BC Managed Care – PPO | Attending: Gynecologic Oncology

## 2021-10-21 ENCOUNTER — Inpatient Hospital Stay: Payer: BC Managed Care – PPO

## 2021-10-21 DIAGNOSIS — T451X5A Adverse effect of antineoplastic and immunosuppressive drugs, initial encounter: Secondary | ICD-10-CM | POA: Diagnosis not present

## 2021-10-21 DIAGNOSIS — D701 Agranulocytosis secondary to cancer chemotherapy: Secondary | ICD-10-CM | POA: Diagnosis not present

## 2021-10-21 DIAGNOSIS — Z7189 Other specified counseling: Secondary | ICD-10-CM

## 2021-10-21 DIAGNOSIS — C562 Malignant neoplasm of left ovary: Secondary | ICD-10-CM | POA: Diagnosis not present

## 2021-10-21 DIAGNOSIS — Z5111 Encounter for antineoplastic chemotherapy: Secondary | ICD-10-CM | POA: Insufficient documentation

## 2021-10-21 DIAGNOSIS — K1231 Oral mucositis (ulcerative) due to antineoplastic therapy: Secondary | ICD-10-CM

## 2021-10-21 DIAGNOSIS — C786 Secondary malignant neoplasm of retroperitoneum and peritoneum: Secondary | ICD-10-CM

## 2021-10-21 LAB — CMP (CANCER CENTER ONLY)
ALT: 22 U/L (ref 0–44)
AST: 24 U/L (ref 15–41)
Albumin: 4.1 g/dL (ref 3.5–5.0)
Alkaline Phosphatase: 82 U/L (ref 38–126)
Anion gap: 5 (ref 5–15)
BUN: 9 mg/dL (ref 8–23)
CO2: 28 mmol/L (ref 22–32)
Calcium: 9.6 mg/dL (ref 8.9–10.3)
Chloride: 108 mmol/L (ref 98–111)
Creatinine: 0.49 mg/dL (ref 0.44–1.00)
GFR, Estimated: >60 mL/min (ref >60)
Glucose, Bld: 112 mg/dL — ABNORMAL HIGH (ref 70–99)
Potassium: 3.8 mmol/L (ref 3.5–5.1)
Sodium: 141 mmol/L (ref 135–145)
Total Bilirubin: 1.1 mg/dL (ref 0.3–1.2)
Total Protein: 6.4 g/dL — ABNORMAL LOW (ref 6.5–8.1)

## 2021-10-21 LAB — CBC WITH DIFFERENTIAL (CANCER CENTER ONLY)
Abs Immature Granulocytes: 0 10*3/uL (ref 0.00–0.07)
Basophils Absolute: 0 10*3/uL (ref 0.0–0.1)
Basophils Relative: 0 %
Eosinophils Absolute: 0 10*3/uL (ref 0.0–0.5)
Eosinophils Relative: 1 %
HCT: 35.7 % — ABNORMAL LOW (ref 36.0–46.0)
Hemoglobin: 12.7 g/dL (ref 12.0–15.0)
Immature Granulocytes: 0 %
Lymphocytes Relative: 32 %
Lymphs Abs: 1.1 10*3/uL (ref 0.7–4.0)
MCH: 33.9 pg (ref 26.0–34.0)
MCHC: 35.6 g/dL (ref 30.0–36.0)
MCV: 95.2 fL (ref 80.0–100.0)
Monocytes Absolute: 0.3 10*3/uL (ref 0.1–1.0)
Monocytes Relative: 10 %
Neutro Abs: 1.9 10*3/uL (ref 1.7–7.7)
Neutrophils Relative %: 57 %
Platelet Count: 291 10*3/uL (ref 150–400)
RBC: 3.75 MIL/uL — ABNORMAL LOW (ref 3.87–5.11)
RDW: 13.5 % (ref 11.5–15.5)
WBC Count: 3.4 10*3/uL — ABNORMAL LOW (ref 4.0–10.5)
nRBC: 0 % (ref 0.0–0.2)

## 2021-10-21 MED ORDER — DOXORUBICIN HCL LIPOSOMAL CHEMO INJECTION 2 MG/ML
29.0000 mg/m2 | Freq: Once | INTRAVENOUS | Status: AC
  Administered 2021-10-21: 50 mg via INTRAVENOUS
  Filled 2021-10-21: qty 25

## 2021-10-21 MED ORDER — SODIUM CHLORIDE 0.9 % IV SOLN
10.0000 mg | Freq: Once | INTRAVENOUS | Status: AC
  Administered 2021-10-21: 10 mg via INTRAVENOUS
  Filled 2021-10-21: qty 10

## 2021-10-21 MED ORDER — SODIUM CHLORIDE 0.9 % IV SOLN
413.5000 mg | Freq: Once | INTRAVENOUS | Status: DC
  Filled 2021-10-21 (×3): qty 41

## 2021-10-21 MED ORDER — HEPARIN SOD (PORK) LOCK FLUSH 100 UNIT/ML IV SOLN
500.0000 [IU] | Freq: Once | INTRAVENOUS | Status: AC | PRN
  Administered 2021-10-21: 500 [IU]

## 2021-10-21 MED ORDER — PALONOSETRON HCL INJECTION 0.25 MG/5ML
0.2500 mg | Freq: Once | INTRAVENOUS | Status: AC
  Administered 2021-10-21: 0.25 mg via INTRAVENOUS
  Filled 2021-10-21: qty 5

## 2021-10-21 MED ORDER — FAMOTIDINE IN NACL 20-0.9 MG/50ML-% IV SOLN
20.0000 mg | Freq: Once | INTRAVENOUS | Status: AC
  Administered 2021-10-21: 20 mg via INTRAVENOUS
  Filled 2021-10-21: qty 50

## 2021-10-21 MED ORDER — SODIUM CHLORIDE 0.9% FLUSH
10.0000 mL | INTRAVENOUS | Status: DC | PRN
  Administered 2021-10-21: 10 mL

## 2021-10-21 MED ORDER — DEXTROSE 5 % IV SOLN: Freq: Once | INTRAVENOUS | Status: AC

## 2021-10-21 MED ORDER — DIPHENHYDRAMINE HCL 25 MG PO CAPS
25.0000 mg | ORAL_CAPSULE | Freq: Once | ORAL | Status: AC
  Administered 2021-10-21: 25 mg via ORAL
  Filled 2021-10-21: qty 1

## 2021-10-21 MED ORDER — SODIUM CHLORIDE 0.9 % IV SOLN
413.5000 mg | Freq: Once | INTRAVENOUS | Status: AC
  Administered 2021-10-21: 410 mg via INTRAVENOUS
  Filled 2021-10-21: qty 41

## 2021-10-21 MED ORDER — SODIUM CHLORIDE 0.9 % IV SOLN
150.0000 mg | Freq: Once | INTRAVENOUS | Status: AC
  Administered 2021-10-21: 150 mg via INTRAVENOUS
  Filled 2021-10-21: qty 150

## 2021-10-21 MED ORDER — SODIUM CHLORIDE 0.9% FLUSH
10.0000 mL | Freq: Once | INTRAVENOUS | Status: AC
  Administered 2021-10-21: 10 mL

## 2021-10-21 NOTE — Progress Notes (Signed)
[facility] OFFICE PROGRESS NOTE  Patient Care Team: Aronson, Richard, MD as PCP - General (Internal Medicine) Hess, Karen R, RN as Oncology Nurse Navigator (Oncology) Peterson, Jena B, RN as Registered Nurse  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HCC) Her recent treatment was complicated by some mucositis and mild leukopenia She is better today and is ready to resume treatment I recommend minimum 3 cycles of therapy before we repeat CT imaging I am optimistic that she is responding to treatment already due to resolution of her abdominal symptoms  Mucositis due to antineoplastic therapy This is likely related to treatment We discussed conservative approach with Magic mouthwash solution and salt water with baking soda mixed gargle  Leukopenia due to antineoplastic chemotherapy (HCC) This is likely due to recent treatment. The patient denies recent history of fevers, cough, chills, diarrhea or dysuria. She is asymptomatic from the leukopenia. I will observe for now.  I will continue the chemotherapy at current dose without dosage adjustment.  If the leukopenia gets progressive worse in the future, I might have to delay her treatment or adjust the chemotherapy dose.    No orders of the defined types were placed in this encounter.   All questions were answered. The patient knows to call the clinic with any problems, questions or concerns. The total time spent in the appointment was 20 minutes encounter with patients including review of chart and various tests results, discussions about plan of care and coordination of care plan   Ni Gorsuch, MD 10/21/2021 10:18 AM  INTERVAL HISTORY: Please see below for problem oriented charting. she returns for treatment follow-up seen prior to cycle 2 of treatment She had mucositis towards the end of the cycle but is almost completely resolved She had some mild intermittent sensation of nausea but not significant She had  daily normal bowel movement now and denies abdominal pain  REVIEW OF SYSTEMS:   Constitutional: Denies fevers, chills or abnormal weight loss Eyes: Denies blurriness of vision Respiratory: Denies cough, dyspnea or wheezes Cardiovascular: Denies palpitation, chest discomfort or lower extremity swelling Skin: Denies abnormal skin rashes Lymphatics: Denies new lymphadenopathy or easy bruising Neurological:Denies numbness, tingling or new weaknesses Behavioral/Psych: Mood is stable, no new changes  All other systems were reviewed with the patient and are negative.  I have reviewed the past medical history, past surgical history, social history and family history with the patient and they are unchanged from previous note.  ALLERGIES:  is allergic to skin adhesives [cyanoacrylate], shrimp [shellfish allergy], and sulfonamide derivatives.  MEDICATIONS:  Current Outpatient Medications  Medication Sig Dispense Refill   ALPRAZolam (XANAX) 0.25 MG tablet Take 1 tablet by mouth at bedtime as needed for anxiety. 30 tablet 0   carboxymethylcellulose (REFRESH PLUS) 0.5 % SOLN Place 1 drop into both eyes 2 (two) times daily as needed (dry eyes).     Cholecalciferol (VITAMIN D3) 25 MCG (1000 UT) CAPS Take 1,000 Units by mouth daily.      dexamethasone (DECADRON) 0.5 MG/5ML solution Swish 5 mls in mouth then spit out --use  4 times a day as needed 120 mL 0   esomeprazole (NEXIUM) 20 MG capsule Take 20 mg by mouth daily.      estradiol (ESTRACE VAGINAL) 0.1 MG/GM vaginal cream Place 1 Applicatorful vaginally 3 (three) times a week. 42.5 g 12   loratadine (CLARITIN) 10 MG tablet Take 10 mg by mouth daily as needed for allergies.     Multiple Vitamin (MULTIVITAMIN   WITH MINERALS) TABS tablet Take 1 tablet by mouth daily.     ondansetron (ZOFRAN) 8 MG tablet Take 1 tablet by mouth 2 times daily as needed. Start on the third day after chemotherapy. 30 tablet 1   prochlorperazine (COMPAZINE) 10 MG tablet TAKE 1  TABLET BY MOUTH EVERY 6 HOURS AS NEEDED FOR NAUSEA OR VOMITING 30 tablet 2   prochlorperazine (COMPAZINE) 10 MG tablet Take 1 tablet by mouth every 6 hours as needed (Nausea or vomiting). 30 tablet 1   No current facility-administered medications for this visit.    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 14.7 cm poorly defined soft tissue mass in central pelvis suspicious for primary ovarian carcinoma, with uterine leiomyosarcoma considered a less likely differential diagnosis. 2. Moderate ascites and diffuse peritoneal carcinomatosis. 3. Several small uterine fibroids.   11/13/2018 Tumor Marker   Patient's tumor was tested for the following markers: CA-125 Results of the tumor marker test revealed 1434   11/21/2018 Procedure   Successful ultrasound-guided diagnostic and therapeutic paracentesis yielding 3.1 liters of peritoneal fluid   11/21/2018 Pathology Results   PERITONEAL/ASCITIC FLUID(SPECIMEN 1 OF 1 COLLECTED 11/21/18): ADENOCARCINOMA. Specimen Clinical Information Pelvic mass suspicious for ovarian cancer Source Peritoneal/Ascitic Fluid, (specimen 1 of 1 collected 11/21/18) Gross Specimen: Received is/are 1000 ccs of dark amber fluid. (CM:cm) Prepared: # Smears: 0 # Concentration Technique Slides (i.e. 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   11/29/2018 Initial Diagnosis   Ovarian cancer ([place])   11/29/2018 Cancer Staging   Staging form: Ovary, Fallopian Tube, and Primary Peritoneal Carcinoma, AJCC 8th Edition - Clinical: cT3, cN0, cM0 - Signed by [name] Lark, MD on 11/29/2018    12/03/2018 Procedure   Successful ultrasound-guided therapeutic paracentesis yielding 3.7 liters of peritoneal fluid.     12/06/2018  Procedure   Placement of a subcutaneous port device. Catheter tip at the SVC and right atrium junction   12/14/2018 Procedure   Successful ultrasound-guided paracentesis yielding 2.9 L of peritoneal fluid   12/28/2018 Tumor Marker   Patient's tumor was tested for the following markers: CA-125 Results of the tumor marker test revealed 812.   12/28/2018 - 04/22/2019 Chemotherapy   The patient had carboplatin and taxol for neoadjuvant treatment, followed by interval debulking surgery and subsequent adjuvant chemotherapy treatment.     02/01/2019 Imaging   Ct abdomen and pelvis 8.6 cm left ovarian mass, corresponding to the patient's known primary neoplasm, improved.   Mild peritoneal nodularity/omental caking, improved.   Small abdominopelvic ascites, improved.     02/01/2019 Tumor Marker   Patient's tumor was tested for the following markers: CA-125 Results of the tumor marker test revealed 183.   02/12/2019 Surgery   Preoperative Diagnosis: Stage IIIC ovarian cancer, s/p neoadjuvant chemotherapy      Procedure(s) Performed: 1. Exploratory laparotomy with total abdominal hysterectomy, bilateral salpingo-oophorectomy, omentectomy radical tumor debulking for ovarian cancer.   Surgeon: Thereasa Solo, MD.    Operative Findings: upper abdomen free of disease. No visible omental disease. Small volume (200cc) ascites. 8cm friable mass replacing left ovary and adherent to the sigmoid colon mesentery and ureter on the left.  Anterior fibroid. This represented an optimal cytoreduction (R0) with no gross visible disease remaining.    02/12/2019 Pathology Results  A. OVARY AND FALLOPIAN TUBE, LEFT, SALPINGO-OOPHORECTOMY: - Serous carcinoma, high grade, status post neoadjuvant therapy - See oncology table and comment below B. UTERUS CERVIX WITH RIGHT FALLOPIAN TUBE AND OVARY, HYSTERECTOMY: Uterus: - Serosal surface involved by serous carcinoma - Endomyometrium uninvolved by carcinoma - Benign  endometrial polyp (4.1 cm) - Leiomyomata (5.5 cm; largest) - Adenomyosis Cervix: - Uninvolved by carcinoma Left ovary: - Serous carcinoma, high grade Left fallopian tube: - Serous carcinoma, high grade C. SOFT TISSUE, LEFT PELVIC SIDEWALL TUMOR, EXCISION: - Metastatic serous carcinoma, high-grade D. SOFT TISSUE, SIGMOID COLON MESENTERY, EXCISION: - Metastatic serous carcinoma, high-grade E. OMENTUM, TUMOR RESECTION: - Metastatic serous carcinoma, high-grade OVARY or FALLOPIAN TUBE or PRIMARY PERITONEUM: Procedure: Total hysterectomy and bilateral salpingo-oophorectomy, omentectomy and peritoneal biopsies Specimen Integrity: Fragmented Tumor Site: Left ovary and fallopian tube Ovarian Surface Involvement: Present Fallopian Tube Surface Involvement: Present Tumor Size: 6.3 cm (see comment) Histologic Type: Serous carcinoma Histologic Grade: High-grade Implants: Not applicable Other Tissue/ Organ Involvement: Right ovary, right fallopian tube, omentum, mesentery Largest Extrapelvic Peritoneal Focus: Macroscopic Peritoneal/Ascitic Fluid: Malignant Treatment Effect: No definite or minimal response identified (chemotherapy response score 1 [CRS1] Regional Lymph Nodes: No lymph nodes submitted or found Pathologic Stage Classification (pTNM, AJCC 8th Edition): pT3c, pNX Representative Tumor Block: A4 Comment(s): Additional testing (HER-2, MMR and MSI) are pending. The primary tumor site appears to be the left ovary and fallopian tube. The uterus is only involved on the serosal surface. There is tumor on the anterior peritoneal reflection.  Addendum: Tumor is Her2 negative,MSI stable   02/19/2019 Tumor Marker   Patient's tumor was tested for the following markers: CA-125 Results of the tumor marker test revealed 40.3   04/01/2019 Tumor Marker   Patient's tumor was tested for the following markers: CA-125 Results of the tumor marker test revealed 8.1    Genetic Testing   Negative  testing. No pathogenic variants identified on the [employer]. The report date is 04/17/2019.  Somatic genes analyzed through [facility]: ATM, BARD1, BRCA1, BRCA2, BRIP1, CHEK2, MRE11A, NBN, PALB2, RAD51C, RAD51D.  The CancerNext gene panel offered by [employer] includes sequencing and rearrangement analysis for the following 36 genes: APC*, ATM*, AXIN2, BARD1, BMPR1A, BRCA1*, BRCA2*, BRIP1*, CDH1*, CDK4, CDKN2A, CHEK2*, DICER1, MLH1*, MSH2*, MSH3, MSH6*, MUTYH*, NBN, NF1*, NTHL1, PALB2*, PMS2*, PTEN*, RAD51C*, RAD51D*, RECQL, SMAD4, SMARCA4, STK11 and TP53* (sequencing and deletion/duplication); HOXB13, POLD1 and POLE (sequencing only); EPCAM and GREM1 (deletion/duplication only).    05/21/2019 Imaging   1. Interval hysterectomy, oophorectomy and omentectomy. No evidence of residual ovarian carcinoma in the pelvis. No evidence of peritoneal disease. No intraperitoneal free fluid. 2. Nonobstructing LEFT renal calculi.  Normal ureters.   05/22/2019 Tumor Marker   Patient's tumor was tested for the following markers: CA-125 Results of the tumor marker test revealed 5.7.   06/03/2019 Procedure   Successful right IJ vein Port-A-Cath explant.   08/26/2019 Tumor Marker   Patient's tumor was tested for the following markers: CA-125. Results of the tumor marker test revealed 4.9   10/03/2019 Imaging   1. New tumor deposits along the capsular surfaces of the liver, spleen, in the left pelvis, and right paracolic gutter, compatible with metastatic disease. 2. Mild dilation of the dorsal pancreatic duct in the pancreatic body and head, cause uncertain. 3. Nonobstructive left nephrolithiasis. 4. Aortic atherosclerosis.   Aortic Atherosclerosis (ICD10-I70.0   10/10/2019 Procedure   Successful placement of a right IJ approach Power Port with ultrasound and fluoroscopic guidance. The catheter is  ready for use.     10/11/2019 Tumor Marker   Patient's tumor was tested for  the following markers: CA-125 Results of the tumor marker test revealed 19.6.   10/14/2019 -  Chemotherapy   The patient had carboplatin and gemzar for chemotherapy treatment.     11/04/2019 Tumor Marker   Patient's tumor was tested for the following markers: CA-125 Results of the tumor marker test revealed 11.4   11/28/2019 Tumor Marker   Patient's tumor was tested for the following markers: CA-125 Results of the tumor marker test revealed 5.8   12/20/2019 Imaging   1. Significant interval reduction in mixed solid and cystic nodule in the vicinity of the left ovary, with significant improvement or resolution of multiple pelvic, peritoneal, and organ capsule implants. Resolution of previously seen small volume ascites. Findings are consistent with treatment response of abdominal metastatic disease. 2. Status post hysterectomy, oophorectomy, and omentectomy. 3. Nonobstructive left nephrolithiasis. 4. Aortic Atherosclerosis (ICD10-I70.0).   12/23/2019 Tumor Marker   Patient's tumor was tested for the following markers: CA-125 Results of the tumor marker test revealed 5.0   01/24/2020 Tumor Marker   Patient's tumor was tested for the following markers: CA-125 Results of the tumor marker test revealed 4.8   02/24/2020 Tumor Marker   Patient's tumor was tested for the following markers: CA-125. Results of the tumor marker test revealed 4.3.   03/06/2020 Imaging   1. Status post hysterectomy, bilateral oophorectomy, and omentectomy. 2. Resolution of previously described left adnexal nodularity. 3. No residual disease identified. 4.  Aortic Atherosclerosis (ICD10-I70.0). 5. Left nephrolithiasis.   04/20/2020 Tumor Marker   Patient's tumor was tested for the following markers: CA-125 Results of the tumor marker test revealed 4.6   05/18/2020 Tumor Marker   Patient's tumor was tested for the following markers: CA-125 Results of the tumor marker test revealed 4.7   05/29/2020 Imaging    Status post hysterectomy and bilateral salpingo oophorectomy.   No evidence of recurrent or metastatic disease.   06/08/2020 - 09/13/2021 Chemotherapy   She was on niraparib       06/17/2020 Tumor Marker   Patient's tumor was tested for the following markers: CA-125 Results of the tumor marker test revealed 4.5   08/04/2020 Tumor Marker   Patient's tumor was tested for the following markers: CA-125. Results of the tumor marker test revealed 5.7   09/07/2020 Imaging   1. Status post hysterectomy and bilateral salpingo oophorectomy. No evidence of recurrent or metastatic disease. 2. Nonobstructing left nephrolithiasis. 3. Left-sided colonic diverticulosis without findings of acute diverticulitis. 4. Aortic atherosclerosis.     09/07/2020 Tumor Marker   Patient's tumor was tested for the following markers: CA-125 Results of the tumor marker test revealed 4.8.   10/21/2020 Tumor Marker   Patient's tumor was tested for the following markers: CA-125. Results of the tumor marker test revealed 5.1.   12/14/2020 Tumor Marker   Patient's tumor was tested for the following markers: CA-125. Results of the tumor marker test revealed 5.5.   04/05/2021 Tumor Marker   Patient's tumor was tested for the following markers: CA-125. Results of the tumor marker test revealed 5.9.   05/31/2021 Tumor Marker   Patient's tumor was tested for the following markers: CA-125. Results of the tumor marker test revealed 8.1.   09/13/2021 Imaging   1. Interval development of multiple new cystic and solid lesions in the abdomen and pelvis measuring up to 10.3 x 7.2 cm and consistent with peritoneal metastatic disease.   2. Nonobstructing left renal stones. 3. Aortic Atherosclerosis (ICD10-I70.0).     09/14/2021 Tumor Marker   Patient's tumor was tested for the following markers: CA-125. Results of the tumor marker test revealed 28.5.   09/16/2021 Echocardiogram   1. Left ventricular ejection fraction, by  estimation, is 60 to 65%. The left ventricle has normal function. The left ventricle has no regional wall motion abnormalities. Left ventricular diastolic parameters are consistent with Grade I diastolic dysfunction (impaired relaxation).  2. Right ventricular systolic function is normal. The right ventricular size is normal.  3. The mitral valve is normal in structure. Trivial mitral valve regurgitation. No evidence of mitral stenosis.  4. The aortic valve has an indeterminant number of cusps. Aortic valve regurgitation is trivial. No aortic stenosis is present.  5. The inferior vena cava is normal in size with greater than 50% respiratory variability, suggesting right atrial pressure of 3 mmHg.     09/20/2021 -  Chemotherapy   Patient is on Treatment Plan : OVARIAN RECURRENT Liposomal Doxorubicin + Carboplatin q28d X 6 Cycles      09/23/2021 Tumor Marker   Patient's tumor was tested for the following markers: CA-125. Results of the tumor marker test revealed 27.7.     PHYSICAL EXAMINATION: ECOG PERFORMANCE STATUS: 1 - Symptomatic but completely ambulatory  Vitals:   10/21/21 0928  BP: (!) 150/84  Pulse: 78  Resp: 18  Temp: (!) 97.5 F (36.4 C)  SpO2: 100%   Filed Weights   10/21/21 0928  Weight: 147 lb (66.7 kg)    GENERAL:alert, no distress and comfortable SKIN: skin color, texture, turgor are normal, no rashes or significant lesions EYES: normal, Conjunctiva are pink and non-injected, sclera clear OROPHARYNX:no exudate, no erythema and lips, buccal mucosa, and tongue normal  NECK: supple, thyroid normal size, non-tender, without nodularity LYMPH:  no palpable lymphadenopathy in the cervical, axillary or inguinal LUNGS: clear to auscultation and percussion with normal breathing effort HEART: regular rate & rhythm and no murmurs and no lower extremity edema ABDOMEN:abdomen soft, non-tender and normal bowel sounds Musculoskeletal:no cyanosis of digits and no clubbing  NEURO:  alert & oriented x 3 with fluent speech, no focal motor/sensory deficits  LABORATORY DATA:  I have reviewed the data as listed    Component Value Date/Time   NA 141 10/21/2021 0906   K 3.8 10/21/2021 0906   CL 108 10/21/2021 0906   CO2 28 10/21/2021 0906   GLUCOSE 112 (H) 10/21/2021 0906   BUN 9 10/21/2021 0906   CREATININE 0.49 10/21/2021 0906   CALCIUM 9.6 10/21/2021 0906   PROT 6.4 (L) 10/21/2021 0906   ALBUMIN 4.1 10/21/2021 0906   AST 24 10/21/2021 0906   ALT 22 10/21/2021 0906   ALKPHOS 82 10/21/2021 0906   BILITOT 1.1 10/21/2021 0906   GFRNONAA >60 10/21/2021 0906   GFRAA >60 02/24/2020 0852    No results found for: SPEP, UPEP  Lab Results  Component Value Date   WBC 3.4 (L) 10/21/2021   NEUTROABS 1.9 10/21/2021   HGB 12.7 10/21/2021   HCT 35.7 (L) 10/21/2021   MCV 95.2 10/21/2021   PLT 291 10/21/2021      Chemistry      Component Value Date/Time   NA 141 10/21/2021 0906   K 3.8 10/21/2021 0906   CL 108 10/21/2021 0906   CO2 28 10/21/2021 0906   BUN 9 10/21/2021 0906   CREATININE 0.49 10/21/2021 0906      Component Value Date/Time  CALCIUM 9.6 10/21/2021 0906   ALKPHOS 82 10/21/2021 0906   AST 24 10/21/2021 0906   ALT 22 10/21/2021 0906   BILITOT 1.1 10/21/2021 0906      

## 2021-10-21 NOTE — Assessment & Plan Note (Signed)
Her recent treatment was complicated by some mucositis and mild leukopenia She is better today and is ready to resume treatment I recommend minimum 3 cycles of therapy before we repeat CT imaging I am optimistic that she is responding to treatment already due to resolution of her abdominal symptoms

## 2021-10-21 NOTE — Patient Instructions (Signed)
[facility]  Discharge Instructions: Thank you for choosing [facility] to provide your oncology and hematology care.   If you have a lab appointment with the [facility], please go directly to the [facility] and check in at the registration area.   Wear comfortable clothing and clothing appropriate for easy access to any Portacath or PICC line.   We strive to give you quality time with your provider. You may need to reschedule your appointment if you arrive late (15 or more minutes).  Arriving late affects you and other patients whose appointments are after yours.  Also, if you miss three or more appointments without notifying the office, you may be dismissed from the clinic at the provider's discretion.      For prescription refill requests, have your pharmacy contact our office and allow 72 hours for refills to be completed.    Today you received the following chemotherapy and/or immunotherapy agents: Doxil, Carboplatin      To help prevent nausea and vomiting after your treatment, we encourage you to take your nausea medication as directed.  BELOW ARE SYMPTOMS THAT SHOULD BE REPORTED IMMEDIATELY: *FEVER GREATER THAN 100.4 F (38 C) OR HIGHER *CHILLS OR SWEATING *NAUSEA AND VOMITING THAT IS NOT CONTROLLED WITH YOUR NAUSEA MEDICATION *UNUSUAL SHORTNESS OF BREATH *UNUSUAL BRUISING OR BLEEDING *URINARY PROBLEMS (pain or burning when urinating, or frequent urination) *BOWEL PROBLEMS (unusual diarrhea, constipation, pain near the anus) TENDERNESS IN MOUTH AND THROAT WITH OR WITHOUT PRESENCE OF ULCERS (sore throat, sores in mouth, or a toothache) UNUSUAL RASH, SWELLING OR PAIN  UNUSUAL VAGINAL DISCHARGE OR ITCHING   Items with * indicate a potential emergency and should be followed up as soon as possible or go to the Emergency Department if any problems should occur.  Please show the CHEMOTHERAPY ALERT CARD or IMMUNOTHERAPY ALERT CARD at  check-in to the Emergency Department and triage nurse.  Should you have questions after your visit or need to cancel or reschedule your appointment, please contact [facility]  Dept: [phone]  and follow the prompts.  Office hours are 8:00 a.m. to 4:30 p.m. Monday - Friday. Please note that voicemails left after 4:00 p.m. may not be returned until the following business day.  We are closed weekends and major holidays. You have access to a nurse at all times for urgent questions. Please call the main number to the clinic Dept: [phone] and follow the prompts.   For any non-urgent questions, you may also contact your provider using MyChart. We now offer e-Visits for anyone 47 and older to request care online for non-urgent symptoms. For details visit [email or web address].   Also download the MyChart app! Go to the app store, search "MyChart", open the app, select [facility], and log in with your MyChart username and password.  Due to Covid, a mask is required upon entering the hospital/clinic. If you do not have a mask, one will be given to you upon arrival. For doctor visits, patients may have 1 support person aged 37 or older with them. For treatment visits, patients cannot have anyone with them due to current Covid guidelines and our immunocompromised population.

## 2021-10-21 NOTE — Assessment & Plan Note (Signed)
This is likely due to recent treatment. The patient denies recent history of fevers, cough, chills, diarrhea or dysuria. She is asymptomatic from the leukopenia. I will observe for now.  I will continue the chemotherapy at current dose without dosage adjustment.  If the leukopenia gets progressive worse in the future, I might have to delay her treatment or adjust the chemotherapy dose.   

## 2021-10-21 NOTE — Assessment & Plan Note (Signed)
This is likely related to treatment We discussed conservative approach with Magic mouthwash solution and salt water with baking soda mixed gargle

## 2021-10-22 LAB — CA 125: Cancer Antigen (CA) 125: 31.4 U/mL (ref 0.0–38.1)

## 2021-11-19 MED FILL — Fosaprepitant Dimeglumine For IV Infusion 150 MG (Base Eq): INTRAVENOUS | Qty: 5 | Status: AC

## 2021-11-19 MED FILL — Dexamethasone Sodium Phosphate Inj 100 MG/10ML: INTRAMUSCULAR | Qty: 1 | Status: AC

## 2021-11-22 ENCOUNTER — Encounter: Payer: Self-pay | Admitting: Hematology and Oncology

## 2021-11-22 ENCOUNTER — Other Ambulatory Visit: Payer: Self-pay

## 2021-11-22 ENCOUNTER — Inpatient Hospital Stay: Payer: BC Managed Care – PPO

## 2021-11-22 ENCOUNTER — Inpatient Hospital Stay (HOSPITAL_BASED_OUTPATIENT_CLINIC_OR_DEPARTMENT_OTHER): Payer: BC Managed Care – PPO | Admitting: Hematology and Oncology

## 2021-11-22 ENCOUNTER — Inpatient Hospital Stay: Payer: BC Managed Care – PPO | Attending: Gynecologic Oncology

## 2021-11-22 VITALS — BP 150/73 | HR 77 | Temp 98.2°F | Resp 18 | Ht 62.0 in | Wt 150.2 lb

## 2021-11-22 DIAGNOSIS — D6481 Anemia due to antineoplastic chemotherapy: Secondary | ICD-10-CM

## 2021-11-22 DIAGNOSIS — C562 Malignant neoplasm of left ovary: Secondary | ICD-10-CM

## 2021-11-22 DIAGNOSIS — C563 Malignant neoplasm of bilateral ovaries: Secondary | ICD-10-CM | POA: Diagnosis not present

## 2021-11-22 DIAGNOSIS — Z5111 Encounter for antineoplastic chemotherapy: Secondary | ICD-10-CM | POA: Insufficient documentation

## 2021-11-22 DIAGNOSIS — Z79899 Other long term (current) drug therapy: Secondary | ICD-10-CM | POA: Insufficient documentation

## 2021-11-22 DIAGNOSIS — C786 Secondary malignant neoplasm of retroperitoneum and peritoneum: Secondary | ICD-10-CM | POA: Insufficient documentation

## 2021-11-22 DIAGNOSIS — T451X5A Adverse effect of antineoplastic and immunosuppressive drugs, initial encounter: Secondary | ICD-10-CM

## 2021-11-22 DIAGNOSIS — Z7189 Other specified counseling: Secondary | ICD-10-CM

## 2021-11-22 DIAGNOSIS — Z5112 Encounter for antineoplastic immunotherapy: Secondary | ICD-10-CM | POA: Insufficient documentation

## 2021-11-22 LAB — CBC WITH DIFFERENTIAL (CANCER CENTER ONLY)
Abs Immature Granulocytes: 0.02 10*3/uL (ref 0.00–0.07)
Basophils Absolute: 0 10*3/uL (ref 0.0–0.1)
Basophils Relative: 0 %
Eosinophils Absolute: 0 10*3/uL (ref 0.0–0.5)
Eosinophils Relative: 1 %
HCT: 33.8 % — ABNORMAL LOW (ref 36.0–46.0)
Hemoglobin: 11.7 g/dL — ABNORMAL LOW (ref 12.0–15.0)
Immature Granulocytes: 0 %
Lymphocytes Relative: 23 %
Lymphs Abs: 1.1 10*3/uL (ref 0.7–4.0)
MCH: 33.8 pg (ref 26.0–34.0)
MCHC: 34.6 g/dL (ref 30.0–36.0)
MCV: 97.7 fL (ref 80.0–100.0)
Monocytes Absolute: 0.5 10*3/uL (ref 0.1–1.0)
Monocytes Relative: 12 %
Neutro Abs: 3 10*3/uL (ref 1.7–7.7)
Neutrophils Relative %: 64 %
Platelet Count: 260 10*3/uL (ref 150–400)
RBC: 3.46 MIL/uL — ABNORMAL LOW (ref 3.87–5.11)
RDW: 15.4 % (ref 11.5–15.5)
WBC Count: 4.6 10*3/uL (ref 4.0–10.5)
nRBC: 0 % (ref 0.0–0.2)

## 2021-11-22 LAB — CMP (CANCER CENTER ONLY)
ALT: 28 U/L (ref 0–44)
AST: 28 U/L (ref 15–41)
Albumin: 3.9 g/dL (ref 3.5–5.0)
Alkaline Phosphatase: 101 U/L (ref 38–126)
Anion gap: 6 (ref 5–15)
BUN: 9 mg/dL (ref 8–23)
CO2: 27 mmol/L (ref 22–32)
Calcium: 9 mg/dL (ref 8.9–10.3)
Chloride: 107 mmol/L (ref 98–111)
Creatinine: 0.48 mg/dL (ref 0.44–1.00)
GFR, Estimated: >60 mL/min (ref >60)
Glucose, Bld: 101 mg/dL — ABNORMAL HIGH (ref 70–99)
Potassium: 3.7 mmol/L (ref 3.5–5.1)
Sodium: 140 mmol/L (ref 135–145)
Total Bilirubin: 1.1 mg/dL (ref 0.3–1.2)
Total Protein: 6.3 g/dL — ABNORMAL LOW (ref 6.5–8.1)

## 2021-11-22 MED ORDER — DIPHENHYDRAMINE HCL 25 MG PO CAPS
25.0000 mg | ORAL_CAPSULE | Freq: Once | ORAL | Status: AC
  Administered 2021-11-22: 25 mg via ORAL
  Filled 2021-11-22: qty 1

## 2021-11-22 MED ORDER — HEPARIN SOD (PORK) LOCK FLUSH 100 UNIT/ML IV SOLN
500.0000 [IU] | Freq: Once | INTRAVENOUS | Status: AC | PRN
  Administered 2021-11-22: 500 [IU]

## 2021-11-22 MED ORDER — SODIUM CHLORIDE 0.9 % IV SOLN
150.0000 mg | Freq: Once | INTRAVENOUS | Status: AC
  Administered 2021-11-22: 150 mg via INTRAVENOUS
  Filled 2021-11-22: qty 150

## 2021-11-22 MED ORDER — SODIUM CHLORIDE 0.9 % IV SOLN
10.0000 mg | Freq: Once | INTRAVENOUS | Status: AC
  Administered 2021-11-22: 10 mg via INTRAVENOUS
  Filled 2021-11-22: qty 10

## 2021-11-22 MED ORDER — SODIUM CHLORIDE 0.9% FLUSH
10.0000 mL | Freq: Once | INTRAVENOUS | Status: AC
  Administered 2021-11-22: 10 mL

## 2021-11-22 MED ORDER — SODIUM CHLORIDE 0.9 % IV SOLN
413.5000 mg | Freq: Once | INTRAVENOUS | Status: AC
  Administered 2021-11-22: 410 mg via INTRAVENOUS
  Filled 2021-11-22: qty 41

## 2021-11-22 MED ORDER — SODIUM CHLORIDE 0.9% FLUSH
10.0000 mL | INTRAVENOUS | Status: DC | PRN
  Administered 2021-11-22: 10 mL

## 2021-11-22 MED ORDER — FAMOTIDINE IN NACL 20-0.9 MG/50ML-% IV SOLN
20.0000 mg | Freq: Once | INTRAVENOUS | Status: AC
  Administered 2021-11-22: 20 mg via INTRAVENOUS
  Filled 2021-11-22: qty 50

## 2021-11-22 MED ORDER — DOXORUBICIN HCL LIPOSOMAL CHEMO INJECTION 2 MG/ML
29.0000 mg/m2 | Freq: Once | INTRAVENOUS | Status: AC
  Administered 2021-11-22: 50 mg via INTRAVENOUS
  Filled 2021-11-22: qty 25

## 2021-11-22 MED ORDER — PALONOSETRON HCL INJECTION 0.25 MG/5ML
0.2500 mg | Freq: Once | INTRAVENOUS | Status: AC
  Administered 2021-11-22: 0.25 mg via INTRAVENOUS
  Filled 2021-11-22: qty 5

## 2021-11-22 MED ORDER — DEXTROSE 5 % IV SOLN: Freq: Once | INTRAVENOUS | Status: AC

## 2021-11-22 NOTE — Progress Notes (Signed)
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 The patient denies recent history of bleeding such as epistaxis, hematuria or hematochezia. She is asymptomatic from the anemia. I will observe for now.  She does not require transfusion now. I will continue the chemotherapy at current dose without dosage adjustment.  If the anemia gets progressive worse in the future, I might have to delay her treatment or adjust the chemotherapy dose.   Orders Placed This Encounter  Procedures   CT ABDOMEN PELVIS W CONTRAST    Standing Status:   Future    Standing Expiration Date:   11/23/2022    Order Specific Question:   If indicated for the ordered procedure, I authorize the administration of contrast media per Radiology protocol    Answer:   Yes    Order Specific Question:   Preferred imaging location?    Answer:   [facility]    Order Specific Question:   Radiology Contrast Protocol - do NOT remove file path    Answer:   [email or web address]    All questions were answered. The patient knows to call the clinic with any problems, questions or concerns. The total time spent in the appointment was 20 minutes encounter with patients including review of chart and various tests results, discussions about plan of care and coordination of care  plan   [name] Lark, MD 11/22/2021 8:25 AM  INTERVAL HISTORY: Please see below for problem oriented charting. she returns for treatment follow-up She feels well No significant mucositis or nausea Denies abdominal pain or bloating She complains of some mild fatigue  REVIEW OF SYSTEMS:   Constitutional: Denies fevers, chills or abnormal weight loss Eyes: Denies blurriness of vision Ears, nose, mouth, throat, and face: Denies mucositis or sore throat Respiratory: Denies cough, dyspnea or wheezes Cardiovascular: Denies palpitation, chest discomfort or lower extremity swelling Gastrointestinal:  Denies nausea, heartburn or change in bowel habits Skin: Denies abnormal skin rashes Lymphatics: Denies new lymphadenopathy or easy bruising Neurological:Denies numbness, tingling or new weaknesses Behavioral/Psych: Mood is stable, no new changes  All other systems were reviewed with the patient and are negative.  I have reviewed the past medical history, past surgical history, social history and family history with the patient and they are unchanged from previous note.  ALLERGIES:  is allergic to skin adhesives [cyanoacrylate], shrimp [shellfish allergy], and sulfonamide derivatives.  MEDICATIONS:  Current Outpatient Medications  Medication Sig Dispense Refill   ALPRAZolam (XANAX) 0.25 MG tablet Take 1 tablet by mouth at bedtime as needed for anxiety. 30 tablet 0   carboxymethylcellulose (REFRESH PLUS) 0.5 % SOLN Place 1 drop into both eyes 2 (two) times daily as needed (dry eyes).     Cholecalciferol (VITAMIN D3) 25 MCG (1000 UT) CAPS Take 1,000 Units by mouth daily.      dexamethasone (DECADRON) 0.5 MG/5ML solution Swish 5 mls in mouth then spit out --  use  4 times a day as needed 120 mL 0   esomeprazole (NEXIUM) 20 MG capsule Take 20 mg by mouth daily.      estradiol (ESTRACE VAGINAL) 0.1 MG/GM vaginal cream Place 1 Applicatorful vaginally 3 (three) times a week. 42.5 g 12   loratadine  (CLARITIN) 10 MG tablet Take 10 mg by mouth daily as needed for allergies.     Multiple Vitamin (MULTIVITAMIN WITH MINERALS) TABS tablet Take 1 tablet by mouth daily.     ondansetron (ZOFRAN) 8 MG tablet Take 1 tablet by mouth 2 times daily as needed. Start on the third day after chemotherapy. 30 tablet 1   prochlorperazine (COMPAZINE) 10 MG tablet TAKE 1 TABLET BY MOUTH EVERY 6 HOURS AS NEEDED FOR NAUSEA OR VOMITING 30 tablet 2   prochlorperazine (COMPAZINE) 10 MG tablet Take 1 tablet by mouth every 6 hours as needed (Nausea or vomiting). 30 tablet 1   No current facility-administered medications for this visit.    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 14.7 cm poorly defined soft tissue mass in central pelvis suspicious for primary ovarian carcinoma, with uterine leiomyosarcoma considered a less likely differential diagnosis. 2. Moderate ascites and diffuse peritoneal carcinomatosis. 3. Several small uterine fibroids.   11/13/2018 Tumor Marker   Patient's tumor was tested for the following markers: CA-125 Results of the tumor marker test revealed 1434   11/21/2018 Procedure   Successful ultrasound-guided diagnostic and therapeutic paracentesis yielding 3.1 liters of peritoneal fluid   11/21/2018 Pathology Results   PERITONEAL/ASCITIC FLUID(SPECIMEN 1 OF 1 COLLECTED 11/21/18): ADENOCARCINOMA. Specimen Clinical Information Pelvic mass suspicious for ovarian cancer Source Peritoneal/Ascitic Fluid, (specimen 1 of 1 collected 11/21/18) Gross Specimen: Received is/are 1000 ccs of dark amber fluid. (CM:cm) Prepared: # Smears: 0 # Concentration Technique Slides (i.e. 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   11/29/2018 Initial  Diagnosis   Ovarian cancer ([place])   11/29/2018 Cancer Staging   Staging form: Ovary, Fallopian Tube, and Primary Peritoneal Carcinoma, AJCC 8th Edition - Clinical: cT3, cN0, cM0 - Signed by [name] Lark, MD on 11/29/2018   12/03/2018 Procedure   Successful ultrasound-guided therapeutic paracentesis yielding 3.7 liters of peritoneal fluid.     12/06/2018 Procedure   Placement of a subcutaneous port device. Catheter tip at the SVC and right atrium junction   12/14/2018 Procedure   Successful ultrasound-guided paracentesis yielding 2.9 L of peritoneal fluid   12/28/2018 Tumor Marker   Patient's tumor was tested for the following markers: CA-125 Results of the tumor marker test revealed 812.   12/28/2018 - 04/22/2019 Chemotherapy   The patient had carboplatin and taxol for neoadjuvant treatment, followed by interval debulking surgery and subsequent adjuvant chemotherapy treatment.     02/01/2019 Imaging   Ct abdomen and pelvis 8.6 cm left ovarian mass, corresponding to the patient's known primary neoplasm, improved.   Mild peritoneal nodularity/omental caking, improved.   Small abdominopelvic ascites, improved.     02/01/2019 Tumor Marker   Patient's tumor was tested for the following markers: CA-125 Results of the tumor marker test revealed 183.   02/12/2019 Surgery   Preoperative Diagnosis: Stage IIIC ovarian cancer, s/p neoadjuvant chemotherapy      Procedure(s) Performed: 1. Exploratory laparotomy with total abdominal hysterectomy, bilateral salpingo-oophorectomy, omentectomy radical  tumor debulking for ovarian cancer.   Surgeon: Thereasa Solo, MD.    Operative Findings: upper abdomen free of disease. No visible omental disease. Small volume (200cc) ascites. 8cm friable mass replacing left ovary and adherent to the sigmoid colon mesentery and ureter on the left.  Anterior fibroid. This represented an optimal cytoreduction (R0) with no gross visible disease remaining.    02/12/2019  Pathology Results   A. OVARY AND FALLOPIAN TUBE, LEFT, SALPINGO-OOPHORECTOMY: - Serous carcinoma, high grade, status post neoadjuvant therapy - See oncology table and comment below B. UTERUS CERVIX WITH RIGHT FALLOPIAN TUBE AND OVARY, HYSTERECTOMY: Uterus: - Serosal surface involved by serous carcinoma - Endomyometrium uninvolved by carcinoma - Benign endometrial polyp (4.1 cm) - Leiomyomata (5.5 cm; largest) - Adenomyosis Cervix: - Uninvolved by carcinoma Left ovary: - Serous carcinoma, high grade Left fallopian tube: - Serous carcinoma, high grade C. SOFT TISSUE, LEFT PELVIC SIDEWALL TUMOR, EXCISION: - Metastatic serous carcinoma, high-grade D. SOFT TISSUE, SIGMOID COLON MESENTERY, EXCISION: - Metastatic serous carcinoma, high-grade E. OMENTUM, TUMOR RESECTION: - Metastatic serous carcinoma, high-grade OVARY or FALLOPIAN TUBE or PRIMARY PERITONEUM: Procedure: Total hysterectomy and bilateral salpingo-oophorectomy, omentectomy and peritoneal biopsies Specimen Integrity: Fragmented Tumor Site: Left ovary and fallopian tube Ovarian Surface Involvement: Present Fallopian Tube Surface Involvement: Present Tumor Size: 6.3 cm (see comment) Histologic Type: Serous carcinoma Histologic Grade: High-grade Implants: Not applicable Other Tissue/ Organ Involvement: Right ovary, right fallopian tube, omentum, mesentery Largest Extrapelvic Peritoneal Focus: Macroscopic Peritoneal/Ascitic Fluid: Malignant Treatment Effect: No definite or minimal response identified (chemotherapy response score 1 [CRS1] Regional Lymph Nodes: No lymph nodes submitted or found Pathologic Stage Classification (pTNM, AJCC 8th Edition): pT3c, pNX Representative Tumor Block: A4 Comment(s): Additional testing (HER-2, MMR and MSI) are pending. The primary tumor site appears to be the left ovary and fallopian tube. The uterus is only involved on the serosal surface. There is tumor on the anterior peritoneal  reflection.  Addendum: Tumor is Her2 negative,MSI stable   02/19/2019 Tumor Marker   Patient's tumor was tested for the following markers: CA-125 Results of the tumor marker test revealed 40.3   04/01/2019 Tumor Marker   Patient's tumor was tested for the following markers: CA-125 Results of the tumor marker test revealed 8.1    Genetic Testing   Negative testing. No pathogenic variants identified on the [employer]. The report date is 04/17/2019.  Somatic genes analyzed through [facility]: ATM, BARD1, BRCA1, BRCA2, BRIP1, CHEK2, MRE11A, NBN, PALB2, RAD51C, RAD51D.  The CancerNext gene panel offered by [employer] includes sequencing and rearrangement analysis for the following 36 genes: APC*, ATM*, AXIN2, BARD1, BMPR1A, BRCA1*, BRCA2*, BRIP1*, CDH1*, CDK4, CDKN2A, CHEK2*, DICER1, MLH1*, MSH2*, MSH3, MSH6*, MUTYH*, NBN, NF1*, NTHL1, PALB2*, PMS2*, PTEN*, RAD51C*, RAD51D*, RECQL, SMAD4, SMARCA4, STK11 and TP53* (sequencing and deletion/duplication); HOXB13, POLD1 and POLE (sequencing only); EPCAM and GREM1 (deletion/duplication only).    05/21/2019 Imaging   1. Interval hysterectomy, oophorectomy and omentectomy. No evidence of residual ovarian carcinoma in the pelvis. No evidence of peritoneal disease. No intraperitoneal free fluid. 2. Nonobstructing LEFT renal calculi.  Normal ureters.   05/22/2019 Tumor Marker   Patient's tumor was tested for the following markers: CA-125 Results of the tumor marker test revealed 5.7.   06/03/2019 Procedure   Successful right IJ vein Port-A-Cath explant.   08/26/2019 Tumor Marker   Patient's tumor was tested for the following markers: CA-125. Results of the tumor marker test revealed 4.9   10/03/2019 Imaging   1. New tumor deposits  along the capsular surfaces of the liver, spleen, in the left pelvis, and right paracolic gutter, compatible with metastatic disease. 2. Mild dilation of the dorsal pancreatic duct in the  pancreatic body and head, cause uncertain. 3. Nonobstructive left nephrolithiasis. 4. Aortic atherosclerosis.   Aortic Atherosclerosis (ICD10-I70.0   10/10/2019 Procedure   Successful placement of a right IJ approach Power Port with ultrasound and fluoroscopic guidance. The catheter is ready for use.     10/11/2019 Tumor Marker   Patient's tumor was tested for the following markers: CA-125 Results of the tumor marker test revealed 19.6.   10/14/2019 -  Chemotherapy   The patient had carboplatin and gemzar for chemotherapy treatment.     11/04/2019 Tumor Marker   Patient's tumor was tested for the following markers: CA-125 Results of the tumor marker test revealed 11.4   11/28/2019 Tumor Marker   Patient's tumor was tested for the following markers: CA-125 Results of the tumor marker test revealed 5.8   12/20/2019 Imaging   1. Significant interval reduction in mixed solid and cystic nodule in the vicinity of the left ovary, with significant improvement or resolution of multiple pelvic, peritoneal, and organ capsule implants. Resolution of previously seen small volume ascites. Findings are consistent with treatment response of abdominal metastatic disease. 2. Status post hysterectomy, oophorectomy, and omentectomy. 3. Nonobstructive left nephrolithiasis. 4. Aortic Atherosclerosis (ICD10-I70.0).   12/23/2019 Tumor Marker   Patient's tumor was tested for the following markers: CA-125 Results of the tumor marker test revealed 5.0   01/24/2020 Tumor Marker   Patient's tumor was tested for the following markers: CA-125 Results of the tumor marker test revealed 4.8   02/24/2020 Tumor Marker   Patient's tumor was tested for the following markers: CA-125. Results of the tumor marker test revealed 4.3.   03/06/2020 Imaging   1. Status post hysterectomy, bilateral oophorectomy, and omentectomy. 2. Resolution of previously described left adnexal nodularity. 3. No residual disease  identified. 4.  Aortic Atherosclerosis (ICD10-I70.0). 5. Left nephrolithiasis.   04/20/2020 Tumor Marker   Patient's tumor was tested for the following markers: CA-125 Results of the tumor marker test revealed 4.6   05/18/2020 Tumor Marker   Patient's tumor was tested for the following markers: CA-125 Results of the tumor marker test revealed 4.7   05/29/2020 Imaging   Status post hysterectomy and bilateral salpingo oophorectomy.   No evidence of recurrent or metastatic disease.   06/08/2020 - 09/13/2021 Chemotherapy   She was on niraparib       06/17/2020 Tumor Marker   Patient's tumor was tested for the following markers: CA-125 Results of the tumor marker test revealed 4.5   08/04/2020 Tumor Marker   Patient's tumor was tested for the following markers: CA-125. Results of the tumor marker test revealed 5.7   09/07/2020 Imaging   1. Status post hysterectomy and bilateral salpingo oophorectomy. No evidence of recurrent or metastatic disease. 2. Nonobstructing left nephrolithiasis. 3. Left-sided colonic diverticulosis without findings of acute diverticulitis. 4. Aortic atherosclerosis.     09/07/2020 Tumor Marker   Patient's tumor was tested for the following markers: CA-125 Results of the tumor marker test revealed 4.8.   10/21/2020 Tumor Marker   Patient's tumor was tested for the following markers: CA-125. Results of the tumor marker test revealed 5.1.   12/14/2020 Tumor Marker   Patient's tumor was tested for the following markers: CA-125. Results of the tumor marker test revealed 5.5.   04/05/2021 Tumor Marker   Patient's tumor was tested  for the following markers: CA-125. Results of the tumor marker test revealed 5.9.   05/31/2021 Tumor Marker   Patient's tumor was tested for the following markers: CA-125. Results of the tumor marker test revealed 8.1.   09/13/2021 Imaging   1. Interval development of multiple new cystic and solid lesions in the abdomen and pelvis  measuring up to 10.3 x 7.2 cm and consistent with peritoneal metastatic disease. 2. Nonobstructing left renal stones. 3. Aortic Atherosclerosis (ICD10-I70.0).     09/14/2021 Tumor Marker   Patient's tumor was tested for the following markers: CA-125. Results of the tumor marker test revealed 28.5.   09/16/2021 Echocardiogram   1. Left ventricular ejection fraction, by estimation, is 60 to 65%. The left ventricle has normal function. The left ventricle has no regional wall motion abnormalities. Left ventricular diastolic parameters are consistent with Grade I diastolic dysfunction (impaired relaxation).  2. Right ventricular systolic function is normal. The right ventricular size is normal.  3. The mitral valve is normal in structure. Trivial mitral valve regurgitation. No evidence of mitral stenosis.  4. The aortic valve has an indeterminant number of cusps. Aortic valve regurgitation is trivial. No aortic stenosis is present.  5. The inferior vena cava is normal in size with greater than 50% respiratory variability, suggesting right atrial pressure of 3 mmHg.     09/20/2021 -  Chemotherapy   Patient is on Treatment Plan : OVARIAN RECURRENT Liposomal Doxorubicin + Carboplatin q28d X 6 Cycles     09/23/2021 Tumor Marker   Patient's tumor was tested for the following markers: CA-125. Results of the tumor marker test revealed 27.7.     PHYSICAL EXAMINATION: ECOG PERFORMANCE STATUS: 1 - Symptomatic but completely ambulatory  Vitals:   11/22/21 0800  BP: (!) 150/73  Pulse: 77  Resp: 18  Temp: 98.2 F (36.8 C)  SpO2: 100%   Filed Weights   11/22/21 0800  Weight: 150 lb 3.2 oz (68.1 kg)    GENERAL:alert, no distress and comfortable SKIN: skin color, texture, turgor are normal, no rashes or significant lesions EYES: normal, Conjunctiva are pink and non-injected, sclera clear OROPHARYNX:no exudate, no erythema and lips, buccal mucosa, and tongue normal  NECK: supple, thyroid normal  size, non-tender, without nodularity LYMPH:  no palpable lymphadenopathy in the cervical, axillary or inguinal LUNGS: clear to auscultation and percussion with normal breathing effort HEART: regular rate & rhythm and no murmurs and no lower extremity edema ABDOMEN:abdomen soft, non-tender and normal bowel sounds Musculoskeletal:no cyanosis of digits and no clubbing  NEURO: alert & oriented x 3 with fluent speech, no focal motor/sensory deficits  LABORATORY DATA:  I have reviewed the data as listed    Component Value Date/Time   NA 141 10/21/2021 0906   K 3.8 10/21/2021 0906   CL 108 10/21/2021 0906   CO2 28 10/21/2021 0906   GLUCOSE 112 (H) 10/21/2021 0906   BUN 9 10/21/2021 0906   CREATININE 0.49 10/21/2021 0906   CALCIUM 9.6 10/21/2021 0906   PROT 6.4 (L) 10/21/2021 0906   ALBUMIN 4.1 10/21/2021 0906   AST 24 10/21/2021 0906   ALT 22 10/21/2021 0906   ALKPHOS 82 10/21/2021 0906   BILITOT 1.1 10/21/2021 0906   GFRNONAA >60 10/21/2021 0906   GFRAA >60 02/24/2020 0852    No results found for: "SPEP", "UPEP"  Lab Results  Component Value Date   WBC 4.6 11/22/2021   NEUTROABS 3.0 11/22/2021   HGB 11.7 (L) 11/22/2021   HCT 33.8 (L)  11/22/2021   MCV 97.7 11/22/2021   PLT 260 11/22/2021      Chemistry      Component Value Date/Time   NA 141 10/21/2021 0906   K 3.8 10/21/2021 0906   CL 108 10/21/2021 0906   CO2 28 10/21/2021 0906   BUN 9 10/21/2021 0906   CREATININE 0.49 10/21/2021 0906      Component Value Date/Time   CALCIUM 9.6 10/21/2021 0906   ALKPHOS 82 10/21/2021 0906   AST 24 10/21/2021 0906   ALT 22 10/21/2021 0906   BILITOT 1.1 10/21/2021 0906

## 2021-11-22 NOTE — Assessment & Plan Note (Signed)
Her recent treatment was complicated by some mucositis and intermittent mild leukopenia I recommend minimum 3 cycles of therapy before we repeat CT imaging I recommend we proceed with imaging study in 2-1/2 weeks and I will see her back to assess response to therapy I am optimistic that she is responding to treatment already due to resolution of her abdominal symptoms

## 2021-11-22 NOTE — Assessment & Plan Note (Signed)

## 2021-11-23 LAB — CA 125: Cancer Antigen (CA) 125: 28.4 U/mL (ref 0.0–38.1)

## 2021-12-10 ENCOUNTER — Ambulatory Visit (HOSPITAL_COMMUNITY)
Admission: RE | Admit: 2021-12-10 | Discharge: 2021-12-10 | Disposition: A | Discharge status: Home / Self Care | Payer: BC Managed Care – PPO | Source: Ambulatory Visit | Attending: Hematology and Oncology | Admitting: Hematology and Oncology

## 2021-12-10 DIAGNOSIS — N1330 Unspecified hydronephrosis: Secondary | ICD-10-CM | POA: Diagnosis not present

## 2021-12-10 DIAGNOSIS — Z8543 Personal history of malignant neoplasm of ovary: Secondary | ICD-10-CM | POA: Diagnosis not present

## 2021-12-10 DIAGNOSIS — C562 Malignant neoplasm of left ovary: Secondary | ICD-10-CM | POA: Insufficient documentation

## 2021-12-10 DIAGNOSIS — N200 Calculus of kidney: Secondary | ICD-10-CM | POA: Diagnosis not present

## 2021-12-10 DIAGNOSIS — R1904 Left lower quadrant abdominal swelling, mass and lump: Secondary | ICD-10-CM | POA: Diagnosis not present

## 2021-12-10 MED ORDER — SODIUM CHLORIDE (PF) 0.9 % IJ SOLN
INTRAMUSCULAR | Status: AC
  Filled 2021-12-10: qty 50

## 2021-12-10 MED ORDER — IOHEXOL 300 MG/ML  SOLN
100.0000 mL | Freq: Once | INTRAMUSCULAR | Status: AC | PRN
  Administered 2021-12-10: 100 mL via INTRAVENOUS

## 2021-12-13 ENCOUNTER — Other Ambulatory Visit: Payer: Self-pay

## 2021-12-13 NOTE — Progress Notes (Signed)
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   All questions were answered. The patient knows to call the clinic with any problems, questions or concerns. The total time spent in the appointment was {CHL ONC TIME VISIT - GGEZM:[id number]} encounter with patients including review of chart and various tests results, discussions about plan of care and coordination of care plan   [name] Lark, MD 12/13/2021 2:33 PM  INTERVAL HISTORY: Please see below for problem oriented charting. she returns for review of test results She has completed 3 cycles of treatment for recurrent ovarian cancer So far, she tolerated treatment very well  REVIEW OF SYSTEMS:   Constitutional: Denies fevers, chills or abnormal weight loss Eyes: Denies blurriness of vision Ears, nose, mouth, throat, and face: Denies mucositis or sore throat Respiratory: Denies cough, dyspnea or wheezes Cardiovascular: Denies palpitation, chest discomfort or lower extremity swelling Gastrointestinal:  Denies nausea, heartburn or change in bowel habits Skin: Denies abnormal skin rashes Lymphatics: Denies new lymphadenopathy or easy bruising Neurological:Denies numbness, tingling or new weaknesses Behavioral/Psych: Mood is stable, no new changes  All other systems were reviewed with the patient and are negative.  I have reviewed the past medical history, past surgical history, social history and family history with the patient and they are unchanged from previous note.  ALLERGIES:  is allergic to skin adhesives [cyanoacrylate], shrimp [shellfish  allergy], and sulfonamide derivatives.  MEDICATIONS:  Current Outpatient Medications  Medication Sig Dispense Refill   ALPRAZolam (XANAX) 0.25 MG tablet Take 1 tablet by mouth at bedtime as needed for anxiety. 30 tablet 0   carboxymethylcellulose (REFRESH PLUS) 0.5 % SOLN Place 1 drop into both eyes 2 (two) times daily as needed (dry eyes).     Cholecalciferol (VITAMIN D3) 25 MCG (1000 UT) CAPS Take 1,000 Units by mouth daily.      dexamethasone (DECADRON) 0.5 MG/5ML solution Swish 5 mls in mouth then spit out --use  4 times a day as needed 120 mL 0   esomeprazole (NEXIUM) 20 MG capsule Take 20 mg by mouth daily.      estradiol (ESTRACE VAGINAL) 0.1 MG/GM vaginal cream Place 1 Applicatorful vaginally 3 (three) times a week. 42.5 g 12   loratadine (CLARITIN) 10 MG tablet Take 10 mg by mouth daily as needed for allergies.     Multiple Vitamin (MULTIVITAMIN WITH MINERALS) TABS tablet Take 1 tablet by mouth daily.     ondansetron (ZOFRAN) 8 MG tablet Take 1 tablet by mouth 2 times daily as needed. Start on the third day after chemotherapy. 30 tablet 1   prochlorperazine (COMPAZINE) 10 MG tablet TAKE 1 TABLET BY MOUTH EVERY 6 HOURS AS NEEDED FOR NAUSEA OR VOMITING 30 tablet 2   prochlorperazine (COMPAZINE) 10 MG tablet Take 1 tablet by mouth every 6 hours as needed (Nausea or vomiting). 30 tablet 1   No current facility-administered medications for this visit.    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 14.7 cm poorly defined  soft tissue mass in central pelvis suspicious for primary ovarian carcinoma, with uterine leiomyosarcoma considered a less likely differential diagnosis. 2. Moderate ascites and diffuse peritoneal carcinomatosis. 3. Several small uterine fibroids.   11/13/2018 Tumor Marker   Patient's tumor was tested for the  following markers: CA-125 Results of the tumor marker test revealed 1434   11/21/2018 Procedure   Successful ultrasound-guided diagnostic and therapeutic paracentesis yielding 3.1 liters of peritoneal fluid   11/21/2018 Pathology Results   PERITONEAL/ASCITIC FLUID(SPECIMEN 1 OF 1 COLLECTED 11/21/18): ADENOCARCINOMA. Specimen Clinical Information Pelvic mass suspicious for ovarian cancer Source Peritoneal/Ascitic Fluid, (specimen 1 of 1 collected 11/21/18) Gross Specimen: Received is/are 1000 ccs of dark amber fluid. (CM:cm) Prepared: # Smears: 0 # Concentration Technique Slides (i.e. 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   11/29/2018 Initial Diagnosis   Ovarian cancer ([place])   11/29/2018 Cancer Staging   Staging form: Ovary, Fallopian Tube, and Primary Peritoneal Carcinoma, AJCC 8th Edition - Clinical: cT3, cN0, cM0 - Signed by [name] Lark, MD on 11/29/2018   12/03/2018 Procedure   Successful ultrasound-guided therapeutic paracentesis yielding 3.7 liters of peritoneal fluid.     12/06/2018 Procedure   Placement of a subcutaneous port device. Catheter tip at the SVC and right atrium junction   12/14/2018 Procedure   Successful ultrasound-guided paracentesis yielding 2.9 L of peritoneal fluid   12/28/2018 Tumor Marker   Patient's tumor was tested for the following markers: CA-125 Results of the tumor marker test revealed 812.   12/28/2018 - 04/22/2019 Chemotherapy   The patient had carboplatin and taxol for neoadjuvant treatment, followed by interval debulking surgery and subsequent adjuvant chemotherapy treatment.     02/01/2019 Imaging   Ct abdomen and pelvis 8.6 cm left ovarian mass, corresponding to the patient's known primary neoplasm, improved.   Mild peritoneal nodularity/omental caking, improved.   Small abdominopelvic ascites, improved.     02/01/2019 Tumor Marker   Patient's  tumor was tested for the following markers: CA-125 Results of the tumor marker test revealed 183.   02/12/2019 Surgery   Preoperative Diagnosis: Stage IIIC ovarian cancer, s/p neoadjuvant chemotherapy      Procedure(s) Performed: 1. Exploratory laparotomy with total abdominal hysterectomy, bilateral salpingo-oophorectomy, omentectomy radical tumor debulking for ovarian cancer.   Surgeon: Thereasa Solo, MD.    Operative Findings: upper abdomen free of disease. No visible omental disease. Small volume (200cc) ascites. 8cm friable mass replacing left ovary and adherent to the sigmoid colon mesentery and ureter on the left.  Anterior fibroid. This represented an optimal cytoreduction (R0) with no gross visible disease remaining.    02/12/2019 Pathology Results   A. OVARY AND FALLOPIAN TUBE, LEFT, SALPINGO-OOPHORECTOMY: - Serous carcinoma, high grade, status post neoadjuvant therapy - See oncology table and comment below B. UTERUS CERVIX WITH RIGHT FALLOPIAN TUBE AND OVARY, HYSTERECTOMY: Uterus: - Serosal surface involved by serous carcinoma - Endomyometrium uninvolved by carcinoma - Benign endometrial polyp (4.1 cm) - Leiomyomata (5.5 cm; largest) - Adenomyosis Cervix: - Uninvolved by carcinoma Left ovary: - Serous carcinoma, high grade Left fallopian tube: - Serous carcinoma, high grade C. SOFT TISSUE, LEFT PELVIC SIDEWALL TUMOR, EXCISION: - Metastatic serous carcinoma, high-grade D. SOFT TISSUE, SIGMOID COLON MESENTERY, EXCISION: - Metastatic serous carcinoma, high-grade E. OMENTUM, TUMOR RESECTION: - Metastatic serous carcinoma, high-grade OVARY or FALLOPIAN TUBE or PRIMARY PERITONEUM: Procedure: Total hysterectomy and bilateral salpingo-oophorectomy, omentectomy and peritoneal biopsies Specimen Integrity: Fragmented Tumor Site: Left  ovary and fallopian tube Ovarian Surface Involvement: Present Fallopian Tube Surface Involvement: Present Tumor Size: 6.3 cm (see  comment) Histologic Type: Serous carcinoma Histologic Grade: High-grade Implants: Not applicable Other Tissue/ Organ Involvement: Right ovary, right fallopian tube, omentum, mesentery Largest Extrapelvic Peritoneal Focus: Macroscopic Peritoneal/Ascitic Fluid: Malignant Treatment Effect: No definite or minimal response identified (chemotherapy response score 1 [CRS1] Regional Lymph Nodes: No lymph nodes submitted or found Pathologic Stage Classification (pTNM, AJCC 8th Edition): pT3c, pNX Representative Tumor Block: A4 Comment(s): Additional testing (HER-2, MMR and MSI) are pending. The primary tumor site appears to be the left ovary and fallopian tube. The uterus is only involved on the serosal surface. There is tumor on the anterior peritoneal reflection.  Addendum: Tumor is Her2 negative,MSI stable   02/19/2019 Tumor Marker   Patient's tumor was tested for the following markers: CA-125 Results of the tumor marker test revealed 40.3   04/01/2019 Tumor Marker   Patient's tumor was tested for the following markers: CA-125 Results of the tumor marker test revealed 8.1    Genetic Testing   Negative testing. No pathogenic variants identified on the [employer]. The report date is 04/17/2019.  Somatic genes analyzed through [facility]: ATM, BARD1, BRCA1, BRCA2, BRIP1, CHEK2, MRE11A, NBN, PALB2, RAD51C, RAD51D.  The [facility]Next gene panel offered by [employer] includes sequencing and rearrangement analysis for the following 36 genes: APC*, ATM*, AXIN2, BARD1, BMPR1A, BRCA1*, BRCA2*, BRIP1*, CDH1*, CDK4, CDKN2A, CHEK2*, DICER1, MLH1*, MSH2*, MSH3, MSH6*, MUTYH*, NBN, NF1*, NTHL1, PALB2*, PMS2*, PTEN*, RAD51C*, RAD51D*, RECQL, SMAD4, SMARCA4, STK11 and TP53* (sequencing and deletion/duplication); HOXB13, POLD1 and POLE (sequencing only); EPCAM and GREM1 (deletion/duplication only).    05/21/2019 Imaging   1. Interval hysterectomy, oophorectomy and omentectomy. No  evidence of residual ovarian carcinoma in the pelvis. No evidence of peritoneal disease. No intraperitoneal free fluid. 2. Nonobstructing LEFT renal calculi.  Normal ureters.   05/22/2019 Tumor Marker   Patient's tumor was tested for the following markers: CA-125 Results of the tumor marker test revealed 5.7.   06/03/2019 Procedure   Successful right IJ vein Port-A-Cath explant.   08/26/2019 Tumor Marker   Patient's tumor was tested for the following markers: CA-125. Results of the tumor marker test revealed 4.9   10/03/2019 Imaging   1. New tumor deposits along the capsular surfaces of the liver, spleen, in the left pelvis, and right paracolic gutter, compatible with metastatic disease. 2. Mild dilation of the dorsal pancreatic duct in the pancreatic body and head, cause uncertain. 3. Nonobstructive left nephrolithiasis. 4. Aortic atherosclerosis.   Aortic Atherosclerosis (ICD10-I70.0   10/10/2019 Procedure   Successful placement of a right IJ approach Power Port with ultrasound and fluoroscopic guidance. The catheter is ready for use.     10/11/2019 Tumor Marker   Patient's tumor was tested for the following markers: CA-125 Results of the tumor marker test revealed 19.6.   10/14/2019 -  Chemotherapy   The patient had carboplatin and gemzar for chemotherapy treatment.     11/04/2019 Tumor Marker   Patient's tumor was tested for the following markers: CA-125 Results of the tumor marker test revealed 11.4   11/28/2019 Tumor Marker   Patient's tumor was tested for the following markers: CA-125 Results of the tumor marker test revealed 5.8   12/20/2019 Imaging   1. Significant interval reduction in mixed solid and cystic nodule in the vicinity of the left ovary, with significant improvement or resolution of multiple pelvic, peritoneal, and organ capsule implants. Resolution of previously seen  small volume ascites. Findings are consistent with treatment response of abdominal metastatic  disease. 2. Status post hysterectomy, oophorectomy, and omentectomy. 3. Nonobstructive left nephrolithiasis. 4. Aortic Atherosclerosis (ICD10-I70.0).   12/23/2019 Tumor Marker   Patient's tumor was tested for the following markers: CA-125 Results of the tumor marker test revealed 5.0   01/24/2020 Tumor Marker   Patient's tumor was tested for the following markers: CA-125 Results of the tumor marker test revealed 4.8   02/24/2020 Tumor Marker   Patient's tumor was tested for the following markers: CA-125. Results of the tumor marker test revealed 4.3.   03/06/2020 Imaging   1. Status post hysterectomy, bilateral oophorectomy, and omentectomy. 2. Resolution of previously described left adnexal nodularity. 3. No residual disease identified. 4.  Aortic Atherosclerosis (ICD10-I70.0). 5. Left nephrolithiasis.   04/20/2020 Tumor Marker   Patient's tumor was tested for the following markers: CA-125 Results of the tumor marker test revealed 4.6   05/18/2020 Tumor Marker   Patient's tumor was tested for the following markers: CA-125 Results of the tumor marker test revealed 4.7   05/29/2020 Imaging   Status post hysterectomy and bilateral salpingo oophorectomy.   No evidence of recurrent or metastatic disease.   06/08/2020 - 09/13/2021 Chemotherapy   She was on niraparib       06/17/2020 Tumor Marker   Patient's tumor was tested for the following markers: CA-125 Results of the tumor marker test revealed 4.5   08/04/2020 Tumor Marker   Patient's tumor was tested for the following markers: CA-125. Results of the tumor marker test revealed 5.7   09/07/2020 Imaging   1. Status post hysterectomy and bilateral salpingo oophorectomy. No evidence of recurrent or metastatic disease. 2. Nonobstructing left nephrolithiasis. 3. Left-sided colonic diverticulosis without findings of acute diverticulitis. 4. Aortic atherosclerosis.     09/07/2020 Tumor Marker   Patient's tumor was tested for the  following markers: CA-125 Results of the tumor marker test revealed 4.8.   10/21/2020 Tumor Marker   Patient's tumor was tested for the following markers: CA-125. Results of the tumor marker test revealed 5.1.   12/14/2020 Tumor Marker   Patient's tumor was tested for the following markers: CA-125. Results of the tumor marker test revealed 5.5.   04/05/2021 Tumor Marker   Patient's tumor was tested for the following markers: CA-125. Results of the tumor marker test revealed 5.9.   05/31/2021 Tumor Marker   Patient's tumor was tested for the following markers: CA-125. Results of the tumor marker test revealed 8.1.   09/13/2021 Imaging   1. Interval development of multiple new cystic and solid lesions in the abdomen and pelvis measuring up to 10.3 x 7.2 cm and consistent with peritoneal metastatic disease. 2. Nonobstructing left renal stones. 3. Aortic Atherosclerosis (ICD10-I70.0).     09/14/2021 Tumor Marker   Patient's tumor was tested for the following markers: CA-125. Results of the tumor marker test revealed 28.5.   09/16/2021 Echocardiogram   1. Left ventricular ejection fraction, by estimation, is 60 to 65%. The left ventricle has normal function. The left ventricle has no regional wall motion abnormalities. Left ventricular diastolic parameters are consistent with Grade I diastolic dysfunction (impaired relaxation).  2. Right ventricular systolic function is normal. The right ventricular size is normal.  3. The mitral valve is normal in structure. Trivial mitral valve regurgitation. No evidence of mitral stenosis.  4. The aortic valve has an indeterminant number of cusps. Aortic valve regurgitation is trivial. No aortic stenosis is present.  5. The  inferior vena cava is normal in size with greater than 50% respiratory variability, suggesting right atrial pressure of 3 mmHg.     09/20/2021 -  Chemotherapy   Patient is on Treatment Plan : OVARIAN RECURRENT Liposomal Doxorubicin +  Carboplatin q28d X 6 Cycles     09/23/2021 Tumor Marker   Patient's tumor was tested for the following markers: CA-125. Results of the tumor marker test revealed 27.7.   11/24/2021 Tumor Marker   Patient's tumor was tested for the following markers: CA-125. Results of the tumor marker test revealed 28.4.   12/13/2021 Imaging   1. Overall stable peritoneal carcinomatosis. No new disease is demonstrated. 2. Moderate left-sided hydroureteronephrosis likely due to compression of the left ureter by the left pelvic mass. 3. Stable left-sided renal calculi. 4. Numerous left renal calculi.       PHYSICAL EXAMINATION: ECOG PERFORMANCE STATUS: {CHL ONC ECOG PS:[id number]}  There were no vitals filed for this visit. There were no vitals filed for this visit.  GENERAL:alert, no distress and comfortable SKIN: skin color, texture, turgor are normal, no rashes or significant lesions EYES: normal, Conjunctiva are pink and non-injected, sclera clear OROPHARYNX:no exudate, no erythema and lips, buccal mucosa, and tongue normal  NECK: supple, thyroid normal size, non-tender, without nodularity LYMPH:  no palpable lymphadenopathy in the cervical, axillary or inguinal LUNGS: clear to auscultation and percussion with normal breathing effort HEART: regular rate & rhythm and no murmurs and no lower extremity edema ABDOMEN:abdomen soft, non-tender and normal bowel sounds Musculoskeletal:no cyanosis of digits and no clubbing  NEURO: alert & oriented x 3 with fluent speech, no focal motor/sensory deficits  LABORATORY DATA:  I have reviewed the data as listed    Component Value Date/Time   NA 140 11/22/2021 0747   K 3.7 11/22/2021 0747   CL 107 11/22/2021 0747   CO2 27 11/22/2021 0747   GLUCOSE 101 (H) 11/22/2021 0747   BUN 9 11/22/2021 0747   CREATININE 0.48 11/22/2021 0747   CALCIUM 9.0 11/22/2021 0747   PROT 6.3 (L) 11/22/2021 0747   ALBUMIN 3.9 11/22/2021 0747   AST 28 11/22/2021 0747   ALT 28  11/22/2021 0747   ALKPHOS 101 11/22/2021 0747   BILITOT 1.1 11/22/2021 0747   GFRNONAA >60 11/22/2021 0747   GFRAA >60 02/24/2020 0852    No results found for: "SPEP", "UPEP"  Lab Results  Component Value Date   WBC 4.6 11/22/2021   NEUTROABS 3.0 11/22/2021   HGB 11.7 (L) 11/22/2021   HCT 33.8 (L) 11/22/2021   MCV 97.7 11/22/2021   PLT 260 11/22/2021      Chemistry      Component Value Date/Time   NA 140 11/22/2021 0747   K 3.7 11/22/2021 0747   CL 107 11/22/2021 0747   CO2 27 11/22/2021 0747   BUN 9 11/22/2021 0747   CREATININE 0.48 11/22/2021 0747      Component Value Date/Time   CALCIUM 9.0 11/22/2021 0747   ALKPHOS 101 11/22/2021 0747   AST 28 11/22/2021 0747   ALT 28 11/22/2021 0747   BILITOT 1.1 11/22/2021 0747       RADIOGRAPHIC STUDIES: I have personally reviewed the radiological images as listed and agreed with the findings in the report. CT ABDOMEN PELVIS W CONTRAST  Result Date: 12/11/2021 CLINICAL DATA:  History of ovarian cancer. Assess treatment response. EXAM: CT ABDOMEN AND PELVIS WITH CONTRAST TECHNIQUE: Multidetector CT imaging of the abdomen and pelvis was performed using the standard protocol following  bolus administration of intravenous contrast. RADIATION DOSE REDUCTION: This exam was performed according to the departmental dose-optimization program which includes automated exposure control, adjustment of the mA and/or kV according to patient size and/or use of iterative reconstruction technique. CONTRAST:  136mL OMNIPAQUE IOHEXOL 300 MG/ML  SOLN COMPARISON:  CT scan 09/13/2021 and multiple previous imaging studies. FINDINGS: Lower chest: The lung bases are clear of acute process. No pleural effusion or pulmonary lesions. The heart is normal in size. No pericardial effusion. The distal esophagus and aorta are unremarkable. Hepatobiliary: No intrahepatic lesions or biliary dilatation. The gallbladder is unremarkable. No common bile duct dilatation.  Pancreas: No mass, inflammation or ductal dilatation. Spleen: Normal size. No focal lesions. Adrenals/Urinary Tract: Adrenal glands are normal and stable. Numerous left renal calculi again noted. No worrisome renal lesions. Moderate left hydroureteronephrosis likely due to compression of the left ureter by the left pelvic mass. The bladder is grossly normal. Mild mass effect on the left side of the bladder by the pelvic mass. Stomach/Bowel: The stomach, duodenum, small bowel and colon are grossly normal. No obstructive findings. Moderate mass effect on the lower sigmoid colon by the left pelvic mass. Vascular/Lymphatic: The aorta and branch vessels are patent. The major venous structures are patent. Reproductive: Surgically absent. Other: Again demonstrated is widespread peritoneal carcinomatosis. The large cystic and solid pelvic mass in the cul-de-sac measures 10.3 x 10.2 cm. It previously measured 10.2 x 9.3 cm. Largely cystic lesion with some mural nodules in the right lower quadrant measures 6.4 x 4.8 cm on image 52/2. This previously measured 5.2 x 4.8 cm. Solid peritoneal implant between the liver and the right kidney measures 3.1 x 1.9 cm and previously measured 3.6 x 1.7 cm. Solid peritoneal implant slightly more superiorly and posteriorly measures a maximum of 3 cm and is stable. The adjacent cystic component has resolved. Other smaller lesions are stable. No new lesions are identified. Musculoskeletal: No significant bony findings. IMPRESSION: 1. Overall stable peritoneal carcinomatosis. No new disease is demonstrated. 2. Moderate left-sided hydroureteronephrosis likely due to compression of the left ureter by the left pelvic mass. 3. Stable left-sided renal calculi. 4. Numerous left renal calculi. * Tracking Code: BO * Electronically Signed   By: Marijo Sanes M.D.   On: 12/11/2021 15:14

## 2021-12-13 NOTE — Assessment & Plan Note (Signed)
I have reviewed her CT imaging She has stable disease control We discussed the risk and benefits of switching treatment versus continuing treatment

## 2021-12-14 ENCOUNTER — Other Ambulatory Visit: Payer: Self-pay

## 2021-12-14 ENCOUNTER — Encounter: Payer: Self-pay | Admitting: Hematology and Oncology

## 2021-12-14 ENCOUNTER — Inpatient Hospital Stay (HOSPITAL_BASED_OUTPATIENT_CLINIC_OR_DEPARTMENT_OTHER): Payer: BC Managed Care – PPO | Admitting: Hematology and Oncology

## 2021-12-14 DIAGNOSIS — C562 Malignant neoplasm of left ovary: Secondary | ICD-10-CM | POA: Diagnosis not present

## 2021-12-14 DIAGNOSIS — Z5111 Encounter for antineoplastic chemotherapy: Secondary | ICD-10-CM | POA: Diagnosis not present

## 2021-12-14 DIAGNOSIS — C786 Secondary malignant neoplasm of retroperitoneum and peritoneum: Secondary | ICD-10-CM | POA: Diagnosis not present

## 2021-12-14 DIAGNOSIS — Z79899 Other long term (current) drug therapy: Secondary | ICD-10-CM | POA: Diagnosis not present

## 2021-12-14 DIAGNOSIS — Z7189 Other specified counseling: Secondary | ICD-10-CM

## 2021-12-14 DIAGNOSIS — Z5112 Encounter for antineoplastic immunotherapy: Secondary | ICD-10-CM | POA: Diagnosis not present

## 2021-12-14 DIAGNOSIS — D6481 Anemia due to antineoplastic chemotherapy: Secondary | ICD-10-CM | POA: Diagnosis not present

## 2021-12-14 DIAGNOSIS — C563 Malignant neoplasm of bilateral ovaries: Secondary | ICD-10-CM | POA: Diagnosis not present

## 2021-12-14 NOTE — Assessment & Plan Note (Signed)
We have discussions about goals of care With stable disease, she will likely need treatment long-term

## 2021-12-15 ENCOUNTER — Other Ambulatory Visit: Payer: Self-pay

## 2021-12-17 MED FILL — Dexamethasone Sodium Phosphate Inj 100 MG/10ML: INTRAMUSCULAR | Qty: 1 | Status: AC

## 2021-12-17 MED FILL — Fosaprepitant Dimeglumine For IV Infusion 150 MG (Base Eq): INTRAVENOUS | Qty: 5 | Status: AC

## 2021-12-20 ENCOUNTER — Other Ambulatory Visit: Payer: Self-pay | Admitting: Hematology and Oncology

## 2021-12-20 ENCOUNTER — Inpatient Hospital Stay: Payer: BC Managed Care – PPO

## 2021-12-20 ENCOUNTER — Other Ambulatory Visit: Payer: Self-pay

## 2021-12-20 VITALS — BP 151/75 | HR 71 | Temp 97.8°F | Resp 17 | Wt 146.5 lb

## 2021-12-20 DIAGNOSIS — Z5112 Encounter for antineoplastic immunotherapy: Secondary | ICD-10-CM | POA: Diagnosis not present

## 2021-12-20 DIAGNOSIS — C786 Secondary malignant neoplasm of retroperitoneum and peritoneum: Secondary | ICD-10-CM

## 2021-12-20 DIAGNOSIS — C563 Malignant neoplasm of bilateral ovaries: Secondary | ICD-10-CM | POA: Diagnosis not present

## 2021-12-20 DIAGNOSIS — Z7189 Other specified counseling: Secondary | ICD-10-CM

## 2021-12-20 DIAGNOSIS — D6481 Anemia due to antineoplastic chemotherapy: Secondary | ICD-10-CM | POA: Diagnosis not present

## 2021-12-20 DIAGNOSIS — Z79899 Other long term (current) drug therapy: Secondary | ICD-10-CM | POA: Diagnosis not present

## 2021-12-20 DIAGNOSIS — C562 Malignant neoplasm of left ovary: Secondary | ICD-10-CM

## 2021-12-20 DIAGNOSIS — Z5111 Encounter for antineoplastic chemotherapy: Secondary | ICD-10-CM | POA: Diagnosis not present

## 2021-12-20 LAB — CBC WITH DIFFERENTIAL (CANCER CENTER ONLY)
Abs Immature Granulocytes: 0.01 10*3/uL (ref 0.00–0.07)
Basophils Absolute: 0 10*3/uL (ref 0.0–0.1)
Basophils Relative: 1 %
Eosinophils Absolute: 0 10*3/uL (ref 0.0–0.5)
Eosinophils Relative: 1 %
HCT: 34 % — ABNORMAL LOW (ref 36.0–46.0)
Hemoglobin: 11.8 g/dL — ABNORMAL LOW (ref 12.0–15.0)
Immature Granulocytes: 0 %
Lymphocytes Relative: 28 %
Lymphs Abs: 1.1 10*3/uL (ref 0.7–4.0)
MCH: 34.7 pg — ABNORMAL HIGH (ref 26.0–34.0)
MCHC: 34.7 g/dL (ref 30.0–36.0)
MCV: 100 fL (ref 80.0–100.0)
Monocytes Absolute: 0.4 10*3/uL (ref 0.1–1.0)
Monocytes Relative: 11 %
Neutro Abs: 2.3 10*3/uL (ref 1.7–7.7)
Neutrophils Relative %: 59 %
Platelet Count: 219 10*3/uL (ref 150–400)
RBC: 3.4 MIL/uL — ABNORMAL LOW (ref 3.87–5.11)
RDW: 15.2 % (ref 11.5–15.5)
WBC Count: 3.9 10*3/uL — ABNORMAL LOW (ref 4.0–10.5)
nRBC: 0 % (ref 0.0–0.2)

## 2021-12-20 LAB — CMP (CANCER CENTER ONLY)
ALT: 15 U/L (ref 0–44)
AST: 24 U/L (ref 15–41)
Albumin: 4 g/dL (ref 3.5–5.0)
Alkaline Phosphatase: 69 U/L (ref 38–126)
Anion gap: 7 (ref 5–15)
BUN: 9 mg/dL (ref 8–23)
CO2: 26 mmol/L (ref 22–32)
Calcium: 8.8 mg/dL — ABNORMAL LOW (ref 8.9–10.3)
Chloride: 108 mmol/L (ref 98–111)
Creatinine: 0.52 mg/dL (ref 0.44–1.00)
GFR, Estimated: >60 mL/min (ref >60)
Glucose, Bld: 109 mg/dL — ABNORMAL HIGH (ref 70–99)
Potassium: 3.9 mmol/L (ref 3.5–5.1)
Sodium: 141 mmol/L (ref 135–145)
Total Bilirubin: 1.1 mg/dL (ref 0.3–1.2)
Total Protein: 6.4 g/dL — ABNORMAL LOW (ref 6.5–8.1)

## 2021-12-20 LAB — TOTAL PROTEIN, URINE DIPSTICK: Protein, ur: NEGATIVE mg/dL

## 2021-12-20 MED ORDER — SODIUM CHLORIDE 0.9 % IV SOLN
150.0000 mg | Freq: Once | INTRAVENOUS | Status: AC
  Administered 2021-12-20: 150 mg via INTRAVENOUS
  Filled 2021-12-20: qty 150

## 2021-12-20 MED ORDER — DEXTROSE 5 % IV SOLN: Freq: Once | INTRAVENOUS | Status: AC

## 2021-12-20 MED ORDER — DOXORUBICIN HCL LIPOSOMAL CHEMO INJECTION 2 MG/ML
29.0000 mg/m2 | Freq: Once | INTRAVENOUS | Status: AC
  Administered 2021-12-20: 50 mg via INTRAVENOUS
  Filled 2021-12-20: qty 25

## 2021-12-20 MED ORDER — FAMOTIDINE IN NACL 20-0.9 MG/50ML-% IV SOLN
20.0000 mg | Freq: Once | INTRAVENOUS | Status: AC
  Administered 2021-12-20: 20 mg via INTRAVENOUS
  Filled 2021-12-20: qty 50

## 2021-12-20 MED ORDER — SODIUM CHLORIDE 0.9% FLUSH
10.0000 mL | INTRAVENOUS | Status: DC | PRN
  Administered 2021-12-20: 10 mL

## 2021-12-20 MED ORDER — HEPARIN SOD (PORK) LOCK FLUSH 100 UNIT/ML IV SOLN
500.0000 [IU] | Freq: Once | INTRAVENOUS | Status: AC | PRN
  Administered 2021-12-20: 500 [IU]

## 2021-12-20 MED ORDER — DIPHENHYDRAMINE HCL 25 MG PO CAPS
25.0000 mg | ORAL_CAPSULE | Freq: Once | ORAL | Status: AC
  Administered 2021-12-20: 25 mg via ORAL
  Filled 2021-12-20: qty 1

## 2021-12-20 MED ORDER — PALONOSETRON HCL INJECTION 0.25 MG/5ML
0.2500 mg | Freq: Once | INTRAVENOUS | Status: AC
  Administered 2021-12-20: 0.25 mg via INTRAVENOUS
  Filled 2021-12-20: qty 5

## 2021-12-20 MED ORDER — SODIUM CHLORIDE 0.9 % IV SOLN
10.0000 mg/kg | Freq: Once | INTRAVENOUS | Status: AC
  Administered 2021-12-20: 700 mg via INTRAVENOUS
  Filled 2021-12-20: qty 16

## 2021-12-20 MED ORDER — SODIUM CHLORIDE 0.9 % IV SOLN
413.5000 mg | Freq: Once | INTRAVENOUS | Status: AC
  Administered 2021-12-20: 410 mg via INTRAVENOUS
  Filled 2021-12-20: qty 41

## 2021-12-20 MED ORDER — SODIUM CHLORIDE 0.9% FLUSH
10.0000 mL | Freq: Once | INTRAVENOUS | Status: AC
  Administered 2021-12-20: 10 mL

## 2021-12-20 MED ORDER — SODIUM CHLORIDE 0.9 % IV SOLN
10.0000 mg | Freq: Once | INTRAVENOUS | Status: AC
  Administered 2021-12-20: 10 mg via INTRAVENOUS
  Filled 2021-12-20: qty 10

## 2021-12-20 NOTE — Patient Instructions (Signed)
[facility]  Discharge Instructions: Thank you for choosing [facility] to provide your oncology and hematology care.   If you have a lab appointment with the [facility], please go directly to the [facility] and check in at the registration area.   Wear comfortable clothing and clothing appropriate for easy access to any Portacath or PICC line.   We strive to give you quality time with your provider. You may need to reschedule your appointment if you arrive late (15 or more minutes).  Arriving late affects you and other patients whose appointments are after yours.  Also, if you miss three or more appointments without notifying the office, you may be dismissed from the clinic at the provider's discretion.      For prescription refill requests, have your pharmacy contact our office and allow 72 hours for refills to be completed.    Today you received the following chemotherapy and/or immunotherapy agents: Mvasi, Carboplatin, Doxorubicin      To help prevent nausea and vomiting after your treatment, we encourage you to take your nausea medication as directed.  BELOW ARE SYMPTOMS THAT SHOULD BE REPORTED IMMEDIATELY: *FEVER GREATER THAN 100.4 F (38 C) OR HIGHER *CHILLS OR SWEATING *NAUSEA AND VOMITING THAT IS NOT CONTROLLED WITH YOUR NAUSEA MEDICATION *UNUSUAL SHORTNESS OF BREATH *UNUSUAL BRUISING OR BLEEDING *URINARY PROBLEMS (pain or burning when urinating, or frequent urination) *BOWEL PROBLEMS (unusual diarrhea, constipation, pain near the anus) TENDERNESS IN MOUTH AND THROAT WITH OR WITHOUT PRESENCE OF ULCERS (sore throat, sores in mouth, or a toothache) UNUSUAL RASH, SWELLING OR PAIN  UNUSUAL VAGINAL DISCHARGE OR ITCHING   Items with * indicate a potential emergency and should be followed up as soon as possible or go to the Emergency Department if any problems should occur.  Please show the CHEMOTHERAPY ALERT CARD or IMMUNOTHERAPY  ALERT CARD at check-in to the Emergency Department and triage nurse.  Should you have questions after your visit or need to cancel or reschedule your appointment, please contact [facility]  Dept: [phone]  and follow the prompts.  Office hours are 8:00 a.m. to 4:30 p.m. Monday - Friday. Please note that voicemails left after 4:00 p.m. may not be returned until the following business day.  We are closed weekends and major holidays. You have access to a nurse at all times for urgent questions. Please call the main number to the clinic Dept: [phone] and follow the prompts.   For any non-urgent questions, you may also contact your provider using MyChart. We now offer e-Visits for anyone 56 and older to request care online for non-urgent symptoms. For details visit [email or web address].   Also download the MyChart app! Go to the app store, search "MyChart", open the app, select [facility], and log in with your MyChart username and password.  Masks are optional in the cancer centers. If you would like for your care team to wear a mask while they are taking care of you, please let them know. For doctor visits, patients may have with them one support person who is at least 68 years old. At this time, visitors are not allowed in the infusion area. Bevacizumab injection What is this medication? BEVACIZUMAB (be va SIZ yoo mab) is a monoclonal antibody. It is used to treat many types of cancer. This medicine may be used for other purposes; ask your health care provider or pharmacist if you have questions. COMMON BRAND NAME(S): Alymsys, Avastin,  MVASI, Noah Charon What should I tell my care team before I take this medication? They need to know if you have any of these conditions: diabetes heart disease high blood pressure history of coughing up blood prior anthracycline chemotherapy (e.g., doxorubicin, daunorubicin, epirubicin) recent or ongoing radiation  therapy recent or planning to have surgery stroke an unusual or allergic reaction to bevacizumab, hamster proteins, mouse proteins, other medicines, foods, dyes, or preservatives pregnant or trying to get pregnant breast-feeding How should I use this medication? This medicine is for infusion into a vein. It is given by a health care professional in a hospital or clinic setting. Talk to your pediatrician regarding the use of this medicine in children. Special care may be needed. Overdosage: If you think you have taken too much of this medicine contact a poison control center or emergency room at once. NOTE: This medicine is only for you. Do not share this medicine with others. What if I miss a dose? It is important not to miss your dose. Call your doctor or health care professional if you are unable to keep an appointment. What may interact with this medication? Interactions are not expected. This list may not describe all possible interactions. Give your health care provider a list of all the medicines, herbs, non-prescription drugs, or dietary supplements you use. Also tell them if you smoke, drink alcohol, or use illegal drugs. Some items may interact with your medicine. What should I watch for while using this medication? Your condition will be monitored carefully while you are receiving this medicine. You will need important blood work and urine testing done while you are taking this medicine. This medicine may increase your risk to bruise or bleed. Call your doctor or health care professional if you notice any unusual bleeding. Before having surgery, talk to your health care provider to make sure it is ok. This drug can increase the risk of poor healing of your surgical site or wound. You will need to stop this drug for 28 days before surgery. After surgery, wait at least 28 days before restarting this drug. Make sure the surgical site or wound is healed enough before restarting this drug.  Talk to your health care provider if questions. Do not become pregnant while taking this medicine or for 6 months after stopping it. Women should inform their doctor if they wish to become pregnant or think they might be pregnant. There is a potential for serious side effects to an unborn child. Talk to your health care professional or pharmacist for more information. Do not breast-feed an infant while taking this medicine and for 6 months after the last dose. This medicine has caused ovarian failure in some women. This medicine may interfere with the ability to have a child. You should talk to your doctor or health care professional if you are concerned about your fertility. What side effects may I notice from receiving this medication? Side effects that you should report to your doctor or health care professional as soon as possible: allergic reactions like skin rash, itching or hives, swelling of the face, lips, or tongue chest pain or chest tightness chills coughing up blood high fever seizures severe constipation signs and symptoms of bleeding such as bloody or black, tarry stools; red or dark-brown urine; spitting up blood or brown material that looks like coffee grounds; red spots on the skin; unusual bruising or bleeding from the eye, gums, or nose signs and symptoms of a blood clot such as breathing problems;  chest pain; severe, sudden headache; pain, swelling, warmth in the leg signs and symptoms of a stroke like changes in vision; confusion; trouble speaking or understanding; severe headaches; sudden numbness or weakness of the face, arm or leg; trouble walking; dizziness; loss of balance or coordination stomach pain sweating swelling of legs or ankles vomiting weight gain Side effects that usually do not require medical attention (report to your doctor or health care professional if they continue or are bothersome): back pain changes in taste decreased appetite dry  skin nausea tiredness This list may not describe all possible side effects. Call your doctor for medical advice about side effects. You may report side effects to FDA at 1-800-FDA-1088. Where should I keep my medication? This drug is given in a hospital or clinic and will not be stored at home. NOTE: This sheet is a summary. It may not cover all possible information. If you have questions about this medicine, talk to your doctor, pharmacist, or health care provider.  2023 Elsevier/Gold Standard (2021-04-09 00:00:00)

## 2021-12-21 ENCOUNTER — Other Ambulatory Visit: Payer: Self-pay

## 2021-12-21 LAB — CA 125: Cancer Antigen (CA) 125: 24.7 U/mL (ref 0.0–38.1)

## 2021-12-27 ENCOUNTER — Other Ambulatory Visit (HOSPITAL_COMMUNITY): Payer: Self-pay

## 2021-12-29 ENCOUNTER — Other Ambulatory Visit (HOSPITAL_COMMUNITY): Payer: Self-pay

## 2022-01-01 ENCOUNTER — Other Ambulatory Visit: Payer: Self-pay

## 2022-01-03 ENCOUNTER — Inpatient Hospital Stay: Payer: BLUE CROSS/BLUE SHIELD

## 2022-01-03 ENCOUNTER — Encounter: Payer: Self-pay | Admitting: Hematology and Oncology

## 2022-01-03 ENCOUNTER — Inpatient Hospital Stay: Payer: BLUE CROSS/BLUE SHIELD | Attending: Gynecologic Oncology | Admitting: Hematology and Oncology

## 2022-01-03 ENCOUNTER — Other Ambulatory Visit: Payer: Self-pay

## 2022-01-03 DIAGNOSIS — C562 Malignant neoplasm of left ovary: Secondary | ICD-10-CM | POA: Diagnosis not present

## 2022-01-03 DIAGNOSIS — Z7189 Other specified counseling: Secondary | ICD-10-CM

## 2022-01-03 DIAGNOSIS — F411 Generalized anxiety disorder: Secondary | ICD-10-CM | POA: Diagnosis not present

## 2022-01-03 DIAGNOSIS — Z5112 Encounter for antineoplastic immunotherapy: Secondary | ICD-10-CM | POA: Diagnosis not present

## 2022-01-03 DIAGNOSIS — Z79899 Other long term (current) drug therapy: Secondary | ICD-10-CM | POA: Insufficient documentation

## 2022-01-03 DIAGNOSIS — Z5111 Encounter for antineoplastic chemotherapy: Secondary | ICD-10-CM | POA: Diagnosis not present

## 2022-01-03 DIAGNOSIS — C786 Secondary malignant neoplasm of retroperitoneum and peritoneum: Secondary | ICD-10-CM | POA: Insufficient documentation

## 2022-01-03 MED ORDER — HEPARIN SOD (PORK) LOCK FLUSH 100 UNIT/ML IV SOLN
500.0000 [IU] | Freq: Once | INTRAVENOUS | Status: AC | PRN
  Administered 2022-01-03: 500 [IU]

## 2022-01-03 MED ORDER — SODIUM CHLORIDE 0.9% FLUSH
10.0000 mL | INTRAVENOUS | Status: DC | PRN
  Administered 2022-01-03: 10 mL

## 2022-01-03 MED ORDER — SODIUM CHLORIDE 0.9 % IV SOLN: INTRAVENOUS | Status: DC

## 2022-01-03 MED ORDER — SODIUM CHLORIDE 0.9 % IV SOLN
10.0000 mg/kg | Freq: Once | INTRAVENOUS | Status: AC
  Administered 2022-01-03: 700 mg via INTRAVENOUS
  Filled 2022-01-03: qty 16

## 2022-01-03 NOTE — Assessment & Plan Note (Signed)
She has significant anxiety lately and complains of fatigue I suspect she might not be sleeping well We discussed strategies to alleviate anxiety including medications, counseling and other nonpharmacological interventions such as exercise

## 2022-01-03 NOTE — Patient Instructions (Signed)
[facility]  Discharge Instructions: Thank you for choosing [facility] to provide your oncology and hematology care.   If you have a lab appointment with the [facility], please go directly to the [facility] and check in at the registration area.   Wear comfortable clothing and clothing appropriate for easy access to any Portacath or PICC line.   We strive to give you quality time with your provider. You may need to reschedule your appointment if you arrive late (15 or more minutes).  Arriving late affects you and other patients whose appointments are after yours.  Also, if you miss three or more appointments without notifying the office, you may be dismissed from the clinic at the provider's discretion.      For prescription refill requests, have your pharmacy contact our office and allow 72 hours for refills to be completed.    Today you received the following chemotherapy and/or immunotherapy agents: Mvasi      To help prevent nausea and vomiting after your treatment, we encourage you to take your nausea medication as directed.  BELOW ARE SYMPTOMS THAT SHOULD BE REPORTED IMMEDIATELY: *FEVER GREATER THAN 100.4 F (38 C) OR HIGHER *CHILLS OR SWEATING *NAUSEA AND VOMITING THAT IS NOT CONTROLLED WITH YOUR NAUSEA MEDICATION *UNUSUAL SHORTNESS OF BREATH *UNUSUAL BRUISING OR BLEEDING *URINARY PROBLEMS (pain or burning when urinating, or frequent urination) *BOWEL PROBLEMS (unusual diarrhea, constipation, pain near the anus) TENDERNESS IN MOUTH AND THROAT WITH OR WITHOUT PRESENCE OF ULCERS (sore throat, sores in mouth, or a toothache) UNUSUAL RASH, SWELLING OR PAIN  UNUSUAL VAGINAL DISCHARGE OR ITCHING   Items with * indicate a potential emergency and should be followed up as soon as possible or go to the Emergency Department if any problems should occur.  Please show the CHEMOTHERAPY ALERT CARD or IMMUNOTHERAPY ALERT CARD at check-in to the  Emergency Department and triage nurse.  Should you have questions after your visit or need to cancel or reschedule your appointment, please contact [facility]  Dept: [phone]  and follow the prompts.  Office hours are 8:00 a.m. to 4:30 p.m. Monday - Friday. Please note that voicemails left after 4:00 p.m. may not be returned until the following business day.  We are closed weekends and major holidays. You have access to a nurse at all times for urgent questions. Please call the main number to the clinic Dept: [phone] and follow the prompts.   For any non-urgent questions, you may also contact your provider using MyChart. We now offer e-Visits for anyone 31 and older to request care online for non-urgent symptoms. For details visit [email or web address].   Also download the MyChart app! Go to the app store, search "MyChart", open the app, select [facility], and log in with your MyChart username and password.  Masks are optional in the cancer centers. If you would like for your care team to wear a mask while they are taking care of you, please let them know. For doctor visits, patients may have with them one support person who is at least 68 years old. At this time, visitors are not allowed in the infusion area. Bevacizumab injection What is this medication? BEVACIZUMAB (be va SIZ yoo mab) is a monoclonal antibody. It is used to treat many types of cancer. This medicine may be used for other purposes; ask your health care provider or pharmacist if you have questions. COMMON BRAND NAME(S): Alymsys, Avastin, MVASI, Noah Charon  What should I tell my care team before I take this medication? They need to know if you have any of these conditions: diabetes heart disease high blood pressure history of coughing up blood prior anthracycline chemotherapy (e.g., doxorubicin, daunorubicin, epirubicin) recent or ongoing radiation therapy recent or planning to have  surgery stroke an unusual or allergic reaction to bevacizumab, hamster proteins, mouse proteins, other medicines, foods, dyes, or preservatives pregnant or trying to get pregnant breast-feeding How should I use this medication? This medicine is for infusion into a vein. It is given by a health care professional in a hospital or clinic setting. Talk to your pediatrician regarding the use of this medicine in children. Special care may be needed. Overdosage: If you think you have taken too much of this medicine contact a poison control center or emergency room at once. NOTE: This medicine is only for you. Do not share this medicine with others. What if I miss a dose? It is important not to miss your dose. Call your doctor or health care professional if you are unable to keep an appointment. What may interact with this medication? Interactions are not expected. This list may not describe all possible interactions. Give your health care provider a list of all the medicines, herbs, non-prescription drugs, or dietary supplements you use. Also tell them if you smoke, drink alcohol, or use illegal drugs. Some items may interact with your medicine. What should I watch for while using this medication? Your condition will be monitored carefully while you are receiving this medicine. You will need important blood work and urine testing done while you are taking this medicine. This medicine may increase your risk to bruise or bleed. Call your doctor or health care professional if you notice any unusual bleeding. Before having surgery, talk to your health care provider to make sure it is ok. This drug can increase the risk of poor healing of your surgical site or wound. You will need to stop this drug for 28 days before surgery. After surgery, wait at least 28 days before restarting this drug. Make sure the surgical site or wound is healed enough before restarting this drug. Talk to your health care provider if  questions. Do not become pregnant while taking this medicine or for 6 months after stopping it. Women should inform their doctor if they wish to become pregnant or think they might be pregnant. There is a potential for serious side effects to an unborn child. Talk to your health care professional or pharmacist for more information. Do not breast-feed an infant while taking this medicine and for 6 months after the last dose. This medicine has caused ovarian failure in some women. This medicine may interfere with the ability to have a child. You should talk to your doctor or health care professional if you are concerned about your fertility. What side effects may I notice from receiving this medication? Side effects that you should report to your doctor or health care professional as soon as possible: allergic reactions like skin rash, itching or hives, swelling of the face, lips, or tongue chest pain or chest tightness chills coughing up blood high fever seizures severe constipation signs and symptoms of bleeding such as bloody or black, tarry stools; red or dark-brown urine; spitting up blood or brown material that looks like coffee grounds; red spots on the skin; unusual bruising or bleeding from the eye, gums, or nose signs and symptoms of a blood clot such as breathing problems; chest pain;  severe, sudden headache; pain, swelling, warmth in the leg signs and symptoms of a stroke like changes in vision; confusion; trouble speaking or understanding; severe headaches; sudden numbness or weakness of the face, arm or leg; trouble walking; dizziness; loss of balance or coordination stomach pain sweating swelling of legs or ankles vomiting weight gain Side effects that usually do not require medical attention (report to your doctor or health care professional if they continue or are bothersome): back pain changes in taste decreased appetite dry skin nausea tiredness This list may not describe  all possible side effects. Call your doctor for medical advice about side effects. You may report side effects to FDA at 1-800-FDA-1088. Where should I keep my medication? This drug is given in a hospital or clinic and will not be stored at home. NOTE: This sheet is a summary. It may not cover all possible information. If you have questions about this medicine, talk to your doctor, pharmacist, or health care provider.  2023 Elsevier/Gold Standard (2021-04-09 00:00:00)

## 2022-01-03 NOTE — Progress Notes (Signed)
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   All questions were answered. The patient knows to call the clinic with any problems, questions or concerns. The total time spent in the appointment was 20 minutes encounter with patients including review of chart and various tests results, discussions about plan of care and coordination of care plan   Stacy Lark, MD 01/03/2022 12:25 PM  INTERVAL HISTORY: Please see below for problem oriented charting. she returns for treatment follow-up on maintenance bevacizumab today She complains of feeling anxious and fatigue She does not know whether she is sleep well or not She is attempting to exercise whenever she can No other major side effects from treatment so far  REVIEW OF SYSTEMS:   Constitutional: Denies fevers, chills or abnormal weight loss Eyes: Denies blurriness of vision Ears, nose, mouth, throat, and face: Denies mucositis or sore throat Respiratory: Denies cough, dyspnea or wheezes Cardiovascular: Denies palpitation, chest discomfort or lower extremity swelling Gastrointestinal:  Denies nausea, heartburn or change in bowel habits Skin: Denies abnormal skin rashes Lymphatics: Denies new lymphadenopathy or easy bruising Neurological:Denies numbness, tingling or new  weaknesses Behavioral/Psych: Mood is stable, no new changes  All other systems were reviewed with the patient and are negative.  I have reviewed the past medical history, past surgical history, social history and family history with the patient and they are unchanged from previous note.  ALLERGIES:  is allergic to skin adhesives [cyanoacrylate], shrimp [shellfish allergy], and sulfonamide derivatives.  MEDICATIONS:  Current Outpatient Medications  Medication Sig Dispense Refill   ALPRAZolam (XANAX) 0.25 MG tablet Take 1 tablet by mouth at bedtime as needed for anxiety. 30 tablet 0   carboxymethylcellulose (REFRESH PLUS) 0.5 % SOLN Place 1 drop into both eyes 2 (two) times daily as needed (dry eyes).     Cholecalciferol (VITAMIN D3) 25 MCG (1000 UT) CAPS Take 1,000 Units by mouth daily.      dexamethasone (DECADRON) 0.5 MG/5ML solution Swish 5 mls in mouth then spit out --use  4 times a day as needed 120 mL 0   esomeprazole (NEXIUM) 20 MG capsule Take 20 mg by mouth daily.      estradiol (ESTRACE VAGINAL) 0.1 MG/GM vaginal cream Place 1 Applicatorful vaginally 3 (three) times a week. 42.5 g 12   loratadine (CLARITIN) 10 MG tablet Take 10 mg by mouth daily as needed for allergies.     Multiple Vitamin (MULTIVITAMIN WITH MINERALS) TABS tablet Take 1 tablet by mouth daily.     ondansetron (ZOFRAN) 8 MG tablet Take 1 tablet by mouth 2 times daily as needed. Start on the third day after chemotherapy. 30 tablet 1   prochlorperazine (COMPAZINE) 10 MG tablet TAKE 1 TABLET BY MOUTH EVERY 6 HOURS AS NEEDED FOR NAUSEA OR VOMITING 30 tablet 2   prochlorperazine (COMPAZINE) 10 MG tablet Take 1 tablet by mouth every 6 hours as needed (Nausea  or vomiting). 30 tablet 1   No current facility-administered medications for this visit.   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 14.7 cm poorly defined soft tissue mass in central pelvis suspicious for primary ovarian carcinoma, with uterine leiomyosarcoma considered a less likely differential diagnosis. 2. Moderate ascites and diffuse peritoneal carcinomatosis. 3. Several small uterine fibroids.   11/13/2018 Tumor Marker   Patient's tumor was tested for the following markers: CA-125 Results of the tumor marker test revealed 1434   11/21/2018 Procedure   Successful ultrasound-guided diagnostic and therapeutic paracentesis yielding 3.1 liters of peritoneal fluid   11/21/2018 Pathology Results   PERITONEAL/ASCITIC FLUID(SPECIMEN 1 OF 1 COLLECTED 11/21/18): ADENOCARCINOMA. Specimen Clinical Information Pelvic mass suspicious for ovarian cancer Source Peritoneal/Ascitic Fluid, (specimen 1 of 1 collected 11/21/18) Gross Specimen: Received is/are 1000 ccs of dark amber fluid. (CM:cm) Prepared: # Smears: 0 # Concentration Technique Slides (i.e. 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   11/29/2018 Initial Diagnosis   Ovarian cancer ([place])   11/29/2018 Cancer Staging   Staging form: Ovary, Fallopian Tube, and Primary Peritoneal Carcinoma, AJCC 8th Edition - Clinical: cT3, cN0, cM0 - Signed by Stacy Lark, MD on 11/29/2018   12/03/2018 Procedure   Successful ultrasound-guided therapeutic paracentesis yielding 3.7 liters of peritoneal fluid.      12/06/2018 Procedure   Placement of a subcutaneous port device. Catheter tip at the SVC and right atrium junction   12/14/2018 Procedure   Successful ultrasound-guided paracentesis yielding 2.9 L of peritoneal fluid   12/28/2018 Tumor Marker   Patient's tumor was tested for the following markers: CA-125 Results of the tumor marker test revealed 812.   12/28/2018 - 04/22/2019 Chemotherapy   The patient had carboplatin and taxol for neoadjuvant treatment, followed by interval debulking surgery and subsequent adjuvant chemotherapy treatment.     02/01/2019 Imaging   Ct abdomen and pelvis 8.6 cm left ovarian mass, corresponding to the patient's known primary neoplasm, improved.   Mild peritoneal nodularity/omental caking, improved.   Small abdominopelvic ascites, improved.     02/01/2019 Tumor Marker   Patient's tumor was tested for the following markers: CA-125 Results of the tumor marker test revealed 183.   02/12/2019 Surgery   Preoperative Diagnosis: Stage IIIC ovarian cancer, s/p neoadjuvant chemotherapy      Procedure(s) Performed: 1. Exploratory laparotomy with total abdominal hysterectomy, bilateral salpingo-oophorectomy, omentectomy radical tumor debulking for ovarian cancer.   Surgeon: Thereasa Solo, MD.    Operative Findings: upper abdomen free of disease. No visible omental disease. Small volume (200cc) ascites. 8cm friable mass replacing left ovary and adherent to the sigmoid colon mesentery and ureter on the left.  Anterior fibroid. This represented an optimal cytoreduction (R0) with no gross visible disease  remaining.    02/12/2019 Pathology Results   A. OVARY AND FALLOPIAN TUBE, LEFT, SALPINGO-OOPHORECTOMY: - Serous carcinoma, high grade, status post neoadjuvant therapy - See oncology table and comment below B. UTERUS CERVIX WITH RIGHT FALLOPIAN TUBE AND OVARY, HYSTERECTOMY: Uterus: - Serosal surface involved by serous carcinoma - Endomyometrium uninvolved by  carcinoma - Benign endometrial polyp (4.1 cm) - Leiomyomata (5.5 cm; largest) - Adenomyosis Cervix: - Uninvolved by carcinoma Left ovary: - Serous carcinoma, high grade Left fallopian tube: - Serous carcinoma, high grade C. SOFT TISSUE, LEFT PELVIC SIDEWALL TUMOR, EXCISION: - Metastatic serous carcinoma, high-grade D. SOFT TISSUE, SIGMOID COLON MESENTERY, EXCISION: - Metastatic serous carcinoma, high-grade E. OMENTUM, TUMOR RESECTION: - Metastatic serous carcinoma, high-grade OVARY or FALLOPIAN TUBE or PRIMARY PERITONEUM: Procedure: Total hysterectomy and bilateral salpingo-oophorectomy, omentectomy and peritoneal biopsies Specimen Integrity: Fragmented Tumor Site: Left ovary and fallopian tube Ovarian Surface Involvement: Present Fallopian Tube Surface Involvement: Present Tumor Size: 6.3 cm (see comment) Histologic Type: Serous carcinoma Histologic Grade: High-grade Implants: Not applicable Other Tissue/ Organ Involvement: Right ovary, right fallopian tube, omentum, mesentery Largest Extrapelvic Peritoneal Focus: Macroscopic Peritoneal/Ascitic Fluid: Malignant Treatment Effect: No definite or minimal response identified (chemotherapy response score 1 [CRS1] Regional Lymph Nodes: No lymph nodes submitted or found Pathologic Stage Classification (pTNM, AJCC 8th Edition): pT3c, pNX Representative Tumor Block: A4 Comment(s): Additional testing (HER-2, MMR and MSI) are pending. The primary tumor site appears to be the left ovary and fallopian tube. The uterus is only involved on the serosal surface. There is tumor on the anterior peritoneal reflection.  Addendum: Tumor is Her2 negative,MSI stable   02/19/2019 Tumor Marker   Patient's tumor was tested for the following markers: CA-125 Results of the tumor marker test revealed 40.3   04/01/2019 Tumor Marker   Patient's tumor was tested for the following markers: CA-125 Results of the tumor marker test revealed 8.1    Genetic  Testing   Negative testing. No pathogenic variants identified on the [employer]. The report date is 04/17/2019.  Somatic genes analyzed through [facility]: ATM, BARD1, BRCA1, BRCA2, BRIP1, CHEK2, MRE11A, NBN, PALB2, RAD51C, RAD51D.  The CancerNext gene panel offered by [employer] includes sequencing and rearrangement analysis for the following 36 genes: APC*, ATM*, AXIN2, BARD1, BMPR1A, BRCA1*, BRCA2*, BRIP1*, CDH1*, CDK4, CDKN2A, CHEK2*, DICER1, MLH1*, MSH2*, MSH3, MSH6*, MUTYH*, NBN, NF1*, NTHL1, PALB2*, PMS2*, PTEN*, RAD51C*, RAD51D*, RECQL, SMAD4, SMARCA4, STK11 and TP53* (sequencing and deletion/duplication); HOXB13, POLD1 and POLE (sequencing only); EPCAM and GREM1 (deletion/duplication only).    05/21/2019 Imaging   1. Interval hysterectomy, oophorectomy and omentectomy. No evidence of residual ovarian carcinoma in the pelvis. No evidence of peritoneal disease. No intraperitoneal free fluid. 2. Nonobstructing LEFT renal calculi.  Normal ureters.   05/22/2019 Tumor Marker   Patient's tumor was tested for the following markers: CA-125 Results of the tumor marker test revealed 5.7.   06/03/2019 Procedure   Successful right IJ vein Port-A-Cath explant.   08/26/2019 Tumor Marker   Patient's tumor was tested for the following markers: CA-125. Results of the tumor marker test revealed 4.9   10/03/2019 Imaging   1. New tumor deposits along the capsular surfaces of the liver, spleen, in the left pelvis, and right paracolic gutter, compatible with metastatic disease. 2. Mild dilation of the dorsal pancreatic duct in the pancreatic body and head, cause uncertain. 3. Nonobstructive left nephrolithiasis. 4. Aortic atherosclerosis.   Aortic Atherosclerosis (ICD10-I70.0   10/10/2019 Procedure   Successful placement of a right IJ approach Power  Port with ultrasound and fluoroscopic guidance. The catheter is ready for use.     10/11/2019 Tumor Marker   Patient's  tumor was tested for the following markers: CA-125 Results of the tumor marker test revealed 19.6.   10/14/2019 -  Chemotherapy   The patient had carboplatin and gemzar for chemotherapy treatment.     11/04/2019 Tumor Marker   Patient's tumor was tested for the following markers: CA-125 Results of the tumor marker test revealed 11.4   11/28/2019 Tumor Marker   Patient's tumor was tested for the following markers: CA-125 Results of the tumor marker test revealed 5.8   12/20/2019 Imaging   1. Significant interval reduction in mixed solid and cystic nodule in the vicinity of the left ovary, with significant improvement or resolution of multiple pelvic, peritoneal, and organ capsule implants. Resolution of previously seen small volume ascites. Findings are consistent with treatment response of abdominal metastatic disease. 2. Status post hysterectomy, oophorectomy, and omentectomy. 3. Nonobstructive left nephrolithiasis. 4. Aortic Atherosclerosis (ICD10-I70.0).   12/23/2019 Tumor Marker   Patient's tumor was tested for the following markers: CA-125 Results of the tumor marker test revealed 5.0   01/24/2020 Tumor Marker   Patient's tumor was tested for the following markers: CA-125 Results of the tumor marker test revealed 4.8   02/24/2020 Tumor Marker   Patient's tumor was tested for the following markers: CA-125. Results of the tumor marker test revealed 4.3.   03/06/2020 Imaging   1. Status post hysterectomy, bilateral oophorectomy, and omentectomy. 2. Resolution of previously described left adnexal nodularity. 3. No residual disease identified. 4.  Aortic Atherosclerosis (ICD10-I70.0). 5. Left nephrolithiasis.   04/20/2020 Tumor Marker   Patient's tumor was tested for the following markers: CA-125 Results of the tumor marker test revealed 4.6   05/18/2020 Tumor Marker   Patient's tumor was tested for the following markers: CA-125 Results of the tumor marker test revealed 4.7    05/29/2020 Imaging   Status post hysterectomy and bilateral salpingo oophorectomy.   No evidence of recurrent or metastatic disease.   06/08/2020 - 09/13/2021 Chemotherapy   She was on niraparib       06/17/2020 Tumor Marker   Patient's tumor was tested for the following markers: CA-125 Results of the tumor marker test revealed 4.5   08/04/2020 Tumor Marker   Patient's tumor was tested for the following markers: CA-125. Results of the tumor marker test revealed 5.7   09/07/2020 Imaging   1. Status post hysterectomy and bilateral salpingo oophorectomy. No evidence of recurrent or metastatic disease. 2. Nonobstructing left nephrolithiasis. 3. Left-sided colonic diverticulosis without findings of acute diverticulitis. 4. Aortic atherosclerosis.     09/07/2020 Tumor Marker   Patient's tumor was tested for the following markers: CA-125 Results of the tumor marker test revealed 4.8.   10/21/2020 Tumor Marker   Patient's tumor was tested for the following markers: CA-125. Results of the tumor marker test revealed 5.1.   12/14/2020 Tumor Marker   Patient's tumor was tested for the following markers: CA-125. Results of the tumor marker test revealed 5.5.   04/05/2021 Tumor Marker   Patient's tumor was tested for the following markers: CA-125. Results of the tumor marker test revealed 5.9.   05/31/2021 Tumor Marker   Patient's tumor was tested for the following markers: CA-125. Results of the tumor marker test revealed 8.1.   09/13/2021 Imaging   1. Interval development of multiple new cystic and solid lesions in the abdomen and pelvis measuring up to 10.3  x 7.2 cm and consistent with peritoneal metastatic disease. 2. Nonobstructing left renal stones. 3. Aortic Atherosclerosis (ICD10-I70.0).     09/14/2021 Tumor Marker   Patient's tumor was tested for the following markers: CA-125. Results of the tumor marker test revealed 28.5.   09/16/2021 Echocardiogram   1. Left ventricular  ejection fraction, by estimation, is 60 to 65%. The left ventricle has normal function. The left ventricle has no regional wall motion abnormalities. Left ventricular diastolic parameters are consistent with Grade I diastolic dysfunction (impaired relaxation).  2. Right ventricular systolic function is normal. The right ventricular size is normal.  3. The mitral valve is normal in structure. Trivial mitral valve regurgitation. No evidence of mitral stenosis.  4. The aortic valve has an indeterminant number of cusps. Aortic valve regurgitation is trivial. No aortic stenosis is present.  5. The inferior vena cava is normal in size with greater than 50% respiratory variability, suggesting right atrial pressure of 3 mmHg.     09/20/2021 -  Chemotherapy   Patient is on Treatment Plan : OVARIAN RECURRENT Liposomal Doxorubicin + Carboplatin q28d X 6 Cycles     09/23/2021 Tumor Marker   Patient's tumor was tested for the following markers: CA-125. Results of the tumor marker test revealed 27.7.   11/24/2021 Tumor Marker   Patient's tumor was tested for the following markers: CA-125. Results of the tumor marker test revealed 28.4.   12/13/2021 Imaging   1. Overall stable peritoneal carcinomatosis. No new disease is demonstrated. 2. Moderate left-sided hydroureteronephrosis likely due to compression of the left ureter by the left pelvic mass. 3. Stable left-sided renal calculi. 4. Numerous left renal calculi.     12/22/2021 Tumor Marker   Patient's tumor was tested for the following markers: CA-125. Results of the tumor marker test revealed 24.7.     PHYSICAL EXAMINATION: ECOG PERFORMANCE STATUS: 1 - Symptomatic but completely ambulatory  Vitals:   01/03/22 1104  BP: (!) 142/74  Pulse: 88  Resp: 18  SpO2: 100%   Filed Weights   01/03/22 1104  Weight: 145 lb (65.8 kg)    GENERAL:alert, no distress and comfortable NEURO: alert & oriented x 3 with fluent speech, no focal motor/sensory  deficits  LABORATORY DATA:  I have reviewed the data as listed    Component Value Date/Time   NA 141 12/20/2021 0744   K 3.9 12/20/2021 0744   CL 108 12/20/2021 0744   CO2 26 12/20/2021 0744   GLUCOSE 109 (H) 12/20/2021 0744   BUN 9 12/20/2021 0744   CREATININE 0.52 12/20/2021 0744   CALCIUM 8.8 (L) 12/20/2021 0744   PROT 6.4 (L) 12/20/2021 0744   ALBUMIN 4.0 12/20/2021 0744   AST 24 12/20/2021 0744   ALT 15 12/20/2021 0744   ALKPHOS 69 12/20/2021 0744   BILITOT 1.1 12/20/2021 0744   GFRNONAA >60 12/20/2021 0744   GFRAA >60 02/24/2020 0852    No results found for: "SPEP", "UPEP"  Lab Results  Component Value Date   WBC 3.9 (L) 12/20/2021   NEUTROABS 2.3 12/20/2021   HGB 11.8 (L) 12/20/2021   HCT 34.0 (L) 12/20/2021   MCV 100.0 12/20/2021   PLT 219 12/20/2021      Chemistry      Component Value Date/Time   NA 141 12/20/2021 0744   K 3.9 12/20/2021 0744   CL 108 12/20/2021 0744   CO2 26 12/20/2021 0744   BUN 9 12/20/2021 0744   CREATININE 0.52 12/20/2021 0744  Component Value Date/Time   CALCIUM 8.8 (L) 12/20/2021 0744   ALKPHOS 69 12/20/2021 0744   AST 24 12/20/2021 0744   ALT 15 12/20/2021 0744   BILITOT 1.1 12/20/2021 0744

## 2022-01-03 NOTE — Assessment & Plan Note (Signed)
Her last CT imaging showed stable disease control She tolerated recent treatment well without major side effects We discussed importance of close monitoring of blood pressure

## 2022-01-05 ENCOUNTER — Other Ambulatory Visit: Payer: Self-pay

## 2022-01-06 ENCOUNTER — Telehealth: Payer: Self-pay

## 2022-01-06 NOTE — Telephone Encounter (Signed)
Returned her call. She is asking if she can take sleep3 that has melatonin. Told her that she can take. She verbalized understanding.  She thinks that she may have a cold or sinus infection. Denies fever. Complaining of sore throat from sinus drainage. Hoarseness at time. She is taking Airborne, using saline nasal spray, gargling with salt water. She is going to call the office back for worsening symptoms. She said that she try tylenol sinus to see if it helps.

## 2022-01-09 ENCOUNTER — Other Ambulatory Visit: Payer: Self-pay

## 2022-01-14 MED FILL — Fosaprepitant Dimeglumine For IV Infusion 150 MG (Base Eq): INTRAVENOUS | Qty: 5 | Status: AC

## 2022-01-14 MED FILL — Dexamethasone Sodium Phosphate Inj 100 MG/10ML: INTRAMUSCULAR | Qty: 1 | Status: AC

## 2022-01-17 ENCOUNTER — Inpatient Hospital Stay (HOSPITAL_BASED_OUTPATIENT_CLINIC_OR_DEPARTMENT_OTHER): Payer: BLUE CROSS/BLUE SHIELD | Admitting: Hematology and Oncology

## 2022-01-17 ENCOUNTER — Inpatient Hospital Stay: Payer: BLUE CROSS/BLUE SHIELD

## 2022-01-17 ENCOUNTER — Other Ambulatory Visit: Payer: Self-pay

## 2022-01-17 ENCOUNTER — Telehealth: Payer: Self-pay

## 2022-01-17 VITALS — BP 163/82 | HR 74

## 2022-01-17 VITALS — BP 165/93 | HR 73 | Resp 18 | Ht 62.0 in | Wt 147.6 lb

## 2022-01-17 DIAGNOSIS — Z7189 Other specified counseling: Secondary | ICD-10-CM

## 2022-01-17 DIAGNOSIS — C562 Malignant neoplasm of left ovary: Secondary | ICD-10-CM | POA: Diagnosis not present

## 2022-01-17 DIAGNOSIS — D61818 Other pancytopenia: Secondary | ICD-10-CM

## 2022-01-17 DIAGNOSIS — Z5181 Encounter for therapeutic drug level monitoring: Secondary | ICD-10-CM | POA: Diagnosis not present

## 2022-01-17 DIAGNOSIS — C786 Secondary malignant neoplasm of retroperitoneum and peritoneum: Secondary | ICD-10-CM

## 2022-01-17 DIAGNOSIS — Z5112 Encounter for antineoplastic immunotherapy: Secondary | ICD-10-CM | POA: Diagnosis not present

## 2022-01-17 DIAGNOSIS — Z79899 Other long term (current) drug therapy: Secondary | ICD-10-CM

## 2022-01-17 DIAGNOSIS — Z7969 Long term (current) use of other immunomodulators and immunosuppressants: Secondary | ICD-10-CM

## 2022-01-17 DIAGNOSIS — K1231 Oral mucositis (ulcerative) due to antineoplastic therapy: Secondary | ICD-10-CM

## 2022-01-17 DIAGNOSIS — R030 Elevated blood-pressure reading, without diagnosis of hypertension: Secondary | ICD-10-CM

## 2022-01-17 DIAGNOSIS — I10 Essential (primary) hypertension: Secondary | ICD-10-CM | POA: Insufficient documentation

## 2022-01-17 DIAGNOSIS — Z5111 Encounter for antineoplastic chemotherapy: Secondary | ICD-10-CM | POA: Diagnosis not present

## 2022-01-17 LAB — CMP (CANCER CENTER ONLY)
ALT: 41 U/L (ref 0–44)
AST: 60 U/L — ABNORMAL HIGH (ref 15–41)
Albumin: 4 g/dL (ref 3.5–5.0)
Alkaline Phosphatase: 101 U/L (ref 38–126)
Anion gap: 6 (ref 5–15)
BUN: 9 mg/dL (ref 8–23)
CO2: 26 mmol/L (ref 22–32)
Calcium: 8.8 mg/dL — ABNORMAL LOW (ref 8.9–10.3)
Chloride: 108 mmol/L (ref 98–111)
Creatinine: 0.42 mg/dL — ABNORMAL LOW (ref 0.44–1.00)
GFR, Estimated: >60 mL/min (ref >60)
Glucose, Bld: 102 mg/dL — ABNORMAL HIGH (ref 70–99)
Potassium: 4 mmol/L (ref 3.5–5.1)
Sodium: 140 mmol/L (ref 135–145)
Total Bilirubin: 1 mg/dL (ref 0.3–1.2)
Total Protein: 6.1 g/dL — ABNORMAL LOW (ref 6.5–8.1)

## 2022-01-17 LAB — CBC WITH DIFFERENTIAL (CANCER CENTER ONLY)
Abs Immature Granulocytes: 0.02 10*3/uL (ref 0.00–0.07)
Basophils Absolute: 0 10*3/uL (ref 0.0–0.1)
Basophils Relative: 1 %
Eosinophils Absolute: 0 10*3/uL (ref 0.0–0.5)
Eosinophils Relative: 1 %
HCT: 32 % — ABNORMAL LOW (ref 36.0–46.0)
Hemoglobin: 11.5 g/dL — ABNORMAL LOW (ref 12.0–15.0)
Immature Granulocytes: 1 %
Lymphocytes Relative: 28 %
Lymphs Abs: 0.9 10*3/uL (ref 0.7–4.0)
MCH: 36.2 pg — ABNORMAL HIGH (ref 26.0–34.0)
MCHC: 35.9 g/dL (ref 30.0–36.0)
MCV: 100.6 fL — ABNORMAL HIGH (ref 80.0–100.0)
Monocytes Absolute: 0.4 10*3/uL (ref 0.1–1.0)
Monocytes Relative: 13 %
Neutro Abs: 1.8 10*3/uL (ref 1.7–7.7)
Neutrophils Relative %: 56 %
Platelet Count: 167 10*3/uL (ref 150–400)
RBC: 3.18 MIL/uL — ABNORMAL LOW (ref 3.87–5.11)
RDW: 14.3 % (ref 11.5–15.5)
WBC Count: 3.2 10*3/uL — ABNORMAL LOW (ref 4.0–10.5)
nRBC: 0 % (ref 0.0–0.2)

## 2022-01-17 LAB — TOTAL PROTEIN, URINE DIPSTICK: Protein, ur: NEGATIVE mg/dL

## 2022-01-17 IMAGING — MG DIGITAL SCREENING BILAT W/ TOMO W/ CAD
6 of 10 series · 6 of 30 positions shown · non-contrast
Comparison: Previous exam(s).

CLINICAL DATA: Screening.

EXAM:
DIGITAL SCREENING BILATERAL MAMMOGRAM WITH TOMO AND CAD

[R MLO synth-2D (1 of 2)]
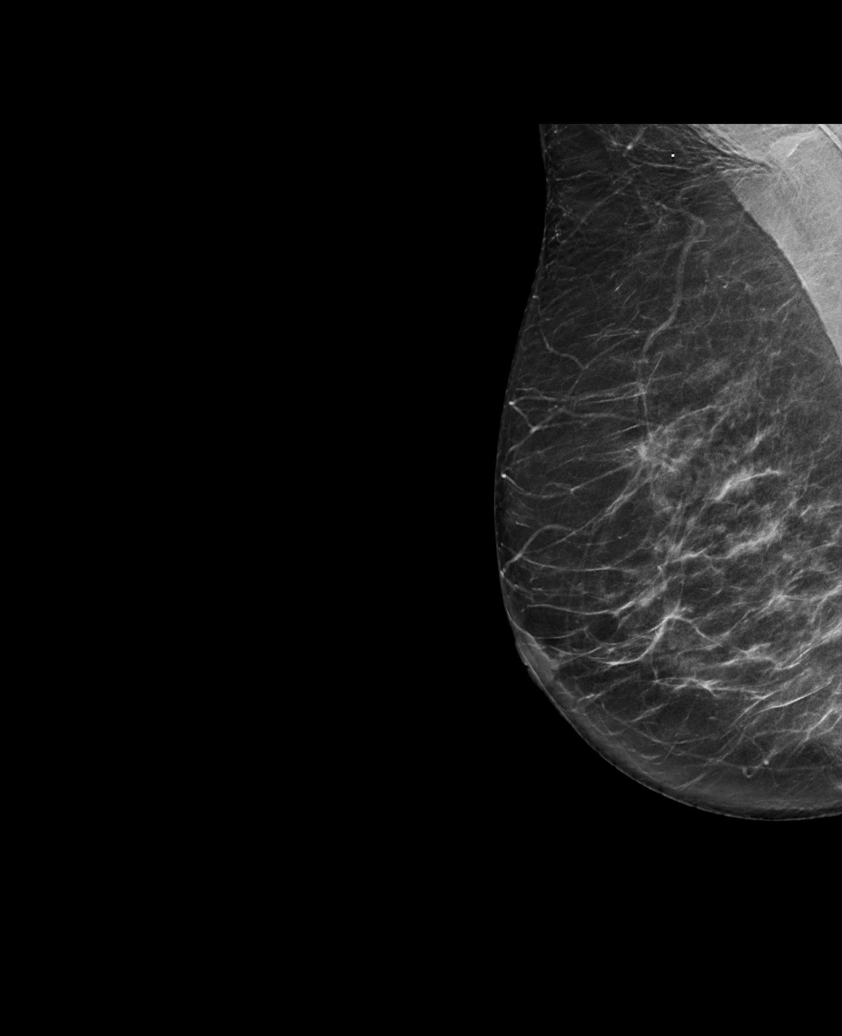

[L MLO synth-2D]
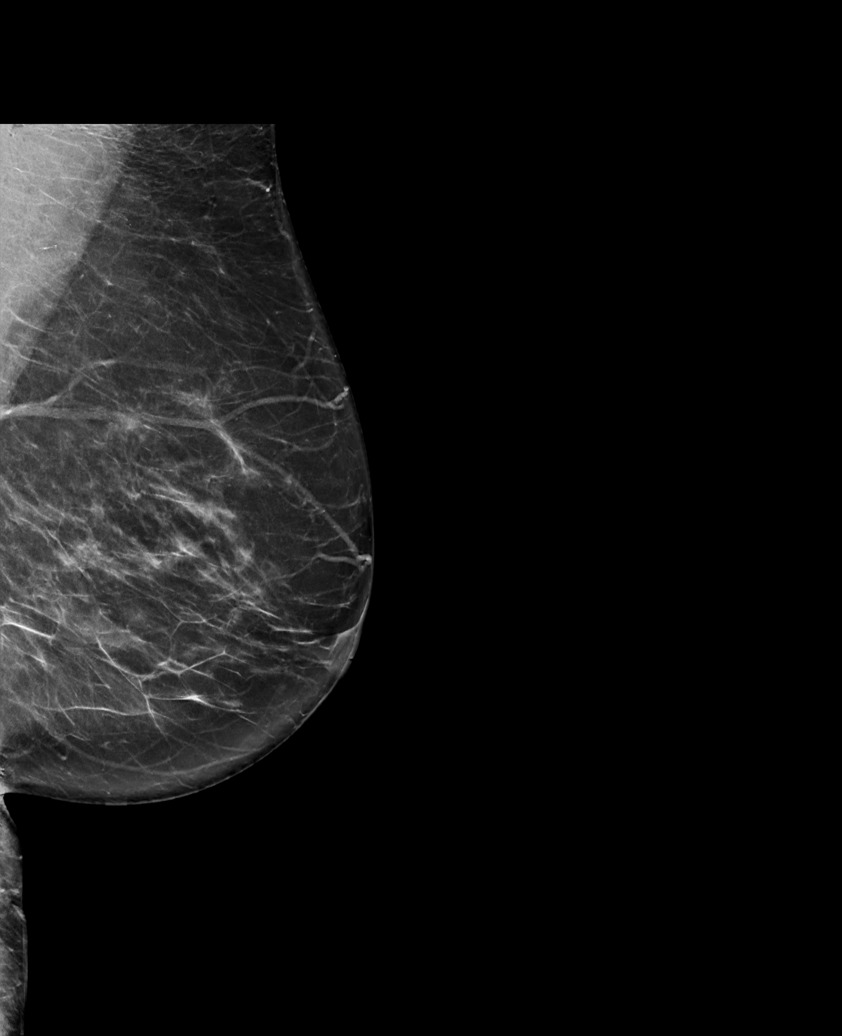

[L CC synth-2D]
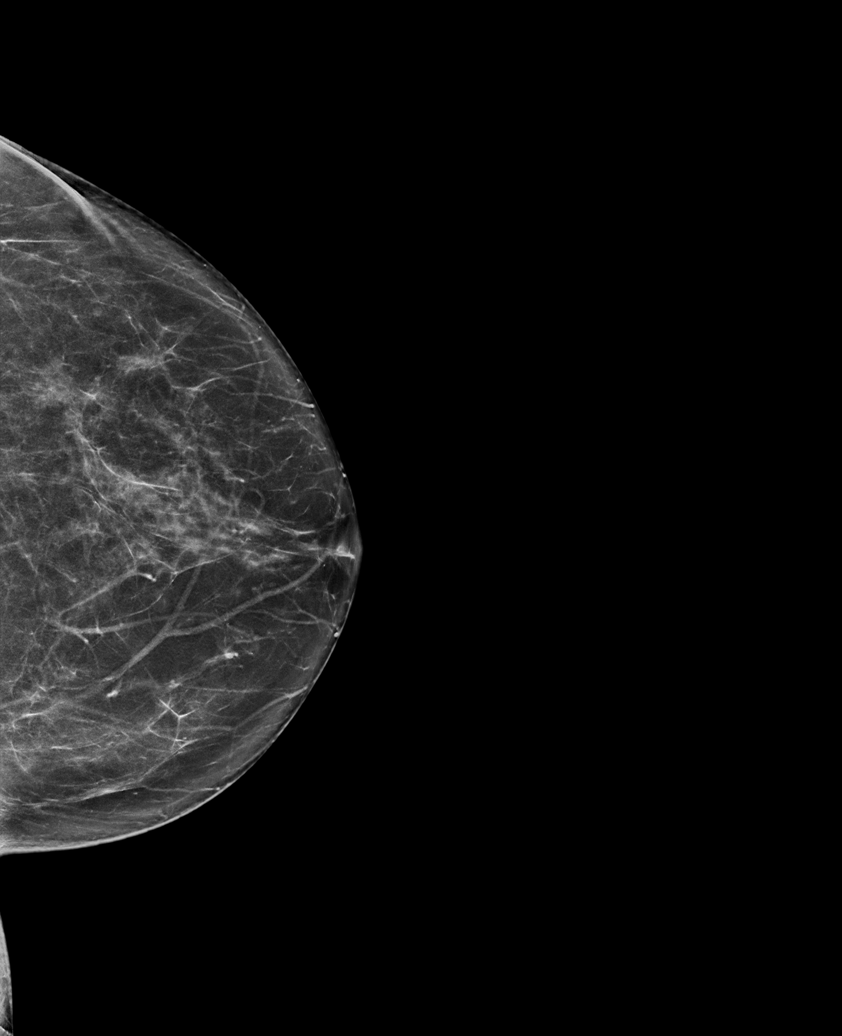

[R MLO synth-2D (2 of 2)]
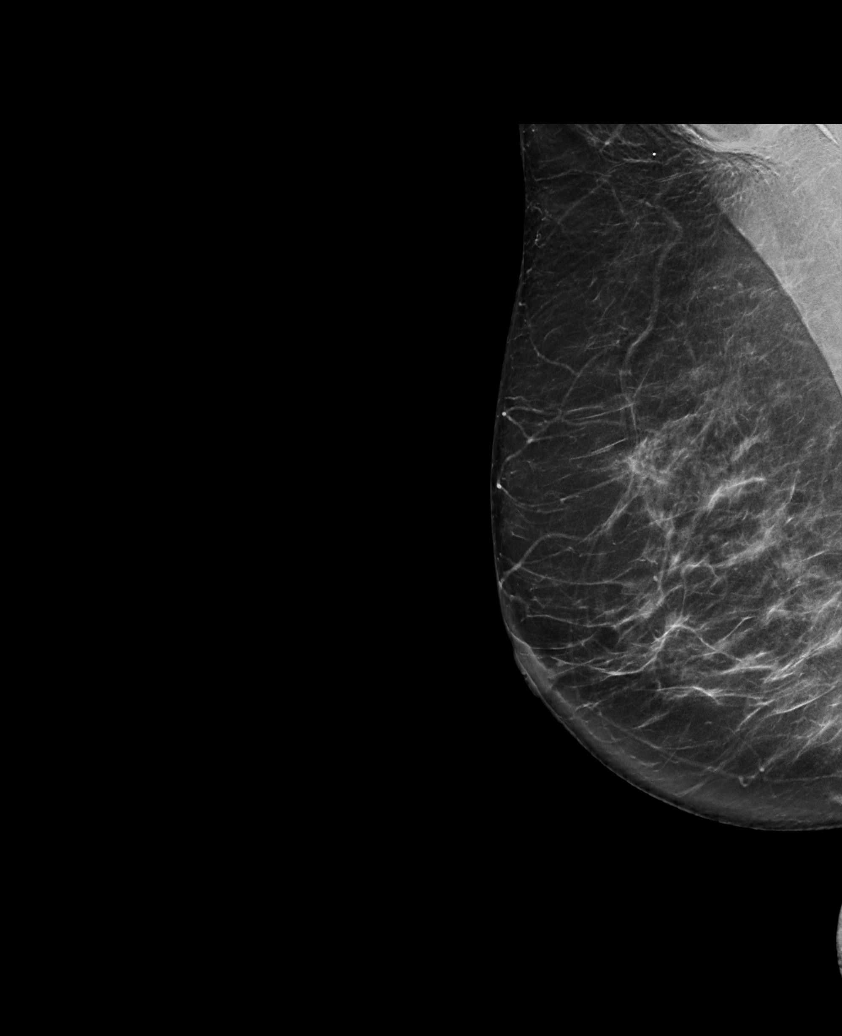

[R CC synth-2D]
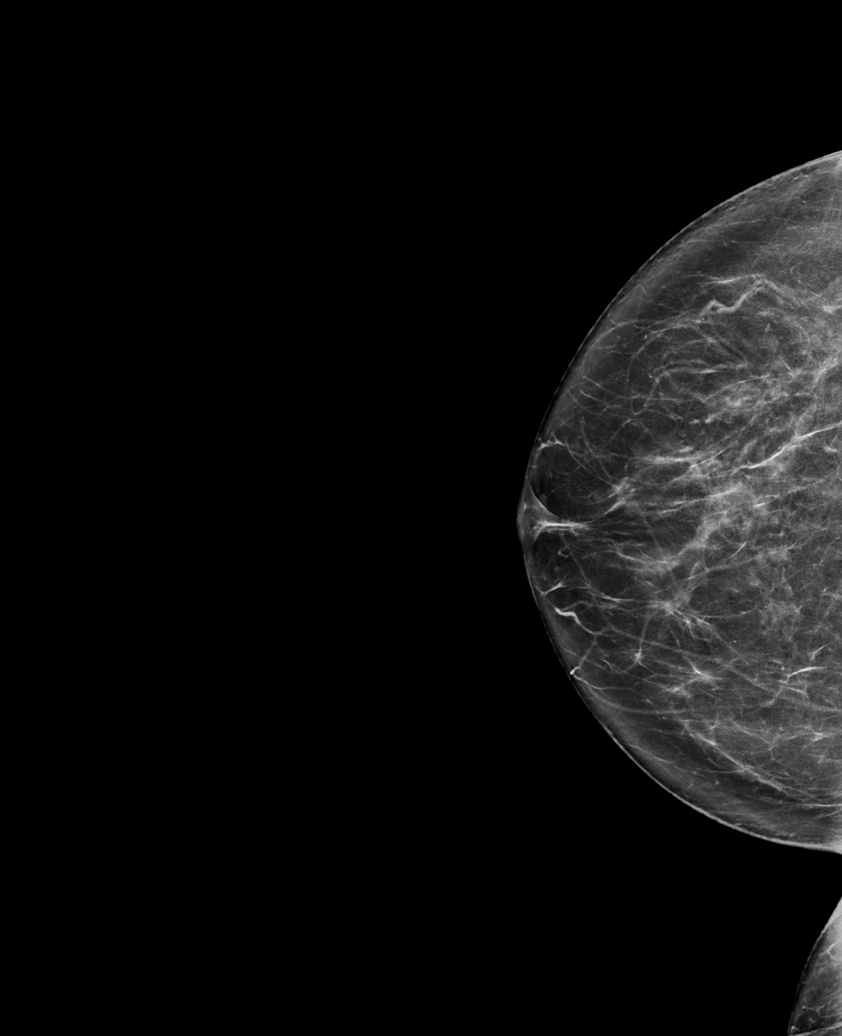

[R MLO tomo · tomo slice 39/78.0]
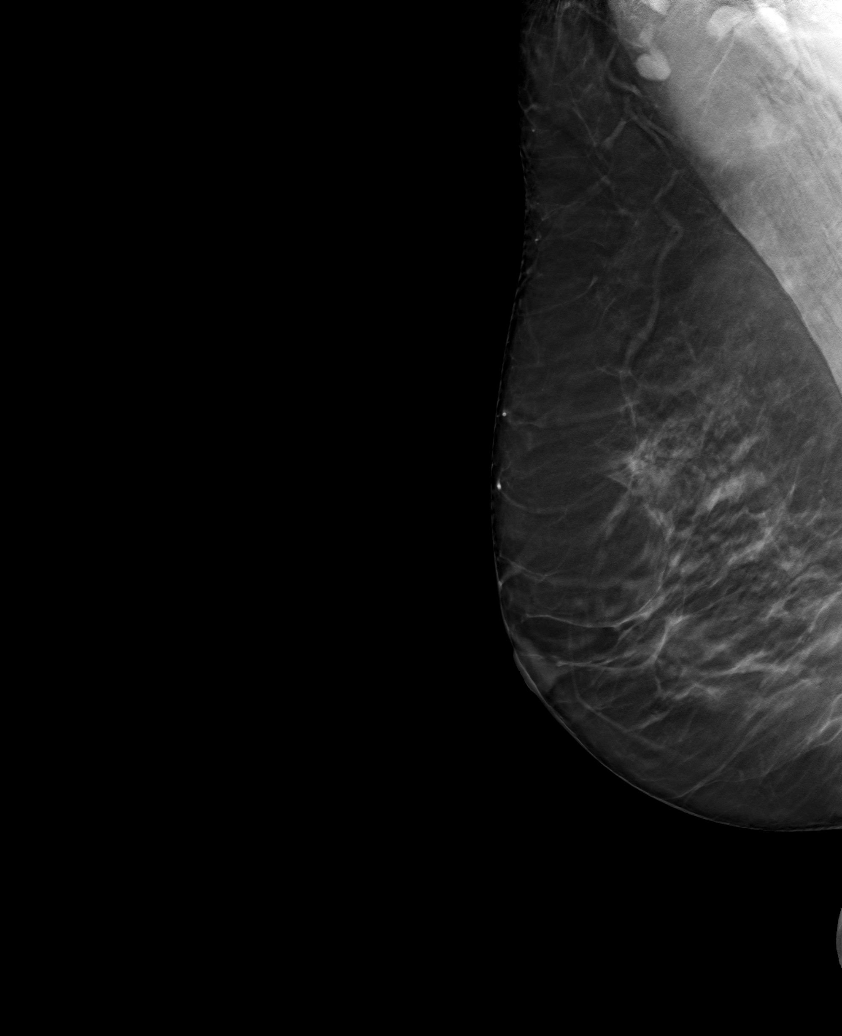

[6 of 30 positions shown; findings below may reference images not displayed]

ACR Breast Density Category c: The breast tissue is heterogeneously
dense, which may obscure small masses.
FINDINGS: There are no findings suspicious for malignancy. Images were
processed with CAD.
IMPRESSION: No mammographic evidence of malignancy. A result letter of this
screening mammogram will be mailed directly to the patient.

RECOMMENDATION:
Screening mammogram in one year. (Code:[id number])

BI-RADS CATEGORY  1: Negative.

## 2022-01-17 MED ORDER — HEPARIN SOD (PORK) LOCK FLUSH 100 UNIT/ML IV SOLN
500.0000 [IU] | Freq: Once | INTRAVENOUS | Status: AC | PRN
  Administered 2022-01-17: 500 [IU]

## 2022-01-17 MED ORDER — DIPHENHYDRAMINE HCL 25 MG PO CAPS
25.0000 mg | ORAL_CAPSULE | Freq: Once | ORAL | Status: AC
  Administered 2022-01-17: 25 mg via ORAL
  Filled 2022-01-17: qty 1

## 2022-01-17 MED ORDER — FAMOTIDINE IN NACL 20-0.9 MG/50ML-% IV SOLN
20.0000 mg | Freq: Once | INTRAVENOUS | Status: AC
  Administered 2022-01-17: 20 mg via INTRAVENOUS
  Filled 2022-01-17: qty 50

## 2022-01-17 MED ORDER — SODIUM CHLORIDE 0.9 % IV SOLN
408.5000 mg | Freq: Once | INTRAVENOUS | Status: AC
  Administered 2022-01-17: 410 mg via INTRAVENOUS
  Filled 2022-01-17: qty 41

## 2022-01-17 MED ORDER — SODIUM CHLORIDE 0.9 % IV SOLN
150.0000 mg | Freq: Once | INTRAVENOUS | Status: AC
  Administered 2022-01-17: 150 mg via INTRAVENOUS
  Filled 2022-01-17: qty 150

## 2022-01-17 MED ORDER — SODIUM CHLORIDE 0.9 % IV SOLN: INTRAVENOUS | Status: DC

## 2022-01-17 MED ORDER — SODIUM CHLORIDE 0.9 % IV SOLN
10.0000 mg | Freq: Once | INTRAVENOUS | Status: AC
  Administered 2022-01-17: 10 mg via INTRAVENOUS
  Filled 2022-01-17: qty 10

## 2022-01-17 MED ORDER — SODIUM CHLORIDE 0.9 % IV SOLN
10.0000 mg/kg | Freq: Once | INTRAVENOUS | Status: AC
  Administered 2022-01-17: 700 mg via INTRAVENOUS
  Filled 2022-01-17: qty 16

## 2022-01-17 MED ORDER — SODIUM CHLORIDE 0.9% FLUSH
10.0000 mL | Freq: Once | INTRAVENOUS | Status: AC
  Administered 2022-01-17: 10 mL

## 2022-01-17 MED ORDER — PALONOSETRON HCL INJECTION 0.25 MG/5ML
0.2500 mg | Freq: Once | INTRAVENOUS | Status: AC
  Administered 2022-01-17: 0.25 mg via INTRAVENOUS
  Filled 2022-01-17: qty 5

## 2022-01-17 MED ORDER — SODIUM CHLORIDE 0.9% FLUSH
10.0000 mL | INTRAVENOUS | Status: DC | PRN
  Administered 2022-01-17: 10 mL

## 2022-01-17 NOTE — Telephone Encounter (Signed)
Called and left a message. Ask her to call the office back for questions. ECHO scheduled at [facility] on 9/6 at 9 am, arrive at [place].

## 2022-01-17 NOTE — Patient Instructions (Signed)
[facility] ONCOLOGY  Discharge Instructions: Thank you for choosing [facility] to provide your oncology and hematology care.   If you have a lab appointment with the [facility], please go directly to the [facility] and check in at the registration area.   Wear comfortable clothing and clothing appropriate for easy access to any Portacath or PICC line.   We strive to give you quality time with your provider. You may need to reschedule your appointment if you arrive late (15 or more minutes).  Arriving late affects you and other patients whose appointments are after yours.  Also, if you miss three or more appointments without notifying the office, you may be dismissed from the clinic at the provider's discretion.      For prescription refill requests, have your pharmacy contact our office and allow 72 hours for refills to be completed.    Today you received the following chemotherapy and/or immunotherapy agents: Bevacizumab, Carboplatin.       To help prevent nausea and vomiting after your treatment, we encourage you to take your nausea medication as directed.  BELOW ARE SYMPTOMS THAT SHOULD BE REPORTED IMMEDIATELY: *FEVER GREATER THAN 100.4 F (38 C) OR HIGHER *CHILLS OR SWEATING *NAUSEA AND VOMITING THAT IS NOT CONTROLLED WITH YOUR NAUSEA MEDICATION *UNUSUAL SHORTNESS OF BREATH *UNUSUAL BRUISING OR BLEEDING *URINARY PROBLEMS (pain or burning when urinating, or frequent urination) *BOWEL PROBLEMS (unusual diarrhea, constipation, pain near the anus) TENDERNESS IN MOUTH AND THROAT WITH OR WITHOUT PRESENCE OF ULCERS (sore throat, sores in mouth, or a toothache) UNUSUAL RASH, SWELLING OR PAIN  UNUSUAL VAGINAL DISCHARGE OR ITCHING   Items with * indicate a potential emergency and should be followed up as soon as possible or go to the Emergency Department if any problems should occur.  Please show the CHEMOTHERAPY ALERT CARD or IMMUNOTHERAPY ALERT  CARD at check-in to the Emergency Department and triage nurse.  Should you have questions after your visit or need to cancel or reschedule your appointment, please contact [facility]  Dept: [phone]  and follow the prompts.  Office hours are 8:00 a.m. to 4:30 p.m. Monday - Friday. Please note that voicemails left after 4:00 p.m. may not be returned until the following business day.  We are closed weekends and major holidays. You have access to a nurse at all times for urgent questions. Please call the main number to the clinic Dept: [phone] and follow the prompts.   For any non-urgent questions, you may also contact your provider using MyChart. We now offer e-Visits for anyone 72 and older to request care online for non-urgent symptoms. For details visit [email or web address].   Also download the MyChart app! Go to the app store, search "MyChart", open the app, select , and log in with your MyChart username and password.  Masks are optional in the cancer centers. If you would like for your care team to wear a mask while they are taking care of you, please let them know. You may have one support person who is at least 68 years old accompany you for your appointments.

## 2022-01-18 ENCOUNTER — Encounter: Payer: Self-pay | Admitting: Hematology and Oncology

## 2022-01-18 ENCOUNTER — Telehealth: Payer: Self-pay

## 2022-01-18 DIAGNOSIS — D61818 Other pancytopenia: Secondary | ICD-10-CM | POA: Insufficient documentation

## 2022-01-18 LAB — CA 125: Cancer Antigen (CA) 125: 9.9 U/mL (ref 0.0–38.1)

## 2022-01-18 NOTE — Assessment & Plan Note (Signed)
Her last CT imaging showed stable disease control She tolerated recent treatment well without major side effects We discussed importance of close monitoring of blood pressure Due to mucositis and missed echo monitoring, I will omit doxorubicin the cycle and order echocardiogram for review We will proceed with treatment without delay I will see her in 2 weeks before her next Avastin treatment

## 2022-01-18 NOTE — Progress Notes (Signed)
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[facility]) Her last CT imaging showed stable disease control She tolerated recent treatment well without major side effects We discussed importance of close monitoring of blood pressure Due to mucositis and missed echo monitoring, I will omit doxorubicin the cycle and order echocardiogram for review We will proceed with treatment without delay I will see her in 2 weeks before her next Avastin treatment  Mucositis due to antineoplastic therapy She has intermittent mucositis with Doxil We will hold off treatment this cycle and order echocardiogram for review  Pancytopenia, acquired [facility]) She has pancytopenia due to recent treatment Observe closely for now  Elevated BP without diagnosis of hypertension Her blood pressure is elevated here but within normal range at home We will proceed with treatment without delay  Orders Placed This Encounter  Procedures   Total Protein, Urine dipstick    Standing Status:   Future    Standing Expiration Date:   02/02/2023   CBC with Differential (Cancer Center Only)    Standing Status:   Future    Standing Expiration Date:   02/02/2023   Total Protein, Urine dipstick    Standing Status:   Future    Standing Expiration Date:   02/23/2023   CBC with Differential ([facility] Only)    Standing Status:   Future    Standing Expiration Date:   02/23/2023   CMP ([facility] only)    Standing Status:   Future    Standing Expiration Date:   02/23/2023   Total Protein, Urine dipstick    Standing Status:   Future    Standing Expiration Date:   03/09/2023   CBC with Differential (Cancer Center Only)    Standing Status:   Future    Standing Expiration Date:   03/09/2023   ECHOCARDIOGRAM COMPLETE    Standing Status:   Future    Standing  Expiration Date:   01/18/2023    Order Specific Question:   Where should this test be performed    Answer:   [facility]    Order Specific Question:   Perflutren DEFINITY (image enhancing agent) should be administered unless hypersensitivity or allergy exist    Answer:   Administer Perflutren    Order Specific Question:   Reason for exam-Echo    Answer:   Chemo  Z09    All questions were answered. The patient knows to call the clinic with any problems, questions or concerns. The total time spent in the appointment was 30 minutes encounter with patients including review of chart and various tests results, discussions about plan of care and coordination of care plan   [name] Lark, MD 01/18/2022 8:11 AM  INTERVAL HISTORY: Please see below for problem oriented charting. she returns for treatment follow-up seen prior to cycle 5-day 1 She had some recent mucositis with treatment but not severe Her blood pressure at home were within normal range She denies abdominal pain or changes in bowel habits  REVIEW OF SYSTEMS:   Constitutional: Denies fevers, chills or abnormal weight loss Eyes: Denies blurriness of vision Respiratory: Denies cough, dyspnea or wheezes Cardiovascular: Denies palpitation, chest discomfort or lower extremity swelling Gastrointestinal:  Denies nausea, heartburn or change in bowel habits Skin: Denies abnormal skin rashes Lymphatics: Denies new lymphadenopathy or easy bruising Neurological:Denies numbness, tingling or  new weaknesses Behavioral/Psych: Mood is stable, no new changes  All other systems were reviewed with the patient and are negative.  I have reviewed the past medical history, past surgical history, social history and family history with the patient and they are unchanged from previous note.  ALLERGIES:  is allergic to skin adhesives [cyanoacrylate], shrimp [shellfish allergy], and sulfonamide derivatives.  MEDICATIONS:  Current Outpatient Medications   Medication Sig Dispense Refill   ALPRAZolam (XANAX) 0.25 MG tablet Take 1 tablet by mouth at bedtime as needed for anxiety. 30 tablet 0   carboxymethylcellulose (REFRESH PLUS) 0.5 % SOLN Place 1 drop into both eyes 2 (two) times daily as needed (dry eyes).     Cholecalciferol (VITAMIN D3) 25 MCG (1000 UT) CAPS Take 1,000 Units by mouth daily.      esomeprazole (NEXIUM) 20 MG capsule Take 20 mg by mouth daily.      estradiol (ESTRACE VAGINAL) 0.1 MG/GM vaginal cream Place 1 Applicatorful vaginally 3 (three) times a week. 42.5 g 12   loratadine (CLARITIN) 10 MG tablet Take 10 mg by mouth daily as needed for allergies.     Multiple Vitamin (MULTIVITAMIN WITH MINERALS) TABS tablet Take 1 tablet by mouth daily.     ondansetron (ZOFRAN) 8 MG tablet Take 1 tablet by mouth 2 times daily as needed. Start on the third day after chemotherapy. 30 tablet 1   prochlorperazine (COMPAZINE) 10 MG tablet TAKE 1 TABLET BY MOUTH EVERY 6 HOURS AS NEEDED FOR NAUSEA OR VOMITING 30 tablet 2   prochlorperazine (COMPAZINE) 10 MG tablet Take 1 tablet by mouth every 6 hours as needed (Nausea or vomiting). 30 tablet 1   No current facility-administered medications for this visit.    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 14.7 cm poorly defined soft tissue mass in central pelvis suspicious for primary ovarian carcinoma, with uterine leiomyosarcoma considered a less likely differential diagnosis. 2. Moderate ascites and diffuse peritoneal carcinomatosis. 3. Several small uterine fibroids.   11/13/2018 Tumor Marker   Patient's tumor was tested for the following markers: CA-125 Results of the tumor marker test revealed 1434   11/21/2018 Procedure   Successful ultrasound-guided diagnostic and therapeutic paracentesis yielding 3.1 liters of peritoneal fluid    11/21/2018 Pathology Results   PERITONEAL/ASCITIC FLUID(SPECIMEN 1 OF 1 COLLECTED 11/21/18): ADENOCARCINOMA. Specimen Clinical Information Pelvic mass suspicious for ovarian cancer Source Peritoneal/Ascitic Fluid, (specimen 1 of 1 collected 11/21/18) Gross Specimen: Received is/are 1000 ccs of dark amber fluid. (CM:cm) Prepared: # Smears: 0 # Concentration Technique Slides (i.e. 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   11/29/2018 Initial Diagnosis   Ovarian cancer ([place])   11/29/2018 Cancer Staging   Staging form: Ovary, Fallopian Tube, and Primary Peritoneal Carcinoma, AJCC 8th Edition - Clinical: cT3, cN0, cM0 - Signed by [name] Lark, MD on 11/29/2018   12/03/2018 Procedure   Successful ultrasound-guided therapeutic paracentesis yielding 3.7 liters of peritoneal fluid.     12/06/2018 Procedure   Placement of a subcutaneous port device. Catheter tip at the SVC and right atrium junction   12/14/2018 Procedure   Successful ultrasound-guided paracentesis yielding 2.9 L of peritoneal fluid   12/28/2018 Tumor Marker   Patient's tumor was tested for the following markers: CA-125 Results of the tumor marker test  revealed 812.   12/28/2018 - 04/22/2019 Chemotherapy   The patient had carboplatin and taxol for neoadjuvant treatment, followed by interval debulking surgery and subsequent adjuvant chemotherapy treatment.     02/01/2019 Imaging   Ct abdomen and pelvis 8.6 cm left ovarian mass, corresponding to the patient's known primary neoplasm, improved.   Mild peritoneal nodularity/omental caking, improved.   Small abdominopelvic ascites, improved.     02/01/2019 Tumor Marker   Patient's tumor was tested for the following markers: CA-125 Results of the tumor marker test revealed 183.   02/12/2019 Surgery   Preoperative Diagnosis: Stage IIIC ovarian cancer, s/p neoadjuvant chemotherapy       Procedure(s) Performed: 1. Exploratory laparotomy with total abdominal hysterectomy, bilateral salpingo-oophorectomy, omentectomy radical tumor debulking for ovarian cancer.   Surgeon: Thereasa Solo, MD.    Operative Findings: upper abdomen free of disease. No visible omental disease. Small volume (200cc) ascites. 8cm friable mass replacing left ovary and adherent to the sigmoid colon mesentery and ureter on the left.  Anterior fibroid. This represented an optimal cytoreduction (R0) with no gross visible disease remaining.    02/12/2019 Pathology Results   A. OVARY AND FALLOPIAN TUBE, LEFT, SALPINGO-OOPHORECTOMY: - Serous carcinoma, high grade, status post neoadjuvant therapy - See oncology table and comment below B. UTERUS CERVIX WITH RIGHT FALLOPIAN TUBE AND OVARY, HYSTERECTOMY: Uterus: - Serosal surface involved by serous carcinoma - Endomyometrium uninvolved by carcinoma - Benign endometrial polyp (4.1 cm) - Leiomyomata (5.5 cm; largest) - Adenomyosis Cervix: - Uninvolved by carcinoma Left ovary: - Serous carcinoma, high grade Left fallopian tube: - Serous carcinoma, high grade C. SOFT TISSUE, LEFT PELVIC SIDEWALL TUMOR, EXCISION: - Metastatic serous carcinoma, high-grade D. SOFT TISSUE, SIGMOID COLON MESENTERY, EXCISION: - Metastatic serous carcinoma, high-grade E. OMENTUM, TUMOR RESECTION: - Metastatic serous carcinoma, high-grade OVARY or FALLOPIAN TUBE or PRIMARY PERITONEUM: Procedure: Total hysterectomy and bilateral salpingo-oophorectomy, omentectomy and peritoneal biopsies Specimen Integrity: Fragmented Tumor Site: Left ovary and fallopian tube Ovarian Surface Involvement: Present Fallopian Tube Surface Involvement: Present Tumor Size: 6.3 cm (see comment) Histologic Type: Serous carcinoma Histologic Grade: High-grade Implants: Not applicable Other Tissue/ Organ Involvement: Right ovary, right fallopian tube, omentum, mesentery Largest Extrapelvic Peritoneal Focus:  Macroscopic Peritoneal/Ascitic Fluid: Malignant Treatment Effect: No definite or minimal response identified (chemotherapy response score 1 [CRS1] Regional Lymph Nodes: No lymph nodes submitted or found Pathologic Stage Classification (pTNM, AJCC 8th Edition): pT3c, pNX Representative Tumor Block: A4 Comment(s): Additional testing (HER-2, MMR and MSI) are pending. The primary tumor site appears to be the left ovary and fallopian tube. The uterus is only involved on the serosal surface. There is tumor on the anterior peritoneal reflection.  Addendum: Tumor is Her2 negative,MSI stable   02/19/2019 Tumor Marker   Patient's tumor was tested for the following markers: CA-125 Results of the tumor marker test revealed 40.3   04/01/2019 Tumor Marker   Patient's tumor was tested for the following markers: CA-125 Results of the tumor marker test revealed 8.1    Genetic Testing   Negative testing. No pathogenic variants identified on the [employer]. The report date is 04/17/2019.  Somatic genes analyzed through [facility]: ATM, BARD1, BRCA1, BRCA2, BRIP1, CHEK2, MRE11A, NBN, PALB2, RAD51C, RAD51D.  The CancerNext gene panel offered by [employer] includes sequencing and rearrangement analysis for the following 36 genes: APC*, ATM*, AXIN2, BARD1, BMPR1A, BRCA1*, BRCA2*, BRIP1*, CDH1*, CDK4, CDKN2A, CHEK2*, DICER1, MLH1*, MSH2*, MSH3, MSH6*, MUTYH*, NBN, NF1*, NTHL1, PALB2*, PMS2*, PTEN*, RAD51C*, RAD51D*, RECQL, SMAD4, SMARCA4,  STK11 and TP53* (sequencing and deletion/duplication); HOXB13, POLD1 and POLE (sequencing only); EPCAM and GREM1 (deletion/duplication only).    05/21/2019 Imaging   1. Interval hysterectomy, oophorectomy and omentectomy. No evidence of residual ovarian carcinoma in the pelvis. No evidence of peritoneal disease. No intraperitoneal free fluid. 2. Nonobstructing LEFT renal calculi.  Normal ureters.   05/22/2019 Tumor Marker   Patient's tumor was  tested for the following markers: CA-125 Results of the tumor marker test revealed 5.7.   06/03/2019 Procedure   Successful right IJ vein Port-A-Cath explant.   08/26/2019 Tumor Marker   Patient's tumor was tested for the following markers: CA-125. Results of the tumor marker test revealed 4.9   10/03/2019 Imaging   1. New tumor deposits along the capsular surfaces of the liver, spleen, in the left pelvis, and right paracolic gutter, compatible with metastatic disease. 2. Mild dilation of the dorsal pancreatic duct in the pancreatic body and head, cause uncertain. 3. Nonobstructive left nephrolithiasis. 4. Aortic atherosclerosis.   Aortic Atherosclerosis (ICD10-I70.0   10/10/2019 Procedure   Successful placement of a right IJ approach Power Port with ultrasound and fluoroscopic guidance. The catheter is ready for use.     10/11/2019 Tumor Marker   Patient's tumor was tested for the following markers: CA-125 Results of the tumor marker test revealed 19.6.   10/14/2019 -  Chemotherapy   The patient had carboplatin and gemzar for chemotherapy treatment.     11/04/2019 Tumor Marker   Patient's tumor was tested for the following markers: CA-125 Results of the tumor marker test revealed 11.4   11/28/2019 Tumor Marker   Patient's tumor was tested for the following markers: CA-125 Results of the tumor marker test revealed 5.8   12/20/2019 Imaging   1. Significant interval reduction in mixed solid and cystic nodule in the vicinity of the left ovary, with significant improvement or resolution of multiple pelvic, peritoneal, and organ capsule implants. Resolution of previously seen small volume ascites. Findings are consistent with treatment response of abdominal metastatic disease. 2. Status post hysterectomy, oophorectomy, and omentectomy. 3. Nonobstructive left nephrolithiasis. 4. Aortic Atherosclerosis (ICD10-I70.0).   12/23/2019 Tumor Marker   Patient's tumor was tested for the following  markers: CA-125 Results of the tumor marker test revealed 5.0   01/24/2020 Tumor Marker   Patient's tumor was tested for the following markers: CA-125 Results of the tumor marker test revealed 4.8   02/24/2020 Tumor Marker   Patient's tumor was tested for the following markers: CA-125. Results of the tumor marker test revealed 4.3.   03/06/2020 Imaging   1. Status post hysterectomy, bilateral oophorectomy, and omentectomy. 2. Resolution of previously described left adnexal nodularity. 3. No residual disease identified. 4.  Aortic Atherosclerosis (ICD10-I70.0). 5. Left nephrolithiasis.   04/20/2020 Tumor Marker   Patient's tumor was tested for the following markers: CA-125 Results of the tumor marker test revealed 4.6   05/18/2020 Tumor Marker   Patient's tumor was tested for the following markers: CA-125 Results of the tumor marker test revealed 4.7   05/29/2020 Imaging   Status post hysterectomy and bilateral salpingo oophorectomy.   No evidence of recurrent or metastatic disease.   06/08/2020 - 09/13/2021 Chemotherapy   She was on niraparib       06/17/2020 Tumor Marker   Patient's tumor was tested for the following markers: CA-125 Results of the tumor marker test revealed 4.5   08/04/2020 Tumor Marker   Patient's tumor was tested for the following markers: CA-125. Results of the tumor  marker test revealed 5.7   09/07/2020 Imaging   1. Status post hysterectomy and bilateral salpingo oophorectomy. No evidence of recurrent or metastatic disease. 2. Nonobstructing left nephrolithiasis. 3. Left-sided colonic diverticulosis without findings of acute diverticulitis. 4. Aortic atherosclerosis.     09/07/2020 Tumor Marker   Patient's tumor was tested for the following markers: CA-125 Results of the tumor marker test revealed 4.8.   10/21/2020 Tumor Marker   Patient's tumor was tested for the following markers: CA-125. Results of the tumor marker test revealed 5.1.   12/14/2020  Tumor Marker   Patient's tumor was tested for the following markers: CA-125. Results of the tumor marker test revealed 5.5.   04/05/2021 Tumor Marker   Patient's tumor was tested for the following markers: CA-125. Results of the tumor marker test revealed 5.9.   05/31/2021 Tumor Marker   Patient's tumor was tested for the following markers: CA-125. Results of the tumor marker test revealed 8.1.   09/13/2021 Imaging   1. Interval development of multiple new cystic and solid lesions in the abdomen and pelvis measuring up to 10.3 x 7.2 cm and consistent with peritoneal metastatic disease. 2. Nonobstructing left renal stones. 3. Aortic Atherosclerosis (ICD10-I70.0).     09/14/2021 Tumor Marker   Patient's tumor was tested for the following markers: CA-125. Results of the tumor marker test revealed 28.5.   09/16/2021 Echocardiogram   1. Left ventricular ejection fraction, by estimation, is 60 to 65%. The left ventricle has normal function. The left ventricle has no regional wall motion abnormalities. Left ventricular diastolic parameters are consistent with Grade I diastolic dysfunction (impaired relaxation).  2. Right ventricular systolic function is normal. The right ventricular size is normal.  3. The mitral valve is normal in structure. Trivial mitral valve regurgitation. No evidence of mitral stenosis.  4. The aortic valve has an indeterminant number of cusps. Aortic valve regurgitation is trivial. No aortic stenosis is present.  5. The inferior vena cava is normal in size with greater than 50% respiratory variability, suggesting right atrial pressure of 3 mmHg.     09/20/2021 - 01/17/2022 Chemotherapy   Patient is on Treatment Plan : OVARIAN RECURRENT Liposomal Doxorubicin + Carboplatin q28d X 6 Cycles     09/20/2021 -  Chemotherapy   Patient is on Treatment Plan : OVARIAN RECURRENT Liposomal Doxorubicin + Carboplatin q28d X 6 Cycles     09/23/2021 Tumor Marker   Patient's tumor was tested  for the following markers: CA-125. Results of the tumor marker test revealed 27.7.   11/24/2021 Tumor Marker   Patient's tumor was tested for the following markers: CA-125. Results of the tumor marker test revealed 28.4.   12/13/2021 Imaging   1. Overall stable peritoneal carcinomatosis. No new disease is demonstrated. 2. Moderate left-sided hydroureteronephrosis likely due to compression of the left ureter by the left pelvic mass. 3. Stable left-sided renal calculi. 4. Numerous left renal calculi.     12/22/2021 Tumor Marker   Patient's tumor was tested for the following markers: CA-125. Results of the tumor marker test revealed 24.7.     PHYSICAL EXAMINATION: ECOG PERFORMANCE STATUS: 1 - Symptomatic but completely ambulatory  Vitals:   01/17/22 0810  BP: (!) 165/93  Pulse: 73  Resp: 18  SpO2: 100%   Filed Weights   01/17/22 0810  Weight: 147 lb 9.6 oz (67 kg)    GENERAL:alert, no distress and comfortable NEURO: alert & oriented x 3 with fluent speech, no focal motor/sensory deficits  LABORATORY DATA:  I have reviewed the data as listed    Component Value Date/Time   NA 140 01/17/2022 0744   K 4.0 01/17/2022 0744   CL 108 01/17/2022 0744   CO2 26 01/17/2022 0744   GLUCOSE 102 (H) 01/17/2022 0744   BUN 9 01/17/2022 0744   CREATININE 0.42 (L) 01/17/2022 0744   CALCIUM 8.8 (L) 01/17/2022 0744   PROT 6.1 (L) 01/17/2022 0744   ALBUMIN 4.0 01/17/2022 0744   AST 60 (H) 01/17/2022 0744   ALT 41 01/17/2022 0744   ALKPHOS 101 01/17/2022 0744   BILITOT 1.0 01/17/2022 0744   GFRNONAA >60 01/17/2022 0744   GFRAA >60 02/24/2020 0852    No results found for: "SPEP", "UPEP"  Lab Results  Component Value Date   WBC 3.2 (L) 01/17/2022   NEUTROABS 1.8 01/17/2022   HGB 11.5 (L) 01/17/2022   HCT 32.0 (L) 01/17/2022   MCV 100.6 (H) 01/17/2022   PLT 167 01/17/2022      Chemistry      Component Value Date/Time   NA 140 01/17/2022 0744   K 4.0 01/17/2022 0744   CL 108  01/17/2022 0744   CO2 26 01/17/2022 0744   BUN 9 01/17/2022 0744   CREATININE 0.42 (L) 01/17/2022 0744      Component Value Date/Time   CALCIUM 8.8 (L) 01/17/2022 0744   ALKPHOS 101 01/17/2022 0744   AST 60 (H) 01/17/2022 0744   ALT 41 01/17/2022 0744   BILITOT 1.0 01/17/2022 0744

## 2022-01-18 NOTE — Assessment & Plan Note (Signed)
She has intermittent mucositis with Doxil We will hold off treatment this cycle and order echocardiogram for review

## 2022-01-18 NOTE — Assessment & Plan Note (Signed)
Her blood pressure is elevated here but within normal range at home We will proceed with treatment without delay

## 2022-01-18 NOTE — Progress Notes (Signed)
[facility] - Ovarian  No Change  Continue With Treatment as Ordered.  Original Decision Date/Time: 09/13/2021 15:10     A cycle is every 28 days:     Carboplatin      Liposomal doxorubicin   **Always confirm dose/schedule in your pharmacy ordering system**  Patient Characteristics: Recurrent or Progressive Disease, Third Line, Platinum Sensitive and ? 6 Months Since Last Platinum Therapy BRCA Mutation Status: Absent Therapeutic Status: Recurrent or Progressive Disease Line of Therapy: Third Line  Intent of Therapy: Non-Curative / Palliative Intent, Discussed with Patient

## 2022-01-18 NOTE — Assessment & Plan Note (Signed)
She has pancytopenia due to recent treatment Observe closely for now

## 2022-01-18 NOTE — Telephone Encounter (Signed)
Called and given CA-125 results. Reminded of echo appt next week. Told her per Dr. Alvy Bimler she will continue with Avastin and Carboplatin. Doxil will be dropped from the plan. She is agreeable and verbalized understanding.

## 2022-01-20 ENCOUNTER — Other Ambulatory Visit: Payer: Self-pay | Admitting: Hematology and Oncology

## 2022-01-20 ENCOUNTER — Telehealth: Payer: Self-pay

## 2022-01-20 DIAGNOSIS — J3489 Other specified disorders of nose and nasal sinuses: Secondary | ICD-10-CM | POA: Insufficient documentation

## 2022-01-20 NOTE — Telephone Encounter (Signed)
It is strange because we did not give her doxil recently I suggest ENT consult, if she agrees I will send referral

## 2022-01-20 NOTE — Telephone Encounter (Signed)
I created referral to Dr. Benjamine Mola Not sure if you need to fax

## 2022-01-20 NOTE — Telephone Encounter (Signed)
She called and left a message. She is still having sores in her nose. She is using nasal spray and neti pot.  She asking for other suggestions?

## 2022-01-20 NOTE — Telephone Encounter (Signed)
Faxed referral to Dr. Benjamine Mola at [phone]. Received fax confirmation.

## 2022-01-20 NOTE — Telephone Encounter (Signed)
She is agreeable to referral and will call the office back for concerns.

## 2022-01-25 ENCOUNTER — Other Ambulatory Visit: Payer: Self-pay

## 2022-01-26 ENCOUNTER — Ambulatory Visit (HOSPITAL_COMMUNITY)
Admission: RE | Admit: 2022-01-26 | Discharge: 2022-01-26 | Disposition: A | Discharge status: Home / Self Care | Payer: BC Managed Care – PPO | Source: Ambulatory Visit | Attending: Hematology and Oncology | Admitting: Hematology and Oncology

## 2022-01-26 DIAGNOSIS — Z01818 Encounter for other preprocedural examination: Secondary | ICD-10-CM | POA: Diagnosis not present

## 2022-01-26 DIAGNOSIS — C786 Secondary malignant neoplasm of retroperitoneum and peritoneum: Secondary | ICD-10-CM | POA: Insufficient documentation

## 2022-01-26 DIAGNOSIS — E785 Hyperlipidemia, unspecified: Secondary | ICD-10-CM | POA: Diagnosis not present

## 2022-01-26 DIAGNOSIS — R030 Elevated blood-pressure reading, without diagnosis of hypertension: Secondary | ICD-10-CM | POA: Insufficient documentation

## 2022-01-26 DIAGNOSIS — Z0189 Encounter for other specified special examinations: Secondary | ICD-10-CM

## 2022-01-26 DIAGNOSIS — I340 Nonrheumatic mitral (valve) insufficiency: Secondary | ICD-10-CM | POA: Insufficient documentation

## 2022-01-26 DIAGNOSIS — Z5181 Encounter for therapeutic drug level monitoring: Secondary | ICD-10-CM | POA: Insufficient documentation

## 2022-01-26 LAB — ECHOCARDIOGRAM COMPLETE
Area-P 1/2: 2.09 cm2
Calc EF: 52.3 %
S' Lateral: 2.6 cm
Single Plane A2C EF: 58.6 %
Single Plane A4C EF: 50.8 %

## 2022-01-26 NOTE — Progress Notes (Signed)
Echocardiogram 2D Echocardiogram has been performed.  Stacy Rivas 01/26/2022, 10:03 AM

## 2022-02-01 ENCOUNTER — Inpatient Hospital Stay (HOSPITAL_BASED_OUTPATIENT_CLINIC_OR_DEPARTMENT_OTHER): Payer: BC Managed Care – PPO | Admitting: Hematology and Oncology

## 2022-02-01 ENCOUNTER — Encounter: Payer: Self-pay | Admitting: Hematology and Oncology

## 2022-02-01 ENCOUNTER — Other Ambulatory Visit (HOSPITAL_COMMUNITY): Payer: Self-pay

## 2022-02-01 ENCOUNTER — Inpatient Hospital Stay: Payer: BC Managed Care – PPO

## 2022-02-01 ENCOUNTER — Inpatient Hospital Stay: Payer: BC Managed Care – PPO | Attending: Gynecologic Oncology

## 2022-02-01 ENCOUNTER — Other Ambulatory Visit: Payer: Self-pay | Admitting: Hematology and Oncology

## 2022-02-01 VITALS — BP 178/95 | HR 73 | Resp 17

## 2022-02-01 VITALS — BP 173/100 | HR 87 | Temp 98.1°F | Resp 18 | Ht 62.0 in | Wt 145.9 lb

## 2022-02-01 DIAGNOSIS — Z5112 Encounter for antineoplastic immunotherapy: Secondary | ICD-10-CM | POA: Diagnosis not present

## 2022-02-01 DIAGNOSIS — Z7189 Other specified counseling: Secondary | ICD-10-CM

## 2022-02-01 DIAGNOSIS — C786 Secondary malignant neoplasm of retroperitoneum and peritoneum: Secondary | ICD-10-CM | POA: Insufficient documentation

## 2022-02-01 DIAGNOSIS — I158 Other secondary hypertension: Secondary | ICD-10-CM

## 2022-02-01 DIAGNOSIS — T50905A Adverse effect of unspecified drugs, medicaments and biological substances, initial encounter: Secondary | ICD-10-CM | POA: Diagnosis not present

## 2022-02-01 DIAGNOSIS — C562 Malignant neoplasm of left ovary: Secondary | ICD-10-CM | POA: Insufficient documentation

## 2022-02-01 DIAGNOSIS — C7989 Secondary malignant neoplasm of other specified sites: Secondary | ICD-10-CM | POA: Insufficient documentation

## 2022-02-01 DIAGNOSIS — Z79899 Other long term (current) drug therapy: Secondary | ICD-10-CM | POA: Insufficient documentation

## 2022-02-01 LAB — TOTAL PROTEIN, URINE DIPSTICK: Protein, ur: NEGATIVE mg/dL

## 2022-02-01 MED ORDER — LISINOPRIL 10 MG PO TABS
10.0000 mg | ORAL_TABLET | Freq: Every day | ORAL | 1 refills | Status: DC
Start: 2022-02-01 — End: 2022-03-08
  Filled 2022-02-01 (×2): qty 30, 30d supply, fill #0

## 2022-02-01 MED ORDER — HEPARIN SOD (PORK) LOCK FLUSH 100 UNIT/ML IV SOLN
500.0000 [IU] | Freq: Once | INTRAVENOUS | Status: AC | PRN
  Administered 2022-02-01: 500 [IU]

## 2022-02-01 MED ORDER — SODIUM CHLORIDE 0.9 % IV SOLN
10.0000 mg/kg | Freq: Once | INTRAVENOUS | Status: AC
  Administered 2022-02-01: 700 mg via INTRAVENOUS
  Filled 2022-02-01: qty 16

## 2022-02-01 MED ORDER — SODIUM CHLORIDE 0.9% FLUSH
10.0000 mL | INTRAVENOUS | Status: DC | PRN
  Administered 2022-02-01: 10 mL

## 2022-02-01 MED ORDER — SODIUM CHLORIDE 0.9 % IV SOLN: Freq: Once | INTRAVENOUS | Status: AC

## 2022-02-01 NOTE — Progress Notes (Signed)
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[facility]) Her recent echocardiogram is normal but showed significant elevated blood pressure Her documented blood pressure from home has been intermittently elevated She has not been feeling well when her blood pressure is high I recommend starting her on low-dose lisinopril I recommend the patient to continue checking her blood pressure twice daily and we will call her at the end of the week for assessment We will proceed with bevacizumab today  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   All questions were answered. The patient knows to call the clinic with any problems, questions or concerns. The total time spent in the appointment was 30 minutes encounter with patients including review of chart and various tests results, discussions about plan of care and coordination of care plan   [name] Lark, MD 02/01/2022 9:08 AM  INTERVAL HISTORY: Please see below for problem oriented charting. she returns for treatment follow-up and review of echocardiogram result The patient noted elevated blood pressure with systolic in the 166A especially in the evening time On the day of echocardiogram, her blood pressure was elevated with diastolic over 630 She did note that there is a component of anxiety but in general, her blood pressure has been high especially in the evening  but reasonably normal in the morning She felt uncomfortable when her blood pressure is high Her mucositis is resolving She denies recent changes in vision or headaches No evidence of leg swelling  REVIEW OF SYSTEMS:   Constitutional: Denies fevers, chills or abnormal weight loss Eyes: Denies blurriness of vision Respiratory: Denies cough, dyspnea or wheezes Cardiovascular: Denies palpitation, chest discomfort or lower extremity swelling Gastrointestinal:  Denies nausea, heartburn or change in bowel habits Skin: Denies abnormal skin rashes Lymphatics: Denies new lymphadenopathy or easy bruising Neurological:Denies numbness, tingling or new weaknesses Behavioral/Psych: Mood is stable, no new changes  All other systems were reviewed with the patient and are negative.  I have reviewed the past medical history, past surgical history, social history and family history with the patient and they are unchanged from previous note.  ALLERGIES:  is allergic to skin adhesives [cyanoacrylate], shrimp [shellfish allergy], and sulfonamide derivatives.  MEDICATIONS:  Current Outpatient Medications  Medication Sig Dispense Refill   lisinopril (ZESTRIL) 10 MG tablet Take 1 tablet (10 mg total) by mouth daily. 30 tablet 1   ALPRAZolam (XANAX) 0.25 MG tablet Take 1 tablet by mouth at bedtime as needed for anxiety. 30 tablet 0   carboxymethylcellulose (REFRESH PLUS) 0.5 % SOLN Place 1 drop into both eyes 2 (two) times daily as needed (dry eyes).     Cholecalciferol (VITAMIN D3) 25 MCG (1000 UT) CAPS Take 1,000 Units by mouth daily.      esomeprazole (NEXIUM) 20 MG capsule Take 20 mg by mouth daily.      estradiol (ESTRACE VAGINAL) 0.1 MG/GM vaginal cream Place 1 Applicatorful vaginally 3 (three) times  a week. 42.5 g 12   loratadine (CLARITIN) 10 MG tablet Take 10 mg by mouth daily as needed for allergies.     Multiple Vitamin (MULTIVITAMIN WITH MINERALS) TABS tablet Take 1 tablet by mouth daily.      ondansetron (ZOFRAN) 8 MG tablet Take 1 tablet by mouth 2 times daily as needed. Start on the third day after chemotherapy. 30 tablet 1   prochlorperazine (COMPAZINE) 10 MG tablet Take 1 tablet by mouth every 6 hours as needed (Nausea or vomiting). 30 tablet 1   No current facility-administered medications for this visit.   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 14.7 cm poorly defined soft tissue mass in central pelvis suspicious for primary ovarian carcinoma, with uterine leiomyosarcoma considered a less likely differential diagnosis. 2. Moderate ascites and diffuse peritoneal carcinomatosis. 3. Several small uterine fibroids.   11/13/2018 Tumor Marker   Patient's tumor was tested for the following markers: CA-125 Results of the tumor marker test revealed 1434   11/21/2018 Procedure   Successful ultrasound-guided diagnostic and therapeutic paracentesis yielding 3.1 liters of peritoneal fluid   11/21/2018 Pathology Results   PERITONEAL/ASCITIC FLUID(SPECIMEN 1 OF 1 COLLECTED 11/21/18): ADENOCARCINOMA. Specimen Clinical Information Pelvic mass suspicious for ovarian cancer Source Peritoneal/Ascitic Fluid, (specimen 1 of 1 collected 11/21/18) Gross Specimen: Received is/are 1000 ccs of dark amber fluid. (CM:cm) Prepared: # Smears: 0 # Concentration Technique Slides (i.e.  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   11/29/2018 Initial Diagnosis   Ovarian cancer ([place])   11/29/2018 Cancer Staging   Staging form: Ovary, Fallopian Tube, and Primary Peritoneal Carcinoma, AJCC 8th Edition - Clinical: cT3, cN0, cM0 - Signed by [name] Lark, MD on 11/29/2018   12/03/2018 Procedure   Successful ultrasound-guided therapeutic paracentesis yielding 3.7 liters of peritoneal fluid.     12/06/2018 Procedure   Placement of a subcutaneous port device. Catheter tip at the SVC and right atrium junction   12/14/2018 Procedure   Successful ultrasound-guided paracentesis yielding 2.9 L of peritoneal fluid   12/28/2018 Tumor Marker   Patient's tumor was tested for the following markers: CA-125 Results of the tumor marker test revealed 812.   12/28/2018 - 04/22/2019 Chemotherapy   The patient had carboplatin and taxol for neoadjuvant treatment, followed by interval debulking surgery and subsequent adjuvant chemotherapy treatment.     02/01/2019 Imaging   Ct abdomen and pelvis 8.6 cm left ovarian mass, corresponding to the patient's known primary neoplasm, improved.   Mild peritoneal nodularity/omental caking, improved.   Small abdominopelvic ascites, improved.     02/01/2019 Tumor Marker   Patient's tumor was tested for the following markers: CA-125 Results of the tumor marker test revealed 183.   02/12/2019 Surgery   Preoperative Diagnosis: Stage  IIIC ovarian cancer, s/p neoadjuvant chemotherapy      Procedure(s) Performed: 1. Exploratory laparotomy with total abdominal hysterectomy, bilateral salpingo-oophorectomy, omentectomy radical tumor debulking for ovarian cancer.   Surgeon: Thereasa Solo, MD.    Operative Findings: upper abdomen free of disease. No visible omental disease. Small volume (200cc) ascites. 8cm friable mass replacing left ovary and adherent to the sigmoid colon  mesentery and ureter on the left.  Anterior fibroid. This represented an optimal cytoreduction (R0) with no gross visible disease remaining.    02/12/2019 Pathology Results   A. OVARY AND FALLOPIAN TUBE, LEFT, SALPINGO-OOPHORECTOMY: - Serous carcinoma, high grade, status post neoadjuvant therapy - See oncology table and comment below B. UTERUS CERVIX WITH RIGHT FALLOPIAN TUBE AND OVARY, HYSTERECTOMY: Uterus: - Serosal surface involved by serous carcinoma - Endomyometrium uninvolved by carcinoma - Benign endometrial polyp (4.1 cm) - Leiomyomata (5.5 cm; largest) - Adenomyosis Cervix: - Uninvolved by carcinoma Left ovary: - Serous carcinoma, high grade Left fallopian tube: - Serous carcinoma, high grade C. SOFT TISSUE, LEFT PELVIC SIDEWALL TUMOR, EXCISION: - Metastatic serous carcinoma, high-grade D. SOFT TISSUE, SIGMOID COLON MESENTERY, EXCISION: - Metastatic serous carcinoma, high-grade E. OMENTUM, TUMOR RESECTION: - Metastatic serous carcinoma, high-grade OVARY or FALLOPIAN TUBE or PRIMARY PERITONEUM: Procedure: Total hysterectomy and bilateral salpingo-oophorectomy, omentectomy and peritoneal biopsies Specimen Integrity: Fragmented Tumor Site: Left ovary and fallopian tube Ovarian Surface Involvement: Present Fallopian Tube Surface Involvement: Present Tumor Size: 6.3 cm (see comment) Histologic Type: Serous carcinoma Histologic Grade: High-grade Implants: Not applicable Other Tissue/ Organ Involvement: Right ovary, right fallopian tube, omentum, mesentery Largest Extrapelvic Peritoneal Focus: Macroscopic Peritoneal/Ascitic Fluid: Malignant Treatment Effect: No definite or minimal response identified (chemotherapy response score 1 [CRS1] Regional Lymph Nodes: No lymph nodes submitted or found Pathologic Stage Classification (pTNM, AJCC 8th Edition): pT3c, pNX Representative Tumor Block: A4 Comment(s): Additional testing (HER-2, MMR and MSI) are pending. The primary tumor  site appears to be the left ovary and fallopian tube. The uterus is only involved on the serosal surface. There is tumor on the anterior peritoneal reflection.  Addendum: Tumor is Her2 negative,MSI stable   02/19/2019 Tumor Marker   Patient's tumor was tested for the following markers: CA-125 Results of the tumor marker test revealed 40.3   04/01/2019 Tumor Marker   Patient's tumor was tested for the following markers: CA-125 Results of the tumor marker test revealed 8.1    Genetic Testing   Negative testing. No pathogenic variants identified on the [employer]. The report date is 04/17/2019.  Somatic genes analyzed through [facility]: ATM, BARD1, BRCA1, BRCA2, BRIP1, CHEK2, MRE11A, NBN, PALB2, RAD51C, RAD51D.  The CancerNext gene panel offered by [employer] includes sequencing and rearrangement analysis for the following 36 genes: APC*, ATM*, AXIN2, BARD1, BMPR1A, BRCA1*, BRCA2*, BRIP1*, CDH1*, CDK4, CDKN2A, CHEK2*, DICER1, MLH1*, MSH2*, MSH3, MSH6*, MUTYH*, NBN, NF1*, NTHL1, PALB2*, PMS2*, PTEN*, RAD51C*, RAD51D*, RECQL, SMAD4, SMARCA4, STK11 and TP53* (sequencing and deletion/duplication); HOXB13, POLD1 and POLE (sequencing only); EPCAM and GREM1 (deletion/duplication only).    05/21/2019 Imaging   1. Interval hysterectomy, oophorectomy and omentectomy. No evidence of residual ovarian carcinoma in the pelvis. No evidence of peritoneal disease. No intraperitoneal free fluid. 2. Nonobstructing LEFT renal calculi.  Normal ureters.   05/22/2019 Tumor Marker   Patient's tumor was tested for the following markers: CA-125 Results of the tumor marker test revealed 5.7.   06/03/2019 Procedure   Successful right IJ vein Port-A-Cath explant.   08/26/2019 Tumor Marker   Patient's tumor was  tested for the following markers: CA-125. Results of the tumor marker test revealed 4.9   10/03/2019 Imaging   1. New tumor deposits along the capsular surfaces of the liver,  spleen, in the left pelvis, and right paracolic gutter, compatible with metastatic disease. 2. Mild dilation of the dorsal pancreatic duct in the pancreatic body and head, cause uncertain. 3. Nonobstructive left nephrolithiasis. 4. Aortic atherosclerosis.   Aortic Atherosclerosis (ICD10-I70.0   10/10/2019 Procedure   Successful placement of a right IJ approach Power Port with ultrasound and fluoroscopic guidance. The catheter is ready for use.     10/11/2019 Tumor Marker   Patient's tumor was tested for the following markers: CA-125 Results of the tumor marker test revealed 19.6.   10/14/2019 -  Chemotherapy   The patient had carboplatin and gemzar for chemotherapy treatment.     11/04/2019 Tumor Marker   Patient's tumor was tested for the following markers: CA-125 Results of the tumor marker test revealed 11.4   11/28/2019 Tumor Marker   Patient's tumor was tested for the following markers: CA-125 Results of the tumor marker test revealed 5.8   12/20/2019 Imaging   1. Significant interval reduction in mixed solid and cystic nodule in the vicinity of the left ovary, with significant improvement or resolution of multiple pelvic, peritoneal, and organ capsule implants. Resolution of previously seen small volume ascites. Findings are consistent with treatment response of abdominal metastatic disease. 2. Status post hysterectomy, oophorectomy, and omentectomy. 3. Nonobstructive left nephrolithiasis. 4. Aortic Atherosclerosis (ICD10-I70.0).   12/23/2019 Tumor Marker   Patient's tumor was tested for the following markers: CA-125 Results of the tumor marker test revealed 5.0   01/24/2020 Tumor Marker   Patient's tumor was tested for the following markers: CA-125 Results of the tumor marker test revealed 4.8   02/24/2020 Tumor Marker   Patient's tumor was tested for the following markers: CA-125. Results of the tumor marker test revealed 4.3.   03/06/2020 Imaging   1. Status post  hysterectomy, bilateral oophorectomy, and omentectomy. 2. Resolution of previously described left adnexal nodularity. 3. No residual disease identified. 4.  Aortic Atherosclerosis (ICD10-I70.0). 5. Left nephrolithiasis.   04/20/2020 Tumor Marker   Patient's tumor was tested for the following markers: CA-125 Results of the tumor marker test revealed 4.6   05/18/2020 Tumor Marker   Patient's tumor was tested for the following markers: CA-125 Results of the tumor marker test revealed 4.7   05/29/2020 Imaging   Status post hysterectomy and bilateral salpingo oophorectomy.   No evidence of recurrent or metastatic disease.   06/08/2020 - 09/13/2021 Chemotherapy   She was on niraparib       06/17/2020 Tumor Marker   Patient's tumor was tested for the following markers: CA-125 Results of the tumor marker test revealed 4.5   08/04/2020 Tumor Marker   Patient's tumor was tested for the following markers: CA-125. Results of the tumor marker test revealed 5.7   09/07/2020 Imaging   1. Status post hysterectomy and bilateral salpingo oophorectomy. No evidence of recurrent or metastatic disease. 2. Nonobstructing left nephrolithiasis. 3. Left-sided colonic diverticulosis without findings of acute diverticulitis. 4. Aortic atherosclerosis.     09/07/2020 Tumor Marker   Patient's tumor was tested for the following markers: CA-125 Results of the tumor marker test revealed 4.8.   10/21/2020 Tumor Marker   Patient's tumor was tested for the following markers: CA-125. Results of the tumor marker test revealed 5.1.   12/14/2020 Tumor Marker   Patient's tumor was tested  for the following markers: CA-125. Results of the tumor marker test revealed 5.5.   04/05/2021 Tumor Marker   Patient's tumor was tested for the following markers: CA-125. Results of the tumor marker test revealed 5.9.   05/31/2021 Tumor Marker   Patient's tumor was tested for the following markers: CA-125. Results of the tumor  marker test revealed 8.1.   09/13/2021 Imaging   1. Interval development of multiple new cystic and solid lesions in the abdomen and pelvis measuring up to 10.3 x 7.2 cm and consistent with peritoneal metastatic disease. 2. Nonobstructing left renal stones. 3. Aortic Atherosclerosis (ICD10-I70.0).     09/14/2021 Tumor Marker   Patient's tumor was tested for the following markers: CA-125. Results of the tumor marker test revealed 28.5.   09/16/2021 Echocardiogram   1. Left ventricular ejection fraction, by estimation, is 60 to 65%. The left ventricle has normal function. The left ventricle has no regional wall motion abnormalities. Left ventricular diastolic parameters are consistent with Grade I diastolic dysfunction (impaired relaxation).  2. Right ventricular systolic function is normal. The right ventricular size is normal.  3. The mitral valve is normal in structure. Trivial mitral valve regurgitation. No evidence of mitral stenosis.  4. The aortic valve has an indeterminant number of cusps. Aortic valve regurgitation is trivial. No aortic stenosis is present.  5. The inferior vena cava is normal in size with greater than 50% respiratory variability, suggesting right atrial pressure of 3 mmHg.     09/20/2021 - 01/17/2022 Chemotherapy   Patient is on Treatment Plan : OVARIAN RECURRENT Liposomal Doxorubicin + Carboplatin q28d X 6 Cycles     09/20/2021 -  Chemotherapy   Patient is on Treatment Plan : OVARIAN RECURRENT Liposomal Doxorubicin + Carboplatin q28d X 6 Cycles     09/23/2021 Tumor Marker   Patient's tumor was tested for the following markers: CA-125. Results of the tumor marker test revealed 27.7.   11/24/2021 Tumor Marker   Patient's tumor was tested for the following markers: CA-125. Results of the tumor marker test revealed 28.4.   12/13/2021 Imaging   1. Overall stable peritoneal carcinomatosis. No new disease is demonstrated. 2. Moderate left-sided hydroureteronephrosis likely  due to compression of the left ureter by the left pelvic mass. 3. Stable left-sided renal calculi. 4. Numerous left renal calculi.     12/22/2021 Tumor Marker   Patient's tumor was tested for the following markers: CA-125. Results of the tumor marker test revealed 24.7.   01/18/2022 Tumor Marker   Patient's tumor was tested for the following markers: CA-125. Results of the tumor marker test revealed 9.9.   01/27/2022 Echocardiogram    1. Left ventricular ejection fraction, by estimation, is 60 to 65%. The left ventricle has normal function. The left ventricle has no regional wall motion abnormalities. Left ventricular diastolic parameters are consistent with Grade I diastolic dysfunction (impaired relaxation). The average left ventricular global longitudinal strain is -21.1 %. The global longitudinal strain is normal.  2. Right ventricular systolic function is normal. The right ventricular size is normal.  3. The mitral valve is abnormal. Mild mitral valve regurgitation.  4. The aortic valve is tricuspid. Aortic valve regurgitation is trivial.  5. The inferior vena cava is normal in size with greater than 50% respiratory variability, suggesting right atrial pressure of 3 mmHg.     PHYSICAL EXAMINATION: ECOG PERFORMANCE STATUS: 1 - Symptomatic but completely ambulatory  Vitals:   02/01/22 0828  BP: (!) 173/100  Pulse: 87  Resp:  18  Temp: 98.1 F (36.7 C)  SpO2: 99%   Filed Weights   02/01/22 0828  Weight: 145 lb 14.4 oz (66.2 kg)    GENERAL:alert, no distress and comfortable NEURO: alert & oriented x 3 with fluent speech, no focal motor/sensory deficits  LABORATORY DATA:  I have reviewed the data as listed    Component Value Date/Time   NA 140 01/17/2022 0744   K 4.0 01/17/2022 0744   CL 108 01/17/2022 0744   CO2 26 01/17/2022 0744   GLUCOSE 102 (H) 01/17/2022 0744   BUN 9 01/17/2022 0744   CREATININE 0.42 (L) 01/17/2022 0744   CALCIUM 8.8 (L) 01/17/2022 0744   PROT  6.1 (L) 01/17/2022 0744   ALBUMIN 4.0 01/17/2022 0744   AST 60 (H) 01/17/2022 0744   ALT 41 01/17/2022 0744   ALKPHOS 101 01/17/2022 0744   BILITOT 1.0 01/17/2022 0744   GFRNONAA >60 01/17/2022 0744   GFRAA >60 02/24/2020 0852    No results found for: "SPEP", "UPEP"  Lab Results  Component Value Date   WBC 3.2 (L) 01/17/2022   NEUTROABS 1.8 01/17/2022   HGB 11.5 (L) 01/17/2022   HCT 32.0 (L) 01/17/2022   MCV 100.6 (H) 01/17/2022   PLT 167 01/17/2022      Chemistry      Component Value Date/Time   NA 140 01/17/2022 0744   K 4.0 01/17/2022 0744   CL 108 01/17/2022 0744   CO2 26 01/17/2022 0744   BUN 9 01/17/2022 0744   CREATININE 0.42 (L) 01/17/2022 0744      Component Value Date/Time   CALCIUM 8.8 (L) 01/17/2022 0744   ALKPHOS 101 01/17/2022 0744   AST 60 (H) 01/17/2022 0744   ALT 41 01/17/2022 0744   BILITOT 1.0 01/17/2022 0744       RADIOGRAPHIC STUDIES: I have personally reviewed the radiological images as listed and agreed with the findings in the report. ECHOCARDIOGRAM COMPLETE  Result Date: 01/26/2022    ECHOCARDIOGRAM REPORT   Patient Name:   AMIRACLE NEISES Date of Exam: 01/26/2022 Medical Rec #:  [id number]        Height:       62.0 in Accession #:    [id number]       Weight:       147.6 lb Date of Birth:  Jul 31, 1955        BSA:          1.680 m Patient Age:    20 years         BP:           162/103 mmHg Patient Gender: F                HR:           74 bpm. Exam Location:  Outpatient Procedure: 2D Echo, Cardiac Doppler, Color Doppler and Strain Analysis Indications:    Chemo Z09  History:        Patient has prior history of Echocardiogram examinations, most                 recent 09/15/2021. Risk Factors:Dyslipidemia.  Sonographer:    Bernadene Person RDCS Referring Phys: [id number] Tifini Reeder [place]  1. Left ventricular ejection fraction, by estimation, is 60 to 65%. The left ventricle has normal function. The left ventricle has no regional wall motion  abnormalities. Left ventricular diastolic parameters are consistent with Grade I diastolic dysfunction (impaired relaxation). The average left ventricular global longitudinal strain  is -21.1 %. The global longitudinal strain is normal.  2. Right ventricular systolic function is normal. The right ventricular size is normal.  3. The mitral valve is abnormal. Mild mitral valve regurgitation.  4. The aortic valve is tricuspid. Aortic valve regurgitation is trivial.  5. The inferior vena cava is normal in size with greater than 50% respiratory variability, suggesting right atrial pressure of 3 mmHg. Comparison(s): No significant change from prior study. 09/15/2021: LVEF 60-65%, grade 1 DD, trivial AI. FINDINGS  Left Ventricle: Left ventricular ejection fraction, by estimation, is 60 to 65%. The left ventricle has normal function. The left ventricle has no regional wall motion abnormalities. The average left ventricular global longitudinal strain is -21.1 %. The global longitudinal strain is normal. The left ventricular internal cavity size was normal in size. There is no left ventricular hypertrophy. Left ventricular diastolic parameters are consistent with Grade I diastolic dysfunction (impaired relaxation). Indeterminate filling pressures. Right Ventricle: The right ventricular size is normal. No increase in right ventricular wall thickness. Right ventricular systolic function is normal. Left Atrium: Left atrial size was normal in size. Right Atrium: Right atrial size was normal in size. Pericardium: There is no evidence of pericardial effusion. Mitral Valve: The mitral valve is abnormal. There is mild thickening of the anterior and posterior mitral valve leaflet(s). Mild mitral valve regurgitation. Tricuspid Valve: The tricuspid valve is grossly normal. Tricuspid valve regurgitation is trivial. Aortic Valve: The aortic valve is tricuspid. Aortic valve regurgitation is trivial. Pulmonic Valve: The pulmonic valve was  normal in structure. Pulmonic valve regurgitation is not visualized. Aorta: The aortic root and ascending aorta are structurally normal, with no evidence of dilitation. Venous: The inferior vena cava is normal in size with greater than 50% respiratory variability, suggesting right atrial pressure of 3 mmHg. IAS/Shunts: The interatrial septum appears to be lipomatous. No atrial level shunt detected by color flow Doppler.  LEFT VENTRICLE PLAX 2D LVIDd:         4.30 cm     Diastology LVIDs:         2.60 cm     LV e' medial:    4.73 cm/s LV PW:         1.00 cm     LV E/e' medial:  11.2 LV IVS:        1.10 cm     LV e' lateral:   5.22 cm/s LVOT diam:     2.00 cm     LV E/e' lateral: 10.2 LV SV:         59 LV SV Index:   35          2D Longitudinal Strain LVOT Area:     3.14 cm    2D Strain GLS (A2C):   -21.5 %                            2D Strain GLS (A3C):   -19.7 %                            2D Strain GLS (A4C):   -22.2 % LV Volumes (MOD)           2D Strain GLS Avg:     -21.1 % LV vol d, MOD A2C: 88.0 ml LV vol d, MOD A4C: 80.9 ml LV vol s, MOD A2C: 36.4 ml LV vol s, MOD A4C: 39.8 ml LV SV  MOD A2C:     51.6 ml LV SV MOD A4C:     80.9 ml LV SV MOD BP:      44.6 ml RIGHT VENTRICLE RV S prime:     14.80 cm/s TAPSE (M-mode): 2.5 cm LEFT ATRIUM             Index        RIGHT ATRIUM           Index LA diam:        3.90 cm 2.32 cm/m   RA Area:     14.50 cm LA Vol (A2C):   35.1 ml 20.89 ml/m  RA Volume:   32.90 ml  19.58 ml/m LA Vol (A4C):   30.0 ml 17.86 ml/m LA Biplane Vol: 33.7 ml 20.06 ml/m  AORTIC VALVE LVOT Vmax:   83.60 cm/s LVOT Vmean:  55.700 cm/s LVOT VTI:    0.189 m  AORTA Ao Root diam: 3.60 cm Ao Asc diam:  3.10 cm MITRAL VALVE MV Area (PHT): 2.09 cm    SHUNTS MV Decel Time: 363 msec    Systemic VTI:  0.19 m MV E velocity: 53.10 cm/s  Systemic Diam: 2.00 cm MV A velocity: 62.60 cm/s MV E/A ratio:  0.85 Lyman Bishop MD Electronically signed by Lyman Bishop MD Signature Date/Time: 01/26/2022/3:34:35 PM     Final

## 2022-02-01 NOTE — Assessment & Plan Note (Signed)
Her recent echocardiogram is normal but showed significant elevated blood pressure Her documented blood pressure from home has been intermittently elevated She has not been feeling well when her blood pressure is high I recommend starting her on low-dose lisinopril I recommend the patient to continue checking her blood pressure twice daily and we will call her at the end of the week for assessment We will proceed with bevacizumab today

## 2022-02-01 NOTE — Patient Instructions (Signed)
[facility]  Discharge Instructions: Thank you for choosing [facility] to provide your oncology and hematology care.   If you have a lab appointment with the Cancer Center, please go directly to the Cancer Center and check in at the registration area.   Wear comfortable clothing and clothing appropriate for easy access to any Portacath or PICC line.   We strive to give you quality time with your provider. You may need to reschedule your appointment if you arrive late (15 or more minutes).  Arriving late affects you and other patients whose appointments are after yours.  Also, if you miss three or more appointments without notifying the office, you may be dismissed from the clinic at the provider's discretion.      For prescription refill requests, have your pharmacy contact our office and allow 72 hours for refills to be completed.    Today you received the following chemotherapy and/or immunotherapy agents bevacizumab      To help prevent nausea and vomiting after your treatment, we encourage you to take your nausea medication as directed.  BELOW ARE SYMPTOMS THAT SHOULD BE REPORTED IMMEDIATELY: *FEVER GREATER THAN 100.4 F (38 C) OR HIGHER *CHILLS OR SWEATING *NAUSEA AND VOMITING THAT IS NOT CONTROLLED WITH YOUR NAUSEA MEDICATION *UNUSUAL SHORTNESS OF BREATH *UNUSUAL BRUISING OR BLEEDING *URINARY PROBLEMS (pain or burning when urinating, or frequent urination) *BOWEL PROBLEMS (unusual diarrhea, constipation, pain near the anus) TENDERNESS IN MOUTH AND THROAT WITH OR WITHOUT PRESENCE OF ULCERS (sore throat, sores in mouth, or a toothache) UNUSUAL RASH, SWELLING OR PAIN  UNUSUAL VAGINAL DISCHARGE OR ITCHING   Items with * indicate a potential emergency and should be followed up as soon as possible or go to the Emergency Department if any problems should occur.  Please show the CHEMOTHERAPY ALERT CARD or IMMUNOTHERAPY ALERT CARD at check-in to  the Emergency Department and triage nurse.  Should you have questions after your visit or need to cancel or reschedule your appointment, please contact [facility]  Dept: [phone]  and follow the prompts.  Office hours are 8:00 a.m. to 4:30 p.m. Monday - Friday. Please note that voicemails left after 4:00 p.m. may not be returned until the following business day.  We are closed weekends and major holidays. You have access to a nurse at all times for urgent questions. Please call the main number to the clinic Dept: [phone] and follow the prompts.   For any non-urgent questions, you may also contact your provider using MyChart. We now offer e-Visits for anyone 18 and older to request care online for non-urgent symptoms. For details visit [email or web address].   Also download the MyChart app! Go to the app store, search "MyChart", open the app, select [facility], and log in with your MyChart username and password.  Masks are optional in the cancer centers. If you would like for your care team to wear a mask while they are taking care of you, please let them know. You may have one support person who is at least 68 years old accompany you for your appointments. 

## 2022-02-01 NOTE — Assessment & Plan Note (Signed)
I shared with her that elevated blood pressure is a common finding secondary to bevacizumab There is a component of anxiety today Thankfully, urine protein showed no evidence of proteinuria I recommend starting her on low-dose lisinopril We will call her in a few days to check on her blood pressure control I recommend the patient to check her blood pressure twice daily

## 2022-02-04 ENCOUNTER — Telehealth: Payer: Self-pay

## 2022-02-04 NOTE — Telephone Encounter (Signed)
-----   Message from [name] Lark, MD sent at 02/04/2022  8:00 AM EDT ----- Can you call and ask how her BP is doing?

## 2022-02-04 NOTE — Telephone Encounter (Signed)
Called to get BP readings.  Today am bp 152/88, HR 60 9/14 am bp 157/93, HR 60, 5 minutes later bp 157/89, HR 59, 9/14 pm bp 143/84, HR 60 9/13 am bp 153/91. HR 63, 5 mins later bp 149/93, HR 57 9/13 pm bp 134/81, HR 65 9/12 pm bp 157/95 She is currently taking 1/2 Lisinopril tab daily.

## 2022-02-04 NOTE — Telephone Encounter (Signed)
OK, thanks, based on that, I recommend she takes a full dose It is best to take full dose (1 whole pill) once a day for her in the morning

## 2022-02-04 NOTE — Telephone Encounter (Signed)
Called and given below message. She verbalized understanding. 

## 2022-02-21 ENCOUNTER — Other Ambulatory Visit: Payer: Self-pay

## 2022-02-21 MED FILL — Fosaprepitant Dimeglumine For IV Infusion 150 MG (Base Eq): INTRAVENOUS | Qty: 5 | Status: AC

## 2022-02-21 MED FILL — Dexamethasone Sodium Phosphate Inj 100 MG/10ML: INTRAMUSCULAR | Qty: 1 | Status: AC

## 2022-02-22 ENCOUNTER — Inpatient Hospital Stay: Payer: BC Managed Care – PPO

## 2022-02-22 ENCOUNTER — Encounter: Payer: Self-pay | Admitting: Hematology and Oncology

## 2022-02-22 ENCOUNTER — Other Ambulatory Visit: Payer: Self-pay

## 2022-02-22 ENCOUNTER — Inpatient Hospital Stay: Payer: BC Managed Care – PPO | Attending: Gynecologic Oncology | Admitting: Hematology and Oncology

## 2022-02-22 ENCOUNTER — Other Ambulatory Visit (HOSPITAL_COMMUNITY): Payer: Self-pay

## 2022-02-22 VITALS — BP 155/109 | HR 79 | Temp 97.5°F | Resp 18 | Ht 62.0 in | Wt 143.8 lb

## 2022-02-22 VITALS — BP 155/82 | HR 77 | Resp 18

## 2022-02-22 DIAGNOSIS — Z5112 Encounter for antineoplastic immunotherapy: Secondary | ICD-10-CM | POA: Diagnosis not present

## 2022-02-22 DIAGNOSIS — Z7189 Other specified counseling: Secondary | ICD-10-CM

## 2022-02-22 DIAGNOSIS — T50905A Adverse effect of unspecified drugs, medicaments and biological substances, initial encounter: Secondary | ICD-10-CM | POA: Diagnosis not present

## 2022-02-22 DIAGNOSIS — C786 Secondary malignant neoplasm of retroperitoneum and peritoneum: Secondary | ICD-10-CM | POA: Insufficient documentation

## 2022-02-22 DIAGNOSIS — C562 Malignant neoplasm of left ovary: Secondary | ICD-10-CM

## 2022-02-22 DIAGNOSIS — I158 Other secondary hypertension: Secondary | ICD-10-CM | POA: Diagnosis not present

## 2022-02-22 DIAGNOSIS — Z5111 Encounter for antineoplastic chemotherapy: Secondary | ICD-10-CM | POA: Insufficient documentation

## 2022-02-22 DIAGNOSIS — Z79899 Other long term (current) drug therapy: Secondary | ICD-10-CM | POA: Diagnosis not present

## 2022-02-22 LAB — CMP (CANCER CENTER ONLY)
ALT: 24 U/L (ref 0–44)
AST: 35 U/L (ref 15–41)
Albumin: 4 g/dL (ref 3.5–5.0)
Alkaline Phosphatase: 79 U/L (ref 38–126)
Anion gap: 4 — ABNORMAL LOW (ref 5–15)
BUN: 7 mg/dL — ABNORMAL LOW (ref 8–23)
CO2: 26 mmol/L (ref 22–32)
Calcium: 8.7 mg/dL — ABNORMAL LOW (ref 8.9–10.3)
Chloride: 109 mmol/L (ref 98–111)
Creatinine: 0.49 mg/dL (ref 0.44–1.00)
GFR, Estimated: >60 mL/min (ref >60)
Glucose, Bld: 102 mg/dL — ABNORMAL HIGH (ref 70–99)
Potassium: 4 mmol/L (ref 3.5–5.1)
Sodium: 139 mmol/L (ref 135–145)
Total Bilirubin: 1.2 mg/dL (ref 0.3–1.2)
Total Protein: 5.8 g/dL — ABNORMAL LOW (ref 6.5–8.1)

## 2022-02-22 LAB — CBC WITH DIFFERENTIAL (CANCER CENTER ONLY)
Abs Immature Granulocytes: 0.01 10*3/uL (ref 0.00–0.07)
Basophils Absolute: 0 10*3/uL (ref 0.0–0.1)
Basophils Relative: 1 %
Eosinophils Absolute: 0.1 10*3/uL (ref 0.0–0.5)
Eosinophils Relative: 2 %
HCT: 35.7 % — ABNORMAL LOW (ref 36.0–46.0)
Hemoglobin: 12.3 g/dL (ref 12.0–15.0)
Immature Granulocytes: 0 %
Lymphocytes Relative: 27 %
Lymphs Abs: 1.1 10*3/uL (ref 0.7–4.0)
MCH: 35.4 pg — ABNORMAL HIGH (ref 26.0–34.0)
MCHC: 34.5 g/dL (ref 30.0–36.0)
MCV: 102.9 fL — ABNORMAL HIGH (ref 80.0–100.0)
Monocytes Absolute: 0.3 10*3/uL (ref 0.1–1.0)
Monocytes Relative: 8 %
Neutro Abs: 2.5 10*3/uL (ref 1.7–7.7)
Neutrophils Relative %: 62 %
Platelet Count: 195 10*3/uL (ref 150–400)
RBC: 3.47 MIL/uL — ABNORMAL LOW (ref 3.87–5.11)
RDW: 12.9 % (ref 11.5–15.5)
WBC Count: 4 10*3/uL (ref 4.0–10.5)
nRBC: 0 % (ref 0.0–0.2)

## 2022-02-22 LAB — TOTAL PROTEIN, URINE DIPSTICK: Protein, ur: NEGATIVE mg/dL

## 2022-02-22 MED ORDER — SODIUM CHLORIDE 0.9 % IV SOLN
10.0000 mg/kg | Freq: Once | INTRAVENOUS | Status: AC
  Administered 2022-02-22: 700 mg via INTRAVENOUS
  Filled 2022-02-22: qty 16

## 2022-02-22 MED ORDER — SODIUM CHLORIDE 0.9% FLUSH
10.0000 mL | INTRAVENOUS | Status: DC | PRN
  Administered 2022-02-22: 10 mL

## 2022-02-22 MED ORDER — SODIUM CHLORIDE 0.9% FLUSH
10.0000 mL | Freq: Once | INTRAVENOUS | Status: AC
  Administered 2022-02-22: 10 mL

## 2022-02-22 MED ORDER — SODIUM CHLORIDE 0.9 % IV SOLN
10.0000 mg | Freq: Once | INTRAVENOUS | Status: AC
  Administered 2022-02-22: 10 mg via INTRAVENOUS
  Filled 2022-02-22: qty 10

## 2022-02-22 MED ORDER — SODIUM CHLORIDE 0.9 % IV SOLN
413.5000 mg | Freq: Once | INTRAVENOUS | Status: AC
  Administered 2022-02-22: 410 mg via INTRAVENOUS
  Filled 2022-02-22: qty 41

## 2022-02-22 MED ORDER — DOXORUBICIN HCL LIPOSOMAL CHEMO INJECTION 2 MG/ML
30.0000 mg/m2 | Freq: Once | INTRAVENOUS | Status: DC
  Filled 2022-02-22: qty 26

## 2022-02-22 MED ORDER — HEPARIN SOD (PORK) LOCK FLUSH 100 UNIT/ML IV SOLN
500.0000 [IU] | Freq: Once | INTRAVENOUS | Status: AC | PRN
  Administered 2022-02-22: 500 [IU]

## 2022-02-22 MED ORDER — SODIUM CHLORIDE 0.9 % IV SOLN: INTRAVENOUS | Status: DC

## 2022-02-22 MED ORDER — SODIUM CHLORIDE 0.9 % IV SOLN
150.0000 mg | Freq: Once | INTRAVENOUS | Status: AC
  Administered 2022-02-22: 150 mg via INTRAVENOUS
  Filled 2022-02-22: qty 150

## 2022-02-22 MED ORDER — DEXTROSE 5 % IV SOLN: Freq: Once | INTRAVENOUS | Status: AC

## 2022-02-22 MED ORDER — CETIRIZINE HCL 10 MG/ML IV SOLN
10.0000 mg | Freq: Once | INTRAVENOUS | Status: AC
  Administered 2022-02-22: 10 mg via INTRAVENOUS
  Filled 2022-02-22: qty 1

## 2022-02-22 MED ORDER — FAMOTIDINE IN NACL 20-0.9 MG/50ML-% IV SOLN
20.0000 mg | Freq: Once | INTRAVENOUS | Status: AC
  Administered 2022-02-22: 20 mg via INTRAVENOUS
  Filled 2022-02-22: qty 50

## 2022-02-22 MED ORDER — DOXORUBICIN HCL LIPOSOMAL CHEMO INJECTION 2 MG/ML
29.0000 mg/m2 | Freq: Once | INTRAVENOUS | Status: AC
  Administered 2022-02-22: 50 mg via INTRAVENOUS
  Filled 2022-02-22: qty 25

## 2022-02-22 MED ORDER — PALONOSETRON HCL INJECTION 0.25 MG/5ML
0.2500 mg | Freq: Once | INTRAVENOUS | Status: AC
  Administered 2022-02-22: 0.25 mg via INTRAVENOUS
  Filled 2022-02-22: qty 5

## 2022-02-22 MED ORDER — HYDRALAZINE HCL 25 MG PO TABS
25.0000 mg | ORAL_TABLET | Freq: Two times a day (BID) | ORAL | 1 refills | Status: DC
  Filled 2022-02-22: qty 60, 30d supply, fill #0

## 2022-02-22 NOTE — Progress Notes (Signed)
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[facility]) Her recent echocardiogram is normal but showed significant elevated blood pressure Her documented blood pressure from home has been intermittently elevated I will modify her blood pressure medications I recommend the patient to continue checking her blood pressure twice daily and we will call her at the end of the week for assessment We will proceed with chemotherapy as scheduled, last cycle today She will return in 2 weeks for bevacizumab treatment only I plan to repeat imaging study at the end of the month  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 Her blood pressure is quite elevated likely due to anxiety She has no signs of proteinuria I recommend addition of hydralazine to lisinopril I am avoiding beta-blocker as well as calcium channel blocker due to borderline bradycardia I am also avoiding diuretic right now due to inconsistent oral fluid intake and risk of dehydration  Orders Placed This Encounter  Procedures   CT ABDOMEN PELVIS W CONTRAST    Standing Status:   Future    Standing Expiration Date:   02/23/2023    Order Specific Question:   If indicated for the ordered procedure, I authorize the administration of contrast media per Radiology protocol    Answer:   Yes    Order Specific Question:   Preferred imaging location?    Answer:   [facility]    Order Specific Question:   Radiology Contrast Protocol - do NOT remove file path    Answer:   [email or web address]    All questions were answered. The patient knows to call the clinic with any problems, questions or concerns. The total time spent in the appointment was 30 minutes encounter with patients including review of chart and various tests results, discussions about plan  of care and coordination of care plan   [name] Lark, MD 02/22/2022 9:07 AM  INTERVAL HISTORY: Please see below for problem oriented charting. she returns for treatment follow-up seen prior to cycle 6 of treatment I reviewed documented blood pressure from home Majority of her blood pressure is high with systolic blood pressure average around 150 Her heart rate range between upper 50s to 60s She is not having any headaches or changes in vision with elevated blood pressure No recent nausea or changes in bowel habits   REVIEW OF SYSTEMS:   Constitutional: Denies fevers, chills or abnormal weight loss Eyes: Denies blurriness of vision Ears, nose, mouth, throat, and face: Denies mucositis or sore throat Respiratory: Denies cough, dyspnea or wheezes Cardiovascular: Denies palpitation, chest discomfort or lower extremity swelling Gastrointestinal:  Denies nausea, heartburn or change in bowel habits Skin: Denies abnormal skin rashes Lymphatics: Denies new lymphadenopathy or easy bruising Neurological:Denies numbness, tingling or new weaknesses Behavioral/Psych: Mood is stable, no new changes  All other systems were reviewed with the patient and are negative.  I have reviewed the past medical history, past surgical history, social history and family history with the patient and they are unchanged from previous note.  ALLERGIES:  is allergic to skin adhesives [cyanoacrylate], shrimp [shellfish allergy], and sulfonamide derivatives.  MEDICATIONS:  Current Outpatient Medications  Medication Sig Dispense Refill   hydrALAZINE (APRESOLINE) 25 MG tablet Take 1 tablet (25 mg total) by mouth 2 (two) times daily. 60 tablet 1   ALPRAZolam (XANAX) 0.25 MG tablet Take 1 tablet by mouth  at bedtime as needed for anxiety. 30 tablet 0   carboxymethylcellulose (REFRESH PLUS) 0.5 % SOLN Place 1 drop into both eyes 2 (two) times daily as needed (dry eyes).     Cholecalciferol (VITAMIN D3) 25 MCG (1000 UT) CAPS  Take 1,000 Units by mouth daily.      esomeprazole (NEXIUM) 20 MG capsule Take 20 mg by mouth daily.      estradiol (ESTRACE VAGINAL) 0.1 MG/GM vaginal cream Place 1 Applicatorful vaginally 3 (three) times a week. 42.5 g 12   lisinopril (ZESTRIL) 10 MG tablet Take 1 tablet (10 mg total) by mouth daily. 30 tablet 1   loratadine (CLARITIN) 10 MG tablet Take 10 mg by mouth daily as needed for allergies.     Multiple Vitamin (MULTIVITAMIN WITH MINERALS) TABS tablet Take 1 tablet by mouth daily.     ondansetron (ZOFRAN) 8 MG tablet Take 1 tablet by mouth 2 times daily as needed. Start on the third day after chemotherapy. 30 tablet 1   prochlorperazine (COMPAZINE) 10 MG tablet Take 1 tablet by mouth every 6 hours as needed (Nausea or vomiting). 30 tablet 1   No current facility-administered medications for this visit.   Facility-Administered Medications Ordered in Other Visits  Medication Dose Route Frequency Provider Last Rate Last Admin   0.9 %  sodium chloride infusion   Intravenous Continuous Alvy Bimler, Tomasina Keasling, MD 20 mL/hr at 02/22/22 0848 New Bag at 02/22/22 0848   bevacizumab-awwb (MVASI) 700 mg in sodium chloride 0.9 % 100 mL chemo infusion  10 mg/kg (Treatment Plan Recorded) Intravenous Once Alvy Bimler, Rickia Freeburg, MD       CARBOplatin (PARAPLATIN) 410 mg in sodium chloride 0.9 % 250 mL chemo infusion  410 mg Intravenous Once Alvy Bimler, Ryley Bachtel, MD       cetirizine (QUZYTTIR) injection 10 mg  10 mg Intravenous Once Alvy Bimler, Jonathandavid Marlett, MD       dexamethasone (DECADRON) 10 mg in sodium chloride 0.9 % 50 mL IVPB  10 mg Intravenous Once Alvy Bimler, Taima Rada, MD 204 mL/hr at 02/22/22 0854 10 mg at 02/22/22 0854   dextrose 5 % solution   Intravenous Once Alvy Bimler, Melisssa Donner, MD       DOXOrubicin HCL LIPOSOMAL (DOXIL) 50 mg in dextrose 5 % 250 mL chemo infusion  29 mg/m2 (Treatment Plan Recorded) Intravenous Once Alvy Bimler, Marayah Higdon, MD       famotidine (PEPCID) IVPB 20 mg premix  20 mg Intravenous Once Alvy Bimler, Minda Faas, MD       fosaprepitant (EMEND) 150 mg  in sodium chloride 0.9 % 145 mL IVPB  150 mg Intravenous Once [name] Lark, MD 450 mL/hr at 02/22/22 0852 150 mg at 02/22/22 0852   heparin lock flush 100 unit/mL  500 Units Intracatheter Once PRN Alvy Bimler, Meagan Spease, MD       palonosetron (ALOXI) injection 0.25 mg  0.25 mg Intravenous Once Alvy Bimler, Arleene Settle, MD       sodium chloride flush (NS) 0.9 % injection 10 mL  10 mL Intracatheter PRN [name] Lark, MD        SUMMARY OF ONCOLOGIC HISTORY: Oncology History Overview Note  Her 2 negative, MSI stable, 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 14.7 cm poorly defined soft tissue mass in central pelvis suspicious for primary ovarian carcinoma, with uterine leiomyosarcoma considered a less likely differential diagnosis. 2. Moderate ascites and diffuse peritoneal carcinomatosis. 3. Several small uterine fibroids.   11/13/2018 Tumor Marker   Patient's tumor was  tested for the following markers: CA-125 Results of the tumor marker test revealed 1434   11/21/2018 Procedure   Successful ultrasound-guided diagnostic and therapeutic paracentesis yielding 3.1 liters of peritoneal fluid   11/21/2018 Pathology Results   PERITONEAL/ASCITIC FLUID(SPECIMEN 1 OF 1 COLLECTED 11/21/18): ADENOCARCINOMA. Specimen Clinical Information Pelvic mass suspicious for ovarian cancer Source Peritoneal/Ascitic Fluid, (specimen 1 of 1 collected 11/21/18) Gross Specimen: Received is/are 1000 ccs of dark amber fluid. (CM:cm) Prepared: # Smears: 0 # Concentration Technique Slides (i.e. 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   11/29/2018 Initial Diagnosis   Ovarian cancer ([place])   11/29/2018 Cancer Staging   Staging form: Ovary, Fallopian Tube, and Primary Peritoneal Carcinoma, AJCC 8th Edition - Clinical: cT3, cN0, cM0 - Signed by [name] Lark, MD on 11/29/2018    12/03/2018 Procedure   Successful ultrasound-guided therapeutic paracentesis yielding 3.7 liters of peritoneal fluid.     12/06/2018 Procedure   Placement of a subcutaneous port device. Catheter tip at the SVC and right atrium junction   12/14/2018 Procedure   Successful ultrasound-guided paracentesis yielding 2.9 L of peritoneal fluid   12/28/2018 Tumor Marker   Patient's tumor was tested for the following markers: CA-125 Results of the tumor marker test revealed 812.   12/28/2018 - 04/22/2019 Chemotherapy   The patient had carboplatin and taxol for neoadjuvant treatment, followed by interval debulking surgery and subsequent adjuvant chemotherapy treatment.     02/01/2019 Imaging   Ct abdomen and pelvis 8.6 cm left ovarian mass, corresponding to the patient's known primary neoplasm, improved.   Mild peritoneal nodularity/omental caking, improved.   Small abdominopelvic ascites, improved.     02/01/2019 Tumor Marker   Patient's tumor was tested for the following markers: CA-125 Results of the tumor marker test revealed 183.   02/12/2019 Surgery   Preoperative Diagnosis: Stage IIIC ovarian cancer, s/p neoadjuvant chemotherapy      Procedure(s) Performed: 1. Exploratory laparotomy with total abdominal hysterectomy, bilateral salpingo-oophorectomy, omentectomy radical tumor debulking for ovarian cancer.   Surgeon: Thereasa Solo, MD.    Operative Findings: upper abdomen free of disease. No visible omental disease. Small volume (200cc) ascites. 8cm friable mass replacing left ovary and adherent to the sigmoid colon mesentery and ureter on the left.  Anterior fibroid. This represented an optimal cytoreduction (R0) with no gross visible disease remaining.    02/12/2019 Pathology Results   A. OVARY AND FALLOPIAN TUBE, LEFT, SALPINGO-OOPHORECTOMY: - Serous carcinoma, high grade, status post neoadjuvant therapy - See oncology table and comment below B. UTERUS CERVIX WITH RIGHT FALLOPIAN TUBE  AND OVARY, HYSTERECTOMY: Uterus: - Serosal surface involved by serous carcinoma - Endomyometrium uninvolved by carcinoma - Benign endometrial polyp (4.1 cm) - Leiomyomata (5.5 cm; largest) - Adenomyosis Cervix: - Uninvolved by carcinoma Left ovary: - Serous carcinoma, high grade Left fallopian tube: - Serous carcinoma, high grade C. SOFT TISSUE, LEFT PELVIC SIDEWALL TUMOR, EXCISION: - Metastatic serous carcinoma, high-grade D. SOFT TISSUE, SIGMOID COLON MESENTERY, EXCISION: - Metastatic serous carcinoma, high-grade E. OMENTUM, TUMOR RESECTION: - Metastatic serous carcinoma, high-grade OVARY or FALLOPIAN TUBE or PRIMARY PERITONEUM: Procedure: Total hysterectomy and bilateral salpingo-oophorectomy, omentectomy and peritoneal biopsies Specimen Integrity: Fragmented Tumor Site: Left ovary and fallopian tube Ovarian Surface Involvement: Present Fallopian Tube Surface Involvement: Present Tumor Size: 6.3 cm (see comment) Histologic Type: Serous carcinoma Histologic Grade: High-grade Implants: Not applicable Other Tissue/ Organ Involvement: Right ovary, right fallopian tube, omentum, mesentery Largest Extrapelvic  Peritoneal Focus: Macroscopic Peritoneal/Ascitic Fluid: Malignant Treatment Effect: No definite or minimal response identified (chemotherapy response score 1 [CRS1] Regional Lymph Nodes: No lymph nodes submitted or found Pathologic Stage Classification (pTNM, AJCC 8th Edition): pT3c, pNX Representative Tumor Block: A4 Comment(s): Additional testing (HER-2, MMR and MSI) are pending. The primary tumor site appears to be the left ovary and fallopian tube. The uterus is only involved on the serosal surface. There is tumor on the anterior peritoneal reflection.  Addendum: Tumor is Her2 negative,MSI stable   02/19/2019 Tumor Marker   Patient's tumor was tested for the following markers: CA-125 Results of the tumor marker test revealed 40.3   04/01/2019 Tumor Marker   Patient's  tumor was tested for the following markers: CA-125 Results of the tumor marker test revealed 8.1    Genetic Testing   Negative testing. No pathogenic variants identified on the [employer]. The report date is 04/17/2019.  Somatic genes analyzed through [facility]: ATM, BARD1, BRCA1, BRCA2, BRIP1, CHEK2, MRE11A, NBN, PALB2, RAD51C, RAD51D.  The CancerNext gene panel offered by [employer] includes sequencing and rearrangement analysis for the following 36 genes: APC*, ATM*, AXIN2, BARD1, BMPR1A, BRCA1*, BRCA2*, BRIP1*, CDH1*, CDK4, CDKN2A, CHEK2*, DICER1, MLH1*, MSH2*, MSH3, MSH6*, MUTYH*, NBN, NF1*, NTHL1, PALB2*, PMS2*, PTEN*, RAD51C*, RAD51D*, RECQL, SMAD4, SMARCA4, STK11 and TP53* (sequencing and deletion/duplication); HOXB13, POLD1 and POLE (sequencing only); EPCAM and GREM1 (deletion/duplication only).    05/21/2019 Imaging   1. Interval hysterectomy, oophorectomy and omentectomy. No evidence of residual ovarian carcinoma in the pelvis. No evidence of peritoneal disease. No intraperitoneal free fluid. 2. Nonobstructing LEFT renal calculi.  Normal ureters.   05/22/2019 Tumor Marker   Patient's tumor was tested for the following markers: CA-125 Results of the tumor marker test revealed 5.7.   06/03/2019 Procedure   Successful right IJ vein Port-A-Cath explant.   08/26/2019 Tumor Marker   Patient's tumor was tested for the following markers: CA-125. Results of the tumor marker test revealed 4.9   10/03/2019 Imaging   1. New tumor deposits along the capsular surfaces of the liver, spleen, in the left pelvis, and right paracolic gutter, compatible with metastatic disease. 2. Mild dilation of the dorsal pancreatic duct in the pancreatic body and head, cause uncertain. 3. Nonobstructive left nephrolithiasis. 4. Aortic atherosclerosis.   Aortic Atherosclerosis (ICD10-I70.0   10/10/2019 Procedure   Successful placement of a right IJ approach Power Port with  ultrasound and fluoroscopic guidance. The catheter is ready for use.     10/11/2019 Tumor Marker   Patient's tumor was tested for the following markers: CA-125 Results of the tumor marker test revealed 19.6.   10/14/2019 -  Chemotherapy   The patient had carboplatin and gemzar for chemotherapy treatment.     11/04/2019 Tumor Marker   Patient's tumor was tested for the following markers: CA-125 Results of the tumor marker test revealed 11.4   11/28/2019 Tumor Marker   Patient's tumor was tested for the following markers: CA-125 Results of the tumor marker test revealed 5.8   12/20/2019 Imaging   1. Significant interval reduction in mixed solid and cystic nodule in the vicinity of the left ovary, with significant improvement or resolution of multiple pelvic, peritoneal, and organ capsule implants. Resolution of previously seen small volume ascites. Findings are consistent with treatment response of abdominal metastatic disease. 2. Status post hysterectomy, oophorectomy, and omentectomy. 3. Nonobstructive left nephrolithiasis. 4. Aortic Atherosclerosis (ICD10-I70.0).   12/23/2019 Tumor Marker   Patient's tumor was tested for the following  markers: CA-125 Results of the tumor marker test revealed 5.0   01/24/2020 Tumor Marker   Patient's tumor was tested for the following markers: CA-125 Results of the tumor marker test revealed 4.8   02/24/2020 Tumor Marker   Patient's tumor was tested for the following markers: CA-125. Results of the tumor marker test revealed 4.3.   03/06/2020 Imaging   1. Status post hysterectomy, bilateral oophorectomy, and omentectomy. 2. Resolution of previously described left adnexal nodularity. 3. No residual disease identified. 4.  Aortic Atherosclerosis (ICD10-I70.0). 5. Left nephrolithiasis.   04/20/2020 Tumor Marker   Patient's tumor was tested for the following markers: CA-125 Results of the tumor marker test revealed 4.6   05/18/2020 Tumor Marker    Patient's tumor was tested for the following markers: CA-125 Results of the tumor marker test revealed 4.7   05/29/2020 Imaging   Status post hysterectomy and bilateral salpingo oophorectomy.   No evidence of recurrent or metastatic disease.   06/08/2020 - 09/13/2021 Chemotherapy   She was on niraparib       06/17/2020 Tumor Marker   Patient's tumor was tested for the following markers: CA-125 Results of the tumor marker test revealed 4.5   08/04/2020 Tumor Marker   Patient's tumor was tested for the following markers: CA-125. Results of the tumor marker test revealed 5.7   09/07/2020 Imaging   1. Status post hysterectomy and bilateral salpingo oophorectomy. No evidence of recurrent or metastatic disease. 2. Nonobstructing left nephrolithiasis. 3. Left-sided colonic diverticulosis without findings of acute diverticulitis. 4. Aortic atherosclerosis.     09/07/2020 Tumor Marker   Patient's tumor was tested for the following markers: CA-125 Results of the tumor marker test revealed 4.8.   10/21/2020 Tumor Marker   Patient's tumor was tested for the following markers: CA-125. Results of the tumor marker test revealed 5.1.   12/14/2020 Tumor Marker   Patient's tumor was tested for the following markers: CA-125. Results of the tumor marker test revealed 5.5.   04/05/2021 Tumor Marker   Patient's tumor was tested for the following markers: CA-125. Results of the tumor marker test revealed 5.9.   05/31/2021 Tumor Marker   Patient's tumor was tested for the following markers: CA-125. Results of the tumor marker test revealed 8.1.   09/13/2021 Imaging   1. Interval development of multiple new cystic and solid lesions in the abdomen and pelvis measuring up to 10.3 x 7.2 cm and consistent with peritoneal metastatic disease. 2. Nonobstructing left renal stones. 3. Aortic Atherosclerosis (ICD10-I70.0).     09/14/2021 Tumor Marker   Patient's tumor was tested for the following markers:  CA-125. Results of the tumor marker test revealed 28.5.   09/16/2021 Echocardiogram   1. Left ventricular ejection fraction, by estimation, is 60 to 65%. The left ventricle has normal function. The left ventricle has no regional wall motion abnormalities. Left ventricular diastolic parameters are consistent with Grade I diastolic dysfunction (impaired relaxation).  2. Right ventricular systolic function is normal. The right ventricular size is normal.  3. The mitral valve is normal in structure. Trivial mitral valve regurgitation. No evidence of mitral stenosis.  4. The aortic valve has an indeterminant number of cusps. Aortic valve regurgitation is trivial. No aortic stenosis is present.  5. The inferior vena cava is normal in size with greater than 50% respiratory variability, suggesting right atrial pressure of 3 mmHg.     09/20/2021 - 01/17/2022 Chemotherapy   Patient is on Treatment Plan : OVARIAN RECURRENT Liposomal Doxorubicin + Carboplatin  q28d X 6 Cycles     09/20/2021 -  Chemotherapy   Patient is on Treatment Plan : OVARIAN RECURRENT Liposomal Doxorubicin + Carboplatin q28d X 6 Cycles     09/23/2021 Tumor Marker   Patient's tumor was tested for the following markers: CA-125. Results of the tumor marker test revealed 27.7.   11/24/2021 Tumor Marker   Patient's tumor was tested for the following markers: CA-125. Results of the tumor marker test revealed 28.4.   12/13/2021 Imaging   1. Overall stable peritoneal carcinomatosis. No new disease is demonstrated. 2. Moderate left-sided hydroureteronephrosis likely due to compression of the left ureter by the left pelvic mass. 3. Stable left-sided renal calculi. 4. Numerous left renal calculi.     12/22/2021 Tumor Marker   Patient's tumor was tested for the following markers: CA-125. Results of the tumor marker test revealed 24.7.   01/18/2022 Tumor Marker   Patient's tumor was tested for the following markers: CA-125. Results of the tumor  marker test revealed 9.9.   01/27/2022 Echocardiogram    1. Left ventricular ejection fraction, by estimation, is 60 to 65%. The left ventricle has normal function. The left ventricle has no regional wall motion abnormalities. Left ventricular diastolic parameters are consistent with Grade I diastolic dysfunction (impaired relaxation). The average left ventricular global longitudinal strain is -21.1 %. The global longitudinal strain is normal.  2. Right ventricular systolic function is normal. The right ventricular size is normal.  3. The mitral valve is abnormal. Mild mitral valve regurgitation.  4. The aortic valve is tricuspid. Aortic valve regurgitation is trivial.  5. The inferior vena cava is normal in size with greater than 50% respiratory variability, suggesting right atrial pressure of 3 mmHg.     PHYSICAL EXAMINATION: ECOG PERFORMANCE STATUS: 1 - Symptomatic but completely ambulatory  Vitals:   02/22/22 0811  BP: (!) 155/109  Pulse: 79  Resp: 18  Temp: (!) 97.5 F (36.4 C)  SpO2: 99%   Filed Weights   02/22/22 0811  Weight: 143 lb 12.8 oz (65.2 kg)    GENERAL:alert, no distress and comfortable NEURO: alert & oriented x 3 with fluent speech, no focal motor/sensory deficits  LABORATORY DATA:  I have reviewed the data as listed    Component Value Date/Time   NA 139 02/22/2022 0747   K 4.0 02/22/2022 0747   CL 109 02/22/2022 0747   CO2 26 02/22/2022 0747   GLUCOSE 102 (H) 02/22/2022 0747   BUN 7 (L) 02/22/2022 0747   CREATININE 0.49 02/22/2022 0747   CALCIUM 8.7 (L) 02/22/2022 0747   PROT 5.8 (L) 02/22/2022 0747   ALBUMIN 4.0 02/22/2022 0747   AST 35 02/22/2022 0747   ALT 24 02/22/2022 0747   ALKPHOS 79 02/22/2022 0747   BILITOT 1.2 02/22/2022 0747   GFRNONAA >60 02/22/2022 0747   GFRAA >60 02/24/2020 0852    No results found for: "SPEP", "UPEP"  Lab Results  Component Value Date   WBC 4.0 02/22/2022   NEUTROABS 2.5 02/22/2022   HGB 12.3 02/22/2022    HCT 35.7 (L) 02/22/2022   MCV 102.9 (H) 02/22/2022   PLT 195 02/22/2022      Chemistry      Component Value Date/Time   NA 139 02/22/2022 0747   K 4.0 02/22/2022 0747   CL 109 02/22/2022 0747   CO2 26 02/22/2022 0747   BUN 7 (L) 02/22/2022 0747   CREATININE 0.49 02/22/2022 0747      Component Value Date/Time  CALCIUM 8.7 (L) 02/22/2022 0747   ALKPHOS 79 02/22/2022 0747   AST 35 02/22/2022 0747   ALT 24 02/22/2022 0747   BILITOT 1.2 02/22/2022 0747       RADIOGRAPHIC STUDIES: I have personally reviewed the radiological images as listed and agreed with the findings in the report. ECHOCARDIOGRAM COMPLETE  Result Date: 01/26/2022    ECHOCARDIOGRAM REPORT   Patient Name:   Stacy Rivas Date of Exam: 01/26/2022 Medical Rec #:  [id number]        Height:       62.0 in Accession #:    [id number]       Weight:       147.6 lb Date of Birth:  [birth year]        BSA:          1.680 m Patient Age:    66 years         BP:           162/103 mmHg Patient Gender: F                HR:           74 bpm. Exam Location:  Outpatient Procedure: 2D Echo, Cardiac Doppler, Color Doppler and Strain Analysis Indications:    Chemo Z09  History:        Patient has prior history of Echocardiogram examinations, most                 recent 09/15/2021. Risk Factors:Dyslipidemia.  Sonographer:    Bernadene Person RDCS Referring Phys: [id number] Araeya Lamb [place]  1. Left ventricular ejection fraction, by estimation, is 60 to 65%. The left ventricle has normal function. The left ventricle has no regional wall motion abnormalities. Left ventricular diastolic parameters are consistent with Grade I diastolic dysfunction (impaired relaxation). The average left ventricular global longitudinal strain is -21.1 %. The global longitudinal strain is normal.  2. Right ventricular systolic function is normal. The right ventricular size is normal.  3. The mitral valve is abnormal. Mild mitral valve regurgitation.  4. The aortic valve is  tricuspid. Aortic valve regurgitation is trivial.  5. The inferior vena cava is normal in size with greater than 50% respiratory variability, suggesting right atrial pressure of 3 mmHg. Comparison(s): No significant change from prior study. 09/15/2021: LVEF 60-65%, grade 1 DD, trivial AI. FINDINGS  Left Ventricle: Left ventricular ejection fraction, by estimation, is 60 to 65%. The left ventricle has normal function. The left ventricle has no regional wall motion abnormalities. The average left ventricular global longitudinal strain is -21.1 %. The global longitudinal strain is normal. The left ventricular internal cavity size was normal in size. There is no left ventricular hypertrophy. Left ventricular diastolic parameters are consistent with Grade I diastolic dysfunction (impaired relaxation). Indeterminate filling pressures. Right Ventricle: The right ventricular size is normal. No increase in right ventricular wall thickness. Right ventricular systolic function is normal. Left Atrium: Left atrial size was normal in size. Right Atrium: Right atrial size was normal in size. Pericardium: There is no evidence of pericardial effusion. Mitral Valve: The mitral valve is abnormal. There is mild thickening of the anterior and posterior mitral valve leaflet(s). Mild mitral valve regurgitation. Tricuspid Valve: The tricuspid valve is grossly normal. Tricuspid valve regurgitation is trivial. Aortic Valve: The aortic valve is tricuspid. Aortic valve regurgitation is trivial. Pulmonic Valve: The pulmonic valve was normal in structure. Pulmonic valve regurgitation is not visualized. Aorta: The aortic root and ascending aorta  are structurally normal, with no evidence of dilitation. Venous: The inferior vena cava is normal in size with greater than 50% respiratory variability, suggesting right atrial pressure of 3 mmHg. IAS/Shunts: The interatrial septum appears to be lipomatous. No atrial level shunt detected by color flow  Doppler.  LEFT VENTRICLE PLAX 2D LVIDd:         4.30 cm     Diastology LVIDs:         2.60 cm     LV e' medial:    4.73 cm/s LV PW:         1.00 cm     LV E/e' medial:  11.2 LV IVS:        1.10 cm     LV e' lateral:   5.22 cm/s LVOT diam:     2.00 cm     LV E/e' lateral: 10.2 LV SV:         59 LV SV Index:   35          2D Longitudinal Strain LVOT Area:     3.14 cm    2D Strain GLS (A2C):   -21.5 %                            2D Strain GLS (A3C):   -19.7 %                            2D Strain GLS (A4C):   -22.2 % LV Volumes (MOD)           2D Strain GLS Avg:     -21.1 % LV vol d, MOD A2C: 88.0 ml LV vol d, MOD A4C: 80.9 ml LV vol s, MOD A2C: 36.4 ml LV vol s, MOD A4C: 39.8 ml LV SV MOD A2C:     51.6 ml LV SV MOD A4C:     80.9 ml LV SV MOD BP:      44.6 ml RIGHT VENTRICLE RV S prime:     14.80 cm/s TAPSE (M-mode): 2.5 cm LEFT ATRIUM             Index        RIGHT ATRIUM           Index LA diam:        3.90 cm 2.32 cm/m   RA Area:     14.50 cm LA Vol (A2C):   35.1 ml 20.89 ml/m  RA Volume:   32.90 ml  19.58 ml/m LA Vol (A4C):   30.0 ml 17.86 ml/m LA Biplane Vol: 33.7 ml 20.06 ml/m  AORTIC VALVE LVOT Vmax:   83.60 cm/s LVOT Vmean:  55.700 cm/s LVOT VTI:    0.189 m  AORTA Ao Root diam: 3.60 cm Ao Asc diam:  3.10 cm MITRAL VALVE MV Area (PHT): 2.09 cm    SHUNTS MV Decel Time: 363 msec    Systemic VTI:  0.19 m MV E velocity: 53.10 cm/s  Systemic Diam: 2.00 cm MV A velocity: 62.60 cm/s MV E/A ratio:  0.85 Lyman Bishop MD Electronically signed by Lyman Bishop MD Signature Date/Time: 01/26/2022/3:34:35 PM    Final

## 2022-02-22 NOTE — Assessment & Plan Note (Signed)
Her recent echocardiogram is normal but showed significant elevated blood pressure Her documented blood pressure from home has been intermittently elevated I will modify her blood pressure medications I recommend the patient to continue checking her blood pressure twice daily and we will call her at the end of the week for assessment We will proceed with chemotherapy as scheduled, last cycle today She will return in 2 weeks for bevacizumab treatment only I plan to repeat imaging study at the end of the month

## 2022-02-22 NOTE — Assessment & Plan Note (Signed)
Her blood pressure is quite elevated likely due to anxiety She has no signs of proteinuria I recommend addition of hydralazine to lisinopril I am avoiding beta-blocker as well as calcium channel blocker due to borderline bradycardia I am also avoiding diuretic right now due to inconsistent oral fluid intake and risk of dehydration

## 2022-02-22 NOTE — Patient Instructions (Signed)
[facility]  Discharge Instructions: Thank you for choosing [facility] to provide your oncology and hematology care.   If you have a lab appointment with the [facility], please go directly to the [facility] and check in at the registration area.   Wear comfortable clothing and clothing appropriate for easy access to any Portacath or PICC line.   We strive to give you quality time with your provider. You may need to reschedule your appointment if you arrive late (15 or more minutes).  Arriving late affects you and other patients whose appointments are after yours.  Also, if you miss three or more appointments without notifying the office, you may be dismissed from the clinic at the provider's discretion.      For prescription refill requests, have your pharmacy contact our office and allow 72 hours for refills to be completed.    Today you received the following chemotherapy and/or immunotherapy agents: MVASI, Doxil, Carbo.       To help prevent nausea and vomiting after your treatment, we encourage you to take your nausea medication as directed.  BELOW ARE SYMPTOMS THAT SHOULD BE REPORTED IMMEDIATELY: *FEVER GREATER THAN 100.4 F (38 C) OR HIGHER *CHILLS OR SWEATING *NAUSEA AND VOMITING THAT IS NOT CONTROLLED WITH YOUR NAUSEA MEDICATION *UNUSUAL SHORTNESS OF BREATH *UNUSUAL BRUISING OR BLEEDING *URINARY PROBLEMS (pain or burning when urinating, or frequent urination) *BOWEL PROBLEMS (unusual diarrhea, constipation, pain near the anus) TENDERNESS IN MOUTH AND THROAT WITH OR WITHOUT PRESENCE OF ULCERS (sore throat, sores in mouth, or a toothache) UNUSUAL RASH, SWELLING OR PAIN  UNUSUAL VAGINAL DISCHARGE OR ITCHING   Items with * indicate a potential emergency and should be followed up as soon as possible or go to the Emergency Department if any problems should occur.  Please show the CHEMOTHERAPY ALERT CARD or IMMUNOTHERAPY ALERT CARD at  check-in to the Emergency Department and triage nurse.  Should you have questions after your visit or need to cancel or reschedule your appointment, please contact [facility]  Dept: [phone]  and follow the prompts.  Office hours are 8:00 a.m. to 4:30 p.m. Monday - Friday. Please note that voicemails left after 4:00 p.m. may not be returned until the following business day.  We are closed weekends and major holidays. You have access to a nurse at all times for urgent questions. Please call the main number to the clinic Dept: [phone] and follow the prompts.   For any non-urgent questions, you may also contact your provider using MyChart. We now offer e-Visits for anyone 2 and older to request care online for non-urgent symptoms. For details visit [email or web address].   Also download the MyChart app! Go to the app store, search "MyChart", open the app, select [facility], and log in with your MyChart username and password.  Masks are optional in the cancer centers. If you would like for your care team to wear a mask while they are taking care of you, please let them know. You may have one support person who is at least 68 years old accompany you for your appointments.

## 2022-02-24 ENCOUNTER — Other Ambulatory Visit: Payer: Self-pay

## 2022-02-24 LAB — CA 125: Cancer Antigen (CA) 125: 8.5 U/mL (ref 0.0–38.1)

## 2022-02-25 ENCOUNTER — Telehealth: Payer: Self-pay

## 2022-02-25 NOTE — Telephone Encounter (Signed)
Called and given below message. She verbalized understanding. 

## 2022-02-25 NOTE — Telephone Encounter (Signed)
-----   Message from [name] Lark, MD sent at 02/25/2022  8:38 AM EDT ----- Can you call her to get BP readings?

## 2022-02-25 NOTE — Telephone Encounter (Signed)
Called to get bp reading and check on her. Yesterday she started feeling really tired and just sleeping a lot. No appetite. She is eating 5-6 small meals and able to drink with no problems. Complaining of headache today and is taking tylenol. When she is not feeling well she checks her bp.  10/3 bp pm 144/76, HR 59, later bp 130/69, HR 57 10/4 am bp 138/73, HR 54, did not check pm due to falling asleep. 10/5 bp am 165/83, HR 53. PM 165/83, HR  53 Repeated at bedtime BP 124/62, HR 69 10/6 am 0520 BP 138/70, HR 66 0600 BP 142/74, HR 59 0953 BP 141/81, hr 76

## 2022-02-25 NOTE — Telephone Encounter (Signed)
BP looks good now, no changes until I see her

## 2022-03-01 ENCOUNTER — Other Ambulatory Visit (HOSPITAL_COMMUNITY): Payer: Self-pay

## 2022-03-08 ENCOUNTER — Encounter: Payer: Self-pay | Admitting: Hematology and Oncology

## 2022-03-08 ENCOUNTER — Other Ambulatory Visit: Payer: Self-pay

## 2022-03-08 ENCOUNTER — Inpatient Hospital Stay (HOSPITAL_BASED_OUTPATIENT_CLINIC_OR_DEPARTMENT_OTHER): Payer: BC Managed Care – PPO | Admitting: Hematology and Oncology

## 2022-03-08 ENCOUNTER — Other Ambulatory Visit (HOSPITAL_COMMUNITY): Payer: Self-pay

## 2022-03-08 ENCOUNTER — Inpatient Hospital Stay: Payer: BC Managed Care – PPO

## 2022-03-08 VITALS — BP 161/83 | HR 90 | Temp 97.6°F | Resp 18 | Ht 62.0 in | Wt 142.8 lb

## 2022-03-08 DIAGNOSIS — T50905A Adverse effect of unspecified drugs, medicaments and biological substances, initial encounter: Secondary | ICD-10-CM

## 2022-03-08 DIAGNOSIS — D61818 Other pancytopenia: Secondary | ICD-10-CM | POA: Diagnosis not present

## 2022-03-08 DIAGNOSIS — C786 Secondary malignant neoplasm of retroperitoneum and peritoneum: Secondary | ICD-10-CM

## 2022-03-08 DIAGNOSIS — Z79899 Other long term (current) drug therapy: Secondary | ICD-10-CM | POA: Diagnosis not present

## 2022-03-08 DIAGNOSIS — I158 Other secondary hypertension: Secondary | ICD-10-CM | POA: Diagnosis not present

## 2022-03-08 DIAGNOSIS — J0110 Acute frontal sinusitis, unspecified: Secondary | ICD-10-CM

## 2022-03-08 DIAGNOSIS — Z5111 Encounter for antineoplastic chemotherapy: Secondary | ICD-10-CM | POA: Diagnosis not present

## 2022-03-08 DIAGNOSIS — Z5112 Encounter for antineoplastic immunotherapy: Secondary | ICD-10-CM | POA: Diagnosis not present

## 2022-03-08 DIAGNOSIS — Z7189 Other specified counseling: Secondary | ICD-10-CM

## 2022-03-08 DIAGNOSIS — C562 Malignant neoplasm of left ovary: Secondary | ICD-10-CM

## 2022-03-08 DIAGNOSIS — J329 Chronic sinusitis, unspecified: Secondary | ICD-10-CM | POA: Insufficient documentation

## 2022-03-08 LAB — CBC WITH DIFFERENTIAL (CANCER CENTER ONLY)
Abs Immature Granulocytes: 0 10*3/uL (ref 0.00–0.07)
Basophils Absolute: 0 10*3/uL (ref 0.0–0.1)
Basophils Relative: 0 %
Eosinophils Absolute: 0 10*3/uL (ref 0.0–0.5)
Eosinophils Relative: 1 %
HCT: 31 % — ABNORMAL LOW (ref 36.0–46.0)
Hemoglobin: 10.9 g/dL — ABNORMAL LOW (ref 12.0–15.0)
Immature Granulocytes: 0 %
Lymphocytes Relative: 32 %
Lymphs Abs: 0.8 10*3/uL (ref 0.7–4.0)
MCH: 35.3 pg — ABNORMAL HIGH (ref 26.0–34.0)
MCHC: 35.2 g/dL (ref 30.0–36.0)
MCV: 100.3 fL — ABNORMAL HIGH (ref 80.0–100.0)
Monocytes Absolute: 0.1 10*3/uL (ref 0.1–1.0)
Monocytes Relative: 4 %
Neutro Abs: 1.6 10*3/uL — ABNORMAL LOW (ref 1.7–7.7)
Neutrophils Relative %: 63 %
Platelet Count: 72 10*3/uL — ABNORMAL LOW (ref 150–400)
RBC: 3.09 MIL/uL — ABNORMAL LOW (ref 3.87–5.11)
RDW: 12 % (ref 11.5–15.5)
WBC Count: 2.5 10*3/uL — ABNORMAL LOW (ref 4.0–10.5)
nRBC: 0 % (ref 0.0–0.2)

## 2022-03-08 LAB — TOTAL PROTEIN, URINE DIPSTICK: Protein, ur: NEGATIVE mg/dL

## 2022-03-08 MED ORDER — AMOXICILLIN 500 MG PO CAPS
500.0000 mg | ORAL_CAPSULE | Freq: Two times a day (BID) | ORAL | 0 refills | Status: DC
  Filled 2022-03-08: qty 14, 7d supply, fill #0

## 2022-03-08 MED ORDER — SODIUM CHLORIDE 0.9% FLUSH
10.0000 mL | Freq: Once | INTRAVENOUS | Status: AC
  Administered 2022-03-08: 10 mL

## 2022-03-08 MED ORDER — LISINOPRIL 20 MG PO TABS
20.0000 mg | ORAL_TABLET | Freq: Every day | ORAL | 1 refills | Status: DC
  Filled 2022-03-08: qty 30, 30d supply, fill #0
  Filled 2022-04-05: qty 30, 30d supply, fill #1

## 2022-03-08 MED ORDER — PREDNISONE 20 MG PO TABS
20.0000 mg | ORAL_TABLET | Freq: Every day | ORAL | 0 refills | Status: DC
  Filled 2022-03-08: qty 7, 7d supply, fill #0

## 2022-03-08 MED ORDER — FUROSEMIDE 20 MG PO TABS
20.0000 mg | ORAL_TABLET | Freq: Every day | ORAL | 1 refills | Status: DC
  Filled 2022-03-08: qty 30, 30d supply, fill #0
  Filled 2022-04-05: qty 30, 30d supply, fill #1

## 2022-03-08 NOTE — Progress Notes (Signed)
DISCONTINUE ON PATHWAY REGIMEN - Ovarian     A cycle is every 28 days:     Carboplatin      Liposomal doxorubicin   **Always confirm dose/schedule in your pharmacy ordering system**  REASON: Other Reason PRIOR TREATMENT: [id number]: Liposomal Doxorubicin (Doxil) 30 mg/m2 + Carboplatin AUC=5 q28 Days; Re-evaluate Every 3 Cycles, Treat until Complete Response, Unacceptable Toxicity, or Disease Progression TREATMENT RESPONSE: Unable to Evaluate  START OFF PATHWAY REGIMEN - Ovarian   [id number]:Bevacizumab 15 mg/kg IV D1 q21 Days:   A cycle is every 21 days:     Bevacizumab-xxxx   **Always confirm dose/schedule in your pharmacy ordering system**  Patient Characteristics: Recurrent or Progressive Disease, Maintenance Therapy, BRCA-Mutation Absent BRCA Mutation Status: Absent Therapeutic Status: Recurrent or Progressive Disease Line of Therapy: Maintenance Therapy  Intent of Therapy: Non-Curative / Palliative Intent, Discussed with Patient

## 2022-03-08 NOTE — Assessment & Plan Note (Signed)
Unfortunately, she had significant uncontrolled hypertension, pancytopenia and active infection today She has completed 6 cycles of chemotherapy We will cancel her treatment with bevacizumab today to allow recovery I will see her again in 2 weeks for further follow-up and at that point in time, we will start her on maintenance bevacizumab, assuming CT imaging show positive response to therapy We will focus on supportive care and blood pressure management right now

## 2022-03-08 NOTE — Progress Notes (Signed)
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[facility]) Unfortunately, she had significant uncontrolled hypertension, pancytopenia and active infection today She has completed 6 cycles of chemotherapy We will cancel her treatment with bevacizumab today to allow recovery I will see her again in 2 weeks for further follow-up and at that point in time, we will start her on maintenance bevacizumab, assuming CT imaging show positive response to therapy We will focus on supportive care and blood pressure management right now  Pancytopenia, acquired ([place]) This is due to recent treatment She does not need transfusion support  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 She has uncontrolled hypertension due to bevacizumab and recent stress I recommend changing her blood pressure medications We will call her in a few days to check on her  Sinus infection She is very symptomatic from acute sinus infection I recommend low-dose prednisone along with a course of antibiotics  Orders Placed This Encounter  Procedures   CBC with Differential ([place] Only)    Standing Status:   Future    Standing Expiration Date:   03/23/2023   Total Protein, Urine dipstick    Standing Status:   Future    Standing Expiration Date:   03/23/2023   CBC with Differential ([place] Only)    Standing Status:   Future    Standing Expiration Date:   04/13/2023   CBC with Differential ([place] Only)    Standing Status:   Future    Standing Expiration Date:   05/04/2023   Total Protein, Urine dipstick    Standing Status:   Future    Standing Expiration Date:   05/04/2023   CBC with Differential ([place] Only)    Standing Status:   Future    Standing Expiration Date:   05/25/2023   CBC with Differential ([place] Only)    Standing Status:    Future    Standing Expiration Date:   06/15/2023   CBC with Differential ([facility] Only)    Standing Status:   Future    Standing Expiration Date:   07/06/2023   Total Protein, Urine dipstick    Standing Status:   Future    Standing Expiration Date:   07/06/2023    All questions were answered. The patient knows to call the clinic with any problems, questions or concerns. The total time spent in the appointment was 40 minutes encounter with patients including review of chart and various tests results, discussions about plan of care and coordination of care plan   [name] Lark, MD 03/08/2022 10:03 AM  INTERVAL HISTORY: Please see below for problem oriented charting. she returns for treatment follow-up to be seen prior to bevacizumab maintenance treatment She started to have sinus infection approximately a week ago with sinus congestion, pressure sensation, nasal drip and cough The mucus are somewhat bloody at times I also reviewed her blood pressure documentation over the past few weeks When we added hydralazine, her blood pressure was better controlled but over the past 8 days, her blood pressure started to increase again Her husband was recently ill in the hospital and she admits that she is undergoing tremendous stress She has significant fatigue  REVIEW OF SYSTEMS:   Constitutional: Denies fevers, chills or abnormal weight loss Eyes: Denies blurriness of vision Ears, nose, mouth, throat, and face: Denies mucositis or  sore throat Respiratory: Denies cough, dyspnea or wheezes Cardiovascular: Denies palpitation, chest discomfort or lower extremity swelling Gastrointestinal:  Denies nausea, heartburn or change in bowel habits Skin: Denies abnormal skin rashes Lymphatics: Denies new lymphadenopathy or easy bruising Neurological:Denies numbness, tingling or new weaknesses Behavioral/Psych: Mood is stable, no new changes  All other systems were reviewed with the patient and are  negative.  I have reviewed the past medical history, past surgical history, social history and family history with the patient and they are unchanged from previous note.  ALLERGIES:  is allergic to skin adhesives [cyanoacrylate], shrimp [shellfish allergy], and sulfonamide derivatives.  MEDICATIONS:  Current Outpatient Medications  Medication Sig Dispense Refill   amoxicillin (AMOXIL) 500 MG capsule Take 1 capsule (500 mg total) by mouth 2 (two) times daily. 14 capsule 0   furosemide (LASIX) 20 MG tablet Take 1 tablet (20 mg total) by mouth daily. 30 tablet 1   predniSONE (DELTASONE) 20 MG tablet Take 1 tablet (20 mg total) by mouth daily with breakfast. 7 tablet 0   ALPRAZolam (XANAX) 0.25 MG tablet Take 1 tablet by mouth at bedtime as needed for anxiety. 30 tablet 0   carboxymethylcellulose (REFRESH PLUS) 0.5 % SOLN Place 1 drop into both eyes 2 (two) times daily as needed (dry eyes).     Cholecalciferol (VITAMIN D3) 25 MCG (1000 UT) CAPS Take 1,000 Units by mouth daily.      esomeprazole (NEXIUM) 20 MG capsule Take 20 mg by mouth daily.      estradiol (ESTRACE VAGINAL) 0.1 MG/GM vaginal cream Place 1 Applicatorful vaginally 3 (three) times a week. 42.5 g 12   hydrALAZINE (APRESOLINE) 25 MG tablet Take 1 tablet (25 mg total) by mouth 2 (two) times daily. 60 tablet 1   lisinopril (ZESTRIL) 20 MG tablet Take 1 tablet (20 mg total) by mouth daily. 30 tablet 1   loratadine (CLARITIN) 10 MG tablet Take 10 mg by mouth daily as needed for allergies.     Multiple Vitamin (MULTIVITAMIN WITH MINERALS) TABS tablet Take 1 tablet by mouth daily.     ondansetron (ZOFRAN) 8 MG tablet Take 1 tablet by mouth 2 times daily as needed. Start on the third day after chemotherapy. 30 tablet 1   prochlorperazine (COMPAZINE) 10 MG tablet Take 1 tablet by mouth every 6 hours as needed (Nausea or vomiting). 30 tablet 1   No current facility-administered medications for this visit.    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 14.7 cm poorly defined soft tissue mass in central pelvis suspicious for primary ovarian carcinoma, with uterine leiomyosarcoma considered a less likely differential diagnosis. 2. Moderate ascites and diffuse peritoneal carcinomatosis. 3. Several small uterine fibroids.   11/13/2018 Tumor Marker   Patient's tumor was tested for the following markers: CA-125 Results of the tumor marker test revealed 1434   11/21/2018 Procedure   Successful ultrasound-guided diagnostic and therapeutic paracentesis yielding 3.1 liters of peritoneal fluid   11/21/2018 Pathology Results   PERITONEAL/ASCITIC FLUID(SPECIMEN 1 OF 1 COLLECTED 11/21/18): ADENOCARCINOMA. Specimen Clinical Information Pelvic mass suspicious for ovarian cancer Source Peritoneal/Ascitic Fluid, (specimen 1 of 1 collected 11/21/18) Gross Specimen: Received is/are 1000 ccs of dark amber fluid. (CM:cm) Prepared: # Smears: 0 # Concentration Technique Slides (i.e. ThinPrep): 1 # Cell Block: 1 Additional Studies: n/a Comment Comment: The cytologic features are most  consistent with serous carcinoma.   11/29/2018 Initial Diagnosis   Ovarian cancer ([place])   11/29/2018 Cancer Staging   Staging form: Ovary, Fallopian Tube, and Primary Peritoneal Carcinoma, AJCC 8th Edition - Clinical: cT3, cN0, cM0 - Signed by [name] Lark, MD on 11/29/2018   12/03/2018 Procedure   Successful ultrasound-guided therapeutic paracentesis yielding 3.7 liters of peritoneal fluid.     12/06/2018 Procedure   Placement of a subcutaneous port device. Catheter tip at the SVC and right atrium junction   12/14/2018 Procedure   Successful ultrasound-guided paracentesis yielding 2.9 L of peritoneal fluid   12/28/2018 Tumor Marker   Patient's tumor was tested for the following markers:  CA-125 Results of the tumor marker test revealed 812.   12/28/2018 - 04/22/2019 Chemotherapy   The patient had carboplatin and taxol for neoadjuvant treatment, followed by interval debulking surgery and subsequent adjuvant chemotherapy treatment.     02/01/2019 Imaging   Ct abdomen and pelvis 8.6 cm left ovarian mass, corresponding to the patient's known primary neoplasm, improved.   Mild peritoneal nodularity/omental caking, improved.   Small abdominopelvic ascites, improved.     02/01/2019 Tumor Marker   Patient's tumor was tested for the following markers: CA-125 Results of the tumor marker test revealed 183.   02/12/2019 Surgery   Preoperative Diagnosis: Stage IIIC ovarian cancer, s/p neoadjuvant chemotherapy      Procedure(s) Performed: 1. Exploratory laparotomy with total abdominal hysterectomy, bilateral salpingo-oophorectomy, omentectomy radical tumor debulking for ovarian cancer.   Surgeon: Thereasa Solo, MD.    Operative Findings: upper abdomen free of disease. No visible omental disease. Small volume (200cc) ascites. 8cm friable mass replacing left ovary and adherent to the sigmoid colon mesentery and ureter on the left.  Anterior fibroid. This represented an optimal cytoreduction (R0) with no gross visible disease remaining.    02/12/2019 Pathology Results   A. OVARY AND FALLOPIAN TUBE, LEFT, SALPINGO-OOPHORECTOMY: - Serous carcinoma, high grade, status post neoadjuvant therapy - See oncology table and comment below B. UTERUS CERVIX WITH RIGHT FALLOPIAN TUBE AND OVARY, HYSTERECTOMY: Uterus: - Serosal surface involved by serous carcinoma - Endomyometrium uninvolved by carcinoma - Benign endometrial polyp (4.1 cm) - Leiomyomata (5.5 cm; largest) - Adenomyosis Cervix: - Uninvolved by carcinoma Left ovary: - Serous carcinoma, high grade Left fallopian tube: - Serous carcinoma, high grade C. SOFT TISSUE, LEFT PELVIC SIDEWALL TUMOR, EXCISION: - Metastatic serous  carcinoma, high-grade D. SOFT TISSUE, SIGMOID COLON MESENTERY, EXCISION: - Metastatic serous carcinoma, high-grade E. OMENTUM, TUMOR RESECTION: - Metastatic serous carcinoma, high-grade OVARY or FALLOPIAN TUBE or PRIMARY PERITONEUM: Procedure: Total hysterectomy and bilateral salpingo-oophorectomy, omentectomy and peritoneal biopsies Specimen Integrity: Fragmented Tumor Site: Left ovary and fallopian tube Ovarian Surface Involvement: Present Fallopian Tube Surface Involvement: Present Tumor Size: 6.3 cm (see comment) Histologic Type: Serous carcinoma Histologic Grade: High-grade Implants: Not applicable Other Tissue/ Organ Involvement: Right ovary, right fallopian tube, omentum, mesentery Largest Extrapelvic Peritoneal Focus: Macroscopic Peritoneal/Ascitic Fluid: Malignant Treatment Effect: No definite or minimal response identified (chemotherapy response score 1 [CRS1] Regional Lymph Nodes: No lymph nodes submitted or found Pathologic Stage Classification (pTNM, AJCC 8th Edition): pT3c, pNX Representative Tumor Block: A4 Comment(s): Additional testing (HER-2, MMR and MSI) are pending. The primary tumor site appears to be the left ovary and fallopian tube. The uterus is only involved on the serosal surface. There is tumor on the anterior peritoneal reflection.  Addendum: Tumor is Her2 negative,MSI stable   02/19/2019 Tumor Marker   Patient's tumor was tested for the following  markers: CA-125 Results of the tumor marker test revealed 40.3   04/01/2019 Tumor Marker   Patient's tumor was tested for the following markers: CA-125 Results of the tumor marker test revealed 8.1    Genetic Testing   Negative testing. No pathogenic variants identified on the [employer]. The report date is 04/17/2019.  Somatic genes analyzed through [facility]: ATM, BARD1, BRCA1, BRCA2, BRIP1, CHEK2, MRE11A, NBN, PALB2, RAD51C, RAD51D.  The [facility] gene panel offered by [employer] includes sequencing and rearrangement analysis for the following 36 genes: APC*, ATM*, AXIN2, BARD1, BMPR1A, BRCA1*, BRCA2*, BRIP1*, CDH1*, CDK4, CDKN2A, CHEK2*, DICER1, MLH1*, MSH2*, MSH3, MSH6*, MUTYH*, NBN, NF1*, NTHL1, PALB2*, PMS2*, PTEN*, RAD51C*, RAD51D*, RECQL, SMAD4, SMARCA4, STK11 and TP53* (sequencing and deletion/duplication); HOXB13, POLD1 and POLE (sequencing only); EPCAM and GREM1 (deletion/duplication only).    05/21/2019 Imaging   1. Interval hysterectomy, oophorectomy and omentectomy. No evidence of residual ovarian carcinoma in the pelvis. No evidence of peritoneal disease. No intraperitoneal free fluid. 2. Nonobstructing LEFT renal calculi.  Normal ureters.   05/22/2019 Tumor Marker   Patient's tumor was tested for the following markers: CA-125 Results of the tumor marker test revealed 5.7.   06/03/2019 Procedure   Successful right IJ vein Port-A-Cath explant.   08/26/2019 Tumor Marker   Patient's tumor was tested for the following markers: CA-125. Results of the tumor marker test revealed 4.9   10/03/2019 Imaging   1. New tumor deposits along the capsular surfaces of the liver, spleen, in the left pelvis, and right paracolic gutter, compatible with metastatic disease. 2. Mild dilation of the dorsal pancreatic duct in the pancreatic body and head, cause uncertain. 3. Nonobstructive left nephrolithiasis. 4. Aortic atherosclerosis.   Aortic Atherosclerosis (ICD10-I70.0   10/10/2019 Procedure   Successful placement of a right IJ approach Power Port with ultrasound and fluoroscopic guidance. The catheter is ready for use.     10/11/2019 Tumor Marker   Patient's tumor was tested for the following markers: CA-125 Results of the tumor marker test revealed 19.6.   10/14/2019 -  Chemotherapy   The patient had carboplatin and gemzar for chemotherapy treatment.     11/04/2019 Tumor Marker   Patient's tumor was tested for the following markers: CA-125 Results of the  tumor marker test revealed 11.4   11/28/2019 Tumor Marker   Patient's tumor was tested for the following markers: CA-125 Results of the tumor marker test revealed 5.8   12/20/2019 Imaging   1. Significant interval reduction in mixed solid and cystic nodule in the vicinity of the left ovary, with significant improvement or resolution of multiple pelvic, peritoneal, and organ capsule implants. Resolution of previously seen small volume ascites. Findings are consistent with treatment response of abdominal metastatic disease. 2. Status post hysterectomy, oophorectomy, and omentectomy. 3. Nonobstructive left nephrolithiasis. 4. Aortic Atherosclerosis (ICD10-I70.0).   12/23/2019 Tumor Marker   Patient's tumor was tested for the following markers: CA-125 Results of the tumor marker test revealed 5.0   01/24/2020 Tumor Marker   Patient's tumor was tested for the following markers: CA-125 Results of the tumor marker test revealed 4.8   02/24/2020 Tumor Marker   Patient's tumor was tested for the following markers: CA-125. Results of the tumor marker test revealed 4.3.   03/06/2020 Imaging   1. Status post hysterectomy, bilateral oophorectomy, and omentectomy. 2. Resolution of previously described left adnexal nodularity. 3. No residual disease identified. 4.  Aortic Atherosclerosis (ICD10-I70.0). 5. Left nephrolithiasis.   04/20/2020 Tumor Marker   Patient's  tumor was tested for the following markers: CA-125 Results of the tumor marker test revealed 4.6   05/18/2020 Tumor Marker   Patient's tumor was tested for the following markers: CA-125 Results of the tumor marker test revealed 4.7   05/29/2020 Imaging   Status post hysterectomy and bilateral salpingo oophorectomy.   No evidence of recurrent or metastatic disease.   06/08/2020 - 09/13/2021 Chemotherapy   She was on niraparib       06/17/2020 Tumor Marker   Patient's tumor was tested for the following markers: CA-125 Results of the  tumor marker test revealed 4.5   08/04/2020 Tumor Marker   Patient's tumor was tested for the following markers: CA-125. Results of the tumor marker test revealed 5.7   09/07/2020 Imaging   1. Status post hysterectomy and bilateral salpingo oophorectomy. No evidence of recurrent or metastatic disease. 2. Nonobstructing left nephrolithiasis. 3. Left-sided colonic diverticulosis without findings of acute diverticulitis. 4. Aortic atherosclerosis.     09/07/2020 Tumor Marker   Patient's tumor was tested for the following markers: CA-125 Results of the tumor marker test revealed 4.8.   10/21/2020 Tumor Marker   Patient's tumor was tested for the following markers: CA-125. Results of the tumor marker test revealed 5.1.   12/14/2020 Tumor Marker   Patient's tumor was tested for the following markers: CA-125. Results of the tumor marker test revealed 5.5.   04/05/2021 Tumor Marker   Patient's tumor was tested for the following markers: CA-125. Results of the tumor marker test revealed 5.9.   05/31/2021 Tumor Marker   Patient's tumor was tested for the following markers: CA-125. Results of the tumor marker test revealed 8.1.   09/13/2021 Imaging   1. Interval development of multiple new cystic and solid lesions in the abdomen and pelvis measuring up to 10.3 x 7.2 cm and consistent with peritoneal metastatic disease. 2. Nonobstructing left renal stones. 3. Aortic Atherosclerosis (ICD10-I70.0).     09/14/2021 Tumor Marker   Patient's tumor was tested for the following markers: CA-125. Results of the tumor marker test revealed 28.5.   09/16/2021 Echocardiogram   1. Left ventricular ejection fraction, by estimation, is 60 to 65%. The left ventricle has normal function. The left ventricle has no regional wall motion abnormalities. Left ventricular diastolic parameters are consistent with Grade I diastolic dysfunction (impaired relaxation).  2. Right ventricular systolic function is normal. The  right ventricular size is normal.  3. The mitral valve is normal in structure. Trivial mitral valve regurgitation. No evidence of mitral stenosis.  4. The aortic valve has an indeterminant number of cusps. Aortic valve regurgitation is trivial. No aortic stenosis is present.  5. The inferior vena cava is normal in size with greater than 50% respiratory variability, suggesting right atrial pressure of 3 mmHg.     09/20/2021 - 01/17/2022 Chemotherapy   Patient is on Treatment Plan : OVARIAN RECURRENT Liposomal Doxorubicin + Carboplatin q28d X 6 Cycles     09/20/2021 - 02/22/2022 Chemotherapy   Patient is on Treatment Plan : OVARIAN RECURRENT Liposomal Doxorubicin + Carboplatin q28d X 6 Cycles     09/23/2021 Tumor Marker   Patient's tumor was tested for the following markers: CA-125. Results of the tumor marker test revealed 27.7.   11/24/2021 Tumor Marker   Patient's tumor was tested for the following markers: CA-125. Results of the tumor marker test revealed 28.4.   12/13/2021 Imaging   1. Overall stable peritoneal carcinomatosis. No new disease is demonstrated. 2. Moderate left-sided hydroureteronephrosis likely  due to compression of the left ureter by the left pelvic mass. 3. Stable left-sided renal calculi. 4. Numerous left renal calculi.     12/22/2021 Tumor Marker   Patient's tumor was tested for the following markers: CA-125. Results of the tumor marker test revealed 24.7.   01/18/2022 Tumor Marker   Patient's tumor was tested for the following markers: CA-125. Results of the tumor marker test revealed 9.9.   01/27/2022 Echocardiogram    1. Left ventricular ejection fraction, by estimation, is 60 to 65%. The left ventricle has normal function. The left ventricle has no regional wall motion abnormalities. Left ventricular diastolic parameters are consistent with Grade I diastolic dysfunction (impaired relaxation). The average left ventricular global longitudinal strain is -21.1 %. The global  longitudinal strain is normal.  2. Right ventricular systolic function is normal. The right ventricular size is normal.  3. The mitral valve is abnormal. Mild mitral valve regurgitation.  4. The aortic valve is tricuspid. Aortic valve regurgitation is trivial.  5. The inferior vena cava is normal in size with greater than 50% respiratory variability, suggesting right atrial pressure of 3 mmHg.   02/23/2022 Tumor Marker   Patient's tumor was tested for the following markers: CA-125. Results of the tumor marker test revealed 8.5.   03/22/2022 -  Chemotherapy   Patient is on Treatment Plan : Ovarian Bevacizumab q21d       PHYSICAL EXAMINATION: ECOG PERFORMANCE STATUS: 1 - Symptomatic but completely ambulatory  Vitals:   03/08/22 0828  BP: (!) 161/83  Pulse: 90  Resp: 18  Temp: 97.6 F (36.4 C)  SpO2: 100%   Filed Weights   03/08/22 0828  Weight: 142 lb 12.8 oz (64.8 kg)    GENERAL:alert, no distress and comfortable NEURO: alert & oriented x 3 with fluent speech, no focal motor/sensory deficits  LABORATORY DATA:  I have reviewed the data as listed    Component Value Date/Time   NA 139 02/22/2022 0747   K 4.0 02/22/2022 0747   CL 109 02/22/2022 0747   CO2 26 02/22/2022 0747   GLUCOSE 102 (H) 02/22/2022 0747   BUN 7 (L) 02/22/2022 0747   CREATININE 0.49 02/22/2022 0747   CALCIUM 8.7 (L) 02/22/2022 0747   PROT 5.8 (L) 02/22/2022 0747   ALBUMIN 4.0 02/22/2022 0747   AST 35 02/22/2022 0747   ALT 24 02/22/2022 0747   ALKPHOS 79 02/22/2022 0747   BILITOT 1.2 02/22/2022 0747   GFRNONAA >60 02/22/2022 0747   GFRAA >60 02/24/2020 0852    No results found for: "SPEP", "UPEP"  Lab Results  Component Value Date   WBC 2.5 (L) 03/08/2022   NEUTROABS 1.6 (L) 03/08/2022   HGB 10.9 (L) 03/08/2022   HCT 31.0 (L) 03/08/2022   MCV 100.3 (H) 03/08/2022   PLT 72 (L) 03/08/2022      Chemistry      Component Value Date/Time   NA 139 02/22/2022 0747   K 4.0 02/22/2022 0747    CL 109 02/22/2022 0747   CO2 26 02/22/2022 0747   BUN 7 (L) 02/22/2022 0747   CREATININE 0.49 02/22/2022 0747      Component Value Date/Time   CALCIUM 8.7 (L) 02/22/2022 0747   ALKPHOS 79 02/22/2022 0747   AST 35 02/22/2022 0747   ALT 24 02/22/2022 0747   BILITOT 1.2 02/22/2022 0747

## 2022-03-08 NOTE — Assessment & Plan Note (Signed)
This is due to recent treatment She does not need transfusion support

## 2022-03-08 NOTE — Assessment & Plan Note (Signed)
She has uncontrolled hypertension due to bevacizumab and recent stress I recommend changing her blood pressure medications We will call her in a few days to check on her

## 2022-03-08 NOTE — Assessment & Plan Note (Signed)
She is very symptomatic from acute sinus infection I recommend low-dose prednisone along with a course of antibiotics

## 2022-03-09 ENCOUNTER — Other Ambulatory Visit: Payer: Self-pay

## 2022-03-11 ENCOUNTER — Telehealth: Payer: Self-pay

## 2022-03-11 NOTE — Telephone Encounter (Signed)
Stacy Rivas called back to give readings.  10/17 pm bp 148/90, HR 58 10/18 am bp 148/84, HR 64,  Repeated am bp 133/82, HR 59 PM BP 133/80, hr 72 10/19 am bp 136/89, HR 73 PM bp 135/81, HR 54 10/20 am bp 140/84, hr 59

## 2022-03-11 NOTE — Telephone Encounter (Signed)
-----   Message from [name] Lark, MD sent at 03/11/2022  8:15 AM EDT ----- Can you call and inquire about her BP>?

## 2022-03-11 NOTE — Telephone Encounter (Signed)
Called and given below message. She will call back with bp readings. She is not at home now.

## 2022-03-14 ENCOUNTER — Telehealth: Payer: Self-pay

## 2022-03-14 ENCOUNTER — Encounter: Payer: Self-pay | Admitting: Hematology and Oncology

## 2022-03-14 NOTE — Telephone Encounter (Signed)
Returned her call. She is still having sinus issues and is going to follow up with PCP. She is asking about referral to Dr. Benjamine Mola sent in August. Called Dr. Benjamine Mola office to check on referral and they said they have tried calling [place] x2 to schedule appt. Given Dr. Benjamine Mola phone # to [place] and she will call to schedule appt with Dr. Benjamine Mola.  Just FYI

## 2022-03-14 NOTE — Telephone Encounter (Signed)
Just saw this Much better No change in her BP meds

## 2022-03-14 NOTE — Telephone Encounter (Signed)
Called and given below message. She verbalized understanding. 

## 2022-03-16 ENCOUNTER — Other Ambulatory Visit: Payer: Self-pay | Admitting: Internal Medicine

## 2022-03-16 DIAGNOSIS — Z1231 Encounter for screening mammogram for malignant neoplasm of breast: Secondary | ICD-10-CM

## 2022-03-17 ENCOUNTER — Ambulatory Visit (HOSPITAL_COMMUNITY)
Admission: RE | Admit: 2022-03-17 | Discharge: 2022-03-17 | Disposition: A | Discharge status: Home / Self Care | Payer: BC Managed Care – PPO | Source: Ambulatory Visit | Attending: Hematology and Oncology | Admitting: Hematology and Oncology

## 2022-03-17 DIAGNOSIS — C562 Malignant neoplasm of left ovary: Secondary | ICD-10-CM | POA: Diagnosis not present

## 2022-03-17 DIAGNOSIS — C569 Malignant neoplasm of unspecified ovary: Secondary | ICD-10-CM | POA: Diagnosis not present

## 2022-03-17 DIAGNOSIS — N200 Calculus of kidney: Secondary | ICD-10-CM | POA: Diagnosis not present

## 2022-03-17 MED ORDER — SODIUM CHLORIDE (PF) 0.9 % IJ SOLN
INTRAMUSCULAR | Status: AC
  Filled 2022-03-17: qty 50

## 2022-03-17 MED ORDER — IOHEXOL 9 MG/ML PO SOLN
ORAL | Status: AC
  Filled 2022-03-17: qty 1000

## 2022-03-17 MED ORDER — IOHEXOL 9 MG/ML PO SOLN
1000.0000 mL | ORAL | Status: AC
  Administered 2022-03-17: 1000 mL via ORAL

## 2022-03-17 MED ORDER — IOHEXOL 300 MG/ML  SOLN
100.0000 mL | Freq: Once | INTRAMUSCULAR | Status: AC | PRN
  Administered 2022-03-17: 100 mL via INTRAVENOUS

## 2022-03-22 ENCOUNTER — Inpatient Hospital Stay (HOSPITAL_BASED_OUTPATIENT_CLINIC_OR_DEPARTMENT_OTHER): Payer: BC Managed Care – PPO | Admitting: Hematology and Oncology

## 2022-03-22 ENCOUNTER — Other Ambulatory Visit: Payer: Self-pay

## 2022-03-22 ENCOUNTER — Encounter: Payer: Self-pay | Admitting: Hematology and Oncology

## 2022-03-22 ENCOUNTER — Inpatient Hospital Stay: Payer: BC Managed Care – PPO

## 2022-03-22 ENCOUNTER — Other Ambulatory Visit (HOSPITAL_COMMUNITY): Payer: Self-pay

## 2022-03-22 VITALS — BP 151/78 | HR 96 | Resp 18 | Ht 62.0 in | Wt 138.6 lb

## 2022-03-22 VITALS — BP 141/77 | HR 77 | Temp 98.4°F | Resp 16

## 2022-03-22 DIAGNOSIS — C786 Secondary malignant neoplasm of retroperitoneum and peritoneum: Secondary | ICD-10-CM

## 2022-03-22 DIAGNOSIS — F411 Generalized anxiety disorder: Secondary | ICD-10-CM | POA: Diagnosis not present

## 2022-03-22 DIAGNOSIS — C562 Malignant neoplasm of left ovary: Secondary | ICD-10-CM

## 2022-03-22 DIAGNOSIS — T50905A Adverse effect of unspecified drugs, medicaments and biological substances, initial encounter: Secondary | ICD-10-CM

## 2022-03-22 DIAGNOSIS — Z7189 Other specified counseling: Secondary | ICD-10-CM

## 2022-03-22 DIAGNOSIS — Z5111 Encounter for antineoplastic chemotherapy: Secondary | ICD-10-CM | POA: Diagnosis not present

## 2022-03-22 DIAGNOSIS — Z5112 Encounter for antineoplastic immunotherapy: Secondary | ICD-10-CM | POA: Diagnosis not present

## 2022-03-22 DIAGNOSIS — Z79899 Other long term (current) drug therapy: Secondary | ICD-10-CM | POA: Diagnosis not present

## 2022-03-22 DIAGNOSIS — I158 Other secondary hypertension: Secondary | ICD-10-CM

## 2022-03-22 LAB — CBC WITH DIFFERENTIAL (CANCER CENTER ONLY)
Abs Immature Granulocytes: 0 10*3/uL (ref 0.00–0.07)
Basophils Absolute: 0 10*3/uL (ref 0.0–0.1)
Basophils Relative: 0 %
Eosinophils Absolute: 0 10*3/uL (ref 0.0–0.5)
Eosinophils Relative: 1 %
HCT: 35 % — ABNORMAL LOW (ref 36.0–46.0)
Hemoglobin: 12 g/dL (ref 12.0–15.0)
Immature Granulocytes: 0 %
Lymphocytes Relative: 27 %
Lymphs Abs: 1.1 10*3/uL (ref 0.7–4.0)
MCH: 35.5 pg — ABNORMAL HIGH (ref 26.0–34.0)
MCHC: 34.3 g/dL (ref 30.0–36.0)
MCV: 103.6 fL — ABNORMAL HIGH (ref 80.0–100.0)
Monocytes Absolute: 0.5 10*3/uL (ref 0.1–1.0)
Monocytes Relative: 12 %
Neutro Abs: 2.4 10*3/uL (ref 1.7–7.7)
Neutrophils Relative %: 60 %
Platelet Count: 220 10*3/uL (ref 150–400)
RBC: 3.38 MIL/uL — ABNORMAL LOW (ref 3.87–5.11)
RDW: 13.5 % (ref 11.5–15.5)
WBC Count: 3.9 10*3/uL — ABNORMAL LOW (ref 4.0–10.5)
nRBC: 0 % (ref 0.0–0.2)

## 2022-03-22 LAB — TOTAL PROTEIN, URINE DIPSTICK: Protein, ur: NEGATIVE mg/dL

## 2022-03-22 MED ORDER — HYDRALAZINE HCL 10 MG PO TABS
10.0000 mg | ORAL_TABLET | Freq: Two times a day (BID) | ORAL | 1 refills | Status: DC
  Filled 2022-03-22: qty 60, 30d supply, fill #0
  Filled 2022-04-25: qty 60, 30d supply, fill #1

## 2022-03-22 MED ORDER — ALTEPLASE 2 MG IJ SOLR
2.0000 mg | Freq: Once | INTRAMUSCULAR | Status: AC
  Administered 2022-03-22: 2 mg
  Filled 2022-03-22: qty 2

## 2022-03-22 MED ORDER — SODIUM CHLORIDE 0.9% FLUSH
10.0000 mL | INTRAVENOUS | Status: DC | PRN
  Administered 2022-03-22: 10 mL

## 2022-03-22 MED ORDER — SODIUM CHLORIDE 0.9% FLUSH
10.0000 mL | Freq: Once | INTRAVENOUS | Status: AC
  Administered 2022-03-22: 10 mL

## 2022-03-22 MED ORDER — HEPARIN SOD (PORK) LOCK FLUSH 100 UNIT/ML IV SOLN
500.0000 [IU] | Freq: Once | INTRAVENOUS | Status: AC | PRN
  Administered 2022-03-22: 500 [IU]

## 2022-03-22 MED ORDER — SODIUM CHLORIDE 0.9 % IV SOLN: Freq: Once | INTRAVENOUS | Status: AC

## 2022-03-22 MED ORDER — SODIUM CHLORIDE 0.9 % IV SOLN
10.0000 mg/kg | Freq: Once | INTRAVENOUS | Status: AC
  Administered 2022-03-22: 600 mg via INTRAVENOUS
  Filled 2022-03-22: qty 16

## 2022-03-22 NOTE — Progress Notes (Signed)
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[facility]) I have reviewed mild imaging studies with the patient She has excellent response to therapy We will transition her to maintenance bevacizumab I plan to repeat imaging study again in January  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 Her blood pressure is much better but she did not tolerate hydralazine well I recommend lowering the dose to 10 mg twice daily She will continue to check her blood pressure twice daily  Anxiety state She has lost some weight and she appears anxious I tried to reassure the patient   No orders of the defined types were placed in this encounter.   All questions were answered. The patient knows to call the clinic with any problems, questions or concerns. The total time spent in the appointment was 30 minutes encounter with patients including review of chart and various tests results, discussions about plan of care and coordination of care plan   [name] Lark, MD 03/22/2022 11:47 AM  INTERVAL HISTORY: Please see below for problem oriented charting. she returns for treatment follow-up and review of CT imaging results Since last time I saw her, she have lost some weight Her sinus congestion has improved Her blood pressure is now within normal range but she felt somewhat uncomfortable when she takes hydralazine in the evening  REVIEW OF SYSTEMS:   Constitutional: Denies fevers, chills or abnormal weight loss Eyes: Denies blurriness of vision Ears, nose, mouth, throat, and face: Denies mucositis or sore throat Respiratory: Denies cough, dyspnea or wheezes Cardiovascular: Denies palpitation, chest discomfort or lower extremity swelling Gastrointestinal:  Denies nausea, heartburn or change in bowel habits Skin: Denies abnormal skin rashes Lymphatics:  Denies new lymphadenopathy or easy bruising Neurological:Denies numbness, tingling or new weaknesses Behavioral/Psych: Mood is stable, no new changes  All other systems were reviewed with the patient and are negative.  I have reviewed the past medical history, past surgical history, social history and family history with the patient and they are unchanged from previous note.  ALLERGIES:  is allergic to skin adhesives [cyanoacrylate], shrimp [shellfish allergy], and sulfonamide derivatives.  MEDICATIONS:  Current Outpatient Medications  Medication Sig Dispense Refill   ALPRAZolam (XANAX) 0.25 MG tablet Take 1 tablet by mouth at bedtime as needed for anxiety. 30 tablet 0   carboxymethylcellulose (REFRESH PLUS) 0.5 % SOLN Place 1 drop into both eyes 2 (two) times daily as needed (dry eyes).     Cholecalciferol (VITAMIN D3) 25 MCG (1000 UT) CAPS Take 1,000 Units by mouth daily.      esomeprazole (NEXIUM) 20 MG capsule Take 20 mg by mouth daily.      estradiol (ESTRACE VAGINAL) 0.1 MG/GM vaginal cream Place 1 Applicatorful vaginally 3 (three) times a week. 42.5 g 12   furosemide (LASIX) 20 MG tablet Take 1 tablet (20 mg total) by mouth daily. 30 tablet 1   hydrALAZINE (APRESOLINE) 10 MG tablet Take 1 tablet (10 mg total) by mouth 2 (two) times daily. 60 tablet 1   lisinopril (ZESTRIL) 20 MG tablet Take 1 tablet (20 mg total) by mouth daily. 30 tablet 1   loratadine (CLARITIN) 10 MG tablet Take 10 mg by mouth daily as needed for allergies.     Multiple Vitamin (MULTIVITAMIN WITH MINERALS) TABS tablet Take 1 tablet by mouth daily.     ondansetron (ZOFRAN) 8 MG  tablet Take 1 tablet by mouth 2 times daily as needed. Start on the third day after chemotherapy. 30 tablet 1   prochlorperazine (COMPAZINE) 10 MG tablet Take 1 tablet by mouth every 6 hours as needed (Nausea or vomiting). 30 tablet 1   No current facility-administered medications for this visit.   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 14.7 cm poorly defined soft tissue mass in central pelvis suspicious for primary ovarian carcinoma, with uterine leiomyosarcoma considered a less likely differential diagnosis. 2. Moderate ascites and diffuse peritoneal carcinomatosis. 3. Several small uterine fibroids.   11/13/2018 Tumor Marker   Patient's tumor was tested for the following markers: CA-125 Results of the tumor marker test revealed 1434   11/21/2018 Procedure   Successful ultrasound-guided diagnostic and therapeutic paracentesis yielding 3.1 liters of peritoneal fluid   11/21/2018 Pathology Results   PERITONEAL/ASCITIC FLUID(SPECIMEN 1 OF 1 COLLECTED 11/21/18): ADENOCARCINOMA. Specimen Clinical Information Pelvic mass suspicious for ovarian cancer Source Peritoneal/Ascitic Fluid, (specimen 1 of 1 collected 11/21/18) Gross Specimen: Received is/are 1000 ccs of dark amber fluid. (CM:cm) Prepared: # Smears: 0 # Concentration Technique Slides (i.e. 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   11/29/2018 Initial Diagnosis   Ovarian cancer ([place])   11/29/2018 Cancer Staging   Staging form: Ovary, Fallopian Tube, and Primary Peritoneal Carcinoma, AJCC 8th Edition - Clinical: cT3, cN0, cM0 - Signed by [name] Lark, MD on 11/29/2018   12/03/2018 Procedure   Successful ultrasound-guided therapeutic paracentesis yielding 3.7 liters of peritoneal fluid.     12/06/2018 Procedure    Placement of a subcutaneous port device. Catheter tip at the SVC and right atrium junction   12/14/2018 Procedure   Successful ultrasound-guided paracentesis yielding 2.9 L of peritoneal fluid   12/28/2018 Tumor Marker   Patient's tumor was tested for the following markers: CA-125 Results of the tumor marker test revealed 812.   12/28/2018 - 04/22/2019 Chemotherapy   The patient had carboplatin and taxol for neoadjuvant treatment, followed by interval debulking surgery and subsequent adjuvant chemotherapy treatment.     02/01/2019 Imaging   Ct abdomen and pelvis 8.6 cm left ovarian mass, corresponding to the patient's known primary neoplasm, improved.   Mild peritoneal nodularity/omental caking, improved.   Small abdominopelvic ascites, improved.     02/01/2019 Tumor Marker   Patient's tumor was tested for the following markers: CA-125 Results of the tumor marker test revealed 183.   02/12/2019 Surgery   Preoperative Diagnosis: Stage IIIC ovarian cancer, s/p neoadjuvant chemotherapy      Procedure(s) Performed: 1. Exploratory laparotomy with total abdominal hysterectomy, bilateral salpingo-oophorectomy, omentectomy radical tumor debulking for ovarian cancer.   Surgeon: Thereasa Solo, MD.    Operative Findings: upper abdomen free of disease. No visible omental disease. Small volume (200cc) ascites. 8cm friable mass replacing left ovary and adherent to the sigmoid colon mesentery and ureter on the left.  Anterior fibroid. This represented  an optimal cytoreduction (R0) with no gross visible disease remaining.    02/12/2019 Pathology Results   A. OVARY AND FALLOPIAN TUBE, LEFT, SALPINGO-OOPHORECTOMY: - Serous carcinoma, high grade, status post neoadjuvant therapy - See oncology table and comment below B. UTERUS CERVIX WITH RIGHT FALLOPIAN TUBE AND OVARY, HYSTERECTOMY: Uterus: - Serosal surface involved by serous carcinoma - Endomyometrium uninvolved by carcinoma - Benign endometrial  polyp (4.1 cm) - Leiomyomata (5.5 cm; largest) - Adenomyosis Cervix: - Uninvolved by carcinoma Left ovary: - Serous carcinoma, high grade Left fallopian tube: - Serous carcinoma, high grade C. SOFT TISSUE, LEFT PELVIC SIDEWALL TUMOR, EXCISION: - Metastatic serous carcinoma, high-grade D. SOFT TISSUE, SIGMOID COLON MESENTERY, EXCISION: - Metastatic serous carcinoma, high-grade E. OMENTUM, TUMOR RESECTION: - Metastatic serous carcinoma, high-grade OVARY or FALLOPIAN TUBE or PRIMARY PERITONEUM: Procedure: Total hysterectomy and bilateral salpingo-oophorectomy, omentectomy and peritoneal biopsies Specimen Integrity: Fragmented Tumor Site: Left ovary and fallopian tube Ovarian Surface Involvement: Present Fallopian Tube Surface Involvement: Present Tumor Size: 6.3 cm (see comment) Histologic Type: Serous carcinoma Histologic Grade: High-grade Implants: Not applicable Other Tissue/ Organ Involvement: Right ovary, right fallopian tube, omentum, mesentery Largest Extrapelvic Peritoneal Focus: Macroscopic Peritoneal/Ascitic Fluid: Malignant Treatment Effect: No definite or minimal response identified (chemotherapy response score 1 [CRS1] Regional Lymph Nodes: No lymph nodes submitted or found Pathologic Stage Classification (pTNM, AJCC 8th Edition): pT3c, pNX Representative Tumor Block: A4 Comment(s): Additional testing (HER-2, MMR and MSI) are pending. The primary tumor site appears to be the left ovary and fallopian tube. The uterus is only involved on the serosal surface. There is tumor on the anterior peritoneal reflection.  Addendum: Tumor is Her2 negative,MSI stable   02/19/2019 Tumor Marker   Patient's tumor was tested for the following markers: CA-125 Results of the tumor marker test revealed 40.3   04/01/2019 Tumor Marker   Patient's tumor was tested for the following markers: CA-125 Results of the tumor marker test revealed 8.1    Genetic Testing   Negative testing. No  pathogenic variants identified on the [employer]. The report date is 04/17/2019.  Somatic genes analyzed through [facility]: ATM, BARD1, BRCA1, BRCA2, BRIP1, CHEK2, MRE11A, NBN, PALB2, RAD51C, RAD51D.  The [facility]Next gene panel offered by [employer] includes sequencing and rearrangement analysis for the following 36 genes: APC*, ATM*, AXIN2, BARD1, BMPR1A, BRCA1*, BRCA2*, BRIP1*, CDH1*, CDK4, CDKN2A, CHEK2*, DICER1, MLH1*, MSH2*, MSH3, MSH6*, MUTYH*, NBN, NF1*, NTHL1, PALB2*, PMS2*, PTEN*, RAD51C*, RAD51D*, RECQL, SMAD4, SMARCA4, STK11 and TP53* (sequencing and deletion/duplication); HOXB13, POLD1 and POLE (sequencing only); EPCAM and GREM1 (deletion/duplication only).    05/21/2019 Imaging   1. Interval hysterectomy, oophorectomy and omentectomy. No evidence of residual ovarian carcinoma in the pelvis. No evidence of peritoneal disease. No intraperitoneal free fluid. 2. Nonobstructing LEFT renal calculi.  Normal ureters.   05/22/2019 Tumor Marker   Patient's tumor was tested for the following markers: CA-125 Results of the tumor marker test revealed 5.7.   06/03/2019 Procedure   Successful right IJ vein Port-A-Cath explant.   08/26/2019 Tumor Marker   Patient's tumor was tested for the following markers: CA-125. Results of the tumor marker test revealed 4.9   10/03/2019 Imaging   1. New tumor deposits along the capsular surfaces of the liver, spleen, in the left pelvis, and right paracolic gutter, compatible with metastatic disease. 2. Mild dilation of the dorsal pancreatic duct in the pancreatic body and head, cause uncertain. 3. Nonobstructive left nephrolithiasis. 4. Aortic atherosclerosis.   Aortic Atherosclerosis (ICD10-I70.0   10/10/2019 Procedure  Successful placement of a right IJ approach Power Port with ultrasound and fluoroscopic guidance. The catheter is ready for use.     10/11/2019 Tumor Marker   Patient's tumor was tested for the following  markers: CA-125 Results of the tumor marker test revealed 19.6.   10/14/2019 -  Chemotherapy   The patient had carboplatin and gemzar for chemotherapy treatment.     11/04/2019 Tumor Marker   Patient's tumor was tested for the following markers: CA-125 Results of the tumor marker test revealed 11.4   11/28/2019 Tumor Marker   Patient's tumor was tested for the following markers: CA-125 Results of the tumor marker test revealed 5.8   12/20/2019 Imaging   1. Significant interval reduction in mixed solid and cystic nodule in the vicinity of the left ovary, with significant improvement or resolution of multiple pelvic, peritoneal, and organ capsule implants. Resolution of previously seen small volume ascites. Findings are consistent with treatment response of abdominal metastatic disease. 2. Status post hysterectomy, oophorectomy, and omentectomy. 3. Nonobstructive left nephrolithiasis. 4. Aortic Atherosclerosis (ICD10-I70.0).   12/23/2019 Tumor Marker   Patient's tumor was tested for the following markers: CA-125 Results of the tumor marker test revealed 5.0   01/24/2020 Tumor Marker   Patient's tumor was tested for the following markers: CA-125 Results of the tumor marker test revealed 4.8   02/24/2020 Tumor Marker   Patient's tumor was tested for the following markers: CA-125. Results of the tumor marker test revealed 4.3.   03/06/2020 Imaging   1. Status post hysterectomy, bilateral oophorectomy, and omentectomy. 2. Resolution of previously described left adnexal nodularity. 3. No residual disease identified. 4.  Aortic Atherosclerosis (ICD10-I70.0). 5. Left nephrolithiasis.   04/20/2020 Tumor Marker   Patient's tumor was tested for the following markers: CA-125 Results of the tumor marker test revealed 4.6   05/18/2020 Tumor Marker   Patient's tumor was tested for the following markers: CA-125 Results of the tumor marker test revealed 4.7   05/29/2020 Imaging   Status post  hysterectomy and bilateral salpingo oophorectomy.   No evidence of recurrent or metastatic disease.   06/08/2020 - 09/13/2021 Chemotherapy   She was on niraparib       06/17/2020 Tumor Marker   Patient's tumor was tested for the following markers: CA-125 Results of the tumor marker test revealed 4.5   08/04/2020 Tumor Marker   Patient's tumor was tested for the following markers: CA-125. Results of the tumor marker test revealed 5.7   09/07/2020 Imaging   1. Status post hysterectomy and bilateral salpingo oophorectomy. No evidence of recurrent or metastatic disease. 2. Nonobstructing left nephrolithiasis. 3. Left-sided colonic diverticulosis without findings of acute diverticulitis. 4. Aortic atherosclerosis.     09/07/2020 Tumor Marker   Patient's tumor was tested for the following markers: CA-125 Results of the tumor marker test revealed 4.8.   10/21/2020 Tumor Marker   Patient's tumor was tested for the following markers: CA-125. Results of the tumor marker test revealed 5.1.   12/14/2020 Tumor Marker   Patient's tumor was tested for the following markers: CA-125. Results of the tumor marker test revealed 5.5.   04/05/2021 Tumor Marker   Patient's tumor was tested for the following markers: CA-125. Results of the tumor marker test revealed 5.9.   05/31/2021 Tumor Marker   Patient's tumor was tested for the following markers: CA-125. Results of the tumor marker test revealed 8.1.   09/13/2021 Imaging   1. Interval development of multiple new cystic and solid lesions in  the abdomen and pelvis measuring up to 10.3 x 7.2 cm and consistent with peritoneal metastatic disease. 2. Nonobstructing left renal stones. 3. Aortic Atherosclerosis (ICD10-I70.0).     09/14/2021 Tumor Marker   Patient's tumor was tested for the following markers: CA-125. Results of the tumor marker test revealed 28.5.   09/16/2021 Echocardiogram   1. Left ventricular ejection fraction, by estimation, is 60  to 65%. The left ventricle has normal function. The left ventricle has no regional wall motion abnormalities. Left ventricular diastolic parameters are consistent with Grade I diastolic dysfunction (impaired relaxation).  2. Right ventricular systolic function is normal. The right ventricular size is normal.  3. The mitral valve is normal in structure. Trivial mitral valve regurgitation. No evidence of mitral stenosis.  4. The aortic valve has an indeterminant number of cusps. Aortic valve regurgitation is trivial. No aortic stenosis is present.  5. The inferior vena cava is normal in size with greater than 50% respiratory variability, suggesting right atrial pressure of 3 mmHg.     09/20/2021 - 01/17/2022 Chemotherapy   Patient is on Treatment Plan : OVARIAN RECURRENT Liposomal Doxorubicin + Carboplatin q28d X 6 Cycles     09/20/2021 - 02/22/2022 Chemotherapy   Patient is on Treatment Plan : OVARIAN RECURRENT Liposomal Doxorubicin + Carboplatin q28d X 6 Cycles     09/23/2021 Tumor Marker   Patient's tumor was tested for the following markers: CA-125. Results of the tumor marker test revealed 27.7.   11/24/2021 Tumor Marker   Patient's tumor was tested for the following markers: CA-125. Results of the tumor marker test revealed 28.4.   12/13/2021 Imaging   1. Overall stable peritoneal carcinomatosis. No new disease is demonstrated. 2. Moderate left-sided hydroureteronephrosis likely due to compression of the left ureter by the left pelvic mass. 3. Stable left-sided renal calculi. 4. Numerous left renal calculi.     12/22/2021 Tumor Marker   Patient's tumor was tested for the following markers: CA-125. Results of the tumor marker test revealed 24.7.   01/18/2022 Tumor Marker   Patient's tumor was tested for the following markers: CA-125. Results of the tumor marker test revealed 9.9.   01/27/2022 Echocardiogram    1. Left ventricular ejection fraction, by estimation, is 60 to 65%. The left  ventricle has normal function. The left ventricle has no regional wall motion abnormalities. Left ventricular diastolic parameters are consistent with Grade I diastolic dysfunction (impaired relaxation). The average left ventricular global longitudinal strain is -21.1 %. The global longitudinal strain is normal.  2. Right ventricular systolic function is normal. The right ventricular size is normal.  3. The mitral valve is abnormal. Mild mitral valve regurgitation.  4. The aortic valve is tricuspid. Aortic valve regurgitation is trivial.  5. The inferior vena cava is normal in size with greater than 50% respiratory variability, suggesting right atrial pressure of 3 mmHg.   02/23/2022 Tumor Marker   Patient's tumor was tested for the following markers: CA-125. Results of the tumor marker test revealed 8.5.   03/18/2022 Imaging   1. Diminished size of nodular and mixed solid and cystic peritoneal implants in the hepatorenal fossa, right lower quadrant, and left pelvis. 2. Unchanged solid mass in the right pelvis measuring 4.0 x 3.7 cm. 3. Constellation of findings is overall consistent with treatment response 4. Status post hysterectomy, oophorectomy, and omentectomy. 5. Nonobstructive left nephrolithiasis.   Aortic Atherosclerosis (ICD10-I70.0).     03/22/2022 -  Chemotherapy   Patient is on Treatment Plan : Ovarian  Bevacizumab q21d       PHYSICAL EXAMINATION: ECOG PERFORMANCE STATUS: 1 - Symptomatic but completely ambulatory  Vitals:   03/22/22 1013  BP: (!) 151/78  Pulse: 96  Resp: 18  SpO2: 99%   Filed Weights   03/22/22 1013  Weight: 138 lb 9.6 oz (62.9 kg)    GENERAL:alert, no distress and comfortable NEURO: alert & oriented x 3 with fluent speech, no focal motor/sensory deficits  LABORATORY DATA:  I have reviewed the data as listed    Component Value Date/Time   NA 139 02/22/2022 0747   K 4.0 02/22/2022 0747   CL 109 02/22/2022 0747   CO2 26 02/22/2022 0747    GLUCOSE 102 (H) 02/22/2022 0747   BUN 7 (L) 02/22/2022 0747   CREATININE 0.49 02/22/2022 0747   CALCIUM 8.7 (L) 02/22/2022 0747   PROT 5.8 (L) 02/22/2022 0747   ALBUMIN 4.0 02/22/2022 0747   AST 35 02/22/2022 0747   ALT 24 02/22/2022 0747   ALKPHOS 79 02/22/2022 0747   BILITOT 1.2 02/22/2022 0747   GFRNONAA >60 02/22/2022 0747   GFRAA >60 02/24/2020 0852    No results found for: "SPEP", "UPEP"  Lab Results  Component Value Date   WBC 3.9 (L) 03/22/2022   NEUTROABS 2.4 03/22/2022   HGB 12.0 03/22/2022   HCT 35.0 (L) 03/22/2022   MCV 103.6 (H) 03/22/2022   PLT 220 03/22/2022      Chemistry      Component Value Date/Time   NA 139 02/22/2022 0747   K 4.0 02/22/2022 0747   CL 109 02/22/2022 0747   CO2 26 02/22/2022 0747   BUN 7 (L) 02/22/2022 0747   CREATININE 0.49 02/22/2022 0747      Component Value Date/Time   CALCIUM 8.7 (L) 02/22/2022 0747   ALKPHOS 79 02/22/2022 0747   AST 35 02/22/2022 0747   ALT 24 02/22/2022 0747   BILITOT 1.2 02/22/2022 0747       RADIOGRAPHIC STUDIES: I have reviewed multiple imaging studies with the patient I have personally reviewed the radiological images as listed and agreed with the findings in the report. CT ABDOMEN PELVIS W CONTRAST  Result Date: 03/18/2022 CLINICAL DATA:  Ovarian cancer, assess treatment response * Tracking Code: BO * EXAM: CT ABDOMEN AND PELVIS WITH CONTRAST TECHNIQUE: Multidetector CT imaging of the abdomen and pelvis was performed using the standard protocol following bolus administration of intravenous contrast. RADIATION DOSE REDUCTION: This exam was performed according to the departmental dose-optimization program which includes automated exposure control, adjustment of the mA and/or kV according to patient size and/or use of iterative reconstruction technique. CONTRAST:  175m OMNIPAQUE IOHEXOL 300 MG/ML SOLN additional oral enteric contrast COMPARISON:  12/10/2021 FINDINGS: Lower chest: No acute abnormality.  Hepatobiliary: No solid liver abnormality is seen. No gallstones, gallbladder wall thickening, or biliary dilatation. Pancreas: Unremarkable. No pancreatic ductal dilatation or surrounding inflammatory changes. Spleen: Normal in size without significant abnormality. Adrenals/Urinary Tract: Adrenal glands are unremarkable. Numerous nonobstructive calculi of the superior pole of the left kidney. No right-sided calculi, ureteral calculi, or hydronephrosis. Bladder is unremarkable. Stomach/Bowel: Stomach is within normal limits. Appendix appears normal. No evidence of bowel wall thickening, distention, or inflammatory changes. Sigmoid diverticula. Status post omentectomy. Vascular/Lymphatic: Aortic atherosclerosis. No enlarged abdominal or pelvic lymph nodes. Reproductive: Status post hysterectomy and oophorectomy. Other: No abdominal wall hernia or abnormality. Diminished size of nodular implants in the hepatorenal recess measuring 1.6 x 1.0 cm, previously 3.1 x 1.9 cm (series 2, image  20) and 2.5 x 1.3 cm, previously 3.0 x 2.0 cm (series 2, image 19). Diminished size of a mixed solid and cystic implant in the right lower quadrant with small peripheral mural nodules, measuring 2.8 x 2.7 cm, previously 6.4 x 4.6 cm (series 2, image 51). Diminished size of a mixed solid and cystic implant in the left pelvis measuring 7.5 x 5.2 cm, previously 10.4 x 7.3 cm (series 2, image 66). Unchanged solid mass in the right pelvis measuring 4.0 x 3.7 cm (series 2, image 70). Musculoskeletal: No acute or significant osseous findings. IMPRESSION: 1. Diminished size of nodular and mixed solid and cystic peritoneal implants in the hepatorenal fossa, right lower quadrant, and left pelvis. 2. Unchanged solid mass in the right pelvis measuring 4.0 x 3.7 cm. 3. Constellation of findings is overall consistent with treatment response 4. Status post hysterectomy, oophorectomy, and omentectomy. 5. Nonobstructive left nephrolithiasis. Aortic  Atherosclerosis (ICD10-I70.0). Electronically Signed   By: Delanna Ahmadi M.D.   On: 03/18/2022 17:50

## 2022-03-22 NOTE — Patient Instructions (Signed)
[facility]  Discharge Instructions: Thank you for choosing [facility] to provide your oncology and hematology care.   If you have a lab appointment with the Cancer Center, please go directly to the Cancer Center and check in at the registration area.   Wear comfortable clothing and clothing appropriate for easy access to any Portacath or PICC line.   We strive to give you quality time with your provider. You may need to reschedule your appointment if you arrive late (15 or more minutes).  Arriving late affects you and other patients whose appointments are after yours.  Also, if you miss three or more appointments without notifying the office, you may be dismissed from the clinic at the provider's discretion.      For prescription refill requests, have your pharmacy contact our office and allow 72 hours for refills to be completed.    Today you received the following chemotherapy and/or immunotherapy agents bevacizumab      To help prevent nausea and vomiting after your treatment, we encourage you to take your nausea medication as directed.  BELOW ARE SYMPTOMS THAT SHOULD BE REPORTED IMMEDIATELY: *FEVER GREATER THAN 100.4 F (38 C) OR HIGHER *CHILLS OR SWEATING *NAUSEA AND VOMITING THAT IS NOT CONTROLLED WITH YOUR NAUSEA MEDICATION *UNUSUAL SHORTNESS OF BREATH *UNUSUAL BRUISING OR BLEEDING *URINARY PROBLEMS (pain or burning when urinating, or frequent urination) *BOWEL PROBLEMS (unusual diarrhea, constipation, pain near the anus) TENDERNESS IN MOUTH AND THROAT WITH OR WITHOUT PRESENCE OF ULCERS (sore throat, sores in mouth, or a toothache) UNUSUAL RASH, SWELLING OR PAIN  UNUSUAL VAGINAL DISCHARGE OR ITCHING   Items with * indicate a potential emergency and should be followed up as soon as possible or go to the Emergency Department if any problems should occur.  Please show the CHEMOTHERAPY ALERT CARD or IMMUNOTHERAPY ALERT CARD at check-in to  the Emergency Department and triage nurse.  Should you have questions after your visit or need to cancel or reschedule your appointment, please contact [facility]  Dept: [phone]  and follow the prompts.  Office hours are 8:00 a.m. to 4:30 p.m. Monday - Friday. Please note that voicemails left after 4:00 p.m. may not be returned until the following business day.  We are closed weekends and major holidays. You have access to a nurse at all times for urgent questions. Please call the main number to the clinic Dept: [phone] and follow the prompts.   For any non-urgent questions, you may also contact your provider using MyChart. We now offer e-Visits for anyone 18 and older to request care online for non-urgent symptoms. For details visit [email or web address].   Also download the MyChart app! Go to the app store, search "MyChart", open the app, select [place], and log in with your MyChart username and password.  Masks are optional in the cancer centers. If you would like for your care team to wear a mask while they are taking care of you, please let them know. You may have one support person who is at least 68 years old accompany you for your appointments. 

## 2022-03-22 NOTE — Assessment & Plan Note (Signed)
She has lost some weight and she appears anxious I tried to reassure the patient

## 2022-03-22 NOTE — Assessment & Plan Note (Signed)
Her blood pressure is much better but she did not tolerate hydralazine well I recommend lowering the dose to 10 mg twice daily She will continue to check her blood pressure twice daily

## 2022-03-22 NOTE — Assessment & Plan Note (Signed)
I have reviewed mild imaging studies with the patient She has excellent response to therapy We will transition her to maintenance bevacizumab I plan to repeat imaging study again in January

## 2022-03-22 NOTE — Progress Notes (Signed)
During port flush appointment patient did not have blood return. Cathflo was administered by flush room RN at [phone]. This nurse checked for blood return at 1017 with success. This nurse wasted and disposed 10cc of blood return.

## 2022-03-23 LAB — CA 125: Cancer Antigen (CA) 125: 7.1 U/mL (ref 0.0–38.1)

## 2022-03-24 ENCOUNTER — Ambulatory Visit
Admission: RE | Admit: 2022-03-24 | Discharge: 2022-03-24 | Disposition: A | Discharge status: Home / Self Care | Payer: BC Managed Care – PPO | Source: Ambulatory Visit | Attending: Internal Medicine | Admitting: Internal Medicine

## 2022-03-24 DIAGNOSIS — Z1231 Encounter for screening mammogram for malignant neoplasm of breast: Secondary | ICD-10-CM | POA: Diagnosis not present

## 2022-03-28 ENCOUNTER — Encounter: Payer: Self-pay | Admitting: Hematology and Oncology

## 2022-04-01 ENCOUNTER — Other Ambulatory Visit: Payer: Self-pay | Admitting: Hematology and Oncology

## 2022-04-01 ENCOUNTER — Other Ambulatory Visit (HOSPITAL_COMMUNITY): Payer: Self-pay

## 2022-04-01 ENCOUNTER — Telehealth: Payer: Self-pay

## 2022-04-01 ENCOUNTER — Encounter: Payer: Self-pay | Admitting: Hematology and Oncology

## 2022-04-01 DIAGNOSIS — U071 COVID-19: Secondary | ICD-10-CM | POA: Insufficient documentation

## 2022-04-01 MED ORDER — NIRMATRELVIR/RITONAVIR (PAXLOVID)TABLET
3.0000 | ORAL_TABLET | Freq: Two times a day (BID) | ORAL | 0 refills | Status: AC
  Filled 2022-04-01: qty 30, 5d supply, fill #0

## 2022-04-01 NOTE — Telephone Encounter (Signed)
Called and given below message. She verbalized understanding and will call the office back for questions/ concerns. Sent a scheduling message to rescheduled 11/21 appts.

## 2022-04-01 NOTE — Telephone Encounter (Signed)
Returned her call. She started having symptoms today and tested positive today for covid. She had a fever of 102 this am and respiratory symptoms. She is taking tylenol for fever, along with mucinex and robitussin for cough. Asking for antiviral and any recommendations? Will move out appts and send scheduling message to reschedule.

## 2022-04-01 NOTE — Telephone Encounter (Signed)
I sent prescription Paxlovid to [facility] Tell her most pharmacy does not stock it

## 2022-04-03 ENCOUNTER — Other Ambulatory Visit: Payer: Self-pay

## 2022-04-05 ENCOUNTER — Other Ambulatory Visit (HOSPITAL_COMMUNITY): Payer: Self-pay

## 2022-04-06 ENCOUNTER — Other Ambulatory Visit (HOSPITAL_COMMUNITY): Payer: Self-pay

## 2022-04-11 DIAGNOSIS — R051 Acute cough: Secondary | ICD-10-CM | POA: Diagnosis not present

## 2022-04-11 DIAGNOSIS — C562 Malignant neoplasm of left ovary: Secondary | ICD-10-CM | POA: Diagnosis not present

## 2022-04-12 ENCOUNTER — Ambulatory Visit: Payer: No Typology Code available for payment source | Admitting: Hematology and Oncology

## 2022-04-12 ENCOUNTER — Ambulatory Visit: Payer: No Typology Code available for payment source

## 2022-04-12 ENCOUNTER — Other Ambulatory Visit: Payer: No Typology Code available for payment source

## 2022-04-21 ENCOUNTER — Other Ambulatory Visit: Payer: Self-pay

## 2022-04-25 ENCOUNTER — Other Ambulatory Visit (HOSPITAL_COMMUNITY): Payer: Self-pay

## 2022-04-26 ENCOUNTER — Other Ambulatory Visit (HOSPITAL_COMMUNITY): Payer: Self-pay

## 2022-04-26 ENCOUNTER — Inpatient Hospital Stay (HOSPITAL_BASED_OUTPATIENT_CLINIC_OR_DEPARTMENT_OTHER): Payer: BLUE CROSS/BLUE SHIELD | Admitting: Hematology and Oncology

## 2022-04-26 ENCOUNTER — Inpatient Hospital Stay: Payer: BLUE CROSS/BLUE SHIELD | Attending: Gynecologic Oncology

## 2022-04-26 ENCOUNTER — Inpatient Hospital Stay: Payer: BLUE CROSS/BLUE SHIELD

## 2022-04-26 ENCOUNTER — Encounter: Payer: Self-pay | Admitting: Hematology and Oncology

## 2022-04-26 VITALS — BP 130/79 | HR 76 | Temp 98.4°F | Resp 18

## 2022-04-26 VITALS — BP 139/74 | HR 96 | Temp 98.3°F | Resp 18 | Ht 62.0 in | Wt 137.8 lb

## 2022-04-26 DIAGNOSIS — C786 Secondary malignant neoplasm of retroperitoneum and peritoneum: Secondary | ICD-10-CM | POA: Insufficient documentation

## 2022-04-26 DIAGNOSIS — Z7189 Other specified counseling: Secondary | ICD-10-CM

## 2022-04-26 DIAGNOSIS — C562 Malignant neoplasm of left ovary: Secondary | ICD-10-CM

## 2022-04-26 DIAGNOSIS — R030 Elevated blood-pressure reading, without diagnosis of hypertension: Secondary | ICD-10-CM

## 2022-04-26 DIAGNOSIS — Z79899 Other long term (current) drug therapy: Secondary | ICD-10-CM | POA: Insufficient documentation

## 2022-04-26 DIAGNOSIS — Z5112 Encounter for antineoplastic immunotherapy: Secondary | ICD-10-CM | POA: Diagnosis present

## 2022-04-26 LAB — CBC WITH DIFFERENTIAL (CANCER CENTER ONLY)
Abs Immature Granulocytes: 0.01 10*3/uL (ref 0.00–0.07)
Basophils Absolute: 0 10*3/uL (ref 0.0–0.1)
Basophils Relative: 0 %
Eosinophils Absolute: 0.1 10*3/uL (ref 0.0–0.5)
Eosinophils Relative: 1 %
HCT: 36.3 % (ref 36.0–46.0)
Hemoglobin: 12.8 g/dL (ref 12.0–15.0)
Immature Granulocytes: 0 %
Lymphocytes Relative: 21 %
Lymphs Abs: 1.3 10*3/uL (ref 0.7–4.0)
MCH: 35.5 pg — ABNORMAL HIGH (ref 26.0–34.0)
MCHC: 35.3 g/dL (ref 30.0–36.0)
MCV: 100.6 fL — ABNORMAL HIGH (ref 80.0–100.0)
Monocytes Absolute: 0.5 10*3/uL (ref 0.1–1.0)
Monocytes Relative: 8 %
Neutro Abs: 4.3 10*3/uL (ref 1.7–7.7)
Neutrophils Relative %: 70 %
Platelet Count: 212 10*3/uL (ref 150–400)
RBC: 3.61 MIL/uL — ABNORMAL LOW (ref 3.87–5.11)
RDW: 13.2 % (ref 11.5–15.5)
WBC Count: 6.1 10*3/uL (ref 4.0–10.5)
nRBC: 0 % (ref 0.0–0.2)

## 2022-04-26 LAB — TOTAL PROTEIN, URINE DIPSTICK: Protein, ur: NEGATIVE mg/dL

## 2022-04-26 MED ORDER — SODIUM CHLORIDE 0.9% FLUSH
10.0000 mL | Freq: Once | INTRAVENOUS | Status: AC
  Administered 2022-04-26: 10 mL

## 2022-04-26 MED ORDER — LISINOPRIL 20 MG PO TABS: 20.0000 mg | ORAL_TABLET | Freq: Every day | ORAL | 3 refills | Status: DC

## 2022-04-26 MED ORDER — PROCHLORPERAZINE MALEATE 10 MG PO TABS
10.0000 mg | ORAL_TABLET | Freq: Four times a day (QID) | ORAL | 1 refills | Status: DC | PRN
  Filled 2022-04-26: qty 30, 8d supply, fill #0

## 2022-04-26 MED ORDER — SODIUM CHLORIDE 0.9% FLUSH
10.0000 mL | INTRAVENOUS | Status: DC | PRN
  Administered 2022-04-26: 10 mL

## 2022-04-26 MED ORDER — ESTRADIOL 0.1 MG/GM VA CREA
1.0000 | TOPICAL_CREAM | VAGINAL | 12 refills | Status: DC
  Filled 2022-04-26: qty 42.5, 90d supply, fill #0

## 2022-04-26 MED ORDER — FUROSEMIDE 20 MG PO TABS: 20.0000 mg | ORAL_TABLET | Freq: Every day | ORAL | 1 refills | Status: DC

## 2022-04-26 MED ORDER — HEPARIN SOD (PORK) LOCK FLUSH 100 UNIT/ML IV SOLN
500.0000 [IU] | Freq: Once | INTRAVENOUS | Status: AC | PRN
  Administered 2022-04-26: 500 [IU]

## 2022-04-26 MED ORDER — LIDOCAINE-PRILOCAINE 2.5-2.5 % EX CREA
1.0000 | TOPICAL_CREAM | CUTANEOUS | 0 refills | Status: DC | PRN
  Filled 2022-04-26: qty 30, 10d supply, fill #0

## 2022-04-26 MED ORDER — SODIUM CHLORIDE 0.9 % IV SOLN
10.0000 mg/kg | Freq: Once | INTRAVENOUS | Status: AC
  Administered 2022-04-26: 600 mg via INTRAVENOUS
  Filled 2022-04-26: qty 16

## 2022-04-26 MED ORDER — SODIUM CHLORIDE 0.9 % IV SOLN: Freq: Once | INTRAVENOUS | Status: AC

## 2022-04-26 NOTE — Assessment & Plan Note (Signed)
Her last imaging showed excellent response to therapy We will transition her to maintenance bevacizumab I plan to repeat imaging study again in January

## 2022-04-26 NOTE — Patient Instructions (Signed)
[facility]  Discharge Instructions: Thank you for choosing [facility] to provide your oncology and hematology care.   If you have a lab appointment with the Cancer Center, please go directly to the Cancer Center and check in at the registration area.   Wear comfortable clothing and clothing appropriate for easy access to any Portacath or PICC line.   We strive to give you quality time with your provider. You may need to reschedule your appointment if you arrive late (15 or more minutes).  Arriving late affects you and other patients whose appointments are after yours.  Also, if you miss three or more appointments without notifying the office, you may be dismissed from the clinic at the provider's discretion.      For prescription refill requests, have your pharmacy contact our office and allow 72 hours for refills to be completed.    Today you received the following chemotherapy and/or immunotherapy agents bevacizumab      To help prevent nausea and vomiting after your treatment, we encourage you to take your nausea medication as directed.  BELOW ARE SYMPTOMS THAT SHOULD BE REPORTED IMMEDIATELY: *FEVER GREATER THAN 100.4 F (38 C) OR HIGHER *CHILLS OR SWEATING *NAUSEA AND VOMITING THAT IS NOT CONTROLLED WITH YOUR NAUSEA MEDICATION *UNUSUAL SHORTNESS OF BREATH *UNUSUAL BRUISING OR BLEEDING *URINARY PROBLEMS (pain or burning when urinating, or frequent urination) *BOWEL PROBLEMS (unusual diarrhea, constipation, pain near the anus) TENDERNESS IN MOUTH AND THROAT WITH OR WITHOUT PRESENCE OF ULCERS (sore throat, sores in mouth, or a toothache) UNUSUAL RASH, SWELLING OR PAIN  UNUSUAL VAGINAL DISCHARGE OR ITCHING   Items with * indicate a potential emergency and should be followed up as soon as possible or go to the Emergency Department if any problems should occur.  Please show the CHEMOTHERAPY ALERT CARD or IMMUNOTHERAPY ALERT CARD at check-in to  the Emergency Department and triage nurse.  Should you have questions after your visit or need to cancel or reschedule your appointment, please contact [facility]  Dept: [phone]  and follow the prompts.  Office hours are 8:00 a.m. to 4:30 p.m. Monday - Friday. Please note that voicemails left after 4:00 p.m. may not be returned until the following business day.  We are closed weekends and major holidays. You have access to a nurse at all times for urgent questions. Please call the main number to the clinic Dept: [phone] and follow the prompts.   For any non-urgent questions, you may also contact your provider using MyChart. We now offer e-Visits for anyone 18 and older to request care online for non-urgent symptoms. For details visit [email or web address].   Also download the MyChart app! Go to the app store, search "MyChart", open the app, select [facility], and log in with your MyChart username and password.  Masks are optional in the cancer centers. If you would like for your care team to wear a mask while they are taking care of you, please let them know. You may have one support person who is at least 68 years old accompany you for your appointments. 

## 2022-04-26 NOTE — Progress Notes (Signed)
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   All questions were answered. The patient knows to call the clinic with any problems, questions or concerns. The total time spent in the appointment was 20 minutes encounter with patients including review of chart and various tests results, discussions about plan of care and coordination of care plan   [name] Lark, MD 04/26/2022 11:26 AM  INTERVAL HISTORY: Please see below for problem oriented charting. she returns for treatment follow-up on maintenance bevacizumab Her documented blood pressure from home is within normal range She has no other new symptoms or side effects from bevacizumab  REVIEW OF SYSTEMS:   Constitutional: Denies fevers, chills or abnormal weight loss Eyes: Denies blurriness of vision Ears, nose, mouth, throat, and face: Denies mucositis or sore throat Respiratory: Denies cough, dyspnea or wheezes Cardiovascular: Denies palpitation, chest discomfort or lower extremity swelling Gastrointestinal:  Denies nausea, heartburn or change in bowel habits Skin: Denies abnormal skin rashes Lymphatics: Denies new lymphadenopathy or easy bruising Neurological:Denies numbness, tingling or new weaknesses Behavioral/Psych: Mood is stable, no new changes  All other systems were reviewed with the patient and are negative.  I have reviewed the past  medical history, past surgical history, social history and family history with the patient and they are unchanged from previous note.  ALLERGIES:  is allergic to skin adhesives [cyanoacrylate], shrimp [shellfish allergy], and sulfonamide derivatives.  MEDICATIONS:  Current Outpatient Medications  Medication Sig Dispense Refill   lidocaine-prilocaine (EMLA) cream Apply 1 Application topically as needed. 30 g 0   ALPRAZolam (XANAX) 0.25 MG tablet Take 1 tablet by mouth at bedtime as needed for anxiety. 30 tablet 0   carboxymethylcellulose (REFRESH PLUS) 0.5 % SOLN Place 1 drop into both eyes 2 (two) times daily as needed (dry eyes).     Cholecalciferol (VITAMIN D3) 25 MCG (1000 UT) CAPS Take 1,000 Units by mouth daily.      esomeprazole (NEXIUM) 20 MG capsule Take 20 mg by mouth daily.      [START ON 04/27/2022] estradiol (ESTRACE VAGINAL) 0.1 MG/GM vaginal cream Place 1 Applicatorful vaginally 3 (three) times a week. 42.5 g 12   furosemide (LASIX) 20 MG tablet Take 1 tablet (20 mg total) by mouth daily. 30 tablet 1   hydrALAZINE (APRESOLINE) 10 MG tablet Take 1 tablet (10 mg total) by mouth 2 (two) times daily. 60 tablet 1   lisinopril (ZESTRIL) 20 MG tablet Take 1 tablet (20 mg total) by mouth daily. 30 tablet 3   loratadine (CLARITIN) 10 MG tablet Take 10 mg by mouth daily as needed for allergies.     Multiple Vitamin (MULTIVITAMIN WITH MINERALS) TABS tablet Take 1 tablet by mouth daily.     ondansetron (ZOFRAN) 8 MG tablet Take 1 tablet by mouth 2 times daily as needed. Start on the third day after chemotherapy. 30 tablet 1   prochlorperazine (COMPAZINE) 10 MG tablet Take 1 tablet by mouth every  6 hours as needed (Nausea or vomiting). 30 tablet 1   No current facility-administered medications for this visit.   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 14.7 cm poorly defined soft tissue mass in central pelvis suspicious for primary ovarian carcinoma, with uterine leiomyosarcoma considered a less likely differential diagnosis. 2. Moderate ascites and diffuse peritoneal carcinomatosis. 3. Several small uterine fibroids.   11/13/2018 Tumor Marker   Patient's tumor was tested for the following markers: CA-125 Results of the tumor marker test revealed 1434   11/21/2018 Procedure   Successful ultrasound-guided diagnostic and therapeutic paracentesis yielding 3.1 liters of peritoneal fluid   11/21/2018 Pathology Results   PERITONEAL/ASCITIC FLUID(SPECIMEN 1 OF 1 COLLECTED 11/21/18): ADENOCARCINOMA. Specimen Clinical Information Pelvic mass suspicious for ovarian cancer Source Peritoneal/Ascitic Fluid, (specimen 1 of 1 collected 11/21/18) Gross Specimen: Received is/are 1000 ccs of dark amber fluid. (CM:cm) Prepared: # Smears: 0 # Concentration Technique Slides (i.e. 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   11/29/2018 Initial Diagnosis   Ovarian cancer ([place])   11/29/2018 Cancer Staging   Staging form: Ovary, Fallopian Tube, and Primary Peritoneal Carcinoma, AJCC 8th Edition - Clinical: cT3, cN0, cM0 - Signed by [name] Lark, MD on 11/29/2018   12/03/2018 Procedure   Successful ultrasound-guided therapeutic paracentesis yielding 3.7 liters of peritoneal fluid.     12/06/2018  Procedure   Placement of a subcutaneous port device. Catheter tip at the SVC and right atrium junction   12/14/2018 Procedure   Successful ultrasound-guided paracentesis yielding 2.9 L of peritoneal fluid   12/28/2018 Tumor Marker   Patient's tumor was tested for the following markers: CA-125 Results of the tumor marker test revealed 812.   12/28/2018 - 04/22/2019 Chemotherapy   The patient had carboplatin and taxol for neoadjuvant treatment, followed by interval debulking surgery and subsequent adjuvant chemotherapy treatment.     02/01/2019 Imaging   Ct abdomen and pelvis 8.6 cm left ovarian mass, corresponding to the patient's known primary neoplasm, improved.   Mild peritoneal nodularity/omental caking, improved.   Small abdominopelvic ascites, improved.     02/01/2019 Tumor Marker   Patient's tumor was tested for the following markers: CA-125 Results of the tumor marker test revealed 183.   02/12/2019 Surgery   Preoperative Diagnosis: Stage IIIC ovarian cancer, s/p neoadjuvant chemotherapy      Procedure(s) Performed: 1. Exploratory laparotomy with total abdominal hysterectomy, bilateral salpingo-oophorectomy, omentectomy radical tumor debulking for ovarian cancer.   Surgeon: Thereasa Solo, MD.    Operative Findings: upper abdomen free of disease. No visible omental disease. Small volume (200cc) ascites. 8cm friable mass replacing left ovary and adherent to the sigmoid colon mesentery and ureter on the left.  Anterior fibroid. This represented an optimal cytoreduction (R0)  with no gross visible disease remaining.    02/12/2019 Pathology Results   A. OVARY AND FALLOPIAN TUBE, LEFT, SALPINGO-OOPHORECTOMY: - Serous carcinoma, high grade, status post neoadjuvant therapy - See oncology table and comment below B. UTERUS CERVIX WITH RIGHT FALLOPIAN TUBE AND OVARY, HYSTERECTOMY: Uterus: - Serosal surface involved by serous carcinoma - Endomyometrium uninvolved by carcinoma - Benign  endometrial polyp (4.1 cm) - Leiomyomata (5.5 cm; largest) - Adenomyosis Cervix: - Uninvolved by carcinoma Left ovary: - Serous carcinoma, high grade Left fallopian tube: - Serous carcinoma, high grade C. SOFT TISSUE, LEFT PELVIC SIDEWALL TUMOR, EXCISION: - Metastatic serous carcinoma, high-grade D. SOFT TISSUE, SIGMOID COLON MESENTERY, EXCISION: - Metastatic serous carcinoma, high-grade E. OMENTUM, TUMOR RESECTION: - Metastatic serous carcinoma, high-grade OVARY or FALLOPIAN TUBE or PRIMARY PERITONEUM: Procedure: Total hysterectomy and bilateral salpingo-oophorectomy, omentectomy and peritoneal biopsies Specimen Integrity: Fragmented Tumor Site: Left ovary and fallopian tube Ovarian Surface Involvement: Present Fallopian Tube Surface Involvement: Present Tumor Size: 6.3 cm (see comment) Histologic Type: Serous carcinoma Histologic Grade: High-grade Implants: Not applicable Other Tissue/ Organ Involvement: Right ovary, right fallopian tube, omentum, mesentery Largest Extrapelvic Peritoneal Focus: Macroscopic Peritoneal/Ascitic Fluid: Malignant Treatment Effect: No definite or minimal response identified (chemotherapy response score 1 [CRS1] Regional Lymph Nodes: No lymph nodes submitted or found Pathologic Stage Classification (pTNM, AJCC 8th Edition): pT3c, pNX Representative Tumor Block: A4 Comment(s): Additional testing (HER-2, MMR and MSI) are pending. The primary tumor site appears to be the left ovary and fallopian tube. The uterus is only involved on the serosal surface. There is tumor on the anterior peritoneal reflection.  Addendum: Tumor is Her2 negative,MSI stable   02/19/2019 Tumor Marker   Patient's tumor was tested for the following markers: CA-125 Results of the tumor marker test revealed 40.3   04/01/2019 Tumor Marker   Patient's tumor was tested for the following markers: CA-125 Results of the tumor marker test revealed 8.1    Genetic Testing   Negative  testing. No pathogenic variants identified on the [employer]. The report date is 04/17/2019.  Somatic genes analyzed through [facility]: ATM, BARD1, BRCA1, BRCA2, BRIP1, CHEK2, MRE11A, NBN, PALB2, RAD51C, RAD51D.  The [employer]Next gene panel offered by [employer] includes sequencing and rearrangement analysis for the following 36 genes: APC*, ATM*, AXIN2, BARD1, BMPR1A, BRCA1*, BRCA2*, BRIP1*, CDH1*, CDK4, CDKN2A, CHEK2*, DICER1, MLH1*, MSH2*, MSH3, MSH6*, MUTYH*, NBN, NF1*, NTHL1, PALB2*, PMS2*, PTEN*, RAD51C*, RAD51D*, RECQL, SMAD4, SMARCA4, STK11 and TP53* (sequencing and deletion/duplication); HOXB13, POLD1 and POLE (sequencing only); EPCAM and GREM1 (deletion/duplication only).    05/21/2019 Imaging   1. Interval hysterectomy, oophorectomy and omentectomy. No evidence of residual ovarian carcinoma in the pelvis. No evidence of peritoneal disease. No intraperitoneal free fluid. 2. Nonobstructing LEFT renal calculi.  Normal ureters.   05/22/2019 Tumor Marker   Patient's tumor was tested for the following markers: CA-125 Results of the tumor marker test revealed 5.7.   06/03/2019 Procedure   Successful right IJ vein Port-A-Cath explant.   08/26/2019 Tumor Marker   Patient's tumor was tested for the following markers: CA-125. Results of the tumor marker test revealed 4.9   10/03/2019 Imaging   1. New tumor deposits along the capsular surfaces of the liver, spleen, in the left pelvis, and right paracolic gutter, compatible with metastatic disease. 2. Mild dilation of the dorsal pancreatic duct in the pancreatic body and head, cause uncertain. 3. Nonobstructive left nephrolithiasis. 4. Aortic atherosclerosis.   Aortic Atherosclerosis (ICD10-I70.0   10/10/2019 Procedure   Successful placement of  a right IJ approach Power Port with ultrasound and fluoroscopic guidance. The catheter is ready for use.     10/11/2019 Tumor Marker   Patient's tumor was tested for  the following markers: CA-125 Results of the tumor marker test revealed 19.6.   10/14/2019 -  Chemotherapy   The patient had carboplatin and gemzar for chemotherapy treatment.     11/04/2019 Tumor Marker   Patient's tumor was tested for the following markers: CA-125 Results of the tumor marker test revealed 11.4   11/28/2019 Tumor Marker   Patient's tumor was tested for the following markers: CA-125 Results of the tumor marker test revealed 5.8   12/20/2019 Imaging   1. Significant interval reduction in mixed solid and cystic nodule in the vicinity of the left ovary, with significant improvement or resolution of multiple pelvic, peritoneal, and organ capsule implants. Resolution of previously seen small volume ascites. Findings are consistent with treatment response of abdominal metastatic disease. 2. Status post hysterectomy, oophorectomy, and omentectomy. 3. Nonobstructive left nephrolithiasis. 4. Aortic Atherosclerosis (ICD10-I70.0).   12/23/2019 Tumor Marker   Patient's tumor was tested for the following markers: CA-125 Results of the tumor marker test revealed 5.0   01/24/2020 Tumor Marker   Patient's tumor was tested for the following markers: CA-125 Results of the tumor marker test revealed 4.8   02/24/2020 Tumor Marker   Patient's tumor was tested for the following markers: CA-125. Results of the tumor marker test revealed 4.3.   03/06/2020 Imaging   1. Status post hysterectomy, bilateral oophorectomy, and omentectomy. 2. Resolution of previously described left adnexal nodularity. 3. No residual disease identified. 4.  Aortic Atherosclerosis (ICD10-I70.0). 5. Left nephrolithiasis.   04/20/2020 Tumor Marker   Patient's tumor was tested for the following markers: CA-125 Results of the tumor marker test revealed 4.6   05/18/2020 Tumor Marker   Patient's tumor was tested for the following markers: CA-125 Results of the tumor marker test revealed 4.7   05/29/2020 Imaging    Status post hysterectomy and bilateral salpingo oophorectomy.   No evidence of recurrent or metastatic disease.   06/08/2020 - 09/13/2021 Chemotherapy   She was on niraparib       06/17/2020 Tumor Marker   Patient's tumor was tested for the following markers: CA-125 Results of the tumor marker test revealed 4.5   08/04/2020 Tumor Marker   Patient's tumor was tested for the following markers: CA-125. Results of the tumor marker test revealed 5.7   09/07/2020 Imaging   1. Status post hysterectomy and bilateral salpingo oophorectomy. No evidence of recurrent or metastatic disease. 2. Nonobstructing left nephrolithiasis. 3. Left-sided colonic diverticulosis without findings of acute diverticulitis. 4. Aortic atherosclerosis.     09/07/2020 Tumor Marker   Patient's tumor was tested for the following markers: CA-125 Results of the tumor marker test revealed 4.8.   10/21/2020 Tumor Marker   Patient's tumor was tested for the following markers: CA-125. Results of the tumor marker test revealed 5.1.   12/14/2020 Tumor Marker   Patient's tumor was tested for the following markers: CA-125. Results of the tumor marker test revealed 5.5.   04/05/2021 Tumor Marker   Patient's tumor was tested for the following markers: CA-125. Results of the tumor marker test revealed 5.9.   05/31/2021 Tumor Marker   Patient's tumor was tested for the following markers: CA-125. Results of the tumor marker test revealed 8.1.   09/13/2021 Imaging   1. Interval development of multiple new cystic and solid lesions in the abdomen and  pelvis measuring up to 10.3 x 7.2 cm and consistent with peritoneal metastatic disease. 2. Nonobstructing left renal stones. 3. Aortic Atherosclerosis (ICD10-I70.0).     09/14/2021 Tumor Marker   Patient's tumor was tested for the following markers: CA-125. Results of the tumor marker test revealed 28.5.   09/16/2021 Echocardiogram   1. Left ventricular ejection fraction, by  estimation, is 60 to 65%. The left ventricle has normal function. The left ventricle has no regional wall motion abnormalities. Left ventricular diastolic parameters are consistent with Grade I diastolic dysfunction (impaired relaxation).  2. Right ventricular systolic function is normal. The right ventricular size is normal.  3. The mitral valve is normal in structure. Trivial mitral valve regurgitation. No evidence of mitral stenosis.  4. The aortic valve has an indeterminant number of cusps. Aortic valve regurgitation is trivial. No aortic stenosis is present.  5. The inferior vena cava is normal in size with greater than 50% respiratory variability, suggesting right atrial pressure of 3 mmHg.     09/20/2021 - 01/17/2022 Chemotherapy   Patient is on Treatment Plan : OVARIAN RECURRENT Liposomal Doxorubicin + Carboplatin q28d X 6 Cycles     09/20/2021 - 02/22/2022 Chemotherapy   Patient is on Treatment Plan : OVARIAN RECURRENT Liposomal Doxorubicin + Carboplatin q28d X 6 Cycles     09/23/2021 Tumor Marker   Patient's tumor was tested for the following markers: CA-125. Results of the tumor marker test revealed 27.7.   11/24/2021 Tumor Marker   Patient's tumor was tested for the following markers: CA-125. Results of the tumor marker test revealed 28.4.   12/13/2021 Imaging   1. Overall stable peritoneal carcinomatosis. No new disease is demonstrated. 2. Moderate left-sided hydroureteronephrosis likely due to compression of the left ureter by the left pelvic mass. 3. Stable left-sided renal calculi. 4. Numerous left renal calculi.     12/22/2021 Tumor Marker   Patient's tumor was tested for the following markers: CA-125. Results of the tumor marker test revealed 24.7.   01/18/2022 Tumor Marker   Patient's tumor was tested for the following markers: CA-125. Results of the tumor marker test revealed 9.9.   01/27/2022 Echocardiogram    1. Left ventricular ejection fraction, by estimation, is 60 to  65%. The left ventricle has normal function. The left ventricle has no regional wall motion abnormalities. Left ventricular diastolic parameters are consistent with Grade I diastolic dysfunction (impaired relaxation). The average left ventricular global longitudinal strain is -21.1 %. The global longitudinal strain is normal.  2. Right ventricular systolic function is normal. The right ventricular size is normal.  3. The mitral valve is abnormal. Mild mitral valve regurgitation.  4. The aortic valve is tricuspid. Aortic valve regurgitation is trivial.  5. The inferior vena cava is normal in size with greater than 50% respiratory variability, suggesting right atrial pressure of 3 mmHg.   02/23/2022 Tumor Marker   Patient's tumor was tested for the following markers: CA-125. Results of the tumor marker test revealed 8.5.   03/18/2022 Imaging   1. Diminished size of nodular and mixed solid and cystic peritoneal implants in the hepatorenal fossa, right lower quadrant, and left pelvis. 2. Unchanged solid mass in the right pelvis measuring 4.0 x 3.7 cm. 3. Constellation of findings is overall consistent with treatment response 4. Status post hysterectomy, oophorectomy, and omentectomy. 5. Nonobstructive left nephrolithiasis.   Aortic Atherosclerosis (ICD10-I70.0).     03/22/2022 -  Chemotherapy   Patient is on Treatment Plan : Ovarian Bevacizumab q21d  03/24/2022 Tumor Marker   Patient's tumor was tested for the following markers: CA-125. Results of the tumor marker test revealed 7.1.     PHYSICAL EXAMINATION: ECOG PERFORMANCE STATUS: 0 - Asymptomatic  Vitals:   04/26/22 1045  BP: 139/74  Pulse: 96  Resp: 18  Temp: 98.3 F (36.8 C)  SpO2: 100%   Filed Weights   04/26/22 1045  Weight: 137 lb 12.8 oz (62.5 kg)    GENERAL:alert, no distress and comfortable  NEURO: alert & oriented x 3 with fluent speech, no focal motor/sensory deficits  LABORATORY DATA:  I have reviewed the  data as listed    Component Value Date/Time   NA 139 02/22/2022 0747   K 4.0 02/22/2022 0747   CL 109 02/22/2022 0747   CO2 26 02/22/2022 0747   GLUCOSE 102 (H) 02/22/2022 0747   BUN 7 (L) 02/22/2022 0747   CREATININE 0.49 02/22/2022 0747   CALCIUM 8.7 (L) 02/22/2022 0747   PROT 5.8 (L) 02/22/2022 0747   ALBUMIN 4.0 02/22/2022 0747   AST 35 02/22/2022 0747   ALT 24 02/22/2022 0747   ALKPHOS 79 02/22/2022 0747   BILITOT 1.2 02/22/2022 0747   GFRNONAA >60 02/22/2022 0747   GFRAA >60 02/24/2020 0852    No results found for: "SPEP", "UPEP"  Lab Results  Component Value Date   WBC 6.1 04/26/2022   NEUTROABS 4.3 04/26/2022   HGB 12.8 04/26/2022   HCT 36.3 04/26/2022   MCV 100.6 (H) 04/26/2022   PLT 212 04/26/2022      Chemistry      Component Value Date/Time   NA 139 02/22/2022 0747   K 4.0 02/22/2022 0747   CL 109 02/22/2022 0747   CO2 26 02/22/2022 0747   BUN 7 (L) 02/22/2022 0747   CREATININE 0.49 02/22/2022 0747      Component Value Date/Time   CALCIUM 8.7 (L) 02/22/2022 0747   ALKPHOS 79 02/22/2022 0747   AST 35 02/22/2022 0747   ALT 24 02/22/2022 0747   BILITOT 1.2 02/22/2022 0747

## 2022-04-26 NOTE — Assessment & Plan Note (Signed)
Her blood pressure is now near normal She will continue her current prescribed antihypertensives She can space out her furosemide to every other day

## 2022-04-27 ENCOUNTER — Other Ambulatory Visit: Payer: Self-pay

## 2022-04-28 LAB — CA 125: Cancer Antigen (CA) 125: 7 U/mL (ref 0.0–38.1)

## 2022-04-29 ENCOUNTER — Encounter: Payer: Self-pay | Admitting: Hematology and Oncology

## 2022-04-29 ENCOUNTER — Other Ambulatory Visit: Payer: Self-pay

## 2022-04-29 NOTE — Progress Notes (Signed)
The following biosimilar Vegzelma (bevacizumab-adcd)  has been selected for use in this patient.   Shay Bartoli D. Tamila Gaulin, PharmD  

## 2022-05-03 ENCOUNTER — Other Ambulatory Visit (HOSPITAL_COMMUNITY): Payer: Self-pay

## 2022-05-03 ENCOUNTER — Other Ambulatory Visit: Payer: Self-pay | Admitting: Hematology and Oncology

## 2022-05-03 MED ORDER — FUROSEMIDE 20 MG PO TABS
20.0000 mg | ORAL_TABLET | Freq: Every day | ORAL | 1 refills | Status: DC
  Filled 2022-05-03: qty 30, 30d supply, fill #0
  Filled 2022-06-04: qty 30, 30d supply, fill #1

## 2022-05-03 MED ORDER — LISINOPRIL 20 MG PO TABS
20.0000 mg | ORAL_TABLET | Freq: Every day | ORAL | 1 refills | Status: DC
  Filled 2022-05-03: qty 30, 30d supply, fill #0
  Filled 2022-06-04: qty 30, 30d supply, fill #1

## 2022-05-04 ENCOUNTER — Other Ambulatory Visit (HOSPITAL_COMMUNITY): Payer: Self-pay

## 2022-05-05 ENCOUNTER — Other Ambulatory Visit (HOSPITAL_COMMUNITY): Payer: Self-pay

## 2022-05-06 ENCOUNTER — Other Ambulatory Visit: Payer: Self-pay

## 2022-05-10 ENCOUNTER — Other Ambulatory Visit (HOSPITAL_COMMUNITY): Payer: Self-pay

## 2022-05-10 DIAGNOSIS — J310 Chronic rhinitis: Secondary | ICD-10-CM | POA: Diagnosis not present

## 2022-05-10 DIAGNOSIS — J343 Hypertrophy of nasal turbinates: Secondary | ICD-10-CM | POA: Diagnosis not present

## 2022-05-10 DIAGNOSIS — J342 Deviated nasal septum: Secondary | ICD-10-CM | POA: Diagnosis not present

## 2022-05-26 ENCOUNTER — Other Ambulatory Visit: Payer: Self-pay

## 2022-05-26 ENCOUNTER — Encounter: Payer: Self-pay | Admitting: Hematology and Oncology

## 2022-05-26 ENCOUNTER — Inpatient Hospital Stay: Payer: Medicare Other | Attending: Gynecologic Oncology

## 2022-05-26 ENCOUNTER — Inpatient Hospital Stay: Payer: Medicare Other

## 2022-05-26 ENCOUNTER — Other Ambulatory Visit (HOSPITAL_COMMUNITY): Payer: Self-pay

## 2022-05-26 ENCOUNTER — Inpatient Hospital Stay (HOSPITAL_BASED_OUTPATIENT_CLINIC_OR_DEPARTMENT_OTHER): Payer: Medicare Other | Admitting: Hematology and Oncology

## 2022-05-26 VITALS — BP 144/80 | HR 83 | Resp 18 | Ht 62.0 in | Wt 142.4 lb

## 2022-05-26 VITALS — Temp 98.4°F

## 2022-05-26 DIAGNOSIS — C786 Secondary malignant neoplasm of retroperitoneum and peritoneum: Secondary | ICD-10-CM | POA: Diagnosis not present

## 2022-05-26 DIAGNOSIS — Z7189 Other specified counseling: Secondary | ICD-10-CM | POA: Diagnosis not present

## 2022-05-26 DIAGNOSIS — C562 Malignant neoplasm of left ovary: Secondary | ICD-10-CM

## 2022-05-26 DIAGNOSIS — Z79899 Other long term (current) drug therapy: Secondary | ICD-10-CM | POA: Insufficient documentation

## 2022-05-26 DIAGNOSIS — C785 Secondary malignant neoplasm of large intestine and rectum: Secondary | ICD-10-CM | POA: Diagnosis not present

## 2022-05-26 DIAGNOSIS — Z5112 Encounter for antineoplastic immunotherapy: Secondary | ICD-10-CM | POA: Diagnosis present

## 2022-05-26 DIAGNOSIS — I10 Essential (primary) hypertension: Secondary | ICD-10-CM | POA: Diagnosis not present

## 2022-05-26 DIAGNOSIS — R252 Cramp and spasm: Secondary | ICD-10-CM

## 2022-05-26 DIAGNOSIS — J0110 Acute frontal sinusitis, unspecified: Secondary | ICD-10-CM

## 2022-05-26 LAB — CMP (CANCER CENTER ONLY)
ALT: 39 U/L (ref 0–44)
AST: 48 U/L — ABNORMAL HIGH (ref 15–41)
Albumin: 4.2 g/dL (ref 3.5–5.0)
Alkaline Phosphatase: 119 U/L (ref 38–126)
Anion gap: 7 (ref 5–15)
BUN: 12 mg/dL (ref 8–23)
CO2: 27 mmol/L (ref 22–32)
Calcium: 9.5 mg/dL (ref 8.9–10.3)
Chloride: 106 mmol/L (ref 98–111)
Creatinine: 0.54 mg/dL (ref 0.44–1.00)
GFR, Estimated: >60 mL/min (ref >60)
Glucose, Bld: 87 mg/dL (ref 70–99)
Potassium: 4.1 mmol/L (ref 3.5–5.1)
Sodium: 140 mmol/L (ref 135–145)
Total Bilirubin: 1.5 mg/dL — ABNORMAL HIGH (ref 0.3–1.2)
Total Protein: 6.3 g/dL — ABNORMAL LOW (ref 6.5–8.1)

## 2022-05-26 LAB — CBC WITH DIFFERENTIAL (CANCER CENTER ONLY)
Abs Immature Granulocytes: 0.01 10*3/uL (ref 0.00–0.07)
Basophils Absolute: 0 10*3/uL (ref 0.0–0.1)
Basophils Relative: 0 %
Eosinophils Absolute: 0.1 10*3/uL (ref 0.0–0.5)
Eosinophils Relative: 2 %
HCT: 39 % (ref 36.0–46.0)
Hemoglobin: 13.6 g/dL (ref 12.0–15.0)
Immature Granulocytes: 0 %
Lymphocytes Relative: 19 %
Lymphs Abs: 1.3 10*3/uL (ref 0.7–4.0)
MCH: 34.6 pg — ABNORMAL HIGH (ref 26.0–34.0)
MCHC: 34.9 g/dL (ref 30.0–36.0)
MCV: 99.2 fL (ref 80.0–100.0)
Monocytes Absolute: 0.6 10*3/uL (ref 0.1–1.0)
Monocytes Relative: 8 %
Neutro Abs: 4.7 10*3/uL (ref 1.7–7.7)
Neutrophils Relative %: 71 %
Platelet Count: 181 10*3/uL (ref 150–400)
RBC: 3.93 MIL/uL (ref 3.87–5.11)
RDW: 12.9 % (ref 11.5–15.5)
WBC Count: 6.8 10*3/uL (ref 4.0–10.5)
nRBC: 0 % (ref 0.0–0.2)

## 2022-05-26 LAB — TOTAL PROTEIN, URINE DIPSTICK: Protein, ur: NEGATIVE mg/dL

## 2022-05-26 MED ORDER — AZITHROMYCIN 500 MG PO TABS
500.0000 mg | ORAL_TABLET | Freq: Every day | ORAL | 0 refills | Status: DC
  Filled 2022-05-26: qty 3, 3d supply, fill #0

## 2022-05-26 MED ORDER — SODIUM CHLORIDE 0.9% FLUSH
10.0000 mL | Freq: Once | INTRAVENOUS | Status: AC
  Administered 2022-05-26: 10 mL

## 2022-05-26 MED ORDER — HEPARIN SOD (PORK) LOCK FLUSH 100 UNIT/ML IV SOLN
500.0000 [IU] | Freq: Once | INTRAVENOUS | Status: AC | PRN
  Administered 2022-05-26: 500 [IU]

## 2022-05-26 MED ORDER — SODIUM CHLORIDE 0.9 % IV SOLN
15.0000 mg/kg | Freq: Once | INTRAVENOUS | Status: AC
  Administered 2022-05-26: 900 mg via INTRAVENOUS
  Filled 2022-05-26: qty 32

## 2022-05-26 MED ORDER — SODIUM CHLORIDE 0.9% FLUSH
10.0000 mL | INTRAVENOUS | Status: DC | PRN
  Administered 2022-05-26: 10 mL

## 2022-05-26 NOTE — Progress Notes (Signed)
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[facility]) Her last imaging showed excellent response to therapy She tolerated bevacizumab well apart from mild the elevated blood pressure, she is not symptomatic I plan to repeat imaging study again before her next treatment We will proceed with treatment without delay  Essential hypertension Her blood pressure is elevated today likely due to whitecoat hypertension Her blood pressure at home is within normal range She will continue current antihypertensives  Sinus infection She has recurrent sinus infection I recommend a course of azithromycin She could benefit from influenza vaccination next visit  Leg cramps She has chronic intermittent leg cramps I will order CMP today  Orders Placed This Encounter  Procedures   CT ABDOMEN PELVIS W CONTRAST    Standing Status:   Future    Standing Expiration Date:   05/27/2023    Order Specific Question:   If indicated for the ordered procedure, I authorize the administration of contrast media per Radiology protocol    Answer:   Yes    Order Specific Question:   Preferred imaging location?    Answer:   [facility]    Order Specific Question:   Radiology Contrast Protocol - do NOT remove file path    Answer:   [email or web address]   CMP ([place] only)    Standing Status:   Future    Number of Occurrences:   1    Standing Expiration Date:   05/27/2023    All questions were answered. The patient knows to call the clinic with any problems, questions or concerns. The total time spent in the appointment was 30 minutes encounter with patients including review of chart and various tests results, discussions about plan of care and coordination of care plan   [name] Lark, MD 05/26/2022  9:41 AM  INTERVAL HISTORY: Please see below for problem oriented charting. she returns for treatment follow-up on maintenance bevacizumab She complained of intermittent leg cramps She also complained of persistent recurrent sinus congestion since December No fever or chills She denies abdominal pain or changes in bowel habits  REVIEW OF SYSTEMS:   Constitutional: Denies fevers, chills or abnormal weight loss Eyes: Denies blurriness of vision Ears, nose, mouth, throat, and face: Denies mucositis or sore throat Respiratory: Denies cough, dyspnea or wheezes Cardiovascular: Denies palpitation, chest discomfort or lower extremity swelling Gastrointestinal:  Denies nausea, heartburn or change in bowel habits Skin: Denies abnormal skin rashes Lymphatics: Denies new lymphadenopathy or easy bruising Neurological:Denies numbness, tingling or new weaknesses Behavioral/Psych: Mood is stable, no new changes  All other systems were reviewed with the patient and are negative.  I have reviewed the past medical history, past surgical history, social history and family history with the patient and they are unchanged from previous note.  ALLERGIES:  is allergic to skin adhesives [cyanoacrylate], shrimp [shellfish allergy], and sulfonamide derivatives.  MEDICATIONS:  Current Outpatient Medications  Medication Sig Dispense Refill   azithromycin (ZITHROMAX) 500 MG tablet Take 1 tablet (500 mg total) by mouth daily. 3 tablet 0   ALPRAZolam (XANAX) 0.25 MG tablet Take 1 tablet by mouth at bedtime as needed for anxiety. 30 tablet 0   carboxymethylcellulose (REFRESH PLUS) 0.5 % SOLN Place 1 drop into both eyes 2 (two) times daily as needed (dry eyes).     Cholecalciferol (VITAMIN D3)  25 MCG (1000 UT) CAPS Take 1,000 Units by mouth daily.      esomeprazole (NEXIUM) 20 MG capsule Take 20 mg by mouth daily.      estradiol (ESTRACE VAGINAL) 0.1 MG/GM vaginal cream Place 1 applicatorful vaginally 3 (three) times  a week. 42.5 g 12   furosemide (LASIX) 20 MG tablet Take 1 tablet (20 mg total) by mouth daily. 30 tablet 1   hydrALAZINE (APRESOLINE) 10 MG tablet Take 1 tablet (10 mg total) by mouth 2 (two) times daily. 60 tablet 1   lidocaine-prilocaine (EMLA) cream Apply topically as needed. 30 g 0   lisinopril (ZESTRIL) 20 MG tablet Take 1 tablet (20 mg total) by mouth daily. 30 tablet 1   loratadine (CLARITIN) 10 MG tablet Take 10 mg by mouth daily as needed for allergies.     Multiple Vitamin (MULTIVITAMIN WITH MINERALS) TABS tablet Take 1 tablet by mouth daily.     ondansetron (ZOFRAN) 8 MG tablet Take 1 tablet by mouth 2 times daily as needed. Start on the third day after chemotherapy. 30 tablet 1   prochlorperazine (COMPAZINE) 10 MG tablet Take 1 tablet by mouth every 6 hours as needed (Nausea or vomiting). 30 tablet 1   No current facility-administered medications for this visit.   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 14.7 cm poorly defined soft tissue mass in central pelvis suspicious for primary ovarian carcinoma, with uterine leiomyosarcoma considered a less likely differential diagnosis. 2. Moderate ascites and diffuse peritoneal carcinomatosis. 3. Several small uterine fibroids.   11/13/2018 Tumor Marker   Patient's tumor was tested for the following markers:  CA-125 Results of the tumor marker test revealed 1434   11/21/2018 Procedure   Successful ultrasound-guided diagnostic and therapeutic paracentesis yielding 3.1 liters of peritoneal fluid   11/21/2018 Pathology Results   PERITONEAL/ASCITIC FLUID(SPECIMEN 1 OF 1 COLLECTED 11/21/18): ADENOCARCINOMA. Specimen Clinical Information Pelvic mass suspicious for ovarian cancer Source Peritoneal/Ascitic Fluid, (specimen 1 of 1 collected 11/21/18) Gross Specimen: Received is/are 1000 ccs of dark amber fluid. (CM:cm) Prepared: # Smears: 0 # Concentration Technique Slides (i.e. 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   11/29/2018 Initial Diagnosis   Ovarian cancer ([place])   11/29/2018 Cancer Staging   Staging form: Ovary, Fallopian Tube, and Primary Peritoneal Carcinoma, AJCC 8th Edition - Clinical: cT3, cN0, cM0 - Signed by [name] Lark, MD on 11/29/2018   12/03/2018 Procedure   Successful ultrasound-guided therapeutic paracentesis yielding 3.7 liters of peritoneal fluid.     12/06/2018 Procedure   Placement of a subcutaneous port device. Catheter tip at the SVC and right atrium junction   12/14/2018 Procedure   Successful ultrasound-guided paracentesis yielding 2.9 L of peritoneal fluid   12/28/2018 Tumor Marker  Patient's tumor was tested for the following markers: CA-125 Results of the tumor marker test revealed 812.   12/28/2018 - 04/22/2019 Chemotherapy   The patient had carboplatin and taxol for neoadjuvant treatment, followed by interval debulking surgery and subsequent adjuvant chemotherapy treatment.     02/01/2019 Imaging   Ct abdomen and pelvis 8.6 cm left ovarian mass, corresponding to the patient's known primary neoplasm, improved.   Mild peritoneal nodularity/omental caking, improved.   Small abdominopelvic ascites, improved.     02/01/2019 Tumor Marker   Patient's tumor was tested for  the following markers: CA-125 Results of the tumor marker test revealed 183.   02/12/2019 Surgery   Preoperative Diagnosis: Stage IIIC ovarian cancer, s/p neoadjuvant chemotherapy      Procedure(s) Performed: 1. Exploratory laparotomy with total abdominal hysterectomy, bilateral salpingo-oophorectomy, omentectomy radical tumor debulking for ovarian cancer.   Surgeon: Thereasa Solo, MD.    Operative Findings: upper abdomen free of disease. No visible omental disease. Small volume (200cc) ascites. 8cm friable mass replacing left ovary and adherent to the sigmoid colon mesentery and ureter on the left.  Anterior fibroid. This represented an optimal cytoreduction (R0) with no gross visible disease remaining.    02/12/2019 Pathology Results   A. OVARY AND FALLOPIAN TUBE, LEFT, SALPINGO-OOPHORECTOMY: - Serous carcinoma, high grade, status post neoadjuvant therapy - See oncology table and comment below B. UTERUS CERVIX WITH RIGHT FALLOPIAN TUBE AND OVARY, HYSTERECTOMY: Uterus: - Serosal surface involved by serous carcinoma - Endomyometrium uninvolved by carcinoma - Benign endometrial polyp (4.1 cm) - Leiomyomata (5.5 cm; largest) - Adenomyosis Cervix: - Uninvolved by carcinoma Left ovary: - Serous carcinoma, high grade Left fallopian tube: - Serous carcinoma, high grade C. SOFT TISSUE, LEFT PELVIC SIDEWALL TUMOR, EXCISION: - Metastatic serous carcinoma, high-grade D. SOFT TISSUE, SIGMOID COLON MESENTERY, EXCISION: - Metastatic serous carcinoma, high-grade E. OMENTUM, TUMOR RESECTION: - Metastatic serous carcinoma, high-grade OVARY or FALLOPIAN TUBE or PRIMARY PERITONEUM: Procedure: Total hysterectomy and bilateral salpingo-oophorectomy, omentectomy and peritoneal biopsies Specimen Integrity: Fragmented Tumor Site: Left ovary and fallopian tube Ovarian Surface Involvement: Present Fallopian Tube Surface Involvement: Present Tumor Size: 6.3 cm (see comment) Histologic Type: Serous  carcinoma Histologic Grade: High-grade Implants: Not applicable Other Tissue/ Organ Involvement: Right ovary, right fallopian tube, omentum, mesentery Largest Extrapelvic Peritoneal Focus: Macroscopic Peritoneal/Ascitic Fluid: Malignant Treatment Effect: No definite or minimal response identified (chemotherapy response score 1 [CRS1] Regional Lymph Nodes: No lymph nodes submitted or found Pathologic Stage Classification (pTNM, AJCC 8th Edition): pT3c, pNX Representative Tumor Block: A4 Comment(s): Additional testing (HER-2, MMR and MSI) are pending. The primary tumor site appears to be the left ovary and fallopian tube. The uterus is only involved on the serosal surface. There is tumor on the anterior peritoneal reflection.  Addendum: Tumor is Her2 negative,MSI stable   02/19/2019 Tumor Marker   Patient's tumor was tested for the following markers: CA-125 Results of the tumor marker test revealed 40.3   04/01/2019 Tumor Marker   Patient's tumor was tested for the following markers: CA-125 Results of the tumor marker test revealed 8.1    Genetic Testing   Negative testing. No pathogenic variants identified on the [employer]. The report date is 04/17/2019.  Somatic genes analyzed through [facility]: ATM, BARD1, BRCA1, BRCA2, BRIP1, CHEK2, MRE11A, NBN, PALB2, RAD51C, RAD51D.  The [facility]Next gene panel offered by [employer] includes sequencing and rearrangement analysis for the following 36 genes: APC*, ATM*, AXIN2, BARD1, BMPR1A, BRCA1*, BRCA2*, BRIP1*, CDH1*, CDK4, CDKN2A, CHEK2*, DICER1, MLH1*,  MSH2*, MSH3, MSH6*, MUTYH*, NBN, NF1*, NTHL1, PALB2*, PMS2*, PTEN*, RAD51C*, RAD51D*, RECQL, SMAD4, SMARCA4, STK11 and TP53* (sequencing and deletion/duplication); HOXB13, POLD1 and POLE (sequencing only); EPCAM and GREM1 (deletion/duplication only).    05/21/2019 Imaging   1. Interval hysterectomy, oophorectomy and omentectomy. No evidence of residual ovarian  carcinoma in the pelvis. No evidence of peritoneal disease. No intraperitoneal free fluid. 2. Nonobstructing LEFT renal calculi.  Normal ureters.   05/22/2019 Tumor Marker   Patient's tumor was tested for the following markers: CA-125 Results of the tumor marker test revealed 5.7.   06/03/2019 Procedure   Successful right IJ vein Port-A-Cath explant.   08/26/2019 Tumor Marker   Patient's tumor was tested for the following markers: CA-125. Results of the tumor marker test revealed 4.9   10/03/2019 Imaging   1. New tumor deposits along the capsular surfaces of the liver, spleen, in the left pelvis, and right paracolic gutter, compatible with metastatic disease. 2. Mild dilation of the dorsal pancreatic duct in the pancreatic body and head, cause uncertain. 3. Nonobstructive left nephrolithiasis. 4. Aortic atherosclerosis.   Aortic Atherosclerosis (ICD10-I70.0   10/10/2019 Procedure   Successful placement of a right IJ approach Power Port with ultrasound and fluoroscopic guidance. The catheter is ready for use.     10/11/2019 Tumor Marker   Patient's tumor was tested for the following markers: CA-125 Results of the tumor marker test revealed 19.6.   10/14/2019 -  Chemotherapy   The patient had carboplatin and gemzar for chemotherapy treatment.     11/04/2019 Tumor Marker   Patient's tumor was tested for the following markers: CA-125 Results of the tumor marker test revealed 11.4   11/28/2019 Tumor Marker   Patient's tumor was tested for the following markers: CA-125 Results of the tumor marker test revealed 5.8   12/20/2019 Imaging   1. Significant interval reduction in mixed solid and cystic nodule in the vicinity of the left ovary, with significant improvement or resolution of multiple pelvic, peritoneal, and organ capsule implants. Resolution of previously seen small volume ascites. Findings are consistent with treatment response of abdominal metastatic disease. 2. Status post  hysterectomy, oophorectomy, and omentectomy. 3. Nonobstructive left nephrolithiasis. 4. Aortic Atherosclerosis (ICD10-I70.0).   12/23/2019 Tumor Marker   Patient's tumor was tested for the following markers: CA-125 Results of the tumor marker test revealed 5.0   01/24/2020 Tumor Marker   Patient's tumor was tested for the following markers: CA-125 Results of the tumor marker test revealed 4.8   02/24/2020 Tumor Marker   Patient's tumor was tested for the following markers: CA-125. Results of the tumor marker test revealed 4.3.   03/06/2020 Imaging   1. Status post hysterectomy, bilateral oophorectomy, and omentectomy. 2. Resolution of previously described left adnexal nodularity. 3. No residual disease identified. 4.  Aortic Atherosclerosis (ICD10-I70.0). 5. Left nephrolithiasis.   04/20/2020 Tumor Marker   Patient's tumor was tested for the following markers: CA-125 Results of the tumor marker test revealed 4.6   05/18/2020 Tumor Marker   Patient's tumor was tested for the following markers: CA-125 Results of the tumor marker test revealed 4.7   05/29/2020 Imaging   Status post hysterectomy and bilateral salpingo oophorectomy.   No evidence of recurrent or metastatic disease.   06/08/2020 - 09/13/2021 Chemotherapy   She was on niraparib       06/17/2020 Tumor Marker   Patient's tumor was tested for the following markers: CA-125 Results of the tumor marker test revealed 4.5   08/04/2020 Tumor Marker  Patient's tumor was tested for the following markers: CA-125. Results of the tumor marker test revealed 5.7   09/07/2020 Imaging   1. Status post hysterectomy and bilateral salpingo oophorectomy. No evidence of recurrent or metastatic disease. 2. Nonobstructing left nephrolithiasis. 3. Left-sided colonic diverticulosis without findings of acute diverticulitis. 4. Aortic atherosclerosis.     09/07/2020 Tumor Marker   Patient's tumor was tested for the following markers:  CA-125 Results of the tumor marker test revealed 4.8.   10/21/2020 Tumor Marker   Patient's tumor was tested for the following markers: CA-125. Results of the tumor marker test revealed 5.1.   12/14/2020 Tumor Marker   Patient's tumor was tested for the following markers: CA-125. Results of the tumor marker test revealed 5.5.   04/05/2021 Tumor Marker   Patient's tumor was tested for the following markers: CA-125. Results of the tumor marker test revealed 5.9.   05/31/2021 Tumor Marker   Patient's tumor was tested for the following markers: CA-125. Results of the tumor marker test revealed 8.1.   09/13/2021 Imaging   1. Interval development of multiple new cystic and solid lesions in the abdomen and pelvis measuring up to 10.3 x 7.2 cm and consistent with peritoneal metastatic disease. 2. Nonobstructing left renal stones. 3. Aortic Atherosclerosis (ICD10-I70.0).     09/14/2021 Tumor Marker   Patient's tumor was tested for the following markers: CA-125. Results of the tumor marker test revealed 28.5.   09/16/2021 Echocardiogram   1. Left ventricular ejection fraction, by estimation, is 60 to 65%. The left ventricle has normal function. The left ventricle has no regional wall motion abnormalities. Left ventricular diastolic parameters are consistent with Grade I diastolic dysfunction (impaired relaxation).  2. Right ventricular systolic function is normal. The right ventricular size is normal.  3. The mitral valve is normal in structure. Trivial mitral valve regurgitation. No evidence of mitral stenosis.  4. The aortic valve has an indeterminant number of cusps. Aortic valve regurgitation is trivial. No aortic stenosis is present.  5. The inferior vena cava is normal in size with greater than 50% respiratory variability, suggesting right atrial pressure of 3 mmHg.     09/20/2021 - 01/17/2022 Chemotherapy   Patient is on Treatment Plan : OVARIAN RECURRENT Liposomal Doxorubicin + Carboplatin  q28d X 6 Cycles     09/20/2021 - 02/22/2022 Chemotherapy   Patient is on Treatment Plan : OVARIAN RECURRENT Liposomal Doxorubicin + Carboplatin q28d X 6 Cycles     09/23/2021 Tumor Marker   Patient's tumor was tested for the following markers: CA-125. Results of the tumor marker test revealed 27.7.   11/24/2021 Tumor Marker   Patient's tumor was tested for the following markers: CA-125. Results of the tumor marker test revealed 28.4.   12/13/2021 Imaging   1. Overall stable peritoneal carcinomatosis. No new disease is demonstrated. 2. Moderate left-sided hydroureteronephrosis likely due to compression of the left ureter by the left pelvic mass. 3. Stable left-sided renal calculi. 4. Numerous left renal calculi.     12/22/2021 Tumor Marker   Patient's tumor was tested for the following markers: CA-125. Results of the tumor marker test revealed 24.7.   01/18/2022 Tumor Marker   Patient's tumor was tested for the following markers: CA-125. Results of the tumor marker test revealed 9.9.   01/27/2022 Echocardiogram    1. Left ventricular ejection fraction, by estimation, is 60 to 65%. The left ventricle has normal function. The left ventricle has no regional wall motion abnormalities. Left ventricular diastolic parameters  are consistent with Grade I diastolic dysfunction (impaired relaxation). The average left ventricular global longitudinal strain is -21.1 %. The global longitudinal strain is normal.  2. Right ventricular systolic function is normal. The right ventricular size is normal.  3. The mitral valve is abnormal. Mild mitral valve regurgitation.  4. The aortic valve is tricuspid. Aortic valve regurgitation is trivial.  5. The inferior vena cava is normal in size with greater than 50% respiratory variability, suggesting right atrial pressure of 3 mmHg.   02/23/2022 Tumor Marker   Patient's tumor was tested for the following markers: CA-125. Results of the tumor marker test revealed 8.5.    03/18/2022 Imaging   1. Diminished size of nodular and mixed solid and cystic peritoneal implants in the hepatorenal fossa, right lower quadrant, and left pelvis. 2. Unchanged solid mass in the right pelvis measuring 4.0 x 3.7 cm. 3. Constellation of findings is overall consistent with treatment response 4. Status post hysterectomy, oophorectomy, and omentectomy. 5. Nonobstructive left nephrolithiasis.   Aortic Atherosclerosis (ICD10-I70.0).     03/22/2022 -  Chemotherapy   Patient is on Treatment Plan : Ovarian Bevacizumab q21d     03/24/2022 Tumor Marker   Patient's tumor was tested for the following markers: CA-125. Results of the tumor marker test revealed 7.1.   04/28/2022 Tumor Marker   Patient's tumor was tested for the following markers: CA-125. Results of the tumor marker test revealed 7.0.     PHYSICAL EXAMINATION: ECOG PERFORMANCE STATUS: 1 - Symptomatic but completely ambulatory  Vitals:   05/26/22 0850  BP: (!) 144/80  Pulse: 83  Resp: 18  SpO2: 100%   Filed Weights   05/26/22 0850  Weight: 142 lb 6.4 oz (64.6 kg)    GENERAL:alert, no distress and comfortable NEURO: alert & oriented x 3 with fluent speech, no focal motor/sensory deficits  LABORATORY DATA:  I have reviewed the data as listed    Component Value Date/Time   NA 139 02/22/2022 0747   K 4.0 02/22/2022 0747   CL 109 02/22/2022 0747   CO2 26 02/22/2022 0747   GLUCOSE 102 (H) 02/22/2022 0747   BUN 7 (L) 02/22/2022 0747   CREATININE 0.49 02/22/2022 0747   CALCIUM 8.7 (L) 02/22/2022 0747   PROT 5.8 (L) 02/22/2022 0747   ALBUMIN 4.0 02/22/2022 0747   AST 35 02/22/2022 0747   ALT 24 02/22/2022 0747   ALKPHOS 79 02/22/2022 0747   BILITOT 1.2 02/22/2022 0747   GFRNONAA >60 02/22/2022 0747   GFRAA >60 02/24/2020 0852    No results found for: "SPEP", "UPEP"  Lab Results  Component Value Date   WBC 6.8 05/26/2022   NEUTROABS 4.7 05/26/2022   HGB 13.6 05/26/2022   HCT 39.0 05/26/2022    MCV 99.2 05/26/2022   PLT 181 05/26/2022      Chemistry      Component Value Date/Time   NA 139 02/22/2022 0747   K 4.0 02/22/2022 0747   CL 109 02/22/2022 0747   CO2 26 02/22/2022 0747   BUN 7 (L) 02/22/2022 0747   CREATININE 0.49 02/22/2022 0747      Component Value Date/Time   CALCIUM 8.7 (L) 02/22/2022 0747   ALKPHOS 79 02/22/2022 0747   AST 35 02/22/2022 0747   ALT 24 02/22/2022 0747   BILITOT 1.2 02/22/2022 0747

## 2022-05-26 NOTE — Assessment & Plan Note (Signed)
She has chronic intermittent leg cramps I will order CMP today

## 2022-05-26 NOTE — Assessment & Plan Note (Signed)
Her blood pressure is elevated today likely due to whitecoat hypertension Her blood pressure at home is within normal range She will continue current antihypertensives

## 2022-05-26 NOTE — Assessment & Plan Note (Signed)
Her last imaging showed excellent response to therapy She tolerated bevacizumab well apart from mild the elevated blood pressure, she is not symptomatic I plan to repeat imaging study again before her next treatment We will proceed with treatment without delay

## 2022-05-26 NOTE — Patient Instructions (Signed)
[facility]  Discharge Instructions: Thank you for choosing [facility] to provide your oncology and hematology care.   If you have a lab appointment with the Cancer Center, please go directly to the Cancer Center and check in at the registration area.   Wear comfortable clothing and clothing appropriate for easy access to any Portacath or PICC line.   We strive to give you quality time with your provider. You may need to reschedule your appointment if you arrive late (15 or more minutes).  Arriving late affects you and other patients whose appointments are after yours.  Also, if you miss three or more appointments without notifying the office, you may be dismissed from the clinic at the provider's discretion.      For prescription refill requests, have your pharmacy contact our office and allow 72 hours for refills to be completed.    Today you received the following chemotherapy and/or immunotherapy agents bevacizumab      To help prevent nausea and vomiting after your treatment, we encourage you to take your nausea medication as directed.  BELOW ARE SYMPTOMS THAT SHOULD BE REPORTED IMMEDIATELY: *FEVER GREATER THAN 100.4 F (38 C) OR HIGHER *CHILLS OR SWEATING *NAUSEA AND VOMITING THAT IS NOT CONTROLLED WITH YOUR NAUSEA MEDICATION *UNUSUAL SHORTNESS OF BREATH *UNUSUAL BRUISING OR BLEEDING *URINARY PROBLEMS (pain or burning when urinating, or frequent urination) *BOWEL PROBLEMS (unusual diarrhea, constipation, pain near the anus) TENDERNESS IN MOUTH AND THROAT WITH OR WITHOUT PRESENCE OF ULCERS (sore throat, sores in mouth, or a toothache) UNUSUAL RASH, SWELLING OR PAIN  UNUSUAL VAGINAL DISCHARGE OR ITCHING   Items with * indicate a potential emergency and should be followed up as soon as possible or go to the Emergency Department if any problems should occur.  Please show the CHEMOTHERAPY ALERT CARD or IMMUNOTHERAPY ALERT CARD at check-in to  the Emergency Department and triage nurse.  Should you have questions after your visit or need to cancel or reschedule your appointment, please contact [facility]  Dept: [phone]  and follow the prompts.  Office hours are 8:00 a.m. to 4:30 p.m. Monday - Friday. Please note that voicemails left after 4:00 p.m. may not be returned until the following business day.  We are closed weekends and major holidays. You have access to a nurse at all times for urgent questions. Please call the main number to the clinic Dept: [phone] and follow the prompts.   For any non-urgent questions, you may also contact your provider using MyChart. We now offer e-Visits for anyone 18 and older to request care online for non-urgent symptoms. For details visit [email or web address].   Also download the MyChart app! Go to the app store, search "MyChart", open the app, select [facility], and log in with your MyChart username and password.  Masks are optional in the cancer centers. If you would like for your care team to wear a mask while they are taking care of you, please let them know. You may have one support Stacy Rivas who is at least 69 years old accompany you for your appointments. 

## 2022-05-26 NOTE — Assessment & Plan Note (Signed)
She has recurrent sinus infection I recommend a course of azithromycin She could benefit from influenza vaccination next visit

## 2022-05-27 ENCOUNTER — Encounter: Payer: Self-pay | Admitting: Hematology and Oncology

## 2022-05-28 LAB — CA 125: Cancer Antigen (CA) 125: 7.7 U/mL (ref 0.0–38.1)

## 2022-06-03 ENCOUNTER — Encounter: Payer: Self-pay | Admitting: Hematology and Oncology

## 2022-06-04 ENCOUNTER — Other Ambulatory Visit: Payer: Self-pay | Admitting: Hematology and Oncology

## 2022-06-04 ENCOUNTER — Other Ambulatory Visit: Payer: Self-pay

## 2022-06-04 ENCOUNTER — Encounter: Payer: Self-pay | Admitting: Hematology and Oncology

## 2022-06-06 ENCOUNTER — Other Ambulatory Visit (HOSPITAL_COMMUNITY): Payer: Self-pay

## 2022-06-06 MED ORDER — HYDRALAZINE HCL 10 MG PO TABS
10.0000 mg | ORAL_TABLET | Freq: Two times a day (BID) | ORAL | 1 refills | Status: DC
  Filled 2022-06-06: qty 60, 30d supply, fill #0

## 2022-06-13 ENCOUNTER — Ambulatory Visit (HOSPITAL_COMMUNITY)
Admission: RE | Admit: 2022-06-13 | Discharge: 2022-06-13 | Disposition: A | Discharge status: Home / Self Care | Payer: Medicare Other | Source: Ambulatory Visit | Attending: Hematology and Oncology | Admitting: Hematology and Oncology

## 2022-06-13 DIAGNOSIS — Z7189 Other specified counseling: Secondary | ICD-10-CM | POA: Diagnosis present

## 2022-06-13 DIAGNOSIS — C562 Malignant neoplasm of left ovary: Secondary | ICD-10-CM | POA: Diagnosis present

## 2022-06-13 MED ORDER — IOHEXOL 300 MG/ML  SOLN
100.0000 mL | Freq: Once | INTRAMUSCULAR | Status: AC | PRN
  Administered 2022-06-13: 100 mL via INTRAVENOUS

## 2022-06-13 MED ORDER — IOHEXOL 9 MG/ML PO SOLN
ORAL | Status: AC
  Filled 2022-06-13: qty 1000

## 2022-06-13 MED ORDER — IOHEXOL 9 MG/ML PO SOLN: 500.0000 mL | ORAL | Status: DC

## 2022-06-13 MED ORDER — SODIUM CHLORIDE (PF) 0.9 % IJ SOLN
INTRAMUSCULAR | Status: AC
  Filled 2022-06-13: qty 50

## 2022-06-13 MED ORDER — IOHEXOL 9 MG/ML PO SOLN: 1000.0000 mL | ORAL | Status: AC

## 2022-06-16 ENCOUNTER — Encounter: Payer: Self-pay | Admitting: Hematology and Oncology

## 2022-06-16 ENCOUNTER — Inpatient Hospital Stay (HOSPITAL_BASED_OUTPATIENT_CLINIC_OR_DEPARTMENT_OTHER): Payer: Medicare Other | Admitting: Hematology and Oncology

## 2022-06-16 ENCOUNTER — Inpatient Hospital Stay: Payer: Medicare Other

## 2022-06-16 VITALS — BP 154/88 | HR 76 | Temp 97.9°F | Resp 18 | Ht 62.0 in | Wt 145.2 lb

## 2022-06-16 DIAGNOSIS — I10 Essential (primary) hypertension: Secondary | ICD-10-CM | POA: Diagnosis not present

## 2022-06-16 DIAGNOSIS — Z5112 Encounter for antineoplastic immunotherapy: Secondary | ICD-10-CM | POA: Diagnosis not present

## 2022-06-16 DIAGNOSIS — Z7189 Other specified counseling: Secondary | ICD-10-CM | POA: Diagnosis not present

## 2022-06-16 DIAGNOSIS — C562 Malignant neoplasm of left ovary: Secondary | ICD-10-CM | POA: Diagnosis not present

## 2022-06-16 DIAGNOSIS — C786 Secondary malignant neoplasm of retroperitoneum and peritoneum: Secondary | ICD-10-CM

## 2022-06-16 LAB — CBC WITH DIFFERENTIAL (CANCER CENTER ONLY)
Abs Immature Granulocytes: 0.01 10*3/uL (ref 0.00–0.07)
Basophils Absolute: 0 10*3/uL (ref 0.0–0.1)
Basophils Relative: 1 %
Eosinophils Absolute: 0.2 10*3/uL (ref 0.0–0.5)
Eosinophils Relative: 3 %
HCT: 36.3 % (ref 36.0–46.0)
Hemoglobin: 12.6 g/dL (ref 12.0–15.0)
Immature Granulocytes: 0 %
Lymphocytes Relative: 26 %
Lymphs Abs: 1.1 10*3/uL (ref 0.7–4.0)
MCH: 33.8 pg (ref 26.0–34.0)
MCHC: 34.7 g/dL (ref 30.0–36.0)
MCV: 97.3 fL (ref 80.0–100.0)
Monocytes Absolute: 0.4 10*3/uL (ref 0.1–1.0)
Monocytes Relative: 8 %
Neutro Abs: 2.7 10*3/uL (ref 1.7–7.7)
Neutrophils Relative %: 62 %
Platelet Count: 158 10*3/uL (ref 150–400)
RBC: 3.73 MIL/uL — ABNORMAL LOW (ref 3.87–5.11)
RDW: 12.1 % (ref 11.5–15.5)
WBC Count: 4.4 10*3/uL (ref 4.0–10.5)
nRBC: 0 % (ref 0.0–0.2)

## 2022-06-16 LAB — TOTAL PROTEIN, URINE DIPSTICK: Protein, ur: NEGATIVE mg/dL

## 2022-06-16 MED ORDER — SODIUM CHLORIDE 0.9 % IV SOLN
15.0000 mg/kg | Freq: Once | INTRAVENOUS | Status: AC
  Administered 2022-06-16: 1000 mg via INTRAVENOUS
  Filled 2022-06-16: qty 32

## 2022-06-16 MED ORDER — SODIUM CHLORIDE 0.9% FLUSH
10.0000 mL | INTRAVENOUS | Status: DC | PRN
  Administered 2022-06-16: 10 mL

## 2022-06-16 MED ORDER — SODIUM CHLORIDE 0.9% FLUSH
10.0000 mL | Freq: Once | INTRAVENOUS | Status: AC
  Administered 2022-06-16: 10 mL

## 2022-06-16 MED ORDER — HEPARIN SOD (PORK) LOCK FLUSH 100 UNIT/ML IV SOLN
500.0000 [IU] | Freq: Once | INTRAVENOUS | Status: AC | PRN
  Administered 2022-06-16: 500 [IU]

## 2022-06-16 NOTE — Progress Notes (Signed)
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   All questions were answered. The patient knows to call the clinic with any problems, questions or concerns. The total time spent in the appointment was 30 minutes encounter with patients including review of chart and various tests results, discussions about plan of care and coordination of care plan   [name] Lark, MD 06/16/2022 3:38 PM  INTERVAL HISTORY: Please see below for problem oriented charting. she returns for treatment follow-up and review of test results She tolerated recent treatment well Her blood pressure is elevated today but usually at home, it is within normal range She is anxious She denies abdominal pain or changes in bowel habits  REVIEW OF SYSTEMS:   Constitutional: Denies fevers, chills or abnormal weight loss Eyes: Denies blurriness of vision Ears, nose, mouth, throat, and face: Denies mucositis or sore throat Respiratory: Denies cough, dyspnea or wheezes Cardiovascular: Denies palpitation, chest discomfort or lower extremity swelling Gastrointestinal:  Denies nausea, heartburn or change in bowel habits Skin: Denies abnormal skin rashes Lymphatics: Denies new lymphadenopathy or easy bruising Neurological:Denies numbness, tingling or new  weaknesses Behavioral/Psych: Mood is stable, no new changes  All other systems were reviewed with the patient and are negative.  I have reviewed the past medical history, past surgical history, social history and family history with the patient and they are unchanged from previous note.  ALLERGIES:  is allergic to skin adhesives [cyanoacrylate], shrimp [shellfish allergy], and sulfonamide derivatives.  MEDICATIONS:  Current Outpatient Medications  Medication Sig Dispense Refill   ALPRAZolam (XANAX) 0.25 MG tablet Take 1 tablet by mouth at bedtime as needed for anxiety. 30 tablet 0   carboxymethylcellulose (REFRESH PLUS) 0.5 % SOLN Place 1 drop into both eyes 2 (two) times daily as needed (dry eyes).     Cholecalciferol (VITAMIN D3) 25 MCG (1000 UT) CAPS Take 1,000 Units by mouth daily.      esomeprazole (NEXIUM) 20 MG capsule Take 20 mg by mouth daily.      estradiol (ESTRACE VAGINAL) 0.1 MG/GM vaginal cream Place 1 applicatorful vaginally 3 (three) times a week. 42.5 g 12   furosemide (LASIX) 20 MG tablet Take 1 tablet (20 mg total) by mouth daily. 30 tablet 1   hydrALAZINE (APRESOLINE) 10 MG tablet Take 1 tablet (10 mg total) by mouth 2 (two) times daily. 60 tablet 1   lidocaine-prilocaine (EMLA) cream Apply topically as needed. 30 g 0   lisinopril (ZESTRIL) 20 MG tablet Take 1 tablet (20 mg total) by mouth daily. 30 tablet 1   loratadine (CLARITIN) 10 MG tablet Take 10 mg by mouth daily as needed for allergies.     Multiple Vitamin (MULTIVITAMIN WITH MINERALS) TABS tablet Take 1 tablet by mouth daily.     ondansetron (ZOFRAN) 8 MG tablet Take 1 tablet by mouth 2 times daily as needed. Start on the third day  after chemotherapy. 30 tablet 1   prochlorperazine (COMPAZINE) 10 MG tablet Take 1 tablet by mouth every 6 hours as needed (Nausea or vomiting). 30 tablet 1   No current facility-administered medications for this visit.   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 14.7 cm poorly defined soft tissue mass in central pelvis suspicious for primary ovarian carcinoma, with uterine leiomyosarcoma considered a less likely differential diagnosis. 2. Moderate ascites and diffuse peritoneal carcinomatosis. 3. Several small uterine fibroids.   11/13/2018 Tumor Marker   Patient's tumor was tested for the following markers: CA-125 Results of the tumor marker test revealed 1434   11/21/2018 Procedure   Successful ultrasound-guided diagnostic and therapeutic paracentesis yielding 3.1 liters of peritoneal fluid   11/21/2018 Pathology Results   PERITONEAL/ASCITIC FLUID(SPECIMEN 1 OF 1 COLLECTED 11/21/18): ADENOCARCINOMA. Specimen Clinical Information Pelvic mass suspicious for ovarian cancer Source Peritoneal/Ascitic Fluid, (specimen 1 of 1 collected 11/21/18) Gross Specimen: Received is/are 1000 ccs of dark amber fluid. (CM:cm) Prepared: # Smears: 0 # Concentration Technique Slides (i.e. 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   11/29/2018 Initial Diagnosis   Ovarian cancer ([place])   11/29/2018 Cancer Staging   Staging form: Ovary, Fallopian Tube, and Primary Peritoneal Carcinoma, AJCC 8th Edition - Clinical: cT3, cN0, cM0 - Signed by [name] Lark, MD on 11/29/2018   12/03/2018 Procedure   Successful ultrasound-guided therapeutic paracentesis yielding 3.7 liters of peritoneal fluid.     12/06/2018 Procedure   Placement of a subcutaneous port device. Catheter tip at the SVC and right  atrium junction   12/14/2018 Procedure   Successful ultrasound-guided paracentesis yielding 2.9 L of peritoneal fluid   12/28/2018 Tumor Marker   Patient's tumor was tested for the following markers: CA-125 Results of the tumor marker test revealed 812.   12/28/2018 - 04/22/2019 Chemotherapy   The patient had carboplatin and taxol for neoadjuvant treatment, followed by interval debulking surgery and subsequent adjuvant chemotherapy treatment.     02/01/2019 Imaging   Ct abdomen and pelvis 8.6 cm left ovarian mass, corresponding to the patient's known primary neoplasm, improved.   Mild peritoneal nodularity/omental caking, improved.   Small abdominopelvic ascites, improved.     02/01/2019 Tumor Marker   Patient's tumor was tested for the following markers: CA-125 Results of the tumor marker test revealed 183.   02/12/2019 Surgery   Preoperative Diagnosis: Stage IIIC ovarian cancer, s/p neoadjuvant chemotherapy      Procedure(s) Performed: 1. Exploratory laparotomy with total abdominal hysterectomy, bilateral salpingo-oophorectomy, omentectomy radical tumor debulking for ovarian cancer.   Surgeon: Thereasa Solo, MD.    Operative Findings: upper abdomen free of disease. No visible omental disease. Small volume (200cc) ascites. 8cm friable mass replacing left ovary and adherent to the sigmoid colon mesentery and ureter on the left.  Anterior fibroid. This represented an optimal cytoreduction (R0) with no gross visible disease remaining.    02/12/2019 Pathology Results   A. OVARY AND FALLOPIAN TUBE, LEFT, SALPINGO-OOPHORECTOMY: - Serous carcinoma, high grade, status post neoadjuvant  therapy - See oncology table and comment below B. UTERUS CERVIX WITH RIGHT FALLOPIAN TUBE AND OVARY, HYSTERECTOMY: Uterus: - Serosal surface involved by serous carcinoma - Endomyometrium uninvolved by carcinoma - Benign endometrial polyp (4.1 cm) - Leiomyomata (5.5 cm; largest) - Adenomyosis Cervix: -  Uninvolved by carcinoma Left ovary: - Serous carcinoma, high grade Left fallopian tube: - Serous carcinoma, high grade C. SOFT TISSUE, LEFT PELVIC SIDEWALL TUMOR, EXCISION: - Metastatic serous carcinoma, high-grade D. SOFT TISSUE, SIGMOID COLON MESENTERY, EXCISION: - Metastatic serous carcinoma, high-grade E. OMENTUM, TUMOR RESECTION: - Metastatic serous carcinoma, high-grade OVARY or FALLOPIAN TUBE or PRIMARY PERITONEUM: Procedure: Total hysterectomy and bilateral salpingo-oophorectomy, omentectomy and peritoneal biopsies Specimen Integrity: Fragmented Tumor Site: Left ovary and fallopian tube Ovarian Surface Involvement: Present Fallopian Tube Surface Involvement: Present Tumor Size: 6.3 cm (see comment) Histologic Type: Serous carcinoma Histologic Grade: High-grade Implants: Not applicable Other Tissue/ Organ Involvement: Right ovary, right fallopian tube, omentum, mesentery Largest Extrapelvic Peritoneal Focus: Macroscopic Peritoneal/Ascitic Fluid: Malignant Treatment Effect: No definite or minimal response identified (chemotherapy response score 1 [CRS1] Regional Lymph Nodes: No lymph nodes submitted or found Pathologic Stage Classification (pTNM, AJCC 8th Edition): pT3c, pNX Representative Tumor Block: A4 Comment(s): Additional testing (HER-2, MMR and MSI) are pending. The primary tumor site appears to be the left ovary and fallopian tube. The uterus is only involved on the serosal surface. There is tumor on the anterior peritoneal reflection.  Addendum: Tumor is Her2 negative,MSI stable   02/19/2019 Tumor Marker   Patient's tumor was tested for the following markers: CA-125 Results of the tumor marker test revealed 40.3   04/01/2019 Tumor Marker   Patient's tumor was tested for the following markers: CA-125 Results of the tumor marker test revealed 8.1    Genetic Testing   Negative testing. No pathogenic variants identified on the [employer]. The  report date is 04/17/2019.  Somatic genes analyzed through [facility]: ATM, BARD1, BRCA1, BRCA2, BRIP1, CHEK2, MRE11A, NBN, PALB2, RAD51C, RAD51D.  The CancerNext gene panel offered by [employer] includes sequencing and rearrangement analysis for the following 36 genes: APC*, ATM*, AXIN2, BARD1, BMPR1A, BRCA1*, BRCA2*, BRIP1*, CDH1*, CDK4, CDKN2A, CHEK2*, DICER1, MLH1*, MSH2*, MSH3, MSH6*, MUTYH*, NBN, NF1*, NTHL1, PALB2*, PMS2*, PTEN*, RAD51C*, RAD51D*, RECQL, SMAD4, SMARCA4, STK11 and TP53* (sequencing and deletion/duplication); HOXB13, POLD1 and POLE (sequencing only); EPCAM and GREM1 (deletion/duplication only).    05/21/2019 Imaging   1. Interval hysterectomy, oophorectomy and omentectomy. No evidence of residual ovarian carcinoma in the pelvis. No evidence of peritoneal disease. No intraperitoneal free fluid. 2. Nonobstructing LEFT renal calculi.  Normal ureters.   05/22/2019 Tumor Marker   Patient's tumor was tested for the following markers: CA-125 Results of the tumor marker test revealed 5.7.   06/03/2019 Procedure   Successful right IJ vein Port-A-Cath explant.   08/26/2019 Tumor Marker   Patient's tumor was tested for the following markers: CA-125. Results of the tumor marker test revealed 4.9   10/03/2019 Imaging   1. New tumor deposits along the capsular surfaces of the liver, spleen, in the left pelvis, and right paracolic gutter, compatible with metastatic disease. 2. Mild dilation of the dorsal pancreatic duct in the pancreatic body and head, cause uncertain. 3. Nonobstructive left nephrolithiasis. 4. Aortic atherosclerosis.   Aortic Atherosclerosis (ICD10-I70.0   10/10/2019 Procedure   Successful placement of a right IJ approach Power Port with ultrasound and fluoroscopic guidance. The catheter is ready for use.     10/11/2019 Tumor Marker   Patient's tumor was  tested for the following markers: CA-125 Results of the tumor marker test revealed 19.6.   10/14/2019  -  Chemotherapy   The patient had carboplatin and gemzar for chemotherapy treatment.     11/04/2019 Tumor Marker   Patient's tumor was tested for the following markers: CA-125 Results of the tumor marker test revealed 11.4   11/28/2019 Tumor Marker   Patient's tumor was tested for the following markers: CA-125 Results of the tumor marker test revealed 5.8   12/20/2019 Imaging   1. Significant interval reduction in mixed solid and cystic nodule in the vicinity of the left ovary, with significant improvement or resolution of multiple pelvic, peritoneal, and organ capsule implants. Resolution of previously seen small volume ascites. Findings are consistent with treatment response of abdominal metastatic disease. 2. Status post hysterectomy, oophorectomy, and omentectomy. 3. Nonobstructive left nephrolithiasis. 4. Aortic Atherosclerosis (ICD10-I70.0).   12/23/2019 Tumor Marker   Patient's tumor was tested for the following markers: CA-125 Results of the tumor marker test revealed 5.0   01/24/2020 Tumor Marker   Patient's tumor was tested for the following markers: CA-125 Results of the tumor marker test revealed 4.8   02/24/2020 Tumor Marker   Patient's tumor was tested for the following markers: CA-125. Results of the tumor marker test revealed 4.3.   03/06/2020 Imaging   1. Status post hysterectomy, bilateral oophorectomy, and omentectomy. 2. Resolution of previously described left adnexal nodularity. 3. No residual disease identified. 4.  Aortic Atherosclerosis (ICD10-I70.0). 5. Left nephrolithiasis.   04/20/2020 Tumor Marker   Patient's tumor was tested for the following markers: CA-125 Results of the tumor marker test revealed 4.6   05/18/2020 Tumor Marker   Patient's tumor was tested for the following markers: CA-125 Results of the tumor marker test revealed 4.7   05/29/2020 Imaging   Status post hysterectomy and bilateral salpingo oophorectomy.   No evidence of recurrent or  metastatic disease.   06/08/2020 - 09/13/2021 Chemotherapy   She was on niraparib       06/17/2020 Tumor Marker   Patient's tumor was tested for the following markers: CA-125 Results of the tumor marker test revealed 4.5   08/04/2020 Tumor Marker   Patient's tumor was tested for the following markers: CA-125. Results of the tumor marker test revealed 5.7   09/07/2020 Imaging   1. Status post hysterectomy and bilateral salpingo oophorectomy. No evidence of recurrent or metastatic disease. 2. Nonobstructing left nephrolithiasis. 3. Left-sided colonic diverticulosis without findings of acute diverticulitis. 4. Aortic atherosclerosis.     09/07/2020 Tumor Marker   Patient's tumor was tested for the following markers: CA-125 Results of the tumor marker test revealed 4.8.   10/21/2020 Tumor Marker   Patient's tumor was tested for the following markers: CA-125. Results of the tumor marker test revealed 5.1.   12/14/2020 Tumor Marker   Patient's tumor was tested for the following markers: CA-125. Results of the tumor marker test revealed 5.5.   04/05/2021 Tumor Marker   Patient's tumor was tested for the following markers: CA-125. Results of the tumor marker test revealed 5.9.   05/31/2021 Tumor Marker   Patient's tumor was tested for the following markers: CA-125. Results of the tumor marker test revealed 8.1.   09/13/2021 Imaging   1. Interval development of multiple new cystic and solid lesions in the abdomen and pelvis measuring up to 10.3 x 7.2 cm and consistent with peritoneal metastatic disease. 2. Nonobstructing left renal stones. 3. Aortic Atherosclerosis (ICD10-I70.0).     09/14/2021 Tumor  Marker   Patient's tumor was tested for the following markers: CA-125. Results of the tumor marker test revealed 28.5.   09/16/2021 Echocardiogram   1. Left ventricular ejection fraction, by estimation, is 60 to 65%. The left ventricle has normal function. The left ventricle has no  regional wall motion abnormalities. Left ventricular diastolic parameters are consistent with Grade I diastolic dysfunction (impaired relaxation).  2. Right ventricular systolic function is normal. The right ventricular size is normal.  3. The mitral valve is normal in structure. Trivial mitral valve regurgitation. No evidence of mitral stenosis.  4. The aortic valve has an indeterminant number of cusps. Aortic valve regurgitation is trivial. No aortic stenosis is present.  5. The inferior vena cava is normal in size with greater than 50% respiratory variability, suggesting right atrial pressure of 3 mmHg.     09/20/2021 - 01/17/2022 Chemotherapy   Patient is on Treatment Plan : OVARIAN RECURRENT Liposomal Doxorubicin + Carboplatin q28d X 6 Cycles     09/20/2021 - 02/22/2022 Chemotherapy   Patient is on Treatment Plan : OVARIAN RECURRENT Liposomal Doxorubicin + Carboplatin q28d X 6 Cycles     09/23/2021 Tumor Marker   Patient's tumor was tested for the following markers: CA-125. Results of the tumor marker test revealed 27.7.   11/24/2021 Tumor Marker   Patient's tumor was tested for the following markers: CA-125. Results of the tumor marker test revealed 28.4.   12/13/2021 Imaging   1. Overall stable peritoneal carcinomatosis. No new disease is demonstrated. 2. Moderate left-sided hydroureteronephrosis likely due to compression of the left ureter by the left pelvic mass. 3. Stable left-sided renal calculi. 4. Numerous left renal calculi.     12/22/2021 Tumor Marker   Patient's tumor was tested for the following markers: CA-125. Results of the tumor marker test revealed 24.7.   01/18/2022 Tumor Marker   Patient's tumor was tested for the following markers: CA-125. Results of the tumor marker test revealed 9.9.   01/27/2022 Echocardiogram    1. Left ventricular ejection fraction, by estimation, is 60 to 65%. The left ventricle has normal function. The left ventricle has no regional wall motion  abnormalities. Left ventricular diastolic parameters are consistent with Grade I diastolic dysfunction (impaired relaxation). The average left ventricular global longitudinal strain is -21.1 %. The global longitudinal strain is normal.  2. Right ventricular systolic function is normal. The right ventricular size is normal.  3. The mitral valve is abnormal. Mild mitral valve regurgitation.  4. The aortic valve is tricuspid. Aortic valve regurgitation is trivial.  5. The inferior vena cava is normal in size with greater than 50% respiratory variability, suggesting right atrial pressure of 3 mmHg.   02/23/2022 Tumor Marker   Patient's tumor was tested for the following markers: CA-125. Results of the tumor marker test revealed 8.5.   03/18/2022 Imaging   1. Diminished size of nodular and mixed solid and cystic peritoneal implants in the hepatorenal fossa, right lower quadrant, and left pelvis. 2. Unchanged solid mass in the right pelvis measuring 4.0 x 3.7 cm. 3. Constellation of findings is overall consistent with treatment response 4. Status post hysterectomy, oophorectomy, and omentectomy. 5. Nonobstructive left nephrolithiasis.   Aortic Atherosclerosis (ICD10-I70.0).     03/22/2022 -  Chemotherapy   Patient is on Treatment Plan : Ovarian Bevacizumab q21d     03/24/2022 Tumor Marker   Patient's tumor was tested for the following markers: CA-125. Results of the tumor marker test revealed 7.1.   04/28/2022 Tumor  Marker   Patient's tumor was tested for the following markers: CA-125. Results of the tumor marker test revealed 7.0.   05/27/2022 Tumor Marker   Patient's tumor was tested for the following markers: CA-125. Results of the tumor marker test revealed 7.7.   06/14/2022 Imaging   1. Slight response to therapy as evidenced by decrease in size of a cystic and solid mass in the left adnexa as well as peritoneal implants in the right paracolic gutter. Solid right adnexal mass and  additional peritoneal implants along right hepatic lobe and in the left anatomic pelvis appear stable. 2. Hepatic steatosis. 3. Left renal stones. 4.  Aortic atherosclerosis (ICD10-I70.0).     PHYSICAL EXAMINATION: ECOG PERFORMANCE STATUS: 1 - Symptomatic but completely ambulatory  Vitals:   06/16/22 0824  BP: (!) 154/88  Pulse: 76  Resp: 18  Temp: 97.9 F (36.6 C)  SpO2: 100%   Filed Weights   06/16/22 0824  Weight: 145 lb 3.2 oz (65.9 kg)    GENERAL:alert, no distress and comfortable NEURO: alert & oriented x 3 with fluent speech, no focal motor/sensory deficits  LABORATORY DATA:  I have reviewed the data as listed    Component Value Date/Time   NA 140 05/26/2022 0916   K 4.1 05/26/2022 0916   CL 106 05/26/2022 0916   CO2 27 05/26/2022 0916   GLUCOSE 87 05/26/2022 0916   BUN 12 05/26/2022 0916   CREATININE 0.54 05/26/2022 0916   CALCIUM 9.5 05/26/2022 0916   PROT 6.3 (L) 05/26/2022 0916   ALBUMIN 4.2 05/26/2022 0916   AST 48 (H) 05/26/2022 0916   ALT 39 05/26/2022 0916   ALKPHOS 119 05/26/2022 0916   BILITOT 1.5 (H) 05/26/2022 0916   GFRNONAA >60 05/26/2022 0916   GFRAA >60 02/24/2020 0852    No results found for: "SPEP", "UPEP"  Lab Results  Component Value Date   WBC 4.4 06/16/2022   NEUTROABS 2.7 06/16/2022   HGB 12.6 06/16/2022   HCT 36.3 06/16/2022   MCV 97.3 06/16/2022   PLT 158 06/16/2022      Chemistry      Component Value Date/Time   NA 140 05/26/2022 0916   K 4.1 05/26/2022 0916   CL 106 05/26/2022 0916   CO2 27 05/26/2022 0916   BUN 12 05/26/2022 0916   CREATININE 0.54 05/26/2022 0916      Component Value Date/Time   CALCIUM 9.5 05/26/2022 0916   ALKPHOS 119 05/26/2022 0916   AST 48 (H) 05/26/2022 0916   ALT 39 05/26/2022 0916   BILITOT 1.5 (H) 05/26/2022 0916       RADIOGRAPHIC STUDIES: I have reviewed multiple imaging studies with the patient I have personally reviewed the radiological images as listed and agreed with  the findings in the report. CT ABDOMEN PELVIS W CONTRAST  Result Date: 06/13/2022 CLINICAL DATA:  Ovarian cancer, assess treatment response. * Tracking Code: BO * EXAM: CT ABDOMEN AND PELVIS WITH CONTRAST TECHNIQUE: Multidetector CT imaging of the abdomen and pelvis was performed using the standard protocol following bolus administration of intravenous contrast. RADIATION DOSE REDUCTION: This exam was performed according to the departmental dose-optimization program which includes automated exposure control, adjustment of the mA and/or kV according to patient size and/or use of iterative reconstruction technique. CONTRAST:  130m OMNIPAQUE IOHEXOL 300 MG/ML  SOLN COMPARISON:  03/17/2022. FINDINGS: Lower chest: Lung bases are clear. Heart is enlarged. No pericardial or pleural effusion. Distal esophagus is grossly unremarkable. Hepatobiliary: Liver is decreased in attenuation  diffusely. Liver and gallbladder are otherwise unremarkable. No biliary ductal dilatation. Pancreas: Negative. Spleen: Negative. Adrenals/Urinary Tract: Adrenal glands and right kidney are unremarkable. Left renal stones. Left kidney is otherwise unremarkable. Ureters are decompressed. Bladder is grossly unremarkable. Stomach/Bowel: Stomach, small bowel, appendix and colon are unremarkable. Vascular/Lymphatic: Atherosclerotic calcification of the aorta. No pathologically enlarged lymph nodes. Reproductive: Hysterectomy. Mildly heterogeneous solid right adnexal mass measures 3.8 x 4.2 cm (2/71), stable. Cystic mass with peripheral nodularity in the left adnexa has decreased in size, now measuring 3.4 x 4.1 cm (2/69), previously 5.2 x 6.3 cm. Other: Nodularity along the surface of the medial right hepatic lobe is unchanged, measuring up to 1.6 x 2.4 cm (2/21). Minimal residual nodularity in the inferior right paracolic gutter, measuring up to 5 mm (2/51), previously seen as a collection of fluid and nodularity on 03/17/2022. Left-sided pelvic  implants along the iliac bifurcation (2/56-61) and sigmoid mesentery measure up to 9 mm (2/63), stable. Musculoskeletal: Degenerative changes in the spine. No worrisome lytic or sclerotic lesions. IMPRESSION: 1. Slight response to therapy as evidenced by decrease in size of a cystic and solid mass in the left adnexa as well as peritoneal implants in the right paracolic gutter. Solid right adnexal mass and additional peritoneal implants along right hepatic lobe and in the left anatomic pelvis appear stable. 2. Hepatic steatosis. 3. Left renal stones. 4.  Aortic atherosclerosis (ICD10-I70.0). Electronically Signed   By: Lorin Picket M.D.   On: 06/13/2022 13:49

## 2022-06-16 NOTE — Assessment & Plan Note (Signed)
Her blood pressure is elevated today likely due to whitecoat hypertension Her blood pressure at home is within normal range She will continue current antihypertensives

## 2022-06-16 NOTE — Patient Instructions (Signed)
[name]  Discharge Instructions: Thank you for choosing [facility] to provide your oncology and hematology care.   If you have a lab appointment with the [facility], please go directly to the [facility] and check in at the registration area.   Wear comfortable clothing and clothing appropriate for easy access to any Portacath or PICC line.   We strive to give you quality time with your provider. You may need to reschedule your appointment if you arrive late (15 or more minutes).  Arriving late affects you and other patients whose appointments are after yours.  Also, if you miss three or more appointments without notifying the office, you may be dismissed from the clinic at the provider's discretion.      For prescription refill requests, have your pharmacy contact our office and allow 72 hours for refills to be completed.    Today you received the following chemotherapy and/or immunotherapy agents: bevacizumab      To help prevent nausea and vomiting after your treatment, we encourage you to take your nausea medication as directed.  BELOW ARE SYMPTOMS THAT SHOULD BE REPORTED IMMEDIATELY: *FEVER GREATER THAN 100.4 F (38 C) OR HIGHER *CHILLS OR SWEATING *NAUSEA AND VOMITING THAT IS NOT CONTROLLED WITH YOUR NAUSEA MEDICATION *UNUSUAL SHORTNESS OF BREATH *UNUSUAL BRUISING OR BLEEDING *URINARY PROBLEMS (pain or burning when urinating, or frequent urination) *BOWEL PROBLEMS (unusual diarrhea, constipation, pain near the anus) TENDERNESS IN MOUTH AND THROAT WITH OR WITHOUT PRESENCE OF ULCERS (sore throat, sores in mouth, or a toothache) UNUSUAL RASH, SWELLING OR PAIN  UNUSUAL VAGINAL DISCHARGE OR ITCHING   Items with * indicate a potential emergency and should be followed up as soon as possible or go to the Emergency Department if any problems should occur.  Please show the CHEMOTHERAPY ALERT CARD or IMMUNOTHERAPY ALERT CARD at  check-in to the Emergency Department and triage nurse.  Should you have questions after your visit or need to cancel or reschedule your appointment, please contact [facility]  Dept: [phone]  and follow the prompts.  Office hours are 8:00 a.m. to 4:30 p.m. Monday - Friday. Please note that voicemails left after 4:00 p.m. may not be returned until the following business day.  We are closed weekends and major holidays. You have access to a nurse at all times for urgent questions. Please call the main number to the clinic Dept: [phone] and follow the prompts.   For any non-urgent questions, you may also contact your provider using MyChart. We now offer e-Visits for anyone 44 and older to request care online for non-urgent symptoms. For details visit [email or web address].   Also download the MyChart app! Go to the app store, search "MyChart", open the app, select [facility], and log in with your MyChart username and password.  Masks are optional in the cancer centers. If you would like for your care team to wear a mask while they are taking care of you, please let them know. You may have one support Stacy Rivas who is at least 69 years old accompany you for your appointments.

## 2022-06-16 NOTE — Assessment & Plan Note (Signed)
I have reviewed multiple CT imaging with the patient Overall, she has excellent response to therapy with regression of measurable disease I recommend we continue treatment with bevacizumab I plan to repeat imaging study again in April

## 2022-06-17 ENCOUNTER — Encounter: Payer: Self-pay | Admitting: Hematology and Oncology

## 2022-06-17 ENCOUNTER — Other Ambulatory Visit: Payer: Self-pay

## 2022-06-18 LAB — CA 125: Cancer Antigen (CA) 125: 11.2 U/mL (ref 0.0–38.1)

## 2022-06-19 ENCOUNTER — Other Ambulatory Visit: Payer: Self-pay

## 2022-06-28 ENCOUNTER — Other Ambulatory Visit: Payer: Self-pay

## 2022-07-05 ENCOUNTER — Inpatient Hospital Stay: Payer: Medicare Other | Attending: Gynecologic Oncology | Admitting: Hematology and Oncology

## 2022-07-05 ENCOUNTER — Inpatient Hospital Stay: Payer: Medicare Other

## 2022-07-05 ENCOUNTER — Encounter: Payer: Self-pay | Admitting: Hematology and Oncology

## 2022-07-05 ENCOUNTER — Other Ambulatory Visit (HOSPITAL_COMMUNITY): Payer: Self-pay

## 2022-07-05 VITALS — BP 159/94 | HR 92 | Temp 97.7°F | Resp 18 | Ht 62.0 in | Wt 146.6 lb

## 2022-07-05 DIAGNOSIS — C562 Malignant neoplasm of left ovary: Secondary | ICD-10-CM

## 2022-07-05 DIAGNOSIS — Z5112 Encounter for antineoplastic immunotherapy: Secondary | ICD-10-CM | POA: Diagnosis present

## 2022-07-05 DIAGNOSIS — I10 Essential (primary) hypertension: Secondary | ICD-10-CM | POA: Diagnosis not present

## 2022-07-05 DIAGNOSIS — Z79899 Other long term (current) drug therapy: Secondary | ICD-10-CM | POA: Diagnosis not present

## 2022-07-05 DIAGNOSIS — C786 Secondary malignant neoplasm of retroperitoneum and peritoneum: Secondary | ICD-10-CM | POA: Diagnosis not present

## 2022-07-05 DIAGNOSIS — Z7189 Other specified counseling: Secondary | ICD-10-CM

## 2022-07-05 DIAGNOSIS — C563 Malignant neoplasm of bilateral ovaries: Secondary | ICD-10-CM | POA: Diagnosis not present

## 2022-07-05 DIAGNOSIS — C785 Secondary malignant neoplasm of large intestine and rectum: Secondary | ICD-10-CM | POA: Diagnosis not present

## 2022-07-05 LAB — CBC WITH DIFFERENTIAL (CANCER CENTER ONLY)
Abs Immature Granulocytes: 0.01 10*3/uL (ref 0.00–0.07)
Basophils Absolute: 0 10*3/uL (ref 0.0–0.1)
Basophils Relative: 1 %
Eosinophils Absolute: 0.2 10*3/uL (ref 0.0–0.5)
Eosinophils Relative: 3 %
HCT: 37.9 % (ref 36.0–46.0)
Hemoglobin: 13.5 g/dL (ref 12.0–15.0)
Immature Granulocytes: 0 %
Lymphocytes Relative: 19 %
Lymphs Abs: 1.1 10*3/uL (ref 0.7–4.0)
MCH: 33.9 pg (ref 26.0–34.0)
MCHC: 35.6 g/dL (ref 30.0–36.0)
MCV: 95.2 fL (ref 80.0–100.0)
Monocytes Absolute: 0.5 10*3/uL (ref 0.1–1.0)
Monocytes Relative: 8 %
Neutro Abs: 4.1 10*3/uL (ref 1.7–7.7)
Neutrophils Relative %: 69 %
Platelet Count: 155 10*3/uL (ref 150–400)
RBC: 3.98 MIL/uL (ref 3.87–5.11)
RDW: 12.1 % (ref 11.5–15.5)
WBC Count: 5.9 10*3/uL (ref 4.0–10.5)
nRBC: 0 % (ref 0.0–0.2)

## 2022-07-05 LAB — TOTAL PROTEIN, URINE DIPSTICK: Protein, ur: NEGATIVE mg/dL

## 2022-07-05 MED ORDER — LISINOPRIL 20 MG PO TABS
20.0000 mg | ORAL_TABLET | Freq: Every day | ORAL | 1 refills | Status: DC
  Filled 2022-07-05: qty 30, 30d supply, fill #0
  Filled 2022-08-06: qty 30, 30d supply, fill #1

## 2022-07-05 MED ORDER — SODIUM CHLORIDE 0.9 % IV SOLN
15.0000 mg/kg | Freq: Once | INTRAVENOUS | Status: AC
  Administered 2022-07-05: 1000 mg via INTRAVENOUS
  Filled 2022-07-05: qty 8

## 2022-07-05 MED ORDER — HEPARIN SOD (PORK) LOCK FLUSH 100 UNIT/ML IV SOLN
500.0000 [IU] | Freq: Once | INTRAVENOUS | Status: AC | PRN
  Administered 2022-07-05: 500 [IU]

## 2022-07-05 MED ORDER — SODIUM CHLORIDE 0.9% FLUSH
10.0000 mL | Freq: Once | INTRAVENOUS | Status: AC
  Administered 2022-07-05: 10 mL

## 2022-07-05 MED ORDER — SODIUM CHLORIDE 0.9% FLUSH
10.0000 mL | INTRAVENOUS | Status: DC | PRN
  Administered 2022-07-05: 10 mL

## 2022-07-05 NOTE — Progress Notes (Signed)
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[facility]) I have reviewed recent imaging study with the patient I reassured her that her heart does not appear enlarged I also reassured her that the changes in the liver is related to hepatic steatosis We will proceed with treatment as scheduled  Essential hypertension Her blood pressure is elevated today likely due to whitecoat hypertension Her blood pressure at home is within normal range with occasional low blood pressure that is causing her to be somewhat symptomatic I recommend hydralazine taper  Orders Placed This Encounter  Procedures   CBC with Differential ([place] Only)    Standing Status:   Future    Standing Expiration Date:   08/17/2023   Total Protein, Urine dipstick    Standing Status:   Future    Standing Expiration Date:   08/17/2023    All questions were answered. The patient knows to call the clinic with any problems, questions or concerns. The total time spent in the appointment was 20 minutes encounter with patients including review of chart and various tests results, discussions about plan of care and coordination of care plan   [name] Lark, MD 07/05/2022 3:57 PM  INTERVAL HISTORY: Please see below for problem oriented charting. she returns for treatment follow-up on treatment with bevacizumab Her documented blood pressure from home is actually low to normal She is somewhat symptomatic at times She has concern over recent CT imaging results We discussed the findings today  REVIEW OF SYSTEMS:   Constitutional: Denies fevers, chills or abnormal weight loss Eyes: Denies blurriness of vision Ears, nose, mouth, throat, and face: Denies mucositis or sore throat Respiratory: Denies cough, dyspnea or wheezes Cardiovascular: Denies  palpitation, chest discomfort or lower extremity swelling Gastrointestinal:  Denies nausea, heartburn or change in bowel habits Skin: Denies abnormal skin rashes Lymphatics: Denies new lymphadenopathy or easy bruising Neurological:Denies numbness, tingling or new weaknesses Behavioral/Psych: Mood is stable, no new changes  All other systems were reviewed with the patient and are negative.  I have reviewed the past medical history, past surgical history, social history and family history with the patient and they are unchanged from previous note.  ALLERGIES:  is allergic to skin adhesives [cyanoacrylate], shrimp [shellfish allergy], and sulfonamide derivatives.  MEDICATIONS:  Current Outpatient Medications  Medication Sig Dispense Refill   ALPRAZolam (XANAX) 0.25 MG tablet Take 1 tablet by mouth at bedtime as needed for anxiety. 30 tablet 0   carboxymethylcellulose (REFRESH PLUS) 0.5 % SOLN Place 1 drop into both eyes 2 (two) times daily as needed (dry eyes).     Cholecalciferol (VITAMIN D3) 25 MCG (1000 UT) CAPS Take 1,000 Units by mouth daily.      esomeprazole (NEXIUM) 20 MG capsule Take 20 mg by mouth daily.      estradiol (ESTRACE VAGINAL) 0.1 MG/GM vaginal cream Place 1 applicatorful vaginally 3 (three) times a week. 42.5 g 12   furosemide (LASIX) 20 MG tablet Take 1 tablet (20 mg total) by mouth daily. 30 tablet 1   lidocaine-prilocaine (EMLA) cream Apply topically as needed. 30 g 0   lisinopril (ZESTRIL) 20 MG tablet Take 1 tablet by mouth daily. 30 tablet 1   loratadine (CLARITIN) 10 MG tablet Take 10 mg by mouth daily as needed for allergies.     Multiple Vitamin (MULTIVITAMIN WITH MINERALS)  TABS tablet Take 1 tablet by mouth daily.     ondansetron (ZOFRAN) 8 MG tablet Take 1 tablet by mouth 2 times daily as needed. Start on the third day after chemotherapy. 30 tablet 1   prochlorperazine (COMPAZINE) 10 MG tablet Take 1 tablet by mouth every 6 hours as needed (Nausea or vomiting).  30 tablet 1   No current facility-administered medications for this visit.    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 14.7 cm poorly defined soft tissue mass in central pelvis suspicious for primary ovarian carcinoma, with uterine leiomyosarcoma considered a less likely differential diagnosis. 2. Moderate ascites and diffuse peritoneal carcinomatosis. 3. Several small uterine fibroids.   11/13/2018 Tumor Marker   Patient's tumor was tested for the following markers: CA-125 Results of the tumor marker test revealed 1434   11/21/2018 Procedure   Successful ultrasound-guided diagnostic and therapeutic paracentesis yielding 3.1 liters of peritoneal fluid   11/21/2018 Pathology Results   PERITONEAL/ASCITIC FLUID(SPECIMEN 1 OF 1 COLLECTED 11/21/18): ADENOCARCINOMA. Specimen Clinical Information Pelvic mass suspicious for ovarian cancer Source Peritoneal/Ascitic Fluid, (specimen 1 of 1 collected 11/21/18) Gross Specimen: Received is/are 1000 ccs of dark amber fluid. (CM:cm) Prepared: # Smears: 0 # Concentration Technique Slides (i.e. 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   11/29/2018 Initial Diagnosis   Ovarian cancer ([place])   11/29/2018 Cancer Staging   Staging form: Ovary, Fallopian Tube, and Primary Peritoneal Carcinoma, AJCC 8th Edition - Clinical: cT3, cN0, cM0 - Signed by [name] Lark, MD on 11/29/2018   12/03/2018 Procedure   Successful ultrasound-guided therapeutic paracentesis yielding 3.7 liters of peritoneal fluid.     12/06/2018 Procedure   Placement of a subcutaneous port device. Catheter tip at the SVC and right atrium junction   12/14/2018 Procedure   Successful ultrasound-guided paracentesis yielding 2.9 L of  peritoneal fluid   12/28/2018 Tumor Marker   Patient's tumor was tested for the following markers: CA-125 Results of the tumor marker test revealed 812.   12/28/2018 - 04/22/2019 Chemotherapy   The patient had carboplatin and taxol for neoadjuvant treatment, followed by interval debulking surgery and subsequent adjuvant chemotherapy treatment.     02/01/2019 Imaging   Ct abdomen and pelvis 8.6 cm left ovarian mass, corresponding to the patient's known primary neoplasm, improved.   Mild peritoneal nodularity/omental caking, improved.   Small abdominopelvic ascites, improved.     02/01/2019 Tumor Marker   Patient's tumor was tested for the following markers: CA-125 Results of the tumor marker test revealed 183.   02/12/2019 Surgery   Preoperative Diagnosis: Stage IIIC ovarian cancer, s/p neoadjuvant chemotherapy      Procedure(s) Performed: 1. Exploratory laparotomy with total abdominal hysterectomy, bilateral salpingo-oophorectomy, omentectomy radical tumor debulking for ovarian cancer.   Surgeon: Thereasa Solo, MD.    Operative Findings: upper abdomen free of disease. No visible omental disease. Small volume (200cc) ascites. 8cm friable mass replacing left ovary and adherent to the sigmoid colon mesentery and ureter on the left.  Anterior fibroid. This represented an optimal cytoreduction (R0) with no gross visible disease remaining.    02/12/2019 Pathology Results   A. OVARY AND FALLOPIAN TUBE, LEFT, SALPINGO-OOPHORECTOMY: - Serous carcinoma, high grade, status post neoadjuvant therapy - See oncology table and comment below B. UTERUS CERVIX WITH  RIGHT FALLOPIAN TUBE AND OVARY, HYSTERECTOMY: Uterus: - Serosal surface involved by serous carcinoma - Endomyometrium uninvolved by carcinoma - Benign endometrial polyp (4.1 cm) - Leiomyomata (5.5 cm; largest) - Adenomyosis Cervix: - Uninvolved by carcinoma Left ovary: - Serous carcinoma, high grade Left fallopian tube: - Serous  carcinoma, high grade C. SOFT TISSUE, LEFT PELVIC SIDEWALL TUMOR, EXCISION: - Metastatic serous carcinoma, high-grade D. SOFT TISSUE, SIGMOID COLON MESENTERY, EXCISION: - Metastatic serous carcinoma, high-grade E. OMENTUM, TUMOR RESECTION: - Metastatic serous carcinoma, high-grade OVARY or FALLOPIAN TUBE or PRIMARY PERITONEUM: Procedure: Total hysterectomy and bilateral salpingo-oophorectomy, omentectomy and peritoneal biopsies Specimen Integrity: Fragmented Tumor Site: Left ovary and fallopian tube Ovarian Surface Involvement: Present Fallopian Tube Surface Involvement: Present Tumor Size: 6.3 cm (see comment) Histologic Type: Serous carcinoma Histologic Grade: High-grade Implants: Not applicable Other Tissue/ Organ Involvement: Right ovary, right fallopian tube, omentum, mesentery Largest Extrapelvic Peritoneal Focus: Macroscopic Peritoneal/Ascitic Fluid: Malignant Treatment Effect: No definite or minimal response identified (chemotherapy response score 1 [CRS1] Regional Lymph Nodes: No lymph nodes submitted or found Pathologic Stage Classification (pTNM, AJCC 8th Edition): pT3c, pNX Representative Tumor Block: A4 Comment(s): Additional testing (HER-2, MMR and MSI) are pending. The primary tumor site appears to be the left ovary and fallopian tube. The uterus is only involved on the serosal surface. There is tumor on the anterior peritoneal reflection.  Addendum: Tumor is Her2 negative,MSI stable   02/19/2019 Tumor Marker   Patient's tumor was tested for the following markers: CA-125 Results of the tumor marker test revealed 40.3   04/01/2019 Tumor Marker   Patient's tumor was tested for the following markers: CA-125 Results of the tumor marker test revealed 8.1    Genetic Testing   Negative testing. No pathogenic variants identified on the [employer]. The report date is 04/17/2019.  Somatic genes analyzed through [facility]: ATM, BARD1, BRCA1,  BRCA2, BRIP1, CHEK2, MRE11A, NBN, PALB2, RAD51C, RAD51D.  The [facility]Next gene panel offered by [employer] includes sequencing and rearrangement analysis for the following 36 genes: APC*, ATM*, AXIN2, BARD1, BMPR1A, BRCA1*, BRCA2*, BRIP1*, CDH1*, CDK4, CDKN2A, CHEK2*, DICER1, MLH1*, MSH2*, MSH3, MSH6*, MUTYH*, NBN, NF1*, NTHL1, PALB2*, PMS2*, PTEN*, RAD51C*, RAD51D*, RECQL, SMAD4, SMARCA4, STK11 and TP53* (sequencing and deletion/duplication); HOXB13, POLD1 and POLE (sequencing only); EPCAM and GREM1 (deletion/duplication only).    05/21/2019 Imaging   1. Interval hysterectomy, oophorectomy and omentectomy. No evidence of residual ovarian carcinoma in the pelvis. No evidence of peritoneal disease. No intraperitoneal free fluid. 2. Nonobstructing LEFT renal calculi.  Normal ureters.   05/22/2019 Tumor Marker   Patient's tumor was tested for the following markers: CA-125 Results of the tumor marker test revealed 5.7.   06/03/2019 Procedure   Successful right IJ vein Port-A-Cath explant.   08/26/2019 Tumor Marker   Patient's tumor was tested for the following markers: CA-125. Results of the tumor marker test revealed 4.9   10/03/2019 Imaging   1. New tumor deposits along the capsular surfaces of the liver, spleen, in the left pelvis, and right paracolic gutter, compatible with metastatic disease. 2. Mild dilation of the dorsal pancreatic duct in the pancreatic body and head, cause uncertain. 3. Nonobstructive left nephrolithiasis. 4. Aortic atherosclerosis.   Aortic Atherosclerosis (ICD10-I70.0   10/10/2019 Procedure   Successful placement of a right IJ approach Power Port with ultrasound and fluoroscopic guidance. The catheter is ready for use.     10/11/2019 Tumor Marker   Patient's tumor was tested for the following markers: CA-125 Results of the tumor marker test  revealed 19.6.   10/14/2019 -  Chemotherapy   The patient had carboplatin and gemzar for chemotherapy treatment.      11/04/2019 Tumor Marker   Patient's tumor was tested for the following markers: CA-125 Results of the tumor marker test revealed 11.4   11/28/2019 Tumor Marker   Patient's tumor was tested for the following markers: CA-125 Results of the tumor marker test revealed 5.8   12/20/2019 Imaging   1. Significant interval reduction in mixed solid and cystic nodule in the vicinity of the left ovary, with significant improvement or resolution of multiple pelvic, peritoneal, and organ capsule implants. Resolution of previously seen small volume ascites. Findings are consistent with treatment response of abdominal metastatic disease. 2. Status post hysterectomy, oophorectomy, and omentectomy. 3. Nonobstructive left nephrolithiasis. 4. Aortic Atherosclerosis (ICD10-I70.0).   12/23/2019 Tumor Marker   Patient's tumor was tested for the following markers: CA-125 Results of the tumor marker test revealed 5.0   01/24/2020 Tumor Marker   Patient's tumor was tested for the following markers: CA-125 Results of the tumor marker test revealed 4.8   02/24/2020 Tumor Marker   Patient's tumor was tested for the following markers: CA-125. Results of the tumor marker test revealed 4.3.   03/06/2020 Imaging   1. Status post hysterectomy, bilateral oophorectomy, and omentectomy. 2. Resolution of previously described left adnexal nodularity. 3. No residual disease identified. 4.  Aortic Atherosclerosis (ICD10-I70.0). 5. Left nephrolithiasis.   04/20/2020 Tumor Marker   Patient's tumor was tested for the following markers: CA-125 Results of the tumor marker test revealed 4.6   05/18/2020 Tumor Marker   Patient's tumor was tested for the following markers: CA-125 Results of the tumor marker test revealed 4.7   05/29/2020 Imaging   Status post hysterectomy and bilateral salpingo oophorectomy.   No evidence of recurrent or metastatic disease.   06/08/2020 - 09/13/2021 Chemotherapy   She was on niraparib        06/17/2020 Tumor Marker   Patient's tumor was tested for the following markers: CA-125 Results of the tumor marker test revealed 4.5   08/04/2020 Tumor Marker   Patient's tumor was tested for the following markers: CA-125. Results of the tumor marker test revealed 5.7   09/07/2020 Imaging   1. Status post hysterectomy and bilateral salpingo oophorectomy. No evidence of recurrent or metastatic disease. 2. Nonobstructing left nephrolithiasis. 3. Left-sided colonic diverticulosis without findings of acute diverticulitis. 4. Aortic atherosclerosis.     09/07/2020 Tumor Marker   Patient's tumor was tested for the following markers: CA-125 Results of the tumor marker test revealed 4.8.   10/21/2020 Tumor Marker   Patient's tumor was tested for the following markers: CA-125. Results of the tumor marker test revealed 5.1.   12/14/2020 Tumor Marker   Patient's tumor was tested for the following markers: CA-125. Results of the tumor marker test revealed 5.5.   04/05/2021 Tumor Marker   Patient's tumor was tested for the following markers: CA-125. Results of the tumor marker test revealed 5.9.   05/31/2021 Tumor Marker   Patient's tumor was tested for the following markers: CA-125. Results of the tumor marker test revealed 8.1.   09/13/2021 Imaging   1. Interval development of multiple new cystic and solid lesions in the abdomen and pelvis measuring up to 10.3 x 7.2 cm and consistent with peritoneal metastatic disease. 2. Nonobstructing left renal stones. 3. Aortic Atherosclerosis (ICD10-I70.0).     09/14/2021 Tumor Marker   Patient's tumor was tested for the following markers: CA-125.  Results of the tumor marker test revealed 28.5.   09/16/2021 Echocardiogram   1. Left ventricular ejection fraction, by estimation, is 60 to 65%. The left ventricle has normal function. The left ventricle has no regional wall motion abnormalities. Left ventricular diastolic parameters are consistent with Grade  I diastolic dysfunction (impaired relaxation).  2. Right ventricular systolic function is normal. The right ventricular size is normal.  3. The mitral valve is normal in structure. Trivial mitral valve regurgitation. No evidence of mitral stenosis.  4. The aortic valve has an indeterminant number of cusps. Aortic valve regurgitation is trivial. No aortic stenosis is present.  5. The inferior vena cava is normal in size with greater than 50% respiratory variability, suggesting right atrial pressure of 3 mmHg.     09/20/2021 - 01/17/2022 Chemotherapy   Patient is on Treatment Plan : OVARIAN RECURRENT Liposomal Doxorubicin + Carboplatin q28d X 6 Cycles     09/20/2021 - 02/22/2022 Chemotherapy   Patient is on Treatment Plan : OVARIAN RECURRENT Liposomal Doxorubicin + Carboplatin q28d X 6 Cycles     09/23/2021 Tumor Marker   Patient's tumor was tested for the following markers: CA-125. Results of the tumor marker test revealed 27.7.   11/24/2021 Tumor Marker   Patient's tumor was tested for the following markers: CA-125. Results of the tumor marker test revealed 28.4.   12/13/2021 Imaging   1. Overall stable peritoneal carcinomatosis. No new disease is demonstrated. 2. Moderate left-sided hydroureteronephrosis likely due to compression of the left ureter by the left pelvic mass. 3. Stable left-sided renal calculi. 4. Numerous left renal calculi.     12/22/2021 Tumor Marker   Patient's tumor was tested for the following markers: CA-125. Results of the tumor marker test revealed 24.7.   01/18/2022 Tumor Marker   Patient's tumor was tested for the following markers: CA-125. Results of the tumor marker test revealed 9.9.   01/27/2022 Echocardiogram    1. Left ventricular ejection fraction, by estimation, is 60 to 65%. The left ventricle has normal function. The left ventricle has no regional wall motion abnormalities. Left ventricular diastolic parameters are consistent with Grade I diastolic dysfunction  (impaired relaxation). The average left ventricular global longitudinal strain is -21.1 %. The global longitudinal strain is normal.  2. Right ventricular systolic function is normal. The right ventricular size is normal.  3. The mitral valve is abnormal. Mild mitral valve regurgitation.  4. The aortic valve is tricuspid. Aortic valve regurgitation is trivial.  5. The inferior vena cava is normal in size with greater than 50% respiratory variability, suggesting right atrial pressure of 3 mmHg.   02/23/2022 Tumor Marker   Patient's tumor was tested for the following markers: CA-125. Results of the tumor marker test revealed 8.5.   03/18/2022 Imaging   1. Diminished size of nodular and mixed solid and cystic peritoneal implants in the hepatorenal fossa, right lower quadrant, and left pelvis. 2. Unchanged solid mass in the right pelvis measuring 4.0 x 3.7 cm. 3. Constellation of findings is overall consistent with treatment response 4. Status post hysterectomy, oophorectomy, and omentectomy. 5. Nonobstructive left nephrolithiasis.   Aortic Atherosclerosis (ICD10-I70.0).     03/22/2022 -  Chemotherapy   Patient is on Treatment Plan : Ovarian Bevacizumab q21d     03/24/2022 Tumor Marker   Patient's tumor was tested for the following markers: CA-125. Results of the tumor marker test revealed 7.1.   04/28/2022 Tumor Marker   Patient's tumor was tested for the following markers: CA-125.  Results of the tumor marker test revealed 7.0.   05/27/2022 Tumor Marker   Patient's tumor was tested for the following markers: CA-125. Results of the tumor marker test revealed 7.7.   06/14/2022 Imaging   1. Slight response to therapy as evidenced by decrease in size of a cystic and solid mass in the left adnexa as well as peritoneal implants in the right paracolic gutter. Solid right adnexal mass and additional peritoneal implants along right hepatic lobe and in the left anatomic pelvis appear stable. 2.  Hepatic steatosis. 3. Left renal stones. 4.  Aortic atherosclerosis (ICD10-I70.0).   06/17/2022 Tumor Marker   Patient's tumor was tested for the following markers: CA-125. Results of the tumor marker test revealed 11.2.     PHYSICAL EXAMINATION: ECOG PERFORMANCE STATUS: 0 - Asymptomatic  Vitals:   07/05/22 0811  BP: (!) 159/94  Pulse: 92  Resp: 18  Temp: 97.7 F (36.5 C)  SpO2: 100%   Filed Weights   07/05/22 0811  Weight: 146 lb 9.6 oz (66.5 kg)    GENERAL:alert, no distress and comfortable  NEURO: alert & oriented x 3 with fluent speech, no focal motor/sensory deficits  LABORATORY DATA:  I have reviewed the data as listed    Component Value Date/Time   NA 140 05/26/2022 0916   K 4.1 05/26/2022 0916   CL 106 05/26/2022 0916   CO2 27 05/26/2022 0916   GLUCOSE 87 05/26/2022 0916   BUN 12 05/26/2022 0916   CREATININE 0.54 05/26/2022 0916   CALCIUM 9.5 05/26/2022 0916   PROT 6.3 (L) 05/26/2022 0916   ALBUMIN 4.2 05/26/2022 0916   AST 48 (H) 05/26/2022 0916   ALT 39 05/26/2022 0916   ALKPHOS 119 05/26/2022 0916   BILITOT 1.5 (H) 05/26/2022 0916   GFRNONAA >60 05/26/2022 0916   GFRAA >60 02/24/2020 0852    No results found for: "SPEP", "UPEP"  Lab Results  Component Value Date   WBC 5.9 07/05/2022   NEUTROABS 4.1 07/05/2022   HGB 13.5 07/05/2022   HCT 37.9 07/05/2022   MCV 95.2 07/05/2022   PLT 155 07/05/2022      Chemistry      Component Value Date/Time   NA 140 05/26/2022 0916   K 4.1 05/26/2022 0916   CL 106 05/26/2022 0916   CO2 27 05/26/2022 0916   BUN 12 05/26/2022 0916   CREATININE 0.54 05/26/2022 0916      Component Value Date/Time   CALCIUM 9.5 05/26/2022 0916   ALKPHOS 119 05/26/2022 0916   AST 48 (H) 05/26/2022 0916   ALT 39 05/26/2022 0916   BILITOT 1.5 (H) 05/26/2022 0916       RADIOGRAPHIC STUDIES: I have personally reviewed the radiological images as listed and agreed with the findings in the report. CT ABDOMEN PELVIS W  CONTRAST  Result Date: 06/13/2022 CLINICAL DATA:  Ovarian cancer, assess treatment response. * Tracking Code: BO * EXAM: CT ABDOMEN AND PELVIS WITH CONTRAST TECHNIQUE: Multidetector CT imaging of the abdomen and pelvis was performed using the standard protocol following bolus administration of intravenous contrast. RADIATION DOSE REDUCTION: This exam was performed according to the departmental dose-optimization program which includes automated exposure control, adjustment of the mA and/or kV according to patient size and/or use of iterative reconstruction technique. CONTRAST:  180m OMNIPAQUE IOHEXOL 300 MG/ML  SOLN COMPARISON:  03/17/2022. FINDINGS: Lower chest: Lung bases are clear. Heart is enlarged. No pericardial or pleural effusion. Distal esophagus is grossly unremarkable. Hepatobiliary: Liver is decreased in  attenuation diffusely. Liver and gallbladder are otherwise unremarkable. No biliary ductal dilatation. Pancreas: Negative. Spleen: Negative. Adrenals/Urinary Tract: Adrenal glands and right kidney are unremarkable. Left renal stones. Left kidney is otherwise unremarkable. Ureters are decompressed. Bladder is grossly unremarkable. Stomach/Bowel: Stomach, small bowel, appendix and colon are unremarkable. Vascular/Lymphatic: Atherosclerotic calcification of the aorta. No pathologically enlarged lymph nodes. Reproductive: Hysterectomy. Mildly heterogeneous solid right adnexal mass measures 3.8 x 4.2 cm (2/71), stable. Cystic mass with peripheral nodularity in the left adnexa has decreased in size, now measuring 3.4 x 4.1 cm (2/69), previously 5.2 x 6.3 cm. Other: Nodularity along the surface of the medial right hepatic lobe is unchanged, measuring up to 1.6 x 2.4 cm (2/21). Minimal residual nodularity in the inferior right paracolic gutter, measuring up to 5 mm (2/51), previously seen as a collection of fluid and nodularity on 03/17/2022. Left-sided pelvic implants along the iliac bifurcation (2/56-61) and  sigmoid mesentery measure up to 9 mm (2/63), stable. Musculoskeletal: Degenerative changes in the spine. No worrisome lytic or sclerotic lesions. IMPRESSION: 1. Slight response to therapy as evidenced by decrease in size of a cystic and solid mass in the left adnexa as well as peritoneal implants in the right paracolic gutter. Solid right adnexal mass and additional peritoneal implants along right hepatic lobe and in the left anatomic pelvis appear stable. 2. Hepatic steatosis. 3. Left renal stones. 4.  Aortic atherosclerosis (ICD10-I70.0). Electronically Signed   By: Lorin Picket M.D.   On: 06/13/2022 13:49

## 2022-07-05 NOTE — Patient Instructions (Signed)
[facility]  Discharge Instructions: Thank you for choosing [facility] to provide your oncology and hematology care.   If you have a lab appointment with the [facility], please go directly to the [facility] and check in at the registration area.   Wear comfortable clothing and clothing appropriate for easy access to any Portacath or PICC line.   We strive to give you quality time with your provider. You may need to reschedule your appointment if you arrive late (15 or more minutes).  Arriving late affects you and other patients whose appointments are after yours.  Also, if you miss three or more appointments without notifying the office, you may be dismissed from the clinic at the provider's discretion.      For prescription refill requests, have your pharmacy contact our office and allow 72 hours for refills to be completed.    Today you received the following chemotherapy and/or immunotherapy agents: bevacizumab-adcd      To help prevent nausea and vomiting after your treatment, we encourage you to take your nausea medication as directed.  BELOW ARE SYMPTOMS THAT SHOULD BE REPORTED IMMEDIATELY: *FEVER GREATER THAN 100.4 F (38 C) OR HIGHER *CHILLS OR SWEATING *NAUSEA AND VOMITING THAT IS NOT CONTROLLED WITH YOUR NAUSEA MEDICATION *UNUSUAL SHORTNESS OF BREATH *UNUSUAL BRUISING OR BLEEDING *URINARY PROBLEMS (pain or burning when urinating, or frequent urination) *BOWEL PROBLEMS (unusual diarrhea, constipation, pain near the anus) TENDERNESS IN MOUTH AND THROAT WITH OR WITHOUT PRESENCE OF ULCERS (sore throat, sores in mouth, or a toothache) UNUSUAL RASH, SWELLING OR PAIN  UNUSUAL VAGINAL DISCHARGE OR ITCHING   Items with * indicate a potential emergency and should be followed up as soon as possible or go to the Emergency Department if any problems should occur.  Please show the CHEMOTHERAPY ALERT CARD or IMMUNOTHERAPY ALERT CARD  at check-in to the Emergency Department and triage nurse.  Should you have questions after your visit or need to cancel or reschedule your appointment, please contact [facility]  Dept: [phone]  and follow the prompts.  Office hours are 8:00 a.m. to 4:30 p.m. Monday - Friday. Please note that voicemails left after 4:00 p.m. may not be returned until the following business day.  We are closed weekends and major holidays. You have access to a nurse at all times for urgent questions. Please call the main number to the clinic Dept: [phone] and follow the prompts.   For any non-urgent questions, you may also contact your provider using MyChart. We now offer e-Visits for anyone 46 and older to request care online for non-urgent symptoms. For details visit [email or web address].   Also download the MyChart app! Go to the app store, search "MyChart", open the app, select [facility], and log in with your MyChart username and password.

## 2022-07-05 NOTE — Assessment & Plan Note (Signed)
I have reviewed recent imaging study with the patient I reassured her that her heart does not appear enlarged I also reassured her that the changes in the liver is related to hepatic steatosis We will proceed with treatment as scheduled

## 2022-07-05 NOTE — Assessment & Plan Note (Signed)
Her blood pressure is elevated today likely due to whitecoat hypertension Her blood pressure at home is within normal range with occasional low blood pressure that is causing her to be somewhat symptomatic I recommend hydralazine taper

## 2022-07-08 ENCOUNTER — Encounter: Payer: Self-pay | Admitting: Hematology and Oncology

## 2022-07-09 ENCOUNTER — Other Ambulatory Visit (HOSPITAL_COMMUNITY): Payer: Self-pay

## 2022-07-26 ENCOUNTER — Encounter: Payer: Self-pay | Admitting: Hematology and Oncology

## 2022-07-26 ENCOUNTER — Inpatient Hospital Stay: Payer: Medicare Other

## 2022-07-26 ENCOUNTER — Inpatient Hospital Stay: Payer: Medicare Other | Attending: Gynecologic Oncology | Admitting: Hematology and Oncology

## 2022-07-26 VITALS — BP 131/79 | HR 73 | Temp 97.8°F | Resp 18

## 2022-07-26 VITALS — BP 143/78 | HR 76 | Temp 98.0°F | Resp 18 | Wt 146.4 lb

## 2022-07-26 DIAGNOSIS — C786 Secondary malignant neoplasm of retroperitoneum and peritoneum: Secondary | ICD-10-CM | POA: Insufficient documentation

## 2022-07-26 DIAGNOSIS — C785 Secondary malignant neoplasm of large intestine and rectum: Secondary | ICD-10-CM | POA: Insufficient documentation

## 2022-07-26 DIAGNOSIS — Z7189 Other specified counseling: Secondary | ICD-10-CM | POA: Diagnosis not present

## 2022-07-26 DIAGNOSIS — Z5112 Encounter for antineoplastic immunotherapy: Secondary | ICD-10-CM | POA: Diagnosis not present

## 2022-07-26 DIAGNOSIS — I10 Essential (primary) hypertension: Secondary | ICD-10-CM

## 2022-07-26 DIAGNOSIS — Z79899 Other long term (current) drug therapy: Secondary | ICD-10-CM | POA: Diagnosis not present

## 2022-07-26 DIAGNOSIS — C562 Malignant neoplasm of left ovary: Secondary | ICD-10-CM

## 2022-07-26 DIAGNOSIS — C563 Malignant neoplasm of bilateral ovaries: Secondary | ICD-10-CM | POA: Diagnosis not present

## 2022-07-26 LAB — CBC WITH DIFFERENTIAL (CANCER CENTER ONLY)
Abs Immature Granulocytes: 0.02 10*3/uL (ref 0.00–0.07)
Basophils Absolute: 0 10*3/uL (ref 0.0–0.1)
Basophils Relative: 1 %
Eosinophils Absolute: 0.3 10*3/uL (ref 0.0–0.5)
Eosinophils Relative: 5 %
HCT: 40.3 % (ref 36.0–46.0)
Hemoglobin: 14.1 g/dL (ref 12.0–15.0)
Immature Granulocytes: 0 %
Lymphocytes Relative: 26 %
Lymphs Abs: 1.6 10*3/uL (ref 0.7–4.0)
MCH: 32.6 pg (ref 26.0–34.0)
MCHC: 35 g/dL (ref 30.0–36.0)
MCV: 93.1 fL (ref 80.0–100.0)
Monocytes Absolute: 0.5 10*3/uL (ref 0.1–1.0)
Monocytes Relative: 8 %
Neutro Abs: 3.6 10*3/uL (ref 1.7–7.7)
Neutrophils Relative %: 60 %
Platelet Count: 170 10*3/uL (ref 150–400)
RBC: 4.33 MIL/uL (ref 3.87–5.11)
RDW: 12.2 % (ref 11.5–15.5)
WBC Count: 6 10*3/uL (ref 4.0–10.5)
nRBC: 0 % (ref 0.0–0.2)

## 2022-07-26 LAB — TOTAL PROTEIN, URINE DIPSTICK: Protein, ur: NEGATIVE mg/dL

## 2022-07-26 MED ORDER — HEPARIN SOD (PORK) LOCK FLUSH 100 UNIT/ML IV SOLN
500.0000 [IU] | Freq: Once | INTRAVENOUS | Status: AC | PRN
  Administered 2022-07-26: 500 [IU]

## 2022-07-26 MED ORDER — SODIUM CHLORIDE 0.9% FLUSH
10.0000 mL | Freq: Once | INTRAVENOUS | Status: AC
  Administered 2022-07-26: 10 mL

## 2022-07-26 MED ORDER — SODIUM CHLORIDE 0.9% FLUSH
10.0000 mL | INTRAVENOUS | Status: DC | PRN
  Administered 2022-07-26: 10 mL

## 2022-07-26 MED ORDER — SODIUM CHLORIDE 0.9 % IV SOLN: INTRAVENOUS | Status: DC

## 2022-07-26 MED ORDER — SODIUM CHLORIDE 0.9 % IV SOLN
15.0000 mg/kg | Freq: Once | INTRAVENOUS | Status: AC
  Administered 2022-07-26: 1000 mg via INTRAVENOUS
  Filled 2022-07-26: qty 32

## 2022-07-26 NOTE — Patient Instructions (Signed)

## 2022-07-26 NOTE — Patient Instructions (Signed)
[facility]  Discharge Instructions: Thank you for choosing [facility] to provide your oncology and hematology care.   If you have a lab appointment with the [facility], please go directly to the [facility] and check in at the registration area.   Wear comfortable clothing and clothing appropriate for easy access to any Portacath or PICC line.   We strive to give you quality time with your provider. You may need to reschedule your appointment if you arrive late (15 or more minutes).  Arriving late affects you and other patients whose appointments are after yours.  Also, if you miss three or more appointments without notifying the office, you may be dismissed from the clinic at the provider's discretion.      For prescription refill requests, have your pharmacy contact our office and allow 72 hours for refills to be completed.    Today you received the following chemotherapy and/or immunotherapy agents: bevacizumab-adcd      To help prevent nausea and vomiting after your treatment, we encourage you to take your nausea medication as directed.  BELOW ARE SYMPTOMS THAT SHOULD BE REPORTED IMMEDIATELY: *FEVER GREATER THAN 100.4 F (38 C) OR HIGHER *CHILLS OR SWEATING *NAUSEA AND VOMITING THAT IS NOT CONTROLLED WITH YOUR NAUSEA MEDICATION *UNUSUAL SHORTNESS OF BREATH *UNUSUAL BRUISING OR BLEEDING *URINARY PROBLEMS (pain or burning when urinating, or frequent urination) *BOWEL PROBLEMS (unusual diarrhea, constipation, pain near the anus) TENDERNESS IN MOUTH AND THROAT WITH OR WITHOUT PRESENCE OF ULCERS (sore throat, sores in mouth, or a toothache) UNUSUAL RASH, SWELLING OR PAIN  UNUSUAL VAGINAL DISCHARGE OR ITCHING   Items with * indicate a potential emergency and should be followed up as soon as possible or go to the Emergency Department if any problems should occur.  Please show the CHEMOTHERAPY ALERT CARD or IMMUNOTHERAPY ALERT CARD  at check-in to the Emergency Department and triage nurse.  Should you have questions after your visit or need to cancel or reschedule your appointment, please contact [facility]  Dept: [phone]  and follow the prompts.  Office hours are 8:00 a.m. to 4:30 p.m. Monday - Friday. Please note that voicemails left after 4:00 p.m. may not be returned until the following business day.  We are closed weekends and major holidays. You have access to a nurse at all times for urgent questions. Please call the main number to the clinic Dept: [phone] and follow the prompts.   For any non-urgent questions, you may also contact your provider using MyChart. We now offer e-Visits for anyone 3 and older to request care online for non-urgent symptoms. For details visit [email or web address].   Also download the MyChart app! Go to the app store, search "MyChart", open the app, select [facility], and log in with your MyChart username and password.

## 2022-07-26 NOTE — Assessment & Plan Note (Signed)
Her blood pressure is elevated today likely due to whitecoat hypertension Her blood pressure at home is within normal range with occasional low blood pressure that is causing her to be somewhat symptomatic I recommend hydralazine taper

## 2022-07-26 NOTE — Progress Notes (Signed)
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[facility]) Her last imaging study show positive response to therapy We will continue maintenance bevacizumab I plan to repeat imaging study again in April  Essential hypertension Her blood pressure is elevated today likely due to whitecoat hypertension Her blood pressure at home is within normal range with occasional low blood pressure that is causing her to be somewhat symptomatic I recommend hydralazine taper  Orders Placed This Encounter  Procedures   CT ABDOMEN PELVIS W CONTRAST    Standing Status:   Future    Standing Expiration Date:   07/26/2023    Order Specific Question:   If indicated for the ordered procedure, I authorize the administration of contrast media per Radiology protocol    Answer:   Yes    Order Specific Question:   Preferred imaging location?    Answer:   [facility]    Order Specific Question:   Radiology Contrast Protocol - do NOT remove file path    Answer:   [email or web address]   CBC with Differential (Cancer Center Only)    Standing Status:   Future    Standing Expiration Date:   09/07/2023   Total Protein, Urine dipstick    Standing Status:   Future    Standing Expiration Date:   09/07/2023   Comprehensive metabolic panel    Standing Status:   Standing    Number of Occurrences:   33    Standing Expiration Date:   07/26/2023    All questions were answered. The patient knows to call the clinic with any problems, questions or concerns. The total time spent in the appointment was 20 minutes encounter with patients including review of chart and various tests results, discussions about plan of care and coordination of care plan   [name] Lark, MD 07/26/2022 9:10 AM  INTERVAL HISTORY: Please see below  for problem oriented charting. she returns for treatment follow-up on maintenance bevacizumab Her documented blood pressure from home is usually within normal range She denies abdominal pain or changes in bowel habits We discussed next future appointment and follow-up  REVIEW OF SYSTEMS:   Constitutional: Denies fevers, chills or abnormal weight loss Eyes: Denies blurriness of vision Ears, nose, mouth, throat, and face: Denies mucositis or sore throat Respiratory: Denies cough, dyspnea or wheezes Cardiovascular: Denies palpitation, chest discomfort or lower extremity swelling Gastrointestinal:  Denies nausea, heartburn or change in bowel habits Skin: Denies abnormal skin rashes Lymphatics: Denies new lymphadenopathy or easy bruising Neurological:Denies numbness, tingling or new weaknesses Behavioral/Psych: Mood is stable, no new changes  All other systems were reviewed with the patient and are negative.  I have reviewed the past medical history, past surgical history, social history and family history with the patient and they are unchanged from previous note.  ALLERGIES:  is allergic to skin adhesives [cyanoacrylate], shrimp [shellfish allergy], and sulfonamide derivatives.  MEDICATIONS:  Current Outpatient Medications  Medication Sig Dispense Refill   ALPRAZolam (XANAX) 0.25 MG tablet Take 1 tablet by mouth at bedtime as needed for anxiety. 30 tablet 0   carboxymethylcellulose (REFRESH PLUS) 0.5 % SOLN Place 1 drop into both eyes 2 (two) times daily as needed (dry eyes).     Cholecalciferol (VITAMIN D3) 25 MCG (1000 UT) CAPS Take 1,000 Units by mouth daily.  esomeprazole (NEXIUM) 20 MG capsule Take 20 mg by mouth daily.      estradiol (ESTRACE VAGINAL) 0.1 MG/GM vaginal cream Place 1 applicatorful vaginally 3 (three) times a week. 42.5 g 12   furosemide (LASIX) 20 MG tablet Take 1 tablet (20 mg total) by mouth daily. 30 tablet 1   lidocaine-prilocaine (EMLA) cream Apply  topically as needed. 30 g 0   lisinopril (ZESTRIL) 20 MG tablet Take 1 tablet by mouth daily. 30 tablet 1   loratadine (CLARITIN) 10 MG tablet Take 10 mg by mouth daily as needed for allergies.     Multiple Vitamin (MULTIVITAMIN WITH MINERALS) TABS tablet Take 1 tablet by mouth daily.     ondansetron (ZOFRAN) 8 MG tablet Take 1 tablet by mouth 2 times daily as needed. Start on the third day after chemotherapy. 30 tablet 1   prochlorperazine (COMPAZINE) 10 MG tablet Take 1 tablet by mouth every 6 hours as needed (Nausea or vomiting). 30 tablet 1   No current facility-administered medications for this visit.   Facility-Administered Medications Ordered in Other Visits  Medication Dose Route Frequency Provider Last Rate Last Admin   0.9 %  sodium chloride infusion   Intravenous Continuous Alvy Bimler, Raia Amico, MD 20 mL/hr at 07/26/22 0853 New Bag at 07/26/22 0853   bevacizumab-adcd (VEGZELMA) 1,000 mg in sodium chloride 0.9 % 100 mL chemo infusion  15 mg/kg (Treatment Plan Recorded) Intravenous Once Alvy Bimler, Nisreen Guise, MD       heparin lock flush 100 unit/mL  500 Units Intracatheter Once PRN Alvy Bimler, Johnothan Bascomb, MD       sodium chloride flush (NS) 0.9 % injection 10 mL  10 mL Intracatheter PRN [name] Lark, MD        SUMMARY OF ONCOLOGIC HISTORY: Oncology History Overview Note  Her 2 negative, MSI stable, 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 14.7 cm poorly defined soft tissue mass in central pelvis suspicious for primary ovarian carcinoma, with uterine leiomyosarcoma considered a less likely differential diagnosis. 2. Moderate ascites and diffuse peritoneal carcinomatosis. 3. Several small uterine fibroids.   11/13/2018 Tumor Marker   Patient's tumor was tested for the following markers: CA-125 Results of the tumor marker test revealed 1434   11/21/2018 Procedure   Successful ultrasound-guided diagnostic and therapeutic paracentesis  yielding 3.1 liters of peritoneal fluid   11/21/2018 Pathology Results   PERITONEAL/ASCITIC FLUID(SPECIMEN 1 OF 1 COLLECTED 11/21/18): ADENOCARCINOMA. Specimen Clinical Information Pelvic mass suspicious for ovarian cancer Source Peritoneal/Ascitic Fluid, (specimen 1 of 1 collected 11/21/18) Gross Specimen: Received is/are 1000 ccs of dark amber fluid. (CM:cm) Prepared: # Smears: 0 # Concentration Technique Slides (i.e. 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   11/29/2018 Initial Diagnosis   Ovarian cancer ([place])   11/29/2018 Cancer Staging   Staging form: Ovary, Fallopian Tube, and Primary Peritoneal Carcinoma, AJCC 8th Edition - Clinical: cT3, cN0, cM0 - Signed by [name] Lark, MD on 11/29/2018   12/03/2018 Procedure   Successful ultrasound-guided therapeutic paracentesis yielding 3.7 liters of peritoneal fluid.     12/06/2018 Procedure   Placement of a subcutaneous port device. Catheter tip at the SVC and right atrium junction   12/14/2018 Procedure   Successful ultrasound-guided paracentesis yielding 2.9 L of peritoneal fluid   12/28/2018 Tumor Marker   Patient's tumor was tested for the following markers: CA-125 Results of the tumor marker test  revealed 812.   12/28/2018 - 04/22/2019 Chemotherapy   The patient had carboplatin and taxol for neoadjuvant treatment, followed by interval debulking surgery and subsequent adjuvant chemotherapy treatment.     02/01/2019 Imaging   Ct abdomen and pelvis 8.6 cm left ovarian mass, corresponding to the patient's known primary neoplasm, improved.   Mild peritoneal nodularity/omental caking, improved.   Small abdominopelvic ascites, improved.     02/01/2019 Tumor Marker   Patient's tumor was tested for the following markers: CA-125 Results of the tumor marker test revealed 183.   02/12/2019 Surgery   Preoperative Diagnosis: Stage IIIC ovarian  cancer, s/p neoadjuvant chemotherapy      Procedure(s) Performed: 1. Exploratory laparotomy with total abdominal hysterectomy, bilateral salpingo-oophorectomy, omentectomy radical tumor debulking for ovarian cancer.   Surgeon: Thereasa Solo, MD.    Operative Findings: upper abdomen free of disease. No visible omental disease. Small volume (200cc) ascites. 8cm friable mass replacing left ovary and adherent to the sigmoid colon mesentery and ureter on the left.  Anterior fibroid. This represented an optimal cytoreduction (R0) with no gross visible disease remaining.    02/12/2019 Pathology Results   A. OVARY AND FALLOPIAN TUBE, LEFT, SALPINGO-OOPHORECTOMY: - Serous carcinoma, high grade, status post neoadjuvant therapy - See oncology table and comment below B. UTERUS CERVIX WITH RIGHT FALLOPIAN TUBE AND OVARY, HYSTERECTOMY: Uterus: - Serosal surface involved by serous carcinoma - Endomyometrium uninvolved by carcinoma - Benign endometrial polyp (4.1 cm) - Leiomyomata (5.5 cm; largest) - Adenomyosis Cervix: - Uninvolved by carcinoma Left ovary: - Serous carcinoma, high grade Left fallopian tube: - Serous carcinoma, high grade C. SOFT TISSUE, LEFT PELVIC SIDEWALL TUMOR, EXCISION: - Metastatic serous carcinoma, high-grade D. SOFT TISSUE, SIGMOID COLON MESENTERY, EXCISION: - Metastatic serous carcinoma, high-grade E. OMENTUM, TUMOR RESECTION: - Metastatic serous carcinoma, high-grade OVARY or FALLOPIAN TUBE or PRIMARY PERITONEUM: Procedure: Total hysterectomy and bilateral salpingo-oophorectomy, omentectomy and peritoneal biopsies Specimen Integrity: Fragmented Tumor Site: Left ovary and fallopian tube Ovarian Surface Involvement: Present Fallopian Tube Surface Involvement: Present Tumor Size: 6.3 cm (see comment) Histologic Type: Serous carcinoma Histologic Grade: High-grade Implants: Not applicable Other Tissue/ Organ Involvement: Right ovary, right fallopian tube, omentum,  mesentery Largest Extrapelvic Peritoneal Focus: Macroscopic Peritoneal/Ascitic Fluid: Malignant Treatment Effect: No definite or minimal response identified (chemotherapy response score 1 [CRS1] Regional Lymph Nodes: No lymph nodes submitted or found Pathologic Stage Classification (pTNM, AJCC 8th Edition): pT3c, pNX Representative Tumor Block: A4 Comment(s): Additional testing (HER-2, MMR and MSI) are pending. The primary tumor site appears to be the left ovary and fallopian tube. The uterus is only involved on the serosal surface. There is tumor on the anterior peritoneal reflection.  Addendum: Tumor is Her2 negative,MSI stable   02/19/2019 Tumor Marker   Patient's tumor was tested for the following markers: CA-125 Results of the tumor marker test revealed 40.3   04/01/2019 Tumor Marker   Patient's tumor was tested for the following markers: CA-125 Results of the tumor marker test revealed 8.1    Genetic Testing   Negative testing. No pathogenic variants identified on the [employer]. The report date is 04/17/2019.  Somatic genes analyzed through [facility]: ATM, BARD1, BRCA1, BRCA2, BRIP1, CHEK2, MRE11A, NBN, PALB2, RAD51C, RAD51D.  The [facility]Next gene panel offered by [employer] includes sequencing and rearrangement analysis for the following 36 genes: APC*, ATM*, AXIN2, BARD1, BMPR1A, BRCA1*, BRCA2*, BRIP1*, CDH1*, CDK4, CDKN2A, CHEK2*, DICER1, MLH1*, MSH2*, MSH3, MSH6*, MUTYH*, NBN, NF1*, NTHL1, PALB2*, PMS2*, PTEN*, RAD51C*, RAD51D*, RECQL, SMAD4, SMARCA4,  STK11 and TP53* (sequencing and deletion/duplication); HOXB13, POLD1 and POLE (sequencing only); EPCAM and GREM1 (deletion/duplication only).    05/21/2019 Imaging   1. Interval hysterectomy, oophorectomy and omentectomy. No evidence of residual ovarian carcinoma in the pelvis. No evidence of peritoneal disease. No intraperitoneal free fluid. 2. Nonobstructing LEFT renal calculi.  Normal ureters.    05/22/2019 Tumor Marker   Patient's tumor was tested for the following markers: CA-125 Results of the tumor marker test revealed 5.7.   06/03/2019 Procedure   Successful right IJ vein Port-A-Cath explant.   08/26/2019 Tumor Marker   Patient's tumor was tested for the following markers: CA-125. Results of the tumor marker test revealed 4.9   10/03/2019 Imaging   1. New tumor deposits along the capsular surfaces of the liver, spleen, in the left pelvis, and right paracolic gutter, compatible with metastatic disease. 2. Mild dilation of the dorsal pancreatic duct in the pancreatic body and head, cause uncertain. 3. Nonobstructive left nephrolithiasis. 4. Aortic atherosclerosis.   Aortic Atherosclerosis (ICD10-I70.0   10/10/2019 Procedure   Successful placement of a right IJ approach Power Port with ultrasound and fluoroscopic guidance. The catheter is ready for use.     10/11/2019 Tumor Marker   Patient's tumor was tested for the following markers: CA-125 Results of the tumor marker test revealed 19.6.   10/14/2019 -  Chemotherapy   The patient had carboplatin and gemzar for chemotherapy treatment.     11/04/2019 Tumor Marker   Patient's tumor was tested for the following markers: CA-125 Results of the tumor marker test revealed 11.4   11/28/2019 Tumor Marker   Patient's tumor was tested for the following markers: CA-125 Results of the tumor marker test revealed 5.8   12/20/2019 Imaging   1. Significant interval reduction in mixed solid and cystic nodule in the vicinity of the left ovary, with significant improvement or resolution of multiple pelvic, peritoneal, and organ capsule implants. Resolution of previously seen small volume ascites. Findings are consistent with treatment response of abdominal metastatic disease. 2. Status post hysterectomy, oophorectomy, and omentectomy. 3. Nonobstructive left nephrolithiasis. 4. Aortic Atherosclerosis (ICD10-I70.0).   12/23/2019 Tumor Marker    Patient's tumor was tested for the following markers: CA-125 Results of the tumor marker test revealed 5.0   01/24/2020 Tumor Marker   Patient's tumor was tested for the following markers: CA-125 Results of the tumor marker test revealed 4.8   02/24/2020 Tumor Marker   Patient's tumor was tested for the following markers: CA-125. Results of the tumor marker test revealed 4.3.   03/06/2020 Imaging   1. Status post hysterectomy, bilateral oophorectomy, and omentectomy. 2. Resolution of previously described left adnexal nodularity. 3. No residual disease identified. 4.  Aortic Atherosclerosis (ICD10-I70.0). 5. Left nephrolithiasis.   04/20/2020 Tumor Marker   Patient's tumor was tested for the following markers: CA-125 Results of the tumor marker test revealed 4.6   05/18/2020 Tumor Marker   Patient's tumor was tested for the following markers: CA-125 Results of the tumor marker test revealed 4.7   05/29/2020 Imaging   Status post hysterectomy and bilateral salpingo oophorectomy.   No evidence of recurrent or metastatic disease.   06/08/2020 - 09/13/2021 Chemotherapy   She was on niraparib       06/17/2020 Tumor Marker   Patient's tumor was tested for the following markers: CA-125 Results of the tumor marker test revealed 4.5   08/04/2020 Tumor Marker   Patient's tumor was tested for the following markers: CA-125. Results of the tumor  marker test revealed 5.7   09/07/2020 Imaging   1. Status post hysterectomy and bilateral salpingo oophorectomy. No evidence of recurrent or metastatic disease. 2. Nonobstructing left nephrolithiasis. 3. Left-sided colonic diverticulosis without findings of acute diverticulitis. 4. Aortic atherosclerosis.     09/07/2020 Tumor Marker   Patient's tumor was tested for the following markers: CA-125 Results of the tumor marker test revealed 4.8.   10/21/2020 Tumor Marker   Patient's tumor was tested for the following markers: CA-125. Results of  the tumor marker test revealed 5.1.   12/14/2020 Tumor Marker   Patient's tumor was tested for the following markers: CA-125. Results of the tumor marker test revealed 5.5.   04/05/2021 Tumor Marker   Patient's tumor was tested for the following markers: CA-125. Results of the tumor marker test revealed 5.9.   05/31/2021 Tumor Marker   Patient's tumor was tested for the following markers: CA-125. Results of the tumor marker test revealed 8.1.   09/13/2021 Imaging   1. Interval development of multiple new cystic and solid lesions in the abdomen and pelvis measuring up to 10.3 x 7.2 cm and consistent with peritoneal metastatic disease. 2. Nonobstructing left renal stones. 3. Aortic Atherosclerosis (ICD10-I70.0).     09/14/2021 Tumor Marker   Patient's tumor was tested for the following markers: CA-125. Results of the tumor marker test revealed 28.5.   09/16/2021 Echocardiogram   1. Left ventricular ejection fraction, by estimation, is 60 to 65%. The left ventricle has normal function. The left ventricle has no regional wall motion abnormalities. Left ventricular diastolic parameters are consistent with Grade I diastolic dysfunction (impaired relaxation).  2. Right ventricular systolic function is normal. The right ventricular size is normal.  3. The mitral valve is normal in structure. Trivial mitral valve regurgitation. No evidence of mitral stenosis.  4. The aortic valve has an indeterminant number of cusps. Aortic valve regurgitation is trivial. No aortic stenosis is present.  5. The inferior vena cava is normal in size with greater than 50% respiratory variability, suggesting right atrial pressure of 3 mmHg.     09/20/2021 - 01/17/2022 Chemotherapy   Patient is on Treatment Plan : OVARIAN RECURRENT Liposomal Doxorubicin + Carboplatin q28d X 6 Cycles     09/20/2021 - 02/22/2022 Chemotherapy   Patient is on Treatment Plan : OVARIAN RECURRENT Liposomal Doxorubicin + Carboplatin q28d X 6 Cycles      09/23/2021 Tumor Marker   Patient's tumor was tested for the following markers: CA-125. Results of the tumor marker test revealed 27.7.   11/24/2021 Tumor Marker   Patient's tumor was tested for the following markers: CA-125. Results of the tumor marker test revealed 28.4.   12/13/2021 Imaging   1. Overall stable peritoneal carcinomatosis. No new disease is demonstrated. 2. Moderate left-sided hydroureteronephrosis likely due to compression of the left ureter by the left pelvic mass. 3. Stable left-sided renal calculi. 4. Numerous left renal calculi.     12/22/2021 Tumor Marker   Patient's tumor was tested for the following markers: CA-125. Results of the tumor marker test revealed 24.7.   01/18/2022 Tumor Marker   Patient's tumor was tested for the following markers: CA-125. Results of the tumor marker test revealed 9.9.   01/27/2022 Echocardiogram    1. Left ventricular ejection fraction, by estimation, is 60 to 65%. The left ventricle has normal function. The left ventricle has no regional wall motion abnormalities. Left ventricular diastolic parameters are consistent with Grade I diastolic dysfunction (impaired relaxation). The average left ventricular  global longitudinal strain is -21.1 %. The global longitudinal strain is normal.  2. Right ventricular systolic function is normal. The right ventricular size is normal.  3. The mitral valve is abnormal. Mild mitral valve regurgitation.  4. The aortic valve is tricuspid. Aortic valve regurgitation is trivial.  5. The inferior vena cava is normal in size with greater than 50% respiratory variability, suggesting right atrial pressure of 3 mmHg.   02/23/2022 Tumor Marker   Patient's tumor was tested for the following markers: CA-125. Results of the tumor marker test revealed 8.5.   03/18/2022 Imaging   1. Diminished size of nodular and mixed solid and cystic peritoneal implants in the hepatorenal fossa, right lower quadrant, and left  pelvis. 2. Unchanged solid mass in the right pelvis measuring 4.0 x 3.7 cm. 3. Constellation of findings is overall consistent with treatment response 4. Status post hysterectomy, oophorectomy, and omentectomy. 5. Nonobstructive left nephrolithiasis.   Aortic Atherosclerosis (ICD10-I70.0).     03/22/2022 -  Chemotherapy   Patient is on Treatment Plan : Ovarian Bevacizumab q21d     03/24/2022 Tumor Marker   Patient's tumor was tested for the following markers: CA-125. Results of the tumor marker test revealed 7.1.   04/28/2022 Tumor Marker   Patient's tumor was tested for the following markers: CA-125. Results of the tumor marker test revealed 7.0.   05/27/2022 Tumor Marker   Patient's tumor was tested for the following markers: CA-125. Results of the tumor marker test revealed 7.7.   06/14/2022 Imaging   1. Slight response to therapy as evidenced by decrease in size of a cystic and solid mass in the left adnexa as well as peritoneal implants in the right paracolic gutter. Solid right adnexal mass and additional peritoneal implants along right hepatic lobe and in the left anatomic pelvis appear stable. 2. Hepatic steatosis. 3. Left renal stones. 4.  Aortic atherosclerosis (ICD10-I70.0).   06/17/2022 Tumor Marker   Patient's tumor was tested for the following markers: CA-125. Results of the tumor marker test revealed 11.2.     PHYSICAL EXAMINATION: ECOG PERFORMANCE STATUS: 0 - Asymptomatic  Vitals:   07/26/22 0814  BP: (!) 143/78  Pulse: 76  Resp: 18  Temp: 98 F (36.7 C)  SpO2: 100%   Filed Weights   07/26/22 0814  Weight: 146 lb 6.4 oz (66.4 kg)    GENERAL:alert, no distress and comfortable NEURO: alert & oriented x 3 with fluent speech, no focal motor/sensory deficits  LABORATORY DATA:  I have reviewed the data as listed    Component Value Date/Time   NA 140 05/26/2022 0916   K 4.1 05/26/2022 0916   CL 106 05/26/2022 0916   CO2 27 05/26/2022 0916   GLUCOSE  87 05/26/2022 0916   BUN 12 05/26/2022 0916   CREATININE 0.54 05/26/2022 0916   CALCIUM 9.5 05/26/2022 0916   PROT 6.3 (L) 05/26/2022 0916   ALBUMIN 4.2 05/26/2022 0916   AST 48 (H) 05/26/2022 0916   ALT 39 05/26/2022 0916   ALKPHOS 119 05/26/2022 0916   BILITOT 1.5 (H) 05/26/2022 0916   GFRNONAA >60 05/26/2022 0916   GFRAA >60 02/24/2020 0852    No results found for: "SPEP", "UPEP"  Lab Results  Component Value Date   WBC 6.0 07/26/2022   NEUTROABS 3.6 07/26/2022   HGB 14.1 07/26/2022   HCT 40.3 07/26/2022   MCV 93.1 07/26/2022   PLT 170 07/26/2022      Chemistry      Component Value  Date/Time   NA 140 05/26/2022 0916   K 4.1 05/26/2022 0916   CL 106 05/26/2022 0916   CO2 27 05/26/2022 0916   BUN 12 05/26/2022 0916   CREATININE 0.54 05/26/2022 0916      Component Value Date/Time   CALCIUM 9.5 05/26/2022 0916   ALKPHOS 119 05/26/2022 0916   AST 48 (H) 05/26/2022 0916   ALT 39 05/26/2022 0916   BILITOT 1.5 (H) 05/26/2022 [id number]

## 2022-07-26 NOTE — Assessment & Plan Note (Signed)
Her last imaging study show positive response to therapy We will continue maintenance bevacizumab I plan to repeat imaging study again in April

## 2022-07-27 ENCOUNTER — Other Ambulatory Visit: Payer: Self-pay

## 2022-07-27 ENCOUNTER — Other Ambulatory Visit (HOSPITAL_COMMUNITY): Payer: Self-pay

## 2022-07-27 ENCOUNTER — Other Ambulatory Visit: Payer: Self-pay | Admitting: Hematology and Oncology

## 2022-07-27 MED ORDER — FUROSEMIDE 20 MG PO TABS
20.0000 mg | ORAL_TABLET | Freq: Every day | ORAL | 1 refills | Status: DC
  Filled 2022-07-27 – 2022-08-08 (×2): qty 30, 30d supply, fill #0

## 2022-08-04 ENCOUNTER — Other Ambulatory Visit (HOSPITAL_COMMUNITY): Payer: Self-pay

## 2022-08-04 ENCOUNTER — Encounter: Payer: Self-pay | Admitting: Hematology and Oncology

## 2022-08-08 ENCOUNTER — Other Ambulatory Visit (HOSPITAL_COMMUNITY): Payer: Self-pay

## 2022-08-11 ENCOUNTER — Other Ambulatory Visit (HOSPITAL_COMMUNITY): Payer: Self-pay

## 2022-08-16 ENCOUNTER — Inpatient Hospital Stay: Payer: Medicare Other

## 2022-08-16 VITALS — BP 138/72 | HR 70 | Temp 98.0°F | Resp 18

## 2022-08-16 DIAGNOSIS — Z5112 Encounter for antineoplastic immunotherapy: Secondary | ICD-10-CM | POA: Diagnosis not present

## 2022-08-16 DIAGNOSIS — C562 Malignant neoplasm of left ovary: Secondary | ICD-10-CM

## 2022-08-16 DIAGNOSIS — Z7189 Other specified counseling: Secondary | ICD-10-CM

## 2022-08-16 DIAGNOSIS — C786 Secondary malignant neoplasm of retroperitoneum and peritoneum: Secondary | ICD-10-CM

## 2022-08-16 LAB — CBC WITH DIFFERENTIAL (CANCER CENTER ONLY)
Abs Immature Granulocytes: 0 10*3/uL (ref 0.00–0.07)
Basophils Absolute: 0 10*3/uL (ref 0.0–0.1)
Basophils Relative: 0 %
Eosinophils Absolute: 0.2 10*3/uL (ref 0.0–0.5)
Eosinophils Relative: 4 %
HCT: 38.2 % (ref 36.0–46.0)
Hemoglobin: 13.3 g/dL (ref 12.0–15.0)
Immature Granulocytes: 0 %
Lymphocytes Relative: 25 %
Lymphs Abs: 1.6 10*3/uL (ref 0.7–4.0)
MCH: 32.6 pg (ref 26.0–34.0)
MCHC: 34.8 g/dL (ref 30.0–36.0)
MCV: 93.6 fL (ref 80.0–100.0)
Monocytes Absolute: 0.5 10*3/uL (ref 0.1–1.0)
Monocytes Relative: 8 %
Neutro Abs: 3.9 10*3/uL (ref 1.7–7.7)
Neutrophils Relative %: 63 %
Platelet Count: 156 10*3/uL (ref 150–400)
RBC: 4.08 MIL/uL (ref 3.87–5.11)
RDW: 12.3 % (ref 11.5–15.5)
WBC Count: 6.3 10*3/uL (ref 4.0–10.5)
nRBC: 0 % (ref 0.0–0.2)

## 2022-08-16 LAB — COMPREHENSIVE METABOLIC PANEL
ALT: 43 U/L (ref 0–44)
AST: 42 U/L — ABNORMAL HIGH (ref 15–41)
Albumin: 4.1 g/dL (ref 3.5–5.0)
Alkaline Phosphatase: 83 U/L (ref 38–126)
Anion gap: 7 (ref 5–15)
BUN: 15 mg/dL (ref 8–23)
CO2: 26 mmol/L (ref 22–32)
Calcium: 8.6 mg/dL — ABNORMAL LOW (ref 8.9–10.3)
Chloride: 104 mmol/L (ref 98–111)
Creatinine, Ser: 0.6 mg/dL (ref 0.44–1.00)
GFR, Estimated: >60 mL/min (ref >60)
Glucose, Bld: 99 mg/dL (ref 70–99)
Potassium: 3.7 mmol/L (ref 3.5–5.1)
Sodium: 137 mmol/L (ref 135–145)
Total Bilirubin: 1.6 mg/dL — ABNORMAL HIGH (ref 0.3–1.2)
Total Protein: 6.3 g/dL — ABNORMAL LOW (ref 6.5–8.1)

## 2022-08-16 LAB — TOTAL PROTEIN, URINE DIPSTICK: Protein, ur: NEGATIVE mg/dL

## 2022-08-16 MED ORDER — SODIUM CHLORIDE 0.9% FLUSH
10.0000 mL | Freq: Once | INTRAVENOUS | Status: AC
  Administered 2022-08-16: 10 mL

## 2022-08-16 MED ORDER — SODIUM CHLORIDE 0.9 % IV SOLN
15.0000 mg/kg | Freq: Once | INTRAVENOUS | Status: AC
  Administered 2022-08-16: 1000 mg via INTRAVENOUS
  Filled 2022-08-16: qty 32

## 2022-08-18 ENCOUNTER — Telehealth: Payer: Self-pay | Admitting: *Deleted

## 2022-08-18 ENCOUNTER — Encounter: Payer: Medicare Other | Admitting: Dietician

## 2022-08-18 NOTE — Telephone Encounter (Signed)
Pt called with concerns about elevated bp from last night which was 154/90. Pt took bp this morning and bp ws at 128/78. Retook bp before calling and bp was 140/88. Pt had concerns due to Dr.Gorsuch tapering off bp med. Advised pt to continue to take bp and write them down. If bp gets uncontrollable to go to the nearest ED. Pt also stated that she has high anxiety and isn't taking anything at the present time. Only a small amount in the past. Advised pt to try and relax and do breathing techniques that could help with relaxation. Pt verbalized understanding and was appreciative.

## 2022-08-23 ENCOUNTER — Other Ambulatory Visit: Payer: Self-pay

## 2022-09-01 ENCOUNTER — Other Ambulatory Visit: Payer: Self-pay | Admitting: Hematology and Oncology

## 2022-09-02 ENCOUNTER — Encounter: Payer: Self-pay | Admitting: Hematology and Oncology

## 2022-09-02 ENCOUNTER — Encounter (HOSPITAL_COMMUNITY): Payer: Self-pay

## 2022-09-02 ENCOUNTER — Other Ambulatory Visit (HOSPITAL_COMMUNITY): Payer: Self-pay

## 2022-09-02 ENCOUNTER — Ambulatory Visit (HOSPITAL_COMMUNITY)
Admission: RE | Admit: 2022-09-02 | Discharge: 2022-09-02 | Disposition: A | Discharge status: Home / Self Care | Payer: Medicare Other | Source: Ambulatory Visit | Attending: Hematology and Oncology | Admitting: Hematology and Oncology

## 2022-09-02 DIAGNOSIS — C562 Malignant neoplasm of left ovary: Secondary | ICD-10-CM | POA: Insufficient documentation

## 2022-09-02 MED ORDER — IOHEXOL 300 MG/ML  SOLN: 75.0000 mL | Freq: Once | INTRAMUSCULAR | Status: DC | PRN

## 2022-09-02 MED ORDER — IOHEXOL 300 MG/ML  SOLN
100.0000 mL | Freq: Once | INTRAMUSCULAR | Status: AC | PRN
  Administered 2022-09-02: 100 mL via INTRAVENOUS

## 2022-09-02 MED ORDER — SODIUM CHLORIDE (PF) 0.9 % IJ SOLN
INTRAMUSCULAR | Status: AC
  Filled 2022-09-02: qty 50

## 2022-09-02 MED ORDER — IOHEXOL 9 MG/ML PO SOLN
1000.0000 mL | ORAL | Status: AC
  Administered 2022-09-02: 1000 mL via ORAL

## 2022-09-02 MED ORDER — LISINOPRIL 20 MG PO TABS
20.0000 mg | ORAL_TABLET | Freq: Every day | ORAL | 1 refills | Status: DC
  Filled 2022-09-02: qty 90, 90d supply, fill #0
  Filled 2022-12-03: qty 90, 90d supply, fill #1

## 2022-09-03 ENCOUNTER — Other Ambulatory Visit (HOSPITAL_COMMUNITY): Payer: Self-pay

## 2022-09-06 ENCOUNTER — Other Ambulatory Visit: Payer: Self-pay

## 2022-09-06 ENCOUNTER — Inpatient Hospital Stay: Payer: Medicare Other

## 2022-09-06 ENCOUNTER — Inpatient Hospital Stay: Payer: Medicare Other | Attending: Gynecologic Oncology | Admitting: Hematology and Oncology

## 2022-09-06 ENCOUNTER — Encounter: Payer: Self-pay | Admitting: Hematology and Oncology

## 2022-09-06 DIAGNOSIS — Z5111 Encounter for antineoplastic chemotherapy: Secondary | ICD-10-CM | POA: Diagnosis present

## 2022-09-06 DIAGNOSIS — C7989 Secondary malignant neoplasm of other specified sites: Secondary | ICD-10-CM | POA: Insufficient documentation

## 2022-09-06 DIAGNOSIS — C562 Malignant neoplasm of left ovary: Secondary | ICD-10-CM

## 2022-09-06 DIAGNOSIS — C563 Malignant neoplasm of bilateral ovaries: Secondary | ICD-10-CM | POA: Diagnosis not present

## 2022-09-06 DIAGNOSIS — Z7189 Other specified counseling: Secondary | ICD-10-CM | POA: Diagnosis not present

## 2022-09-06 DIAGNOSIS — C786 Secondary malignant neoplasm of retroperitoneum and peritoneum: Secondary | ICD-10-CM | POA: Insufficient documentation

## 2022-09-06 LAB — TOTAL PROTEIN, URINE DIPSTICK: Protein, ur: NEGATIVE mg/dL

## 2022-09-06 LAB — CBC WITH DIFFERENTIAL (CANCER CENTER ONLY)
Abs Immature Granulocytes: 0.01 10*3/uL (ref 0.00–0.07)
Basophils Absolute: 0 10*3/uL (ref 0.0–0.1)
Basophils Relative: 0 %
Eosinophils Absolute: 0.1 10*3/uL (ref 0.0–0.5)
Eosinophils Relative: 2 %
HCT: 37.1 % (ref 36.0–46.0)
Hemoglobin: 12.9 g/dL (ref 12.0–15.0)
Immature Granulocytes: 0 %
Lymphocytes Relative: 27 %
Lymphs Abs: 1.2 10*3/uL (ref 0.7–4.0)
MCH: 32.9 pg (ref 26.0–34.0)
MCHC: 34.8 g/dL (ref 30.0–36.0)
MCV: 94.6 fL (ref 80.0–100.0)
Monocytes Absolute: 0.4 10*3/uL (ref 0.1–1.0)
Monocytes Relative: 8 %
Neutro Abs: 2.9 10*3/uL (ref 1.7–7.7)
Neutrophils Relative %: 63 %
Platelet Count: 166 10*3/uL (ref 150–400)
RBC: 3.92 MIL/uL (ref 3.87–5.11)
RDW: 13 % (ref 11.5–15.5)
WBC Count: 4.6 10*3/uL (ref 4.0–10.5)
nRBC: 0 % (ref 0.0–0.2)

## 2022-09-06 NOTE — Assessment & Plan Note (Signed)
I have reviewed multiple imaging studies with the patient Unfortunately, she has clinical signs of disease progression She is not symptomatic Previously, she had been treated with various different combination with good success of getting her disease under control We discussed risk, benefits, side effects of combination of carboplatin with paclitaxel versus carboplatin with gemcitabine versus carboplatin with Doxil Recommend the patient to consider different side effects to be expected At the end of her discussion, I would like the patient to take some time to discuss next steps with her family She will call me back tomorrow for final decision

## 2022-09-06 NOTE — Progress Notes (Signed)
[facility] OFFICE PROGRESS NOTE  Patient Care Team: Aronson, Richard, MD as PCP - General (Internal Medicine) Leanora IGeoffry Paradise Registered Nurse  ASSESSMENT & PLAN:  Left ovarian epithelial cancer [facility] I have reviewed multiple imaging studies with the patient Unfortunately, she has clinical signs of disease progression She is not symptomatic Previously, she had been treated with various different combination with good success of getting her disease under control We discussed risk, benefits, side effects of combination of carboplatin with paclitaxel versus carboplatin with gemcitabine versus carboplatin with Doxil Recommend the patient to consider different side effects to be expected At the end of her discussion, I would like the patient to take some time to discuss next steps with her family She will call me back tomorrow for final decision  No orders of the defined types were placed in this encounter.   All questions were answered. The patient knows to call the clinic with any problems, questions or concerns. The total time spent in the appointment was 40 minutes encounter with patients including review of chart and various tests results, discussions about plan of care and coordination of care plan   Artis Delay, MD 09/06/2022 10:56 AM  INTERVAL HISTORY: Please see below for problem oriented charting. she returns for treatment follow-up We discussed CT imaging result She denies abdominal pain, bloating or changes in bowel habits We spent majority of our time reviewing different treatment options  REVIEW OF SYSTEMS:   Constitutional: Denies fevers, chills or abnormal weight loss Eyes: Denies blurriness of vision Ears, nose, mouth, throat, and face: Denies mucositis or sore throat Respiratory: Denies cough, dyspnea or wheezes Cardiovascular: Denies palpitation, chest discomfort or lower extremity swelling Gastrointestinal:  Denies nausea, heartburn or  change in bowel habits Skin: Denies abnormal skin rashes Lymphatics: Denies new lymphadenopathy or easy bruising Neurological:Denies numbness, tingling or new weaknesses Behavioral/Psych: Mood is stable, no new changes  All other systems were reviewed with the patient and are negative.  I have reviewed the past medical history, past surgical history, social history and family history with the patient and they are unchanged from previous note.  ALLERGIES:  is allergic to skin adhesives [cyanoacrylate], shrimp [shellfish allergy], and sulfonamide derivatives.  MEDICATIONS:  Current Outpatient Medications  Medication Sig Dispense Refill   ALPRAZolam (XANAX) 0.25 MG tablet Take 1 tablet by mouth at bedtime as needed for anxiety. 30 tablet 0   carboxymethylcellulose (REFRESH PLUS) 0.5 % SOLN Place 1 drop into both eyes 2 (two) times daily as needed (dry eyes).     Cholecalciferol (VITAMIN D3) 25 MCG (1000 UT) CAPS Take 1,000 Units by mouth daily.      esomeprazole (NEXIUM) 20 MG capsule Take 20 mg by mouth daily.      estradiol (ESTRACE VAGINAL) 0.1 MG/GM vaginal cream Place 1 applicatorful vaginally 3 (three) times a week. 42.5 g 12   furosemide (LASIX) 20 MG tablet Take 1 tablet (20 mg total) by mouth daily. 30 tablet 1   lidocaine-prilocaine (EMLA) cream Apply topically as needed. 30 g 0   lisinopril (ZESTRIL) 20 MG tablet Take 1 tablet (20 mg total) by mouth daily. 90 tablet 1   loratadine (CLARITIN) 10 MG tablet Take 10 mg by mouth daily as needed for allergies.     Multiple Vitamin (MULTIVITAMIN WITH MINERALS) TABS tablet Take 1 tablet by mouth daily.     ondansetron (ZOFRAN) 8 MG tablet Take 1 tablet by mouth 2 times daily as needed. Start on the  third day after chemotherapy. 30 tablet 1   prochlorperazine (COMPAZINE) 10 MG tablet Take 1 tablet by mouth every 6 hours as needed (Nausea or vomiting). 30 tablet 1   No current facility-administered medications for this visit.    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 14.7 cm poorly defined soft tissue mass in central pelvis suspicious for primary ovarian carcinoma, with uterine leiomyosarcoma considered a less likely differential diagnosis. 2. Moderate ascites and diffuse peritoneal carcinomatosis. 3. Several small uterine fibroids.   11/13/2018 Tumor Marker   Patient's tumor was tested for the following markers: CA-125 Results of the tumor marker test revealed 1434   11/21/2018 Procedure   Successful ultrasound-guided diagnostic and therapeutic paracentesis yielding 3.1 liters of peritoneal fluid   11/21/2018 Pathology Results   PERITONEAL/ASCITIC FLUID(SPECIMEN 1 OF 1 COLLECTED 11/21/18): ADENOCARCINOMA. Specimen Clinical Information Pelvic mass suspicious for ovarian cancer Source Peritoneal/Ascitic Fluid, (specimen 1 of 1 collected 11/21/18) Gross Specimen: Received is/are 1000 ccs of dark amber fluid. (CM:cm) Prepared: # Smears: 0 # Concentration Technique Slides (i.e. 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   11/29/2018 Initial Diagnosis   Ovarian cancer (HCC)   11/29/2018 Cancer Staging   Staging form: Ovary, Fallopian Tube, and Primary Peritoneal Carcinoma, AJCC 8th Edition - Clinical: cT3, cN0, cM0 - Signed by Artis Delay, MD on 11/29/2018   12/03/2018 Procedure   Successful ultrasound-guided therapeutic paracentesis yielding 3.7 liters of peritoneal fluid.     12/06/2018 Procedure   Placement of a subcutaneous port device. Catheter tip at the SVC and right atrium junction   12/14/2018 Procedure   Successful ultrasound-guided paracentesis yielding 2.9 L of peritoneal fluid   12/28/2018 Tumor Marker   Patient's tumor was tested for the  following markers: CA-125 Results of the tumor marker test revealed 812.   12/28/2018 - 04/22/2019 Chemotherapy   The patient had carboplatin and taxol for neoadjuvant treatment, followed by interval debulking surgery and subsequent adjuvant chemotherapy treatment.     02/01/2019 Imaging   Ct abdomen and pelvis 8.6 cm left ovarian mass, corresponding to the patient's known primary neoplasm, improved.   Mild peritoneal nodularity/omental caking, improved.   Small abdominopelvic ascites, improved.     02/01/2019 Tumor Marker   Patient's tumor was tested for the following markers: CA-125 Results of the tumor marker test revealed 183.   02/12/2019 Surgery   Preoperative Diagnosis: Stage IIIC ovarian cancer, s/p neoadjuvant chemotherapy      Procedure(s) Performed: 1. Exploratory laparotomy with total abdominal hysterectomy, bilateral salpingo-oophorectomy, omentectomy radical tumor debulking for ovarian cancer.   Surgeon: Luisa Dago, MD.    Operative Findings: upper abdomen free of disease. No visible omental disease. Small volume (200cc) ascites. 8cm friable mass replacing left ovary and adherent to the sigmoid colon mesentery and ureter on the left.  Anterior fibroid. This represented an optimal cytoreduction (R0) with no gross visible disease remaining.    02/12/2019 Pathology Results   A. OVARY AND FALLOPIAN TUBE, LEFT, SALPINGO-OOPHORECTOMY: - Serous carcinoma, high grade, status post neoadjuvant therapy - See oncology table and comment below B. UTERUS CERVIX WITH RIGHT FALLOPIAN TUBE AND OVARY, HYSTERECTOMY: Uterus: - Serosal surface involved by serous carcinoma - Endomyometrium uninvolved by carcinoma - Benign endometrial polyp (4.1 cm) - Leiomyomata (5.5 cm;  largest) - Adenomyosis Cervix: - Uninvolved by carcinoma Left ovary: - Serous carcinoma, high grade Left fallopian tube: - Serous carcinoma, high grade C. SOFT TISSUE, LEFT PELVIC SIDEWALL TUMOR, EXCISION: -  Metastatic serous carcinoma, high-grade D. SOFT TISSUE, SIGMOID COLON MESENTERY, EXCISION: - Metastatic serous carcinoma, high-grade E. OMENTUM, TUMOR RESECTION: - Metastatic serous carcinoma, high-grade OVARY or FALLOPIAN TUBE or PRIMARY PERITONEUM: Procedure: Total hysterectomy and bilateral salpingo-oophorectomy, omentectomy and peritoneal biopsies Specimen Integrity: Fragmented Tumor Site: Left ovary and fallopian tube Ovarian Surface Involvement: Present Fallopian Tube Surface Involvement: Present Tumor Size: 6.3 cm (see comment) Histologic Type: Serous carcinoma Histologic Grade: High-grade Implants: Not applicable Other Tissue/ Organ Involvement: Right ovary, right fallopian tube, omentum, mesentery Largest Extrapelvic Peritoneal Focus: Macroscopic Peritoneal/Ascitic Fluid: Malignant Treatment Effect: No definite or minimal response identified (chemotherapy response score 1 [CRS1] Regional Lymph Nodes: No lymph nodes submitted or found Pathologic Stage Classification (pTNM, AJCC 8th Edition): pT3c, pNX Representative Tumor Block: A4 Comment(s): Additional testing (HER-2, MMR and MSI) are pending. The primary tumor site appears to be the left ovary and fallopian tube. The uterus is only involved on the serosal surface. There is tumor on the anterior peritoneal reflection.  Addendum: Tumor is Her2 negative,MSI stable   02/19/2019 Tumor Marker   Patient's tumor was tested for the following markers: CA-125 Results of the tumor marker test revealed 40.3   04/01/2019 Tumor Marker   Patient's tumor was tested for the following markers: CA-125 Results of the tumor marker test revealed 8.1    Genetic Testing   Negative testing. No pathogenic variants identified on the [employer]. The report date is 04/17/2019.  Somatic genes analyzed through [facility]: ATM, BARD1, BRCA1, BRCA2, BRIP1, CHEK2, MRE11A, NBN, PALB2, RAD51C, RAD51D.  The [facility] gene panel  offered by [employer] includes sequencing and rearrangement analysis for the following 36 genes: APC*, ATM*, AXIN2, BARD1, BMPR1A, BRCA1*, BRCA2*, BRIP1*, CDH1*, CDK4, CDKN2A, CHEK2*, DICER1, MLH1*, MSH2*, MSH3, MSH6*, MUTYH*, NBN, NF1*, NTHL1, PALB2*, PMS2*, PTEN*, RAD51C*, RAD51D*, RECQL, SMAD4, SMARCA4, STK11 and TP53* (sequencing and deletion/duplication); HOXB13, POLD1 and POLE (sequencing only); EPCAM and GREM1 (deletion/duplication only).    05/21/2019 Imaging   1. Interval hysterectomy, oophorectomy and omentectomy. No evidence of residual ovarian carcinoma in the pelvis. No evidence of peritoneal disease. No intraperitoneal free fluid. 2. Nonobstructing LEFT renal calculi.  Normal ureters.   05/22/2019 Tumor Marker   Patient's tumor was tested for the following markers: CA-125 Results of the tumor marker test revealed 5.7.   06/03/2019 Procedure   Successful right IJ vein Port-A-Cath explant.   08/26/2019 Tumor Marker   Patient's tumor was tested for the following markers: CA-125. Results of the tumor marker test revealed 4.9   10/03/2019 Imaging   1. New tumor deposits along the capsular surfaces of the liver, spleen, in the left pelvis, and right paracolic gutter, compatible with metastatic disease. 2. Mild dilation of the dorsal pancreatic duct in the pancreatic body and head, cause uncertain. 3. Nonobstructive left nephrolithiasis. 4. Aortic atherosclerosis.   Aortic Atherosclerosis (ICD10-I70.0   10/10/2019 Procedure   Successful placement of a right IJ approach Power Port with ultrasound and fluoroscopic guidance. The catheter is ready for use.     10/11/2019 Tumor Marker   Patient's tumor was tested for the following markers: CA-125 Results of the tumor marker test revealed 19.6.   10/14/2019 -  Chemotherapy   The patient had carboplatin and gemzar for chemotherapy treatment.     11/04/2019 Tumor Marker   Patient's  tumor was tested for the following markers:  CA-125 Results of the tumor marker test revealed 11.4   11/28/2019 Tumor Marker   Patient's tumor was tested for the following markers: CA-125 Results of the tumor marker test revealed 5.8   12/20/2019 Imaging   1. Significant interval reduction in mixed solid and cystic nodule in the vicinity of the left ovary, with significant improvement or resolution of multiple pelvic, peritoneal, and organ capsule implants. Resolution of previously seen small volume ascites. Findings are consistent with treatment response of abdominal metastatic disease. 2. Status post hysterectomy, oophorectomy, and omentectomy. 3. Nonobstructive left nephrolithiasis. 4. Aortic Atherosclerosis (ICD10-I70.0).   12/23/2019 Tumor Marker   Patient's tumor was tested for the following markers: CA-125 Results of the tumor marker test revealed 5.0   01/24/2020 Tumor Marker   Patient's tumor was tested for the following markers: CA-125 Results of the tumor marker test revealed 4.8   02/24/2020 Tumor Marker   Patient's tumor was tested for the following markers: CA-125. Results of the tumor marker test revealed 4.3.   03/06/2020 Imaging   1. Status post hysterectomy, bilateral oophorectomy, and omentectomy. 2. Resolution of previously described left adnexal nodularity. 3. No residual disease identified. 4.  Aortic Atherosclerosis (ICD10-I70.0). 5. Left nephrolithiasis.   04/20/2020 Tumor Marker   Patient's tumor was tested for the following markers: CA-125 Results of the tumor marker test revealed 4.6   05/18/2020 Tumor Marker   Patient's tumor was tested for the following markers: CA-125 Results of the tumor marker test revealed 4.7   05/29/2020 Imaging   Status post hysterectomy and bilateral salpingo oophorectomy.   No evidence of recurrent or metastatic disease.   06/08/2020 - 09/13/2021 Chemotherapy   She was on niraparib       06/17/2020 Tumor Marker   Patient's tumor was tested for the following markers:  CA-125 Results of the tumor marker test revealed 4.5   08/04/2020 Tumor Marker   Patient's tumor was tested for the following markers: CA-125. Results of the tumor marker test revealed 5.7   09/07/2020 Imaging   1. Status post hysterectomy and bilateral salpingo oophorectomy. No evidence of recurrent or metastatic disease. 2. Nonobstructing left nephrolithiasis. 3. Left-sided colonic diverticulosis without findings of acute diverticulitis. 4. Aortic atherosclerosis.     09/07/2020 Tumor Marker   Patient's tumor was tested for the following markers: CA-125 Results of the tumor marker test revealed 4.8.   10/21/2020 Tumor Marker   Patient's tumor was tested for the following markers: CA-125. Results of the tumor marker test revealed 5.1.   12/14/2020 Tumor Marker   Patient's tumor was tested for the following markers: CA-125. Results of the tumor marker test revealed 5.5.   04/05/2021 Tumor Marker   Patient's tumor was tested for the following markers: CA-125. Results of the tumor marker test revealed 5.9.   05/31/2021 Tumor Marker   Patient's tumor was tested for the following markers: CA-125. Results of the tumor marker test revealed 8.1.   09/13/2021 Imaging   1. Interval development of multiple new cystic and solid lesions in the abdomen and pelvis measuring up to 10.3 x 7.2 cm and consistent with peritoneal metastatic disease. 2. Nonobstructing left renal stones. 3. Aortic Atherosclerosis (ICD10-I70.0).     09/14/2021 Tumor Marker   Patient's tumor was tested for the following markers: CA-125. Results of the tumor marker test revealed 28.5.   09/16/2021 Echocardiogram   1. Left ventricular ejection fraction, by estimation, is 60 to 65%. The left ventricle has  normal function. The left ventricle has no regional wall motion abnormalities. Left ventricular diastolic parameters are consistent with Grade I diastolic dysfunction (impaired relaxation).  2. Right ventricular systolic  function is normal. The right ventricular size is normal.  3. The mitral valve is normal in structure. Trivial mitral valve regurgitation. No evidence of mitral stenosis.  4. The aortic valve has an indeterminant number of cusps. Aortic valve regurgitation is trivial. No aortic stenosis is present.  5. The inferior vena cava is normal in size with greater than 50% respiratory variability, suggesting right atrial pressure of 3 mmHg.     09/20/2021 - 01/17/2022 Chemotherapy   Patient is on Treatment Plan : OVARIAN RECURRENT Liposomal Doxorubicin + Carboplatin q28d X 6 Cycles     09/20/2021 - 02/22/2022 Chemotherapy   Patient is on Treatment Plan : OVARIAN RECURRENT Liposomal Doxorubicin + Carboplatin q28d X 6 Cycles     09/23/2021 Tumor Marker   Patient's tumor was tested for the following markers: CA-125. Results of the tumor marker test revealed 27.7.   11/24/2021 Tumor Marker   Patient's tumor was tested for the following markers: CA-125. Results of the tumor marker test revealed 28.4.   12/13/2021 Imaging   1. Overall stable peritoneal carcinomatosis. No new disease is demonstrated. 2. Moderate left-sided hydroureteronephrosis likely due to compression of the left ureter by the left pelvic mass. 3. Stable left-sided renal calculi. 4. Numerous left renal calculi.     12/22/2021 Tumor Marker   Patient's tumor was tested for the following markers: CA-125. Results of the tumor marker test revealed 24.7.   01/18/2022 Tumor Marker   Patient's tumor was tested for the following markers: CA-125. Results of the tumor marker test revealed 9.9.   01/27/2022 Echocardiogram    1. Left ventricular ejection fraction, by estimation, is 60 to 65%. The left ventricle has normal function. The left ventricle has no regional wall motion abnormalities. Left ventricular diastolic parameters are consistent with Grade I diastolic dysfunction (impaired relaxation). The average left ventricular global longitudinal strain  is -21.1 %. The global longitudinal strain is normal.  2. Right ventricular systolic function is normal. The right ventricular size is normal.  3. The mitral valve is abnormal. Mild mitral valve regurgitation.  4. The aortic valve is tricuspid. Aortic valve regurgitation is trivial.  5. The inferior vena cava is normal in size with greater than 50% respiratory variability, suggesting right atrial pressure of 3 mmHg.   02/23/2022 Tumor Marker   Patient's tumor was tested for the following markers: CA-125. Results of the tumor marker test revealed 8.5.   03/18/2022 Imaging   1. Diminished size of nodular and mixed solid and cystic peritoneal implants in the hepatorenal fossa, right lower quadrant, and left pelvis. 2. Unchanged solid mass in the right pelvis measuring 4.0 x 3.7 cm. 3. Constellation of findings is overall consistent with treatment response 4. Status post hysterectomy, oophorectomy, and omentectomy. 5. Nonobstructive left nephrolithiasis.   Aortic Atherosclerosis (ICD10-I70.0).     03/22/2022 -  Chemotherapy   Patient is on Treatment Plan : Ovarian Bevacizumab q21d     03/24/2022 Tumor Marker   Patient's tumor was tested for the following markers: CA-125. Results of the tumor marker test revealed 7.1.   04/28/2022 Tumor Marker   Patient's tumor was tested for the following markers: CA-125. Results of the tumor marker test revealed 7.0.   05/27/2022 Tumor Marker   Patient's tumor was tested for the following markers: CA-125. Results of the tumor marker  test revealed 7.7.   06/14/2022 Imaging   1. Slight response to therapy as evidenced by decrease in size of a cystic and solid mass in the left adnexa as well as peritoneal implants in the right paracolic gutter. Solid right adnexal mass and additional peritoneal implants along right hepatic lobe and in the left anatomic pelvis appear stable. 2. Hepatic steatosis. 3. Left renal stones. 4.  Aortic atherosclerosis  (ICD10-I70.0).   06/17/2022 Tumor Marker   Patient's tumor was tested for the following markers: CA-125. Results of the tumor marker test revealed 11.2.   09/05/2022 Imaging   CT ABDOMEN PELVIS W CONTRAST  Result Date: 09/06/2022 CLINICAL DATA:  Ovarian cancer.  Restaging.  * Tracking Code: BO * EXAM: CT ABDOMEN AND PELVIS WITH CONTRAST TECHNIQUE: Multidetector CT imaging of the abdomen and pelvis was performed using the standard protocol following bolus administration of intravenous contrast. RADIATION DOSE REDUCTION: This exam was performed according to the departmental dose-optimization program which includes automated exposure control, adjustment of the mA and/or kV according to patient size and/or use of iterative reconstruction technique. CONTRAST:  OMNIPAQUE IOHEXOL 300 MG/ML  SOLN COMPARISON:  06/13/2022 FINDINGS: Lower chest: Unremarkable. Hepatobiliary: No suspicious focal abnormality within the liver parenchyma. There is no evidence for gallstones, gallbladder wall thickening, or pericholecystic fluid. No intrahepatic or extrahepatic biliary dilation. Pancreas: No pancreatic mass lesion evident. Diffuse prominence of the main pancreatic duct is stable measuring up to 3-4 mm diameter in the head of the pancreas. Spleen: No splenomegaly. No focal mass lesion. Adrenals/Urinary Tract: No adrenal nodule or mass. Right kidney unremarkable. A cluster of nonobstructing stones in the upper pole right kidney is again noted with dominant stone measuring about 6 mm in size. No evidence for hydroureter. The urinary bladder appears normal for the degree of distention. Stomach/Bowel: Tiny hiatal hernia. Stomach otherwise unremarkable. Duodenum is normally positioned as is the ligament of Treitz. No small bowel wall thickening. No small bowel dilatation. The terminal ileum is normal. The appendix is normal. No gross colonic mass. No colonic wall thickening. Diverticular changes are noted in the left colon  without evidence of diverticulitis. Vascular/Lymphatic: There is mild atherosclerotic calcification of the abdominal aorta without aneurysm. There is no gastrohepatic or hepatoduodenal ligament lymphadenopathy. No retroperitoneal or mesenteric lymphadenopathy. No pelvic sidewall lymphadenopathy. Reproductive: Right-sided adnexal mass measured previously at 4.2 x 3.8 cm is now 4.9 x 4.6 cm. Cystic and solid left adnexal lesion measured previously at 4.1 x 3.4 cm is now 3.2 x 2.9 cm. 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 The more anterior of the 2 index lesions measures 2.6 x 2.1 cm today compared to 2.4 x 1.6 cm previously. The more posterior lesion is 2.4 x 1.4 cm today compared to 2.0 x 1.9 cm previously. 5 mm soft tissue nodule identified in the inferior paracolic gutter previously at 5 mm measures 10 mm today on image 51/2. Implants along the left posterior peritoneum adjacent to the psoas muscle and iliac vessels appear progressive. 1.6 x 1.3 cm lesion visible on 58/2 was 1.1 x 0.8 cm previously (remeasured). No evidence for ascites. Musculoskeletal: No worrisome lytic or sclerotic osseous abnormality. IMPRESSION: 1. Overall generalized appearance of mild disease progression. 2. Index right-sided adnexal mass has progressed since the prior with interval decrease in size of the cystic and solid left adnexal lesion. 3. Interval progression of index capsular implants along the medial right liver. 4. Interval progression of soft tissue nodule in the inferior right  paracolic gutter and peritoneal implants in the lower left abdomen and pelvis. 5. No evidence for ascites. 6. Nonobstructing left renal stones. 7. Tiny hiatal hernia. 8. Left colonic diverticulosis without diverticulitis. 9.  Aortic Atherosclerosis (ICD10-I70.0). Electronically Signed   By: Kennith Center M.D.   On: 09/06/2022 07:57        PHYSICAL EXAMINATION: ECOG PERFORMANCE STATUS: 0 - Asymptomatic  Vitals:    09/06/22 0819  BP: (!) 148/80  Pulse: 87  Resp: 18  Temp: 97.9 F (36.6 C)  SpO2: 100%   Filed Weights   09/06/22 0819  Weight: 148 lb 6.4 oz (67.3 kg)    GENERAL:alert, no distress and comfortable NEURO: alert & oriented x 3 with fluent speech, no focal motor/sensory deficits  LABORATORY DATA:  I have reviewed the data as listed    Component Value Date/Time   NA 137 08/16/2022 0734   K 3.7 08/16/2022 0734   CL 104 08/16/2022 0734   CO2 26 08/16/2022 0734   GLUCOSE 99 08/16/2022 0734   BUN 15 08/16/2022 0734   CREATININE 0.60 08/16/2022 0734   CREATININE 0.54 05/26/2022 0916   CALCIUM 8.6 (L) 08/16/2022 0734   PROT 6.3 (L) 08/16/2022 0734   ALBUMIN 4.1 08/16/2022 0734   AST 42 (H) 08/16/2022 0734   AST 48 (H) 05/26/2022 0916   ALT 43 08/16/2022 0734   ALT 39 05/26/2022 0916   ALKPHOS 83 08/16/2022 0734   BILITOT 1.6 (H) 08/16/2022 0734   BILITOT 1.5 (H) 05/26/2022 0916   GFRNONAA >60 08/16/2022 0734   GFRNONAA >60 05/26/2022 0916   GFRAA >60 02/24/2020 0852    No results found for: "SPEP", "UPEP"  Lab Results  Component Value Date   WBC 4.6 09/06/2022   NEUTROABS 2.9 09/06/2022   HGB 12.9 09/06/2022   HCT 37.1 09/06/2022   MCV 94.6 09/06/2022   PLT 166 09/06/2022      Chemistry      Component Value Date/Time   NA 137 08/16/2022 0734   K 3.7 08/16/2022 0734   CL 104 08/16/2022 0734   CO2 26 08/16/2022 0734   BUN 15 08/16/2022 0734   CREATININE 0.60 08/16/2022 0734   CREATININE 0.54 05/26/2022 0916      Component Value Date/Time   CALCIUM 8.6 (L) 08/16/2022 0734   ALKPHOS 83 08/16/2022 0734   AST 42 (H) 08/16/2022 0734   AST 48 (H) 05/26/2022 0916   ALT 43 08/16/2022 0734   ALT 39 05/26/2022 0916   BILITOT 1.6 (H) 08/16/2022 0734   BILITOT 1.5 (H) 05/26/2022 0916       RADIOGRAPHIC STUDIES: I have reviewed multiple imaging studies with the patient I have personally reviewed the radiological images as listed and agreed with the findings in  the report. CT ABDOMEN PELVIS W CONTRAST  Result Date: 09/06/2022 CLINICAL DATA:  Ovarian cancer.  Restaging.  * Tracking Code: BO * EXAM: CT ABDOMEN AND PELVIS WITH CONTRAST TECHNIQUE: Multidetector CT imaging of the abdomen and pelvis was performed using the standard protocol following bolus administration of intravenous contrast. RADIATION DOSE REDUCTION: This exam was performed according to the departmental dose-optimization program which includes automated exposure control, adjustment of the mA and/or kV according to patient size and/or use of iterative reconstruction technique. CONTRAST:  OMNIPAQUE IOHEXOL 300 MG/ML  SOLN COMPARISON:  06/13/2022 FINDINGS: Lower chest: Unremarkable. Hepatobiliary: No suspicious focal abnormality within the liver parenchyma. There is no evidence for gallstones, gallbladder wall thickening, or pericholecystic fluid. No intrahepatic or  extrahepatic biliary dilation. Pancreas: No pancreatic mass lesion evident. Diffuse prominence of the main pancreatic duct is stable measuring up to 3-4 mm diameter in the head of the pancreas. Spleen: No splenomegaly. No focal mass lesion. Adrenals/Urinary Tract: No adrenal nodule or mass. Right kidney unremarkable. A cluster of nonobstructing stones in the upper pole right kidney is again noted with dominant stone measuring about 6 mm in size. No evidence for hydroureter. The urinary bladder appears normal for the degree of distention. Stomach/Bowel: Tiny hiatal hernia. Stomach otherwise unremarkable. Duodenum is normally positioned as is the ligament of Treitz. No small bowel wall thickening. No small bowel dilatation. The terminal ileum is normal. The appendix is normal. No gross colonic mass. No colonic wall thickening. Diverticular changes are noted in the left colon without evidence of diverticulitis. Vascular/Lymphatic: There is mild atherosclerotic calcification of the abdominal aorta without aneurysm. There is no gastrohepatic or  hepatoduodenal ligament lymphadenopathy. No retroperitoneal or mesenteric lymphadenopathy. No pelvic sidewall lymphadenopathy. Reproductive: Right-sided adnexal mass measured previously at 4.2 x 3.8 cm is now 4.9 x 4.6 cm. Cystic and solid left adnexal lesion measured previously at 4.1 x 3.4 cm is now 3.2 x 2.9 cm. 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 The more anterior of the 2 index lesions measures 2.6 x 2.1 cm today compared to 2.4 x 1.6 cm previously. The more posterior lesion is 2.4 x 1.4 cm today compared to 2.0 x 1.9 cm previously. 5 mm soft tissue nodule identified in the inferior paracolic gutter previously at 5 mm measures 10 mm today on image 51/2. Implants along the left posterior peritoneum adjacent to the psoas muscle and iliac vessels appear progressive. 1.6 x 1.3 cm lesion visible on 58/2 was 1.1 x 0.8 cm previously (remeasured). No evidence for ascites. Musculoskeletal: No worrisome lytic or sclerotic osseous abnormality. IMPRESSION: 1. Overall generalized appearance of mild disease progression. 2. Index right-sided adnexal mass has progressed since the prior with interval decrease in size of the cystic and solid left adnexal lesion. 3. Interval progression of index capsular implants along the medial right liver. 4. Interval progression of soft tissue nodule in the inferior right paracolic gutter and peritoneal implants in the lower left abdomen and pelvis. 5. No evidence for ascites. 6. Nonobstructing left renal stones. 7. Tiny hiatal hernia. 8. Left colonic diverticulosis without diverticulitis. 9.  Aortic Atherosclerosis (ICD10-I70.0). Electronically Signed   By: Kennith Center M.D.   On: 09/06/2022 07:57

## 2022-09-07 ENCOUNTER — Other Ambulatory Visit: Payer: Self-pay | Admitting: Hematology and Oncology

## 2022-09-07 ENCOUNTER — Encounter: Payer: Self-pay | Admitting: Hematology and Oncology

## 2022-09-07 ENCOUNTER — Other Ambulatory Visit (HOSPITAL_COMMUNITY): Payer: Self-pay

## 2022-09-07 ENCOUNTER — Telehealth: Payer: Self-pay | Admitting: Oncology

## 2022-09-07 ENCOUNTER — Telehealth: Payer: Self-pay | Admitting: Hematology and Oncology

## 2022-09-07 DIAGNOSIS — C562 Malignant neoplasm of left ovary: Secondary | ICD-10-CM

## 2022-09-07 DIAGNOSIS — Z7189 Other specified counseling: Secondary | ICD-10-CM

## 2022-09-07 MED ORDER — DEXAMETHASONE 4 MG PO TABS
ORAL_TABLET | ORAL | 6 refills | Status: DC
  Filled 2022-09-07: qty 24, 18d supply, fill #0

## 2022-09-07 NOTE — Telephone Encounter (Signed)
Spoke with patient confirming upcoming appointment  

## 2022-09-07 NOTE — Progress Notes (Signed)
DISCONTINUE OFF PATHWAY REGIMEN - Ovarian   [id number]:Bevacizumab 15 mg/kg IV D1 q21 Days:   A cycle is every 21 days:     Bevacizumab-xxxx   **Always confirm dose/schedule in your pharmacy ordering system**  REASON: Disease Progression PRIOR TREATMENT: Off Pathway: Bevacizumab 15 mg/kg IV D1 q21 Days TREATMENT RESPONSE: Progressive Disease (PD)  START ON PATHWAY REGIMEN - Ovarian     A cycle is every 21 days:     Paclitaxel      Carboplatin   **Always confirm dose/schedule in your pharmacy ordering system**  Patient Characteristics: Recurrent or Progressive Disease, Third Line, Platinum Sensitive and ? 6 Months Since Last Platinum Therapy BRCA Mutation Status: Absent Therapeutic Status: Recurrent or Progressive Disease Line of Therapy: Third Line  Intent of Therapy: Non-Curative / Palliative Intent, Discussed with Patient

## 2022-09-07 NOTE — Telephone Encounter (Signed)
Called Sandy back and advised her of note below from Dr. [name].  She verbalized understanding and agreement and knows that the schedulers will call her with appointment times.

## 2022-09-07 NOTE — Telephone Encounter (Signed)
[facility] and she has decided on carbo/Taxol for treatment.  She is concerned about peripheral neuropathy because she did experience this with her first treatments. She has heard other patients talking about cold therapy gloves from [employer].  She is wondering if these help at all or if there is anything else recommended.

## 2022-09-07 NOTE — Telephone Encounter (Signed)
I will put in orders and see her before treatment I recommend all patients to do cryotherapy using ice Remind her  1) pick up dex to be taken before chemo 2) bring a few shopping/grocery bags to her appt Scheduler will call her

## 2022-09-08 ENCOUNTER — Inpatient Hospital Stay: Payer: Medicare Other | Admitting: Licensed Clinical Social Worker

## 2022-09-08 ENCOUNTER — Other Ambulatory Visit: Payer: Self-pay

## 2022-09-08 ENCOUNTER — Encounter: Payer: Self-pay | Admitting: Oncology

## 2022-09-08 NOTE — Progress Notes (Signed)
[facility] Clinical Social Work  Clinical Social Work was referred by medical provider for assessment of psychosocial needs.  Clinical Social Worker contacted patient by phone  to offer support and assess for needs.   Patient reports that she received news that "took her legs out" from under her on Tuesday and that she is trying to process. Prior to that, she felt she was doing well and aims to be able to help others. She is potentially interested in taking part in support groups and programs, although will likely wait until after she has started chemo next week since she is currently trying to get organized before that.  Pt is involved in her church and has supportive friends and family.   CSW added pt to GYN support group and general support services email lists. CSW will plan to see pt on 4/25 during first infusion.    Jasraj Lappe E Dacen Frayre, LCSW  Clinical Social Worker [facility]

## 2022-09-08 NOTE — Progress Notes (Signed)
Requested PD-L1, folate receptor alpha and ER on accession [phone] with [facility] via email.

## 2022-09-08 NOTE — Progress Notes (Signed)
Pharmacist Chemotherapy Monitoring - Initial Assessment    Anticipated start date: 09/15/22   The following has been reviewed per standard work regarding the patient's treatment regimen: The patient's diagnosis, treatment plan and drug doses, and organ/hematologic function Lab orders and baseline tests specific to treatment regimen  The treatment plan start date, drug sequencing, and pre-medications Prior authorization status  Patient's documented medication list, including drug-drug interaction screen and prescriptions for anti-emetics and supportive care specific to the treatment regimen The drug concentrations, fluid compatibility, administration routes, and timing of the medications to be used The patient's access for treatment and lifetime cumulative dose history, if applicable  The patient's medication allergies and previous infusion related reactions, if applicable   Changes made to treatment plan:  N/A  Follow up needed:  N/A   Demetrius Charity, RPH, 09/08/2022  3:28 PM

## 2022-09-09 ENCOUNTER — Other Ambulatory Visit: Payer: Self-pay

## 2022-09-09 LAB — SURGICAL PATHOLOGY

## 2022-09-12 ENCOUNTER — Encounter: Payer: Self-pay | Admitting: Hematology and Oncology

## 2022-09-13 ENCOUNTER — Encounter (HOSPITAL_COMMUNITY): Payer: Self-pay | Admitting: Hematology and Oncology

## 2022-09-14 MED FILL — Dexamethasone Sodium Phosphate Inj 100 MG/10ML: INTRAMUSCULAR | Qty: 1 | Status: AC

## 2022-09-14 MED FILL — Fosaprepitant Dimeglumine For IV Infusion 150 MG (Base Eq): INTRAVENOUS | Qty: 5 | Status: AC

## 2022-09-15 ENCOUNTER — Inpatient Hospital Stay (HOSPITAL_BASED_OUTPATIENT_CLINIC_OR_DEPARTMENT_OTHER): Payer: Medicare Other | Admitting: Hematology and Oncology

## 2022-09-15 ENCOUNTER — Inpatient Hospital Stay: Payer: Medicare Other

## 2022-09-15 ENCOUNTER — Other Ambulatory Visit (HOSPITAL_COMMUNITY): Payer: Self-pay

## 2022-09-15 ENCOUNTER — Inpatient Hospital Stay: Payer: Medicare Other | Admitting: Licensed Clinical Social Worker

## 2022-09-15 ENCOUNTER — Encounter: Payer: Self-pay | Admitting: Hematology and Oncology

## 2022-09-15 ENCOUNTER — Other Ambulatory Visit: Payer: Self-pay | Admitting: Hematology and Oncology

## 2022-09-15 VITALS — BP 147/87 | HR 88 | Temp 97.7°F | Resp 18 | Ht 62.0 in | Wt 144.0 lb

## 2022-09-15 DIAGNOSIS — C786 Secondary malignant neoplasm of retroperitoneum and peritoneum: Secondary | ICD-10-CM

## 2022-09-15 DIAGNOSIS — C562 Malignant neoplasm of left ovary: Secondary | ICD-10-CM

## 2022-09-15 DIAGNOSIS — Z7189 Other specified counseling: Secondary | ICD-10-CM

## 2022-09-15 DIAGNOSIS — F411 Generalized anxiety disorder: Secondary | ICD-10-CM | POA: Diagnosis not present

## 2022-09-15 DIAGNOSIS — Z5111 Encounter for antineoplastic chemotherapy: Secondary | ICD-10-CM | POA: Diagnosis not present

## 2022-09-15 LAB — CMP (CANCER CENTER ONLY)
ALT: 24 U/L (ref 0–44)
AST: 34 U/L (ref 15–41)
Albumin: 4.3 g/dL (ref 3.5–5.0)
Alkaline Phosphatase: 76 U/L (ref 38–126)
Anion gap: 7 (ref 5–15)
BUN: 15 mg/dL (ref 8–23)
CO2: 25 mmol/L (ref 22–32)
Calcium: 9.3 mg/dL (ref 8.9–10.3)
Chloride: 106 mmol/L (ref 98–111)
Creatinine: 0.59 mg/dL (ref 0.44–1.00)
GFR, Estimated: >60 mL/min (ref >60)
Glucose, Bld: 259 mg/dL — ABNORMAL HIGH (ref 70–99)
Potassium: 3.8 mmol/L (ref 3.5–5.1)
Sodium: 138 mmol/L (ref 135–145)
Total Bilirubin: 1.9 mg/dL — ABNORMAL HIGH (ref 0.3–1.2)
Total Protein: 6.7 g/dL (ref 6.5–8.1)

## 2022-09-15 LAB — CBC WITH DIFFERENTIAL (CANCER CENTER ONLY)
Abs Immature Granulocytes: 0.01 10*3/uL (ref 0.00–0.07)
Basophils Absolute: 0 10*3/uL (ref 0.0–0.1)
Basophils Relative: 0 %
Eosinophils Absolute: 0 10*3/uL (ref 0.0–0.5)
Eosinophils Relative: 0 %
HCT: 39.9 % (ref 36.0–46.0)
Hemoglobin: 13.9 g/dL (ref 12.0–15.0)
Immature Granulocytes: 0 %
Lymphocytes Relative: 7 %
Lymphs Abs: 0.4 10*3/uL — ABNORMAL LOW (ref 0.7–4.0)
MCH: 33.1 pg (ref 26.0–34.0)
MCHC: 34.8 g/dL (ref 30.0–36.0)
MCV: 95 fL (ref 80.0–100.0)
Monocytes Absolute: 0 10*3/uL — ABNORMAL LOW (ref 0.1–1.0)
Monocytes Relative: 1 %
Neutro Abs: 4.8 10*3/uL (ref 1.7–7.7)
Neutrophils Relative %: 92 %
Platelet Count: 165 10*3/uL (ref 150–400)
RBC: 4.2 MIL/uL (ref 3.87–5.11)
RDW: 13.2 % (ref 11.5–15.5)
WBC Count: 5.3 10*3/uL (ref 4.0–10.5)
nRBC: 0 % (ref 0.0–0.2)

## 2022-09-15 MED ORDER — FAMOTIDINE IN NACL 20-0.9 MG/50ML-% IV SOLN
20.0000 mg | Freq: Once | INTRAVENOUS | Status: AC
  Administered 2022-09-15: 20 mg via INTRAVENOUS
  Filled 2022-09-15: qty 50

## 2022-09-15 MED ORDER — SODIUM CHLORIDE 0.9 % IV SOLN
10.0000 mg | Freq: Once | INTRAVENOUS | Status: AC
  Administered 2022-09-15: 10 mg via INTRAVENOUS
  Filled 2022-09-15: qty 10

## 2022-09-15 MED ORDER — SODIUM CHLORIDE 0.9 % IV SOLN
150.0000 mg | Freq: Once | INTRAVENOUS | Status: AC
  Administered 2022-09-15: 150 mg via INTRAVENOUS
  Filled 2022-09-15: qty 150

## 2022-09-15 MED ORDER — PALONOSETRON HCL INJECTION 0.25 MG/5ML
0.2500 mg | Freq: Once | INTRAVENOUS | Status: AC
  Administered 2022-09-15: 0.25 mg via INTRAVENOUS
  Filled 2022-09-15: qty 5

## 2022-09-15 MED ORDER — SODIUM CHLORIDE 0.9% FLUSH
10.0000 mL | Freq: Once | INTRAVENOUS | Status: AC
  Administered 2022-09-15: 10 mL

## 2022-09-15 MED ORDER — CETIRIZINE HCL 10 MG/ML IV SOLN
10.0000 mg | Freq: Once | INTRAVENOUS | Status: AC
  Administered 2022-09-15: 10 mg via INTRAVENOUS
  Filled 2022-09-15: qty 1

## 2022-09-15 MED ORDER — ALPRAZOLAM 0.25 MG PO TABS
0.2500 mg | ORAL_TABLET | Freq: Every evening | ORAL | 0 refills | Status: DC | PRN
  Filled 2022-09-15: qty 30, 30d supply, fill #0

## 2022-09-15 MED ORDER — FUROSEMIDE 20 MG PO TABS
20.0000 mg | ORAL_TABLET | Freq: Every day | ORAL | 1 refills | Status: DC
  Filled 2022-09-15: qty 60, 60d supply, fill #0

## 2022-09-15 MED ORDER — HEPARIN SOD (PORK) LOCK FLUSH 100 UNIT/ML IV SOLN
500.0000 [IU] | Freq: Once | INTRAVENOUS | Status: AC | PRN
  Administered 2022-09-15: 500 [IU]

## 2022-09-15 MED ORDER — SODIUM CHLORIDE 0.9 % IV SOLN
131.2500 mg/m2 | Freq: Once | INTRAVENOUS | Status: AC
  Administered 2022-09-15: 228 mg via INTRAVENOUS
  Filled 2022-09-15: qty 37.6

## 2022-09-15 MED ORDER — SODIUM CHLORIDE 0.9 % IV SOLN: Freq: Once | INTRAVENOUS | Status: AC

## 2022-09-15 MED ORDER — SODIUM CHLORIDE 0.9 % IV SOLN
480.0000 mg | Freq: Once | INTRAVENOUS | Status: AC
  Administered 2022-09-15: 480 mg via INTRAVENOUS
  Filled 2022-09-15: qty 48

## 2022-09-15 MED ORDER — SODIUM CHLORIDE 0.9% FLUSH
10.0000 mL | INTRAVENOUS | Status: DC | PRN
  Administered 2022-09-15: 10 mL

## 2022-09-15 NOTE — Progress Notes (Signed)
Spoke w/ MD [name], will give Carboplatin dose of 480 mg. Dose calculated based on rounding serum creatinine to = 1 mg/dl.  Demetrius Charity, PharmD

## 2022-09-15 NOTE — Assessment & Plan Note (Signed)
We discussed again the risk and benefits of carboplatin and paclitaxel We discussed expected side effects from treatment Recommend cryotherapy to reduce risk of peripheral neuropathy I recommend minimum 3 cycles of therapy before repeat imaging studies

## 2022-09-15 NOTE — Progress Notes (Signed)
[facility] CSW Progress Note  Visual merchandiser met with patient to provide coping support during infusion. Pt experiencing anxiety today with restarting chemo (same as first round a few years ago). She shared her outlook of being realistic but positive. She plans to continue working if possible and still desires to help others. She is doing this in small ways by greeting others when she enters the waiting area for infusion. CSW provided space for pt to also share some of her story with her education and work.   CSW will continue to provide support during infusion visits. Encouraged pt to call as needed between visits and provided card with contact information as well as monthly programming calendar today.    Kylin Dubs E Nefertari Rebman, LCSW

## 2022-09-15 NOTE — Patient Instructions (Signed)
[facility]  Discharge Instructions: Thank you for choosing [facility] to provide your oncology and hematology care.   If you have a lab appointment with the Cancer Center, please go directly to the Cancer Center and check in at the registration area.   Wear comfortable clothing and clothing appropriate for easy access to any Portacath or PICC line.   We strive to give you quality time with your provider. You may need to reschedule your appointment if you arrive late (15 or more minutes).  Arriving late affects you and other patients whose appointments are after yours.  Also, if you miss three or more appointments without notifying the office, you may be dismissed from the clinic at the provider's discretion.      For prescription refill requests, have your pharmacy contact our office and allow 72 hours for refills to be completed.    Today you received the following chemotherapy and/or immunotherapy agents: Taxol, Carbo      To help prevent nausea and vomiting after your treatment, we encourage you to take your nausea medication as directed.  BELOW ARE SYMPTOMS THAT SHOULD BE REPORTED IMMEDIATELY: *FEVER GREATER THAN 100.4 F (38 C) OR HIGHER *CHILLS OR SWEATING *NAUSEA AND VOMITING THAT IS NOT CONTROLLED WITH YOUR NAUSEA MEDICATION *UNUSUAL SHORTNESS OF BREATH *UNUSUAL BRUISING OR BLEEDING *URINARY PROBLEMS (pain or burning when urinating, or frequent urination) *BOWEL PROBLEMS (unusual diarrhea, constipation, pain near the anus) TENDERNESS IN MOUTH AND THROAT WITH OR WITHOUT PRESENCE OF ULCERS (sore throat, sores in mouth, or a toothache) UNUSUAL RASH, SWELLING OR PAIN  UNUSUAL VAGINAL DISCHARGE OR ITCHING   Items with * indicate a potential emergency and should be followed up as soon as possible or go to the Emergency Department if any problems should occur.  Please show the CHEMOTHERAPY ALERT CARD or IMMUNOTHERAPY ALERT CARD at  check-in to the Emergency Department and triage nurse.  Should you have questions after your visit or need to cancel or reschedule your appointment, please contact [facility]  Dept: [phone]  and follow the prompts.  Office hours are 8:00 a.m. to 4:30 p.m. Monday - Friday. Please note that voicemails left after 4:00 p.m. may not be returned until the following business day.  We are closed weekends and major holidays. You have access to a nurse at all times for urgent questions. Please call the main number to the clinic Dept: [phone] and follow the prompts.   For any non-urgent questions, you may also contact your provider using MyChart. We now offer e-Visits for anyone 79 and older to request care online for non-urgent symptoms. For details visit [email or web address].   Also download the MyChart app! Go to the app store, search "MyChart", open the app, select , and log in with your MyChart username and password.  Masks are optional in the cancer centers. If you would like for your care team to wear a mask while they are taking care of you, please let them know. You may have one support person who is at least 69 years old accompany you for your appointments. Paclitaxel Injection What is this medication? PACLITAXEL (PAK li TAX el) treats some types of cancer. It works by slowing down the growth of cancer cells. This medicine may be used for other purposes; ask your health care provider or pharmacist if you have questions. COMMON BRAND NAME(S): Onxol, Taxol What should I tell my care team  before I take this medication? They need to know if you have any of these conditions: Heart disease Liver disease Low white blood cell levels An unusual or allergic reaction to paclitaxel, other medications, foods, dyes, or preservatives If you or your partner are pregnant or trying to get pregnant Breast-feeding How should I use this medication? This  medication is injected into a vein. It is given by your care team in a hospital or clinic setting. Talk to your care team about the use of this medication in children. While it may be given to children for selected conditions, precautions do apply. Overdosage: If you think you have taken too much of this medicine contact a poison control center or emergency room at once. NOTE: This medicine is only for you. Do not share this medicine with others. What if I miss a dose? Keep appointments for follow-up doses. It is important not to miss your dose. Call your care team if you are unable to keep an appointment. What may interact with this medication? Do not take this medication with any of the following: Live virus vaccines Other medications may affect the way this medication works. Talk with your care team about all of the medications you take. They may suggest changes to your treatment plan to lower the risk of side effects and to make sure your medications work as intended. This list may not describe all possible interactions. Give your health care provider a list of all the medicines, herbs, non-prescription drugs, or dietary supplements you use. Also tell them if you smoke, drink alcohol, or use illegal drugs. Some items may interact with your medicine. What should I watch for while using this medication? Your condition will be monitored carefully while you are receiving this medication. You may need blood work while taking this medication. This medication may make you feel generally unwell. This is not uncommon as chemotherapy can affect healthy cells as well as cancer cells. Report any side effects. Continue your course of treatment even though you feel ill unless your care team tells you to stop. This medication can cause serious allergic reactions. To reduce the risk, your care team may give you other medications to take before receiving this one. Be sure to follow the directions from your care  team. This medication may increase your risk of getting an infection. Call your care team for advice if you get a fever, chills, sore throat, or other symptoms of a cold or flu. Do not treat yourself. Try to avoid being around people who are sick. This medication may increase your risk to bruise or bleed. Call your care team if you notice any unusual bleeding. Be careful brushing or flossing your teeth or using a toothpick because you may get an infection or bleed more easily. If you have any dental work done, tell your dentist you are receiving this medication. Talk to your care team if you may be pregnant. Serious birth defects can occur if you take this medication during pregnancy. Talk to your care team before breastfeeding. Changes to your treatment plan may be needed. What side effects may I notice from receiving this medication? Side effects that you should report to your care team as soon as possible: Allergic reactions--skin rash, itching, hives, swelling of the face, lips, tongue, or throat Heart rhythm changes--fast or irregular heartbeat, dizziness, feeling faint or lightheaded, chest pain, trouble breathing Increase in blood pressure Infection--fever, chills, cough, sore throat, wounds that don't heal, pain or trouble when passing  urine, general feeling of discomfort or being unwell Low blood pressure--dizziness, feeling faint or lightheaded, blurry vision Low red blood cell level--unusual weakness or fatigue, dizziness, headache, trouble breathing Painful swelling, warmth, or redness of the skin, blisters or sores at the infusion site Pain, tingling, or numbness in the hands or feet Slow heartbeat--dizziness, feeling faint or lightheaded, confusion, trouble breathing, unusual weakness or fatigue Unusual bruising or bleeding Side effects that usually do not require medical attention (report to your care team if they continue or are bothersome): Diarrhea Hair loss Joint pain Loss of  appetite Muscle pain Nausea Vomiting This list may not describe all possible side effects. Call your doctor for medical advice about side effects. You may report side effects to FDA at 1-800-FDA-1088. Where should I keep my medication? This medication is given in a hospital or clinic. It will not be stored at home. NOTE: This sheet is a summary. It may not cover all possible information. If you have questions about this medicine, talk to your doctor, pharmacist, or health care provider.  2023 Elsevier/Gold Standard (2021-09-23 00:00:00) Carboplatin Injection What is this medication? CARBOPLATIN (KAR boe pla tin) treats some types of cancer. It works by slowing down the growth of cancer cells. This medicine may be used for other purposes; ask your health care provider or pharmacist if you have questions. COMMON BRAND NAME(S): Paraplatin What should I tell my care team before I take this medication? They need to know if you have any of these conditions: Blood disorders Hearing problems Kidney disease Recent or ongoing radiation therapy An unusual or allergic reaction to carboplatin, cisplatin, other medications, foods, dyes, or preservatives Pregnant or trying to get pregnant Breast-feeding How should I use this medication? This medication is injected into a vein. It is given by your care team in a hospital or clinic setting. Talk to your care team about the use of this medication in children. Special care may be needed. Overdosage: If you think you have taken too much of this medicine contact a poison control center or emergency room at once. NOTE: This medicine is only for you. Do not share this medicine with others. What if I miss a dose? Keep appointments for follow-up doses. It is important not to miss your dose. Call your care team if you are unable to keep an appointment. What may interact with this medication? Medications for seizures Some antibiotics, such as amikacin,  gentamicin, neomycin, streptomycin, tobramycin Vaccines This list may not describe all possible interactions. Give your health care provider a list of all the medicines, herbs, non-prescription drugs, or dietary supplements you use. Also tell them if you smoke, drink alcohol, or use illegal drugs. Some items may interact with your medicine. What should I watch for while using this medication? Your condition will be monitored carefully while you are receiving this medication. You may need blood work while taking this medication. This medication may make you feel generally unwell. This is not uncommon, as chemotherapy can affect healthy cells as well as cancer cells. Report any side effects. Continue your course of treatment even though you feel ill unless your care team tells you to stop. In some cases, you may be given additional medications to help with side effects. Follow all directions for their use. This medication may increase your risk of getting an infection. Call your care team for advice if you get a fever, chills, sore throat, or other symptoms of a cold or flu. Do not treat yourself. Try  to avoid being around people who are sick. Avoid taking medications that contain aspirin, acetaminophen, ibuprofen, naproxen, or ketoprofen unless instructed by your care team. These medications may hide a fever. Be careful brushing or flossing your teeth or using a toothpick because you may get an infection or bleed more easily. If you have any dental work done, tell your dentist you are receiving this medication. Talk to your care team if you wish to become pregnant or think you might be pregnant. This medication can cause serious birth defects. Talk to your care team about effective forms of contraception. Do not breast-feed while taking this medication. What side effects may I notice from receiving this medication? Side effects that you should report to your care team as soon as possible: Allergic  reactions--skin rash, itching, hives, swelling of the face, lips, tongue, or throat Infection--fever, chills, cough, sore throat, wounds that don't heal, pain or trouble when passing urine, general feeling of discomfort or being unwell Low red blood cell level--unusual weakness or fatigue, dizziness, headache, trouble breathing Pain, tingling, or numbness in the hands or feet, muscle weakness, change in vision, confusion or trouble speaking, loss of balance or coordination, trouble walking, seizures Unusual bruising or bleeding Side effects that usually do not require medical attention (report to your care team if they continue or are bothersome): Hair loss Nausea Unusual weakness or fatigue Vomiting This list may not describe all possible side effects. Call your doctor for medical advice about side effects. You may report side effects to FDA at 1-800-FDA-1088. Where should I keep my medication? This medication is given in a hospital or clinic. It will not be stored at home. NOTE: This sheet is a summary. It may not cover all possible information. If you have questions about this medicine, talk to your doctor, pharmacist, or health care provider.  2023 Elsevier/Gold Standard (2007-06-30 00:00:00)

## 2022-09-15 NOTE — Progress Notes (Signed)
Ok per Dr. [name] to treat today with Bilirubin of 1.9.

## 2022-09-15 NOTE — Progress Notes (Signed)
[facility] OFFICE PROGRESS NOTE  Patient Care Team: Geoffry Paradise, MD as PCP - General (Internal Medicine) Leanora Ivanoff, RN as Registered Nurse  ASSESSMENT & PLAN:  Left ovarian epithelial cancer [facility]) We discussed again the risk and benefits of carboplatin and paclitaxel We discussed expected side effects from treatment Recommend cryotherapy to reduce risk of peripheral neuropathy I recommend minimum 3 cycles of therapy before repeat imaging studies  Anxiety state She appears somewhat anxious today We gave her permission to take alprazolam as needed and refilled her prescription  No orders of the defined types were placed in this encounter.   All questions were answered. The patient knows to call the clinic with any problems, questions or concerns. The total time spent in the appointment was 20 minutes encounter with patients including review of chart and various tests results, discussions about plan of care and coordination of care plan   Artis Delay, MD 09/15/2022 9:05 AM  INTERVAL HISTORY: Please see below for problem oriented charting. she returns for treatment follow-up and to be seen prior to cycle 1 of carboplatin and paclitaxel She appears somewhat anxious We discussed side effects to be expected and I addressed all her other questions  REVIEW OF SYSTEMS:   Constitutional: Denies fevers, chills or abnormal weight loss Eyes: Denies blurriness of vision Ears, nose, mouth, throat, and face: Denies mucositis or sore throat Respiratory: Denies cough, dyspnea or wheezes Cardiovascular: Denies palpitation, chest discomfort or lower extremity swelling Gastrointestinal:  Denies nausea, heartburn or change in bowel habits Skin: Denies abnormal skin rashes Lymphatics: Denies new lymphadenopathy or easy bruising Neurological:Denies numbness, tingling or new weaknesses Behavioral/Psych: Mood is stable, no new changes  All other systems were reviewed with  the patient and are negative.  I have reviewed the past medical history, past surgical history, social history and family history with the patient and they are unchanged from previous note.  ALLERGIES:  is allergic to skin adhesives [cyanoacrylate], shrimp [shellfish allergy], and sulfonamide derivatives.  MEDICATIONS:  Current Outpatient Medications  Medication Sig Dispense Refill   ALPRAZolam (XANAX) 0.25 MG tablet Take 1 tablet by mouth at bedtime as needed for anxiety. 30 tablet 0   carboxymethylcellulose (REFRESH PLUS) 0.5 % SOLN Place 1 drop into both eyes 2 (two) times daily as needed (dry eyes).     Cholecalciferol (VITAMIN D3) 25 MCG (1000 UT) CAPS Take 1,000 Units by mouth daily.      dexamethasone (DECADRON) 4 MG tablet Take 2 tabs at the night before and 2 tab the morning of chemotherapy, every 3 weeks, by mouth x 6 cycles 24 tablet 6   esomeprazole (NEXIUM) 20 MG capsule Take 20 mg by mouth daily.      estradiol (ESTRACE VAGINAL) 0.1 MG/GM vaginal cream Place 1 applicatorful vaginally 3 (three) times a week. 42.5 g 12   furosemide (LASIX) 20 MG tablet Take 1 tablet (20 mg total) by mouth daily. 60 tablet 1   lidocaine-prilocaine (EMLA) cream Apply topically as needed. 30 g 0   lisinopril (ZESTRIL) 20 MG tablet Take 1 tablet (20 mg total) by mouth daily. 90 tablet 1   loratadine (CLARITIN) 10 MG tablet Take 10 mg by mouth daily as needed for allergies.     Multiple Vitamin (MULTIVITAMIN WITH MINERALS) TABS tablet Take 1 tablet by mouth daily.     ondansetron (ZOFRAN) 8 MG tablet Take 1 tablet by mouth 2 times daily as needed. Start on the third day after chemotherapy. 30 tablet  1   prochlorperazine (COMPAZINE) 10 MG tablet Take 1 tablet by mouth every 6 hours as needed (Nausea or vomiting). 30 tablet 1   No current facility-administered medications for this visit.    SUMMARY OF ONCOLOGIC HISTORY: Oncology History Overview Note  Her 2 negative, MSI stable, serous, ER  40% Negative genetics Progressed on niraparib and bevacizumab Folate receptor alpha neg PD-L1 CPS 1%   Left ovarian epithelial cancer  11/09/2018 Imaging   1. 14.7 cm poorly defined soft tissue mass in central pelvis suspicious for primary ovarian carcinoma, with uterine leiomyosarcoma considered a less likely differential diagnosis. 2. Moderate ascites and diffuse peritoneal carcinomatosis. 3. Several small uterine fibroids.   11/13/2018 Tumor Marker   Patient's tumor was tested for the following markers: CA-125 Results of the tumor marker test revealed 1434   11/21/2018 Procedure   Successful ultrasound-guided diagnostic and therapeutic paracentesis yielding 3.1 liters of peritoneal fluid   11/21/2018 Pathology Results   PERITONEAL/ASCITIC FLUID(SPECIMEN 1 OF 1 COLLECTED 11/21/18): ADENOCARCINOMA. Specimen Clinical Information Pelvic mass suspicious for ovarian cancer Source Peritoneal/Ascitic Fluid, (specimen 1 of 1 collected 11/21/18) Gross Specimen: Received is/are 1000 ccs of dark amber fluid. (CM:cm) Prepared: # Smears: 0 # Concentration Technique Slides (i.e. 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   11/29/2018 Initial Diagnosis   Ovarian cancer (HCC)   11/29/2018 Cancer Staging   Staging form: Ovary, Fallopian Tube, and Primary Peritoneal Carcinoma, AJCC 8th Edition - Clinical: cT3, cN0, cM0 - Signed by Artis Delay, MD on 11/29/2018   12/03/2018 Procedure   Successful ultrasound-guided therapeutic paracentesis yielding 3.7 liters of peritoneal fluid.     12/06/2018 Procedure   Placement of a subcutaneous port device. Catheter tip at the SVC and right atrium junction   12/14/2018 Procedure   Successful ultrasound-guided paracentesis yielding 2.9 L of peritoneal fluid   12/28/2018 Tumor Marker   Patient's tumor was tested for the following markers: CA-125 Results of the tumor  marker test revealed 812.   12/28/2018 - 04/22/2019 Chemotherapy   The patient had carboplatin and taxol for neoadjuvant treatment, followed by interval debulking surgery and subsequent adjuvant chemotherapy treatment.     02/01/2019 Imaging   Ct abdomen and pelvis 8.6 cm left ovarian mass, corresponding to the patient's known primary neoplasm, improved.   Mild peritoneal nodularity/omental caking, improved.   Small abdominopelvic ascites, improved.     02/01/2019 Tumor Marker   Patient's tumor was tested for the following markers: CA-125 Results of the tumor marker test revealed 183.   02/12/2019 Surgery   Preoperative Diagnosis: Stage IIIC ovarian cancer, s/p neoadjuvant chemotherapy      Procedure(s) Performed: 1. Exploratory laparotomy with total abdominal hysterectomy, bilateral salpingo-oophorectomy, omentectomy radical tumor debulking for ovarian cancer.   Surgeon: Luisa Dago, MD.    Operative Findings: upper abdomen free of disease. No visible omental disease. Small volume (200cc) ascites. 8cm friable mass replacing left ovary and adherent to the sigmoid colon mesentery and ureter on the left.  Anterior fibroid. This represented an optimal cytoreduction (R0) with no gross visible disease remaining.    02/12/2019 Pathology Results   A. OVARY AND FALLOPIAN TUBE, LEFT, SALPINGO-OOPHORECTOMY: - Serous carcinoma, high grade, status post neoadjuvant therapy - See oncology table and comment below B. UTERUS CERVIX WITH RIGHT FALLOPIAN TUBE AND OVARY, HYSTERECTOMY: Uterus: - Serosal surface involved by serous carcinoma - Endomyometrium uninvolved by carcinoma - Benign endometrial polyp (4.1 cm) -  Leiomyomata (5.5 cm; largest) - Adenomyosis Cervix: - Uninvolved by carcinoma Left ovary: - Serous carcinoma, high grade Left fallopian tube: - Serous carcinoma, high grade C. SOFT TISSUE, LEFT PELVIC SIDEWALL TUMOR, EXCISION: - Metastatic serous carcinoma, high-grade D. SOFT  TISSUE, SIGMOID COLON MESENTERY, EXCISION: - Metastatic serous carcinoma, high-grade E. OMENTUM, TUMOR RESECTION: - Metastatic serous carcinoma, high-grade OVARY or FALLOPIAN TUBE or PRIMARY PERITONEUM: Procedure: Total hysterectomy and bilateral salpingo-oophorectomy, omentectomy and peritoneal biopsies Specimen Integrity: Fragmented Tumor Site: Left ovary and fallopian tube Ovarian Surface Involvement: Present Fallopian Tube Surface Involvement: Present Tumor Size: 6.3 cm (see comment) Histologic Type: Serous carcinoma Histologic Grade: High-grade Implants: Not applicable Other Tissue/ Organ Involvement: Right ovary, right fallopian tube, omentum, mesentery Largest Extrapelvic Peritoneal Focus: Macroscopic Peritoneal/Ascitic Fluid: Malignant Treatment Effect: No definite or minimal response identified (chemotherapy response score 1 [CRS1] Regional Lymph Nodes: No lymph nodes submitted or found Pathologic Stage Classification (pTNM, AJCC 8th Edition): pT3c, pNX Representative Tumor Block: A4 Comment(s): Additional testing (HER-2, MMR and MSI) are pending. The primary tumor site appears to be the left ovary and fallopian tube. The uterus is only involved on the serosal surface. There is tumor on the anterior peritoneal reflection.  Addendum: Tumor is Her2 negative,MSI stable   02/19/2019 Tumor Marker   Patient's tumor was tested for the following markers: CA-125 Results of the tumor marker test revealed 40.3   04/01/2019 Tumor Marker   Patient's tumor was tested for the following markers: CA-125 Results of the tumor marker test revealed 8.1    Genetic Testing   Negative testing. No pathogenic variants identified on the [employer]. The report date is 04/17/2019.  Somatic genes analyzed through [facility]: ATM, BARD1, BRCA1, BRCA2, BRIP1, CHEK2, MRE11A, NBN, PALB2, RAD51C, RAD51D.  The [facility] gene panel offered by [employer] includes sequencing  and rearrangement analysis for the following 36 genes: APC*, ATM*, AXIN2, BARD1, BMPR1A, BRCA1*, BRCA2*, BRIP1*, CDH1*, CDK4, CDKN2A, CHEK2*, DICER1, MLH1*, MSH2*, MSH3, MSH6*, MUTYH*, NBN, NF1*, NTHL1, PALB2*, PMS2*, PTEN*, RAD51C*, RAD51D*, RECQL, SMAD4, SMARCA4, STK11 and TP53* (sequencing and deletion/duplication); HOXB13, POLD1 and POLE (sequencing only); EPCAM and GREM1 (deletion/duplication only).    05/21/2019 Imaging   1. Interval hysterectomy, oophorectomy and omentectomy. No evidence of residual ovarian carcinoma in the pelvis. No evidence of peritoneal disease. No intraperitoneal free fluid. 2. Nonobstructing LEFT renal calculi.  Normal ureters.   05/22/2019 Tumor Marker   Patient's tumor was tested for the following markers: CA-125 Results of the tumor marker test revealed 5.7.   06/03/2019 Procedure   Successful right IJ vein Port-A-Cath explant.   08/26/2019 Tumor Marker   Patient's tumor was tested for the following markers: CA-125. Results of the tumor marker test revealed 4.9   10/03/2019 Imaging   1. New tumor deposits along the capsular surfaces of the liver, spleen, in the left pelvis, and right paracolic gutter, compatible with metastatic disease. 2. Mild dilation of the dorsal pancreatic duct in the pancreatic body and head, cause uncertain. 3. Nonobstructive left nephrolithiasis. 4. Aortic atherosclerosis.   Aortic Atherosclerosis (ICD10-I70.0   10/10/2019 Procedure   Successful placement of a right IJ approach Power Port with ultrasound and fluoroscopic guidance. The catheter is ready for use.     10/11/2019 Tumor Marker   Patient's tumor was tested for the following markers: CA-125 Results of the tumor marker test revealed 19.6.   10/14/2019 -  Chemotherapy   The patient had carboplatin and gemzar for chemotherapy treatment.     11/04/2019 Tumor Marker  Patient's tumor was tested for the following markers: CA-125 Results of the tumor marker test revealed  11.4   11/28/2019 Tumor Marker   Patient's tumor was tested for the following markers: CA-125 Results of the tumor marker test revealed 5.8   12/20/2019 Imaging   1. Significant interval reduction in mixed solid and cystic nodule in the vicinity of the left ovary, with significant improvement or resolution of multiple pelvic, peritoneal, and organ capsule implants. Resolution of previously seen small volume ascites. Findings are consistent with treatment response of abdominal metastatic disease. 2. Status post hysterectomy, oophorectomy, and omentectomy. 3. Nonobstructive left nephrolithiasis. 4. Aortic Atherosclerosis (ICD10-I70.0).   12/23/2019 Tumor Marker   Patient's tumor was tested for the following markers: CA-125 Results of the tumor marker test revealed 5.0   01/24/2020 Tumor Marker   Patient's tumor was tested for the following markers: CA-125 Results of the tumor marker test revealed 4.8   02/24/2020 Tumor Marker   Patient's tumor was tested for the following markers: CA-125. Results of the tumor marker test revealed 4.3.   03/06/2020 Imaging   1. Status post hysterectomy, bilateral oophorectomy, and omentectomy. 2. Resolution of previously described left adnexal nodularity. 3. No residual disease identified. 4.  Aortic Atherosclerosis (ICD10-I70.0). 5. Left nephrolithiasis.   04/20/2020 Tumor Marker   Patient's tumor was tested for the following markers: CA-125 Results of the tumor marker test revealed 4.6   05/18/2020 Tumor Marker   Patient's tumor was tested for the following markers: CA-125 Results of the tumor marker test revealed 4.7   05/29/2020 Imaging   Status post hysterectomy and bilateral salpingo oophorectomy.   No evidence of recurrent or metastatic disease.   06/08/2020 - 09/13/2021 Chemotherapy   She was on niraparib       06/17/2020 Tumor Marker   Patient's tumor was tested for the following markers: CA-125 Results of the tumor marker test revealed  4.5   08/04/2020 Tumor Marker   Patient's tumor was tested for the following markers: CA-125. Results of the tumor marker test revealed 5.7   09/07/2020 Imaging   1. Status post hysterectomy and bilateral salpingo oophorectomy. No evidence of recurrent or metastatic disease. 2. Nonobstructing left nephrolithiasis. 3. Left-sided colonic diverticulosis without findings of acute diverticulitis. 4. Aortic atherosclerosis.     09/07/2020 Tumor Marker   Patient's tumor was tested for the following markers: CA-125 Results of the tumor marker test revealed 4.8.   10/21/2020 Tumor Marker   Patient's tumor was tested for the following markers: CA-125. Results of the tumor marker test revealed 5.1.   12/14/2020 Tumor Marker   Patient's tumor was tested for the following markers: CA-125. Results of the tumor marker test revealed 5.5.   04/05/2021 Tumor Marker   Patient's tumor was tested for the following markers: CA-125. Results of the tumor marker test revealed 5.9.   05/31/2021 Tumor Marker   Patient's tumor was tested for the following markers: CA-125. Results of the tumor marker test revealed 8.1.   09/13/2021 Imaging   1. Interval development of multiple new cystic and solid lesions in the abdomen and pelvis measuring up to 10.3 x 7.2 cm and consistent with peritoneal metastatic disease. 2. Nonobstructing left renal stones. 3. Aortic Atherosclerosis (ICD10-I70.0).     09/14/2021 Tumor Marker   Patient's tumor was tested for the following markers: CA-125. Results of the tumor marker test revealed 28.5.   09/16/2021 Echocardiogram   1. Left ventricular ejection fraction, by estimation, is 60 to 65%. The left ventricle  has normal function. The left ventricle has no regional wall motion abnormalities. Left ventricular diastolic parameters are consistent with Grade I diastolic dysfunction (impaired relaxation).  2. Right ventricular systolic function is normal. The right ventricular size is  normal.  3. The mitral valve is normal in structure. Trivial mitral valve regurgitation. No evidence of mitral stenosis.  4. The aortic valve has an indeterminant number of cusps. Aortic valve regurgitation is trivial. No aortic stenosis is present.  5. The inferior vena cava is normal in size with greater than 50% respiratory variability, suggesting right atrial pressure of 3 mmHg.     09/20/2021 - 01/17/2022 Chemotherapy   Patient is on Treatment Plan : OVARIAN RECURRENT Liposomal Doxorubicin + Carboplatin q28d X 6 Cycles     09/20/2021 - 02/22/2022 Chemotherapy   Patient is on Treatment Plan : OVARIAN RECURRENT Liposomal Doxorubicin + Carboplatin q28d X 6 Cycles     09/23/2021 Tumor Marker   Patient's tumor was tested for the following markers: CA-125. Results of the tumor marker test revealed 27.7.   11/24/2021 Tumor Marker   Patient's tumor was tested for the following markers: CA-125. Results of the tumor marker test revealed 28.4.   12/13/2021 Imaging   1. Overall stable peritoneal carcinomatosis. No new disease is demonstrated. 2. Moderate left-sided hydroureteronephrosis likely due to compression of the left ureter by the left pelvic mass. 3. Stable left-sided renal calculi. 4. Numerous left renal calculi.     12/22/2021 Tumor Marker   Patient's tumor was tested for the following markers: CA-125. Results of the tumor marker test revealed 24.7.   01/18/2022 Tumor Marker   Patient's tumor was tested for the following markers: CA-125. Results of the tumor marker test revealed 9.9.   01/27/2022 Echocardiogram    1. Left ventricular ejection fraction, by estimation, is 60 to 65%. The left ventricle has normal function. The left ventricle has no regional wall motion abnormalities. Left ventricular diastolic parameters are consistent with Grade I diastolic dysfunction (impaired relaxation). The average left ventricular global longitudinal strain is -21.1 %. The global longitudinal strain is  normal.  2. Right ventricular systolic function is normal. The right ventricular size is normal.  3. The mitral valve is abnormal. Mild mitral valve regurgitation.  4. The aortic valve is tricuspid. Aortic valve regurgitation is trivial.  5. The inferior vena cava is normal in size with greater than 50% respiratory variability, suggesting right atrial pressure of 3 mmHg.   02/23/2022 Tumor Marker   Patient's tumor was tested for the following markers: CA-125. Results of the tumor marker test revealed 8.5.   03/18/2022 Imaging   1. Diminished size of nodular and mixed solid and cystic peritoneal implants in the hepatorenal fossa, right lower quadrant, and left pelvis. 2. Unchanged solid mass in the right pelvis measuring 4.0 x 3.7 cm. 3. Constellation of findings is overall consistent with treatment response 4. Status post hysterectomy, oophorectomy, and omentectomy. 5. Nonobstructive left nephrolithiasis.   Aortic Atherosclerosis (ICD10-I70.0).     03/22/2022 - 08/16/2022 Chemotherapy   Patient is on Treatment Plan : Ovarian Bevacizumab q21d     03/24/2022 Tumor Marker   Patient's tumor was tested for the following markers: CA-125. Results of the tumor marker test revealed 7.1.   04/28/2022 Tumor Marker   Patient's tumor was tested for the following markers: CA-125. Results of the tumor marker test revealed 7.0.   05/27/2022 Tumor Marker   Patient's tumor was tested for the following markers: CA-125. Results of the tumor  marker test revealed 7.7.   06/14/2022 Imaging   1. Slight response to therapy as evidenced by decrease in size of a cystic and solid mass in the left adnexa as well as peritoneal implants in the right paracolic gutter. Solid right adnexal mass and additional peritoneal implants along right hepatic lobe and in the left anatomic pelvis appear stable. 2. Hepatic steatosis. 3. Left renal stones. 4.  Aortic atherosclerosis (ICD10-I70.0).   06/17/2022 Tumor Marker    Patient's tumor was tested for the following markers: CA-125. Results of the tumor marker test revealed 11.2.   09/05/2022 Imaging   CT ABDOMEN PELVIS W CONTRAST  Result Date: 09/06/2022 CLINICAL DATA:  Ovarian cancer.  Restaging.  * Tracking Code: BO * EXAM: CT ABDOMEN AND PELVIS WITH CONTRAST TECHNIQUE: Multidetector CT imaging of the abdomen and pelvis was performed using the standard protocol following bolus administration of intravenous contrast. RADIATION DOSE REDUCTION: This exam was performed according to the departmental dose-optimization program which includes automated exposure control, adjustment of the mA and/or kV according to patient size and/or use of iterative reconstruction technique. CONTRAST:  OMNIPAQUE IOHEXOL 300 MG/ML  SOLN COMPARISON:  06/13/2022 FINDINGS: Lower chest: Unremarkable. Hepatobiliary: No suspicious focal abnormality within the liver parenchyma. There is no evidence for gallstones, gallbladder wall thickening, or pericholecystic fluid. No intrahepatic or extrahepatic biliary dilation. Pancreas: No pancreatic mass lesion evident. Diffuse prominence of the main pancreatic duct is stable measuring up to 3-4 mm diameter in the head of the pancreas. Spleen: No splenomegaly. No focal mass lesion. Adrenals/Urinary Tract: No adrenal nodule or mass. Right kidney unremarkable. A cluster of nonobstructing stones in the upper pole right kidney is again noted with dominant stone measuring about 6 mm in size. No evidence for hydroureter. The urinary bladder appears normal for the degree of distention. Stomach/Bowel: Tiny hiatal hernia. Stomach otherwise unremarkable. Duodenum is normally positioned as is the ligament of Treitz. No small bowel wall thickening. No small bowel dilatation. The terminal ileum is normal. The appendix is normal. No gross colonic mass. No colonic wall thickening. Diverticular changes are noted in the left colon without evidence of diverticulitis.  Vascular/Lymphatic: There is mild atherosclerotic calcification of the abdominal aorta without aneurysm. There is no gastrohepatic or hepatoduodenal ligament lymphadenopathy. No retroperitoneal or mesenteric lymphadenopathy. No pelvic sidewall lymphadenopathy. Reproductive: Right-sided adnexal mass measured previously at 4.2 x 3.8 cm is now 4.9 x 4.6 cm. Cystic and solid left adnexal lesion measured previously at 4.1 x 3.4 cm is now 3.2 x 2.9 cm. 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 The more anterior of the 2 index lesions measures 2.6 x 2.1 cm today compared to 2.4 x 1.6 cm previously. The more posterior lesion is 2.4 x 1.4 cm today compared to 2.0 x 1.9 cm previously. 5 mm soft tissue nodule identified in the inferior paracolic gutter previously at 5 mm measures 10 mm today on image 51/2. Implants along the left posterior peritoneum adjacent to the psoas muscle and iliac vessels appear progressive. 1.6 x 1.3 cm lesion visible on 58/2 was 1.1 x 0.8 cm previously (remeasured). No evidence for ascites. Musculoskeletal: No worrisome lytic or sclerotic osseous abnormality. IMPRESSION: 1. Overall generalized appearance of mild disease progression. 2. Index right-sided adnexal mass has progressed since the prior with interval decrease in size of the cystic and solid left adnexal lesion. 3. Interval progression of index capsular implants along the medial right liver. 4. Interval progression of soft tissue nodule in the inferior  right paracolic gutter and peritoneal implants in the lower left abdomen and pelvis. 5. No evidence for ascites. 6. Nonobstructing left renal stones. 7. Tiny hiatal hernia. 8. Left colonic diverticulosis without diverticulitis. 9.  Aortic Atherosclerosis (ICD10-I70.0). Electronically Signed   By: Kennith Center M.D.   On: 09/06/2022 07:57      09/15/2022 -  Chemotherapy   Patient is on Treatment Plan : OVARIAN Carboplatin (AUC 6) + Paclitaxel (175) q21d X 6 Cycles        PHYSICAL EXAMINATION: ECOG PERFORMANCE STATUS: 0 - Asymptomatic  Vitals:   09/15/22 0855  BP: (!) 147/87  Pulse: 88  Resp: 18  Temp: 97.7 F (36.5 C)  SpO2: 100%   Filed Weights   09/15/22 0855  Weight: 144 lb (65.3 kg)    GENERAL:alert, no distress and comfortable NEURO: alert & oriented x 3 with fluent speech, no focal motor/sensory deficits  LABORATORY DATA:  I have reviewed the data as listed    Component Value Date/Time   NA 137 08/16/2022 0734   K 3.7 08/16/2022 0734   CL 104 08/16/2022 0734   CO2 26 08/16/2022 0734   GLUCOSE 99 08/16/2022 0734   BUN 15 08/16/2022 0734   CREATININE 0.60 08/16/2022 0734   CREATININE 0.54 05/26/2022 0916   CALCIUM 8.6 (L) 08/16/2022 0734   PROT 6.3 (L) 08/16/2022 0734   ALBUMIN 4.1 08/16/2022 0734   AST 42 (H) 08/16/2022 0734   AST 48 (H) 05/26/2022 0916   ALT 43 08/16/2022 0734   ALT 39 05/26/2022 0916   ALKPHOS 83 08/16/2022 0734   BILITOT 1.6 (H) 08/16/2022 0734   BILITOT 1.5 (H) 05/26/2022 0916   GFRNONAA >60 08/16/2022 0734   GFRNONAA >60 05/26/2022 0916   GFRAA >60 02/24/2020 0852    No results found for: "SPEP", "UPEP"  Lab Results  Component Value Date   WBC 5.3 09/15/2022   NEUTROABS 4.8 09/15/2022   HGB 13.9 09/15/2022   HCT 39.9 09/15/2022   MCV 95.0 09/15/2022   PLT 165 09/15/2022      Chemistry      Component Value Date/Time   NA 137 08/16/2022 0734   K 3.7 08/16/2022 0734   CL 104 08/16/2022 0734   CO2 26 08/16/2022 0734   BUN 15 08/16/2022 0734   CREATININE 0.60 08/16/2022 0734   CREATININE 0.54 05/26/2022 0916      Component Value Date/Time   CALCIUM 8.6 (L) 08/16/2022 0734   ALKPHOS 83 08/16/2022 0734   AST 42 (H) 08/16/2022 0734   AST 48 (H) 05/26/2022 0916   ALT 43 08/16/2022 0734   ALT 39 05/26/2022 0916   BILITOT 1.6 (H) 08/16/2022 0734   BILITOT 1.5 (H) 05/26/2022 0916       RADIOGRAPHIC STUDIES: I have personally reviewed the radiological images as listed and agreed  with the findings in the report. CT ABDOMEN PELVIS W CONTRAST  Result Date: 09/06/2022 CLINICAL DATA:  Ovarian cancer.  Restaging.  * Tracking Code: BO * EXAM: CT ABDOMEN AND PELVIS WITH CONTRAST TECHNIQUE: Multidetector CT imaging of the abdomen and pelvis was performed using the standard protocol following bolus administration of intravenous contrast. RADIATION DOSE REDUCTION: This exam was performed according to the departmental dose-optimization program which includes automated exposure control, adjustment of the mA and/or kV according to patient size and/or use of iterative reconstruction technique. CONTRAST:  OMNIPAQUE IOHEXOL 300 MG/ML  SOLN COMPARISON:  06/13/2022 FINDINGS: Lower chest: Unremarkable. Hepatobiliary: No suspicious focal abnormality within the  liver parenchyma. There is no evidence for gallstones, gallbladder wall thickening, or pericholecystic fluid. No intrahepatic or extrahepatic biliary dilation. Pancreas: No pancreatic mass lesion evident. Diffuse prominence of the main pancreatic duct is stable measuring up to 3-4 mm diameter in the head of the pancreas. Spleen: No splenomegaly. No focal mass lesion. Adrenals/Urinary Tract: No adrenal nodule or mass. Right kidney unremarkable. A cluster of nonobstructing stones in the upper pole right kidney is again noted with dominant stone measuring about 6 mm in size. No evidence for hydroureter. The urinary bladder appears normal for the degree of distention. Stomach/Bowel: Tiny hiatal hernia. Stomach otherwise unremarkable. Duodenum is normally positioned as is the ligament of Treitz. No small bowel wall thickening. No small bowel dilatation. The terminal ileum is normal. The appendix is normal. No gross colonic mass. No colonic wall thickening. Diverticular changes are noted in the left colon without evidence of diverticulitis. Vascular/Lymphatic: There is mild atherosclerotic calcification of the abdominal aorta without aneurysm. There is  no gastrohepatic or hepatoduodenal ligament lymphadenopathy. No retroperitoneal or mesenteric lymphadenopathy. No pelvic sidewall lymphadenopathy. Reproductive: Right-sided adnexal mass measured previously at 4.2 x 3.8 cm is now 4.9 x 4.6 cm. Cystic and solid left adnexal lesion measured previously at 4.1 x 3.4 cm is now 3.2 x 2.9 cm. 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 The more anterior of the 2 index lesions measures 2.6 x 2.1 cm today compared to 2.4 x 1.6 cm previously. The more posterior lesion is 2.4 x 1.4 cm today compared to 2.0 x 1.9 cm previously. 5 mm soft tissue nodule identified in the inferior paracolic gutter previously at 5 mm measures 10 mm today on image 51/2. Implants along the left posterior peritoneum adjacent to the psoas muscle and iliac vessels appear progressive. 1.6 x 1.3 cm lesion visible on 58/2 was 1.1 x 0.8 cm previously (remeasured). No evidence for ascites. Musculoskeletal: No worrisome lytic or sclerotic osseous abnormality. IMPRESSION: 1. Overall generalized appearance of mild disease progression. 2. Index right-sided adnexal mass has progressed since the prior with interval decrease in size of the cystic and solid left adnexal lesion. 3. Interval progression of index capsular implants along the medial right liver. 4. Interval progression of soft tissue nodule in the inferior right paracolic gutter and peritoneal implants in the lower left abdomen and pelvis. 5. No evidence for ascites. 6. Nonobstructing left renal stones. 7. Tiny hiatal hernia. 8. Left colonic diverticulosis without diverticulitis. 9.  Aortic Atherosclerosis (ICD10-I70.0). Electronically Signed   By: Kennith Center M.D.   On: 09/06/2022 07:57

## 2022-09-15 NOTE — Assessment & Plan Note (Signed)
She appears somewhat anxious today We gave her permission to take alprazolam as needed and refilled her prescription

## 2022-09-21 ENCOUNTER — Telehealth: Payer: Self-pay

## 2022-09-21 NOTE — Telephone Encounter (Signed)
Pt called and states she has not had a BM since Saturday. She reports "sometimes" taking senna, and eating raisin bran. Advised pt to take Miralax BID and Senakot TID until she has a BM, then go back down to miralax daily. She verbalized understanding and knows to call with any further concerns.

## 2022-09-22 NOTE — Telephone Encounter (Signed)
Hi Clydie Braun,  Please call/follow today

## 2022-09-22 NOTE — Telephone Encounter (Signed)
[facility] and she had 2 bowel movements after taking Miralax and Senokot yesterday.  She is feeling much better. Advised her to keep taking the Miralax once a day and Senokot as needed.

## 2022-09-26 ENCOUNTER — Encounter: Payer: Self-pay | Admitting: Hematology and Oncology

## 2022-09-27 ENCOUNTER — Encounter: Payer: Self-pay | Admitting: Internal Medicine

## 2022-10-05 MED FILL — Fosaprepitant Dimeglumine For IV Infusion 150 MG (Base Eq): INTRAVENOUS | Qty: 5 | Status: AC

## 2022-10-05 MED FILL — Dexamethasone Sodium Phosphate Inj 100 MG/10ML: INTRAMUSCULAR | Qty: 1 | Status: AC

## 2022-10-06 ENCOUNTER — Encounter: Payer: Self-pay | Admitting: Hematology and Oncology

## 2022-10-06 ENCOUNTER — Inpatient Hospital Stay: Payer: Medicare Other | Admitting: Licensed Clinical Social Worker

## 2022-10-06 ENCOUNTER — Telehealth: Payer: Self-pay | Admitting: Oncology

## 2022-10-06 ENCOUNTER — Inpatient Hospital Stay: Payer: Medicare Other | Attending: Gynecologic Oncology

## 2022-10-06 ENCOUNTER — Inpatient Hospital Stay: Payer: Medicare Other

## 2022-10-06 ENCOUNTER — Inpatient Hospital Stay (HOSPITAL_BASED_OUTPATIENT_CLINIC_OR_DEPARTMENT_OTHER): Payer: Medicare Other | Admitting: Hematology and Oncology

## 2022-10-06 VITALS — BP 154/83 | HR 81 | Temp 98.5°F

## 2022-10-06 VITALS — BP 162/92 | HR 106 | Resp 18 | Ht 62.0 in | Wt 141.0 lb

## 2022-10-06 DIAGNOSIS — C7989 Secondary malignant neoplasm of other specified sites: Secondary | ICD-10-CM | POA: Insufficient documentation

## 2022-10-06 DIAGNOSIS — Z5111 Encounter for antineoplastic chemotherapy: Secondary | ICD-10-CM | POA: Diagnosis present

## 2022-10-06 DIAGNOSIS — T451X5A Adverse effect of antineoplastic and immunosuppressive drugs, initial encounter: Secondary | ICD-10-CM

## 2022-10-06 DIAGNOSIS — C786 Secondary malignant neoplasm of retroperitoneum and peritoneum: Secondary | ICD-10-CM | POA: Diagnosis not present

## 2022-10-06 DIAGNOSIS — C562 Malignant neoplasm of left ovary: Secondary | ICD-10-CM

## 2022-10-06 DIAGNOSIS — Z7189 Other specified counseling: Secondary | ICD-10-CM

## 2022-10-06 DIAGNOSIS — G620 Drug-induced polyneuropathy: Secondary | ICD-10-CM | POA: Diagnosis not present

## 2022-10-06 DIAGNOSIS — C563 Malignant neoplasm of bilateral ovaries: Secondary | ICD-10-CM | POA: Diagnosis not present

## 2022-10-06 LAB — CBC WITH DIFFERENTIAL (CANCER CENTER ONLY)
Abs Immature Granulocytes: 0.02 10*3/uL (ref 0.00–0.07)
Basophils Absolute: 0 10*3/uL (ref 0.0–0.1)
Basophils Relative: 0 %
Eosinophils Absolute: 0 10*3/uL (ref 0.0–0.5)
Eosinophils Relative: 0 %
HCT: 38.3 % (ref 36.0–46.0)
Hemoglobin: 13.4 g/dL (ref 12.0–15.0)
Immature Granulocytes: 0 %
Lymphocytes Relative: 7 %
Lymphs Abs: 0.5 10*3/uL — ABNORMAL LOW (ref 0.7–4.0)
MCH: 32.6 pg (ref 26.0–34.0)
MCHC: 35 g/dL (ref 30.0–36.0)
MCV: 93.2 fL (ref 80.0–100.0)
Monocytes Absolute: 0.1 10*3/uL (ref 0.1–1.0)
Monocytes Relative: 1 %
Neutro Abs: 6.5 10*3/uL (ref 1.7–7.7)
Neutrophils Relative %: 92 %
Platelet Count: 161 10*3/uL (ref 150–400)
RBC: 4.11 MIL/uL (ref 3.87–5.11)
RDW: 13 % (ref 11.5–15.5)
WBC Count: 7.1 10*3/uL (ref 4.0–10.5)
nRBC: 0 % (ref 0.0–0.2)

## 2022-10-06 LAB — CMP (CANCER CENTER ONLY)
ALT: 50 U/L — ABNORMAL HIGH (ref 0–44)
AST: 53 U/L — ABNORMAL HIGH (ref 15–41)
Albumin: 4.3 g/dL (ref 3.5–5.0)
Alkaline Phosphatase: 83 U/L (ref 38–126)
Anion gap: 8 (ref 5–15)
BUN: 14 mg/dL (ref 8–23)
CO2: 26 mmol/L (ref 22–32)
Calcium: 9.5 mg/dL (ref 8.9–10.3)
Chloride: 105 mmol/L (ref 98–111)
Creatinine: 0.62 mg/dL (ref 0.44–1.00)
GFR, Estimated: >60 mL/min (ref >60)
Glucose, Bld: 223 mg/dL — ABNORMAL HIGH (ref 70–99)
Potassium: 3.9 mmol/L (ref 3.5–5.1)
Sodium: 139 mmol/L (ref 135–145)
Total Bilirubin: 0.7 mg/dL (ref 0.3–1.2)
Total Protein: 6.9 g/dL (ref 6.5–8.1)

## 2022-10-06 MED ORDER — SODIUM CHLORIDE 0.9% FLUSH: 10.0000 mL | Freq: Once | INTRAVENOUS | Status: DC

## 2022-10-06 MED ORDER — FAMOTIDINE 20 MG IN NS 100 ML IVPB
20.0000 mg | Freq: Once | INTRAVENOUS | Status: AC
  Administered 2022-10-06: 20 mg via INTRAVENOUS
  Filled 2022-10-06: qty 100

## 2022-10-06 MED ORDER — PALONOSETRON HCL INJECTION 0.25 MG/5ML
0.2500 mg | Freq: Once | INTRAVENOUS | Status: AC
  Administered 2022-10-06: 0.25 mg via INTRAVENOUS
  Filled 2022-10-06: qty 5

## 2022-10-06 MED ORDER — CETIRIZINE HCL 10 MG/ML IV SOLN
10.0000 mg | Freq: Once | INTRAVENOUS | Status: AC
  Administered 2022-10-06: 10 mg via INTRAVENOUS
  Filled 2022-10-06: qty 1

## 2022-10-06 MED ORDER — SODIUM CHLORIDE 0.9 % IV SOLN
483.0000 mg | Freq: Once | INTRAVENOUS | Status: AC
  Administered 2022-10-06: 480 mg via INTRAVENOUS
  Filled 2022-10-06: qty 48

## 2022-10-06 MED ORDER — SODIUM CHLORIDE 0.9 % IV SOLN
10.0000 mg | Freq: Once | INTRAVENOUS | Status: AC
  Administered 2022-10-06: 10 mg via INTRAVENOUS
  Filled 2022-10-06: qty 10

## 2022-10-06 MED ORDER — SODIUM CHLORIDE 0.9% FLUSH
10.0000 mL | INTRAVENOUS | Status: DC | PRN
  Administered 2022-10-06: 10 mL

## 2022-10-06 MED ORDER — SODIUM CHLORIDE 0.9% FLUSH
10.0000 mL | Freq: Once | INTRAVENOUS | Status: AC
  Administered 2022-10-06: 10 mL

## 2022-10-06 MED ORDER — SODIUM CHLORIDE 0.9 % IV SOLN
150.0000 mg | Freq: Once | INTRAVENOUS | Status: AC
  Administered 2022-10-06: 150 mg via INTRAVENOUS
  Filled 2022-10-06: qty 150

## 2022-10-06 MED ORDER — SODIUM CHLORIDE 0.9 % IV SOLN: Freq: Once | INTRAVENOUS | Status: AC

## 2022-10-06 MED ORDER — SODIUM CHLORIDE 0.9 % IV SOLN
131.2500 mg/m2 | Freq: Once | INTRAVENOUS | Status: AC
  Administered 2022-10-06: 222 mg via INTRAVENOUS
  Filled 2022-10-06: qty 37

## 2022-10-06 MED ORDER — HEPARIN SOD (PORK) LOCK FLUSH 100 UNIT/ML IV SOLN
500.0000 [IU] | Freq: Once | INTRAVENOUS | Status: AC | PRN
  Administered 2022-10-06: 500 [IU]

## 2022-10-06 NOTE — Progress Notes (Signed)
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 Andrey Farmer since she is an established patient with Dr. Andrey Farmer I will get my navigator to adjust the appointment with Dr. Andrey Farmer at [facility]  Peripheral neuropathy due to chemotherapy [facility]) We discussed importance of cryotherapy  No orders of the defined types were placed in this encounter.   All questions were answered. The patient knows to call the clinic with any problems, questions or concerns. The total time spent in the appointment was 25 minutes encounter with patients including review of chart and various tests results, discussions about plan of care and coordination of care plan   Artis Delay, MD 10/06/2022 11:23 AM  INTERVAL HISTORY: Please see below for problem oriented charting. she returns for treatment follow-up seen prior to cycle 2 of treatment She had very transient neuropathy but that has since resolved Denies nausea or constipation We discussed her appointment at [facility]  REVIEW OF SYSTEMS:   Constitutional: Denies fevers, chills or abnormal weight loss Eyes: Denies blurriness of vision Ears, nose, mouth, throat, and face: Denies mucositis or sore throat Respiratory: Denies cough, dyspnea or wheezes Cardiovascular: Denies palpitation, chest discomfort or lower extremity swelling Gastrointestinal:  Denies nausea, heartburn or change in bowel habits Skin: Denies abnormal skin rashes Lymphatics: Denies new lymphadenopathy or easy bruising Behavioral/Psych: Mood is stable, no new changes  All other systems were reviewed with the patient  and are negative.  I have reviewed the past medical history, past surgical history, social history and family history with the patient and they are unchanged from previous note.  ALLERGIES:  is allergic to skin adhesives [cyanoacrylate], shrimp [shellfish allergy], and sulfonamide derivatives.  MEDICATIONS:  Current Outpatient Medications  Medication Sig Dispense Refill   ALPRAZolam (XANAX) 0.25 MG tablet Take 1 tablet by mouth at bedtime as needed for anxiety. 30 tablet 0   carboxymethylcellulose (REFRESH PLUS) 0.5 % SOLN Place 1 drop into both eyes 2 (two) times daily as needed (dry eyes).     Cholecalciferol (VITAMIN D3) 25 MCG (1000 UT) CAPS Take 1,000 Units by mouth daily.      dexamethasone (DECADRON) 4 MG tablet Take 2 tabs at the night before and 2 tab the morning of chemotherapy, every 3 weeks, by mouth x 6 cycles 24 tablet 6   esomeprazole (NEXIUM) 20 MG capsule Take 20 mg by mouth daily.      estradiol (ESTRACE VAGINAL) 0.1 MG/GM vaginal cream Place 1 applicatorful vaginally 3 (three) times a week. 42.5 g 12   furosemide (LASIX) 20 MG tablet Take 1 tablet (20 mg total) by mouth daily. 60 tablet 1   lidocaine-prilocaine (EMLA) cream Apply topically as needed. 30 g 0   lisinopril (ZESTRIL) 20 MG tablet Take 1 tablet (20 mg total) by mouth daily. 90 tablet 1   loratadine (CLARITIN) 10 MG tablet Take 10 mg by mouth daily as needed for allergies.     Multiple Vitamin (MULTIVITAMIN WITH MINERALS) TABS tablet Take 1 tablet by mouth daily.     ondansetron (ZOFRAN) 8 MG tablet Take 1 tablet by mouth 2 times daily as needed. Start on  the third day after chemotherapy. 30 tablet 1   prochlorperazine (COMPAZINE) 10 MG tablet Take 1 tablet by mouth every 6 hours as needed (Nausea or vomiting). 30 tablet 1   No current facility-administered medications for this visit.   Facility-Administered Medications Ordered in Other Visits  Medication Dose Route Frequency Provider Last Rate Last Admin    CARBOplatin (PARAPLATIN) 480 mg in sodium chloride 0.9 % 250 mL chemo infusion  480 mg Intravenous Once [name], [place]deen Hafen, MD       dexamethasone (DECADRON) 10 mg in sodium chloride 0.9 % 50 mL IVPB  10 mg Intravenous Once Artis Delay, MD 204 mL/hr at 10/06/22 1111 10 mg at 10/06/22 1111   fosaprepitant (EMEND) 150 mg in sodium chloride 0.9 % 145 mL IVPB  150 mg Intravenous Once Artis Delay, MD 450 mL/hr at 10/06/22 1111 150 mg at 10/06/22 1111   heparin lock flush 100 unit/mL  500 Units Intracatheter Once PRN Artis Delay, MD       PACLitaxel (TAXOL) 222 mg in sodium chloride 0.9 % 250 mL chemo infusion (> 80mg /m2)  131.25 mg/m2 (Treatment Plan Recorded) Intravenous Once [name], Ivann Trimarco, MD       sodium chloride flush (NS) 0.9 % injection 10 mL  10 mL Intracatheter PRN [name], Roselene Gray, MD        SUMMARY OF ONCOLOGIC HISTORY: Oncology History Overview Note  Her 2 negative, MSI stable, serous, 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 14.7 cm poorly defined soft tissue mass in central pelvis suspicious for primary ovarian carcinoma, with uterine leiomyosarcoma considered a less likely differential diagnosis. 2. Moderate ascites and diffuse peritoneal carcinomatosis. 3. Several small uterine fibroids.   11/13/2018 Tumor Marker   Patient's tumor was tested for the following markers: CA-125 Results of the tumor marker test revealed 1434   11/21/2018 Procedure   Successful ultrasound-guided diagnostic and therapeutic paracentesis yielding 3.1 liters of peritoneal fluid   11/21/2018 Pathology Results   PERITONEAL/ASCITIC FLUID(SPECIMEN 1 OF 1 COLLECTED 11/21/18): ADENOCARCINOMA. Specimen Clinical Information Pelvic mass suspicious for ovarian cancer Source Peritoneal/Ascitic Fluid, (specimen 1 of 1 collected 11/21/18) Gross Specimen: Received is/are 1000 ccs of dark amber fluid.  (CM:cm) Prepared: # Smears: 0 # Concentration Technique Slides (i.e. 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   11/29/2018 Initial Diagnosis   Ovarian cancer (HCC)   11/29/2018 Cancer Staging   Staging form: Ovary, Fallopian Tube, and Primary Peritoneal Carcinoma, AJCC 8th Edition - Clinical: cT3, cN0, cM0 - Signed by Artis Delay, MD on 11/29/2018   12/03/2018 Procedure   Successful ultrasound-guided therapeutic paracentesis yielding 3.7 liters of peritoneal fluid.     12/06/2018 Procedure   Placement of a subcutaneous port device. Catheter tip at the SVC and right atrium junction   12/14/2018 Procedure   Successful ultrasound-guided paracentesis yielding 2.9 L of peritoneal fluid   12/28/2018 Tumor Marker   Patient's tumor was tested for the following markers: CA-125 Results of the tumor marker test revealed 812.   12/28/2018 - 04/22/2019 Chemotherapy   The patient had carboplatin and taxol for neoadjuvant treatment, followed by interval debulking surgery and subsequent adjuvant chemotherapy treatment.     02/01/2019 Imaging   Ct abdomen and pelvis 8.6 cm left ovarian mass, corresponding to the patient's known primary neoplasm, improved.   Mild peritoneal  nodularity/omental caking, improved.   Small abdominopelvic ascites, improved.     02/01/2019 Tumor Marker   Patient's tumor was tested for the following markers: CA-125 Results of the tumor marker test revealed 183.   02/12/2019 Surgery   Preoperative Diagnosis: Stage IIIC ovarian cancer, s/p neoadjuvant chemotherapy      Procedure(s) Performed: 1. Exploratory laparotomy with total abdominal hysterectomy, bilateral salpingo-oophorectomy, omentectomy radical tumor debulking for ovarian cancer.   Surgeon: Luisa Dago, MD.    Operative Findings: upper abdomen free of disease. No visible omental disease. Small volume (200cc) ascites.  8cm friable mass replacing left ovary and adherent to the sigmoid colon mesentery and ureter on the left.  Anterior fibroid. This represented an optimal cytoreduction (R0) with no gross visible disease remaining.    02/12/2019 Pathology Results   A. OVARY AND FALLOPIAN TUBE, LEFT, SALPINGO-OOPHORECTOMY: - Serous carcinoma, high grade, status post neoadjuvant therapy - See oncology table and comment below B. UTERUS CERVIX WITH RIGHT FALLOPIAN TUBE AND OVARY, HYSTERECTOMY: Uterus: - Serosal surface involved by serous carcinoma - Endomyometrium uninvolved by carcinoma - Benign endometrial polyp (4.1 cm) - Leiomyomata (5.5 cm; largest) - Adenomyosis Cervix: - Uninvolved by carcinoma Left ovary: - Serous carcinoma, high grade Left fallopian tube: - Serous carcinoma, high grade C. SOFT TISSUE, LEFT PELVIC SIDEWALL TUMOR, EXCISION: - Metastatic serous carcinoma, high-grade D. SOFT TISSUE, SIGMOID COLON MESENTERY, EXCISION: - Metastatic serous carcinoma, high-grade E. OMENTUM, TUMOR RESECTION: - Metastatic serous carcinoma, high-grade OVARY or FALLOPIAN TUBE or PRIMARY PERITONEUM: Procedure: Total hysterectomy and bilateral salpingo-oophorectomy, omentectomy and peritoneal biopsies Specimen Integrity: Fragmented Tumor Site: Left ovary and fallopian tube Ovarian Surface Involvement: Present Fallopian Tube Surface Involvement: Present Tumor Size: 6.3 cm (see comment) Histologic Type: Serous carcinoma Histologic Grade: High-grade Implants: Not applicable Other Tissue/ Organ Involvement: Right ovary, right fallopian tube, omentum, mesentery Largest Extrapelvic Peritoneal Focus: Macroscopic Peritoneal/Ascitic Fluid: Malignant Treatment Effect: No definite or minimal response identified (chemotherapy response score 1 [CRS1] Regional Lymph Nodes: No lymph nodes submitted or found Pathologic Stage Classification (pTNM, AJCC 8th Edition): pT3c, pNX Representative Tumor Block: A4 Comment(s):  Additional testing (HER-2, MMR and MSI) are pending. The primary tumor site appears to be the left ovary and fallopian tube. The uterus is only involved on the serosal surface. There is tumor on the anterior peritoneal reflection.  Addendum: Tumor is Her2 negative,MSI stable   02/19/2019 Tumor Marker   Patient's tumor was tested for the following markers: CA-125 Results of the tumor marker test revealed 40.3   04/01/2019 Tumor Marker   Patient's tumor was tested for the following markers: CA-125 Results of the tumor marker test revealed 8.1    Genetic Testing   Negative testing. No pathogenic variants identified on the [employer]. The report date is 04/17/2019.  Somatic genes analyzed through [facility]: ATM, BARD1, BRCA1, BRCA2, BRIP1, CHEK2, MRE11A, NBN, PALB2, RAD51C, RAD51D.  The [employer] gene panel offered by [employer] includes sequencing and rearrangement analysis for the following 36 genes: APC*, ATM*, AXIN2, BARD1, BMPR1A, BRCA1*, BRCA2*, BRIP1*, CDH1*, CDK4, CDKN2A, CHEK2*, DICER1, MLH1*, MSH2*, MSH3, MSH6*, MUTYH*, NBN, NF1*, NTHL1, PALB2*, PMS2*, PTEN*, RAD51C*, RAD51D*, RECQL, SMAD4, SMARCA4, STK11 and TP53* (sequencing and deletion/duplication); HOXB13, POLD1 and POLE (sequencing only); EPCAM and GREM1 (deletion/duplication only).    05/21/2019 Imaging   1. Interval hysterectomy, oophorectomy and omentectomy. No evidence of residual ovarian carcinoma in the pelvis. No evidence of peritoneal disease. No intraperitoneal free fluid. 2. Nonobstructing LEFT renal calculi.  Normal ureters.  05/22/2019 Tumor Marker   Patient's tumor was tested for the following markers: CA-125 Results of the tumor marker test revealed 5.7.   06/03/2019 Procedure   Successful right IJ vein Port-A-Cath explant.   08/26/2019 Tumor Marker   Patient's tumor was tested for the following markers: CA-125. Results of the tumor marker test revealed 4.9   10/03/2019 Imaging    1. New tumor deposits along the capsular surfaces of the liver, spleen, in the left pelvis, and right paracolic gutter, compatible with metastatic disease. 2. Mild dilation of the dorsal pancreatic duct in the pancreatic body and head, cause uncertain. 3. Nonobstructive left nephrolithiasis. 4. Aortic atherosclerosis.   Aortic Atherosclerosis (ICD10-I70.0   10/10/2019 Procedure   Successful placement of a right IJ approach Power Port with ultrasound and fluoroscopic guidance. The catheter is ready for use.     10/11/2019 Tumor Marker   Patient's tumor was tested for the following markers: CA-125 Results of the tumor marker test revealed 19.6.   10/14/2019 -  Chemotherapy   The patient had carboplatin and gemzar for chemotherapy treatment.     11/04/2019 Tumor Marker   Patient's tumor was tested for the following markers: CA-125 Results of the tumor marker test revealed 11.4   11/28/2019 Tumor Marker   Patient's tumor was tested for the following markers: CA-125 Results of the tumor marker test revealed 5.8   12/20/2019 Imaging   1. Significant interval reduction in mixed solid and cystic nodule in the vicinity of the left ovary, with significant improvement or resolution of multiple pelvic, peritoneal, and organ capsule implants. Resolution of previously seen small volume ascites. Findings are consistent with treatment response of abdominal metastatic disease. 2. Status post hysterectomy, oophorectomy, and omentectomy. 3. Nonobstructive left nephrolithiasis. 4. Aortic Atherosclerosis (ICD10-I70.0).   12/23/2019 Tumor Marker   Patient's tumor was tested for the following markers: CA-125 Results of the tumor marker test revealed 5.0   01/24/2020 Tumor Marker   Patient's tumor was tested for the following markers: CA-125 Results of the tumor marker test revealed 4.8   02/24/2020 Tumor Marker   Patient's tumor was tested for the following markers: CA-125. Results of the tumor marker  test revealed 4.3.   03/06/2020 Imaging   1. Status post hysterectomy, bilateral oophorectomy, and omentectomy. 2. Resolution of previously described left adnexal nodularity. 3. No residual disease identified. 4.  Aortic Atherosclerosis (ICD10-I70.0). 5. Left nephrolithiasis.   04/20/2020 Tumor Marker   Patient's tumor was tested for the following markers: CA-125 Results of the tumor marker test revealed 4.6   05/18/2020 Tumor Marker   Patient's tumor was tested for the following markers: CA-125 Results of the tumor marker test revealed 4.7   05/29/2020 Imaging   Status post hysterectomy and bilateral salpingo oophorectomy.   No evidence of recurrent or metastatic disease.   06/08/2020 - 09/13/2021 Chemotherapy   She was on niraparib       06/17/2020 Tumor Marker   Patient's tumor was tested for the following markers: CA-125 Results of the tumor marker test revealed 4.5   08/04/2020 Tumor Marker   Patient's tumor was tested for the following markers: CA-125. Results of the tumor marker test revealed 5.7   09/07/2020 Imaging   1. Status post hysterectomy and bilateral salpingo oophorectomy. No evidence of recurrent or metastatic disease. 2. Nonobstructing left nephrolithiasis. 3. Left-sided colonic diverticulosis without findings of acute diverticulitis. 4. Aortic atherosclerosis.     09/07/2020 Tumor Marker   Patient's tumor was tested for the following markers:  CA-125 Results of the tumor marker test revealed 4.8.   10/21/2020 Tumor Marker   Patient's tumor was tested for the following markers: CA-125. Results of the tumor marker test revealed 5.1.   12/14/2020 Tumor Marker   Patient's tumor was tested for the following markers: CA-125. Results of the tumor marker test revealed 5.5.   04/05/2021 Tumor Marker   Patient's tumor was tested for the following markers: CA-125. Results of the tumor marker test revealed 5.9.   05/31/2021 Tumor Marker   Patient's tumor was  tested for the following markers: CA-125. Results of the tumor marker test revealed 8.1.   09/13/2021 Imaging   1. Interval development of multiple new cystic and solid lesions in the abdomen and pelvis measuring up to 10.3 x 7.2 cm and consistent with peritoneal metastatic disease. 2. Nonobstructing left renal stones. 3. Aortic Atherosclerosis (ICD10-I70.0).     09/14/2021 Tumor Marker   Patient's tumor was tested for the following markers: CA-125. Results of the tumor marker test revealed 28.5.   09/16/2021 Echocardiogram   1. Left ventricular ejection fraction, by estimation, is 60 to 65%. The left ventricle has normal function. The left ventricle has no regional wall motion abnormalities. Left ventricular diastolic parameters are consistent with Grade I diastolic dysfunction (impaired relaxation).  2. Right ventricular systolic function is normal. The right ventricular size is normal.  3. The mitral valve is normal in structure. Trivial mitral valve regurgitation. No evidence of mitral stenosis.  4. The aortic valve has an indeterminant number of cusps. Aortic valve regurgitation is trivial. No aortic stenosis is present.  5. The inferior vena cava is normal in size with greater than 50% respiratory variability, suggesting right atrial pressure of 3 mmHg.     09/20/2021 - 01/17/2022 Chemotherapy   Patient is on Treatment Plan : OVARIAN RECURRENT Liposomal Doxorubicin + Carboplatin q28d X 6 Cycles     09/20/2021 - 02/22/2022 Chemotherapy   Patient is on Treatment Plan : OVARIAN RECURRENT Liposomal Doxorubicin + Carboplatin q28d X 6 Cycles     09/23/2021 Tumor Marker   Patient's tumor was tested for the following markers: CA-125. Results of the tumor marker test revealed 27.7.   11/24/2021 Tumor Marker   Patient's tumor was tested for the following markers: CA-125. Results of the tumor marker test revealed 28.4.   12/13/2021 Imaging   1. Overall stable peritoneal carcinomatosis. No new  disease is demonstrated. 2. Moderate left-sided hydroureteronephrosis likely due to compression of the left ureter by the left pelvic mass. 3. Stable left-sided renal calculi. 4. Numerous left renal calculi.     12/22/2021 Tumor Marker   Patient's tumor was tested for the following markers: CA-125. Results of the tumor marker test revealed 24.7.   01/18/2022 Tumor Marker   Patient's tumor was tested for the following markers: CA-125. Results of the tumor marker test revealed 9.9.   01/27/2022 Echocardiogram    1. Left ventricular ejection fraction, by estimation, is 60 to 65%. The left ventricle has normal function. The left ventricle has no regional wall motion abnormalities. Left ventricular diastolic parameters are consistent with Grade I diastolic dysfunction (impaired relaxation). The average left ventricular global longitudinal strain is -21.1 %. The global longitudinal strain is normal.  2. Right ventricular systolic function is normal. The right ventricular size is normal.  3. The mitral valve is abnormal. Mild mitral valve regurgitation.  4. The aortic valve is tricuspid. Aortic valve regurgitation is trivial.  5. The inferior vena cava is normal in  size with greater than 50% respiratory variability, suggesting right atrial pressure of 3 mmHg.   02/23/2022 Tumor Marker   Patient's tumor was tested for the following markers: CA-125. Results of the tumor marker test revealed 8.5.   03/18/2022 Imaging   1. Diminished size of nodular and mixed solid and cystic peritoneal implants in the hepatorenal fossa, right lower quadrant, and left pelvis. 2. Unchanged solid mass in the right pelvis measuring 4.0 x 3.7 cm. 3. Constellation of findings is overall consistent with treatment response 4. Status post hysterectomy, oophorectomy, and omentectomy. 5. Nonobstructive left nephrolithiasis.   Aortic Atherosclerosis (ICD10-I70.0).     03/22/2022 - 08/16/2022 Chemotherapy   Patient is on  Treatment Plan : Ovarian Bevacizumab q21d     03/24/2022 Tumor Marker   Patient's tumor was tested for the following markers: CA-125. Results of the tumor marker test revealed 7.1.   04/28/2022 Tumor Marker   Patient's tumor was tested for the following markers: CA-125. Results of the tumor marker test revealed 7.0.   05/27/2022 Tumor Marker   Patient's tumor was tested for the following markers: CA-125. Results of the tumor marker test revealed 7.7.   06/14/2022 Imaging   1. Slight response to therapy as evidenced by decrease in size of a cystic and solid mass in the left adnexa as well as peritoneal implants in the right paracolic gutter. Solid right adnexal mass and additional peritoneal implants along right hepatic lobe and in the left anatomic pelvis appear stable. 2. Hepatic steatosis. 3. Left renal stones. 4.  Aortic atherosclerosis (ICD10-I70.0).   06/17/2022 Tumor Marker   Patient's tumor was tested for the following markers: CA-125. Results of the tumor marker test revealed 11.2.   09/05/2022 Imaging   CT ABDOMEN PELVIS W CONTRAST  Result Date: 09/06/2022 CLINICAL DATA:  Ovarian cancer.  Restaging.  * Tracking Code: BO * EXAM: CT ABDOMEN AND PELVIS WITH CONTRAST TECHNIQUE: Multidetector CT imaging of the abdomen and pelvis was performed using the standard protocol following bolus administration of intravenous contrast. RADIATION DOSE REDUCTION: This exam was performed according to the departmental dose-optimization program which includes automated exposure control, adjustment of the mA and/or kV according to patient size and/or use of iterative reconstruction technique. CONTRAST:  OMNIPAQUE IOHEXOL 300 MG/ML  SOLN COMPARISON:  06/13/2022 FINDINGS: Lower chest: Unremarkable. Hepatobiliary: No suspicious focal abnormality within the liver parenchyma. There is no evidence for gallstones, gallbladder wall thickening, or pericholecystic fluid. No intrahepatic or extrahepatic biliary  dilation. Pancreas: No pancreatic mass lesion evident. Diffuse prominence of the main pancreatic duct is stable measuring up to 3-4 mm diameter in the head of the pancreas. Spleen: No splenomegaly. No focal mass lesion. Adrenals/Urinary Tract: No adrenal nodule or mass. Right kidney unremarkable. A cluster of nonobstructing stones in the upper pole right kidney is again noted with dominant stone measuring about 6 mm in size. No evidence for hydroureter. The urinary bladder appears normal for the degree of distention. Stomach/Bowel: Tiny hiatal hernia. Stomach otherwise unremarkable. Duodenum is normally positioned as is the ligament of Treitz. No small bowel wall thickening. No small bowel dilatation. The terminal ileum is normal. The appendix is normal. No gross colonic mass. No colonic wall thickening. Diverticular changes are noted in the left colon without evidence of diverticulitis. Vascular/Lymphatic: There is mild atherosclerotic calcification of the abdominal aorta without aneurysm. There is no gastrohepatic or hepatoduodenal ligament lymphadenopathy. No retroperitoneal or mesenteric lymphadenopathy. No pelvic sidewall lymphadenopathy. Reproductive: Right-sided adnexal mass measured previously at 4.2 x  3.8 cm is now 4.9 x 4.6 cm. Cystic and solid left adnexal lesion measured previously at 4.1 x 3.4 cm is now 3.2 x 2.9 cm. 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 The more anterior of the 2 index lesions measures 2.6 x 2.1 cm today compared to 2.4 x 1.6 cm previously. The more posterior lesion is 2.4 x 1.4 cm today compared to 2.0 x 1.9 cm previously. 5 mm soft tissue nodule identified in the inferior paracolic gutter previously at 5 mm measures 10 mm today on image 51/2. Implants along the left posterior peritoneum adjacent to the psoas muscle and iliac vessels appear progressive. 1.6 x 1.3 cm lesion visible on 58/2 was 1.1 x 0.8 cm previously (remeasured). No evidence for ascites.  Musculoskeletal: No worrisome lytic or sclerotic osseous abnormality. IMPRESSION: 1. Overall generalized appearance of mild disease progression. 2. Index right-sided adnexal mass has progressed since the prior with interval decrease in size of the cystic and solid left adnexal lesion. 3. Interval progression of index capsular implants along the medial right liver. 4. Interval progression of soft tissue nodule in the inferior right paracolic gutter and peritoneal implants in the lower left abdomen and pelvis. 5. No evidence for ascites. 6. Nonobstructing left renal stones. 7. Tiny hiatal hernia. 8. Left colonic diverticulosis without diverticulitis. 9.  Aortic Atherosclerosis (ICD10-I70.0). Electronically Signed   By: Kennith Center M.D.   On: 09/06/2022 07:57      09/15/2022 -  Chemotherapy   Patient is on Treatment Plan : OVARIAN Carboplatin (AUC 6) + Paclitaxel (175) q21d X 6 Cycles       PHYSICAL EXAMINATION: ECOG PERFORMANCE STATUS: 1 - Symptomatic but completely ambulatory  Vitals:   10/06/22 0951  BP: (!) 162/92  Pulse: (!) 106  Resp: 18  SpO2: 100%   Filed Weights   10/06/22 0951  Weight: 141 lb (64 kg)    GENERAL:alert, no distress and comfortable  NEURO: alert & oriented x 3 with fluent speech, no focal motor/sensory deficits  LABORATORY DATA:  I have reviewed the data as listed    Component Value Date/Time   NA 139 10/06/2022 0917   K 3.9 10/06/2022 0917   CL 105 10/06/2022 0917   CO2 26 10/06/2022 0917   GLUCOSE 223 (H) 10/06/2022 0917   BUN 14 10/06/2022 0917   CREATININE 0.62 10/06/2022 0917   CALCIUM 9.5 10/06/2022 0917   PROT 6.9 10/06/2022 0917   ALBUMIN 4.3 10/06/2022 0917   AST 53 (H) 10/06/2022 0917   ALT 50 (H) 10/06/2022 0917   ALKPHOS 83 10/06/2022 0917   BILITOT 0.7 10/06/2022 0917   GFRNONAA >60 10/06/2022 0917   GFRAA >60 02/24/2020 0852    No results found for: "SPEP", "UPEP"  Lab Results  Component Value Date   WBC 7.1 10/06/2022    NEUTROABS 6.5 10/06/2022   HGB 13.4 10/06/2022   HCT 38.3 10/06/2022   MCV 93.2 10/06/2022   PLT 161 10/06/2022      Chemistry      Component Value Date/Time   NA 139 10/06/2022 0917   K 3.9 10/06/2022 0917   CL 105 10/06/2022 0917   CO2 26 10/06/2022 0917   BUN 14 10/06/2022 0917   CREATININE 0.62 10/06/2022 0917      Component Value Date/Time   CALCIUM 9.5 10/06/2022 0917   ALKPHOS 83 10/06/2022 0917   AST 53 (H) 10/06/2022 0917   ALT 50 (H) 10/06/2022 0917   BILITOT 0.7 10/06/2022  0917      

## 2022-10-06 NOTE — Assessment & Plan Note (Signed)
We discussed importance of cryotherapy

## 2022-10-06 NOTE — Assessment & Plan Note (Signed)
So far, she tolerated treatment well except for transient neuropathy which has since resolved Her elevated blood pressure is likely due to anxiety We discussed the role of second opinion I would prefer for her to see Dr. Andrey Farmer since she is an established patient with Dr. Andrey Farmer I will get my navigator to adjust the appointment with Dr. Andrey Farmer at [facility]

## 2022-10-06 NOTE — Patient Instructions (Signed)
[facility]  Discharge Instructions: Thank you for choosing [facility] to provide your oncology and hematology care.   If you have a lab appointment with the Cancer Center, please go directly to the Cancer Center and check in at the registration area.   Wear comfortable clothing and clothing appropriate for easy access to any Portacath or PICC line.   We strive to give you quality time with your provider. You may need to reschedule your appointment if you arrive late (15 or more minutes).  Arriving late affects you and other patients whose appointments are after yours.  Also, if you miss three or more appointments without notifying the office, you may be dismissed from the clinic at the provider's discretion.      For prescription refill requests, have your pharmacy contact our office and allow 72 hours for refills to be completed.    Today you received the following chemotherapy and/or immunotherapy agents: Paclitaxel, Carboplatin.       To help prevent nausea and vomiting after your treatment, we encourage you to take your nausea medication as directed.  BELOW ARE SYMPTOMS THAT SHOULD BE REPORTED IMMEDIATELY: *FEVER GREATER THAN 100.4 F (38 C) OR HIGHER *CHILLS OR SWEATING *NAUSEA AND VOMITING THAT IS NOT CONTROLLED WITH YOUR NAUSEA MEDICATION *UNUSUAL SHORTNESS OF BREATH *UNUSUAL BRUISING OR BLEEDING *URINARY PROBLEMS (pain or burning when urinating, or frequent urination) *BOWEL PROBLEMS (unusual diarrhea, constipation, pain near the anus) TENDERNESS IN MOUTH AND THROAT WITH OR WITHOUT PRESENCE OF ULCERS (sore throat, sores in mouth, or a toothache) UNUSUAL RASH, SWELLING OR PAIN  UNUSUAL VAGINAL DISCHARGE OR ITCHING   Items with * indicate a potential emergency and should be followed up as soon as possible or go to the Emergency Department if any problems should occur.  Please show the CHEMOTHERAPY ALERT CARD or IMMUNOTHERAPY  ALERT CARD at check-in to the Emergency Department and triage nurse.  Should you have questions after your visit or need to cancel or reschedule your appointment, please contact [facility]  Dept: [phone]  and follow the prompts.  Office hours are 8:00 a.m. to 4:30 p.m. Monday - Friday. Please note that voicemails left after 4:00 p.m. may not be returned until the following business day.  We are closed weekends and major holidays. You have access to a nurse at all times for urgent questions. Please call the main number to the clinic Dept: [phone] and follow the prompts.   For any non-urgent questions, you may also contact your provider using MyChart. We now offer e-Visits for anyone 18 and older to request care online for non-urgent symptoms. For details visit [email or web address].   Also download the MyChart app! Go to the app store, search "MyChart", open the app, select [facility], and log in with your MyChart username and password.   

## 2022-10-06 NOTE — Progress Notes (Signed)
[facility] CSW Progress Note  Visual merchandiser met with patient to provide support. Pt shaved her head yesterday as she was having hair loss from chemo. She feels mentally ok about this. Pt did become tearful at times thinking about how this time with chemo feels different than the first round, but she continues to try to be encouraging to others. She is also finding joy in a new grand-nephew.  Pt will be seeing Dr. Andrey Farmer for a 2nd opinion in June.    Stacy Rivas E Stacy Ghan, LCSW Clinical Social Worker [facility]

## 2022-10-06 NOTE — Telephone Encounter (Signed)
Called [facility] and spoke to [place].  Advised that Andrey Campanile would like to see Dr. Andrey Farmer for a second opinion because she was her surgeon originally.  Dr. Oliver Hum first opening is on 10/31/22 and Ian Malkin will call [facility] to schedule the appointment.

## 2022-10-07 ENCOUNTER — Other Ambulatory Visit: Payer: Self-pay

## 2022-10-26 MED FILL — Dexamethasone Sodium Phosphate Inj 100 MG/10ML: INTRAMUSCULAR | Qty: 1 | Status: AC

## 2022-10-26 MED FILL — Fosaprepitant Dimeglumine For IV Infusion 150 MG (Base Eq): INTRAVENOUS | Qty: 5 | Status: AC

## 2022-10-27 ENCOUNTER — Inpatient Hospital Stay (HOSPITAL_BASED_OUTPATIENT_CLINIC_OR_DEPARTMENT_OTHER): Payer: Medicare Other | Admitting: Hematology and Oncology

## 2022-10-27 ENCOUNTER — Inpatient Hospital Stay: Payer: Medicare Other | Admitting: Licensed Clinical Social Worker

## 2022-10-27 ENCOUNTER — Inpatient Hospital Stay: Payer: Medicare Other

## 2022-10-27 ENCOUNTER — Inpatient Hospital Stay: Payer: Medicare Other | Attending: Gynecologic Oncology

## 2022-10-27 ENCOUNTER — Encounter: Payer: Self-pay | Admitting: Hematology and Oncology

## 2022-10-27 VITALS — BP 143/73 | HR 93 | Temp 98.2°F | Resp 18 | Ht 62.0 in | Wt 141.8 lb

## 2022-10-27 VITALS — BP 159/90 | HR 89 | Temp 98.1°F | Resp 18

## 2022-10-27 DIAGNOSIS — Z7189 Other specified counseling: Secondary | ICD-10-CM

## 2022-10-27 DIAGNOSIS — Z5111 Encounter for antineoplastic chemotherapy: Secondary | ICD-10-CM | POA: Insufficient documentation

## 2022-10-27 DIAGNOSIS — C786 Secondary malignant neoplasm of retroperitoneum and peritoneum: Secondary | ICD-10-CM

## 2022-10-27 DIAGNOSIS — T451X5A Adverse effect of antineoplastic and immunosuppressive drugs, initial encounter: Secondary | ICD-10-CM | POA: Diagnosis not present

## 2022-10-27 DIAGNOSIS — C562 Malignant neoplasm of left ovary: Secondary | ICD-10-CM

## 2022-10-27 DIAGNOSIS — D6481 Anemia due to antineoplastic chemotherapy: Secondary | ICD-10-CM | POA: Diagnosis not present

## 2022-10-27 DIAGNOSIS — C563 Malignant neoplasm of bilateral ovaries: Secondary | ICD-10-CM | POA: Insufficient documentation

## 2022-10-27 LAB — CBC WITH DIFFERENTIAL (CANCER CENTER ONLY)
Abs Immature Granulocytes: 0.02 10*3/uL (ref 0.00–0.07)
Basophils Absolute: 0 10*3/uL (ref 0.0–0.1)
Basophils Relative: 0 %
Eosinophils Absolute: 0 10*3/uL (ref 0.0–0.5)
Eosinophils Relative: 0 %
HCT: 33.6 % — ABNORMAL LOW (ref 36.0–46.0)
Hemoglobin: 11.6 g/dL — ABNORMAL LOW (ref 12.0–15.0)
Immature Granulocytes: 0 %
Lymphocytes Relative: 11 %
Lymphs Abs: 0.7 10*3/uL (ref 0.7–4.0)
MCH: 32.8 pg (ref 26.0–34.0)
MCHC: 34.5 g/dL (ref 30.0–36.0)
MCV: 94.9 fL (ref 80.0–100.0)
Monocytes Absolute: 0.2 10*3/uL (ref 0.1–1.0)
Monocytes Relative: 3 %
Neutro Abs: 5.4 10*3/uL (ref 1.7–7.7)
Neutrophils Relative %: 86 %
Platelet Count: 101 10*3/uL — ABNORMAL LOW (ref 150–400)
RBC: 3.54 MIL/uL — ABNORMAL LOW (ref 3.87–5.11)
RDW: 14.1 % (ref 11.5–15.5)
WBC Count: 6.3 10*3/uL (ref 4.0–10.5)
nRBC: 0 % (ref 0.0–0.2)

## 2022-10-27 LAB — CMP (CANCER CENTER ONLY)
ALT: 59 U/L — ABNORMAL HIGH (ref 0–44)
AST: 46 U/L — ABNORMAL HIGH (ref 15–41)
Albumin: 4.2 g/dL (ref 3.5–5.0)
Alkaline Phosphatase: 158 U/L — ABNORMAL HIGH (ref 38–126)
Anion gap: 8 (ref 5–15)
BUN: 12 mg/dL (ref 8–23)
CO2: 25 mmol/L (ref 22–32)
Calcium: 9.2 mg/dL (ref 8.9–10.3)
Chloride: 105 mmol/L (ref 98–111)
Creatinine: 0.51 mg/dL (ref 0.44–1.00)
GFR, Estimated: >60 mL/min (ref >60)
Glucose, Bld: 159 mg/dL — ABNORMAL HIGH (ref 70–99)
Potassium: 3.7 mmol/L (ref 3.5–5.1)
Sodium: 138 mmol/L (ref 135–145)
Total Bilirubin: 1.1 mg/dL (ref 0.3–1.2)
Total Protein: 6.7 g/dL (ref 6.5–8.1)

## 2022-10-27 MED ORDER — HEPARIN SOD (PORK) LOCK FLUSH 100 UNIT/ML IV SOLN
500.0000 [IU] | Freq: Once | INTRAVENOUS | Status: AC | PRN
  Administered 2022-10-27: 500 [IU]

## 2022-10-27 MED ORDER — FAMOTIDINE IN NACL 20-0.9 MG/50ML-% IV SOLN
20.0000 mg | Freq: Once | INTRAVENOUS | Status: AC
  Administered 2022-10-27: 20 mg via INTRAVENOUS
  Filled 2022-10-27: qty 50

## 2022-10-27 MED ORDER — PALONOSETRON HCL INJECTION 0.25 MG/5ML
0.2500 mg | Freq: Once | INTRAVENOUS | Status: AC
  Administered 2022-10-27: 0.25 mg via INTRAVENOUS
  Filled 2022-10-27: qty 5

## 2022-10-27 MED ORDER — CETIRIZINE HCL 10 MG/ML IV SOLN
10.0000 mg | Freq: Once | INTRAVENOUS | Status: AC
  Administered 2022-10-27: 10 mg via INTRAVENOUS
  Filled 2022-10-27: qty 1

## 2022-10-27 MED ORDER — SODIUM CHLORIDE 0.9% FLUSH
10.0000 mL | INTRAVENOUS | Status: DC | PRN
  Administered 2022-10-27: 10 mL

## 2022-10-27 MED ORDER — SODIUM CHLORIDE 0.9 % IV SOLN
150.0000 mg | Freq: Once | INTRAVENOUS | Status: AC
  Administered 2022-10-27: 150 mg via INTRAVENOUS
  Filled 2022-10-27: qty 150

## 2022-10-27 MED ORDER — SODIUM CHLORIDE 0.9 % IV SOLN
131.2500 mg/m2 | Freq: Once | INTRAVENOUS | Status: AC
  Administered 2022-10-27: 222 mg via INTRAVENOUS
  Filled 2022-10-27: qty 37

## 2022-10-27 MED ORDER — SODIUM CHLORIDE 0.9 % IV SOLN
478.2000 mg | Freq: Once | INTRAVENOUS | Status: AC
  Administered 2022-10-27: 480 mg via INTRAVENOUS
  Filled 2022-10-27: qty 48

## 2022-10-27 MED ORDER — SODIUM CHLORIDE 0.9 % IV SOLN: Freq: Once | INTRAVENOUS | Status: AC

## 2022-10-27 MED ORDER — SODIUM CHLORIDE 0.9 % IV SOLN
10.0000 mg | Freq: Once | INTRAVENOUS | Status: AC
  Administered 2022-10-27: 10 mg via INTRAVENOUS
  Filled 2022-10-27: qty 10

## 2022-10-27 MED ORDER — SODIUM CHLORIDE 0.9% FLUSH
10.0000 mL | Freq: Once | INTRAVENOUS | Status: AC
  Administered 2022-10-27: 10 mL

## 2022-10-27 NOTE — Assessment & Plan Note (Signed)
So far, she tolerated treatment well except for transient neuropathy which has since resolved, mild anemia, intermittent headaches and mild arthralgias Overall, she felt that she tolerated treatment fairly well We will proceed with cycle 3 as scheduled I plan to order imaging study prior to cycle 4 therapy

## 2022-10-27 NOTE — Progress Notes (Signed)
[facility] CSW Progress Note  Visual merchandiser met with patient to provide support. Pt reports doing fairly well recently. She as her appt with Dr. Andrey Farmer tomorrow and scans later this month that she is anticipating. No major needs today. CSW will plan to see pt during next infusion.    Stacy Rivas E Thaddaeus Granja, LCSW Clinical Social Worker [facility]

## 2022-10-27 NOTE — Assessment & Plan Note (Signed)

## 2022-10-27 NOTE — Progress Notes (Signed)
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 The patient denies recent history of bleeding such as epistaxis, hematuria or hematochezia. She is asymptomatic from the anemia. I will observe for now.  She does not require transfusion now. I will continue the chemotherapy at current dose without dosage adjustment.  If the anemia gets progressive worse in the future, I might have to delay her treatment or adjust the chemotherapy dose.   Orders Placed This Encounter  Procedures   CT ABDOMEN PELVIS W CONTRAST    Standing Status:   Future    Standing Expiration Date:   10/27/2023    Order Specific Question:   If indicated for the ordered procedure, I authorize the administration of contrast media per Radiology protocol    Answer:   Yes    Order Specific Question:   Does the patient have a contrast media/X-ray dye allergy?    Answer:   No    Order Specific Question:   Preferred imaging location?    Answer:   [facility]    Order Specific Question:   If indicated for the ordered procedure, I authorize the administration of oral contrast media per Radiology protocol    Answer:   Yes    All questions were answered. The patient knows to call the clinic with any problems, questions or concerns. The total time spent in the appointment was 25 minutes encounter with patients including review of chart and various tests results,  discussions about plan of care and coordination of care plan   Artis Delay, MD 10/27/2022 9:18 AM  INTERVAL HISTORY: Please see below for problem oriented charting. she returns for treatment follow-up seen prior to cycle 3 of chemotherapy She complain of mild fatigue Denies nausea, abdominal pain or bloating She has occasional headaches and arthralgias No worsening peripheral neuropathy  REVIEW OF SYSTEMS:   Constitutional: Denies fevers, chills or abnormal weight loss Eyes: Denies blurriness of vision Ears, nose, mouth, throat, and face: Denies mucositis or sore throat Respiratory: Denies cough, dyspnea or wheezes Cardiovascular: Denies palpitation, chest discomfort or lower extremity swelling Gastrointestinal:  Denies nausea, heartburn or change in bowel habits Skin: Denies abnormal skin rashes Lymphatics: Denies new lymphadenopathy or easy bruising Neurological:Denies numbness, tingling or new weaknesses Behavioral/Psych: Mood is stable, no new changes  All other systems were reviewed with the patient and are negative.  I have reviewed the past medical history, past surgical history, social history and family history with the patient and they are unchanged from previous note.  ALLERGIES:  is allergic to skin adhesives [cyanoacrylate], shrimp [shellfish allergy], and sulfonamide derivatives.  MEDICATIONS:  Current Outpatient Medications  Medication Sig Dispense Refill   ALPRAZolam (XANAX) 0.25 MG tablet Take 1 tablet by mouth at bedtime as needed for anxiety. 30 tablet 0   carboxymethylcellulose (REFRESH PLUS) 0.5 % SOLN Place 1 drop into both eyes 2 (two) times daily as needed (dry eyes).     Cholecalciferol (VITAMIN D3)  25 MCG (1000 UT) CAPS Take 1,000 Units by mouth daily.      dexamethasone (DECADRON) 4 MG tablet Take 2 tabs at the night before and 2 tab the morning of chemotherapy, every 3 weeks, by mouth x 6 cycles 24 tablet 6   esomeprazole (NEXIUM) 20 MG capsule Take 20 mg  by mouth daily.      estradiol (ESTRACE VAGINAL) 0.1 MG/GM vaginal cream Place 1 applicatorful vaginally 3 (three) times a week. 42.5 g 12   furosemide (LASIX) 20 MG tablet Take 1 tablet (20 mg total) by mouth daily. 60 tablet 1   lidocaine-prilocaine (EMLA) cream Apply topically as needed. 30 g 0   lisinopril (ZESTRIL) 20 MG tablet Take 1 tablet (20 mg total) by mouth daily. 90 tablet 1   loratadine (CLARITIN) 10 MG tablet Take 10 mg by mouth daily as needed for allergies.     Multiple Vitamin (MULTIVITAMIN WITH MINERALS) TABS tablet Take 1 tablet by mouth daily.     ondansetron (ZOFRAN) 8 MG tablet Take 1 tablet by mouth 2 times daily as needed. Start on the third day after chemotherapy. 30 tablet 1   prochlorperazine (COMPAZINE) 10 MG tablet Take 1 tablet by mouth every 6 hours as needed (Nausea or vomiting). 30 tablet 1   No current facility-administered medications for this visit.    SUMMARY OF ONCOLOGIC HISTORY: Oncology History Overview Note  Her 2 negative, MSI stable, serous, 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 14.7 cm poorly defined soft tissue mass in central pelvis suspicious for primary ovarian carcinoma, with uterine leiomyosarcoma considered a less likely differential diagnosis. 2. Moderate ascites and diffuse peritoneal carcinomatosis. 3. Several small uterine fibroids.   11/13/2018 Tumor Marker   Patient's tumor was tested for the following markers: CA-125 Results of the tumor marker test revealed 1434   11/21/2018 Procedure   Successful ultrasound-guided diagnostic and therapeutic paracentesis yielding 3.1 liters of peritoneal fluid   11/21/2018 Pathology Results   PERITONEAL/ASCITIC FLUID(SPECIMEN 1 OF 1 COLLECTED 11/21/18): ADENOCARCINOMA. Specimen Clinical Information Pelvic mass suspicious for ovarian cancer Source Peritoneal/Ascitic  Fluid, (specimen 1 of 1 collected 11/21/18) Gross Specimen: Received is/are 1000 ccs of dark amber fluid. (CM:cm) Prepared: # Smears: 0 # Concentration Technique Slides (i.e. 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   11/29/2018 Initial Diagnosis   Ovarian cancer (HCC)   11/29/2018 Cancer Staging   Staging form: Ovary, Fallopian Tube, and Primary Peritoneal Carcinoma, AJCC 8th Edition - Clinical: cT3, cN0, cM0 - Signed by Artis Delay, MD on 11/29/2018   12/03/2018 Procedure   Successful ultrasound-guided therapeutic paracentesis yielding 3.7 liters of peritoneal fluid.     12/06/2018 Procedure   Placement of a subcutaneous port device. Catheter tip at the SVC and right atrium junction   12/14/2018 Procedure   Successful ultrasound-guided paracentesis yielding 2.9 L of peritoneal fluid   12/28/2018 Tumor Marker   Patient's tumor was tested for the following markers: CA-125 Results of the tumor marker test revealed 812.   12/28/2018 - 04/22/2019 Chemotherapy   The patient had carboplatin and taxol for neoadjuvant treatment, followed by interval debulking surgery and subsequent adjuvant chemotherapy treatment.     02/01/2019 Imaging   Ct abdomen and pelvis 8.6 cm left ovarian mass, corresponding to the patient's known primary neoplasm, improved.  Mild peritoneal nodularity/omental caking, improved.   Small abdominopelvic ascites, improved.     02/01/2019 Tumor Marker   Patient's tumor was tested for the following markers: CA-125 Results of the tumor marker test revealed 183.   02/12/2019 Surgery   Preoperative Diagnosis: Stage IIIC ovarian cancer, s/p neoadjuvant chemotherapy      Procedure(s) Performed: 1. Exploratory laparotomy with total abdominal hysterectomy, bilateral salpingo-oophorectomy, omentectomy radical tumor debulking for ovarian cancer.   Surgeon: Luisa Dago, MD.     Operative Findings: upper abdomen free of disease. No visible omental disease. Small volume (200cc) ascites. 8cm friable mass replacing left ovary and adherent to the sigmoid colon mesentery and ureter on the left.  Anterior fibroid. This represented an optimal cytoreduction (R0) with no gross visible disease remaining.    02/12/2019 Pathology Results   A. OVARY AND FALLOPIAN TUBE, LEFT, SALPINGO-OOPHORECTOMY: - Serous carcinoma, high grade, status post neoadjuvant therapy - See oncology table and comment below B. UTERUS CERVIX WITH RIGHT FALLOPIAN TUBE AND OVARY, HYSTERECTOMY: Uterus: - Serosal surface involved by serous carcinoma - Endomyometrium uninvolved by carcinoma - Benign endometrial polyp (4.1 cm) - Leiomyomata (5.5 cm; largest) - Adenomyosis Cervix: - Uninvolved by carcinoma Left ovary: - Serous carcinoma, high grade Left fallopian tube: - Serous carcinoma, high grade C. SOFT TISSUE, LEFT PELVIC SIDEWALL TUMOR, EXCISION: - Metastatic serous carcinoma, high-grade D. SOFT TISSUE, SIGMOID COLON MESENTERY, EXCISION: - Metastatic serous carcinoma, high-grade E. OMENTUM, TUMOR RESECTION: - Metastatic serous carcinoma, high-grade OVARY or FALLOPIAN TUBE or PRIMARY PERITONEUM: Procedure: Total hysterectomy and bilateral salpingo-oophorectomy, omentectomy and peritoneal biopsies Specimen Integrity: Fragmented Tumor Site: Left ovary and fallopian tube Ovarian Surface Involvement: Present Fallopian Tube Surface Involvement: Present Tumor Size: 6.3 cm (see comment) Histologic Type: Serous carcinoma Histologic Grade: High-grade Implants: Not applicable Other Tissue/ Organ Involvement: Right ovary, right fallopian tube, omentum, mesentery Largest Extrapelvic Peritoneal Focus: Macroscopic Peritoneal/Ascitic Fluid: Malignant Treatment Effect: No definite or minimal response identified (chemotherapy response score 1 [CRS1] Regional Lymph Nodes: No lymph nodes submitted or  found Pathologic Stage Classification (pTNM, AJCC 8th Edition): pT3c, pNX Representative Tumor Block: A4 Comment(s): Additional testing (HER-2, MMR and MSI) are pending. The primary tumor site appears to be the left ovary and fallopian tube. The uterus is only involved on the serosal surface. There is tumor on the anterior peritoneal reflection.  Addendum: Tumor is Her2 negative,MSI stable   02/19/2019 Tumor Marker   Patient's tumor was tested for the following markers: CA-125 Results of the tumor marker test revealed 40.3   04/01/2019 Tumor Marker   Patient's tumor was tested for the following markers: CA-125 Results of the tumor marker test revealed 8.1    Genetic Testing   Negative testing. No pathogenic variants identified on the [employer]. The report date is 04/17/2019.  Somatic genes analyzed through [facility]: ATM, BARD1, BRCA1, BRCA2, BRIP1, CHEK2, MRE11A, NBN, PALB2, RAD51C, RAD51D.  The [employer] gene panel offered by [employer] includes sequencing and rearrangement analysis for the following 36 genes: APC*, ATM*, AXIN2, BARD1, BMPR1A, BRCA1*, BRCA2*, BRIP1*, CDH1*, CDK4, CDKN2A, CHEK2*, DICER1, MLH1*, MSH2*, MSH3, MSH6*, MUTYH*, NBN, NF1*, NTHL1, PALB2*, PMS2*, PTEN*, RAD51C*, RAD51D*, RECQL, SMAD4, SMARCA4, STK11 and TP53* (sequencing and deletion/duplication); HOXB13, POLD1 and POLE (sequencing only); EPCAM and GREM1 (deletion/duplication only).    05/21/2019 Imaging   1. Interval hysterectomy, oophorectomy and omentectomy. No evidence of residual ovarian carcinoma in the pelvis. No evidence of peritoneal disease. No intraperitoneal free fluid. 2. Nonobstructing LEFT renal calculi.  Normal ureters.  05/22/2019 Tumor Marker   Patient's tumor was tested for the following markers: CA-125 Results of the tumor marker test revealed 5.7.   06/03/2019 Procedure   Successful right IJ vein Port-A-Cath explant.   08/26/2019 Tumor Marker   Patient's  tumor was tested for the following markers: CA-125. Results of the tumor marker test revealed 4.9   10/03/2019 Imaging   1. New tumor deposits along the capsular surfaces of the liver, spleen, in the left pelvis, and right paracolic gutter, compatible with metastatic disease. 2. Mild dilation of the dorsal pancreatic duct in the pancreatic body and head, cause uncertain. 3. Nonobstructive left nephrolithiasis. 4. Aortic atherosclerosis.   Aortic Atherosclerosis (ICD10-I70.0   10/10/2019 Procedure   Successful placement of a right IJ approach Power Port with ultrasound and fluoroscopic guidance. The catheter is ready for use.     10/11/2019 Tumor Marker   Patient's tumor was tested for the following markers: CA-125 Results of the tumor marker test revealed 19.6.   10/14/2019 -  Chemotherapy   The patient had carboplatin and gemzar for chemotherapy treatment.     11/04/2019 Tumor Marker   Patient's tumor was tested for the following markers: CA-125 Results of the tumor marker test revealed 11.4   11/28/2019 Tumor Marker   Patient's tumor was tested for the following markers: CA-125 Results of the tumor marker test revealed 5.8   12/20/2019 Imaging   1. Significant interval reduction in mixed solid and cystic nodule in the vicinity of the left ovary, with significant improvement or resolution of multiple pelvic, peritoneal, and organ capsule implants. Resolution of previously seen small volume ascites. Findings are consistent with treatment response of abdominal metastatic disease. 2. Status post hysterectomy, oophorectomy, and omentectomy. 3. Nonobstructive left nephrolithiasis. 4. Aortic Atherosclerosis (ICD10-I70.0).   12/23/2019 Tumor Marker   Patient's tumor was tested for the following markers: CA-125 Results of the tumor marker test revealed 5.0   01/24/2020 Tumor Marker   Patient's tumor was tested for the following markers: CA-125 Results of the tumor marker test revealed 4.8    02/24/2020 Tumor Marker   Patient's tumor was tested for the following markers: CA-125. Results of the tumor marker test revealed 4.3.   03/06/2020 Imaging   1. Status post hysterectomy, bilateral oophorectomy, and omentectomy. 2. Resolution of previously described left adnexal nodularity. 3. No residual disease identified. 4.  Aortic Atherosclerosis (ICD10-I70.0). 5. Left nephrolithiasis.   04/20/2020 Tumor Marker   Patient's tumor was tested for the following markers: CA-125 Results of the tumor marker test revealed 4.6   05/18/2020 Tumor Marker   Patient's tumor was tested for the following markers: CA-125 Results of the tumor marker test revealed 4.7   05/29/2020 Imaging   Status post hysterectomy and bilateral salpingo oophorectomy.   No evidence of recurrent or metastatic disease.   06/08/2020 - 09/13/2021 Chemotherapy   She was on niraparib       06/17/2020 Tumor Marker   Patient's tumor was tested for the following markers: CA-125 Results of the tumor marker test revealed 4.5   08/04/2020 Tumor Marker   Patient's tumor was tested for the following markers: CA-125. Results of the tumor marker test revealed 5.7   09/07/2020 Imaging   1. Status post hysterectomy and bilateral salpingo oophorectomy. No evidence of recurrent or metastatic disease. 2. Nonobstructing left nephrolithiasis. 3. Left-sided colonic diverticulosis without findings of acute diverticulitis. 4. Aortic atherosclerosis.     09/07/2020 Tumor Marker   Patient's tumor was tested for the following markers:  CA-125 Results of the tumor marker test revealed 4.8.   10/21/2020 Tumor Marker   Patient's tumor was tested for the following markers: CA-125. Results of the tumor marker test revealed 5.1.   12/14/2020 Tumor Marker   Patient's tumor was tested for the following markers: CA-125. Results of the tumor marker test revealed 5.5.   04/05/2021 Tumor Marker   Patient's tumor was tested for the following  markers: CA-125. Results of the tumor marker test revealed 5.9.   05/31/2021 Tumor Marker   Patient's tumor was tested for the following markers: CA-125. Results of the tumor marker test revealed 8.1.   09/13/2021 Imaging   1. Interval development of multiple new cystic and solid lesions in the abdomen and pelvis measuring up to 10.3 x 7.2 cm and consistent with peritoneal metastatic disease. 2. Nonobstructing left renal stones. 3. Aortic Atherosclerosis (ICD10-I70.0).     09/14/2021 Tumor Marker   Patient's tumor was tested for the following markers: CA-125. Results of the tumor marker test revealed 28.5.   09/16/2021 Echocardiogram   1. Left ventricular ejection fraction, by estimation, is 60 to 65%. The left ventricle has normal function. The left ventricle has no regional wall motion abnormalities. Left ventricular diastolic parameters are consistent with Grade I diastolic dysfunction (impaired relaxation).  2. Right ventricular systolic function is normal. The right ventricular size is normal.  3. The mitral valve is normal in structure. Trivial mitral valve regurgitation. No evidence of mitral stenosis.  4. The aortic valve has an indeterminant number of cusps. Aortic valve regurgitation is trivial. No aortic stenosis is present.  5. The inferior vena cava is normal in size with greater than 50% respiratory variability, suggesting right atrial pressure of 3 mmHg.     09/20/2021 - 01/17/2022 Chemotherapy   Patient is on Treatment Plan : OVARIAN RECURRENT Liposomal Doxorubicin + Carboplatin q28d X 6 Cycles     09/20/2021 - 02/22/2022 Chemotherapy   Patient is on Treatment Plan : OVARIAN RECURRENT Liposomal Doxorubicin + Carboplatin q28d X 6 Cycles     09/23/2021 Tumor Marker   Patient's tumor was tested for the following markers: CA-125. Results of the tumor marker test revealed 27.7.   11/24/2021 Tumor Marker   Patient's tumor was tested for the following markers: CA-125. Results of the  tumor marker test revealed 28.4.   12/13/2021 Imaging   1. Overall stable peritoneal carcinomatosis. No new disease is demonstrated. 2. Moderate left-sided hydroureteronephrosis likely due to compression of the left ureter by the left pelvic mass. 3. Stable left-sided renal calculi. 4. Numerous left renal calculi.     12/22/2021 Tumor Marker   Patient's tumor was tested for the following markers: CA-125. Results of the tumor marker test revealed 24.7.   01/18/2022 Tumor Marker   Patient's tumor was tested for the following markers: CA-125. Results of the tumor marker test revealed 9.9.   01/27/2022 Echocardiogram    1. Left ventricular ejection fraction, by estimation, is 60 to 65%. The left ventricle has normal function. The left ventricle has no regional wall motion abnormalities. Left ventricular diastolic parameters are consistent with Grade I diastolic dysfunction (impaired relaxation). The average left ventricular global longitudinal strain is -21.1 %. The global longitudinal strain is normal.  2. Right ventricular systolic function is normal. The right ventricular size is normal.  3. The mitral valve is abnormal. Mild mitral valve regurgitation.  4. The aortic valve is tricuspid. Aortic valve regurgitation is trivial.  5. The inferior vena cava is normal in  size with greater than 50% respiratory variability, suggesting right atrial pressure of 3 mmHg.   02/23/2022 Tumor Marker   Patient's tumor was tested for the following markers: CA-125. Results of the tumor marker test revealed 8.5.   03/18/2022 Imaging   1. Diminished size of nodular and mixed solid and cystic peritoneal implants in the hepatorenal fossa, right lower quadrant, and left pelvis. 2. Unchanged solid mass in the right pelvis measuring 4.0 x 3.7 cm. 3. Constellation of findings is overall consistent with treatment response 4. Status post hysterectomy, oophorectomy, and omentectomy. 5. Nonobstructive left  nephrolithiasis.   Aortic Atherosclerosis (ICD10-I70.0).     03/22/2022 - 08/16/2022 Chemotherapy   Patient is on Treatment Plan : Ovarian Bevacizumab q21d     03/24/2022 Tumor Marker   Patient's tumor was tested for the following markers: CA-125. Results of the tumor marker test revealed 7.1.   04/28/2022 Tumor Marker   Patient's tumor was tested for the following markers: CA-125. Results of the tumor marker test revealed 7.0.   05/27/2022 Tumor Marker   Patient's tumor was tested for the following markers: CA-125. Results of the tumor marker test revealed 7.7.   06/14/2022 Imaging   1. Slight response to therapy as evidenced by decrease in size of a cystic and solid mass in the left adnexa as well as peritoneal implants in the right paracolic gutter. Solid right adnexal mass and additional peritoneal implants along right hepatic lobe and in the left anatomic pelvis appear stable. 2. Hepatic steatosis. 3. Left renal stones. 4.  Aortic atherosclerosis (ICD10-I70.0).   06/17/2022 Tumor Marker   Patient's tumor was tested for the following markers: CA-125. Results of the tumor marker test revealed 11.2.   09/05/2022 Imaging   CT ABDOMEN PELVIS W CONTRAST  Result Date: 09/06/2022 CLINICAL DATA:  Ovarian cancer.  Restaging.  * Tracking Code: BO * EXAM: CT ABDOMEN AND PELVIS WITH CONTRAST TECHNIQUE: Multidetector CT imaging of the abdomen and pelvis was performed using the standard protocol following bolus administration of intravenous contrast. RADIATION DOSE REDUCTION: This exam was performed according to the departmental dose-optimization program which includes automated exposure control, adjustment of the mA and/or kV according to patient size and/or use of iterative reconstruction technique. CONTRAST:  OMNIPAQUE IOHEXOL 300 MG/ML  SOLN COMPARISON:  06/13/2022 FINDINGS: Lower chest: Unremarkable. Hepatobiliary: No suspicious focal abnormality within the liver parenchyma. There is no  evidence for gallstones, gallbladder wall thickening, or pericholecystic fluid. No intrahepatic or extrahepatic biliary dilation. Pancreas: No pancreatic mass lesion evident. Diffuse prominence of the main pancreatic duct is stable measuring up to 3-4 mm diameter in the head of the pancreas. Spleen: No splenomegaly. No focal mass lesion. Adrenals/Urinary Tract: No adrenal nodule or mass. Right kidney unremarkable. A cluster of nonobstructing stones in the upper pole right kidney is again noted with dominant stone measuring about 6 mm in size. No evidence for hydroureter. The urinary bladder appears normal for the degree of distention. Stomach/Bowel: Tiny hiatal hernia. Stomach otherwise unremarkable. Duodenum is normally positioned as is the ligament of Treitz. No small bowel wall thickening. No small bowel dilatation. The terminal ileum is normal. The appendix is normal. No gross colonic mass. No colonic wall thickening. Diverticular changes are noted in the left colon without evidence of diverticulitis. Vascular/Lymphatic: There is mild atherosclerotic calcification of the abdominal aorta without aneurysm. There is no gastrohepatic or hepatoduodenal ligament lymphadenopathy. No retroperitoneal or mesenteric lymphadenopathy. No pelvic sidewall lymphadenopathy. Reproductive: Right-sided adnexal mass measured previously at 4.2 x  3.8 cm is now 4.9 x 4.6 cm. Cystic and solid left adnexal lesion measured previously at 4.1 x 3.4 cm is now 3.2 x 2.9 cm. 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 The more anterior of the 2 index lesions measures 2.6 x 2.1 cm today compared to 2.4 x 1.6 cm previously. The more posterior lesion is 2.4 x 1.4 cm today compared to 2.0 x 1.9 cm previously. 5 mm soft tissue nodule identified in the inferior paracolic gutter previously at 5 mm measures 10 mm today on image 51/2. Implants along the left posterior peritoneum adjacent to the psoas muscle and iliac vessels  appear progressive. 1.6 x 1.3 cm lesion visible on 58/2 was 1.1 x 0.8 cm previously (remeasured). No evidence for ascites. Musculoskeletal: No worrisome lytic or sclerotic osseous abnormality. IMPRESSION: 1. Overall generalized appearance of mild disease progression. 2. Index right-sided adnexal mass has progressed since the prior with interval decrease in size of the cystic and solid left adnexal lesion. 3. Interval progression of index capsular implants along the medial right liver. 4. Interval progression of soft tissue nodule in the inferior right paracolic gutter and peritoneal implants in the lower left abdomen and pelvis. 5. No evidence for ascites. 6. Nonobstructing left renal stones. 7. Tiny hiatal hernia. 8. Left colonic diverticulosis without diverticulitis. 9.  Aortic Atherosclerosis (ICD10-I70.0). Electronically Signed   By: Kennith Center M.D.   On: 09/06/2022 07:57      09/15/2022 -  Chemotherapy   Patient is on Treatment Plan : OVARIAN Carboplatin (AUC 6) + Paclitaxel (175) q21d X 6 Cycles       PHYSICAL EXAMINATION: ECOG PERFORMANCE STATUS: 1 - Symptomatic but completely ambulatory  Vitals:   10/27/22 0854  BP: (!) 143/73  Pulse: 93  Resp: 18  Temp: 98.2 F (36.8 C)  SpO2: 100%   Filed Weights   10/27/22 0854  Weight: 141 lb 12.8 oz (64.3 kg)    GENERAL:alert, no distress and comfortable  NEURO: alert & oriented x 3 with fluent speech, no focal motor/sensory deficits  LABORATORY DATA:  I have reviewed the data as listed    Component Value Date/Time   NA 139 10/06/2022 0917   K 3.9 10/06/2022 0917   CL 105 10/06/2022 0917   CO2 26 10/06/2022 0917   GLUCOSE 223 (H) 10/06/2022 0917   BUN 14 10/06/2022 0917   CREATININE 0.62 10/06/2022 0917   CALCIUM 9.5 10/06/2022 0917   PROT 6.9 10/06/2022 0917   ALBUMIN 4.3 10/06/2022 0917   AST 53 (H) 10/06/2022 0917   ALT 50 (H) 10/06/2022 0917   ALKPHOS 83 10/06/2022 0917   BILITOT 0.7 10/06/2022 0917   GFRNONAA >60  10/06/2022 0917   GFRAA >60 02/24/2020 0852    No results found for: "SPEP", "UPEP"  Lab Results  Component Value Date   WBC 6.3 10/27/2022   NEUTROABS 5.4 10/27/2022   HGB 11.6 (L) 10/27/2022   HCT 33.6 (L) 10/27/2022   MCV 94.9 10/27/2022   PLT 101 (L) 10/27/2022      Chemistry      Component Value Date/Time   NA 139 10/06/2022 0917   K 3.9 10/06/2022 0917   CL 105 10/06/2022 0917   CO2 26 10/06/2022 0917   BUN 14 10/06/2022 0917   CREATININE 0.62 10/06/2022 0917      Component Value Date/Time   CALCIUM 9.5 10/06/2022 0917   ALKPHOS 83 10/06/2022 0917   AST 53 (H) 10/06/2022 1610  ALT 50 (H) 10/06/2022 0917   BILITOT 0.7 10/06/2022 1191

## 2022-10-28 ENCOUNTER — Other Ambulatory Visit: Payer: Self-pay

## 2022-10-28 ENCOUNTER — Telehealth: Payer: Self-pay | Admitting: Hematology and Oncology

## 2022-10-28 NOTE — Telephone Encounter (Signed)
Patient is aware of upcoming appointment dates/times.  

## 2022-10-29 ENCOUNTER — Other Ambulatory Visit: Payer: Self-pay

## 2022-11-14 ENCOUNTER — Encounter (HOSPITAL_COMMUNITY): Payer: Self-pay

## 2022-11-14 ENCOUNTER — Other Ambulatory Visit: Payer: Medicare Other

## 2022-11-14 ENCOUNTER — Inpatient Hospital Stay: Payer: Medicare Other

## 2022-11-14 ENCOUNTER — Other Ambulatory Visit (HOSPITAL_COMMUNITY): Payer: Medicare Other

## 2022-11-14 ENCOUNTER — Ambulatory Visit (HOSPITAL_COMMUNITY)
Admission: RE | Admit: 2022-11-14 | Discharge: 2022-11-14 | Disposition: A | Discharge status: Home / Self Care | Payer: Medicare Other | Source: Ambulatory Visit | Attending: Hematology and Oncology | Admitting: Hematology and Oncology

## 2022-11-14 DIAGNOSIS — C562 Malignant neoplasm of left ovary: Secondary | ICD-10-CM | POA: Insufficient documentation

## 2022-11-14 DIAGNOSIS — C786 Secondary malignant neoplasm of retroperitoneum and peritoneum: Secondary | ICD-10-CM

## 2022-11-14 DIAGNOSIS — Z7189 Other specified counseling: Secondary | ICD-10-CM

## 2022-11-14 DIAGNOSIS — Z5111 Encounter for antineoplastic chemotherapy: Secondary | ICD-10-CM | POA: Diagnosis not present

## 2022-11-14 LAB — CMP (CANCER CENTER ONLY)
ALT: 40 U/L (ref 0–44)
AST: 51 U/L — ABNORMAL HIGH (ref 15–41)
Albumin: 3.8 g/dL (ref 3.5–5.0)
Alkaline Phosphatase: 99 U/L (ref 38–126)
Anion gap: 7 (ref 5–15)
BUN: 12 mg/dL (ref 8–23)
CO2: 26 mmol/L (ref 22–32)
Calcium: 9.2 mg/dL (ref 8.9–10.3)
Chloride: 105 mmol/L (ref 98–111)
Creatinine: 0.52 mg/dL (ref 0.44–1.00)
GFR, Estimated: >60 mL/min (ref >60)
Glucose, Bld: 96 mg/dL (ref 70–99)
Potassium: 3.9 mmol/L (ref 3.5–5.1)
Sodium: 138 mmol/L (ref 135–145)
Total Bilirubin: 0.6 mg/dL (ref 0.3–1.2)
Total Protein: 6 g/dL — ABNORMAL LOW (ref 6.5–8.1)

## 2022-11-14 LAB — CBC WITH DIFFERENTIAL (CANCER CENTER ONLY)
Abs Immature Granulocytes: 0.01 10*3/uL (ref 0.00–0.07)
Basophils Absolute: 0 10*3/uL (ref 0.0–0.1)
Basophils Relative: 0 %
Eosinophils Absolute: 0 10*3/uL (ref 0.0–0.5)
Eosinophils Relative: 1 %
HCT: 30.1 % — ABNORMAL LOW (ref 36.0–46.0)
Hemoglobin: 10.5 g/dL — ABNORMAL LOW (ref 12.0–15.0)
Immature Granulocytes: 0 %
Lymphocytes Relative: 46 %
Lymphs Abs: 1.6 10*3/uL (ref 0.7–4.0)
MCH: 34 pg (ref 26.0–34.0)
MCHC: 34.9 g/dL (ref 30.0–36.0)
MCV: 97.4 fL (ref 80.0–100.0)
Monocytes Absolute: 0.6 10*3/uL (ref 0.1–1.0)
Monocytes Relative: 17 %
Neutro Abs: 1.2 10*3/uL — ABNORMAL LOW (ref 1.7–7.7)
Neutrophils Relative %: 36 %
Platelet Count: 105 10*3/uL — ABNORMAL LOW (ref 150–400)
RBC: 3.09 MIL/uL — ABNORMAL LOW (ref 3.87–5.11)
RDW: 15.3 % (ref 11.5–15.5)
WBC Count: 3.4 10*3/uL — ABNORMAL LOW (ref 4.0–10.5)
nRBC: 0 % (ref 0.0–0.2)

## 2022-11-14 MED ORDER — HEPARIN SOD (PORK) LOCK FLUSH 100 UNIT/ML IV SOLN
500.0000 [IU] | Freq: Once | INTRAVENOUS | Status: AC
  Administered 2022-11-14: 500 [IU] via INTRAVENOUS

## 2022-11-14 MED ORDER — SODIUM CHLORIDE 0.9% FLUSH
10.0000 mL | Freq: Once | INTRAVENOUS | Status: AC
  Administered 2022-11-14: 10 mL

## 2022-11-14 MED ORDER — IOHEXOL 9 MG/ML PO SOLN
1000.0000 mL | ORAL | Status: AC
  Administered 2022-11-14: 1000 mL via ORAL

## 2022-11-14 MED ORDER — IOHEXOL 9 MG/ML PO SOLN
ORAL | Status: AC
  Filled 2022-11-14: qty 1000

## 2022-11-14 MED ORDER — SODIUM CHLORIDE (PF) 0.9 % IJ SOLN
INTRAMUSCULAR | Status: AC
  Filled 2022-11-14: qty 50

## 2022-11-14 MED ORDER — IOHEXOL 300 MG/ML  SOLN
100.0000 mL | Freq: Once | INTRAMUSCULAR | Status: AC | PRN
  Administered 2022-11-14: 100 mL via INTRAVENOUS

## 2022-11-14 MED ORDER — HEPARIN SOD (PORK) LOCK FLUSH 100 UNIT/ML IV SOLN
INTRAVENOUS | Status: AC
  Filled 2022-11-14: qty 5

## 2022-11-16 MED FILL — Dexamethasone Sodium Phosphate Inj 100 MG/10ML: INTRAMUSCULAR | Qty: 1 | Status: AC

## 2022-11-16 MED FILL — Fosaprepitant Dimeglumine For IV Infusion 150 MG (Base Eq): INTRAVENOUS | Qty: 5 | Status: AC

## 2022-11-17 ENCOUNTER — Inpatient Hospital Stay: Payer: Medicare Other

## 2022-11-17 ENCOUNTER — Inpatient Hospital Stay: Payer: Medicare Other | Admitting: Licensed Clinical Social Worker

## 2022-11-17 ENCOUNTER — Other Ambulatory Visit: Payer: Self-pay

## 2022-11-17 ENCOUNTER — Encounter: Payer: Self-pay | Admitting: Oncology

## 2022-11-17 ENCOUNTER — Encounter: Payer: Self-pay | Admitting: Hematology and Oncology

## 2022-11-17 ENCOUNTER — Inpatient Hospital Stay (HOSPITAL_BASED_OUTPATIENT_CLINIC_OR_DEPARTMENT_OTHER): Payer: Medicare Other | Admitting: Hematology and Oncology

## 2022-11-17 VITALS — BP 143/73 | HR 92 | Temp 97.4°F | Resp 18 | Ht 62.0 in | Wt 138.2 lb

## 2022-11-17 VITALS — BP 136/88 | HR 79 | Resp 18

## 2022-11-17 DIAGNOSIS — C562 Malignant neoplasm of left ovary: Secondary | ICD-10-CM

## 2022-11-17 DIAGNOSIS — Z7189 Other specified counseling: Secondary | ICD-10-CM

## 2022-11-17 DIAGNOSIS — Z5111 Encounter for antineoplastic chemotherapy: Secondary | ICD-10-CM | POA: Diagnosis not present

## 2022-11-17 DIAGNOSIS — D61818 Other pancytopenia: Secondary | ICD-10-CM

## 2022-11-17 DIAGNOSIS — G620 Drug-induced polyneuropathy: Secondary | ICD-10-CM | POA: Diagnosis not present

## 2022-11-17 DIAGNOSIS — T451X5A Adverse effect of antineoplastic and immunosuppressive drugs, initial encounter: Secondary | ICD-10-CM

## 2022-11-17 MED ORDER — SODIUM CHLORIDE 0.9 % IV SOLN
10.0000 mg | Freq: Once | INTRAVENOUS | Status: AC
  Administered 2022-11-17: 10 mg via INTRAVENOUS
  Filled 2022-11-17: qty 10

## 2022-11-17 MED ORDER — SODIUM CHLORIDE 0.9 % IV SOLN
470.0000 mg | Freq: Once | INTRAVENOUS | Status: AC
  Administered 2022-11-17: 470 mg via INTRAVENOUS
  Filled 2022-11-17: qty 47

## 2022-11-17 MED ORDER — CETIRIZINE HCL 10 MG/ML IV SOLN
10.0000 mg | Freq: Once | INTRAVENOUS | Status: AC
  Administered 2022-11-17: 10 mg via INTRAVENOUS
  Filled 2022-11-17: qty 1

## 2022-11-17 MED ORDER — PALONOSETRON HCL INJECTION 0.25 MG/5ML
0.2500 mg | Freq: Once | INTRAVENOUS | Status: AC
  Administered 2022-11-17: 0.25 mg via INTRAVENOUS
  Filled 2022-11-17: qty 5

## 2022-11-17 MED ORDER — HEPARIN SOD (PORK) LOCK FLUSH 100 UNIT/ML IV SOLN
500.0000 [IU] | Freq: Once | INTRAVENOUS | Status: AC | PRN
  Administered 2022-11-17: 500 [IU]

## 2022-11-17 MED ORDER — SODIUM CHLORIDE 0.9 % IV SOLN: Freq: Once | INTRAVENOUS | Status: AC

## 2022-11-17 MED ORDER — FAMOTIDINE IN NACL 20-0.9 MG/50ML-% IV SOLN
20.0000 mg | Freq: Once | INTRAVENOUS | Status: AC
  Administered 2022-11-17: 20 mg via INTRAVENOUS
  Filled 2022-11-17: qty 50

## 2022-11-17 MED ORDER — SODIUM CHLORIDE 0.9 % IV SOLN
150.0000 mg | Freq: Once | INTRAVENOUS | Status: AC
  Administered 2022-11-17: 150 mg via INTRAVENOUS
  Filled 2022-11-17: qty 150

## 2022-11-17 MED ORDER — SODIUM CHLORIDE 0.9% FLUSH
10.0000 mL | INTRAVENOUS | Status: DC | PRN
  Administered 2022-11-17: 10 mL

## 2022-11-17 MED ORDER — SODIUM CHLORIDE 0.9 % IV SOLN
105.0000 mg/m2 | Freq: Once | INTRAVENOUS | Status: AC
  Administered 2022-11-17: 174 mg via INTRAVENOUS
  Filled 2022-11-17: qty 29

## 2022-11-17 NOTE — Patient Instructions (Signed)
[facility]  Discharge Instructions: Thank you for choosing [facility] to provide your oncology and hematology care.   If you have a lab appointment with the Cancer Center, please go directly to the Cancer Center and check in at the registration area.   Wear comfortable clothing and clothing appropriate for easy access to any Portacath or PICC line.   We strive to give you quality time with your provider. You may need to reschedule your appointment if you arrive late (15 or more minutes).  Arriving late affects you and other patients whose appointments are after yours.  Also, if you miss three or more appointments without notifying the office, you may be dismissed from the clinic at the provider's discretion.      For prescription refill requests, have your pharmacy contact our office and allow 72 hours for refills to be completed.    Today you received the following chemotherapy and/or immunotherapy agents - Taxol/Carboplatin      To help prevent nausea and vomiting after your treatment, we encourage you to take your nausea medication as directed.  BELOW ARE SYMPTOMS THAT SHOULD BE REPORTED IMMEDIATELY: *FEVER GREATER THAN 100.4 F (38 C) OR HIGHER *CHILLS OR SWEATING *NAUSEA AND VOMITING THAT IS NOT CONTROLLED WITH YOUR NAUSEA MEDICATION *UNUSUAL SHORTNESS OF BREATH *UNUSUAL BRUISING OR BLEEDING *URINARY PROBLEMS (pain or burning when urinating, or frequent urination) *BOWEL PROBLEMS (unusual diarrhea, constipation, pain near the anus) TENDERNESS IN MOUTH AND THROAT WITH OR WITHOUT PRESENCE OF ULCERS (sore throat, sores in mouth, or a toothache) UNUSUAL RASH, SWELLING OR PAIN  UNUSUAL VAGINAL DISCHARGE OR ITCHING   Items with * indicate a potential emergency and should be followed up as soon as possible or go to the Emergency Department if any problems should occur.  Please show the CHEMOTHERAPY ALERT CARD or IMMUNOTHERAPY ALERT CARD  at check-in to the Emergency Department and triage nurse.  Should you have questions after your visit or need to cancel or reschedule your appointment, please contact [facility]  Dept: [phone]  and follow the prompts.  Office hours are 8:00 a.m. to 4:30 p.m. Monday - Friday. Please note that voicemails left after 4:00 p.m. may not be returned until the following business day.  We are closed weekends and major holidays. You have access to a nurse at all times for urgent questions. Please call the main number to the clinic Dept: [phone] and follow the prompts.   For any non-urgent questions, you may also contact your provider using MyChart. We now offer e-Visits for anyone 18 and older to request care online for non-urgent symptoms. For details visit [email or web address].   Also download the MyChart app! Go to the app store, search "MyChart", open the app, select [facility], and log in with your MyChart username and password.  

## 2022-11-17 NOTE — Progress Notes (Signed)
Approximately halfway through Carboplatin infusion Pt had c/o reflux and appeared slightly diaphoretic. This RN slowed the rate of the Carboplatin infusion and let Dr. [name] know. After infusion rate was slowed Pt no longer appeared diaphoretic and she stated "I am feeling better". Dr. [name] assessed Pt chairside. MD recommended Pt to eat prior and during infusion next time. Per Dr. [name] we want to rule out outside factors before labeling it as a reaction. Pt was agreeable Dr. Maxine Glenn recommendations and verbalized understanding.

## 2022-11-17 NOTE — Assessment & Plan Note (Signed)
Overall, she tolerated treatment well except for progressive pancytopenia and slight neuropathy She had excellent response to therapy I will review her molecular testing again and make sure HER2/neu test has been ordered for future reference I have reviewed recommendation from [facility] The plan will be to proceed for few more cycles of treatment and with repeat imaging study in September before transitioning her out to maintenance treatment I will reduce the dose of chemotherapy of paclitaxel little bit due to neuropathy and pancytopenia

## 2022-11-17 NOTE — Progress Notes (Signed)
[facility] CSW Progress Note  Visual merchandiser met with patient to follow-up on coping. Pt is doing well today and is optimistic as she received good news from her recent scans and is hopeful to go on maintenance after the next three treatments. She continues to have strong support from family and friends and has been increasing her activity (up to walking 1.5 miles).   CSW reviewed options for support while this CSW is on leave. Pt will ask RN for connection with support team if needed, but otherwise is opting to wait until this CSW returns.    Stacy Mccarry E Dyland Panuco, LCSW Clinical Social Worker [facility]

## 2022-11-17 NOTE — Progress Notes (Signed)
Requested Her2 testing on accession [id number] with [facility] via email.

## 2022-11-17 NOTE — Assessment & Plan Note (Signed)
she has mild peripheral neuropathy, likely related to side effects of treatment. °I plan to reduce the dose of treatment as outlined above.  °I explained to the patient the rationale of this strategy and reassured the patient it would not compromise the efficacy of treatment ° °

## 2022-11-17 NOTE — Progress Notes (Signed)
[facility] OFFICE PROGRESS NOTE  Patient Care Team: Geoffry Paradise, MD as PCP - General (Internal Medicine) Leanora Ivanoff, RN as Registered Nurse  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HCC) Overall, she tolerated treatment well except for progressive pancytopenia and slight neuropathy She had excellent response to therapy I will review her molecular testing again and make sure HER2/neu test has been ordered for future reference I have reviewed recommendation from [facility] The plan will be to proceed for few more cycles of treatment and with repeat imaging study in September before transitioning her out to maintenance treatment I will reduce the dose of chemotherapy of paclitaxel little bit due to neuropathy and pancytopenia  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 I plan to reduce the dose of treatment as outlined above.  I explained to the patient the rationale of this strategy and reassured the patient it would not compromise the efficacy of treatment   Pancytopenia, acquired [facility]) She is not symptomatic Plan to reduce the dose a little bit We will proceed with treatment without delay  Orders Placed This Encounter  Procedures   CBC with Differential (Cancer Center Only)    Standing Status:   Future    Standing Expiration Date:   01/19/2024   CMP (Cancer Center only)    Standing Status:   Future    Standing Expiration Date:   01/19/2024    All questions were answered. The patient knows to call the clinic with any problems, questions or concerns. The total time spent in the appointment was 40 minutes encounter with patients including review of chart and various tests results, discussions about plan of care and coordination of care plan   Artis Delay, MD 11/17/2022 9:45 AM  INTERVAL HISTORY: Please see below for problem oriented charting. she returns for  treatment follow-up seen prior to cycle 4 of treatment We reviewed imaging study results She has neuropathy from treatment I also reviewed recommendation from [facility] and we will order additional test on her prior tissue She denies excessive fatigue.  No recent infection  REVIEW OF SYSTEMS:   Constitutional: Denies fevers, chills or abnormal weight loss Eyes: Denies blurriness of vision Ears, nose, mouth, throat, and face: Denies mucositis or sore throat Respiratory: Denies cough, dyspnea or wheezes Cardiovascular: Denies palpitation, chest discomfort or lower extremity swelling Gastrointestinal:  Denies nausea, heartburn or change in bowel habits Skin: Denies abnormal skin rashes Lymphatics: Denies new lymphadenopathy or easy bruising Neurological:Denies numbness, tingling or new weaknesses Behavioral/Psych: Mood is stable, no new changes  All other systems were reviewed with the patient and are negative.  I have reviewed the past medical history, past surgical history, social history and family history with the patient and they are unchanged from previous note.  ALLERGIES:  is allergic to skin adhesives [cyanoacrylate], shrimp [shellfish allergy], and sulfonamide derivatives.  MEDICATIONS:  Current Outpatient Medications  Medication Sig Dispense Refill   ALPRAZolam (XANAX) 0.25 MG tablet Take 1 tablet by mouth at bedtime as needed for anxiety. 30 tablet 0   carboxymethylcellulose (REFRESH PLUS) 0.5 % SOLN Place 1 drop into both eyes 2 (two) times daily as needed (dry eyes).     Cholecalciferol (VITAMIN D3) 25 MCG (1000 UT) CAPS Take 1,000 Units by mouth daily.      dexamethasone (DECADRON) 4 MG tablet Take 2 tabs at the night before and 2 tab the morning of chemotherapy, every 3  weeks, by mouth x 6 cycles 24 tablet 6   esomeprazole (NEXIUM) 20 MG capsule Take 20 mg by mouth daily.      estradiol (ESTRACE VAGINAL) 0.1 MG/GM vaginal cream Place 1 applicatorful vaginally 3 (three) times a  week. 42.5 g 12   lidocaine-prilocaine (EMLA) cream Apply topically as needed. 30 g 0   lisinopril (ZESTRIL) 20 MG tablet Take 1 tablet (20 mg total) by mouth daily. 90 tablet 1   loratadine (CLARITIN) 10 MG tablet Take 10 mg by mouth daily as needed for allergies.     Multiple Vitamin (MULTIVITAMIN WITH MINERALS) TABS tablet Take 1 tablet by mouth daily.     ondansetron (ZOFRAN) 8 MG tablet Take 1 tablet by mouth 2 times daily as needed. Start on the third day after chemotherapy. 30 tablet 1   prochlorperazine (COMPAZINE) 10 MG tablet Take 1 tablet by mouth every 6 hours as needed (Nausea or vomiting). 30 tablet 1   No current facility-administered medications for this visit.    SUMMARY OF ONCOLOGIC HISTORY: Oncology History Overview Note  Her 2 negative, MSI stable, serous, 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 14.7 cm poorly defined soft tissue mass in central pelvis suspicious for primary ovarian carcinoma, with uterine leiomyosarcoma considered a less likely differential diagnosis. 2. Moderate ascites and diffuse peritoneal carcinomatosis. 3. Several small uterine fibroids.   11/13/2018 Tumor Marker   Patient's tumor was tested for the following markers: CA-125 Results of the tumor marker test revealed 1434   11/21/2018 Procedure   Successful ultrasound-guided diagnostic and therapeutic paracentesis yielding 3.1 liters of peritoneal fluid   11/21/2018 Pathology Results   PERITONEAL/ASCITIC FLUID(SPECIMEN 1 OF 1 COLLECTED 11/21/18): ADENOCARCINOMA. Specimen Clinical Information Pelvic mass suspicious for ovarian cancer Source Peritoneal/Ascitic Fluid, (specimen 1 of 1 collected 11/21/18) Gross Specimen: Received is/are 1000 ccs of dark amber fluid. (CM:cm) Prepared: # Smears: 0 # Concentration Technique Slides (i.e. ThinPrep): 1 # Cell Block:  1 Additional Studies: n/a Comment Comment: The cytologic features are most consistent with serous carcinoma.   11/29/2018 Initial Diagnosis   Ovarian cancer (HCC)   11/29/2018 Cancer Staging   Staging form: Ovary, Fallopian Tube, and Primary Peritoneal Carcinoma, AJCC 8th Edition - Clinical: cT3, cN0, cM0 - Signed by Artis Delay, MD on 11/29/2018   12/03/2018 Procedure   Successful ultrasound-guided therapeutic paracentesis yielding 3.7 liters of peritoneal fluid.     12/06/2018 Procedure   Placement of a subcutaneous port device. Catheter tip at the SVC and right atrium junction   12/14/2018 Procedure   Successful ultrasound-guided paracentesis yielding 2.9 L of peritoneal fluid   12/28/2018 Tumor Marker   Patient's tumor was tested for the following markers: CA-125 Results of the tumor marker test revealed 812.   12/28/2018 - 04/22/2019 Chemotherapy   The patient had carboplatin and taxol for neoadjuvant treatment, followed by interval debulking surgery and subsequent adjuvant chemotherapy treatment.     02/01/2019 Imaging   Ct abdomen and pelvis 8.6 cm left ovarian mass, corresponding to the patient's known primary neoplasm, improved.   Mild peritoneal nodularity/omental caking, improved.   Small abdominopelvic ascites, improved.     02/01/2019 Tumor Marker   Patient's tumor was tested for the following markers: CA-125 Results of the tumor marker test revealed 183.   02/12/2019 Surgery   Preoperative Diagnosis: Stage IIIC ovarian cancer, s/p neoadjuvant chemotherapy  Procedure(s) Performed: 1. Exploratory laparotomy with total abdominal hysterectomy, bilateral salpingo-oophorectomy, omentectomy radical tumor debulking for ovarian cancer.   Surgeon: Luisa Dago, MD.    Operative Findings: upper abdomen free of disease. No visible omental disease. Small volume (200cc) ascites. 8cm friable mass replacing left ovary and adherent to the sigmoid colon mesentery and ureter on the  left.  Anterior fibroid. This represented an optimal cytoreduction (R0) with no gross visible disease remaining.    02/12/2019 Pathology Results   A. OVARY AND FALLOPIAN TUBE, LEFT, SALPINGO-OOPHORECTOMY: - Serous carcinoma, high grade, status post neoadjuvant therapy - See oncology table and comment below B. UTERUS CERVIX WITH RIGHT FALLOPIAN TUBE AND OVARY, HYSTERECTOMY: Uterus: - Serosal surface involved by serous carcinoma - Endomyometrium uninvolved by carcinoma - Benign endometrial polyp (4.1 cm) - Leiomyomata (5.5 cm; largest) - Adenomyosis Cervix: - Uninvolved by carcinoma Left ovary: - Serous carcinoma, high grade Left fallopian tube: - Serous carcinoma, high grade C. SOFT TISSUE, LEFT PELVIC SIDEWALL TUMOR, EXCISION: - Metastatic serous carcinoma, high-grade D. SOFT TISSUE, SIGMOID COLON MESENTERY, EXCISION: - Metastatic serous carcinoma, high-grade E. OMENTUM, TUMOR RESECTION: - Metastatic serous carcinoma, high-grade OVARY or FALLOPIAN TUBE or PRIMARY PERITONEUM: Procedure: Total hysterectomy and bilateral salpingo-oophorectomy, omentectomy and peritoneal biopsies Specimen Integrity: Fragmented Tumor Site: Left ovary and fallopian tube Ovarian Surface Involvement: Present Fallopian Tube Surface Involvement: Present Tumor Size: 6.3 cm (see comment) Histologic Type: Serous carcinoma Histologic Grade: High-grade Implants: Not applicable Other Tissue/ Organ Involvement: Right ovary, right fallopian tube, omentum, mesentery Largest Extrapelvic Peritoneal Focus: Macroscopic Peritoneal/Ascitic Fluid: Malignant Treatment Effect: No definite or minimal response identified (chemotherapy response score 1 [CRS1] Regional Lymph Nodes: No lymph nodes submitted or found Pathologic Stage Classification (pTNM, AJCC 8th Edition): pT3c, pNX Representative Tumor Block: A4 Comment(s): Additional testing (HER-2, MMR and MSI) are pending. The primary tumor site appears to be the left  ovary and fallopian tube. The uterus is only involved on the serosal surface. There is tumor on the anterior peritoneal reflection.  Addendum: Tumor is Her2 negative,MSI stable   02/19/2019 Tumor Marker   Patient's tumor was tested for the following markers: CA-125 Results of the tumor marker test revealed 40.3   04/01/2019 Tumor Marker   Patient's tumor was tested for the following markers: CA-125 Results of the tumor marker test revealed 8.1    Genetic Testing   Negative testing. No pathogenic variants identified on the [employer]. The report date is 04/17/2019.  Somatic genes analyzed through [facility]: ATM, BARD1, BRCA1, BRCA2, BRIP1, CHEK2, MRE11A, NBN, PALB2, RAD51C, RAD51D.  The [facility] gene panel offered by [employer] includes sequencing and rearrangement analysis for the following 36 genes: APC*, ATM*, AXIN2, BARD1, BMPR1A, BRCA1*, BRCA2*, BRIP1*, CDH1*, CDK4, CDKN2A, CHEK2*, DICER1, MLH1*, MSH2*, MSH3, MSH6*, MUTYH*, NBN, NF1*, NTHL1, PALB2*, PMS2*, PTEN*, RAD51C*, RAD51D*, RECQL, SMAD4, SMARCA4, STK11 and TP53* (sequencing and deletion/duplication); HOXB13, POLD1 and POLE (sequencing only); EPCAM and GREM1 (deletion/duplication only).    05/21/2019 Imaging   1. Interval hysterectomy, oophorectomy and omentectomy. No evidence of residual ovarian carcinoma in the pelvis. No evidence of peritoneal disease. No intraperitoneal free fluid. 2. Nonobstructing LEFT renal calculi.  Normal ureters.   05/22/2019 Tumor Marker   Patient's tumor was tested for the following markers: CA-125 Results of the tumor marker test revealed 5.7.   06/03/2019 Procedure   Successful right IJ vein Port-A-Cath explant.   08/26/2019 Tumor Marker   Patient's tumor was tested for the following markers: CA-125. Results of the tumor marker  test revealed 4.9   10/03/2019 Imaging   1. New tumor deposits along the capsular surfaces of the liver, spleen, in the left pelvis, and  right paracolic gutter, compatible with metastatic disease. 2. Mild dilation of the dorsal pancreatic duct in the pancreatic body and head, cause uncertain. 3. Nonobstructive left nephrolithiasis. 4. Aortic atherosclerosis.   Aortic Atherosclerosis (ICD10-I70.0   10/10/2019 Procedure   Successful placement of a right IJ approach Power Port with ultrasound and fluoroscopic guidance. The catheter is ready for use.     10/11/2019 Tumor Marker   Patient's tumor was tested for the following markers: CA-125 Results of the tumor marker test revealed 19.6.   10/14/2019 -  Chemotherapy   The patient had carboplatin and gemzar for chemotherapy treatment.     11/04/2019 Tumor Marker   Patient's tumor was tested for the following markers: CA-125 Results of the tumor marker test revealed 11.4   11/28/2019 Tumor Marker   Patient's tumor was tested for the following markers: CA-125 Results of the tumor marker test revealed 5.8   12/20/2019 Imaging   1. Significant interval reduction in mixed solid and cystic nodule in the vicinity of the left ovary, with significant improvement or resolution of multiple pelvic, peritoneal, and organ capsule implants. Resolution of previously seen small volume ascites. Findings are consistent with treatment response of abdominal metastatic disease. 2. Status post hysterectomy, oophorectomy, and omentectomy. 3. Nonobstructive left nephrolithiasis. 4. Aortic Atherosclerosis (ICD10-I70.0).   12/23/2019 Tumor Marker   Patient's tumor was tested for the following markers: CA-125 Results of the tumor marker test revealed 5.0   01/24/2020 Tumor Marker   Patient's tumor was tested for the following markers: CA-125 Results of the tumor marker test revealed 4.8   02/24/2020 Tumor Marker   Patient's tumor was tested for the following markers: CA-125. Results of the tumor marker test revealed 4.3.   03/06/2020 Imaging   1. Status post hysterectomy, bilateral oophorectomy, and  omentectomy. 2. Resolution of previously described left adnexal nodularity. 3. No residual disease identified. 4.  Aortic Atherosclerosis (ICD10-I70.0). 5. Left nephrolithiasis.   04/20/2020 Tumor Marker   Patient's tumor was tested for the following markers: CA-125 Results of the tumor marker test revealed 4.6   05/18/2020 Tumor Marker   Patient's tumor was tested for the following markers: CA-125 Results of the tumor marker test revealed 4.7   05/29/2020 Imaging   Status post hysterectomy and bilateral salpingo oophorectomy.   No evidence of recurrent or metastatic disease.   06/08/2020 - 09/13/2021 Chemotherapy   She was on niraparib       06/17/2020 Tumor Marker   Patient's tumor was tested for the following markers: CA-125 Results of the tumor marker test revealed 4.5   08/04/2020 Tumor Marker   Patient's tumor was tested for the following markers: CA-125. Results of the tumor marker test revealed 5.7   09/07/2020 Imaging   1. Status post hysterectomy and bilateral salpingo oophorectomy. No evidence of recurrent or metastatic disease. 2. Nonobstructing left nephrolithiasis. 3. Left-sided colonic diverticulosis without findings of acute diverticulitis. 4. Aortic atherosclerosis.     09/07/2020 Tumor Marker   Patient's tumor was tested for the following markers: CA-125 Results of the tumor marker test revealed 4.8.   10/21/2020 Tumor Marker   Patient's tumor was tested for the following markers: CA-125. Results of the tumor marker test revealed 5.1.   12/14/2020 Tumor Marker   Patient's tumor was tested for the following markers: CA-125. Results of the tumor marker test  revealed 5.5.   04/05/2021 Tumor Marker   Patient's tumor was tested for the following markers: CA-125. Results of the tumor marker test revealed 5.9.   05/31/2021 Tumor Marker   Patient's tumor was tested for the following markers: CA-125. Results of the tumor marker test revealed 8.1.   09/13/2021  Imaging   1. Interval development of multiple new cystic and solid lesions in the abdomen and pelvis measuring up to 10.3 x 7.2 cm and consistent with peritoneal metastatic disease. 2. Nonobstructing left renal stones. 3. Aortic Atherosclerosis (ICD10-I70.0).     09/14/2021 Tumor Marker   Patient's tumor was tested for the following markers: CA-125. Results of the tumor marker test revealed 28.5.   09/16/2021 Echocardiogram   1. Left ventricular ejection fraction, by estimation, is 60 to 65%. The left ventricle has normal function. The left ventricle has no regional wall motion abnormalities. Left ventricular diastolic parameters are consistent with Grade I diastolic dysfunction (impaired relaxation).  2. Right ventricular systolic function is normal. The right ventricular size is normal.  3. The mitral valve is normal in structure. Trivial mitral valve regurgitation. No evidence of mitral stenosis.  4. The aortic valve has an indeterminant number of cusps. Aortic valve regurgitation is trivial. No aortic stenosis is present.  5. The inferior vena cava is normal in size with greater than 50% respiratory variability, suggesting right atrial pressure of 3 mmHg.     09/20/2021 - 01/17/2022 Chemotherapy   Patient is on Treatment Plan : OVARIAN RECURRENT Liposomal Doxorubicin + Carboplatin q28d X 6 Cycles     09/20/2021 - 02/22/2022 Chemotherapy   Patient is on Treatment Plan : OVARIAN RECURRENT Liposomal Doxorubicin + Carboplatin q28d X 6 Cycles     09/23/2021 Tumor Marker   Patient's tumor was tested for the following markers: CA-125. Results of the tumor marker test revealed 27.7.   11/24/2021 Tumor Marker   Patient's tumor was tested for the following markers: CA-125. Results of the tumor marker test revealed 28.4.   12/13/2021 Imaging   1. Overall stable peritoneal carcinomatosis. No new disease is demonstrated. 2. Moderate left-sided hydroureteronephrosis likely due to compression of the left  ureter by the left pelvic mass. 3. Stable left-sided renal calculi. 4. Numerous left renal calculi.     12/22/2021 Tumor Marker   Patient's tumor was tested for the following markers: CA-125. Results of the tumor marker test revealed 24.7.   01/18/2022 Tumor Marker   Patient's tumor was tested for the following markers: CA-125. Results of the tumor marker test revealed 9.9.   01/27/2022 Echocardiogram    1. Left ventricular ejection fraction, by estimation, is 60 to 65%. The left ventricle has normal function. The left ventricle has no regional wall motion abnormalities. Left ventricular diastolic parameters are consistent with Grade I diastolic dysfunction (impaired relaxation). The average left ventricular global longitudinal strain is -21.1 %. The global longitudinal strain is normal.  2. Right ventricular systolic function is normal. The right ventricular size is normal.  3. The mitral valve is abnormal. Mild mitral valve regurgitation.  4. The aortic valve is tricuspid. Aortic valve regurgitation is trivial.  5. The inferior vena cava is normal in size with greater than 50% respiratory variability, suggesting right atrial pressure of 3 mmHg.   02/23/2022 Tumor Marker   Patient's tumor was tested for the following markers: CA-125. Results of the tumor marker test revealed 8.5.   03/18/2022 Imaging   1. Diminished size of nodular and mixed solid and cystic peritoneal  implants in the hepatorenal fossa, right lower quadrant, and left pelvis. 2. Unchanged solid mass in the right pelvis measuring 4.0 x 3.7 cm. 3. Constellation of findings is overall consistent with treatment response 4. Status post hysterectomy, oophorectomy, and omentectomy. 5. Nonobstructive left nephrolithiasis.   Aortic Atherosclerosis (ICD10-I70.0).     03/22/2022 - 08/16/2022 Chemotherapy   Patient is on Treatment Plan : Ovarian Bevacizumab q21d     03/24/2022 Tumor Marker   Patient's tumor was tested for the  following markers: CA-125. Results of the tumor marker test revealed 7.1.   04/28/2022 Tumor Marker   Patient's tumor was tested for the following markers: CA-125. Results of the tumor marker test revealed 7.0.   05/27/2022 Tumor Marker   Patient's tumor was tested for the following markers: CA-125. Results of the tumor marker test revealed 7.7.   06/14/2022 Imaging   1. Slight response to therapy as evidenced by decrease in size of a cystic and solid mass in the left adnexa as well as peritoneal implants in the right paracolic gutter. Solid right adnexal mass and additional peritoneal implants along right hepatic lobe and in the left anatomic pelvis appear stable. 2. Hepatic steatosis. 3. Left renal stones. 4.  Aortic atherosclerosis (ICD10-I70.0).   06/17/2022 Tumor Marker   Patient's tumor was tested for the following markers: CA-125. Results of the tumor marker test revealed 11.2.   09/05/2022 Imaging   CT ABDOMEN PELVIS W CONTRAST  Result Date: 09/06/2022 CLINICAL DATA:  Ovarian cancer.  Restaging.  * Tracking Code: BO * EXAM: CT ABDOMEN AND PELVIS WITH CONTRAST TECHNIQUE: Multidetector CT imaging of the abdomen and pelvis was performed using the standard protocol following bolus administration of intravenous contrast. RADIATION DOSE REDUCTION: This exam was performed according to the departmental dose-optimization program which includes automated exposure control, adjustment of the mA and/or kV according to patient size and/or use of iterative reconstruction technique. CONTRAST:  OMNIPAQUE IOHEXOL 300 MG/ML  SOLN COMPARISON:  06/13/2022 FINDINGS: Lower chest: Unremarkable. Hepatobiliary: No suspicious focal abnormality within the liver parenchyma. There is no evidence for gallstones, gallbladder wall thickening, or pericholecystic fluid. No intrahepatic or extrahepatic biliary dilation. Pancreas: No pancreatic mass lesion evident. Diffuse prominence of the main pancreatic duct is stable  measuring up to 3-4 mm diameter in the head of the pancreas. Spleen: No splenomegaly. No focal mass lesion. Adrenals/Urinary Tract: No adrenal nodule or mass. Right kidney unremarkable. A cluster of nonobstructing stones in the upper pole right kidney is again noted with dominant stone measuring about 6 mm in size. No evidence for hydroureter. The urinary bladder appears normal for the degree of distention. Stomach/Bowel: Tiny hiatal hernia. Stomach otherwise unremarkable. Duodenum is normally positioned as is the ligament of Treitz. No small bowel wall thickening. No small bowel dilatation. The terminal ileum is normal. The appendix is normal. No gross colonic mass. No colonic wall thickening. Diverticular changes are noted in the left colon without evidence of diverticulitis. Vascular/Lymphatic: There is mild atherosclerotic calcification of the abdominal aorta without aneurysm. There is no gastrohepatic or hepatoduodenal ligament lymphadenopathy. No retroperitoneal or mesenteric lymphadenopathy. No pelvic sidewall lymphadenopathy. Reproductive: Right-sided adnexal mass measured previously at 4.2 x 3.8 cm is now 4.9 x 4.6 cm. Cystic and solid left adnexal lesion measured previously at 4.1 x 3.4 cm is now 3.2 x 2.9 cm. 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 The more anterior of the 2 index lesions measures 2.6 x 2.1 cm today compared to 2.4 x  1.6 cm previously. The more posterior lesion is 2.4 x 1.4 cm today compared to 2.0 x 1.9 cm previously. 5 mm soft tissue nodule identified in the inferior paracolic gutter previously at 5 mm measures 10 mm today on image 51/2. Implants along the left posterior peritoneum adjacent to the psoas muscle and iliac vessels appear progressive. 1.6 x 1.3 cm lesion visible on 58/2 was 1.1 x 0.8 cm previously (remeasured). No evidence for ascites. Musculoskeletal: No worrisome lytic or sclerotic osseous abnormality. IMPRESSION: 1. Overall generalized  appearance of mild disease progression. 2. Index right-sided adnexal mass has progressed since the prior with interval decrease in size of the cystic and solid left adnexal lesion. 3. Interval progression of index capsular implants along the medial right liver. 4. Interval progression of soft tissue nodule in the inferior right paracolic gutter and peritoneal implants in the lower left abdomen and pelvis. 5. No evidence for ascites. 6. Nonobstructing left renal stones. 7. Tiny hiatal hernia. 8. Left colonic diverticulosis without diverticulitis. 9.  Aortic Atherosclerosis (ICD10-I70.0). Electronically Signed   By: Kennith Center M.D.   On: 09/06/2022 07:57      09/15/2022 -  Chemotherapy   Patient is on Treatment Plan : OVARIAN Carboplatin (AUC 6) + Paclitaxel (175) q21d X 6 Cycles     11/16/2022 Imaging   CT ABDOMEN PELVIS W CONTRAST  Result Date: 11/16/2022 CLINICAL DATA:  Ovarian carcinoma. Assess treatment response. * Tracking Code: BO * EXAM: CT ABDOMEN AND PELVIS WITH CONTRAST TECHNIQUE: Multidetector CT imaging of the abdomen and pelvis was performed using the standard protocol following bolus administration of intravenous contrast. RADIATION DOSE REDUCTION: This exam was performed according to the departmental dose-optimization program which includes automated exposure control, adjustment of the mA and/or kV according to patient size and/or use of iterative reconstruction technique. CONTRAST:  OMNIPAQUE IOHEXOL 300 MG/ML  SOLN COMPARISON:  None Available. FINDINGS: Lower chest: Lung bases are clear. Hepatobiliary: No focal hepatic lesion. Normal gallbladder. No biliary duct dilatation. Common bile duct is normal. Peritoneal implant described along the margin the RIGHT hepatic liver is much less conspicuous. Capsular lesion measuring 12 mm on image 14/2 is decreased from 26 mm. Pancreas: Pancreas is normal. No ductal dilatation. No pancreatic inflammation. Spleen: Normal spleen Adrenals/urinary  tract: Adrenal glands normal. Multiple calculi within LEFT kidney without obstruction. Ureters and bladder normal. Stomach/Bowel: Stomach, small bowel, appendix, and cecum are normal. Multiple diverticula of the descending colon and sigmoid colon without acute inflammation. Vascular/Lymphatic: Abdominal aorta is normal caliber. No periportal or retroperitoneal adenopathy. No pelvic adenopathy. Reproductive: Solid enhancing mass in the deep RIGHT pelvis adjacent the rectum measures 4.0 x 4.3 cm compared to 4.6 x 4.9 cm. Small adjacent satellite nodule measuring 1.1 cm (image 63) is unchanged. Cystic lesion of the LEFT adnexa with mural nodularity measures 3.0 x 2.4 cm compared to 3.2 x 2.9 cm. No new nodularity in the pelvis. Post hysterectomy. Other: Interval decrease in the peritoneal nodularity. For example peritoneal nodule adjacent to the cecum in the RIGHT iliac fossa measures 6 mm (image 43/2 compared to 10 mm on prior. Lesions on the LEFT iliac vessels less prominent. For example 7 mm lesion on 50/2 decreased from 16 mm. Musculoskeletal: No aggressive osseous lesion. IMPRESSION: 1. Interval decrease in size of RIGHT pelvic mass. 2. Interval decrease in size of LEFT adnexal cystic mass. 3. Interval decrease in size of peritoneal nodularity. 4. Interval decrease in size of perihepatic peritoneal implant. 5. No new metastatic disease. Electronically  Signed   By: Genevive Bi M.D.   On: 11/16/2022 14:34        PHYSICAL EXAMINATION: ECOG PERFORMANCE STATUS: 1 - Symptomatic but completely ambulatory  Vitals:   11/17/22 0906  BP: (!) 143/73  Pulse: 92  Resp: 18  Temp: (!) 97.4 F (36.3 C)  SpO2: 99%   Filed Weights   11/17/22 0906  Weight: 138 lb 3.2 oz (62.7 kg)    GENERAL:alert, no distress and comfortable NEURO: alert & oriented x 3 with fluent speech, no focal motor/sensory deficits  LABORATORY DATA:  I have reviewed the data as listed    Component Value Date/Time   NA 138  11/14/2022 1207   K 3.9 11/14/2022 1207   CL 105 11/14/2022 1207   CO2 26 11/14/2022 1207   GLUCOSE 96 11/14/2022 1207   BUN 12 11/14/2022 1207   CREATININE 0.52 11/14/2022 1207   CALCIUM 9.2 11/14/2022 1207   PROT 6.0 (L) 11/14/2022 1207   ALBUMIN 3.8 11/14/2022 1207   AST 51 (H) 11/14/2022 1207   ALT 40 11/14/2022 1207   ALKPHOS 99 11/14/2022 1207   BILITOT 0.6 11/14/2022 1207   GFRNONAA >60 11/14/2022 1207   GFRAA >60 02/24/2020 0852    No results found for: "SPEP", "UPEP"  Lab Results  Component Value Date   WBC 3.4 (L) 11/14/2022   NEUTROABS 1.2 (L) 11/14/2022   HGB 10.5 (L) 11/14/2022   HCT 30.1 (L) 11/14/2022   MCV 97.4 11/14/2022   PLT 105 (L) 11/14/2022      Chemistry      Component Value Date/Time   NA 138 11/14/2022 1207   K 3.9 11/14/2022 1207   CL 105 11/14/2022 1207   CO2 26 11/14/2022 1207   BUN 12 11/14/2022 1207   CREATININE 0.52 11/14/2022 1207      Component Value Date/Time   CALCIUM 9.2 11/14/2022 1207   ALKPHOS 99 11/14/2022 1207   AST 51 (H) 11/14/2022 1207   ALT 40 11/14/2022 1207   BILITOT 0.6 11/14/2022 1207       RADIOGRAPHIC STUDIES: I have reviewed imaging studies with the patient I have personally reviewed the radiological images as listed and agreed with the findings in the report. CT ABDOMEN PELVIS W CONTRAST  Result Date: 11/16/2022 CLINICAL DATA:  Ovarian carcinoma. Assess treatment response. * Tracking Code: BO * EXAM: CT ABDOMEN AND PELVIS WITH CONTRAST TECHNIQUE: Multidetector CT imaging of the abdomen and pelvis was performed using the standard protocol following bolus administration of intravenous contrast. RADIATION DOSE REDUCTION: This exam was performed according to the departmental dose-optimization program which includes automated exposure control, adjustment of the mA and/or kV according to patient size and/or use of iterative reconstruction technique. CONTRAST:  OMNIPAQUE IOHEXOL 300 MG/ML  SOLN COMPARISON:  None  Available. FINDINGS: Lower chest: Lung bases are clear. Hepatobiliary: No focal hepatic lesion. Normal gallbladder. No biliary duct dilatation. Common bile duct is normal. Peritoneal implant described along the margin the RIGHT hepatic liver is much less conspicuous. Capsular lesion measuring 12 mm on image 14/2 is decreased from 26 mm. Pancreas: Pancreas is normal. No ductal dilatation. No pancreatic inflammation. Spleen: Normal spleen Adrenals/urinary tract: Adrenal glands normal. Multiple calculi within LEFT kidney without obstruction. Ureters and bladder normal. Stomach/Bowel: Stomach, small bowel, appendix, and cecum are normal. Multiple diverticula of the descending colon and sigmoid colon without acute inflammation. Vascular/Lymphatic: Abdominal aorta is normal caliber. No periportal or retroperitoneal adenopathy. No pelvic adenopathy. Reproductive: Solid enhancing mass in  the deep RIGHT pelvis adjacent the rectum measures 4.0 x 4.3 cm compared to 4.6 x 4.9 cm. Small adjacent satellite nodule measuring 1.1 cm (image 63) is unchanged. Cystic lesion of the LEFT adnexa with mural nodularity measures 3.0 x 2.4 cm compared to 3.2 x 2.9 cm. No new nodularity in the pelvis. Post hysterectomy. Other: Interval decrease in the peritoneal nodularity. For example peritoneal nodule adjacent to the cecum in the RIGHT iliac fossa measures 6 mm (image 43/2 compared to 10 mm on prior. Lesions on the LEFT iliac vessels less prominent. For example 7 mm lesion on 50/2 decreased from 16 mm. Musculoskeletal: No aggressive osseous lesion. IMPRESSION: 1. Interval decrease in size of RIGHT pelvic mass. 2. Interval decrease in size of LEFT adnexal cystic mass. 3. Interval decrease in size of peritoneal nodularity. 4. Interval decrease in size of perihepatic peritoneal implant. 5. No new metastatic disease. Electronically Signed   By: Genevive Bi M.D.   On: 11/16/2022 14:34

## 2022-11-17 NOTE — Assessment & Plan Note (Signed)
She is not symptomatic Plan to reduce the dose a little bit We will proceed with treatment without delay

## 2022-11-18 ENCOUNTER — Telehealth: Payer: Self-pay

## 2022-11-18 NOTE — Telephone Encounter (Signed)
-----   Message from Artis Delay, MD sent at 11/18/2022  9:37 AM EDT ----- She has weird sensation yesterday towards end of infusion Can you call her and see if she is ok>

## 2022-11-18 NOTE — Telephone Encounter (Signed)
Called and given below message. She is feeling good today, just feeling tired today. She said that the acid reflux and nausea resolved after the Carboplatin rate was reduced to 1/2 yesterday. She will call the office back for questions/ concerns.

## 2022-11-19 ENCOUNTER — Other Ambulatory Visit: Payer: Self-pay

## 2022-11-22 ENCOUNTER — Other Ambulatory Visit: Payer: Self-pay

## 2022-11-28 ENCOUNTER — Other Ambulatory Visit: Payer: Self-pay

## 2022-12-07 MED FILL — Dexamethasone Sodium Phosphate Inj 100 MG/10ML: INTRAMUSCULAR | Qty: 1 | Status: AC

## 2022-12-07 MED FILL — Fosaprepitant Dimeglumine For IV Infusion 150 MG (Base Eq): INTRAVENOUS | Qty: 5 | Status: AC

## 2022-12-08 ENCOUNTER — Encounter: Payer: Self-pay | Admitting: Hematology and Oncology

## 2022-12-08 ENCOUNTER — Inpatient Hospital Stay: Payer: Medicare Other

## 2022-12-08 ENCOUNTER — Inpatient Hospital Stay: Payer: Medicare Other | Attending: Gynecologic Oncology | Admitting: Hematology and Oncology

## 2022-12-08 ENCOUNTER — Other Ambulatory Visit: Payer: Self-pay

## 2022-12-08 VITALS — BP 133/63 | HR 88 | Temp 97.8°F | Resp 18 | Ht 62.0 in | Wt 139.4 lb

## 2022-12-08 VITALS — BP 140/78 | HR 80 | Resp 17

## 2022-12-08 DIAGNOSIS — Z5111 Encounter for antineoplastic chemotherapy: Secondary | ICD-10-CM | POA: Insufficient documentation

## 2022-12-08 DIAGNOSIS — Z7189 Other specified counseling: Secondary | ICD-10-CM | POA: Diagnosis not present

## 2022-12-08 DIAGNOSIS — C562 Malignant neoplasm of left ovary: Secondary | ICD-10-CM

## 2022-12-08 DIAGNOSIS — G620 Drug-induced polyneuropathy: Secondary | ICD-10-CM

## 2022-12-08 DIAGNOSIS — T451X5A Adverse effect of antineoplastic and immunosuppressive drugs, initial encounter: Secondary | ICD-10-CM

## 2022-12-08 DIAGNOSIS — D61818 Other pancytopenia: Secondary | ICD-10-CM | POA: Diagnosis not present

## 2022-12-08 DIAGNOSIS — C786 Secondary malignant neoplasm of retroperitoneum and peritoneum: Secondary | ICD-10-CM | POA: Insufficient documentation

## 2022-12-08 DIAGNOSIS — R748 Abnormal levels of other serum enzymes: Secondary | ICD-10-CM

## 2022-12-08 LAB — CBC WITH DIFFERENTIAL (CANCER CENTER ONLY)
Abs Immature Granulocytes: 0.01 10*3/uL (ref 0.00–0.07)
Basophils Absolute: 0 10*3/uL (ref 0.0–0.1)
Basophils Relative: 0 %
Eosinophils Absolute: 0 10*3/uL (ref 0.0–0.5)
Eosinophils Relative: 0 %
HCT: 31.3 % — ABNORMAL LOW (ref 36.0–46.0)
Hemoglobin: 10.7 g/dL — ABNORMAL LOW (ref 12.0–15.0)
Immature Granulocytes: 0 %
Lymphocytes Relative: 9 %
Lymphs Abs: 0.4 10*3/uL — ABNORMAL LOW (ref 0.7–4.0)
MCH: 35.3 pg — ABNORMAL HIGH (ref 26.0–34.0)
MCHC: 34.2 g/dL (ref 30.0–36.0)
MCV: 103.3 fL — ABNORMAL HIGH (ref 80.0–100.0)
Monocytes Absolute: 0 10*3/uL — ABNORMAL LOW (ref 0.1–1.0)
Monocytes Relative: 1 %
Neutro Abs: 4.2 10*3/uL (ref 1.7–7.7)
Neutrophils Relative %: 90 %
Platelet Count: 112 10*3/uL — ABNORMAL LOW (ref 150–400)
RBC: 3.03 MIL/uL — ABNORMAL LOW (ref 3.87–5.11)
RDW: 17.4 % — ABNORMAL HIGH (ref 11.5–15.5)
WBC Count: 4.7 10*3/uL (ref 4.0–10.5)
nRBC: 0 % (ref 0.0–0.2)

## 2022-12-08 LAB — CMP (CANCER CENTER ONLY)
ALT: 59 U/L — ABNORMAL HIGH (ref 0–44)
AST: 50 U/L — ABNORMAL HIGH (ref 15–41)
Albumin: 4.2 g/dL (ref 3.5–5.0)
Alkaline Phosphatase: 131 U/L — ABNORMAL HIGH (ref 38–126)
Anion gap: 9 (ref 5–15)
BUN: 14 mg/dL (ref 8–23)
CO2: 24 mmol/L (ref 22–32)
Calcium: 9.3 mg/dL (ref 8.9–10.3)
Chloride: 106 mmol/L (ref 98–111)
Creatinine: 0.5 mg/dL (ref 0.44–1.00)
GFR, Estimated: >60 mL/min (ref >60)
Glucose, Bld: 230 mg/dL — ABNORMAL HIGH (ref 70–99)
Potassium: 3.7 mmol/L (ref 3.5–5.1)
Sodium: 139 mmol/L (ref 135–145)
Total Bilirubin: 1.1 mg/dL (ref 0.3–1.2)
Total Protein: 6.7 g/dL (ref 6.5–8.1)

## 2022-12-08 MED ORDER — HEPARIN SOD (PORK) LOCK FLUSH 100 UNIT/ML IV SOLN
500.0000 [IU] | Freq: Once | INTRAVENOUS | Status: AC | PRN
  Administered 2022-12-08: 500 [IU]

## 2022-12-08 MED ORDER — PALONOSETRON HCL INJECTION 0.25 MG/5ML
0.2500 mg | Freq: Once | INTRAVENOUS | Status: AC
  Administered 2022-12-08: 0.25 mg via INTRAVENOUS
  Filled 2022-12-08: qty 5

## 2022-12-08 MED ORDER — SODIUM CHLORIDE 0.9 % IV SOLN
105.0000 mg/m2 | Freq: Once | INTRAVENOUS | Status: AC
  Administered 2022-12-08: 174 mg via INTRAVENOUS
  Filled 2022-12-08: qty 29

## 2022-12-08 MED ORDER — CETIRIZINE HCL 10 MG/ML IV SOLN
10.0000 mg | Freq: Once | INTRAVENOUS | Status: AC
  Administered 2022-12-08: 10 mg via INTRAVENOUS
  Filled 2022-12-08: qty 1

## 2022-12-08 MED ORDER — FAMOTIDINE IN NACL 20-0.9 MG/50ML-% IV SOLN
20.0000 mg | Freq: Once | INTRAVENOUS | Status: AC
  Administered 2022-12-08: 20 mg via INTRAVENOUS
  Filled 2022-12-08: qty 50

## 2022-12-08 MED ORDER — SODIUM CHLORIDE 0.9 % IV SOLN
150.0000 mg | Freq: Once | INTRAVENOUS | Status: AC
  Administered 2022-12-08: 150 mg via INTRAVENOUS
  Filled 2022-12-08: qty 150

## 2022-12-08 MED ORDER — SODIUM CHLORIDE 0.9 % IV SOLN: Freq: Once | INTRAVENOUS | Status: AC

## 2022-12-08 MED ORDER — SODIUM CHLORIDE 0.9% FLUSH
10.0000 mL | INTRAVENOUS | Status: DC | PRN
  Administered 2022-12-08: 10 mL

## 2022-12-08 MED ORDER — SODIUM CHLORIDE 0.9% FLUSH
10.0000 mL | Freq: Once | INTRAVENOUS | Status: AC
  Administered 2022-12-08: 10 mL

## 2022-12-08 MED ORDER — SODIUM CHLORIDE 0.9 % IV SOLN
10.0000 mg | Freq: Once | INTRAVENOUS | Status: AC
  Administered 2022-12-08: 10 mg via INTRAVENOUS
  Filled 2022-12-08: qty 10

## 2022-12-08 MED ORDER — SODIUM CHLORIDE 0.9 % IV SOLN
470.0000 mg | Freq: Once | INTRAVENOUS | Status: AC
  Administered 2022-12-08: 470 mg via INTRAVENOUS
  Filled 2022-12-08: qty 47

## 2022-12-08 NOTE — Assessment & Plan Note (Signed)
Overall, she tolerated treatment well except for mild pancytopenia and slight neuropathy She had excellent response to therapy The plan will be to proceed for few more cycles of treatment and with repeat imaging study in September before transitioning her to maintenance treatment

## 2022-12-08 NOTE — Assessment & Plan Note (Signed)
This is likely due to side effects of treatment but she is not symptomatic Observe closely for now

## 2022-12-08 NOTE — Assessment & Plan Note (Signed)
She is not symptomatic We will proceed with treatment without delay

## 2022-12-08 NOTE — Assessment & Plan Note (Signed)
she has mild peripheral neuropathy, likely related to side effects of treatment. It is only mild, not bothering the patient. She is currently on reduced dose treatment and we will continue the same I will observe for now If it gets worse in the future, I will consider modifying the dose of the treatment

## 2022-12-08 NOTE — Progress Notes (Signed)
[facility] OFFICE PROGRESS NOTE  Patient Care Team: Geoffry Paradise, MD as PCP - General (Internal Medicine) Leanora Ivanoff, RN as Registered Nurse  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HCC) Overall, she tolerated treatment well except for mild pancytopenia and slight neuropathy She had excellent response to therapy The plan will be to proceed for few more cycles of treatment and with repeat imaging study in September before transitioning her to maintenance treatment   Pancytopenia, acquired [facility]) She is not symptomatic We will proceed with treatment without delay  Peripheral neuropathy due to chemotherapy [facility]) she has mild peripheral neuropathy, likely related to side effects of treatment. It is only mild, not bothering the patient. She is currently on reduced dose treatment and we will continue the same I will observe for now If it gets worse in the future, I will consider modifying the dose of the treatment    Elevated liver enzymes This is likely due to side effects of treatment but she is not symptomatic Observe closely for now  No orders of the defined types were placed in this encounter.   All questions were answered. The patient knows to call the clinic with any problems, questions or concerns. The total time spent in the appointment was 30 minutes encounter with patients including review of chart and various tests results, discussions about plan of care and coordination of care plan   Artis Delay, MD 12/08/2022 10:21 AM  INTERVAL HISTORY: Please see below for problem oriented charting. she returns for treatment follow-up seen prior to chemotherapy She has noticed some neuropathy in the feet but not debilitating It is not causing difficulties with mobility or sleep Denies recent nausea vomiting The patient denies any recent signs or symptoms of bleeding such as spontaneous epistaxis, hematuria or hematochezia. She has some mild  fatigue but able to function without difficulties   REVIEW OF SYSTEMS:   Constitutional: Denies fevers, chills or abnormal weight loss Eyes: Denies blurriness of vision Ears, nose, mouth, throat, and face: Denies mucositis or sore throat Respiratory: Denies cough, dyspnea or wheezes Cardiovascular: Denies palpitation, chest discomfort or lower extremity swelling Gastrointestinal:  Denies nausea, heartburn or change in bowel habits Skin: Denies abnormal skin rashes Lymphatics: Denies new lymphadenopathy or easy bruising Behavioral/Psych: Mood is stable, no new changes  All other systems were reviewed with the patient and are negative.  I have reviewed the past medical history, past surgical history, social history and family history with the patient and they are unchanged from previous note.  ALLERGIES:  is allergic to skin adhesives [cyanoacrylate], shrimp [shellfish allergy], and sulfonamide derivatives.  MEDICATIONS:  Current Outpatient Medications  Medication Sig Dispense Refill   ALPRAZolam (XANAX) 0.25 MG tablet Take 1 tablet by mouth at bedtime as needed for anxiety. 30 tablet 0   carboxymethylcellulose (REFRESH PLUS) 0.5 % SOLN Place 1 drop into both eyes 2 (two) times daily as needed (dry eyes).     Cholecalciferol (VITAMIN D3) 25 MCG (1000 UT) CAPS Take 1,000 Units by mouth daily.      dexamethasone (DECADRON) 4 MG tablet Take 2 tabs at the night before and 2 tab the morning of chemotherapy, every 3 weeks, by mouth x 6 cycles 24 tablet 6   esomeprazole (NEXIUM) 20 MG capsule Take 20 mg by mouth daily.      estradiol (ESTRACE VAGINAL) 0.1 MG/GM vaginal cream Place 1 applicatorful vaginally 3 (three) times a week. 42.5 g 12   lidocaine-prilocaine (EMLA) cream  Apply topically as needed. 30 g 0   lisinopril (ZESTRIL) 20 MG tablet Take 1 tablet (20 mg total) by mouth daily. 90 tablet 1   loratadine (CLARITIN) 10 MG tablet Take 10 mg by mouth daily as needed for allergies.      Multiple Vitamin (MULTIVITAMIN WITH MINERALS) TABS tablet Take 1 tablet by mouth daily.     ondansetron (ZOFRAN) 8 MG tablet Take 1 tablet by mouth 2 times daily as needed. Start on the third day after chemotherapy. 30 tablet 1   prochlorperazine (COMPAZINE) 10 MG tablet Take 1 tablet by mouth every 6 hours as needed (Nausea or vomiting). 30 tablet 1   No current facility-administered medications for this visit.   Facility-Administered Medications Ordered in Other Visits  Medication Dose Route Frequency Provider Last Rate Last Admin   CARBOplatin (PARAPLATIN) 470 mg in sodium chloride 0.9 % 250 mL chemo infusion  470 mg Intravenous Once [name], Josha Weekley, MD       heparin lock flush 100 unit/mL  500 Units Intracatheter Once PRN [name], Jeanett Antonopoulos, MD       PACLitaxel (TAXOL) 174 mg in sodium chloride 0.9 % 250 mL chemo infusion (> 80mg /m2)  105 mg/m2 (Treatment Plan Recorded) Intravenous Once [name], Areliz Rothman, MD       sodium chloride flush (NS) 0.9 % injection 10 mL  10 mL Intracatheter PRN [name], Sharaya Boruff, MD        SUMMARY OF ONCOLOGIC HISTORY: Oncology History Overview Note  Her 2 neu 1+ MSI stable, serous, 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 14.7 cm poorly defined soft tissue mass in central pelvis suspicious for primary ovarian carcinoma, with uterine leiomyosarcoma considered a less likely differential diagnosis. 2. Moderate ascites and diffuse peritoneal carcinomatosis. 3. Several small uterine fibroids.   11/13/2018 Tumor Marker   Patient's tumor was tested for the following markers: CA-125 Results of the tumor marker test revealed 1434   11/21/2018 Procedure   Successful ultrasound-guided diagnostic and therapeutic paracentesis yielding 3.1 liters of peritoneal fluid   11/21/2018 Pathology Results   PERITONEAL/ASCITIC FLUID(SPECIMEN 1 OF 1 COLLECTED  11/21/18): ADENOCARCINOMA. Specimen Clinical Information Pelvic mass suspicious for ovarian cancer Source Peritoneal/Ascitic Fluid, (specimen 1 of 1 collected 11/21/18) Gross Specimen: Received is/are 1000 ccs of dark amber fluid. (CM:cm) Prepared: # Smears: 0 # Concentration Technique Slides (i.e. 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   11/29/2018 Initial Diagnosis   Ovarian cancer (HCC)   11/29/2018 Cancer Staging   Staging form: Ovary, Fallopian Tube, and Primary Peritoneal Carcinoma, AJCC 8th Edition - Clinical: cT3, cN0, cM0 - Signed by Artis Delay, MD on 11/29/2018   12/03/2018 Procedure   Successful ultrasound-guided therapeutic paracentesis yielding 3.7 liters of peritoneal fluid.     12/06/2018 Procedure   Placement of a subcutaneous port device. Catheter tip at the SVC and right atrium junction   12/14/2018 Procedure   Successful ultrasound-guided paracentesis yielding 2.9 L of peritoneal fluid   12/28/2018 Tumor Marker   Patient's tumor was tested for the following markers: CA-125 Results of the tumor marker test revealed 812.   12/28/2018 - 04/22/2019 Chemotherapy   The patient had carboplatin and taxol for neoadjuvant treatment, followed by interval debulking surgery and subsequent adjuvant chemotherapy treatment.     02/01/2019 Imaging   Ct abdomen and  pelvis 8.6 cm left ovarian mass, corresponding to the patient's known primary neoplasm, improved.   Mild peritoneal nodularity/omental caking, improved.   Small abdominopelvic ascites, improved.     02/01/2019 Tumor Marker   Patient's tumor was tested for the following markers: CA-125 Results of the tumor marker test revealed 183.   02/12/2019 Surgery   Preoperative Diagnosis: Stage IIIC ovarian cancer, s/p neoadjuvant chemotherapy      Procedure(s) Performed: 1. Exploratory laparotomy with total abdominal hysterectomy,  bilateral salpingo-oophorectomy, omentectomy radical tumor debulking for ovarian cancer.   Surgeon: Luisa Dago, MD.    Operative Findings: upper abdomen free of disease. No visible omental disease. Small volume (200cc) ascites. 8cm friable mass replacing left ovary and adherent to the sigmoid colon mesentery and ureter on the left.  Anterior fibroid. This represented an optimal cytoreduction (R0) with no gross visible disease remaining.    02/12/2019 Pathology Results   A. OVARY AND FALLOPIAN TUBE, LEFT, SALPINGO-OOPHORECTOMY: - Serous carcinoma, high grade, status post neoadjuvant therapy - See oncology table and comment below B. UTERUS CERVIX WITH RIGHT FALLOPIAN TUBE AND OVARY, HYSTERECTOMY: Uterus: - Serosal surface involved by serous carcinoma - Endomyometrium uninvolved by carcinoma - Benign endometrial polyp (4.1 cm) - Leiomyomata (5.5 cm; largest) - Adenomyosis Cervix: - Uninvolved by carcinoma Left ovary: - Serous carcinoma, high grade Left fallopian tube: - Serous carcinoma, high grade C. SOFT TISSUE, LEFT PELVIC SIDEWALL TUMOR, EXCISION: - Metastatic serous carcinoma, high-grade D. SOFT TISSUE, SIGMOID COLON MESENTERY, EXCISION: - Metastatic serous carcinoma, high-grade E. OMENTUM, TUMOR RESECTION: - Metastatic serous carcinoma, high-grade OVARY or FALLOPIAN TUBE or PRIMARY PERITONEUM: Procedure: Total hysterectomy and bilateral salpingo-oophorectomy, omentectomy and peritoneal biopsies Specimen Integrity: Fragmented Tumor Site: Left ovary and fallopian tube Ovarian Surface Involvement: Present Fallopian Tube Surface Involvement: Present Tumor Size: 6.3 cm (see comment) Histologic Type: Serous carcinoma Histologic Grade: High-grade Implants: Not applicable Other Tissue/ Organ Involvement: Right ovary, right fallopian tube, omentum, mesentery Largest Extrapelvic Peritoneal Focus: Macroscopic Peritoneal/Ascitic Fluid: Malignant Treatment Effect: No definite or  minimal response identified (chemotherapy response score 1 [CRS1] Regional Lymph Nodes: No lymph nodes submitted or found Pathologic Stage Classification (pTNM, AJCC 8th Edition): pT3c, pNX Representative Tumor Block: A4 Comment(s): Additional testing (HER-2, MMR and MSI) are pending. The primary tumor site appears to be the left ovary and fallopian tube. The uterus is only involved on the serosal surface. There is tumor on the anterior peritoneal reflection.  Addendum: Tumor is Her2 negative,MSI stable   02/19/2019 Tumor Marker   Patient's tumor was tested for the following markers: CA-125 Results of the tumor marker test revealed 40.3   04/01/2019 Tumor Marker   Patient's tumor was tested for the following markers: CA-125 Results of the tumor marker test revealed 8.1    Genetic Testing   Negative testing. No pathogenic variants identified on the [employer]. The report date is 04/17/2019.  Somatic genes analyzed through [facility]: ATM, BARD1, BRCA1, BRCA2, BRIP1, CHEK2, MRE11A, NBN, PALB2, RAD51C, RAD51D.  The [facility] gene panel offered by [employer] includes sequencing and rearrangement analysis for the following 36 genes: APC*, ATM*, AXIN2, BARD1, BMPR1A, BRCA1*, BRCA2*, BRIP1*, CDH1*, CDK4, CDKN2A, CHEK2*, DICER1, MLH1*, MSH2*, MSH3, MSH6*, MUTYH*, NBN, NF1*, NTHL1, PALB2*, PMS2*, PTEN*, RAD51C*, RAD51D*, RECQL, SMAD4, SMARCA4, STK11 and TP53* (sequencing and deletion/duplication); HOXB13, POLD1 and POLE (sequencing only); EPCAM and GREM1 (deletion/duplication only).    05/21/2019 Imaging   1. Interval hysterectomy, oophorectomy and omentectomy. No evidence of residual ovarian carcinoma in the pelvis. No  evidence of peritoneal disease. No intraperitoneal free fluid. 2. Nonobstructing LEFT renal calculi.  Normal ureters.   05/22/2019 Tumor Marker   Patient's tumor was tested for the following markers: CA-125 Results of the tumor marker test revealed  5.7.   06/03/2019 Procedure   Successful right IJ vein Port-A-Cath explant.   08/26/2019 Tumor Marker   Patient's tumor was tested for the following markers: CA-125. Results of the tumor marker test revealed 4.9   10/03/2019 Imaging   1. New tumor deposits along the capsular surfaces of the liver, spleen, in the left pelvis, and right paracolic gutter, compatible with metastatic disease. 2. Mild dilation of the dorsal pancreatic duct in the pancreatic body and head, cause uncertain. 3. Nonobstructive left nephrolithiasis. 4. Aortic atherosclerosis.   Aortic Atherosclerosis (ICD10-I70.0   10/10/2019 Procedure   Successful placement of a right IJ approach Power Port with ultrasound and fluoroscopic guidance. The catheter is ready for use.     10/11/2019 Tumor Marker   Patient's tumor was tested for the following markers: CA-125 Results of the tumor marker test revealed 19.6.   10/14/2019 -  Chemotherapy   The patient had carboplatin and gemzar for chemotherapy treatment.     11/04/2019 Tumor Marker   Patient's tumor was tested for the following markers: CA-125 Results of the tumor marker test revealed 11.4   11/28/2019 Tumor Marker   Patient's tumor was tested for the following markers: CA-125 Results of the tumor marker test revealed 5.8   12/20/2019 Imaging   1. Significant interval reduction in mixed solid and cystic nodule in the vicinity of the left ovary, with significant improvement or resolution of multiple pelvic, peritoneal, and organ capsule implants. Resolution of previously seen small volume ascites. Findings are consistent with treatment response of abdominal metastatic disease. 2. Status post hysterectomy, oophorectomy, and omentectomy. 3. Nonobstructive left nephrolithiasis. 4. Aortic Atherosclerosis (ICD10-I70.0).   12/23/2019 Tumor Marker   Patient's tumor was tested for the following markers: CA-125 Results of the tumor marker test revealed 5.0   01/24/2020 Tumor  Marker   Patient's tumor was tested for the following markers: CA-125 Results of the tumor marker test revealed 4.8   02/24/2020 Tumor Marker   Patient's tumor was tested for the following markers: CA-125. Results of the tumor marker test revealed 4.3.   03/06/2020 Imaging   1. Status post hysterectomy, bilateral oophorectomy, and omentectomy. 2. Resolution of previously described left adnexal nodularity. 3. No residual disease identified. 4.  Aortic Atherosclerosis (ICD10-I70.0). 5. Left nephrolithiasis.   04/20/2020 Tumor Marker   Patient's tumor was tested for the following markers: CA-125 Results of the tumor marker test revealed 4.6   05/18/2020 Tumor Marker   Patient's tumor was tested for the following markers: CA-125 Results of the tumor marker test revealed 4.7   05/29/2020 Imaging   Status post hysterectomy and bilateral salpingo oophorectomy.   No evidence of recurrent or metastatic disease.   06/08/2020 - 09/13/2021 Chemotherapy   She was on niraparib       06/17/2020 Tumor Marker   Patient's tumor was tested for the following markers: CA-125 Results of the tumor marker test revealed 4.5   08/04/2020 Tumor Marker   Patient's tumor was tested for the following markers: CA-125. Results of the tumor marker test revealed 5.7   09/07/2020 Imaging   1. Status post hysterectomy and bilateral salpingo oophorectomy. No evidence of recurrent or metastatic disease. 2. Nonobstructing left nephrolithiasis. 3. Left-sided colonic diverticulosis without findings of acute diverticulitis. 4. Aortic  atherosclerosis.     09/07/2020 Tumor Marker   Patient's tumor was tested for the following markers: CA-125 Results of the tumor marker test revealed 4.8.   10/21/2020 Tumor Marker   Patient's tumor was tested for the following markers: CA-125. Results of the tumor marker test revealed 5.1.   12/14/2020 Tumor Marker   Patient's tumor was tested for the following markers:  CA-125. Results of the tumor marker test revealed 5.5.   04/05/2021 Tumor Marker   Patient's tumor was tested for the following markers: CA-125. Results of the tumor marker test revealed 5.9.   05/31/2021 Tumor Marker   Patient's tumor was tested for the following markers: CA-125. Results of the tumor marker test revealed 8.1.   09/13/2021 Imaging   1. Interval development of multiple new cystic and solid lesions in the abdomen and pelvis measuring up to 10.3 x 7.2 cm and consistent with peritoneal metastatic disease. 2. Nonobstructing left renal stones. 3. Aortic Atherosclerosis (ICD10-I70.0).     09/14/2021 Tumor Marker   Patient's tumor was tested for the following markers: CA-125. Results of the tumor marker test revealed 28.5.   09/16/2021 Echocardiogram   1. Left ventricular ejection fraction, by estimation, is 60 to 65%. The left ventricle has normal function. The left ventricle has no regional wall motion abnormalities. Left ventricular diastolic parameters are consistent with Grade I diastolic dysfunction (impaired relaxation).  2. Right ventricular systolic function is normal. The right ventricular size is normal.  3. The mitral valve is normal in structure. Trivial mitral valve regurgitation. No evidence of mitral stenosis.  4. The aortic valve has an indeterminant number of cusps. Aortic valve regurgitation is trivial. No aortic stenosis is present.  5. The inferior vena cava is normal in size with greater than 50% respiratory variability, suggesting right atrial pressure of 3 mmHg.     09/20/2021 - 01/17/2022 Chemotherapy   Patient is on Treatment Plan : OVARIAN RECURRENT Liposomal Doxorubicin + Carboplatin q28d X 6 Cycles     09/20/2021 - 02/22/2022 Chemotherapy   Patient is on Treatment Plan : OVARIAN RECURRENT Liposomal Doxorubicin + Carboplatin q28d X 6 Cycles     09/23/2021 Tumor Marker   Patient's tumor was tested for the following markers: CA-125. Results of the tumor  marker test revealed 27.7.   11/24/2021 Tumor Marker   Patient's tumor was tested for the following markers: CA-125. Results of the tumor marker test revealed 28.4.   12/13/2021 Imaging   1. Overall stable peritoneal carcinomatosis. No new disease is demonstrated. 2. Moderate left-sided hydroureteronephrosis likely due to compression of the left ureter by the left pelvic mass. 3. Stable left-sided renal calculi. 4. Numerous left renal calculi.     12/22/2021 Tumor Marker   Patient's tumor was tested for the following markers: CA-125. Results of the tumor marker test revealed 24.7.   01/18/2022 Tumor Marker   Patient's tumor was tested for the following markers: CA-125. Results of the tumor marker test revealed 9.9.   01/27/2022 Echocardiogram    1. Left ventricular ejection fraction, by estimation, is 60 to 65%. The left ventricle has normal function. The left ventricle has no regional wall motion abnormalities. Left ventricular diastolic parameters are consistent with Grade I diastolic dysfunction (impaired relaxation). The average left ventricular global longitudinal strain is -21.1 %. The global longitudinal strain is normal.  2. Right ventricular systolic function is normal. The right ventricular size is normal.  3. The mitral valve is abnormal. Mild mitral valve regurgitation.  4. The  aortic valve is tricuspid. Aortic valve regurgitation is trivial.  5. The inferior vena cava is normal in size with greater than 50% respiratory variability, suggesting right atrial pressure of 3 mmHg.   02/23/2022 Tumor Marker   Patient's tumor was tested for the following markers: CA-125. Results of the tumor marker test revealed 8.5.   03/18/2022 Imaging   1. Diminished size of nodular and mixed solid and cystic peritoneal implants in the hepatorenal fossa, right lower quadrant, and left pelvis. 2. Unchanged solid mass in the right pelvis measuring 4.0 x 3.7 cm. 3. Constellation of findings is overall  consistent with treatment response 4. Status post hysterectomy, oophorectomy, and omentectomy. 5. Nonobstructive left nephrolithiasis.   Aortic Atherosclerosis (ICD10-I70.0).     03/22/2022 - 08/16/2022 Chemotherapy   Patient is on Treatment Plan : Ovarian Bevacizumab q21d     03/24/2022 Tumor Marker   Patient's tumor was tested for the following markers: CA-125. Results of the tumor marker test revealed 7.1.   04/28/2022 Tumor Marker   Patient's tumor was tested for the following markers: CA-125. Results of the tumor marker test revealed 7.0.   05/27/2022 Tumor Marker   Patient's tumor was tested for the following markers: CA-125. Results of the tumor marker test revealed 7.7.   06/14/2022 Imaging   1. Slight response to therapy as evidenced by decrease in size of a cystic and solid mass in the left adnexa as well as peritoneal implants in the right paracolic gutter. Solid right adnexal mass and additional peritoneal implants along right hepatic lobe and in the left anatomic pelvis appear stable. 2. Hepatic steatosis. 3. Left renal stones. 4.  Aortic atherosclerosis (ICD10-I70.0).   06/17/2022 Tumor Marker   Patient's tumor was tested for the following markers: CA-125. Results of the tumor marker test revealed 11.2.   09/05/2022 Imaging   CT ABDOMEN PELVIS W CONTRAST  Result Date: 09/06/2022 CLINICAL DATA:  Ovarian cancer.  Restaging.  * Tracking Code: BO * EXAM: CT ABDOMEN AND PELVIS WITH CONTRAST TECHNIQUE: Multidetector CT imaging of the abdomen and pelvis was performed using the standard protocol following bolus administration of intravenous contrast. RADIATION DOSE REDUCTION: This exam was performed according to the departmental dose-optimization program which includes automated exposure control, adjustment of the mA and/or kV according to patient size and/or use of iterative reconstruction technique. CONTRAST:  OMNIPAQUE IOHEXOL 300 MG/ML  SOLN COMPARISON:  06/13/2022  FINDINGS: Lower chest: Unremarkable. Hepatobiliary: No suspicious focal abnormality within the liver parenchyma. There is no evidence for gallstones, gallbladder wall thickening, or pericholecystic fluid. No intrahepatic or extrahepatic biliary dilation. Pancreas: No pancreatic mass lesion evident. Diffuse prominence of the main pancreatic duct is stable measuring up to 3-4 mm diameter in the head of the pancreas. Spleen: No splenomegaly. No focal mass lesion. Adrenals/Urinary Tract: No adrenal nodule or mass. Right kidney unremarkable. A cluster of nonobstructing stones in the upper pole right kidney is again noted with dominant stone measuring about 6 mm in size. No evidence for hydroureter. The urinary bladder appears normal for the degree of distention. Stomach/Bowel: Tiny hiatal hernia. Stomach otherwise unremarkable. Duodenum is normally positioned as is the ligament of Treitz. No small bowel wall thickening. No small bowel dilatation. The terminal ileum is normal. The appendix is normal. No gross colonic mass. No colonic wall thickening. Diverticular changes are noted in the left colon without evidence of diverticulitis. Vascular/Lymphatic: There is mild atherosclerotic calcification of the abdominal aorta without aneurysm. There is no gastrohepatic or hepatoduodenal ligament lymphadenopathy.  No retroperitoneal or mesenteric lymphadenopathy. No pelvic sidewall lymphadenopathy. Reproductive: Right-sided adnexal mass measured previously at 4.2 x 3.8 cm is now 4.9 x 4.6 cm. Cystic and solid left adnexal lesion measured previously at 4.1 x 3.4 cm is now 3.2 x 2.9 cm. 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 The more anterior of the 2 index lesions measures 2.6 x 2.1 cm today compared to 2.4 x 1.6 cm previously. The more posterior lesion is 2.4 x 1.4 cm today compared to 2.0 x 1.9 cm previously. 5 mm soft tissue nodule identified in the inferior paracolic gutter previously at 5 mm  measures 10 mm today on image 51/2. Implants along the left posterior peritoneum adjacent to the psoas muscle and iliac vessels appear progressive. 1.6 x 1.3 cm lesion visible on 58/2 was 1.1 x 0.8 cm previously (remeasured). No evidence for ascites. Musculoskeletal: No worrisome lytic or sclerotic osseous abnormality. IMPRESSION: 1. Overall generalized appearance of mild disease progression. 2. Index right-sided adnexal mass has progressed since the prior with interval decrease in size of the cystic and solid left adnexal lesion. 3. Interval progression of index capsular implants along the medial right liver. 4. Interval progression of soft tissue nodule in the inferior right paracolic gutter and peritoneal implants in the lower left abdomen and pelvis. 5. No evidence for ascites. 6. Nonobstructing left renal stones. 7. Tiny hiatal hernia. 8. Left colonic diverticulosis without diverticulitis. 9.  Aortic Atherosclerosis (ICD10-I70.0). Electronically Signed   By: Kennith Center M.D.   On: 09/06/2022 07:57      09/15/2022 -  Chemotherapy   Patient is on Treatment Plan : OVARIAN Carboplatin (AUC 6) + Paclitaxel (175) q21d X 6 Cycles     11/16/2022 Imaging   CT ABDOMEN PELVIS W CONTRAST  Result Date: 11/16/2022 CLINICAL DATA:  Ovarian carcinoma. Assess treatment response. * Tracking Code: BO * EXAM: CT ABDOMEN AND PELVIS WITH CONTRAST TECHNIQUE: Multidetector CT imaging of the abdomen and pelvis was performed using the standard protocol following bolus administration of intravenous contrast. RADIATION DOSE REDUCTION: This exam was performed according to the departmental dose-optimization program which includes automated exposure control, adjustment of the mA and/or kV according to patient size and/or use of iterative reconstruction technique. CONTRAST:  OMNIPAQUE IOHEXOL 300 MG/ML  SOLN COMPARISON:  None Available. FINDINGS: Lower chest: Lung bases are clear. Hepatobiliary: No focal hepatic lesion. Normal  gallbladder. No biliary duct dilatation. Common bile duct is normal. Peritoneal implant described along the margin the RIGHT hepatic liver is much less conspicuous. Capsular lesion measuring 12 mm on image 14/2 is decreased from 26 mm. Pancreas: Pancreas is normal. No ductal dilatation. No pancreatic inflammation. Spleen: Normal spleen Adrenals/urinary tract: Adrenal glands normal. Multiple calculi within LEFT kidney without obstruction. Ureters and bladder normal. Stomach/Bowel: Stomach, small bowel, appendix, and cecum are normal. Multiple diverticula of the descending colon and sigmoid colon without acute inflammation. Vascular/Lymphatic: Abdominal aorta is normal caliber. No periportal or retroperitoneal adenopathy. No pelvic adenopathy. Reproductive: Solid enhancing mass in the deep RIGHT pelvis adjacent the rectum measures 4.0 x 4.3 cm compared to 4.6 x 4.9 cm. Small adjacent satellite nodule measuring 1.1 cm (image 63) is unchanged. Cystic lesion of the LEFT adnexa with mural nodularity measures 3.0 x 2.4 cm compared to 3.2 x 2.9 cm. No new nodularity in the pelvis. Post hysterectomy. Other: Interval decrease in the peritoneal nodularity. For example peritoneal nodule adjacent to the cecum in the RIGHT iliac fossa measures 6 mm (image 43/2  compared to 10 mm on prior. Lesions on the LEFT iliac vessels less prominent. For example 7 mm lesion on 50/2 decreased from 16 mm. Musculoskeletal: No aggressive osseous lesion. IMPRESSION: 1. Interval decrease in size of RIGHT pelvic mass. 2. Interval decrease in size of LEFT adnexal cystic mass. 3. Interval decrease in size of peritoneal nodularity. 4. Interval decrease in size of perihepatic peritoneal implant. 5. No new metastatic disease. Electronically Signed   By: Genevive Bi M.D.   On: 11/16/2022 14:34        PHYSICAL EXAMINATION: ECOG PERFORMANCE STATUS: 1 - Symptomatic but completely ambulatory  Vitals:   12/08/22 0824  BP: 133/63  Pulse: 88   Resp: 18  Temp: 97.8 F (36.6 C)  SpO2: 99%   Filed Weights   12/08/22 0824  Weight: 139 lb 6.4 oz (63.2 kg)    GENERAL:alert, no distress and comfortable NEURO: alert & oriented x 3 with fluent speech, no focal motor/sensory deficits  LABORATORY DATA:  I have reviewed the data as listed    Component Value Date/Time   NA 139 12/08/2022 0757   K 3.7 12/08/2022 0757   CL 106 12/08/2022 0757   CO2 24 12/08/2022 0757   GLUCOSE 230 (H) 12/08/2022 0757   BUN 14 12/08/2022 0757   CREATININE 0.50 12/08/2022 0757   CALCIUM 9.3 12/08/2022 0757   PROT 6.7 12/08/2022 0757   ALBUMIN 4.2 12/08/2022 0757   AST 50 (H) 12/08/2022 0757   ALT 59 (H) 12/08/2022 0757   ALKPHOS 131 (H) 12/08/2022 0757   BILITOT 1.1 12/08/2022 0757   GFRNONAA >60 12/08/2022 0757   GFRAA >60 02/24/2020 0852    No results found for: "SPEP", "UPEP"  Lab Results  Component Value Date   WBC 4.7 12/08/2022   NEUTROABS 4.2 12/08/2022   HGB 10.7 (L) 12/08/2022   HCT 31.3 (L) 12/08/2022   MCV 103.3 (H) 12/08/2022   PLT 112 (L) 12/08/2022      Chemistry      Component Value Date/Time   NA 139 12/08/2022 0757   K 3.7 12/08/2022 0757   CL 106 12/08/2022 0757   CO2 24 12/08/2022 0757   BUN 14 12/08/2022 0757   CREATININE 0.50 12/08/2022 0757      Component Value Date/Time   CALCIUM 9.3 12/08/2022 0757   ALKPHOS 131 (H) 12/08/2022 0757   AST 50 (H) 12/08/2022 0757   ALT 59 (H) 12/08/2022 0757   BILITOT 1.1 12/08/2022 0757

## 2022-12-28 MED FILL — Dexamethasone Sodium Phosphate Inj 100 MG/10ML: INTRAMUSCULAR | Qty: 1 | Status: AC

## 2022-12-28 MED FILL — Fosaprepitant Dimeglumine For IV Infusion 150 MG (Base Eq): INTRAVENOUS | Qty: 5 | Status: AC

## 2022-12-29 ENCOUNTER — Inpatient Hospital Stay: Payer: Medicare Other

## 2022-12-29 ENCOUNTER — Encounter: Payer: Self-pay | Admitting: Hematology and Oncology

## 2022-12-29 ENCOUNTER — Ambulatory Visit: Payer: Medicare Other | Admitting: Physician Assistant

## 2022-12-29 ENCOUNTER — Other Ambulatory Visit: Payer: Self-pay

## 2022-12-29 ENCOUNTER — Inpatient Hospital Stay: Payer: Medicare Other | Attending: Gynecologic Oncology | Admitting: Hematology and Oncology

## 2022-12-29 ENCOUNTER — Inpatient Hospital Stay: Payer: Medicare Other | Admitting: Dietician

## 2022-12-29 VITALS — BP 135/78 | HR 85 | Temp 97.6°F | Resp 18 | Ht 62.0 in | Wt 140.2 lb

## 2022-12-29 VITALS — BP 154/77 | HR 66

## 2022-12-29 DIAGNOSIS — D61818 Other pancytopenia: Secondary | ICD-10-CM | POA: Diagnosis not present

## 2022-12-29 DIAGNOSIS — R5383 Other fatigue: Secondary | ICD-10-CM

## 2022-12-29 DIAGNOSIS — C786 Secondary malignant neoplasm of retroperitoneum and peritoneum: Secondary | ICD-10-CM | POA: Insufficient documentation

## 2022-12-29 DIAGNOSIS — C562 Malignant neoplasm of left ovary: Secondary | ICD-10-CM

## 2022-12-29 DIAGNOSIS — T451X5A Adverse effect of antineoplastic and immunosuppressive drugs, initial encounter: Secondary | ICD-10-CM

## 2022-12-29 DIAGNOSIS — Z5111 Encounter for antineoplastic chemotherapy: Secondary | ICD-10-CM | POA: Diagnosis present

## 2022-12-29 DIAGNOSIS — G620 Drug-induced polyneuropathy: Secondary | ICD-10-CM

## 2022-12-29 DIAGNOSIS — Z7189 Other specified counseling: Secondary | ICD-10-CM

## 2022-12-29 DIAGNOSIS — R748 Abnormal levels of other serum enzymes: Secondary | ICD-10-CM

## 2022-12-29 LAB — CMP (CANCER CENTER ONLY)
ALT: 62 U/L — ABNORMAL HIGH (ref 0–44)
AST: 58 U/L — ABNORMAL HIGH (ref 15–41)
Albumin: 4.2 g/dL (ref 3.5–5.0)
Alkaline Phosphatase: 144 U/L — ABNORMAL HIGH (ref 38–126)
Anion gap: 8 (ref 5–15)
BUN: 14 mg/dL (ref 8–23)
CO2: 24 mmol/L (ref 22–32)
Calcium: 8.8 mg/dL — ABNORMAL LOW (ref 8.9–10.3)
Chloride: 107 mmol/L (ref 98–111)
Creatinine: 0.56 mg/dL (ref 0.44–1.00)
GFR, Estimated: >60 mL/min (ref >60)
Glucose, Bld: 207 mg/dL — ABNORMAL HIGH (ref 70–99)
Potassium: 3.7 mmol/L (ref 3.5–5.1)
Sodium: 139 mmol/L (ref 135–145)
Total Bilirubin: 1.2 mg/dL (ref 0.3–1.2)
Total Protein: 6.6 g/dL (ref 6.5–8.1)

## 2022-12-29 LAB — CBC WITH DIFFERENTIAL (CANCER CENTER ONLY)
Abs Immature Granulocytes: 0.02 10*3/uL (ref 0.00–0.07)
Basophils Absolute: 0 10*3/uL (ref 0.0–0.1)
Basophils Relative: 0 %
Eosinophils Absolute: 0 10*3/uL (ref 0.0–0.5)
Eosinophils Relative: 0 %
HCT: 30.8 % — ABNORMAL LOW (ref 36.0–46.0)
Hemoglobin: 10.3 g/dL — ABNORMAL LOW (ref 12.0–15.0)
Immature Granulocytes: 0 %
Lymphocytes Relative: 9 %
Lymphs Abs: 0.4 10*3/uL — ABNORMAL LOW (ref 0.7–4.0)
MCH: 35.5 pg — ABNORMAL HIGH (ref 26.0–34.0)
MCHC: 33.4 g/dL (ref 30.0–36.0)
MCV: 106.2 fL — ABNORMAL HIGH (ref 80.0–100.0)
Monocytes Absolute: 0 10*3/uL — ABNORMAL LOW (ref 0.1–1.0)
Monocytes Relative: 1 %
Neutro Abs: 4.4 10*3/uL (ref 1.7–7.7)
Neutrophils Relative %: 90 %
Platelet Count: 103 10*3/uL — ABNORMAL LOW (ref 150–400)
RBC: 2.9 MIL/uL — ABNORMAL LOW (ref 3.87–5.11)
RDW: 16.5 % — ABNORMAL HIGH (ref 11.5–15.5)
WBC Count: 4.9 10*3/uL (ref 4.0–10.5)
nRBC: 0 % (ref 0.0–0.2)

## 2022-12-29 MED ORDER — CETIRIZINE HCL 10 MG/ML IV SOLN
10.0000 mg | Freq: Once | INTRAVENOUS | Status: AC
  Administered 2022-12-29: 10 mg via INTRAVENOUS
  Filled 2022-12-29: qty 1

## 2022-12-29 MED ORDER — FAMOTIDINE IN NACL 20-0.9 MG/50ML-% IV SOLN
20.0000 mg | Freq: Once | INTRAVENOUS | Status: AC | PRN
  Administered 2022-12-29: 20 mg via INTRAVENOUS

## 2022-12-29 MED ORDER — METHYLPREDNISOLONE SODIUM SUCC 125 MG IJ SOLR
125.0000 mg | Freq: Once | INTRAMUSCULAR | Status: AC | PRN
  Administered 2022-12-29: 62.5 mg via INTRAVENOUS

## 2022-12-29 MED ORDER — SODIUM CHLORIDE 0.9 % IV SOLN
470.0000 mg | Freq: Once | INTRAVENOUS | Status: AC
  Administered 2022-12-29: 470 mg via INTRAVENOUS
  Filled 2022-12-29: qty 47

## 2022-12-29 MED ORDER — SODIUM CHLORIDE 0.9 % IV SOLN
105.0000 mg/m2 | Freq: Once | INTRAVENOUS | Status: AC
  Administered 2022-12-29: 174 mg via INTRAVENOUS
  Filled 2022-12-29: qty 29

## 2022-12-29 MED ORDER — PALONOSETRON HCL INJECTION 0.25 MG/5ML
0.2500 mg | Freq: Once | INTRAVENOUS | Status: AC
  Administered 2022-12-29: 0.25 mg via INTRAVENOUS
  Filled 2022-12-29: qty 5

## 2022-12-29 MED ORDER — SODIUM CHLORIDE 0.9 % IV SOLN: Freq: Once | INTRAVENOUS | Status: AC

## 2022-12-29 MED ORDER — SODIUM CHLORIDE 0.9 % IV SOLN: Freq: Once | INTRAVENOUS | Status: DC | PRN

## 2022-12-29 MED ORDER — SODIUM CHLORIDE 0.9% FLUSH
10.0000 mL | Freq: Once | INTRAVENOUS | Status: AC
  Administered 2022-12-29: 10 mL

## 2022-12-29 MED ORDER — FAMOTIDINE IN NACL 20-0.9 MG/50ML-% IV SOLN
20.0000 mg | Freq: Once | INTRAVENOUS | Status: AC
  Administered 2022-12-29: 20 mg via INTRAVENOUS
  Filled 2022-12-29: qty 50

## 2022-12-29 MED ORDER — SODIUM CHLORIDE 0.9 % IV SOLN
150.0000 mg | Freq: Once | INTRAVENOUS | Status: AC
  Administered 2022-12-29: 150 mg via INTRAVENOUS
  Filled 2022-12-29: qty 150

## 2022-12-29 MED ORDER — HEPARIN SOD (PORK) LOCK FLUSH 100 UNIT/ML IV SOLN
500.0000 [IU] | Freq: Once | INTRAVENOUS | Status: AC | PRN
  Administered 2022-12-29: 500 [IU]

## 2022-12-29 MED ORDER — SODIUM CHLORIDE 0.9 % IV SOLN
10.0000 mg | Freq: Once | INTRAVENOUS | Status: AC
  Administered 2022-12-29: 10 mg via INTRAVENOUS
  Filled 2022-12-29: qty 10

## 2022-12-29 MED ORDER — SODIUM CHLORIDE 0.9% FLUSH
10.0000 mL | INTRAVENOUS | Status: DC | PRN
  Administered 2022-12-29: 10 mL

## 2022-12-29 NOTE — Assessment & Plan Note (Signed)
She is not symptomatic We will proceed with treatment without delay

## 2022-12-29 NOTE — Progress Notes (Signed)
[facility] OFFICE PROGRESS NOTE  Patient Care Team: Geoffry Paradise, MD as PCP - General (Internal Medicine) Leanora Ivanoff, RN as Registered Nurse  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HCC) Overall, she tolerated treatment well except for mild pancytopenia and slight neuropathy She had excellent response to therapy The plan will be to proceed for few more cycles of treatment and with repeat imaging study in September before transitioning her to maintenance treatment   Peripheral neuropathy due to chemotherapy [facility]) she has mild peripheral neuropathy, likely related to side effects of treatment. It is only mild, not bothering the patient. She is currently on reduced dose treatment and we will continue the same I will observe for now If it gets worse in the future, I will consider modifying the dose of the treatment  Pancytopenia, acquired [facility]) She is not symptomatic We will proceed with treatment without delay  Elevated liver enzymes This is likely due to side effects of treatment but she is not symptomatic Observe closely for now  Chemotherapy-induced fatigue We discussed potential enrollment in clinical trial She will think about it  Orders Placed This Encounter  Procedures   CBC with Differential (Cancer Center Only)    Standing Status:   Future    Standing Expiration Date:   02/09/2024   CMP (Cancer Center only)    Standing Status:   Future    Standing Expiration Date:   02/09/2024    All questions were answered. The patient knows to call the clinic with any problems, questions or concerns. The total time spent in the appointment was 30 minutes encounter with patients including review of chart and various tests results, discussions about plan of care and coordination of care plan   Artis Delay, MD 12/29/2022 10:33 AM  INTERVAL HISTORY: Please see below for problem oriented charting. she returns for treatment follow-up to be seen  prior to chemotherapy Her symptoms are about the same She is still have neuropathy but not severe or worse She has fatigue Denies nausea or constipation We discussed future plan of care and timing of imaging  REVIEW OF SYSTEMS:   Constitutional: Denies fevers, chills or abnormal weight loss Eyes: Denies blurriness of vision Ears, nose, mouth, throat, and face: Denies mucositis or sore throat Respiratory: Denies cough, dyspnea or wheezes Cardiovascular: Denies palpitation, chest discomfort or lower extremity swelling Gastrointestinal:  Denies nausea, heartburn or change in bowel habits Skin: Denies abnormal skin rashes Lymphatics: Denies new lymphadenopathy or easy bruising Behavioral/Psych: Mood is stable, no new changes  All other systems were reviewed with the patient and are negative.  I have reviewed the past medical history, past surgical history, social history and family history with the patient and they are unchanged from previous note.  ALLERGIES:  is allergic to skin adhesives [cyanoacrylate], shrimp [shellfish allergy], and sulfonamide derivatives.  MEDICATIONS:  Current Outpatient Medications  Medication Sig Dispense Refill   ALPRAZolam (XANAX) 0.25 MG tablet Take 1 tablet by mouth at bedtime as needed for anxiety. 30 tablet 0   carboxymethylcellulose (REFRESH PLUS) 0.5 % SOLN Place 1 drop into both eyes 2 (two) times daily as needed (dry eyes).     Cholecalciferol (VITAMIN D3) 25 MCG (1000 UT) CAPS Take 1,000 Units by mouth daily.      dexamethasone (DECADRON) 4 MG tablet Take 2 tabs at the night before and 2 tab the morning of chemotherapy, every 3 weeks, by mouth x 6 cycles 24 tablet 6   esomeprazole (  NEXIUM) 20 MG capsule Take 20 mg by mouth daily.      estradiol (ESTRACE VAGINAL) 0.1 MG/GM vaginal cream Place 1 applicatorful vaginally 3 (three) times a week. 42.5 g 12   lidocaine-prilocaine (EMLA) cream Apply topically as needed. 30 g 0   lisinopril (ZESTRIL) 20 MG  tablet Take 1 tablet (20 mg total) by mouth daily. 90 tablet 1   loratadine (CLARITIN) 10 MG tablet Take 10 mg by mouth daily as needed for allergies.     Multiple Vitamin (MULTIVITAMIN WITH MINERALS) TABS tablet Take 1 tablet by mouth daily.     ondansetron (ZOFRAN) 8 MG tablet Take 1 tablet by mouth 2 times daily as needed. Start on the third day after chemotherapy. 30 tablet 1   prochlorperazine (COMPAZINE) 10 MG tablet Take 1 tablet by mouth every 6 hours as needed (Nausea or vomiting). 30 tablet 1   No current facility-administered medications for this visit.   Facility-Administered Medications Ordered in Other Visits  Medication Dose Route Frequency Provider Last Rate Last Admin   CARBOplatin (PARAPLATIN) 470 mg in sodium chloride 0.9 % 250 mL chemo infusion  470 mg Intravenous Once [name], , MD       heparin lock flush 100 unit/mL  500 Units Intracatheter Once PRN [name], , MD       PACLitaxel (TAXOL) 174 mg in sodium chloride 0.9 % 250 mL chemo infusion (> 80mg /m2)  105 mg/m2 (Treatment Plan Recorded) Intravenous Once Artis Delay, MD 93 mL/hr at 12/29/22 1014 174 mg at 12/29/22 1014   sodium chloride flush (NS) 0.9 % injection 10 mL  10 mL Intracatheter PRN Artis Delay, MD        SUMMARY OF ONCOLOGIC HISTORY: Oncology History Overview Note  Her 2 neu 1+ MSI stable, serous, 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 14.7 cm poorly defined soft tissue mass in central pelvis suspicious for primary ovarian carcinoma, with uterine leiomyosarcoma considered a less likely differential diagnosis. 2. Moderate ascites and diffuse peritoneal carcinomatosis. 3. Several small uterine fibroids.   11/13/2018 Tumor Marker   Patient's tumor was tested for the following markers: CA-125 Results of the tumor marker test revealed 1434   11/21/2018 Procedure   Successful  ultrasound-guided diagnostic and therapeutic paracentesis yielding 3.1 liters of peritoneal fluid   11/21/2018 Pathology Results   PERITONEAL/ASCITIC FLUID(SPECIMEN 1 OF 1 COLLECTED 11/21/18): ADENOCARCINOMA. Specimen Clinical Information Pelvic mass suspicious for ovarian cancer Source Peritoneal/Ascitic Fluid, (specimen 1 of 1 collected 11/21/18) Gross Specimen: Received is/are 1000 ccs of dark amber fluid. (CM:cm) Prepared: # Smears: 0 # Concentration Technique Slides (i.e. 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   11/29/2018 Initial Diagnosis   Ovarian cancer (HCC)   11/29/2018 Cancer Staging   Staging form: Ovary, Fallopian Tube, and Primary Peritoneal Carcinoma, AJCC 8th Edition - Clinical: cT3, cN0, cM0 - Signed by Artis Delay, MD on 11/29/2018   12/03/2018 Procedure   Successful ultrasound-guided therapeutic paracentesis yielding 3.7 liters of peritoneal fluid.     12/06/2018 Procedure   Placement of a subcutaneous port device. Catheter tip at the SVC and right atrium junction   12/14/2018 Procedure   Successful ultrasound-guided paracentesis yielding 2.9 L of peritoneal fluid   12/28/2018 Tumor Marker   Patient's tumor was tested for the following markers: CA-125 Results  of the tumor marker test revealed 812.   12/28/2018 - 04/22/2019 Chemotherapy   The patient had carboplatin and taxol for neoadjuvant treatment, followed by interval debulking surgery and subsequent adjuvant chemotherapy treatment.     02/01/2019 Imaging   Ct abdomen and pelvis 8.6 cm left ovarian mass, corresponding to the patient's known primary neoplasm, improved.   Mild peritoneal nodularity/omental caking, improved.   Small abdominopelvic ascites, improved.     02/01/2019 Tumor Marker   Patient's tumor was tested for the following markers: CA-125 Results of the tumor marker test revealed 183.   02/12/2019  Surgery   Preoperative Diagnosis: Stage IIIC ovarian cancer, s/p neoadjuvant chemotherapy      Procedure(s) Performed: 1. Exploratory laparotomy with total abdominal hysterectomy, bilateral salpingo-oophorectomy, omentectomy radical tumor debulking for ovarian cancer.   Surgeon: Luisa Dago, MD.    Operative Findings: upper abdomen free of disease. No visible omental disease. Small volume (200cc) ascites. 8cm friable mass replacing left ovary and adherent to the sigmoid colon mesentery and ureter on the left.  Anterior fibroid. This represented an optimal cytoreduction (R0) with no gross visible disease remaining.    02/12/2019 Pathology Results   A. OVARY AND FALLOPIAN TUBE, LEFT, SALPINGO-OOPHORECTOMY: - Serous carcinoma, high grade, status post neoadjuvant therapy - See oncology table and comment below B. UTERUS CERVIX WITH RIGHT FALLOPIAN TUBE AND OVARY, HYSTERECTOMY: Uterus: - Serosal surface involved by serous carcinoma - Endomyometrium uninvolved by carcinoma - Benign endometrial polyp (4.1 cm) - Leiomyomata (5.5 cm; largest) - Adenomyosis Cervix: - Uninvolved by carcinoma Left ovary: - Serous carcinoma, high grade Left fallopian tube: - Serous carcinoma, high grade C. SOFT TISSUE, LEFT PELVIC SIDEWALL TUMOR, EXCISION: - Metastatic serous carcinoma, high-grade D. SOFT TISSUE, SIGMOID COLON MESENTERY, EXCISION: - Metastatic serous carcinoma, high-grade E. OMENTUM, TUMOR RESECTION: - Metastatic serous carcinoma, high-grade OVARY or FALLOPIAN TUBE or PRIMARY PERITONEUM: Procedure: Total hysterectomy and bilateral salpingo-oophorectomy, omentectomy and peritoneal biopsies Specimen Integrity: Fragmented Tumor Site: Left ovary and fallopian tube Ovarian Surface Involvement: Present Fallopian Tube Surface Involvement: Present Tumor Size: 6.3 cm (see comment) Histologic Type: Serous carcinoma Histologic Grade: High-grade Implants: Not applicable Other Tissue/ Organ  Involvement: Right ovary, right fallopian tube, omentum, mesentery Largest Extrapelvic Peritoneal Focus: Macroscopic Peritoneal/Ascitic Fluid: Malignant Treatment Effect: No definite or minimal response identified (chemotherapy response score 1 [CRS1] Regional Lymph Nodes: No lymph nodes submitted or found Pathologic Stage Classification (pTNM, AJCC 8th Edition): pT3c, pNX Representative Tumor Block: A4 Comment(s): Additional testing (HER-2, MMR and MSI) are pending. The primary tumor site appears to be the left ovary and fallopian tube. The uterus is only involved on the serosal surface. There is tumor on the anterior peritoneal reflection.  Addendum: Tumor is Her2 negative,MSI stable   02/19/2019 Tumor Marker   Patient's tumor was tested for the following markers: CA-125 Results of the tumor marker test revealed 40.3   04/01/2019 Tumor Marker   Patient's tumor was tested for the following markers: CA-125 Results of the tumor marker test revealed 8.1    Genetic Testing   Negative testing. No pathogenic variants identified on the [employer]. The report date is 04/17/2019.  Somatic genes analyzed through [facility]: ATM, BARD1, BRCA1, BRCA2, BRIP1, CHEK2, MRE11A, NBN, PALB2, RAD51C, RAD51D.  The [facility] gene panel offered by [employer] includes sequencing and rearrangement analysis for the following 36 genes: APC*, ATM*, AXIN2, BARD1, BMPR1A, BRCA1*, BRCA2*, BRIP1*, CDH1*, CDK4, CDKN2A, CHEK2*, DICER1, MLH1*, MSH2*, MSH3, MSH6*, MUTYH*, NBN, NF1*, NTHL1, PALB2*, PMS2*, PTEN*,  RAD51C*, RAD51D*, RECQL, SMAD4, SMARCA4, STK11 and TP53* (sequencing and deletion/duplication); HOXB13, POLD1 and POLE (sequencing only); EPCAM and GREM1 (deletion/duplication only).    05/21/2019 Imaging   1. Interval hysterectomy, oophorectomy and omentectomy. No evidence of residual ovarian carcinoma in the pelvis. No evidence of peritoneal disease. No intraperitoneal free fluid. 2.  Nonobstructing LEFT renal calculi.  Normal ureters.   05/22/2019 Tumor Marker   Patient's tumor was tested for the following markers: CA-125 Results of the tumor marker test revealed 5.7.   06/03/2019 Procedure   Successful right IJ vein Port-A-Cath explant.   08/26/2019 Tumor Marker   Patient's tumor was tested for the following markers: CA-125. Results of the tumor marker test revealed 4.9   10/03/2019 Imaging   1. New tumor deposits along the capsular surfaces of the liver, spleen, in the left pelvis, and right paracolic gutter, compatible with metastatic disease. 2. Mild dilation of the dorsal pancreatic duct in the pancreatic body and head, cause uncertain. 3. Nonobstructive left nephrolithiasis. 4. Aortic atherosclerosis.   Aortic Atherosclerosis (ICD10-I70.0   10/10/2019 Procedure   Successful placement of a right IJ approach Power Port with ultrasound and fluoroscopic guidance. The catheter is ready for use.     10/11/2019 Tumor Marker   Patient's tumor was tested for the following markers: CA-125 Results of the tumor marker test revealed 19.6.   10/14/2019 -  Chemotherapy   The patient had carboplatin and gemzar for chemotherapy treatment.     11/04/2019 Tumor Marker   Patient's tumor was tested for the following markers: CA-125 Results of the tumor marker test revealed 11.4   11/28/2019 Tumor Marker   Patient's tumor was tested for the following markers: CA-125 Results of the tumor marker test revealed 5.8   12/20/2019 Imaging   1. Significant interval reduction in mixed solid and cystic nodule in the vicinity of the left ovary, with significant improvement or resolution of multiple pelvic, peritoneal, and organ capsule implants. Resolution of previously seen small volume ascites. Findings are consistent with treatment response of abdominal metastatic disease. 2. Status post hysterectomy, oophorectomy, and omentectomy. 3. Nonobstructive left nephrolithiasis. 4. Aortic  Atherosclerosis (ICD10-I70.0).   12/23/2019 Tumor Marker   Patient's tumor was tested for the following markers: CA-125 Results of the tumor marker test revealed 5.0   01/24/2020 Tumor Marker   Patient's tumor was tested for the following markers: CA-125 Results of the tumor marker test revealed 4.8   02/24/2020 Tumor Marker   Patient's tumor was tested for the following markers: CA-125. Results of the tumor marker test revealed 4.3.   03/06/2020 Imaging   1. Status post hysterectomy, bilateral oophorectomy, and omentectomy. 2. Resolution of previously described left adnexal nodularity. 3. No residual disease identified. 4.  Aortic Atherosclerosis (ICD10-I70.0). 5. Left nephrolithiasis.   04/20/2020 Tumor Marker   Patient's tumor was tested for the following markers: CA-125 Results of the tumor marker test revealed 4.6   05/18/2020 Tumor Marker   Patient's tumor was tested for the following markers: CA-125 Results of the tumor marker test revealed 4.7   05/29/2020 Imaging   Status post hysterectomy and bilateral salpingo oophorectomy.   No evidence of recurrent or metastatic disease.   06/08/2020 - 09/13/2021 Chemotherapy   She was on niraparib       06/17/2020 Tumor Marker   Patient's tumor was tested for the following markers: CA-125 Results of the tumor marker test revealed 4.5   08/04/2020 Tumor Marker   Patient's tumor was tested for the following markers:  CA-125. Results of the tumor marker test revealed 5.7   09/07/2020 Imaging   1. Status post hysterectomy and bilateral salpingo oophorectomy. No evidence of recurrent or metastatic disease. 2. Nonobstructing left nephrolithiasis. 3. Left-sided colonic diverticulosis without findings of acute diverticulitis. 4. Aortic atherosclerosis.     09/07/2020 Tumor Marker   Patient's tumor was tested for the following markers: CA-125 Results of the tumor marker test revealed 4.8.   10/21/2020 Tumor Marker   Patient's tumor  was tested for the following markers: CA-125. Results of the tumor marker test revealed 5.1.   12/14/2020 Tumor Marker   Patient's tumor was tested for the following markers: CA-125. Results of the tumor marker test revealed 5.5.   04/05/2021 Tumor Marker   Patient's tumor was tested for the following markers: CA-125. Results of the tumor marker test revealed 5.9.   05/31/2021 Tumor Marker   Patient's tumor was tested for the following markers: CA-125. Results of the tumor marker test revealed 8.1.   09/13/2021 Imaging   1. Interval development of multiple new cystic and solid lesions in the abdomen and pelvis measuring up to 10.3 x 7.2 cm and consistent with peritoneal metastatic disease. 2. Nonobstructing left renal stones. 3. Aortic Atherosclerosis (ICD10-I70.0).     09/14/2021 Tumor Marker   Patient's tumor was tested for the following markers: CA-125. Results of the tumor marker test revealed 28.5.   09/16/2021 Echocardiogram   1. Left ventricular ejection fraction, by estimation, is 60 to 65%. The left ventricle has normal function. The left ventricle has no regional wall motion abnormalities. Left ventricular diastolic parameters are consistent with Grade I diastolic dysfunction (impaired relaxation).  2. Right ventricular systolic function is normal. The right ventricular size is normal.  3. The mitral valve is normal in structure. Trivial mitral valve regurgitation. No evidence of mitral stenosis.  4. The aortic valve has an indeterminant number of cusps. Aortic valve regurgitation is trivial. No aortic stenosis is present.  5. The inferior vena cava is normal in size with greater than 50% respiratory variability, suggesting right atrial pressure of 3 mmHg.     09/20/2021 - 01/17/2022 Chemotherapy   Patient is on Treatment Plan : OVARIAN RECURRENT Liposomal Doxorubicin + Carboplatin q28d X 6 Cycles     09/20/2021 - 02/22/2022 Chemotherapy   Patient is on Treatment Plan : OVARIAN  RECURRENT Liposomal Doxorubicin + Carboplatin q28d X 6 Cycles     09/23/2021 Tumor Marker   Patient's tumor was tested for the following markers: CA-125. Results of the tumor marker test revealed 27.7.   11/24/2021 Tumor Marker   Patient's tumor was tested for the following markers: CA-125. Results of the tumor marker test revealed 28.4.   12/13/2021 Imaging   1. Overall stable peritoneal carcinomatosis. No new disease is demonstrated. 2. Moderate left-sided hydroureteronephrosis likely due to compression of the left ureter by the left pelvic mass. 3. Stable left-sided renal calculi. 4. Numerous left renal calculi.     12/22/2021 Tumor Marker   Patient's tumor was tested for the following markers: CA-125. Results of the tumor marker test revealed 24.7.   01/18/2022 Tumor Marker   Patient's tumor was tested for the following markers: CA-125. Results of the tumor marker test revealed 9.9.   01/27/2022 Echocardiogram    1. Left ventricular ejection fraction, by estimation, is 60 to 65%. The left ventricle has normal function. The left ventricle has no regional wall motion abnormalities. Left ventricular diastolic parameters are consistent with Grade I diastolic dysfunction (impaired  relaxation). The average left ventricular global longitudinal strain is -21.1 %. The global longitudinal strain is normal.  2. Right ventricular systolic function is normal. The right ventricular size is normal.  3. The mitral valve is abnormal. Mild mitral valve regurgitation.  4. The aortic valve is tricuspid. Aortic valve regurgitation is trivial.  5. The inferior vena cava is normal in size with greater than 50% respiratory variability, suggesting right atrial pressure of 3 mmHg.   02/23/2022 Tumor Marker   Patient's tumor was tested for the following markers: CA-125. Results of the tumor marker test revealed 8.5.   03/18/2022 Imaging   1. Diminished size of nodular and mixed solid and cystic peritoneal  implants in the hepatorenal fossa, right lower quadrant, and left pelvis. 2. Unchanged solid mass in the right pelvis measuring 4.0 x 3.7 cm. 3. Constellation of findings is overall consistent with treatment response 4. Status post hysterectomy, oophorectomy, and omentectomy. 5. Nonobstructive left nephrolithiasis.   Aortic Atherosclerosis (ICD10-I70.0).     03/22/2022 - 08/16/2022 Chemotherapy   Patient is on Treatment Plan : Ovarian Bevacizumab q21d     03/24/2022 Tumor Marker   Patient's tumor was tested for the following markers: CA-125. Results of the tumor marker test revealed 7.1.   04/28/2022 Tumor Marker   Patient's tumor was tested for the following markers: CA-125. Results of the tumor marker test revealed 7.0.   05/27/2022 Tumor Marker   Patient's tumor was tested for the following markers: CA-125. Results of the tumor marker test revealed 7.7.   06/14/2022 Imaging   1. Slight response to therapy as evidenced by decrease in size of a cystic and solid mass in the left adnexa as well as peritoneal implants in the right paracolic gutter. Solid right adnexal mass and additional peritoneal implants along right hepatic lobe and in the left anatomic pelvis appear stable. 2. Hepatic steatosis. 3. Left renal stones. 4.  Aortic atherosclerosis (ICD10-I70.0).   06/17/2022 Tumor Marker   Patient's tumor was tested for the following markers: CA-125. Results of the tumor marker test revealed 11.2.   09/05/2022 Imaging   CT ABDOMEN PELVIS W CONTRAST  Result Date: 09/06/2022 CLINICAL DATA:  Ovarian cancer.  Restaging.  * Tracking Code: BO * EXAM: CT ABDOMEN AND PELVIS WITH CONTRAST TECHNIQUE: Multidetector CT imaging of the abdomen and pelvis was performed using the standard protocol following bolus administration of intravenous contrast. RADIATION DOSE REDUCTION: This exam was performed according to the departmental dose-optimization program which includes automated exposure control,  adjustment of the mA and/or kV according to patient size and/or use of iterative reconstruction technique. CONTRAST:  OMNIPAQUE IOHEXOL 300 MG/ML  SOLN COMPARISON:  06/13/2022 FINDINGS: Lower chest: Unremarkable. Hepatobiliary: No suspicious focal abnormality within the liver parenchyma. There is no evidence for gallstones, gallbladder wall thickening, or pericholecystic fluid. No intrahepatic or extrahepatic biliary dilation. Pancreas: No pancreatic mass lesion evident. Diffuse prominence of the main pancreatic duct is stable measuring up to 3-4 mm diameter in the head of the pancreas. Spleen: No splenomegaly. No focal mass lesion. Adrenals/Urinary Tract: No adrenal nodule or mass. Right kidney unremarkable. A cluster of nonobstructing stones in the upper pole right kidney is again noted with dominant stone measuring about 6 mm in size. No evidence for hydroureter. The urinary bladder appears normal for the degree of distention. Stomach/Bowel: Tiny hiatal hernia. Stomach otherwise unremarkable. Duodenum is normally positioned as is the ligament of Treitz. No small bowel wall thickening. No small bowel dilatation. The terminal ileum is  normal. The appendix is normal. No gross colonic mass. No colonic wall thickening. Diverticular changes are noted in the left colon without evidence of diverticulitis. Vascular/Lymphatic: There is mild atherosclerotic calcification of the abdominal aorta without aneurysm. There is no gastrohepatic or hepatoduodenal ligament lymphadenopathy. No retroperitoneal or mesenteric lymphadenopathy. No pelvic sidewall lymphadenopathy. Reproductive: Right-sided adnexal mass measured previously at 4.2 x 3.8 cm is now 4.9 x 4.6 cm. Cystic and solid left adnexal lesion measured previously at 4.1 x 3.4 cm is now 3.2 x 2.9 cm. 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 The more anterior of the 2 index lesions measures 2.6 x 2.1 cm today compared to 2.4 x 1.6 cm  previously. The more posterior lesion is 2.4 x 1.4 cm today compared to 2.0 x 1.9 cm previously. 5 mm soft tissue nodule identified in the inferior paracolic gutter previously at 5 mm measures 10 mm today on image 51/2. Implants along the left posterior peritoneum adjacent to the psoas muscle and iliac vessels appear progressive. 1.6 x 1.3 cm lesion visible on 58/2 was 1.1 x 0.8 cm previously (remeasured). No evidence for ascites. Musculoskeletal: No worrisome lytic or sclerotic osseous abnormality. IMPRESSION: 1. Overall generalized appearance of mild disease progression. 2. Index right-sided adnexal mass has progressed since the prior with interval decrease in size of the cystic and solid left adnexal lesion. 3. Interval progression of index capsular implants along the medial right liver. 4. Interval progression of soft tissue nodule in the inferior right paracolic gutter and peritoneal implants in the lower left abdomen and pelvis. 5. No evidence for ascites. 6. Nonobstructing left renal stones. 7. Tiny hiatal hernia. 8. Left colonic diverticulosis without diverticulitis. 9.  Aortic Atherosclerosis (ICD10-I70.0). Electronically Signed   By: Kennith Center M.D.   On: 09/06/2022 07:57      09/15/2022 -  Chemotherapy   Patient is on Treatment Plan : OVARIAN Carboplatin (AUC 6) + Paclitaxel (175) q21d X 6 Cycles     11/16/2022 Imaging   CT ABDOMEN PELVIS W CONTRAST  Result Date: 11/16/2022 CLINICAL DATA:  Ovarian carcinoma. Assess treatment response. * Tracking Code: BO * EXAM: CT ABDOMEN AND PELVIS WITH CONTRAST TECHNIQUE: Multidetector CT imaging of the abdomen and pelvis was performed using the standard protocol following bolus administration of intravenous contrast. RADIATION DOSE REDUCTION: This exam was performed according to the departmental dose-optimization program which includes automated exposure control, adjustment of the mA and/or kV according to patient size and/or use of iterative reconstruction  technique. CONTRAST:  OMNIPAQUE IOHEXOL 300 MG/ML  SOLN COMPARISON:  None Available. FINDINGS: Lower chest: Lung bases are clear. Hepatobiliary: No focal hepatic lesion. Normal gallbladder. No biliary duct dilatation. Common bile duct is normal. Peritoneal implant described along the margin the RIGHT hepatic liver is much less conspicuous. Capsular lesion measuring 12 mm on image 14/2 is decreased from 26 mm. Pancreas: Pancreas is normal. No ductal dilatation. No pancreatic inflammation. Spleen: Normal spleen Adrenals/urinary tract: Adrenal glands normal. Multiple calculi within LEFT kidney without obstruction. Ureters and bladder normal. Stomach/Bowel: Stomach, small bowel, appendix, and cecum are normal. Multiple diverticula of the descending colon and sigmoid colon without acute inflammation. Vascular/Lymphatic: Abdominal aorta is normal caliber. No periportal or retroperitoneal adenopathy. No pelvic adenopathy. Reproductive: Solid enhancing mass in the deep RIGHT pelvis adjacent the rectum measures 4.0 x 4.3 cm compared to 4.6 x 4.9 cm. Small adjacent satellite nodule measuring 1.1 cm (image 63) is unchanged. Cystic lesion of the LEFT adnexa with mural  nodularity measures 3.0 x 2.4 cm compared to 3.2 x 2.9 cm. No new nodularity in the pelvis. Post hysterectomy. Other: Interval decrease in the peritoneal nodularity. For example peritoneal nodule adjacent to the cecum in the RIGHT iliac fossa measures 6 mm (image 43/2 compared to 10 mm on prior. Lesions on the LEFT iliac vessels less prominent. For example 7 mm lesion on 50/2 decreased from 16 mm. Musculoskeletal: No aggressive osseous lesion. IMPRESSION: 1. Interval decrease in size of RIGHT pelvic mass. 2. Interval decrease in size of LEFT adnexal cystic mass. 3. Interval decrease in size of peritoneal nodularity. 4. Interval decrease in size of perihepatic peritoneal implant. 5. No new metastatic disease. Electronically Signed   By: Genevive Bi M.D.    On: 11/16/2022 14:34        PHYSICAL EXAMINATION: ECOG PERFORMANCE STATUS: 1 - Symptomatic but completely ambulatory  Vitals:   12/29/22 0825  BP: 135/78  Pulse: 85  Resp: 18  Temp: 97.6 F (36.4 C)  SpO2: 100%   Filed Weights   12/29/22 0825  Weight: 140 lb 3.2 oz (63.6 kg)    GENERAL:alert, no distress and comfortable NEURO: alert & oriented x 3 with fluent speech, no focal motor/sensory deficits  LABORATORY DATA:  I have reviewed the data as listed    Component Value Date/Time   NA 139 12/29/2022 0743   K 3.7 12/29/2022 0743   CL 107 12/29/2022 0743   CO2 24 12/29/2022 0743   GLUCOSE 207 (H) 12/29/2022 0743   BUN 14 12/29/2022 0743   CREATININE 0.56 12/29/2022 0743   CALCIUM 8.8 (L) 12/29/2022 0743   PROT 6.6 12/29/2022 0743   ALBUMIN 4.2 12/29/2022 0743   AST 58 (H) 12/29/2022 0743   ALT 62 (H) 12/29/2022 0743   ALKPHOS 144 (H) 12/29/2022 0743   BILITOT 1.2 12/29/2022 0743   GFRNONAA >60 12/29/2022 0743   GFRAA >60 02/24/2020 0852    No results found for: "SPEP", "UPEP"  Lab Results  Component Value Date   WBC 4.9 12/29/2022   NEUTROABS 4.4 12/29/2022   HGB 10.3 (L) 12/29/2022   HCT 30.8 (L) 12/29/2022   MCV 106.2 (H) 12/29/2022   PLT 103 (L) 12/29/2022      Chemistry      Component Value Date/Time   NA 139 12/29/2022 0743   K 3.7 12/29/2022 0743   CL 107 12/29/2022 0743   CO2 24 12/29/2022 0743   BUN 14 12/29/2022 0743   CREATININE 0.56 12/29/2022 0743      Component Value Date/Time   CALCIUM 8.8 (L) 12/29/2022 0743   ALKPHOS 144 (H) 12/29/2022 0743   AST 58 (H) 12/29/2022 0743   ALT 62 (H) 12/29/2022 0743   BILITOT 1.2 12/29/2022 0743

## 2022-12-29 NOTE — Assessment & Plan Note (Signed)
she has mild peripheral neuropathy, likely related to side effects of treatment. It is only mild, not bothering the patient. She is currently on reduced dose treatment and we will continue the same I will observe for now If it gets worse in the future, I will consider modifying the dose of the treatment

## 2022-12-29 NOTE — Patient Instructions (Signed)
[facility]  Discharge Instructions: Thank you for choosing [facility] to provide your oncology and hematology care.   If you have a lab appointment with the Cancer Center, please go directly to the Cancer Center and check in at the registration area.   Wear comfortable clothing and clothing appropriate for easy access to any Portacath or PICC line.   We strive to give you quality time with your provider. You may need to reschedule your appointment if you arrive late (15 or more minutes).  Arriving late affects you and other patients whose appointments are after yours.  Also, if you miss three or more appointments without notifying the office, you may be dismissed from the clinic at the provider's discretion.      For prescription refill requests, have your pharmacy contact our office and allow 72 hours for refills to be completed.    Today you received the following chemotherapy and/or immunotherapy agents: Paclitaxel, Carboplatin.       To help prevent nausea and vomiting after your treatment, we encourage you to take your nausea medication as directed.  BELOW ARE SYMPTOMS THAT SHOULD BE REPORTED IMMEDIATELY: *FEVER GREATER THAN 100.4 F (38 C) OR HIGHER *CHILLS OR SWEATING *NAUSEA AND VOMITING THAT IS NOT CONTROLLED WITH YOUR NAUSEA MEDICATION *UNUSUAL SHORTNESS OF BREATH *UNUSUAL BRUISING OR BLEEDING *URINARY PROBLEMS (pain or burning when urinating, or frequent urination) *BOWEL PROBLEMS (unusual diarrhea, constipation, pain near the anus) TENDERNESS IN MOUTH AND THROAT WITH OR WITHOUT PRESENCE OF ULCERS (sore throat, sores in mouth, or a toothache) UNUSUAL RASH, SWELLING OR PAIN  UNUSUAL VAGINAL DISCHARGE OR ITCHING   Items with * indicate a potential emergency and should be followed up as soon as possible or go to the Emergency Department if any problems should occur.  Please show the CHEMOTHERAPY ALERT CARD or IMMUNOTHERAPY  ALERT CARD at check-in to the Emergency Department and triage nurse.  Should you have questions after your visit or need to cancel or reschedule your appointment, please contact [facility]  Dept: [phone]  and follow the prompts.  Office hours are 8:00 a.m. to 4:30 p.m. Monday - Friday. Please note that voicemails left after 4:00 p.m. may not be returned until the following business day.  We are closed weekends and major holidays. You have access to a nurse at all times for urgent questions. Please call the main number to the clinic Dept: [phone] and follow the prompts.   For any non-urgent questions, you may also contact your provider using MyChart. We now offer e-Visits for anyone 18 and older to request care online for non-urgent symptoms. For details visit [email or web address].   Also download the MyChart app! Go to the app store, search "MyChart", open the app, select [facility], and log in with your MyChart username and password.   

## 2022-12-29 NOTE — Progress Notes (Signed)
    DATE:  12/29/22                                        X CHEMO/IMMUNOTHERAPY REACTION           MD: [name]   AGENT/BLOOD PRODUCT RECEIVING TODAY:              carboplatin and paclitaxel   AGENT/BLOOD PRODUCT RECEIVING IMMEDIATELY PRIOR TO REACTION:          carboplatin   VS: BP:     136/77   P:       81       SPO2:       100% RA                BP:     141/82   P:       78       SPO2:       100% RA     REACTION(S):           chest pain and diaphoresis   PREMEDS:     aloxi 0.25 mg IV, emend 150 mg IV, Pepcid 20 mg IV, Decadron 10 mg IV, Ceterizine 10 mg IV   INTERVENTION: Pepcid 20 mg IV, solu-medrol 62 mg IV   Review of Systems  Review of Systems  Constitutional:  Positive for diaphoresis.  Cardiovascular:  Positive for chest pain.  All other systems reviewed and are negative.    Physical Exam  Physical Exam Vitals and nursing note reviewed.  Constitutional:      Appearance: She is not ill-appearing or toxic-appearing.  HENT:     Head: Normocephalic.  Eyes:     Conjunctiva/sclera: Conjunctivae normal.  Cardiovascular:     Rate and Rhythm: Normal rate and regular rhythm.     Pulses: Normal pulses.     Heart sounds: Normal heart sounds.  Pulmonary:     Effort: Pulmonary effort is normal.     Breath sounds: Normal breath sounds.  Abdominal:     General: There is no distension.  Musculoskeletal:     Cervical back: Normal range of motion.  Skin:    General: Skin is warm and dry.  Neurological:     Mental Status: She is alert.     OUTCOME:                Patient became symptomatic 15 minutes into carboplatin infusion. Emergency medications were administered as documented above. Oncologist notified and agrees to discontinue carboplatin infusion. On reassessment patient still having mild GERD sensation that is consistent with her typical GERD symptoms. She feels close to baseline. Engaged in shred decision making with patient for continued observation in the ED vs  discharging home. As diaphoresis has resolved and symptoms now are consistent with her typical GERD she is comfortable discharging home. Discussed strict ED precautions should symptoms worsen or new ones develop.

## 2022-12-29 NOTE — Assessment & Plan Note (Signed)
We discussed potential enrollment in clinical trial She will think about it

## 2022-12-29 NOTE — Assessment & Plan Note (Signed)
Overall, she tolerated treatment well except for mild pancytopenia and slight neuropathy She had excellent response to therapy The plan will be to proceed for few more cycles of treatment and with repeat imaging study in September before transitioning her to maintenance treatment

## 2022-12-29 NOTE — Progress Notes (Signed)
Nutrition Assessment   Reason for Assessment: Pt request    ASSESSMENT: 67 year old female with recurrent ovarian cancer. She is currently receiving carboplatin + taxol q21d. Patient is under the care of Dr. [name]  Last seen by RD 05/01/2019  Met with patient in infusion. Pt reports doing well overall. She has mild peripheral neuropathy which has improved with cold therapy during treatment. Pt asking how she can boost her immune system to help her body fight. Pt endorses good appetite. Typically eats 3 meals/day and tries to choose healthy options. Yesterday, recalls bowl of cereal with banana for breakfast, 1/2 turkey/cucumber sandwich, cantaloupe, tea for lunch, chick fila grilled chicken sandwich for dinner. She is drinking 64 ounces of water.    Nutrition Focused Physical Exam: deferred    Medications: xanax, lisinopril, zofran, compazine   Labs: glucose 207   Anthropometrics:   Height: 5'2" Weight: 140 lb 3.2 UBW: 137-147 lb (last 12 months) BMI: 25.64   NUTRITION DIAGNOSIS: Food and nutrition related knowledge deficit continues    INTERVENTION:  Educated on importance of adequate calories and protein to maintain strength/weights during treatment Educated on AICR recommendations for plant based diet for cancer prevention/recurrence Discussed challenges of plant based while undergoing treatment secondary to side effects experienced. Encouraged following only as tolerated  Suggested smoothies made with fruits/vegetables to increase daily servings  AICR pamphlet provided  Contact information given   MONITORING, EVALUATION, GOAL: Pt will tolerate adequate calories/protein to minimize wt loss during treatment    Next Visit: To be scheduled as needed

## 2022-12-29 NOTE — Progress Notes (Signed)
Hypersensitivity Reaction note  Date of event: 12/29/22 Time of event: 1350 Generic name of drug involved: Carboplatin Name of provider notified of the hypersensitivity reaction: Dr. [name], Daphane Shepherd, PA-C Was agent that likely caused hypersensitivity reaction added to Allergies List within EMR? Yes Chain of events including reaction signs/symptoms, treatment administered, and outcome (e.g., drug resumed; drug discontinued; sent to Emergency Department; etc.)  1350: 15 min into carboplatin infusion, pt c/o "acid-reflux-like" symptoms: burning in chest, mid-chest tightness.  Infusion paused.  Jae Dire, [place], called, en route.  Dr. [name] notified via Secure Chat.  VSS.    1353: Patient reported feeling hot, exhibited facial flushing.  1354: IVF administered.  1355: IV Pepcid administered per Jae Dire, [place].  See MAR.  Jae Dire at chairside.  1407: Solu-Medrol administered per Jae Dire, [place].  See MAR.  VSS, see Flowsheets.  1411: Pt reports lessening of symptoms.  Per Jae Dire and MD, patient to remain in observation until Sx resolved.  1515: Pt reports feeling slight tightness in chest, similar to what she experiences at home with acid reflux.  Jae Dire, [place], at chairside.  Pt agreeable to remaining until 1600 for observation.  1600: Pt reports feeling "mostly" better.  Per Jae Dire, pt instructed to seek emergency medical attention if Sx worsen or new Sx arise.  Patient verbalized understanding.  VSS.  Ambulated to lobby for ride home with husband.  Patient unable to complete carboplatin infusion.  Medication removed from Tx plan.      Bradd Burner, RN 12/29/2022 3:47 PM

## 2022-12-29 NOTE — Assessment & Plan Note (Signed)
This is likely due to side effects of treatment but she is not symptomatic Observe closely for now

## 2022-12-30 ENCOUNTER — Other Ambulatory Visit: Payer: Self-pay

## 2022-12-30 ENCOUNTER — Other Ambulatory Visit: Payer: Self-pay | Admitting: Hematology and Oncology

## 2022-12-30 ENCOUNTER — Telehealth: Payer: Self-pay | Admitting: *Deleted

## 2022-12-30 ENCOUNTER — Encounter: Payer: Self-pay | Admitting: Hematology and Oncology

## 2022-12-30 NOTE — Telephone Encounter (Signed)
Contacted patient to ask how she is feeling today. Ms. Stacy Rivas states that she doesn't feel any worse today than she usually feels following treatment other than a little more hoarse today. She states she is always hoarse after treatment, but today is a little worse than usual.   Ms. Stacy Rivas said she was sleepier than usually after treatment when she got home yesterday, so she went to bed and slept all night.  Advised her to drink plenty of fluids and rest as needed. Encouraged her to contact office for any concerns or questions. Ms. Stacy Rivas stated appreciation for call and thanked this Clinical research associate and Dr. [name] for checking on her.

## 2022-12-30 NOTE — Telephone Encounter (Signed)
-----   Message from Artis Delay sent at 12/30/2022  9:41 AM EDT ----- She had [place] reaction yesterday Can you call and check on her?

## 2023-01-18 ENCOUNTER — Other Ambulatory Visit: Payer: Self-pay | Admitting: Hematology and Oncology

## 2023-01-18 ENCOUNTER — Other Ambulatory Visit (HOSPITAL_COMMUNITY): Payer: Self-pay

## 2023-01-18 MED ORDER — DEXAMETHASONE 4 MG PO TABS
ORAL_TABLET | ORAL | 0 refills | Status: DC
  Filled 2023-01-18: qty 24, 84d supply, fill #0
  Filled 2023-01-18: qty 16, 84d supply, fill #0

## 2023-01-18 MED FILL — Fosaprepitant Dimeglumine For IV Infusion 150 MG (Base Eq): INTRAVENOUS | Qty: 5 | Status: AC

## 2023-01-18 MED FILL — Dexamethasone Sodium Phosphate Inj 100 MG/10ML: INTRAMUSCULAR | Qty: 1 | Status: AC

## 2023-01-19 ENCOUNTER — Encounter: Payer: Self-pay | Admitting: Hematology and Oncology

## 2023-01-19 ENCOUNTER — Inpatient Hospital Stay: Payer: Medicare Other

## 2023-01-19 ENCOUNTER — Inpatient Hospital Stay (HOSPITAL_BASED_OUTPATIENT_CLINIC_OR_DEPARTMENT_OTHER): Payer: Medicare Other | Admitting: Hematology and Oncology

## 2023-01-19 VITALS — BP 141/80 | HR 94 | Resp 18 | Ht 62.0 in | Wt 139.8 lb

## 2023-01-19 DIAGNOSIS — Z7189 Other specified counseling: Secondary | ICD-10-CM

## 2023-01-19 DIAGNOSIS — G620 Drug-induced polyneuropathy: Secondary | ICD-10-CM

## 2023-01-19 DIAGNOSIS — C562 Malignant neoplasm of left ovary: Secondary | ICD-10-CM

## 2023-01-19 DIAGNOSIS — D6481 Anemia due to antineoplastic chemotherapy: Secondary | ICD-10-CM

## 2023-01-19 DIAGNOSIS — T451X5A Adverse effect of antineoplastic and immunosuppressive drugs, initial encounter: Secondary | ICD-10-CM

## 2023-01-19 DIAGNOSIS — Z5111 Encounter for antineoplastic chemotherapy: Secondary | ICD-10-CM | POA: Diagnosis not present

## 2023-01-19 LAB — CBC WITH DIFFERENTIAL (CANCER CENTER ONLY)
Abs Immature Granulocytes: 0.01 10*3/uL (ref 0.00–0.07)
Basophils Absolute: 0 10*3/uL (ref 0.0–0.1)
Basophils Relative: 0 %
Eosinophils Absolute: 0 10*3/uL (ref 0.0–0.5)
Eosinophils Relative: 0 %
HCT: 33.1 % — ABNORMAL LOW (ref 36.0–46.0)
Hemoglobin: 11 g/dL — ABNORMAL LOW (ref 12.0–15.0)
Immature Granulocytes: 0 %
Lymphocytes Relative: 11 %
Lymphs Abs: 0.5 10*3/uL — ABNORMAL LOW (ref 0.7–4.0)
MCH: 35.6 pg — ABNORMAL HIGH (ref 26.0–34.0)
MCHC: 33.2 g/dL (ref 30.0–36.0)
MCV: 107.1 fL — ABNORMAL HIGH (ref 80.0–100.0)
Monocytes Absolute: 0.1 10*3/uL (ref 0.1–1.0)
Monocytes Relative: 2 %
Neutro Abs: 4.2 10*3/uL (ref 1.7–7.7)
Neutrophils Relative %: 87 %
Platelet Count: 198 10*3/uL (ref 150–400)
RBC: 3.09 MIL/uL — ABNORMAL LOW (ref 3.87–5.11)
RDW: 14.1 % (ref 11.5–15.5)
WBC Count: 4.8 10*3/uL (ref 4.0–10.5)
nRBC: 0 % (ref 0.0–0.2)

## 2023-01-19 LAB — CMP (CANCER CENTER ONLY)
ALT: 35 U/L (ref 0–44)
AST: 34 U/L (ref 15–41)
Albumin: 4.2 g/dL (ref 3.5–5.0)
Alkaline Phosphatase: 107 U/L (ref 38–126)
Anion gap: 7 (ref 5–15)
BUN: 13 mg/dL (ref 8–23)
CO2: 26 mmol/L (ref 22–32)
Calcium: 9.2 mg/dL (ref 8.9–10.3)
Chloride: 107 mmol/L (ref 98–111)
Creatinine: 0.49 mg/dL (ref 0.44–1.00)
GFR, Estimated: >60 mL/min (ref >60)
Glucose, Bld: 158 mg/dL — ABNORMAL HIGH (ref 70–99)
Potassium: 3.5 mmol/L (ref 3.5–5.1)
Sodium: 140 mmol/L (ref 135–145)
Total Bilirubin: 0.8 mg/dL (ref 0.3–1.2)
Total Protein: 6.7 g/dL (ref 6.5–8.1)

## 2023-01-19 MED ORDER — CETIRIZINE HCL 10 MG/ML IV SOLN
10.0000 mg | Freq: Once | INTRAVENOUS | Status: AC
  Administered 2023-01-19: 10 mg via INTRAVENOUS
  Filled 2023-01-19: qty 1

## 2023-01-19 MED ORDER — PALONOSETRON HCL INJECTION 0.25 MG/5ML
0.2500 mg | Freq: Once | INTRAVENOUS | Status: AC
  Administered 2023-01-19: 0.25 mg via INTRAVENOUS
  Filled 2023-01-19: qty 5

## 2023-01-19 MED ORDER — SODIUM CHLORIDE 0.9% FLUSH
10.0000 mL | INTRAVENOUS | Status: DC | PRN
  Administered 2023-01-19: 10 mL

## 2023-01-19 MED ORDER — HEPARIN SOD (PORK) LOCK FLUSH 100 UNIT/ML IV SOLN
500.0000 [IU] | Freq: Once | INTRAVENOUS | Status: AC | PRN
  Administered 2023-01-19: 500 [IU]

## 2023-01-19 MED ORDER — SODIUM CHLORIDE 0.9 % IV SOLN
150.0000 mg | Freq: Once | INTRAVENOUS | Status: AC
  Administered 2023-01-19: 150 mg via INTRAVENOUS
  Filled 2023-01-19: qty 150

## 2023-01-19 MED ORDER — SODIUM CHLORIDE 0.9 % IV SOLN
10.0000 mg | Freq: Once | INTRAVENOUS | Status: AC
  Administered 2023-01-19: 10 mg via INTRAVENOUS
  Filled 2023-01-19: qty 10

## 2023-01-19 MED ORDER — FAMOTIDINE IN NACL 20-0.9 MG/50ML-% IV SOLN
20.0000 mg | Freq: Once | INTRAVENOUS | Status: AC
  Administered 2023-01-19: 20 mg via INTRAVENOUS
  Filled 2023-01-19: qty 50

## 2023-01-19 MED ORDER — SODIUM CHLORIDE 0.9 % IV SOLN
105.0000 mg/m2 | Freq: Once | INTRAVENOUS | Status: AC
  Administered 2023-01-19: 174 mg via INTRAVENOUS
  Filled 2023-01-19: qty 29

## 2023-01-19 MED ORDER — SODIUM CHLORIDE 0.9 % IV SOLN: Freq: Once | INTRAVENOUS | Status: AC

## 2023-01-19 NOTE — Progress Notes (Signed)
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 It is only mild, not bothering the patient. She is currently on reduced dose treatment and we will continue the same I will observe for now If it gets worse in the future, I will consider modifying the dose of the treatment  Anemia due to antineoplastic chemotherapy This is likely due to recent treatment. The patient denies recent history of bleeding such as epistaxis, hematuria or hematochezia. She is asymptomatic from the anemia. I will observe for now.  She does not require transfusion now. I will continue the chemotherapy at current dose without dosage adjustment.  If the anemia gets progressive worse in the future, I might have to delay her treatment or adjust the chemotherapy dose.   Orders Placed This Encounter  Procedures   CT ABDOMEN PELVIS W CONTRAST    Standing Status:   Future    Standing Expiration Date:   01/19/2024    Scheduling Instructions:     No need oral contrast    Order Specific Question:   If indicated for the ordered procedure, I authorize the administration of contrast media per Radiology protocol    Answer:   Yes    Order Specific Question:   Does the patient have a contrast media/X-ray dye allergy?    Answer:   No    Order Specific Question:   Preferred imaging location?    Answer:   [facility]    Order Specific  Question:   If indicated for the ordered procedure, I authorize the administration of oral contrast media per Radiology protocol    Answer:   Yes    All questions were answered. The patient knows to call the clinic with any problems, questions or concerns. The total time spent in the appointment was 30 minutes encounter with patients including review of chart and various tests results, discussions about plan of care and coordination of care plan   Artis Delay, MD 01/19/2023 11:18 AM  INTERVAL HISTORY: Please see below for problem oriented charting. she returns for treatment follow-up  She developed carboplatin reaction last cycle She is doing well now She has excessive fatigue and slight worsening neuropathy but not debilitating We discussed test results, timing of next imaging and future plan of care  REVIEW OF SYSTEMS:   Constitutional: Denies fevers, chills or abnormal weight loss Eyes: Denies blurriness of vision Ears, nose, mouth, throat, and face: Denies mucositis or sore throat Respiratory: Denies cough, dyspnea or wheezes Cardiovascular: Denies palpitation, chest discomfort or lower extremity swelling Gastrointestinal:  Denies nausea, heartburn or change in bowel habits Skin: Denies abnormal skin rashes Lymphatics: Denies new lymphadenopathy or easy bruising Behavioral/Psych: Mood is stable, no new changes  All other systems were reviewed with the patient and are negative.  I have reviewed the past medical history, past surgical history, social history and family history with the patient and they are unchanged from previous note.  ALLERGIES:  is allergic to carboplatin, skin adhesives [cyanoacrylate], shrimp [shellfish allergy], and sulfonamide derivatives.  MEDICATIONS:  Current Outpatient Medications  Medication Sig Dispense Refill   ALPRAZolam (XANAX) 0.25 MG tablet Take 1 tablet by mouth at bedtime as needed for anxiety. 30 tablet 0   carboxymethylcellulose (REFRESH  PLUS) 0.5 % SOLN Place 1 drop into both eyes 2 (two) times daily as needed (dry eyes).     Cholecalciferol (VITAMIN D3) 25 MCG (1000 UT) CAPS Take 1,000 Units by mouth daily.      dexamethasone (DECADRON) 4 MG tablet Take 2 tablets by mouth the night before and 2 tablets the morning of chemotherapy, every 3 weeks, by mouth x 6 cycles 24 tablet 0   esomeprazole (NEXIUM) 20 MG capsule Take 20 mg by mouth daily.      estradiol (ESTRACE VAGINAL) 0.1 MG/GM vaginal cream Place 1 applicatorful vaginally 3 (three) times a week. 42.5 g 12   lidocaine-prilocaine (EMLA) cream Apply topically as needed. 30 g 0   lisinopril (ZESTRIL) 20 MG tablet Take 1 tablet (20 mg total) by mouth daily. 90 tablet 1   loratadine (CLARITIN) 10 MG tablet Take 10 mg by mouth daily as needed for allergies.     Multiple Vitamin (MULTIVITAMIN WITH MINERALS) TABS tablet Take 1 tablet by mouth daily.     ondansetron (ZOFRAN) 8 MG tablet Take 1 tablet by mouth 2 times daily as needed. Start on the third day after chemotherapy. 30 tablet 1   prochlorperazine (COMPAZINE) 10 MG tablet Take 1 tablet by mouth every 6 hours as needed (Nausea or vomiting). 30 tablet 1   No current facility-administered medications for this visit.   Facility-Administered Medications Ordered in Other Visits  Medication Dose Route Frequency Provider Last Rate Last Admin   heparin lock flush 100 unit/mL  500 Units Intracatheter Once PRN [name], Esias Mory, MD       PACLitaxel (TAXOL) 174 mg in sodium chloride 0.9 % 250 mL chemo infusion (> 80mg /m2)  105 mg/m2 (Treatment Plan Recorded) Intravenous Once Artis Delay, MD 93 mL/hr at 01/19/23 1106 174 mg at 01/19/23 1106   sodium chloride flush (NS) 0.9 % injection 10 mL  10 mL Intracatheter PRN Artis Delay, MD        SUMMARY OF ONCOLOGIC HISTORY: Oncology History Overview Note  Her 2 neu 1+ MSI stable, serous, ER 40% Negative genetics Progressed on niraparib and bevacizumab Folate receptor alpha neg PD-L1 CPS 1%    Left ovarian epithelial cancer (HCC)  11/09/2018 Imaging   1. 14.7 cm poorly defined soft tissue mass in central pelvis suspicious for primary ovarian carcinoma, with uterine leiomyosarcoma considered a less likely differential diagnosis. 2. Moderate ascites and diffuse peritoneal carcinomatosis. 3. Several small uterine fibroids.   11/13/2018 Tumor Marker   Patient's tumor was tested for the following markers: CA-125 Results of the tumor marker test revealed 1434   11/21/2018 Procedure   Successful ultrasound-guided diagnostic and therapeutic paracentesis yielding 3.1 liters of peritoneal fluid   11/21/2018 Pathology Results   PERITONEAL/ASCITIC FLUID(SPECIMEN 1 OF 1 COLLECTED 11/21/18): ADENOCARCINOMA. Specimen Clinical Information Pelvic mass suspicious for ovarian cancer Source Peritoneal/Ascitic Fluid, (specimen 1 of 1 collected 11/21/18) Gross Specimen: Received is/are 1000 ccs of dark amber fluid. (CM:cm) Prepared: # Smears: 0 # Concentration Technique Slides (i.e. 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   11/29/2018 Initial Diagnosis   Ovarian cancer (HCC)   11/29/2018 Cancer Staging  Staging form: Ovary, Fallopian Tube, and Primary Peritoneal Carcinoma, AJCC 8th Edition - Clinical: cT3, cN0, cM0 - Signed by Artis Delay, MD on 11/29/2018   12/03/2018 Procedure   Successful ultrasound-guided therapeutic paracentesis yielding 3.7 liters of peritoneal fluid.     12/06/2018 Procedure   Placement of a subcutaneous port device. Catheter tip at the SVC and right atrium junction   12/14/2018 Procedure   Successful ultrasound-guided paracentesis yielding 2.9 L of peritoneal fluid   12/28/2018 Tumor Marker   Patient's tumor was tested for the following markers: CA-125 Results of the tumor marker test revealed 812.   12/28/2018 - 04/22/2019 Chemotherapy   The patient had carboplatin and taxol  for neoadjuvant treatment, followed by interval debulking surgery and subsequent adjuvant chemotherapy treatment.     02/01/2019 Imaging   Ct abdomen and pelvis 8.6 cm left ovarian mass, corresponding to the patient's known primary neoplasm, improved.   Mild peritoneal nodularity/omental caking, improved.   Small abdominopelvic ascites, improved.     02/01/2019 Tumor Marker   Patient's tumor was tested for the following markers: CA-125 Results of the tumor marker test revealed 183.   02/12/2019 Surgery   Preoperative Diagnosis: Stage IIIC ovarian cancer, s/p neoadjuvant chemotherapy      Procedure(s) Performed: 1. Exploratory laparotomy with total abdominal hysterectomy, bilateral salpingo-oophorectomy, omentectomy radical tumor debulking for ovarian cancer.   Surgeon: Luisa Dago, MD.    Operative Findings: upper abdomen free of disease. No visible omental disease. Small volume (200cc) ascites. 8cm friable mass replacing left ovary and adherent to the sigmoid colon mesentery and ureter on the left.  Anterior fibroid. This represented an optimal cytoreduction (R0) with no gross visible disease remaining.    02/12/2019 Pathology Results   A. OVARY AND FALLOPIAN TUBE, LEFT, SALPINGO-OOPHORECTOMY: - Serous carcinoma, high grade, status post neoadjuvant therapy - See oncology table and comment below B. UTERUS CERVIX WITH RIGHT FALLOPIAN TUBE AND OVARY, HYSTERECTOMY: Uterus: - Serosal surface involved by serous carcinoma - Endomyometrium uninvolved by carcinoma - Benign endometrial polyp (4.1 cm) - Leiomyomata (5.5 cm; largest) - Adenomyosis Cervix: - Uninvolved by carcinoma Left ovary: - Serous carcinoma, high grade Left fallopian tube: - Serous carcinoma, high grade C. SOFT TISSUE, LEFT PELVIC SIDEWALL TUMOR, EXCISION: - Metastatic serous carcinoma, high-grade D. SOFT TISSUE, SIGMOID COLON MESENTERY, EXCISION: - Metastatic serous carcinoma, high-grade E. OMENTUM, TUMOR  RESECTION: - Metastatic serous carcinoma, high-grade OVARY or FALLOPIAN TUBE or PRIMARY PERITONEUM: Procedure: Total hysterectomy and bilateral salpingo-oophorectomy, omentectomy and peritoneal biopsies Specimen Integrity: Fragmented Tumor Site: Left ovary and fallopian tube Ovarian Surface Involvement: Present Fallopian Tube Surface Involvement: Present Tumor Size: 6.3 cm (see comment) Histologic Type: Serous carcinoma Histologic Grade: High-grade Implants: Not applicable Other Tissue/ Organ Involvement: Right ovary, right fallopian tube, omentum, mesentery Largest Extrapelvic Peritoneal Focus: Macroscopic Peritoneal/Ascitic Fluid: Malignant Treatment Effect: No definite or minimal response identified (chemotherapy response score 1 [CRS1] Regional Lymph Nodes: No lymph nodes submitted or found Pathologic Stage Classification (pTNM, AJCC 8th Edition): pT3c, pNX Representative Tumor Block: A4 Comment(s): Additional testing (HER-2, MMR and MSI) are pending. The primary tumor site appears to be the left ovary and fallopian tube. The uterus is only involved on the serosal surface. There is tumor on the anterior peritoneal reflection.  Addendum: Tumor is Her2 negative,MSI stable   02/19/2019 Tumor Marker   Patient's tumor was tested for the following markers: CA-125 Results of the tumor marker test revealed 40.3   04/01/2019 Tumor Marker   Patient's tumor was tested  for the following markers: CA-125 Results of the tumor marker test revealed 8.1    Genetic Testing   Negative testing. No pathogenic variants identified on the [employer]. The report date is 04/17/2019.  Somatic genes analyzed through [facility]: ATM, BARD1, BRCA1, BRCA2, BRIP1, CHEK2, MRE11A, NBN, PALB2, RAD51C, RAD51D.  The [employer] gene panel offered by [employer] includes sequencing and rearrangement analysis for the following 36 genes: APC*, ATM*, AXIN2, BARD1, BMPR1A, BRCA1*, BRCA2*,  BRIP1*, CDH1*, CDK4, CDKN2A, CHEK2*, DICER1, MLH1*, MSH2*, MSH3, MSH6*, MUTYH*, NBN, NF1*, NTHL1, PALB2*, PMS2*, PTEN*, RAD51C*, RAD51D*, RECQL, SMAD4, SMARCA4, STK11 and TP53* (sequencing and deletion/duplication); HOXB13, POLD1 and POLE (sequencing only); EPCAM and GREM1 (deletion/duplication only).    05/21/2019 Imaging   1. Interval hysterectomy, oophorectomy and omentectomy. No evidence of residual ovarian carcinoma in the pelvis. No evidence of peritoneal disease. No intraperitoneal free fluid. 2. Nonobstructing LEFT renal calculi.  Normal ureters.   05/22/2019 Tumor Marker   Patient's tumor was tested for the following markers: CA-125 Results of the tumor marker test revealed 5.7.   06/03/2019 Procedure   Successful right IJ vein Port-A-Cath explant.   08/26/2019 Tumor Marker   Patient's tumor was tested for the following markers: CA-125. Results of the tumor marker test revealed 4.9   10/03/2019 Imaging   1. New tumor deposits along the capsular surfaces of the liver, spleen, in the left pelvis, and right paracolic gutter, compatible with metastatic disease. 2. Mild dilation of the dorsal pancreatic duct in the pancreatic body and head, cause uncertain. 3. Nonobstructive left nephrolithiasis. 4. Aortic atherosclerosis.   Aortic Atherosclerosis (ICD10-I70.0   10/10/2019 Procedure   Successful placement of a right IJ approach Power Port with ultrasound and fluoroscopic guidance. The catheter is ready for use.     10/11/2019 Tumor Marker   Patient's tumor was tested for the following markers: CA-125 Results of the tumor marker test revealed 19.6.   10/14/2019 -  Chemotherapy   The patient had carboplatin and gemzar for chemotherapy treatment.     11/04/2019 Tumor Marker   Patient's tumor was tested for the following markers: CA-125 Results of the tumor marker test revealed 11.4   11/28/2019 Tumor Marker   Patient's tumor was tested for the following markers: CA-125 Results of  the tumor marker test revealed 5.8   12/20/2019 Imaging   1. Significant interval reduction in mixed solid and cystic nodule in the vicinity of the left ovary, with significant improvement or resolution of multiple pelvic, peritoneal, and organ capsule implants. Resolution of previously seen small volume ascites. Findings are consistent with treatment response of abdominal metastatic disease. 2. Status post hysterectomy, oophorectomy, and omentectomy. 3. Nonobstructive left nephrolithiasis. 4. Aortic Atherosclerosis (ICD10-I70.0).   12/23/2019 Tumor Marker   Patient's tumor was tested for the following markers: CA-125 Results of the tumor marker test revealed 5.0   01/24/2020 Tumor Marker   Patient's tumor was tested for the following markers: CA-125 Results of the tumor marker test revealed 4.8   02/24/2020 Tumor Marker   Patient's tumor was tested for the following markers: CA-125. Results of the tumor marker test revealed 4.3.   03/06/2020 Imaging   1. Status post hysterectomy, bilateral oophorectomy, and omentectomy. 2. Resolution of previously described left adnexal nodularity. 3. No residual disease identified. 4.  Aortic Atherosclerosis (ICD10-I70.0). 5. Left nephrolithiasis.   04/20/2020 Tumor Marker   Patient's tumor was tested for the following markers: CA-125 Results of the tumor marker test revealed 4.6   05/18/2020 Tumor Marker  Patient's tumor was tested for the following markers: CA-125 Results of the tumor marker test revealed 4.7   05/29/2020 Imaging   Status post hysterectomy and bilateral salpingo oophorectomy.   No evidence of recurrent or metastatic disease.   06/08/2020 - 09/13/2021 Chemotherapy   She was on niraparib       06/17/2020 Tumor Marker   Patient's tumor was tested for the following markers: CA-125 Results of the tumor marker test revealed 4.5   08/04/2020 Tumor Marker   Patient's tumor was tested for the following markers: CA-125. Results of  the tumor marker test revealed 5.7   09/07/2020 Imaging   1. Status post hysterectomy and bilateral salpingo oophorectomy. No evidence of recurrent or metastatic disease. 2. Nonobstructing left nephrolithiasis. 3. Left-sided colonic diverticulosis without findings of acute diverticulitis. 4. Aortic atherosclerosis.     09/07/2020 Tumor Marker   Patient's tumor was tested for the following markers: CA-125 Results of the tumor marker test revealed 4.8.   10/21/2020 Tumor Marker   Patient's tumor was tested for the following markers: CA-125. Results of the tumor marker test revealed 5.1.   12/14/2020 Tumor Marker   Patient's tumor was tested for the following markers: CA-125. Results of the tumor marker test revealed 5.5.   04/05/2021 Tumor Marker   Patient's tumor was tested for the following markers: CA-125. Results of the tumor marker test revealed 5.9.   05/31/2021 Tumor Marker   Patient's tumor was tested for the following markers: CA-125. Results of the tumor marker test revealed 8.1.   09/13/2021 Imaging   1. Interval development of multiple new cystic and solid lesions in the abdomen and pelvis measuring up to 10.3 x 7.2 cm and consistent with peritoneal metastatic disease. 2. Nonobstructing left renal stones. 3. Aortic Atherosclerosis (ICD10-I70.0).     09/14/2021 Tumor Marker   Patient's tumor was tested for the following markers: CA-125. Results of the tumor marker test revealed 28.5.   09/16/2021 Echocardiogram   1. Left ventricular ejection fraction, by estimation, is 60 to 65%. The left ventricle has normal function. The left ventricle has no regional wall motion abnormalities. Left ventricular diastolic parameters are consistent with Grade I diastolic dysfunction (impaired relaxation).  2. Right ventricular systolic function is normal. The right ventricular size is normal.  3. The mitral valve is normal in structure. Trivial mitral valve regurgitation. No evidence of  mitral stenosis.  4. The aortic valve has an indeterminant number of cusps. Aortic valve regurgitation is trivial. No aortic stenosis is present.  5. The inferior vena cava is normal in size with greater than 50% respiratory variability, suggesting right atrial pressure of 3 mmHg.     09/20/2021 - 01/17/2022 Chemotherapy   Patient is on Treatment Plan : OVARIAN RECURRENT Liposomal Doxorubicin + Carboplatin q28d X 6 Cycles     09/20/2021 - 02/22/2022 Chemotherapy   Patient is on Treatment Plan : OVARIAN RECURRENT Liposomal Doxorubicin + Carboplatin q28d X 6 Cycles     09/23/2021 Tumor Marker   Patient's tumor was tested for the following markers: CA-125. Results of the tumor marker test revealed 27.7.   11/24/2021 Tumor Marker   Patient's tumor was tested for the following markers: CA-125. Results of the tumor marker test revealed 28.4.   12/13/2021 Imaging   1. Overall stable peritoneal carcinomatosis. No new disease is demonstrated. 2. Moderate left-sided hydroureteronephrosis likely due to compression of the left ureter by the left pelvic mass. 3. Stable left-sided renal calculi. 4. Numerous left renal calculi.  12/22/2021 Tumor Marker   Patient's tumor was tested for the following markers: CA-125. Results of the tumor marker test revealed 24.7.   01/18/2022 Tumor Marker   Patient's tumor was tested for the following markers: CA-125. Results of the tumor marker test revealed 9.9.   01/27/2022 Echocardiogram    1. Left ventricular ejection fraction, by estimation, is 60 to 65%. The left ventricle has normal function. The left ventricle has no regional wall motion abnormalities. Left ventricular diastolic parameters are consistent with Grade I diastolic dysfunction (impaired relaxation). The average left ventricular global longitudinal strain is -21.1 %. The global longitudinal strain is normal.  2. Right ventricular systolic function is normal. The right ventricular size is normal.  3. The  mitral valve is abnormal. Mild mitral valve regurgitation.  4. The aortic valve is tricuspid. Aortic valve regurgitation is trivial.  5. The inferior vena cava is normal in size with greater than 50% respiratory variability, suggesting right atrial pressure of 3 mmHg.   02/23/2022 Tumor Marker   Patient's tumor was tested for the following markers: CA-125. Results of the tumor marker test revealed 8.5.   03/18/2022 Imaging   1. Diminished size of nodular and mixed solid and cystic peritoneal implants in the hepatorenal fossa, right lower quadrant, and left pelvis. 2. Unchanged solid mass in the right pelvis measuring 4.0 x 3.7 cm. 3. Constellation of findings is overall consistent with treatment response 4. Status post hysterectomy, oophorectomy, and omentectomy. 5. Nonobstructive left nephrolithiasis.   Aortic Atherosclerosis (ICD10-I70.0).     03/22/2022 - 08/16/2022 Chemotherapy   Patient is on Treatment Plan : Ovarian Bevacizumab q21d     03/24/2022 Tumor Marker   Patient's tumor was tested for the following markers: CA-125. Results of the tumor marker test revealed 7.1.   04/28/2022 Tumor Marker   Patient's tumor was tested for the following markers: CA-125. Results of the tumor marker test revealed 7.0.   05/27/2022 Tumor Marker   Patient's tumor was tested for the following markers: CA-125. Results of the tumor marker test revealed 7.7.   06/14/2022 Imaging   1. Slight response to therapy as evidenced by decrease in size of a cystic and solid mass in the left adnexa as well as peritoneal implants in the right paracolic gutter. Solid right adnexal mass and additional peritoneal implants along right hepatic lobe and in the left anatomic pelvis appear stable. 2. Hepatic steatosis. 3. Left renal stones. 4.  Aortic atherosclerosis (ICD10-I70.0).   06/17/2022 Tumor Marker   Patient's tumor was tested for the following markers: CA-125. Results of the tumor marker test revealed  11.2.   09/05/2022 Imaging   CT ABDOMEN PELVIS W CONTRAST  Result Date: 09/06/2022 CLINICAL DATA:  Ovarian cancer.  Restaging.  * Tracking Code: BO * EXAM: CT ABDOMEN AND PELVIS WITH CONTRAST TECHNIQUE: Multidetector CT imaging of the abdomen and pelvis was performed using the standard protocol following bolus administration of intravenous contrast. RADIATION DOSE REDUCTION: This exam was performed according to the departmental dose-optimization program which includes automated exposure control, adjustment of the mA and/or kV according to patient size and/or use of iterative reconstruction technique. CONTRAST:  OMNIPAQUE IOHEXOL 300 MG/ML  SOLN COMPARISON:  06/13/2022 FINDINGS: Lower chest: Unremarkable. Hepatobiliary: No suspicious focal abnormality within the liver parenchyma. There is no evidence for gallstones, gallbladder wall thickening, or pericholecystic fluid. No intrahepatic or extrahepatic biliary dilation. Pancreas: No pancreatic mass lesion evident. Diffuse prominence of the main pancreatic duct is stable measuring up to 3-4 mm  diameter in the head of the pancreas. Spleen: No splenomegaly. No focal mass lesion. Adrenals/Urinary Tract: No adrenal nodule or mass. Right kidney unremarkable. A cluster of nonobstructing stones in the upper pole right kidney is again noted with dominant stone measuring about 6 mm in size. No evidence for hydroureter. The urinary bladder appears normal for the degree of distention. Stomach/Bowel: Tiny hiatal hernia. Stomach otherwise unremarkable. Duodenum is normally positioned as is the ligament of Treitz. No small bowel wall thickening. No small bowel dilatation. The terminal ileum is normal. The appendix is normal. No gross colonic mass. No colonic wall thickening. Diverticular changes are noted in the left colon without evidence of diverticulitis. Vascular/Lymphatic: There is mild atherosclerotic calcification of the abdominal aorta without aneurysm. There is no  gastrohepatic or hepatoduodenal ligament lymphadenopathy. No retroperitoneal or mesenteric lymphadenopathy. No pelvic sidewall lymphadenopathy. Reproductive: Right-sided adnexal mass measured previously at 4.2 x 3.8 cm is now 4.9 x 4.6 cm. Cystic and solid left adnexal lesion measured previously at 4.1 x 3.4 cm is now 3.2 x 2.9 cm. 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 The more anterior of the 2 index lesions measures 2.6 x 2.1 cm today compared to 2.4 x 1.6 cm previously. The more posterior lesion is 2.4 x 1.4 cm today compared to 2.0 x 1.9 cm previously. 5 mm soft tissue nodule identified in the inferior paracolic gutter previously at 5 mm measures 10 mm today on image 51/2. Implants along the left posterior peritoneum adjacent to the psoas muscle and iliac vessels appear progressive. 1.6 x 1.3 cm lesion visible on 58/2 was 1.1 x 0.8 cm previously (remeasured). No evidence for ascites. Musculoskeletal: No worrisome lytic or sclerotic osseous abnormality. IMPRESSION: 1. Overall generalized appearance of mild disease progression. 2. Index right-sided adnexal mass has progressed since the prior with interval decrease in size of the cystic and solid left adnexal lesion. 3. Interval progression of index capsular implants along the medial right liver. 4. Interval progression of soft tissue nodule in the inferior right paracolic gutter and peritoneal implants in the lower left abdomen and pelvis. 5. No evidence for ascites. 6. Nonobstructing left renal stones. 7. Tiny hiatal hernia. 8. Left colonic diverticulosis without diverticulitis. 9.  Aortic Atherosclerosis (ICD10-I70.0). Electronically Signed   By: Kennith Center M.D.   On: 09/06/2022 07:57      09/15/2022 -  Chemotherapy   Patient is on Treatment Plan : OVARIAN Paclitaxel (175) q21d X 6 Cycles     11/16/2022 Imaging   CT ABDOMEN PELVIS W CONTRAST  Result Date: 11/16/2022 CLINICAL DATA:  Ovarian carcinoma. Assess treatment  response. * Tracking Code: BO * EXAM: CT ABDOMEN AND PELVIS WITH CONTRAST TECHNIQUE: Multidetector CT imaging of the abdomen and pelvis was performed using the standard protocol following bolus administration of intravenous contrast. RADIATION DOSE REDUCTION: This exam was performed according to the departmental dose-optimization program which includes automated exposure control, adjustment of the mA and/or kV according to patient size and/or use of iterative reconstruction technique. CONTRAST:  OMNIPAQUE IOHEXOL 300 MG/ML  SOLN COMPARISON:  None Available. FINDINGS: Lower chest: Lung bases are clear. Hepatobiliary: No focal hepatic lesion. Normal gallbladder. No biliary duct dilatation. Common bile duct is normal. Peritoneal implant described along the margin the RIGHT hepatic liver is much less conspicuous. Capsular lesion measuring 12 mm on image 14/2 is decreased from 26 mm. Pancreas: Pancreas is normal. No ductal dilatation. No pancreatic inflammation. Spleen: Normal spleen Adrenals/urinary tract: Adrenal glands normal. Multiple  calculi within LEFT kidney without obstruction. Ureters and bladder normal. Stomach/Bowel: Stomach, small bowel, appendix, and cecum are normal. Multiple diverticula of the descending colon and sigmoid colon without acute inflammation. Vascular/Lymphatic: Abdominal aorta is normal caliber. No periportal or retroperitoneal adenopathy. No pelvic adenopathy. Reproductive: Solid enhancing mass in the deep RIGHT pelvis adjacent the rectum measures 4.0 x 4.3 cm compared to 4.6 x 4.9 cm. Small adjacent satellite nodule measuring 1.1 cm (image 63) is unchanged. Cystic lesion of the LEFT adnexa with mural nodularity measures 3.0 x 2.4 cm compared to 3.2 x 2.9 cm. No new nodularity in the pelvis. Post hysterectomy. Other: Interval decrease in the peritoneal nodularity. For example peritoneal nodule adjacent to the cecum in the RIGHT iliac fossa measures 6 mm (image 43/2 compared to 10 mm on  prior. Lesions on the LEFT iliac vessels less prominent. For example 7 mm lesion on 50/2 decreased from 16 mm. Musculoskeletal: No aggressive osseous lesion. IMPRESSION: 1. Interval decrease in size of RIGHT pelvic mass. 2. Interval decrease in size of LEFT adnexal cystic mass. 3. Interval decrease in size of peritoneal nodularity. 4. Interval decrease in size of perihepatic peritoneal implant. 5. No new metastatic disease. Electronically Signed   By: Genevive Bi M.D.   On: 11/16/2022 14:34        PHYSICAL EXAMINATION: ECOG PERFORMANCE STATUS: 1 - Symptomatic but completely ambulatory  Vitals:   01/19/23 0818  BP: (!) 141/80  Pulse: 94  Resp: 18  SpO2: 100%   Filed Weights   01/19/23 0818  Weight: 139 lb 12.8 oz (63.4 kg)    GENERAL:alert, no distress and comfortable NEURO: alert & oriented x 3 with fluent speech, no focal motor/sensory deficits  LABORATORY DATA:  I have reviewed the data as listed    Component Value Date/Time   NA 140 01/19/2023 0756   K 3.5 01/19/2023 0756   CL 107 01/19/2023 0756   CO2 26 01/19/2023 0756   GLUCOSE 158 (H) 01/19/2023 0756   BUN 13 01/19/2023 0756   CREATININE 0.49 01/19/2023 0756   CALCIUM 9.2 01/19/2023 0756   PROT 6.7 01/19/2023 0756   ALBUMIN 4.2 01/19/2023 0756   AST 34 01/19/2023 0756   ALT 35 01/19/2023 0756   ALKPHOS 107 01/19/2023 0756   BILITOT 0.8 01/19/2023 0756   GFRNONAA >60 01/19/2023 0756   GFRAA >60 02/24/2020 0852    No results found for: "SPEP", "UPEP"  Lab Results  Component Value Date   WBC 4.8 01/19/2023   NEUTROABS 4.2 01/19/2023   HGB 11.0 (L) 01/19/2023   HCT 33.1 (L) 01/19/2023   MCV 107.1 (H) 01/19/2023   PLT 198 01/19/2023      Chemistry      Component Value Date/Time   NA 140 01/19/2023 0756   K 3.5 01/19/2023 0756   CL 107 01/19/2023 0756   CO2 26 01/19/2023 0756   BUN 13 01/19/2023 0756   CREATININE 0.49 01/19/2023 0756      Component Value Date/Time   CALCIUM 9.2 01/19/2023 0756    ALKPHOS 107 01/19/2023 0756   AST 34 01/19/2023 0756   ALT 35 01/19/2023 0756   BILITOT 0.8 01/19/2023 0756

## 2023-01-19 NOTE — Patient Instructions (Signed)

## 2023-01-19 NOTE — Assessment & Plan Note (Signed)
 she has mild peripheral neuropathy, likely related to side effects of treatment. It is only mild, not bothering the patient. She is currently on reduced dose treatment and we will continue the same I will observe for now If it gets worse in the future, I will consider modifying the dose of the treatment

## 2023-01-19 NOTE — Assessment & Plan Note (Signed)
This is likely due to recent treatment. The patient denies recent history of bleeding such as epistaxis, hematuria or hematochezia. She is asymptomatic from the anemia. I will observe for now.  She does not require transfusion now. I will continue the chemotherapy at current dose without dosage adjustment.  If the anemia gets progressive worse in the future, I might have to delay her treatment or adjust the chemotherapy dose.  

## 2023-01-19 NOTE — Assessment & Plan Note (Signed)
Her previous CT imaging showed excellent response to therapy Due to carboplatin reaction, I have omitted carboplatin and she will be receiving paclitaxel only She is starting to feel more fatigue and neuropathy After today's dose, I plan to order CT imaging to assess response to therapy

## 2023-01-25 ENCOUNTER — Encounter: Payer: Self-pay | Admitting: Hematology and Oncology

## 2023-01-31 ENCOUNTER — Ambulatory Visit (HOSPITAL_COMMUNITY)
Admission: RE | Admit: 2023-01-31 | Discharge: 2023-01-31 | Disposition: A | Discharge status: Home / Self Care | Payer: Medicare Other | Source: Ambulatory Visit | Attending: Hematology and Oncology | Admitting: Hematology and Oncology

## 2023-01-31 DIAGNOSIS — C562 Malignant neoplasm of left ovary: Secondary | ICD-10-CM | POA: Insufficient documentation

## 2023-01-31 MED ORDER — IOHEXOL 300 MG/ML  SOLN
100.0000 mL | Freq: Once | INTRAMUSCULAR | Status: AC | PRN
  Administered 2023-01-31: 100 mL via INTRAVENOUS

## 2023-02-08 ENCOUNTER — Telehealth: Payer: Self-pay | Admitting: Oncology

## 2023-02-08 ENCOUNTER — Other Ambulatory Visit: Payer: Self-pay

## 2023-02-08 ENCOUNTER — Telehealth: Payer: Self-pay

## 2023-02-08 MED FILL — Fosaprepitant Dimeglumine For IV Infusion 150 MG (Base Eq): INTRAVENOUS | Qty: 5 | Status: AC

## 2023-02-08 MED FILL — Dexamethasone Sodium Phosphate Inj 100 MG/10ML: INTRAMUSCULAR | Qty: 1 | Status: AC

## 2023-02-08 NOTE — Telephone Encounter (Signed)
Called [place] and let her know that her CT results indicated a positive response to chemo.  Advised Dr. [name] would like her to take her steroids tonight and will review the scan with her tomorrow.  Stacy Rivas verbalized understanding and agreement.

## 2023-02-08 NOTE — Telephone Encounter (Signed)
Stacy Rivas,  I have not seen the pictures yet but CT indicated positive response to chemo She can take her steroids tonight and I will review with her tomorrow

## 2023-02-08 NOTE — Telephone Encounter (Signed)
Called Mrs. Penning back to let her know that we are still waiting on scan results. She wants to know if she needs to take steroids tonight and tomorrow morning.  Message sent to Dr. [name] to inquire.  [facility] to have scan read bumped up since she has an appointment tomorrow. Lorayne Marek, RN

## 2023-02-09 ENCOUNTER — Inpatient Hospital Stay (HOSPITAL_BASED_OUTPATIENT_CLINIC_OR_DEPARTMENT_OTHER): Payer: Medicare Other | Admitting: Hematology and Oncology

## 2023-02-09 ENCOUNTER — Inpatient Hospital Stay: Payer: Medicare Other

## 2023-02-09 ENCOUNTER — Encounter: Payer: Self-pay | Admitting: Hematology and Oncology

## 2023-02-09 ENCOUNTER — Inpatient Hospital Stay: Payer: Medicare Other | Attending: Gynecologic Oncology

## 2023-02-09 VITALS — BP 136/86 | HR 93 | Temp 97.6°F | Resp 18 | Ht 62.0 in | Wt 141.4 lb

## 2023-02-09 DIAGNOSIS — R5383 Other fatigue: Secondary | ICD-10-CM | POA: Diagnosis not present

## 2023-02-09 DIAGNOSIS — T451X5A Adverse effect of antineoplastic and immunosuppressive drugs, initial encounter: Secondary | ICD-10-CM

## 2023-02-09 DIAGNOSIS — Z7189 Other specified counseling: Secondary | ICD-10-CM

## 2023-02-09 DIAGNOSIS — C562 Malignant neoplasm of left ovary: Secondary | ICD-10-CM | POA: Diagnosis not present

## 2023-02-09 DIAGNOSIS — C786 Secondary malignant neoplasm of retroperitoneum and peritoneum: Secondary | ICD-10-CM | POA: Diagnosis not present

## 2023-02-09 DIAGNOSIS — C563 Malignant neoplasm of bilateral ovaries: Secondary | ICD-10-CM | POA: Insufficient documentation

## 2023-02-09 DIAGNOSIS — C785 Secondary malignant neoplasm of large intestine and rectum: Secondary | ICD-10-CM | POA: Diagnosis not present

## 2023-02-09 DIAGNOSIS — Z5111 Encounter for antineoplastic chemotherapy: Secondary | ICD-10-CM | POA: Diagnosis present

## 2023-02-09 LAB — CMP (CANCER CENTER ONLY)
ALT: 78 U/L — ABNORMAL HIGH (ref 0–44)
AST: 64 U/L — ABNORMAL HIGH (ref 15–41)
Albumin: 4.3 g/dL (ref 3.5–5.0)
Alkaline Phosphatase: 136 U/L — ABNORMAL HIGH (ref 38–126)
Anion gap: 9 (ref 5–15)
BUN: 14 mg/dL (ref 8–23)
CO2: 26 mmol/L (ref 22–32)
Calcium: 9.5 mg/dL (ref 8.9–10.3)
Chloride: 107 mmol/L (ref 98–111)
Creatinine: 0.55 mg/dL (ref 0.44–1.00)
GFR, Estimated: >60 mL/min (ref >60)
Glucose, Bld: 187 mg/dL — ABNORMAL HIGH (ref 70–99)
Potassium: 3.6 mmol/L (ref 3.5–5.1)
Sodium: 142 mmol/L (ref 135–145)
Total Bilirubin: 0.9 mg/dL (ref 0.3–1.2)
Total Protein: 6.8 g/dL (ref 6.5–8.1)

## 2023-02-09 LAB — CBC WITH DIFFERENTIAL (CANCER CENTER ONLY)
Abs Immature Granulocytes: 0.06 10*3/uL (ref 0.00–0.07)
Basophils Absolute: 0 10*3/uL (ref 0.0–0.1)
Basophils Relative: 0 %
Eosinophils Absolute: 0 10*3/uL (ref 0.0–0.5)
Eosinophils Relative: 0 %
HCT: 36 % (ref 36.0–46.0)
Hemoglobin: 12.1 g/dL (ref 12.0–15.0)
Immature Granulocytes: 1 %
Lymphocytes Relative: 9 %
Lymphs Abs: 0.6 10*3/uL — ABNORMAL LOW (ref 0.7–4.0)
MCH: 34.8 pg — ABNORMAL HIGH (ref 26.0–34.0)
MCHC: 33.6 g/dL (ref 30.0–36.0)
MCV: 103.4 fL — ABNORMAL HIGH (ref 80.0–100.0)
Monocytes Absolute: 0.1 10*3/uL (ref 0.1–1.0)
Monocytes Relative: 1 %
Neutro Abs: 6.3 10*3/uL (ref 1.7–7.7)
Neutrophils Relative %: 89 %
Platelet Count: 209 10*3/uL (ref 150–400)
RBC: 3.48 MIL/uL — ABNORMAL LOW (ref 3.87–5.11)
RDW: 13 % (ref 11.5–15.5)
WBC Count: 7.1 10*3/uL (ref 4.0–10.5)
nRBC: 0 % (ref 0.0–0.2)

## 2023-02-09 MED ORDER — SODIUM CHLORIDE 0.9 % IV SOLN
150.0000 mg | Freq: Once | INTRAVENOUS | Status: AC
  Administered 2023-02-09: 150 mg via INTRAVENOUS
  Filled 2023-02-09: qty 150

## 2023-02-09 MED ORDER — SODIUM CHLORIDE 0.9% FLUSH
10.0000 mL | INTRAVENOUS | Status: DC | PRN
  Administered 2023-02-09: 10 mL

## 2023-02-09 MED ORDER — HEPARIN SOD (PORK) LOCK FLUSH 100 UNIT/ML IV SOLN
500.0000 [IU] | Freq: Once | INTRAVENOUS | Status: AC | PRN
  Administered 2023-02-09: 500 [IU]

## 2023-02-09 MED ORDER — SODIUM CHLORIDE 0.9 % IV SOLN
10.0000 mg | Freq: Once | INTRAVENOUS | Status: AC
  Administered 2023-02-09: 10 mg via INTRAVENOUS
  Filled 2023-02-09: qty 10

## 2023-02-09 MED ORDER — FAMOTIDINE IN NACL 20-0.9 MG/50ML-% IV SOLN
20.0000 mg | Freq: Once | INTRAVENOUS | Status: AC
  Administered 2023-02-09: 20 mg via INTRAVENOUS
  Filled 2023-02-09: qty 50

## 2023-02-09 MED ORDER — SODIUM CHLORIDE 0.9% FLUSH
10.0000 mL | Freq: Once | INTRAVENOUS | Status: AC
  Administered 2023-02-09: 10 mL

## 2023-02-09 MED ORDER — PALONOSETRON HCL INJECTION 0.25 MG/5ML
0.2500 mg | Freq: Once | INTRAVENOUS | Status: AC
  Administered 2023-02-09: 0.25 mg via INTRAVENOUS
  Filled 2023-02-09: qty 5

## 2023-02-09 MED ORDER — SODIUM CHLORIDE 0.9 % IV SOLN
105.0000 mg/m2 | Freq: Once | INTRAVENOUS | Status: AC
  Administered 2023-02-09: 174 mg via INTRAVENOUS
  Filled 2023-02-09: qty 29

## 2023-02-09 MED ORDER — SODIUM CHLORIDE 0.9 % IV SOLN: Freq: Once | INTRAVENOUS | Status: AC

## 2023-02-09 MED ORDER — CETIRIZINE HCL 10 MG/ML IV SOLN
10.0000 mg | Freq: Once | INTRAVENOUS | Status: AC
  Administered 2023-02-09: 10 mg via INTRAVENOUS
  Filled 2023-02-09: qty 1

## 2023-02-09 NOTE — Patient Instructions (Signed)
[facility]  Discharge Instructions: Thank you for choosing [facility] to provide your oncology and hematology care.   If you have a lab appointment with the Cancer Center, please go directly to the Cancer Center and check in at the registration area.   Wear comfortable clothing and clothing appropriate for easy access to any Portacath or PICC line.   We strive to give you quality time with your provider. You may need to reschedule your appointment if you arrive late (15 or more minutes).  Arriving late affects you and other patients whose appointments are after yours.  Also, if you miss three or more appointments without notifying the office, you may be dismissed from the clinic at the provider's discretion.      For prescription refill requests, have your pharmacy contact our office and allow 72 hours for refills to be completed.    Today you received the following chemotherapy and/or immunotherapy agents Paclitaxel      To help prevent nausea and vomiting after your treatment, we encourage you to take your nausea medication as directed.  BELOW ARE SYMPTOMS THAT SHOULD BE REPORTED IMMEDIATELY: *FEVER GREATER THAN 100.4 F (38 C) OR HIGHER *CHILLS OR SWEATING *NAUSEA AND VOMITING THAT IS NOT CONTROLLED WITH YOUR NAUSEA MEDICATION *UNUSUAL SHORTNESS OF BREATH *UNUSUAL BRUISING OR BLEEDING *URINARY PROBLEMS (pain or burning when urinating, or frequent urination) *BOWEL PROBLEMS (unusual diarrhea, constipation, pain near the anus) TENDERNESS IN MOUTH AND THROAT WITH OR WITHOUT PRESENCE OF ULCERS (sore throat, sores in mouth, or a toothache) UNUSUAL RASH, SWELLING OR PAIN  UNUSUAL VAGINAL DISCHARGE OR ITCHING   Items with * indicate a potential emergency and should be followed up as soon as possible or go to the Emergency Department if any problems should occur.  Please show the CHEMOTHERAPY ALERT CARD or IMMUNOTHERAPY ALERT CARD at  check-in to the Emergency Department and triage nurse.  Should you have questions after your visit or need to cancel or reschedule your appointment, please contact [facility]  Dept: [phone]  and follow the prompts.  Office hours are 8:00 a.m. to 4:30 p.m. Monday - Friday. Please note that voicemails left after 4:00 p.m. may not be returned until the following business day.  We are closed weekends and major holidays. You have access to a nurse at all times for urgent questions. Please call the main number to the clinic Dept: [phone] and follow the prompts.   For any non-urgent questions, you may also contact your provider using MyChart. We now offer e-Visits for anyone 18 and older to request care online for non-urgent symptoms. For details visit [email or web address].   Also download the MyChart app! Go to the app store, search "MyChart", open the app, select [place], and log in with your MyChart username and password.   

## 2023-02-09 NOTE — Progress Notes (Signed)
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[facility]) I have reviewed multiple imaging studies with the patient Overall, she has positive response to therapy Due to reaction to carboplatin, she will be getting single agent paclitaxel only We discussed the risk and benefits of continuing paclitaxel every 3 weeks versus weekly paclitaxel For now, we will continue current treatment as scheduled We discussed future follow-up as well as other treatment options available in the future Plan to repeat imaging study again in December  Chemotherapy-induced fatigue She continues to experience fatigue We discussed importance of regular exercise We discussed risk and benefits for her to retire versus continue working  Orders Placed This Encounter  Procedures   CBC with Differential (Cancer Center Only)    Standing Status:   Future    Standing Expiration Date:   03/01/2024   CMP (Cancer Center only)    Standing Status:   Future    Standing Expiration Date:   03/01/2024   CBC with Differential (Cancer Center Only)    Standing Status:   Future    Standing Expiration Date:   03/22/2024   CMP (Cancer Center only)    Standing Status:   Future    Standing Expiration Date:   03/22/2024    All questions were answered. The patient knows to call the clinic with any problems, questions or concerns. The total time spent in the appointment was 40 minutes encounter with patients including review of chart and various tests results, discussions about plan of care and coordination of care plan   Artis Delay, MD 02/09/2023 10:51 AM  INTERVAL HISTORY: Please see below for problem oriented charting. she returns for treatment follow-up and review of CT imaging results She denies peripheral neuropathy from treatment She is concerned about  future follow-up and plan of care We review her imaging studies extensively Overall, she has positive response to therapy We discussed future direction and importance of regular exercise  REVIEW OF SYSTEMS:   Constitutional: Denies fevers, chills or abnormal weight loss Eyes: Denies blurriness of vision Ears, nose, mouth, throat, and face: Denies mucositis or sore throat Respiratory: Denies cough, dyspnea or wheezes Cardiovascular: Denies palpitation, chest discomfort or lower extremity swelling Gastrointestinal:  Denies nausea, heartburn or change in bowel habits Skin: Denies abnormal skin rashes Lymphatics: Denies new lymphadenopathy or easy bruising Behavioral/Psych: Mood is stable, no new changes  All other systems were reviewed with the patient and are negative.  I have reviewed the past medical history, past surgical history, social history and family history with the patient and they are unchanged from previous note.  ALLERGIES:  is allergic to carboplatin, skin adhesives [cyanoacrylate], shrimp [shellfish allergy], sulfasalazine, sulfonamide derivatives, and telithromycin.  MEDICATIONS:  Current Outpatient Medications  Medication Sig Dispense Refill   ALPRAZolam (XANAX) 0.25 MG tablet Take 1 tablet by mouth at bedtime as needed for anxiety. 30 tablet 0   Calcium Citrate (CAL-CITRATE PO) 1 poq     Calcium-Magnesium-Vitamin D 200-100-33.3 MG-MG-UNIT CAPS take one by mouth as needed Oral     carboxymethylcellulose (REFRESH PLUS) 0.5 % SOLN Place 1 drop into both eyes 2 (two) times daily as needed (dry eyes).     Cholecalciferol (VITAMIN D3) 25 MCG (1000 UT) CAPS Take 1,000 Units by mouth daily.      dexamethasone (DECADRON) 4 MG tablet Take 2  tablets by mouth the night before and 2 tablets the morning of chemotherapy, every 3 weeks, by mouth x 6 cycles 24 tablet 0   diphenhydrAMINE (BENADRYL ALLERGY) 25 MG tablet Take by mouth.     escitalopram (LEXAPRO) 10 MG tablet 1 po qd Oral      esomeprazole (NEXIUM) 20 MG capsule Take 20 mg by mouth daily.      estradiol (ESTRACE VAGINAL) 0.1 MG/GM vaginal cream Place 1 applicatorful vaginally 3 (three) times a week. 42.5 g 12   furosemide (LASIX) 20 MG tablet 1 tablet Orally Once a day     hydrALAZINE (APRESOLINE) 10 MG tablet 1 tablet with food Orally two times a day     lidocaine-prilocaine (EMLA) cream Apply topically as needed. 30 g 0   lisinopril (ZESTRIL) 20 MG tablet Take 1 tablet (20 mg total) by mouth daily. 90 tablet 1   loratadine (CLARITIN) 10 MG tablet Take 10 mg by mouth daily as needed for allergies.     Multiple Vitamins-Minerals (MULTI FOR HER PO) 1 poq     mupirocin ointment (BACTROBAN) 2 % 1 application Externally Twice a day     niraparib tosylate (ZEJULA) 100 MG tablet 2 capsules Orally Once a day     ondansetron (ZOFRAN) 8 MG tablet Take 1 tablet by mouth 2 times daily as needed. Start on the third day after chemotherapy. 30 tablet 1   oxyCODONE-acetaminophen (PERCOCET/ROXICET) 5-325 MG tablet      pantoprazole (PROTONIX) 40 MG tablet 1 po qd on an empty stomach Oral     prochlorperazine (COMPAZINE) 10 MG tablet Take 1 tablet by mouth every 6 hours as needed (Nausea or vomiting). 30 tablet 1   No current facility-administered medications for this visit.   Facility-Administered Medications Ordered in Other Visits  Medication Dose Route Frequency Provider Last Rate Last Admin   heparin lock flush 100 unit/mL  500 Units Intracatheter Once PRN [name], Remedy Corporan, MD       PACLitaxel (TAXOL) 174 mg in sodium chloride 0.9 % 250 mL chemo infusion (> 80mg /m2)  105 mg/m2 (Treatment Plan Recorded) Intravenous Once Artis Delay, MD 93 mL/hr at 02/09/23 1022 174 mg at 02/09/23 1022   sodium chloride flush (NS) 0.9 % injection 10 mL  10 mL Intracatheter PRN Artis Delay, MD        SUMMARY OF ONCOLOGIC HISTORY: Oncology History Overview Note  Her 2 neu 1+ MSI stable, serous, ER 40% Negative genetics Progressed on niraparib  and bevacizumab Folate receptor alpha neg PD-L1 CPS 1%   Left ovarian epithelial cancer (HCC)  11/09/2018 Imaging   1. 14.7 cm poorly defined soft tissue mass in central pelvis suspicious for primary ovarian carcinoma, with uterine leiomyosarcoma considered a less likely differential diagnosis. 2. Moderate ascites and diffuse peritoneal carcinomatosis. 3. Several small uterine fibroids.   11/13/2018 Tumor Marker   Patient's tumor was tested for the following markers: CA-125 Results of the tumor marker test revealed 1434   11/21/2018 Procedure   Successful ultrasound-guided diagnostic and therapeutic paracentesis yielding 3.1 liters of peritoneal fluid   11/21/2018 Pathology Results   PERITONEAL/ASCITIC FLUID(SPECIMEN 1 OF 1 COLLECTED 11/21/18): ADENOCARCINOMA. Specimen Clinical Information Pelvic mass suspicious for ovarian cancer Source Peritoneal/Ascitic Fluid, (specimen 1 of 1 collected 11/21/18) Gross Specimen: Received is/are 1000 ccs of dark amber fluid. (CM:cm) Prepared: # Smears: 0 # Concentration Technique Slides (i.e. 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  11/29/2018 Initial Diagnosis   Ovarian cancer (HCC)   11/29/2018 Cancer Staging   Staging form: Ovary, Fallopian Tube, and Primary Peritoneal Carcinoma, AJCC 8th Edition - Clinical: cT3, cN0, cM0 - Signed by Artis Delay, MD on 11/29/2018   12/03/2018 Procedure   Successful ultrasound-guided therapeutic paracentesis yielding 3.7 liters of peritoneal fluid.     12/06/2018 Procedure   Placement of a subcutaneous port device. Catheter tip at the SVC and right atrium junction   12/14/2018 Procedure   Successful ultrasound-guided paracentesis yielding 2.9 L of peritoneal fluid   12/28/2018 Tumor Marker   Patient's tumor was tested for the following markers: CA-125 Results of the tumor marker test revealed 812.   12/28/2018 -  04/22/2019 Chemotherapy   The patient had carboplatin and taxol for neoadjuvant treatment, followed by interval debulking surgery and subsequent adjuvant chemotherapy treatment.     02/01/2019 Imaging   Ct abdomen and pelvis 8.6 cm left ovarian mass, corresponding to the patient's known primary neoplasm, improved.   Mild peritoneal nodularity/omental caking, improved.   Small abdominopelvic ascites, improved.     02/01/2019 Tumor Marker   Patient's tumor was tested for the following markers: CA-125 Results of the tumor marker test revealed 183.   02/12/2019 Surgery   Preoperative Diagnosis: Stage IIIC ovarian cancer, s/p neoadjuvant chemotherapy      Procedure(s) Performed: 1. Exploratory laparotomy with total abdominal hysterectomy, bilateral salpingo-oophorectomy, omentectomy radical tumor debulking for ovarian cancer.   Surgeon: Luisa Dago, MD.    Operative Findings: upper abdomen free of disease. No visible omental disease. Small volume (200cc) ascites. 8cm friable mass replacing left ovary and adherent to the sigmoid colon mesentery and ureter on the left.  Anterior fibroid. This represented an optimal cytoreduction (R0) with no gross visible disease remaining.    02/12/2019 Pathology Results   A. OVARY AND FALLOPIAN TUBE, LEFT, SALPINGO-OOPHORECTOMY: - Serous carcinoma, high grade, status post neoadjuvant therapy - See oncology table and comment below B. UTERUS CERVIX WITH RIGHT FALLOPIAN TUBE AND OVARY, HYSTERECTOMY: Uterus: - Serosal surface involved by serous carcinoma - Endomyometrium uninvolved by carcinoma - Benign endometrial polyp (4.1 cm) - Leiomyomata (5.5 cm; largest) - Adenomyosis Cervix: - Uninvolved by carcinoma Left ovary: - Serous carcinoma, high grade Left fallopian tube: - Serous carcinoma, high grade C. SOFT TISSUE, LEFT PELVIC SIDEWALL TUMOR, EXCISION: - Metastatic serous carcinoma, high-grade D. SOFT TISSUE, SIGMOID COLON MESENTERY, EXCISION: -  Metastatic serous carcinoma, high-grade E. OMENTUM, TUMOR RESECTION: - Metastatic serous carcinoma, high-grade OVARY or FALLOPIAN TUBE or PRIMARY PERITONEUM: Procedure: Total hysterectomy and bilateral salpingo-oophorectomy, omentectomy and peritoneal biopsies Specimen Integrity: Fragmented Tumor Site: Left ovary and fallopian tube Ovarian Surface Involvement: Present Fallopian Tube Surface Involvement: Present Tumor Size: 6.3 cm (see comment) Histologic Type: Serous carcinoma Histologic Grade: High-grade Implants: Not applicable Other Tissue/ Organ Involvement: Right ovary, right fallopian tube, omentum, mesentery Largest Extrapelvic Peritoneal Focus: Macroscopic Peritoneal/Ascitic Fluid: Malignant Treatment Effect: No definite or minimal response identified (chemotherapy response score 1 [CRS1] Regional Lymph Nodes: No lymph nodes submitted or found Pathologic Stage Classification (pTNM, AJCC 8th Edition): pT3c, pNX Representative Tumor Block: A4 Comment(s): Additional testing (HER-2, MMR and MSI) are pending. The primary tumor site appears to be the left ovary and fallopian tube. The uterus is only involved on the serosal surface. There is tumor on the anterior peritoneal reflection.  Addendum: Tumor is Her2 negative,MSI stable   02/19/2019 Tumor Marker   Patient's tumor was tested for the following markers: CA-125 Results of the tumor  marker test revealed 40.3   04/01/2019 Tumor Marker   Patient's tumor was tested for the following markers: CA-125 Results of the tumor marker test revealed 8.1    Genetic Testing   Negative testing. No pathogenic variants identified on the [employer]. The report date is 04/17/2019.  Somatic genes analyzed through [facility]: ATM, BARD1, BRCA1, BRCA2, BRIP1, CHEK2, MRE11A, NBN, PALB2, RAD51C, RAD51D.  The [facility] gene panel offered by [employer] includes sequencing and rearrangement analysis for the following 36  genes: APC*, ATM*, AXIN2, BARD1, BMPR1A, BRCA1*, BRCA2*, BRIP1*, CDH1*, CDK4, CDKN2A, CHEK2*, DICER1, MLH1*, MSH2*, MSH3, MSH6*, MUTYH*, NBN, NF1*, NTHL1, PALB2*, PMS2*, PTEN*, RAD51C*, RAD51D*, RECQL, SMAD4, SMARCA4, STK11 and TP53* (sequencing and deletion/duplication); HOXB13, POLD1 and POLE (sequencing only); EPCAM and GREM1 (deletion/duplication only).    05/21/2019 Imaging   1. Interval hysterectomy, oophorectomy and omentectomy. No evidence of residual ovarian carcinoma in the pelvis. No evidence of peritoneal disease. No intraperitoneal free fluid. 2. Nonobstructing LEFT renal calculi.  Normal ureters.   05/22/2019 Tumor Marker   Patient's tumor was tested for the following markers: CA-125 Results of the tumor marker test revealed 5.7.   06/03/2019 Procedure   Successful right IJ vein Port-A-Cath explant.   08/26/2019 Tumor Marker   Patient's tumor was tested for the following markers: CA-125. Results of the tumor marker test revealed 4.9   10/03/2019 Imaging   1. New tumor deposits along the capsular surfaces of the liver, spleen, in the left pelvis, and right paracolic gutter, compatible with metastatic disease. 2. Mild dilation of the dorsal pancreatic duct in the pancreatic body and head, cause uncertain. 3. Nonobstructive left nephrolithiasis. 4. Aortic atherosclerosis.   Aortic Atherosclerosis (ICD10-I70.0   10/10/2019 Procedure   Successful placement of a right IJ approach Power Port with ultrasound and fluoroscopic guidance. The catheter is ready for use.     10/11/2019 Tumor Marker   Patient's tumor was tested for the following markers: CA-125 Results of the tumor marker test revealed 19.6.   10/14/2019 -  Chemotherapy   The patient had carboplatin and gemzar for chemotherapy treatment.     11/04/2019 Tumor Marker   Patient's tumor was tested for the following markers: CA-125 Results of the tumor marker test revealed 11.4   11/28/2019 Tumor Marker   Patient's tumor  was tested for the following markers: CA-125 Results of the tumor marker test revealed 5.8   12/20/2019 Imaging   1. Significant interval reduction in mixed solid and cystic nodule in the vicinity of the left ovary, with significant improvement or resolution of multiple pelvic, peritoneal, and organ capsule implants. Resolution of previously seen small volume ascites. Findings are consistent with treatment response of abdominal metastatic disease. 2. Status post hysterectomy, oophorectomy, and omentectomy. 3. Nonobstructive left nephrolithiasis. 4. Aortic Atherosclerosis (ICD10-I70.0).   12/23/2019 Tumor Marker   Patient's tumor was tested for the following markers: CA-125 Results of the tumor marker test revealed 5.0   01/24/2020 Tumor Marker   Patient's tumor was tested for the following markers: CA-125 Results of the tumor marker test revealed 4.8   02/24/2020 Tumor Marker   Patient's tumor was tested for the following markers: CA-125. Results of the tumor marker test revealed 4.3.   03/06/2020 Imaging   1. Status post hysterectomy, bilateral oophorectomy, and omentectomy. 2. Resolution of previously described left adnexal nodularity. 3. No residual disease identified. 4.  Aortic Atherosclerosis (ICD10-I70.0). 5. Left nephrolithiasis.   04/20/2020 Tumor Marker   Patient's tumor was tested for the following  markers: CA-125 Results of the tumor marker test revealed 4.6   05/18/2020 Tumor Marker   Patient's tumor was tested for the following markers: CA-125 Results of the tumor marker test revealed 4.7   05/29/2020 Imaging   Status post hysterectomy and bilateral salpingo oophorectomy.   No evidence of recurrent or metastatic disease.   06/08/2020 - 09/13/2021 Chemotherapy   She was on niraparib       06/17/2020 Tumor Marker   Patient's tumor was tested for the following markers: CA-125 Results of the tumor marker test revealed 4.5   08/04/2020 Tumor Marker   Patient's tumor  was tested for the following markers: CA-125. Results of the tumor marker test revealed 5.7   09/07/2020 Imaging   1. Status post hysterectomy and bilateral salpingo oophorectomy. No evidence of recurrent or metastatic disease. 2. Nonobstructing left nephrolithiasis. 3. Left-sided colonic diverticulosis without findings of acute diverticulitis. 4. Aortic atherosclerosis.     09/07/2020 Tumor Marker   Patient's tumor was tested for the following markers: CA-125 Results of the tumor marker test revealed 4.8.   10/21/2020 Tumor Marker   Patient's tumor was tested for the following markers: CA-125. Results of the tumor marker test revealed 5.1.   12/14/2020 Tumor Marker   Patient's tumor was tested for the following markers: CA-125. Results of the tumor marker test revealed 5.5.   04/05/2021 Tumor Marker   Patient's tumor was tested for the following markers: CA-125. Results of the tumor marker test revealed 5.9.   05/31/2021 Tumor Marker   Patient's tumor was tested for the following markers: CA-125. Results of the tumor marker test revealed 8.1.   09/13/2021 Imaging   1. Interval development of multiple new cystic and solid lesions in the abdomen and pelvis measuring up to 10.3 x 7.2 cm and consistent with peritoneal metastatic disease. 2. Nonobstructing left renal stones. 3. Aortic Atherosclerosis (ICD10-I70.0).     09/14/2021 Tumor Marker   Patient's tumor was tested for the following markers: CA-125. Results of the tumor marker test revealed 28.5.   09/16/2021 Echocardiogram   1. Left ventricular ejection fraction, by estimation, is 60 to 65%. The left ventricle has normal function. The left ventricle has no regional wall motion abnormalities. Left ventricular diastolic parameters are consistent with Grade I diastolic dysfunction (impaired relaxation).  2. Right ventricular systolic function is normal. The right ventricular size is normal.  3. The mitral valve is normal in  structure. Trivial mitral valve regurgitation. No evidence of mitral stenosis.  4. The aortic valve has an indeterminant number of cusps. Aortic valve regurgitation is trivial. No aortic stenosis is present.  5. The inferior vena cava is normal in size with greater than 50% respiratory variability, suggesting right atrial pressure of 3 mmHg.     09/20/2021 - 01/17/2022 Chemotherapy   Patient is on Treatment Plan : OVARIAN RECURRENT Liposomal Doxorubicin + Carboplatin q28d X 6 Cycles     09/20/2021 - 02/22/2022 Chemotherapy   Patient is on Treatment Plan : OVARIAN RECURRENT Liposomal Doxorubicin + Carboplatin q28d X 6 Cycles     09/23/2021 Tumor Marker   Patient's tumor was tested for the following markers: CA-125. Results of the tumor marker test revealed 27.7.   11/24/2021 Tumor Marker   Patient's tumor was tested for the following markers: CA-125. Results of the tumor marker test revealed 28.4.   12/13/2021 Imaging   1. Overall stable peritoneal carcinomatosis. No new disease is demonstrated. 2. Moderate left-sided hydroureteronephrosis likely due to compression of the left  ureter by the left pelvic mass. 3. Stable left-sided renal calculi. 4. Numerous left renal calculi.     12/22/2021 Tumor Marker   Patient's tumor was tested for the following markers: CA-125. Results of the tumor marker test revealed 24.7.   01/18/2022 Tumor Marker   Patient's tumor was tested for the following markers: CA-125. Results of the tumor marker test revealed 9.9.   01/27/2022 Echocardiogram    1. Left ventricular ejection fraction, by estimation, is 60 to 65%. The left ventricle has normal function. The left ventricle has no regional wall motion abnormalities. Left ventricular diastolic parameters are consistent with Grade I diastolic dysfunction (impaired relaxation). The average left ventricular global longitudinal strain is -21.1 %. The global longitudinal strain is normal.  2. Right ventricular systolic  function is normal. The right ventricular size is normal.  3. The mitral valve is abnormal. Mild mitral valve regurgitation.  4. The aortic valve is tricuspid. Aortic valve regurgitation is trivial.  5. The inferior vena cava is normal in size with greater than 50% respiratory variability, suggesting right atrial pressure of 3 mmHg.   02/23/2022 Tumor Marker   Patient's tumor was tested for the following markers: CA-125. Results of the tumor marker test revealed 8.5.   03/18/2022 Imaging   1. Diminished size of nodular and mixed solid and cystic peritoneal implants in the hepatorenal fossa, right lower quadrant, and left pelvis. 2. Unchanged solid mass in the right pelvis measuring 4.0 x 3.7 cm. 3. Constellation of findings is overall consistent with treatment response 4. Status post hysterectomy, oophorectomy, and omentectomy. 5. Nonobstructive left nephrolithiasis.   Aortic Atherosclerosis (ICD10-I70.0).     03/22/2022 - 08/16/2022 Chemotherapy   Patient is on Treatment Plan : Ovarian Bevacizumab q21d     03/24/2022 Tumor Marker   Patient's tumor was tested for the following markers: CA-125. Results of the tumor marker test revealed 7.1.   04/28/2022 Tumor Marker   Patient's tumor was tested for the following markers: CA-125. Results of the tumor marker test revealed 7.0.   05/27/2022 Tumor Marker   Patient's tumor was tested for the following markers: CA-125. Results of the tumor marker test revealed 7.7.   06/14/2022 Imaging   1. Slight response to therapy as evidenced by decrease in size of a cystic and solid mass in the left adnexa as well as peritoneal implants in the right paracolic gutter. Solid right adnexal mass and additional peritoneal implants along right hepatic lobe and in the left anatomic pelvis appear stable. 2. Hepatic steatosis. 3. Left renal stones. 4.  Aortic atherosclerosis (ICD10-I70.0).   06/17/2022 Tumor Marker   Patient's tumor was tested for the following  markers: CA-125. Results of the tumor marker test revealed 11.2.   09/05/2022 Imaging   CT ABDOMEN PELVIS W CONTRAST  Result Date: 09/06/2022 CLINICAL DATA:  Ovarian cancer.  Restaging.  * Tracking Code: BO * EXAM: CT ABDOMEN AND PELVIS WITH CONTRAST TECHNIQUE: Multidetector CT imaging of the abdomen and pelvis was performed using the standard protocol following bolus administration of intravenous contrast. RADIATION DOSE REDUCTION: This exam was performed according to the departmental dose-optimization program which includes automated exposure control, adjustment of the mA and/or kV according to patient size and/or use of iterative reconstruction technique. CONTRAST:  OMNIPAQUE IOHEXOL 300 MG/ML  SOLN COMPARISON:  06/13/2022 FINDINGS: Lower chest: Unremarkable. Hepatobiliary: No suspicious focal abnormality within the liver parenchyma. There is no evidence for gallstones, gallbladder wall thickening, or pericholecystic fluid. No intrahepatic or extrahepatic biliary dilation.  Pancreas: No pancreatic mass lesion evident. Diffuse prominence of the main pancreatic duct is stable measuring up to 3-4 mm diameter in the head of the pancreas. Spleen: No splenomegaly. No focal mass lesion. Adrenals/Urinary Tract: No adrenal nodule or mass. Right kidney unremarkable. A cluster of nonobstructing stones in the upper pole right kidney is again noted with dominant stone measuring about 6 mm in size. No evidence for hydroureter. The urinary bladder appears normal for the degree of distention. Stomach/Bowel: Tiny hiatal hernia. Stomach otherwise unremarkable. Duodenum is normally positioned as is the ligament of Treitz. No small bowel wall thickening. No small bowel dilatation. The terminal ileum is normal. The appendix is normal. No gross colonic mass. No colonic wall thickening. Diverticular changes are noted in the left colon without evidence of diverticulitis. Vascular/Lymphatic: There is mild atherosclerotic  calcification of the abdominal aorta without aneurysm. There is no gastrohepatic or hepatoduodenal ligament lymphadenopathy. No retroperitoneal or mesenteric lymphadenopathy. No pelvic sidewall lymphadenopathy. Reproductive: Right-sided adnexal mass measured previously at 4.2 x 3.8 cm is now 4.9 x 4.6 cm. Cystic and solid left adnexal lesion measured previously at 4.1 x 3.4 cm is now 3.2 x 2.9 cm. 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 The more anterior of the 2 index lesions measures 2.6 x 2.1 cm today compared to 2.4 x 1.6 cm previously. The more posterior lesion is 2.4 x 1.4 cm today compared to 2.0 x 1.9 cm previously. 5 mm soft tissue nodule identified in the inferior paracolic gutter previously at 5 mm measures 10 mm today on image 51/2. Implants along the left posterior peritoneum adjacent to the psoas muscle and iliac vessels appear progressive. 1.6 x 1.3 cm lesion visible on 58/2 was 1.1 x 0.8 cm previously (remeasured). No evidence for ascites. Musculoskeletal: No worrisome lytic or sclerotic osseous abnormality. IMPRESSION: 1. Overall generalized appearance of mild disease progression. 2. Index right-sided adnexal mass has progressed since the prior with interval decrease in size of the cystic and solid left adnexal lesion. 3. Interval progression of index capsular implants along the medial right liver. 4. Interval progression of soft tissue nodule in the inferior right paracolic gutter and peritoneal implants in the lower left abdomen and pelvis. 5. No evidence for ascites. 6. Nonobstructing left renal stones. 7. Tiny hiatal hernia. 8. Left colonic diverticulosis without diverticulitis. 9.  Aortic Atherosclerosis (ICD10-I70.0). Electronically Signed   By: Kennith Center M.D.   On: 09/06/2022 07:57      09/15/2022 -  Chemotherapy   Patient is on Treatment Plan : OVARIAN Paclitaxel (175) q21d X 6 Cycles     11/16/2022 Imaging   CT ABDOMEN PELVIS W CONTRAST  Result Date:  11/16/2022 CLINICAL DATA:  Ovarian carcinoma. Assess treatment response. * Tracking Code: BO * EXAM: CT ABDOMEN AND PELVIS WITH CONTRAST TECHNIQUE: Multidetector CT imaging of the abdomen and pelvis was performed using the standard protocol following bolus administration of intravenous contrast. RADIATION DOSE REDUCTION: This exam was performed according to the departmental dose-optimization program which includes automated exposure control, adjustment of the mA and/or kV according to patient size and/or use of iterative reconstruction technique. CONTRAST:  OMNIPAQUE IOHEXOL 300 MG/ML  SOLN COMPARISON:  None Available. FINDINGS: Lower chest: Lung bases are clear. Hepatobiliary: No focal hepatic lesion. Normal gallbladder. No biliary duct dilatation. Common bile duct is normal. Peritoneal implant described along the margin the RIGHT hepatic liver is much less conspicuous. Capsular lesion measuring 12 mm on image 14/2 is decreased from 26  mm. Pancreas: Pancreas is normal. No ductal dilatation. No pancreatic inflammation. Spleen: Normal spleen Adrenals/urinary tract: Adrenal glands normal. Multiple calculi within LEFT kidney without obstruction. Ureters and bladder normal. Stomach/Bowel: Stomach, small bowel, appendix, and cecum are normal. Multiple diverticula of the descending colon and sigmoid colon without acute inflammation. Vascular/Lymphatic: Abdominal aorta is normal caliber. No periportal or retroperitoneal adenopathy. No pelvic adenopathy. Reproductive: Solid enhancing mass in the deep RIGHT pelvis adjacent the rectum measures 4.0 x 4.3 cm compared to 4.6 x 4.9 cm. Small adjacent satellite nodule measuring 1.1 cm (image 63) is unchanged. Cystic lesion of the LEFT adnexa with mural nodularity measures 3.0 x 2.4 cm compared to 3.2 x 2.9 cm. No new nodularity in the pelvis. Post hysterectomy. Other: Interval decrease in the peritoneal nodularity. For example peritoneal nodule adjacent to the cecum in the  RIGHT iliac fossa measures 6 mm (image 43/2 compared to 10 mm on prior. Lesions on the LEFT iliac vessels less prominent. For example 7 mm lesion on 50/2 decreased from 16 mm. Musculoskeletal: No aggressive osseous lesion. IMPRESSION: 1. Interval decrease in size of RIGHT pelvic mass. 2. Interval decrease in size of LEFT adnexal cystic mass. 3. Interval decrease in size of peritoneal nodularity. 4. Interval decrease in size of perihepatic peritoneal implant. 5. No new metastatic disease. Electronically Signed   By: Genevive Bi M.D.   On: 11/16/2022 14:34      02/08/2023 Imaging   CT ABDOMEN PELVIS W CONTRAST  Result Date: 02/08/2023 CLINICAL DATA:  Ovarian carcinoma.  Assess treatment response. EXAM: CT ABDOMEN AND PELVIS WITH CONTRAST TECHNIQUE: Multidetector CT imaging of the abdomen and pelvis was performed using the standard protocol following bolus administration of intravenous contrast. RADIATION DOSE REDUCTION: This exam was performed according to the departmental dose-optimization program which includes automated exposure control, adjustment of the mA and/or kV according to patient size and/or use of iterative reconstruction technique. CONTRAST:  OMNIPAQUE IOHEXOL 300 MG/ML  SOLN COMPARISON:  CT 11/14/2022 FINDINGS: Lower chest: Lung bases are clear. Hepatobiliary: No focal hepatic lesion. Normal gallbladder. No biliary duct dilatation. Common bile duct is normal. Small peritoneal implant along the RIGHT hepatic lobe is less conspicuous at 6 mm (image 18/2) compared to 11 mm on prior. Pancreas: Pancreas is normal. No ductal dilatation. No pancreatic inflammation. Spleen: Normal spleen Adrenals/urinary tract: Adrenal glands normal. Multiple nonobstructing calculi in the upper pole of the LEFT kidney. Ureters and bladder normal. Stomach/Bowel: Stomach, small bowel, appendix, and cecum are normal. The colon and rectosigmoid colon are normal. Vascular/Lymphatic: Abdominal aorta is normal caliber  with atherosclerotic calcification. There is no retroperitoneal or periportal lymphadenopathy. No pelvic lymphadenopathy. Reproductive: Post hysterectomy anatomy. Again demonstrated rounded mass posterior to the vaginal cuff just RIGHT of midline measures 3.7 by 3.0 cm (image 69/series 2) compared to 4.3 x 4.0 cm on most recent CT. Part cystic and solid lesion in the LEFT adnexa measures 2.2 x 1.6 cm (image 65) compared to 3.0 x 2.2 cm. Previously measured peritoneal implant in the deep RIGHT pelvis is no longer identified. No new mass lesions within the pelvis. Other: No new peritoneal disease.  No free fluid. Musculoskeletal: No aggressive osseous lesion. IMPRESSION: 1. Interval decrease in size rounded mass in the deep RIGHT pelvis. 2. Interval decrease in size of cystic LEFT adnexal lesion. 3. Decrease in size of peritoneal implants. 4. No evidence of new metastatic disease. No intraperitoneal free fluid Electronically Signed   By: Genevive Bi M.D.   On: 02/08/2023  10:56        PHYSICAL EXAMINATION: ECOG PERFORMANCE STATUS: 1 - Symptomatic but completely ambulatory  Vitals:   02/09/23 0826  BP: 136/86  Pulse: 93  Resp: 18  Temp: 97.6 F (36.4 C)  SpO2: 100%   Filed Weights   02/09/23 0826  Weight: 141 lb 6.4 oz (64.1 kg)    GENERAL:alert, no distress and comfortable   LABORATORY DATA:  I have reviewed the data as listed    Component Value Date/Time   NA 142 02/09/2023 0749   K 3.6 02/09/2023 0749   CL 107 02/09/2023 0749   CO2 26 02/09/2023 0749   GLUCOSE 187 (H) 02/09/2023 0749   BUN 14 02/09/2023 0749   CREATININE 0.55 02/09/2023 0749   CALCIUM 9.5 02/09/2023 0749   PROT 6.8 02/09/2023 0749   ALBUMIN 4.3 02/09/2023 0749   AST 64 (H) 02/09/2023 0749   ALT 78 (H) 02/09/2023 0749   ALKPHOS 136 (H) 02/09/2023 0749   BILITOT 0.9 02/09/2023 0749   GFRNONAA >60 02/09/2023 0749   GFRAA >60 02/24/2020 0852    No results found for: "SPEP", "UPEP"  Lab Results   Component Value Date   WBC 7.1 02/09/2023   NEUTROABS 6.3 02/09/2023   HGB 12.1 02/09/2023   HCT 36.0 02/09/2023   MCV 103.4 (H) 02/09/2023   PLT 209 02/09/2023      Chemistry      Component Value Date/Time   NA 142 02/09/2023 0749   K 3.6 02/09/2023 0749   CL 107 02/09/2023 0749   CO2 26 02/09/2023 0749   BUN 14 02/09/2023 0749   CREATININE 0.55 02/09/2023 0749      Component Value Date/Time   CALCIUM 9.5 02/09/2023 0749   ALKPHOS 136 (H) 02/09/2023 0749   AST 64 (H) 02/09/2023 0749   ALT 78 (H) 02/09/2023 0749   BILITOT 0.9 02/09/2023 0749       RADIOGRAPHIC STUDIES: I have reviewed multiple imaging studies with the patient I have personally reviewed the radiological images as listed and agreed with the findings in the report. CT ABDOMEN PELVIS W CONTRAST  Result Date: 02/08/2023 CLINICAL DATA:  Ovarian carcinoma.  Assess treatment response. EXAM: CT ABDOMEN AND PELVIS WITH CONTRAST TECHNIQUE: Multidetector CT imaging of the abdomen and pelvis was performed using the standard protocol following bolus administration of intravenous contrast. RADIATION DOSE REDUCTION: This exam was performed according to the departmental dose-optimization program which includes automated exposure control, adjustment of the mA and/or kV according to patient size and/or use of iterative reconstruction technique. CONTRAST:  OMNIPAQUE IOHEXOL 300 MG/ML  SOLN COMPARISON:  CT 11/14/2022 FINDINGS: Lower chest: Lung bases are clear. Hepatobiliary: No focal hepatic lesion. Normal gallbladder. No biliary duct dilatation. Common bile duct is normal. Small peritoneal implant along the RIGHT hepatic lobe is less conspicuous at 6 mm (image 18/2) compared to 11 mm on prior. Pancreas: Pancreas is normal. No ductal dilatation. No pancreatic inflammation. Spleen: Normal spleen Adrenals/urinary tract: Adrenal glands normal. Multiple nonobstructing calculi in the upper pole of the LEFT kidney. Ureters and bladder  normal. Stomach/Bowel: Stomach, small bowel, appendix, and cecum are normal. The colon and rectosigmoid colon are normal. Vascular/Lymphatic: Abdominal aorta is normal caliber with atherosclerotic calcification. There is no retroperitoneal or periportal lymphadenopathy. No pelvic lymphadenopathy. Reproductive: Post hysterectomy anatomy. Again demonstrated rounded mass posterior to the vaginal cuff just RIGHT of midline measures 3.7 by 3.0 cm (image 69/series 2) compared to 4.3 x 4.0 cm on most recent CT.  Part cystic and solid lesion in the LEFT adnexa measures 2.2 x 1.6 cm (image 65) compared to 3.0 x 2.2 cm. Previously measured peritoneal implant in the deep RIGHT pelvis is no longer identified. No new mass lesions within the pelvis. Other: No new peritoneal disease.  No free fluid. Musculoskeletal: No aggressive osseous lesion. IMPRESSION: 1. Interval decrease in size rounded mass in the deep RIGHT pelvis. 2. Interval decrease in size of cystic LEFT adnexal lesion. 3. Decrease in size of peritoneal implants. 4. No evidence of new metastatic disease. No intraperitoneal free fluid Electronically Signed   By: Genevive Bi M.D.   On: 02/08/2023 10:56

## 2023-02-09 NOTE — Assessment & Plan Note (Signed)
She continues to experience fatigue We discussed importance of regular exercise We discussed risk and benefits for her to retire versus continue working

## 2023-02-09 NOTE — Assessment & Plan Note (Signed)
I have reviewed multiple imaging studies with the patient Overall, she has positive response to therapy Due to reaction to carboplatin, she will be getting single agent paclitaxel only We discussed the risk and benefits of continuing paclitaxel every 3 weeks versus weekly paclitaxel For now, we will continue current treatment as scheduled We discussed future follow-up as well as other treatment options available in the future Plan to repeat imaging study again in December

## 2023-02-12 ENCOUNTER — Other Ambulatory Visit: Payer: Self-pay

## 2023-02-20 ENCOUNTER — Telehealth: Payer: Self-pay | Admitting: Hematology and Oncology

## 2023-02-20 NOTE — Telephone Encounter (Signed)
 Left patient a message in regards to scheduled appointment times/dates

## 2023-02-21 ENCOUNTER — Other Ambulatory Visit: Payer: Self-pay

## 2023-03-01 MED FILL — Fosaprepitant Dimeglumine For IV Infusion 150 MG (Base Eq): INTRAVENOUS | Qty: 5 | Status: AC

## 2023-03-01 MED FILL — Dexamethasone Sodium Phosphate Inj 100 MG/10ML: INTRAMUSCULAR | Qty: 1 | Status: AC

## 2023-03-02 ENCOUNTER — Inpatient Hospital Stay: Payer: Medicare Other

## 2023-03-02 ENCOUNTER — Inpatient Hospital Stay: Payer: Medicare Other | Admitting: Hematology and Oncology

## 2023-03-02 ENCOUNTER — Inpatient Hospital Stay: Payer: Medicare Other | Attending: Gynecologic Oncology

## 2023-03-02 ENCOUNTER — Encounter: Payer: Self-pay | Admitting: Hematology and Oncology

## 2023-03-02 VITALS — BP 159/88 | HR 77 | Resp 16

## 2023-03-02 VITALS — BP 153/86 | HR 83 | Resp 18 | Ht 62.0 in | Wt 142.4 lb

## 2023-03-02 DIAGNOSIS — Z87891 Personal history of nicotine dependence: Secondary | ICD-10-CM | POA: Insufficient documentation

## 2023-03-02 DIAGNOSIS — C562 Malignant neoplasm of left ovary: Secondary | ICD-10-CM | POA: Insufficient documentation

## 2023-03-02 DIAGNOSIS — Z7189 Other specified counseling: Secondary | ICD-10-CM

## 2023-03-02 DIAGNOSIS — R051 Acute cough: Secondary | ICD-10-CM | POA: Diagnosis not present

## 2023-03-02 DIAGNOSIS — G620 Drug-induced polyneuropathy: Secondary | ICD-10-CM | POA: Diagnosis not present

## 2023-03-02 DIAGNOSIS — U071 COVID-19: Secondary | ICD-10-CM | POA: Diagnosis not present

## 2023-03-02 DIAGNOSIS — C786 Secondary malignant neoplasm of retroperitoneum and peritoneum: Secondary | ICD-10-CM | POA: Insufficient documentation

## 2023-03-02 DIAGNOSIS — Z5111 Encounter for antineoplastic chemotherapy: Secondary | ICD-10-CM | POA: Insufficient documentation

## 2023-03-02 LAB — CMP (CANCER CENTER ONLY)
ALT: 35 U/L (ref 0–44)
AST: 32 U/L (ref 15–41)
Albumin: 4.5 g/dL (ref 3.5–5.0)
Alkaline Phosphatase: 94 U/L (ref 38–126)
Anion gap: 9 (ref 5–15)
BUN: 13 mg/dL (ref 8–23)
CO2: 26 mmol/L (ref 22–32)
Calcium: 10 mg/dL (ref 8.9–10.3)
Chloride: 105 mmol/L (ref 98–111)
Creatinine: 0.54 mg/dL (ref 0.44–1.00)
GFR, Estimated: >60 mL/min (ref >60)
Glucose, Bld: 143 mg/dL — ABNORMAL HIGH (ref 70–99)
Potassium: 3.8 mmol/L (ref 3.5–5.1)
Sodium: 140 mmol/L (ref 135–145)
Total Bilirubin: 0.8 mg/dL (ref 0.3–1.2)
Total Protein: 7 g/dL (ref 6.5–8.1)

## 2023-03-02 LAB — CBC WITH DIFFERENTIAL (CANCER CENTER ONLY)
Abs Immature Granulocytes: 0.03 10*3/uL (ref 0.00–0.07)
Basophils Absolute: 0 10*3/uL (ref 0.0–0.1)
Basophils Relative: 0 %
Eosinophils Absolute: 0 10*3/uL (ref 0.0–0.5)
Eosinophils Relative: 0 %
HCT: 37.4 % (ref 36.0–46.0)
Hemoglobin: 12.5 g/dL (ref 12.0–15.0)
Immature Granulocytes: 0 %
Lymphocytes Relative: 6 %
Lymphs Abs: 0.5 10*3/uL — ABNORMAL LOW (ref 0.7–4.0)
MCH: 34 pg (ref 26.0–34.0)
MCHC: 33.4 g/dL (ref 30.0–36.0)
MCV: 101.6 fL — ABNORMAL HIGH (ref 80.0–100.0)
Monocytes Absolute: 0.1 10*3/uL (ref 0.1–1.0)
Monocytes Relative: 1 %
Neutro Abs: 7.7 10*3/uL (ref 1.7–7.7)
Neutrophils Relative %: 93 %
Platelet Count: 213 10*3/uL (ref 150–400)
RBC: 3.68 MIL/uL — ABNORMAL LOW (ref 3.87–5.11)
RDW: 13.6 % (ref 11.5–15.5)
WBC Count: 8.3 10*3/uL (ref 4.0–10.5)
nRBC: 0 % (ref 0.0–0.2)

## 2023-03-02 MED ORDER — HEPARIN SOD (PORK) LOCK FLUSH 100 UNIT/ML IV SOLN
500.0000 [IU] | Freq: Once | INTRAVENOUS | Status: AC | PRN
  Administered 2023-03-02: 500 [IU]

## 2023-03-02 MED ORDER — SODIUM CHLORIDE 0.9% FLUSH
10.0000 mL | INTRAVENOUS | Status: DC | PRN
  Administered 2023-03-02: 10 mL

## 2023-03-02 MED ORDER — SODIUM CHLORIDE 0.9 % IV SOLN
10.0000 mg | Freq: Once | INTRAVENOUS | Status: AC
  Administered 2023-03-02: 10 mg via INTRAVENOUS
  Filled 2023-03-02: qty 10

## 2023-03-02 MED ORDER — SODIUM CHLORIDE 0.9 % IV SOLN: Freq: Once | INTRAVENOUS | Status: AC

## 2023-03-02 MED ORDER — FAMOTIDINE IN NACL 20-0.9 MG/50ML-% IV SOLN
20.0000 mg | Freq: Once | INTRAVENOUS | Status: AC
  Administered 2023-03-02: 20 mg via INTRAVENOUS
  Filled 2023-03-02: qty 50

## 2023-03-02 MED ORDER — SODIUM CHLORIDE 0.9 % IV SOLN
150.0000 mg | Freq: Once | INTRAVENOUS | Status: AC
  Administered 2023-03-02: 150 mg via INTRAVENOUS
  Filled 2023-03-02: qty 150

## 2023-03-02 MED ORDER — SODIUM CHLORIDE 0.9 % IV SOLN
105.0000 mg/m2 | Freq: Once | INTRAVENOUS | Status: AC
  Administered 2023-03-02: 174 mg via INTRAVENOUS
  Filled 2023-03-02: qty 29

## 2023-03-02 MED ORDER — CETIRIZINE HCL 10 MG/ML IV SOLN
10.0000 mg | Freq: Once | INTRAVENOUS | Status: AC
  Administered 2023-03-02: 10 mg via INTRAVENOUS
  Filled 2023-03-02: qty 1

## 2023-03-02 MED ORDER — PALONOSETRON HCL INJECTION 0.25 MG/5ML
0.2500 mg | Freq: Once | INTRAVENOUS | Status: AC
  Administered 2023-03-02: 0.25 mg via INTRAVENOUS
  Filled 2023-03-02: qty 5

## 2023-03-02 MED ORDER — SODIUM CHLORIDE 0.9% FLUSH
10.0000 mL | Freq: Once | INTRAVENOUS | Status: AC
  Administered 2023-03-02: 10 mL

## 2023-03-02 NOTE — Patient Instructions (Signed)
[facility]   Discharge Instructions: Thank you for choosing [facility] to provide your oncology and hematology care.   If you have a lab appointment with the [facility], please go directly to the [facility] and check in at the registration area.   Wear comfortable clothing and clothing appropriate for easy access to any Portacath or PICC line.   We strive to give you quality time with your provider. You may need to reschedule your appointment if you arrive late (15 or more minutes).  Arriving late affects you and other patients whose appointments are after yours.  Also, if you miss three or more appointments without notifying the office, you may be dismissed from the clinic at the provider's discretion.      For prescription refill requests, have your pharmacy contact our office and allow 72 hours for refills to be completed.    Today you received the following chemotherapy and/or immunotherapy agents: Paclitaxel (Taxol)      To help prevent nausea and vomiting after your treatment, we encourage you to take your nausea medication as directed.  BELOW ARE SYMPTOMS THAT SHOULD BE REPORTED IMMEDIATELY: *FEVER GREATER THAN 100.4 F (38 C) OR HIGHER *CHILLS OR SWEATING *NAUSEA AND VOMITING THAT IS NOT CONTROLLED WITH YOUR NAUSEA MEDICATION *UNUSUAL SHORTNESS OF BREATH *UNUSUAL BRUISING OR BLEEDING *URINARY PROBLEMS (pain or burning when urinating, or frequent urination) *BOWEL PROBLEMS (unusual diarrhea, constipation, pain near the anus) TENDERNESS IN MOUTH AND THROAT WITH OR WITHOUT PRESENCE OF ULCERS (sore throat, sores in mouth, or a toothache) UNUSUAL RASH, SWELLING OR PAIN  UNUSUAL VAGINAL DISCHARGE OR ITCHING   Items with * indicate a potential emergency and should be followed up as soon as possible or go to the Emergency Department if any problems should occur.  Please show the CHEMOTHERAPY ALERT CARD or IMMUNOTHERAPY ALERT  CARD at check-in to the Emergency Department and triage nurse.  Should you have questions after your visit or need to cancel or reschedule your appointment, please contact [facility]  Dept: [phone]  and follow the prompts.  Office hours are 8:00 a.m. to 4:30 p.m. Monday - Friday. Please note that voicemails left after 4:00 p.m. may not be returned until the following business day.  We are closed weekends and major holidays. You have access to a nurse at all times for urgent questions. Please call the main number to the clinic Dept: [phone] and follow the prompts.   For any non-urgent questions, you may also contact your provider using MyChart. We now offer e-Visits for anyone 73 and older to request care online for non-urgent symptoms. For details visit [email or web address].   Also download the MyChart app! Go to the app store, search "MyChart", open the app, select [facility], and log in with your MyChart username and password.

## 2023-03-02 NOTE — Progress Notes (Signed)
[facility] OFFICE PROGRESS NOTE  Patient Care Team: Geoffry Paradise, MD as PCP - General (Internal Medicine) Leanora Ivanoff, RN as Registered Nurse  HISTORY OF PRESENTING ILLNESS: Discussed the use of AI scribe software for clinical note transcription with the patient, who gave verbal consent to proceed.  History of Present Illness   The patient, with a history of recurrent ovarian cancer, reports a slower recovery from her last treatment, experiencing increased neuropathy, predominantly in the feet. She describes the sensation as an ache rather than a burning sensation, which does not interfere with her sleep. Despite these symptoms, she has managed to maintain her exercise routine, walking two and a half miles daily.  She also reports intermittent swelling in her ankles, which she attributes to fluid retention. The swelling is not constant and seems to resolve on its own.  The patient is also on a steroid medication, with only four pills remaining. She expresses concern about having enough medication for her next treatment. She is also anticipating her next CT scan, expressing hope for positive results.  The patient inquires about the possibility of switching treatments in the future but expresses a preference for maintaining her current regimen if her next CT scan shows stable disease and her neuropathy remains manageable.         Assessment and Plan    Recurrent ovarian ca Patient is tolerating current chemotherapy regimen well with stable blood counts and liver/kidney function. Patient expressed concern about future treatment options. -Continue current chemotherapy regimen. -Plan for CT scan in November to assess treatment response. -Ensure patient has enough steroids for next treatment. -Discussed potential future treatment options if needed.    Chemotherapy-induced Peripheral Neuropathy Increased neuropathic sensations in feet, more than hands. No  interference with sleep. Patient is managing with exercise and cold therapy. -Continue current management strategies. -Continue to monitor for progression of symptoms.  Fluid Retention Intermittent ankle swelling, possibly related to fluid retention. No other symptoms suggestive of heart failure or kidney disease. -Continue to monitor for changes or worsening of symptoms.  General Health Maintenance -Continue exercise regimen as tolerated. -Continue cold therapy for neuropathy.          Orders Placed This Encounter  Procedures   CBC with Differential (Cancer Center Only)    Standing Status:   Future    Standing Expiration Date:   04/12/2024   CMP (Cancer Center only)    Standing Status:   Future    Standing Expiration Date:   04/12/2024    All questions were answered. The patient knows to call the clinic with any problems, questions or concerns. The total time spent in the appointment was 30 minutes encounter with patients including review of chart and various tests results, discussions about plan of care and coordination of care plan   Artis Delay, MD 03/02/2023 1:43 PM  REVIEW OF SYSTEMS:  All other systems were reviewed with the patient and are negative.  I have reviewed the past medical history, past surgical history, social history and family history with the patient and they are unchanged from previous note.  ALLERGIES:  is allergic to carboplatin, skin adhesives [cyanoacrylate], shrimp [shellfish allergy], sulfasalazine, sulfonamide derivatives, and telithromycin.  MEDICATIONS:  Current Outpatient Medications  Medication Sig Dispense Refill   ALPRAZolam (XANAX) 0.25 MG tablet Take 1 tablet by mouth at bedtime as needed for anxiety. 30 tablet 0   Calcium Citrate (CAL-CITRATE PO) 1 poq     Calcium-Magnesium-Vitamin D 200-100-33.3 MG-MG-UNIT CAPS  take one by mouth as needed Oral     carboxymethylcellulose (REFRESH PLUS) 0.5 % SOLN Place 1 drop into both eyes 2 (two)  times daily as needed (dry eyes).     Cholecalciferol (VITAMIN D3) 25 MCG (1000 UT) CAPS Take 1,000 Units by mouth daily.      dexamethasone (DECADRON) 4 MG tablet Take 2 tablets by mouth the night before and 2 tablets the morning of chemotherapy, every 3 weeks, by mouth x 6 cycles 24 tablet 0   diphenhydrAMINE (BENADRYL ALLERGY) 25 MG tablet Take by mouth.     escitalopram (LEXAPRO) 10 MG tablet 1 po qd Oral     esomeprazole (NEXIUM) 20 MG capsule Take 20 mg by mouth daily.      estradiol (ESTRACE VAGINAL) 0.1 MG/GM vaginal cream Place 1 applicatorful vaginally 3 (three) times a week. 42.5 g 12   furosemide (LASIX) 20 MG tablet 1 tablet Orally Once a day     hydrALAZINE (APRESOLINE) 10 MG tablet 1 tablet with food Orally two times a day     lidocaine-prilocaine (EMLA) cream Apply topically as needed. 30 g 0   lisinopril (ZESTRIL) 20 MG tablet Take 1 tablet (20 mg total) by mouth daily. 90 tablet 1   loratadine (CLARITIN) 10 MG tablet Take 10 mg by mouth daily as needed for allergies.     Multiple Vitamins-Minerals (MULTI FOR HER PO) 1 poq     mupirocin ointment (BACTROBAN) 2 % 1 application Externally Twice a day     ondansetron (ZOFRAN) 8 MG tablet Take 1 tablet by mouth 2 times daily as needed. Start on the third day after chemotherapy. 30 tablet 1   oxyCODONE-acetaminophen (PERCOCET/ROXICET) 5-325 MG tablet      pantoprazole (PROTONIX) 40 MG tablet 1 po qd on an empty stomach Oral     prochlorperazine (COMPAZINE) 10 MG tablet Take 1 tablet by mouth every 6 hours as needed (Nausea or vomiting). 30 tablet 1   No current facility-administered medications for this visit.   Facility-Administered Medications Ordered in Other Visits  Medication Dose Route Frequency Provider Last Rate Last Admin   dexamethasone (DECADRON) 10 mg in sodium chloride 0.9 % 50 mL IVPB  10 mg Intravenous Once [name], Ryker Sudbury, MD       famotidine (PEPCID) IVPB 20 mg premix  20 mg Intravenous Once [name], Shelvie Salsberry, MD 200 mL/hr  at 03/02/23 1327 20 mg at 03/02/23 1327   fosaprepitant (EMEND) 150 mg in sodium chloride 0.9 % 145 mL IVPB  150 mg Intravenous Once [name], Quintel Mccalla, MD       heparin lock flush 100 unit/mL  500 Units Intracatheter Once PRN [name], Landyn Lorincz, MD       PACLitaxel (TAXOL) 174 mg in sodium chloride 0.9 % 250 mL chemo infusion (> 80mg /m2)  105 mg/m2 (Treatment Plan Recorded) Intravenous Once [name], Darryon Bastin, MD       sodium chloride flush (NS) 0.9 % injection 10 mL  10 mL Intracatheter PRN [name], Laquia Rosano, MD        SUMMARY OF ONCOLOGIC HISTORY: Oncology History Overview Note  Her 2 neu 1+ MSI stable, serous, 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 14.7 cm poorly defined soft tissue mass in central pelvis suspicious for primary ovarian carcinoma, with uterine leiomyosarcoma considered a less likely differential diagnosis. 2. Moderate ascites and diffuse peritoneal carcinomatosis. 3. Several small uterine  fibroids.   11/13/2018 Tumor Marker   Patient's tumor was tested for the following markers: CA-125 Results of the tumor marker test revealed 1434   11/21/2018 Procedure   Successful ultrasound-guided diagnostic and therapeutic paracentesis yielding 3.1 liters of peritoneal fluid   11/21/2018 Pathology Results   PERITONEAL/ASCITIC FLUID(SPECIMEN 1 OF 1 COLLECTED 11/21/18): ADENOCARCINOMA. Specimen Clinical Information Pelvic mass suspicious for ovarian cancer Source Peritoneal/Ascitic Fluid, (specimen 1 of 1 collected 11/21/18) Gross Specimen: Received is/are 1000 ccs of dark amber fluid. (CM:cm) Prepared: # Smears: 0 # Concentration Technique Slides (i.e. 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   11/29/2018 Initial Diagnosis   Ovarian cancer (HCC)   11/29/2018 Cancer Staging    Staging form: Ovary, Fallopian Tube, and Primary Peritoneal Carcinoma, AJCC 8th Edition - Clinical: cT3, cN0, cM0 - Signed by Artis Delay, MD on 11/29/2018   12/03/2018 Procedure   Successful ultrasound-guided therapeutic paracentesis yielding 3.7 liters of peritoneal fluid.     12/06/2018 Procedure   Placement of a subcutaneous port device. Catheter tip at the SVC and right atrium junction   12/14/2018 Procedure   Successful ultrasound-guided paracentesis yielding 2.9 L of peritoneal fluid   12/28/2018 Tumor Marker   Patient's tumor was tested for the following markers: CA-125 Results of the tumor marker test revealed 812.   12/28/2018 - 04/22/2019 Chemotherapy   The patient had carboplatin and taxol for neoadjuvant treatment, followed by interval debulking surgery and subsequent adjuvant chemotherapy treatment.     02/01/2019 Imaging   Ct abdomen and pelvis 8.6 cm left ovarian mass, corresponding to the patient's known primary neoplasm, improved.   Mild peritoneal nodularity/omental caking, improved.   Small abdominopelvic ascites, improved.     02/01/2019 Tumor Marker   Patient's tumor was tested for the following markers: CA-125 Results of the tumor marker test revealed 183.   02/12/2019 Surgery   Preoperative Diagnosis: Stage IIIC ovarian cancer, s/p neoadjuvant chemotherapy      Procedure(s) Performed: 1. Exploratory laparotomy with total abdominal hysterectomy, bilateral salpingo-oophorectomy, omentectomy radical tumor debulking for ovarian cancer.   Surgeon: Luisa Dago, MD.    Operative Findings: upper abdomen free of disease. No visible omental disease. Small volume (200cc) ascites. 8cm friable mass replacing left ovary and adherent to the sigmoid colon mesentery and ureter on the left.  Anterior fibroid. This represented an optimal cytoreduction (R0) with no gross visible disease remaining.    02/12/2019 Pathology Results   A. OVARY AND FALLOPIAN TUBE, LEFT,  SALPINGO-OOPHORECTOMY: - Serous carcinoma, high grade, status post neoadjuvant therapy - See oncology table and comment below B. UTERUS CERVIX WITH RIGHT FALLOPIAN TUBE AND OVARY, HYSTERECTOMY: Uterus: - Serosal surface involved by serous carcinoma - Endomyometrium uninvolved by carcinoma - Benign endometrial polyp (4.1 cm) - Leiomyomata (5.5 cm; largest) - Adenomyosis Cervix: - Uninvolved by carcinoma Left ovary: - Serous carcinoma, high grade Left fallopian tube: - Serous carcinoma, high grade C. SOFT TISSUE, LEFT PELVIC SIDEWALL TUMOR, EXCISION: - Metastatic serous carcinoma, high-grade D. SOFT TISSUE, SIGMOID COLON MESENTERY, EXCISION: - Metastatic serous carcinoma, high-grade E. OMENTUM, TUMOR RESECTION: - Metastatic serous carcinoma, high-grade OVARY or FALLOPIAN TUBE or PRIMARY PERITONEUM: Procedure: Total hysterectomy and bilateral salpingo-oophorectomy, omentectomy and peritoneal biopsies Specimen Integrity: Fragmented Tumor Site: Left ovary and fallopian tube Ovarian Surface Involvement: Present Fallopian Tube Surface Involvement: Present Tumor Size: 6.3 cm (see comment) Histologic Type: Serous carcinoma Histologic Grade: High-grade Implants: Not applicable Other Tissue/  Organ Involvement: Right ovary, right fallopian tube, omentum, mesentery Largest Extrapelvic Peritoneal Focus: Macroscopic Peritoneal/Ascitic Fluid: Malignant Treatment Effect: No definite or minimal response identified (chemotherapy response score 1 [CRS1] Regional Lymph Nodes: No lymph nodes submitted or found Pathologic Stage Classification (pTNM, AJCC 8th Edition): pT3c, pNX Representative Tumor Block: A4 Comment(s): Additional testing (HER-2, MMR and MSI) are pending. The primary tumor site appears to be the left ovary and fallopian tube. The uterus is only involved on the serosal surface. There is tumor on the anterior peritoneal reflection.  Addendum: Tumor is Her2 negative,MSI stable    02/19/2019 Tumor Marker   Patient's tumor was tested for the following markers: CA-125 Results of the tumor marker test revealed 40.3   04/01/2019 Tumor Marker   Patient's tumor was tested for the following markers: CA-125 Results of the tumor marker test revealed 8.1    Genetic Testing   Negative testing. No pathogenic variants identified on the [employer]. The report date is 04/17/2019.  Somatic genes analyzed through [facility]: ATM, BARD1, BRCA1, BRCA2, BRIP1, CHEK2, MRE11A, NBN, PALB2, RAD51C, RAD51D.  The [facility] gene panel offered by [employer] includes sequencing and rearrangement analysis for the following 36 genes: APC*, ATM*, AXIN2, BARD1, BMPR1A, BRCA1*, BRCA2*, BRIP1*, CDH1*, CDK4, CDKN2A, CHEK2*, DICER1, MLH1*, MSH2*, MSH3, MSH6*, MUTYH*, NBN, NF1*, NTHL1, PALB2*, PMS2*, PTEN*, RAD51C*, RAD51D*, RECQL, SMAD4, SMARCA4, STK11 and TP53* (sequencing and deletion/duplication); HOXB13, POLD1 and POLE (sequencing only); EPCAM and GREM1 (deletion/duplication only).    05/21/2019 Imaging   1. Interval hysterectomy, oophorectomy and omentectomy. No evidence of residual ovarian carcinoma in the pelvis. No evidence of peritoneal disease. No intraperitoneal free fluid. 2. Nonobstructing LEFT renal calculi.  Normal ureters.   05/22/2019 Tumor Marker   Patient's tumor was tested for the following markers: CA-125 Results of the tumor marker test revealed 5.7.   06/03/2019 Procedure   Successful right IJ vein Port-A-Cath explant.   08/26/2019 Tumor Marker   Patient's tumor was tested for the following markers: CA-125. Results of the tumor marker test revealed 4.9   10/03/2019 Imaging   1. New tumor deposits along the capsular surfaces of the liver, spleen, in the left pelvis, and right paracolic gutter, compatible with metastatic disease. 2. Mild dilation of the dorsal pancreatic duct in the pancreatic body and head, cause uncertain. 3. Nonobstructive  left nephrolithiasis. 4. Aortic atherosclerosis.   Aortic Atherosclerosis (ICD10-I70.0   10/10/2019 Procedure   Successful placement of a right IJ approach Power Port with ultrasound and fluoroscopic guidance. The catheter is ready for use.     10/11/2019 Tumor Marker   Patient's tumor was tested for the following markers: CA-125 Results of the tumor marker test revealed 19.6.   10/14/2019 -  Chemotherapy   The patient had carboplatin and gemzar for chemotherapy treatment.     11/04/2019 Tumor Marker   Patient's tumor was tested for the following markers: CA-125 Results of the tumor marker test revealed 11.4   11/28/2019 Tumor Marker   Patient's tumor was tested for the following markers: CA-125 Results of the tumor marker test revealed 5.8   12/20/2019 Imaging   1. Significant interval reduction in mixed solid and cystic nodule in the vicinity of the left ovary, with significant improvement or resolution of multiple pelvic, peritoneal, and organ capsule implants. Resolution of previously seen small volume ascites. Findings are consistent with treatment response of abdominal metastatic disease. 2. Status post hysterectomy, oophorectomy, and omentectomy. 3. Nonobstructive left nephrolithiasis. 4. Aortic Atherosclerosis (ICD10-I70.0).   12/23/2019  Tumor Marker   Patient's tumor was tested for the following markers: CA-125 Results of the tumor marker test revealed 5.0   01/24/2020 Tumor Marker   Patient's tumor was tested for the following markers: CA-125 Results of the tumor marker test revealed 4.8   02/24/2020 Tumor Marker   Patient's tumor was tested for the following markers: CA-125. Results of the tumor marker test revealed 4.3.   03/06/2020 Imaging   1. Status post hysterectomy, bilateral oophorectomy, and omentectomy. 2. Resolution of previously described left adnexal nodularity. 3. No residual disease identified. 4.  Aortic Atherosclerosis (ICD10-I70.0). 5. Left  nephrolithiasis.   04/20/2020 Tumor Marker   Patient's tumor was tested for the following markers: CA-125 Results of the tumor marker test revealed 4.6   05/18/2020 Tumor Marker   Patient's tumor was tested for the following markers: CA-125 Results of the tumor marker test revealed 4.7   05/29/2020 Imaging   Status post hysterectomy and bilateral salpingo oophorectomy.   No evidence of recurrent or metastatic disease.   06/08/2020 - 09/13/2021 Chemotherapy   She was on niraparib       06/17/2020 Tumor Marker   Patient's tumor was tested for the following markers: CA-125 Results of the tumor marker test revealed 4.5   08/04/2020 Tumor Marker   Patient's tumor was tested for the following markers: CA-125. Results of the tumor marker test revealed 5.7   09/07/2020 Imaging   1. Status post hysterectomy and bilateral salpingo oophorectomy. No evidence of recurrent or metastatic disease. 2. Nonobstructing left nephrolithiasis. 3. Left-sided colonic diverticulosis without findings of acute diverticulitis. 4. Aortic atherosclerosis.     09/07/2020 Tumor Marker   Patient's tumor was tested for the following markers: CA-125 Results of the tumor marker test revealed 4.8.   10/21/2020 Tumor Marker   Patient's tumor was tested for the following markers: CA-125. Results of the tumor marker test revealed 5.1.   12/14/2020 Tumor Marker   Patient's tumor was tested for the following markers: CA-125. Results of the tumor marker test revealed 5.5.   04/05/2021 Tumor Marker   Patient's tumor was tested for the following markers: CA-125. Results of the tumor marker test revealed 5.9.   05/31/2021 Tumor Marker   Patient's tumor was tested for the following markers: CA-125. Results of the tumor marker test revealed 8.1.   09/13/2021 Imaging   1. Interval development of multiple new cystic and solid lesions in the abdomen and pelvis measuring up to 10.3 x 7.2 cm and consistent with peritoneal  metastatic disease. 2. Nonobstructing left renal stones. 3. Aortic Atherosclerosis (ICD10-I70.0).     09/14/2021 Tumor Marker   Patient's tumor was tested for the following markers: CA-125. Results of the tumor marker test revealed 28.5.   09/16/2021 Echocardiogram   1. Left ventricular ejection fraction, by estimation, is 60 to 65%. The left ventricle has normal function. The left ventricle has no regional wall motion abnormalities. Left ventricular diastolic parameters are consistent with Grade I diastolic dysfunction (impaired relaxation).  2. Right ventricular systolic function is normal. The right ventricular size is normal.  3. The mitral valve is normal in structure. Trivial mitral valve regurgitation. No evidence of mitral stenosis.  4. The aortic valve has an indeterminant number of cusps. Aortic valve regurgitation is trivial. No aortic stenosis is present.  5. The inferior vena cava is normal in size with greater than 50% respiratory variability, suggesting right atrial pressure of 3 mmHg.     09/20/2021 - 01/17/2022 Chemotherapy   Patient  is on Treatment Plan : OVARIAN RECURRENT Liposomal Doxorubicin + Carboplatin q28d X 6 Cycles     09/20/2021 - 02/22/2022 Chemotherapy   Patient is on Treatment Plan : OVARIAN RECURRENT Liposomal Doxorubicin + Carboplatin q28d X 6 Cycles     09/23/2021 Tumor Marker   Patient's tumor was tested for the following markers: CA-125. Results of the tumor marker test revealed 27.7.   11/24/2021 Tumor Marker   Patient's tumor was tested for the following markers: CA-125. Results of the tumor marker test revealed 28.4.   12/13/2021 Imaging   1. Overall stable peritoneal carcinomatosis. No new disease is demonstrated. 2. Moderate left-sided hydroureteronephrosis likely due to compression of the left ureter by the left pelvic mass. 3. Stable left-sided renal calculi. 4. Numerous left renal calculi.     12/22/2021 Tumor Marker   Patient's tumor was tested for  the following markers: CA-125. Results of the tumor marker test revealed 24.7.   01/18/2022 Tumor Marker   Patient's tumor was tested for the following markers: CA-125. Results of the tumor marker test revealed 9.9.   01/27/2022 Echocardiogram    1. Left ventricular ejection fraction, by estimation, is 60 to 65%. The left ventricle has normal function. The left ventricle has no regional wall motion abnormalities. Left ventricular diastolic parameters are consistent with Grade I diastolic dysfunction (impaired relaxation). The average left ventricular global longitudinal strain is -21.1 %. The global longitudinal strain is normal.  2. Right ventricular systolic function is normal. The right ventricular size is normal.  3. The mitral valve is abnormal. Mild mitral valve regurgitation.  4. The aortic valve is tricuspid. Aortic valve regurgitation is trivial.  5. The inferior vena cava is normal in size with greater than 50% respiratory variability, suggesting right atrial pressure of 3 mmHg.   02/23/2022 Tumor Marker   Patient's tumor was tested for the following markers: CA-125. Results of the tumor marker test revealed 8.5.   03/18/2022 Imaging   1. Diminished size of nodular and mixed solid and cystic peritoneal implants in the hepatorenal fossa, right lower quadrant, and left pelvis. 2. Unchanged solid mass in the right pelvis measuring 4.0 x 3.7 cm. 3. Constellation of findings is overall consistent with treatment response 4. Status post hysterectomy, oophorectomy, and omentectomy. 5. Nonobstructive left nephrolithiasis.   Aortic Atherosclerosis (ICD10-I70.0).     03/22/2022 - 08/16/2022 Chemotherapy   Patient is on Treatment Plan : Ovarian Bevacizumab q21d     03/24/2022 Tumor Marker   Patient's tumor was tested for the following markers: CA-125. Results of the tumor marker test revealed 7.1.   04/28/2022 Tumor Marker   Patient's tumor was tested for the following markers:  CA-125. Results of the tumor marker test revealed 7.0.   05/27/2022 Tumor Marker   Patient's tumor was tested for the following markers: CA-125. Results of the tumor marker test revealed 7.7.   06/14/2022 Imaging   1. Slight response to therapy as evidenced by decrease in size of a cystic and solid mass in the left adnexa as well as peritoneal implants in the right paracolic gutter. Solid right adnexal mass and additional peritoneal implants along right hepatic lobe and in the left anatomic pelvis appear stable. 2. Hepatic steatosis. 3. Left renal stones. 4.  Aortic atherosclerosis (ICD10-I70.0).   06/17/2022 Tumor Marker   Patient's tumor was tested for the following markers: CA-125. Results of the tumor marker test revealed 11.2.   09/05/2022 Imaging   CT ABDOMEN PELVIS W CONTRAST  Result Date: 09/06/2022  CLINICAL DATA:  Ovarian cancer.  Restaging.  * Tracking Code: BO * EXAM: CT ABDOMEN AND PELVIS WITH CONTRAST TECHNIQUE: Multidetector CT imaging of the abdomen and pelvis was performed using the standard protocol following bolus administration of intravenous contrast. RADIATION DOSE REDUCTION: This exam was performed according to the departmental dose-optimization program which includes automated exposure control, adjustment of the mA and/or kV according to patient size and/or use of iterative reconstruction technique. CONTRAST:  OMNIPAQUE IOHEXOL 300 MG/ML  SOLN COMPARISON:  06/13/2022 FINDINGS: Lower chest: Unremarkable. Hepatobiliary: No suspicious focal abnormality within the liver parenchyma. There is no evidence for gallstones, gallbladder wall thickening, or pericholecystic fluid. No intrahepatic or extrahepatic biliary dilation. Pancreas: No pancreatic mass lesion evident. Diffuse prominence of the main pancreatic duct is stable measuring up to 3-4 mm diameter in the head of the pancreas. Spleen: No splenomegaly. No focal mass lesion. Adrenals/Urinary Tract: No adrenal nodule or mass.  Right kidney unremarkable. A cluster of nonobstructing stones in the upper pole right kidney is again noted with dominant stone measuring about 6 mm in size. No evidence for hydroureter. The urinary bladder appears normal for the degree of distention. Stomach/Bowel: Tiny hiatal hernia. Stomach otherwise unremarkable. Duodenum is normally positioned as is the ligament of Treitz. No small bowel wall thickening. No small bowel dilatation. The terminal ileum is normal. The appendix is normal. No gross colonic mass. No colonic wall thickening. Diverticular changes are noted in the left colon without evidence of diverticulitis. Vascular/Lymphatic: There is mild atherosclerotic calcification of the abdominal aorta without aneurysm. There is no gastrohepatic or hepatoduodenal ligament lymphadenopathy. No retroperitoneal or mesenteric lymphadenopathy. No pelvic sidewall lymphadenopathy. Reproductive: Right-sided adnexal mass measured previously at 4.2 x 3.8 cm is now 4.9 x 4.6 cm. Cystic and solid left adnexal lesion measured previously at 4.1 x 3.4 cm is now 3.2 x 2.9 cm. 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 The more anterior of the 2 index lesions measures 2.6 x 2.1 cm today compared to 2.4 x 1.6 cm previously. The more posterior lesion is 2.4 x 1.4 cm today compared to 2.0 x 1.9 cm previously. 5 mm soft tissue nodule identified in the inferior paracolic gutter previously at 5 mm measures 10 mm today on image 51/2. Implants along the left posterior peritoneum adjacent to the psoas muscle and iliac vessels appear progressive. 1.6 x 1.3 cm lesion visible on 58/2 was 1.1 x 0.8 cm previously (remeasured). No evidence for ascites. Musculoskeletal: No worrisome lytic or sclerotic osseous abnormality. IMPRESSION: 1. Overall generalized appearance of mild disease progression. 2. Index right-sided adnexal mass has progressed since the prior with interval decrease in size of the cystic and solid left  adnexal lesion. 3. Interval progression of index capsular implants along the medial right liver. 4. Interval progression of soft tissue nodule in the inferior right paracolic gutter and peritoneal implants in the lower left abdomen and pelvis. 5. No evidence for ascites. 6. Nonobstructing left renal stones. 7. Tiny hiatal hernia. 8. Left colonic diverticulosis without diverticulitis. 9.  Aortic Atherosclerosis (ICD10-I70.0). Electronically Signed   By: Kennith Center M.D.   On: 09/06/2022 07:57      09/15/2022 -  Chemotherapy   Patient is on Treatment Plan : OVARIAN Paclitaxel (175) q21d X 6 Cycles     11/16/2022 Imaging   CT ABDOMEN PELVIS W CONTRAST  Result Date: 11/16/2022 CLINICAL DATA:  Ovarian carcinoma. Assess treatment response. * Tracking Code: BO * EXAM: CT ABDOMEN AND PELVIS WITH  CONTRAST TECHNIQUE: Multidetector CT imaging of the abdomen and pelvis was performed using the standard protocol following bolus administration of intravenous contrast. RADIATION DOSE REDUCTION: This exam was performed according to the departmental dose-optimization program which includes automated exposure control, adjustment of the mA and/or kV according to patient size and/or use of iterative reconstruction technique. CONTRAST:  OMNIPAQUE IOHEXOL 300 MG/ML  SOLN COMPARISON:  None Available. FINDINGS: Lower chest: Lung bases are clear. Hepatobiliary: No focal hepatic lesion. Normal gallbladder. No biliary duct dilatation. Common bile duct is normal. Peritoneal implant described along the margin the RIGHT hepatic liver is much less conspicuous. Capsular lesion measuring 12 mm on image 14/2 is decreased from 26 mm. Pancreas: Pancreas is normal. No ductal dilatation. No pancreatic inflammation. Spleen: Normal spleen Adrenals/urinary tract: Adrenal glands normal. Multiple calculi within LEFT kidney without obstruction. Ureters and bladder normal. Stomach/Bowel: Stomach, small bowel, appendix, and cecum are normal.  Multiple diverticula of the descending colon and sigmoid colon without acute inflammation. Vascular/Lymphatic: Abdominal aorta is normal caliber. No periportal or retroperitoneal adenopathy. No pelvic adenopathy. Reproductive: Solid enhancing mass in the deep RIGHT pelvis adjacent the rectum measures 4.0 x 4.3 cm compared to 4.6 x 4.9 cm. Small adjacent satellite nodule measuring 1.1 cm (image 63) is unchanged. Cystic lesion of the LEFT adnexa with mural nodularity measures 3.0 x 2.4 cm compared to 3.2 x 2.9 cm. No new nodularity in the pelvis. Post hysterectomy. Other: Interval decrease in the peritoneal nodularity. For example peritoneal nodule adjacent to the cecum in the RIGHT iliac fossa measures 6 mm (image 43/2 compared to 10 mm on prior. Lesions on the LEFT iliac vessels less prominent. For example 7 mm lesion on 50/2 decreased from 16 mm. Musculoskeletal: No aggressive osseous lesion. IMPRESSION: 1. Interval decrease in size of RIGHT pelvic mass. 2. Interval decrease in size of LEFT adnexal cystic mass. 3. Interval decrease in size of peritoneal nodularity. 4. Interval decrease in size of perihepatic peritoneal implant. 5. No new metastatic disease. Electronically Signed   By: Genevive Bi M.D.   On: 11/16/2022 14:34      02/08/2023 Imaging   CT ABDOMEN PELVIS W CONTRAST  Result Date: 02/08/2023 CLINICAL DATA:  Ovarian carcinoma.  Assess treatment response. EXAM: CT ABDOMEN AND PELVIS WITH CONTRAST TECHNIQUE: Multidetector CT imaging of the abdomen and pelvis was performed using the standard protocol following bolus administration of intravenous contrast. RADIATION DOSE REDUCTION: This exam was performed according to the departmental dose-optimization program which includes automated exposure control, adjustment of the mA and/or kV according to patient size and/or use of iterative reconstruction technique. CONTRAST:  OMNIPAQUE IOHEXOL 300 MG/ML  SOLN COMPARISON:  CT 11/14/2022 FINDINGS: Lower  chest: Lung bases are clear. Hepatobiliary: No focal hepatic lesion. Normal gallbladder. No biliary duct dilatation. Common bile duct is normal. Small peritoneal implant along the RIGHT hepatic lobe is less conspicuous at 6 mm (image 18/2) compared to 11 mm on prior. Pancreas: Pancreas is normal. No ductal dilatation. No pancreatic inflammation. Spleen: Normal spleen Adrenals/urinary tract: Adrenal glands normal. Multiple nonobstructing calculi in the upper pole of the LEFT kidney. Ureters and bladder normal. Stomach/Bowel: Stomach, small bowel, appendix, and cecum are normal. The colon and rectosigmoid colon are normal. Vascular/Lymphatic: Abdominal aorta is normal caliber with atherosclerotic calcification. There is no retroperitoneal or periportal lymphadenopathy. No pelvic lymphadenopathy. Reproductive: Post hysterectomy anatomy. Again demonstrated rounded mass posterior to the vaginal cuff just RIGHT of midline measures 3.7 by 3.0 cm (image 69/series 2) compared to  4.3 x 4.0 cm on most recent CT. Part cystic and solid lesion in the LEFT adnexa measures 2.2 x 1.6 cm (image 65) compared to 3.0 x 2.2 cm. Previously measured peritoneal implant in the deep RIGHT pelvis is no longer identified. No new mass lesions within the pelvis. Other: No new peritoneal disease.  No free fluid. Musculoskeletal: No aggressive osseous lesion. IMPRESSION: 1. Interval decrease in size rounded mass in the deep RIGHT pelvis. 2. Interval decrease in size of cystic LEFT adnexal lesion. 3. Decrease in size of peritoneal implants. 4. No evidence of new metastatic disease. No intraperitoneal free fluid Electronically Signed   By: Genevive Bi M.D.   On: 02/08/2023 10:56        PHYSICAL EXAMINATION: ECOG PERFORMANCE STATUS: 1 - Symptomatic but completely ambulatory  Vitals:   03/02/23 1227  BP: (!) 153/86  Pulse: 83  Resp: 18  SpO2: 100%   Filed Weights   03/02/23 1227  Weight: 142 lb 6.4 oz (64.6 kg)     GENERAL:alert, no distress and comfortable  LABORATORY DATA:  I have reviewed the data as listed    Component Value Date/Time   NA 140 03/02/2023 1150   K 3.8 03/02/2023 1150   CL 105 03/02/2023 1150   CO2 26 03/02/2023 1150   GLUCOSE 143 (H) 03/02/2023 1150   BUN 13 03/02/2023 1150   CREATININE 0.54 03/02/2023 1150   CALCIUM 10.0 03/02/2023 1150   PROT 7.0 03/02/2023 1150   ALBUMIN 4.5 03/02/2023 1150   AST 32 03/02/2023 1150   ALT 35 03/02/2023 1150   ALKPHOS 94 03/02/2023 1150   BILITOT 0.8 03/02/2023 1150   GFRNONAA >60 03/02/2023 1150   GFRAA >60 02/24/2020 0852    No results found for: "SPEP", "UPEP"  Lab Results  Component Value Date   WBC 8.3 03/02/2023   NEUTROABS 7.7 03/02/2023   HGB 12.5 03/02/2023   HCT 37.4 03/02/2023   MCV 101.6 (H) 03/02/2023   PLT 213 03/02/2023      Chemistry      Component Value Date/Time   NA 140 03/02/2023 1150   K 3.8 03/02/2023 1150   CL 105 03/02/2023 1150   CO2 26 03/02/2023 1150   BUN 13 03/02/2023 1150   CREATININE 0.54 03/02/2023 1150      Component Value Date/Time   CALCIUM 10.0 03/02/2023 1150   ALKPHOS 94 03/02/2023 1150   AST 32 03/02/2023 1150   ALT 35 03/02/2023 1150   BILITOT 0.8 03/02/2023 1150

## 2023-03-07 ENCOUNTER — Other Ambulatory Visit: Payer: Self-pay

## 2023-03-22 ENCOUNTER — Encounter: Payer: Medicare Other | Admitting: Physician Assistant

## 2023-03-22 ENCOUNTER — Other Ambulatory Visit: Payer: Self-pay

## 2023-03-22 ENCOUNTER — Ambulatory Visit (HOSPITAL_COMMUNITY)
Admission: RE | Admit: 2023-03-22 | Discharge: 2023-03-22 | Disposition: A | Discharge status: Home / Self Care | Payer: Medicare Other | Source: Ambulatory Visit | Attending: Physician Assistant | Admitting: Physician Assistant

## 2023-03-22 ENCOUNTER — Other Ambulatory Visit (HOSPITAL_COMMUNITY): Payer: Self-pay

## 2023-03-22 ENCOUNTER — Telehealth: Payer: Self-pay | Admitting: *Deleted

## 2023-03-22 ENCOUNTER — Inpatient Hospital Stay (HOSPITAL_BASED_OUTPATIENT_CLINIC_OR_DEPARTMENT_OTHER): Payer: Medicare Other | Admitting: Physician Assistant

## 2023-03-22 VITALS — BP 145/78 | HR 84 | Temp 98.3°F | Resp 16 | Wt 145.8 lb

## 2023-03-22 DIAGNOSIS — U071 COVID-19: Secondary | ICD-10-CM | POA: Diagnosis not present

## 2023-03-22 DIAGNOSIS — C562 Malignant neoplasm of left ovary: Secondary | ICD-10-CM | POA: Diagnosis not present

## 2023-03-22 DIAGNOSIS — R051 Acute cough: Secondary | ICD-10-CM | POA: Insufficient documentation

## 2023-03-22 DIAGNOSIS — Z7189 Other specified counseling: Secondary | ICD-10-CM | POA: Diagnosis not present

## 2023-03-22 DIAGNOSIS — Z5111 Encounter for antineoplastic chemotherapy: Secondary | ICD-10-CM | POA: Diagnosis not present

## 2023-03-22 LAB — CBC WITH DIFFERENTIAL (CANCER CENTER ONLY)
Abs Immature Granulocytes: 0.01 10*3/uL (ref 0.00–0.07)
Basophils Absolute: 0 10*3/uL (ref 0.0–0.1)
Basophils Relative: 0 %
Eosinophils Absolute: 0 10*3/uL (ref 0.0–0.5)
Eosinophils Relative: 1 %
HCT: 34.3 % — ABNORMAL LOW (ref 36.0–46.0)
Hemoglobin: 11.5 g/dL — ABNORMAL LOW (ref 12.0–15.0)
Immature Granulocytes: 0 %
Lymphocytes Relative: 12 %
Lymphs Abs: 0.6 10*3/uL — ABNORMAL LOW (ref 0.7–4.0)
MCH: 33.1 pg (ref 26.0–34.0)
MCHC: 33.5 g/dL (ref 30.0–36.0)
MCV: 98.8 fL (ref 80.0–100.0)
Monocytes Absolute: 0.7 10*3/uL (ref 0.1–1.0)
Monocytes Relative: 14 %
Neutro Abs: 3.8 10*3/uL (ref 1.7–7.7)
Neutrophils Relative %: 73 %
Platelet Count: 195 10*3/uL (ref 150–400)
RBC: 3.47 MIL/uL — ABNORMAL LOW (ref 3.87–5.11)
RDW: 13.9 % (ref 11.5–15.5)
WBC Count: 5.2 10*3/uL (ref 4.0–10.5)
nRBC: 0 % (ref 0.0–0.2)

## 2023-03-22 LAB — RESP PANEL BY RT-PCR (RSV, FLU A&B, COVID)  RVPGX2
Influenza A by PCR: NEGATIVE
Influenza B by PCR: NEGATIVE
Resp Syncytial Virus by PCR: NEGATIVE
SARS Coronavirus 2 by RT PCR: POSITIVE — AB

## 2023-03-22 LAB — CMP (CANCER CENTER ONLY)
ALT: 57 U/L — ABNORMAL HIGH (ref 0–44)
AST: 53 U/L — ABNORMAL HIGH (ref 15–41)
Albumin: 4 g/dL (ref 3.5–5.0)
Alkaline Phosphatase: 110 U/L (ref 38–126)
Anion gap: 6 (ref 5–15)
BUN: 8 mg/dL (ref 8–23)
CO2: 27 mmol/L (ref 22–32)
Calcium: 9 mg/dL (ref 8.9–10.3)
Chloride: 107 mmol/L (ref 98–111)
Creatinine: 0.51 mg/dL (ref 0.44–1.00)
GFR, Estimated: >60 mL/min (ref >60)
Glucose, Bld: 107 mg/dL — ABNORMAL HIGH (ref 70–99)
Potassium: 3.6 mmol/L (ref 3.5–5.1)
Sodium: 140 mmol/L (ref 135–145)
Total Bilirubin: 1 mg/dL (ref 0.3–1.2)
Total Protein: 6.4 g/dL — ABNORMAL LOW (ref 6.5–8.1)

## 2023-03-22 MED ORDER — SODIUM CHLORIDE 0.9% FLUSH
10.0000 mL | Freq: Once | INTRAVENOUS | Status: AC
  Administered 2023-03-22: 10 mL

## 2023-03-22 MED ORDER — PAXLOVID (300/100) 20 X 150 MG & 10 X 100MG PO TBPK
3.0000 | ORAL_TABLET | Freq: Two times a day (BID) | ORAL | 0 refills | Status: AC
  Filled 2023-03-22: qty 30, 5d supply, fill #0

## 2023-03-22 MED ORDER — HEPARIN SOD (PORK) LOCK FLUSH 100 UNIT/ML IV SOLN
500.0000 [IU] | Freq: Once | INTRAVENOUS | Status: AC
  Administered 2023-03-22: 500 [IU]

## 2023-03-22 MED FILL — Fosaprepitant Dimeglumine For IV Infusion 150 MG (Base Eq): INTRAVENOUS | Qty: 5 | Status: AC

## 2023-03-22 NOTE — Progress Notes (Deleted)
Symptom Management Consult Note [facility]    Patient Care Team: Geoffry Paradise, MD as PCP - General (Internal Medicine) Leanora Ivanoff, RN as Registered Nurse    Name / MRN / DOB: Stacy Rivas  [id number]  1955/12/03   Date of visit: 03/22/2023   Chief Complaint/Reason for visit: cough   Current Therapy: Taxol  Last treatment:  Day 1   Cycle 9 on 03/02/23   ASSESSMENT & PLAN: Patient is a 67 y.o. 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 [name].  I have viewed most recent oncology note and lab work.    #Left ovarian epithelial cancer  - Next appointment with oncologist is 03/23/23   #     Strict ED precautions discussed should symptoms worsen.   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 14.7 cm poorly defined soft tissue mass in central pelvis suspicious for primary ovarian carcinoma, with uterine leiomyosarcoma considered a less likely differential diagnosis. 2. Moderate ascites and diffuse peritoneal carcinomatosis. 3. Several small uterine fibroids.   11/13/2018 Tumor Marker   Patient's tumor was tested for the following markers: CA-125 Results of the tumor marker test revealed 1434   11/21/2018 Procedure   Successful ultrasound-guided diagnostic and therapeutic paracentesis yielding 3.1 liters of peritoneal fluid   11/21/2018 Pathology Results   PERITONEAL/ASCITIC FLUID(SPECIMEN 1 OF 1 COLLECTED 11/21/18): ADENOCARCINOMA. Specimen Clinical Information Pelvic mass suspicious for ovarian cancer Source Peritoneal/Ascitic Fluid, (specimen 1 of 1 collected 11/21/18) Gross Specimen: Received is/are 1000 ccs of dark amber fluid. (CM:cm) Prepared: # Smears: 0 # Concentration Technique Slides  (i.e. 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   11/29/2018 Initial Diagnosis   Ovarian cancer (HCC)   11/29/2018 Cancer Staging   Staging form: Ovary, Fallopian Tube, and Primary Peritoneal Carcinoma, AJCC 8th Edition - Clinical: cT3, cN0, cM0 - Signed by Artis Delay, MD on 11/29/2018   12/03/2018 Procedure   Successful ultrasound-guided therapeutic paracentesis yielding 3.7 liters of peritoneal fluid.     12/06/2018 Procedure   Placement of a subcutaneous port device. Catheter tip at the SVC and right atrium junction   12/14/2018 Procedure   Successful ultrasound-guided paracentesis yielding 2.9 L of peritoneal fluid   12/28/2018 Tumor Marker   Patient's tumor was tested for the following markers: CA-125 Results of the tumor marker test revealed 812.   12/28/2018 - 04/22/2019 Chemotherapy   The patient had carboplatin and taxol for neoadjuvant treatment, followed by interval debulking surgery and subsequent adjuvant chemotherapy treatment.     02/01/2019 Imaging   Ct abdomen and pelvis 8.6 cm left ovarian mass, corresponding to the patient's known primary neoplasm, improved.   Mild peritoneal nodularity/omental caking, improved.   Small abdominopelvic ascites, improved.     02/01/2019 Tumor Marker   Patient's tumor was tested for the following markers: CA-125 Results of the tumor marker test revealed 183.   02/12/2019 Surgery   Preoperative Diagnosis: Stage IIIC ovarian cancer, s/p neoadjuvant chemotherapy      Procedure(s) Performed: 1. Exploratory laparotomy with total abdominal hysterectomy, bilateral salpingo-oophorectomy, omentectomy radical tumor debulking for ovarian cancer.   Surgeon: Luisa Dago, MD.    Operative Findings: upper abdomen  free of disease. No visible omental disease. Small volume (200cc) ascites. 8cm friable mass replacing left ovary and adherent to the sigmoid  colon mesentery and ureter on the left.  Anterior fibroid. This represented an optimal cytoreduction (R0) with no gross visible disease remaining.    02/12/2019 Pathology Results   A. OVARY AND FALLOPIAN TUBE, LEFT, SALPINGO-OOPHORECTOMY: - Serous carcinoma, high grade, status post neoadjuvant therapy - See oncology table and comment below B. UTERUS CERVIX WITH RIGHT FALLOPIAN TUBE AND OVARY, HYSTERECTOMY: Uterus: - Serosal surface involved by serous carcinoma - Endomyometrium uninvolved by carcinoma - Benign endometrial polyp (4.1 cm) - Leiomyomata (5.5 cm; largest) - Adenomyosis Cervix: - Uninvolved by carcinoma Left ovary: - Serous carcinoma, high grade Left fallopian tube: - Serous carcinoma, high grade C. SOFT TISSUE, LEFT PELVIC SIDEWALL TUMOR, EXCISION: - Metastatic serous carcinoma, high-grade D. SOFT TISSUE, SIGMOID COLON MESENTERY, EXCISION: - Metastatic serous carcinoma, high-grade E. OMENTUM, TUMOR RESECTION: - Metastatic serous carcinoma, high-grade OVARY or FALLOPIAN TUBE or PRIMARY PERITONEUM: Procedure: Total hysterectomy and bilateral salpingo-oophorectomy, omentectomy and peritoneal biopsies Specimen Integrity: Fragmented Tumor Site: Left ovary and fallopian tube Ovarian Surface Involvement: Present Fallopian Tube Surface Involvement: Present Tumor Size: 6.3 cm (see comment) Histologic Type: Serous carcinoma Histologic Grade: High-grade Implants: Not applicable Other Tissue/ Organ Involvement: Right ovary, right fallopian tube, omentum, mesentery Largest Extrapelvic Peritoneal Focus: Macroscopic Peritoneal/Ascitic Fluid: Malignant Treatment Effect: No definite or minimal response identified (chemotherapy response score 1 [CRS1] Regional Lymph Nodes: No lymph nodes submitted or found Pathologic Stage Classification (pTNM, AJCC 8th Edition): pT3c, pNX Representative Tumor Block: A4 Comment(s): Additional testing (HER-2, MMR and MSI) are pending. The primary  tumor site appears to be the left ovary and fallopian tube. The uterus is only involved on the serosal surface. There is tumor on the anterior peritoneal reflection.  Addendum: Tumor is Her2 negative,MSI stable   02/19/2019 Tumor Marker   Patient's tumor was tested for the following markers: CA-125 Results of the tumor marker test revealed 40.3   04/01/2019 Tumor Marker   Patient's tumor was tested for the following markers: CA-125 Results of the tumor marker test revealed 8.1    Genetic Testing   Negative testing. No pathogenic variants identified on the [employer]. The report date is 04/17/2019.  Somatic genes analyzed through [facility]: ATM, BARD1, BRCA1, BRCA2, BRIP1, CHEK2, MRE11A, NBN, PALB2, RAD51C, RAD51D.  The [facility] gene panel offered by [employer] includes sequencing and rearrangement analysis for the following 36 genes: APC*, ATM*, AXIN2, BARD1, BMPR1A, BRCA1*, BRCA2*, BRIP1*, CDH1*, CDK4, CDKN2A, CHEK2*, DICER1, MLH1*, MSH2*, MSH3, MSH6*, MUTYH*, NBN, NF1*, NTHL1, PALB2*, PMS2*, PTEN*, RAD51C*, RAD51D*, RECQL, SMAD4, SMARCA4, STK11 and TP53* (sequencing and deletion/duplication); HOXB13, POLD1 and POLE (sequencing only); EPCAM and GREM1 (deletion/duplication only).    05/21/2019 Imaging   1. Interval hysterectomy, oophorectomy and omentectomy. No evidence of residual ovarian carcinoma in the pelvis. No evidence of peritoneal disease. No intraperitoneal free fluid. 2. Nonobstructing LEFT renal calculi.  Normal ureters.   05/22/2019 Tumor Marker   Patient's tumor was tested for the following markers: CA-125 Results of the tumor marker test revealed 5.7.   06/03/2019 Procedure   Successful right IJ vein Port-A-Cath explant.   08/26/2019 Tumor Marker   Patient's tumor was tested for the following markers: CA-125. Results of the tumor marker test revealed 4.9   10/03/2019 Imaging   1. New tumor deposits along the capsular surfaces of the  liver, spleen, in the left pelvis, and right paracolic gutter, compatible with  metastatic disease. 2. Mild dilation of the dorsal pancreatic duct in the pancreatic body and head, cause uncertain. 3. Nonobstructive left nephrolithiasis. 4. Aortic atherosclerosis.   Aortic Atherosclerosis (ICD10-I70.0   10/10/2019 Procedure   Successful placement of a right IJ approach Power Port with ultrasound and fluoroscopic guidance. The catheter is ready for use.     10/11/2019 Tumor Marker   Patient's tumor was tested for the following markers: CA-125 Results of the tumor marker test revealed 19.6.   10/14/2019 -  Chemotherapy   The patient had carboplatin and gemzar for chemotherapy treatment.     11/04/2019 Tumor Marker   Patient's tumor was tested for the following markers: CA-125 Results of the tumor marker test revealed 11.4   11/28/2019 Tumor Marker   Patient's tumor was tested for the following markers: CA-125 Results of the tumor marker test revealed 5.8   12/20/2019 Imaging   1. Significant interval reduction in mixed solid and cystic nodule in the vicinity of the left ovary, with significant improvement or resolution of multiple pelvic, peritoneal, and organ capsule implants. Resolution of previously seen small volume ascites. Findings are consistent with treatment response of abdominal metastatic disease. 2. Status post hysterectomy, oophorectomy, and omentectomy. 3. Nonobstructive left nephrolithiasis. 4. Aortic Atherosclerosis (ICD10-I70.0).   12/23/2019 Tumor Marker   Patient's tumor was tested for the following markers: CA-125 Results of the tumor marker test revealed 5.0   01/24/2020 Tumor Marker   Patient's tumor was tested for the following markers: CA-125 Results of the tumor marker test revealed 4.8   02/24/2020 Tumor Marker   Patient's tumor was tested for the following markers: CA-125. Results of the tumor marker test revealed 4.3.   03/06/2020 Imaging   1. Status post  hysterectomy, bilateral oophorectomy, and omentectomy. 2. Resolution of previously described left adnexal nodularity. 3. No residual disease identified. 4.  Aortic Atherosclerosis (ICD10-I70.0). 5. Left nephrolithiasis.   04/20/2020 Tumor Marker   Patient's tumor was tested for the following markers: CA-125 Results of the tumor marker test revealed 4.6   05/18/2020 Tumor Marker   Patient's tumor was tested for the following markers: CA-125 Results of the tumor marker test revealed 4.7   05/29/2020 Imaging   Status post hysterectomy and bilateral salpingo oophorectomy.   No evidence of recurrent or metastatic disease.   06/08/2020 - 09/13/2021 Chemotherapy   She was on niraparib       06/17/2020 Tumor Marker   Patient's tumor was tested for the following markers: CA-125 Results of the tumor marker test revealed 4.5   08/04/2020 Tumor Marker   Patient's tumor was tested for the following markers: CA-125. Results of the tumor marker test revealed 5.7   09/07/2020 Imaging   1. Status post hysterectomy and bilateral salpingo oophorectomy. No evidence of recurrent or metastatic disease. 2. Nonobstructing left nephrolithiasis. 3. Left-sided colonic diverticulosis without findings of acute diverticulitis. 4. Aortic atherosclerosis.     09/07/2020 Tumor Marker   Patient's tumor was tested for the following markers: CA-125 Results of the tumor marker test revealed 4.8.   10/21/2020 Tumor Marker   Patient's tumor was tested for the following markers: CA-125. Results of the tumor marker test revealed 5.1.   12/14/2020 Tumor Marker   Patient's tumor was tested for the following markers: CA-125. Results of the tumor marker test revealed 5.5.   04/05/2021 Tumor Marker   Patient's tumor was tested for the following markers: CA-125. Results of the tumor marker test revealed 5.9.   05/31/2021 Tumor Marker  Patient's tumor was tested for the following markers: CA-125. Results of the tumor  marker test revealed 8.1.   09/13/2021 Imaging   1. Interval development of multiple new cystic and solid lesions in the abdomen and pelvis measuring up to 10.3 x 7.2 cm and consistent with peritoneal metastatic disease. 2. Nonobstructing left renal stones. 3. Aortic Atherosclerosis (ICD10-I70.0).     09/14/2021 Tumor Marker   Patient's tumor was tested for the following markers: CA-125. Results of the tumor marker test revealed 28.5.   09/16/2021 Echocardiogram   1. Left ventricular ejection fraction, by estimation, is 60 to 65%. The left ventricle has normal function. The left ventricle has no regional wall motion abnormalities. Left ventricular diastolic parameters are consistent with Grade I diastolic dysfunction (impaired relaxation).  2. Right ventricular systolic function is normal. The right ventricular size is normal.  3. The mitral valve is normal in structure. Trivial mitral valve regurgitation. No evidence of mitral stenosis.  4. The aortic valve has an indeterminant number of cusps. Aortic valve regurgitation is trivial. No aortic stenosis is present.  5. The inferior vena cava is normal in size with greater than 50% respiratory variability, suggesting right atrial pressure of 3 mmHg.     09/20/2021 - 01/17/2022 Chemotherapy   Patient is on Treatment Plan : OVARIAN RECURRENT Liposomal Doxorubicin + Carboplatin q28d X 6 Cycles     09/20/2021 - 02/22/2022 Chemotherapy   Patient is on Treatment Plan : OVARIAN RECURRENT Liposomal Doxorubicin + Carboplatin q28d X 6 Cycles     09/23/2021 Tumor Marker   Patient's tumor was tested for the following markers: CA-125. Results of the tumor marker test revealed 27.7.   11/24/2021 Tumor Marker   Patient's tumor was tested for the following markers: CA-125. Results of the tumor marker test revealed 28.4.   12/13/2021 Imaging   1. Overall stable peritoneal carcinomatosis. No new disease is demonstrated. 2. Moderate left-sided hydroureteronephrosis  likely due to compression of the left ureter by the left pelvic mass. 3. Stable left-sided renal calculi. 4. Numerous left renal calculi.     12/22/2021 Tumor Marker   Patient's tumor was tested for the following markers: CA-125. Results of the tumor marker test revealed 24.7.   01/18/2022 Tumor Marker   Patient's tumor was tested for the following markers: CA-125. Results of the tumor marker test revealed 9.9.   01/27/2022 Echocardiogram    1. Left ventricular ejection fraction, by estimation, is 60 to 65%. The left ventricle has normal function. The left ventricle has no regional wall motion abnormalities. Left ventricular diastolic parameters are consistent with Grade I diastolic dysfunction (impaired relaxation). The average left ventricular global longitudinal strain is -21.1 %. The global longitudinal strain is normal.  2. Right ventricular systolic function is normal. The right ventricular size is normal.  3. The mitral valve is abnormal. Mild mitral valve regurgitation.  4. The aortic valve is tricuspid. Aortic valve regurgitation is trivial.  5. The inferior vena cava is normal in size with greater than 50% respiratory variability, suggesting right atrial pressure of 3 mmHg.   02/23/2022 Tumor Marker   Patient's tumor was tested for the following markers: CA-125. Results of the tumor marker test revealed 8.5.   03/18/2022 Imaging   1. Diminished size of nodular and mixed solid and cystic peritoneal implants in the hepatorenal fossa, right lower quadrant, and left pelvis. 2. Unchanged solid mass in the right pelvis measuring 4.0 x 3.7 cm. 3. Constellation of findings is overall consistent with treatment  response 4. Status post hysterectomy, oophorectomy, and omentectomy. 5. Nonobstructive left nephrolithiasis.   Aortic Atherosclerosis (ICD10-I70.0).     03/22/2022 - 08/16/2022 Chemotherapy   Patient is on Treatment Plan : Ovarian Bevacizumab q21d     03/24/2022 Tumor Marker    Patient's tumor was tested for the following markers: CA-125. Results of the tumor marker test revealed 7.1.   04/28/2022 Tumor Marker   Patient's tumor was tested for the following markers: CA-125. Results of the tumor marker test revealed 7.0.   05/27/2022 Tumor Marker   Patient's tumor was tested for the following markers: CA-125. Results of the tumor marker test revealed 7.7.   06/14/2022 Imaging   1. Slight response to therapy as evidenced by decrease in size of a cystic and solid mass in the left adnexa as well as peritoneal implants in the right paracolic gutter. Solid right adnexal mass and additional peritoneal implants along right hepatic lobe and in the left anatomic pelvis appear stable. 2. Hepatic steatosis. 3. Left renal stones. 4.  Aortic atherosclerosis (ICD10-I70.0).   06/17/2022 Tumor Marker   Patient's tumor was tested for the following markers: CA-125. Results of the tumor marker test revealed 11.2.   09/05/2022 Imaging   CT ABDOMEN PELVIS W CONTRAST  Result Date: 09/06/2022 CLINICAL DATA:  Ovarian cancer.  Restaging.  * Tracking Code: BO * EXAM: CT ABDOMEN AND PELVIS WITH CONTRAST TECHNIQUE: Multidetector CT imaging of the abdomen and pelvis was performed using the standard protocol following bolus administration of intravenous contrast. RADIATION DOSE REDUCTION: This exam was performed according to the departmental dose-optimization program which includes automated exposure control, adjustment of the mA and/or kV according to patient size and/or use of iterative reconstruction technique. CONTRAST:  OMNIPAQUE IOHEXOL 300 MG/ML  SOLN COMPARISON:  06/13/2022 FINDINGS: Lower chest: Unremarkable. Hepatobiliary: No suspicious focal abnormality within the liver parenchyma. There is no evidence for gallstones, gallbladder wall thickening, or pericholecystic fluid. No intrahepatic or extrahepatic biliary dilation. Pancreas: No pancreatic mass lesion evident. Diffuse prominence of  the main pancreatic duct is stable measuring up to 3-4 mm diameter in the head of the pancreas. Spleen: No splenomegaly. No focal mass lesion. Adrenals/Urinary Tract: No adrenal nodule or mass. Right kidney unremarkable. A cluster of nonobstructing stones in the upper pole right kidney is again noted with dominant stone measuring about 6 mm in size. No evidence for hydroureter. The urinary bladder appears normal for the degree of distention. Stomach/Bowel: Tiny hiatal hernia. Stomach otherwise unremarkable. Duodenum is normally positioned as is the ligament of Treitz. No small bowel wall thickening. No small bowel dilatation. The terminal ileum is normal. The appendix is normal. No gross colonic mass. No colonic wall thickening. Diverticular changes are noted in the left colon without evidence of diverticulitis. Vascular/Lymphatic: There is mild atherosclerotic calcification of the abdominal aorta without aneurysm. There is no gastrohepatic or hepatoduodenal ligament lymphadenopathy. No retroperitoneal or mesenteric lymphadenopathy. No pelvic sidewall lymphadenopathy. Reproductive: Right-sided adnexal mass measured previously at 4.2 x 3.8 cm is now 4.9 x 4.6 cm. Cystic and solid left adnexal lesion measured previously at 4.1 x 3.4 cm is now 3.2 x 2.9 cm. 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 The more anterior of the 2 index lesions measures 2.6 x 2.1 cm today compared to 2.4 x 1.6 cm previously. The more posterior lesion is 2.4 x 1.4 cm today compared to 2.0 x 1.9 cm previously. 5 mm soft tissue nodule identified in the inferior paracolic gutter previously at  5 mm measures 10 mm today on image 51/2. Implants along the left posterior peritoneum adjacent to the psoas muscle and iliac vessels appear progressive. 1.6 x 1.3 cm lesion visible on 58/2 was 1.1 x 0.8 cm previously (remeasured). No evidence for ascites. Musculoskeletal: No worrisome lytic or sclerotic osseous abnormality.  IMPRESSION: 1. Overall generalized appearance of mild disease progression. 2. Index right-sided adnexal mass has progressed since the prior with interval decrease in size of the cystic and solid left adnexal lesion. 3. Interval progression of index capsular implants along the medial right liver. 4. Interval progression of soft tissue nodule in the inferior right paracolic gutter and peritoneal implants in the lower left abdomen and pelvis. 5. No evidence for ascites. 6. Nonobstructing left renal stones. 7. Tiny hiatal hernia. 8. Left colonic diverticulosis without diverticulitis. 9.  Aortic Atherosclerosis (ICD10-I70.0). Electronically Signed   By: Kennith Center M.D.   On: 09/06/2022 07:57      09/15/2022 -  Chemotherapy   Patient is on Treatment Plan : OVARIAN Paclitaxel (175) q21d X 6 Cycles     11/16/2022 Imaging   CT ABDOMEN PELVIS W CONTRAST  Result Date: 11/16/2022 CLINICAL DATA:  Ovarian carcinoma. Assess treatment response. * Tracking Code: BO * EXAM: CT ABDOMEN AND PELVIS WITH CONTRAST TECHNIQUE: Multidetector CT imaging of the abdomen and pelvis was performed using the standard protocol following bolus administration of intravenous contrast. RADIATION DOSE REDUCTION: This exam was performed according to the departmental dose-optimization program which includes automated exposure control, adjustment of the mA and/or kV according to patient size and/or use of iterative reconstruction technique. CONTRAST:  OMNIPAQUE IOHEXOL 300 MG/ML  SOLN COMPARISON:  None Available. FINDINGS: Lower chest: Lung bases are clear. Hepatobiliary: No focal hepatic lesion. Normal gallbladder. No biliary duct dilatation. Common bile duct is normal. Peritoneal implant described along the margin the RIGHT hepatic liver is much less conspicuous. Capsular lesion measuring 12 mm on image 14/2 is decreased from 26 mm. Pancreas: Pancreas is normal. No ductal dilatation. No pancreatic inflammation. Spleen: Normal spleen  Adrenals/urinary tract: Adrenal glands normal. Multiple calculi within LEFT kidney without obstruction. Ureters and bladder normal. Stomach/Bowel: Stomach, small bowel, appendix, and cecum are normal. Multiple diverticula of the descending colon and sigmoid colon without acute inflammation. Vascular/Lymphatic: Abdominal aorta is normal caliber. No periportal or retroperitoneal adenopathy. No pelvic adenopathy. Reproductive: Solid enhancing mass in the deep RIGHT pelvis adjacent the rectum measures 4.0 x 4.3 cm compared to 4.6 x 4.9 cm. Small adjacent satellite nodule measuring 1.1 cm (image 63) is unchanged. Cystic lesion of the LEFT adnexa with mural nodularity measures 3.0 x 2.4 cm compared to 3.2 x 2.9 cm. No new nodularity in the pelvis. Post hysterectomy. Other: Interval decrease in the peritoneal nodularity. For example peritoneal nodule adjacent to the cecum in the RIGHT iliac fossa measures 6 mm (image 43/2 compared to 10 mm on prior. Lesions on the LEFT iliac vessels less prominent. For example 7 mm lesion on 50/2 decreased from 16 mm. Musculoskeletal: No aggressive osseous lesion. IMPRESSION: 1. Interval decrease in size of RIGHT pelvic mass. 2. Interval decrease in size of LEFT adnexal cystic mass. 3. Interval decrease in size of peritoneal nodularity. 4. Interval decrease in size of perihepatic peritoneal implant. 5. No new metastatic disease. Electronically Signed   By: Genevive Bi M.D.   On: 11/16/2022 14:34      02/08/2023 Imaging   CT ABDOMEN PELVIS W CONTRAST  Result Date: 02/08/2023 CLINICAL DATA:  Ovarian carcinoma.  Assess treatment response. EXAM: CT ABDOMEN AND PELVIS WITH CONTRAST TECHNIQUE: Multidetector CT imaging of the abdomen and pelvis was performed using the standard protocol following bolus administration of intravenous contrast. RADIATION DOSE REDUCTION: This exam was performed according to the departmental dose-optimization program which includes automated exposure control,  adjustment of the mA and/or kV according to patient size and/or use of iterative reconstruction technique. CONTRAST:  OMNIPAQUE IOHEXOL 300 MG/ML  SOLN COMPARISON:  CT 11/14/2022 FINDINGS: Lower chest: Lung bases are clear. Hepatobiliary: No focal hepatic lesion. Normal gallbladder. No biliary duct dilatation. Common bile duct is normal. Small peritoneal implant along the RIGHT hepatic lobe is less conspicuous at 6 mm (image 18/2) compared to 11 mm on prior. Pancreas: Pancreas is normal. No ductal dilatation. No pancreatic inflammation. Spleen: Normal spleen Adrenals/urinary tract: Adrenal glands normal. Multiple nonobstructing calculi in the upper pole of the LEFT kidney. Ureters and bladder normal. Stomach/Bowel: Stomach, small bowel, appendix, and cecum are normal. The colon and rectosigmoid colon are normal. Vascular/Lymphatic: Abdominal aorta is normal caliber with atherosclerotic calcification. There is no retroperitoneal or periportal lymphadenopathy. No pelvic lymphadenopathy. Reproductive: Post hysterectomy anatomy. Again demonstrated rounded mass posterior to the vaginal cuff just RIGHT of midline measures 3.7 by 3.0 cm (image 69/series 2) compared to 4.3 x 4.0 cm on most recent CT. Part cystic and solid lesion in the LEFT adnexa measures 2.2 x 1.6 cm (image 65) compared to 3.0 x 2.2 cm. Previously measured peritoneal implant in the deep RIGHT pelvis is no longer identified. No new mass lesions within the pelvis. Other: No new peritoneal disease.  No free fluid. Musculoskeletal: No aggressive osseous lesion. IMPRESSION: 1. Interval decrease in size rounded mass in the deep RIGHT pelvis. 2. Interval decrease in size of cystic LEFT adnexal lesion. 3. Decrease in size of peritoneal implants. 4. No evidence of new metastatic disease. No intraperitoneal free fluid Electronically Signed   By: Genevive Bi M.D.   On: 02/08/2023 10:56          Interval history-: Stacy Rivas is a 67 y.o.  female with oncologic history as above presenting to [facility] today with chief complaint of      ROS  All other systems are reviewed and are negative for acute change except as noted in the HPI.    Allergies  Allergen Reactions   Carboplatin Other (See Comments)    Chest tightness/burning, flushing.  See Progress Note from 12/29/2022.   Skin Adhesives [Cyanoacrylate] Hives   Shrimp [Shellfish Allergy] Itching   Sulfasalazine Nausea And Vomiting   Sulfonamide Derivatives Nausea Only   Telithromycin     Other Reaction(s): severe dizziness     Past Medical History:  Diagnosis Date   Anxiety state, unspecified    Diverticulosis    Esophageal reflux    Family history of breast cancer    Family history of lymphoma    Family history of stomach cancer    Family history of uterine cancer    Fibroid tumor    Headache    due to allergies    Hiatal hernia    Hyperlipidemia    IBS (irritable bowel syndrome)    Internal hemorrhoids    Nonspecific elevation of levels of transaminase or lactic acid dehydrogenase (LDH)    ovarian ca dx'd 11/2018   Renal stone 07/2013   Tubulovillous adenoma of colon    Varicose veins    superficail thrombophlebitis (left LE)     Past Surgical History:  Procedure Laterality  Date   CATARACT EXTRACTION, BILATERAL     cystoscopy with instillation for cystitis  2001   DEBULKING N/A 02/12/2019   Procedure: DEBULKING OF TUMOR;  Surgeon: Adolphus Birchwood, MD;  Location: [facility];  Service: Gynecology;  Laterality: N/A;   DILATION AND CURETTAGE OF UTERUS     miscarriage   HYSTERECTOMY ABDOMINAL WITH SALPINGO-OOPHORECTOMY Bilateral 02/12/2019   Procedure: HYSTERECTOMY ABDOMINAL WITH SALPINGO-OOPHORECTOMY;  Surgeon: Adolphus Birchwood, MD;  Location: [facility];  Service: Gynecology;  Laterality: Bilateral;   IR IMAGING GUIDED PORT INSERTION  12/06/2018   IR IMAGING GUIDED PORT INSERTION  10/10/2019   IR PARACENTESIS  12/03/2018   IR PARACENTESIS  12/14/2018   IR REMOVAL TUN  ACCESS W/ PORT W/O FL MOD SED  06/03/2019   LAPAROSCOPY     adhesions   LITHOTRIPSY  07/2013   x 2   MYOMECTOMY  1992   OMENTECTOMY N/A 02/12/2019   Procedure: OMENTECTOMY;  Surgeon: Adolphus Birchwood, MD;  Location: [facility];  Service: Gynecology;  Laterality: N/A;   PARACENTESIS  11/21/2018   abdominal , removed 3.1 Liters     Social History   Socioeconomic History   Marital status: Married    Spouse name: Not on file   Number of children: 0   Years of education: Not on file   Highest education level: Not on file  Occupational History   Occupation: sales  Tobacco Use   Smoking status: Former    Current packs/day: 0.00    Types: Cigarettes    Start date: 05/23/1970    Quit date: 05/23/1980    Years since quitting: 42.8   Smokeless tobacco: Never   Tobacco comments:    quit 1984  Vaping Use   Vaping status: Never Used  Substance and Sexual Activity   Alcohol use: Yes    Alcohol/week: 0.0 standard drinks of alcohol    Comment: 2/day, 11-21-2018   no alcodholint he last month    Drug use: No   Sexual activity: Yes    Partners: Male  Other Topics Concern   Not on file  Social History Narrative   Not on file   Social Determinants of Health   Financial Resource Strain: Not on file  Food Insecurity: Not on file  Transportation Needs: Not on file  Physical Activity: Not on file  Stress: Not on file  Social Connections: Not on file  Intimate Partner Violence: Not on file    Family History  Problem Relation Age of Onset   Lymphoma Father        dx 33s   Heart disease Father    Hypertension Father    Hypertension Mother    Hyperlipidemia Mother    Breast cancer Mother 32   Diabetes Maternal Grandmother    Uterine cancer Maternal Grandmother        dx 84s   Stomach cancer Paternal Grandfather    Breast cancer Other        grandmother's sisters, both dx 48s   Colon cancer Neg Hx      Current Outpatient Medications:    ALPRAZolam (XANAX) 0.25 MG tablet, Take 1 tablet by  mouth at bedtime as needed for anxiety., Disp: 30 tablet, Rfl: 0   Calcium Citrate (CAL-CITRATE PO), 1 poq, Disp: , Rfl:    Calcium-Magnesium-Vitamin D 200-100-33.3 MG-MG-UNIT CAPS, take one by mouth as needed Oral, Disp: , Rfl:    carboxymethylcellulose (REFRESH PLUS) 0.5 % SOLN, Place 1 drop into both eyes 2 (two) times daily  as needed (dry eyes)., Disp: , Rfl:    Cholecalciferol (VITAMIN D3) 25 MCG (1000 UT) CAPS, Take 1,000 Units by mouth daily. , Disp: , Rfl:    dexamethasone (DECADRON) 4 MG tablet, Take 2 tablets by mouth the night before and 2 tablets the morning of chemotherapy, every 3 weeks, by mouth x 6 cycles, Disp: 24 tablet, Rfl: 0   diphenhydrAMINE (BENADRYL ALLERGY) 25 MG tablet, Take by mouth., Disp: , Rfl:    escitalopram (LEXAPRO) 10 MG tablet, 1 po qd Oral, Disp: , Rfl:    esomeprazole (NEXIUM) 20 MG capsule, Take 20 mg by mouth daily. , Disp: , Rfl:    estradiol (ESTRACE VAGINAL) 0.1 MG/GM vaginal cream, Place 1 applicatorful vaginally 3 (three) times a week., Disp: 42.5 g, Rfl: 12   furosemide (LASIX) 20 MG tablet, 1 tablet Orally Once a day, Disp: , Rfl:    hydrALAZINE (APRESOLINE) 10 MG tablet, 1 tablet with food Orally two times a day, Disp: , Rfl:    lidocaine-prilocaine (EMLA) cream, Apply topically as needed., Disp: 30 g, Rfl: 0   lisinopril (ZESTRIL) 20 MG tablet, Take 1 tablet (20 mg total) by mouth daily., Disp: 90 tablet, Rfl: 1   loratadine (CLARITIN) 10 MG tablet, Take 10 mg by mouth daily as needed for allergies., Disp: , Rfl:    Multiple Vitamins-Minerals (MULTI FOR HER PO), 1 poq, Disp: , Rfl:    mupirocin ointment (BACTROBAN) 2 %, 1 application Externally Twice a day, Disp: , Rfl:    ondansetron (ZOFRAN) 8 MG tablet, Take 1 tablet by mouth 2 times daily as needed. Start on the third day after chemotherapy., Disp: 30 tablet, Rfl: 1   oxyCODONE-acetaminophen (PERCOCET/ROXICET) 5-325 MG tablet, , Disp: , Rfl:    pantoprazole (PROTONIX) 40 MG tablet, 1 po qd on an  empty stomach Oral, Disp: , Rfl:    prochlorperazine (COMPAZINE) 10 MG tablet, Take 1 tablet by mouth every 6 hours as needed (Nausea or vomiting)., Disp: 30 tablet, Rfl: 1  PHYSICAL EXAM: ECOG FS:{CHL ONC [id number]}   There were no vitals filed for this visit. Physical Exam     LABORATORY DATA: I have reviewed the data as listed    Latest Ref Rng & Units 03/02/2023   11:50 AM 02/09/2023    7:49 AM 01/19/2023    7:56 AM  CBC  WBC 4.0 - 10.5 K/uL 8.3  7.1  4.8   Hemoglobin 12.0 - 15.0 g/dL 81.1  91.4  78.2   Hematocrit 36.0 - 46.0 % 37.4  36.0  33.1   Platelets 150 - 400 K/uL 213  209  198         Latest Ref Rng & Units 03/02/2023   11:50 AM 02/09/2023    7:49 AM 01/19/2023    7:56 AM  CMP  Glucose 70 - 99 mg/dL 956  213  086   BUN 8 - 23 mg/dL 13  14  13    Creatinine 0.44 - 1.00 mg/dL 5.78  4.69  6.29   Sodium 135 - 145 mmol/L 140  142  140   Potassium 3.5 - 5.1 mmol/L 3.8  3.6  3.5   Chloride 98 - 111 mmol/L 105  107  107   CO2 22 - 32 mmol/L 26  26  26    Calcium 8.9 - 10.3 mg/dL 52.8  9.5  9.2   Total Protein 6.5 - 8.1 g/dL 7.0  6.8  6.7   Total Bilirubin 0.3 - 1.2 mg/dL 0.8  0.9  0.8   Alkaline Phos 38 - 126 U/L 94  136  107   AST 15 - 41 U/L 32  64  34   ALT 0 - 44 U/L 35  78  35        RADIOGRAPHIC STUDIES (from last 24 hours if applicable) I have personally reviewed the radiological images as listed and agreed with the findings in the report. No results found.      Visit Diagnosis: No diagnosis found.   No orders of the defined types were placed in this encounter.   All questions were answered. The patient knows to call the clinic with any problems, questions or concerns. No barriers to learning was detected.  A total of more than *** minutes were spent on this encounter with face-to-face time and non-face-to-face time, including preparing to see the patient, ordering tests and/or medications, counseling the patient and coordination of care as  outlined above.    Thank you for allowing me to participate in the care of this patient.    Shanon Ace, PA-C Department of Hematology/Oncology [facility] Phone: [phone]  [phone]    03/22/2023 12:24 PM

## 2023-03-22 NOTE — Progress Notes (Signed)
Symptom Management Consult Note [facility]    Patient Care Team: Geoffry Paradise, MD as PCP - General (Internal Medicine) Leanora Ivanoff, RN as Registered Nurse    Name / MRN / DOB: Stacy Rivas  [id number]  05/06/1956   Date of visit: 03/22/2023   Chief Complaint/Reason for visit: cough   Current Therapy: Taxol  Last treatment:  Day 1   Cycle 9 on 03/02/23   ASSESSMENT & PLAN: Patient is a 67 y.o. 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 [name].  I have viewed most recent oncology note and lab work.    #Left ovarian epithelial cancer  - Next appointment with oncologist is 03/23/23 - Labs checked today including CBC and CMP as they were scheduled to be drawn tomorrow for treatment. -CBC showing stable anemia. CMP with mild elevation of LFTs, similar to labs in the last month. LFTs will continue to be trended at future appointments. It is reassuring t bili is normal. Labs are within parameters for treatment.    #URI symptoms -New onset of sinus drainage, headache, sore throat, and cough. No fever. PE with clear lungs. -Chest xray showing no acute infectious processes.I viewed image and agree with radiologist impression. Covid test is positive, will prescribe Paxlovid. Also encouraged patient to continue OTC symptom management. -As patient is covid positive treatment tomorrow will be held. Oncologist will be notified.  Strict ED precautions discussed should symptoms worsen.   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 14.7 cm poorly defined soft tissue mass in central pelvis suspicious for primary ovarian carcinoma, with uterine leiomyosarcoma considered a less likely differential diagnosis. 2.  Moderate ascites and diffuse peritoneal carcinomatosis. 3. Several small uterine fibroids.   11/13/2018 Tumor Marker   Patient's tumor was tested for the following markers: CA-125 Results of the tumor marker test revealed 1434   11/21/2018 Procedure   Successful ultrasound-guided diagnostic and therapeutic paracentesis yielding 3.1 liters of peritoneal fluid   11/21/2018 Pathology Results   PERITONEAL/ASCITIC FLUID(SPECIMEN 1 OF 1 COLLECTED 11/21/18): ADENOCARCINOMA. Specimen Clinical Information Pelvic mass suspicious for ovarian cancer Source Peritoneal/Ascitic Fluid, (specimen 1 of 1 collected 11/21/18) Gross Specimen: Received is/are 1000 ccs of dark amber fluid. (CM:cm) Prepared: # Smears: 0 # Concentration Technique Slides (i.e. 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   11/29/2018 Initial Diagnosis   Ovarian cancer (HCC)   11/29/2018 Cancer Staging   Staging form: Ovary, Fallopian Tube, and Primary Peritoneal Carcinoma, AJCC 8th Edition - Clinical: cT3, cN0, cM0 - Signed by Artis Delay, MD on 11/29/2018   12/03/2018 Procedure   Successful ultrasound-guided therapeutic paracentesis yielding 3.7 liters of peritoneal fluid.     12/06/2018 Procedure   Placement of a subcutaneous port device. Catheter tip at the SVC and right atrium junction   12/14/2018 Procedure   Successful ultrasound-guided paracentesis yielding 2.9 L of peritoneal fluid   12/28/2018 Tumor Marker   Patient's tumor was tested for the following markers: CA-125 Results of the tumor marker test revealed 812.   12/28/2018 - 04/22/2019 Chemotherapy   The patient had carboplatin and taxol for neoadjuvant treatment, followed by interval debulking surgery and subsequent adjuvant chemotherapy treatment.  02/01/2019 Imaging   Ct abdomen and pelvis 8.6 cm left ovarian mass, corresponding to the patient's known primary neoplasm,  improved.   Mild peritoneal nodularity/omental caking, improved.   Small abdominopelvic ascites, improved.     02/01/2019 Tumor Marker   Patient's tumor was tested for the following markers: CA-125 Results of the tumor marker test revealed 183.   02/12/2019 Surgery   Preoperative Diagnosis: Stage IIIC ovarian cancer, s/p neoadjuvant chemotherapy      Procedure(s) Performed: 1. Exploratory laparotomy with total abdominal hysterectomy, bilateral salpingo-oophorectomy, omentectomy radical tumor debulking for ovarian cancer.   Surgeon: Luisa Dago, MD.    Operative Findings: upper abdomen free of disease. No visible omental disease. Small volume (200cc) ascites. 8cm friable mass replacing left ovary and adherent to the sigmoid colon mesentery and ureter on the left.  Anterior fibroid. This represented an optimal cytoreduction (R0) with no gross visible disease remaining.    02/12/2019 Pathology Results   A. OVARY AND FALLOPIAN TUBE, LEFT, SALPINGO-OOPHORECTOMY: - Serous carcinoma, high grade, status post neoadjuvant therapy - See oncology table and comment below B. UTERUS CERVIX WITH RIGHT FALLOPIAN TUBE AND OVARY, HYSTERECTOMY: Uterus: - Serosal surface involved by serous carcinoma - Endomyometrium uninvolved by carcinoma - Benign endometrial polyp (4.1 cm) - Leiomyomata (5.5 cm; largest) - Adenomyosis Cervix: - Uninvolved by carcinoma Left ovary: - Serous carcinoma, high grade Left fallopian tube: - Serous carcinoma, high grade C. SOFT TISSUE, LEFT PELVIC SIDEWALL TUMOR, EXCISION: - Metastatic serous carcinoma, high-grade D. SOFT TISSUE, SIGMOID COLON MESENTERY, EXCISION: - Metastatic serous carcinoma, high-grade E. OMENTUM, TUMOR RESECTION: - Metastatic serous carcinoma, high-grade OVARY or FALLOPIAN TUBE or PRIMARY PERITONEUM: Procedure: Total hysterectomy and bilateral salpingo-oophorectomy, omentectomy and peritoneal biopsies Specimen Integrity: Fragmented Tumor Site:  Left ovary and fallopian tube Ovarian Surface Involvement: Present Fallopian Tube Surface Involvement: Present Tumor Size: 6.3 cm (see comment) Histologic Type: Serous carcinoma Histologic Grade: High-grade Implants: Not applicable Other Tissue/ Organ Involvement: Right ovary, right fallopian tube, omentum, mesentery Largest Extrapelvic Peritoneal Focus: Macroscopic Peritoneal/Ascitic Fluid: Malignant Treatment Effect: No definite or minimal response identified (chemotherapy response score 1 [CRS1] Regional Lymph Nodes: No lymph nodes submitted or found Pathologic Stage Classification (pTNM, AJCC 8th Edition): pT3c, pNX Representative Tumor Block: A4 Comment(s): Additional testing (HER-2, MMR and MSI) are pending. The primary tumor site appears to be the left ovary and fallopian tube. The uterus is only involved on the serosal surface. There is tumor on the anterior peritoneal reflection.  Addendum: Tumor is Her2 negative,MSI stable   02/19/2019 Tumor Marker   Patient's tumor was tested for the following markers: CA-125 Results of the tumor marker test revealed 40.3   04/01/2019 Tumor Marker   Patient's tumor was tested for the following markers: CA-125 Results of the tumor marker test revealed 8.1    Genetic Testing   Negative testing. No pathogenic variants identified on the [employer]. The report date is 04/17/2019.  Somatic genes analyzed through [facility]: ATM, BARD1, BRCA1, BRCA2, BRIP1, CHEK2, MRE11A, NBN, PALB2, RAD51C, RAD51D.  The [facility] gene panel offered by [employer] includes sequencing and rearrangement analysis for the following 36 genes: APC*, ATM*, AXIN2, BARD1, BMPR1A, BRCA1*, BRCA2*, BRIP1*, CDH1*, CDK4, CDKN2A, CHEK2*, DICER1, MLH1*, MSH2*, MSH3, MSH6*, MUTYH*, NBN, NF1*, NTHL1, PALB2*, PMS2*, PTEN*, RAD51C*, RAD51D*, RECQL, SMAD4, SMARCA4, STK11 and TP53* (sequencing and deletion/duplication); HOXB13, POLD1 and POLE (sequencing  only); EPCAM and GREM1 (deletion/duplication only).    05/21/2019 Imaging   1. Interval hysterectomy, oophorectomy and omentectomy. No evidence  of residual ovarian carcinoma in the pelvis. No evidence of peritoneal disease. No intraperitoneal free fluid. 2. Nonobstructing LEFT renal calculi.  Normal ureters.   05/22/2019 Tumor Marker   Patient's tumor was tested for the following markers: CA-125 Results of the tumor marker test revealed 5.7.   06/03/2019 Procedure   Successful right IJ vein Port-A-Cath explant.   08/26/2019 Tumor Marker   Patient's tumor was tested for the following markers: CA-125. Results of the tumor marker test revealed 4.9   10/03/2019 Imaging   1. New tumor deposits along the capsular surfaces of the liver, spleen, in the left pelvis, and right paracolic gutter, compatible with metastatic disease. 2. Mild dilation of the dorsal pancreatic duct in the pancreatic body and head, cause uncertain. 3. Nonobstructive left nephrolithiasis. 4. Aortic atherosclerosis.   Aortic Atherosclerosis (ICD10-I70.0   10/10/2019 Procedure   Successful placement of a right IJ approach Power Port with ultrasound and fluoroscopic guidance. The catheter is ready for use.     10/11/2019 Tumor Marker   Patient's tumor was tested for the following markers: CA-125 Results of the tumor marker test revealed 19.6.   10/14/2019 -  Chemotherapy   The patient had carboplatin and gemzar for chemotherapy treatment.     11/04/2019 Tumor Marker   Patient's tumor was tested for the following markers: CA-125 Results of the tumor marker test revealed 11.4   11/28/2019 Tumor Marker   Patient's tumor was tested for the following markers: CA-125 Results of the tumor marker test revealed 5.8   12/20/2019 Imaging   1. Significant interval reduction in mixed solid and cystic nodule in the vicinity of the left ovary, with significant improvement or resolution of multiple pelvic, peritoneal, and organ  capsule implants. Resolution of previously seen small volume ascites. Findings are consistent with treatment response of abdominal metastatic disease. 2. Status post hysterectomy, oophorectomy, and omentectomy. 3. Nonobstructive left nephrolithiasis. 4. Aortic Atherosclerosis (ICD10-I70.0).   12/23/2019 Tumor Marker   Patient's tumor was tested for the following markers: CA-125 Results of the tumor marker test revealed 5.0   01/24/2020 Tumor Marker   Patient's tumor was tested for the following markers: CA-125 Results of the tumor marker test revealed 4.8   02/24/2020 Tumor Marker   Patient's tumor was tested for the following markers: CA-125. Results of the tumor marker test revealed 4.3.   03/06/2020 Imaging   1. Status post hysterectomy, bilateral oophorectomy, and omentectomy. 2. Resolution of previously described left adnexal nodularity. 3. No residual disease identified. 4.  Aortic Atherosclerosis (ICD10-I70.0). 5. Left nephrolithiasis.   04/20/2020 Tumor Marker   Patient's tumor was tested for the following markers: CA-125 Results of the tumor marker test revealed 4.6   05/18/2020 Tumor Marker   Patient's tumor was tested for the following markers: CA-125 Results of the tumor marker test revealed 4.7   05/29/2020 Imaging   Status post hysterectomy and bilateral salpingo oophorectomy.   No evidence of recurrent or metastatic disease.   06/08/2020 - 09/13/2021 Chemotherapy   She was on niraparib       06/17/2020 Tumor Marker   Patient's tumor was tested for the following markers: CA-125 Results of the tumor marker test revealed 4.5   08/04/2020 Tumor Marker   Patient's tumor was tested for the following markers: CA-125. Results of the tumor marker test revealed 5.7   09/07/2020 Imaging   1. Status post hysterectomy and bilateral salpingo oophorectomy. No evidence of recurrent or metastatic disease. 2. Nonobstructing left nephrolithiasis. 3. Left-sided colonic  diverticulosis without findings of acute diverticulitis. 4. Aortic atherosclerosis.     09/07/2020 Tumor Marker   Patient's tumor was tested for the following markers: CA-125 Results of the tumor marker test revealed 4.8.   10/21/2020 Tumor Marker   Patient's tumor was tested for the following markers: CA-125. Results of the tumor marker test revealed 5.1.   12/14/2020 Tumor Marker   Patient's tumor was tested for the following markers: CA-125. Results of the tumor marker test revealed 5.5.   04/05/2021 Tumor Marker   Patient's tumor was tested for the following markers: CA-125. Results of the tumor marker test revealed 5.9.   05/31/2021 Tumor Marker   Patient's tumor was tested for the following markers: CA-125. Results of the tumor marker test revealed 8.1.   09/13/2021 Imaging   1. Interval development of multiple new cystic and solid lesions in the abdomen and pelvis measuring up to 10.3 x 7.2 cm and consistent with peritoneal metastatic disease. 2. Nonobstructing left renal stones. 3. Aortic Atherosclerosis (ICD10-I70.0).     09/14/2021 Tumor Marker   Patient's tumor was tested for the following markers: CA-125. Results of the tumor marker test revealed 28.5.   09/16/2021 Echocardiogram   1. Left ventricular ejection fraction, by estimation, is 60 to 65%. The left ventricle has normal function. The left ventricle has no regional wall motion abnormalities. Left ventricular diastolic parameters are consistent with Grade I diastolic dysfunction (impaired relaxation).  2. Right ventricular systolic function is normal. The right ventricular size is normal.  3. The mitral valve is normal in structure. Trivial mitral valve regurgitation. No evidence of mitral stenosis.  4. The aortic valve has an indeterminant number of cusps. Aortic valve regurgitation is trivial. No aortic stenosis is present.  5. The inferior vena cava is normal in size with greater than 50% respiratory variability,  suggesting right atrial pressure of 3 mmHg.     09/20/2021 - 01/17/2022 Chemotherapy   Patient is on Treatment Plan : OVARIAN RECURRENT Liposomal Doxorubicin + Carboplatin q28d X 6 Cycles     09/20/2021 - 02/22/2022 Chemotherapy   Patient is on Treatment Plan : OVARIAN RECURRENT Liposomal Doxorubicin + Carboplatin q28d X 6 Cycles     09/23/2021 Tumor Marker   Patient's tumor was tested for the following markers: CA-125. Results of the tumor marker test revealed 27.7.   11/24/2021 Tumor Marker   Patient's tumor was tested for the following markers: CA-125. Results of the tumor marker test revealed 28.4.   12/13/2021 Imaging   1. Overall stable peritoneal carcinomatosis. No new disease is demonstrated. 2. Moderate left-sided hydroureteronephrosis likely due to compression of the left ureter by the left pelvic mass. 3. Stable left-sided renal calculi. 4. Numerous left renal calculi.     12/22/2021 Tumor Marker   Patient's tumor was tested for the following markers: CA-125. Results of the tumor marker test revealed 24.7.   01/18/2022 Tumor Marker   Patient's tumor was tested for the following markers: CA-125. Results of the tumor marker test revealed 9.9.   01/27/2022 Echocardiogram    1. Left ventricular ejection fraction, by estimation, is 60 to 65%. The left ventricle has normal function. The left ventricle has no regional wall motion abnormalities. Left ventricular diastolic parameters are consistent with Grade I diastolic dysfunction (impaired relaxation). The average left ventricular global longitudinal strain is -21.1 %. The global longitudinal strain is normal.  2. Right ventricular systolic function is normal. The right ventricular size is normal.  3. The mitral valve is abnormal.  Mild mitral valve regurgitation.  4. The aortic valve is tricuspid. Aortic valve regurgitation is trivial.  5. The inferior vena cava is normal in size with greater than 50% respiratory variability, suggesting  right atrial pressure of 3 mmHg.   02/23/2022 Tumor Marker   Patient's tumor was tested for the following markers: CA-125. Results of the tumor marker test revealed 8.5.   03/18/2022 Imaging   1. Diminished size of nodular and mixed solid and cystic peritoneal implants in the hepatorenal fossa, right lower quadrant, and left pelvis. 2. Unchanged solid mass in the right pelvis measuring 4.0 x 3.7 cm. 3. Constellation of findings is overall consistent with treatment response 4. Status post hysterectomy, oophorectomy, and omentectomy. 5. Nonobstructive left nephrolithiasis.   Aortic Atherosclerosis (ICD10-I70.0).     03/22/2022 - 08/16/2022 Chemotherapy   Patient is on Treatment Plan : Ovarian Bevacizumab q21d     03/24/2022 Tumor Marker   Patient's tumor was tested for the following markers: CA-125. Results of the tumor marker test revealed 7.1.   04/28/2022 Tumor Marker   Patient's tumor was tested for the following markers: CA-125. Results of the tumor marker test revealed 7.0.   05/27/2022 Tumor Marker   Patient's tumor was tested for the following markers: CA-125. Results of the tumor marker test revealed 7.7.   06/14/2022 Imaging   1. Slight response to therapy as evidenced by decrease in size of a cystic and solid mass in the left adnexa as well as peritoneal implants in the right paracolic gutter. Solid right adnexal mass and additional peritoneal implants along right hepatic lobe and in the left anatomic pelvis appear stable. 2. Hepatic steatosis. 3. Left renal stones. 4.  Aortic atherosclerosis (ICD10-I70.0).   06/17/2022 Tumor Marker   Patient's tumor was tested for the following markers: CA-125. Results of the tumor marker test revealed 11.2.   09/05/2022 Imaging   CT ABDOMEN PELVIS W CONTRAST  Result Date: 09/06/2022 CLINICAL DATA:  Ovarian cancer.  Restaging.  * Tracking Code: BO * EXAM: CT ABDOMEN AND PELVIS WITH CONTRAST TECHNIQUE: Multidetector CT imaging of the  abdomen and pelvis was performed using the standard protocol following bolus administration of intravenous contrast. RADIATION DOSE REDUCTION: This exam was performed according to the departmental dose-optimization program which includes automated exposure control, adjustment of the mA and/or kV according to patient size and/or use of iterative reconstruction technique. CONTRAST:  OMNIPAQUE IOHEXOL 300 MG/ML  SOLN COMPARISON:  06/13/2022 FINDINGS: Lower chest: Unremarkable. Hepatobiliary: No suspicious focal abnormality within the liver parenchyma. There is no evidence for gallstones, gallbladder wall thickening, or pericholecystic fluid. No intrahepatic or extrahepatic biliary dilation. Pancreas: No pancreatic mass lesion evident. Diffuse prominence of the main pancreatic duct is stable measuring up to 3-4 mm diameter in the head of the pancreas. Spleen: No splenomegaly. No focal mass lesion. Adrenals/Urinary Tract: No adrenal nodule or mass. Right kidney unremarkable. A cluster of nonobstructing stones in the upper pole right kidney is again noted with dominant stone measuring about 6 mm in size. No evidence for hydroureter. The urinary bladder appears normal for the degree of distention. Stomach/Bowel: Tiny hiatal hernia. Stomach otherwise unremarkable. Duodenum is normally positioned as is the ligament of Treitz. No small bowel wall thickening. No small bowel dilatation. The terminal ileum is normal. The appendix is normal. No gross colonic mass. No colonic wall thickening. Diverticular changes are noted in the left colon without evidence of diverticulitis. Vascular/Lymphatic: There is mild atherosclerotic calcification of the abdominal aorta without aneurysm. There  is no gastrohepatic or hepatoduodenal ligament lymphadenopathy. No retroperitoneal or mesenteric lymphadenopathy. No pelvic sidewall lymphadenopathy. Reproductive: Right-sided adnexal mass measured previously at 4.2 x 3.8 cm is now 4.9 x 4.6  cm. Cystic and solid left adnexal lesion measured previously at 4.1 x 3.4 cm is now 3.2 x 2.9 cm. 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 The more anterior of the 2 index lesions measures 2.6 x 2.1 cm today compared to 2.4 x 1.6 cm previously. The more posterior lesion is 2.4 x 1.4 cm today compared to 2.0 x 1.9 cm previously. 5 mm soft tissue nodule identified in the inferior paracolic gutter previously at 5 mm measures 10 mm today on image 51/2. Implants along the left posterior peritoneum adjacent to the psoas muscle and iliac vessels appear progressive. 1.6 x 1.3 cm lesion visible on 58/2 was 1.1 x 0.8 cm previously (remeasured). No evidence for ascites. Musculoskeletal: No worrisome lytic or sclerotic osseous abnormality. IMPRESSION: 1. Overall generalized appearance of mild disease progression. 2. Index right-sided adnexal mass has progressed since the prior with interval decrease in size of the cystic and solid left adnexal lesion. 3. Interval progression of index capsular implants along the medial right liver. 4. Interval progression of soft tissue nodule in the inferior right paracolic gutter and peritoneal implants in the lower left abdomen and pelvis. 5. No evidence for ascites. 6. Nonobstructing left renal stones. 7. Tiny hiatal hernia. 8. Left colonic diverticulosis without diverticulitis. 9.  Aortic Atherosclerosis (ICD10-I70.0). Electronically Signed   By: Kennith Center M.D.   On: 09/06/2022 07:57      09/15/2022 -  Chemotherapy   Patient is on Treatment Plan : OVARIAN Paclitaxel (175) q21d X 6 Cycles     11/16/2022 Imaging   CT ABDOMEN PELVIS W CONTRAST  Result Date: 11/16/2022 CLINICAL DATA:  Ovarian carcinoma. Assess treatment response. * Tracking Code: BO * EXAM: CT ABDOMEN AND PELVIS WITH CONTRAST TECHNIQUE: Multidetector CT imaging of the abdomen and pelvis was performed using the standard protocol following bolus administration of intravenous contrast.  RADIATION DOSE REDUCTION: This exam was performed according to the departmental dose-optimization program which includes automated exposure control, adjustment of the mA and/or kV according to patient size and/or use of iterative reconstruction technique. CONTRAST:  OMNIPAQUE IOHEXOL 300 MG/ML  SOLN COMPARISON:  None Available. FINDINGS: Lower chest: Lung bases are clear. Hepatobiliary: No focal hepatic lesion. Normal gallbladder. No biliary duct dilatation. Common bile duct is normal. Peritoneal implant described along the margin the RIGHT hepatic liver is much less conspicuous. Capsular lesion measuring 12 mm on image 14/2 is decreased from 26 mm. Pancreas: Pancreas is normal. No ductal dilatation. No pancreatic inflammation. Spleen: Normal spleen Adrenals/urinary tract: Adrenal glands normal. Multiple calculi within LEFT kidney without obstruction. Ureters and bladder normal. Stomach/Bowel: Stomach, small bowel, appendix, and cecum are normal. Multiple diverticula of the descending colon and sigmoid colon without acute inflammation. Vascular/Lymphatic: Abdominal aorta is normal caliber. No periportal or retroperitoneal adenopathy. No pelvic adenopathy. Reproductive: Solid enhancing mass in the deep RIGHT pelvis adjacent the rectum measures 4.0 x 4.3 cm compared to 4.6 x 4.9 cm. Small adjacent satellite nodule measuring 1.1 cm (image 63) is unchanged. Cystic lesion of the LEFT adnexa with mural nodularity measures 3.0 x 2.4 cm compared to 3.2 x 2.9 cm. No new nodularity in the pelvis. Post hysterectomy. Other: Interval decrease in the peritoneal nodularity. For example peritoneal nodule adjacent to the cecum in the RIGHT iliac fossa measures 6  mm (image 43/2 compared to 10 mm on prior. Lesions on the LEFT iliac vessels less prominent. For example 7 mm lesion on 50/2 decreased from 16 mm. Musculoskeletal: No aggressive osseous lesion. IMPRESSION: 1. Interval decrease in size of RIGHT pelvic mass. 2. Interval  decrease in size of LEFT adnexal cystic mass. 3. Interval decrease in size of peritoneal nodularity. 4. Interval decrease in size of perihepatic peritoneal implant. 5. No new metastatic disease. Electronically Signed   By: Genevive Bi M.D.   On: 11/16/2022 14:34      02/08/2023 Imaging   CT ABDOMEN PELVIS W CONTRAST  Result Date: 02/08/2023 CLINICAL DATA:  Ovarian carcinoma.  Assess treatment response. EXAM: CT ABDOMEN AND PELVIS WITH CONTRAST TECHNIQUE: Multidetector CT imaging of the abdomen and pelvis was performed using the standard protocol following bolus administration of intravenous contrast. RADIATION DOSE REDUCTION: This exam was performed according to the departmental dose-optimization program which includes automated exposure control, adjustment of the mA and/or kV according to patient size and/or use of iterative reconstruction technique. CONTRAST:  OMNIPAQUE IOHEXOL 300 MG/ML  SOLN COMPARISON:  CT 11/14/2022 FINDINGS: Lower chest: Lung bases are clear. Hepatobiliary: No focal hepatic lesion. Normal gallbladder. No biliary duct dilatation. Common bile duct is normal. Small peritoneal implant along the RIGHT hepatic lobe is less conspicuous at 6 mm (image 18/2) compared to 11 mm on prior. Pancreas: Pancreas is normal. No ductal dilatation. No pancreatic inflammation. Spleen: Normal spleen Adrenals/urinary tract: Adrenal glands normal. Multiple nonobstructing calculi in the upper pole of the LEFT kidney. Ureters and bladder normal. Stomach/Bowel: Stomach, small bowel, appendix, and cecum are normal. The colon and rectosigmoid colon are normal. Vascular/Lymphatic: Abdominal aorta is normal caliber with atherosclerotic calcification. There is no retroperitoneal or periportal lymphadenopathy. No pelvic lymphadenopathy. Reproductive: Post hysterectomy anatomy. Again demonstrated rounded mass posterior to the vaginal cuff just RIGHT of midline measures 3.7 by 3.0 cm (image 69/series 2) compared  to 4.3 x 4.0 cm on most recent CT. Part cystic and solid lesion in the LEFT adnexa measures 2.2 x 1.6 cm (image 65) compared to 3.0 x 2.2 cm. Previously measured peritoneal implant in the deep RIGHT pelvis is no longer identified. No new mass lesions within the pelvis. Other: No new peritoneal disease.  No free fluid. Musculoskeletal: No aggressive osseous lesion. IMPRESSION: 1. Interval decrease in size rounded mass in the deep RIGHT pelvis. 2. Interval decrease in size of cystic LEFT adnexal lesion. 3. Decrease in size of peritoneal implants. 4. No evidence of new metastatic disease. No intraperitoneal free fluid Electronically Signed   By: Genevive Bi M.D.   On: 02/08/2023 10:56          Interval history-: Discussed the use of AI scribe software for clinical note transcription with the patient, who gave verbal consent to proceed.  Stacy Rivas is a 67 y.o. female with oncologic history as above presenting to [facility] today with chief complaint of cough. She presents unaccompanied to clinic today.  Patient reports nonproductive cough has been present x 2 days. Also endorsing sinus congestion and pressure.She denies fever and tested negative for COVID-19 today with a home test. The patient also endorses a sinus headache, postnasal drip, mild sore throat. She reports cough is alleviated by keeping the throat moist. She has been taking Tylenol Sinus, but report minimal relief. The patient also mentions greenish nasal discharge, which they note is not uncommon when they are ill.  The patient has had a recent exposure to a  church member who tested positive for COVID-19. She denies shortness of breath, chest pain, and productive cough. She has a history of COVID-19 infection in November of the previous year, which was managed with Paxlovid. The patient tolerated the medication well and had a relatively mild course of illness, with the most significant symptom being fatigue.  The patient also reports a  decrease in energy levels, which they attribute to their recent round of chemotherapy. 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 They have been managing this by icing their hands during treatment. The patient denies any body aches but mentions a lack of sleep due to their symptoms.    ROS  All other systems are reviewed and are negative for acute change except as noted in the HPI.    Allergies  Allergen Reactions   Carboplatin Other (See Comments)    Chest tightness/burning, flushing.  See Progress Note from 12/29/2022.   Skin Adhesives [Cyanoacrylate] Hives   Shrimp [Shellfish Allergy] Itching   Sulfasalazine Nausea And Vomiting   Sulfonamide Derivatives Nausea Only   Telithromycin     Other Reaction(s): severe dizziness     Past Medical History:  Diagnosis Date   Anxiety state, unspecified    Diverticulosis    Esophageal reflux    Family history of breast cancer    Family history of lymphoma    Family history of stomach cancer    Family history of uterine cancer    Fibroid tumor    Headache    due to allergies    Hiatal hernia    Hyperlipidemia    IBS (irritable bowel syndrome)    Internal hemorrhoids    Nonspecific elevation of levels of transaminase or lactic acid dehydrogenase (LDH)    ovarian ca dx'd 11/2018   Renal stone 07/2013   Tubulovillous adenoma of colon    Varicose veins    superficail thrombophlebitis (left LE)     Past Surgical History:  Procedure Laterality Date   CATARACT EXTRACTION, BILATERAL     cystoscopy with instillation for cystitis  2001   DEBULKING N/A 02/12/2019   Procedure: DEBULKING OF TUMOR;  Surgeon: Adolphus Birchwood, MD;  Location: [facility];  Service: Gynecology;  Laterality: N/A;   DILATION AND CURETTAGE OF UTERUS     miscarriage   HYSTERECTOMY ABDOMINAL WITH SALPINGO-OOPHORECTOMY Bilateral 02/12/2019   Procedure: HYSTERECTOMY ABDOMINAL WITH SALPINGO-OOPHORECTOMY;  Surgeon:  Adolphus Birchwood, MD;  Location: [facility];  Service: Gynecology;  Laterality: Bilateral;   IR IMAGING GUIDED PORT INSERTION  12/06/2018   IR IMAGING GUIDED PORT INSERTION  10/10/2019   IR PARACENTESIS  12/03/2018   IR PARACENTESIS  12/14/2018   IR REMOVAL TUN ACCESS W/ PORT W/O FL MOD SED  06/03/2019   LAPAROSCOPY     adhesions   LITHOTRIPSY  07/2013   x 2   MYOMECTOMY  1992   OMENTECTOMY N/A 02/12/2019   Procedure: OMENTECTOMY;  Surgeon: Adolphus Birchwood, MD;  Location: [facility];  Service: Gynecology;  Laterality: N/A;   PARACENTESIS  11/21/2018   abdominal , removed 3.1 Liters     Social History   Socioeconomic History   Marital status: Married    Spouse name: Not on file   Number of children: 0   Years of education: Not on file   Highest education level: Not on file  Occupational History   Occupation: sales  Tobacco Use   Smoking status: Former    Current packs/day:  0.00    Types: Cigarettes    Start date: 05/23/1970    Quit date: 05/23/1980    Years since quitting: 42.8   Smokeless tobacco: Never   Tobacco comments:    quit 1984  Vaping Use   Vaping status: Never Used  Substance and Sexual Activity   Alcohol use: Yes    Alcohol/week: 0.0 standard drinks of alcohol    Comment: 2/day, 11-21-2018   no alcodholint he last month    Drug use: No   Sexual activity: Yes    Partners: Male  Other Topics Concern   Not on file  Social History Narrative   Not on file   Social Determinants of Health   Financial Resource Strain: Not on file  Food Insecurity: Not on file  Transportation Needs: Not on file  Physical Activity: Not on file  Stress: Not on file  Social Connections: Not on file  Intimate Partner Violence: Not on file    Family History  Problem Relation Age of Onset   Lymphoma Father        dx 64s   Heart disease Father    Hypertension Father    Hypertension Mother    Hyperlipidemia Mother    Breast cancer Mother 54   Diabetes Maternal Grandmother    Uterine cancer  Maternal Grandmother        dx 29s   Stomach cancer Paternal Grandfather    Breast cancer Other        grandmother's sisters, both dx 58s   Colon cancer Neg Hx      Current Outpatient Medications:    nirmatrelvir/ritonavir (PAXLOVID, 300/100,) 20 x 150 MG & 10 x 100MG  TBPK, Take 3 tablets by mouth 2 (two) times daily for 5 days. Patient GFR is >60. Take nirmatrelvir (150 mg) two tablets twice daily for 5 days and ritonavir (100 mg) one tablet twice daily for 5 days., Disp: 30 tablet, Rfl: 0   ALPRAZolam (XANAX) 0.25 MG tablet, Take 1 tablet by mouth at bedtime as needed for anxiety., Disp: 30 tablet, Rfl: 0   Calcium Citrate (CAL-CITRATE PO), 1 poq, Disp: , Rfl:    Calcium-Magnesium-Vitamin D 200-100-33.3 MG-MG-UNIT CAPS, take one by mouth as needed Oral, Disp: , Rfl:    carboxymethylcellulose (REFRESH PLUS) 0.5 % SOLN, Place 1 drop into both eyes 2 (two) times daily as needed (dry eyes)., Disp: , Rfl:    Cholecalciferol (VITAMIN D3) 25 MCG (1000 UT) CAPS, Take 1,000 Units by mouth daily. , Disp: , Rfl:    dexamethasone (DECADRON) 4 MG tablet, Take 2 tablets by mouth the night before and 2 tablets the morning of chemotherapy, every 3 weeks, by mouth x 6 cycles, Disp: 24 tablet, Rfl: 0   diphenhydrAMINE (BENADRYL ALLERGY) 25 MG tablet, Take by mouth., Disp: , Rfl:    escitalopram (LEXAPRO) 10 MG tablet, 1 po qd Oral, Disp: , Rfl:    esomeprazole (NEXIUM) 20 MG capsule, Take 20 mg by mouth daily. , Disp: , Rfl:    estradiol (ESTRACE VAGINAL) 0.1 MG/GM vaginal cream, Place 1 applicatorful vaginally 3 (three) times a week., Disp: 42.5 g, Rfl: 12   furosemide (LASIX) 20 MG tablet, 1 tablet Orally Once a day, Disp: , Rfl:    hydrALAZINE (APRESOLINE) 10 MG tablet, 1 tablet with food Orally two times a day, Disp: , Rfl:    lidocaine-prilocaine (EMLA) cream, Apply topically as needed., Disp: 30 g, Rfl: 0   lisinopril (ZESTRIL) 20 MG tablet, Take 1 tablet (  20 mg total) by mouth daily., Disp: 90 tablet,  Rfl: 1   loratadine (CLARITIN) 10 MG tablet, Take 10 mg by mouth daily as needed for allergies., Disp: , Rfl:    Multiple Vitamins-Minerals (MULTI FOR HER PO), 1 poq, Disp: , Rfl:    mupirocin ointment (BACTROBAN) 2 %, 1 application Externally Twice a day, Disp: , Rfl:    ondansetron (ZOFRAN) 8 MG tablet, Take 1 tablet by mouth 2 times daily as needed. Start on the third day after chemotherapy., Disp: 30 tablet, Rfl: 1   oxyCODONE-acetaminophen (PERCOCET/ROXICET) 5-325 MG tablet, , Disp: , Rfl:    pantoprazole (PROTONIX) 40 MG tablet, 1 po qd on an empty stomach Oral, Disp: , Rfl:    prochlorperazine (COMPAZINE) 10 MG tablet, Take 1 tablet by mouth every 6 hours as needed (Nausea or vomiting)., Disp: 30 tablet, Rfl: 1  PHYSICAL EXAM: ECOG FS:1 - Symptomatic but completely ambulatory    Vitals:   03/22/23 1258  BP: (!) 145/78  Pulse: 84  Resp: 16  Temp: 98.3 F (36.8 C)  TempSrc: Oral  SpO2: 100%  Weight: 145 lb 12.8 oz (66.1 kg)   Physical Exam Vitals and nursing note reviewed.  Constitutional:      Appearance: She is not ill-appearing or toxic-appearing.  HENT:     Head: Normocephalic.     Nose:     Right Sinus: Frontal sinus tenderness present.     Left Sinus: Frontal sinus tenderness present.     Mouth/Throat:     Pharynx: Posterior oropharyngeal erythema present. No oropharyngeal exudate.  Eyes:     Conjunctiva/sclera: Conjunctivae normal.  Cardiovascular:     Rate and Rhythm: Normal rate and regular rhythm.     Pulses: Normal pulses.     Heart sounds: Normal heart sounds.  Pulmonary:     Effort: Pulmonary effort is normal.     Breath sounds: Normal breath sounds.  Abdominal:     General: There is no distension.  Musculoskeletal:     Cervical back: Normal range of motion.  Skin:    General: Skin is warm and dry.  Neurological:     Mental Status: She is alert.        LABORATORY DATA: I have reviewed the data as listed    Latest Ref Rng & Units 03/22/2023    12:57 PM 03/02/2023   11:50 AM 02/09/2023    7:49 AM  CBC  WBC 4.0 - 10.5 K/uL 5.2  8.3  7.1   Hemoglobin 12.0 - 15.0 g/dL 56.2  13.0  86.5   Hematocrit 36.0 - 46.0 % 34.3  37.4  36.0   Platelets 150 - 400 K/uL 195  213  209         Latest Ref Rng & Units 03/22/2023   12:57 PM 03/02/2023   11:50 AM 02/09/2023    7:49 AM  CMP  Glucose 70 - 99 mg/dL 784  696  295   BUN 8 - 23 mg/dL 8  13  14    Creatinine 0.44 - 1.00 mg/dL 2.84  1.32  4.40   Sodium 135 - 145 mmol/L 140  140  142   Potassium 3.5 - 5.1 mmol/L 3.6  3.8  3.6   Chloride 98 - 111 mmol/L 107  105  107   CO2 22 - 32 mmol/L 27  26  26    Calcium 8.9 - 10.3 mg/dL 9.0  10.2  9.5   Total Protein 6.5 - 8.1 g/dL 6.4  7.0  6.8   Total Bilirubin 0.3 - 1.2 mg/dL 1.0  0.8  0.9   Alkaline Phos 38 - 126 U/L 110  94  136   AST 15 - 41 U/L 53  32  64   ALT 0 - 44 U/L 57  35  78        RADIOGRAPHIC STUDIES (from last 24 hours if applicable) I have personally reviewed the radiological images as listed and agreed with the findings in the report. DG Chest 2 View  Result Date: 03/22/2023 CLINICAL DATA:  Cough EXAM: CHEST - 2 VIEW COMPARISON:  None Available. FINDINGS: Right-sided chest port terminates at the level of the distal SVC. The heart size and mediastinal contours are within normal limits. Both lungs are clear. The visualized skeletal structures are unremarkable. IMPRESSION: No active cardiopulmonary disease. Electronically Signed   By: Duanne Guess D.O.   On: 03/22/2023 16:29        Visit Diagnosis: 1. Goals of care, counseling/discussion   2. Left ovarian epithelial cancer (HCC)   3. Symptoms of upper respiratory infection (URI)      Orders Placed This Encounter  Procedures   Resp panel by RT-PCR (RSV, Flu A&B, Covid) Anterior Nasal Swab   DG Chest 2 View    Standing Status:   Future    Number of Occurrences:   1    Standing Expiration Date:   03/21/2024    Order Specific Question:   Reason for Exam  (SYMPTOM  OR DIAGNOSIS REQUIRED)    Answer:   cough, covid exposure, cancer patient    Order Specific Question:   Preferred imaging location?    Answer:   [facility]    All questions were answered. The patient knows to call the clinic with any problems, questions or concerns. No barriers to learning was detected.  A total of more than 30 minutes were spent on this encounter with face-to-face time and non-face-to-face time, including preparing to see the patient, ordering tests and/or medications, counseling the patient and coordination of care as outlined above.    Thank you for allowing me to participate in the care of this patient.    Shanon Ace, PA-C Department of Hematology/Oncology [facility] at [facility] Phone: [phone]  [phone]    03/22/2023 4:55 PM

## 2023-03-22 NOTE — Telephone Encounter (Signed)
Patient called - Cough and cold type upper respiratory symptoms since Monday. States she feels really bad. No fever. Home covid test today negative. Pt scheduled for treatment tomorrow. Dr. [name] informed.   Dr. [name] requested patient be evaluated in [facility] today.   Patient informed of Dr. [name] recomendation and is in agreement. [facility] contacted for appointment - patient to be seen this afternoon. MD informed.

## 2023-03-23 ENCOUNTER — Encounter: Payer: Self-pay | Admitting: Hematology and Oncology

## 2023-03-23 ENCOUNTER — Inpatient Hospital Stay: Payer: Medicare Other

## 2023-03-23 ENCOUNTER — Inpatient Hospital Stay: Payer: Medicare Other | Admitting: Hematology and Oncology

## 2023-03-23 ENCOUNTER — Telehealth: Payer: Self-pay | Admitting: Hematology and Oncology

## 2023-03-23 ENCOUNTER — Other Ambulatory Visit: Payer: Self-pay | Admitting: Hematology and Oncology

## 2023-03-23 NOTE — Telephone Encounter (Signed)
Left patient a vm regarding upcoming appointment. Left voicemail to call [phone] for covid protocol

## 2023-03-24 ENCOUNTER — Encounter: Payer: Self-pay | Admitting: Hematology and Oncology

## 2023-03-30 MED FILL — Fosaprepitant Dimeglumine For IV Infusion 150 MG (Base Eq): INTRAVENOUS | Qty: 5 | Status: AC

## 2023-03-31 ENCOUNTER — Encounter: Payer: Self-pay | Admitting: Hematology and Oncology

## 2023-03-31 ENCOUNTER — Inpatient Hospital Stay: Payer: Medicare Other | Admitting: Licensed Clinical Social Worker

## 2023-03-31 ENCOUNTER — Inpatient Hospital Stay: Payer: Medicare Other

## 2023-03-31 ENCOUNTER — Other Ambulatory Visit: Payer: Self-pay | Admitting: Hematology and Oncology

## 2023-03-31 ENCOUNTER — Inpatient Hospital Stay (HOSPITAL_BASED_OUTPATIENT_CLINIC_OR_DEPARTMENT_OTHER): Payer: Medicare Other | Admitting: Hematology and Oncology

## 2023-03-31 ENCOUNTER — Other Ambulatory Visit (HOSPITAL_COMMUNITY): Payer: Self-pay

## 2023-03-31 ENCOUNTER — Inpatient Hospital Stay: Payer: Medicare Other | Attending: Gynecologic Oncology

## 2023-03-31 VITALS — BP 137/78 | HR 93 | Temp 98.5°F | Resp 18 | Ht 62.0 in | Wt 143.0 lb

## 2023-03-31 DIAGNOSIS — C563 Malignant neoplasm of bilateral ovaries: Secondary | ICD-10-CM | POA: Diagnosis not present

## 2023-03-31 DIAGNOSIS — Z5111 Encounter for antineoplastic chemotherapy: Secondary | ICD-10-CM | POA: Insufficient documentation

## 2023-03-31 DIAGNOSIS — Z7189 Other specified counseling: Secondary | ICD-10-CM

## 2023-03-31 DIAGNOSIS — G620 Drug-induced polyneuropathy: Secondary | ICD-10-CM | POA: Diagnosis not present

## 2023-03-31 DIAGNOSIS — T451X5A Adverse effect of antineoplastic and immunosuppressive drugs, initial encounter: Secondary | ICD-10-CM | POA: Diagnosis not present

## 2023-03-31 DIAGNOSIS — C562 Malignant neoplasm of left ovary: Secondary | ICD-10-CM

## 2023-03-31 DIAGNOSIS — C786 Secondary malignant neoplasm of retroperitoneum and peritoneum: Secondary | ICD-10-CM | POA: Insufficient documentation

## 2023-03-31 DIAGNOSIS — C785 Secondary malignant neoplasm of large intestine and rectum: Secondary | ICD-10-CM | POA: Diagnosis not present

## 2023-03-31 LAB — CMP (CANCER CENTER ONLY)
ALT: 33 U/L (ref 0–44)
AST: 29 U/L (ref 15–41)
Albumin: 4.4 g/dL (ref 3.5–5.0)
Alkaline Phosphatase: 102 U/L (ref 38–126)
Anion gap: 8 (ref 5–15)
BUN: 16 mg/dL (ref 8–23)
CO2: 27 mmol/L (ref 22–32)
Calcium: 9.9 mg/dL (ref 8.9–10.3)
Chloride: 105 mmol/L (ref 98–111)
Creatinine: 0.55 mg/dL (ref 0.44–1.00)
GFR, Estimated: >60 mL/min (ref >60)
Glucose, Bld: 148 mg/dL — ABNORMAL HIGH (ref 70–99)
Potassium: 3.9 mmol/L (ref 3.5–5.1)
Sodium: 140 mmol/L (ref 135–145)
Total Bilirubin: 0.7 mg/dL (ref <1.2)
Total Protein: 6.8 g/dL (ref 6.5–8.1)

## 2023-03-31 LAB — CBC WITH DIFFERENTIAL (CANCER CENTER ONLY)
Abs Immature Granulocytes: 0.02 10*3/uL (ref 0.00–0.07)
Basophils Absolute: 0 10*3/uL (ref 0.0–0.1)
Basophils Relative: 0 %
Eosinophils Absolute: 0 10*3/uL (ref 0.0–0.5)
Eosinophils Relative: 0 %
HCT: 34.7 % — ABNORMAL LOW (ref 36.0–46.0)
Hemoglobin: 12.1 g/dL (ref 12.0–15.0)
Immature Granulocytes: 0 %
Lymphocytes Relative: 8 %
Lymphs Abs: 0.6 10*3/uL — ABNORMAL LOW (ref 0.7–4.0)
MCH: 33.5 pg (ref 26.0–34.0)
MCHC: 34.9 g/dL (ref 30.0–36.0)
MCV: 96.1 fL (ref 80.0–100.0)
Monocytes Absolute: 0.1 10*3/uL (ref 0.1–1.0)
Monocytes Relative: 1 %
Neutro Abs: 7.3 10*3/uL (ref 1.7–7.7)
Neutrophils Relative %: 91 %
Platelet Count: 223 10*3/uL (ref 150–400)
RBC: 3.61 MIL/uL — ABNORMAL LOW (ref 3.87–5.11)
RDW: 13.5 % (ref 11.5–15.5)
WBC Count: 8.1 10*3/uL (ref 4.0–10.5)
nRBC: 0 % (ref 0.0–0.2)

## 2023-03-31 MED ORDER — HEPARIN SOD (PORK) LOCK FLUSH 100 UNIT/ML IV SOLN
500.0000 [IU] | Freq: Once | INTRAVENOUS | Status: AC | PRN
  Administered 2023-03-31: 500 [IU]

## 2023-03-31 MED ORDER — PROCHLORPERAZINE MALEATE 10 MG PO TABS
10.0000 mg | ORAL_TABLET | Freq: Four times a day (QID) | ORAL | 1 refills | Status: DC | PRN
  Filled 2023-03-31: qty 30, 8d supply, fill #0

## 2023-03-31 MED ORDER — FAMOTIDINE IN NACL 20-0.9 MG/50ML-% IV SOLN
20.0000 mg | Freq: Once | INTRAVENOUS | Status: AC
Start: 2023-03-31 — End: 2023-03-31
  Administered 2023-03-31: 20 mg via INTRAVENOUS
  Filled 2023-03-31: qty 50

## 2023-03-31 MED ORDER — SODIUM CHLORIDE 0.9% FLUSH
10.0000 mL | INTRAVENOUS | Status: DC | PRN
Start: 2023-03-31 — End: 2023-03-31
  Administered 2023-03-31: 10 mL

## 2023-03-31 MED ORDER — PALONOSETRON HCL INJECTION 0.25 MG/5ML
0.2500 mg | Freq: Once | INTRAVENOUS | Status: AC
  Administered 2023-03-31: 0.25 mg via INTRAVENOUS
  Filled 2023-03-31: qty 5

## 2023-03-31 MED ORDER — CETIRIZINE HCL 10 MG/ML IV SOLN
10.0000 mg | Freq: Once | INTRAVENOUS | Status: AC
  Administered 2023-03-31: 10 mg via INTRAVENOUS
  Filled 2023-03-31: qty 1

## 2023-03-31 MED ORDER — SODIUM CHLORIDE 0.9 % IV SOLN
150.0000 mg | Freq: Once | INTRAVENOUS | Status: AC
  Administered 2023-03-31: 150 mg via INTRAVENOUS
  Filled 2023-03-31: qty 150
  Filled 2023-03-31: qty 5

## 2023-03-31 MED ORDER — LIDOCAINE-PRILOCAINE 2.5-2.5 % EX CREA
1.0000 | TOPICAL_CREAM | CUTANEOUS | 1 refills | Status: DC | PRN
  Filled 2023-03-31: qty 30, 10d supply, fill #0

## 2023-03-31 MED ORDER — SODIUM CHLORIDE 0.9 % IV SOLN
174.0000 mg | Freq: Once | INTRAVENOUS | Status: AC
  Administered 2023-03-31: 174 mg via INTRAVENOUS
  Filled 2023-03-31: qty 29

## 2023-03-31 MED ORDER — SODIUM CHLORIDE 0.9 % IV SOLN: Freq: Once | INTRAVENOUS | Status: AC

## 2023-03-31 MED ORDER — DEXAMETHASONE SODIUM PHOSPHATE 10 MG/ML IJ SOLN
10.0000 mg | Freq: Once | INTRAMUSCULAR | Status: AC
  Administered 2023-03-31: 10 mg via INTRAVENOUS
  Filled 2023-03-31: qty 1

## 2023-03-31 NOTE — Progress Notes (Signed)
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 It is only mild, not bothering the patient. I will observe for now If it gets worse in the future, I will consider modifying the dose of the treatment  Orders Placed This Encounter  Procedures   CT ABDOMEN PELVIS W CONTRAST    Standing Status:   Future    Standing Expiration Date:   03/30/2024    Order Specific Question:   If indicated for the ordered procedure, I authorize the administration of contrast media per Radiology protocol    Answer:   Yes    Order Specific Question:   Does the patient have a contrast media/X-ray dye allergy?    Answer:   No    Order Specific Question:   Preferred imaging location?    Answer:   [facility]    Order Specific Question:   If indicated for the ordered procedure, I authorize the administration of oral contrast media per Radiology protocol    Answer:   Yes    All questions were answered. The patient knows to call the clinic with any problems, questions or concerns. The total time spent in the appointment was 30 minutes encounter with patients including review of chart and various tests results, discussions about plan of care and coordination of care plan   Artis Delay, MD 03/31/2023 12:03 PM  INTERVAL HISTORY: Please see below for problem oriented charting. she returns for treatment follow-up She is seen in the  infusion room due to recent positive COVID test She is recovering well from recent infection She noticed some mild peripheral neuropathy, overall stable We discussed timing of her next imaging study and future plan of care  REVIEW OF SYSTEMS:   Constitutional: Denies fevers, chills or abnormal weight loss Eyes: Denies blurriness of vision Ears, nose, mouth, throat, and face: Denies mucositis or sore throat Respiratory: Denies cough, dyspnea or wheezes Cardiovascular: Denies palpitation, chest discomfort or lower extremity swelling Gastrointestinal:  Denies nausea, heartburn or change in bowel habits Skin: Denies abnormal skin rashes Lymphatics: Denies new lymphadenopathy or easy bruising Behavioral/Psych: Mood is stable, no new changes  All other systems were reviewed with the patient and are negative.  I have reviewed the past medical history, past surgical history, social history and family history with the patient and they are unchanged from previous note.  ALLERGIES:  is allergic to carboplatin, skin adhesives [cyanoacrylate], shrimp [shellfish allergy], sulfasalazine, sulfonamide derivatives, and telithromycin.  MEDICATIONS:  Current Outpatient Medications  Medication Sig Dispense Refill   ALPRAZolam (XANAX) 0.25 MG tablet Take 1 tablet by mouth at bedtime as needed for anxiety. 30 tablet 0   Calcium Citrate (CAL-CITRATE PO) 1 poq     Calcium-Magnesium-Vitamin D 200-100-33.3 MG-MG-UNIT CAPS take one by mouth as needed Oral     carboxymethylcellulose (REFRESH PLUS) 0.5 % SOLN Place 1 drop into both eyes 2 (two) times daily as needed (dry eyes).     Cholecalciferol (VITAMIN  D3) 25 MCG (1000 UT) CAPS Take 1,000 Units by mouth daily.      dexamethasone (DECADRON) 4 MG tablet Take 2 tablets by mouth the night before and 2 tablets the morning of chemotherapy, every 3 weeks, by mouth x 6 cycles 24 tablet 0   diphenhydrAMINE (BENADRYL ALLERGY) 25 MG tablet Take by mouth.     escitalopram  (LEXAPRO) 10 MG tablet 1 po qd Oral     esomeprazole (NEXIUM) 20 MG capsule Take 20 mg by mouth daily.      estradiol (ESTRACE VAGINAL) 0.1 MG/GM vaginal cream Place 1 applicatorful vaginally 3 (three) times a week. 42.5 g 12   furosemide (LASIX) 20 MG tablet 1 tablet Orally Once a day     hydrALAZINE (APRESOLINE) 10 MG tablet 1 tablet with food Orally two times a day     lidocaine-prilocaine (EMLA) cream Apply topically as needed. 30 g 1   lisinopril (ZESTRIL) 20 MG tablet Take 1 tablet (20 mg total) by mouth daily. 90 tablet 1   loratadine (CLARITIN) 10 MG tablet Take 10 mg by mouth daily as needed for allergies.     Multiple Vitamins-Minerals (MULTI FOR HER PO) 1 poq     mupirocin ointment (BACTROBAN) 2 % 1 application Externally Twice a day     ondansetron (ZOFRAN) 8 MG tablet Take 1 tablet by mouth 2 times daily as needed. Start on the third day after chemotherapy. 30 tablet 1   oxyCODONE-acetaminophen (PERCOCET/ROXICET) 5-325 MG tablet      pantoprazole (PROTONIX) 40 MG tablet 1 po qd on an empty stomach Oral     prochlorperazine (COMPAZINE) 10 MG tablet Take 1 tablet by mouth every 6 hours as needed (Nausea or vomiting). 30 tablet 1   No current facility-administered medications for this visit.   Facility-Administered Medications Ordered in Other Visits  Medication Dose Route Frequency Provider Last Rate Last Admin   0.9 %  sodium chloride infusion   Intravenous Once [name], Mouhamadou Gittleman, MD       cetirizine (QUZYTTIR) injection 10 mg  10 mg Intravenous Once [name], Panhia Karl, MD       dexamethasone (DECADRON) injection 10 mg  10 mg Intravenous Once [name], Millicent Blazejewski, MD       famotidine (PEPCID) IVPB 20 mg premix  20 mg Intravenous Once [name], Findley Vi, MD       fosaprepitant (EMEND) 150 mg in sodium chloride 0.9 % 145 mL IVPB  150 mg Intravenous Once [name], Arber Wiemers, MD       heparin lock flush 100 unit/mL  500 Units Intracatheter Once PRN [name], Salvador Coupe, MD       PACLitaxel (TAXOL) 174 mg in sodium chloride 0.9 %  250 mL chemo infusion (> 80mg /m2)  174 mg Intravenous Once [name], Georgio Hattabaugh, MD       palonosetron (ALOXI) injection 0.25 mg  0.25 mg Intravenous Once [name], Mikal Blasdell, MD       sodium chloride flush (NS) 0.9 % injection 10 mL  10 mL Intracatheter PRN [name], Aldeen Riga, MD        SUMMARY OF ONCOLOGIC HISTORY: Oncology History Overview Note  Her 2 neu 1+ MSI stable, serous, 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 14.7 cm poorly defined soft tissue mass in central pelvis suspicious for primary ovarian carcinoma, with uterine leiomyosarcoma considered a less likely differential diagnosis. 2. Moderate ascites and diffuse peritoneal carcinomatosis.  3. Several small uterine fibroids.   11/13/2018 Tumor Marker   Patient's tumor was tested for the following markers: CA-125 Results of the tumor marker test revealed 1434   11/21/2018 Procedure   Successful ultrasound-guided diagnostic and therapeutic paracentesis yielding 3.1 liters of peritoneal fluid   11/21/2018 Pathology Results   PERITONEAL/ASCITIC FLUID(SPECIMEN 1 OF 1 COLLECTED 11/21/18): ADENOCARCINOMA. Specimen Clinical Information Pelvic mass suspicious for ovarian cancer Source Peritoneal/Ascitic Fluid, (specimen 1 of 1 collected 11/21/18) Gross Specimen: Received is/are 1000 ccs of dark amber fluid. (CM:cm) Prepared: # Smears: 0 # Concentration Technique Slides (i.e. 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   11/29/2018 Initial Diagnosis   Ovarian cancer (HCC)   11/29/2018 Cancer Staging   Staging form: Ovary, Fallopian Tube, and Primary Peritoneal Carcinoma, AJCC 8th Edition - Clinical: cT3, cN0, cM0 - Signed by Artis Delay, MD on 11/29/2018   12/03/2018 Procedure   Successful ultrasound-guided therapeutic paracentesis  yielding 3.7 liters of peritoneal fluid.     12/06/2018 Procedure   Placement of a subcutaneous port device. Catheter tip at the SVC and right atrium junction   12/14/2018 Procedure   Successful ultrasound-guided paracentesis yielding 2.9 L of peritoneal fluid   12/28/2018 Tumor Marker   Patient's tumor was tested for the following markers: CA-125 Results of the tumor marker test revealed 812.   12/28/2018 - 04/22/2019 Chemotherapy   The patient had carboplatin and taxol for neoadjuvant treatment, followed by interval debulking surgery and subsequent adjuvant chemotherapy treatment.     02/01/2019 Imaging   Ct abdomen and pelvis 8.6 cm left ovarian mass, corresponding to the patient's known primary neoplasm, improved.   Mild peritoneal nodularity/omental caking, improved.   Small abdominopelvic ascites, improved.     02/01/2019 Tumor Marker   Patient's tumor was tested for the following markers: CA-125 Results of the tumor marker test revealed 183.   02/12/2019 Surgery   Preoperative Diagnosis: Stage IIIC ovarian cancer, s/p neoadjuvant chemotherapy      Procedure(s) Performed: 1. Exploratory laparotomy with total abdominal hysterectomy, bilateral salpingo-oophorectomy, omentectomy radical tumor debulking for ovarian cancer.   Surgeon: Luisa Dago, MD.    Operative Findings: upper abdomen free of disease. No visible omental disease. Small volume (200cc) ascites. 8cm friable mass replacing left ovary and adherent to the sigmoid colon mesentery and ureter on the left.  Anterior fibroid. This represented an optimal cytoreduction (R0) with no gross visible disease remaining.    02/12/2019 Pathology Results   A. OVARY AND FALLOPIAN TUBE, LEFT, SALPINGO-OOPHORECTOMY: - Serous carcinoma, high grade, status post neoadjuvant therapy - See oncology table and comment below B. UTERUS CERVIX WITH RIGHT FALLOPIAN TUBE AND OVARY, HYSTERECTOMY: Uterus: - Serosal surface involved by serous  carcinoma - Endomyometrium uninvolved by carcinoma - Benign endometrial polyp (4.1 cm) - Leiomyomata (5.5 cm; largest) - Adenomyosis Cervix: - Uninvolved by carcinoma Left ovary: - Serous carcinoma, high grade Left fallopian tube: - Serous carcinoma, high grade C. SOFT TISSUE, LEFT PELVIC SIDEWALL TUMOR, EXCISION: - Metastatic serous carcinoma, high-grade D. SOFT TISSUE, SIGMOID COLON MESENTERY, EXCISION: - Metastatic serous carcinoma, high-grade E. OMENTUM, TUMOR RESECTION: - Metastatic serous carcinoma, high-grade OVARY or FALLOPIAN TUBE or PRIMARY PERITONEUM: Procedure: Total hysterectomy and bilateral salpingo-oophorectomy, omentectomy and peritoneal biopsies Specimen Integrity: Fragmented Tumor Site: Left ovary and fallopian tube Ovarian Surface Involvement: Present Fallopian Tube Surface Involvement: Present Tumor Size: 6.3 cm (see comment) Histologic Type: Serous carcinoma Histologic Grade: High-grade Implants:  Not applicable Other Tissue/ Organ Involvement: Right ovary, right fallopian tube, omentum, mesentery Largest Extrapelvic Peritoneal Focus: Macroscopic Peritoneal/Ascitic Fluid: Malignant Treatment Effect: No definite or minimal response identified (chemotherapy response score 1 [CRS1] Regional Lymph Nodes: No lymph nodes submitted or found Pathologic Stage Classification (pTNM, AJCC 8th Edition): pT3c, pNX Representative Tumor Block: A4 Comment(s): Additional testing (HER-2, MMR and MSI) are pending. The primary tumor site appears to be the left ovary and fallopian tube. The uterus is only involved on the serosal surface. There is tumor on the anterior peritoneal reflection.  Addendum: Tumor is Her2 negative,MSI stable   02/19/2019 Tumor Marker   Patient's tumor was tested for the following markers: CA-125 Results of the tumor marker test revealed 40.3   04/01/2019 Tumor Marker   Patient's tumor was tested for the following markers: CA-125 Results of the tumor  marker test revealed 8.1    Genetic Testing   Negative testing. No pathogenic variants identified on the [employer]. The report date is 04/17/2019.  Somatic genes analyzed through [facility]: ATM, BARD1, BRCA1, BRCA2, BRIP1, CHEK2, MRE11A, NBN, PALB2, RAD51C, RAD51D.  The [facility] gene panel offered by [employer] includes sequencing and rearrangement analysis for the following 36 genes: APC*, ATM*, AXIN2, BARD1, BMPR1A, BRCA1*, BRCA2*, BRIP1*, CDH1*, CDK4, CDKN2A, CHEK2*, DICER1, MLH1*, MSH2*, MSH3, MSH6*, MUTYH*, NBN, NF1*, NTHL1, PALB2*, PMS2*, PTEN*, RAD51C*, RAD51D*, RECQL, SMAD4, SMARCA4, STK11 and TP53* (sequencing and deletion/duplication); HOXB13, POLD1 and POLE (sequencing only); EPCAM and GREM1 (deletion/duplication only).    05/21/2019 Imaging   1. Interval hysterectomy, oophorectomy and omentectomy. No evidence of residual ovarian carcinoma in the pelvis. No evidence of peritoneal disease. No intraperitoneal free fluid. 2. Nonobstructing LEFT renal calculi.  Normal ureters.   05/22/2019 Tumor Marker   Patient's tumor was tested for the following markers: CA-125 Results of the tumor marker test revealed 5.7.   06/03/2019 Procedure   Successful right IJ vein Port-A-Cath explant.   08/26/2019 Tumor Marker   Patient's tumor was tested for the following markers: CA-125. Results of the tumor marker test revealed 4.9   10/03/2019 Imaging   1. New tumor deposits along the capsular surfaces of the liver, spleen, in the left pelvis, and right paracolic gutter, compatible with metastatic disease. 2. Mild dilation of the dorsal pancreatic duct in the pancreatic body and head, cause uncertain. 3. Nonobstructive left nephrolithiasis. 4. Aortic atherosclerosis.   Aortic Atherosclerosis (ICD10-I70.0   10/10/2019 Procedure   Successful placement of a right IJ approach Power Port with ultrasound and fluoroscopic guidance. The catheter is ready for use.      10/11/2019 Tumor Marker   Patient's tumor was tested for the following markers: CA-125 Results of the tumor marker test revealed 19.6.   10/14/2019 -  Chemotherapy   The patient had carboplatin and gemzar for chemotherapy treatment.     11/04/2019 Tumor Marker   Patient's tumor was tested for the following markers: CA-125 Results of the tumor marker test revealed 11.4   11/28/2019 Tumor Marker   Patient's tumor was tested for the following markers: CA-125 Results of the tumor marker test revealed 5.8   12/20/2019 Imaging   1. Significant interval reduction in mixed solid and cystic nodule in the vicinity of the left ovary, with significant improvement or resolution of multiple pelvic, peritoneal, and organ capsule implants. Resolution of previously seen small volume ascites. Findings are consistent with treatment response of abdominal metastatic disease. 2. Status post hysterectomy, oophorectomy, and omentectomy. 3. Nonobstructive left nephrolithiasis. 4. Aortic Atherosclerosis (  ICD10-I70.0).   12/23/2019 Tumor Marker   Patient's tumor was tested for the following markers: CA-125 Results of the tumor marker test revealed 5.0   01/24/2020 Tumor Marker   Patient's tumor was tested for the following markers: CA-125 Results of the tumor marker test revealed 4.8   02/24/2020 Tumor Marker   Patient's tumor was tested for the following markers: CA-125. Results of the tumor marker test revealed 4.3.   03/06/2020 Imaging   1. Status post hysterectomy, bilateral oophorectomy, and omentectomy. 2. Resolution of previously described left adnexal nodularity. 3. No residual disease identified. 4.  Aortic Atherosclerosis (ICD10-I70.0). 5. Left nephrolithiasis.   04/20/2020 Tumor Marker   Patient's tumor was tested for the following markers: CA-125 Results of the tumor marker test revealed 4.6   05/18/2020 Tumor Marker   Patient's tumor was tested for the following markers: CA-125 Results of the  tumor marker test revealed 4.7   05/29/2020 Imaging   Status post hysterectomy and bilateral salpingo oophorectomy.   No evidence of recurrent or metastatic disease.   06/08/2020 - 09/13/2021 Chemotherapy   She was on niraparib       06/17/2020 Tumor Marker   Patient's tumor was tested for the following markers: CA-125 Results of the tumor marker test revealed 4.5   08/04/2020 Tumor Marker   Patient's tumor was tested for the following markers: CA-125. Results of the tumor marker test revealed 5.7   09/07/2020 Imaging   1. Status post hysterectomy and bilateral salpingo oophorectomy. No evidence of recurrent or metastatic disease. 2. Nonobstructing left nephrolithiasis. 3. Left-sided colonic diverticulosis without findings of acute diverticulitis. 4. Aortic atherosclerosis.     09/07/2020 Tumor Marker   Patient's tumor was tested for the following markers: CA-125 Results of the tumor marker test revealed 4.8.   10/21/2020 Tumor Marker   Patient's tumor was tested for the following markers: CA-125. Results of the tumor marker test revealed 5.1.   12/14/2020 Tumor Marker   Patient's tumor was tested for the following markers: CA-125. Results of the tumor marker test revealed 5.5.   04/05/2021 Tumor Marker   Patient's tumor was tested for the following markers: CA-125. Results of the tumor marker test revealed 5.9.   05/31/2021 Tumor Marker   Patient's tumor was tested for the following markers: CA-125. Results of the tumor marker test revealed 8.1.   09/13/2021 Imaging   1. Interval development of multiple new cystic and solid lesions in the abdomen and pelvis measuring up to 10.3 x 7.2 cm and consistent with peritoneal metastatic disease. 2. Nonobstructing left renal stones. 3. Aortic Atherosclerosis (ICD10-I70.0).     09/14/2021 Tumor Marker   Patient's tumor was tested for the following markers: CA-125. Results of the tumor marker test revealed 28.5.   09/16/2021  Echocardiogram   1. Left ventricular ejection fraction, by estimation, is 60 to 65%. The left ventricle has normal function. The left ventricle has no regional wall motion abnormalities. Left ventricular diastolic parameters are consistent with Grade I diastolic dysfunction (impaired relaxation).  2. Right ventricular systolic function is normal. The right ventricular size is normal.  3. The mitral valve is normal in structure. Trivial mitral valve regurgitation. No evidence of mitral stenosis.  4. The aortic valve has an indeterminant number of cusps. Aortic valve regurgitation is trivial. No aortic stenosis is present.  5. The inferior vena cava is normal in size with greater than 50% respiratory variability, suggesting right atrial pressure of 3 mmHg.     09/20/2021 - 01/17/2022  Chemotherapy   Patient is on Treatment Plan : OVARIAN RECURRENT Liposomal Doxorubicin + Carboplatin q28d X 6 Cycles     09/20/2021 - 02/22/2022 Chemotherapy   Patient is on Treatment Plan : OVARIAN RECURRENT Liposomal Doxorubicin + Carboplatin q28d X 6 Cycles     09/23/2021 Tumor Marker   Patient's tumor was tested for the following markers: CA-125. Results of the tumor marker test revealed 27.7.   11/24/2021 Tumor Marker   Patient's tumor was tested for the following markers: CA-125. Results of the tumor marker test revealed 28.4.   12/13/2021 Imaging   1. Overall stable peritoneal carcinomatosis. No new disease is demonstrated. 2. Moderate left-sided hydroureteronephrosis likely due to compression of the left ureter by the left pelvic mass. 3. Stable left-sided renal calculi. 4. Numerous left renal calculi.     12/22/2021 Tumor Marker   Patient's tumor was tested for the following markers: CA-125. Results of the tumor marker test revealed 24.7.   01/18/2022 Tumor Marker   Patient's tumor was tested for the following markers: CA-125. Results of the tumor marker test revealed 9.9.   01/27/2022 Echocardiogram    1.  Left ventricular ejection fraction, by estimation, is 60 to 65%. The left ventricle has normal function. The left ventricle has no regional wall motion abnormalities. Left ventricular diastolic parameters are consistent with Grade I diastolic dysfunction (impaired relaxation). The average left ventricular global longitudinal strain is -21.1 %. The global longitudinal strain is normal.  2. Right ventricular systolic function is normal. The right ventricular size is normal.  3. The mitral valve is abnormal. Mild mitral valve regurgitation.  4. The aortic valve is tricuspid. Aortic valve regurgitation is trivial.  5. The inferior vena cava is normal in size with greater than 50% respiratory variability, suggesting right atrial pressure of 3 mmHg.   02/23/2022 Tumor Marker   Patient's tumor was tested for the following markers: CA-125. Results of the tumor marker test revealed 8.5.   03/18/2022 Imaging   1. Diminished size of nodular and mixed solid and cystic peritoneal implants in the hepatorenal fossa, right lower quadrant, and left pelvis. 2. Unchanged solid mass in the right pelvis measuring 4.0 x 3.7 cm. 3. Constellation of findings is overall consistent with treatment response 4. Status post hysterectomy, oophorectomy, and omentectomy. 5. Nonobstructive left nephrolithiasis.   Aortic Atherosclerosis (ICD10-I70.0).     03/22/2022 - 08/16/2022 Chemotherapy   Patient is on Treatment Plan : Ovarian Bevacizumab q21d     03/24/2022 Tumor Marker   Patient's tumor was tested for the following markers: CA-125. Results of the tumor marker test revealed 7.1.   04/28/2022 Tumor Marker   Patient's tumor was tested for the following markers: CA-125. Results of the tumor marker test revealed 7.0.   05/27/2022 Tumor Marker   Patient's tumor was tested for the following markers: CA-125. Results of the tumor marker test revealed 7.7.   06/14/2022 Imaging   1. Slight response to therapy as evidenced by  decrease in size of a cystic and solid mass in the left adnexa as well as peritoneal implants in the right paracolic gutter. Solid right adnexal mass and additional peritoneal implants along right hepatic lobe and in the left anatomic pelvis appear stable. 2. Hepatic steatosis. 3. Left renal stones. 4.  Aortic atherosclerosis (ICD10-I70.0).   06/17/2022 Tumor Marker   Patient's tumor was tested for the following markers: CA-125. Results of the tumor marker test revealed 11.2.   09/05/2022 Imaging   CT ABDOMEN PELVIS W CONTRAST  Result Date: 09/06/2022 CLINICAL DATA:  Ovarian cancer.  Restaging.  * Tracking Code: BO * EXAM: CT ABDOMEN AND PELVIS WITH CONTRAST TECHNIQUE: Multidetector CT imaging of the abdomen and pelvis was performed using the standard protocol following bolus administration of intravenous contrast. RADIATION DOSE REDUCTION: This exam was performed according to the departmental dose-optimization program which includes automated exposure control, adjustment of the mA and/or kV according to patient size and/or use of iterative reconstruction technique. CONTRAST:  OMNIPAQUE IOHEXOL 300 MG/ML  SOLN COMPARISON:  06/13/2022 FINDINGS: Lower chest: Unremarkable. Hepatobiliary: No suspicious focal abnormality within the liver parenchyma. There is no evidence for gallstones, gallbladder wall thickening, or pericholecystic fluid. No intrahepatic or extrahepatic biliary dilation. Pancreas: No pancreatic mass lesion evident. Diffuse prominence of the main pancreatic duct is stable measuring up to 3-4 mm diameter in the head of the pancreas. Spleen: No splenomegaly. No focal mass lesion. Adrenals/Urinary Tract: No adrenal nodule or mass. Right kidney unremarkable. A cluster of nonobstructing stones in the upper pole right kidney is again noted with dominant stone measuring about 6 mm in size. No evidence for hydroureter. The urinary bladder appears normal for the degree of distention.  Stomach/Bowel: Tiny hiatal hernia. Stomach otherwise unremarkable. Duodenum is normally positioned as is the ligament of Treitz. No small bowel wall thickening. No small bowel dilatation. The terminal ileum is normal. The appendix is normal. No gross colonic mass. No colonic wall thickening. Diverticular changes are noted in the left colon without evidence of diverticulitis. Vascular/Lymphatic: There is mild atherosclerotic calcification of the abdominal aorta without aneurysm. There is no gastrohepatic or hepatoduodenal ligament lymphadenopathy. No retroperitoneal or mesenteric lymphadenopathy. No pelvic sidewall lymphadenopathy. Reproductive: Right-sided adnexal mass measured previously at 4.2 x 3.8 cm is now 4.9 x 4.6 cm. Cystic and solid left adnexal lesion measured previously at 4.1 x 3.4 cm is now 3.2 x 2.9 cm. 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 The more anterior of the 2 index lesions measures 2.6 x 2.1 cm today compared to 2.4 x 1.6 cm previously. The more posterior lesion is 2.4 x 1.4 cm today compared to 2.0 x 1.9 cm previously. 5 mm soft tissue nodule identified in the inferior paracolic gutter previously at 5 mm measures 10 mm today on image 51/2. Implants along the left posterior peritoneum adjacent to the psoas muscle and iliac vessels appear progressive. 1.6 x 1.3 cm lesion visible on 58/2 was 1.1 x 0.8 cm previously (remeasured). No evidence for ascites. Musculoskeletal: No worrisome lytic or sclerotic osseous abnormality. IMPRESSION: 1. Overall generalized appearance of mild disease progression. 2. Index right-sided adnexal mass has progressed since the prior with interval decrease in size of the cystic and solid left adnexal lesion. 3. Interval progression of index capsular implants along the medial right liver. 4. Interval progression of soft tissue nodule in the inferior right paracolic gutter and peritoneal implants in the lower left abdomen and pelvis. 5. No  evidence for ascites. 6. Nonobstructing left renal stones. 7. Tiny hiatal hernia. 8. Left colonic diverticulosis without diverticulitis. 9.  Aortic Atherosclerosis (ICD10-I70.0). Electronically Signed   By: Kennith Center M.D.   On: 09/06/2022 07:57      09/15/2022 -  Chemotherapy   Patient is on Treatment Plan : OVARIAN Paclitaxel (175) q21d X 6 Cycles     11/16/2022 Imaging   CT ABDOMEN PELVIS W CONTRAST  Result Date: 11/16/2022 CLINICAL DATA:  Ovarian carcinoma. Assess treatment response. * Tracking Code: BO * EXAM: CT ABDOMEN  AND PELVIS WITH CONTRAST TECHNIQUE: Multidetector CT imaging of the abdomen and pelvis was performed using the standard protocol following bolus administration of intravenous contrast. RADIATION DOSE REDUCTION: This exam was performed according to the departmental dose-optimization program which includes automated exposure control, adjustment of the mA and/or kV according to patient size and/or use of iterative reconstruction technique. CONTRAST:  OMNIPAQUE IOHEXOL 300 MG/ML  SOLN COMPARISON:  None Available. FINDINGS: Lower chest: Lung bases are clear. Hepatobiliary: No focal hepatic lesion. Normal gallbladder. No biliary duct dilatation. Common bile duct is normal. Peritoneal implant described along the margin the RIGHT hepatic liver is much less conspicuous. Capsular lesion measuring 12 mm on image 14/2 is decreased from 26 mm. Pancreas: Pancreas is normal. No ductal dilatation. No pancreatic inflammation. Spleen: Normal spleen Adrenals/urinary tract: Adrenal glands normal. Multiple calculi within LEFT kidney without obstruction. Ureters and bladder normal. Stomach/Bowel: Stomach, small bowel, appendix, and cecum are normal. Multiple diverticula of the descending colon and sigmoid colon without acute inflammation. Vascular/Lymphatic: Abdominal aorta is normal caliber. No periportal or retroperitoneal adenopathy. No pelvic adenopathy. Reproductive: Solid enhancing mass in the  deep RIGHT pelvis adjacent the rectum measures 4.0 x 4.3 cm compared to 4.6 x 4.9 cm. Small adjacent satellite nodule measuring 1.1 cm (image 63) is unchanged. Cystic lesion of the LEFT adnexa with mural nodularity measures 3.0 x 2.4 cm compared to 3.2 x 2.9 cm. No new nodularity in the pelvis. Post hysterectomy. Other: Interval decrease in the peritoneal nodularity. For example peritoneal nodule adjacent to the cecum in the RIGHT iliac fossa measures 6 mm (image 43/2 compared to 10 mm on prior. Lesions on the LEFT iliac vessels less prominent. For example 7 mm lesion on 50/2 decreased from 16 mm. Musculoskeletal: No aggressive osseous lesion. IMPRESSION: 1. Interval decrease in size of RIGHT pelvic mass. 2. Interval decrease in size of LEFT adnexal cystic mass. 3. Interval decrease in size of peritoneal nodularity. 4. Interval decrease in size of perihepatic peritoneal implant. 5. No new metastatic disease. Electronically Signed   By: Genevive Bi M.D.   On: 11/16/2022 14:34      02/08/2023 Imaging   CT ABDOMEN PELVIS W CONTRAST  Result Date: 02/08/2023 CLINICAL DATA:  Ovarian carcinoma.  Assess treatment response. EXAM: CT ABDOMEN AND PELVIS WITH CONTRAST TECHNIQUE: Multidetector CT imaging of the abdomen and pelvis was performed using the standard protocol following bolus administration of intravenous contrast. RADIATION DOSE REDUCTION: This exam was performed according to the departmental dose-optimization program which includes automated exposure control, adjustment of the mA and/or kV according to patient size and/or use of iterative reconstruction technique. CONTRAST:  OMNIPAQUE IOHEXOL 300 MG/ML  SOLN COMPARISON:  CT 11/14/2022 FINDINGS: Lower chest: Lung bases are clear. Hepatobiliary: No focal hepatic lesion. Normal gallbladder. No biliary duct dilatation. Common bile duct is normal. Small peritoneal implant along the RIGHT hepatic lobe is less conspicuous at 6 mm (image 18/2) compared to 11  mm on prior. Pancreas: Pancreas is normal. No ductal dilatation. No pancreatic inflammation. Spleen: Normal spleen Adrenals/urinary tract: Adrenal glands normal. Multiple nonobstructing calculi in the upper pole of the LEFT kidney. Ureters and bladder normal. Stomach/Bowel: Stomach, small bowel, appendix, and cecum are normal. The colon and rectosigmoid colon are normal. Vascular/Lymphatic: Abdominal aorta is normal caliber with atherosclerotic calcification. There is no retroperitoneal or periportal lymphadenopathy. No pelvic lymphadenopathy. Reproductive: Post hysterectomy anatomy. Again demonstrated rounded mass posterior to the vaginal cuff just RIGHT of midline measures 3.7 by 3.0 cm (image 69/series  2) compared to 4.3 x 4.0 cm on most recent CT. Part cystic and solid lesion in the LEFT adnexa measures 2.2 x 1.6 cm (image 65) compared to 3.0 x 2.2 cm. Previously measured peritoneal implant in the deep RIGHT pelvis is no longer identified. No new mass lesions within the pelvis. Other: No new peritoneal disease.  No free fluid. Musculoskeletal: No aggressive osseous lesion. IMPRESSION: 1. Interval decrease in size rounded mass in the deep RIGHT pelvis. 2. Interval decrease in size of cystic LEFT adnexal lesion. 3. Decrease in size of peritoneal implants. 4. No evidence of new metastatic disease. No intraperitoneal free fluid Electronically Signed   By: Genevive Bi M.D.   On: 02/08/2023 10:56        PHYSICAL EXAMINATION: ECOG PERFORMANCE STATUS: 1 - Symptomatic but completely ambulatory  There were no vitals filed for this visit. There were no vitals filed for this visit.  GENERAL:alert, no distress and comfortable  NEURO: alert & oriented x 3 with fluent speech, no focal motor/sensory deficits  LABORATORY DATA:  I have reviewed the data as listed    Component Value Date/Time   NA 140 03/31/2023 1013   K 3.9 03/31/2023 1013   CL 105 03/31/2023 1013   CO2 27 03/31/2023 1013   GLUCOSE 148  (H) 03/31/2023 1013   BUN 16 03/31/2023 1013   CREATININE 0.55 03/31/2023 1013   CALCIUM 9.9 03/31/2023 1013   PROT 6.8 03/31/2023 1013   ALBUMIN 4.4 03/31/2023 1013   AST 29 03/31/2023 1013   ALT 33 03/31/2023 1013   ALKPHOS 102 03/31/2023 1013   BILITOT 0.7 03/31/2023 1013   GFRNONAA >60 03/31/2023 1013   GFRAA >60 02/24/2020 0852    No results found for: "SPEP", "UPEP"  Lab Results  Component Value Date   WBC 8.1 03/31/2023   NEUTROABS 7.3 03/31/2023   HGB 12.1 03/31/2023   HCT 34.7 (L) 03/31/2023   MCV 96.1 03/31/2023   PLT 223 03/31/2023      Chemistry      Component Value Date/Time   NA 140 03/31/2023 1013   K 3.9 03/31/2023 1013   CL 105 03/31/2023 1013   CO2 27 03/31/2023 1013   BUN 16 03/31/2023 1013   CREATININE 0.55 03/31/2023 1013      Component Value Date/Time   CALCIUM 9.9 03/31/2023 1013   ALKPHOS 102 03/31/2023 1013   AST 29 03/31/2023 1013   ALT 33 03/31/2023 1013   BILITOT 0.7 03/31/2023 1013

## 2023-03-31 NOTE — Assessment & Plan Note (Signed)
Her last imaging show positive response to therapy Due to reaction to carboplatin, she will be getting single agent paclitaxel only We will proceed with treatment today with similar dose adjustment as before I plan to repeat imaging study in 2 weeks for further follow-up

## 2023-03-31 NOTE — Progress Notes (Signed)
[facility] CSW Progress Note  Visual merchandiser met with patient in infusion to follow-up on coping after this CSW returned from leave. Pt overall has done well the last few months although she currently has covid, so has been dealing with feeling less well and she knows she will have a couple of days after treatment where she feels worse.  Overall, pt continues to try to have positive energy and utilizing prayer and gratitude. She also continues to be active walking when possible and working.   CSW will continue to be available to pt by phone and during visits.   Frederika Hukill E Beya Tipps, LCSW Clinical Social Worker [facility]

## 2023-03-31 NOTE — Assessment & Plan Note (Signed)
she has mild peripheral neuropathy, likely related to side effects of treatment. It is only mild, not bothering the patient. I will observe for now If it gets worse in the future, I will consider modifying the dose of the treatment  

## 2023-04-02 ENCOUNTER — Other Ambulatory Visit: Payer: Self-pay

## 2023-04-03 ENCOUNTER — Encounter: Payer: Self-pay | Admitting: Hematology and Oncology

## 2023-04-10 ENCOUNTER — Ambulatory Visit (HOSPITAL_COMMUNITY)
Admission: RE | Admit: 2023-04-10 | Discharge: 2023-04-10 | Disposition: A | Discharge status: Home / Self Care | Payer: Medicare Other | Source: Ambulatory Visit | Attending: Hematology and Oncology | Admitting: Hematology and Oncology

## 2023-04-10 DIAGNOSIS — C562 Malignant neoplasm of left ovary: Secondary | ICD-10-CM | POA: Diagnosis present

## 2023-04-10 MED ORDER — IOHEXOL 300 MG/ML  SOLN
100.0000 mL | Freq: Once | INTRAMUSCULAR | Status: AC | PRN
  Administered 2023-04-10: 100 mL via INTRAVENOUS

## 2023-04-10 MED ORDER — IOHEXOL 300 MG/ML  SOLN
30.0000 mL | Freq: Once | INTRAMUSCULAR | Status: AC | PRN
  Administered 2023-04-10: 30 mL via ORAL

## 2023-04-11 ENCOUNTER — Other Ambulatory Visit (HOSPITAL_COMMUNITY): Payer: Self-pay

## 2023-04-11 ENCOUNTER — Encounter: Payer: Self-pay | Admitting: Hematology and Oncology

## 2023-04-12 ENCOUNTER — Other Ambulatory Visit (HOSPITAL_COMMUNITY): Payer: Self-pay

## 2023-04-13 ENCOUNTER — Ambulatory Visit: Payer: Medicare Other

## 2023-04-13 ENCOUNTER — Other Ambulatory Visit: Payer: Medicare Other

## 2023-04-13 ENCOUNTER — Ambulatory Visit: Payer: Medicare Other | Admitting: Hematology and Oncology

## 2023-04-14 ENCOUNTER — Other Ambulatory Visit (HOSPITAL_COMMUNITY): Payer: Self-pay

## 2023-04-14 ENCOUNTER — Encounter: Payer: Self-pay | Admitting: Hematology and Oncology

## 2023-04-14 ENCOUNTER — Telehealth: Payer: Self-pay

## 2023-04-14 ENCOUNTER — Inpatient Hospital Stay (HOSPITAL_BASED_OUTPATIENT_CLINIC_OR_DEPARTMENT_OTHER): Payer: Medicare Other | Admitting: Hematology and Oncology

## 2023-04-14 VITALS — BP 146/84 | HR 81 | Resp 18 | Ht 62.0 in | Wt 146.4 lb

## 2023-04-14 DIAGNOSIS — T50905A Adverse effect of unspecified drugs, medicaments and biological substances, initial encounter: Secondary | ICD-10-CM | POA: Diagnosis not present

## 2023-04-14 DIAGNOSIS — Z7189 Other specified counseling: Secondary | ICD-10-CM

## 2023-04-14 DIAGNOSIS — C562 Malignant neoplasm of left ovary: Secondary | ICD-10-CM

## 2023-04-14 DIAGNOSIS — I158 Other secondary hypertension: Secondary | ICD-10-CM | POA: Diagnosis not present

## 2023-04-14 DIAGNOSIS — Z5111 Encounter for antineoplastic chemotherapy: Secondary | ICD-10-CM | POA: Diagnosis not present

## 2023-04-14 MED ORDER — LIDOCAINE-PRILOCAINE 2.5-2.5 % EX CREA: 1.0000 | TOPICAL_CREAM | CUTANEOUS | 1 refills | Status: AC | PRN

## 2023-04-14 MED ORDER — LISINOPRIL 10 MG PO TABS: 10.0000 mg | ORAL_TABLET | Freq: Every day | ORAL | Status: DC

## 2023-04-14 MED ORDER — PROCHLORPERAZINE MALEATE 10 MG PO TABS: 10.0000 mg | ORAL_TABLET | Freq: Four times a day (QID) | ORAL | 1 refills | Status: DC | PRN

## 2023-04-14 NOTE — Telephone Encounter (Signed)
Called and given appt for echo on Tuesday on 11/26 at 1200, arrive at 1145. She is aware of appt.

## 2023-04-14 NOTE — Progress Notes (Signed)
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[facility]) I have reviewed multiple imaging studies with the patient Unfortunately, she has disease progression on paclitaxel We would discontinue this Previously, the patient did not progress on liposomal doxorubicin or gemcitabine Other options including docetaxel and topotecan were also discussed Ultimately, after long discussion, she is in agreement to proceed with liposomal doxorubicin We will get pretreatment echocardiogram and start her treatment first week of December I will plan to repeat imaging study after 3 cycles of therapy  Orders Placed This Encounter  Procedures   CBC with Differential (Cancer Center Only)    Standing Status:   Future    Standing Expiration Date:   04/25/2024   CMP (Cancer Center only)    Standing Status:   Future    Standing Expiration Date:   04/25/2024   CBC with Differential (Cancer Center Only)    Standing Status:   Future    Standing Expiration Date:   05/24/2024   CMP (Cancer Center only)    Standing Status:   Future    Standing Expiration Date:   05/24/2024   CBC with Differential (Cancer Center Only)    Standing Status:   Future    Standing Expiration Date:   06/21/2024   CMP (Cancer Center only)    Standing Status:   Future    Standing Expiration Date:   06/21/2024   CBC with Differential (Cancer Center Only)    Standing Status:   Future    Standing Expiration Date:   07/19/2024   CMP (Cancer Center only)    Standing Status:   Future    Standing Expiration Date:   07/19/2024   CBC with Differential (Cancer Center Only)    Standing Status:   Future    Standing Expiration Date:   08/16/2024   CMP (Cancer Center only)    Standing Status:   Future    Standing Expiration Date:   08/16/2024   CBC with Differential (Cancer Center  Only)    Standing Status:   Future    Standing Expiration Date:   09/13/2024   CMP (Cancer Center only)    Standing Status:   Future    Standing Expiration Date:   09/13/2024   ECHOCARDIOGRAM COMPLETE    Standing Status:   Future    Standing Expiration Date:   04/13/2024    Order Specific Question:   Where should this test be performed    Answer:   [facility]    Order Specific Question:   Perflutren DEFINITY (image enhancing agent) should be administered unless hypersensitivity or allergy exist    Answer:   Administer Perflutren    Order Specific Question:   Reason for exam-Echo    Answer:   Chemo  Z09    All questions were answered. The patient knows to call the clinic with any problems, questions or concerns. The total time spent in the appointment was 40 minutes encounter with patients including review of chart and various tests results, discussions about plan of care and coordination of care plan   Artis Delay, MD 04/14/2023 3:24 PM  INTERVAL HISTORY: Please see below for problem oriented charting. she returns for review of test results She is not symptomatic We discussed reviewed imaging study and discussed various treatment options  REVIEW OF SYSTEMS:   Constitutional:  Denies fevers, chills or abnormal weight loss Eyes: Denies blurriness of vision Ears, nose, mouth, throat, and face: Denies mucositis or sore throat Respiratory: Denies cough, dyspnea or wheezes Cardiovascular: Denies palpitation, chest discomfort or lower extremity swelling Gastrointestinal:  Denies nausea, heartburn or change in bowel habits Skin: Denies abnormal skin rashes Lymphatics: Denies new lymphadenopathy or easy bruising Neurological:Denies numbness, tingling or new weaknesses Behavioral/Psych: Mood is stable, no new changes  All other systems were reviewed with the patient and are negative.  I have reviewed the past medical history, past surgical history, social history and family history with  the patient and they are unchanged from previous note.  ALLERGIES:  is allergic to carboplatin, skin adhesives [cyanoacrylate], shrimp [shellfish allergy], sulfasalazine, sulfonamide derivatives, and telithromycin.  MEDICATIONS:  Current Outpatient Medications  Medication Sig Dispense Refill   ALPRAZolam (XANAX) 0.25 MG tablet Take 1 tablet by mouth at bedtime as needed for anxiety. 30 tablet 0   Calcium Citrate (CAL-CITRATE PO) 1 poq     Calcium-Magnesium-Vitamin D 200-100-33.3 MG-MG-UNIT CAPS take one by mouth as needed Oral     carboxymethylcellulose (REFRESH PLUS) 0.5 % SOLN Place 1 drop into both eyes 2 (two) times daily as needed (dry eyes).     Cholecalciferol (VITAMIN D3) 25 MCG (1000 UT) CAPS Take 1,000 Units by mouth daily.      diphenhydrAMINE (BENADRYL ALLERGY) 25 MG tablet Take by mouth.     escitalopram (LEXAPRO) 10 MG tablet 1 po qd Oral     esomeprazole (NEXIUM) 20 MG capsule Take 20 mg by mouth daily.      estradiol (ESTRACE VAGINAL) 0.1 MG/GM vaginal cream Place 1 applicatorful vaginally 3 (three) times a week. 42.5 g 12   lidocaine-prilocaine (EMLA) cream Apply topically as needed. 30 g 1   lisinopril (ZESTRIL) 10 MG tablet Take 1 tablet (10 mg total) by mouth daily.     loratadine (CLARITIN) 10 MG tablet Take 10 mg by mouth daily as needed for allergies.     Multiple Vitamins-Minerals (MULTI FOR HER PO) 1 poq     mupirocin ointment (BACTROBAN) 2 % 1 application Externally Twice a day     ondansetron (ZOFRAN) 8 MG tablet Take 1 tablet by mouth 2 times daily as needed. Start on the third day after chemotherapy. 30 tablet 1   pantoprazole (PROTONIX) 40 MG tablet 1 po qd on an empty stomach Oral     prochlorperazine (COMPAZINE) 10 MG tablet Take 1 tablet by mouth every 6 hours as needed (Nausea or vomiting). 30 tablet 1   No current facility-administered medications for this visit.    SUMMARY OF ONCOLOGIC HISTORY: Oncology History Overview Note  Her 2 neu 1+ MSI stable,  serous, ER 40% Negative genetics Progressed on niraparib, paclitaxel and bevacizumab Allergic to carboplatin Folate receptor alpha neg PD-L1 CPS 1%   Left ovarian epithelial cancer (HCC)  11/09/2018 Imaging   1. 14.7 cm poorly defined soft tissue mass in central pelvis suspicious for primary ovarian carcinoma, with uterine leiomyosarcoma considered a less likely differential diagnosis. 2. Moderate ascites and diffuse peritoneal carcinomatosis. 3. Several small uterine fibroids.   11/13/2018 Tumor Marker   Patient's tumor was tested for the following markers: CA-125 Results of the tumor marker test revealed 1434   11/21/2018 Procedure   Successful ultrasound-guided diagnostic and therapeutic paracentesis yielding 3.1 liters of peritoneal fluid   11/21/2018 Pathology Results   PERITONEAL/ASCITIC FLUID(SPECIMEN 1 OF 1 COLLECTED 11/21/18): ADENOCARCINOMA. Specimen Clinical Information Pelvic mass suspicious for ovarian  cancer Source Peritoneal/Ascitic Fluid, (specimen 1 of 1 collected 11/21/18) Gross Specimen: Received is/are 1000 ccs of dark amber fluid. (CM:cm) Prepared: # Smears: 0 # Concentration Technique Slides (i.e. 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   11/29/2018 Initial Diagnosis   Ovarian cancer (HCC)   11/29/2018 Cancer Staging   Staging form: Ovary, Fallopian Tube, and Primary Peritoneal Carcinoma, AJCC 8th Edition - Clinical: cT3, cN0, cM0 - Signed by Artis Delay, MD on 11/29/2018   12/03/2018 Procedure   Successful ultrasound-guided therapeutic paracentesis yielding 3.7 liters of peritoneal fluid.     12/06/2018 Procedure   Placement of a subcutaneous port device. Catheter tip at the SVC and right atrium junction   12/14/2018 Procedure   Successful ultrasound-guided paracentesis yielding 2.9 L of peritoneal fluid   12/28/2018 Tumor Marker   Patient's tumor was tested for the  following markers: CA-125 Results of the tumor marker test revealed 812.   12/28/2018 - 04/22/2019 Chemotherapy   The patient had carboplatin and taxol for neoadjuvant treatment, followed by interval debulking surgery and subsequent adjuvant chemotherapy treatment.     02/01/2019 Imaging   Ct abdomen and pelvis 8.6 cm left ovarian mass, corresponding to the patient's known primary neoplasm, improved.   Mild peritoneal nodularity/omental caking, improved.   Small abdominopelvic ascites, improved.     02/01/2019 Tumor Marker   Patient's tumor was tested for the following markers: CA-125 Results of the tumor marker test revealed 183.   02/12/2019 Surgery   Preoperative Diagnosis: Stage IIIC ovarian cancer, s/p neoadjuvant chemotherapy      Procedure(s) Performed: 1. Exploratory laparotomy with total abdominal hysterectomy, bilateral salpingo-oophorectomy, omentectomy radical tumor debulking for ovarian cancer.   Surgeon: Luisa Dago, MD.    Operative Findings: upper abdomen free of disease. No visible omental disease. Small volume (200cc) ascites. 8cm friable mass replacing left ovary and adherent to the sigmoid colon mesentery and ureter on the left.  Anterior fibroid. This represented an optimal cytoreduction (R0) with no gross visible disease remaining.    02/12/2019 Pathology Results   A. OVARY AND FALLOPIAN TUBE, LEFT, SALPINGO-OOPHORECTOMY: - Serous carcinoma, high grade, status post neoadjuvant therapy - See oncology table and comment below B. UTERUS CERVIX WITH RIGHT FALLOPIAN TUBE AND OVARY, HYSTERECTOMY: Uterus: - Serosal surface involved by serous carcinoma - Endomyometrium uninvolved by carcinoma - Benign endometrial polyp (4.1 cm) - Leiomyomata (5.5 cm; largest) - Adenomyosis Cervix: - Uninvolved by carcinoma Left ovary: - Serous carcinoma, high grade Left fallopian tube: - Serous carcinoma, high grade C. SOFT TISSUE, LEFT PELVIC SIDEWALL TUMOR, EXCISION: -  Metastatic serous carcinoma, high-grade D. SOFT TISSUE, SIGMOID COLON MESENTERY, EXCISION: - Metastatic serous carcinoma, high-grade E. OMENTUM, TUMOR RESECTION: - Metastatic serous carcinoma, high-grade OVARY or FALLOPIAN TUBE or PRIMARY PERITONEUM: Procedure: Total hysterectomy and bilateral salpingo-oophorectomy, omentectomy and peritoneal biopsies Specimen Integrity: Fragmented Tumor Site: Left ovary and fallopian tube Ovarian Surface Involvement: Present Fallopian Tube Surface Involvement: Present Tumor Size: 6.3 cm (see comment) Histologic Type: Serous carcinoma Histologic Grade: High-grade Implants: Not applicable Other Tissue/ Organ Involvement: Right ovary, right fallopian tube, omentum, mesentery Largest Extrapelvic Peritoneal Focus: Macroscopic Peritoneal/Ascitic Fluid: Malignant Treatment Effect: No definite or minimal response identified (chemotherapy response score 1 [CRS1] Regional Lymph Nodes: No lymph nodes submitted or found Pathologic Stage Classification (pTNM, AJCC 8th Edition): pT3c, pNX Representative Tumor Block: A4 Comment(s): Additional testing (HER-2, MMR and MSI) are pending. The primary tumor site appears  to be the left ovary and fallopian tube. The uterus is only involved on the serosal surface. There is tumor on the anterior peritoneal reflection.  Addendum: Tumor is Her2 negative,MSI stable   02/19/2019 Tumor Marker   Patient's tumor was tested for the following markers: CA-125 Results of the tumor marker test revealed 40.3   04/01/2019 Tumor Marker   Patient's tumor was tested for the following markers: CA-125 Results of the tumor marker test revealed 8.1    Genetic Testing   Negative testing. No pathogenic variants identified on the [employer]. The report date is 04/17/2019.  Somatic genes analyzed through [facility]: ATM, BARD1, BRCA1, BRCA2, BRIP1, CHEK2, MRE11A, NBN, PALB2, RAD51C, RAD51D.  The [facility] gene panel  offered by [employer] includes sequencing and rearrangement analysis for the following 36 genes: APC*, ATM*, AXIN2, BARD1, BMPR1A, BRCA1*, BRCA2*, BRIP1*, CDH1*, CDK4, CDKN2A, CHEK2*, DICER1, MLH1*, MSH2*, MSH3, MSH6*, MUTYH*, NBN, NF1*, NTHL1, PALB2*, PMS2*, PTEN*, RAD51C*, RAD51D*, RECQL, SMAD4, SMARCA4, STK11 and TP53* (sequencing and deletion/duplication); HOXB13, POLD1 and POLE (sequencing only); EPCAM and GREM1 (deletion/duplication only).    05/21/2019 Imaging   1. Interval hysterectomy, oophorectomy and omentectomy. No evidence of residual ovarian carcinoma in the pelvis. No evidence of peritoneal disease. No intraperitoneal free fluid. 2. Nonobstructing LEFT renal calculi.  Normal ureters.   05/22/2019 Tumor Marker   Patient's tumor was tested for the following markers: CA-125 Results of the tumor marker test revealed 5.7.   06/03/2019 Procedure   Successful right IJ vein Port-A-Cath explant.   08/26/2019 Tumor Marker   Patient's tumor was tested for the following markers: CA-125. Results of the tumor marker test revealed 4.9   10/03/2019 Imaging   1. New tumor deposits along the capsular surfaces of the liver, spleen, in the left pelvis, and right paracolic gutter, compatible with metastatic disease. 2. Mild dilation of the dorsal pancreatic duct in the pancreatic body and head, cause uncertain. 3. Nonobstructive left nephrolithiasis. 4. Aortic atherosclerosis.   Aortic Atherosclerosis (ICD10-I70.0   10/10/2019 Procedure   Successful placement of a right IJ approach Power Port with ultrasound and fluoroscopic guidance. The catheter is ready for use.     10/11/2019 Tumor Marker   Patient's tumor was tested for the following markers: CA-125 Results of the tumor marker test revealed 19.6.   10/14/2019 -  Chemotherapy   The patient had carboplatin and gemzar for chemotherapy treatment.     11/04/2019 Tumor Marker   Patient's tumor was tested for the following markers:  CA-125 Results of the tumor marker test revealed 11.4   11/28/2019 Tumor Marker   Patient's tumor was tested for the following markers: CA-125 Results of the tumor marker test revealed 5.8   12/20/2019 Imaging   1. Significant interval reduction in mixed solid and cystic nodule in the vicinity of the left ovary, with significant improvement or resolution of multiple pelvic, peritoneal, and organ capsule implants. Resolution of previously seen small volume ascites. Findings are consistent with treatment response of abdominal metastatic disease. 2. Status post hysterectomy, oophorectomy, and omentectomy. 3. Nonobstructive left nephrolithiasis. 4. Aortic Atherosclerosis (ICD10-I70.0).   12/23/2019 Tumor Marker   Patient's tumor was tested for the following markers: CA-125 Results of the tumor marker test revealed 5.0   01/24/2020 Tumor Marker   Patient's tumor was tested for the following markers: CA-125 Results of the tumor marker test revealed 4.8   02/24/2020 Tumor Marker   Patient's tumor was tested for the following markers: CA-125. Results of the tumor marker  test revealed 4.3.   03/06/2020 Imaging   1. Status post hysterectomy, bilateral oophorectomy, and omentectomy. 2. Resolution of previously described left adnexal nodularity. 3. No residual disease identified. 4.  Aortic Atherosclerosis (ICD10-I70.0). 5. Left nephrolithiasis.   04/20/2020 Tumor Marker   Patient's tumor was tested for the following markers: CA-125 Results of the tumor marker test revealed 4.6   05/18/2020 Tumor Marker   Patient's tumor was tested for the following markers: CA-125 Results of the tumor marker test revealed 4.7   05/29/2020 Imaging   Status post hysterectomy and bilateral salpingo oophorectomy.   No evidence of recurrent or metastatic disease.   06/08/2020 - 09/13/2021 Chemotherapy   She was on niraparib       06/17/2020 Tumor Marker   Patient's tumor was tested for the following markers:  CA-125 Results of the tumor marker test revealed 4.5   08/04/2020 Tumor Marker   Patient's tumor was tested for the following markers: CA-125. Results of the tumor marker test revealed 5.7   09/07/2020 Imaging   1. Status post hysterectomy and bilateral salpingo oophorectomy. No evidence of recurrent or metastatic disease. 2. Nonobstructing left nephrolithiasis. 3. Left-sided colonic diverticulosis without findings of acute diverticulitis. 4. Aortic atherosclerosis.     09/07/2020 Tumor Marker   Patient's tumor was tested for the following markers: CA-125 Results of the tumor marker test revealed 4.8.   10/21/2020 Tumor Marker   Patient's tumor was tested for the following markers: CA-125. Results of the tumor marker test revealed 5.1.   12/14/2020 Tumor Marker   Patient's tumor was tested for the following markers: CA-125. Results of the tumor marker test revealed 5.5.   04/05/2021 Tumor Marker   Patient's tumor was tested for the following markers: CA-125. Results of the tumor marker test revealed 5.9.   05/31/2021 Tumor Marker   Patient's tumor was tested for the following markers: CA-125. Results of the tumor marker test revealed 8.1.   09/13/2021 Imaging   1. Interval development of multiple new cystic and solid lesions in the abdomen and pelvis measuring up to 10.3 x 7.2 cm and consistent with peritoneal metastatic disease. 2. Nonobstructing left renal stones. 3. Aortic Atherosclerosis (ICD10-I70.0).     09/14/2021 Tumor Marker   Patient's tumor was tested for the following markers: CA-125. Results of the tumor marker test revealed 28.5.   09/16/2021 Echocardiogram   1. Left ventricular ejection fraction, by estimation, is 60 to 65%. The left ventricle has normal function. The left ventricle has no regional wall motion abnormalities. Left ventricular diastolic parameters are consistent with Grade I diastolic dysfunction (impaired relaxation).  2. Right ventricular systolic  function is normal. The right ventricular size is normal.  3. The mitral valve is normal in structure. Trivial mitral valve regurgitation. No evidence of mitral stenosis.  4. The aortic valve has an indeterminant number of cusps. Aortic valve regurgitation is trivial. No aortic stenosis is present.  5. The inferior vena cava is normal in size with greater than 50% respiratory variability, suggesting right atrial pressure of 3 mmHg.     09/20/2021 - 01/17/2022 Chemotherapy   Patient is on Treatment Plan : OVARIAN RECURRENT Liposomal Doxorubicin + Carboplatin q28d X 6 Cycles     09/20/2021 - 02/22/2022 Chemotherapy   Patient is on Treatment Plan : OVARIAN RECURRENT Liposomal Doxorubicin + Carboplatin q28d X 6 Cycles     09/23/2021 Tumor Marker   Patient's tumor was tested for the following markers: CA-125. Results of the tumor marker test revealed  27.7.   11/24/2021 Tumor Marker   Patient's tumor was tested for the following markers: CA-125. Results of the tumor marker test revealed 28.4.   12/13/2021 Imaging   1. Overall stable peritoneal carcinomatosis. No new disease is demonstrated. 2. Moderate left-sided hydroureteronephrosis likely due to compression of the left ureter by the left pelvic mass. 3. Stable left-sided renal calculi. 4. Numerous left renal calculi.     12/22/2021 Tumor Marker   Patient's tumor was tested for the following markers: CA-125. Results of the tumor marker test revealed 24.7.   01/18/2022 Tumor Marker   Patient's tumor was tested for the following markers: CA-125. Results of the tumor marker test revealed 9.9.   01/27/2022 Echocardiogram    1. Left ventricular ejection fraction, by estimation, is 60 to 65%. The left ventricle has normal function. The left ventricle has no regional wall motion abnormalities. Left ventricular diastolic parameters are consistent with Grade I diastolic dysfunction (impaired relaxation). The average left ventricular global longitudinal strain  is -21.1 %. The global longitudinal strain is normal.  2. Right ventricular systolic function is normal. The right ventricular size is normal.  3. The mitral valve is abnormal. Mild mitral valve regurgitation.  4. The aortic valve is tricuspid. Aortic valve regurgitation is trivial.  5. The inferior vena cava is normal in size with greater than 50% respiratory variability, suggesting right atrial pressure of 3 mmHg.   02/23/2022 Tumor Marker   Patient's tumor was tested for the following markers: CA-125. Results of the tumor marker test revealed 8.5.   03/18/2022 Imaging   1. Diminished size of nodular and mixed solid and cystic peritoneal implants in the hepatorenal fossa, right lower quadrant, and left pelvis. 2. Unchanged solid mass in the right pelvis measuring 4.0 x 3.7 cm. 3. Constellation of findings is overall consistent with treatment response 4. Status post hysterectomy, oophorectomy, and omentectomy. 5. Nonobstructive left nephrolithiasis.   Aortic Atherosclerosis (ICD10-I70.0).     03/22/2022 - 08/16/2022 Chemotherapy   Patient is on Treatment Plan : Ovarian Bevacizumab q21d     03/24/2022 Tumor Marker   Patient's tumor was tested for the following markers: CA-125. Results of the tumor marker test revealed 7.1.   04/28/2022 Tumor Marker   Patient's tumor was tested for the following markers: CA-125. Results of the tumor marker test revealed 7.0.   05/27/2022 Tumor Marker   Patient's tumor was tested for the following markers: CA-125. Results of the tumor marker test revealed 7.7.   06/14/2022 Imaging   1. Slight response to therapy as evidenced by decrease in size of a cystic and solid mass in the left adnexa as well as peritoneal implants in the right paracolic gutter. Solid right adnexal mass and additional peritoneal implants along right hepatic lobe and in the left anatomic pelvis appear stable. 2. Hepatic steatosis. 3. Left renal stones. 4.  Aortic atherosclerosis  (ICD10-I70.0).   06/17/2022 Tumor Marker   Patient's tumor was tested for the following markers: CA-125. Results of the tumor marker test revealed 11.2.   09/05/2022 Imaging   CT ABDOMEN PELVIS W CONTRAST  Result Date: 09/06/2022 CLINICAL DATA:  Ovarian cancer.  Restaging.  * Tracking Code: BO * EXAM: CT ABDOMEN AND PELVIS WITH CONTRAST TECHNIQUE: Multidetector CT imaging of the abdomen and pelvis was performed using the standard protocol following bolus administration of intravenous contrast. RADIATION DOSE REDUCTION: This exam was performed according to the departmental dose-optimization program which includes automated exposure control, adjustment of the mA and/or kV according  to patient size and/or use of iterative reconstruction technique. CONTRAST:  OMNIPAQUE IOHEXOL 300 MG/ML  SOLN COMPARISON:  06/13/2022 FINDINGS: Lower chest: Unremarkable. Hepatobiliary: No suspicious focal abnormality within the liver parenchyma. There is no evidence for gallstones, gallbladder wall thickening, or pericholecystic fluid. No intrahepatic or extrahepatic biliary dilation. Pancreas: No pancreatic mass lesion evident. Diffuse prominence of the main pancreatic duct is stable measuring up to 3-4 mm diameter in the head of the pancreas. Spleen: No splenomegaly. No focal mass lesion. Adrenals/Urinary Tract: No adrenal nodule or mass. Right kidney unremarkable. A cluster of nonobstructing stones in the upper pole right kidney is again noted with dominant stone measuring about 6 mm in size. No evidence for hydroureter. The urinary bladder appears normal for the degree of distention. Stomach/Bowel: Tiny hiatal hernia. Stomach otherwise unremarkable. Duodenum is normally positioned as is the ligament of Treitz. No small bowel wall thickening. No small bowel dilatation. The terminal ileum is normal. The appendix is normal. No gross colonic mass. No colonic wall thickening. Diverticular changes are noted in the left colon  without evidence of diverticulitis. Vascular/Lymphatic: There is mild atherosclerotic calcification of the abdominal aorta without aneurysm. There is no gastrohepatic or hepatoduodenal ligament lymphadenopathy. No retroperitoneal or mesenteric lymphadenopathy. No pelvic sidewall lymphadenopathy. Reproductive: Right-sided adnexal mass measured previously at 4.2 x 3.8 cm is now 4.9 x 4.6 cm. Cystic and solid left adnexal lesion measured previously at 4.1 x 3.4 cm is now 3.2 x 2.9 cm. 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 The more anterior of the 2 index lesions measures 2.6 x 2.1 cm today compared to 2.4 x 1.6 cm previously. The more posterior lesion is 2.4 x 1.4 cm today compared to 2.0 x 1.9 cm previously. 5 mm soft tissue nodule identified in the inferior paracolic gutter previously at 5 mm measures 10 mm today on image 51/2. Implants along the left posterior peritoneum adjacent to the psoas muscle and iliac vessels appear progressive. 1.6 x 1.3 cm lesion visible on 58/2 was 1.1 x 0.8 cm previously (remeasured). No evidence for ascites. Musculoskeletal: No worrisome lytic or sclerotic osseous abnormality. IMPRESSION: 1. Overall generalized appearance of mild disease progression. 2. Index right-sided adnexal mass has progressed since the prior with interval decrease in size of the cystic and solid left adnexal lesion. 3. Interval progression of index capsular implants along the medial right liver. 4. Interval progression of soft tissue nodule in the inferior right paracolic gutter and peritoneal implants in the lower left abdomen and pelvis. 5. No evidence for ascites. 6. Nonobstructing left renal stones. 7. Tiny hiatal hernia. 8. Left colonic diverticulosis without diverticulitis. 9.  Aortic Atherosclerosis (ICD10-I70.0). Electronically Signed   By: Kennith Center M.D.   On: 09/06/2022 07:57      09/15/2022 - 03/31/2023 Chemotherapy   Patient is on Treatment Plan : OVARIAN Paclitaxel  (175) q21d X 6 Cycles     11/16/2022 Imaging   CT ABDOMEN PELVIS W CONTRAST  Result Date: 11/16/2022 CLINICAL DATA:  Ovarian carcinoma. Assess treatment response. * Tracking Code: BO * EXAM: CT ABDOMEN AND PELVIS WITH CONTRAST TECHNIQUE: Multidetector CT imaging of the abdomen and pelvis was performed using the standard protocol following bolus administration of intravenous contrast. RADIATION DOSE REDUCTION: This exam was performed according to the departmental dose-optimization program which includes automated exposure control, adjustment of the mA and/or kV according to patient size and/or use of iterative reconstruction technique. CONTRAST:  OMNIPAQUE IOHEXOL 300 MG/ML  SOLN COMPARISON:  None Available. FINDINGS: Lower chest: Lung bases are clear. Hepatobiliary: No focal hepatic lesion. Normal gallbladder. No biliary duct dilatation. Common bile duct is normal. Peritoneal implant described along the margin the RIGHT hepatic liver is much less conspicuous. Capsular lesion measuring 12 mm on image 14/2 is decreased from 26 mm. Pancreas: Pancreas is normal. No ductal dilatation. No pancreatic inflammation. Spleen: Normal spleen Adrenals/urinary tract: Adrenal glands normal. Multiple calculi within LEFT kidney without obstruction. Ureters and bladder normal. Stomach/Bowel: Stomach, small bowel, appendix, and cecum are normal. Multiple diverticula of the descending colon and sigmoid colon without acute inflammation. Vascular/Lymphatic: Abdominal aorta is normal caliber. No periportal or retroperitoneal adenopathy. No pelvic adenopathy. Reproductive: Solid enhancing mass in the deep RIGHT pelvis adjacent the rectum measures 4.0 x 4.3 cm compared to 4.6 x 4.9 cm. Small adjacent satellite nodule measuring 1.1 cm (image 63) is unchanged. Cystic lesion of the LEFT adnexa with mural nodularity measures 3.0 x 2.4 cm compared to 3.2 x 2.9 cm. No new nodularity in the pelvis. Post hysterectomy. Other: Interval  decrease in the peritoneal nodularity. For example peritoneal nodule adjacent to the cecum in the RIGHT iliac fossa measures 6 mm (image 43/2 compared to 10 mm on prior. Lesions on the LEFT iliac vessels less prominent. For example 7 mm lesion on 50/2 decreased from 16 mm. Musculoskeletal: No aggressive osseous lesion. IMPRESSION: 1. Interval decrease in size of RIGHT pelvic mass. 2. Interval decrease in size of LEFT adnexal cystic mass. 3. Interval decrease in size of peritoneal nodularity. 4. Interval decrease in size of perihepatic peritoneal implant. 5. No new metastatic disease. Electronically Signed   By: Genevive Bi M.D.   On: 11/16/2022 14:34      02/08/2023 Imaging   CT ABDOMEN PELVIS W CONTRAST  Result Date: 02/08/2023 CLINICAL DATA:  Ovarian carcinoma.  Assess treatment response. EXAM: CT ABDOMEN AND PELVIS WITH CONTRAST TECHNIQUE: Multidetector CT imaging of the abdomen and pelvis was performed using the standard protocol following bolus administration of intravenous contrast. RADIATION DOSE REDUCTION: This exam was performed according to the departmental dose-optimization program which includes automated exposure control, adjustment of the mA and/or kV according to patient size and/or use of iterative reconstruction technique. CONTRAST:  OMNIPAQUE IOHEXOL 300 MG/ML  SOLN COMPARISON:  CT 11/14/2022 FINDINGS: Lower chest: Lung bases are clear. Hepatobiliary: No focal hepatic lesion. Normal gallbladder. No biliary duct dilatation. Common bile duct is normal. Small peritoneal implant along the RIGHT hepatic lobe is less conspicuous at 6 mm (image 18/2) compared to 11 mm on prior. Pancreas: Pancreas is normal. No ductal dilatation. No pancreatic inflammation. Spleen: Normal spleen Adrenals/urinary tract: Adrenal glands normal. Multiple nonobstructing calculi in the upper pole of the LEFT kidney. Ureters and bladder normal. Stomach/Bowel: Stomach, small bowel, appendix, and cecum are normal.  The colon and rectosigmoid colon are normal. Vascular/Lymphatic: Abdominal aorta is normal caliber with atherosclerotic calcification. There is no retroperitoneal or periportal lymphadenopathy. No pelvic lymphadenopathy. Reproductive: Post hysterectomy anatomy. Again demonstrated rounded mass posterior to the vaginal cuff just RIGHT of midline measures 3.7 by 3.0 cm (image 69/series 2) compared to 4.3 x 4.0 cm on most recent CT. Part cystic and solid lesion in the LEFT adnexa measures 2.2 x 1.6 cm (image 65) compared to 3.0 x 2.2 cm. Previously measured peritoneal implant in the deep RIGHT pelvis is no longer identified. No new mass lesions within the pelvis. Other: No new peritoneal disease.  No free fluid. Musculoskeletal: No aggressive osseous lesion. IMPRESSION: 1. Interval  decrease in size rounded mass in the deep RIGHT pelvis. 2. Interval decrease in size of cystic LEFT adnexal lesion. 3. Decrease in size of peritoneal implants. 4. No evidence of new metastatic disease. No intraperitoneal free fluid Electronically Signed   By: Genevive Bi M.D.   On: 02/08/2023 10:56      04/10/2023 Imaging   CT ABDOMEN PELVIS W CONTRAST  Result Date: 04/13/2023 CLINICAL DATA:  Ovarian cancer.  Restaging.  * Tracking Code: BO * EXAM: CT ABDOMEN AND PELVIS WITH CONTRAST TECHNIQUE: Multidetector CT imaging of the abdomen and pelvis was performed using the standard protocol following bolus administration of intravenous contrast. RADIATION DOSE REDUCTION: This exam was performed according to the departmental dose-optimization program which includes automated exposure control, adjustment of the mA and/or kV according to patient size and/or use of iterative reconstruction technique. CONTRAST:  OMNIPAQUE IOHEXOL 300 MG/ML  SOLN COMPARISON:  01/31/2023 FINDINGS: Lower chest: No acute findings. Hepatobiliary: No suspicious focal abnormality within the liver parenchyma. There is no evidence for gallstones, gallbladder  wall thickening, or pericholecystic fluid. No intrahepatic or extrahepatic biliary dilation. Pancreas: No focal mass lesion. No dilatation of the main duct. No intraparenchymal cyst. No peripancreatic edema. Spleen: No splenomegaly. No suspicious focal mass lesion. Adrenals/Urinary Tract: No adrenal nodule or mass. Right kidney unremarkable. Clustered nonobstructing stones are seen in the upper pole left kidney measuring up to 7 mm diameter. No evidence for hydroureter. The urinary bladder appears normal for the degree of distention. Stomach/Bowel: Stomach is unremarkable. No gastric wall thickening. No evidence of outlet obstruction. Duodenum is normally positioned as is the ligament of Treitz. No small bowel wall thickening. No small bowel dilatation. The terminal ileum is normal. The appendix is normal. No gross colonic mass. No colonic wall thickening. Diverticular changes are noted in the left colon without evidence of diverticulitis. Vascular/Lymphatic: There is mild atherosclerotic calcification of the abdominal aorta without aneurysm. There is no gastrohepatic or hepatoduodenal ligament lymphadenopathy. No retroperitoneal or mesenteric lymphadenopathy. No pelvic sidewall lymphadenopathy. Reproductive: Uterus surgically absent. Right paramidline pelvic mass lesion again evident. This is just posterior to the right vaginal cuff measuring 4.2 x 4.2 cm on image 68/2 today which compares to 3.7 x 3.1 cm previously. Cystic lesion in the left adnexal space is also progressive measuring 2.5 x 1.9 cm today compared to 2.2 x 1.6 cm previously. There is some enhancing soft tissue along the antro inferior aspect of this lesion (see image 65/2). No new mesenteric or peritoneal lesion evident. Other: No intraperitoneal free fluid. Musculoskeletal: No worrisome lytic or sclerotic osseous abnormality. IMPRESSION: 1. Interval progression of the right pelvic lesion along the vaginal cuff measuring 4.2 x 4.2 cm today compared  to 3.7 x 3.1 cm previously. Cystic lesion in the left adnexal space is also progressive measuring 2.5 x 1.9 cm today compared to 2.2 x 1.6 cm previously. 2. No new mesenteric or peritoneal lesion evident. 3. Clustered nonobstructing stones in the upper pole left kidney measuring up to 7 mm diameter. 4. Left colonic diverticulosis without diverticulitis. 5.  Aortic Atherosclerosis (ICD10-I70.0). Electronically Signed   By: Kennith Center M.D.   On: 04/13/2023 14:04   DG Chest 2 View  Result Date: 03/22/2023 CLINICAL DATA:  Cough EXAM: CHEST - 2 VIEW COMPARISON:  None Available. FINDINGS: Right-sided chest port terminates at the level of the distal SVC. The heart size and mediastinal contours are within normal limits. Both lungs are clear. The visualized skeletal structures are unremarkable. IMPRESSION: No  active cardiopulmonary disease. Electronically Signed   By: Duanne Guess D.O.   On: 03/22/2023 16:29       04/26/2023 -  Chemotherapy   Patient is on Treatment Plan : OVARIAN Liposomal Doxorubicin (40) q28d       PHYSICAL EXAMINATION: ECOG PERFORMANCE STATUS: 0 - Asymptomatic  Vitals:   04/14/23 1120  BP: (!) 146/84  Pulse: 81  Resp: 18   Filed Weights   04/14/23 1120  Weight: 146 lb 6.4 oz (66.4 kg)    GENERAL:alert, no distress and comfortable  LABORATORY DATA:  I have reviewed the data as listed    Component Value Date/Time   NA 140 03/31/2023 1013   K 3.9 03/31/2023 1013   CL 105 03/31/2023 1013   CO2 27 03/31/2023 1013   GLUCOSE 148 (H) 03/31/2023 1013   BUN 16 03/31/2023 1013   CREATININE 0.55 03/31/2023 1013   CALCIUM 9.9 03/31/2023 1013   PROT 6.8 03/31/2023 1013   ALBUMIN 4.4 03/31/2023 1013   AST 29 03/31/2023 1013   ALT 33 03/31/2023 1013   ALKPHOS 102 03/31/2023 1013   BILITOT 0.7 03/31/2023 1013   GFRNONAA >60 03/31/2023 1013   GFRAA >60 02/24/2020 0852    No results found for: "SPEP", "UPEP"  Lab Results  Component Value Date   WBC 8.1  03/31/2023   NEUTROABS 7.3 03/31/2023   HGB 12.1 03/31/2023   HCT 34.7 (L) 03/31/2023   MCV 96.1 03/31/2023   PLT 223 03/31/2023      Chemistry      Component Value Date/Time   NA 140 03/31/2023 1013   K 3.9 03/31/2023 1013   CL 105 03/31/2023 1013   CO2 27 03/31/2023 1013   BUN 16 03/31/2023 1013   CREATININE 0.55 03/31/2023 1013      Component Value Date/Time   CALCIUM 9.9 03/31/2023 1013   ALKPHOS 102 03/31/2023 1013   AST 29 03/31/2023 1013   ALT 33 03/31/2023 1013   BILITOT 0.7 03/31/2023 1013       RADIOGRAPHIC STUDIES: I have reviewed imaging study with the patient I have personally reviewed the radiological images as listed and agreed with the findings in the report. CT ABDOMEN PELVIS W CONTRAST  Result Date: 04/13/2023 CLINICAL DATA:  Ovarian cancer.  Restaging.  * Tracking Code: BO * EXAM: CT ABDOMEN AND PELVIS WITH CONTRAST TECHNIQUE: Multidetector CT imaging of the abdomen and pelvis was performed using the standard protocol following bolus administration of intravenous contrast. RADIATION DOSE REDUCTION: This exam was performed according to the departmental dose-optimization program which includes automated exposure control, adjustment of the mA and/or kV according to patient size and/or use of iterative reconstruction technique. CONTRAST:  OMNIPAQUE IOHEXOL 300 MG/ML  SOLN COMPARISON:  01/31/2023 FINDINGS: Lower chest: No acute findings. Hepatobiliary: No suspicious focal abnormality within the liver parenchyma. There is no evidence for gallstones, gallbladder wall thickening, or pericholecystic fluid. No intrahepatic or extrahepatic biliary dilation. Pancreas: No focal mass lesion. No dilatation of the main duct. No intraparenchymal cyst. No peripancreatic edema. Spleen: No splenomegaly. No suspicious focal mass lesion. Adrenals/Urinary Tract: No adrenal nodule or mass. Right kidney unremarkable. Clustered nonobstructing stones are seen in the upper pole left  kidney measuring up to 7 mm diameter. No evidence for hydroureter. The urinary bladder appears normal for the degree of distention. Stomach/Bowel: Stomach is unremarkable. No gastric wall thickening. No evidence of outlet obstruction. Duodenum is normally positioned as is the ligament of Treitz. No small bowel wall  thickening. No small bowel dilatation. The terminal ileum is normal. The appendix is normal. No gross colonic mass. No colonic wall thickening. Diverticular changes are noted in the left colon without evidence of diverticulitis. Vascular/Lymphatic: There is mild atherosclerotic calcification of the abdominal aorta without aneurysm. There is no gastrohepatic or hepatoduodenal ligament lymphadenopathy. No retroperitoneal or mesenteric lymphadenopathy. No pelvic sidewall lymphadenopathy. Reproductive: Uterus surgically absent. Right paramidline pelvic mass lesion again evident. This is just posterior to the right vaginal cuff measuring 4.2 x 4.2 cm on image 68/2 today which compares to 3.7 x 3.1 cm previously. Cystic lesion in the left adnexal space is also progressive measuring 2.5 x 1.9 cm today compared to 2.2 x 1.6 cm previously. There is some enhancing soft tissue along the antro inferior aspect of this lesion (see image 65/2). No new mesenteric or peritoneal lesion evident. Other: No intraperitoneal free fluid. Musculoskeletal: No worrisome lytic or sclerotic osseous abnormality. IMPRESSION: 1. Interval progression of the right pelvic lesion along the vaginal cuff measuring 4.2 x 4.2 cm today compared to 3.7 x 3.1 cm previously. Cystic lesion in the left adnexal space is also progressive measuring 2.5 x 1.9 cm today compared to 2.2 x 1.6 cm previously. 2. No new mesenteric or peritoneal lesion evident. 3. Clustered nonobstructing stones in the upper pole left kidney measuring up to 7 mm diameter. 4. Left colonic diverticulosis without diverticulitis. 5.  Aortic Atherosclerosis (ICD10-I70.0).  Electronically Signed   By: Kennith Center M.D.   On: 04/13/2023 14:04   DG Chest 2 View  Result Date: 03/22/2023 CLINICAL DATA:  Cough EXAM: CHEST - 2 VIEW COMPARISON:  None Available. FINDINGS: Right-sided chest port terminates at the level of the distal SVC. The heart size and mediastinal contours are within normal limits. Both lungs are clear. The visualized skeletal structures are unremarkable. IMPRESSION: No active cardiopulmonary disease. Electronically Signed   By: Duanne Guess D.O.   On: 03/22/2023 16:29

## 2023-04-14 NOTE — Assessment & Plan Note (Signed)
I have reviewed multiple imaging studies with the patient Unfortunately, she has disease progression on paclitaxel We would discontinue this Previously, the patient did not progress on liposomal doxorubicin or gemcitabine Other options including docetaxel and topotecan were also discussed Ultimately, after long discussion, she is in agreement to proceed with liposomal doxorubicin We will get pretreatment echocardiogram and start her treatment first week of December I will plan to repeat imaging study after 3 cycles of therapy

## 2023-04-14 NOTE — Progress Notes (Signed)
DISCONTINUE ON PATHWAY REGIMEN - Ovarian     A cycle is every 21 days:     Paclitaxel      Carboplatin   **Always confirm dose/schedule in your pharmacy ordering system**  REASON: Disease Progression PRIOR TREATMENT: [id number]: Carboplatin AUC=6 + Paclitaxel 175 mg/m2 q21 Days; Re-evaluate Every 3 Cycles, Treat until Complete Response, Unacceptable Toxicity, or Disease Progression TREATMENT RESPONSE: Progressive Disease (PD)  START ON PATHWAY REGIMEN - Ovarian     A cycle is every 28 days:     Bevacizumab-xxxx      Liposomal doxorubicin   **Always confirm dose/schedule in your pharmacy ordering system**  Patient Characteristics: Recurrent or Progressive Disease, Third Line, Platinum Resistant or < 6 Months Since Last Platinum Therapy, Low, Medium, or Unknown Folate Receptor Alpha Expression OR  Prior Mirvetuximab OR Not a Candidate for Mirvetuximab, HER2 Expression  Equivocal/Negative/Unknown (IHC ? 2+) BRCA Mutation Status: Absent Therapeutic Status: Recurrent or Progressive Disease Line of Therapy: Third Line Folate Receptor Alpha (FR?) Expression: Low Mirvetuximab Candidacy: Not a Candidate for Mirvetuximab OR Prior Mirvetuximab HER2 Expression Status by IHC: Negative (IHC 0, 1+) Intent of Therapy: Non-Curative / Palliative Intent, Discussed with Patient

## 2023-04-15 ENCOUNTER — Other Ambulatory Visit: Payer: Self-pay

## 2023-04-18 ENCOUNTER — Ambulatory Visit (HOSPITAL_COMMUNITY)
Admission: RE | Admit: 2023-04-18 | Discharge: 2023-04-18 | Discharge status: Home / Self Care | Payer: Medicare Other | Source: Ambulatory Visit | Attending: Hematology and Oncology

## 2023-04-18 DIAGNOSIS — I158 Other secondary hypertension: Secondary | ICD-10-CM | POA: Diagnosis not present

## 2023-04-18 DIAGNOSIS — C562 Malignant neoplasm of left ovary: Secondary | ICD-10-CM | POA: Diagnosis present

## 2023-04-18 DIAGNOSIS — T50905A Adverse effect of unspecified drugs, medicaments and biological substances, initial encounter: Secondary | ICD-10-CM | POA: Diagnosis not present

## 2023-04-18 DIAGNOSIS — Z0189 Encounter for other specified special examinations: Secondary | ICD-10-CM

## 2023-04-18 LAB — ECHOCARDIOGRAM COMPLETE
AR max vel: 2.07 cm2
AV Area VTI: 2.33 cm2
AV Area mean vel: 2.18 cm2
AV Mean grad: 4 mm[Hg]
AV Peak grad: 6.6 mm[Hg]
Ao pk vel: 1.28 m/s
Area-P 1/2: 4.26 cm2
P 1/2 time: 599 ms
S' Lateral: 2.5 cm

## 2023-04-18 NOTE — Progress Notes (Signed)
Pharmacist Chemotherapy Monitoring - Initial Assessment    Anticipated start date: 04/26/23   The following has been reviewed per standard work regarding the patient's treatment regimen: The patient's diagnosis, treatment plan and drug doses, and organ/hematologic function Lab orders and baseline tests specific to treatment regimen  The treatment plan start date, drug sequencing, and pre-medications Prior authorization status  Patient's documented medication list, including drug-drug interaction screen and prescriptions for anti-emetics and supportive care specific to the treatment regimen The drug concentrations, fluid compatibility, administration routes, and timing of the medications to be used The patient's access for treatment and lifetime cumulative dose history, if applicable  The patient's medication allergies and previous infusion related reactions, if applicable   Changes made to treatment plan:  N/A  Follow up needed:  Pending authorization for treatment  F/u ECHO results    Stacy Rivas, RPH, 04/18/2023  1:01 PM

## 2023-04-19 ENCOUNTER — Other Ambulatory Visit: Payer: Self-pay | Admitting: Hematology and Oncology

## 2023-04-26 ENCOUNTER — Inpatient Hospital Stay: Payer: Medicare Other

## 2023-04-26 ENCOUNTER — Inpatient Hospital Stay: Payer: Medicare Other | Attending: Gynecologic Oncology

## 2023-04-26 VITALS — BP 149/78 | HR 77 | Temp 98.3°F | Resp 16 | Wt 146.8 lb

## 2023-04-26 DIAGNOSIS — C786 Secondary malignant neoplasm of retroperitoneum and peritoneum: Secondary | ICD-10-CM | POA: Insufficient documentation

## 2023-04-26 DIAGNOSIS — C7989 Secondary malignant neoplasm of other specified sites: Secondary | ICD-10-CM | POA: Diagnosis not present

## 2023-04-26 DIAGNOSIS — Z7189 Other specified counseling: Secondary | ICD-10-CM

## 2023-04-26 DIAGNOSIS — C562 Malignant neoplasm of left ovary: Secondary | ICD-10-CM

## 2023-04-26 DIAGNOSIS — Z5111 Encounter for antineoplastic chemotherapy: Secondary | ICD-10-CM | POA: Insufficient documentation

## 2023-04-26 LAB — CBC WITH DIFFERENTIAL (CANCER CENTER ONLY)
Abs Immature Granulocytes: 0.02 10*3/uL (ref 0.00–0.07)
Basophils Absolute: 0 10*3/uL (ref 0.0–0.1)
Basophils Relative: 0 %
Eosinophils Absolute: 0.1 10*3/uL (ref 0.0–0.5)
Eosinophils Relative: 1 %
HCT: 36.9 % (ref 36.0–46.0)
Hemoglobin: 12 g/dL (ref 12.0–15.0)
Immature Granulocytes: 0 %
Lymphocytes Relative: 22 %
Lymphs Abs: 1.2 10*3/uL (ref 0.7–4.0)
MCH: 31.7 pg (ref 26.0–34.0)
MCHC: 32.5 g/dL (ref 30.0–36.0)
MCV: 97.6 fL (ref 80.0–100.0)
Monocytes Absolute: 0.5 10*3/uL (ref 0.1–1.0)
Monocytes Relative: 8 %
Neutro Abs: 3.9 10*3/uL (ref 1.7–7.7)
Neutrophils Relative %: 69 %
Platelet Count: 193 10*3/uL (ref 150–400)
RBC: 3.78 MIL/uL — ABNORMAL LOW (ref 3.87–5.11)
RDW: 15 % (ref 11.5–15.5)
WBC Count: 5.7 10*3/uL (ref 4.0–10.5)
nRBC: 0 % (ref 0.0–0.2)

## 2023-04-26 LAB — CMP (CANCER CENTER ONLY)
ALT: 31 U/L (ref 0–44)
AST: 34 U/L (ref 15–41)
Albumin: 4 g/dL (ref 3.5–5.0)
Alkaline Phosphatase: 77 U/L (ref 38–126)
Anion gap: 6 (ref 5–15)
BUN: 13 mg/dL (ref 8–23)
CO2: 28 mmol/L (ref 22–32)
Calcium: 9.1 mg/dL (ref 8.9–10.3)
Chloride: 108 mmol/L (ref 98–111)
Creatinine: 0.49 mg/dL (ref 0.44–1.00)
GFR, Estimated: >60 mL/min (ref >60)
Glucose, Bld: 106 mg/dL — ABNORMAL HIGH (ref 70–99)
Potassium: 3.6 mmol/L (ref 3.5–5.1)
Sodium: 142 mmol/L (ref 135–145)
Total Bilirubin: 1.1 mg/dL (ref <1.2)
Total Protein: 6 g/dL — ABNORMAL LOW (ref 6.5–8.1)

## 2023-04-26 MED ORDER — DOXORUBICIN HCL LIPOSOMAL CHEMO INJECTION 2 MG/ML
41.0000 mg/m2 | Freq: Once | INTRAVENOUS | Status: AC
  Administered 2023-04-26: 70 mg via INTRAVENOUS
  Filled 2023-04-26: qty 25

## 2023-04-26 MED ORDER — HEPARIN SOD (PORK) LOCK FLUSH 100 UNIT/ML IV SOLN
500.0000 [IU] | Freq: Once | INTRAVENOUS | Status: AC | PRN
Start: 2023-04-26 — End: 2023-04-26
  Administered 2023-04-26: 500 [IU]

## 2023-04-26 MED ORDER — SODIUM CHLORIDE 0.9% FLUSH
10.0000 mL | INTRAVENOUS | Status: DC | PRN
  Administered 2023-04-26: 10 mL

## 2023-04-26 MED ORDER — SODIUM CHLORIDE 0.9% FLUSH
10.0000 mL | Freq: Once | INTRAVENOUS | Status: AC
Start: 2023-04-26 — End: 2023-04-26
  Administered 2023-04-26: 10 mL

## 2023-04-26 MED ORDER — DEXAMETHASONE SODIUM PHOSPHATE 10 MG/ML IJ SOLN
10.0000 mg | Freq: Once | INTRAMUSCULAR | Status: AC
  Administered 2023-04-26: 10 mg via INTRAVENOUS
  Filled 2023-04-26: qty 1

## 2023-04-26 MED ORDER — DEXTROSE 5 % IV SOLN: INTRAVENOUS | Status: DC

## 2023-04-26 NOTE — Patient Instructions (Signed)
[facility] - A DEPT OF [facility]  Discharge Instructions: Thank you for choosing [facility] to provide your oncology and hematology care.   If you have a lab appointment with the Cancer Center, please go directly to the Cancer Center and check in at the registration area.   Wear comfortable clothing and clothing appropriate for easy access to any Portacath or PICC line.   We strive to give you quality time with your provider. You may need to reschedule your appointment if you arrive late (15 or more minutes).  Arriving late affects you and other patients whose appointments are after yours.  Also, if you miss three or more appointments without notifying the office, you may be dismissed from the clinic at the provider's discretion.      For prescription refill requests, have your pharmacy contact our office and allow 72 hours for refills to be completed.    Today you received the following chemotherapy and/or immunotherapy agents: Doxil      To help prevent nausea and vomiting after your treatment, we encourage you to take your nausea medication as directed.  BELOW ARE SYMPTOMS THAT SHOULD BE REPORTED IMMEDIATELY: *FEVER GREATER THAN 100.4 F (38 C) OR HIGHER *CHILLS OR SWEATING *NAUSEA AND VOMITING THAT IS NOT CONTROLLED WITH YOUR NAUSEA MEDICATION *UNUSUAL SHORTNESS OF BREATH *UNUSUAL BRUISING OR BLEEDING *URINARY PROBLEMS (pain or burning when urinating, or frequent urination) *BOWEL PROBLEMS (unusual diarrhea, constipation, pain near the anus) TENDERNESS IN MOUTH AND THROAT WITH OR WITHOUT PRESENCE OF ULCERS (sore throat, sores in mouth, or a toothache) UNUSUAL RASH, SWELLING OR PAIN  UNUSUAL VAGINAL DISCHARGE OR ITCHING   Items with * indicate a potential emergency and should be followed up as soon as possible or go to the Emergency Department if any problems should occur.  Please show the CHEMOTHERAPY ALERT CARD or IMMUNOTHERAPY  ALERT CARD at check-in to the Emergency Department and triage nurse.  Should you have questions after your visit or need to cancel or reschedule your appointment, please contact [facility] - A DEPT OF [facility]  Dept: [phone]  and follow the prompts.  Office hours are 8:00 a.m. to 4:30 p.m. Monday - Friday. Please note that voicemails left after 4:00 p.m. may not be returned until the following business day.  We are closed weekends and major holidays. You have access to a nurse at all times for urgent questions. Please call the main number to the clinic Dept: [phone] and follow the prompts.   For any non-urgent questions, you may also contact your provider using MyChart. We now offer e-Visits for anyone 42 and older to request care online for non-urgent symptoms. For details visit [email or web address].   Also download the MyChart app! Go to the app store, search "MyChart", open the app, select [facility], and log in with your MyChart username and password.

## 2023-04-27 ENCOUNTER — Telehealth: Payer: Self-pay

## 2023-04-27 NOTE — Telephone Encounter (Signed)
Stacy Rivas states that she is doing pretty well. She is eating, drinking, and urinating well. She has had some nausea and fatigue.  She drank some tea which settled her stomach. She has not needed to her her nausea meds. She knows to call the office at [phone] if  she has any questions or concerns.

## 2023-04-27 NOTE — Telephone Encounter (Signed)
-----   Message from Nurse Reece Levy sent at 04/26/2023 12:18 PM EST ----- Regarding: Dr. [name] 1st time Doxil, Pt call back due Dr. [name] 1st time Doxil (last dose in 2023), Pt tolerated tx well without incident. Please make sure Pt is rinsing mouth with salt water and baking soda. :)

## 2023-05-01 ENCOUNTER — Other Ambulatory Visit: Payer: Self-pay

## 2023-05-10 ENCOUNTER — Other Ambulatory Visit: Payer: Self-pay | Admitting: Internal Medicine

## 2023-05-10 DIAGNOSIS — Z1231 Encounter for screening mammogram for malignant neoplasm of breast: Secondary | ICD-10-CM

## 2023-05-11 ENCOUNTER — Ambulatory Visit
Admission: RE | Admit: 2023-05-11 | Discharge: 2023-05-11 | Disposition: A | Discharge status: Home / Self Care | Payer: Medicare Other | Source: Ambulatory Visit | Attending: Internal Medicine | Admitting: Internal Medicine

## 2023-05-11 ENCOUNTER — Encounter: Payer: Self-pay | Admitting: Hematology and Oncology

## 2023-05-11 ENCOUNTER — Ambulatory Visit: Payer: Medicare Other

## 2023-05-11 DIAGNOSIS — Z1231 Encounter for screening mammogram for malignant neoplasm of breast: Secondary | ICD-10-CM

## 2023-05-12 ENCOUNTER — Other Ambulatory Visit: Payer: Self-pay

## 2023-05-25 ENCOUNTER — Inpatient Hospital Stay: Payer: Medicare Other | Admitting: Licensed Clinical Social Worker

## 2023-05-25 ENCOUNTER — Encounter: Payer: Self-pay | Admitting: Hematology and Oncology

## 2023-05-25 ENCOUNTER — Inpatient Hospital Stay (HOSPITAL_BASED_OUTPATIENT_CLINIC_OR_DEPARTMENT_OTHER): Payer: Medicare Other | Admitting: Hematology and Oncology

## 2023-05-25 ENCOUNTER — Other Ambulatory Visit (HOSPITAL_BASED_OUTPATIENT_CLINIC_OR_DEPARTMENT_OTHER): Payer: Self-pay

## 2023-05-25 ENCOUNTER — Inpatient Hospital Stay: Payer: Medicare Other

## 2023-05-25 ENCOUNTER — Inpatient Hospital Stay: Payer: Medicare Other | Attending: Gynecologic Oncology

## 2023-05-25 ENCOUNTER — Other Ambulatory Visit (HOSPITAL_COMMUNITY): Payer: Self-pay

## 2023-05-25 VITALS — BP 136/75 | HR 89 | Temp 97.6°F | Resp 18 | Ht 62.0 in | Wt 146.6 lb

## 2023-05-25 VITALS — BP 145/76 | HR 71 | Temp 97.8°F | Resp 20

## 2023-05-25 DIAGNOSIS — C786 Secondary malignant neoplasm of retroperitoneum and peritoneum: Secondary | ICD-10-CM | POA: Insufficient documentation

## 2023-05-25 DIAGNOSIS — C562 Malignant neoplasm of left ovary: Secondary | ICD-10-CM | POA: Insufficient documentation

## 2023-05-25 DIAGNOSIS — L271 Localized skin eruption due to drugs and medicaments taken internally: Secondary | ICD-10-CM

## 2023-05-25 DIAGNOSIS — Z5111 Encounter for antineoplastic chemotherapy: Secondary | ICD-10-CM | POA: Insufficient documentation

## 2023-05-25 DIAGNOSIS — Z7189 Other specified counseling: Secondary | ICD-10-CM

## 2023-05-25 DIAGNOSIS — D701 Agranulocytosis secondary to cancer chemotherapy: Secondary | ICD-10-CM | POA: Diagnosis not present

## 2023-05-25 DIAGNOSIS — T451X5A Adverse effect of antineoplastic and immunosuppressive drugs, initial encounter: Secondary | ICD-10-CM | POA: Diagnosis not present

## 2023-05-25 LAB — CMP (CANCER CENTER ONLY)
ALT: 23 U/L (ref 0–44)
AST: 25 U/L (ref 15–41)
Albumin: 4.2 g/dL (ref 3.5–5.0)
Alkaline Phosphatase: 74 U/L (ref 38–126)
Anion gap: 6 (ref 5–15)
BUN: 13 mg/dL (ref 8–23)
CO2: 28 mmol/L (ref 22–32)
Calcium: 9.5 mg/dL (ref 8.9–10.3)
Chloride: 107 mmol/L (ref 98–111)
Creatinine: 0.54 mg/dL (ref 0.44–1.00)
GFR, Estimated: >60 mL/min (ref >60)
Glucose, Bld: 95 mg/dL (ref 70–99)
Potassium: 3.7 mmol/L (ref 3.5–5.1)
Sodium: 141 mmol/L (ref 135–145)
Total Bilirubin: 1.2 mg/dL (ref 0.0–1.2)
Total Protein: 6.5 g/dL (ref 6.5–8.1)

## 2023-05-25 LAB — CBC WITH DIFFERENTIAL (CANCER CENTER ONLY)
Abs Immature Granulocytes: 0.01 10*3/uL (ref 0.00–0.07)
Basophils Absolute: 0 10*3/uL (ref 0.0–0.1)
Basophils Relative: 1 %
Eosinophils Absolute: 0 10*3/uL (ref 0.0–0.5)
Eosinophils Relative: 1 %
HCT: 36 % (ref 36.0–46.0)
Hemoglobin: 12.2 g/dL (ref 12.0–15.0)
Immature Granulocytes: 0 %
Lymphocytes Relative: 27 %
Lymphs Abs: 1.1 10*3/uL (ref 0.7–4.0)
MCH: 32.5 pg (ref 26.0–34.0)
MCHC: 33.9 g/dL (ref 30.0–36.0)
MCV: 96 fL (ref 80.0–100.0)
Monocytes Absolute: 0.7 10*3/uL (ref 0.1–1.0)
Monocytes Relative: 17 %
Neutro Abs: 2.1 10*3/uL (ref 1.7–7.7)
Neutrophils Relative %: 54 %
Platelet Count: 259 10*3/uL (ref 150–400)
RBC: 3.75 MIL/uL — ABNORMAL LOW (ref 3.87–5.11)
RDW: 15.9 % — ABNORMAL HIGH (ref 11.5–15.5)
WBC Count: 3.9 10*3/uL — ABNORMAL LOW (ref 4.0–10.5)
nRBC: 0 % (ref 0.0–0.2)

## 2023-05-25 MED ORDER — HEPARIN SOD (PORK) LOCK FLUSH 100 UNIT/ML IV SOLN
500.0000 [IU] | Freq: Once | INTRAVENOUS | Status: AC | PRN
  Administered 2023-05-25: 500 [IU]

## 2023-05-25 MED ORDER — SODIUM CHLORIDE 0.9% FLUSH
10.0000 mL | INTRAVENOUS | Status: DC | PRN
  Administered 2023-05-25: 10 mL

## 2023-05-25 MED ORDER — DEXTROSE 5 % IV SOLN
32.0000 mg/m2 | Freq: Once | INTRAVENOUS | Status: AC
  Administered 2023-05-25: 50 mg via INTRAVENOUS
  Filled 2023-05-25: qty 25

## 2023-05-25 MED ORDER — ALPRAZOLAM 0.25 MG PO TABS
0.2500 mg | ORAL_TABLET | Freq: Two times a day (BID) | ORAL | 0 refills | Status: DC | PRN
  Filled 2023-05-25: qty 60, 30d supply, fill #0

## 2023-05-25 MED ORDER — SODIUM CHLORIDE 0.9% FLUSH
10.0000 mL | Freq: Once | INTRAVENOUS | Status: AC
Start: 2023-05-25 — End: 2023-05-25
  Administered 2023-05-25: 10 mL

## 2023-05-25 MED ORDER — DEXTROSE 5 % IV SOLN: INTRAVENOUS | Status: DC

## 2023-05-25 MED ORDER — DEXAMETHASONE SODIUM PHOSPHATE 10 MG/ML IJ SOLN
10.0000 mg | Freq: Once | INTRAMUSCULAR | Status: AC
  Administered 2023-05-25: 10 mg via INTRAVENOUS
  Filled 2023-05-25: qty 1

## 2023-05-25 NOTE — Assessment & Plan Note (Signed)
 The patient developed signs and symptoms of hand-foot syndrome but at the very mild grade 1 I recommend topical emollient cream and vigilant hand care I will reduce the dose of her liposomal doxorubicin I plan minimum 3 doses before repeat imaging study

## 2023-05-25 NOTE — Progress Notes (Signed)
 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 The patient denies recent history of fevers, cough, chills, diarrhea or dysuria. She is asymptomatic from the leukopenia. I will observe for now.    No orders of the defined types were placed in this encounter.   All questions were answered. The patient knows to call the clinic with any problems, questions or concerns. The total time spent in the appointment was 40 minutes encounter with patients including review of chart and various tests results, discussions about plan of care and coordination of care plan   Almarie Bedford, MD 05/25/2023 3:54 PM  INTERVAL HISTORY: Please see below for problem oriented charting. she returns for cycle 2 of treatment Since last time I saw her, she developed erythematous changes and itchiness on her palms but not on her feet No mucositis, nausea or recent infection  REVIEW OF SYSTEMS:   Constitutional: Denies fevers, chills or abnormal weight loss Eyes: Denies blurriness of vision Ears, nose, mouth, throat, and face: Denies mucositis or sore throat Respiratory: Denies cough, dyspnea or wheezes Cardiovascular: Denies palpitation, chest discomfort or lower extremity swelling Gastrointestinal:  Denies nausea, heartburn or change in bowel habits Lymphatics:  Denies new lymphadenopathy or easy bruising Neurological:Denies numbness, tingling or new weaknesses Behavioral/Psych: Mood is stable, no new changes  All other systems were reviewed with the patient and are negative.  I have reviewed the past medical history, past surgical history, social history and family history with the patient and they are unchanged from previous note.  ALLERGIES:  is allergic to carboplatin , skin adhesives [cyanoacrylate], shrimp [shellfish allergy], sulfasalazine, sulfonamide derivatives, and telithromycin.  MEDICATIONS:  Current Outpatient Medications  Medication Sig Dispense Refill   ALPRAZolam  (XANAX ) 0.25 MG tablet Take 1 tablet (0.25 mg total) by mouth 2 (two) times daily as needed for anxiety or sleep. 60 tablet 0   Calcium  Citrate (CAL-CITRATE PO) 1 poq     Calcium -Magnesium -Vitamin D 200-100-33.3 MG-MG-UNIT CAPS take one by mouth as needed Oral     carboxymethylcellulose (REFRESH PLUS) 0.5 % SOLN Place 1 drop into both eyes 2 (two) times daily as needed (dry eyes).     Cholecalciferol (VITAMIN D3) 25 MCG (1000 UT) CAPS Take 1,000 Units by mouth daily.      diphenhydrAMINE  (BENADRYL  ALLERGY) 25 MG tablet Take by mouth.     escitalopram (LEXAPRO) 10 MG tablet 1 po qd Oral     esomeprazole (NEXIUM) 20 MG capsule Take 20 mg by mouth daily.      estradiol  (ESTRACE  VAGINAL) 0.1 MG/GM vaginal cream Place 1 applicatorful vaginally 3 (three) times a week. 42.5 g 12   lidocaine -prilocaine  (EMLA ) cream Apply topically as needed. 30 g 1   lisinopril  (ZESTRIL ) 10 MG tablet Take 1 tablet (10 mg total) by mouth daily.     loratadine (CLARITIN) 10 MG tablet Take 10 mg by mouth daily  as needed for allergies.     Multiple Vitamins-Minerals (MULTI FOR HER PO) 1 poq     mupirocin ointment (BACTROBAN) 2 % 1 application Externally Twice a day     ondansetron  (ZOFRAN ) 8 MG tablet Take 1 tablet by mouth 2 times daily as needed. Start on the third day after chemotherapy. 30 tablet 1    pantoprazole  (PROTONIX ) 40 MG tablet 1 po qd on an empty stomach Oral     prochlorperazine  (COMPAZINE ) 10 MG tablet Take 1 tablet by mouth every 6 hours as needed (Nausea or vomiting). 30 tablet 1   No current facility-administered medications for this visit.   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 14.7 cm poorly defined soft tissue mass in central pelvis suspicious for primary ovarian carcinoma, with uterine leiomyosarcoma considered a less likely differential diagnosis. 2. Moderate ascites and diffuse peritoneal carcinomatosis. 3. Several small uterine fibroids.   11/13/2018 Tumor Marker   Patient's tumor was tested for the following markers: CA-125 Results of the tumor marker test revealed 1434   11/21/2018 Procedure   Successful ultrasound-guided diagnostic and therapeutic paracentesis yielding 3.1 liters of peritoneal fluid   11/21/2018 Pathology Results   PERITONEAL/ASCITIC FLUID(SPECIMEN 1 OF 1 COLLECTED 11/21/18): ADENOCARCINOMA. Specimen Clinical Information Pelvic mass suspicious for ovarian cancer Source Peritoneal/Ascitic Fluid, (specimen 1 of 1 collected 11/21/18) Gross Specimen: Received is/are 1000 ccs of dark amber fluid. (CM:cm) Prepared: # Smears: 0 # Concentration Technique Slides (i.e. ThinPrep): 1 # Cell Block: 1 Additional Studies: n/a Comment Comment: The cytologic  features are most consistent with serous carcinoma.   11/29/2018 Initial Diagnosis   Ovarian cancer (HCC)   11/29/2018 Cancer Staging   Staging form: Ovary, Fallopian Tube, and Primary Peritoneal Carcinoma, AJCC 8th Edition - Clinical: cT3, cN0, cM0 - Signed by Lonn Hicks, MD on 11/29/2018   12/03/2018 Procedure   Successful ultrasound-guided therapeutic paracentesis yielding 3.7 liters of peritoneal fluid.     12/06/2018 Procedure   Placement of a subcutaneous port device. Catheter tip at the SVC and right atrium junction   12/14/2018 Procedure   Successful ultrasound-guided paracentesis yielding 2.9 L of peritoneal fluid   12/28/2018 Tumor Marker   Patient's tumor was tested for the following markers: CA-125 Results of the tumor marker test revealed 812.   12/28/2018 - 04/22/2019 Chemotherapy   The patient had carboplatin  and taxol  for neoadjuvant treatment, followed by interval debulking surgery and subsequent adjuvant chemotherapy treatment.     02/01/2019 Imaging   Ct abdomen and pelvis 8.6 cm left ovarian mass, corresponding to the patient's known primary neoplasm, improved.   Mild peritoneal nodularity/omental caking, improved.   Small abdominopelvic ascites, improved.     02/01/2019 Tumor Marker   Patient's tumor was tested for the following markers: CA-125 Results of the tumor marker test revealed 183.   02/12/2019 Surgery   Preoperative Diagnosis: Stage IIIC ovarian cancer,  s/p neoadjuvant chemotherapy      Procedure(s) Performed: 1. Exploratory laparotomy with total abdominal hysterectomy, bilateral salpingo-oophorectomy, omentectomy radical tumor debulking for ovarian cancer.   Surgeon: Maurilio JAYSON Ship, MD.    Operative Findings: upper abdomen free of disease. No visible omental disease. Small volume (200cc) ascites. 8cm friable mass replacing left ovary and adherent to the sigmoid colon mesentery and ureter on the left.  Anterior fibroid. This represented an optimal  cytoreduction (R0) with no gross visible disease remaining.    02/12/2019 Pathology Results   A. OVARY AND FALLOPIAN TUBE, LEFT, SALPINGO-OOPHORECTOMY: - Serous carcinoma, high grade, status post neoadjuvant therapy - See oncology table and comment below B. UTERUS CERVIX WITH RIGHT FALLOPIAN TUBE AND OVARY, HYSTERECTOMY: Uterus: - Serosal surface involved by serous carcinoma - Endomyometrium uninvolved by carcinoma - Benign endometrial polyp (4.1 cm) - Leiomyomata (5.5 cm; largest) - Adenomyosis Cervix: - Uninvolved by carcinoma Left ovary: - Serous carcinoma, high grade Left fallopian tube: - Serous carcinoma, high grade C. SOFT TISSUE, LEFT PELVIC SIDEWALL TUMOR, EXCISION: - Metastatic serous carcinoma, high-grade D. SOFT TISSUE, SIGMOID COLON MESENTERY, EXCISION: - Metastatic serous carcinoma, high-grade E. OMENTUM, TUMOR RESECTION: - Metastatic serous carcinoma, high-grade OVARY or FALLOPIAN TUBE or PRIMARY PERITONEUM: Procedure: Total hysterectomy and bilateral salpingo-oophorectomy, omentectomy and peritoneal biopsies Specimen Integrity: Fragmented Tumor Site: Left ovary and fallopian tube Ovarian Surface Involvement: Present Fallopian Tube Surface Involvement: Present Tumor Size: 6.3 cm (see comment) Histologic Type: Serous carcinoma Histologic Grade: High-grade Implants: Not applicable Other Tissue/ Organ Involvement: Right ovary, right fallopian tube, omentum, mesentery Largest Extrapelvic Peritoneal Focus: Macroscopic Peritoneal/Ascitic Fluid: Malignant Treatment Effect: No definite or minimal response identified (chemotherapy response score 1 [CRS1] Regional Lymph Nodes: No lymph nodes submitted or found Pathologic Stage Classification (pTNM, AJCC 8th Edition): pT3c, pNX Representative Tumor Block: A4 Comment(s): Additional testing (HER-2, MMR and MSI) are pending. The primary tumor site appears to be the left ovary and fallopian tube. The uterus is only involved  on the serosal surface. There is tumor on the anterior peritoneal reflection.  Addendum: Tumor is Her2 negative,MSI stable   02/19/2019 Tumor Marker   Patient's tumor was tested for the following markers: CA-125 Results of the tumor marker test revealed 40.3   04/01/2019 Tumor Marker   Patient's tumor was tested for the following markers: CA-125 Results of the tumor marker test revealed 8.1    Genetic Testing   Negative testing. No pathogenic variants identified on the [employer]. The report date is 04/17/2019.  Somatic genes analyzed through [facility]: ATM, BARD1, BRCA1, BRCA2, BRIP1, CHEK2, MRE11A, NBN, PALB2, RAD51C, RAD51D.  The [facility]Next gene panel offered by [employer] includes sequencing and rearrangement analysis for the following 36 genes: APC*, ATM*, AXIN2, BARD1, BMPR1A, BRCA1*, BRCA2*, BRIP1*, CDH1*, CDK4, CDKN2A, CHEK2*, DICER1, MLH1*, MSH2*, MSH3, MSH6*, MUTYH*, NBN, NF1*, NTHL1, PALB2*, PMS2*, PTEN*, RAD51C*, RAD51D*, RECQL, SMAD4, SMARCA4, STK11 and TP53* (sequencing and deletion/duplication); HOXB13, POLD1 and POLE (sequencing only); EPCAM and GREM1 (deletion/duplication only).    05/21/2019 Imaging   1. Interval hysterectomy, oophorectomy and omentectomy. No evidence of residual ovarian carcinoma in the pelvis. No evidence of peritoneal disease. No intraperitoneal free fluid. 2. Nonobstructing LEFT renal calculi.  Normal ureters.   05/22/2019 Tumor Marker   Patient's tumor was tested for the following markers: CA-125 Results of the tumor marker test revealed 5.7.   06/03/2019 Procedure   Successful right IJ vein Port-A-Cath explant.   08/26/2019 Tumor Marker   Patient's tumor was tested for the  following markers: CA-125. Results of the tumor marker test revealed 4.9   10/03/2019 Imaging   1. New tumor deposits along the capsular surfaces of the liver, spleen, in the left pelvis, and right paracolic gutter, compatible with metastatic  disease. 2. Mild dilation of the dorsal pancreatic duct in the pancreatic body and head, cause uncertain. 3. Nonobstructive left nephrolithiasis. 4. Aortic atherosclerosis.   Aortic Atherosclerosis (ICD10-I70.0   10/10/2019 Procedure   Successful placement of a right IJ approach Power Port with ultrasound and fluoroscopic guidance. The catheter is ready for use.     10/11/2019 Tumor Marker   Patient's tumor was tested for the following markers: CA-125 Results of the tumor marker test revealed 19.6.   10/14/2019 -  Chemotherapy   The patient had carboplatin  and gemzar  for chemotherapy treatment.     11/04/2019 Tumor Marker   Patient's tumor was tested for the following markers: CA-125 Results of the tumor marker test revealed 11.4   11/28/2019 Tumor Marker   Patient's tumor was tested for the following markers: CA-125 Results of the tumor marker test revealed 5.8   12/20/2019 Imaging   1. Significant interval reduction in mixed solid and cystic nodule in the vicinity of the left ovary, with significant improvement or resolution of multiple pelvic, peritoneal, and organ capsule implants. Resolution of previously seen small volume ascites. Findings are consistent with treatment response of abdominal metastatic disease. 2. Status post hysterectomy, oophorectomy, and omentectomy. 3. Nonobstructive left nephrolithiasis. 4. Aortic Atherosclerosis (ICD10-I70.0).   12/23/2019 Tumor Marker   Patient's tumor was tested for the following markers: CA-125 Results of the tumor marker test revealed 5.0   01/24/2020 Tumor Marker   Patient's tumor was tested for the following markers: CA-125 Results of the tumor marker test revealed 4.8   02/24/2020 Tumor Marker   Patient's tumor was tested for the following markers: CA-125. Results of the tumor marker test revealed 4.3.   03/06/2020 Imaging   1. Status post hysterectomy, bilateral oophorectomy, and omentectomy. 2. Resolution of previously described  left adnexal nodularity. 3. No residual disease identified. 4.  Aortic Atherosclerosis (ICD10-I70.0). 5. Left nephrolithiasis.   04/20/2020 Tumor Marker   Patient's tumor was tested for the following markers: CA-125 Results of the tumor marker test revealed 4.6   05/18/2020 Tumor Marker   Patient's tumor was tested for the following markers: CA-125 Results of the tumor marker test revealed 4.7   05/29/2020 Imaging   Status post hysterectomy and bilateral salpingo oophorectomy.   No evidence of recurrent or metastatic disease.   06/08/2020 - 09/13/2021 Chemotherapy   She was on niraparib        06/17/2020 Tumor Marker   Patient's tumor was tested for the following markers: CA-125 Results of the tumor marker test revealed 4.5   08/04/2020 Tumor Marker   Patient's tumor was tested for the following markers: CA-125. Results of the tumor marker test revealed 5.7   09/07/2020 Imaging   1. Status post hysterectomy and bilateral salpingo oophorectomy. No evidence of recurrent or metastatic disease. 2. Nonobstructing left nephrolithiasis. 3. Left-sided colonic diverticulosis without findings of acute diverticulitis. 4. Aortic atherosclerosis.     09/07/2020 Tumor Marker   Patient's tumor was tested for the following markers: CA-125 Results of the tumor marker test revealed 4.8.   10/21/2020 Tumor Marker   Patient's tumor was tested for the following markers: CA-125. Results of the tumor marker test revealed 5.1.   12/14/2020 Tumor Marker   Patient's tumor was tested for the following  markers: CA-125. Results of the tumor marker test revealed 5.5.   04/05/2021 Tumor Marker   Patient's tumor was tested for the following markers: CA-125. Results of the tumor marker test revealed 5.9.   05/31/2021 Tumor Marker   Patient's tumor was tested for the following markers: CA-125. Results of the tumor marker test revealed 8.1.   09/13/2021 Imaging   1. Interval development of multiple new  cystic and solid lesions in the abdomen and pelvis measuring up to 10.3 x 7.2 cm and consistent with peritoneal metastatic disease. 2. Nonobstructing left renal stones. 3. Aortic Atherosclerosis (ICD10-I70.0).     09/14/2021 Tumor Marker   Patient's tumor was tested for the following markers: CA-125. Results of the tumor marker test revealed 28.5.   09/16/2021 Echocardiogram   1. Left ventricular ejection fraction, by estimation, is 60 to 65%. The left ventricle has normal function. The left ventricle has no regional wall motion abnormalities. Left ventricular diastolic parameters are consistent with Grade I diastolic dysfunction (impaired relaxation).  2. Right ventricular systolic function is normal. The right ventricular size is normal.  3. The mitral valve is normal in structure. Trivial mitral valve regurgitation. No evidence of mitral stenosis.  4. The aortic valve has an indeterminant number of cusps. Aortic valve regurgitation is trivial. No aortic stenosis is present.  5. The inferior vena cava is normal in size with greater than 50% respiratory variability, suggesting right atrial pressure of 3 mmHg.     09/20/2021 - 01/17/2022 Chemotherapy   Patient is on Treatment Plan : OVARIAN RECURRENT Liposomal Doxorubicin  + Carboplatin  q28d X 6 Cycles     09/20/2021 - 02/22/2022 Chemotherapy   Patient is on Treatment Plan : OVARIAN RECURRENT Liposomal Doxorubicin  + Carboplatin  q28d X 6 Cycles     09/23/2021 Tumor Marker   Patient's tumor was tested for the following markers: CA-125. Results of the tumor marker test revealed 27.7.   11/24/2021 Tumor Marker   Patient's tumor was tested for the following markers: CA-125. Results of the tumor marker test revealed 28.4.   12/13/2021 Imaging   1. Overall stable peritoneal carcinomatosis. No new disease is demonstrated. 2. Moderate left-sided hydroureteronephrosis likely due to compression of the left ureter by the left pelvic mass. 3. Stable  left-sided renal calculi. 4. Numerous left renal calculi.     12/22/2021 Tumor Marker   Patient's tumor was tested for the following markers: CA-125. Results of the tumor marker test revealed 24.7.   01/18/2022 Tumor Marker   Patient's tumor was tested for the following markers: CA-125. Results of the tumor marker test revealed 9.9.   01/27/2022 Echocardiogram    1. Left ventricular ejection fraction, by estimation, is 60 to 65%. The left ventricle has normal function. The left ventricle has no regional wall motion abnormalities. Left ventricular diastolic parameters are consistent with Grade I diastolic dysfunction (impaired relaxation). The average left ventricular global longitudinal strain is -21.1 %. The global longitudinal strain is normal.  2. Right ventricular systolic function is normal. The right ventricular size is normal.  3. The mitral valve is abnormal. Mild mitral valve regurgitation.  4. The aortic valve is tricuspid. Aortic valve regurgitation is trivial.  5. The inferior vena cava is normal in size with greater than 50% respiratory variability, suggesting right atrial pressure of 3 mmHg.   02/23/2022 Tumor Marker   Patient's tumor was tested for the following markers: CA-125. Results of the tumor marker test revealed 8.5.   03/18/2022 Imaging   1. Diminished size  of nodular and mixed solid and cystic peritoneal implants in the hepatorenal fossa, right lower quadrant, and left pelvis. 2. Unchanged solid mass in the right pelvis measuring 4.0 x 3.7 cm. 3. Constellation of findings is overall consistent with treatment response 4. Status post hysterectomy, oophorectomy, and omentectomy. 5. Nonobstructive left nephrolithiasis.   Aortic Atherosclerosis (ICD10-I70.0).     03/22/2022 - 08/16/2022 Chemotherapy   Patient is on Treatment Plan : Ovarian Bevacizumab  q21d     03/24/2022 Tumor Marker   Patient's tumor was tested for the following markers: CA-125. Results of the tumor  marker test revealed 7.1.   04/28/2022 Tumor Marker   Patient's tumor was tested for the following markers: CA-125. Results of the tumor marker test revealed 7.0.   05/27/2022 Tumor Marker   Patient's tumor was tested for the following markers: CA-125. Results of the tumor marker test revealed 7.7.   06/14/2022 Imaging   1. Slight response to therapy as evidenced by decrease in size of a cystic and solid mass in the left adnexa as well as peritoneal implants in the right paracolic gutter. Solid right adnexal mass and additional peritoneal implants along right hepatic lobe and in the left anatomic pelvis appear stable. 2. Hepatic steatosis. 3. Left renal stones. 4.  Aortic atherosclerosis (ICD10-I70.0).   06/17/2022 Tumor Marker   Patient's tumor was tested for the following markers: CA-125. Results of the tumor marker test revealed 11.2.   09/05/2022 Imaging   CT ABDOMEN PELVIS W CONTRAST  Result Date: 09/06/2022 CLINICAL DATA:  Ovarian cancer.  Restaging.  * Tracking Code: BO * EXAM: CT ABDOMEN AND PELVIS WITH CONTRAST TECHNIQUE: Multidetector CT imaging of the abdomen and pelvis was performed using the standard protocol following bolus administration of intravenous contrast. RADIATION DOSE REDUCTION: This exam was performed according to the departmental dose-optimization program which includes automated exposure control, adjustment of the mA and/or kV according to patient size and/or use of iterative reconstruction technique. CONTRAST:  OMNIPAQUE  IOHEXOL  300 MG/ML  SOLN COMPARISON:  06/13/2022 FINDINGS: Lower chest: Unremarkable. Hepatobiliary: No suspicious focal abnormality within the liver parenchyma. There is no evidence for gallstones, gallbladder wall thickening, or pericholecystic fluid. No intrahepatic or extrahepatic biliary dilation. Pancreas: No pancreatic mass lesion evident. Diffuse prominence of the main pancreatic duct is stable measuring up to 3-4 mm diameter in the head of  the pancreas. Spleen: No splenomegaly. No focal mass lesion. Adrenals/Urinary Tract: No adrenal nodule or mass. Right kidney unremarkable. A cluster of nonobstructing stones in the upper pole right kidney is again noted with dominant stone measuring about 6 mm in size. No evidence for hydroureter. The urinary bladder appears normal for the degree of distention. Stomach/Bowel: Tiny hiatal hernia. Stomach otherwise unremarkable. Duodenum is normally positioned as is the ligament of Treitz. No small bowel wall thickening. No small bowel dilatation. The terminal ileum is normal. The appendix is normal. No gross colonic mass. No colonic wall thickening. Diverticular changes are noted in the left colon without evidence of diverticulitis. Vascular/Lymphatic: There is mild atherosclerotic calcification of the abdominal aorta without aneurysm. There is no gastrohepatic or hepatoduodenal ligament lymphadenopathy. No retroperitoneal or mesenteric lymphadenopathy. No pelvic sidewall lymphadenopathy. Reproductive: Right-sided adnexal mass measured previously at 4.2 x 3.8 cm is now 4.9 x 4.6 cm. Cystic and solid left adnexal lesion measured previously at 4.1 x 3.4 cm is now 3.2 x 2.9 cm. 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 The more anterior of the 2 index lesions measures 2.6  x 2.1 cm today compared to 2.4 x 1.6 cm previously. The more posterior lesion is 2.4 x 1.4 cm today compared to 2.0 x 1.9 cm previously. 5 mm soft tissue nodule identified in the inferior paracolic gutter previously at 5 mm measures 10 mm today on image 51/2. Implants along the left posterior peritoneum adjacent to the psoas muscle and iliac vessels appear progressive. 1.6 x 1.3 cm lesion visible on 58/2 was 1.1 x 0.8 cm previously (remeasured). No evidence for ascites. Musculoskeletal: No worrisome lytic or sclerotic osseous abnormality. IMPRESSION: 1. Overall generalized appearance of mild disease progression. 2. Index  right-sided adnexal mass has progressed since the prior with interval decrease in size of the cystic and solid left adnexal lesion. 3. Interval progression of index capsular implants along the medial right liver. 4. Interval progression of soft tissue nodule in the inferior right paracolic gutter and peritoneal implants in the lower left abdomen and pelvis. 5. No evidence for ascites. 6. Nonobstructing left renal stones. 7. Tiny hiatal hernia. 8. Left colonic diverticulosis without diverticulitis. 9.  Aortic Atherosclerosis (ICD10-I70.0). Electronically Signed   By: Camellia Candle M.D.   On: 09/06/2022 07:57      09/15/2022 - 03/31/2023 Chemotherapy   Patient is on Treatment Plan : OVARIAN Paclitaxel  (175) q21d X 6 Cycles     11/16/2022 Imaging   CT ABDOMEN PELVIS W CONTRAST  Result Date: 11/16/2022 CLINICAL DATA:  Ovarian carcinoma. Assess treatment response. * Tracking Code: BO * EXAM: CT ABDOMEN AND PELVIS WITH CONTRAST TECHNIQUE: Multidetector CT imaging of the abdomen and pelvis was performed using the standard protocol following bolus administration of intravenous contrast. RADIATION DOSE REDUCTION: This exam was performed according to the departmental dose-optimization program which includes automated exposure control, adjustment of the mA and/or kV according to patient size and/or use of iterative reconstruction technique. CONTRAST:  OMNIPAQUE  IOHEXOL  300 MG/ML  SOLN COMPARISON:  None Available. FINDINGS: Lower chest: Lung bases are clear. Hepatobiliary: No focal hepatic lesion. Normal gallbladder. No biliary duct dilatation. Common bile duct is normal. Peritoneal implant described along the margin the RIGHT hepatic liver is much less conspicuous. Capsular lesion measuring 12 mm on image 14/2 is decreased from 26 mm. Pancreas: Pancreas is normal. No ductal dilatation. No pancreatic inflammation. Spleen: Normal spleen Adrenals/urinary tract: Adrenal glands normal. Multiple calculi within LEFT  kidney without obstruction. Ureters and bladder normal. Stomach/Bowel: Stomach, small bowel, appendix, and cecum are normal. Multiple diverticula of the descending colon and sigmoid colon without acute inflammation. Vascular/Lymphatic: Abdominal aorta is normal caliber. No periportal or retroperitoneal adenopathy. No pelvic adenopathy. Reproductive: Solid enhancing mass in the deep RIGHT pelvis adjacent the rectum measures 4.0 x 4.3 cm compared to 4.6 x 4.9 cm. Small adjacent satellite nodule measuring 1.1 cm (image 63) is unchanged. Cystic lesion of the LEFT adnexa with mural nodularity measures 3.0 x 2.4 cm compared to 3.2 x 2.9 cm. No new nodularity in the pelvis. Post hysterectomy. Other: Interval decrease in the peritoneal nodularity. For example peritoneal nodule adjacent to the cecum in the RIGHT iliac fossa measures 6 mm (image 43/2 compared to 10 mm on prior. Lesions on the LEFT iliac vessels less prominent. For example 7 mm lesion on 50/2 decreased from 16 mm. Musculoskeletal: No aggressive osseous lesion. IMPRESSION: 1. Interval decrease in size of RIGHT pelvic mass. 2. Interval decrease in size of LEFT adnexal cystic mass. 3. Interval decrease in size of peritoneal nodularity. 4. Interval decrease in size of perihepatic peritoneal implant. 5. No  new metastatic disease. Electronically Signed   By: Jackquline Boxer M.D.   On: 11/16/2022 14:34      02/08/2023 Imaging   CT ABDOMEN PELVIS W CONTRAST  Result Date: 02/08/2023 CLINICAL DATA:  Ovarian carcinoma.  Assess treatment response. EXAM: CT ABDOMEN AND PELVIS WITH CONTRAST TECHNIQUE: Multidetector CT imaging of the abdomen and pelvis was performed using the standard protocol following bolus administration of intravenous contrast. RADIATION DOSE REDUCTION: This exam was performed according to the departmental dose-optimization program which includes automated exposure control, adjustment of the mA and/or kV according to patient size and/or use of  iterative reconstruction technique. CONTRAST:  OMNIPAQUE  IOHEXOL  300 MG/ML  SOLN COMPARISON:  CT 11/14/2022 FINDINGS: Lower chest: Lung bases are clear. Hepatobiliary: No focal hepatic lesion. Normal gallbladder. No biliary duct dilatation. Common bile duct is normal. Small peritoneal implant along the RIGHT hepatic lobe is less conspicuous at 6 mm (image 18/2) compared to 11 mm on prior. Pancreas: Pancreas is normal. No ductal dilatation. No pancreatic inflammation. Spleen: Normal spleen Adrenals/urinary tract: Adrenal glands normal. Multiple nonobstructing calculi in the upper pole of the LEFT kidney. Ureters and bladder normal. Stomach/Bowel: Stomach, small bowel, appendix, and cecum are normal. The colon and rectosigmoid colon are normal. Vascular/Lymphatic: Abdominal aorta is normal caliber with atherosclerotic calcification. There is no retroperitoneal or periportal lymphadenopathy. No pelvic lymphadenopathy. Reproductive: Post hysterectomy anatomy. Again demonstrated rounded mass posterior to the vaginal cuff just RIGHT of midline measures 3.7 by 3.0 cm (image 69/series 2) compared to 4.3 x 4.0 cm on most recent CT. Part cystic and solid lesion in the LEFT adnexa measures 2.2 x 1.6 cm (image 65) compared to 3.0 x 2.2 cm. Previously measured peritoneal implant in the deep RIGHT pelvis is no longer identified. No new mass lesions within the pelvis. Other: No new peritoneal disease.  No free fluid. Musculoskeletal: No aggressive osseous lesion. IMPRESSION: 1. Interval decrease in size rounded mass in the deep RIGHT pelvis. 2. Interval decrease in size of cystic LEFT adnexal lesion. 3. Decrease in size of peritoneal implants. 4. No evidence of new metastatic disease. No intraperitoneal free fluid Electronically Signed   By: Jackquline Boxer M.D.   On: 02/08/2023 10:56      04/10/2023 Imaging   CT ABDOMEN PELVIS W CONTRAST  Result Date: 04/13/2023 CLINICAL DATA:  Ovarian cancer.  Restaging.  *  Tracking Code: BO * EXAM: CT ABDOMEN AND PELVIS WITH CONTRAST TECHNIQUE: Multidetector CT imaging of the abdomen and pelvis was performed using the standard protocol following bolus administration of intravenous contrast. RADIATION DOSE REDUCTION: This exam was performed according to the departmental dose-optimization program which includes automated exposure control, adjustment of the mA and/or kV according to patient size and/or use of iterative reconstruction technique. CONTRAST:  OMNIPAQUE  IOHEXOL  300 MG/ML  SOLN COMPARISON:  01/31/2023 FINDINGS: Lower chest: No acute findings. Hepatobiliary: No suspicious focal abnormality within the liver parenchyma. There is no evidence for gallstones, gallbladder wall thickening, or pericholecystic fluid. No intrahepatic or extrahepatic biliary dilation. Pancreas: No focal mass lesion. No dilatation of the main duct. No intraparenchymal cyst. No peripancreatic edema. Spleen: No splenomegaly. No suspicious focal mass lesion. Adrenals/Urinary Tract: No adrenal nodule or mass. Right kidney unremarkable. Clustered nonobstructing stones are seen in the upper pole left kidney measuring up to 7 mm diameter. No evidence for hydroureter. The urinary bladder appears normal for the degree of distention. Stomach/Bowel: Stomach is unremarkable. No gastric wall thickening. No evidence of outlet obstruction. Duodenum is normally  positioned as is the ligament of Treitz. No small bowel wall thickening. No small bowel dilatation. The terminal ileum is normal. The appendix is normal. No gross colonic mass. No colonic wall thickening. Diverticular changes are noted in the left colon without evidence of diverticulitis. Vascular/Lymphatic: There is mild atherosclerotic calcification of the abdominal aorta without aneurysm. There is no gastrohepatic or hepatoduodenal ligament lymphadenopathy. No retroperitoneal or mesenteric lymphadenopathy. No pelvic sidewall lymphadenopathy. Reproductive:  Uterus surgically absent. Right paramidline pelvic mass lesion again evident. This is just posterior to the right vaginal cuff measuring 4.2 x 4.2 cm on image 68/2 today which compares to 3.7 x 3.1 cm previously. Cystic lesion in the left adnexal space is also progressive measuring 2.5 x 1.9 cm today compared to 2.2 x 1.6 cm previously. There is some enhancing soft tissue along the antro inferior aspect of this lesion (see image 65/2). No new mesenteric or peritoneal lesion evident. Other: No intraperitoneal free fluid. Musculoskeletal: No worrisome lytic or sclerotic osseous abnormality. IMPRESSION: 1. Interval progression of the right pelvic lesion along the vaginal cuff measuring 4.2 x 4.2 cm today compared to 3.7 x 3.1 cm previously. Cystic lesion in the left adnexal space is also progressive measuring 2.5 x 1.9 cm today compared to 2.2 x 1.6 cm previously. 2. No new mesenteric or peritoneal lesion evident. 3. Clustered nonobstructing stones in the upper pole left kidney measuring up to 7 mm diameter. 4. Left colonic diverticulosis without diverticulitis. 5.  Aortic Atherosclerosis (ICD10-I70.0). Electronically Signed   By: Camellia Candle M.D.   On: 04/13/2023 14:04   DG Chest 2 View  Result Date: 03/22/2023 CLINICAL DATA:  Cough EXAM: CHEST - 2 VIEW COMPARISON:  None Available. FINDINGS: Right-sided chest port terminates at the level of the distal SVC. The heart size and mediastinal contours are within normal limits. Both lungs are clear. The visualized skeletal structures are unremarkable. IMPRESSION: No active cardiopulmonary disease. Electronically Signed   By: Mabel Converse D.O.   On: 03/22/2023 16:29       04/18/2023 Echocardiogram       1. Left ventricular ejection fraction, by estimation, is 60 to 65%. The left ventricle has normal function. The left ventricle has no regional wall motion abnormalities. There is mild left ventricular hypertrophy. Left ventricular diastolic parameters  are  consistent with Grade I diastolic dysfunction (impaired relaxation). The average left ventricular global longitudinal strain is -19.3 %. The global longitudinal strain is normal.  2. Right ventricular systolic function is normal. The right ventricular size is normal. Tricuspid regurgitation signal is inadequate for assessing PA pressure.  3. The mitral valve is normal in structure. Trivial mitral valve regurgitation. No evidence of mitral stenosis.  4. The aortic valve was not well visualized. Aortic valve regurgitation is trivial. No aortic stenosis is present.  5. The inferior vena cava is normal in size with greater than 50% respiratory variability, suggesting right atrial pressure of 3 mmHg.   04/26/2023 -  Chemotherapy   Patient is on Treatment Plan : OVARIAN Liposomal Doxorubicin  (40) q28d       PHYSICAL EXAMINATION: ECOG PERFORMANCE STATUS: 1 - Symptomatic but completely ambulatory  Vitals:   05/25/23 1207  BP: 136/75  Pulse: 89  Resp: 18  Temp: 97.6 F (36.4 C)  SpO2: 98%   Filed Weights   05/25/23 1207  Weight: 146 lb 9.6 oz (66.5 kg)    GENERAL:alert, no distress and comfortable SKIN: She has erythematous changes to her palms.  No fissures, blisters  or bleeding noted NEURO: alert & oriented x 3 with fluent speech, no focal motor/sensory deficits  LABORATORY DATA:  I have reviewed the data as listed    Component Value Date/Time   NA 141 05/25/2023 1147   K 3.7 05/25/2023 1147   CL 107 05/25/2023 1147   CO2 28 05/25/2023 1147   GLUCOSE 95 05/25/2023 1147   BUN 13 05/25/2023 1147   CREATININE 0.54 05/25/2023 1147   CALCIUM  9.5 05/25/2023 1147   PROT 6.5 05/25/2023 1147   ALBUMIN  4.2 05/25/2023 1147   AST 25 05/25/2023 1147   ALT 23 05/25/2023 1147   ALKPHOS 74 05/25/2023 1147   BILITOT 1.2 05/25/2023 1147   GFRNONAA >60 05/25/2023 1147   GFRAA >60 02/24/2020 0852    No results found for: SPEP, UPEP  Lab Results  Component Value Date   WBC 3.9 (L)  05/25/2023   NEUTROABS 2.1 05/25/2023   HGB 12.2 05/25/2023   HCT 36.0 05/25/2023   MCV 96.0 05/25/2023   PLT 259 05/25/2023      Chemistry      Component Value Date/Time   NA 141 05/25/2023 1147   K 3.7 05/25/2023 1147   CL 107 05/25/2023 1147   CO2 28 05/25/2023 1147   BUN 13 05/25/2023 1147   CREATININE 0.54 05/25/2023 1147      Component Value Date/Time   CALCIUM  9.5 05/25/2023 1147   ALKPHOS 74 05/25/2023 1147   AST 25 05/25/2023 1147   ALT 23 05/25/2023 1147   BILITOT 1.2 05/25/2023 1147

## 2023-05-25 NOTE — Assessment & Plan Note (Signed)
 Due to side effects of treatment We discussed importance of vigilant skin care I will prescribe dose reduction

## 2023-05-25 NOTE — Progress Notes (Signed)
 [facility] CSW Progress Note  Visual Merchandiser met with patient in infusion to follow-up on coping. Pt doing mentally well since starting new treatment. She still enjoys good quality of life and was able to spend time with family for Christmas. Pt shared life updates with this CSW. She is still feeling more tired than usual, but it may be lingering effects of covid combined with treatment and a lot to do over the holidays.    CSW will continue to be available to pt by phone and during visits.   Tashona Calk E Menna Abeln, LCSW Clinical Social Worker [facility]

## 2023-05-25 NOTE — Assessment & Plan Note (Addendum)
This is likely due to recent treatment. The patient denies recent history of fevers, cough, chills, diarrhea or dysuria. She is asymptomatic from the leukopenia. I will observe for now.    

## 2023-05-26 ENCOUNTER — Other Ambulatory Visit: Payer: Self-pay

## 2023-05-27 ENCOUNTER — Other Ambulatory Visit (HOSPITAL_COMMUNITY): Payer: Self-pay

## 2023-06-17 ENCOUNTER — Other Ambulatory Visit: Payer: Self-pay

## 2023-06-22 ENCOUNTER — Inpatient Hospital Stay (HOSPITAL_BASED_OUTPATIENT_CLINIC_OR_DEPARTMENT_OTHER): Payer: Medicare Other | Admitting: Hematology and Oncology

## 2023-06-22 ENCOUNTER — Inpatient Hospital Stay: Payer: Medicare Other

## 2023-06-22 ENCOUNTER — Other Ambulatory Visit: Payer: Self-pay | Admitting: Hematology and Oncology

## 2023-06-22 ENCOUNTER — Ambulatory Visit: Payer: Medicare Other

## 2023-06-22 ENCOUNTER — Ambulatory Visit: Payer: Medicare Other | Admitting: Hematology and Oncology

## 2023-06-22 ENCOUNTER — Other Ambulatory Visit: Payer: Medicare Other

## 2023-06-22 ENCOUNTER — Encounter: Payer: Self-pay | Admitting: Hematology and Oncology

## 2023-06-22 ENCOUNTER — Inpatient Hospital Stay: Payer: Medicare Other | Admitting: Licensed Clinical Social Worker

## 2023-06-22 VITALS — BP 150/78 | HR 79 | Resp 16

## 2023-06-22 VITALS — BP 152/81 | HR 80 | Resp 18 | Ht 62.0 in | Wt 148.8 lb

## 2023-06-22 DIAGNOSIS — L271 Localized skin eruption due to drugs and medicaments taken internally: Secondary | ICD-10-CM

## 2023-06-22 DIAGNOSIS — Z7189 Other specified counseling: Secondary | ICD-10-CM

## 2023-06-22 DIAGNOSIS — C562 Malignant neoplasm of left ovary: Secondary | ICD-10-CM

## 2023-06-22 DIAGNOSIS — T50905A Adverse effect of unspecified drugs, medicaments and biological substances, initial encounter: Secondary | ICD-10-CM

## 2023-06-22 DIAGNOSIS — Z5111 Encounter for antineoplastic chemotherapy: Secondary | ICD-10-CM | POA: Diagnosis not present

## 2023-06-22 DIAGNOSIS — C786 Secondary malignant neoplasm of retroperitoneum and peritoneum: Secondary | ICD-10-CM

## 2023-06-22 DIAGNOSIS — I158 Other secondary hypertension: Secondary | ICD-10-CM | POA: Diagnosis not present

## 2023-06-22 LAB — CMP (CANCER CENTER ONLY)
ALT: 21 U/L (ref 0–44)
AST: 26 U/L (ref 15–41)
Albumin: 3.9 g/dL (ref 3.5–5.0)
Alkaline Phosphatase: 93 U/L (ref 38–126)
Anion gap: 5 (ref 5–15)
BUN: 12 mg/dL (ref 8–23)
CO2: 29 mmol/L (ref 22–32)
Calcium: 9.4 mg/dL (ref 8.9–10.3)
Chloride: 105 mmol/L (ref 98–111)
Creatinine: 0.55 mg/dL (ref 0.44–1.00)
GFR, Estimated: >60 mL/min (ref >60)
Glucose, Bld: 98 mg/dL (ref 70–99)
Potassium: 3.7 mmol/L (ref 3.5–5.1)
Sodium: 139 mmol/L (ref 135–145)
Total Bilirubin: 1.2 mg/dL (ref 0.0–1.2)
Total Protein: 6.2 g/dL — ABNORMAL LOW (ref 6.5–8.1)

## 2023-06-22 LAB — CBC WITH DIFFERENTIAL (CANCER CENTER ONLY)
Abs Immature Granulocytes: 0.01 10*3/uL (ref 0.00–0.07)
Basophils Absolute: 0 10*3/uL (ref 0.0–0.1)
Basophils Relative: 1 %
Eosinophils Absolute: 0.1 10*3/uL (ref 0.0–0.5)
Eosinophils Relative: 1 %
HCT: 35.7 % — ABNORMAL LOW (ref 36.0–46.0)
Hemoglobin: 12 g/dL (ref 12.0–15.0)
Immature Granulocytes: 0 %
Lymphocytes Relative: 24 %
Lymphs Abs: 1.1 10*3/uL (ref 0.7–4.0)
MCH: 31.9 pg (ref 26.0–34.0)
MCHC: 33.6 g/dL (ref 30.0–36.0)
MCV: 94.9 fL (ref 80.0–100.0)
Monocytes Absolute: 0.6 10*3/uL (ref 0.1–1.0)
Monocytes Relative: 13 %
Neutro Abs: 2.8 10*3/uL (ref 1.7–7.7)
Neutrophils Relative %: 61 %
Platelet Count: 231 10*3/uL (ref 150–400)
RBC: 3.76 MIL/uL — ABNORMAL LOW (ref 3.87–5.11)
RDW: 14.4 % (ref 11.5–15.5)
WBC Count: 4.7 10*3/uL (ref 4.0–10.5)
nRBC: 0 % (ref 0.0–0.2)

## 2023-06-22 MED ORDER — DEXAMETHASONE SODIUM PHOSPHATE 10 MG/ML IJ SOLN
10.0000 mg | Freq: Once | INTRAMUSCULAR | Status: AC
  Administered 2023-06-22: 10 mg via INTRAVENOUS
  Filled 2023-06-22: qty 1

## 2023-06-22 MED ORDER — DOXORUBICIN HCL LIPOSOMAL CHEMO INJECTION 2 MG/ML
32.0000 mg/m2 | Freq: Once | INTRAVENOUS | Status: AC
  Administered 2023-06-22: 50 mg via INTRAVENOUS
  Filled 2023-06-22: qty 25

## 2023-06-22 MED ORDER — DEXTROSE 5 % IV SOLN
INTRAVENOUS | Status: DC
Start: 2023-06-22 — End: 2023-06-22

## 2023-06-22 MED ORDER — SODIUM CHLORIDE 0.9% FLUSH
10.0000 mL | Freq: Once | INTRAVENOUS | Status: AC
  Administered 2023-06-22: 10 mL

## 2023-06-22 NOTE — Assessment & Plan Note (Signed)
The patient developed signs and symptoms of hand-foot syndrome but at the very mild grade 1 I recommend topical emollient cream and vigilant hand care I will reduce the dose of her liposomal doxorubicin I plan to repeat imaging study and echocardiogram next month I will add Zinecard for future treatment

## 2023-06-22 NOTE — Progress Notes (Signed)
[facility] CSW Progress Note  Visual merchandiser met with patient to follow-up on coping. Pt continues to do fairly well with mood but is still experiencing high fatigue. She is having work done in her home currently which could be contributing to feeling tired. She is looking forward to warmer weather to try to be outside more. Pt feels that quality of life is still better than the negative side effects from treatment.     Stacy Coll E Shresta Risden, LCSW Clinical Social Worker [facility]

## 2023-06-22 NOTE — Progress Notes (Signed)
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[facility]) The patient developed signs and symptoms of hand-foot syndrome but at the very mild grade 1 I recommend topical emollient cream and vigilant hand care I will reduce the dose of her liposomal doxorubicin I plan to repeat imaging study and echocardiogram next month I will add Zinecard for future treatment  Hand foot syndrome Due to side effects of treatment We discussed importance of vigilant skin care  Orders Placed This Encounter  Procedures   CT CHEST ABDOMEN PELVIS W CONTRAST    Standing Status:   Future    Expected Date:   07/13/2023    Expiration Date:   06/21/2024    Scheduling Instructions:     No need oral contrast    If indicated for the ordered procedure, I authorize the administration of contrast media per Radiology protocol:   Yes    Does the patient have a contrast media/X-ray dye allergy?:   No    Preferred imaging location?:   [facility]    If indicated for the ordered procedure, I authorize the administration of oral contrast media per Radiology protocol:   Yes   ECHOCARDIOGRAM COMPLETE    Standing Status:   Future    Expected Date:   07/13/2023    Expiration Date:   06/21/2024    Where should this test be performed:       Perflutren DEFINITY (image enhancing agent) should be administered unless hypersensitivity or allergy exist:   Administer Perflutren    Reason for exam-Echo:   Chemo  Z09    All questions were answered. The patient knows to call the clinic with any problems, questions or concerns. The total time spent in the appointment was 40 minutes encounter with patients including review of chart and various tests results, discussions about plan of care and coordination of care plan   Artis Delay, MD 06/22/2023 9:28  AM  INTERVAL HISTORY: Please see below for problem oriented charting. she returns for chemo follow-up She continues to have signs and symptoms of hand-foot syndrome with some mouth soreness No mucositis, nausea, bloating or changes in bowel habits We discussed timing of echocardiogram and CT imaging and the role of cardioprotective agent for future treatment  REVIEW OF SYSTEMS:   Constitutional: Denies fevers, chills or abnormal weight loss Eyes: Denies blurriness of vision Ears, nose, mouth, throat, and face: Denies mucositis or sore throat Respiratory: Denies cough, dyspnea or wheezes Cardiovascular: Denies palpitation, chest discomfort or lower extremity swelling Gastrointestinal:  Denies nausea, heartburn or change in bowel habits Lymphatics: Denies new lymphadenopathy or easy bruising Neurological:Denies numbness, tingling or new weaknesses Behavioral/Psych: Mood is stable, no new changes  All other systems were reviewed with the patient and are negative.  I have reviewed the past medical history, past surgical history, social history and family history with the patient and they are unchanged from previous note.  ALLERGIES:  is allergic to carboplatin, skin adhesives [cyanoacrylate], shrimp [shellfish allergy], sulfasalazine, sulfonamide derivatives, and telithromycin.  MEDICATIONS:  Current Outpatient Medications  Medication Sig Dispense Refill   ALPRAZolam (XANAX) 0.25 MG tablet Take 1 tablet (0.25 mg total) by mouth 2 (two) times daily as needed for anxiety or sleep. 60 tablet 0   Calcium Citrate (CAL-CITRATE PO) 1 poq     Calcium-Magnesium-Vitamin D 200-100-33.3  MG-MG-UNIT CAPS take one by mouth as needed Oral     carboxymethylcellulose (REFRESH PLUS) 0.5 % SOLN Place 1 drop into both eyes 2 (two) times daily as needed (dry eyes).     Cholecalciferol (VITAMIN D3) 25 MCG (1000 UT) CAPS Take 1,000 Units by mouth daily.      diphenhydrAMINE (BENADRYL ALLERGY) 25 MG tablet Take  by mouth.     escitalopram (LEXAPRO) 10 MG tablet 1 po qd Oral     esomeprazole (NEXIUM) 20 MG capsule Take 20 mg by mouth daily.      estradiol (ESTRACE VAGINAL) 0.1 MG/GM vaginal cream Place 1 applicatorful vaginally 3 (three) times a week. 42.5 g 12   lidocaine-prilocaine (EMLA) cream Apply topically as needed. 30 g 1   lisinopril (ZESTRIL) 10 MG tablet Take 1 tablet (10 mg total) by mouth daily.     loratadine (CLARITIN) 10 MG tablet Take 10 mg by mouth daily as needed for allergies.     Multiple Vitamins-Minerals (MULTI FOR HER PO) 1 poq     mupirocin ointment (BACTROBAN) 2 % 1 application Externally Twice a day     ondansetron (ZOFRAN) 8 MG tablet Take 1 tablet by mouth 2 times daily as needed. Start on the third day after chemotherapy. 30 tablet 1   pantoprazole (PROTONIX) 40 MG tablet 1 po qd on an empty stomach Oral     prochlorperazine (COMPAZINE) 10 MG tablet Take 1 tablet by mouth every 6 hours as needed (Nausea or vomiting). 30 tablet 1   No current facility-administered medications for this visit.   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 14.7 cm poorly defined soft tissue mass in central pelvis suspicious for primary ovarian carcinoma, with uterine leiomyosarcoma considered a less likely differential diagnosis. 2. Moderate ascites  and diffuse peritoneal carcinomatosis. 3. Several small uterine fibroids.   11/13/2018 Tumor Marker   Patient's tumor was tested for the following markers: CA-125 Results of the tumor marker test revealed 1434   11/21/2018 Procedure   Successful ultrasound-guided diagnostic and therapeutic paracentesis yielding 3.1 liters of peritoneal fluid   11/21/2018 Pathology Results   PERITONEAL/ASCITIC FLUID(SPECIMEN 1 OF 1 COLLECTED 11/21/18): ADENOCARCINOMA. Specimen Clinical Information Pelvic mass suspicious for ovarian cancer Source Peritoneal/Ascitic Fluid, (specimen 1 of 1 collected 11/21/18) Gross Specimen: Received is/are 1000 ccs of dark amber fluid. (CM:cm) Prepared: # Smears: 0 # Concentration Technique Slides (i.e. 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   11/29/2018 Initial Diagnosis   Ovarian cancer (HCC)   11/29/2018 Cancer Staging   Staging form: Ovary, Fallopian Tube, and Primary Peritoneal Carcinoma, AJCC 8th Edition - Clinical: cT3, cN0, cM0 - Signed by Artis Delay, MD on 11/29/2018   12/03/2018 Procedure   Successful ultrasound-guided therapeutic paracentesis yielding 3.7  liters of peritoneal fluid.     12/06/2018 Procedure   Placement of a subcutaneous port device. Catheter tip at the SVC and right atrium junction   12/14/2018 Procedure   Successful ultrasound-guided paracentesis yielding 2.9 L of peritoneal fluid   12/28/2018 Tumor Marker   Patient's tumor was tested for the following markers: CA-125 Results of the tumor marker test revealed 812.   12/28/2018 - 04/22/2019 Chemotherapy   The patient had carboplatin and taxol for neoadjuvant treatment, followed by interval debulking surgery and subsequent adjuvant chemotherapy treatment.     02/01/2019 Imaging   Ct abdomen and pelvis 8.6 cm left ovarian mass, corresponding to the patient's known primary neoplasm, improved.    Mild peritoneal nodularity/omental caking, improved.   Small abdominopelvic ascites, improved.     02/01/2019 Tumor Marker   Patient's tumor was tested for the following markers: CA-125 Results of the tumor marker test revealed 183.   02/12/2019 Surgery   Preoperative Diagnosis: Stage IIIC ovarian cancer, s/p neoadjuvant chemotherapy      Procedure(s) Performed: 1. Exploratory laparotomy with total abdominal hysterectomy, bilateral salpingo-oophorectomy, omentectomy radical tumor debulking for ovarian cancer.   Surgeon: Luisa Dago, MD.    Operative Findings: upper abdomen free of disease. No visible omental disease. Small volume (200cc) ascites. 8cm friable mass replacing left ovary and adherent to the sigmoid colon mesentery and ureter on the left.  Anterior fibroid. This represented an optimal cytoreduction (R0) with no gross visible disease remaining.    02/12/2019 Pathology Results   A. OVARY AND FALLOPIAN TUBE, LEFT, SALPINGO-OOPHORECTOMY: - Serous carcinoma, high grade, status post neoadjuvant therapy - See oncology table and comment below B. UTERUS CERVIX WITH RIGHT FALLOPIAN TUBE AND OVARY, HYSTERECTOMY: Uterus: - Serosal surface involved by serous carcinoma - Endomyometrium uninvolved by carcinoma - Benign endometrial polyp (4.1 cm) - Leiomyomata (5.5 cm; largest) - Adenomyosis Cervix: - Uninvolved by carcinoma Left ovary: - Serous carcinoma, high grade Left fallopian tube: - Serous carcinoma, high grade C. SOFT TISSUE, LEFT PELVIC SIDEWALL TUMOR, EXCISION: - Metastatic serous carcinoma, high-grade D. SOFT TISSUE, SIGMOID COLON MESENTERY, EXCISION: - Metastatic serous carcinoma, high-grade E. OMENTUM, TUMOR RESECTION: - Metastatic serous carcinoma, high-grade OVARY or FALLOPIAN TUBE or PRIMARY PERITONEUM: Procedure: Total hysterectomy and bilateral salpingo-oophorectomy, omentectomy and peritoneal biopsies Specimen Integrity: Fragmented Tumor Site: Left ovary  and fallopian tube Ovarian Surface Involvement: Present Fallopian Tube Surface Involvement: Present Tumor Size: 6.3 cm (see comment) Histologic Type: Serous carcinoma Histologic Grade: High-grade Implants: Not applicable Other Tissue/ Organ Involvement: Right ovary, right fallopian tube, omentum, mesentery Largest Extrapelvic Peritoneal Focus: Macroscopic Peritoneal/Ascitic Fluid: Malignant Treatment Effect: No definite or minimal response identified (chemotherapy response score 1 [CRS1] Regional Lymph Nodes: No lymph nodes submitted or found Pathologic Stage Classification (pTNM, AJCC 8th Edition): pT3c, pNX Representative Tumor Block: A4 Comment(s): Additional testing (HER-2, MMR and MSI) are pending. The primary tumor site appears to be the left ovary and fallopian tube. The uterus is only involved on the serosal surface. There is tumor on the anterior peritoneal reflection.  Addendum: Tumor is Her2 negative,MSI stable   02/19/2019 Tumor Marker   Patient's tumor was tested for the following markers: CA-125 Results of the tumor marker test revealed 40.3   04/01/2019 Tumor Marker   Patient's tumor was tested for the following markers: CA-125 Results of the tumor marker test revealed 8.1    Genetic Testing   Negative testing. No pathogenic variants identified on the [employer]. The report date is 04/17/2019.  Somatic genes analyzed through [facility]: ATM, BARD1, BRCA1, BRCA2, BRIP1, CHEK2, MRE11A, NBN, PALB2, RAD51C, RAD51D.  The [facility] gene panel offered by [employer] includes sequencing and rearrangement analysis for the following 36 genes: APC*, ATM*, AXIN2, BARD1, BMPR1A, BRCA1*, BRCA2*, BRIP1*, CDH1*, CDK4, CDKN2A, CHEK2*, DICER1, MLH1*, MSH2*, MSH3, MSH6*, MUTYH*, NBN, NF1*, NTHL1, PALB2*, PMS2*, PTEN*, RAD51C*, RAD51D*, RECQL, SMAD4, SMARCA4, STK11 and TP53* (sequencing and deletion/duplication); HOXB13, POLD1 and POLE (sequencing only); EPCAM  and GREM1 (deletion/duplication only).    05/21/2019 Imaging   1. Interval hysterectomy, oophorectomy and omentectomy. No evidence of residual ovarian carcinoma in the pelvis. No evidence of peritoneal disease. No intraperitoneal free fluid. 2. Nonobstructing LEFT renal calculi.  Normal ureters.   05/22/2019 Tumor Marker   Patient's tumor was tested for the following markers: CA-125 Results of the tumor marker test revealed 5.7.   06/03/2019 Procedure   Successful right IJ vein Port-A-Cath explant.   08/26/2019 Tumor Marker   Patient's tumor was tested for the following markers: CA-125. Results of the tumor marker test revealed 4.9   10/03/2019 Imaging   1. New tumor deposits along the capsular surfaces of the liver, spleen, in the left pelvis, and right paracolic gutter, compatible with metastatic disease. 2. Mild dilation of the dorsal pancreatic duct in the pancreatic body and head, cause uncertain. 3. Nonobstructive left nephrolithiasis. 4. Aortic atherosclerosis.   Aortic Atherosclerosis (ICD10-I70.0   10/10/2019 Procedure   Successful placement of a right IJ approach Power Port with ultrasound and fluoroscopic guidance. The catheter is ready for use.     10/11/2019 Tumor Marker   Patient's tumor was tested for the following markers: CA-125 Results of the tumor marker test revealed 19.6.   10/14/2019 -  Chemotherapy   The patient had carboplatin and gemzar for chemotherapy treatment.     11/04/2019 Tumor Marker   Patient's tumor was tested for the following markers: CA-125 Results of the tumor marker test revealed 11.4   11/28/2019 Tumor Marker   Patient's tumor was tested for the following markers: CA-125 Results of the tumor marker test revealed 5.8   12/20/2019 Imaging   1. Significant interval reduction in mixed solid and cystic nodule in the vicinity of the left ovary, with significant improvement or resolution of multiple pelvic, peritoneal, and organ capsule implants.  Resolution of previously seen small volume ascites. Findings are consistent with treatment response of abdominal metastatic disease. 2. Status post hysterectomy, oophorectomy, and omentectomy. 3. Nonobstructive left nephrolithiasis. 4. Aortic Atherosclerosis (ICD10-I70.0).   12/23/2019 Tumor Marker   Patient's tumor was tested for the following markers: CA-125 Results of the tumor marker test revealed 5.0   01/24/2020 Tumor Marker   Patient's tumor was tested for the following markers: CA-125 Results of the tumor marker test revealed 4.8   02/24/2020 Tumor Marker   Patient's tumor was tested for the following markers: CA-125. Results of the tumor marker test revealed 4.3.   03/06/2020 Imaging   1. Status post hysterectomy, bilateral oophorectomy, and omentectomy. 2. Resolution of previously described left adnexal nodularity. 3. No residual disease identified. 4.  Aortic Atherosclerosis (ICD10-I70.0). 5. Left nephrolithiasis.   04/20/2020 Tumor Marker   Patient's tumor was tested for the following markers: CA-125 Results of the tumor marker test revealed 4.6   05/18/2020 Tumor Marker   Patient's tumor was tested for the following markers: CA-125 Results of the tumor marker test revealed 4.7   05/29/2020 Imaging   Status post hysterectomy and bilateral salpingo oophorectomy.   No evidence of  recurrent or metastatic disease.   06/08/2020 - 09/13/2021 Chemotherapy   She was on niraparib       06/17/2020 Tumor Marker   Patient's tumor was tested for the following markers: CA-125 Results of the tumor marker test revealed 4.5   08/04/2020 Tumor Marker   Patient's tumor was tested for the following markers: CA-125. Results of the tumor marker test revealed 5.7   09/07/2020 Imaging   1. Status post hysterectomy and bilateral salpingo oophorectomy. No evidence of recurrent or metastatic disease. 2. Nonobstructing left nephrolithiasis. 3. Left-sided colonic diverticulosis without  findings of acute diverticulitis. 4. Aortic atherosclerosis.     09/07/2020 Tumor Marker   Patient's tumor was tested for the following markers: CA-125 Results of the tumor marker test revealed 4.8.   10/21/2020 Tumor Marker   Patient's tumor was tested for the following markers: CA-125. Results of the tumor marker test revealed 5.1.   12/14/2020 Tumor Marker   Patient's tumor was tested for the following markers: CA-125. Results of the tumor marker test revealed 5.5.   04/05/2021 Tumor Marker   Patient's tumor was tested for the following markers: CA-125. Results of the tumor marker test revealed 5.9.   05/31/2021 Tumor Marker   Patient's tumor was tested for the following markers: CA-125. Results of the tumor marker test revealed 8.1.   09/13/2021 Imaging   1. Interval development of multiple new cystic and solid lesions in the abdomen and pelvis measuring up to 10.3 x 7.2 cm and consistent with peritoneal metastatic disease. 2. Nonobstructing left renal stones. 3. Aortic Atherosclerosis (ICD10-I70.0).     09/14/2021 Tumor Marker   Patient's tumor was tested for the following markers: CA-125. Results of the tumor marker test revealed 28.5.   09/16/2021 Echocardiogram   1. Left ventricular ejection fraction, by estimation, is 60 to 65%. The left ventricle has normal function. The left ventricle has no regional wall motion abnormalities. Left ventricular diastolic parameters are consistent with Grade I diastolic dysfunction (impaired relaxation).  2. Right ventricular systolic function is normal. The right ventricular size is normal.  3. The mitral valve is normal in structure. Trivial mitral valve regurgitation. No evidence of mitral stenosis.  4. The aortic valve has an indeterminant number of cusps. Aortic valve regurgitation is trivial. No aortic stenosis is present.  5. The inferior vena cava is normal in size with greater than 50% respiratory variability, suggesting right atrial  pressure of 3 mmHg.     09/20/2021 - 01/17/2022 Chemotherapy   Patient is on Treatment Plan : OVARIAN RECURRENT Liposomal Doxorubicin + Carboplatin q28d X 6 Cycles     09/20/2021 - 02/22/2022 Chemotherapy   Patient is on Treatment Plan : OVARIAN RECURRENT Liposomal Doxorubicin + Carboplatin q28d X 6 Cycles     09/23/2021 Tumor Marker   Patient's tumor was tested for the following markers: CA-125. Results of the tumor marker test revealed 27.7.   11/24/2021 Tumor Marker   Patient's tumor was tested for the following markers: CA-125. Results of the tumor marker test revealed 28.4.   12/13/2021 Imaging   1. Overall stable peritoneal carcinomatosis. No new disease is demonstrated. 2. Moderate left-sided hydroureteronephrosis likely due to compression of the left ureter by the left pelvic mass. 3. Stable left-sided renal calculi. 4. Numerous left renal calculi.     12/22/2021 Tumor Marker   Patient's tumor was tested for the following markers: CA-125. Results of the tumor marker test revealed 24.7.   01/18/2022 Tumor Marker   Patient's tumor was  tested for the following markers: CA-125. Results of the tumor marker test revealed 9.9.   01/27/2022 Echocardiogram    1. Left ventricular ejection fraction, by estimation, is 60 to 65%. The left ventricle has normal function. The left ventricle has no regional wall motion abnormalities. Left ventricular diastolic parameters are consistent with Grade I diastolic dysfunction (impaired relaxation). The average left ventricular global longitudinal strain is -21.1 %. The global longitudinal strain is normal.  2. Right ventricular systolic function is normal. The right ventricular size is normal.  3. The mitral valve is abnormal. Mild mitral valve regurgitation.  4. The aortic valve is tricuspid. Aortic valve regurgitation is trivial.  5. The inferior vena cava is normal in size with greater than 50% respiratory variability, suggesting right atrial pressure of 3  mmHg.   02/23/2022 Tumor Marker   Patient's tumor was tested for the following markers: CA-125. Results of the tumor marker test revealed 8.5.   03/18/2022 Imaging   1. Diminished size of nodular and mixed solid and cystic peritoneal implants in the hepatorenal fossa, right lower quadrant, and left pelvis. 2. Unchanged solid mass in the right pelvis measuring 4.0 x 3.7 cm. 3. Constellation of findings is overall consistent with treatment response 4. Status post hysterectomy, oophorectomy, and omentectomy. 5. Nonobstructive left nephrolithiasis.   Aortic Atherosclerosis (ICD10-I70.0).     03/22/2022 - 08/16/2022 Chemotherapy   Patient is on Treatment Plan : Ovarian Bevacizumab q21d     03/24/2022 Tumor Marker   Patient's tumor was tested for the following markers: CA-125. Results of the tumor marker test revealed 7.1.   04/28/2022 Tumor Marker   Patient's tumor was tested for the following markers: CA-125. Results of the tumor marker test revealed 7.0.   05/27/2022 Tumor Marker   Patient's tumor was tested for the following markers: CA-125. Results of the tumor marker test revealed 7.7.   06/14/2022 Imaging   1. Slight response to therapy as evidenced by decrease in size of a cystic and solid mass in the left adnexa as well as peritoneal implants in the right paracolic gutter. Solid right adnexal mass and additional peritoneal implants along right hepatic lobe and in the left anatomic pelvis appear stable. 2. Hepatic steatosis. 3. Left renal stones. 4.  Aortic atherosclerosis (ICD10-I70.0).   06/17/2022 Tumor Marker   Patient's tumor was tested for the following markers: CA-125. Results of the tumor marker test revealed 11.2.   09/05/2022 Imaging   CT ABDOMEN PELVIS W CONTRAST  Result Date: 09/06/2022 CLINICAL DATA:  Ovarian cancer.  Restaging.  * Tracking Code: BO * EXAM: CT ABDOMEN AND PELVIS WITH CONTRAST TECHNIQUE: Multidetector CT imaging of the abdomen and pelvis was performed  using the standard protocol following bolus administration of intravenous contrast. RADIATION DOSE REDUCTION: This exam was performed according to the departmental dose-optimization program which includes automated exposure control, adjustment of the mA and/or kV according to patient size and/or use of iterative reconstruction technique. CONTRAST:  OMNIPAQUE IOHEXOL 300 MG/ML  SOLN COMPARISON:  06/13/2022 FINDINGS: Lower chest: Unremarkable. Hepatobiliary: No suspicious focal abnormality within the liver parenchyma. There is no evidence for gallstones, gallbladder wall thickening, or pericholecystic fluid. No intrahepatic or extrahepatic biliary dilation. Pancreas: No pancreatic mass lesion evident. Diffuse prominence of the main pancreatic duct is stable measuring up to 3-4 mm diameter in the head of the pancreas. Spleen: No splenomegaly. No focal mass lesion. Adrenals/Urinary Tract: No adrenal nodule or mass. Right kidney unremarkable. A cluster of nonobstructing stones in the upper  pole right kidney is again noted with dominant stone measuring about 6 mm in size. No evidence for hydroureter. The urinary bladder appears normal for the degree of distention. Stomach/Bowel: Tiny hiatal hernia. Stomach otherwise unremarkable. Duodenum is normally positioned as is the ligament of Treitz. No small bowel wall thickening. No small bowel dilatation. The terminal ileum is normal. The appendix is normal. No gross colonic mass. No colonic wall thickening. Diverticular changes are noted in the left colon without evidence of diverticulitis. Vascular/Lymphatic: There is mild atherosclerotic calcification of the abdominal aorta without aneurysm. There is no gastrohepatic or hepatoduodenal ligament lymphadenopathy. No retroperitoneal or mesenteric lymphadenopathy. No pelvic sidewall lymphadenopathy. Reproductive: Right-sided adnexal mass measured previously at 4.2 x 3.8 cm is now 4.9 x 4.6 cm. Cystic and solid left adnexal  lesion measured previously at 4.1 x 3.4 cm is now 3.2 x 2.9 cm. 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 The more anterior of the 2 index lesions measures 2.6 x 2.1 cm today compared to 2.4 x 1.6 cm previously. The more posterior lesion is 2.4 x 1.4 cm today compared to 2.0 x 1.9 cm previously. 5 mm soft tissue nodule identified in the inferior paracolic gutter previously at 5 mm measures 10 mm today on image 51/2. Implants along the left posterior peritoneum adjacent to the psoas muscle and iliac vessels appear progressive. 1.6 x 1.3 cm lesion visible on 58/2 was 1.1 x 0.8 cm previously (remeasured). No evidence for ascites. Musculoskeletal: No worrisome lytic or sclerotic osseous abnormality. IMPRESSION: 1. Overall generalized appearance of mild disease progression. 2. Index right-sided adnexal mass has progressed since the prior with interval decrease in size of the cystic and solid left adnexal lesion. 3. Interval progression of index capsular implants along the medial right liver. 4. Interval progression of soft tissue nodule in the inferior right paracolic gutter and peritoneal implants in the lower left abdomen and pelvis. 5. No evidence for ascites. 6. Nonobstructing left renal stones. 7. Tiny hiatal hernia. 8. Left colonic diverticulosis without diverticulitis. 9.  Aortic Atherosclerosis (ICD10-I70.0). Electronically Signed   By: Kennith Center M.D.   On: 09/06/2022 07:57      09/15/2022 - 03/31/2023 Chemotherapy   Patient is on Treatment Plan : OVARIAN Paclitaxel (175) q21d X 6 Cycles     11/16/2022 Imaging   CT ABDOMEN PELVIS W CONTRAST  Result Date: 11/16/2022 CLINICAL DATA:  Ovarian carcinoma. Assess treatment response. * Tracking Code: BO * EXAM: CT ABDOMEN AND PELVIS WITH CONTRAST TECHNIQUE: Multidetector CT imaging of the abdomen and pelvis was performed using the standard protocol following bolus administration of intravenous contrast. RADIATION DOSE REDUCTION: This  exam was performed according to the departmental dose-optimization program which includes automated exposure control, adjustment of the mA and/or kV according to patient size and/or use of iterative reconstruction technique. CONTRAST:  OMNIPAQUE IOHEXOL 300 MG/ML  SOLN COMPARISON:  None Available. FINDINGS: Lower chest: Lung bases are clear. Hepatobiliary: No focal hepatic lesion. Normal gallbladder. No biliary duct dilatation. Common bile duct is normal. Peritoneal implant described along the margin the RIGHT hepatic liver is much less conspicuous. Capsular lesion measuring 12 mm on image 14/2 is decreased from 26 mm. Pancreas: Pancreas is normal. No ductal dilatation. No pancreatic inflammation. Spleen: Normal spleen Adrenals/urinary tract: Adrenal glands normal. Multiple calculi within LEFT kidney without obstruction. Ureters and bladder normal. Stomach/Bowel: Stomach, small bowel, appendix, and cecum are normal. Multiple diverticula of the descending colon and sigmoid colon without acute inflammation. Vascular/Lymphatic:  Abdominal aorta is normal caliber. No periportal or retroperitoneal adenopathy. No pelvic adenopathy. Reproductive: Solid enhancing mass in the deep RIGHT pelvis adjacent the rectum measures 4.0 x 4.3 cm compared to 4.6 x 4.9 cm. Small adjacent satellite nodule measuring 1.1 cm (image 63) is unchanged. Cystic lesion of the LEFT adnexa with mural nodularity measures 3.0 x 2.4 cm compared to 3.2 x 2.9 cm. No new nodularity in the pelvis. Post hysterectomy. Other: Interval decrease in the peritoneal nodularity. For example peritoneal nodule adjacent to the cecum in the RIGHT iliac fossa measures 6 mm (image 43/2 compared to 10 mm on prior. Lesions on the LEFT iliac vessels less prominent. For example 7 mm lesion on 50/2 decreased from 16 mm. Musculoskeletal: No aggressive osseous lesion. IMPRESSION: 1. Interval decrease in size of RIGHT pelvic mass. 2. Interval decrease in size of LEFT  adnexal cystic mass. 3. Interval decrease in size of peritoneal nodularity. 4. Interval decrease in size of perihepatic peritoneal implant. 5. No new metastatic disease. Electronically Signed   By: Genevive Bi M.D.   On: 11/16/2022 14:34      02/08/2023 Imaging   CT ABDOMEN PELVIS W CONTRAST  Result Date: 02/08/2023 CLINICAL DATA:  Ovarian carcinoma.  Assess treatment response. EXAM: CT ABDOMEN AND PELVIS WITH CONTRAST TECHNIQUE: Multidetector CT imaging of the abdomen and pelvis was performed using the standard protocol following bolus administration of intravenous contrast. RADIATION DOSE REDUCTION: This exam was performed according to the departmental dose-optimization program which includes automated exposure control, adjustment of the mA and/or kV according to patient size and/or use of iterative reconstruction technique. CONTRAST:  OMNIPAQUE IOHEXOL 300 MG/ML  SOLN COMPARISON:  CT 11/14/2022 FINDINGS: Lower chest: Lung bases are clear. Hepatobiliary: No focal hepatic lesion. Normal gallbladder. No biliary duct dilatation. Common bile duct is normal. Small peritoneal implant along the RIGHT hepatic lobe is less conspicuous at 6 mm (image 18/2) compared to 11 mm on prior. Pancreas: Pancreas is normal. No ductal dilatation. No pancreatic inflammation. Spleen: Normal spleen Adrenals/urinary tract: Adrenal glands normal. Multiple nonobstructing calculi in the upper pole of the LEFT kidney. Ureters and bladder normal. Stomach/Bowel: Stomach, small bowel, appendix, and cecum are normal. The colon and rectosigmoid colon are normal. Vascular/Lymphatic: Abdominal aorta is normal caliber with atherosclerotic calcification. There is no retroperitoneal or periportal lymphadenopathy. No pelvic lymphadenopathy. Reproductive: Post hysterectomy anatomy. Again demonstrated rounded mass posterior to the vaginal cuff just RIGHT of midline measures 3.7 by 3.0 cm (image 69/series 2) compared to 4.3 x 4.0 cm on most  recent CT. Part cystic and solid lesion in the LEFT adnexa measures 2.2 x 1.6 cm (image 65) compared to 3.0 x 2.2 cm. Previously measured peritoneal implant in the deep RIGHT pelvis is no longer identified. No new mass lesions within the pelvis. Other: No new peritoneal disease.  No free fluid. Musculoskeletal: No aggressive osseous lesion. IMPRESSION: 1. Interval decrease in size rounded mass in the deep RIGHT pelvis. 2. Interval decrease in size of cystic LEFT adnexal lesion. 3. Decrease in size of peritoneal implants. 4. No evidence of new metastatic disease. No intraperitoneal free fluid Electronically Signed   By: Genevive Bi M.D.   On: 02/08/2023 10:56      04/10/2023 Imaging   CT ABDOMEN PELVIS W CONTRAST  Result Date: 04/13/2023 CLINICAL DATA:  Ovarian cancer.  Restaging.  * Tracking Code: BO * EXAM: CT ABDOMEN AND PELVIS WITH CONTRAST TECHNIQUE: Multidetector CT imaging of the abdomen and pelvis was performed using the  standard protocol following bolus administration of intravenous contrast. RADIATION DOSE REDUCTION: This exam was performed according to the departmental dose-optimization program which includes automated exposure control, adjustment of the mA and/or kV according to patient size and/or use of iterative reconstruction technique. CONTRAST:  OMNIPAQUE IOHEXOL 300 MG/ML  SOLN COMPARISON:  01/31/2023 FINDINGS: Lower chest: No acute findings. Hepatobiliary: No suspicious focal abnormality within the liver parenchyma. There is no evidence for gallstones, gallbladder wall thickening, or pericholecystic fluid. No intrahepatic or extrahepatic biliary dilation. Pancreas: No focal mass lesion. No dilatation of the main duct. No intraparenchymal cyst. No peripancreatic edema. Spleen: No splenomegaly. No suspicious focal mass lesion. Adrenals/Urinary Tract: No adrenal nodule or mass. Right kidney unremarkable. Clustered nonobstructing stones are seen in the upper pole left kidney  measuring up to 7 mm diameter. No evidence for hydroureter. The urinary bladder appears normal for the degree of distention. Stomach/Bowel: Stomach is unremarkable. No gastric wall thickening. No evidence of outlet obstruction. Duodenum is normally positioned as is the ligament of Treitz. No small bowel wall thickening. No small bowel dilatation. The terminal ileum is normal. The appendix is normal. No gross colonic mass. No colonic wall thickening. Diverticular changes are noted in the left colon without evidence of diverticulitis. Vascular/Lymphatic: There is mild atherosclerotic calcification of the abdominal aorta without aneurysm. There is no gastrohepatic or hepatoduodenal ligament lymphadenopathy. No retroperitoneal or mesenteric lymphadenopathy. No pelvic sidewall lymphadenopathy. Reproductive: Uterus surgically absent. Right paramidline pelvic mass lesion again evident. This is just posterior to the right vaginal cuff measuring 4.2 x 4.2 cm on image 68/2 today which compares to 3.7 x 3.1 cm previously. Cystic lesion in the left adnexal space is also progressive measuring 2.5 x 1.9 cm today compared to 2.2 x 1.6 cm previously. There is some enhancing soft tissue along the antro inferior aspect of this lesion (see image 65/2). No new mesenteric or peritoneal lesion evident. Other: No intraperitoneal free fluid. Musculoskeletal: No worrisome lytic or sclerotic osseous abnormality. IMPRESSION: 1. Interval progression of the right pelvic lesion along the vaginal cuff measuring 4.2 x 4.2 cm today compared to 3.7 x 3.1 cm previously. Cystic lesion in the left adnexal space is also progressive measuring 2.5 x 1.9 cm today compared to 2.2 x 1.6 cm previously. 2. No new mesenteric or peritoneal lesion evident. 3. Clustered nonobstructing stones in the upper pole left kidney measuring up to 7 mm diameter. 4. Left colonic diverticulosis without diverticulitis. 5.  Aortic Atherosclerosis (ICD10-I70.0). Electronically  Signed   By: Kennith Center M.D.   On: 04/13/2023 14:04   DG Chest 2 View  Result Date: 03/22/2023 CLINICAL DATA:  Cough EXAM: CHEST - 2 VIEW COMPARISON:  None Available. FINDINGS: Right-sided chest port terminates at the level of the distal SVC. The heart size and mediastinal contours are within normal limits. Both lungs are clear. The visualized skeletal structures are unremarkable. IMPRESSION: No active cardiopulmonary disease. Electronically Signed   By: Duanne Guess D.O.   On: 03/22/2023 16:29       04/18/2023 Echocardiogram       1. Left ventricular ejection fraction, by estimation, is 60 to 65%. The left ventricle has normal function. The left ventricle has no regional wall motion abnormalities. There is mild left ventricular hypertrophy. Left ventricular diastolic parameters  are consistent with Grade I diastolic dysfunction (impaired relaxation). The average left ventricular global longitudinal strain is -19.3 %. The global longitudinal strain is normal.  2. Right ventricular systolic function is normal. The right  ventricular size is normal. Tricuspid regurgitation signal is inadequate for assessing PA pressure.  3. The mitral valve is normal in structure. Trivial mitral valve regurgitation. No evidence of mitral stenosis.  4. The aortic valve was not well visualized. Aortic valve regurgitation is trivial. No aortic stenosis is present.  5. The inferior vena cava is normal in size with greater than 50% respiratory variability, suggesting right atrial pressure of 3 mmHg.   04/26/2023 -  Chemotherapy   Patient is on Treatment Plan : OVARIAN Liposomal Doxorubicin (40) q28d       PHYSICAL EXAMINATION: ECOG PERFORMANCE STATUS: 1 - Symptomatic but completely ambulatory  Vitals:   06/22/23 0830  BP: (!) 152/81  Pulse: 80  Resp: 18  SpO2: 98%   Filed Weights   06/22/23 0830  Weight: 148 lb 12.8 oz (67.5 kg)    GENERAL:alert, no distress and comfortable SKIN: Noted  erythematous changes to her hands but no fissures or blisters  LABORATORY DATA:  I have reviewed the data as listed    Component Value Date/Time   NA 139 06/22/2023 0801   K 3.7 06/22/2023 0801   CL 105 06/22/2023 0801   CO2 29 06/22/2023 0801   GLUCOSE 98 06/22/2023 0801   BUN 12 06/22/2023 0801   CREATININE 0.55 06/22/2023 0801   CALCIUM 9.4 06/22/2023 0801   PROT 6.2 (L) 06/22/2023 0801   ALBUMIN 3.9 06/22/2023 0801   AST 26 06/22/2023 0801   ALT 21 06/22/2023 0801   ALKPHOS 93 06/22/2023 0801   BILITOT 1.2 06/22/2023 0801   GFRNONAA >60 06/22/2023 0801   GFRAA >60 02/24/2020 0852    No results found for: "SPEP", "UPEP"  Lab Results  Component Value Date   WBC 4.7 06/22/2023   NEUTROABS 2.8 06/22/2023   HGB 12.0 06/22/2023   HCT 35.7 (L) 06/22/2023   MCV 94.9 06/22/2023   PLT 231 06/22/2023      Chemistry      Component Value Date/Time   NA 139 06/22/2023 0801   K 3.7 06/22/2023 0801   CL 105 06/22/2023 0801   CO2 29 06/22/2023 0801   BUN 12 06/22/2023 0801   CREATININE 0.55 06/22/2023 0801      Component Value Date/Time   CALCIUM 9.4 06/22/2023 0801   ALKPHOS 93 06/22/2023 0801   AST 26 06/22/2023 0801   ALT 21 06/22/2023 0801   BILITOT 1.2 06/22/2023 0801

## 2023-06-22 NOTE — Patient Instructions (Signed)
[facility] - A DEPT OF [facility]  Discharge Instructions: Thank you for choosing [facility] to provide your oncology and hematology care.   If you have a lab appointment with the Cancer Center, please go directly to the Cancer Center and check in at the registration area.   Wear comfortable clothing and clothing appropriate for easy access to any Portacath or PICC line.   We strive to give you quality time with your provider. You may need to reschedule your appointment if you arrive late (15 or more minutes).  Arriving late affects you and other patients whose appointments are after yours.  Also, if you miss three or more appointments without notifying the office, you may be dismissed from the clinic at the provider's discretion.      For prescription refill requests, have your pharmacy contact our office and allow 72 hours for refills to be completed.    Today you received the following chemotherapy and/or immunotherapy agents: Doxil      To help prevent nausea and vomiting after your treatment, we encourage you to take your nausea medication as directed.  BELOW ARE SYMPTOMS THAT SHOULD BE REPORTED IMMEDIATELY: *FEVER GREATER THAN 100.4 F (38 C) OR HIGHER *CHILLS OR SWEATING *NAUSEA AND VOMITING THAT IS NOT CONTROLLED WITH YOUR NAUSEA MEDICATION *UNUSUAL SHORTNESS OF BREATH *UNUSUAL BRUISING OR BLEEDING *URINARY PROBLEMS (pain or burning when urinating, or frequent urination) *BOWEL PROBLEMS (unusual diarrhea, constipation, pain near the anus) TENDERNESS IN MOUTH AND THROAT WITH OR WITHOUT PRESENCE OF ULCERS (sore throat, sores in mouth, or a toothache) UNUSUAL RASH, SWELLING OR PAIN  UNUSUAL VAGINAL DISCHARGE OR ITCHING   Items with * indicate a potential emergency and should be followed up as soon as possible or go to the Emergency Department if any problems should occur.  Please show the CHEMOTHERAPY ALERT CARD or IMMUNOTHERAPY  ALERT CARD at check-in to the Emergency Department and triage nurse.  Should you have questions after your visit or need to cancel or reschedule your appointment, please contact [facility] - A DEPT OF [facility]  Dept: [phone]  and follow the prompts.  Office hours are 8:00 a.m. to 4:30 p.m. Monday - Friday. Please note that voicemails left after 4:00 p.m. may not be returned until the following business day.  We are closed weekends and major holidays. You have access to a nurse at all times for urgent questions. Please call the main number to the clinic Dept: [phone] and follow the prompts.   For any non-urgent questions, you may also contact your provider using MyChart. We now offer e-Visits for anyone 29 and older to request care online for non-urgent symptoms. For details visit [email or web address].   Also download the MyChart app! Go to the app store, search "MyChart", open the app, select [facility], and log in with your MyChart username and password.

## 2023-06-22 NOTE — Assessment & Plan Note (Signed)
Due to side effects of treatment We discussed importance of vigilant skin care

## 2023-06-30 ENCOUNTER — Telehealth: Payer: Self-pay

## 2023-06-30 NOTE — Telephone Encounter (Signed)
 Called and left a message asking her to call the office back to schedule a earlier appt with Dr. Marton Sleeper on 07/13/23.

## 2023-06-30 NOTE — Telephone Encounter (Signed)
 Recommend move CT to be done sooner

## 2023-06-30 NOTE — Telephone Encounter (Signed)
 Called and given radiology scheduling #. She will call to move CT earlier. She will start yogurt. Told her the office will call back with earlier appt with Dr. Marton Sleeper.

## 2023-06-30 NOTE — Telephone Encounter (Signed)
 Returned call to [place]. 3 days ago she started having intermittent abdominal cramping with eating and gas pains. She started the [employer] Denies constipation. Having bm daily. Denies nausea/ vomiting. She denies taking laxatives. She is able to eat and drink. Denies dehydration.  She is asking what you recommend? She has taken probiotics in the past, should she resume probiotic? She thinks a stomach virus may be going around and maybe it is just a stomach virus.

## 2023-07-05 ENCOUNTER — Telehealth: Payer: Self-pay

## 2023-07-05 NOTE — Telephone Encounter (Signed)
Pt called and states she has not noticed relief from abd cramping per conversation 2/7. She reports the cramping is excruciating and happens every time she eats. She has been following a BRAT diet. States when the cramps come on they bring her to tears and reports the cramps are to her left lover abd. Following the pain comes a BM but reports they are small and look "normal" in color. She states she is passing flatulence but very little and states the pains to resemble gas pains, but worse. Advised pt to try gas X in the meantime and would route her concern to MD for further review. She is in agreement.

## 2023-07-06 ENCOUNTER — Encounter: Payer: Self-pay | Admitting: Hematology and Oncology

## 2023-07-06 ENCOUNTER — Other Ambulatory Visit: Payer: Self-pay | Admitting: Hematology and Oncology

## 2023-07-06 DIAGNOSIS — C562 Malignant neoplasm of left ovary: Secondary | ICD-10-CM

## 2023-07-06 NOTE — Progress Notes (Signed)
err

## 2023-07-06 NOTE — Telephone Encounter (Signed)
Called and offered appt today with abdominal xray prior to appt. She declined appt due to feeling better. She took 1 gas-x yesterday and her pain/ cramping is better. She is following the [employer] and drinking with no problems. She will take gas-x if needed and call the office back if needed. She will keep CT appt scheduled for tomorrow.

## 2023-07-06 NOTE — Telephone Encounter (Signed)
Stacy Rivas, please call her I can see her today Get her to go to radiology for STAT abdomen X ray 1 hour before her appt I will place order

## 2023-07-07 ENCOUNTER — Ambulatory Visit (HOSPITAL_COMMUNITY)
Admission: RE | Admit: 2023-07-07 | Discharge: 2023-07-07 | Disposition: A | Discharge status: Home / Self Care | Payer: Medicare Other | Source: Ambulatory Visit | Attending: Hematology and Oncology | Admitting: Hematology and Oncology

## 2023-07-07 ENCOUNTER — Encounter (HOSPITAL_COMMUNITY): Payer: Self-pay

## 2023-07-07 DIAGNOSIS — T50905A Adverse effect of unspecified drugs, medicaments and biological substances, initial encounter: Secondary | ICD-10-CM

## 2023-07-07 DIAGNOSIS — C562 Malignant neoplasm of left ovary: Secondary | ICD-10-CM

## 2023-07-07 DIAGNOSIS — I158 Other secondary hypertension: Secondary | ICD-10-CM | POA: Insufficient documentation

## 2023-07-07 MED ORDER — IOHEXOL 350 MG/ML SOLN: 100.0000 mL | Freq: Once | INTRAVENOUS | Status: DC | PRN

## 2023-07-07 MED ORDER — IOHEXOL 300 MG/ML  SOLN
100.0000 mL | Freq: Once | INTRAMUSCULAR | Status: AC | PRN
  Administered 2023-07-07: 100 mL via INTRAVENOUS

## 2023-07-10 ENCOUNTER — Other Ambulatory Visit: Payer: Self-pay | Admitting: Hematology and Oncology

## 2023-07-10 ENCOUNTER — Telehealth: Payer: Self-pay

## 2023-07-10 ENCOUNTER — Other Ambulatory Visit (HOSPITAL_COMMUNITY): Payer: Self-pay

## 2023-07-10 MED ORDER — OXYCODONE HCL 5 MG PO TABS
5.0000 mg | ORAL_TABLET | ORAL | 0 refills | Status: DC | PRN
  Filled 2023-07-10: qty 30, 5d supply, fill #0

## 2023-07-10 NOTE — Telephone Encounter (Signed)
Called and told her Rx sent to [facility]. She verbalized understanding.

## 2023-07-10 NOTE — Telephone Encounter (Signed)
Pain medicine sent to [facility]

## 2023-07-10 NOTE — Telephone Encounter (Signed)
Returned her call. She is complaining of constant back pain that started late yesterday. She is applying heating pad to back. Denies injury/fall. Denies vomiting/ nausea.  She is following BRAT diet and eating very little today. She is passing liquid stool only at times and does not remember the last time she had a good bm. She thinks that she may be constipated. She will take Senokot-s to see how she feels. She is taking tylenol prn for the pain.  Called reading room for stat read on CT. She is asking for pain medication to [facility].

## 2023-07-11 ENCOUNTER — Telehealth: Payer: Self-pay

## 2023-07-11 ENCOUNTER — Encounter: Payer: Self-pay | Admitting: Hematology and Oncology

## 2023-07-11 ENCOUNTER — Other Ambulatory Visit (HOSPITAL_COMMUNITY): Payer: Self-pay

## 2023-07-11 ENCOUNTER — Inpatient Hospital Stay: Payer: Medicare Other | Attending: Gynecologic Oncology | Admitting: Hematology and Oncology

## 2023-07-11 VITALS — BP 152/81 | HR 94 | Temp 97.9°F | Resp 18 | Ht 62.0 in | Wt 144.8 lb

## 2023-07-11 DIAGNOSIS — C562 Malignant neoplasm of left ovary: Secondary | ICD-10-CM | POA: Diagnosis present

## 2023-07-11 DIAGNOSIS — G620 Drug-induced polyneuropathy: Secondary | ICD-10-CM | POA: Diagnosis not present

## 2023-07-11 DIAGNOSIS — C786 Secondary malignant neoplasm of retroperitoneum and peritoneum: Secondary | ICD-10-CM | POA: Diagnosis not present

## 2023-07-11 DIAGNOSIS — K5909 Other constipation: Secondary | ICD-10-CM | POA: Diagnosis not present

## 2023-07-11 DIAGNOSIS — T451X5A Adverse effect of antineoplastic and immunosuppressive drugs, initial encounter: Secondary | ICD-10-CM | POA: Diagnosis not present

## 2023-07-11 DIAGNOSIS — Z7189 Other specified counseling: Secondary | ICD-10-CM

## 2023-07-11 MED ORDER — ONDANSETRON HCL 8 MG PO TABS
8.0000 mg | ORAL_TABLET | Freq: Three times a day (TID) | ORAL | 1 refills | Status: DC | PRN
  Filled 2023-07-11: qty 30, 10d supply, fill #0

## 2023-07-11 MED ORDER — DEXAMETHASONE 4 MG PO TABS
ORAL_TABLET | ORAL | 1 refills | Status: DC
  Filled 2023-07-11: qty 30, 7d supply, fill #0

## 2023-07-11 NOTE — Assessment & Plan Note (Signed)
Counseled and coordinated advanced care planning today.  Patient is aware disease is incurable. Treatment offered is palliative in nature. We discussed a realistic and effective plan that will be reviewed and updated on an ongoing basis as indicated. We discussed the following: Risks, benefits and alternatives to the various treatment options Discussed patient's personal belief/values/goals Discussed palliative care options, ways to avoid hospitalization and patient's desire for care if decision making capacity is affected Discussed MOST form, Advanced Directive and Code Status; she desire DNR CODE STATUS She will bring a copy of her advance directive to be scanned

## 2023-07-11 NOTE — Assessment & Plan Note (Signed)
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 2003 Feb;88(2):130-5. 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 Rose PG1, Blessing JA, [place] HG, [place], Dalton D, [place] [facility]. Abstract OBJECTIVES:  Docetaxel is an inhibitor of microtubule depolymerization and has demonstrated activity in paclitaxel-resistant breast cancer and gynecologic cancer. The Gynecologic Oncology Group (GOG) conducted a study of docetaxel in paclitaxel-resistant ovarian and peritoneal carcinoma to determine its activity, and nature and degree of toxicity, in this cohort of patients. METHODS:  Patients with platinum- and paclitaxel-resistant ovarian or peritoneal carcinoma, defined as progression while on or within 6 months of therapy, were eligible if they had measurable disease and had not received more than one chemotherapy regimen. Docetaxel at a dose of 100 mg/m(2) was administered iv over 1 h every 21 days. A prophylactic regimen of oral dexamethasone 8 mg bid was begun 24 h before docetaxel administration and continued for 48 h thereafter. Hepatic function was strictly monitored. RESULTS:  Sixty patients were entered and treated with a total of 256 courses, with all 60 evaluable for toxicity and 58 evaluable for response. Responses were observed in 22.4% of patients, with 5.2% achieving complete response and 17.2% achieving partial response (95% CI, 12.5-35.3%). The median duration of response was 2.5 months. The likelihood of observing a response did not appear to be related to the length of the prior paclitaxel-free interval  or duration of prior paclitaxel infusions. The principal adverse effect of grade 4 neutropenia occurred in 75% of patients. There was one treatment-related death. Dose reductions were required in 36% of patients. CONCLUSIONS:  Docetaxel is active in paclitaxel-resistant ovarian and peritoneal cancer but, in view of significant hematologic toxicity, further study is warranted to ascertain its optimal dose and schedule.  We discussed the role of chemotherapy. The intent is for palliative.  We discussed some of the risks, benefits, side-effects of Docetaxel.   Some of the short term side-effects included, though not limited to, risk of severe allergic reaction, fatigue, weight loss, pancytopenia, life-threatening infections, need for transfusions of blood products, nausea, vomiting, change in bowel habits, loss of hair, admission to hospital for various reasons, and risks of death.   Long term side-effects are also discussed including risks of infertility, permanent damage to nerve function, chronic fatigue, and rare secondary malignancy including bone marrow disorders.   The patient is aware that the response rates discussed earlier is not guaranteed.   After a long discussion, patient made an informed decision to proceed with the prescribed plan of care.   Patient education material was dispensed Due to anticipated high risks of neurotoxicity and neutropenia, I plan to prescribe mild dose reduction instead of 100 mg/m2 as described above I do not plan prophylactic G-CSF support

## 2023-07-11 NOTE — Assessment & Plan Note (Signed)
Due to pre-existing peripheral neuropathy, I plan upfront dose reduction We discussed cryotherapy

## 2023-07-11 NOTE — Progress Notes (Signed)
DISCONTINUE ON PATHWAY REGIMEN - Ovarian     A cycle is every 28 days:     Bevacizumab-xxxx      Liposomal doxorubicin   **Always confirm dose/schedule in your pharmacy ordering system**  PRIOR TREATMENT: [id number]: Liposomal Doxorubicin (Doxil) 40 mg/m2 D1 + Bevacizumab 10 mg/kg D1, 15 q28 Days; Re-evaluate Every 3 Cycles, Treat until Complete Response, Unacceptable Toxicity, or Disease Progression  START OFF PATHWAY REGIMEN - Ovarian   [id number]:Docetaxel 100 mg/m2 IV D1 q21 Days:   A cycle is every 21 days:     Docetaxel   **Always confirm dose/schedule in your pharmacy ordering system**  Patient Characteristics: Recurrent or Progressive Disease, Fourth Line and Beyond, Low, Medium, or Unknown Folate Receptor Alpha Expression OR  Prior Mirvetuximab OR Not a Candidate for Mirvetuximab, HER2 Expression Equivocal/Negative/Unknown (IHC ? 2+) BRCA Mutation Status: Absent Therapeutic Status: Recurrent or Progressive Disease Line of Therapy: Fourth Line and Beyond Folate Receptor Alpha (FR?) Expression: Low Mirvetuximab Candidacy: Not a Candidate for Mirvetuximab OR Prior Mirvetuximab HER2 Expression Status by IHC: Negative (IHC 0, 1+) Intent of Therapy: Non-Curative / Palliative Intent, Discussed with Patient

## 2023-07-11 NOTE — Assessment & Plan Note (Signed)
The large pelvic masses is [id number] severe constipation I recommend aggressive laxative therapy

## 2023-07-11 NOTE — Progress Notes (Signed)
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 2003 Feb;88(2):130-5. 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 Rose PG1, Blessing JA, [place] HG, [place], Lacey D, [facility]. Abstract OBJECTIVES:  Docetaxel is an inhibitor of microtubule depolymerization and has demonstrated activity in paclitaxel-resistant breast cancer and gynecologic cancer. The Gynecologic Oncology Group (GOG) conducted a study of docetaxel in paclitaxel-resistant ovarian and peritoneal carcinoma to determine its activity, and nature and degree of toxicity, in this cohort of patients. METHODS:  Patients with platinum- and paclitaxel-resistant ovarian or peritoneal carcinoma, defined as progression while on or within 6 months of therapy, were eligible if they had measurable disease and had not received more than one chemotherapy regimen. Docetaxel at a dose of 100 mg/m(2) was administered iv over 1 h every 21 days. A prophylactic regimen of oral dexamethasone 8 mg bid was begun 24 h before docetaxel administration and continued for 48 h thereafter. Hepatic function was strictly monitored. RESULTS:  Sixty patients were entered and treated with a total of 256 courses, with all 60 evaluable for toxicity and 58 evaluable for response. Responses were observed in 22.4% of patients, with 5.2% achieving complete  response and 17.2% achieving partial response (95% CI, 12.5-35.3%). The median duration of response was 2.5 months. The likelihood of observing a response did not appear to be related to the length of the prior paclitaxel-free interval or duration of prior paclitaxel infusions. The principal adverse effect of grade 4 neutropenia occurred in 75% of patients. There was one treatment-related death. Dose reductions were required in 36% of patients. CONCLUSIONS:  Docetaxel is active in paclitaxel-resistant ovarian and peritoneal cancer but, in view of significant hematologic toxicity, further study is warranted to ascertain its optimal dose and schedule.  We discussed the role of chemotherapy. The intent is for palliative.  We discussed some of the risks, benefits, side-effects of Docetaxel.   Some of the short term side-effects included, though not limited to, risk of severe allergic reaction, fatigue, weight loss, pancytopenia, life-threatening infections, need for transfusions of blood products, nausea, vomiting, change in bowel habits, loss of hair, admission to hospital for various reasons, and risks of death.   Long term side-effects are also discussed including risks of infertility, permanent damage to nerve function, chronic fatigue, and rare secondary malignancy including bone marrow disorders.   The patient is aware that the response rates discussed earlier is not guaranteed.   After a long discussion, patient made an informed decision to proceed with the prescribed plan of care.   Patient education material was dispensed Due to anticipated high risks of neurotoxicity and neutropenia, I plan to prescribe mild dose reduction instead of 100 mg/m2 as described above I do not plan prophylactic G-CSF support   Peripheral neuropathy due to chemotherapy [facility]) Due to pre-existing peripheral neuropathy, I plan upfront dose reduction We discussed cryotherapy  Other constipation The large pelvic  masses is causing severe constipation I recommend aggressive laxative  therapy  Goals of care, counseling/discussion Counseled and coordinated advanced care planning today.  Patient is aware disease is incurable. Treatment offered is palliative in nature. We discussed a realistic and effective plan that will be reviewed and updated on an ongoing basis as indicated. We discussed the following: Risks, benefits and alternatives to the various treatment options Discussed patient's personal belief/values/goals Discussed palliative care options, ways to avoid hospitalization and patient's desire for care if decision making capacity is affected Discussed MOST form, Advanced Directive and Code Status; she desire DNR CODE STATUS She will bring a copy of her advance directive to be scanned   Orders Placed This Encounter  Procedures   CBC with Differential (Cancer Center Only)    Standing Status:   Future    Expected Date:   07/20/2023    Expiration Date:   07/19/2024   CMP (Cancer Center only)    Standing Status:   Future    Expected Date:   07/20/2023    Expiration Date:   07/19/2024   CBC with Differential (Cancer Center Only)    Standing Status:   Future    Expected Date:   08/10/2023    Expiration Date:   08/09/2024   CMP (Cancer Center only)    Standing Status:   Future    Expected Date:   08/10/2023    Expiration Date:   08/09/2024   CBC with Differential (Cancer Center Only)    Standing Status:   Future    Expected Date:   08/31/2023    Expiration Date:   08/30/2024   CMP (Cancer Center only)    Standing Status:   Future    Expected Date:   08/31/2023    Expiration Date:   08/30/2024   CBC with Differential (Cancer Center Only)    Standing Status:   Future    Expected Date:   09/21/2023    Expiration Date:   09/20/2024   CMP (Cancer Center only)    Standing Status:   Future    Expected Date:   09/21/2023    Expiration Date:   09/20/2024    All questions were answered. The patient knows to  call the clinic with any problems, questions or concerns. The total time spent in the appointment was 55 minutes encounter with patients including review of chart and various tests results, discussions about plan of care and coordination of care plan   Artis Delay, MD 07/11/2023 3:34 PM  INTERVAL HISTORY: Please see below for problem oriented charting. she returns for CT imaging review She has constipation for the past 4 days She has been taking intermittent doses of MiraLAX and Senokot She has some pain Oxycodone seems to alleviate her pain We reviewed imaging studies and discussed plan of care  REVIEW OF SYSTEMS:   Constitutional: Denies fevers, chills or abnormal weight loss Eyes: Denies blurriness of vision Ears, nose, mouth, throat, and face: Denies mucositis or sore throat Respiratory: Denies cough, dyspnea or wheezes Cardiovascular: Denies palpitation, chest discomfort or lower extremity swelling Skin: Denies abnormal skin rashes Lymphatics: Denies new lymphadenopathy or easy bruising Neurological:Denies numbness, tingling or new weaknesses Behavioral/Psych: Mood is stable, no new changes  All other systems were reviewed with the patient and are negative.  I have reviewed the past medical history, past surgical history, social history and family history with the patient and they are unchanged from previous note.  ALLERGIES:  is allergic to carboplatin, skin adhesives [cyanoacrylate], shrimp [shellfish allergy], sulfasalazine, sulfonamide derivatives, and  telithromycin.  MEDICATIONS:  Current Outpatient Medications  Medication Sig Dispense Refill   polyethylene glycol (MIRALAX / GLYCOLAX) 17 g packet Take 17 g by mouth 2 (two) times daily.     senna (SENOKOT) 8.6 MG tablet Take 2 tablets by mouth 3 (three) times daily.     ALPRAZolam (XANAX) 0.25 MG tablet Take 1 tablet (0.25 mg total) by mouth 2 (two) times daily as needed for anxiety or sleep. 60 tablet 0   Calcium Citrate  (CAL-CITRATE PO) 1 poq     Calcium-Magnesium-Vitamin D 200-100-33.3 MG-MG-UNIT CAPS take one by mouth as needed Oral     carboxymethylcellulose (REFRESH PLUS) 0.5 % SOLN Place 1 drop into both eyes 2 (two) times daily as needed (dry eyes).     Cholecalciferol (VITAMIN D3) 25 MCG (1000 UT) CAPS Take 1,000 Units by mouth daily.      dexamethasone (DECADRON) 4 MG tablet Take 2 tabs by mouth starting day before chemo. Then take 2 tabs daily for 2 days starting day after chemo. Take with food. 30 tablet 1   diphenhydrAMINE (BENADRYL ALLERGY) 25 MG tablet Take by mouth.     escitalopram (LEXAPRO) 10 MG tablet 1 po qd Oral     esomeprazole (NEXIUM) 20 MG capsule Take 20 mg by mouth daily.      estradiol (ESTRACE VAGINAL) 0.1 MG/GM vaginal cream Place 1 applicatorful vaginally 3 (three) times a week. 42.5 g 12   lidocaine-prilocaine (EMLA) cream Apply topically as needed. 30 g 1   lisinopril (ZESTRIL) 10 MG tablet Take 1 tablet (10 mg total) by mouth daily.     loratadine (CLARITIN) 10 MG tablet Take 10 mg by mouth daily as needed for allergies.     Multiple Vitamins-Minerals (MULTI FOR HER PO) 1 poq     mupirocin ointment (BACTROBAN) 2 % 1 application Externally Twice a day     ondansetron (ZOFRAN) 8 MG tablet Take 1 tablet by mouth 2 times daily as needed. Start on the third day after chemotherapy. 30 tablet 1   ondansetron (ZOFRAN) 8 MG tablet Take 1 tablet (8 mg total) by mouth every 8 (eight) hours as needed for nausea or vomiting. 30 tablet 1   oxyCODONE (OXY IR/ROXICODONE) 5 MG immediate release tablet Take 1 tablet (5 mg total) by mouth every 4 (four) hours as needed for severe pain (pain score 7-10). 30 tablet 0   pantoprazole (PROTONIX) 40 MG tablet 1 po qd on an empty stomach Oral     prochlorperazine (COMPAZINE) 10 MG tablet Take 1 tablet by mouth every 6 hours as needed (Nausea or vomiting). 30 tablet 1   No current facility-administered medications for this visit.    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 14.7 cm poorly defined soft tissue mass in central pelvis suspicious for primary ovarian carcinoma, with uterine leiomyosarcoma considered a less likely differential diagnosis. 2. Moderate ascites and diffuse peritoneal carcinomatosis. 3. Several small uterine fibroids.   11/13/2018 Tumor Marker   Patient's tumor was tested for the following markers: CA-125 Results of the tumor marker test revealed 1434   11/21/2018 Procedure   Successful ultrasound-guided diagnostic and therapeutic paracentesis yielding 3.1 liters of peritoneal fluid   11/21/2018 Pathology Results   PERITONEAL/ASCITIC FLUID(SPECIMEN  1 OF 1 COLLECTED 11/21/18): ADENOCARCINOMA. Specimen Clinical Information Pelvic mass suspicious for ovarian cancer Source Peritoneal/Ascitic Fluid, (specimen 1 of 1 collected 11/21/18) Gross Specimen: Received is/are 1000 ccs of dark amber fluid. (CM:cm) Prepared: # Smears: 0 # Concentration Technique Slides (i.e. 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   11/29/2018 Initial Diagnosis   Ovarian cancer (HCC)   11/29/2018 Cancer Staging   Staging form: Ovary, Fallopian Tube, and Primary Peritoneal Carcinoma, AJCC 8th Edition - Clinical: cT3, cN0, cM0 - Signed by Artis Delay, MD on 11/29/2018   12/03/2018 Procedure   Successful ultrasound-guided therapeutic paracentesis yielding 3.7 liters of peritoneal fluid.     12/06/2018 Procedure   Placement of a subcutaneous port device. Catheter tip at the SVC and right atrium junction   12/14/2018 Procedure   Successful ultrasound-guided paracentesis yielding 2.9 L of  peritoneal fluid   12/28/2018 Tumor Marker   Patient's tumor was tested for the following markers: CA-125 Results of the tumor marker test revealed 812.   12/28/2018 - 04/22/2019 Chemotherapy   The patient had carboplatin and taxol for neoadjuvant treatment, followed by interval debulking surgery and subsequent adjuvant chemotherapy treatment.     02/01/2019 Imaging   Ct abdomen and pelvis 8.6 cm left ovarian mass, corresponding to the patient's known primary neoplasm, improved.   Mild peritoneal nodularity/omental caking, improved.   Small abdominopelvic ascites, improved.     02/01/2019 Tumor Marker   Patient's tumor was tested for the following markers: CA-125 Results of the tumor marker test revealed 183.   02/12/2019 Surgery   Preoperative Diagnosis: Stage IIIC ovarian cancer, s/p neoadjuvant chemotherapy      Procedure(s) Performed: 1. Exploratory laparotomy with total abdominal hysterectomy, bilateral salpingo-oophorectomy, omentectomy radical tumor debulking for ovarian cancer.   Surgeon: Luisa Dago, MD.    Operative Findings: upper abdomen free of disease. No visible omental disease. Small volume (200cc) ascites. 8cm friable mass replacing left ovary and adherent to the sigmoid colon mesentery and ureter on the left.  Anterior fibroid. This represented an optimal cytoreduction (R0) with no gross visible disease remaining.    02/12/2019 Pathology Results   A. OVARY AND FALLOPIAN TUBE, LEFT, SALPINGO-OOPHORECTOMY: - Serous carcinoma, high grade, status post neoadjuvant therapy - See oncology table and comment below B. UTERUS CERVIX WITH RIGHT FALLOPIAN TUBE AND OVARY, HYSTERECTOMY: Uterus: - Serosal surface involved by serous carcinoma - Endomyometrium uninvolved by carcinoma - Benign endometrial polyp (4.1 cm) - Leiomyomata (5.5 cm; largest) - Adenomyosis Cervix: - Uninvolved by carcinoma Left ovary: - Serous carcinoma, high grade Left fallopian tube: - Serous  carcinoma, high grade C. SOFT TISSUE, LEFT PELVIC SIDEWALL TUMOR, EXCISION: - Metastatic serous carcinoma, high-grade D. SOFT TISSUE, SIGMOID COLON MESENTERY, EXCISION: - Metastatic serous carcinoma, high-grade E. OMENTUM, TUMOR RESECTION: - Metastatic serous carcinoma, high-grade OVARY or FALLOPIAN TUBE or PRIMARY PERITONEUM: Procedure: Total hysterectomy and bilateral salpingo-oophorectomy, omentectomy and peritoneal biopsies Specimen Integrity: Fragmented Tumor Site: Left ovary and fallopian tube Ovarian Surface Involvement: Present Fallopian Tube Surface Involvement: Present Tumor Size: 6.3 cm (see comment) Histologic Type: Serous carcinoma Histologic Grade: High-grade Implants: Not applicable Other Tissue/ Organ Involvement: Right ovary, right fallopian tube, omentum, mesentery Largest Extrapelvic Peritoneal Focus: Macroscopic Peritoneal/Ascitic Fluid: Malignant Treatment Effect: No definite or minimal response identified (chemotherapy response score 1 [CRS1] Regional Lymph Nodes: No lymph nodes submitted or found Pathologic Stage Classification (pTNM, AJCC 8th Edition): pT3c, pNX Representative Tumor Block: A4  Comment(s): Additional testing (HER-2, MMR and MSI) are pending. The primary tumor site appears to be the left ovary and fallopian tube. The uterus is only involved on the serosal surface. There is tumor on the anterior peritoneal reflection.  Addendum: Tumor is Her2 negative,MSI stable   02/19/2019 Tumor Marker   Patient's tumor was tested for the following markers: CA-125 Results of the tumor marker test revealed 40.3   04/01/2019 Tumor Marker   Patient's tumor was tested for the following markers: CA-125 Results of the tumor marker test revealed 8.1    Genetic Testing   Negative testing. No pathogenic variants identified on the [employer]. The report date is 04/17/2019.  Somatic genes analyzed through [facility]: ATM, BARD1, BRCA1,  BRCA2, BRIP1, CHEK2, MRE11A, NBN, PALB2, RAD51C, RAD51D.  The [facility] gene panel offered by [employer] includes sequencing and rearrangement analysis for the following 36 genes: APC*, ATM*, AXIN2, BARD1, BMPR1A, BRCA1*, BRCA2*, BRIP1*, CDH1*, CDK4, CDKN2A, CHEK2*, DICER1, MLH1*, MSH2*, MSH3, MSH6*, MUTYH*, NBN, NF1*, NTHL1, PALB2*, PMS2*, PTEN*, RAD51C*, RAD51D*, RECQL, SMAD4, SMARCA4, STK11 and TP53* (sequencing and deletion/duplication); HOXB13, POLD1 and POLE (sequencing only); EPCAM and GREM1 (deletion/duplication only).    05/21/2019 Imaging   1. Interval hysterectomy, oophorectomy and omentectomy. No evidence of residual ovarian carcinoma in the pelvis. No evidence of peritoneal disease. No intraperitoneal free fluid. 2. Nonobstructing LEFT renal calculi.  Normal ureters.   05/22/2019 Tumor Marker   Patient's tumor was tested for the following markers: CA-125 Results of the tumor marker test revealed 5.7.   06/03/2019 Procedure   Successful right IJ vein Port-A-Cath explant.   08/26/2019 Tumor Marker   Patient's tumor was tested for the following markers: CA-125. Results of the tumor marker test revealed 4.9   10/03/2019 Imaging   1. New tumor deposits along the capsular surfaces of the liver, spleen, in the left pelvis, and right paracolic gutter, compatible with metastatic disease. 2. Mild dilation of the dorsal pancreatic duct in the pancreatic body and head, cause uncertain. 3. Nonobstructive left nephrolithiasis. 4. Aortic atherosclerosis.   Aortic Atherosclerosis (ICD10-I70.0   10/10/2019 Procedure   Successful placement of a right IJ approach Power Port with ultrasound and fluoroscopic guidance. The catheter is ready for use.     10/11/2019 Tumor Marker   Patient's tumor was tested for the following markers: CA-125 Results of the tumor marker test revealed 19.6.   10/14/2019 -  Chemotherapy   The patient had carboplatin and gemzar for chemotherapy treatment.      11/04/2019 Tumor Marker   Patient's tumor was tested for the following markers: CA-125 Results of the tumor marker test revealed 11.4   11/28/2019 Tumor Marker   Patient's tumor was tested for the following markers: CA-125 Results of the tumor marker test revealed 5.8   12/20/2019 Imaging   1. Significant interval reduction in mixed solid and cystic nodule in the vicinity of the left ovary, with significant improvement or resolution of multiple pelvic, peritoneal, and organ capsule implants. Resolution of previously seen small volume ascites. Findings are consistent with treatment response of abdominal metastatic disease. 2. Status post hysterectomy, oophorectomy, and omentectomy. 3. Nonobstructive left nephrolithiasis. 4. Aortic Atherosclerosis (ICD10-I70.0).   12/23/2019 Tumor Marker   Patient's tumor was tested for the following markers: CA-125 Results of the tumor marker test revealed 5.0   01/24/2020 Tumor Marker   Patient's tumor was tested for the following markers: CA-125 Results of the tumor marker test revealed 4.8   02/24/2020 Tumor Marker  Patient's tumor was tested for the following markers: CA-125. Results of the tumor marker test revealed 4.3.   03/06/2020 Imaging   1. Status post hysterectomy, bilateral oophorectomy, and omentectomy. 2. Resolution of previously described left adnexal nodularity. 3. No residual disease identified. 4.  Aortic Atherosclerosis (ICD10-I70.0). 5. Left nephrolithiasis.   04/20/2020 Tumor Marker   Patient's tumor was tested for the following markers: CA-125 Results of the tumor marker test revealed 4.6   05/18/2020 Tumor Marker   Patient's tumor was tested for the following markers: CA-125 Results of the tumor marker test revealed 4.7   05/29/2020 Imaging   Status post hysterectomy and bilateral salpingo oophorectomy.   No evidence of recurrent or metastatic disease.   06/08/2020 - 09/13/2021 Chemotherapy   She was on niraparib        06/17/2020 Tumor Marker   Patient's tumor was tested for the following markers: CA-125 Results of the tumor marker test revealed 4.5   08/04/2020 Tumor Marker   Patient's tumor was tested for the following markers: CA-125. Results of the tumor marker test revealed 5.7   09/07/2020 Imaging   1. Status post hysterectomy and bilateral salpingo oophorectomy. No evidence of recurrent or metastatic disease. 2. Nonobstructing left nephrolithiasis. 3. Left-sided colonic diverticulosis without findings of acute diverticulitis. 4. Aortic atherosclerosis.     09/07/2020 Tumor Marker   Patient's tumor was tested for the following markers: CA-125 Results of the tumor marker test revealed 4.8.   10/21/2020 Tumor Marker   Patient's tumor was tested for the following markers: CA-125. Results of the tumor marker test revealed 5.1.   12/14/2020 Tumor Marker   Patient's tumor was tested for the following markers: CA-125. Results of the tumor marker test revealed 5.5.   04/05/2021 Tumor Marker   Patient's tumor was tested for the following markers: CA-125. Results of the tumor marker test revealed 5.9.   05/31/2021 Tumor Marker   Patient's tumor was tested for the following markers: CA-125. Results of the tumor marker test revealed 8.1.   09/13/2021 Imaging   1. Interval development of multiple new cystic and solid lesions in the abdomen and pelvis measuring up to 10.3 x 7.2 cm and consistent with peritoneal metastatic disease. 2. Nonobstructing left renal stones. 3. Aortic Atherosclerosis (ICD10-I70.0).     09/14/2021 Tumor Marker   Patient's tumor was tested for the following markers: CA-125. Results of the tumor marker test revealed 28.5.   09/16/2021 Echocardiogram   1. Left ventricular ejection fraction, by estimation, is 60 to 65%. The left ventricle has normal function. The left ventricle has no regional wall motion abnormalities. Left ventricular diastolic parameters are consistent with Grade  I diastolic dysfunction (impaired relaxation).  2. Right ventricular systolic function is normal. The right ventricular size is normal.  3. The mitral valve is normal in structure. Trivial mitral valve regurgitation. No evidence of mitral stenosis.  4. The aortic valve has an indeterminant number of cusps. Aortic valve regurgitation is trivial. No aortic stenosis is present.  5. The inferior vena cava is normal in size with greater than 50% respiratory variability, suggesting right atrial pressure of 3 mmHg.     09/20/2021 - 01/17/2022 Chemotherapy   Patient is on Treatment Plan : OVARIAN RECURRENT Liposomal Doxorubicin + Carboplatin q28d X 6 Cycles     09/20/2021 - 02/22/2022 Chemotherapy   Patient is on Treatment Plan : OVARIAN RECURRENT Liposomal Doxorubicin + Carboplatin q28d X 6 Cycles     09/23/2021 Tumor Marker   Patient's tumor  was tested for the following markers: CA-125. Results of the tumor marker test revealed 27.7.   11/24/2021 Tumor Marker   Patient's tumor was tested for the following markers: CA-125. Results of the tumor marker test revealed 28.4.   12/13/2021 Imaging   1. Overall stable peritoneal carcinomatosis. No new disease is demonstrated. 2. Moderate left-sided hydroureteronephrosis likely due to compression of the left ureter by the left pelvic mass. 3. Stable left-sided renal calculi. 4. Numerous left renal calculi.     12/22/2021 Tumor Marker   Patient's tumor was tested for the following markers: CA-125. Results of the tumor marker test revealed 24.7.   01/18/2022 Tumor Marker   Patient's tumor was tested for the following markers: CA-125. Results of the tumor marker test revealed 9.9.   01/27/2022 Echocardiogram    1. Left ventricular ejection fraction, by estimation, is 60 to 65%. The left ventricle has normal function. The left ventricle has no regional wall motion abnormalities. Left ventricular diastolic parameters are consistent with Grade I diastolic dysfunction  (impaired relaxation). The average left ventricular global longitudinal strain is -21.1 %. The global longitudinal strain is normal.  2. Right ventricular systolic function is normal. The right ventricular size is normal.  3. The mitral valve is abnormal. Mild mitral valve regurgitation.  4. The aortic valve is tricuspid. Aortic valve regurgitation is trivial.  5. The inferior vena cava is normal in size with greater than 50% respiratory variability, suggesting right atrial pressure of 3 mmHg.   02/23/2022 Tumor Marker   Patient's tumor was tested for the following markers: CA-125. Results of the tumor marker test revealed 8.5.   03/18/2022 Imaging   1. Diminished size of nodular and mixed solid and cystic peritoneal implants in the hepatorenal fossa, right lower quadrant, and left pelvis. 2. Unchanged solid mass in the right pelvis measuring 4.0 x 3.7 cm. 3. Constellation of findings is overall consistent with treatment response 4. Status post hysterectomy, oophorectomy, and omentectomy. 5. Nonobstructive left nephrolithiasis.   Aortic Atherosclerosis (ICD10-I70.0).     03/22/2022 - 08/16/2022 Chemotherapy   Patient is on Treatment Plan : Ovarian Bevacizumab q21d     03/24/2022 Tumor Marker   Patient's tumor was tested for the following markers: CA-125. Results of the tumor marker test revealed 7.1.   04/28/2022 Tumor Marker   Patient's tumor was tested for the following markers: CA-125. Results of the tumor marker test revealed 7.0.   05/27/2022 Tumor Marker   Patient's tumor was tested for the following markers: CA-125. Results of the tumor marker test revealed 7.7.   06/14/2022 Imaging   1. Slight response to therapy as evidenced by decrease in size of a cystic and solid mass in the left adnexa as well as peritoneal implants in the right paracolic gutter. Solid right adnexal mass and additional peritoneal implants along right hepatic lobe and in the left anatomic pelvis appear  stable. 2. Hepatic steatosis. 3. Left renal stones. 4.  Aortic atherosclerosis (ICD10-I70.0).   06/17/2022 Tumor Marker   Patient's tumor was tested for the following markers: CA-125. Results of the tumor marker test revealed 11.2.   09/05/2022 Imaging   CT ABDOMEN PELVIS W CONTRAST  Result Date: 09/06/2022 CLINICAL DATA:  Ovarian cancer.  Restaging.  * Tracking Code: BO * EXAM: CT ABDOMEN AND PELVIS WITH CONTRAST TECHNIQUE: Multidetector CT imaging of the abdomen and pelvis was performed using the standard protocol following bolus administration of intravenous contrast. RADIATION DOSE REDUCTION: This exam was performed according to the departmental  dose-optimization program which includes automated exposure control, adjustment of the mA and/or kV according to patient size and/or use of iterative reconstruction technique. CONTRAST:  OMNIPAQUE IOHEXOL 300 MG/ML  SOLN COMPARISON:  06/13/2022 FINDINGS: Lower chest: Unremarkable. Hepatobiliary: No suspicious focal abnormality within the liver parenchyma. There is no evidence for gallstones, gallbladder wall thickening, or pericholecystic fluid. No intrahepatic or extrahepatic biliary dilation. Pancreas: No pancreatic mass lesion evident. Diffuse prominence of the main pancreatic duct is stable measuring up to 3-4 mm diameter in the head of the pancreas. Spleen: No splenomegaly. No focal mass lesion. Adrenals/Urinary Tract: No adrenal nodule or mass. Right kidney unremarkable. A cluster of nonobstructing stones in the upper pole right kidney is again noted with dominant stone measuring about 6 mm in size. No evidence for hydroureter. The urinary bladder appears normal for the degree of distention. Stomach/Bowel: Tiny hiatal hernia. Stomach otherwise unremarkable. Duodenum is normally positioned as is the ligament of Treitz. No small bowel wall thickening. No small bowel dilatation. The terminal ileum is normal. The appendix is normal. No gross colonic  mass. No colonic wall thickening. Diverticular changes are noted in the left colon without evidence of diverticulitis. Vascular/Lymphatic: There is mild atherosclerotic calcification of the abdominal aorta without aneurysm. There is no gastrohepatic or hepatoduodenal ligament lymphadenopathy. No retroperitoneal or mesenteric lymphadenopathy. No pelvic sidewall lymphadenopathy. Reproductive: Right-sided adnexal mass measured previously at 4.2 x 3.8 cm is now 4.9 x 4.6 cm. Cystic and solid left adnexal lesion measured previously at 4.1 x 3.4 cm is now 3.2 x 2.9 cm. 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 The more anterior of the 2 index lesions measures 2.6 x 2.1 cm today compared to 2.4 x 1.6 cm previously. The more posterior lesion is 2.4 x 1.4 cm today compared to 2.0 x 1.9 cm previously. 5 mm soft tissue nodule identified in the inferior paracolic gutter previously at 5 mm measures 10 mm today on image 51/2. Implants along the left posterior peritoneum adjacent to the psoas muscle and iliac vessels appear progressive. 1.6 x 1.3 cm lesion visible on 58/2 was 1.1 x 0.8 cm previously (remeasured). No evidence for ascites. Musculoskeletal: No worrisome lytic or sclerotic osseous abnormality. IMPRESSION: 1. Overall generalized appearance of mild disease progression. 2. Index right-sided adnexal mass has progressed since the prior with interval decrease in size of the cystic and solid left adnexal lesion. 3. Interval progression of index capsular implants along the medial right liver. 4. Interval progression of soft tissue nodule in the inferior right paracolic gutter and peritoneal implants in the lower left abdomen and pelvis. 5. No evidence for ascites. 6. Nonobstructing left renal stones. 7. Tiny hiatal hernia. 8. Left colonic diverticulosis without diverticulitis. 9.  Aortic Atherosclerosis (ICD10-I70.0). Electronically Signed   By: Kennith Center M.D.   On: 09/06/2022 07:57       09/15/2022 - 03/31/2023 Chemotherapy   Patient is on Treatment Plan : OVARIAN Paclitaxel (175) q21d X 6 Cycles     11/16/2022 Imaging   CT ABDOMEN PELVIS W CONTRAST  Result Date: 11/16/2022 CLINICAL DATA:  Ovarian carcinoma. Assess treatment response. * Tracking Code: BO * EXAM: CT ABDOMEN AND PELVIS WITH CONTRAST TECHNIQUE: Multidetector CT imaging of the abdomen and pelvis was performed using the standard protocol following bolus administration of intravenous contrast. RADIATION DOSE REDUCTION: This exam was performed according to the departmental dose-optimization program which includes automated exposure control, adjustment of the mA and/or kV according to patient size and/or use  of iterative reconstruction technique. CONTRAST:  OMNIPAQUE IOHEXOL 300 MG/ML  SOLN COMPARISON:  None Available. FINDINGS: Lower chest: Lung bases are clear. Hepatobiliary: No focal hepatic lesion. Normal gallbladder. No biliary duct dilatation. Common bile duct is normal. Peritoneal implant described along the margin the RIGHT hepatic liver is much less conspicuous. Capsular lesion measuring 12 mm on image 14/2 is decreased from 26 mm. Pancreas: Pancreas is normal. No ductal dilatation. No pancreatic inflammation. Spleen: Normal spleen Adrenals/urinary tract: Adrenal glands normal. Multiple calculi within LEFT kidney without obstruction. Ureters and bladder normal. Stomach/Bowel: Stomach, small bowel, appendix, and cecum are normal. Multiple diverticula of the descending colon and sigmoid colon without acute inflammation. Vascular/Lymphatic: Abdominal aorta is normal caliber. No periportal or retroperitoneal adenopathy. No pelvic adenopathy. Reproductive: Solid enhancing mass in the deep RIGHT pelvis adjacent the rectum measures 4.0 x 4.3 cm compared to 4.6 x 4.9 cm. Small adjacent satellite nodule measuring 1.1 cm (image 63) is unchanged. Cystic lesion of the LEFT adnexa with mural nodularity measures 3.0 x 2.4 cm compared  to 3.2 x 2.9 cm. No new nodularity in the pelvis. Post hysterectomy. Other: Interval decrease in the peritoneal nodularity. For example peritoneal nodule adjacent to the cecum in the RIGHT iliac fossa measures 6 mm (image 43/2 compared to 10 mm on prior. Lesions on the LEFT iliac vessels less prominent. For example 7 mm lesion on 50/2 decreased from 16 mm. Musculoskeletal: No aggressive osseous lesion. IMPRESSION: 1. Interval decrease in size of RIGHT pelvic mass. 2. Interval decrease in size of LEFT adnexal cystic mass. 3. Interval decrease in size of peritoneal nodularity. 4. Interval decrease in size of perihepatic peritoneal implant. 5. No new metastatic disease. Electronically Signed   By: Genevive Bi M.D.   On: 11/16/2022 14:34      02/08/2023 Imaging   CT ABDOMEN PELVIS W CONTRAST  Result Date: 02/08/2023 CLINICAL DATA:  Ovarian carcinoma.  Assess treatment response. EXAM: CT ABDOMEN AND PELVIS WITH CONTRAST TECHNIQUE: Multidetector CT imaging of the abdomen and pelvis was performed using the standard protocol following bolus administration of intravenous contrast. RADIATION DOSE REDUCTION: This exam was performed according to the departmental dose-optimization program which includes automated exposure control, adjustment of the mA and/or kV according to patient size and/or use of iterative reconstruction technique. CONTRAST:  OMNIPAQUE IOHEXOL 300 MG/ML  SOLN COMPARISON:  CT 11/14/2022 FINDINGS: Lower chest: Lung bases are clear. Hepatobiliary: No focal hepatic lesion. Normal gallbladder. No biliary duct dilatation. Common bile duct is normal. Small peritoneal implant along the RIGHT hepatic lobe is less conspicuous at 6 mm (image 18/2) compared to 11 mm on prior. Pancreas: Pancreas is normal. No ductal dilatation. No pancreatic inflammation. Spleen: Normal spleen Adrenals/urinary tract: Adrenal glands normal. Multiple nonobstructing calculi in the upper pole of the LEFT kidney. Ureters and  bladder normal. Stomach/Bowel: Stomach, small bowel, appendix, and cecum are normal. The colon and rectosigmoid colon are normal. Vascular/Lymphatic: Abdominal aorta is normal caliber with atherosclerotic calcification. There is no retroperitoneal or periportal lymphadenopathy. No pelvic lymphadenopathy. Reproductive: Post hysterectomy anatomy. Again demonstrated rounded mass posterior to the vaginal cuff just RIGHT of midline measures 3.7 by 3.0 cm (image 69/series 2) compared to 4.3 x 4.0 cm on most recent CT. Part cystic and solid lesion in the LEFT adnexa measures 2.2 x 1.6 cm (image 65) compared to 3.0 x 2.2 cm. Previously measured peritoneal implant in the deep RIGHT pelvis is no longer identified. No new mass lesions within the pelvis. Other: No  new peritoneal disease.  No free fluid. Musculoskeletal: No aggressive osseous lesion. IMPRESSION: 1. Interval decrease in size rounded mass in the deep RIGHT pelvis. 2. Interval decrease in size of cystic LEFT adnexal lesion. 3. Decrease in size of peritoneal implants. 4. No evidence of new metastatic disease. No intraperitoneal free fluid Electronically Signed   By: Genevive Bi M.D.   On: 02/08/2023 10:56      04/10/2023 Imaging   CT ABDOMEN PELVIS W CONTRAST  Result Date: 04/13/2023 CLINICAL DATA:  Ovarian cancer.  Restaging.  * Tracking Code: BO * EXAM: CT ABDOMEN AND PELVIS WITH CONTRAST TECHNIQUE: Multidetector CT imaging of the abdomen and pelvis was performed using the standard protocol following bolus administration of intravenous contrast. RADIATION DOSE REDUCTION: This exam was performed according to the departmental dose-optimization program which includes automated exposure control, adjustment of the mA and/or kV according to patient size and/or use of iterative reconstruction technique. CONTRAST:  OMNIPAQUE IOHEXOL 300 MG/ML  SOLN COMPARISON:  01/31/2023 FINDINGS: Lower chest: No acute findings. Hepatobiliary: No suspicious focal  abnormality within the liver parenchyma. There is no evidence for gallstones, gallbladder wall thickening, or pericholecystic fluid. No intrahepatic or extrahepatic biliary dilation. Pancreas: No focal mass lesion. No dilatation of the main duct. No intraparenchymal cyst. No peripancreatic edema. Spleen: No splenomegaly. No suspicious focal mass lesion. Adrenals/Urinary Tract: No adrenal nodule or mass. Right kidney unremarkable. Clustered nonobstructing stones are seen in the upper pole left kidney measuring up to 7 mm diameter. No evidence for hydroureter. The urinary bladder appears normal for the degree of distention. Stomach/Bowel: Stomach is unremarkable. No gastric wall thickening. No evidence of outlet obstruction. Duodenum is normally positioned as is the ligament of Treitz. No small bowel wall thickening. No small bowel dilatation. The terminal ileum is normal. The appendix is normal. No gross colonic mass. No colonic wall thickening. Diverticular changes are noted in the left colon without evidence of diverticulitis. Vascular/Lymphatic: There is mild atherosclerotic calcification of the abdominal aorta without aneurysm. There is no gastrohepatic or hepatoduodenal ligament lymphadenopathy. No retroperitoneal or mesenteric lymphadenopathy. No pelvic sidewall lymphadenopathy. Reproductive: Uterus surgically absent. Right paramidline pelvic mass lesion again evident. This is just posterior to the right vaginal cuff measuring 4.2 x 4.2 cm on image 68/2 today which compares to 3.7 x 3.1 cm previously. Cystic lesion in the left adnexal space is also progressive measuring 2.5 x 1.9 cm today compared to 2.2 x 1.6 cm previously. There is some enhancing soft tissue along the antro inferior aspect of this lesion (see image 65/2). No new mesenteric or peritoneal lesion evident. Other: No intraperitoneal free fluid. Musculoskeletal: No worrisome lytic or sclerotic osseous abnormality. IMPRESSION: 1. Interval  progression of the right pelvic lesion along the vaginal cuff measuring 4.2 x 4.2 cm today compared to 3.7 x 3.1 cm previously. Cystic lesion in the left adnexal space is also progressive measuring 2.5 x 1.9 cm today compared to 2.2 x 1.6 cm previously. 2. No new mesenteric or peritoneal lesion evident. 3. Clustered nonobstructing stones in the upper pole left kidney measuring up to 7 mm diameter. 4. Left colonic diverticulosis without diverticulitis. 5.  Aortic Atherosclerosis (ICD10-I70.0). Electronically Signed   By: Kennith Center M.D.   On: 04/13/2023 14:04   DG Chest 2 View  Result Date: 03/22/2023 CLINICAL DATA:  Cough EXAM: CHEST - 2 VIEW COMPARISON:  None Available. FINDINGS: Right-sided chest port terminates at the level of the distal SVC. The heart size and mediastinal contours are  within normal limits. Both lungs are clear. The visualized skeletal structures are unremarkable. IMPRESSION: No active cardiopulmonary disease. Electronically Signed   By: Duanne Guess D.O.   On: 03/22/2023 16:29       04/18/2023 Echocardiogram       1. Left ventricular ejection fraction, by estimation, is 60 to 65%. The left ventricle has normal function. The left ventricle has no regional wall motion abnormalities. There is mild left ventricular hypertrophy. Left ventricular diastolic parameters  are consistent with Grade I diastolic dysfunction (impaired relaxation). The average left ventricular global longitudinal strain is -19.3 %. The global longitudinal strain is normal.  2. Right ventricular systolic function is normal. The right ventricular size is normal. Tricuspid regurgitation signal is inadequate for assessing PA pressure.  3. The mitral valve is normal in structure. Trivial mitral valve regurgitation. No evidence of mitral stenosis.  4. The aortic valve was not well visualized. Aortic valve regurgitation is trivial. No aortic stenosis is present.  5. The inferior vena cava is normal in size  with greater than 50% respiratory variability, suggesting right atrial pressure of 3 mmHg.   04/26/2023 - 06/22/2023 Chemotherapy   Patient is on Treatment Plan : OVARIAN Liposomal Doxorubicin (40) q28d     07/07/2023 Imaging   CT CHEST ABDOMEN PELVIS W CONTRAST Result Date: 07/10/2023 CLINICAL DATA:  History of recurrent ovarian cancer, follow-up. * Tracking Code: BO * EXAM: CT CHEST, ABDOMEN, AND PELVIS WITH CONTRAST TECHNIQUE: Multidetector CT imaging of the chest, abdomen and pelvis was performed following the standard protocol during bolus administration of intravenous contrast. RADIATION DOSE REDUCTION: This exam was performed according to the departmental dose-optimization program which includes automated exposure control, adjustment of the mA and/or kV according to patient size and/or use of iterative reconstruction technique. CONTRAST:  OMNIPAQUE IOHEXOL 300 MG/ML  SOLN COMPARISON:  Multiple priors including CT April 10, 2023 FINDINGS: CT CHEST FINDINGS Cardiovascular: Right chest Port-A-Cath with tip near the superior cavoatrial junction. Normal caliber thoracic aorta. Normal size heart. Mediastinum/Nodes: No suspicious thyroid nodule. No pathologically enlarged mediastinal, hilar or axillary lymph nodes. Esophagus is grossly unremarkable. Lungs/Pleura: Biapical pleuroparenchymal scarring. 4 mm left lower lobe pulmonary nodule on image 62/6 stable from coronary CT Oct 06, 2021 suggestive of a benign finding. No new suspicious pulmonary nodules or masses. Musculoskeletal: No aggressive lytic or blastic lesion of bone. CT ABDOMEN PELVIS FINDINGS Hepatobiliary: New heterogeneous 2.3 cm subcapsular segment VI hepatic lesion on image 57/2 and 14 mm segment VI/VII hepatic lesion on image 55/2. Gallbladder is unremarkable.  No biliary ductal dilation. Pancreas: No pancreatic ductal dilation or evidence of acute inflammation. Spleen: No splenomegaly. Adrenals/Urinary Tract: No suspicious adrenal  nodule/mass. Hydronephrosis. Nonobstructive left renal stones measure up to 6 mm. Kidneys demonstrate symmetric enhancement. Mild symmetric wall thickening of an incompletely distended urinary bladder. Stomach/Bowel: Stomach is unremarkable for degree of distension. No pathologic dilation of small or large bowel. No evidence of acute bowel inflammation. Vascular/Lymphatic: Normal caliber abdominal aorta. Aortic atherosclerosis. Smooth IVC contours. The portal, splenic and superior mesenteric veins are patent. Increased size of the low-density left external iliac lymph node now measuring 14 mm in short axis on image 93/2 previously 7 mm. Reproductive: Increased size of the heterogeneous lesion in the arising from the right vaginal cuff now measuring 4.9 x 3.4 cm on image 105/2 previously 2.5 x 1.9 cm. Left ovarian cystic lesion with persistent enhancing soft tissue along the margin of the lesion for instance on image 101/2 now in  total measures 5.3 x 5.1 cm on image 108/2 previously 4.2 x 4.2 cm. Other: Increased abdominopelvic free fluid with a new loculated collection in the pelvis. Few new scattered soft tissue nodules in the omentum/peritoneum for instance along the right pericolic gutter measuring 7 mm on image 93/2 and in the left upper quadrant measuring 5 mm on image 53/2. Musculoskeletal: No aggressive lytic or blastic lesion of bone. IMPRESSION: 1. Increased size of the heterogeneous lesion arising from the right vaginal cuff, increased size of the left ovarian cystic lesion with persistent enhancing soft tissue along the margin of the lesion, and increased size of the low-density left external iliac lymph node, compatible with worsening disease. 2. Increased abdominopelvic free fluid with a new loculated collection in the pelvis and a few new scattered soft tissue nodules in the omentum/peritoneum, compatible with peritoneal carcinomatosis. 3. New heterogeneous 2.3 cm subcapsular segment VI hepatic lesion  and 14 mm segment VI/VII hepatic lesion, compatible with hepatic metastatic disease. 4. No evidence of metastatic disease in the chest. 5. Nonobstructive left renal stones measure up to 6 mm. 6. Mild symmetric wall thickening of an incompletely distended urinary bladder. Correlate with urinalysis to exclude cystitis. 7.  Aortic Atherosclerosis (ICD10-I70.0). Electronically Signed   By: Maudry Mayhew M.D.   On: 07/10/2023 11:39      07/20/2023 -  Chemotherapy   Patient is on Treatment Plan : Ovarian Docetaxel (100) q21d       PHYSICAL EXAMINATION: ECOG PERFORMANCE STATUS: 1 - Symptomatic but completely ambulatory  Vitals:   07/11/23 1443  BP: (!) 152/81  Pulse: 94  Resp: 18  Temp: 97.9 F (36.6 C)  SpO2: 99%   Filed Weights   07/11/23 1443  Weight: 144 lb 12.8 oz (65.7 kg)    GENERAL:alert, no distress and comfortable LABORATORY DATA:  I have reviewed the data as listed    Component Value Date/Time   NA 139 06/22/2023 0801   K 3.7 06/22/2023 0801   CL 105 06/22/2023 0801   CO2 29 06/22/2023 0801   GLUCOSE 98 06/22/2023 0801   BUN 12 06/22/2023 0801   CREATININE 0.55 06/22/2023 0801   CALCIUM 9.4 06/22/2023 0801   PROT 6.2 (L) 06/22/2023 0801   ALBUMIN 3.9 06/22/2023 0801   AST 26 06/22/2023 0801   ALT 21 06/22/2023 0801   ALKPHOS 93 06/22/2023 0801   BILITOT 1.2 06/22/2023 0801   GFRNONAA >60 06/22/2023 0801   GFRAA >60 02/24/2020 0852    No results found for: "SPEP", "UPEP"  Lab Results  Component Value Date   WBC 4.7 06/22/2023   NEUTROABS 2.8 06/22/2023   HGB 12.0 06/22/2023   HCT 35.7 (L) 06/22/2023   MCV 94.9 06/22/2023   PLT 231 06/22/2023      Chemistry      Component Value Date/Time   NA 139 06/22/2023 0801   K 3.7 06/22/2023 0801   CL 105 06/22/2023 0801   CO2 29 06/22/2023 0801   BUN 12 06/22/2023 0801   CREATININE 0.55 06/22/2023 0801      Component Value Date/Time   CALCIUM 9.4 06/22/2023 0801   ALKPHOS 93 06/22/2023 0801   AST 26  06/22/2023 0801   ALT 21 06/22/2023 0801   BILITOT 1.2 06/22/2023 0801       RADIOGRAPHIC STUDIES: I have reviewed multiple imaging studies with the patient and her sister I have personally reviewed the radiological images as listed and agreed with the findings in the report. CT CHEST ABDOMEN  PELVIS W CONTRAST Result Date: 07/10/2023 CLINICAL DATA:  History of recurrent ovarian cancer, follow-up. * Tracking Code: BO * EXAM: CT CHEST, ABDOMEN, AND PELVIS WITH CONTRAST TECHNIQUE: Multidetector CT imaging of the chest, abdomen and pelvis was performed following the standard protocol during bolus administration of intravenous contrast. RADIATION DOSE REDUCTION: This exam was performed according to the departmental dose-optimization program which includes automated exposure control, adjustment of the mA and/or kV according to patient size and/or use of iterative reconstruction technique. CONTRAST:  OMNIPAQUE IOHEXOL 300 MG/ML  SOLN COMPARISON:  Multiple priors including CT April 10, 2023 FINDINGS: CT CHEST FINDINGS Cardiovascular: Right chest Port-A-Cath with tip near the superior cavoatrial junction. Normal caliber thoracic aorta. Normal size heart. Mediastinum/Nodes: No suspicious thyroid nodule. No pathologically enlarged mediastinal, hilar or axillary lymph nodes. Esophagus is grossly unremarkable. Lungs/Pleura: Biapical pleuroparenchymal scarring. 4 mm left lower lobe pulmonary nodule on image 62/6 stable from coronary CT Oct 06, 2021 suggestive of a benign finding. No new suspicious pulmonary nodules or masses. Musculoskeletal: No aggressive lytic or blastic lesion of bone. CT ABDOMEN PELVIS FINDINGS Hepatobiliary: New heterogeneous 2.3 cm subcapsular segment VI hepatic lesion on image 57/2 and 14 mm segment VI/VII hepatic lesion on image 55/2. Gallbladder is unremarkable.  No biliary ductal dilation. Pancreas: No pancreatic ductal dilation or evidence of acute inflammation. Spleen: No  splenomegaly. Adrenals/Urinary Tract: No suspicious adrenal nodule/mass. Hydronephrosis. Nonobstructive left renal stones measure up to 6 mm. Kidneys demonstrate symmetric enhancement. Mild symmetric wall thickening of an incompletely distended urinary bladder. Stomach/Bowel: Stomach is unremarkable for degree of distension. No pathologic dilation of small or large bowel. No evidence of acute bowel inflammation. Vascular/Lymphatic: Normal caliber abdominal aorta. Aortic atherosclerosis. Smooth IVC contours. The portal, splenic and superior mesenteric veins are patent. Increased size of the low-density left external iliac lymph node now measuring 14 mm in short axis on image 93/2 previously 7 mm. Reproductive: Increased size of the heterogeneous lesion in the arising from the right vaginal cuff now measuring 4.9 x 3.4 cm on image 105/2 previously 2.5 x 1.9 cm. Left ovarian cystic lesion with persistent enhancing soft tissue along the margin of the lesion for instance on image 101/2 now in total measures 5.3 x 5.1 cm on image 108/2 previously 4.2 x 4.2 cm. Other: Increased abdominopelvic free fluid with a new loculated collection in the pelvis. Few new scattered soft tissue nodules in the omentum/peritoneum for instance along the right pericolic gutter measuring 7 mm on image 93/2 and in the left upper quadrant measuring 5 mm on image 53/2. Musculoskeletal: No aggressive lytic or blastic lesion of bone. IMPRESSION: 1. Increased size of the heterogeneous lesion arising from the right vaginal cuff, increased size of the left ovarian cystic lesion with persistent enhancing soft tissue along the margin of the lesion, and increased size of the low-density left external iliac lymph node, compatible with worsening disease. 2. Increased abdominopelvic free fluid with a new loculated collection in the pelvis and a few new scattered soft tissue nodules in the omentum/peritoneum, compatible with peritoneal carcinomatosis. 3. New  heterogeneous 2.3 cm subcapsular segment VI hepatic lesion and 14 mm segment VI/VII hepatic lesion, compatible with hepatic metastatic disease. 4. No evidence of metastatic disease in the chest. 5. Nonobstructive left renal stones measure up to 6 mm. 6. Mild symmetric wall thickening of an incompletely distended urinary bladder. Correlate with urinalysis to exclude cystitis. 7.  Aortic Atherosclerosis (ICD10-I70.0). Electronically Signed   By: Maudry Mayhew M.D.   On:  07/10/2023 11:39   

## 2023-07-11 NOTE — Telephone Encounter (Signed)
Called and scheduled appt at 2:30 pm today. She is aware of appt.

## 2023-07-12 ENCOUNTER — Other Ambulatory Visit: Payer: Self-pay

## 2023-07-13 ENCOUNTER — Other Ambulatory Visit (HOSPITAL_COMMUNITY): Payer: Medicare Other

## 2023-07-13 ENCOUNTER — Ambulatory Visit (HOSPITAL_COMMUNITY): Payer: Medicare Other

## 2023-07-13 ENCOUNTER — Inpatient Hospital Stay: Payer: Medicare Other | Attending: Gynecologic Oncology | Admitting: Hematology and Oncology

## 2023-07-13 ENCOUNTER — Telehealth: Payer: Self-pay

## 2023-07-13 NOTE — Telephone Encounter (Signed)
Called and given below message. She will continue laxatives and call back in the am to give update.

## 2023-07-13 NOTE — Telephone Encounter (Signed)
I recommend continue laxatives for another day, call back tomorrow

## 2023-07-13 NOTE — Telephone Encounter (Signed)
-----   Message from Artis Delay sent at 07/13/2023  9:00 AM EST ----- Can you call her and ask if she has BM? I recommend a lot of laxatives

## 2023-07-13 NOTE — Telephone Encounter (Signed)
Called to see how she is doing today. She is having multiple diarrhea stools that started at 1 am today. She is having abdominal rumbling and some cramping at times. She has not needed the oxycodone for the pain but will take if needed prn. She is having a little nausea at times. She vomited during the night and then felt better afterwards. She will take zofran/ compazine if needed. She is trying to eat and drinking with no problems. She will continue laxatives.  She is asking if Dr. [name] recommends anything else?

## 2023-07-14 ENCOUNTER — Other Ambulatory Visit: Payer: Self-pay | Admitting: Hematology and Oncology

## 2023-07-14 ENCOUNTER — Telehealth: Payer: Self-pay

## 2023-07-14 MED ORDER — LACTULOSE 10 GM/15ML PO SOLN: 10.0000 g | Freq: Two times a day (BID) | ORAL | 0 refills | Status: DC

## 2023-07-14 MED ORDER — MAGNESIUM OXIDE -MG SUPPLEMENT 400 (240 MG) MG PO TABS: 400.0000 mg | ORAL_TABLET | Freq: Every day | ORAL | 1 refills | Status: AC

## 2023-07-14 NOTE — Telephone Encounter (Signed)
Called and given below message. She will start magnesium and lactulose. She does not take any supplements at this time. Told her the office will call her Monday to get update. Instructed to call the office back or go to ER for worsening symptoms. She verbalized understanding.

## 2023-07-14 NOTE — Telephone Encounter (Signed)
Tell her to DC all calcium supplement I sent prescription for daily mag and lactulose BID Please call her on Monday to check on her

## 2023-07-14 NOTE — Telephone Encounter (Signed)
Returned her call. She passed a little liquid stool this am and a couple times yesterday she passed very small amount of liquid stool. She did not eat anything yesterday but did drink a fairlife protein drink.  She is taking Miralax and senokot as instructed. She missed the senokot at bedtime last night. She is complaining of a lot gas pains and cramping when she takes the senokot. She took oxycodone and tylenol for pain. She is feeling full and when she takes the laxatives she is complaining of nausea. Reminded her to take compazine and zofran as needed.  She is asking what Dr. [name] recommends? She uses [facility] if a Rx is sent.

## 2023-07-16 ENCOUNTER — Inpatient Hospital Stay (HOSPITAL_COMMUNITY)
Admission: EM | Admit: 2023-07-16 | Discharge: 2023-07-24 | DRG: 982 | Disposition: A | Discharge status: Home Health | Payer: Medicare Other | Attending: Family Medicine | Admitting: Family Medicine

## 2023-07-16 ENCOUNTER — Encounter (HOSPITAL_COMMUNITY): Payer: Self-pay | Admitting: Emergency Medicine

## 2023-07-16 ENCOUNTER — Other Ambulatory Visit: Payer: Self-pay

## 2023-07-16 ENCOUNTER — Emergency Department (HOSPITAL_COMMUNITY): Payer: Medicare Other

## 2023-07-16 ENCOUNTER — Inpatient Hospital Stay (HOSPITAL_COMMUNITY): Payer: Medicare Other

## 2023-07-16 DIAGNOSIS — C7960 Secondary malignant neoplasm of unspecified ovary: Secondary | ICD-10-CM | POA: Diagnosis not present

## 2023-07-16 DIAGNOSIS — Z91013 Allergy to seafood: Secondary | ICD-10-CM | POA: Diagnosis not present

## 2023-07-16 DIAGNOSIS — K219 Gastro-esophageal reflux disease without esophagitis: Secondary | ICD-10-CM | POA: Diagnosis present

## 2023-07-16 DIAGNOSIS — K56609 Unspecified intestinal obstruction, unspecified as to partial versus complete obstruction: Principal | ICD-10-CM | POA: Diagnosis present

## 2023-07-16 DIAGNOSIS — D638 Anemia in other chronic diseases classified elsewhere: Secondary | ICD-10-CM | POA: Diagnosis present

## 2023-07-16 DIAGNOSIS — C786 Secondary malignant neoplasm of retroperitoneum and peritoneum: Secondary | ICD-10-CM | POA: Diagnosis present

## 2023-07-16 DIAGNOSIS — Z803 Family history of malignant neoplasm of breast: Secondary | ICD-10-CM

## 2023-07-16 DIAGNOSIS — Z800 Family history of malignant neoplasm of digestive organs: Secondary | ICD-10-CM

## 2023-07-16 DIAGNOSIS — Z87891 Personal history of nicotine dependence: Secondary | ICD-10-CM | POA: Diagnosis not present

## 2023-07-16 DIAGNOSIS — K589 Irritable bowel syndrome without diarrhea: Secondary | ICD-10-CM | POA: Diagnosis present

## 2023-07-16 DIAGNOSIS — E876 Hypokalemia: Secondary | ICD-10-CM | POA: Diagnosis present

## 2023-07-16 DIAGNOSIS — Z933 Colostomy status: Secondary | ICD-10-CM

## 2023-07-16 DIAGNOSIS — E872 Acidosis, unspecified: Secondary | ICD-10-CM | POA: Diagnosis present

## 2023-07-16 DIAGNOSIS — Z8049 Family history of malignant neoplasm of other genital organs: Secondary | ICD-10-CM | POA: Diagnosis not present

## 2023-07-16 DIAGNOSIS — E785 Hyperlipidemia, unspecified: Secondary | ICD-10-CM | POA: Diagnosis present

## 2023-07-16 DIAGNOSIS — C562 Malignant neoplasm of left ovary: Secondary | ICD-10-CM | POA: Diagnosis not present

## 2023-07-16 DIAGNOSIS — Z8542 Personal history of malignant neoplasm of other parts of uterus: Secondary | ICD-10-CM | POA: Diagnosis not present

## 2023-07-16 DIAGNOSIS — Z833 Family history of diabetes mellitus: Secondary | ICD-10-CM | POA: Diagnosis not present

## 2023-07-16 DIAGNOSIS — E440 Moderate protein-calorie malnutrition: Secondary | ICD-10-CM | POA: Diagnosis present

## 2023-07-16 DIAGNOSIS — Z6826 Body mass index (BMI) 26.0-26.9, adult: Secondary | ICD-10-CM

## 2023-07-16 DIAGNOSIS — I700 Atherosclerosis of aorta: Secondary | ICD-10-CM | POA: Diagnosis present

## 2023-07-16 DIAGNOSIS — N132 Hydronephrosis with renal and ureteral calculous obstruction: Secondary | ICD-10-CM | POA: Diagnosis present

## 2023-07-16 DIAGNOSIS — Z79899 Other long term (current) drug therapy: Secondary | ICD-10-CM

## 2023-07-16 DIAGNOSIS — K5909 Other constipation: Secondary | ICD-10-CM | POA: Diagnosis present

## 2023-07-16 DIAGNOSIS — Z807 Family history of other malignant neoplasms of lymphoid, hematopoietic and related tissues: Secondary | ICD-10-CM

## 2023-07-16 DIAGNOSIS — Z90710 Acquired absence of both cervix and uterus: Secondary | ICD-10-CM

## 2023-07-16 DIAGNOSIS — Z9221 Personal history of antineoplastic chemotherapy: Secondary | ICD-10-CM

## 2023-07-16 DIAGNOSIS — F411 Generalized anxiety disorder: Secondary | ICD-10-CM | POA: Diagnosis present

## 2023-07-16 DIAGNOSIS — Z8249 Family history of ischemic heart disease and other diseases of the circulatory system: Secondary | ICD-10-CM

## 2023-07-16 DIAGNOSIS — Z881 Allergy status to other antibiotic agents status: Secondary | ICD-10-CM

## 2023-07-16 DIAGNOSIS — Z433 Encounter for attention to colostomy: Secondary | ICD-10-CM

## 2023-07-16 DIAGNOSIS — Z888 Allergy status to other drugs, medicaments and biological substances status: Secondary | ICD-10-CM

## 2023-07-16 DIAGNOSIS — Z1732 Human epidermal growth factor receptor 2 negative status: Secondary | ICD-10-CM

## 2023-07-16 DIAGNOSIS — Z83438 Family history of other disorder of lipoprotein metabolism and other lipidemia: Secondary | ICD-10-CM | POA: Diagnosis not present

## 2023-07-16 DIAGNOSIS — Z170 Estrogen receptor positive status [ER+]: Secondary | ICD-10-CM

## 2023-07-16 DIAGNOSIS — Z515 Encounter for palliative care: Secondary | ICD-10-CM | POA: Diagnosis not present

## 2023-07-16 DIAGNOSIS — Z882 Allergy status to sulfonamides status: Secondary | ICD-10-CM

## 2023-07-16 DIAGNOSIS — C7963 Secondary malignant neoplasm of bilateral ovaries: Secondary | ICD-10-CM

## 2023-07-16 LAB — CBC WITH DIFFERENTIAL/PLATELET
Abs Immature Granulocytes: 0.02 10*3/uL (ref 0.00–0.07)
Basophils Absolute: 0 10*3/uL (ref 0.0–0.1)
Basophils Relative: 0 %
Eosinophils Absolute: 0 10*3/uL (ref 0.0–0.5)
Eosinophils Relative: 0 %
HCT: 37.2 % (ref 36.0–46.0)
Hemoglobin: 12.1 g/dL (ref 12.0–15.0)
Immature Granulocytes: 0 %
Lymphocytes Relative: 15 %
Lymphs Abs: 0.8 10*3/uL (ref 0.7–4.0)
MCH: 31.4 pg (ref 26.0–34.0)
MCHC: 32.5 g/dL (ref 30.0–36.0)
MCV: 96.6 fL (ref 80.0–100.0)
Monocytes Absolute: 0.9 10*3/uL (ref 0.1–1.0)
Monocytes Relative: 18 %
Neutro Abs: 3.5 10*3/uL (ref 1.7–7.7)
Neutrophils Relative %: 67 %
Platelets: 300 10*3/uL (ref 150–400)
RBC: 3.85 MIL/uL — ABNORMAL LOW (ref 3.87–5.11)
RDW: 13.2 % (ref 11.5–15.5)
WBC: 5.3 10*3/uL (ref 4.0–10.5)
nRBC: 0 % (ref 0.0–0.2)

## 2023-07-16 LAB — COMPREHENSIVE METABOLIC PANEL
ALT: 22 U/L (ref 0–44)
AST: 27 U/L (ref 15–41)
Albumin: 3.3 g/dL — ABNORMAL LOW (ref 3.5–5.0)
Alkaline Phosphatase: 156 U/L — ABNORMAL HIGH (ref 38–126)
Anion gap: 17 — ABNORMAL HIGH (ref 5–15)
BUN: 16 mg/dL (ref 8–23)
CO2: 23 mmol/L (ref 22–32)
Calcium: 9.3 mg/dL (ref 8.9–10.3)
Chloride: 98 mmol/L (ref 98–111)
Creatinine, Ser: 0.69 mg/dL (ref 0.44–1.00)
GFR, Estimated: >60 mL/min (ref >60)
Glucose, Bld: 109 mg/dL — ABNORMAL HIGH (ref 70–99)
Potassium: 3.1 mmol/L — ABNORMAL LOW (ref 3.5–5.1)
Sodium: 138 mmol/L (ref 135–145)
Total Bilirubin: 1.3 mg/dL — ABNORMAL HIGH (ref 0.0–1.2)
Total Protein: 6.7 g/dL (ref 6.5–8.1)

## 2023-07-16 LAB — PHOSPHORUS: Phosphorus: 4.1 mg/dL (ref 2.5–4.6)

## 2023-07-16 LAB — MAGNESIUM: Magnesium: 1.9 mg/dL (ref 1.7–2.4)

## 2023-07-16 MED ORDER — SODIUM CHLORIDE 0.9% FLUSH
3.0000 mL | Freq: Two times a day (BID) | INTRAVENOUS | Status: DC
  Administered 2023-07-16 – 2023-07-24 (×15): 3 mL via INTRAVENOUS

## 2023-07-16 MED ORDER — ONDANSETRON HCL 4 MG/2ML IJ SOLN
4.0000 mg | Freq: Four times a day (QID) | INTRAMUSCULAR | Status: DC
  Administered 2023-07-16 – 2023-07-21 (×15): 4 mg via INTRAVENOUS
  Filled 2023-07-16 (×18): qty 2

## 2023-07-16 MED ORDER — LORAZEPAM 2 MG/ML IJ SOLN
0.5000 mg | Freq: Four times a day (QID) | INTRAMUSCULAR | Status: DC | PRN
  Administered 2023-07-17 – 2023-07-21 (×6): 0.5 mg via INTRAVENOUS
  Filled 2023-07-16 (×6): qty 1

## 2023-07-16 MED ORDER — PHENOL 1.4 % MT LIQD
1.0000 | OROMUCOSAL | Status: DC | PRN
  Filled 2023-07-16: qty 177

## 2023-07-16 MED ORDER — ACETAMINOPHEN 650 MG RE SUPP: 650.0000 mg | Freq: Four times a day (QID) | RECTAL | Status: DC | PRN

## 2023-07-16 MED ORDER — HYDROMORPHONE HCL 1 MG/ML IJ SOLN
1.0000 mg | INTRAMUSCULAR | Status: DC | PRN
  Administered 2023-07-16 – 2023-07-19 (×23): 1 mg via INTRAVENOUS
  Filled 2023-07-16 (×24): qty 1

## 2023-07-16 MED ORDER — SODIUM CHLORIDE 0.9 % IV BOLUS
1000.0000 mL | Freq: Once | INTRAVENOUS | Status: AC
  Administered 2023-07-16: 1000 mL via INTRAVENOUS

## 2023-07-16 MED ORDER — ACETAMINOPHEN 325 MG PO TABS: 650.0000 mg | ORAL_TABLET | Freq: Four times a day (QID) | ORAL | Status: DC | PRN

## 2023-07-16 MED ORDER — KCL IN DEXTROSE-NACL 20-5-0.9 MEQ/L-%-% IV SOLN
INTRAVENOUS | Status: DC
  Filled 2023-07-16 (×5): qty 1000

## 2023-07-16 MED ORDER — MORPHINE SULFATE (PF) 4 MG/ML IV SOLN
4.0000 mg | Freq: Once | INTRAVENOUS | Status: AC
  Administered 2023-07-16: 4 mg via INTRAVENOUS
  Filled 2023-07-16: qty 1

## 2023-07-16 MED ORDER — HYDROMORPHONE HCL 1 MG/ML IJ SOLN: 1.0000 mg | INTRAMUSCULAR | Status: DC | PRN

## 2023-07-16 MED ORDER — ONDANSETRON HCL 4 MG PO TABS
4.0000 mg | ORAL_TABLET | Freq: Four times a day (QID) | ORAL | Status: DC
  Administered 2023-07-20 – 2023-07-24 (×15): 4 mg via ORAL
  Filled 2023-07-16 (×16): qty 1

## 2023-07-16 MED ORDER — ONDANSETRON HCL 4 MG/2ML IJ SOLN
4.0000 mg | Freq: Four times a day (QID) | INTRAMUSCULAR | Status: DC | PRN
Start: 2023-07-16 — End: 2023-07-16

## 2023-07-16 MED ORDER — ENOXAPARIN SODIUM 40 MG/0.4ML IJ SOSY
40.0000 mg | PREFILLED_SYRINGE | INTRAMUSCULAR | Status: DC
  Administered 2023-07-16 – 2023-07-18 (×3): 40 mg via SUBCUTANEOUS
  Filled 2023-07-16 (×3): qty 0.4

## 2023-07-16 MED ORDER — PANTOPRAZOLE SODIUM 40 MG IV SOLR
40.0000 mg | INTRAVENOUS | Status: DC
  Administered 2023-07-16 – 2023-07-21 (×5): 40 mg via INTRAVENOUS
  Filled 2023-07-16 (×6): qty 10

## 2023-07-16 MED ORDER — ONDANSETRON HCL 4 MG PO TABS
4.0000 mg | ORAL_TABLET | Freq: Four times a day (QID) | ORAL | Status: DC | PRN
Start: 2023-07-16 — End: 2023-07-16

## 2023-07-16 NOTE — Consult Note (Signed)
  [facility] consult note  Patient Care Team: Geoffry Paradise, MD as PCP - General (Internal Medicine) Leanora Ivanoff, RN as Registered Nurse   ASSESSMENT & PLAN:   68 old woman with history of metastatic ovarian cancer with peritoneal carcinomatosis on palliative chemotherapy with history of open TAH/BSO, omentectomy and radical tumor debulking by Dr. Andrey Farmer in 2020 who presents with abdominal pain, distention, and lack of bowel movement for the last 6 days despite aggressive bowel regimen.  CT today in the emergency room showed complete obstruction and colonic dilation at the level of distal sigmoid colon.  Patient has been evaluated by surgery and recommend NG tube decompression, bowel rest, IV fluid and diverting colostomy. She has progressed on niraparib, paclitaxel, Doxil and bevacizumab.  We had discussion of her CT findings today.  She has a large mass on CT scan.this is causing obstruction.  I have communicated with the surgeon.  Surgery will provide relief but will take likely few weeks to recover.  Discussed with the patient, treatment with next line of chemotherapy is palliative.  She understands from long time ago she knew this is incurable.  She feels that she would like to continue being treated as much as she is able to.  In this case, it is reasonable to proceed with palliative intent of surgery to provide palliation of her obstruction.  She understands this may take a few weeks to recover and in the meantime she will not be getting systemic chemotherapy until recovers well.  We discussed that if she recovers well, performance status allows, she may follow-up with Dr. [name] to discuss docetaxel chemotherapy.  If she does not recover well, or continue to decline, then chemotherapy will not be offered.  Proceed with surgery per  Dr. Fredricka Bonine. Control nausea and vomiting as needed Recommend IV fluid  Patient will follow-up with Dr. [name] after discharge.  I will  let her know when she is back next Wednesday.  Goals of care Palliation from cancer as able  All questions were answered. The patient knows to call the clinic with any problems, questions or concerns.   Melven Sartorius, MD 07/16/2023 1:26 PM   CHIEF COMPLAINTS/PURPOSE OF ADMISSION Ovarian cancer  HISTORY OF PRESENTING ILLNESS:  Stacy Rivas 68 y.o. female is admitted for bowel obstruction.  Patient has extensive history of ovarian cancer with metastasis, underwent several palliative systemic treatments already.  Recently presented with progression and planning on switching treatment.  She reports unable to have bowel movement despite aggressive bowel regimens at home.  This has been persistent over the past 6 days or so.  She has no gas as well.  She has worsening abdominal pain and this resulted in her presentation to the emergency room.  She also report as a result of this, she feels weaker and unable to work.  She was normally working as of recently.  She reports some nausea, not much vomiting.  No trouble urinating but she is not eating much.  The rest of the review of system was negative for chest pain, shortness of breath, coughing, fever, chills, swelling, palpable mass.  She denies any other symptoms.  In the emergency room CT of abdomen and pelvis was obtained and showed the following: CT AP: Interval development of severe diffuse colonic dilatation with complete obstruction at the level of the distal sigmoid colon, most likely due to external compression from adjacent malignancy or peritoneal implant given history of ovarian carcinoma. There is noted  11.5 x 9.1 cm complex cystic abnormality in the pelvis with large peripheral solid components which is concerning for cystic ovarian malignancy or metastatic disease, or possibly loculated ascites with adjacent peritoneal carcinomatosis.   Nonobstructive left nephrolithiasis is noted. Mild bilateral hydroureteronephrosis is noted  without obstructing calculus, concerning for distal ureteral obstruction secondary to neoplasm.  Summary of oncologic history as follows: Oncology History Overview Note  Her 2 neu 1+ MSI stable, serous, ER 40% Negative genetics Progressed on niraparib, paclitaxel, Doxil and bevacizumab Allergic to carboplatin Folate receptor alpha neg PD-L1 CPS 1%   Left ovarian epithelial cancer (HCC)  11/09/2018 Imaging   1. 14.7 cm poorly defined soft tissue mass in central pelvis suspicious for primary ovarian carcinoma, with uterine leiomyosarcoma considered a less likely differential diagnosis. 2. Moderate ascites and diffuse peritoneal carcinomatosis. 3. Several small uterine fibroids.   11/13/2018 Tumor Marker   Patient's tumor was tested for the following markers: CA-125 Results of the tumor marker test revealed 1434   11/21/2018 Procedure   Successful ultrasound-guided diagnostic and therapeutic paracentesis yielding 3.1 liters of peritoneal fluid   11/21/2018 Pathology Results   PERITONEAL/ASCITIC FLUID(SPECIMEN 1 OF 1 COLLECTED 11/21/18): ADENOCARCINOMA. Specimen Clinical Information Pelvic mass suspicious for ovarian cancer Source Peritoneal/Ascitic Fluid, (specimen 1 of 1 collected 11/21/18) Gross Specimen: Received is/are 1000 ccs of dark amber fluid. (CM:cm) Prepared: # Smears: 0 # Concentration Technique Slides (i.e. 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   11/29/2018 Initial Diagnosis   Ovarian cancer (HCC)   11/29/2018 Cancer Staging   Staging form: Ovary, Fallopian Tube, and Primary Peritoneal Carcinoma, AJCC 8th Edition - Clinical: cT3, cN0, cM0 - Signed by Artis Delay, MD on 11/29/2018   12/03/2018 Procedure   Successful ultrasound-guided therapeutic paracentesis yielding 3.7 liters of peritoneal fluid.     12/06/2018 Procedure   Placement of a subcutaneous port device. Catheter  tip at the SVC and right atrium junction   12/14/2018 Procedure   Successful ultrasound-guided paracentesis yielding 2.9 L of peritoneal fluid   12/28/2018 Tumor Marker   Patient's tumor was tested for the following markers: CA-125 Results of the tumor marker test revealed 812.   12/28/2018 - 04/22/2019 Chemotherapy   The patient had carboplatin and taxol for neoadjuvant treatment, followed by interval debulking surgery and subsequent adjuvant chemotherapy treatment.     02/01/2019 Imaging   Ct abdomen and pelvis 8.6 cm left ovarian mass, corresponding to the patient's known primary neoplasm, improved.   Mild peritoneal nodularity/omental caking, improved.   Small abdominopelvic ascites, improved.     02/01/2019 Tumor Marker   Patient's tumor was tested for the following markers: CA-125 Results of the tumor marker test revealed 183.   02/12/2019 Surgery   Preoperative Diagnosis: Stage IIIC ovarian cancer, s/p neoadjuvant chemotherapy      Procedure(s) Performed: 1. Exploratory laparotomy with total abdominal hysterectomy, bilateral salpingo-oophorectomy, omentectomy radical tumor debulking for ovarian cancer.   Surgeon: Luisa Dago, MD.    Operative Findings: upper abdomen free of disease. No visible omental disease. Small volume (200cc) ascites. 8cm friable mass replacing left ovary and adherent to the sigmoid colon mesentery and ureter on the left.  Anterior fibroid. This represented an optimal cytoreduction (R0) with no gross visible disease remaining.    02/12/2019 Pathology Results   A. OVARY AND FALLOPIAN TUBE, LEFT, SALPINGO-OOPHORECTOMY: - Serous carcinoma, high grade, status post neoadjuvant therapy - See oncology table and comment below  B. UTERUS CERVIX WITH RIGHT FALLOPIAN TUBE AND OVARY, HYSTERECTOMY: Uterus: - Serosal surface involved by serous carcinoma - Endomyometrium uninvolved by carcinoma - Benign endometrial polyp (4.1 cm) - Leiomyomata (5.5 cm; largest) -  Adenomyosis Cervix: - Uninvolved by carcinoma Left ovary: - Serous carcinoma, high grade Left fallopian tube: - Serous carcinoma, high grade C. SOFT TISSUE, LEFT PELVIC SIDEWALL TUMOR, EXCISION: - Metastatic serous carcinoma, high-grade D. SOFT TISSUE, SIGMOID COLON MESENTERY, EXCISION: - Metastatic serous carcinoma, high-grade E. OMENTUM, TUMOR RESECTION: - Metastatic serous carcinoma, high-grade OVARY or FALLOPIAN TUBE or PRIMARY PERITONEUM: Procedure: Total hysterectomy and bilateral salpingo-oophorectomy, omentectomy and peritoneal biopsies Specimen Integrity: Fragmented Tumor Site: Left ovary and fallopian tube Ovarian Surface Involvement: Present Fallopian Tube Surface Involvement: Present Tumor Size: 6.3 cm (see comment) Histologic Type: Serous carcinoma Histologic Grade: High-grade Implants: Not applicable Other Tissue/ Organ Involvement: Right ovary, right fallopian tube, omentum, mesentery Largest Extrapelvic Peritoneal Focus: Macroscopic Peritoneal/Ascitic Fluid: Malignant Treatment Effect: No definite or minimal response identified (chemotherapy response score 1 [CRS1] Regional Lymph Nodes: No lymph nodes submitted or found Pathologic Stage Classification (pTNM, AJCC 8th Edition): pT3c, pNX Representative Tumor Block: A4 Comment(s): Additional testing (HER-2, MMR and MSI) are pending. The primary tumor site appears to be the left ovary and fallopian tube. The uterus is only involved on the serosal surface. There is tumor on the anterior peritoneal reflection.  Addendum: Tumor is Her2 negative,MSI stable   02/19/2019 Tumor Marker   Patient's tumor was tested for the following markers: CA-125 Results of the tumor marker test revealed 40.3   04/01/2019 Tumor Marker   Patient's tumor was tested for the following markers: CA-125 Results of the tumor marker test revealed 8.1    Genetic Testing   Negative testing. No pathogenic variants identified on the [employer]. The report date is 04/17/2019.  Somatic genes analyzed through [facility]: ATM, BARD1, BRCA1, BRCA2, BRIP1, CHEK2, MRE11A, NBN, PALB2, RAD51C, RAD51D.  The [facility] gene panel offered by [employer] includes sequencing and rearrangement analysis for the following 36 genes: APC*, ATM*, AXIN2, BARD1, BMPR1A, BRCA1*, BRCA2*, BRIP1*, CDH1*, CDK4, CDKN2A, CHEK2*, DICER1, MLH1*, MSH2*, MSH3, MSH6*, MUTYH*, NBN, NF1*, NTHL1, PALB2*, PMS2*, PTEN*, RAD51C*, RAD51D*, RECQL, SMAD4, SMARCA4, STK11 and TP53* (sequencing and deletion/duplication); HOXB13, POLD1 and POLE (sequencing only); EPCAM and GREM1 (deletion/duplication only).    05/21/2019 Imaging   1. Interval hysterectomy, oophorectomy and omentectomy. No evidence of residual ovarian carcinoma in the pelvis. No evidence of peritoneal disease. No intraperitoneal free fluid. 2. Nonobstructing LEFT renal calculi.  Normal ureters.   05/22/2019 Tumor Marker   Patient's tumor was tested for the following markers: CA-125 Results of the tumor marker test revealed 5.7.   06/03/2019 Procedure   Successful right IJ vein Port-A-Cath explant.   08/26/2019 Tumor Marker   Patient's tumor was tested for the following markers: CA-125. Results of the tumor marker test revealed 4.9   10/03/2019 Imaging   1. New tumor deposits along the capsular surfaces of the liver, spleen, in the left pelvis, and right paracolic gutter, compatible with metastatic disease. 2. Mild dilation of the dorsal pancreatic duct in the pancreatic body and head, cause uncertain. 3. Nonobstructive left nephrolithiasis. 4. Aortic atherosclerosis.   Aortic Atherosclerosis (ICD10-I70.0   10/10/2019 Procedure   Successful placement of a right IJ approach Power Port with ultrasound and fluoroscopic guidance. The catheter is ready for use.     10/11/2019 Tumor Marker   Patient's tumor was tested for the following markers: CA-125 Results of  the tumor  marker test revealed 19.6.   10/14/2019 -  Chemotherapy   The patient had carboplatin and gemzar for chemotherapy treatment.     11/04/2019 Tumor Marker   Patient's tumor was tested for the following markers: CA-125 Results of the tumor marker test revealed 11.4   11/28/2019 Tumor Marker   Patient's tumor was tested for the following markers: CA-125 Results of the tumor marker test revealed 5.8   12/20/2019 Imaging   1. Significant interval reduction in mixed solid and cystic nodule in the vicinity of the left ovary, with significant improvement or resolution of multiple pelvic, peritoneal, and organ capsule implants. Resolution of previously seen small volume ascites. Findings are consistent with treatment response of abdominal metastatic disease. 2. Status post hysterectomy, oophorectomy, and omentectomy. 3. Nonobstructive left nephrolithiasis. 4. Aortic Atherosclerosis (ICD10-I70.0).   12/23/2019 Tumor Marker   Patient's tumor was tested for the following markers: CA-125 Results of the tumor marker test revealed 5.0   01/24/2020 Tumor Marker   Patient's tumor was tested for the following markers: CA-125 Results of the tumor marker test revealed 4.8   02/24/2020 Tumor Marker   Patient's tumor was tested for the following markers: CA-125. Results of the tumor marker test revealed 4.3.   03/06/2020 Imaging   1. Status post hysterectomy, bilateral oophorectomy, and omentectomy. 2. Resolution of previously described left adnexal nodularity. 3. No residual disease identified. 4.  Aortic Atherosclerosis (ICD10-I70.0). 5. Left nephrolithiasis.   04/20/2020 Tumor Marker   Patient's tumor was tested for the following markers: CA-125 Results of the tumor marker test revealed 4.6   05/18/2020 Tumor Marker   Patient's tumor was tested for the following markers: CA-125 Results of the tumor marker test revealed 4.7   05/29/2020 Imaging   Status post hysterectomy and bilateral salpingo  oophorectomy.   No evidence of recurrent or metastatic disease.   06/08/2020 - 09/13/2021 Chemotherapy   She was on niraparib       06/17/2020 Tumor Marker   Patient's tumor was tested for the following markers: CA-125 Results of the tumor marker test revealed 4.5   08/04/2020 Tumor Marker   Patient's tumor was tested for the following markers: CA-125. Results of the tumor marker test revealed 5.7   09/07/2020 Imaging   1. Status post hysterectomy and bilateral salpingo oophorectomy. No evidence of recurrent or metastatic disease. 2. Nonobstructing left nephrolithiasis. 3. Left-sided colonic diverticulosis without findings of acute diverticulitis. 4. Aortic atherosclerosis.     09/07/2020 Tumor Marker   Patient's tumor was tested for the following markers: CA-125 Results of the tumor marker test revealed 4.8.   10/21/2020 Tumor Marker   Patient's tumor was tested for the following markers: CA-125. Results of the tumor marker test revealed 5.1.   12/14/2020 Tumor Marker   Patient's tumor was tested for the following markers: CA-125. Results of the tumor marker test revealed 5.5.   04/05/2021 Tumor Marker   Patient's tumor was tested for the following markers: CA-125. Results of the tumor marker test revealed 5.9.   05/31/2021 Tumor Marker   Patient's tumor was tested for the following markers: CA-125. Results of the tumor marker test revealed 8.1.   09/13/2021 Imaging   1. Interval development of multiple new cystic and solid lesions in the abdomen and pelvis measuring up to 10.3 x 7.2 cm and consistent with peritoneal metastatic disease. 2. Nonobstructing left renal stones. 3. Aortic Atherosclerosis (ICD10-I70.0).     09/14/2021 Tumor Marker   Patient's tumor was tested for  the following markers: CA-125. Results of the tumor marker test revealed 28.5.   09/16/2021 Echocardiogram   1. Left ventricular ejection fraction, by estimation, is 60 to 65%. The left ventricle has  normal function. The left ventricle has no regional wall motion abnormalities. Left ventricular diastolic parameters are consistent with Grade I diastolic dysfunction (impaired relaxation).  2. Right ventricular systolic function is normal. The right ventricular size is normal.  3. The mitral valve is normal in structure. Trivial mitral valve regurgitation. No evidence of mitral stenosis.  4. The aortic valve has an indeterminant number of cusps. Aortic valve regurgitation is trivial. No aortic stenosis is present.  5. The inferior vena cava is normal in size with greater than 50% respiratory variability, suggesting right atrial pressure of 3 mmHg.     09/20/2021 - 01/17/2022 Chemotherapy   Patient is on Treatment Plan : OVARIAN RECURRENT Liposomal Doxorubicin + Carboplatin q28d X 6 Cycles     09/20/2021 - 02/22/2022 Chemotherapy   Patient is on Treatment Plan : OVARIAN RECURRENT Liposomal Doxorubicin + Carboplatin q28d X 6 Cycles     09/23/2021 Tumor Marker   Patient's tumor was tested for the following markers: CA-125. Results of the tumor marker test revealed 27.7.   11/24/2021 Tumor Marker   Patient's tumor was tested for the following markers: CA-125. Results of the tumor marker test revealed 28.4.   12/13/2021 Imaging   1. Overall stable peritoneal carcinomatosis. No new disease is demonstrated. 2. Moderate left-sided hydroureteronephrosis likely due to compression of the left ureter by the left pelvic mass. 3. Stable left-sided renal calculi. 4. Numerous left renal calculi.     12/22/2021 Tumor Marker   Patient's tumor was tested for the following markers: CA-125. Results of the tumor marker test revealed 24.7.   01/18/2022 Tumor Marker   Patient's tumor was tested for the following markers: CA-125. Results of the tumor marker test revealed 9.9.   01/27/2022 Echocardiogram    1. Left ventricular ejection fraction, by estimation, is 60 to 65%. The left ventricle has normal function. The  left ventricle has no regional wall motion abnormalities. Left ventricular diastolic parameters are consistent with Grade I diastolic dysfunction (impaired relaxation). The average left ventricular global longitudinal strain is -21.1 %. The global longitudinal strain is normal.  2. Right ventricular systolic function is normal. The right ventricular size is normal.  3. The mitral valve is abnormal. Mild mitral valve regurgitation.  4. The aortic valve is tricuspid. Aortic valve regurgitation is trivial.  5. The inferior vena cava is normal in size with greater than 50% respiratory variability, suggesting right atrial pressure of 3 mmHg.   02/23/2022 Tumor Marker   Patient's tumor was tested for the following markers: CA-125. Results of the tumor marker test revealed 8.5.   03/18/2022 Imaging   1. Diminished size of nodular and mixed solid and cystic peritoneal implants in the hepatorenal fossa, right lower quadrant, and left pelvis. 2. Unchanged solid mass in the right pelvis measuring 4.0 x 3.7 cm. 3. Constellation of findings is overall consistent with treatment response 4. Status post hysterectomy, oophorectomy, and omentectomy. 5. Nonobstructive left nephrolithiasis.   Aortic Atherosclerosis (ICD10-I70.0).     03/22/2022 - 08/16/2022 Chemotherapy   Patient is on Treatment Plan : Ovarian Bevacizumab q21d     03/24/2022 Tumor Marker   Patient's tumor was tested for the following markers: CA-125. Results of the tumor marker test revealed 7.1.   04/28/2022 Tumor Marker   Patient's tumor was tested for  the following markers: CA-125. Results of the tumor marker test revealed 7.0.   05/27/2022 Tumor Marker   Patient's tumor was tested for the following markers: CA-125. Results of the tumor marker test revealed 7.7.   06/14/2022 Imaging   1. Slight response to therapy as evidenced by decrease in size of a cystic and solid mass in the left adnexa as well as peritoneal implants in the right  paracolic gutter. Solid right adnexal mass and additional peritoneal implants along right hepatic lobe and in the left anatomic pelvis appear stable. 2. Hepatic steatosis. 3. Left renal stones. 4.  Aortic atherosclerosis (ICD10-I70.0).   06/17/2022 Tumor Marker   Patient's tumor was tested for the following markers: CA-125. Results of the tumor marker test revealed 11.2.   09/05/2022 Imaging   CT ABDOMEN PELVIS W CONTRAST  Result Date: 09/06/2022 CLINICAL DATA:  Ovarian cancer.  Restaging.  * Tracking Code: BO * EXAM: CT ABDOMEN AND PELVIS WITH CONTRAST TECHNIQUE: Multidetector CT imaging of the abdomen and pelvis was performed using the standard protocol following bolus administration of intravenous contrast. RADIATION DOSE REDUCTION: This exam was performed according to the departmental dose-optimization program which includes automated exposure control, adjustment of the mA and/or kV according to patient size and/or use of iterative reconstruction technique. CONTRAST:  OMNIPAQUE IOHEXOL 300 MG/ML  SOLN COMPARISON:  06/13/2022 FINDINGS: Lower chest: Unremarkable. Hepatobiliary: No suspicious focal abnormality within the liver parenchyma. There is no evidence for gallstones, gallbladder wall thickening, or pericholecystic fluid. No intrahepatic or extrahepatic biliary dilation. Pancreas: No pancreatic mass lesion evident. Diffuse prominence of the main pancreatic duct is stable measuring up to 3-4 mm diameter in the head of the pancreas. Spleen: No splenomegaly. No focal mass lesion. Adrenals/Urinary Tract: No adrenal nodule or mass. Right kidney unremarkable. A cluster of nonobstructing stones in the upper pole right kidney is again noted with dominant stone measuring about 6 mm in size. No evidence for hydroureter. The urinary bladder appears normal for the degree of distention. Stomach/Bowel: Tiny hiatal hernia. Stomach otherwise unremarkable. Duodenum is normally positioned as is the ligament of  Treitz. No small bowel wall thickening. No small bowel dilatation. The terminal ileum is normal. The appendix is normal. No gross colonic mass. No colonic wall thickening. Diverticular changes are noted in the left colon without evidence of diverticulitis. Vascular/Lymphatic: There is mild atherosclerotic calcification of the abdominal aorta without aneurysm. There is no gastrohepatic or hepatoduodenal ligament lymphadenopathy. No retroperitoneal or mesenteric lymphadenopathy. No pelvic sidewall lymphadenopathy. Reproductive: Right-sided adnexal mass measured previously at 4.2 x 3.8 cm is now 4.9 x 4.6 cm. Cystic and solid left adnexal lesion measured previously at 4.1 x 3.4 cm is now 3.2 x 2.9 cm. 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 The more anterior of the 2 index lesions measures 2.6 x 2.1 cm today compared to 2.4 x 1.6 cm previously. The more posterior lesion is 2.4 x 1.4 cm today compared to 2.0 x 1.9 cm previously. 5 mm soft tissue nodule identified in the inferior paracolic gutter previously at 5 mm measures 10 mm today on image 51/2. Implants along the left posterior peritoneum adjacent to the psoas muscle and iliac vessels appear progressive. 1.6 x 1.3 cm lesion visible on 58/2 was 1.1 x 0.8 cm previously (remeasured). No evidence for ascites. Musculoskeletal: No worrisome lytic or sclerotic osseous abnormality. IMPRESSION: 1. Overall generalized appearance of mild disease progression. 2. Index right-sided adnexal mass has progressed since the prior with interval decrease  in size of the cystic and solid left adnexal lesion. 3. Interval progression of index capsular implants along the medial right liver. 4. Interval progression of soft tissue nodule in the inferior right paracolic gutter and peritoneal implants in the lower left abdomen and pelvis. 5. No evidence for ascites. 6. Nonobstructing left renal stones. 7. Tiny hiatal hernia. 8. Left colonic diverticulosis without  diverticulitis. 9.  Aortic Atherosclerosis (ICD10-I70.0). Electronically Signed   By: Kennith Center M.D.   On: 09/06/2022 07:57      09/15/2022 - 03/31/2023 Chemotherapy   Patient is on Treatment Plan : OVARIAN Paclitaxel (175) q21d X 6 Cycles     11/16/2022 Imaging   CT ABDOMEN PELVIS W CONTRAST  Result Date: 11/16/2022 CLINICAL DATA:  Ovarian carcinoma. Assess treatment response. * Tracking Code: BO * EXAM: CT ABDOMEN AND PELVIS WITH CONTRAST TECHNIQUE: Multidetector CT imaging of the abdomen and pelvis was performed using the standard protocol following bolus administration of intravenous contrast. RADIATION DOSE REDUCTION: This exam was performed according to the departmental dose-optimization program which includes automated exposure control, adjustment of the mA and/or kV according to patient size and/or use of iterative reconstruction technique. CONTRAST:  OMNIPAQUE IOHEXOL 300 MG/ML  SOLN COMPARISON:  None Available. FINDINGS: Lower chest: Lung bases are clear. Hepatobiliary: No focal hepatic lesion. Normal gallbladder. No biliary duct dilatation. Common bile duct is normal. Peritoneal implant described along the margin the RIGHT hepatic liver is much less conspicuous. Capsular lesion measuring 12 mm on image 14/2 is decreased from 26 mm. Pancreas: Pancreas is normal. No ductal dilatation. No pancreatic inflammation. Spleen: Normal spleen Adrenals/urinary tract: Adrenal glands normal. Multiple calculi within LEFT kidney without obstruction. Ureters and bladder normal. Stomach/Bowel: Stomach, small bowel, appendix, and cecum are normal. Multiple diverticula of the descending colon and sigmoid colon without acute inflammation. Vascular/Lymphatic: Abdominal aorta is normal caliber. No periportal or retroperitoneal adenopathy. No pelvic adenopathy. Reproductive: Solid enhancing mass in the deep RIGHT pelvis adjacent the rectum measures 4.0 x 4.3 cm compared to 4.6 x 4.9 cm. Small adjacent satellite  nodule measuring 1.1 cm (image 63) is unchanged. Cystic lesion of the LEFT adnexa with mural nodularity measures 3.0 x 2.4 cm compared to 3.2 x 2.9 cm. No new nodularity in the pelvis. Post hysterectomy. Other: Interval decrease in the peritoneal nodularity. For example peritoneal nodule adjacent to the cecum in the RIGHT iliac fossa measures 6 mm (image 43/2 compared to 10 mm on prior. Lesions on the LEFT iliac vessels less prominent. For example 7 mm lesion on 50/2 decreased from 16 mm. Musculoskeletal: No aggressive osseous lesion. IMPRESSION: 1. Interval decrease in size of RIGHT pelvic mass. 2. Interval decrease in size of LEFT adnexal cystic mass. 3. Interval decrease in size of peritoneal nodularity. 4. Interval decrease in size of perihepatic peritoneal implant. 5. No new metastatic disease. Electronically Signed   By: Genevive Bi M.D.   On: 11/16/2022 14:34      02/08/2023 Imaging   CT ABDOMEN PELVIS W CONTRAST  Result Date: 02/08/2023 CLINICAL DATA:  Ovarian carcinoma.  Assess treatment response. EXAM: CT ABDOMEN AND PELVIS WITH CONTRAST TECHNIQUE: Multidetector CT imaging of the abdomen and pelvis was performed using the standard protocol following bolus administration of intravenous contrast. RADIATION DOSE REDUCTION: This exam was performed according to the departmental dose-optimization program which includes automated exposure control, adjustment of the mA and/or kV according to patient size and/or use of iterative reconstruction technique. CONTRAST:  OMNIPAQUE IOHEXOL 300 MG/ML  SOLN  COMPARISON:  CT 11/14/2022 FINDINGS: Lower chest: Lung bases are clear. Hepatobiliary: No focal hepatic lesion. Normal gallbladder. No biliary duct dilatation. Common bile duct is normal. Small peritoneal implant along the RIGHT hepatic lobe is less conspicuous at 6 mm (image 18/2) compared to 11 mm on prior. Pancreas: Pancreas is normal. No ductal dilatation. No pancreatic inflammation. Spleen: Normal  spleen Adrenals/urinary tract: Adrenal glands normal. Multiple nonobstructing calculi in the upper pole of the LEFT kidney. Ureters and bladder normal. Stomach/Bowel: Stomach, small bowel, appendix, and cecum are normal. The colon and rectosigmoid colon are normal. Vascular/Lymphatic: Abdominal aorta is normal caliber with atherosclerotic calcification. There is no retroperitoneal or periportal lymphadenopathy. No pelvic lymphadenopathy. Reproductive: Post hysterectomy anatomy. Again demonstrated rounded mass posterior to the vaginal cuff just RIGHT of midline measures 3.7 by 3.0 cm (image 69/series 2) compared to 4.3 x 4.0 cm on most recent CT. Part cystic and solid lesion in the LEFT adnexa measures 2.2 x 1.6 cm (image 65) compared to 3.0 x 2.2 cm. Previously measured peritoneal implant in the deep RIGHT pelvis is no longer identified. No new mass lesions within the pelvis. Other: No new peritoneal disease.  No free fluid. Musculoskeletal: No aggressive osseous lesion. IMPRESSION: 1. Interval decrease in size rounded mass in the deep RIGHT pelvis. 2. Interval decrease in size of cystic LEFT adnexal lesion. 3. Decrease in size of peritoneal implants. 4. No evidence of new metastatic disease. No intraperitoneal free fluid Electronically Signed   By: Genevive Bi M.D.   On: 02/08/2023 10:56      04/10/2023 Imaging   CT ABDOMEN PELVIS W CONTRAST  Result Date: 04/13/2023 CLINICAL DATA:  Ovarian cancer.  Restaging.  * Tracking Code: BO * EXAM: CT ABDOMEN AND PELVIS WITH CONTRAST TECHNIQUE: Multidetector CT imaging of the abdomen and pelvis was performed using the standard protocol following bolus administration of intravenous contrast. RADIATION DOSE REDUCTION: This exam was performed according to the departmental dose-optimization program which includes automated exposure control, adjustment of the mA and/or kV according to patient size and/or use of iterative reconstruction technique. CONTRAST:   OMNIPAQUE IOHEXOL 300 MG/ML  SOLN COMPARISON:  01/31/2023 FINDINGS: Lower chest: No acute findings. Hepatobiliary: No suspicious focal abnormality within the liver parenchyma. There is no evidence for gallstones, gallbladder wall thickening, or pericholecystic fluid. No intrahepatic or extrahepatic biliary dilation. Pancreas: No focal mass lesion. No dilatation of the main duct. No intraparenchymal cyst. No peripancreatic edema. Spleen: No splenomegaly. No suspicious focal mass lesion. Adrenals/Urinary Tract: No adrenal nodule or mass. Right kidney unremarkable. Clustered nonobstructing stones are seen in the upper pole left kidney measuring up to 7 mm diameter. No evidence for hydroureter. The urinary bladder appears normal for the degree of distention. Stomach/Bowel: Stomach is unremarkable. No gastric wall thickening. No evidence of outlet obstruction. Duodenum is normally positioned as is the ligament of Treitz. No small bowel wall thickening. No small bowel dilatation. The terminal ileum is normal. The appendix is normal. No gross colonic mass. No colonic wall thickening. Diverticular changes are noted in the left colon without evidence of diverticulitis. Vascular/Lymphatic: There is mild atherosclerotic calcification of the abdominal aorta without aneurysm. There is no gastrohepatic or hepatoduodenal ligament lymphadenopathy. No retroperitoneal or mesenteric lymphadenopathy. No pelvic sidewall lymphadenopathy. Reproductive: Uterus surgically absent. Right paramidline pelvic mass lesion again evident. This is just posterior to the right vaginal cuff measuring 4.2 x 4.2 cm on image 68/2 today which compares to 3.7 x 3.1 cm previously. Cystic lesion in the  left adnexal space is also progressive measuring 2.5 x 1.9 cm today compared to 2.2 x 1.6 cm previously. There is some enhancing soft tissue along the antro inferior aspect of this lesion (see image 65/2). No new mesenteric or peritoneal lesion evident. Other:  No intraperitoneal free fluid. Musculoskeletal: No worrisome lytic or sclerotic osseous abnormality. IMPRESSION: 1. Interval progression of the right pelvic lesion along the vaginal cuff measuring 4.2 x 4.2 cm today compared to 3.7 x 3.1 cm previously. Cystic lesion in the left adnexal space is also progressive measuring 2.5 x 1.9 cm today compared to 2.2 x 1.6 cm previously. 2. No new mesenteric or peritoneal lesion evident. 3. Clustered nonobstructing stones in the upper pole left kidney measuring up to 7 mm diameter. 4. Left colonic diverticulosis without diverticulitis. 5.  Aortic Atherosclerosis (ICD10-I70.0). Electronically Signed   By: Kennith Center M.D.   On: 04/13/2023 14:04   DG Chest 2 View  Result Date: 03/22/2023 CLINICAL DATA:  Cough EXAM: CHEST - 2 VIEW COMPARISON:  None Available. FINDINGS: Right-sided chest port terminates at the level of the distal SVC. The heart size and mediastinal contours are within normal limits. Both lungs are clear. The visualized skeletal structures are unremarkable. IMPRESSION: No active cardiopulmonary disease. Electronically Signed   By: Duanne Guess D.O.   On: 03/22/2023 16:29       04/18/2023 Echocardiogram       1. Left ventricular ejection fraction, by estimation, is 60 to 65%. The left ventricle has normal function. The left ventricle has no regional wall motion abnormalities. There is mild left ventricular hypertrophy. Left ventricular diastolic parameters  are consistent with Grade I diastolic dysfunction (impaired relaxation). The average left ventricular global longitudinal strain is -19.3 %. The global longitudinal strain is normal.  2. Right ventricular systolic function is normal. The right ventricular size is normal. Tricuspid regurgitation signal is inadequate for assessing PA pressure.  3. The mitral valve is normal in structure. Trivial mitral valve regurgitation. No evidence of mitral stenosis.  4. The aortic valve was not well  visualized. Aortic valve regurgitation is trivial. No aortic stenosis is present.  5. The inferior vena cava is normal in size with greater than 50% respiratory variability, suggesting right atrial pressure of 3 mmHg.   04/26/2023 - 06/22/2023 Chemotherapy   Patient is on Treatment Plan : OVARIAN Liposomal Doxorubicin (40) q28d     07/07/2023 Imaging   CT CHEST ABDOMEN PELVIS W CONTRAST Result Date: 07/10/2023 CLINICAL DATA:  History of recurrent ovarian cancer, follow-up. * Tracking Code: BO * EXAM: CT CHEST, ABDOMEN, AND PELVIS WITH CONTRAST TECHNIQUE: Multidetector CT imaging of the chest, abdomen and pelvis was performed following the standard protocol during bolus administration of intravenous contrast. RADIATION DOSE REDUCTION: This exam was performed according to the departmental dose-optimization program which includes automated exposure control, adjustment of the mA and/or kV according to patient size and/or use of iterative reconstruction technique. CONTRAST:  OMNIPAQUE IOHEXOL 300 MG/ML  SOLN COMPARISON:  Multiple priors including CT April 10, 2023 FINDINGS: CT CHEST FINDINGS Cardiovascular: Right chest Port-A-Cath with tip near the superior cavoatrial junction. Normal caliber thoracic aorta. Normal size heart. Mediastinum/Nodes: No suspicious thyroid nodule. No pathologically enlarged mediastinal, hilar or axillary lymph nodes. Esophagus is grossly unremarkable. Lungs/Pleura: Biapical pleuroparenchymal scarring. 4 mm left lower lobe pulmonary nodule on image 62/6 stable from coronary CT Oct 06, 2021 suggestive of a benign finding. No new suspicious pulmonary nodules or masses. Musculoskeletal: No aggressive lytic or blastic  lesion of bone. CT ABDOMEN PELVIS FINDINGS Hepatobiliary: New heterogeneous 2.3 cm subcapsular segment VI hepatic lesion on image 57/2 and 14 mm segment VI/VII hepatic lesion on image 55/2. Gallbladder is unremarkable.  No biliary ductal dilation. Pancreas: No pancreatic  ductal dilation or evidence of acute inflammation. Spleen: No splenomegaly. Adrenals/Urinary Tract: No suspicious adrenal nodule/mass. Hydronephrosis. Nonobstructive left renal stones measure up to 6 mm. Kidneys demonstrate symmetric enhancement. Mild symmetric wall thickening of an incompletely distended urinary bladder. Stomach/Bowel: Stomach is unremarkable for degree of distension. No pathologic dilation of small or large bowel. No evidence of acute bowel inflammation. Vascular/Lymphatic: Normal caliber abdominal aorta. Aortic atherosclerosis. Smooth IVC contours. The portal, splenic and superior mesenteric veins are patent. Increased size of the low-density left external iliac lymph node now measuring 14 mm in short axis on image 93/2 previously 7 mm. Reproductive: Increased size of the heterogeneous lesion in the arising from the right vaginal cuff now measuring 4.9 x 3.4 cm on image 105/2 previously 2.5 x 1.9 cm. Left ovarian cystic lesion with persistent enhancing soft tissue along the margin of the lesion for instance on image 101/2 now in total measures 5.3 x 5.1 cm on image 108/2 previously 4.2 x 4.2 cm. Other: Increased abdominopelvic free fluid with a new loculated collection in the pelvis. Few new scattered soft tissue nodules in the omentum/peritoneum for instance along the right pericolic gutter measuring 7 mm on image 93/2 and in the left upper quadrant measuring 5 mm on image 53/2. Musculoskeletal: No aggressive lytic or blastic lesion of bone. IMPRESSION: 1. Increased size of the heterogeneous lesion arising from the right vaginal cuff, increased size of the left ovarian cystic lesion with persistent enhancing soft tissue along the margin of the lesion, and increased size of the low-density left external iliac lymph node, compatible with worsening disease. 2. Increased abdominopelvic free fluid with a new loculated collection in the pelvis and a few new scattered soft tissue nodules in the  omentum/peritoneum, compatible with peritoneal carcinomatosis. 3. New heterogeneous 2.3 cm subcapsular segment VI hepatic lesion and 14 mm segment VI/VII hepatic lesion, compatible with hepatic metastatic disease. 4. No evidence of metastatic disease in the chest. 5. Nonobstructive left renal stones measure up to 6 mm. 6. Mild symmetric wall thickening of an incompletely distended urinary bladder. Correlate with urinalysis to exclude cystitis. 7.  Aortic Atherosclerosis (ICD10-I70.0). Electronically Signed   By: Maudry Mayhew M.D.   On: 07/10/2023 11:39      07/20/2023 -  Chemotherapy   Patient is on Treatment Plan : Ovarian Docetaxel (100) q21d       MEDICAL HISTORY:  Past Medical History:  Diagnosis Date   Anxiety state, unspecified    Diverticulosis    Esophageal reflux    Family history of breast cancer    Family history of lymphoma    Family history of stomach cancer    Family history of uterine cancer    Fibroid tumor    Headache    due to allergies    Hiatal hernia    Hyperlipidemia    IBS (irritable bowel syndrome)    Internal hemorrhoids    Nonspecific elevation of levels of transaminase or lactic acid dehydrogenase (LDH)    ovarian ca dx'd 11/2018   Renal stone 07/2013   Tubulovillous adenoma of colon    Varicose veins    superficail thrombophlebitis (left LE)    SURGICAL HISTORY: Past Surgical History:  Procedure Laterality Date   CATARACT EXTRACTION, BILATERAL  cystoscopy with instillation for cystitis  2001   DEBULKING N/A 02/12/2019   Procedure: DEBULKING OF TUMOR;  Surgeon: Adolphus Birchwood, MD;  Location: [facility];  Service: Gynecology;  Laterality: N/A;   DILATION AND CURETTAGE OF UTERUS     miscarriage   HYSTERECTOMY ABDOMINAL WITH SALPINGO-OOPHORECTOMY Bilateral 02/12/2019   Procedure: HYSTERECTOMY ABDOMINAL WITH SALPINGO-OOPHORECTOMY;  Surgeon: Adolphus Birchwood, MD;  Location: [facility];  Service: Gynecology;  Laterality: Bilateral;   IR IMAGING GUIDED PORT INSERTION   12/06/2018   IR IMAGING GUIDED PORT INSERTION  10/10/2019   IR PARACENTESIS  12/03/2018   IR PARACENTESIS  12/14/2018   IR REMOVAL TUN ACCESS W/ PORT W/O FL MOD SED  06/03/2019   LAPAROSCOPY     adhesions   LITHOTRIPSY  07/2013   x 2   MYOMECTOMY  1992   OMENTECTOMY N/A 02/12/2019   Procedure: OMENTECTOMY;  Surgeon: Adolphus Birchwood, MD;  Location: [facility];  Service: Gynecology;  Laterality: N/A;   PARACENTESIS  11/21/2018   abdominal , removed 3.1 Liters     SOCIAL HISTORY: Social History   Socioeconomic History   Marital status: Married    Spouse name: Not on file   Number of children: 0   Years of education: Not on file   Highest education level: Not on file  Occupational History   Occupation: sales  Tobacco Use   Smoking status: Former    Current packs/day: 0.00    Types: Cigarettes    Start date: 05/23/1970    Quit date: 05/23/1980    Years since quitting: 43.1   Smokeless tobacco: Never   Tobacco comments:    quit 1984  Vaping Use   Vaping status: Never Used  Substance and Sexual Activity   Alcohol use: Yes    Alcohol/week: 0.0 standard drinks of alcohol    Comment: 2/day, 11-21-2018   no alcodholint he last month    Drug use: No   Sexual activity: Yes    Partners: Male  Other Topics Concern   Not on file  Social History Narrative   Not on file   Social Drivers of Health   Financial Resource Strain: Not on file  Food Insecurity: Not on file  Transportation Needs: Not on file  Physical Activity: Not on file  Stress: Not on file  Social Connections: Not on file  Intimate Partner Violence: Not on file    FAMILY HISTORY: Family History  Problem Relation Age of Onset   Lymphoma Father        dx 59s   Heart disease Father    Hypertension Father    Hypertension Mother    Hyperlipidemia Mother    Breast cancer Mother 57   Diabetes Maternal Grandmother    Uterine cancer Maternal Grandmother        dx 61s   Stomach cancer Paternal Grandfather    Breast cancer  Other        grandmother's sisters, both dx 32s   Colon cancer Neg Hx     ALLERGIES:  is allergic to carboplatin, skin adhesives [cyanoacrylate], shrimp [shellfish allergy], sulfasalazine, sulfonamide derivatives, and telithromycin.  MEDICATIONS:  No current facility-administered medications for this encounter.   Current Outpatient Medications  Medication Sig Dispense Refill   lactulose (CHRONULAC) 10 GM/15ML solution Take 15 mLs (10 g total) by mouth 2 (two) times daily. 473 mL 0   magnesium oxide (MAG-OX) 400 (240 Mg) MG tablet Take 1 tablet (400 mg total) by mouth daily. 30 tablet 1  ALPRAZolam (XANAX) 0.25 MG tablet Take 1 tablet (0.25 mg total) by mouth 2 (two) times daily as needed for anxiety or sleep. 60 tablet 0   carboxymethylcellulose (REFRESH PLUS) 0.5 % SOLN Place 1 drop into both eyes 2 (two) times daily as needed (dry eyes).     Cholecalciferol (VITAMIN D3) 25 MCG (1000 UT) CAPS Take 1,000 Units by mouth daily.      dexamethasone (DECADRON) 4 MG tablet Take 2 tablets by mouth starting day before chemo. Then take 2 tablets daily for 2 days starting day after chemo. Take with food. 30 tablet 1   diphenhydrAMINE (BENADRYL ALLERGY) 25 MG tablet Take by mouth.     escitalopram (LEXAPRO) 10 MG tablet 1 po qd Oral     esomeprazole (NEXIUM) 20 MG capsule Take 20 mg by mouth daily.      estradiol (ESTRACE VAGINAL) 0.1 MG/GM vaginal cream Place 1 applicatorful vaginally 3 (three) times a week. 42.5 g 12   lidocaine-prilocaine (EMLA) cream Apply topically as needed. 30 g 1   lisinopril (ZESTRIL) 10 MG tablet Take 1 tablet (10 mg total) by mouth daily.     loratadine (CLARITIN) 10 MG tablet Take 10 mg by mouth daily as needed for allergies.     Multiple Vitamins-Minerals (MULTI FOR HER PO) 1 poq     mupirocin ointment (BACTROBAN) 2 % 1 application Externally Twice a day     ondansetron (ZOFRAN) 8 MG tablet Take 1 tablet by mouth 2 times daily as needed. Start on the third day after  chemotherapy. 30 tablet 1   ondansetron (ZOFRAN) 8 MG tablet Take 1 tablet (8 mg total) by mouth every 8 (eight) hours as needed for nausea or vomiting. 30 tablet 1   oxyCODONE (OXY IR/ROXICODONE) 5 MG immediate release tablet Take 1 tablet (5 mg total) by mouth every 4 (four) hours as needed for severe pain (pain score 7-10). 30 tablet 0   pantoprazole (PROTONIX) 40 MG tablet 1 po qd on an empty stomach Oral     polyethylene glycol (MIRALAX / GLYCOLAX) 17 g packet Take 17 g by mouth 2 (two) times daily.     prochlorperazine (COMPAZINE) 10 MG tablet Take 1 tablet by mouth every 6 hours as needed (Nausea or vomiting). 30 tablet 1   senna (SENOKOT) 8.6 MG tablet Take 2 tablets by mouth 3 (three) times daily.      REVIEW OF SYSTEMS:   All relevant systems were reviewed with the patient and are negative.  PHYSICAL EXAMINATION: ECOG PERFORMANCE STATUS: 1 - Symptomatic but completely ambulatory  Vitals:   07/16/23 1100 07/16/23 1206  BP: 132/78 (!) 140/68  Pulse: 86 78  Resp: 18 18  Temp:    SpO2: 97% 100%   Filed Weights   07/16/23 1001  Weight: 144 lb (65.3 kg)    GENERAL: alert, no distress and comfortable SKIN: skin color normal. No jaundice EYES:  sclera clear OROPHARYNX: no exudate, dry NECK: supple. No mass LYMPH:  no palpable cervical lymphadenopathy LUNGS: clear to auscultation and normal breathing effort.  No wheeze or rales HEART: regular rate & rhythm and no murmurs ABDOMEN:abdomen soft, non-tender  Musculoskeletal:  no lower extremity edema NEURO: alert & oriented with fluent speech   LABORATORY DATA:  I have reviewed the data as listed Lab Results  Component Value Date   WBC 5.3 07/16/2023   HGB 12.1 07/16/2023   HCT 37.2 07/16/2023   MCV 96.6 07/16/2023   PLT 300 07/16/2023  Recent Labs    05/25/23 1147 06/22/23 0801 07/16/23 1128  NA 141 139 138  K 3.7 3.7 3.1*  CL 107 105 98  CO2 28 29 23   GLUCOSE 95 98 109*  BUN 13 12 16   CREATININE 0.54 0.55  0.69  CALCIUM 9.5 9.4 9.3  GFRNONAA >60 >60 >60  PROT 6.5 6.2* 6.7  ALBUMIN 4.2 3.9 3.3*  AST 25 26 27   ALT 23 21 22   ALKPHOS 74 93 156*  BILITOT 1.2 1.2 1.3*    RADIOGRAPHIC STUDIES: I have personally reviewed the radiological images as listed and agreed with the findings in the report. CT ABDOMEN PELVIS WO CONTRAST Result Date: 07/16/2023 CLINICAL DATA:  Generalized abdominal pain for 4 days. History of ovarian cancer. EXAM: CT ABDOMEN AND PELVIS WITHOUT CONTRAST TECHNIQUE: Multidetector CT imaging of the abdomen and pelvis was performed following the standard protocol without IV contrast. RADIATION DOSE REDUCTION: This exam was performed according to the departmental dose-optimization program which includes automated exposure control, adjustment of the mA and/or kV according to patient size and/or use of iterative reconstruction technique. COMPARISON:  July 07, 2023 FINDINGS: Lower chest: No acute abnormality. Hepatobiliary: No focal liver abnormality is seen. No gallstones, gallbladder wall thickening, or biliary dilatation. Pancreas: Unremarkable. No pancreatic ductal dilatation or surrounding inflammatory changes. Spleen: Normal in size without focal abnormality. Adrenals/Urinary Tract: Adrenal glands appear normal. Nonobstructive left nephrolithiasis is noted. Mild bilateral hydroureteronephrosis is noted without obstructing calculus. This is concerning for distal ureteral obstruction. Urinary bladder is decompressed. Stomach/Bowel: Stomach and appendix are unremarkable. No significant small bowel dilatation is noted. However, there is severe colonic dilatation which appears to be due to distal sigmoid colon obstruction, most likely due to external compression from adjacent peritoneal implant or malignancy. Vascular/Lymphatic: Aortic atherosclerosis. No significant adenopathy is noted currently. Reproductive: Status post hysterectomy. 5.3 x 3.0 cm left adnexal cystic lesion is noted which is  not significantly changed compared to prior exam. 11.5 x 9.1 cm complex cystic abnormality is noted in the pelvis with large peripheral solid components. This is concerning for cystic ovarian malignancy or metastatic disease or possibly loculated ascites and adjacent peritoneal carcinomatosis. Other: No definite hernia is noted. Musculoskeletal: No acute or significant osseous findings. IMPRESSION: Interval development of severe diffuse colonic dilatation with complete obstruction at the level of the distal sigmoid colon, most likely due to external compression from adjacent malignancy or peritoneal implant given history of ovarian carcinoma. There is noted 11.5 x 9.1 cm complex cystic abnormality in the pelvis with large peripheral solid components which is concerning for cystic ovarian malignancy or metastatic disease, or possibly loculated ascites with adjacent peritoneal carcinomatosis. Nonobstructive left nephrolithiasis is noted. Mild bilateral hydroureteronephrosis is noted without obstructing calculus, concerning for distal ureteral obstruction secondary to neoplasm. Aortic Atherosclerosis (ICD10-I70.0). Electronically Signed   By: Lupita Raider M.D.   On: 07/16/2023 11:53   DG Abdomen 1 View Result Date: 07/16/2023 CLINICAL DATA:  Abdominal pain, constipation. EXAM: ABDOMEN - 1 VIEW COMPARISON:  June 07, 2018. FINDINGS: No definite small bowel dilatation is noted. Moderately dilated and air-filled colon is noted most consistent with ileus. Or clips are noted in the pelvis. IMPRESSION: Moderately dilated and air-filled colon is noted concerning for ileus. Electronically Signed   By: Lupita Raider M.D.   On: 07/16/2023 10:56   CT CHEST ABDOMEN PELVIS W CONTRAST Result Date: 07/10/2023 CLINICAL DATA:  History of recurrent ovarian cancer, follow-up. * Tracking Code: BO * EXAM: CT CHEST,  ABDOMEN, AND PELVIS WITH CONTRAST TECHNIQUE: Multidetector CT imaging of the chest, abdomen and pelvis was  performed following the standard protocol during bolus administration of intravenous contrast. RADIATION DOSE REDUCTION: This exam was performed according to the departmental dose-optimization program which includes automated exposure control, adjustment of the mA and/or kV according to patient size and/or use of iterative reconstruction technique. CONTRAST:  OMNIPAQUE IOHEXOL 300 MG/ML  SOLN COMPARISON:  Multiple priors including CT April 10, 2023 FINDINGS: CT CHEST FINDINGS Cardiovascular: Right chest Port-A-Cath with tip near the superior cavoatrial junction. Normal caliber thoracic aorta. Normal size heart. Mediastinum/Nodes: No suspicious thyroid nodule. No pathologically enlarged mediastinal, hilar or axillary lymph nodes. Esophagus is grossly unremarkable. Lungs/Pleura: Biapical pleuroparenchymal scarring. 4 mm left lower lobe pulmonary nodule on image 62/6 stable from coronary CT Oct 06, 2021 suggestive of a benign finding. No new suspicious pulmonary nodules or masses. Musculoskeletal: No aggressive lytic or blastic lesion of bone. CT ABDOMEN PELVIS FINDINGS Hepatobiliary: New heterogeneous 2.3 cm subcapsular segment VI hepatic lesion on image 57/2 and 14 mm segment VI/VII hepatic lesion on image 55/2. Gallbladder is unremarkable.  No biliary ductal dilation. Pancreas: No pancreatic ductal dilation or evidence of acute inflammation. Spleen: No splenomegaly. Adrenals/Urinary Tract: No suspicious adrenal nodule/mass. Hydronephrosis. Nonobstructive left renal stones measure up to 6 mm. Kidneys demonstrate symmetric enhancement. Mild symmetric wall thickening of an incompletely distended urinary bladder. Stomach/Bowel: Stomach is unremarkable for degree of distension. No pathologic dilation of small or large bowel. No evidence of acute bowel inflammation. Vascular/Lymphatic: Normal caliber abdominal aorta. Aortic atherosclerosis. Smooth IVC contours. The portal, splenic and superior mesenteric veins  are patent. Increased size of the low-density left external iliac lymph node now measuring 14 mm in short axis on image 93/2 previously 7 mm. Reproductive: Increased size of the heterogeneous lesion in the arising from the right vaginal cuff now measuring 4.9 x 3.4 cm on image 105/2 previously 2.5 x 1.9 cm. Left ovarian cystic lesion with persistent enhancing soft tissue along the margin of the lesion for instance on image 101/2 now in total measures 5.3 x 5.1 cm on image 108/2 previously 4.2 x 4.2 cm. Other: Increased abdominopelvic free fluid with a new loculated collection in the pelvis. Few new scattered soft tissue nodules in the omentum/peritoneum for instance along the right pericolic gutter measuring 7 mm on image 93/2 and in the left upper quadrant measuring 5 mm on image 53/2. Musculoskeletal: No aggressive lytic or blastic lesion of bone. IMPRESSION: 1. Increased size of the heterogeneous lesion arising from the right vaginal cuff, increased size of the left ovarian cystic lesion with persistent enhancing soft tissue along the margin of the lesion, and increased size of the low-density left external iliac lymph node, compatible with worsening disease. 2. Increased abdominopelvic free fluid with a new loculated collection in the pelvis and a few new scattered soft tissue nodules in the omentum/peritoneum, compatible with peritoneal carcinomatosis. 3. New heterogeneous 2.3 cm subcapsular segment VI hepatic lesion and 14 mm segment VI/VII hepatic lesion, compatible with hepatic metastatic disease. 4. No evidence of metastatic disease in the chest. 5. Nonobstructive left renal stones measure up to 6 mm. 6. Mild symmetric wall thickening of an incompletely distended urinary bladder. Correlate with urinalysis to exclude cystitis. 7.  Aortic Atherosclerosis (ICD10-I70.0). Electronically Signed   By: Maudry Mayhew M.D.   On: 07/10/2023 11:39

## 2023-07-16 NOTE — ED Provider Notes (Signed)
 [place] EMERGENCY DEPARTMENT AT [facility] Provider Note   CSN: [id number] Arrival date & time: 07/16/23  6387     History  Chief Complaint  Patient presents with   Abdominal Pain    Stacy Rivas is a 68 y.o. female, history of ovarian cancer, metastatic, who presents to the ED secondary to no bowel movement for the last 6 days.  She went to her oncologist, about a week and a half ago, had a CT abdomen pelvis, done which showed worsening cancer, that was not responding to treatment, as well as some constipation.  Her oncologist, thought that her right upper quadrant abdominal pain was likely secondary to constipation, so prescribed her Senokot, and magnesium oxide, she has been taking that without relief.  Recently started taking lactulose twice daily, about 3 days ago, and has not had a bowel movement.  States states her last bowel movement was 6 days ago, and she feels like she is becoming more distended, and has reduced p.o. intake secondary to this.  She has not passed any gas.  Reports worsening right upper quadrant abdominal pain.  And states that she hears some gurgling in her abdomen.  Notes that she has difficult time bending over, secondary to the discomfort, and distention.    Home Medications Prior to Admission medications   Medication Sig Start Date End Date Taking? Authorizing Provider  lactulose (CHRONULAC) 10 GM/15ML solution Take 15 mLs (10 g total) by mouth 2 (two) times daily. 07/14/23   Artis Delay, MD  magnesium oxide (MAG-OX) 400 (240 Mg) MG tablet Take 1 tablet (400 mg total) by mouth daily. 07/14/23   Artis Delay, MD  ALPRAZolam Prudy Feeler) 0.25 MG tablet Take 1 tablet (0.25 mg total) by mouth 2 (two) times daily as needed for anxiety or sleep. 05/25/23   Artis Delay, MD  carboxymethylcellulose (REFRESH PLUS) 0.5 % SOLN Place 1 drop into both eyes 2 (two) times daily as needed (dry eyes).    [provider]  Cholecalciferol (VITAMIN D3) 25 MCG  (1000 UT) CAPS Take 1,000 Units by mouth daily.     [provider]  dexamethasone (DECADRON) 4 MG tablet Take 2 tablets by mouth starting day before chemo. Then take 2 tablets daily for 2 days starting day after chemo. Take with food. 07/11/23   Artis Delay, MD  diphenhydrAMINE (BENADRYL ALLERGY) 25 MG tablet Take by mouth. 05/03/17   [provider]  escitalopram (LEXAPRO) 10 MG tablet 1 po qd Oral 10/09/19   [provider]  esomeprazole (NEXIUM) 20 MG capsule Take 20 mg by mouth daily.     [provider]  estradiol (ESTRACE VAGINAL) 0.1 MG/GM vaginal cream Place 1 applicatorful vaginally 3 (three) times a week. 04/27/22   Artis Delay, MD  lidocaine-prilocaine (EMLA) cream Apply topically as needed. 04/14/23   Artis Delay, MD  lisinopril (ZESTRIL) 10 MG tablet Take 1 tablet (10 mg total) by mouth daily. 04/14/23   Artis Delay, MD  loratadine (CLARITIN) 10 MG tablet Take 10 mg by mouth daily as needed for allergies.    [provider]  Multiple Vitamins-Minerals (MULTI FOR HER PO) 1 poq    [provider]  mupirocin ointment (BACTROBAN) 2 % 1 application Externally Twice a day 01/23/22   [provider]  ondansetron (ZOFRAN) 8 MG tablet Take 1 tablet by mouth 2 times daily as needed. Start on the third day after chemotherapy. 09/13/21   Artis Delay, MD  ondansetron [facility]) 8  MG tablet Take 1 tablet (8 mg total) by mouth every 8 (eight) hours as needed for nausea or vomiting. 07/11/23   Artis Delay, MD  oxyCODONE (OXY IR/ROXICODONE) 5 MG immediate release tablet Take 1 tablet (5 mg total) by mouth every 4 (four) hours as needed for severe pain (pain score 7-10). 07/10/23   Artis Delay, MD  pantoprazole (PROTONIX) 40 MG tablet 1 po qd on an empty stomach Oral 08/24/18   [provider]  polyethylene glycol (MIRALAX / GLYCOLAX) 17 g packet Take 17 g by mouth 2 (two) times daily.    [provider]  prochlorperazine (COMPAZINE) 10  MG tablet Take 1 tablet by mouth every 6 hours as needed (Nausea or vomiting). 04/14/23   Artis Delay, MD  senna (SENOKOT) 8.6 MG tablet Take 2 tablets by mouth 3 (three) times daily.    [provider]      Allergies    Carboplatin, Skin adhesives [cyanoacrylate], Shrimp [shellfish allergy], Sulfasalazine, Sulfonamide derivatives, and Telithromycin    Review of Systems   Review of Systems  Gastrointestinal:  Positive for abdominal pain, constipation and nausea. Negative for diarrhea.    Physical Exam Updated Vital Signs BP (!) 140/68 (BP Location: Right Arm)   Pulse 78   Temp 97.8 F (36.6 C) (Oral)   Resp 18   Ht 5\' 2"  (1.575 m)   Wt 65.3 kg   SpO2 100%   BMI 26.34 kg/m  Physical Exam Vitals and nursing note reviewed.  Constitutional:      General: She is not in acute distress.    Appearance: She is well-developed.  HENT:     Head: Normocephalic and atraumatic.  Eyes:     Conjunctiva/sclera: Conjunctivae normal.  Cardiovascular:     Rate and Rhythm: Normal rate and regular rhythm.     Heart sounds: No murmur heard. Pulmonary:     Effort: Pulmonary effort is normal. No respiratory distress.     Breath sounds: Normal breath sounds.  Abdominal:     General: There is distension.     Palpations: Abdomen is soft.     Tenderness: There is generalized abdominal tenderness and tenderness in the right upper quadrant.  Musculoskeletal:        General: No swelling.     Cervical back: Neck supple.  Skin:    General: Skin is warm and dry.     Capillary Refill: Capillary refill takes less than 2 seconds.  Neurological:     Mental Status: She is alert.  Psychiatric:        Mood and Affect: Mood normal.     ED Results / Procedures / Treatments   Labs (all labs ordered are listed, but only abnormal results are displayed) Labs Reviewed  CBC WITH DIFFERENTIAL/PLATELET - Abnormal; Notable for the following components:      Result Value   RBC 3.85 (*)    All other  components within normal limits  COMPREHENSIVE METABOLIC PANEL - Abnormal; Notable for the following components:   Potassium 3.1 (*)    Glucose, Bld 109 (*)    Albumin 3.3 (*)    Alkaline Phosphatase 156 (*)    Total Bilirubin 1.3 (*)    Anion gap 17 (*)    All other components within normal limits    EKG None  Radiology CT ABDOMEN PELVIS WO CONTRAST Result Date: 07/16/2023 CLINICAL DATA:  Generalized abdominal pain for 4 days. History of ovarian cancer. EXAM: CT ABDOMEN AND PELVIS WITHOUT CONTRAST TECHNIQUE:  Multidetector CT imaging of the abdomen and pelvis was performed following the standard protocol without IV contrast. RADIATION DOSE REDUCTION: This exam was performed according to the departmental dose-optimization program which includes automated exposure control, adjustment of the mA and/or kV according to patient size and/or use of iterative reconstruction technique. COMPARISON:  July 07, 2023 FINDINGS: Lower chest: No acute abnormality. Hepatobiliary: No focal liver abnormality is seen. No gallstones, gallbladder wall thickening, or biliary dilatation. Pancreas: Unremarkable. No pancreatic ductal dilatation or surrounding inflammatory changes. Spleen: Normal in size without focal abnormality. Adrenals/Urinary Tract: Adrenal glands appear normal. Nonobstructive left nephrolithiasis is noted. Mild bilateral hydroureteronephrosis is noted without obstructing calculus. This is concerning for distal ureteral obstruction. Urinary bladder is decompressed. Stomach/Bowel: Stomach and appendix are unremarkable. No significant Carmeline Kowal bowel dilatation is noted. However, there is severe colonic dilatation which appears to be due to distal sigmoid colon obstruction, most likely due to external compression from adjacent peritoneal implant or malignancy. Vascular/Lymphatic: Aortic atherosclerosis. No significant adenopathy is noted currently. Reproductive: Status post hysterectomy. 5.3 x 3.0 cm left  adnexal cystic lesion is noted which is not significantly changed compared to prior exam. 11.5 x 9.1 cm complex cystic abnormality is noted in the pelvis with large peripheral solid components. This is concerning for cystic ovarian malignancy or metastatic disease or possibly loculated ascites and adjacent peritoneal carcinomatosis. Other: No definite hernia is noted. Musculoskeletal: No acute or significant osseous findings. IMPRESSION: Interval development of severe diffuse colonic dilatation with complete obstruction at the level of the distal sigmoid colon, most likely due to external compression from adjacent malignancy or peritoneal implant given history of ovarian carcinoma. There is noted 11.5 x 9.1 cm complex cystic abnormality in the pelvis with large peripheral solid components which is concerning for cystic ovarian malignancy or metastatic disease, or possibly loculated ascites with adjacent peritoneal carcinomatosis. Nonobstructive left nephrolithiasis is noted. Mild bilateral hydroureteronephrosis is noted without obstructing calculus, concerning for distal ureteral obstruction secondary to neoplasm. Aortic Atherosclerosis (ICD10-I70.0). Electronically Signed   By: Lupita Raider M.D.   On: 07/16/2023 11:53   DG Abdomen 1 View Result Date: 07/16/2023 CLINICAL DATA:  Abdominal pain, constipation. EXAM: ABDOMEN - 1 VIEW COMPARISON:  June 07, 2018. FINDINGS: No definite Dorien Bessent bowel dilatation is noted. Moderately dilated and air-filled colon is noted most consistent with ileus. Or clips are noted in the pelvis. IMPRESSION: Moderately dilated and air-filled colon is noted concerning for ileus. Electronically Signed   By: Lupita Raider M.D.   On: 07/16/2023 10:56    Procedures Procedures    Medications Ordered in ED Medications  sodium chloride 0.9 % bolus 1,000 mL (1,000 mLs Intravenous New Bag/Given 07/16/23 1132)  morphine (PF) 4 MG/ML injection 4 mg (4 mg Intravenous Given 07/16/23  1201)    ED Course/ Medical Decision Making/ A&P                                 Medical Decision Making Patient is a 68 year old female, here for abdominal pain, it has been going on for the last week, as well as a concern for constipation.  She has taken multiple laxatives without relief.  She was sent in by her oncologist, secondary to worsening pain, and no relief with laxatives.  She has not had a bowel movement in the last 6 days.  We will obtain a KUB initially, do evaluate for obstruction, and then if needed obtain  CT abdomen pelvis, given her complex history.  She does have metastatic ovarian cancer.  And per patient, the treatment is not helping.  She is distended, we will start IV fluids, to help with hydration as she has had limited p.o. intake, secondary to nausea/vomiting, and abdominal discomfort.  Will give morphine for pain control.  Amount and/or Complexity of Data Reviewed Labs: ordered.    Details: Mild hypokalemia 3.1, alk phos elevated Radiology: ordered.    Details: CT abdomen pelvis, shows large bowel obstruction secondary to external compression from adjacent malignancy or peritoneal implant Discussion of management or test interpretation with external provider(s): Discussed with patient, unfortunately CT abdomen pelvis shows pressure on bowel, causing bowel obstruction, for malignancy, resulting in large bowel obstruction.  I spoke to Dr. Doylene Canard, on surgery, and she recommends patient have diverting colostomy.  She will evaluate patient, and further provide recommendations.  Additionally I messaged Dr. Cherly Hensen and Dr. Gloris Manchester, about the patient, given oncology association/gynecologic oncology, issue.  Dr. [name] (the pt's oncologist) such was not available, to be reached out with 2, as she is offline.  Admitted to Dr. Jerolyn Center, for IV fluids, NG tube placement, and further surgical evaluation and oncologic valuation.  Orders for NG tube placed  Risk Prescription drug  management. Decision regarding hospitalization.    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

## 2023-07-16 NOTE — H&P (Signed)
 History and Physical    Patient: Stacy Rivas:[id number] DOB: 1955/10/02 DOA: 07/16/2023 DOS: the patient was seen and examined on 07/16/2023 PCP: Geoffry Paradise, MD  Patient coming from: Home-lives with husband and continues to work from home  Medical readiness/disposition: Anticipate could potentially be ready for discharge by 2/30/25 and will discharge back home with husband.  Chief Complaint:  Chief Complaint  Patient presents with   Abdominal Pain   HPI: Stacy Rivas is a 68 y.o. female with medical history significant of anxiety, GERD, IBS and a known diagnosis of left ovarian epithelial cancer for 3 years and has been treated by Dr. [name].  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  Current recommendations are for palliative chemotherapy and patient is well aware that chemo is not curative.  Plans were to progression docetaxel due to lack of improvement.  She has previously been on Doxil and bevacizumab but these were stopped due to disease progression.  Patient has been experiencing greater than 1 week of severe constipation that has not responded to laxative therapies.  She began developing severe crampy abdominal pain associated with emesis for the past 4 days.  CT abdomen and pelvis done today revealed interval development of severe diffuse colonic dilatation with complete obstruction at the level of the distal sigmoid colon due to compression from adjacent malignancy.  There is a notable peritoneal implant measuring 11.5 x 9.1 cm.  General surgery has been consulted and plans are to proceed with diverting colostomy in the next 24 to 48 hours.  Oncology has been consulted and has just now evaluated the patient.  We have been asked to admit the patient.  Upon my evaluation of the patient her recent administration of pain medicines about 2 hours ago is wearing off and she is  primarily complaining of abdominal discomfort.  She also complains of severe dry mouth and lips.  Prior to admission she had been unable to eat.  She characterizes her pain as very severe and colicky and sometimes burning.  The pain starts at the right mid abdomen and radiates up to the flank and also down to the right lower quadrant she also has bilateral lower abdominal pain as well.  We discussed the purpose of her colostomy and that the procedure is palliative in nature and the ultimate goal is to provide comfort for a bowel obstruction that is not otherwise surgically treated that is the causative mass cannot be removed.  She verbalizes understanding.  We discussed CODE STATUS.  She states that after she initially got her cancer diagnosis she completed living will papers.  At this time she wishes to remain a full code.  Review of Systems: As mentioned in the history of present illness. All other systems reviewed and are negative.   Past Medical History:  Diagnosis Date   Anxiety state, unspecified    Diverticulosis    Esophageal reflux    Family history of breast cancer    Family history of lymphoma    Family history of stomach cancer    Family history of uterine cancer    Fibroid tumor    Headache    due to allergies    Hiatal hernia    Hyperlipidemia    IBS (irritable bowel syndrome)    Internal hemorrhoids    Nonspecific elevation of levels of transaminase or lactic acid dehydrogenase (LDH)    ovarian ca  dx'd 11/2018   Renal stone 07/2013   Tubulovillous adenoma of colon    Varicose veins    superficail thrombophlebitis (left LE)   Past Surgical History:  Procedure Laterality Date   CATARACT EXTRACTION, BILATERAL     cystoscopy with instillation for cystitis  2001   DEBULKING N/A 02/12/2019   Procedure: DEBULKING OF TUMOR;  Surgeon: Adolphus Birchwood, MD;  Location: [facility];  Service: Gynecology;  Laterality: N/A;   DILATION AND CURETTAGE OF UTERUS     miscarriage   HYSTERECTOMY  ABDOMINAL WITH SALPINGO-OOPHORECTOMY Bilateral 02/12/2019   Procedure: HYSTERECTOMY ABDOMINAL WITH SALPINGO-OOPHORECTOMY;  Surgeon: Adolphus Birchwood, MD;  Location: [facility];  Service: Gynecology;  Laterality: Bilateral;   IR IMAGING GUIDED PORT INSERTION  12/06/2018   IR IMAGING GUIDED PORT INSERTION  10/10/2019   IR PARACENTESIS  12/03/2018   IR PARACENTESIS  12/14/2018   IR REMOVAL TUN ACCESS W/ PORT W/O FL MOD SED  06/03/2019   LAPAROSCOPY     adhesions   LITHOTRIPSY  07/2013   x 2   MYOMECTOMY  1992   OMENTECTOMY N/A 02/12/2019   Procedure: OMENTECTOMY;  Surgeon: Adolphus Birchwood, MD;  Location: [facility];  Service: Gynecology;  Laterality: N/A;   PARACENTESIS  11/21/2018   abdominal , removed 3.1 Liters    Social History:  reports that she quit smoking about 43 years ago. Her smoking use included cigarettes. She started smoking about 53 years ago. She has never used smokeless tobacco. She reports current alcohol use. She reports that she does not use drugs.  Allergies  Allergen Reactions   Carboplatin Other (See Comments)    Chest tightness/burning, flushing.  See Progress Note from 12/29/2022.   Skin Adhesives [Cyanoacrylate] Hives   Shrimp [Shellfish Allergy] Itching   Sulfasalazine Nausea And Vomiting   Sulfonamide Derivatives Nausea Only   Telithromycin     Other Reaction(s): severe dizziness    Family History  Problem Relation Age of Onset   Lymphoma Father        dx 19s   Heart disease Father    Hypertension Father    Hypertension Mother    Hyperlipidemia Mother    Breast cancer Mother 21   Diabetes Maternal Grandmother    Uterine cancer Maternal Grandmother        dx 61s   Stomach cancer Paternal Grandfather    Breast cancer Other        grandmother's sisters, both dx 18s   Colon cancer Neg Hx     Prior to Admission medications   Medication Sig Start Date End Date Taking? Authorizing Provider  lactulose (CHRONULAC) 10 GM/15ML solution Take 15 mLs (10 g total) by mouth 2 (two)  times daily. 07/14/23   Artis Delay, MD  magnesium oxide (MAG-OX) 400 (240 Mg) MG tablet Take 1 tablet (400 mg total) by mouth daily. 07/14/23   Artis Delay, MD  ALPRAZolam Prudy Feeler) 0.25 MG tablet Take 1 tablet (0.25 mg total) by mouth 2 (two) times daily as needed for anxiety or sleep. 05/25/23   Artis Delay, MD  carboxymethylcellulose (REFRESH PLUS) 0.5 % SOLN Place 1 drop into both eyes 2 (two) times daily as needed (dry eyes).    [provider]  Cholecalciferol (VITAMIN D3) 25 MCG (1000 UT) CAPS Take 1,000 Units by mouth daily.     [provider]  dexamethasone (DECADRON) 4 MG tablet Take 2 tablets by mouth starting day before chemo. Then take 2 tablets daily for 2 days starting day after  chemo. Take with food. 07/11/23   Artis Delay, MD  diphenhydrAMINE (BENADRYL ALLERGY) 25 MG tablet Take by mouth. 05/03/17   [provider]  escitalopram (LEXAPRO) 10 MG tablet 1 po qd Oral 10/09/19   [provider]  esomeprazole (NEXIUM) 20 MG capsule Take 20 mg by mouth daily.     [provider]  estradiol (ESTRACE VAGINAL) 0.1 MG/GM vaginal cream Place 1 applicatorful vaginally 3 (three) times a week. 04/27/22   Artis Delay, MD  lidocaine-prilocaine (EMLA) cream Apply topically as needed. 04/14/23   Artis Delay, MD  lisinopril (ZESTRIL) 10 MG tablet Take 1 tablet (10 mg total) by mouth daily. 04/14/23   Artis Delay, MD  loratadine (CLARITIN) 10 MG tablet Take 10 mg by mouth daily as needed for allergies.    [provider]  Multiple Vitamins-Minerals (MULTI FOR HER PO) 1 poq    [provider]  mupirocin ointment (BACTROBAN) 2 % 1 application Externally Twice a day 01/23/22   [provider]  ondansetron (ZOFRAN) 8 MG tablet Take 1 tablet by mouth 2 times daily as needed. Start on the third day after chemotherapy. 09/13/21   Artis Delay, MD  ondansetron (ZOFRAN) 8 MG tablet Take 1 tablet (8 mg total) by mouth every 8 (eight) hours as needed for  nausea or vomiting. 07/11/23   Artis Delay, MD  oxyCODONE (OXY IR/ROXICODONE) 5 MG immediate release tablet Take 1 tablet (5 mg total) by mouth every 4 (four) hours as needed for severe pain (pain score 7-10). 07/10/23   Artis Delay, MD  pantoprazole (PROTONIX) 40 MG tablet 1 po qd on an empty stomach Oral 08/24/18   [provider]  polyethylene glycol (MIRALAX / GLYCOLAX) 17 g packet Take 17 g by mouth 2 (two) times daily.    [provider]  prochlorperazine (COMPAZINE) 10 MG tablet Take 1 tablet by mouth every 6 hours as needed (Nausea or vomiting). 04/14/23   Artis Delay, MD  senna (SENOKOT) 8.6 MG tablet Take 2 tablets by mouth 3 (three) times daily.    [provider]    Physical Exam: Vitals:   07/16/23 1100 07/16/23 1206 07/16/23 1345 07/16/23 1445  BP: 132/78 (!) 140/68 (!) 113/90 (!) 148/78  Pulse: 86 78 78 83  Resp: 18 18 16 16   Temp:    98.3 F (36.8 C)  TempSrc:    Oral  SpO2: 97% 100% 96% 98%  Weight:      Height:       Constitutional: NAD, calm but becomes tearful when discussing current situation as it relates to lack of response to oncological therapies.  Also some anxiety regarding upcoming surgical procedure. ENMT: Mucous membranes are extremely dry. Posterior pharynx clear of any exudate or lesions.Normal dentition.  Neck: normal, supple, no masses, no thyromegaly Respiratory: clear to auscultation bilaterally, no wheezing, no crackles. Normal respiratory effort. No accessory muscle use.  Cardiovascular: Regular rate and rhythm, no murmurs / rubs / gallops. No extremity edema. 2+ pedal pulses.   Abdomen: Somewhat diffusely distended and mostly soft.  Significant tenderness with even minimal palpation over the right lateral upper abdomen as well as mid abdomen and bilateral lower quadrants.  Bowel sounds are present. Musculoskeletal: no clubbing / cyanosis. No joint deformity upper and lower extremities. Good ROM, no contractures. Normal muscle  tone.  Skin: no rashes, lesions, ulcers. No induration Neurologic: CN 2-12 grossly intact. Sensation intact,  Strength 5/5 x all 4 extremities.  Psychiatric: Normal judgment and insight.  Alert and oriented x 3.  Variable mood noting at times she is anxious, tearful but at other times is hopeful     Data Reviewed:  Sodium 138, potassium 3.1, chloride 98, CO2 23, glucose 109, BUN 16, creatinine 0.69, anion gap 17, alkaline phosphatase 156, albumin 3.3, LFTs otherwise normal, total bilirubin 1.3  WBC 5300, hemoglobin 12.1, platelets 300,000.  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 mass She presented with severe abdominal pain progressive over about 7 days worse over the past 4 days and was found to have an obstructing mass Has been evaluated by general surgery and plans are to proceed with diverting colostomy N.p.o with gastric tube to low wall suction, IV fluids at 125 cc/h (patient very dehydrated based on clinical exam), scheduled Zofran pending EKG to check QTc IV Dilaudid every 2 hours as needed pain-if inadequate pain control should consider PCA Dilaudid with low-dose continuous infusion WOC RN consulted for preoperative teaching regarding ostomy appliance and expectations  Left ovarian epithelial cancer Followed by oncology/[facility] Appreciate their assistance Prior to admission plans were for palliative chemotherapy unfortunately this will will have to be delayed until after recovery from colostomy procedure  Acute hypokalemia Check magnesium and phosphorus levels Suspect secondary to GI losses Replace as needed-potassium has been added to maintenance fluids  Anion gap metabolic acidosis From GI losses Repeat labs in a.m.  Mild protein calorie malnutrition Albumin 3.3 Continue to monitor especially during n.p.o. status and in the postoperative setting Nutrition consultation preop  GERD IV Protonix  Anxiety disorder On as  needed Xanax at home therefore will provide as needed IV Ativan   Advance Care Planning:   Code Status: Full Code   VTE prophylaxis: Lovenox  Consults: General Surgery, oncology  Family Communication: Husband at bedside  Severity of Illness: The appropriate patient status for this patient is INPATIENT. Inpatient status is judged to be reasonable and necessary in order to provide the required intensity of service to ensure the patient's safety. The patient's presenting symptoms, physical exam findings, and initial radiographic and laboratory data in the context of their chronic comorbidities is felt to place them at high risk for further clinical deterioration. Furthermore, it is not anticipated that the patient will be medically stable for discharge from the hospital within 2 midnights of admission.   * I certify that at the point of admission it is my clinical judgment that the patient will require inpatient hospital care spanning beyond 2 midnights from the point of admission due to high intensity of service, high risk for further deterioration and high frequency of surveillance required.*  Author: Junious Silk, NP 07/16/2023 3:06 PM  For on call review [email or web address].

## 2023-07-16 NOTE — Consult Note (Signed)
 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 Andrey Farmer in 2020) who presents with crampy abdominal pain, distention, and no bowel movement for the last 6 days despite aggressive bowel regimen at the direction of her oncologist Dr. [name] which has included miralax, senna, magnesium oxide, and lactulose.  Reports decreased oral intake due to worsening distention.  Denies flatus.  Endorses worsening right upper quadrant pain.  Has had some constipation leading up to this for about 2 weeks.  She has been passing small amounts of orange, mucoid fluid but no real bowel movement for the last 6 days.  CT imaging today demonstrates diffuse colonic dilation with complete obstruction at the level of the distal sigmoid colon likely due to external compression from adjacent malignancy/peritoneal implant.  Possible additional obstructive effect on bilateral ureters.  Of note, she had a staging CT scan done on 2/14 that does not show significant colonic distention; at that time there was noted increasing size of malignant processes in the pelvis as well as increased ascites and new carcinomatosis.  Allergies  Allergen Reactions   Carboplatin Other (See Comments)    Chest tightness/burning, flushing.  See Progress Note from 12/29/2022.   Skin Adhesives [Cyanoacrylate] Hives   Shrimp [Shellfish Allergy] Itching   Sulfasalazine Nausea And Vomiting   Sulfonamide Derivatives Nausea Only   Telithromycin     Other Reaction(s): severe dizziness    Past Medical History:  Diagnosis Date   Anxiety state, unspecified    Diverticulosis    Esophageal reflux    Family history of breast cancer    Family history of lymphoma    Family history of stomach cancer     Family history of uterine cancer    Fibroid tumor    Headache    due to allergies    Hiatal hernia    Hyperlipidemia    IBS (irritable bowel syndrome)    Internal hemorrhoids    Nonspecific elevation of levels of transaminase or lactic acid dehydrogenase (LDH)    ovarian ca dx'd 11/2018   Renal stone 07/2013   Tubulovillous adenoma of colon    Varicose veins    superficail thrombophlebitis (left LE)    Past Surgical History:  Procedure Laterality Date   CATARACT EXTRACTION, BILATERAL     cystoscopy with instillation for cystitis  2001   DEBULKING N/A 02/12/2019   Procedure: DEBULKING OF TUMOR;  Surgeon: Adolphus Birchwood, MD;  Location: [facility];  Service: Gynecology;  Laterality: N/A;   DILATION AND CURETTAGE OF UTERUS     miscarriage   HYSTERECTOMY ABDOMINAL WITH SALPINGO-OOPHORECTOMY Bilateral 02/12/2019   Procedure: HYSTERECTOMY ABDOMINAL WITH SALPINGO-OOPHORECTOMY;  Surgeon: Adolphus Birchwood, MD;  Location: [facility];  Service: Gynecology;  Laterality: Bilateral;   IR IMAGING GUIDED PORT INSERTION  12/06/2018   IR IMAGING GUIDED PORT INSERTION  10/10/2019   IR PARACENTESIS  12/03/2018   IR PARACENTESIS  12/14/2018   IR REMOVAL TUN ACCESS W/ PORT W/O FL MOD SED  06/03/2019   LAPAROSCOPY     adhesions   LITHOTRIPSY  07/2013   x 2   MYOMECTOMY  1992   OMENTECTOMY N/A 02/12/2019   Procedure: OMENTECTOMY;  Surgeon: Adolphus Birchwood, MD;  Location: [facility];  Service: Gynecology;  Laterality: N/A;   PARACENTESIS  11/21/2018   abdominal , removed 3.1  Liters     Family History  Problem Relation Age of Onset   Lymphoma Father        dx 38s   Heart disease Father    Hypertension Father    Hypertension Mother    Hyperlipidemia Mother    Breast cancer Mother 26   Diabetes Maternal Grandmother    Uterine cancer Maternal Grandmother        dx 18s   Stomach cancer Paternal Grandfather    Breast cancer Other        grandmother's sisters, both dx 62s   Colon cancer Neg Hx     Social History    Socioeconomic History   Marital status: Married    Spouse name: Not on file   Number of children: 0   Years of education: Not on file   Highest education level: Not on file  Occupational History   Occupation: sales  Tobacco Use   Smoking status: Former    Current packs/day: 0.00    Types: Cigarettes    Start date: 05/23/1970    Quit date: 05/23/1980    Years since quitting: 43.1   Smokeless tobacco: Never   Tobacco comments:    quit 1984  Vaping Use   Vaping status: Never Used  Substance and Sexual Activity   Alcohol use: Yes    Alcohol/week: 0.0 standard drinks of alcohol    Comment: 2/day, 11-21-2018   no alcodholint he last month    Drug use: No   Sexual activity: Yes    Partners: Male  Other Topics Concern   Not on file  Social History Narrative   Not on file   Social Drivers of Health   Financial Resource Strain: Not on file  Food Insecurity: Not on file  Transportation Needs: Not on file  Physical Activity: Not on file  Stress: Not on file  Social Connections: Not on file    No current facility-administered medications on file prior to encounter.   Current Outpatient Medications on File Prior to Encounter  Medication Sig Dispense Refill   lactulose (CHRONULAC) 10 GM/15ML solution Take 15 mLs (10 g total) by mouth 2 (two) times daily. 473 mL 0   magnesium oxide (MAG-OX) 400 (240 Mg) MG tablet Take 1 tablet (400 mg total) by mouth daily. 30 tablet 1   ALPRAZolam (XANAX) 0.25 MG tablet Take 1 tablet (0.25 mg total) by mouth 2 (two) times daily as needed for anxiety or sleep. 60 tablet 0   carboxymethylcellulose (REFRESH PLUS) 0.5 % SOLN Place 1 drop into both eyes 2 (two) times daily as needed (dry eyes).     Cholecalciferol (VITAMIN D3) 25 MCG (1000 UT) CAPS Take 1,000 Units by mouth daily.      dexamethasone (DECADRON) 4 MG tablet Take 2 tablets by mouth starting day before chemo. Then take 2 tablets daily for 2 days starting day after chemo. Take with food. 30  tablet 1   diphenhydrAMINE (BENADRYL ALLERGY) 25 MG tablet Take by mouth.     escitalopram (LEXAPRO) 10 MG tablet 1 po qd Oral     esomeprazole (NEXIUM) 20 MG capsule Take 20 mg by mouth daily.      estradiol (ESTRACE VAGINAL) 0.1 MG/GM vaginal cream Place 1 applicatorful vaginally 3 (three) times a week. 42.5 g 12   lidocaine-prilocaine (EMLA) cream Apply topically as needed. 30 g 1   lisinopril (ZESTRIL) 10 MG tablet Take 1 tablet (10 mg total) by mouth daily.     loratadine (  CLARITIN) 10 MG tablet Take 10 mg by mouth daily as needed for allergies.     Multiple Vitamins-Minerals (MULTI FOR HER PO) 1 poq     mupirocin ointment (BACTROBAN) 2 % 1 application Externally Twice a day     ondansetron (ZOFRAN) 8 MG tablet Take 1 tablet by mouth 2 times daily as needed. Start on the third day after chemotherapy. 30 tablet 1   ondansetron (ZOFRAN) 8 MG tablet Take 1 tablet (8 mg total) by mouth every 8 (eight) hours as needed for nausea or vomiting. 30 tablet 1   oxyCODONE (OXY IR/ROXICODONE) 5 MG immediate release tablet Take 1 tablet (5 mg total) by mouth every 4 (four) hours as needed for severe pain (pain score 7-10). 30 tablet 0   pantoprazole (PROTONIX) 40 MG tablet 1 po qd on an empty stomach Oral     polyethylene glycol (MIRALAX / GLYCOLAX) 17 g packet Take 17 g by mouth 2 (two) times daily.     prochlorperazine (COMPAZINE) 10 MG tablet Take 1 tablet by mouth every 6 hours as needed (Nausea or vomiting). 30 tablet 1   senna (SENOKOT) 8.6 MG tablet Take 2 tablets by mouth 3 (three) times daily.      Review of Systems: a complete, 10pt review of systems was completed with pertinent positives and negatives as documented in the HPI  Physical Exam: Vitals:   07/16/23 1100 07/16/23 1206  BP: 132/78 (!) 140/68  Pulse: 86 78  Resp: 18 18  Temp:    SpO2: 97% 100%   Gen: A&Ox3, no distress  Eyes: lids and conjunctivae normal, no icterus. Pupils equally round and reactive to light.  Chest:  respiratory effort is normal.   Cardiovascular: RRR  Gastrointestinal: soft, distended, tender to palpation in the right hemiabdomen and left lower quadrant without peritoneal signs.  Well-healed midline laparotomy scar without hernia. Muscoloskeletal: no clubbing or cyanosis of the fingers.  Strength is symmetrical throughout.  Range of motion of bilateral upper and lower extremities normal without pain, crepitation or contracture. Neuro: cranial nerves grossly intact.  Sensation intact to light touch diffusely. Psych: appropriate mood and affect, normal insight/judgment intact  Skin: warm and dry      Latest Ref Rng & Units 07/16/2023   11:28 AM 06/22/2023    8:01 AM 05/25/2023   11:47 AM  CBC  WBC 4.0 - 10.5 K/uL 5.3  4.7  3.9   Hemoglobin 12.0 - 15.0 g/dL 16.1  09.6  04.5   Hematocrit 36.0 - 46.0 % 37.2  35.7  36.0   Platelets 150 - 400 K/uL 300  231  259        Latest Ref Rng & Units 07/16/2023   11:28 AM 06/22/2023    8:01 AM 05/25/2023   11:47 AM  CMP  Glucose 70 - 99 mg/dL 409  98  95   BUN 8 - 23 mg/dL 16  12  13    Creatinine 0.44 - 1.00 mg/dL 8.11  9.14  7.82   Sodium 135 - 145 mmol/L 138  139  141   Potassium 3.5 - 5.1 mmol/L 3.1  3.7  3.7   Chloride 98 - 111 mmol/L 98  105  107   CO2 22 - 32 mmol/L 23  29  28    Calcium 8.9 - 10.3 mg/dL 9.3  9.4  9.5   Total Protein 6.5 - 8.1 g/dL 6.7  6.2  6.5   Total Bilirubin 0.0 - 1.2 mg/dL 1.3  1.2  1.2   Alkaline Phos 38 - 126 U/L 156  93  74   AST 15 - 41 U/L 27  26  25    ALT 0 - 44 U/L 22  21  23      Lab Results  Component Value Date   INR 1.0 10/10/2019   INR 0.9 06/03/2019   INR 1.0 12/06/2018    Imaging: CT ABDOMEN PELVIS WO CONTRAST Result Date: 07/16/2023 CLINICAL DATA:  Generalized abdominal pain for 4 days. History of ovarian cancer. EXAM: CT ABDOMEN AND PELVIS WITHOUT CONTRAST TECHNIQUE: Multidetector CT imaging of the abdomen and pelvis was performed following the standard protocol without IV contrast. RADIATION  DOSE REDUCTION: This exam was performed according to the departmental dose-optimization program which includes automated exposure control, adjustment of the mA and/or kV according to patient size and/or use of iterative reconstruction technique. COMPARISON:  July 07, 2023 FINDINGS: Lower chest: No acute abnormality. Hepatobiliary: No focal liver abnormality is seen. No gallstones, gallbladder wall thickening, or biliary dilatation. Pancreas: Unremarkable. No pancreatic ductal dilatation or surrounding inflammatory changes. Spleen: Normal in size without focal abnormality. Adrenals/Urinary Tract: Adrenal glands appear normal. Nonobstructive left nephrolithiasis is noted. Mild bilateral hydroureteronephrosis is noted without obstructing calculus. This is concerning for distal ureteral obstruction. Urinary bladder is decompressed. Stomach/Bowel: Stomach and appendix are unremarkable. No significant small bowel dilatation is noted. However, there is severe colonic dilatation which appears to be due to distal sigmoid colon obstruction, most likely due to external compression from adjacent peritoneal implant or malignancy. Vascular/Lymphatic: Aortic atherosclerosis. No significant adenopathy is noted currently. Reproductive: Status post hysterectomy. 5.3 x 3.0 cm left adnexal cystic lesion is noted which is not significantly changed compared to prior exam. 11.5 x 9.1 cm complex cystic abnormality is noted in the pelvis with large peripheral solid components. This is concerning for cystic ovarian malignancy or metastatic disease or possibly loculated ascites and adjacent peritoneal carcinomatosis. Other: No definite hernia is noted. Musculoskeletal: No acute or significant osseous findings. IMPRESSION: Interval development of severe diffuse colonic dilatation with complete obstruction at the level of the distal sigmoid colon, most likely due to external compression from adjacent malignancy or peritoneal implant given  history of ovarian carcinoma. There is noted 11.5 x 9.1 cm complex cystic abnormality in the pelvis with large peripheral solid components which is concerning for cystic ovarian malignancy or metastatic disease, or possibly loculated ascites with adjacent peritoneal carcinomatosis. Nonobstructive left nephrolithiasis is noted. Mild bilateral hydroureteronephrosis is noted without obstructing calculus, concerning for distal ureteral obstruction secondary to neoplasm. Aortic Atherosclerosis (ICD10-I70.0). Electronically Signed   By: Lupita Raider M.D.   On: 07/16/2023 11:53   DG Abdomen 1 View Result Date: 07/16/2023 CLINICAL DATA:  Abdominal pain, constipation. EXAM: ABDOMEN - 1 VIEW COMPARISON:  June 07, 2018. FINDINGS: No definite small bowel dilatation is noted. Moderately dilated and air-filled colon is noted most consistent with ileus. Or clips are noted in the pelvis. IMPRESSION: Moderately dilated and air-filled colon is noted concerning for ileus. Electronically Signed   By: Lupita Raider M.D.   On: 07/16/2023 10:56     A/P: 68 year old Rivas with progressive metastatic ovarian cancer who has now developed a large bowel obstruction secondary to the same.  Recommend NG tube decompression and bowel rest, gentle IV fluid resuscitation, and she will need a palliative diverting colostomy.  Discussed that if surgery is not desired, anticipate progressive colonic distention with worsening pain, inability to eat, risk of perforation, sepsis, and ultimately demise.  Discussed that the goal of diverting colostomy would be to alleviate colonic distention, allow GI function to the point where she could resume eating and continue current treatment plans but that it would not improve the progression of her cancer or have any effect on the overall prognosis from that.   Recommend engagement with oncology (Dr. [name]) as well as gynecology oncology just for their opinion on whether any of her current disease  is resectable, and palliative care to help navigate what at this point is likely a poor prognosis.  Patient would like to get these opinions before she agrees to surgical intervention.    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/01/2022   Nasal sore 01/20/2022   Pancytopenia, acquired (HCC) 01/18/2022   Essential hypertension 01/17/2022   Leukopenia due to antineoplastic chemotherapy (HCC) 10/21/2021   Abdominal pain 09/28/2021   Pelvic floor dysfunction in female 04/05/2021   Leg cramps 11/02/2020   Bilateral bunions 09/08/2020   Mucositis due to antineoplastic therapy 08/04/2020   Anorexia 08/04/2020   Chemotherapy-induced fatigue 05/04/2020   Foot pain, bilateral 05/04/2020   Chemotherapy-induced nausea 03/09/2020   Vaginal dryness, menopausal 02/10/2020   Thrombocytopenia (HCC) 10/22/2019   Skin rash 10/14/2019   Foreign body reaction of the skin 07/02/2019   Genetic testing 04/25/2019   Hyperglycemia, drug-induced 04/01/2019   Elevated liver enzymes 04/01/2019   Family history of breast cancer    Family history of uterine cancer    Family history of lymphoma    Family history of stomach cancer    Preventive measure 03/05/2019   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   Anemia due to antineoplastic chemotherapy 01/17/2019   Peritoneal carcinomatosis (HCC) 11/30/2018   Goals of care, counseling/discussion 11/30/2018   Left ovarian epithelial cancer (HCC) 11/29/2018   Uterine leiomyoma 02/28/2017   Vitreous syneresis, bilateral 01/30/2017   Hyperopia with astigmatism and presbyopia, bilateral 01/30/2017   Dry eyes, bilateral 01/30/2017   Dermatochalasis of eyelids of both eyes 01/30/2017   Posterior capsular opacification of both eyes, obscuring vision 01/30/2017    FLATULENCE-GAS-BLOATING 05/29/2008   ABDOMINAL PAIN RIGHT UPPER QUADRANT 05/29/2008   ABNORMAL TRANSAMINASE-LFT'S 05/29/2008   Anxiety state 05/28/2008   GERD 05/28/2008   HIATAL HERNIA 05/28/2008   IBS 05/28/2008   Hiatal hernia 05/28/2008       Phylliss Blakes, MD [facility]  See AMION to contact appropriate on-call provider

## 2023-07-16 NOTE — ED Triage Notes (Signed)
 Pt been experiencing abdominal pain and cramping for 4 days. Primary did a CT scan on Thursday and pt is constipated and unable to move stool. Taking miralax and senacot 2x day but still no stool movement. Given oxy to help with pain. N/V only when trying to eat/drink.

## 2023-07-16 NOTE — Plan of Care (Signed)

## 2023-07-17 ENCOUNTER — Other Ambulatory Visit (HOSPITAL_COMMUNITY): Payer: Self-pay

## 2023-07-17 ENCOUNTER — Encounter: Payer: Self-pay | Admitting: Hematology and Oncology

## 2023-07-17 DIAGNOSIS — Z8542 Personal history of malignant neoplasm of other parts of uterus: Secondary | ICD-10-CM

## 2023-07-17 DIAGNOSIS — K56609 Unspecified intestinal obstruction, unspecified as to partial versus complete obstruction: Secondary | ICD-10-CM | POA: Diagnosis not present

## 2023-07-17 DIAGNOSIS — C786 Secondary malignant neoplasm of retroperitoneum and peritoneum: Secondary | ICD-10-CM

## 2023-07-17 DIAGNOSIS — E876 Hypokalemia: Secondary | ICD-10-CM | POA: Insufficient documentation

## 2023-07-17 LAB — CBC
HCT: 32.4 % — ABNORMAL LOW (ref 36.0–46.0)
Hemoglobin: 10.5 g/dL — ABNORMAL LOW (ref 12.0–15.0)
MCH: 31.6 pg (ref 26.0–34.0)
MCHC: 32.4 g/dL (ref 30.0–36.0)
MCV: 97.6 fL (ref 80.0–100.0)
Platelets: 260 10*3/uL (ref 150–400)
RBC: 3.32 MIL/uL — ABNORMAL LOW (ref 3.87–5.11)
RDW: 13.4 % (ref 11.5–15.5)
WBC: 5.7 10*3/uL (ref 4.0–10.5)
nRBC: 0 % (ref 0.0–0.2)

## 2023-07-17 LAB — COMPREHENSIVE METABOLIC PANEL
ALT: 18 U/L (ref 0–44)
AST: 21 U/L (ref 15–41)
Albumin: 2.8 g/dL — ABNORMAL LOW (ref 3.5–5.0)
Alkaline Phosphatase: 124 U/L (ref 38–126)
Anion gap: 9 (ref 5–15)
BUN: 14 mg/dL (ref 8–23)
CO2: 24 mmol/L (ref 22–32)
Calcium: 8.1 mg/dL — ABNORMAL LOW (ref 8.9–10.3)
Chloride: 106 mmol/L (ref 98–111)
Creatinine, Ser: 0.47 mg/dL (ref 0.44–1.00)
GFR, Estimated: >60 mL/min (ref >60)
Glucose, Bld: 152 mg/dL — ABNORMAL HIGH (ref 70–99)
Potassium: 3.6 mmol/L (ref 3.5–5.1)
Sodium: 139 mmol/L (ref 135–145)
Total Bilirubin: 0.4 mg/dL (ref 0.0–1.2)
Total Protein: 5.8 g/dL — ABNORMAL LOW (ref 6.5–8.1)

## 2023-07-17 LAB — HIV ANTIBODY (ROUTINE TESTING W REFLEX): HIV Screen 4th Generation wRfx: NONREACTIVE

## 2023-07-17 MED ORDER — KCL IN DEXTROSE-NACL 20-5-0.9 MEQ/L-%-% IV SOLN
INTRAVENOUS | Status: AC
  Filled 2023-07-17 (×3): qty 1000

## 2023-07-17 MED ORDER — LIDOCAINE 5 % EX PTCH
1.0000 | MEDICATED_PATCH | Freq: Once | CUTANEOUS | Status: AC
  Administered 2023-07-17: 1 via TRANSDERMAL
  Filled 2023-07-17: qty 1

## 2023-07-17 NOTE — Progress Notes (Signed)
 Husband is at the bedside requesting an update and concerned that he has no idea what is going on with wife's condition. I have updated the husband of the potential plan for diverting colostomy AE the mass. The patient has been intermittently confused and overwhelmed at this time being unable to give the husband very little information. MD notified. Medical MD currently at bedside. Patient's husband says that he will come to the hospital in the morning to be present for update on plan of care 07/18/2023.

## 2023-07-17 NOTE — Progress Notes (Signed)
 Initial Nutrition Assessment  DOCUMENTATION CODES:   Non-severe (moderate) malnutrition in context of acute illness/injury  INTERVENTION:   Monitor magnesium, potassium, and phosphorus for at least 3 days, MD to replete as needed, as pt is at risk for refeeding syndrome.  -Recommend TPN initiation if diet cannot be advanced in the next 24-48 hours.  -Will monitor for diet advancement  NUTRITION DIAGNOSIS:   Moderate Malnutrition related to acute illness, constipation as evidenced by mild fat depletion, energy intake < 75% for > 7 days.  GOAL:   Patient will meet greater than or equal to 90% of their needs  MONITOR:   Diet advancement  REASON FOR ASSESSMENT:   Consult Assessment of nutrition requirement/status  ASSESSMENT:   68 year old female with a history of metastatic left ovarian epithelial cancer with peritoneal carcinomatosis, GERD, IBS admitted with abdominal pain nausea and severe constipation x 1 week.    CT done in the ER shows severe diffuse colonic dilatation with complete obstruction at the level of the distal sigmoid colon due to compression from adjacent malignancy.  She has been seen by general surgery recommending a diverting colostomy  Patient in room, family at bedside.  Pt reports poor intakes, last having eaten 2 saltines on 2/21. Last substantial meal was on Super bowl Sunday (2/9) when she ate 2 shredded chicken sliders. Pt will be at refeeding risk once nutrition is initiated again. Denies any issues with swallowing or chewing. Complains of discomfort from NGT.  NGT set to suction. Per surgery note, plan is for colonic stent vs diverting loop colostomy given large bowel obstruction.   Per patient, UBW ~142 lbs. Last weighed ~144 lbs at visit with Dr. [name] as week ago.  Weight on bed scale: 150 lbs (with blankets on bed).  Medications: Zofran, D5 infusion  Labs reviewed.  NUTRITION - FOCUSED PHYSICAL EXAM:  Flowsheet Row Most Recent Value   Orbital Region Moderate depletion  Upper Arm Region Unable to assess  Thoracic and Lumbar Region Unable to assess  Buccal Region Mild depletion  Temple Region Mild depletion  Clavicle Bone Region Unable to assess  Clavicle and Acromion Bone Region Unable to assess  Scapular Bone Region Unable to assess  Dorsal Hand Unable to assess  Patellar Region Unable to assess  Anterior Thigh Region Unable to assess  Posterior Calf Region Unable to assess  Edema (RD Assessment) None  Hair Reviewed  Eyes Reviewed  Mouth Unable to assess  Skin Reviewed  Nails Unable to assess       Diet Order:   Diet Order             Diet NPO time specified Except for: Ice Chips  Diet effective now                   EDUCATION NEEDS:   No education needs have been identified at this time  Skin:  Skin Assessment: Reviewed RN Assessment  Last BM:  2/24 -type 7  Height:   Ht Readings from Last 1 Encounters:  07/16/23 5\' 2"  (1.575 m)    Weight:   Wt Readings from Last 1 Encounters:  07/16/23 65.3 kg    BMI:  Body mass index is 26.34 kg/m.  Estimated Nutritional Needs:   Kcal:  1900-2100  Protein:  85-100g  Fluid:  1.9L/day   Tilda Franco, MS, RD, LDN Inpatient Clinical Dietitian Contact via Secure chat

## 2023-07-17 NOTE — Progress Notes (Signed)
   07/17/23 0926  TOC Brief Assessment  Insurance and Status Reviewed  Patient has primary care physician Yes  Home environment has been reviewed home w/ spouse  Prior level of function: independent  Prior/Current Home Services No current home services  Social Drivers of Health Review SDOH reviewed no interventions necessary  Readmission risk has been reviewed Yes  Transition of care needs transition of care needs identified, TOC will continue to follow

## 2023-07-17 NOTE — Progress Notes (Signed)
 GYN Oncology Progress Note  Message sent with my contact information through AMION at 3:50 pm about Palliative Care consult.

## 2023-07-17 NOTE — Consult Note (Signed)
 Gynecologic Oncology Consultation  Stacy Rivas 68 y.o. female  CC:  Chief Complaint  Patient presents with   Abdominal Pain    HPI: Stacy Rivas is a 68 year old female who presented to the ER on 07/16/2023 with worsening right upper quadrant abdominal pain/cramping, constipation. She reported her last BM being 7 days ago with no regular flatus passage. She reported trying multiple laxatives with no results. Labs performed included CBC, Cmet with potassium at 3.1/albumin 3.3/alkphos 156. While in the ER on 07/17/2023, she underwent an abdominal xray with moderately dilated and air filled colon concerning for ileus. A CT scan of the AP without contrast was performed revealing:  -Interval development of severe diffuse colonic dilatation with complete obstruction at the level of the distal sigmoid colon, most likely due to external compression from adjacent malignancy or peritoneal implant given history of ovarian carcinoma. There is noted 11.5 x 9.1 cm complex cystic abnormality in the pelvis with large peripheral solid components which is concerning for cystic ovarian malignancy or metastatic disease, or possibly loculated ascites with adjacent peritoneal carcinomatosis. -Nonobstructive left nephrolithiasis is noted. Mild bilateral hydroureteronephrosis is noted without obstructing calculus, concerning for distal ureteral obstruction secondary to neoplasm. -Aortic Atherosclerosis  General surgery was consulted with the recommendation for NG tube decompression, bowel rest, IVF, and palliative diverting colostomy. She was also seen by Medical Oncologist, Dr. Nance Pear, who felt it was reasonable to proceed with palliative intent surgery for diversion.   Prior to this admission, she had a CT CAP with contrast on 07/07/2023 to follow up on treatment response for ovarian cancer with the scan resulting progressive/worsening disease. At her visit with Dr. [name] on 07/11/23, there was a discussion about  switching treatment. Her oncology history includes metastatic ovarian cancer with peritoneal carcinomatosis on palliative chemotherapy currently. After initially receiving 3 cycles of neoadjuvant carboplatin and paclitaxel, she underwent open TAH/BSO, omentectomy and radical tumor debulking by Dr. Andrey Farmer on 02/12/2019. 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   She then completed 3 additional cycles of adjuvant chemotherapy (carboplatin and paclitaxel), completed in November 2020. She had a negative CT scan in 04/2019. Due to new abdominal pain, she underwent a CT AP on 09/2019 revealing interval development of recurrent/metastatic disease. At that time, she was started on carboplatin and gemzar. She completed 3 cycles of carboplatin last on 11/28/2019 and 9 cycles of gemzar ending 05/18/2020. In January 2022, imaging showed complete response to therapy and she was started on niraparib (insurance would not cover bevacizumab).  In April 2023, niraparib was discontinued due to progressive disease. She was then started on carboplatin and doxil at that time. Avastin was added in starting 11/2021 and was continued until 08/16/2022. On 09/15/2022, she started carboplatin and taxol for a total of 6 cycles with then single agent taxol until 03/31/2023 with a change in treatment due to progression. Starting 04/26/2023, she started doxil with a total of 3 cycles, last on 06/22/2023.   Interval History: She denies nausea. Having some abdominal pain at this time. She reports coming to the hospital due to not having bowel movements. Good appetite prior to this but poor this past week. She was having abdominal bloating prior to NG tube. She has "not really" passing gas. Some mucus like stool today. She know she  is  on palliative treatment but she is not giving up. Sister and niece at bedside.   Review of Systems: See interval. Additional review negative.  Current Meds: Current inpatient and outpatient meds reviewed.  Allergy:  Allergies  Allergen Reactions   Carboplatin Other (See Comments)    Chest tightness/burning, flushing.  See Progress Note from 12/29/2022.   Skin Adhesives [Cyanoacrylate] Hives   Shrimp [Shellfish Allergy] Itching   Sulfasalazine Nausea And Vomiting   Sulfonamide Derivatives Nausea Only   Telithromycin     Other Reaction(s): severe dizziness    Social Hx:   Social History   Socioeconomic History   Marital status: Married    Spouse name: Not on file   Number of children: 0   Years of education: Not on file   Highest education level: Not on file  Occupational History   Occupation: sales  Tobacco Use   Smoking status: Former    Current packs/day: 0.00    Types: Cigarettes    Start date: 05/23/1970    Quit date: 05/23/1980    Years since quitting: 43.1   Smokeless tobacco: Never   Tobacco comments:    quit 1984  Vaping Use   Vaping status: Never Used  Substance and Sexual Activity   Alcohol use: Yes    Alcohol/week: 0.0 standard drinks of alcohol    Comment: 2/day, 11-21-2018   no alcodholint he last month    Drug use: No   Sexual activity: Yes    Partners: Male  Other Topics Concern   Not on file  Social History Narrative   Not on file   Social Drivers of Health   Financial Resource Strain: Not on file  Food Insecurity: No Food Insecurity (07/16/2023)   Hunger Vital Sign    Worried About Running Out of Food in the Last Year: Never true    Ran Out of Food in the Last Year: Never true  Transportation Needs: No Transportation Needs (07/16/2023)   PRAPARE - Administrator, Civil Service (Medical): No    Lack of Transportation (Non-Medical): No  Physical Activity: Not on file  Stress: Not on file  Social Connections: Moderately Isolated  (07/16/2023)   Social Connection and Isolation Panel [NHANES]    Frequency of Communication with Friends and Family: Once a week    Frequency of Social Gatherings with Friends and Family: Once a week    Attends Religious Services: More than 4 times per year    Active Member of [employer] or Organizations: No    Attends Banker Meetings: Never    Marital Status: Married  Catering manager Violence: Not At Risk (07/16/2023)   Humiliation, Afraid, Rape, and Kick questionnaire    Fear of Current or Ex-Partner: No    Emotionally Abused: No    Physically Abused: No    Sexually Abused: No    Past Surgical Hx:  Past Surgical History:  Procedure Laterality Date   CATARACT EXTRACTION, BILATERAL     cystoscopy with instillation for cystitis  2001   DEBULKING N/A 02/12/2019   Procedure: DEBULKING OF TUMOR;  Surgeon: Adolphus Birchwood, MD;  Location: [facility];  Service: Gynecology;  Laterality: N/A;   DILATION AND CURETTAGE OF UTERUS     miscarriage   HYSTERECTOMY ABDOMINAL WITH SALPINGO-OOPHORECTOMY Bilateral 02/12/2019   Procedure: HYSTERECTOMY ABDOMINAL WITH SALPINGO-OOPHORECTOMY;  Surgeon: Adolphus Birchwood, MD;  Location: [facility];  Service: Gynecology;  Laterality: Bilateral;   IR IMAGING GUIDED PORT INSERTION  12/06/2018   IR  IMAGING GUIDED PORT INSERTION  10/10/2019   IR PARACENTESIS  12/03/2018   IR PARACENTESIS  12/14/2018   IR REMOVAL TUN ACCESS W/ PORT W/O FL MOD SED  06/03/2019   LAPAROSCOPY     adhesions   LITHOTRIPSY  07/2013   x 2   MYOMECTOMY  1992   OMENTECTOMY N/A 02/12/2019   Procedure: OMENTECTOMY;  Surgeon: Adolphus Birchwood, MD;  Location: [facility];  Service: Gynecology;  Laterality: N/A;   PARACENTESIS  11/21/2018   abdominal , removed 3.1 Liters     Past Medical Hx:  Past Medical History:  Diagnosis Date   Anxiety state, unspecified    Diverticulosis    Esophageal reflux    Family history of breast cancer    Family history of lymphoma    Family history of stomach cancer    Family  history of uterine cancer    Fibroid tumor    Headache    due to allergies    Hiatal hernia    Hyperlipidemia    IBS (irritable bowel syndrome)    Internal hemorrhoids    Nonspecific elevation of levels of transaminase or lactic acid dehydrogenase (LDH)    ovarian ca dx'd 11/2018   Renal stone 07/2013   Tubulovillous adenoma of colon    Varicose veins    superficail thrombophlebitis (left LE)    Family Hx:  Family History  Problem Relation Age of Onset   Lymphoma Father        dx 45s   Heart disease Father    Hypertension Father    Hypertension Mother    Hyperlipidemia Mother    Breast cancer Mother 58   Diabetes Maternal Grandmother    Uterine cancer Maternal Grandmother        dx 64s   Stomach cancer Paternal Grandfather    Breast cancer Other        grandmother's sisters, both dx 80s   Colon cancer Neg Hx     Vitals:  Blood pressure 119/77, pulse 83, temperature 98.1 F (36.7 C), resp. rate 17, height 5\' 2"  (1.575 m), weight 144 lb (65.3 kg), SpO2 93%.  Physical Exam (performed by Dr. [name]):  Alert, oriented, in no acute distress.  Breathing unlabored. Absent bowel sounds, moderately distended but soft. NG tube in place to intermittent wall suction  Assessment/Plan: 68 year old female with progressive, recurrent ovarian cancer currently admitted with evidence of colonic obstruction likely due to external compression from adjacent malignancy or peritoneal implant. Dr. [name] discussed that radical dissection of the malignant disease/mass would not improve outcome, has major risks, and would delay treatment if this is presented as an option from Dr. [name]. She has met with General Surgery with discussion of palliative diversion. After discussion today with Dr. Carolynne Edouard and the patient, GI to be contacted to see if stenting would be an option in this situation. Scenarios of no surgery/intervention vs palliative diversion discussed with the patient by Dr. [name]. Patient  would also like Palliative Care to see her to provide additional counseling/input. Continue with current plan of care. Await GI input. Palliative care consult placed.      Doylene Bode, NP 07/17/2023, 11:40 AM

## 2023-07-17 NOTE — Consult Note (Signed)
 WOC Nurse requested for preoperative stoma site marking  Discussed surgical procedure and stoma creation with patient.  Explained role of the WOC nurse team. Provided the patient with educational booklet and provided samples of pouching options.  Answered patient's questions.   Examined patient lying and sitting up in bed in order to place the marking in the patient's visual field, away from any creases or abdominal contour issues and within the rectus muscle. Attempted to mark below the patient's belt line.   Marked for colostomy in the LLQ  __7__ cm to the left of the umbilicus and __4__cm below the umbilicus.  Marked for ileostomy in the RLQ  _5___cm to the right of the umbilicus and  _4___ cm below the umbilicus.  Patient's abdomen cleansed with CHG wipes at site markings, allowed to air dry prior to marking. Pt plans for possible surgery today or tomorrow.  WOC Nurse team will follow up with patient after surgery for continued ostomy care and teaching if she receives an ostomy.  Thank-you,  Cammie Mcgee MSN, RN, CWOCN, [place], CNS [phone]

## 2023-07-17 NOTE — Plan of Care (Signed)

## 2023-07-17 NOTE — Progress Notes (Signed)
 Progress Note     Subjective: Patient reports some small clear rectal discharge. Having bilious output from NGT. Patient's niece at bedside as well and is an NP with [employer]. Discussion around possible surgery and making sure we feel that is her best option moving forward. She is understandably tearful but hopeful that she may have some improvement.    Objective: Vital signs in last 24 hours: Temp:  [98.1 F (36.7 C)-98.8 F (37.1 C)] 98.1 F (36.7 C) (02/24 0502) Pulse Rate:  [78-93] 83 (02/24 0502) Resp:  [16-18] 17 (02/24 0502) BP: (113-148)/(68-90) 119/77 (02/24 0502) SpO2:  [89 %-100 %] 93 % (02/24 0502) Last BM Date : 07/06/23  Intake/Output from previous day: 02/23 0701 - 02/24 0700 In: 1469.9 [P.O.:50; I.V.:1349.9; NG/GT:70] Out: -  Intake/Output this shift: No intake/output data recorded.  PE: General: pleasant, WD, WN female who is laying in bed in NAD Lungs:  Respiratory effort nonlabored Abd: soft, mild distention, NGT with thin bilious drainage, mild generalized ttp without peritonitis, midline surgical scar well healed Psych: A&Ox3 with an appropriate affect.    Lab Results:  Recent Labs    07/16/23 1128 07/17/23 0411  WBC 5.3 5.7  HGB 12.1 10.5*  HCT 37.2 32.4*  PLT 300 260   BMET Recent Labs    07/16/23 1128 07/17/23 0411  NA 138 139  K 3.1* 3.6  CL 98 106  CO2 23 24  GLUCOSE 109* 152*  BUN 16 14  CREATININE 0.69 0.47  CALCIUM 9.3 8.1*   PT/INR No results for input(s): "LABPROT", "INR" in the last 72 hours. CMP     Component Value Date/Time   NA 139 07/17/2023 0411   K 3.6 07/17/2023 0411   CL 106 07/17/2023 0411   CO2 24 07/17/2023 0411   GLUCOSE 152 (H) 07/17/2023 0411   BUN 14 07/17/2023 0411   CREATININE 0.47 07/17/2023 0411   CREATININE 0.55 06/22/2023 0801   CALCIUM 8.1 (L) 07/17/2023 0411   PROT 5.8 (L) 07/17/2023 0411   ALBUMIN 2.8 (L) 07/17/2023 0411   AST 21 07/17/2023 0411   AST 26 06/22/2023 0801   ALT  18 07/17/2023 0411   ALT 21 06/22/2023 0801   ALKPHOS 124 07/17/2023 0411   BILITOT 0.4 07/17/2023 0411   BILITOT 1.2 06/22/2023 0801   GFRNONAA >60 07/17/2023 0411   GFRNONAA >60 06/22/2023 0801   GFRAA >60 02/24/2020 0852   Lipase  No results found for: "LIPASE"     Studies/Results: DG Abd 1 View Result Date: 07/16/2023 CLINICAL DATA:  Abdominal pain EXAM: ABDOMEN - 1 VIEW COMPARISON:  Abdominal radiographs 07/16/2023 FINDINGS: NG tube in stomach.  Side port below the GE junction. Mild improvement in gaseous distention of colon. For example transverse colon measures 5.7 cm compared to 6.1. Cecum remains distended. No dilated loops of small bowel. IMPRESSION: 1. NG tube in stomach. 2. Some improvement in gaseous distention of the colon. Electronically Signed   By: Genevive Bi M.D.   On: 07/16/2023 17:48   CT ABDOMEN PELVIS WO CONTRAST Result Date: 07/16/2023 CLINICAL DATA:  Generalized abdominal pain for 4 days. History of ovarian cancer. EXAM: CT ABDOMEN AND PELVIS WITHOUT CONTRAST TECHNIQUE: Multidetector CT imaging of the abdomen and pelvis was performed following the standard protocol without IV contrast. RADIATION DOSE REDUCTION: This exam was performed according to the departmental dose-optimization program which includes automated exposure control, adjustment of the mA and/or kV according to patient size and/or use of iterative reconstruction technique.  COMPARISON:  July 07, 2023 FINDINGS: Lower chest: No acute abnormality. Hepatobiliary: No focal liver abnormality is seen. No gallstones, gallbladder wall thickening, or biliary dilatation. Pancreas: Unremarkable. No pancreatic ductal dilatation or surrounding inflammatory changes. Spleen: Normal in size without focal abnormality. Adrenals/Urinary Tract: Adrenal glands appear normal. Nonobstructive left nephrolithiasis is noted. Mild bilateral hydroureteronephrosis is noted without obstructing calculus. This is concerning for  distal ureteral obstruction. Urinary bladder is decompressed. Stomach/Bowel: Stomach and appendix are unremarkable. No significant small bowel dilatation is noted. However, there is severe colonic dilatation which appears to be due to distal sigmoid colon obstruction, most likely due to external compression from adjacent peritoneal implant or malignancy. Vascular/Lymphatic: Aortic atherosclerosis. No significant adenopathy is noted currently. Reproductive: Status post hysterectomy. 5.3 x 3.0 cm left adnexal cystic lesion is noted which is not significantly changed compared to prior exam. 11.5 x 9.1 cm complex cystic abnormality is noted in the pelvis with large peripheral solid components. This is concerning for cystic ovarian malignancy or metastatic disease or possibly loculated ascites and adjacent peritoneal carcinomatosis. Other: No definite hernia is noted. Musculoskeletal: No acute or significant osseous findings. IMPRESSION: Interval development of severe diffuse colonic dilatation with complete obstruction at the level of the distal sigmoid colon, most likely due to external compression from adjacent malignancy or peritoneal implant given history of ovarian carcinoma. There is noted 11.5 x 9.1 cm complex cystic abnormality in the pelvis with large peripheral solid components which is concerning for cystic ovarian malignancy or metastatic disease, or possibly loculated ascites with adjacent peritoneal carcinomatosis. Nonobstructive left nephrolithiasis is noted. Mild bilateral hydroureteronephrosis is noted without obstructing calculus, concerning for distal ureteral obstruction secondary to neoplasm. Aortic Atherosclerosis (ICD10-I70.0). Electronically Signed   By: Lupita Raider M.D.   On: 07/16/2023 11:53   DG Abdomen 1 View Result Date: 07/16/2023 CLINICAL DATA:  Abdominal pain, constipation. EXAM: ABDOMEN - 1 VIEW COMPARISON:  June 07, 2018. FINDINGS: No definite small bowel dilatation is noted.  Moderately dilated and air-filled colon is noted most consistent with ileus. Or clips are noted in the pelvis. IMPRESSION: Moderately dilated and air-filled colon is noted concerning for ileus. Electronically Signed   By: Lupita Raider M.D.   On: 07/16/2023 10:56    Anti-infectives: Anti-infectives (From admission, onward)    None        Assessment/Plan  Metastatic ovarian cancer on palliative chemotherapy Hx of  open total abdominal hysterectomy with bilateral salpingo-oophorectomy, omentectomy and radical tumor debulking by Dr. Andrey Farmer in 2020  Malignant large bowel obstruction  - CT 2/23 with increasing size of malignant process with increased ascites and new carcinomatosis, obstruction at the level of distal sigmoid colon secondary to external compression  - thin bilious output from NGT, still requiring pain meds  - ONC saw and would offer further chemo if recovered well s/p diversion  - Would recommend GYN ONC  weigh in as well  - will discuss with Dr. Carolynne Edouard further - may not be a candidate for colonic stent given location of obstruction but that would certainly be less invasive if possible  - if stenting not possible and patient agreeable then would plan to proceed with diverting loop colostomy for relief of obstruction - WOC has seen and marked today  FEN: NPO, NGT to LIWS, IVF per [facility]  VTE: LMWH ID: no current abx  - per [facility] -  HLD GERD Anxiety   LOS: 1 day   I reviewed Consultant ONC notes, hospitalist notes, last 24  h vitals and pain scores, last 48 h intake and output, last 24 h labs and trends, and last 24 h imaging results.  This care required high  level of medical decision making.    Juliet Rude, PA-C [facility] 07/17/2023, 11:00 AM Please see Amion for pager number during day hours 7:00am-4:30pm

## 2023-07-17 NOTE — Progress Notes (Addendum)
 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  Discussed constipation has been going on for 1 week or more.  Has tried several laxatives and stool softeners without improvement.  CT done in the ER shows severe diffuse colonic dilatation with complete obstruction at the level of the distal sigmoid colon due to compression from adjacent malignancy.  Peritoneal implant measuring 11.5 x 9.1 cm.  She has been seen by general surgery recommending a diverting colostomy, Dr. Cherly Hensen from oncology has seen the patient.  She is followed by Dr. [name].  I have consulted GYN oncology Dr. [name].  Outpatient oncology records noted.  She has not been able to eat anything for the past week or more.  Her regular last bowel movement was 7 days prior to admission.  She complains of severe crampy abdominal pain on morphine and Dilaudid. She has had total abdominal hysterectomy and bilateral salpingo-oophorectomy omentectomy radical tumor debulking by Dr. Andrey Farmer in 2020. Assessment & Plan:   Principal Problem:   Large bowel obstruction (HCC) Active Problems:   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 [facility])    #1 colon obstruction secondary to malignant left ovarian mass.  She is admitted with intractable abdominal pain and severe constipation and nausea.  She was found to have complete obstruction at the level of the distal sigmoid colon due to compression from left ovarian malignancy.  She has been kept n.p.o. on IV fluids and NG tube was placed yesterday with bilious drainage noted.  Surgery oncology following.  Plan for colostomy by surgery noted.  GYN oncology consulted.  Continue supportive measures with NG tube, pain control with Dilaudid plus or minus  morphine, Zofran and Phenergan etc.  Continue IV fluids.  #2 metastatic ovarian cancer followed by Dr. Emeline Darling such on palliative chemotherapy currently on hold, to be continued after recovery from colostomy procedures.  #3 hypokalemia resolved with repletion, mag level was 1.9.   #4 anemia of chronic disease hemoglobin 10.5 from 12.4 and partly due to hemodilution with ongoing IV hydration no evidence of active bleeding noted.  #5 GERD continue Protonix  #6 anxiety takes Xanax at home  Ativan since she is n.p.o. during hospitalization  Estimated body mass index is 26.34 kg/m as calculated from the following:   Height as of this encounter: 5\' 2"  (1.575 m).   Weight as of this encounter: 65.3 kg.  DVT prophylaxis: lovenox Code Status full Family Communication:none today Disposition Plan:  Status is: Inpatient Remains inpatient appropriate because: acute illnesss   Consultants: gyn Onc surgery  Procedures: ngt 2/23  Antimicrobials: None  Subjective: Resting in bed NG tube in place    Objective: Vitals:   07/16/23 1700 07/16/23 1944 07/17/23 0202 07/17/23 0502  BP:  131/75 134/77 119/77  Pulse:  81 82 83  Resp:  17 17 17   Temp:  98.8 F (37.1 C) 98.1 F (36.7 C) 98.1 F (36.7 C)  TempSrc:      SpO2: 98% (!) 89% 92% 93%  Weight:      Height:        Intake/Output Summary (Last 24 hours) at 07/17/2023 1115 Last data filed at 07/17/2023 0447 Gross per 24 hour  Intake 1469.91 ml  Output --  Net 1469.91 ml   Filed Weights   07/16/23 1001  Weight: 65.3 kg    Examination:  General exam: Appears in no acute distress Respiratory system: Clear to auscultation. Respiratory effort normal. Cardiovascular system: S1 & S2 heard, RRR. No JVD, murmurs, rubs, gallops or clicks. No pedal edema. Gastrointestinal system: Abdomen is distended, soft and tender. No organomegaly or masses felt. Normal bowel sounds heard. Central nervous system: Alert and oriented. No focal  neurological deficits. Extremities: No edema    Data Reviewed: I have personally reviewed following labs and imaging studies  CBC: Recent Labs  Lab 07/16/23 1128 07/17/23 0411  WBC 5.3 5.7  NEUTROABS 3.5  --   HGB 12.1 10.5*  HCT 37.2 32.4*  MCV 96.6 97.6  PLT 300 260   Basic Metabolic Panel: Recent Labs  Lab 07/16/23 1128 07/17/23 0411  NA 138 139  K 3.1* 3.6  CL 98 106  CO2 23 24  GLUCOSE 109* 152*  BUN 16 14  CREATININE 0.69 0.47  CALCIUM 9.3 8.1*  MG 1.9  --   PHOS 4.1  --    GFR: Estimated Creatinine Clearance: 58.9 mL/min (by C-G formula based on SCr of 0.47 mg/dL). Liver Function Tests: Recent Labs  Lab 07/16/23 1128 07/17/23 0411  AST 27 21  ALT 22 18  ALKPHOS 156* 124  BILITOT 1.3* 0.4  PROT 6.7 5.8*  ALBUMIN 3.3* 2.8*   No results for input(s): "LIPASE", "AMYLASE" in the last 168 hours. No results for input(s): "AMMONIA" in the last 168 hours. Coagulation Profile: No results for input(s): "INR", "PROTIME" in the last 168 hours. Cardiac Enzymes: No results for input(s): "CKTOTAL", "CKMB", "CKMBINDEX", "TROPONINI" in the last 168 hours. BNP (last 3 results) No results for input(s): "PROBNP" in the last 8760 hours. HbA1C: No results for input(s): "HGBA1C" in the last 72 hours. CBG: No results for input(s): "GLUCAP" in the last 168 hours. Lipid Profile: No results for input(s): "CHOL", "HDL", "LDLCALC", "TRIG", "CHOLHDL", "LDLDIRECT" in the last 72 hours. Thyroid Function Tests: No results for input(s): "TSH", "T4TOTAL", "FREET4", "T3FREE", "THYROIDAB" in the last 72 hours. Anemia Panel: No results for input(s): "VITAMINB12", "FOLATE", "FERRITIN", "TIBC", "IRON", "RETICCTPCT" in the last 72 hours. Sepsis Labs: No results for input(s): "PROCALCITON", "LATICACIDVEN" in the last 168 hours.  No results found for this or any previous visit (from the past 240 hours).       Radiology Studies: DG Abd 1 View Result Date: 07/16/2023 CLINICAL  DATA:  Abdominal pain EXAM: ABDOMEN - 1 VIEW COMPARISON:  Abdominal radiographs 07/16/2023 FINDINGS: NG tube in stomach.  Side port below the GE junction. Mild improvement in gaseous distention of colon. For example transverse colon measures 5.7 cm compared to 6.1. Cecum remains distended. No dilated loops of small bowel. IMPRESSION: 1. NG tube in stomach. 2. Some improvement in gaseous distention of the colon. Electronically Signed   By: Genevive Bi M.D.   On: 07/16/2023 17:48   CT ABDOMEN PELVIS WO CONTRAST Result Date: 07/16/2023 CLINICAL DATA:  Generalized abdominal pain for 4 days. History of ovarian cancer. EXAM: CT ABDOMEN AND PELVIS WITHOUT CONTRAST TECHNIQUE: Multidetector CT imaging of the abdomen and pelvis was performed following the standard protocol without IV contrast. RADIATION DOSE REDUCTION: This exam was performed according to the departmental dose-optimization program which includes automated exposure control, adjustment of the mA and/or kV according to patient size and/or use of iterative reconstruction technique. COMPARISON:  July 07, 2023 FINDINGS: Lower chest: No acute abnormality. Hepatobiliary: No focal liver abnormality is seen. No gallstones, gallbladder wall thickening, or  biliary dilatation. Pancreas: Unremarkable. No pancreatic ductal dilatation or surrounding inflammatory changes. Spleen: Normal in size without focal abnormality. Adrenals/Urinary Tract: Adrenal glands appear normal. Nonobstructive left nephrolithiasis is noted. Mild bilateral hydroureteronephrosis is noted without obstructing calculus. This is concerning for distal ureteral obstruction. Urinary bladder is decompressed. Stomach/Bowel: Stomach and appendix are unremarkable. No significant small bowel dilatation is noted. However, there is severe colonic dilatation which appears to be due to distal sigmoid colon obstruction, most likely due to external compression from adjacent peritoneal implant or  malignancy. Vascular/Lymphatic: Aortic atherosclerosis. No significant adenopathy is noted currently. Reproductive: Status post hysterectomy. 5.3 x 3.0 cm left adnexal cystic lesion is noted which is not significantly changed compared to prior exam. 11.5 x 9.1 cm complex cystic abnormality is noted in the pelvis with large peripheral solid components. This is concerning for cystic ovarian malignancy or metastatic disease or possibly loculated ascites and adjacent peritoneal carcinomatosis. Other: No definite hernia is noted. Musculoskeletal: No acute or significant osseous findings. IMPRESSION: Interval development of severe diffuse colonic dilatation with complete obstruction at the level of the distal sigmoid colon, most likely due to external compression from adjacent malignancy or peritoneal implant given history of ovarian carcinoma. There is noted 11.5 x 9.1 cm complex cystic abnormality in the pelvis with large peripheral solid components which is concerning for cystic ovarian malignancy or metastatic disease, or possibly loculated ascites with adjacent peritoneal carcinomatosis. Nonobstructive left nephrolithiasis is noted. Mild bilateral hydroureteronephrosis is noted without obstructing calculus, concerning for distal ureteral obstruction secondary to neoplasm. Aortic Atherosclerosis (ICD10-I70.0). Electronically Signed   By: Lupita Raider M.D.   On: 07/16/2023 11:53   DG Abdomen 1 View Result Date: 07/16/2023 CLINICAL DATA:  Abdominal pain, constipation. EXAM: ABDOMEN - 1 VIEW COMPARISON:  June 07, 2018. FINDINGS: No definite small bowel dilatation is noted. Moderately dilated and air-filled colon is noted most consistent with ileus. Or clips are noted in the pelvis. IMPRESSION: Moderately dilated and air-filled colon is noted concerning for ileus. Electronically Signed   By: Lupita Raider M.D.   On: 07/16/2023 10:56        Scheduled Meds:  enoxaparin (LOVENOX) injection  40 mg  Subcutaneous Q24H   lidocaine  1 patch Transdermal Once   ondansetron  4 mg Oral Q6H   Or   ondansetron (ZOFRAN) IV  4 mg Intravenous Q6H   pantoprazole (PROTONIX) IV  40 mg Intravenous Q24H   sodium chloride flush  3 mL Intravenous Q12H   Continuous Infusions:  dextrose 5 % and 0.9 % NaCl with KCl 20 mEq/L 125 mL/hr at 07/17/23 9147     LOS: 1 day   Alwyn Ren, MD 07/17/2023, 11:15 AM

## 2023-07-18 ENCOUNTER — Other Ambulatory Visit: Payer: Self-pay | Admitting: Hematology and Oncology

## 2023-07-18 DIAGNOSIS — K56609 Unspecified intestinal obstruction, unspecified as to partial versus complete obstruction: Secondary | ICD-10-CM | POA: Diagnosis not present

## 2023-07-18 LAB — CBC
HCT: 31 % — ABNORMAL LOW (ref 36.0–46.0)
Hemoglobin: 10 g/dL — ABNORMAL LOW (ref 12.0–15.0)
MCH: 31.7 pg (ref 26.0–34.0)
MCHC: 32.3 g/dL (ref 30.0–36.0)
MCV: 98.4 fL (ref 80.0–100.0)
Platelets: 262 10*3/uL (ref 150–400)
RBC: 3.15 MIL/uL — ABNORMAL LOW (ref 3.87–5.11)
RDW: 13.8 % (ref 11.5–15.5)
WBC: 5.1 10*3/uL (ref 4.0–10.5)
nRBC: 0 % (ref 0.0–0.2)

## 2023-07-18 LAB — COMPREHENSIVE METABOLIC PANEL
ALT: 16 U/L (ref 0–44)
AST: 20 U/L (ref 15–41)
Albumin: 2.7 g/dL — ABNORMAL LOW (ref 3.5–5.0)
Alkaline Phosphatase: 111 U/L (ref 38–126)
Anion gap: 9 (ref 5–15)
BUN: 8 mg/dL (ref 8–23)
CO2: 23 mmol/L (ref 22–32)
Calcium: 8.2 mg/dL — ABNORMAL LOW (ref 8.9–10.3)
Chloride: 113 mmol/L — ABNORMAL HIGH (ref 98–111)
Creatinine, Ser: 0.47 mg/dL (ref 0.44–1.00)
GFR, Estimated: >60 mL/min (ref >60)
Glucose, Bld: 129 mg/dL — ABNORMAL HIGH (ref 70–99)
Potassium: 3.4 mmol/L — ABNORMAL LOW (ref 3.5–5.1)
Sodium: 145 mmol/L (ref 135–145)
Total Bilirubin: 0.6 mg/dL (ref 0.0–1.2)
Total Protein: 5.9 g/dL — ABNORMAL LOW (ref 6.5–8.1)

## 2023-07-18 LAB — MAGNESIUM: Magnesium: 1.8 mg/dL (ref 1.7–2.4)

## 2023-07-18 MED ORDER — KCL IN DEXTROSE-NACL 40-5-0.9 MEQ/L-%-% IV SOLN
INTRAVENOUS | Status: AC
  Filled 2023-07-18 (×3): qty 1000

## 2023-07-18 MED ORDER — KCL IN DEXTROSE-NACL 20-5-0.9 MEQ/L-%-% IV SOLN
INTRAVENOUS | Status: AC
  Filled 2023-07-18 (×2): qty 1000

## 2023-07-18 NOTE — Plan of Care (Signed)

## 2023-07-18 NOTE — Progress Notes (Signed)
 Mobility Specialist - Progress Note   07/18/23 1149  Mobility  Activity Ambulated with assistance in hallway  Level of Assistance Standby assist, set-up cues, supervision of patient - no hands on  Assistive Device Front wheel walker  Distance Ambulated (ft) 120 ft  Activity Response Tolerated well  Mobility Referral Yes  Mobility visit 1 Mobility  Mobility Specialist Start Time (ACUTE ONLY) 1133  Mobility Specialist Stop Time (ACUTE ONLY) 1148  Mobility Specialist Time Calculation (min) (ACUTE ONLY) 15 min   Pt received in bed and agreeable to mobility. Pt had x2 LOB that were self corrected. No complaints during session. Upon returning to room, pt requested assistance to the bathroom. Pt to bed after session with all needs met.    [facility]

## 2023-07-18 NOTE — Progress Notes (Signed)
 GYN Oncology Brief Progress Note  Patient reports being stable this am. Has "some" abdominal pain that is managed with medications. No nausea reported. No stool or flatus reported. Would still like to talk with Palliative and Dr. [name]. Spoke with General Surgery and GI stating stenting is not an option. Await Palliative consult for further discussion with patient. Continue with current plan of care. Radical debulking of recurrent disease not recommended. We will defer to General Surgery and Oncology at this time but we are happy to assist with any needs.

## 2023-07-18 NOTE — Progress Notes (Signed)
 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  Discussed constipation has been going on for 1 week or more.  Has tried several laxatives and stool softeners without improvement.  CT done in the ER shows severe diffuse colonic dilatation with complete obstruction at the level of the distal sigmoid colon due to compression from adjacent malignancy.  Peritoneal implant measuring 11.5 x 9.1 cm.  She has been seen by general surgery recommending a diverting colostomy, Dr. Cherly Hensen from oncology has seen the patient.  She is followed by Dr. [name].  I have consulted GYN oncology Dr. [name].  Outpatient oncology records noted.  She has not been able to eat anything for the past week or more.  Her regular last bowel movement was 7 days prior to admission.  She complains of severe crampy abdominal pain on morphine and Dilaudid. She has had total abdominal hysterectomy and bilateral salpingo-oophorectomy omentectomy radical tumor debulking by Dr. Andrey Farmer in 2020. Assessment & Plan:   Principal Problem:   Large bowel obstruction (HCC) Active Problems:   Anxiety state   GERD   Left ovarian epithelial cancer (HCC)   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 (HCC)   Hypokalemia    #1:  Bowel obstruction secondary to malignant left ovarian mass.  She is admitted with intractable abdominal pain and severe constipation and nausea.  She was found to have complete obstruction at the level of the distal sigmoid colon due to compression from left ovarian malignancy.  She has been kept n.p.o. on IV fluids and NG tube was placed with bilious drainage noted.  General Surgery, GYN oncology and oncology following GI consulted as GYN oncology wanted to check with GI for  possible GI stent as an alternative management. GYN oncology does not think debulking of the pelvic tumor would not impact her survival Surgery opting for diverting colostomy Oncology considering on palliative chemo if she does well with the surgery.  Continue supportive measures with NG tube, pain control with Dilaudid plus or minus morphine, Zofran and Phenergan etc.  Continue IV fluids.  #2 metastatic ovarian cancer followed by Dr. [name] on palliative chemotherapy currently on hold, to be continued after recovery from colostomy procedures.  #3 hypokalemia potassium continue IV repletion mag 1.9  #4 anemia of chronic disease hemoglobin 10.5 from 12.4 and partly due to hemodilution with ongoing IV hydration no evidence of active bleeding noted.  #5 GERD continue Protonix  #6 anxiety takes Xanax at home  Ativan since she is n.p.o. during hospitalization  #7 moderate malnutrition due to acute illness  Estimated body mass index is 26.34 kg/m as calculated from the following:   Height as of this encounter: 5\' 2"  (1.575 m).   Weight as of this encounter: 65.3 kg.  DVT prophylaxis: lovenox Code Status full Family Communication:none today Disposition Plan:  Status is: Inpatient Remains inpatient appropriate because: acute illnesss   Consultants: gyn Onc surgery  Procedures: ngt 2/23  Antimicrobials: None  Subjective: Sitting up by the side of bed just went to rest room  Anxious as to what will happen today  Objective: Vitals:   07/17/23 1226 07/17/23 2021 07/18/23 0452 07/18/23 1247  BP: 133/83 (!) 147/89 130/84 (!) 163/73  Pulse: 77 85 76 82  Resp: 16 16 15 16   Temp: 98.3  F (36.8 C) 98 F (36.7 C) 98.2 F (36.8 C) (!) 97.5 F (36.4 C)  TempSrc:  Oral Oral Oral  SpO2: 90% 95% 94% 97%  Weight:      Height:        Intake/Output Summary (Last 24 hours) at 07/18/2023 1254 Last data filed at 07/18/2023 8295 Gross per 24 hour  Intake 90 ml  Output 100 ml  Net -10  ml   Filed Weights   07/16/23 1001  Weight: 65.3 kg    Examination:  General exam: Appears in no acute distress Respiratory system: Clear to auscultation. Respiratory effort normal. Cardiovascular system: S1 & S2 heard, RRR. Gastrointestinal system: Abdomen is distended, soft and tender. Central nervous system: Alert and oriented. No focal neurological deficits. Extremities: No edema    Data Reviewed: I have personally reviewed following labs and imaging studies  CBC: Recent Labs  Lab 07/16/23 1128 07/17/23 0411 07/18/23 1015  WBC 5.3 5.7 5.1  NEUTROABS 3.5  --   --   HGB 12.1 10.5* 10.0*  HCT 37.2 32.4* 31.0*  MCV 96.6 97.6 98.4  PLT 300 260 262   Basic Metabolic Panel: Recent Labs  Lab 07/16/23 1128 07/17/23 0411 07/18/23 1015  NA 138 139 145  K 3.1* 3.6 3.4*  CL 98 106 113*  CO2 23 24 23   GLUCOSE 109* 152* 129*  BUN 16 14 8   CREATININE 0.69 0.47 0.47  CALCIUM 9.3 8.1* 8.2*  MG 1.9  --   --   PHOS 4.1  --   --    GFR: Estimated Creatinine Clearance: 58.9 mL/min (by C-G formula based on SCr of 0.47 mg/dL). Liver Function Tests: Recent Labs  Lab 07/16/23 1128 07/17/23 0411 07/18/23 1015  AST 27 21 20   ALT 22 18 16   ALKPHOS 156* 124 111  BILITOT 1.3* 0.4 0.6  PROT 6.7 5.8* 5.9*  ALBUMIN 3.3* 2.8* 2.7*   No results for input(s): "LIPASE", "AMYLASE" in the last 168 hours. No results for input(s): "AMMONIA" in the last 168 hours. Coagulation Profile: No results for input(s): "INR", "PROTIME" in the last 168 hours. Cardiac Enzymes: No results for input(s): "CKTOTAL", "CKMB", "CKMBINDEX", "TROPONINI" in the last 168 hours. BNP (last 3 results) No results for input(s): "PROBNP" in the last 8760 hours. HbA1C: No results for input(s): "HGBA1C" in the last 72 hours. CBG: No results for input(s): "GLUCAP" in the last 168 hours. Lipid Profile: No results for input(s): "CHOL", "HDL", "LDLCALC", "TRIG", "CHOLHDL", "LDLDIRECT" in the last 72  hours. Thyroid Function Tests: No results for input(s): "TSH", "T4TOTAL", "FREET4", "T3FREE", "THYROIDAB" in the last 72 hours. Anemia Panel: No results for input(s): "VITAMINB12", "FOLATE", "FERRITIN", "TIBC", "IRON", "RETICCTPCT" in the last 72 hours. Sepsis Labs: No results for input(s): "PROCALCITON", "LATICACIDVEN" in the last 168 hours.  No results found for this or any previous visit (from the past 240 hours).       Radiology Studies: DG Abd 1 View Result Date: 07/16/2023 CLINICAL DATA:  Abdominal pain EXAM: ABDOMEN - 1 VIEW COMPARISON:  Abdominal radiographs 07/16/2023 FINDINGS: NG tube in stomach.  Side port below the GE junction. Mild improvement in gaseous distention of colon. For example transverse colon measures 5.7 cm compared to 6.1. Cecum remains distended. No dilated loops of small bowel. IMPRESSION: 1. NG tube in stomach. 2. Some improvement in gaseous distention of the colon. Electronically Signed   By: Genevive Bi M.D.   On: 07/16/2023 17:48        Scheduled Meds:  enoxaparin (LOVENOX) injection  40 mg Subcutaneous Q24H   ondansetron  4 mg Oral Q6H   Or   ondansetron (ZOFRAN) IV  4 mg Intravenous Q6H   pantoprazole (PROTONIX) IV  40 mg Intravenous Q24H   sodium chloride flush  3 mL Intravenous Q12H   Continuous Infusions:  dextrose 5 % and 0.9 % NaCl with KCl 20 mEq/L 100 mL/hr at 07/18/23 1203     LOS: 2 days   Alwyn Ren, MD 07/18/2023, 12:54 PM

## 2023-07-18 NOTE — Progress Notes (Incomplete)
Pt required a lot of emotional support throughout the night. Tearful when speaking of developments during prior shift.

## 2023-07-18 NOTE — Plan of Care (Signed)

## 2023-07-18 NOTE — Progress Notes (Signed)
 Progress Note     Subjective: Patient reports still having some mucus discharge per rectum but no stool or flatus. Requiring pain medication every 2-2.5 hrs. NGT functioning. Her husband and sister are at bedside this AM. We discussed that GI was consulted but does not recommend a stent in her case. Discussed pathways of diverting ostomy in hopes of getting to further palliative chemo once recovered from surgery vs comfort measures. Patient not ready to make that decision at this time and would like to discuss with palliative care and family.   Objective: Vital signs in last 24 hours: Temp:  [98 F (36.7 C)-98.3 F (36.8 C)] 98.2 F (36.8 C) (02/25 0452) Pulse Rate:  [76-85] 76 (02/25 0452) Resp:  [15-16] 15 (02/25 0452) BP: (130-147)/(83-89) 130/84 (02/25 0452) SpO2:  [90 %-95 %] 94 % (02/25 0452) Last BM Date : 07/06/23  Intake/Output from previous day: 02/24 0701 - 02/25 0700 In: 60 [P.O.:60] Out: 100 [Emesis/NG output:100] Intake/Output this shift: Total I/O In: 30 [P.O.:30] Out: -   PE: General: pleasant, WD, WN female who is laying in bed in NAD Lungs:  Respiratory effort nonlabored Abd: soft, mild distention, NGT with thin bilious drainage, mild generalized ttp without peritonitis, midline surgical scar well healed Psych: A&Ox3 with an appropriate affect.    Lab Results:  Recent Labs    07/16/23 1128 07/17/23 0411  WBC 5.3 5.7  HGB 12.1 10.5*  HCT 37.2 32.4*  PLT 300 260   BMET Recent Labs    07/16/23 1128 07/17/23 0411  NA 138 139  K 3.1* 3.6  CL 98 106  CO2 23 24  GLUCOSE 109* 152*  BUN 16 14  CREATININE 0.69 0.47  CALCIUM 9.3 8.1*   PT/INR No results for input(s): "LABPROT", "INR" in the last 72 hours. CMP     Component Value Date/Time   NA 139 07/17/2023 0411   K 3.6 07/17/2023 0411   CL 106 07/17/2023 0411   CO2 24 07/17/2023 0411   GLUCOSE 152 (H) 07/17/2023 0411   BUN 14 07/17/2023 0411   CREATININE 0.47 07/17/2023 0411    CREATININE 0.55 06/22/2023 0801   CALCIUM 8.1 (L) 07/17/2023 0411   PROT 5.8 (L) 07/17/2023 0411   ALBUMIN 2.8 (L) 07/17/2023 0411   AST 21 07/17/2023 0411   AST 26 06/22/2023 0801   ALT 18 07/17/2023 0411   ALT 21 06/22/2023 0801   ALKPHOS 124 07/17/2023 0411   BILITOT 0.4 07/17/2023 0411   BILITOT 1.2 06/22/2023 0801   GFRNONAA >60 07/17/2023 0411   GFRNONAA >60 06/22/2023 0801   GFRAA >60 02/24/2020 0852   Lipase  No results found for: "LIPASE"     Studies/Results: DG Abd 1 View Result Date: 07/16/2023 CLINICAL DATA:  Abdominal pain EXAM: ABDOMEN - 1 VIEW COMPARISON:  Abdominal radiographs 07/16/2023 FINDINGS: NG tube in stomach.  Side port below the GE junction. Mild improvement in gaseous distention of colon. For example transverse colon measures 5.7 cm compared to 6.1. Cecum remains distended. No dilated loops of small bowel. IMPRESSION: 1. NG tube in stomach. 2. Some improvement in gaseous distention of the colon. Electronically Signed   By: Genevive Bi M.D.   On: 07/16/2023 17:48   CT ABDOMEN PELVIS WO CONTRAST Result Date: 07/16/2023 CLINICAL DATA:  Generalized abdominal pain for 4 days. History of ovarian cancer. EXAM: CT ABDOMEN AND PELVIS WITHOUT CONTRAST TECHNIQUE: Multidetector CT imaging of the abdomen and pelvis was performed following the standard protocol without IV  contrast. RADIATION DOSE REDUCTION: This exam was performed according to the departmental dose-optimization program which includes automated exposure control, adjustment of the mA and/or kV according to patient size and/or use of iterative reconstruction technique. COMPARISON:  July 07, 2023 FINDINGS: Lower chest: No acute abnormality. Hepatobiliary: No focal liver abnormality is seen. No gallstones, gallbladder wall thickening, or biliary dilatation. Pancreas: Unremarkable. No pancreatic ductal dilatation or surrounding inflammatory changes. Spleen: Normal in size without focal abnormality.  Adrenals/Urinary Tract: Adrenal glands appear normal. Nonobstructive left nephrolithiasis is noted. Mild bilateral hydroureteronephrosis is noted without obstructing calculus. This is concerning for distal ureteral obstruction. Urinary bladder is decompressed. Stomach/Bowel: Stomach and appendix are unremarkable. No significant small bowel dilatation is noted. However, there is severe colonic dilatation which appears to be due to distal sigmoid colon obstruction, most likely due to external compression from adjacent peritoneal implant or malignancy. Vascular/Lymphatic: Aortic atherosclerosis. No significant adenopathy is noted currently. Reproductive: Status post hysterectomy. 5.3 x 3.0 cm left adnexal cystic lesion is noted which is not significantly changed compared to prior exam. 11.5 x 9.1 cm complex cystic abnormality is noted in the pelvis with large peripheral solid components. This is concerning for cystic ovarian malignancy or metastatic disease or possibly loculated ascites and adjacent peritoneal carcinomatosis. Other: No definite hernia is noted. Musculoskeletal: No acute or significant osseous findings. IMPRESSION: Interval development of severe diffuse colonic dilatation with complete obstruction at the level of the distal sigmoid colon, most likely due to external compression from adjacent malignancy or peritoneal implant given history of ovarian carcinoma. There is noted 11.5 x 9.1 cm complex cystic abnormality in the pelvis with large peripheral solid components which is concerning for cystic ovarian malignancy or metastatic disease, or possibly loculated ascites with adjacent peritoneal carcinomatosis. Nonobstructive left nephrolithiasis is noted. Mild bilateral hydroureteronephrosis is noted without obstructing calculus, concerning for distal ureteral obstruction secondary to neoplasm. Aortic Atherosclerosis (ICD10-I70.0). Electronically Signed   By: Lupita Raider M.D.   On: 07/16/2023 11:53    DG Abdomen 1 View Result Date: 07/16/2023 CLINICAL DATA:  Abdominal pain, constipation. EXAM: ABDOMEN - 1 VIEW COMPARISON:  June 07, 2018. FINDINGS: No definite small bowel dilatation is noted. Moderately dilated and air-filled colon is noted most consistent with ileus. Or clips are noted in the pelvis. IMPRESSION: Moderately dilated and air-filled colon is noted concerning for ileus. Electronically Signed   By: Lupita Raider M.D.   On: 07/16/2023 10:56    Anti-infectives: Anti-infectives (From admission, onward)    None        Assessment/Plan  Metastatic ovarian cancer on palliative chemotherapy Hx of  open total abdominal hysterectomy with bilateral salpingo-oophorectomy, omentectomy and radical tumor debulking by Dr. Andrey Farmer in 2020  Malignant large bowel obstruction  - CT 2/23 with increasing size of malignant process with increased ascites and new carcinomatosis, obstruction at the level of distal sigmoid colon secondary to external compression  - thin bilious output from NGT, still requiring pain meds  - ONC saw and would offer further chemo if recovered well s/p diversion  - GYN ONC following and would not recommend any debulking of pelvic tumor - palliative care consult pending  - does not require emergent surgical intervention but will follow closely and would recommend diverting loop colostomy if patient desires aggressive care - consider TPN if no decision in the next 24-48 hrs, will check prealbumin with AM labs   FEN: NPO, NGT to LIWS, IVF per [facility]  VTE: LMWH ID: no current abx  -  per [facility] -  HLD GERD Anxiety   LOS: 2 days   I reviewed Consultant ONC, GYN ONC notes, hospitalist notes, last 24 h vitals and pain scores, last 48 h intake and output, last 24 h labs and trends, and last 24 h imaging results.  This care required high  level of medical decision making.    Juliet Rude, [facility] 07/18/2023, 9:23 AM Please see Amion for pager  number during day hours 7:00am-4:30pm

## 2023-07-19 ENCOUNTER — Encounter (HOSPITAL_COMMUNITY)
Admission: EM | Disposition: A | Discharge status: Home Health | Payer: Self-pay | Source: Home / Self Care | Attending: Family Medicine

## 2023-07-19 ENCOUNTER — Inpatient Hospital Stay (HOSPITAL_COMMUNITY): Payer: Medicare Other | Admitting: Anesthesiology

## 2023-07-19 ENCOUNTER — Encounter: Payer: Self-pay | Admitting: Hematology and Oncology

## 2023-07-19 ENCOUNTER — Encounter (HOSPITAL_COMMUNITY): Payer: Self-pay

## 2023-07-19 DIAGNOSIS — E440 Moderate protein-calorie malnutrition: Secondary | ICD-10-CM | POA: Insufficient documentation

## 2023-07-19 DIAGNOSIS — K56609 Unspecified intestinal obstruction, unspecified as to partial versus complete obstruction: Secondary | ICD-10-CM | POA: Diagnosis not present

## 2023-07-19 DIAGNOSIS — C7960 Secondary malignant neoplasm of unspecified ovary: Secondary | ICD-10-CM

## 2023-07-19 HISTORY — PX: LAPAROTOMY: SHX154

## 2023-07-19 LAB — CBC WITH DIFFERENTIAL/PLATELET
Abs Immature Granulocytes: 0.06 10*3/uL (ref 0.00–0.07)
Basophils Absolute: 0 10*3/uL (ref 0.0–0.1)
Basophils Relative: 0 %
Eosinophils Absolute: 0 10*3/uL (ref 0.0–0.5)
Eosinophils Relative: 0 %
HCT: 30.2 % — ABNORMAL LOW (ref 36.0–46.0)
Hemoglobin: 9.8 g/dL — ABNORMAL LOW (ref 12.0–15.0)
Immature Granulocytes: 1 %
Lymphocytes Relative: 15 %
Lymphs Abs: 0.8 10*3/uL (ref 0.7–4.0)
MCH: 32.2 pg (ref 26.0–34.0)
MCHC: 32.5 g/dL (ref 30.0–36.0)
MCV: 99.3 fL (ref 80.0–100.0)
Monocytes Absolute: 0.9 10*3/uL (ref 0.1–1.0)
Monocytes Relative: 16 %
Neutro Abs: 3.5 10*3/uL (ref 1.7–7.7)
Neutrophils Relative %: 68 %
Platelets: 242 10*3/uL (ref 150–400)
RBC: 3.04 MIL/uL — ABNORMAL LOW (ref 3.87–5.11)
RDW: 13.7 % (ref 11.5–15.5)
WBC: 5.2 10*3/uL (ref 4.0–10.5)
nRBC: 0 % (ref 0.0–0.2)

## 2023-07-19 LAB — TYPE AND SCREEN
ABO/RH(D): A POS
Antibody Screen: NEGATIVE

## 2023-07-19 LAB — BASIC METABOLIC PANEL
Anion gap: 11 (ref 5–15)
BUN: 7 mg/dL — ABNORMAL LOW (ref 8–23)
CO2: 23 mmol/L (ref 22–32)
Calcium: 8.6 mg/dL — ABNORMAL LOW (ref 8.9–10.3)
Chloride: 115 mmol/L — ABNORMAL HIGH (ref 98–111)
Creatinine, Ser: 0.53 mg/dL (ref 0.44–1.00)
GFR, Estimated: >60 mL/min (ref >60)
Glucose, Bld: 106 mg/dL — ABNORMAL HIGH (ref 70–99)
Potassium: 3.2 mmol/L — ABNORMAL LOW (ref 3.5–5.1)
Sodium: 149 mmol/L — ABNORMAL HIGH (ref 135–145)

## 2023-07-19 LAB — PREALBUMIN: Prealbumin: 7 mg/dL — ABNORMAL LOW (ref 18–38)

## 2023-07-19 LAB — MAGNESIUM: Magnesium: 1.7 mg/dL (ref 1.7–2.4)

## 2023-07-19 SURGERY — LAPAROTOMY, EXPLORATORY
Anesthesia: General

## 2023-07-19 MED ORDER — KETAMINE HCL 10 MG/ML IJ SOLN: INTRAMUSCULAR | Status: DC | PRN

## 2023-07-19 MED ORDER — MIDAZOLAM HCL 2 MG/2ML IJ SOLN
INTRAMUSCULAR | Status: AC
  Filled 2023-07-19: qty 2

## 2023-07-19 MED ORDER — LIDOCAINE HCL (PF) 2 % IJ SOLN
INTRAMUSCULAR | Status: AC
Start: 2023-07-19 — End: ?
  Filled 2023-07-19: qty 5

## 2023-07-19 MED ORDER — KETAMINE HCL 50 MG/5ML IJ SOSY
PREFILLED_SYRINGE | INTRAMUSCULAR | Status: AC
  Filled 2023-07-19: qty 5

## 2023-07-19 MED ORDER — HYDROMORPHONE HCL 1 MG/ML IJ SOLN
INTRAMUSCULAR | Status: AC
Start: 2023-07-19 — End: 2023-07-20
  Filled 2023-07-19: qty 1

## 2023-07-19 MED ORDER — ACETAMINOPHEN 325 MG PO TABS
650.0000 mg | ORAL_TABLET | Freq: Four times a day (QID) | ORAL | Status: DC
  Administered 2023-07-20 – 2023-07-21 (×6): 650 mg via ORAL
  Filled 2023-07-19 (×7): qty 2

## 2023-07-19 MED ORDER — SUCCINYLCHOLINE CHLORIDE 200 MG/10ML IV SOSY
PREFILLED_SYRINGE | INTRAVENOUS | Status: AC
  Filled 2023-07-19: qty 10

## 2023-07-19 MED ORDER — CEFAZOLIN SODIUM-DEXTROSE 2-4 GM/100ML-% IV SOLN
2.0000 g | Freq: Once | INTRAVENOUS | Status: AC
  Administered 2023-07-19: 2 g via INTRAVENOUS

## 2023-07-19 MED ORDER — POTASSIUM CHLORIDE 10 MEQ/100ML IV SOLN
10.0000 meq | INTRAVENOUS | Status: AC
Start: 2023-07-19 — End: 2023-07-19
  Administered 2023-07-19 (×4): 10 meq via INTRAVENOUS
  Filled 2023-07-19 (×4): qty 100

## 2023-07-19 MED ORDER — LIDOCAINE 2% (20 MG/ML) 5 ML SYRINGE
INTRAMUSCULAR | Status: DC | PRN
  Administered 2023-07-19: 60 mg via INTRAVENOUS

## 2023-07-19 MED ORDER — MIDAZOLAM HCL 5 MG/5ML IJ SOLN
INTRAMUSCULAR | Status: DC | PRN
  Administered 2023-07-19: 2 mg via INTRAVENOUS

## 2023-07-19 MED ORDER — ENOXAPARIN SODIUM 40 MG/0.4ML IJ SOSY
40.0000 mg | PREFILLED_SYRINGE | INTRAMUSCULAR | Status: DC
  Administered 2023-07-20 – 2023-07-23 (×4): 40 mg via SUBCUTANEOUS
  Filled 2023-07-19 (×4): qty 0.4

## 2023-07-19 MED ORDER — ONDANSETRON HCL 4 MG/2ML IJ SOLN
INTRAMUSCULAR | Status: AC
  Filled 2023-07-19: qty 2

## 2023-07-19 MED ORDER — ONDANSETRON HCL 4 MG/2ML IJ SOLN
INTRAMUSCULAR | Status: DC | PRN
  Administered 2023-07-19: 4 mg via INTRAVENOUS

## 2023-07-19 MED ORDER — DEXAMETHASONE SODIUM PHOSPHATE 10 MG/ML IJ SOLN
INTRAMUSCULAR | Status: AC
  Filled 2023-07-19: qty 1

## 2023-07-19 MED ORDER — HYDROMORPHONE HCL 1 MG/ML IJ SOLN
1.0000 mg | INTRAMUSCULAR | Status: DC | PRN
  Administered 2023-07-19 – 2023-07-23 (×8): 1 mg via INTRAVENOUS
  Filled 2023-07-19 (×8): qty 1

## 2023-07-19 MED ORDER — DROPERIDOL 2.5 MG/ML IJ SOLN: 0.6250 mg | Freq: Once | INTRAMUSCULAR | Status: DC | PRN

## 2023-07-19 MED ORDER — MAGNESIUM SULFATE 2 GM/50ML IV SOLN
2.0000 g | Freq: Once | INTRAVENOUS | Status: AC
  Administered 2023-07-19: 2 g via INTRAVENOUS
  Filled 2023-07-19: qty 50

## 2023-07-19 MED ORDER — DEXAMETHASONE SODIUM PHOSPHATE 10 MG/ML IJ SOLN
INTRAMUSCULAR | Status: AC
Start: 2023-07-19 — End: ?
  Filled 2023-07-19: qty 1

## 2023-07-19 MED ORDER — FENTANYL CITRATE (PF) 100 MCG/2ML IJ SOLN
INTRAMUSCULAR | Status: AC
  Filled 2023-07-19: qty 2

## 2023-07-19 MED ORDER — PROPOFOL 10 MG/ML IV BOLUS
INTRAVENOUS | Status: DC | PRN
  Administered 2023-07-19: 150 mg via INTRAVENOUS

## 2023-07-19 MED ORDER — ACETAMINOPHEN 650 MG RE SUPP
650.0000 mg | Freq: Four times a day (QID) | RECTAL | Status: DC
Start: 2023-07-19 — End: 2023-07-22
  Administered 2023-07-19: 650 mg via RECTAL
  Filled 2023-07-19: qty 1

## 2023-07-19 MED ORDER — ALBUMIN HUMAN 5 % IV SOLN: INTRAVENOUS | Status: DC | PRN

## 2023-07-19 MED ORDER — METHOCARBAMOL 1000 MG/10ML IJ SOLN
500.0000 mg | Freq: Three times a day (TID) | INTRAMUSCULAR | Status: DC
  Administered 2023-07-19 – 2023-07-24 (×15): 500 mg via INTRAVENOUS
  Filled 2023-07-19 (×15): qty 10

## 2023-07-19 MED ORDER — CEFAZOLIN SODIUM-DEXTROSE 2-4 GM/100ML-% IV SOLN
INTRAVENOUS | Status: AC
Start: 2023-07-19 — End: 2023-07-19
  Filled 2023-07-19: qty 100

## 2023-07-19 MED ORDER — LACTATED RINGERS IV SOLN: INTRAVENOUS | Status: DC | PRN

## 2023-07-19 MED ORDER — FENTANYL CITRATE (PF) 100 MCG/2ML IJ SOLN
INTRAMUSCULAR | Status: DC | PRN
  Administered 2023-07-19: 50 ug via INTRAVENOUS

## 2023-07-19 MED ORDER — SUCCINYLCHOLINE CHLORIDE 200 MG/10ML IV SOSY
PREFILLED_SYRINGE | INTRAVENOUS | Status: DC | PRN
  Administered 2023-07-19: 100 mg via INTRAVENOUS

## 2023-07-19 MED ORDER — HYDROMORPHONE HCL 1 MG/ML IJ SOLN
0.2500 mg | INTRAMUSCULAR | Status: DC | PRN
  Administered 2023-07-19: 0.25 mg via INTRAVENOUS
  Administered 2023-07-19 (×2): 0.5 mg via INTRAVENOUS

## 2023-07-19 MED ORDER — EPHEDRINE SULFATE-NACL 50-0.9 MG/10ML-% IV SOSY
PREFILLED_SYRINGE | INTRAVENOUS | Status: DC | PRN
  Administered 2023-07-19: 5 mg via INTRAVENOUS

## 2023-07-19 MED ORDER — HYDROMORPHONE HCL 1 MG/ML IJ SOLN
0.5000 mg | INTRAMUSCULAR | Status: DC | PRN
  Administered 2023-07-20 – 2023-07-21 (×4): 0.5 mg via INTRAVENOUS
  Filled 2023-07-19 (×5): qty 0.5

## 2023-07-19 MED ORDER — PROPOFOL 10 MG/ML IV BOLUS
INTRAVENOUS | Status: AC
  Filled 2023-07-19: qty 20

## 2023-07-19 MED ORDER — OXYCODONE HCL 5 MG PO TABS
5.0000 mg | ORAL_TABLET | ORAL | Status: DC | PRN
  Administered 2023-07-19: 5 mg via ORAL
  Administered 2023-07-20 – 2023-07-24 (×9): 10 mg via ORAL
  Filled 2023-07-19 (×10): qty 2

## 2023-07-19 MED ORDER — ALBUMIN HUMAN 5 % IV SOLN
INTRAVENOUS | Status: AC
  Filled 2023-07-19: qty 250

## 2023-07-19 MED ORDER — DEXAMETHASONE SODIUM PHOSPHATE 10 MG/ML IJ SOLN
INTRAMUSCULAR | Status: DC | PRN
  Administered 2023-07-19: 10 mg via INTRAVENOUS

## 2023-07-19 MED ORDER — SODIUM CHLORIDE 0.9 % IR SOLN
Status: DC | PRN
  Administered 2023-07-19: 2000 mL

## 2023-07-19 MED ORDER — ROCURONIUM BROMIDE 10 MG/ML (PF) SYRINGE
PREFILLED_SYRINGE | INTRAVENOUS | Status: DC | PRN
  Administered 2023-07-19: 20 mg via INTRAVENOUS
  Administered 2023-07-19: 60 mg via INTRAVENOUS

## 2023-07-19 MED ORDER — HYDROMORPHONE HCL 1 MG/ML IJ SOLN
INTRAMUSCULAR | Status: AC
  Filled 2023-07-19: qty 1

## 2023-07-19 MED ORDER — KETAMINE HCL 50 MG/5ML IJ SOSY
PREFILLED_SYRINGE | INTRAMUSCULAR | Status: DC | PRN
Start: 2023-07-19 — End: 2023-07-19
  Administered 2023-07-19: 20 mg via INTRAVENOUS
  Administered 2023-07-19: 30 mg via INTRAVENOUS

## 2023-07-19 MED ORDER — SUGAMMADEX SODIUM 200 MG/2ML IV SOLN
INTRAVENOUS | Status: DC | PRN
  Administered 2023-07-19: 200 mg via INTRAVENOUS

## 2023-07-19 MED ORDER — ROCURONIUM BROMIDE 10 MG/ML (PF) SYRINGE
PREFILLED_SYRINGE | INTRAVENOUS | Status: AC
Start: 2023-07-19 — End: ?
  Filled 2023-07-19: qty 10

## 2023-07-19 SURGICAL SUPPLY — 40 items
APPLICATOR COTTON TIP 6 STRL (MISCELLANEOUS) ×1 IMPLANT
APPLICATOR COTTON TIP 6IN STRL (MISCELLANEOUS) ×1 IMPLANT
BAG COUNTER SPONGE SURGICOUNT (BAG) IMPLANT
BLADE EXTENDED COATED 6.5IN (ELECTRODE) IMPLANT
BLADE HEX COATED 2.75 (ELECTRODE) ×1 IMPLANT
CATH ROBINSON RED A/P 16FR (CATHETERS) IMPLANT
CHLORAPREP W/TINT 26 (MISCELLANEOUS) ×1 IMPLANT
COVER MAYO STAND STRL (DRAPES) IMPLANT
COVER SURGICAL LIGHT HANDLE (MISCELLANEOUS) ×1 IMPLANT
DRAPE LAPAROSCOPIC ABDOMINAL (DRAPES) ×1 IMPLANT
DRAPE WARM FLUID 44X44 (DRAPES) IMPLANT
DRSG OPSITE POSTOP 4X10 (GAUZE/BANDAGES/DRESSINGS) IMPLANT
ELECT REM PT RETURN 15FT ADLT (MISCELLANEOUS) ×1 IMPLANT
GAUZE SPONGE 4X4 12PLY STRL (GAUZE/BANDAGES/DRESSINGS) ×1 IMPLANT
GLOVE BIO SURGEON STRL SZ7.5 (GLOVE) ×2 IMPLANT
GLOVE BIOGEL PI IND STRL 7.0 (GLOVE) ×1 IMPLANT
GOWN STRL REUS W/ TWL LRG LVL3 (GOWN DISPOSABLE) ×1 IMPLANT
GOWN STRL REUS W/ TWL XL LVL3 (GOWN DISPOSABLE) ×1 IMPLANT
HANDLE SUCTION POOLE (INSTRUMENTS) IMPLANT
KIT BASIN OR (CUSTOM PROCEDURE TRAY) ×1 IMPLANT
KIT TURNOVER KIT A (KITS) IMPLANT
LIGASURE IMPACT 36 18CM CVD LR (INSTRUMENTS) IMPLANT
NS IRRIG 1000ML POUR BTL (IV SOLUTION) ×1 IMPLANT
PACK GENERAL/GYN (CUSTOM PROCEDURE TRAY) ×1 IMPLANT
RELOAD PROXIMATE 75MM BLUE (ENDOMECHANICALS) IMPLANT
RELOAD STAPLE 75 3.8 BLU REG (ENDOMECHANICALS) IMPLANT
SPONGE T-LAP 18X18 ~~LOC~~+RFID (SPONGE) IMPLANT
STAPLER PROXIMATE 75MM BLUE (STAPLE) ×1 IMPLANT
STAPLER SKIN PROX WIDE 3.9 (STAPLE) ×1 IMPLANT
SUCTION POOLE HANDLE (INSTRUMENTS) IMPLANT
SUT PDS AB 1 TP1 96 (SUTURE) IMPLANT
SUT SILK 2 0 SH CR/8 (SUTURE) IMPLANT
SUT SILK 2-0 18XBRD TIE 12 (SUTURE) IMPLANT
SUT SILK 3 0 SH CR/8 (SUTURE) IMPLANT
SUT SILK 3-0 18XBRD TIE 12 (SUTURE) IMPLANT
SUT VIC AB 3-0 SH 18 (SUTURE) IMPLANT
TOWEL OR 17X26 10 PK STRL BLUE (TOWEL DISPOSABLE) ×2 IMPLANT
TRAY FOLEY MTR SLVR 16FR STAT (SET/KITS/TRAYS/PACK) IMPLANT
WATER STERILE IRR 1000ML POUR (IV SOLUTION) ×1 IMPLANT
YANKAUER SUCT BULB TIP NO VENT (SUCTIONS) IMPLANT

## 2023-07-19 NOTE — Telephone Encounter (Signed)
 Stacy Rivas

## 2023-07-19 NOTE — Anesthesia Preprocedure Evaluation (Addendum)
 Anesthesia Evaluation  Patient identified by MRN, date of birth, ID band Patient awake    Reviewed: Allergy & Precautions, NPO status , Patient's Chart, lab work & pertinent test results, Unable to perform ROS - Chart review only  Airway Mallampati: II  TM Distance: >3 FB Neck ROM: Full    Dental  (+) Dental Advisory Given   Pulmonary former smoker   Pulmonary exam normal        Cardiovascular hypertension, Normal cardiovascular exam  Echo 03/2023  1. Left ventricular ejection fraction, by estimation, is 60 to 65%. The left ventricle has normal function. The left ventricle has no regional wall motion abnormalities. There is mild left ventricular hypertrophy. Left ventricular diastolic parameters are consistent with Grade I diastolic dysfunction (impaired relaxation). The average left ventricular global longitudinal strain is -19.3 %. The global longitudinal strain is normal.   2. Right ventricular systolic function is normal. The right ventricular size is normal. Tricuspid regurgitation signal is inadequate for assessing PA pressure.   3. The mitral valve is normal in structure. Trivial mitral valve regurgitation. No evidence of mitral stenosis.   4. The aortic valve was not well visualized. Aortic valve regurgitation is trivial. No aortic stenosis is present.   5. The inferior vena cava is normal in size with greater than 50% respiratory variability, suggesting right atrial pressure of 3 mmHg.     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   Mild peritoneal nodularity/omental caking, improved.   Small abdominopelvic ascites, improved.  Reproductive/Obstetrics                              Anesthesia Physical Anesthesia Plan  ASA: 3  Anesthesia Plan: General   Post-op Pain Management: Ofirmev IV (intra-op)*   Induction: Intravenous, Rapid sequence and Cricoid pressure planned  PONV Risk Score and Plan: 4 or greater and Ondansetron, Dexamethasone, Midazolam, Treatment may vary due to age or medical condition and Metaclopromide  Airway Management Planned: Oral ETT  Additional Equipment: None  Intra-op Plan:   Post-operative Plan: Extubation in OR  Informed Consent: I have reviewed the patients History and Physical, chart, labs and discussed the procedure including the risks, benefits and alternatives for the proposed anesthesia with the patient or authorized representative who has indicated his/her understanding and acceptance.     Dental advisory given  Plan Discussed with: CRNA and Anesthesiologist  Anesthesia Plan Comments: (2 x PIV)         Anesthesia Quick Evaluation

## 2023-07-19 NOTE — Plan of Care (Signed)

## 2023-07-19 NOTE — Transfer of Care (Signed)
 Immediate Anesthesia Transfer of Care Note  Patient: Stacy Rivas  Procedure(s) Performed: EXPLORATORY LAPAROTOMY, DIVERTING LOOP COLOSTOMY  Patient Location: PACU  Anesthesia Type:General  Level of Consciousness: sedated and responds to stimulation  Airway & Oxygen Therapy: Patient Spontanous Breathing and Patient connected to face mask oxygen  Post-op Assessment: Report given to RN  Post vital signs: stable  Last Vitals:  Vitals Value Taken Time  BP 144/78 07/19/23 1520  Temp    Pulse 75 07/19/23 1525  Resp 16 07/19/23 1525  SpO2 100 % 07/19/23 1525  Vitals shown include unfiled device data.  Last Pain:  Vitals:   07/19/23 1520  TempSrc:   PainSc: Asleep      Patients Stated Pain Goal: 3 (07/19/23 1230)  Complications: No notable events documented.

## 2023-07-19 NOTE — Progress Notes (Signed)
 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 The patient knows to call the clinic with any problems, questions or concerns.   The total time spent in the appointment was 55 minutes encounter with patients including review of chart and various tests results, discussions about plan of care and coordination of care plan  Artis Delay, MD 07/19/2023 12:45 PM  Subjective:  Patient is seen in the room.  Multiple family members available I reviewed her imaging studies She had several bowel movement this morning She continues to have sensation of bloating We discussed risk and benefits of surgery  Objective:  Vitals:   07/19/23 0351 07/19/23 1211  BP: (!) 160/85 (!) 150/83  Pulse: 83 85  Resp: 20 18  Temp: 97.6 F (36.4 C) 97.7 F (36.5 C)  SpO2: 93% 92%     Intake/Output Summary (Last 24 hours) at 07/19/2023 1245 Last data filed at 07/19/2023 0500 Gross per 24 hour  Intake 0 ml  Output 140 ml  Net -140 ml    GENERAL:alert, no distress and comfortable.  NG tube on  suction   Labs:  Recent Labs    07/16/23 1128 07/17/23 0411 07/18/23 1015 07/19/23 1106  NA 138 139 145 149*  K 3.1* 3.6 3.4* 3.2*  CL 98 106 113* 115*  CO2 23 24 23 23   GLUCOSE 109* 152* 129* 106*  BUN 16 14 8  7*  CREATININE 0.69 0.47 0.47 0.53  CALCIUM 9.3 8.1* 8.2* 8.6*  GFRNONAA >60 >60 >60 >60  PROT 6.7 5.8* 5.9*  --   ALBUMIN 3.3* 2.8* 2.7*  --   AST 27 21 20   --   ALT 22 18 16   --   ALKPHOS 156* 124 111  --   BILITOT 1.3* 0.4 0.6  --     Studies: I have personally reviewed her CT imaging DG Abd 1 View Result Date: 07/16/2023 CLINICAL DATA:  Abdominal pain EXAM: ABDOMEN - 1 VIEW COMPARISON:  Abdominal radiographs 07/16/2023 FINDINGS: NG tube in stomach.  Side port below the GE junction. Mild improvement in gaseous distention of colon. For example transverse colon measures 5.7 cm compared to 6.1. Cecum remains distended. No dilated loops of small bowel. IMPRESSION: 1. NG tube in stomach. 2. Some improvement in gaseous distention of the colon. Electronically Signed   By: Genevive Bi M.D.   On: 07/16/2023 17:48   CT ABDOMEN PELVIS WO CONTRAST Result Date: 07/16/2023 CLINICAL DATA:  Generalized abdominal pain for 4 days. History of ovarian cancer. EXAM: CT ABDOMEN AND PELVIS WITHOUT CONTRAST TECHNIQUE: Multidetector CT imaging of the abdomen  and pelvis was performed following the standard protocol without IV contrast. RADIATION DOSE REDUCTION: This exam was performed according to the departmental dose-optimization program which includes automated exposure control, adjustment of the mA and/or kV according to patient size and/or use of iterative reconstruction technique. COMPARISON:  July 07, 2023 FINDINGS: Lower chest: No acute abnormality. Hepatobiliary: No focal liver abnormality is seen. No gallstones, gallbladder wall thickening, or biliary dilatation. Pancreas: Unremarkable. No pancreatic ductal dilatation or surrounding inflammatory changes. Spleen: Normal in size  without focal abnormality. Adrenals/Urinary Tract: Adrenal glands appear normal. Nonobstructive left nephrolithiasis is noted. Mild bilateral hydroureteronephrosis is noted without obstructing calculus. This is concerning for distal ureteral obstruction. Urinary bladder is decompressed. Stomach/Bowel: Stomach and appendix are unremarkable. No significant small bowel dilatation is noted. However, there is severe colonic dilatation which appears to be due to distal sigmoid colon obstruction, most likely due to external compression from adjacent peritoneal implant or malignancy. Vascular/Lymphatic: Aortic atherosclerosis. No significant adenopathy is noted currently. Reproductive: Status post hysterectomy. 5.3 x 3.0 cm left adnexal cystic lesion is noted which is not significantly changed compared to prior exam. 11.5 x 9.1 cm complex cystic abnormality is noted in the pelvis with large peripheral solid components. This is concerning for cystic ovarian malignancy or metastatic disease or possibly loculated ascites and adjacent peritoneal carcinomatosis. Other: No definite hernia is noted. Musculoskeletal: No acute or significant osseous findings. IMPRESSION: Interval development of severe diffuse colonic dilatation with complete obstruction at the level of the distal sigmoid colon, most likely due to external compression from adjacent malignancy or peritoneal implant given history of ovarian carcinoma. There is noted 11.5 x 9.1 cm complex cystic abnormality in the pelvis with large peripheral solid components which is concerning for cystic ovarian malignancy or metastatic disease, or possibly loculated ascites with adjacent peritoneal carcinomatosis. Nonobstructive left nephrolithiasis is noted. Mild bilateral hydroureteronephrosis is noted without obstructing calculus, concerning for distal ureteral obstruction secondary to neoplasm. Aortic Atherosclerosis (ICD10-I70.0). Electronically Signed   By: Lupita Raider M.D.    On: 07/16/2023 11:53   DG Abdomen 1 View Result Date: 07/16/2023 CLINICAL DATA:  Abdominal pain, constipation. EXAM: ABDOMEN - 1 VIEW COMPARISON:  June 07, 2018. FINDINGS: No definite small bowel dilatation is noted. Moderately dilated and air-filled colon is noted most consistent with ileus. Or clips are noted in the pelvis. IMPRESSION: Moderately dilated and air-filled colon is noted concerning for ileus. Electronically Signed   By: Lupita Raider M.D.   On: 07/16/2023 10:56   CT CHEST ABDOMEN PELVIS W CONTRAST Result Date: 07/10/2023 CLINICAL DATA:  History of recurrent ovarian cancer, follow-up. * Tracking Code: BO * EXAM: CT CHEST, ABDOMEN, AND PELVIS WITH CONTRAST TECHNIQUE: Multidetector CT imaging of the chest, abdomen and pelvis was performed following the standard protocol during bolus administration of intravenous contrast. RADIATION DOSE REDUCTION: This exam was performed according to the departmental dose-optimization program which includes automated exposure control, adjustment of the mA and/or kV according to patient size and/or use of iterative reconstruction technique. CONTRAST:  OMNIPAQUE IOHEXOL 300 MG/ML  SOLN COMPARISON:  Multiple priors including CT April 10, 2023 FINDINGS: CT CHEST FINDINGS Cardiovascular: Right chest Port-A-Cath with tip near the superior cavoatrial junction. Normal caliber thoracic aorta. Normal size heart. Mediastinum/Nodes: No suspicious thyroid nodule. No pathologically enlarged mediastinal, hilar or axillary lymph nodes. Esophagus is grossly unremarkable. Lungs/Pleura: Biapical pleuroparenchymal scarring. 4 mm left lower lobe pulmonary nodule on image 62/6 stable from coronary CT Oct 06, 2021 suggestive of a  benign finding. No new suspicious pulmonary nodules or masses. Musculoskeletal: No aggressive lytic or blastic lesion of bone. CT ABDOMEN PELVIS FINDINGS Hepatobiliary: New heterogeneous 2.3 cm subcapsular segment VI hepatic lesion on image 57/2  and 14 mm segment VI/VII hepatic lesion on image 55/2. Gallbladder is unremarkable.  No biliary ductal dilation. Pancreas: No pancreatic ductal dilation or evidence of acute inflammation. Spleen: No splenomegaly. Adrenals/Urinary Tract: No suspicious adrenal nodule/mass. Hydronephrosis. Nonobstructive left renal stones measure up to 6 mm. Kidneys demonstrate symmetric enhancement. Mild symmetric wall thickening of an incompletely distended urinary bladder. Stomach/Bowel: Stomach is unremarkable for degree of distension. No pathologic dilation of small or large bowel. No evidence of acute bowel inflammation. Vascular/Lymphatic: Normal caliber abdominal aorta. Aortic atherosclerosis. Smooth IVC contours. The portal, splenic and superior mesenteric veins are patent. Increased size of the low-density left external iliac lymph node now measuring 14 mm in short axis on image 93/2 previously 7 mm. Reproductive: Increased size of the heterogeneous lesion in the arising from the right vaginal cuff now measuring 4.9 x 3.4 cm on image 105/2 previously 2.5 x 1.9 cm. Left ovarian cystic lesion with persistent enhancing soft tissue along the margin of the lesion for instance on image 101/2 now in total measures 5.3 x 5.1 cm on image 108/2 previously 4.2 x 4.2 cm. Other: Increased abdominopelvic free fluid with a new loculated collection in the pelvis. Few new scattered soft tissue nodules in the omentum/peritoneum for instance along the right pericolic gutter measuring 7 mm on image 93/2 and in the left upper quadrant measuring 5 mm on image 53/2. Musculoskeletal: No aggressive lytic or blastic lesion of bone. IMPRESSION: 1. Increased size of the heterogeneous lesion arising from the right vaginal cuff, increased size of the left ovarian cystic lesion with persistent enhancing soft tissue along the margin of the lesion, and increased size of the low-density left external iliac lymph node, compatible with worsening disease. 2.  Increased abdominopelvic free fluid with a new loculated collection in the pelvis and a few new scattered soft tissue nodules in the omentum/peritoneum, compatible with peritoneal carcinomatosis. 3. New heterogeneous 2.3 cm subcapsular segment VI hepatic lesion and 14 mm segment VI/VII hepatic lesion, compatible with hepatic metastatic disease. 4. No evidence of metastatic disease in the chest. 5. Nonobstructive left renal stones measure up to 6 mm. 6. Mild symmetric wall thickening of an incompletely distended urinary bladder. Correlate with urinalysis to exclude cystitis. 7.  Aortic Atherosclerosis (ICD10-I70.0). Electronically Signed   By: Maudry Mayhew M.D.   On: 07/10/2023 11:39

## 2023-07-19 NOTE — Anesthesia Postprocedure Evaluation (Signed)
 Anesthesia Post Note  Patient: Stacy Rivas  Procedure(s) Performed: EXPLORATORY LAPAROTOMY, DIVERTING LOOP COLOSTOMY     Patient location during evaluation: PACU Anesthesia Type: General Level of consciousness: awake and alert Pain management: pain level controlled Vital Signs Assessment: post-procedure vital signs reviewed and stable Respiratory status: spontaneous breathing, nonlabored ventilation and respiratory function stable Cardiovascular status: stable and blood pressure returned to baseline Anesthetic complications: no   No notable events documented.  Last Vitals:  Vitals:   07/19/23 1545 07/19/23 1550  BP: (!) 153/85   Pulse: 79   Resp: 18 19  Temp:    SpO2: 100% 100%    Last Pain:  Vitals:   07/19/23 1619  TempSrc:   PainSc: Asleep                 Beryle Lathe

## 2023-07-19 NOTE — Op Note (Signed)
 07/19/2023  3:09 PM  PATIENT:  Stacy Rivas  68 y.o. female  PRE-OPERATIVE DIAGNOSIS:  METASTAIC OVARIAN CANCER, COLONIC OBSTRUCTION  POST-OPERATIVE DIAGNOSIS:  METASTAIC OVARIAN CANCER, COLONIC OBSTRUCTION  PROCEDURE:  Procedure(s): EXPLORATORY LAPAROTOMY, DIVERTING LOOP COLOSTOMY (N/A)  SURGEON:  Surgeons and Role:    * Griselda Miner, MD - Primary  PHYSICIAN ASSISTANT:   ASSISTANTS: Bailey Mech, PA   ANESTHESIA:   general  EBL:  100 mL   BLOOD ADMINISTERED:none  DRAINS: none   LOCAL MEDICATIONS USED:  NONE  SPECIMEN:  No Specimen  DISPOSITION OF SPECIMEN:  N/A  COUNTS:  YES  TOURNIQUET:  * No tourniquets in log *  DICTATION: .Dragon Dictation  After informed consent was obtained the patient was brought to the operating room and placed in the supine position on the operating table.  After adequate induction of general anesthesia the patient's abdomen was prepped with ChloraPrep, allowed to dry, and draped in usual sterile manner.  An appropriate timeout was performed.  A midline incision was then made with a 10 blade knife.  The incision was carried through the skin and subcutaneous tissue sharply with the electrocautery until the linea alba is identified.  The linea alba is also incised with the electrocautery.  Each side was grasped with Kocher clamps and elevated anteriorly.  The preperitoneal space was probed bluntly with a hemostat until the peritoneum was opened and access was gained to the abdominal cavity.  The rest of the incision was then opened under direct vision.  The patient's right and transverse colon were fairly dilated.  The left colon was not very dilated and not very mobile.  I generally inspected the abdominal cavity and I saw no peritoneal seeding except for some stuff that felt like small grains of sand along the anterior abdominal wall high in the right upper quadrant.  The surface of the liver felt good that I could feel.  The pelvis was fairly  stuck and solid.  The transverse colon though was very mobile and clean appearing.  The transverse colon easily reach the marks that the wound care nurses placed for the ostomy.  At this point because there was no significant peritoneal studding that we could see we elected not to extend the incision superiorly to place a gastrostomy tube.  We then removed a circle of skin with a core of fatty tissue from the projected ostomy site on the left mid abdomen.  I made a cruciate incision in the fascia of the anterior abdominal wall so that 3 fingers could come through easily.  At the site that was chosen on the transverse colon for bringing out the ostomy I open the mesentery sharply with the electrocautery.  I placed a 16 French red rubber catheter around the transverse colon at this point.  We then used the catheter to bring the transverse colon out through the abdominal wall for the ostomy.  Care was taken to make sure the ostomy was not twisted.  At this point the fascia of the anterior abdominal wall was closed with 2 running #1 double-stranded looped PDS sutures.  The subcutaneous tissue was irrigated with copious amounts of saline.  The skin was closed with staples and a sterile green towel was placed over the staples.  Next the transverse colon was then opened and matured with interrupted 3-0 Vicryl stitches around the red rubber catheter.  The ostomy was healthy and patent.  A sterile dressing was applied to the midline wound and  then a ostomy bag was applied to the loop colostomy.  The patient tolerated the procedure well.  At the end of the case all needle sponge and instrument counts were correct.  The patient was then awakened and taken to recovery in stable condition.  PLAN OF CARE: Admit to inpatient   PATIENT DISPOSITION:  PACU - hemodynamically stable.   Delay start of Pharmacological VTE agent (>24hrs) due to surgical blood loss or risk of bleeding: no

## 2023-07-19 NOTE — Progress Notes (Signed)
*   Day of Surgery *   Subjective/Chief Complaint: No complaints. Had a large bm today   Objective: Vital signs in last 24 hours: Temp:  [97.5 F (36.4 C)-97.8 F (36.6 C)] 97.7 F (36.5 C) (02/26 1211) Pulse Rate:  [76-85] 85 (02/26 1211) Resp:  [16-20] 18 (02/26 1211) BP: (149-163)/(73-87) 150/83 (02/26 1211) SpO2:  [92 %-97 %] 92 % (02/26 1211) Last BM Date : 07/06/23  Intake/Output from previous day: 02/25 0701 - 02/26 0700 In: 30 [P.O.:30] Out: 140 [Emesis/NG output:140] Intake/Output this shift: No intake/output data recorded.  General appearance: alert and cooperative Resp: clear to auscultation bilaterally Cardio: regular rate and rhythm GI: soft, mild distension  Lab Results:  Recent Labs    07/18/23 1015 07/19/23 1106  WBC 5.1 5.2  HGB 10.0* 9.8*  HCT 31.0* 30.2*  PLT 262 242   BMET Recent Labs    07/18/23 1015 07/19/23 1106  NA 145 149*  K 3.4* 3.2*  CL 113* 115*  CO2 23 23  GLUCOSE 129* 106*  BUN 8 7*  CREATININE 0.47 0.53  CALCIUM 8.2* 8.6*   PT/INR No results for input(s): "LABPROT", "INR" in the last 72 hours. ABG No results for input(s): "PHART", "HCO3" in the last 72 hours.  Invalid input(s): "PCO2", "PO2"  Studies/Results: No results found.  Anti-infectives: Anti-infectives (From admission, onward)    None       Assessment/Plan: s/p Procedure(s): EXPLORATORY LAPAROTOMY, DIVERTING COLOSTOMY (N/A) Continue ng and bowel rest Pt may benefit from diversion. Plan for possible loop colostomy today Metastatic ovarian cancer per oncology. Poor prognosis Risks and benefits of the surgery discussed with pt and she understands and wishes to proceed  LOS: 3 days    Chevis Pretty III 07/19/2023

## 2023-07-19 NOTE — Plan of Care (Signed)
 Palliative-   Consult received. Chart reviewed. Attempted to see patient- she was off floor for procedure. Will make attempt at a later time.   Ocie Bob, AGNP-C Palliative Medicine  No charge

## 2023-07-19 NOTE — Progress Notes (Addendum)
 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  constipation has been going on for 1 week or more.  Has tried several laxatives and stool softeners without improvement.  CT done in the ER shows severe diffuse colonic dilatation with complete obstruction at the level of the distal sigmoid colon due to compression from adjacent malignancy.  Peritoneal implant measuring 11.5 x 9.1 cm.  She has been seen by general surgery recommending a diverting colostomy. She is followed by Dr. [name]. Seen by GYN oncology Dr. [name].  Outpatient oncology records noted. She has had total abdominal hysterectomy and bilateral salpingo-oophorectomy omentectomy radical tumor debulking by Dr. Andrey Farmer in 2020.  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  Seen by general surgery, per them, colonic stent is not an option, they have recommended diverting colostomy.  Patient was reluctant about that.  Patient has had 3 bowel movements in last 24 hours and pain is improved. GYN ONC following and would not recommend any debulking of pelvic tumor.  Dr. [name] saw her and patient is in agreement with moving forward with the surgery, general surgery has been made aware as well.  As outpatient, she is on palliative chemotherapy which is on hold.  Palliative care has been consulted and she is awaiting evaluation as well.   Hypokalemia/hypomagnesemia: Replenished both.  Rechecking labs today. Addendum:  Results back.  Potassium down to 3.2.  Will replenish through IV.  Potassium normal but at borderline low.  Will replenish that as well.   anemia of chronic disease hemoglobin stable.   GERD: Continue Protonix   anxiety takes Xanax at home but she is n.p.o. so she is on IV as needed Ativan here.   moderate protein calorie malnutrition due to acute illness: Will need TPN started soon.  DVT prophylaxis: enoxaparin (LOVENOX) injection 40 mg Start: 07/16/23 2200   Code Status: Full Code  Family Communication: Husband and daughter present at bedside.  Plan of care discussed with patient in length and he/she verbalized understanding and agreed with it.  Status is: Inpatient Remains inpatient appropriate because: Needs abdominal surgery   Estimated body mass index is 26.34 kg/m as calculated from the following:   Height as of this encounter: 5\' 2"  (1.575 m).   Weight as of this encounter: 65.3 kg.    Nutritional Assessment: Body mass index is 26.34 kg/m.[name] Kitchen Seen by dietician.  I agree with the assessment and plan as outlined below: Nutrition Status: Nutrition Problem: Moderate Malnutrition Etiology: acute illness, constipation Signs/Symptoms: mild fat depletion, energy intake < 75% for > 7 days Interventions: Refer to RD note for recommendations  . Skin Assessment: I have examined the patient's skin and I agree with the wound assessment as performed by the wound care RN as outlined below:    Consultants:  GYN oncology, general oncology, general surgery  Procedures:  As above  Antimicrobials:  Anti-infectives (From admission, onward)    None         Subjective: Patient seen and  examined.  She has no complaints.  Abdominal pain has improved.  No nausea.  Objective: Vitals:   07/18/23 0452 07/18/23 1247 07/18/23 2035 07/19/23 0351  BP: 130/84 (!) 163/73 (!) 149/87 (!) 160/85  Pulse: 76 82 76 83  Resp: 15 16 20 20   Temp: 98.2 F (36.8 C) (!) 97.5 F (36.4 C) 97.8  F (36.6 C) 97.6 F (36.4 C)  TempSrc: Oral Oral Oral Oral  SpO2: 94% 97% 93% 93%  Weight:      Height:        Intake/Output Summary (Last 24 hours) at 07/19/2023 1119 Last data filed at 07/19/2023 0500 Gross per 24 hour  Intake 0 ml  Output 140 ml  Net -140 ml   Filed Weights   07/16/23 1001  Weight: 65.3 kg    Examination:  General exam: Appears calm and comfortable  Respiratory system: Clear to auscultation. Respiratory effort normal. Cardiovascular system: S1 & S2 heard, RRR. No JVD, murmurs, rubs, gallops or clicks. No pedal edema. Gastrointestinal system: Abdomen is nondistended, soft and nontender. No organomegaly or masses felt. Normal bowel sounds heard. Central nervous system: Alert and oriented. No focal neurological deficits. Extremities: Symmetric 5 x 5 power. Skin: No rashes, lesions or ulcers Psychiatry: Judgement and insight appear normal. Mood & affect appropriate.    Data Reviewed: I have personally reviewed following labs and imaging studies  CBC: Recent Labs  Lab 07/16/23 1128 07/17/23 0411 07/18/23 1015  WBC 5.3 5.7 5.1  NEUTROABS 3.5  --   --   HGB 12.1 10.5* 10.0*  HCT 37.2 32.4* 31.0*  MCV 96.6 97.6 98.4  PLT 300 260 262   Basic Metabolic Panel: Recent Labs  Lab 07/16/23 1128 07/17/23 0411 07/18/23 1015  NA 138 139 145  K 3.1* 3.6 3.4*  CL 98 106 113*  CO2 23 24 23   GLUCOSE 109* 152* 129*  BUN 16 14 8   CREATININE 0.69 0.47 0.47  CALCIUM 9.3 8.1* 8.2*  MG 1.9  --  1.8  PHOS 4.1  --   --    GFR: Estimated Creatinine Clearance: 58.9 mL/min (by C-G formula based on SCr of 0.47 mg/dL). Liver Function Tests: Recent Labs  Lab 07/16/23 1128 07/17/23 0411 07/18/23 1015  AST 27 21 20   ALT 22 18 16   ALKPHOS 156* 124 111  BILITOT 1.3* 0.4 0.6  PROT 6.7 5.8* 5.9*  ALBUMIN 3.3* 2.8* 2.7*   No results for input(s): "LIPASE", "AMYLASE" in the last 168 hours. No results for input(s): "AMMONIA" in the last 168 hours. Coagulation  Profile: No results for input(s): "INR", "PROTIME" in the last 168 hours. Cardiac Enzymes: No results for input(s): "CKTOTAL", "CKMB", "CKMBINDEX", "TROPONINI" in the last 168 hours. BNP (last 3 results) No results for input(s): "PROBNP" in the last 8760 hours. HbA1C: No results for input(s): "HGBA1C" in the last 72 hours. CBG: No results for input(s): "GLUCAP" in the last 168 hours. Lipid Profile: No results for input(s): "CHOL", "HDL", "LDLCALC", "TRIG", "CHOLHDL", "LDLDIRECT" in the last 72 hours. Thyroid Function Tests: No results for input(s): "TSH", "T4TOTAL", "FREET4", "T3FREE", "THYROIDAB" in the last 72 hours. Anemia Panel: No results for input(s): "VITAMINB12", "FOLATE", "FERRITIN", "TIBC", "IRON", "RETICCTPCT" in the last 72 hours. Sepsis Labs: No results for input(s): "PROCALCITON", "LATICACIDVEN" in the last 168 hours.  No results found for this or any previous visit (from the past 240 hours).   Radiology Studies: No results found.  Scheduled Meds:  enoxaparin (LOVENOX) injection  40 mg Subcutaneous Q24H  ondansetron  4 mg Oral Q6H   Or   ondansetron (ZOFRAN) IV  4 mg Intravenous Q6H   pantoprazole (PROTONIX) IV  40 mg Intravenous Q24H   sodium chloride flush  3 mL Intravenous Q12H   Continuous Infusions:  dextrose 5 % and 0.9 % NaCl with KCl 40 mEq/L 100 mL/hr at 07/18/23 1440     LOS: 3 days   Hughie Closs, MD [facility]  07/19/2023, 11:19 AM   *Please note that this is a verbal dictation therefore any spelling or grammatical errors are due to the "Dragon Medical One" system interpretation.  Please page via Amion and do not message via secure chat for urgent patient care matters. Secure chat can be used for non urgent patient care matters.  How to contact the [facility] Attending or Consulting provider 7A - 7P or covering provider during after hours 7P -7A, for this patient?  Check the care team in [facility] and look for a) attending/consulting [facility] provider  listed and b) the [facility] team listed. Page or secure chat 7A-7P. Log into [email or web address] and use [place]'s universal password to access. If you do not have the password, please contact the hospital operator. Locate the [facility] provider you are looking for under [facility] and page to a number that you can be directly reached. If you still have difficulty reaching the provider, please page the [facility] (Director on Call) for the Hospitalists listed on amion for assistance.

## 2023-07-19 NOTE — Anesthesia Procedure Notes (Signed)
 Procedure Name: Intubation Date/Time: 07/19/2023 1:48 PM  Performed by: Orest Dikes, CRNAPre-anesthesia Checklist: Patient identified, Emergency Drugs available, Suction available and Patient being monitored Patient Re-evaluated:Patient Re-evaluated prior to induction Oxygen Delivery Method: Circle system utilized Preoxygenation: Pre-oxygenation with 100% oxygen Induction Type: IV induction and Rapid sequence Laryngoscope Size: Mac and 4 Grade View: Grade I Tube type: Oral Tube size: 7.0 mm Number of attempts: 1 Airway Equipment and Method: Stylet Placement Confirmation: ETT inserted through vocal cords under direct vision, positive ETCO2 and breath sounds checked- equal and bilateral Secured at: 22 cm Tube secured with: Tape Dental Injury: Teeth and Oropharynx as per pre-operative assessment

## 2023-07-20 ENCOUNTER — Other Ambulatory Visit: Payer: Medicare Other

## 2023-07-20 ENCOUNTER — Ambulatory Visit: Payer: Medicare Other

## 2023-07-20 ENCOUNTER — Encounter (HOSPITAL_COMMUNITY): Payer: Self-pay | Admitting: General Surgery

## 2023-07-20 ENCOUNTER — Ambulatory Visit: Payer: Medicare Other | Admitting: Hematology and Oncology

## 2023-07-20 DIAGNOSIS — C562 Malignant neoplasm of left ovary: Secondary | ICD-10-CM

## 2023-07-20 DIAGNOSIS — Z515 Encounter for palliative care: Secondary | ICD-10-CM | POA: Diagnosis not present

## 2023-07-20 DIAGNOSIS — C7960 Secondary malignant neoplasm of unspecified ovary: Secondary | ICD-10-CM | POA: Diagnosis not present

## 2023-07-20 DIAGNOSIS — K56609 Unspecified intestinal obstruction, unspecified as to partial versus complete obstruction: Secondary | ICD-10-CM | POA: Diagnosis not present

## 2023-07-20 LAB — COMPREHENSIVE METABOLIC PANEL
ALT: 16 U/L (ref 0–44)
AST: 23 U/L (ref 15–41)
Albumin: 2.7 g/dL — ABNORMAL LOW (ref 3.5–5.0)
Alkaline Phosphatase: 87 U/L (ref 38–126)
Anion gap: 10 (ref 5–15)
BUN: 11 mg/dL (ref 8–23)
CO2: 24 mmol/L (ref 22–32)
Calcium: 8.1 mg/dL — ABNORMAL LOW (ref 8.9–10.3)
Chloride: 106 mmol/L (ref 98–111)
Creatinine, Ser: 0.51 mg/dL (ref 0.44–1.00)
GFR, Estimated: >60 mL/min (ref >60)
Glucose, Bld: 112 mg/dL — ABNORMAL HIGH (ref 70–99)
Potassium: 3.3 mmol/L — ABNORMAL LOW (ref 3.5–5.1)
Sodium: 140 mmol/L (ref 135–145)
Total Bilirubin: 1.1 mg/dL (ref 0.0–1.2)
Total Protein: 5.8 g/dL — ABNORMAL LOW (ref 6.5–8.1)

## 2023-07-20 LAB — CBC
HCT: 30.1 % — ABNORMAL LOW (ref 36.0–46.0)
Hemoglobin: 9.9 g/dL — ABNORMAL LOW (ref 12.0–15.0)
MCH: 31.8 pg (ref 26.0–34.0)
MCHC: 32.9 g/dL (ref 30.0–36.0)
MCV: 96.8 fL (ref 80.0–100.0)
Platelets: 253 10*3/uL (ref 150–400)
RBC: 3.11 MIL/uL — ABNORMAL LOW (ref 3.87–5.11)
RDW: 13.9 % (ref 11.5–15.5)
WBC: 6.6 10*3/uL (ref 4.0–10.5)
nRBC: 0 % (ref 0.0–0.2)

## 2023-07-20 LAB — MAGNESIUM: Magnesium: 2 mg/dL (ref 1.7–2.4)

## 2023-07-20 MED ORDER — BOOST / RESOURCE BREEZE PO LIQD CUSTOM
1.0000 | Freq: Three times a day (TID) | ORAL | Status: DC
  Administered 2023-07-20 – 2023-07-23 (×5): 1 via ORAL
  Administered 2023-07-24: 237 mL via ORAL

## 2023-07-20 MED ORDER — POTASSIUM CHLORIDE 10 MEQ/100ML IV SOLN
10.0000 meq | INTRAVENOUS | Status: AC
Start: 2023-07-20 — End: 2023-07-20
  Administered 2023-07-20 (×5): 10 meq via INTRAVENOUS
  Filled 2023-07-20 (×5): qty 100

## 2023-07-20 NOTE — Consult Note (Addendum)
 WOC Nurse ostomy follow up Pt had colostomy surgery performed yesterday.  Friend is at the bedside for pouch change and teaching session. Previously placed flat pouch is leaking behind the barrier so I will change to a convex wafer.  Pt prefers to use a 2 piece pouching system.  Stoma is red and viable, 1 3/4 inches, flush with skin level, large red rubber rod in place.  50cc liquid brown stool emptied from pouch.  Pt was able to stretch and apply the barrier to the back and apply the pouch with assistance, using a hand held mirror. Discussed that the rod will make it difficult to maintain a seal as long as it is in place.  She was able to open and close the velcro to empty.  Reviewed pouching routines and ordering supplies.  Educational materials left at the bedside, along with 5 sets of each supply for staff nurses' use. Use Supplies: barrier rings, [employer] # [id number], convex wafer [employer] [phone], Gigi Gin # [phone] Enrolled patient in [place] Secure Start Discharge program: Yes; today Pt could benefit from home health assistance after discharge, as well as a referral to the outpatient ostomy clinic.  Thank-you,  Cammie Mcgee MSN, RN, CWOCN, [place], CNS [phone]

## 2023-07-20 NOTE — Progress Notes (Signed)
 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  Has tried several laxatives and stool softeners without improvement.  CT done in the ER shows severe diffuse colonic dilatation with complete obstruction at the level of the distal sigmoid colon due to compression from adjacent malignancy.  Peritoneal implant measuring 11.5 x 9.1 cm.  She is followed by Dr. [name]. Seen by GYN oncology Dr. [name].  Outpatient oncology records noted. She has had total abdominal hysterectomy and bilateral salpingo-oophorectomy omentectomy radical tumor debulking by Dr. Andrey Farmer in 2020.  Seen by general surgery, per their recommendations, she underwent exploratory laparotomy diverting loop colostomy 07/19/2023.  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  Seen by general surgery, per them, colonic stent is not an option, they recommended diverting colostomy.  Patient was reluctant initially but after talking to Dr. [name], patient agreed and underwent exploratory laparotomy with diverting loop colostomy on 07/19/2023.  General surgery and oncology following.  Management per them.     Hypokalemia/hypomagnesemia: Ordered both of these yesterday but perhaps she never got those.  Potassium is low again.  Will replenish again.   anemia of chronic disease hemoglobin stable.   GERD: Continue Protonix   anxiety takes Xanax at home but she is n.p.o. so she is on IV as needed  Ativan here.   moderate protein calorie malnutrition due to acute illness: Will need TPN started soon.  Will defer to general surgery.  DVT prophylaxis: enoxaparin (LOVENOX) injection 40 mg Start: 07/20/23 1200   Code Status: Full Code  Family Communication: None present at bedside.  Plan of care discussed with patient in length and he/she verbalized understanding and agreed with it.  Status is: Inpatient Remains inpatient appropriate because: Will be discharged when cleared by oncology and general surgery   Estimated body mass index is 26.34 kg/m as calculated from the following:   Height as of this encounter: 5\' 2"  (1.575 m).   Weight as of this encounter: 65.3 kg.    Nutritional Assessment: Body mass index is 26.34 kg/m.[name] Kitchen Seen by dietician.  I agree with the assessment and plan as outlined below: Nutrition Status: Nutrition Problem: Moderate Malnutrition Etiology: acute illness, constipation Signs/Symptoms: mild fat depletion, energy intake < 75% for > 7 days Interventions: Refer to RD note for recommendations  . Skin Assessment: I have examined the patient's skin and I agree with the wound assessment as performed by the wound care RN as outlined below:    Consultants:  GYN oncology, general oncology, general surgery  Procedures:  As above  Antimicrobials:  Anti-infectives (From admission, onward)    Start     Dose/Rate Route Frequency Ordered Stop   07/19/23 1400  ceFAZolin (ANCEF) IVPB 2g/100 mL premix        2 g 200 mL/hr over 30 Minutes Intravenous  Once 07/19/23 1357 07/19/23 1350   07/19/23 1322  ceFAZolin (ANCEF) 2-4 GM/100ML-% IVPB       Note to Pharmacy:  Maricela Curet J: cabinet override      07/19/23 1322 07/19/23 1400         Subjective: Patient seen and examined.  Sitting in the chair about to take her morning meds.  No complaints other than expected abdominal pain which is mild.  Objective: Vitals:   07/19/23 1550 07/19/23 2037 07/20/23 0141  07/20/23 0502  BP:  (!) 144/69 (!) 141/73 (!) 156/77  Pulse:  82 69 67  Resp: 19 16 18 18   Temp:  (!) 97.4 F (36.3 C) 97.8 F (36.6 C) 97.7 F (36.5 C)  TempSrc:  Oral Oral Oral  SpO2: 100% 94% 94% 95%  Weight:      Height:        Intake/Output Summary (Last 24 hours) at 07/20/2023 0735 Last data filed at 07/20/2023 0539 Gross per 24 hour  Intake 1576.06 ml  Output 1835 ml  Net -258.94 ml   Filed Weights   07/16/23 1001 07/19/23 1316  Weight: 65.3 kg 65.3 kg    Examination:  General exam: Appears calm and comfortable  Respiratory system: Clear to auscultation. Respiratory effort normal. Cardiovascular system: S1 & S2 heard, RRR. No JVD, murmurs, rubs, gallops or clicks. No pedal edema. Gastrointestinal system: Abdomen is nondistended, soft and moderate generalized tenderness, colostomy bag present, no organomegaly or masses felt. Normal bowel sounds heard. Central nervous system: Alert and oriented. No focal neurological deficits. Extremities: Symmetric 5 x 5 power. Skin: No rashes, lesions or ulcers.  Psychiatry: Judgement and insight appear normal. Mood & affect appropriate.   Data Reviewed: I have personally reviewed following labs and imaging studies  CBC: Recent Labs  Lab 07/16/23 1128 07/17/23 0411 07/18/23 1015 07/19/23 1106 07/20/23 0444  WBC 5.3 5.7 5.1 5.2 6.6  NEUTROABS 3.5  --   --  3.5  --   HGB 12.1 10.5* 10.0* 9.8* 9.9*  HCT 37.2 32.4* 31.0* 30.2* 30.1*  MCV 96.6 97.6 98.4 99.3 96.8  PLT 300 260 262 242 253   Basic Metabolic Panel: Recent Labs  Lab 07/16/23 1128 07/17/23 0411 07/18/23 1015 07/19/23 1106 07/20/23 0444  NA 138 139 145 149* 140  K 3.1* 3.6 3.4* 3.2* 3.3*  CL 98 106 113* 115* 106  CO2 23 24 23 23 24   GLUCOSE 109* 152* 129* 106* 112*  BUN 16 14 8  7* 11  CREATININE 0.69 0.47 0.47 0.53 0.51  CALCIUM 9.3 8.1* 8.2* 8.6* 8.1*  MG 1.9  --  1.8 1.7  --   PHOS 4.1  --   --   --   --    GFR: Estimated Creatinine Clearance:  58.9 mL/min (by C-G formula based on SCr of 0.51 mg/dL). Liver Function Tests: Recent Labs  Lab 07/16/23 1128 07/17/23 0411 07/18/23 1015 07/20/23 0444  AST 27 21 20 23   ALT 22 18 16 16   ALKPHOS 156* 124 111 87  BILITOT 1.3* 0.4 0.6 1.1  PROT 6.7 5.8* 5.9* 5.8*  ALBUMIN 3.3* 2.8* 2.7* 2.7*   No results for input(s): "LIPASE", "AMYLASE" in the last 168 hours. No results for input(s): "AMMONIA" in the last 168 hours. Coagulation Profile: No results for input(s): "INR", "PROTIME" in the last 168 hours. Cardiac Enzymes: No results for input(s): "CKTOTAL", "CKMB", "CKMBINDEX", "TROPONINI" in the last 168 hours. BNP (last 3 results) No results for input(s): "PROBNP" in the last 8760 hours. HbA1C: No results for input(s): "HGBA1C" in the last 72 hours. CBG: No results for input(s): "GLUCAP" in the last 168 hours. Lipid  Profile: No results for input(s): "CHOL", "HDL", "LDLCALC", "TRIG", "CHOLHDL", "LDLDIRECT" in the last 72 hours. Thyroid Function Tests: No results for input(s): "TSH", "T4TOTAL", "FREET4", "T3FREE", "THYROIDAB" in the last 72 hours. Anemia Panel: No results for input(s): "VITAMINB12", "FOLATE", "FERRITIN", "TIBC", "IRON", "RETICCTPCT" in the last 72 hours. Sepsis Labs: No results for input(s): "PROCALCITON", "LATICACIDVEN" in the last 168 hours.  No results found for this or any previous visit (from the past 240 hours).   Radiology Studies: No results found.  Scheduled Meds:  acetaminophen  650 mg Oral Q6H   Or   acetaminophen  650 mg Rectal Q6H   enoxaparin (LOVENOX) injection  40 mg Subcutaneous Q24H   methocarbamol (ROBAXIN) injection  500 mg Intravenous Q8H   ondansetron  4 mg Oral Q6H   Or   ondansetron (ZOFRAN) IV  4 mg Intravenous Q6H   pantoprazole (PROTONIX) IV  40 mg Intravenous Q24H   sodium chloride flush  3 mL Intravenous Q12H   Continuous Infusions:  potassium chloride       LOS: 4 days   Hughie Closs, MD [facility]  07/20/2023, 7:35 AM   *Please note that this is a verbal dictation therefore any spelling or grammatical errors are due to the "Dragon [employer] One" system interpretation.  Please page via Amion and do not message via secure chat for urgent patient care matters. Secure chat can be used for non urgent patient care matters.  How to contact the [facility] Attending or Consulting provider 7A - 7P or covering provider during after hours 7P -7A, for this patient?  Check the care team in [facility] and look for a) attending/consulting [facility] provider listed and b) the [facility] team listed. Page or secure chat 7A-7P. Log into [email or web address] and use 's universal password to access. If you do not have the password, please contact the hospital operator. Locate the [facility] provider you are looking for under [facility] and page to a number that you can be directly reached. If you still have difficulty reaching the provider, please page the [facility] (Director on Call) for the Hospitalists listed on amion for assistance.

## 2023-07-20 NOTE — Consult Note (Addendum)
 Consultation Note Date: 07/20/2023   Patient Name: Stacy Rivas  DOB: Mar 25, 1956  MRN: [id number]  Age / Sex: 68 y.o., female  PCP: Geoffry Paradise, MD Referring Physician: Hughie Closs, MD  Reason for Consultation:  progressive ovarian cancer, Malignant bowel obstruction, would like to discuss options prior to proceeding with possible ostomy  HPI/Patient Profile: 68 y.o. female  with past medical history of IBS, GERD, ovarian cancer with peritoneal carcinamatosis- has had progression and plans to start a new treatment, however, now admitted on 07/16/2023 with malignant bowel obstruction. Palliative originally consulted to discuss surgery, however, she consented to procedure before Palliative consult could be completed. Underwent exploratory lap with diverting colostomy on 2/26. I saw her today for further discussion re: Advanced Care Planning.     Primary Decision Maker PATIENT - Andrey Campanile would like her sister- Clydie Braun- to be her surrogate decision maker if she were unable to make decisions for herself- she has an HCPOA document at home- it is not on her chart  Discussion: Chart reviewed including labs, progress notes, imaging from this and previous encounters.  Introduced Palliative medicine and reason for consult.  On evaluation patient was sitting up in her chair. Her sister is at bedside. She gave permission to discuss her case in front of her sister.  She reports her pain is well controlled and is very complementary of the care she has received.  She notes that she understands her treatment is Palliative, but her overall goal is to add time to her life.  She became tearful and noted she was very tired and it may not be the best time to discuss further.  She answered that she would want her sister to be her HCPOA if needed- she has documents but they are at her home. She is hopeful to discharge home and  start her new treatment when Dr. [name] feels she has had adequate time to heal for her surgery.  We discussed referral to Palliative provider, Ethlyn Daniels, NP at [facility] and she is in agreement.  Spiritual support was offered and she was in agreement.    SUMMARY OF RECOMMENDATIONS -Continue current plan of care -Will refer for outpatient followup with Palliative NP at [facility]    Code Status/Advance Care Planning:   Code Status: Full Code    Prognosis:   Unable to determine  Discharge Planning: Home with Palliative Services  Primary Diagnoses: Present on Admission:  Large bowel obstruction (HCC)  Anxiety state  GERD  Left ovarian epithelial cancer (HCC)   Review of Systems  Physical Exam Vitals and nursing note reviewed.  Neurological:     Mental Status: She is alert and oriented to person, place, and time.  Psychiatric:        Mood and Affect: Mood normal.        Behavior: Behavior normal.        Thought Content: Thought content normal.        Judgment: Judgment normal.     Vital Signs: BP (!) 156/77 (BP  Location: Left Arm)   Pulse 67   Temp 97.7 F (36.5 C) (Oral)   Resp 18   Ht 5\' 2"  (1.575 m)   Wt 65.3 kg   SpO2 95%   BMI 26.34 kg/m  Pain Scale: 0-10 POSS *See Group Information*: 1-Acceptable,Awake and alert Pain Score: 3    SpO2: SpO2: 95 % O2 Device:SpO2: 95 % O2 Flow Rate: .O2 Flow Rate (L/min): 3 L/min  IO: Intake/output summary:  Intake/Output Summary (Last 24 hours) at 07/20/2023 1149 Last data filed at 07/20/2023 1030 Gross per 24 hour  Intake 1780.51 ml  Output 2085 ml  Net -304.49 ml    LBM: Last BM Date : 07/20/23 Baseline Weight: Weight: 65.3 kg Most recent weight: Weight: 65.3 kg       Thank you for this consult. Palliative medicine will continue to follow and assist as needed.  Time Total: 60 minutes Signed by: Ocie Bob, AGNP-C Palliative Medicine  Time includes:   Preparing to see the patient (e.g., review of  tests) Obtaining and/or reviewing separately obtained history Performing a medically necessary appropriate examination and/or evaluation Counseling and educating the patient/family/caregiver Ordering medications, tests, or procedures Referring and communicating with other health care professionals (when not reported separately) Documenting clinical information in the electronic or other health record Independently interpreting results (not reported separately) and communicating results to the patient/family/caregiver Care coordination (not reported separately) Clinical documentation   Please contact Palliative Medicine Team phone at [phone] for questions and concerns.  For individual provider: See Loretha Stapler

## 2023-07-20 NOTE — Progress Notes (Signed)
 1 Day Post-Op   Subjective/Chief Complaint: Complains of soreness   Objective: Vital signs in last 24 hours: Temp:  [97.4 F (36.3 C)-97.8 F (36.6 C)] 97.7 F (36.5 C) (02/27 0502) Pulse Rate:  [67-85] 67 (02/27 0502) Resp:  [16-25] 18 (02/27 0502) BP: (140-156)/(69-85) 156/77 (02/27 0502) SpO2:  [92 %-100 %] 95 % (02/27 0502) Weight:  [65.3 kg] 65.3 kg (02/26 1316) Last BM Date : 07/06/23  Intake/Output from previous day: 02/26 0701 - 02/27 0700 In: 1576.1 [I.V.:1200; IV Piggyback:376.1] Out: 1835 [Urine:1450; Emesis/NG output:285; Blood:100] Intake/Output this shift: No intake/output data recorded.  General appearance: alert and cooperative Resp: clear to auscultation bilaterally Cardio: regular rate and rhythm GI: soft, moderate tenderness. Ostomy pink with air and stool in bag  Lab Results:  Recent Labs    07/19/23 1106 07/20/23 0444  WBC 5.2 6.6  HGB 9.8* 9.9*  HCT 30.2* 30.1*  PLT 242 253   BMET Recent Labs    07/19/23 1106 07/20/23 0444  NA 149* 140  K 3.2* 3.3*  CL 115* 106  CO2 23 24  GLUCOSE 106* 112*  BUN 7* 11  CREATININE 0.53 0.51  CALCIUM 8.6* 8.1*   PT/INR No results for input(s): "LABPROT", "INR" in the last 72 hours. ABG No results for input(s): "PHART", "HCO3" in the last 72 hours.  Invalid input(s): "PCO2", "PO2"  Studies/Results: No results found.  Anti-infectives: Anti-infectives (From admission, onward)    Start     Dose/Rate Route Frequency Ordered Stop   07/19/23 1400  ceFAZolin (ANCEF) IVPB 2g/100 mL premix        2 g 200 mL/hr over 30 Minutes Intravenous  Once 07/19/23 1357 07/19/23 1350   07/19/23 1322  ceFAZolin (ANCEF) 2-4 GM/100ML-% IVPB       Note to Pharmacy: Maricela Curet J: cabinet override      07/19/23 1322 07/19/23 1400       Assessment/Plan: s/p Procedure(s): EXPLORATORY LAPAROTOMY, DIVERTING LOOP COLOSTOMY (N/A) Advance diet. Allow clears D/c ng Ambulate POD 1 Ovarian cancer per oncology   LOS: 4 days    Chevis Pretty III 07/20/2023

## 2023-07-20 NOTE — Progress Notes (Signed)
 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 The patient knows to call the clinic with any problems, questions or concerns.   The total time spent in the appointment was 40 minutes encounter with patients including review of chart and various tests results, discussions about plan of care and coordination of care plan  Artis Delay, MD 07/20/2023 3:38 PM  Subjective:  Surgical notes were reviewed.  The patient tolerated NG tube removal.  Colostomy bag appears to be functioning She has minimum pain She has started ambulating  Objective:  Vitals:   07/20/23 0502 07/20/23 1215  BP: (!) 156/77 (!) 147/78  Pulse: 67 88  Resp: 18 18  Temp: 97.7 F (36.5 C) 97.8 F (36.6 C)  SpO2: 95% 98%     Intake/Output Summary (Last 24 hours) at 07/20/2023 1538 Last data filed at 07/20/2023 1425 Gross per 24 hour  Intake 426.91 ml  Output 1885 ml  Net -1458.09 ml    GENERAL:alert, no distress and comfortable.  NG tube has been removed Colostomy site appears to be functioning well   Labs:  Recent Labs    07/17/23 0411 07/18/23 1015 07/19/23 1106 07/20/23 0444  NA 139 145 149*  140  K 3.6 3.4* 3.2* 3.3*  CL 106 113* 115* 106  CO2 24 23 23 24   GLUCOSE 152* 129* 106* 112*  BUN 14 8 7* 11  CREATININE 0.47 0.47 0.53 0.51  CALCIUM 8.1* 8.2* 8.6* 8.1*  GFRNONAA >60 >60 >60 >60  PROT 5.8* 5.9*  --  5.8*  ALBUMIN 2.8* 2.7*  --  2.7*  AST 21 20  --  23  ALT 18 16  --  16  ALKPHOS 124 111  --  87  BILITOT 0.4 0.6  --  1.1    Studies:  DG Abd 1 View Result Date: 07/16/2023 CLINICAL DATA:  Abdominal pain EXAM: ABDOMEN - 1 VIEW COMPARISON:  Abdominal radiographs 07/16/2023 FINDINGS: NG tube in stomach.  Side port below the GE junction. Mild improvement in gaseous distention of colon. For example transverse colon measures 5.7 cm compared to 6.1. Cecum remains distended. No dilated loops of small bowel. IMPRESSION: 1. NG tube in stomach. 2. Some improvement in gaseous distention of the colon. Electronically Signed   By: Genevive Bi M.D.   On: 07/16/2023 17:48   CT ABDOMEN PELVIS WO CONTRAST Result Date: 07/16/2023 CLINICAL DATA:  Generalized abdominal pain for 4 days. History of ovarian cancer. EXAM: CT ABDOMEN AND PELVIS WITHOUT CONTRAST TECHNIQUE: Multidetector CT imaging of the abdomen and pelvis was performed following the standard protocol without IV contrast. RADIATION DOSE REDUCTION: This exam was performed according to the departmental  dose-optimization program which includes automated exposure control, adjustment of the mA and/or kV according to patient size and/or use of iterative reconstruction technique. COMPARISON:  July 07, 2023 FINDINGS: Lower chest: No acute abnormality. Hepatobiliary: No focal liver abnormality is seen. No gallstones, gallbladder wall thickening, or biliary dilatation. Pancreas: Unremarkable. No pancreatic ductal dilatation or surrounding inflammatory changes. Spleen: Normal in size without focal abnormality. Adrenals/Urinary Tract: Adrenal glands appear normal. Nonobstructive left nephrolithiasis is noted. Mild bilateral  hydroureteronephrosis is noted without obstructing calculus. This is concerning for distal ureteral obstruction. Urinary bladder is decompressed. Stomach/Bowel: Stomach and appendix are unremarkable. No significant small bowel dilatation is noted. However, there is severe colonic dilatation which appears to be due to distal sigmoid colon obstruction, most likely due to external compression from adjacent peritoneal implant or malignancy. Vascular/Lymphatic: Aortic atherosclerosis. No significant adenopathy is noted currently. Reproductive: Status post hysterectomy. 5.3 x 3.0 cm left adnexal cystic lesion is noted which is not significantly changed compared to prior exam. 11.5 x 9.1 cm complex cystic abnormality is noted in the pelvis with large peripheral solid components. This is concerning for cystic ovarian malignancy or metastatic disease or possibly loculated ascites and adjacent peritoneal carcinomatosis. Other: No definite hernia is noted. Musculoskeletal: No acute or significant osseous findings. IMPRESSION: Interval development of severe diffuse colonic dilatation with complete obstruction at the level of the distal sigmoid colon, most likely due to external compression from adjacent malignancy or peritoneal implant given history of ovarian carcinoma. There is noted 11.5 x 9.1 cm complex cystic abnormality in the pelvis with large peripheral solid components which is concerning for cystic ovarian malignancy or metastatic disease, or possibly loculated ascites with adjacent peritoneal carcinomatosis. Nonobstructive left nephrolithiasis is noted. Mild bilateral hydroureteronephrosis is noted without obstructing calculus, concerning for distal ureteral obstruction secondary to neoplasm. Aortic Atherosclerosis (ICD10-I70.0). Electronically Signed   By: Lupita Raider M.D.   On: 07/16/2023 11:53   DG Abdomen 1 View Result Date: 07/16/2023 CLINICAL DATA:  Abdominal pain, constipation. EXAM: ABDOMEN - 1 VIEW  COMPARISON:  June 07, 2018. FINDINGS: No definite small bowel dilatation is noted. Moderately dilated and air-filled colon is noted most consistent with ileus. Or clips are noted in the pelvis. IMPRESSION: Moderately dilated and air-filled colon is noted concerning for ileus. Electronically Signed   By: Lupita Raider M.D.   On: 07/16/2023 10:56   CT CHEST ABDOMEN PELVIS W CONTRAST Result Date: 07/10/2023 CLINICAL DATA:  History of recurrent ovarian cancer, follow-up. * Tracking Code: BO * EXAM: CT CHEST, ABDOMEN, AND PELVIS WITH CONTRAST TECHNIQUE: Multidetector CT imaging of the chest, abdomen and pelvis was performed following the standard protocol during bolus administration of intravenous contrast. RADIATION DOSE REDUCTION: This exam was performed according to the departmental dose-optimization program which includes automated exposure control, adjustment of the mA and/or kV according to patient size and/or use of iterative reconstruction technique. CONTRAST:  OMNIPAQUE IOHEXOL 300 MG/ML  SOLN COMPARISON:  Multiple priors including CT April 10, 2023 FINDINGS: CT CHEST FINDINGS Cardiovascular: Right chest Port-A-Cath with tip near the superior cavoatrial junction. Normal caliber thoracic aorta. Normal size heart. Mediastinum/Nodes: No suspicious thyroid nodule. No pathologically enlarged mediastinal, hilar or axillary lymph nodes. Esophagus is grossly unremarkable. Lungs/Pleura: Biapical pleuroparenchymal scarring. 4 mm left lower lobe pulmonary nodule on image 62/6 stable from coronary CT Oct 06, 2021 suggestive of a benign finding. No new suspicious pulmonary nodules or masses. Musculoskeletal: No aggressive lytic or blastic lesion of bone. CT ABDOMEN PELVIS FINDINGS  Hepatobiliary: New heterogeneous 2.3 cm subcapsular segment VI hepatic lesion on image 57/2 and 14 mm segment VI/VII hepatic lesion on image 55/2. Gallbladder is unremarkable.  No biliary ductal dilation. Pancreas: No pancreatic  ductal dilation or evidence of acute inflammation. Spleen: No splenomegaly. Adrenals/Urinary Tract: No suspicious adrenal nodule/mass. Hydronephrosis. Nonobstructive left renal stones measure up to 6 mm. Kidneys demonstrate symmetric enhancement. Mild symmetric wall thickening of an incompletely distended urinary bladder. Stomach/Bowel: Stomach is unremarkable for degree of distension. No pathologic dilation of small or large bowel. No evidence of acute bowel inflammation. Vascular/Lymphatic: Normal caliber abdominal aorta. Aortic atherosclerosis. Smooth IVC contours. The portal, splenic and superior mesenteric veins are patent. Increased size of the low-density left external iliac lymph node now measuring 14 mm in short axis on image 93/2 previously 7 mm. Reproductive: Increased size of the heterogeneous lesion in the arising from the right vaginal cuff now measuring 4.9 x 3.4 cm on image 105/2 previously 2.5 x 1.9 cm. Left ovarian cystic lesion with persistent enhancing soft tissue along the margin of the lesion for instance on image 101/2 now in total measures 5.3 x 5.1 cm on image 108/2 previously 4.2 x 4.2 cm. Other: Increased abdominopelvic free fluid with a new loculated collection in the pelvis. Few new scattered soft tissue nodules in the omentum/peritoneum for instance along the right pericolic gutter measuring 7 mm on image 93/2 and in the left upper quadrant measuring 5 mm on image 53/2. Musculoskeletal: No aggressive lytic or blastic lesion of bone. IMPRESSION: 1. Increased size of the heterogeneous lesion arising from the right vaginal cuff, increased size of the left ovarian cystic lesion with persistent enhancing soft tissue along the margin of the lesion, and increased size of the low-density left external iliac lymph node, compatible with worsening disease. 2. Increased abdominopelvic free fluid with a new loculated collection in the pelvis and a few new scattered soft tissue nodules in the  omentum/peritoneum, compatible with peritoneal carcinomatosis. 3. New heterogeneous 2.3 cm subcapsular segment VI hepatic lesion and 14 mm segment VI/VII hepatic lesion, compatible with hepatic metastatic disease. 4. No evidence of metastatic disease in the chest. 5. Nonobstructive left renal stones measure up to 6 mm. 6. Mild symmetric wall thickening of an incompletely distended urinary bladder. Correlate with urinalysis to exclude cystitis. 7.  Aortic Atherosclerosis (ICD10-I70.0). Electronically Signed   By: Maudry Mayhew M.D.   On: 07/10/2023 11:39

## 2023-07-20 NOTE — Plan of Care (Signed)

## 2023-07-20 NOTE — Progress Notes (Signed)
 Mobility Specialist - Progress Note   07/20/23 1430  Mobility  Activity Ambulated with assistance in hallway  Level of Assistance Modified independent, requires aide device or extra time  Assistive Device Other (Comment) (IV Pole)  Distance Ambulated (ft) 350 ft  Activity Response Tolerated well  Mobility Referral Yes  Mobility visit 1 Mobility  Mobility Specialist Start Time (ACUTE ONLY) 1419  Mobility Specialist Stop Time (ACUTE ONLY) 1429  Mobility Specialist Time Calculation (min) (ACUTE ONLY) 10 min   Pt received in recliner and agreeable to mobility. No complaints during session. Pt to bed after session with all needs met.    [facility]

## 2023-07-21 ENCOUNTER — Other Ambulatory Visit: Payer: Self-pay | Admitting: Hematology and Oncology

## 2023-07-21 ENCOUNTER — Other Ambulatory Visit (HOSPITAL_COMMUNITY): Payer: Self-pay

## 2023-07-21 DIAGNOSIS — K56609 Unspecified intestinal obstruction, unspecified as to partial versus complete obstruction: Secondary | ICD-10-CM | POA: Diagnosis not present

## 2023-07-21 LAB — CBC WITH DIFFERENTIAL/PLATELET
Abs Immature Granulocytes: 0.09 10*3/uL — ABNORMAL HIGH (ref 0.00–0.07)
Basophils Absolute: 0 10*3/uL (ref 0.0–0.1)
Basophils Relative: 0 %
Eosinophils Absolute: 0 10*3/uL (ref 0.0–0.5)
Eosinophils Relative: 1 %
HCT: 28.5 % — ABNORMAL LOW (ref 36.0–46.0)
Hemoglobin: 9.4 g/dL — ABNORMAL LOW (ref 12.0–15.0)
Immature Granulocytes: 1 %
Lymphocytes Relative: 20 %
Lymphs Abs: 1.4 10*3/uL (ref 0.7–4.0)
MCH: 31.9 pg (ref 26.0–34.0)
MCHC: 33 g/dL (ref 30.0–36.0)
MCV: 96.6 fL (ref 80.0–100.0)
Monocytes Absolute: 1 10*3/uL (ref 0.1–1.0)
Monocytes Relative: 14 %
Neutro Abs: 4.6 10*3/uL (ref 1.7–7.7)
Neutrophils Relative %: 64 %
Platelets: 265 10*3/uL (ref 150–400)
RBC: 2.95 MIL/uL — ABNORMAL LOW (ref 3.87–5.11)
RDW: 14.1 % (ref 11.5–15.5)
WBC: 7.1 10*3/uL (ref 4.0–10.5)
nRBC: 0 % (ref 0.0–0.2)

## 2023-07-21 LAB — BASIC METABOLIC PANEL
Anion gap: 8 (ref 5–15)
BUN: 13 mg/dL (ref 8–23)
CO2: 26 mmol/L (ref 22–32)
Calcium: 8 mg/dL — ABNORMAL LOW (ref 8.9–10.3)
Chloride: 104 mmol/L (ref 98–111)
Creatinine, Ser: 0.5 mg/dL (ref 0.44–1.00)
GFR, Estimated: >60 mL/min (ref >60)
Glucose, Bld: 87 mg/dL (ref 70–99)
Potassium: 3 mmol/L — ABNORMAL LOW (ref 3.5–5.1)
Sodium: 138 mmol/L (ref 135–145)

## 2023-07-21 MED ORDER — SODIUM CHLORIDE 0.9% FLUSH: 10.0000 mL | INTRAVENOUS | Status: DC | PRN

## 2023-07-21 MED ORDER — POTASSIUM CHLORIDE 20 MEQ PO PACK
40.0000 meq | PACK | ORAL | Status: AC
  Administered 2023-07-21 (×2): 40 meq via ORAL
  Filled 2023-07-21 (×2): qty 2

## 2023-07-21 MED ORDER — CHLORHEXIDINE GLUCONATE CLOTH 2 % EX PADS
6.0000 | MEDICATED_PAD | Freq: Every day | CUTANEOUS | Status: DC
  Administered 2023-07-21 – 2023-07-24 (×4): 6 via TOPICAL

## 2023-07-21 MED ORDER — SODIUM CHLORIDE 0.9% FLUSH
10.0000 mL | Freq: Two times a day (BID) | INTRAVENOUS | Status: DC
  Administered 2023-07-21 – 2023-07-24 (×7): 10 mL

## 2023-07-21 NOTE — Progress Notes (Signed)
 Nutrition Follow-up  DOCUMENTATION CODES:   Non-severe (moderate) malnutrition in context of acute illness/injury  INTERVENTION:  Pt at high refeed risk; recommend monitor potassium, magnesium and phosphorus labs daily until stable  Boost Breeze po TID, each supplement provides 250 kcal and 9 grams of protein while on clear liquid diet  Encouraged continued intake of clear liquids to assess tolerance so diet can be advanced  Discussed types of foods on clear liquid diet that are more nutrient dense to help pt meet calorie and protein needs.  Offer Ensure Enlive TID when diet advances and assess tolerance  NUTRITION DIAGNOSIS:  Moderate Malnutrition related to acute illness, constipation as evidenced by mild fat depletion, energy intake < 75% for > 7 days.  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    CT done in the ER shows severe diffuse colonic dilatation with complete obstruction at the level of the distal sigmoid colon due to compression from adjacent malignancy.  She has been seen by general surgery recommending a diverting colostomy  2/23 NG tube placed for suction 2/26 laparotomy diversion colostomy 2/27 NG tube removed  Continues to be followed by oncology and mobility specialist. Continued repletion of potassium due to hypokalemia.   Spoke with pt who was awake and alert. Daughter and Husband at bedside during discussion. Pt reports continued poor appetite due to pain. Pt reports she only drank tea and juice for breakfast and will drink the Boost Breeze around lunch. Pt reports mostly  drinking tea and juice yesterday as well. Pt did eat most of the lemon fruit ice for dinner last night, but has not eaten any of the broths brought on her tray. Discussed ordering more options on tray and to try jellos for lunch, pt agreeable. Pt reports issues with GERD when drinking certain juices, discussed choosing less acidic juices and eating enough to advance diet so we can advance supplements to Ensure Enlive. Pt reports trying Ensure/milk-based ONS previously and is agreeable to those once diet advances.  Encouraged continued intake of clear liquids to assess tolerance so diet can be advanced  Discussed types of foods on clear liquid diet that are more nutrient dense to help pt meet calorie and protein needs.  Medications reviewed and include: lovenox, protonix, potassium chloride, Boost Breeze TID  Labs reviewed: Potassium 3.0, Hgb 9.4  Diet Order:   Diet Order             Diet clear liquid Room service appropriate? Yes; Fluid consistency: Thin  Diet effective now                  EDUCATION NEEDS:  Education needs have been addressed  Skin:  Skin Assessment: Reviewed RN Assessment  Last BM:  2/27 type 7  Height:  Ht Readings from Last 1 Encounters:  07/19/23 5\' 2"  (1.575 m)   Weight:  Wt Readings from Last 1 Encounters:  07/19/23 65.3 kg   BMI:  Body mass index is 26.34 kg/m.  Estimated Nutritional Needs:   Kcal:  1900-2100  Protein:  85-100g  Fluid:  1.9L/day  Louis Meckel Dietetic Intern

## 2023-07-21 NOTE — Progress Notes (Signed)
 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  Has tried several laxatives and stool softeners without improvement.  CT done in the ER shows severe diffuse colonic dilatation with complete obstruction at the level of the distal sigmoid colon due to compression from adjacent malignancy.  Peritoneal implant measuring 11.5 x 9.1 cm.  She is followed by Dr. [name]. Seen by GYN oncology Dr. [name].  Outpatient oncology records noted. She has had total abdominal hysterectomy and bilateral salpingo-oophorectomy omentectomy radical tumor debulking by Dr. Andrey Farmer in 2020.  Seen by general surgery, per their recommendations, she underwent exploratory laparotomy diverting loop colostomy 07/19/2023.  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  Seen by general surgery, per them, colonic stent is not an option, they recommended diverting colostomy.  Patient was reluctant initially but after talking to Dr. [name], patient agreed and underwent exploratory laparotomy with diverting loop colostomy on 07/19/2023.  Patient on clear liquid diet and tolerating.  General surgery and oncology following.  Management per them.     Hypokalemia/hypomagnesemia: Potassium low again.  Will replenish.    anemia of chronic disease hemoglobin stable.   GERD: Continue Protonix   anxiety takes Xanax at home but she is n.p.o. so she is on IV as needed Ativan here.   moderate  protein calorie malnutrition due to acute illness: Will need TPN started soon.  Will defer to general surgery.  GOC: Palliative care following.  DVT prophylaxis: enoxaparin (LOVENOX) injection 40 mg Start: 07/20/23 1200   Code Status: Full Code  Family Communication: None present at bedside.  Plan of care discussed with patient in length and he/she verbalized understanding and agreed with it.  Status is: Inpatient Remains inpatient appropriate because: Will be discharged when cleared by oncology and general surgery   Estimated body mass index is 26.34 kg/m as calculated from the following:   Height as of this encounter: 5\' 2"  (1.575 m).   Weight as of this encounter: 65.3 kg.    Nutritional Assessment: Body mass index is 26.34 kg/m.[name] Kitchen Seen by dietician.  I agree with the assessment and plan as outlined below: Nutrition Status: Nutrition Problem: Moderate Malnutrition Etiology: acute illness, constipation Signs/Symptoms: mild fat depletion, energy intake < 75% for > 7 days Interventions: Refer to RD note for recommendations  . Skin Assessment: I have examined the patient's skin and I agree with the wound assessment as performed by the wound care RN as outlined below:    Consultants:  GYN oncology, general oncology, general surgery  Procedures:  As above  Antimicrobials:  Anti-infectives (From admission, onward)    Start     Dose/Rate Route Frequency Ordered Stop   07/19/23 1400  ceFAZolin (ANCEF) IVPB 2g/100 mL premix        2 g 200 mL/hr over 30 Minutes Intravenous  Once 07/19/23 1357 07/19/23 1350   07/19/23 1322  ceFAZolin (ANCEF) 2-4 GM/100ML-% IVPB       Note to Pharmacy:  Maricela Curet J: cabinet override      07/19/23 1322 07/19/23 1400         Subjective: Patient seen and examined.  Abdominal pain improving.  Tolerating clear liquid diet.  No other complaint.  Objective: Vitals:   07/20/23 0502 07/20/23 1215 07/20/23 1938 07/21/23 0440  BP: (!) 156/77  (!) 147/78 121/75 (!) 147/81  Pulse: 67 88 80 79  Resp: 18 18 17 17   Temp: 97.7 F (36.5 C) 97.8 F (36.6 C) 98.1 F (36.7 C) 98 F (36.7 C)  TempSrc: Oral Oral    SpO2: 95% 98% 99% 96%  Weight:      Height:        Intake/Output Summary (Last 24 hours) at 07/21/2023 0807 Last data filed at 07/20/2023 2025 Gross per 24 hour  Intake 840.85 ml  Output 475 ml  Net 365.85 ml   Filed Weights   07/16/23 1001 07/19/23 1316  Weight: 65.3 kg 65.3 kg    Examination:  General exam: Appears calm and comfortable  Respiratory system: Clear to auscultation. Respiratory effort normal. Cardiovascular system: S1 & S2 heard, RRR. No JVD, murmurs, rubs, gallops or clicks. No pedal edema. Gastrointestinal system: Abdomen is nondistended, soft and moderate generalized abdominal tenderness with colostomy bag present filled with stool. No organomegaly or masses felt. Normal bowel sounds heard. Central nervous system: Alert and oriented. No focal neurological deficits. Extremities: Symmetric 5 x 5 power. Skin: No rashes, lesions or ulcers.  Psychiatry: Judgement and insight appear normal. Mood & affect appropriate.   Data Reviewed: I have personally reviewed following labs and imaging studies  CBC: Recent Labs  Lab 07/16/23 1128 07/17/23 0411 07/18/23 1015 07/19/23 1106 07/20/23 0444 07/21/23 0530  WBC 5.3 5.7 5.1 5.2 6.6 7.1  NEUTROABS 3.5  --   --  3.5  --  4.6  HGB 12.1 10.5* 10.0* 9.8* 9.9* 9.4*  HCT 37.2 32.4* 31.0* 30.2* 30.1* 28.5*  MCV 96.6 97.6 98.4 99.3 96.8 96.6  PLT 300 260 262 242 253 265   Basic Metabolic Panel: Recent Labs  Lab 07/16/23 1128 07/17/23 0411 07/18/23 1015 07/19/23 1106 07/20/23 0441 07/20/23 0444 07/21/23 0530  NA 138 139 145 149*  --  140 138  K 3.1* 3.6 3.4* 3.2*  --  3.3* 3.0*  CL 98 106 113* 115*  --  106 104  CO2 23 24 23 23   --  24 26  GLUCOSE 109* 152* 129* 106*  --  112* 87  BUN 16 14 8  7*  --  11 13  CREATININE 0.69 0.47 0.47 0.53  --   0.51 0.50  CALCIUM 9.3 8.1* 8.2* 8.6*  --  8.1* 8.0*  MG 1.9  --  1.8 1.7 2.0  --   --   PHOS 4.1  --   --   --   --   --   --    GFR: Estimated Creatinine Clearance: 58.9 mL/min (by C-G formula based on SCr of 0.5 mg/dL). Liver Function Tests: Recent Labs  Lab 07/16/23 1128 07/17/23 0411 07/18/23 1015 07/20/23 0444  AST 27 21 20 23   ALT 22 18 16 16   ALKPHOS 156* 124 111 87  BILITOT 1.3* 0.4 0.6 1.1  PROT 6.7 5.8* 5.9* 5.8*  ALBUMIN 3.3* 2.8* 2.7* 2.7*   No results for input(s): "LIPASE", "AMYLASE" in the last 168 hours. No results for input(s): "AMMONIA" in the last 168 hours. Coagulation Profile: No results for input(s): "INR", "PROTIME" in the last 168 hours.  Cardiac Enzymes: No results for input(s): "CKTOTAL", "CKMB", "CKMBINDEX", "TROPONINI" in the last 168 hours. BNP (last 3 results) No results for input(s): "PROBNP" in the last 8760 hours. HbA1C: No results for input(s): "HGBA1C" in the last 72 hours. CBG: No results for input(s): "GLUCAP" in the last 168 hours. Lipid Profile: No results for input(s): "CHOL", "HDL", "LDLCALC", "TRIG", "CHOLHDL", "LDLDIRECT" in the last 72 hours. Thyroid Function Tests: No results for input(s): "TSH", "T4TOTAL", "FREET4", "T3FREE", "THYROIDAB" in the last 72 hours. Anemia Panel: No results for input(s): "VITAMINB12", "FOLATE", "FERRITIN", "TIBC", "IRON", "RETICCTPCT" in the last 72 hours. Sepsis Labs: No results for input(s): "PROCALCITON", "LATICACIDVEN" in the last 168 hours.  No results found for this or any previous visit (from the past 240 hours).   Radiology Studies: No results found.  Scheduled Meds:  acetaminophen  650 mg Oral Q6H   Or   acetaminophen  650 mg Rectal Q6H   Chlorhexidine Gluconate Cloth  6 each Topical Daily   enoxaparin (LOVENOX) injection  40 mg Subcutaneous Q24H   feeding supplement  1 Container Oral TID BM   methocarbamol (ROBAXIN) injection  500 mg Intravenous Q8H   ondansetron  4 mg Oral Q6H    Or   ondansetron (ZOFRAN) IV  4 mg Intravenous Q6H   pantoprazole (PROTONIX) IV  40 mg Intravenous Q24H   potassium chloride  40 mEq Oral Q4H   sodium chloride flush  10-40 mL Intracatheter Q12H   sodium chloride flush  3 mL Intravenous Q12H   Continuous Infusions:     LOS: 5 days   Hughie Closs, MD [facility]  07/21/2023, 8:07 AM   *Please note that this is a verbal dictation therefore any spelling or grammatical errors are due to the "Dragon [employer] One" system interpretation.  Please page via Amion and do not message via secure chat for urgent patient care matters. Secure chat can be used for non urgent patient care matters.  How to contact the [facility] Attending or Consulting provider 7A - 7P or covering provider during after hours 7P -7A, for this patient?  Check the care team in [facility] and look for a) attending/consulting [facility] provider listed and b) the [facility] team listed. Page or secure chat 7A-7P. Log into [email or web address] and use Jette's universal password to access. If you do not have the password, please contact the hospital operator. Locate the [facility] provider you are looking for under [facility] and page to a number that you can be directly reached. If you still have difficulty reaching the provider, please page the [facility] (Director on Call) for the Hospitalists listed on amion for assistance.

## 2023-07-21 NOTE — Plan of Care (Signed)
 Pt educated on ostomy care. Pt verbalized understanding and demonstrated with teach back. Potassium levels are low and are being supplemented with PO meds. Continue to manage abd pain with meds. Problem: Education: Goal: Knowledge of General Education information will improve Description: Including pain rating scale, medication(s)/side effects and non-pharmacologic comfort measures Outcome: Progressing   Problem: Health Behavior/Discharge Planning: Goal: Ability to manage health-related needs will improve Outcome: Progressing   Problem: Clinical Measurements: Goal: Ability to maintain clinical measurements within normal limits will improve Outcome: Progressing Goal: Will remain free from infection Outcome: Progressing Goal: Diagnostic test results will improve Outcome: Progressing Goal: Respiratory complications will improve Outcome: Progressing Goal: Cardiovascular complication will be avoided Outcome: Progressing   Problem: Activity: Goal: Risk for activity intolerance will decrease Outcome: Progressing   Problem: Nutrition: Goal: Adequate nutrition will be maintained Outcome: Progressing   Problem: Coping: Goal: Level of anxiety will decrease Outcome: Progressing   Problem: Elimination: Goal: Will not experience complications related to bowel motility Outcome: Progressing Goal: Will not experience complications related to urinary retention Outcome: Progressing   Problem: Pain Managment: Goal: General experience of comfort will improve and/or be controlled Outcome: Progressing   Problem: Safety: Goal: Ability to remain free from injury will improve Outcome: Progressing   Problem: Skin Integrity: Goal: Risk for impaired skin integrity will decrease Outcome: Progressing

## 2023-07-21 NOTE — Progress Notes (Signed)
 2 Days Post-Op   Subjective/Chief Complaint: Complains of abd tenderness   Objective: Vital signs in last 24 hours: Temp:  [97.8 F (36.6 C)-98.1 F (36.7 C)] 98 F (36.7 C) (02/28 0440) Pulse Rate:  [79-88] 79 (02/28 0440) Resp:  [17-18] 17 (02/28 0440) BP: (121-147)/(75-81) 147/81 (02/28 0440) SpO2:  [96 %-99 %] 96 % (02/28 0440) Last BM Date : 07/20/23  Intake/Output from previous day: 02/27 0701 - 02/28 0700 In: 840.9 [P.O.:360; IV Piggyback:480.9] Out: 675 [Urine:450; Stool:225] Intake/Output this shift: No intake/output data recorded.  General appearance: alert and cooperative Resp: clear to auscultation bilaterally Cardio: regular rate and rhythm GI: soft. Moderate tenderness. Ostomy pink with air and stool in bag  Lab Results:  Recent Labs    07/20/23 0444 07/21/23 0530  WBC 6.6 7.1  HGB 9.9* 9.4*  HCT 30.1* 28.5*  PLT 253 265   BMET Recent Labs    07/20/23 0444 07/21/23 0530  NA 140 138  K 3.3* 3.0*  CL 106 104  CO2 24 26  GLUCOSE 112* 87  BUN 11 13  CREATININE 0.51 0.50  CALCIUM 8.1* 8.0*   PT/INR No results for input(s): "LABPROT", "INR" in the last 72 hours. ABG No results for input(s): "PHART", "HCO3" in the last 72 hours.  Invalid input(s): "PCO2", "PO2"  Studies/Results: No results found.  Anti-infectives: Anti-infectives (From admission, onward)    Start     Dose/Rate Route Frequency Ordered Stop   07/19/23 1400  ceFAZolin (ANCEF) IVPB 2g/100 mL premix        2 g 200 mL/hr over 30 Minutes Intravenous  Once 07/19/23 1357 07/19/23 1350   07/19/23 1322  ceFAZolin (ANCEF) 2-4 GM/100ML-% IVPB       Note to Pharmacy: Maricela Curet J: cabinet override      07/19/23 1322 07/19/23 1400       Assessment/Plan: s/p Procedure(s): EXPLORATORY LAPAROTOMY, DIVERTING LOOP COLOSTOMY (N/A) Advance diet Ambulate POD 2 loop colostomy Ovarian cancer per oncology  LOS: 5 days    Chevis Pretty III 07/21/2023

## 2023-07-21 NOTE — Progress Notes (Signed)
   07/21/23 1225  Spiritual Encounters  Type of Visit Initial  Care provided to: Patient  Referral source Other (comment) (Spiritual Consult)  Reason for visit Routine spiritual support  OnCall Visit No  Spiritual Framework  Presenting Themes Significant life change;Goals in life/care;Values and beliefs  Values/beliefs God fearing, trust and confidence in God  Community/Connection Family;Friend(s);Spiritual leader;Faith community  Strengths Realization that what has worked in the past may not be ok this time.  Needs/Challenges/Barriers Reassurance  Patient Stress Factors Health changes  Family Stress Factors None identified   Chaplain responded to a spiritual consult for conversation. The patient Noelly, welcomed me into her space and shared her concerns. Her life review revealed a person who was active and when she can still is. One who walks a journey well lived and wants those she cares about to know how loved they are. Shyane explored a bit what she might do different as she goes forward. What is most important to her and  how she invests her time an energy. She expressed her concerns and hopes and as we prayed we included those hopes.I wished her a peaceful evening and continued recovery as I departed.   Valerie Roys [facility]  [phone]

## 2023-07-21 NOTE — Progress Notes (Signed)
 Mobility Specialist - Progress Note   07/21/23 1028  Mobility  Activity Ambulated with assistance in hallway;Ambulated independently to bathroom  Level of Assistance Independent  Assistive Device None;Other (Comment) (IV Pole)  Distance Ambulated (ft) 350 ft  Activity Response Tolerated well  Mobility Referral Yes  Mobility visit 1 Mobility  Mobility Specialist Start Time (ACUTE ONLY) 1005  Mobility Specialist Stop Time (ACUTE ONLY) 1027  Mobility Specialist Time Calculation (min) (ACUTE ONLY) 22 min   Pt received in bed and agreeable to mobility. Prior to ambulating, pt requested assistance to the bathroom. No complaints during session. Pt to bed after session with all needs met.    [facility]

## 2023-07-21 NOTE — Consult Note (Addendum)
 WOC Nurse ostomy follow up Current pouch was changed yesterday; intact with good seal. Stoma is red and viable when visualized through the pouch with red rubber rod. Mod amt liquid brown stool in the pouch.  Reviewed pouching routines and ordering supplies.  Educational materials at the bedside, along with 5 sets of each supply for staff nurses' use. Use Supplies: barrier rings, Hart [place] # [id number], convex wafer [employer] [place] # [id number], Gigi Gin # [id number] Enrolled patient in [facility] Discharge program: Yes; previously.  Pt feels very overwhelmed and discouraged related to recent surgery and her diagnosis.  Spent time reassuring her and providing supportive listening.   WOC team is not available on Sat and Sun and will perform another pouch change and teaching session on Mon.  Placed on Secure Start: yes, previously Pt could benefit from home health assistance after discharge, as well as a referral to the outpatient ostomy clinic.  Thank-you,  Cammie Mcgee MSN, RN, CWOCN, [place], CNS [phone]

## 2023-07-22 DIAGNOSIS — Z933 Colostomy status: Secondary | ICD-10-CM

## 2023-07-22 DIAGNOSIS — K56609 Unspecified intestinal obstruction, unspecified as to partial versus complete obstruction: Secondary | ICD-10-CM | POA: Diagnosis not present

## 2023-07-22 DIAGNOSIS — Z433 Encounter for attention to colostomy: Secondary | ICD-10-CM

## 2023-07-22 LAB — CBC WITH DIFFERENTIAL/PLATELET
Abs Immature Granulocytes: 0.11 10*3/uL — ABNORMAL HIGH (ref 0.00–0.07)
Basophils Absolute: 0 10*3/uL (ref 0.0–0.1)
Basophils Relative: 0 %
Eosinophils Absolute: 0.1 10*3/uL (ref 0.0–0.5)
Eosinophils Relative: 1 %
HCT: 29 % — ABNORMAL LOW (ref 36.0–46.0)
Hemoglobin: 9.2 g/dL — ABNORMAL LOW (ref 12.0–15.0)
Immature Granulocytes: 2 %
Lymphocytes Relative: 21 %
Lymphs Abs: 1.4 10*3/uL (ref 0.7–4.0)
MCH: 31.5 pg (ref 26.0–34.0)
MCHC: 31.7 g/dL (ref 30.0–36.0)
MCV: 99.3 fL (ref 80.0–100.0)
Monocytes Absolute: 1 10*3/uL (ref 0.1–1.0)
Monocytes Relative: 15 %
Neutro Abs: 4.1 10*3/uL (ref 1.7–7.7)
Neutrophils Relative %: 61 %
Platelets: 237 10*3/uL (ref 150–400)
RBC: 2.92 MIL/uL — ABNORMAL LOW (ref 3.87–5.11)
RDW: 13.8 % (ref 11.5–15.5)
WBC: 6.7 10*3/uL (ref 4.0–10.5)
nRBC: 0 % (ref 0.0–0.2)

## 2023-07-22 LAB — BASIC METABOLIC PANEL
Anion gap: 8 (ref 5–15)
BUN: 7 mg/dL — ABNORMAL LOW (ref 8–23)
CO2: 25 mmol/L (ref 22–32)
Calcium: 7.8 mg/dL — ABNORMAL LOW (ref 8.9–10.3)
Chloride: 102 mmol/L (ref 98–111)
Creatinine, Ser: 0.4 mg/dL — ABNORMAL LOW (ref 0.44–1.00)
GFR, Estimated: >60 mL/min (ref >60)
Glucose, Bld: 100 mg/dL — ABNORMAL HIGH (ref 70–99)
Potassium: 3 mmol/L — ABNORMAL LOW (ref 3.5–5.1)
Sodium: 135 mmol/L (ref 135–145)

## 2023-07-22 LAB — PHOSPHORUS: Phosphorus: 2.7 mg/dL (ref 2.5–4.6)

## 2023-07-22 LAB — MAGNESIUM: Magnesium: 1.3 mg/dL — ABNORMAL LOW (ref 1.7–2.4)

## 2023-07-22 MED ORDER — MAGNESIUM SULFATE 4 GM/100ML IV SOLN
4.0000 g | Freq: Once | INTRAVENOUS | Status: AC
  Administered 2023-07-22: 4 g via INTRAVENOUS
  Filled 2023-07-22: qty 100

## 2023-07-22 MED ORDER — NAPHAZOLINE-GLYCERIN 0.012-0.25 % OP SOLN: 1.0000 [drp] | Freq: Four times a day (QID) | OPHTHALMIC | Status: DC | PRN

## 2023-07-22 MED ORDER — METHOCARBAMOL 1000 MG/10ML IJ SOLN: 1000.0000 mg | Freq: Four times a day (QID) | INTRAMUSCULAR | Status: DC | PRN

## 2023-07-22 MED ORDER — SIMETHICONE 40 MG/0.6ML PO SUSP: 80.0000 mg | Freq: Four times a day (QID) | ORAL | Status: DC | PRN

## 2023-07-22 MED ORDER — MENTHOL 3 MG MT LOZG: 1.0000 | LOZENGE | OROMUCOSAL | Status: DC | PRN

## 2023-07-22 MED ORDER — METHOCARBAMOL 500 MG PO TABS: 1000.0000 mg | ORAL_TABLET | Freq: Four times a day (QID) | ORAL | Status: DC | PRN

## 2023-07-22 MED ORDER — CALCIUM POLYCARBOPHIL 625 MG PO TABS
625.0000 mg | ORAL_TABLET | Freq: Two times a day (BID) | ORAL | Status: DC
  Administered 2023-07-22 – 2023-07-24 (×5): 625 mg via ORAL
  Filled 2023-07-22 (×5): qty 1

## 2023-07-22 MED ORDER — SODIUM CHLORIDE 0.9% FLUSH: 3.0000 mL | INTRAVENOUS | Status: DC | PRN

## 2023-07-22 MED ORDER — POTASSIUM CHLORIDE CRYS ER 20 MEQ PO TBCR
40.0000 meq | EXTENDED_RELEASE_TABLET | ORAL | Status: AC
  Administered 2023-07-22 (×3): 40 meq via ORAL
  Filled 2023-07-22 (×3): qty 2

## 2023-07-22 MED ORDER — LACTATED RINGERS IV BOLUS: 1000.0000 mL | Freq: Three times a day (TID) | INTRAVENOUS | Status: AC | PRN

## 2023-07-22 MED ORDER — SALINE SPRAY 0.65 % NA SOLN: 1.0000 | Freq: Four times a day (QID) | NASAL | Status: DC | PRN

## 2023-07-22 MED ORDER — ALUM & MAG HYDROXIDE-SIMETH 200-200-20 MG/5ML PO SUSP: 30.0000 mL | Freq: Four times a day (QID) | ORAL | Status: DC | PRN

## 2023-07-22 MED ORDER — SODIUM CHLORIDE 0.9% FLUSH
3.0000 mL | Freq: Two times a day (BID) | INTRAVENOUS | Status: DC
  Administered 2023-07-22 – 2023-07-24 (×4): 3 mL via INTRAVENOUS

## 2023-07-22 MED ORDER — PANTOPRAZOLE SODIUM 40 MG PO TBEC
40.0000 mg | DELAYED_RELEASE_TABLET | Freq: Every day | ORAL | Status: DC
  Administered 2023-07-22: 40 mg via ORAL
  Filled 2023-07-22: qty 1

## 2023-07-22 MED ORDER — ACETAMINOPHEN 500 MG PO TABS
1000.0000 mg | ORAL_TABLET | Freq: Four times a day (QID) | ORAL | Status: DC
  Administered 2023-07-22 – 2023-07-24 (×8): 1000 mg via ORAL
  Filled 2023-07-22 (×9): qty 2

## 2023-07-22 MED ORDER — MAGIC MOUTHWASH: 15.0000 mL | Freq: Four times a day (QID) | ORAL | Status: DC | PRN

## 2023-07-22 MED ORDER — SODIUM CHLORIDE 0.9 % IV SOLN: 250.0000 mL | INTRAVENOUS | Status: DC | PRN

## 2023-07-22 NOTE — Progress Notes (Signed)
 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  Has tried several laxatives and stool softeners without improvement.  CT done in the ER shows severe diffuse colonic dilatation with complete obstruction at the level of the distal sigmoid colon due to compression from adjacent malignancy.  Peritoneal implant measuring 11.5 x 9.1 cm.  She is followed by Dr. [name]. Seen by GYN oncology Dr. [name].  Outpatient oncology records noted. She has had total abdominal hysterectomy and bilateral salpingo-oophorectomy omentectomy radical tumor debulking by Dr. Andrey Farmer in 2020.  Seen by general surgery, per their recommendations, she underwent exploratory laparotomy diverting loop colostomy 07/19/2023.  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  Seen by general surgery, per them, colonic stent is not an option, they recommended diverting colostomy.  Patient was reluctant initially but after talking to Dr. [name], patient agreed and underwent exploratory laparotomy with diverting loop colostomy on 07/19/2023.  Tolerating full liquid diet which was started yesterday.  Diet advancement per general surgery.  General surgery and oncology following.  Management per them.     Hypokalemia/hypomagnesemia: Potassium low again.  Will replenish again today.    anemia of chronic disease hemoglobin stable.   GERD: Continue Protonix   anxiety takes Xanax at home but she  is n.p.o. so she is on IV as needed Ativan here.   moderate protein calorie malnutrition due to acute illness: Will need TPN started soon.  Will defer to general surgery.  GOC: Palliative care following.  DVT prophylaxis: enoxaparin (LOVENOX) injection 40 mg Start: 07/20/23 1200   Code Status: Full Code  Family Communication: None present at bedside.  Plan of care discussed with patient in length and he/she verbalized understanding and agreed with it.  Status is: Inpatient Remains inpatient appropriate because: Will be discharged when cleared by oncology and general surgery   Estimated body mass index is 26.34 kg/m as calculated from the following:   Height as of this encounter: 5\' 2"  (1.575 m).   Weight as of this encounter: 65.3 kg.    Nutritional Assessment: Body mass index is 26.34 kg/m.[name] Kitchen Seen by dietician.  I agree with the assessment and plan as outlined below: Nutrition Status: Nutrition Problem: Moderate Malnutrition Etiology: acute illness, constipation Signs/Symptoms: mild fat depletion, energy intake < 75% for > 7 days Interventions: Refer to RD note for recommendations  . Skin Assessment: I have examined the patient's skin and I agree with the wound assessment as performed by the wound care RN as outlined below:    Consultants:  GYN oncology, general oncology, general surgery  Procedures:  As above  Antimicrobials:  Anti-infectives (From admission, onward)    Start     Dose/Rate Route Frequency Ordered Stop   07/19/23 1400  ceFAZolin (ANCEF) IVPB 2g/100 mL premix        2 g 200 mL/hr over 30 Minutes Intravenous  Once 07/19/23 1357 07/19/23 1350   07/19/23 1322  ceFAZolin (ANCEF) 2-4 GM/100ML-% IVPB  Note to Pharmacy: Maricela Curet J: cabinet override      07/19/23 1322 07/19/23 1400         Subjective: Seen and examined.  Patient was walking in the halls when I went to see her.  Daughter was with her.  We went to the room.  Patient said that  she is feeling better.  Still has abdominal pain but improving.  She is tolerating full liquid diet.  No other complaint.  Objective: Vitals:   07/21/23 0440 07/21/23 1137 07/21/23 2038 07/22/23 0617  BP: (!) 147/81 (!) 165/87 (!) 146/79 (!) 142/88  Pulse: 79 78 75 68  Resp: 17 18 17 17   Temp: 98 F (36.7 C) 97.9 F (36.6 C) 97.8 F (36.6 C) 98 F (36.7 C)  TempSrc:  Oral Oral Oral  SpO2: 96% 98% 95% 95%  Weight:      Height:        Intake/Output Summary (Last 24 hours) at 07/22/2023 0800 Last data filed at 07/21/2023 1700 Gross per 24 hour  Intake 373 ml  Output --  Net 373 ml   Filed Weights   07/16/23 1001 07/19/23 1316  Weight: 65.3 kg 65.3 kg    Examination:  General exam: Appears calm and comfortable  Respiratory system: Clear to auscultation. Respiratory effort normal. Cardiovascular system: S1 & S2 heard, RRR. No JVD, murmurs, rubs, gallops or clicks. No pedal edema. Gastrointestinal system: Abdomen is nondistended, soft and moderate generalized abdominal tenderness with colostomy bag filled with stool. No organomegaly or masses felt. Normal bowel sounds heard. Central nervous system: Alert and oriented. No focal neurological deficits. Extremities: Symmetric 5 x 5 power. Skin: No rashes, lesions or ulcers.  Psychiatry: Judgement and insight appear normal. Mood & affect appropriate.    Data Reviewed: I have personally reviewed following labs and imaging studies  CBC: Recent Labs  Lab 07/16/23 1128 07/17/23 0411 07/18/23 1015 07/19/23 1106 07/20/23 0444 07/21/23 0530 07/22/23 0309  WBC 5.3   < > 5.1 5.2 6.6 7.1 6.7  NEUTROABS 3.5  --   --  3.5  --  4.6 4.1  HGB 12.1   < > 10.0* 9.8* 9.9* 9.4* 9.2*  HCT 37.2   < > 31.0* 30.2* 30.1* 28.5* 29.0*  MCV 96.6   < > 98.4 99.3 96.8 96.6 99.3  PLT 300   < > 262 242 253 265 237   < > = values in this interval not displayed.   Basic Metabolic Panel: Recent Labs  Lab 07/16/23 1128 07/17/23 0411 07/18/23 1015  07/19/23 1106 07/20/23 0441 07/20/23 0444 07/21/23 0530 07/22/23 0309  NA 138   < > 145 149*  --  140 138 135  K 3.1*   < > 3.4* 3.2*  --  3.3* 3.0* 3.0*  CL 98   < > 113* 115*  --  106 104 102  CO2 23   < > 23 23  --  24 26 25   GLUCOSE 109*   < > 129* 106*  --  112* 87 100*  BUN 16   < > 8 7*  --  11 13 7*  CREATININE 0.69   < > 0.47 0.53  --  0.51 0.50 0.40*  CALCIUM 9.3   < > 8.2* 8.6*  --  8.1* 8.0* 7.8*  MG 1.9  --  1.8 1.7 2.0  --   --   --   PHOS 4.1  --   --   --   --   --   --   --    < > =  values in this interval not displayed.   GFR: Estimated Creatinine Clearance: 58.9 mL/min (A) (by C-G formula based on SCr of 0.4 mg/dL (L)). Liver Function Tests: Recent Labs  Lab 07/16/23 1128 07/17/23 0411 07/18/23 1015 07/20/23 0444  AST 27 21 20 23   ALT 22 18 16 16   ALKPHOS 156* 124 111 87  BILITOT 1.3* 0.4 0.6 1.1  PROT 6.7 5.8* 5.9* 5.8*  ALBUMIN 3.3* 2.8* 2.7* 2.7*   No results for input(s): "LIPASE", "AMYLASE" in the last 168 hours. No results for input(s): "AMMONIA" in the last 168 hours. Coagulation Profile: No results for input(s): "INR", "PROTIME" in the last 168 hours. Cardiac Enzymes: No results for input(s): "CKTOTAL", "CKMB", "CKMBINDEX", "TROPONINI" in the last 168 hours. BNP (last 3 results) No results for input(s): "PROBNP" in the last 8760 hours. HbA1C: No results for input(s): "HGBA1C" in the last 72 hours. CBG: No results for input(s): "GLUCAP" in the last 168 hours. Lipid Profile: No results for input(s): "CHOL", "HDL", "LDLCALC", "TRIG", "CHOLHDL", "LDLDIRECT" in the last 72 hours. Thyroid Function Tests: No results for input(s): "TSH", "T4TOTAL", "FREET4", "T3FREE", "THYROIDAB" in the last 72 hours. Anemia Panel: No results for input(s): "VITAMINB12", "FOLATE", "FERRITIN", "TIBC", "IRON", "RETICCTPCT" in the last 72 hours. Sepsis Labs: No results for input(s): "PROCALCITON", "LATICACIDVEN" in the last 168 hours.  No results found for this or  any previous visit (from the past 240 hours).   Radiology Studies: No results found.  Scheduled Meds:  acetaminophen  650 mg Oral Q6H   Or   acetaminophen  650 mg Rectal Q6H   Chlorhexidine Gluconate Cloth  6 each Topical Daily   enoxaparin (LOVENOX) injection  40 mg Subcutaneous Q24H   feeding supplement  1 Container Oral TID BM   methocarbamol (ROBAXIN) injection  500 mg Intravenous Q8H   ondansetron  4 mg Oral Q6H   Or   ondansetron (ZOFRAN) IV  4 mg Intravenous Q6H   pantoprazole (PROTONIX) IV  40 mg Intravenous Q24H   sodium chloride flush  10-40 mL Intracatheter Q12H   sodium chloride flush  3 mL Intravenous Q12H   Continuous Infusions:     LOS: 6 days   Hughie Closs, MD [facility]  07/22/2023, 8:00 AM   *Please note that this is a verbal dictation therefore any spelling or grammatical errors are due to the "Dragon Medical One" system interpretation.  Please page via Amion and do not message via secure chat for urgent patient care matters. Secure chat can be used for non urgent patient care matters.  How to contact the [facility] Attending or Consulting provider 7A - 7P or covering provider during after hours 7P -7A, for this patient?  Check the care team in [facility] and look for a) attending/consulting [facility] provider listed and b) the [facility] team listed. Page or secure chat 7A-7P. Log into [email or web address] and use [employer]'s universal password to access. If you do not have the password, please contact the hospital operator. Locate the [facility] provider you are looking for under [facility] and page to a number that you can be directly reached. If you still have difficulty reaching the provider, please page the [facility] (Director on Call) for the Hospitalists listed on amion for assistance.

## 2023-07-22 NOTE — Plan of Care (Signed)

## 2023-07-22 NOTE — Progress Notes (Signed)
 07/22/2023  Stacy Rivas [id number] 10/06/1955  CARE TEAM: PCP: Geoffry Paradise, MD  Outpatient Care Team: Patient Care Team: Geoffry Paradise, MD as PCP - General (Internal Medicine) Leanora Ivanoff, RN as Registered Nurse  Inpatient Treatment Team: Treatment Team:  Hughie Closs, MD [facility], Md, MD Leanna Battles, RN Doylene Bode, NP Delmer Islam, MD Delfina Redwood, NT Erlenmeyer, Raymon Mutton, RN Littles, Femi Dory Peru, RN Carmine Savoy, RN   Problem List:   Principal Problem:   Large bowel obstruction [facility]) Active Problems:   Anxiety state   GERD   Left ovarian epithelial cancer [facility])   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  Would benefit from outpatient ostomy clinic  Try to improve pain control.  She likes heating pad.  Make Tylenol scheduled.  Encouraged her to do those things before asking for stronger medications but use them as needed  -VTE prophylaxis- SCDs, etc  -mobilize as tolerated to help recovery  -Disposition: Hopefully able to discharge in the next day or so if continues to improve.       I reviewed nursing notes, hospitalist notes, last 24 h vitals and pain scores, last 48 h intake and output, last 24 h labs and trends, and last 24 h imaging results.  I have reviewed this patient's available data, including medical history, events of note, test results, etc as part of my evaluation.   A significant portion of that time was spent in counseling. Care during the  described time interval was provided by me.  This care required moderate level of medical decision making.  07/22/2023    Subjective: (Chief complaint)  Patient still is feeling better.  Friend at bedside.  No nausea or vomiting.  On clear liquids.  Trying to walk more.  Less crampy.  Objective:  Vital signs:  Vitals:   07/21/23 0440 07/21/23 1137 07/21/23 2038 07/22/23 0617  BP: (!) 147/81 (!) 165/87 (!) 146/79 (!) 142/88  Pulse: 79 78 75 68  Resp: 17 18 17 17   Temp: 98 F (36.7 C) 97.9 F (36.6 C) 97.8 F (36.6 C) 98 F (36.7 C)  TempSrc:  Oral Oral Oral  SpO2: 96% 98% 95% 95%  Weight:      Height:        Last BM Date : 07/21/23  Intake/Output   Yesterday:  02/28 0701 - 03/01 0700 In: 373 [P.O.:360; I.V.:13] Out: -  This shift:  Total I/O In: 240 [P.O.:240] Out: -   Bowel function:  Flatus: YES  BM:  YES  Drain: (No drain)   Physical Exam:  General: Pt awake/alert in no acute distress Eyes: PERRL, normal EOM.  Sclera clear.  No icterus Neuro: CN II-XII intact w/o focal sensory/motor deficits. Lymph: No head/neck/groin lymphadenopathy Psych:  No delerium/psychosis/paranoia.  Oriented x 4 HENT: Normocephalic, Mucus membranes moist.  No thrush Neck: Supple, No tracheal deviation.  No obvious thyromegaly Chest: No pain to chest wall compression.  Good respiratory excursion.  No audible wheezing CV:  Pulses intact.  Regular rhythm.  No major extremity edema MS: Normal AROM mjr joints.  No obvious deformity  Abdomen: Soft.  Mildy distended.  Midline dressing with old blood but otherwise clean.  Left upper quadrant colostomy with gas and stool in bag.  Pink..  No evidence of peritonitis.  No incarcerated hernias.  Ext:   No deformity.  No mjr edema.  No cyanosis Skin: No petechiae / purpurea.  No major sores.  Warm and dry    Results:   Cultures: No results found for this or any previous visit (from the past 720 hours).  Labs: Results for  orders placed or performed during the hospital encounter of 07/16/23 (from the past 48 hours)  CBC with Differential/Platelet     Status: Abnormal   Collection Time: 07/21/23  5:30 AM  Result Value Ref Range   WBC 7.1 4.0 - 10.5 K/uL   RBC 2.95 (L) 3.87 - 5.11 MIL/uL   Hemoglobin 9.4 (L) 12.0 - 15.0 g/dL   HCT 16.1 (L) 09.6 - 04.5 %   MCV 96.6 80.0 - 100.0 fL   MCH 31.9 26.0 - 34.0 pg   MCHC 33.0 30.0 - 36.0 g/dL   RDW 40.9 81.1 - 91.4 %   Platelets 265 150 - 400 K/uL   nRBC 0.0 0.0 - 0.2 %   Neutrophils Relative % 64 %   Neutro Abs 4.6 1.7 - 7.7 K/uL   Lymphocytes Relative 20 %   Lymphs Abs 1.4 0.7 - 4.0 K/uL   Monocytes Relative 14 %   Monocytes Absolute 1.0 0.1 - 1.0 K/uL   Eosinophils Relative 1 %   Eosinophils Absolute 0.0 0.0 - 0.5 K/uL   Basophils Relative 0 %   Basophils Absolute 0.0 0.0 - 0.1 K/uL   Immature Granulocytes 1 %   Abs Immature Granulocytes 0.09 (H) 0.00 - 0.07 K/uL    Comment: Performed at [facility], [address] [place], [place] [address]  Basic metabolic panel     Status: Abnormal   Collection Time: 07/21/23  5:30 AM  Result Value Ref Range   Sodium 138 135 - 145 mmol/L   Potassium 3.0 (L) 3.5 - 5.1 mmol/L   Chloride 104 98 - 111 mmol/L   CO2 26 22 - 32 mmol/L   Glucose, Bld 87 70 - 99 mg/dL    Comment: Glucose reference range applies only to samples taken after fasting for at least 8 hours.   BUN 13 8 - 23 mg/dL   Creatinine, Ser 6.21 0.44 - 1.00 mg/dL   Calcium 8.0 (L) 8.9 - 10.3 mg/dL   GFR, Estimated >30 >86 mL/min    Comment: (NOTE) Calculated using the CKD-EPI Creatinine Equation (2021)    Anion gap 8 5 - 15    Comment: Performed at [facility], [address], [place], [place] [address]  CBC with Differential/Platelet     Status: Abnormal   Collection Time: 07/22/23  3:09 AM  Result Value Ref Range   WBC 6.7 4.0 - 10.5 K/uL   RBC 2.92 (L) 3.87 - 5.11 MIL/uL   Hemoglobin 9.2 (L) 12.0 - 15.0  g/dL   HCT 96.2 (L) 95.2 - 84.1 %   MCV 99.3 80.0 - 100.0 fL   MCH 31.5 26.0 - 34.0 pg   MCHC 31.7 30.0 - 36.0 g/dL   RDW 32.4 40.1 - 02.7 %   Platelets 237 150 - 400 K/uL   nRBC 0.0 0.0 - 0.2 %  Neutrophils Relative % 61 %   Neutro Abs 4.1 1.7 - 7.7 K/uL   Lymphocytes Relative 21 %   Lymphs Abs 1.4 0.7 - 4.0 K/uL   Monocytes Relative 15 %   Monocytes Absolute 1.0 0.1 - 1.0 K/uL   Eosinophils Relative 1 %   Eosinophils Absolute 0.1 0.0 - 0.5 K/uL   Basophils Relative 0 %   Basophils Absolute 0.0 0.0 - 0.1 K/uL   Immature Granulocytes 2 %   Abs Immature Granulocytes 0.11 (H) 0.00 - 0.07 K/uL    Comment: Performed at [facility], [address], [place], [place] [address]  Basic metabolic panel     Status: Abnormal   Collection Time: 07/22/23  3:09 AM  Result Value Ref Range   Sodium 135 135 - 145 mmol/L   Potassium 3.0 (L) 3.5 - 5.1 mmol/L   Chloride 102 98 - 111 mmol/L   CO2 25 22 - 32 mmol/L   Glucose, Bld 100 (H) 70 - 99 mg/dL    Comment: Glucose reference range applies only to samples taken after fasting for at least 8 hours.   BUN 7 (L) 8 - 23 mg/dL   Creatinine, Ser 6.04 (L) 0.44 - 1.00 mg/dL   Calcium 7.8 (L) 8.9 - 10.3 mg/dL   GFR, Estimated >54 >09 mL/min    Comment: (NOTE) Calculated using the CKD-EPI Creatinine Equation (2021)    Anion gap 8 5 - 15    Comment: Performed at [facility], [address] [place], [place] [address]    Imaging / Studies: No results found.  Medications / Allergies: per chart  Antibiotics: Anti-infectives (From admission, onward)    Start     Dose/Rate Route Frequency Ordered Stop   07/19/23 1400  ceFAZolin (ANCEF) IVPB 2g/100 mL premix        2 g 200 mL/hr over 30 Minutes Intravenous  Once 07/19/23 1357 07/19/23 1350   07/19/23 1322  ceFAZolin (ANCEF) 2-4 GM/100ML-% IVPB       Note to Pharmacy: Maricela Curet J: cabinet override      07/19/23 1322 07/19/23 1400          Note: Portions of this report may have been transcribed using voice recognition software. Every effort was made to ensure accuracy; however, inadvertent computerized transcription errors may be present.   Any transcriptional errors that result from this process are unintentional.    Ardeth Sportsman, MD, FACS, MASCRS Esophageal, Gastrointestinal & Colorectal Surgery Robotic and Minimally Invasive Surgery  [facility] [address], [address] [place], [place] [address] [phone] Fax [phone] Main  CONTACT INFORMATION: Weekday (9AM-5PM): Call [facility] main office at [phone] Weeknight (5PM-9AM) or Weekend/Holiday: Check EPIC "Web Links" tab & use "AMION" (password " [email or web address]") for General Surgery [facility] coverage  Please, DO NOT use SecureChat  (it is not reliable communication to reach operating surgeons & will lead to a delay in care).   Epic staff messaging available for outptient concerns needing 1-2 business day response.      07/22/2023  8:45 AM

## 2023-07-23 DIAGNOSIS — K56609 Unspecified intestinal obstruction, unspecified as to partial versus complete obstruction: Secondary | ICD-10-CM | POA: Diagnosis not present

## 2023-07-23 LAB — PREALBUMIN: Prealbumin: 7 mg/dL — ABNORMAL LOW (ref 18–38)

## 2023-07-23 LAB — CREATININE, SERUM
Creatinine, Ser: 0.48 mg/dL (ref 0.44–1.00)
GFR, Estimated: >60 mL/min (ref >60)

## 2023-07-23 LAB — PHOSPHORUS: Phosphorus: 2.5 mg/dL (ref 2.5–4.6)

## 2023-07-23 LAB — MAGNESIUM: Magnesium: 1.8 mg/dL (ref 1.7–2.4)

## 2023-07-23 LAB — POTASSIUM: Potassium: 4 mmol/L (ref 3.5–5.1)

## 2023-07-23 MED ORDER — ALPRAZOLAM 0.25 MG PO TABS
0.2500 mg | ORAL_TABLET | Freq: Two times a day (BID) | ORAL | Status: DC | PRN
  Administered 2023-07-24: 0.25 mg via ORAL
  Filled 2023-07-23: qty 1

## 2023-07-23 MED ORDER — FAMOTIDINE 20 MG PO TABS
20.0000 mg | ORAL_TABLET | ORAL | Status: DC | PRN
Start: 2023-07-23 — End: 2023-07-24

## 2023-07-23 MED ORDER — POLYETHYLENE GLYCOL 3350 17 G PO PACK: 17.0000 g | PACK | Freq: Two times a day (BID) | ORAL | 0 refills | Status: DC

## 2023-07-23 MED ORDER — OXYCODONE HCL 5 MG PO TABS
5.0000 mg | ORAL_TABLET | Freq: Four times a day (QID) | ORAL | 0 refills | Status: DC | PRN
Start: 2023-07-23 — End: 2023-08-03

## 2023-07-23 MED ORDER — PANTOPRAZOLE SODIUM 40 MG PO TBEC
40.0000 mg | DELAYED_RELEASE_TABLET | Freq: Every day | ORAL | Status: DC
  Administered 2023-07-23 – 2023-07-24 (×2): 40 mg via ORAL
  Filled 2023-07-23 (×2): qty 1

## 2023-07-23 NOTE — Progress Notes (Signed)
 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  Has tried several laxatives and stool softeners without improvement.  CT done in the ER shows severe diffuse colonic dilatation with complete obstruction at the level of the distal sigmoid colon due to compression from adjacent malignancy.  Peritoneal implant measuring 11.5 x 9.1 cm.  She is followed by Dr. [name]. Seen by GYN oncology Dr. [name].  Outpatient oncology records noted. She has had total abdominal hysterectomy and bilateral salpingo-oophorectomy omentectomy radical tumor debulking by Dr. Andrey Farmer in 2020.  Seen by general surgery, per their recommendations, she underwent exploratory laparotomy diverting loop colostomy 07/19/2023.  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  Seen by general surgery, per them, colonic stent is not an option, they recommended diverting colostomy.  Patient was reluctant initially but after talking to Dr. [name], patient agreed and underwent exploratory laparotomy with diverting loop colostomy on 07/19/2023.  Tolerating soft diet.  Diet advancement per general surgery.  General surgery and oncology following.  Management per them.     Hypokalemia/hypomagnesemia: Labs pending today.   anemia of chronic disease hemoglobin stable.   GERD: Continue Protonix   anxiety takes Xanax at home but she is n.p.o. so she is  on IV as needed Ativan here.   moderate protein calorie malnutrition due to acute illness: Patient tolerating diet.  Encouraged p.o. intake.  GOC: Palliative care following.  DVT prophylaxis: enoxaparin (LOVENOX) injection 40 mg Start: 07/20/23 1200   Code Status: Full Code  Family Communication: None present at bedside.  Plan of care discussed with patient in length and he/she verbalized understanding and agreed with it.  Status is: Inpatient Remains inpatient appropriate because: Will be discharged when cleared by oncology and general surgery, hopeful for tomorrow.   Estimated body mass index is 26.34 kg/m as calculated from the following:   Height as of this encounter: 5\' 2"  (1.575 m).   Weight as of this encounter: 65.3 kg.    Nutritional Assessment: Body mass index is 26.34 kg/m.[name] Kitchen Seen by dietician.  I agree with the assessment and plan as outlined below: Nutrition Status: Nutrition Problem: Moderate Malnutrition Etiology: acute illness, constipation Signs/Symptoms: mild fat depletion, energy intake < 75% for > 7 days Interventions: Refer to RD note for recommendations  . Skin Assessment: I have examined the patient's skin and I agree with the wound assessment as performed by the wound care RN as outlined below:    Consultants:  GYN oncology, general oncology, general surgery  Procedures:  As above  Antimicrobials:  Anti-infectives (From admission, onward)    Start     Dose/Rate Route Frequency Ordered Stop   07/19/23 1400  ceFAZolin (ANCEF) IVPB 2g/100 mL premix        2 g 200 mL/hr over 30 Minutes Intravenous  Once 07/19/23 1357 07/19/23 1350   07/19/23 1322  ceFAZolin (ANCEF) 2-4 GM/100ML-% IVPB  Note to Pharmacy: Maricela Curet J: cabinet override      07/19/23 1322 07/19/23 1400         Subjective: And examined, abdominal pain is improving.  No new complaint.  Tolerating diet.  Objective: Vitals:   07/22/23 0617 07/22/23 1240 07/22/23 2052  07/23/23 0546  BP: (!) 142/88 (!) 153/92 (!) 159/80 (!) 153/70  Pulse: 68 78 72 70  Resp: 17 16 15 16   Temp: 98 F (36.7 C) 97.8 F (36.6 C) 98.3 F (36.8 C) 97.8 F (36.6 C)  TempSrc: Oral Oral    SpO2: 95% 100% 97% 100%  Weight:      Height:        Intake/Output Summary (Last 24 hours) at 07/23/2023 0759 Last data filed at 07/22/2023 2045 Gross per 24 hour  Intake 243 ml  Output 50 ml  Net 193 ml   Filed Weights   07/16/23 1001 07/19/23 1316  Weight: 65.3 kg 65.3 kg    Examination:  General exam: Appears calm and comfortable  Respiratory system: Clear to auscultation. Respiratory effort normal. Cardiovascular system: S1 & S2 heard, RRR. No JVD, murmurs, rubs, gallops or clicks. No pedal edema. Gastrointestinal system: Abdomen is nondistended, soft and mild generalized tenderness, colostomy bag with stool. No organomegaly or masses felt. Normal bowel sounds heard. Central nervous system: Alert and oriented. No focal neurological deficits. Extremities: Symmetric 5 x 5 power. Skin: No rashes, lesions or ulcers.  Psychiatry: Judgement and insight appear normal. Mood & affect appropriate.    Data Reviewed: I have personally reviewed following labs and imaging studies  CBC: Recent Labs  Lab 07/16/23 1128 07/17/23 0411 07/18/23 1015 07/19/23 1106 07/20/23 0444 07/21/23 0530 07/22/23 0309  WBC 5.3   < > 5.1 5.2 6.6 7.1 6.7  NEUTROABS 3.5  --   --  3.5  --  4.6 4.1  HGB 12.1   < > 10.0* 9.8* 9.9* 9.4* 9.2*  HCT 37.2   < > 31.0* 30.2* 30.1* 28.5* 29.0*  MCV 96.6   < > 98.4 99.3 96.8 96.6 99.3  PLT 300   < > 262 242 253 265 237   < > = values in this interval not displayed.   Basic Metabolic Panel: Recent Labs  Lab 07/16/23 1128 07/17/23 0411 07/18/23 1015 07/19/23 1106 07/20/23 0441 07/20/23 0444 07/21/23 0530 07/22/23 0308 07/22/23 0309 07/23/23 0330  NA 138   < > 145 149*  --  140 138  --  135  --   K 3.1*   < > 3.4* 3.2*  --  3.3* 3.0*  --  3.0*  --    CL 98   < > 113* 115*  --  106 104  --  102  --   CO2 23   < > 23 23  --  24 26  --  25  --   GLUCOSE 109*   < > 129* 106*  --  112* 87  --  100*  --   BUN 16   < > 8 7*  --  11 13  --  7*  --   CREATININE 0.69   < > 0.47 0.53  --  0.51 0.50  --  0.40* 0.48  CALCIUM 9.3   < > 8.2* 8.6*  --  8.1* 8.0*  --  7.8*  --   MG 1.9  --  1.8 1.7 2.0  --   --  1.3*  --   --   PHOS 4.1  --   --   --   --   --   --  2.7  --   --    < > = values in this interval not displayed.   GFR: Estimated Creatinine Clearance: 58.9 mL/min (by C-G formula based on SCr of 0.48 mg/dL). Liver Function Tests: Recent Labs  Lab 07/16/23 1128 07/17/23 0411 07/18/23 1015 07/20/23 0444  AST 27 21 20 23   ALT 22 18 16 16   ALKPHOS 156* 124 111 87  BILITOT 1.3* 0.4 0.6 1.1  PROT 6.7 5.8* 5.9* 5.8*  ALBUMIN 3.3* 2.8* 2.7* 2.7*   No results for input(s): "LIPASE", "AMYLASE" in the last 168 hours. No results for input(s): "AMMONIA" in the last 168 hours. Coagulation Profile: No results for input(s): "INR", "PROTIME" in the last 168 hours. Cardiac Enzymes: No results for input(s): "CKTOTAL", "CKMB", "CKMBINDEX", "TROPONINI" in the last 168 hours. BNP (last 3 results) No results for input(s): "PROBNP" in the last 8760 hours. HbA1C: No results for input(s): "HGBA1C" in the last 72 hours. CBG: No results for input(s): "GLUCAP" in the last 168 hours. Lipid Profile: No results for input(s): "CHOL", "HDL", "LDLCALC", "TRIG", "CHOLHDL", "LDLDIRECT" in the last 72 hours. Thyroid Function Tests: No results for input(s): "TSH", "T4TOTAL", "FREET4", "T3FREE", "THYROIDAB" in the last 72 hours. Anemia Panel: No results for input(s): "VITAMINB12", "FOLATE", "FERRITIN", "TIBC", "IRON", "RETICCTPCT" in the last 72 hours. Sepsis Labs: No results for input(s): "PROCALCITON", "LATICACIDVEN" in the last 168 hours.  No results found for this or any previous visit (from the past 240 hours).   Radiology Studies: No results  found.  Scheduled Meds:  acetaminophen  1,000 mg Oral Q6H   Chlorhexidine Gluconate Cloth  6 each Topical Daily   enoxaparin (LOVENOX) injection  40 mg Subcutaneous Q24H   feeding supplement  1 Container Oral TID BM   methocarbamol (ROBAXIN) injection  500 mg Intravenous Q8H   ondansetron  4 mg Oral Q6H   Or   ondansetron (ZOFRAN) IV  4 mg Intravenous Q6H   pantoprazole  40 mg Oral QHS   pantoprazole  40 mg Oral Daily   polycarbophil  625 mg Oral BID   sodium chloride flush  10-40 mL Intracatheter Q12H   sodium chloride flush  3 mL Intravenous Q12H   sodium chloride flush  3 mL Intravenous Q12H   Continuous Infusions:  sodium chloride     lactated ringers        LOS: 7 days   Hughie Closs, MD [facility]  07/23/2023, 7:59 AM   *Please note that this is a verbal dictation therefore any spelling or grammatical errors are due to the "Dragon Medical One" system interpretation.  Please page via Amion and do not message via secure chat for urgent patient care matters. Secure chat can be used for non urgent patient care matters.  How to contact the [facility] Attending or Consulting provider 7A - 7P or covering provider during after hours 7P -7A, for this patient?  Check the care team in [facility] and look for a) attending/consulting [facility] provider listed and b) the [facility] team listed. Page or secure chat 7A-7P. Log into [email or web address] and use [place]'s universal password to access. If you do not have the password, please contact the hospital operator. Locate the [facility] provider you are looking for under [facility] and page to a number that you can be directly reached. If you still have difficulty reaching the provider, please page the [facility] (Director on Call) for the Hospitalists listed on amion for assistance.

## 2023-07-23 NOTE — Progress Notes (Signed)
 07/23/2023  DACI STUBBE [id number] October 27, 1955  CARE TEAM: PCP: Geoffry Paradise, MD  Outpatient Care Team: Patient Care Team: Geoffry Paradise, MD as PCP - General (Internal Medicine) Leanora Ivanoff, RN as Registered Nurse  Inpatient Treatment Team: Treatment Team:  Hughie Closs, MD [facility], Md, MD Leanna Battles, RN Doylene Bode, NP Delmer Islam, MD Erlen[place]yer, Raymon Mutton, RN Redmond Baseman, RN Elmer Picker, RN [place], Essence, NT Leith, [place], RN   Problem List:   Principal Problem:   Large bowel obstruction [facility]) Active Problems:   Anxiety state   GERD   Left ovarian epithelial cancer [facility])   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  Now that she is not obstruction only has transverse colon less likely to need all the Senokot and MiraLAX but we will see  Colostomy care and training.  Would benefit from outpatient ostomy clinic -placed order for that.  Consider home health as well  Pain control.  Tylenol heat.  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  -VTE prophylaxis- SCDs, etc  -mobilize as tolerated to help recovery  -Disposition: Hopefully able to discharge in the next day or so if continues to improve.  Would be wise to come back to our office in about a week for staple removal my office nurse and then follow-up to see Dr. Carolynne Edouard later in the  month.       I reviewed nursing notes, hospitalist notes, last 24 h vitals and pain scores, last 48 h intake and output, last 24 h labs and trends, and last 24 h imaging results.  I have reviewed this patient's available data, including medical history, events of note, test results, etc as part of my evaluation.   A significant portion of that time was spent in counseling. Care during the described time interval was provided by me.  This care required moderate level of medical decision making.  07/23/2023    Subjective: (Chief complaint)  Patient still is feeling better.  Friend at bedside.  No nausea or vomiting.  On clear liquids.  Trying to walk more.  Less crampy.  Objective:  Vital signs:  Vitals:   07/22/23 0617 07/22/23 1240 07/22/23 2052 07/23/23 0546  BP: (!) 142/88 (!) 153/92 (!) 159/80 (!) 153/70  Pulse: 68 78 72 70  Resp: 17 16 15 16   Temp: 98 F (36.7 C) 97.8 F (36.6 C) 98.3 F (36.8 C) 97.8 F (36.6 C)  TempSrc: Oral Oral    SpO2: 95% 100% 97% 100%  Weight:      Height:        Last BM Date : 07/22/23  Intake/Output   Yesterday:  03/01 0701 - 03/02 0700 In: 243 [P.O.:240; I.V.:3] Out: 50 [Stool:50] This shift:  No intake/output data recorded.  Bowel function:  Flatus: YES  BM:  YES  Drain: (No drain)   Physical Exam:  General: Pt awake/alert  in no acute distress Eyes: PERRL, normal EOM.  Sclera clear.  No icterus Neuro: CN II-XII intact w/o focal sensory/motor deficits. Lymph: No head/neck/groin lymphadenopathy Psych:  No delerium/psychosis/paranoia.  Oriented x 4 HENT: Normocephalic, Mucus membranes moist.  No thrush Neck: Supple, No tracheal deviation.  No obvious thyromegaly Chest: No pain to chest wall compression.  Good respiratory excursion.  No audible wheezing CV:  Pulses intact.  Regular rhythm.  No major extremity edema MS: Normal AROM mjr joints.  No obvious deformity  Abdomen: Soft.  Mildy distended.  Midline  dressing with old blood but otherwise clean.  Left upper quadrant colostomy with gas and stool in bag.  Pink..  No evidence of peritonitis.  No incarcerated hernias.  Ext:   No deformity.  No mjr edema.  No cyanosis Skin: No petechiae / purpurea.  No major sores.  Warm and dry    Results:   Cultures: No results found for this or any previous visit (from the past 720 hours).  Labs: Results for orders placed or performed during the hospital encounter of 07/16/23 (from the past 48 hours)  Magnesium     Status: Abnormal   Collection Time: 07/22/23  3:08 AM  Result Value Ref Range   Magnesium 1.3 (L) 1.7 - 2.4 mg/dL    Comment: Performed at [facility], [address] [place], [place] [address]  Phosphorus     Status: None   Collection Time: 07/22/23  3:08 AM  Result Value Ref Range   Phosphorus 2.7 2.5 - 4.6 mg/dL    Comment: Performed at [facility], [address], [place], [place] [address]  CBC with Differential/Platelet     Status: Abnormal   Collection Time: 07/22/23  3:09 AM  Result Value Ref Range   WBC 6.7 4.0 - 10.5 K/uL   RBC 2.92 (L) 3.87 - 5.11 MIL/uL   Hemoglobin 9.2 (L) 12.0 - 15.0 g/dL   HCT 21.3 (L) 08.6 - 57.8 %   MCV 99.3 80.0 - 100.0 fL   MCH 31.5 26.0 - 34.0 pg   MCHC 31.7 30.0 - 36.0 g/dL   RDW 46.9 62.9 - 52.8 %   Platelets 237 150 - 400 K/uL   nRBC 0.0 0.0 - 0.2 %   Neutrophils Relative % 61 %   Neutro Abs 4.1 1.7 - 7.7 K/uL   Lymphocytes Relative 21 %   Lymphs Abs 1.4 0.7 - 4.0 K/uL   Monocytes Relative 15 %   Monocytes Absolute 1.0 0.1 - 1.0 K/uL   Eosinophils Relative 1 %   Eosinophils Absolute 0.1 0.0 - 0.5 K/uL   Basophils Relative 0 %   Basophils Absolute 0.0 0.0 - 0.1 K/uL   Immature Granulocytes 2 %   Abs Immature Granulocytes 0.11 (H) 0.00 - 0.07 K/uL    Comment: Performed at [facility], [address], [place], [place] [address]  Basic metabolic panel     Status: Abnormal    Collection Time: 07/22/23  3:09 AM  Result Value Ref Range   Sodium 135 135 - 145 mmol/L   Potassium 3.0 (L) 3.5 - 5.1 mmol/L   Chloride 102 98 - 111 mmol/L   CO2 25 22 - 32 mmol/L   Glucose, Bld 100 (H) 70 - 99 mg/dL    Comment: Glucose reference range applies only to samples taken after fasting for at least 8 hours.   BUN 7 (L) 8 - 23 mg/dL   Creatinine, Ser 4.01 (  L) 0.44 - 1.00 mg/dL   Calcium 7.8 (L) 8.9 - 10.3 mg/dL   GFR, Estimated >16 >10 mL/min    Comment: (NOTE) Calculated using the CKD-EPI Creatinine Equation (2021)    Anion gap 8 5 - 15    Comment: Performed at [facility], [address], [place], [place] [address]  Creatinine, serum     Status: None   Collection Time: 07/23/23  3:30 AM  Result Value Ref Range   Creatinine, Ser 0.48 0.44 - 1.00 mg/dL   GFR, Estimated >40 >98 mL/min    Comment: (NOTE) Calculated using the CKD-EPI Creatinine Equation (2021) Performed at [facility], [address], [place], [place] [address]   Prealbumin     Status: Abnormal   Collection Time: 07/23/23  3:30 AM  Result Value Ref Range   Prealbumin 7 (L) 18 - 38 mg/dL    Comment: Performed at [facility], [address] [place], [place] [address]    Imaging / Studies: No results found.  Medications / Allergies: per chart  Antibiotics: Anti-infectives (From admission, onward)    Start     Dose/Rate Route Frequency Ordered Stop   07/19/23 1400  ceFAZolin (ANCEF) IVPB 2g/100 mL premix        2 g 200 mL/hr over 30 Minutes Intravenous  Once 07/19/23 1357 07/19/23 1350   07/19/23 1322  ceFAZolin (ANCEF) 2-4 GM/100ML-% IVPB       Note to Pharmacy: Maricela Curet J: cabinet override      07/19/23 1322 07/19/23 1400         Note: Portions of this report may have been transcribed using voice recognition software. Every effort was made to ensure accuracy; however, inadvertent computerized transcription errors may be present.   Any  transcriptional errors that result from this process are unintentional.    Ardeth Sportsman, MD, FACS, MASCRS Esophageal, Gastrointestinal & Colorectal Surgery Robotic and Minimally Invasive Surgery  [facility] [address], [address] [place], [place] [address] [phone] Fax [phone] Main  CONTACT INFORMATION: Weekday (9AM-5PM): Call [facility] main office at [phone] Weeknight (5PM-9AM) or Weekend/Holiday: Check EPIC "Web Links" tab & use "AMION" (password " [email or web address]") for General Surgery [facility] coverage  Please, DO NOT use SecureChat  (it is not reliable communication to reach operating surgeons & will lead to a delay in care).   Epic staff messaging available for outptient concerns needing 1-2 business day response.      07/23/2023  7:58 AM

## 2023-07-23 NOTE — Discharge Instructions (Signed)
 #######################################################  Ostomy Support Information  You've heard that people get along just fine with only one of their eyes, or one of their lungs, or one of their kidneys. But you also know that you have only one intestine and only one bladder, and that leaves you feeling awfully empty, both physically and emotionally: You think no other people go around without part of their intestine with the ends of their intestines sticking out through their abdominal walls.   YOU ARE NOT ALONE.  There are nearly three quarters of a million people in the [place] who have an ostomy; people who have had surgery to remove all or part of their colons or bladders.   There is even a national association, the [place] [employer] [place] with over 350 local affiliated support groups that are organized by volunteers who provide peer support and counseling. Stacy Rivas has a toll free telephone num-ber, [phone] and an educational, interactive website, www.ostomy.org   An ostomy is an opening in the belly (abdominal wall) made by surgery. Ostomates are people who have had this procedure. The opening (stoma) allows the kidney or bowel to grdischarge waste. An external pouch covers the stoma to collect waste. Pouches are are a simple bag and are odor free. Different companies have disposable or reusable pouches to fit one's lifestyle. An ostomy can either be temporary or permanent.   THERE ARE THREE MAIN TYPES OF OSTOMIES Colostomy. A colostomy is a surgically created opening in the large intestine (colon). Ileostomy. An ileostomy is a surgically created opening in the small intestine. Urostomy. A urostomy is a surgically created opening to divert urine away from the bladder.  OSTOMY Care  The following guidelines will make care of your colostomy easier. Keep this information close by for quick reference.  Helpful DIET hints Eat a well-balanced diet including vegetables and fresh  fruits. Eat on a regular schedule.  Drink at least 6 to 8 glasses of fluids daily. Eat slowly in a relaxed atmosphere. Chew your food thoroughly. Avoid chewing gum, smoking, and drinking from a straw. This will help decrease the amount of air you swallow, which may help reduce gas. Eating yogurt or drinking buttermilk may help reduce gas.  To control gas at night, do not eat after 8 p.m. This will give your bowel time to quiet down before you go to bed.  If gas is a problem, you can purchase Beano. Sprinkle Beano on the first bite of food before eating to reduce gas. It has no flavor and should not change the taste of your food. You can buy Beano over the counter at your local drugstore.  Foods like fish, onions, garlic, broccoli, asparagus, and cabbage produce odor. Although your pouch is odor-proof, if you eat these foods you may notice a stronger odor when emptying your pouch. If this is a concern, you may want to limit these foods in your diet.  If you have an ileostomy, you will have chronic diarrhea & need to drink more liquids to avoid getting dehydrated.  Consider antidiarrheal medicine like imodium (loperamide) or Lomotil to help slow down bowel movements / diarrhea into your ileostomy bag.  GETTING TO GOOD BOWEL HEALTH WITH AN ILEOSTOMY    With the colon bypassed & not in use, you will have small bowel diarrhea.   It is important to thicken & slow your bowel movements down.   The goal: 4-6 small BOWEL MOVEMENTS A DAY It is important to drink plenty of liquids to avoid  getting dehydrated  CONTROLLING ILEOSTOMY DIARRHEA  TAKE A FIBER SUPPLEMENT (FiberCon or Benefiner soluble fiber) twice a day - to thicken stools by absorbing excess fluid and retrain the intestines to act more normally.  Slowly increase the dose over a few weeks.  Too much fiber too soon can backfire and cause cramping & bloating.  TAKE AN IRON SUPPLEMENT twice a day to naturally constipate your bowels.  Usually  ferrous sulfate 325mg  twice a day)  TAKE ANTI-DIARRHEAL MEDICINES: Loperamide (Imodium) can slow down diarrhea.  Start with two tablets (= 4mg ) first and then try one tablet every 6 hours.  Can go up to 2 pills four times day (8 pills of 2mg  max) Avoid if you are having fevers or severe pain.  If you are not better or start feeling worse, stop all medicines and call your doctor for advice LoMotil (Diphenoxylate / Atropine) is another medicine that can constipate & slow down bowel moevements Pepto Bismol (bismuth) can gently thicken bowels as well  If diarrhea is worse,: drink plenty of liquids and try simpler foods for a few days to avoid stressing your intestines further. Avoid dairy products (especially milk & ice cream) for a short time.  The intestines often can lose the ability to digest lactose when stressed. Avoid foods that cause gassiness or bloating.  Typical foods include beans and other legumes, cabbage, broccoli, and dairy foods.  Every person has some sensitivity to other foods, so listen to our body and avoid those foods that trigger problems for you.Call your doctor if you are getting worse or not better.  Sometimes further testing (cultures, endoscopy, X-ray studies, bloodwork, etc) may be needed to help diagnose and treat the cause of the diarrhea. Take extra anti-diarrheal medicines (maximum is 8 pills of 2mg  loperamide a day)   Tips for POUCHING an OSTOMY   Changing Your Pouch The best time to change your pouch is in the morning, before eating or drinking anything. Your stoma can function at any time, but it will function more after eating or drinking.   Applying the pouching system  Place all your equipment close at hand before removing your pouch.  Wash your hands.  Stand or sit in front of a mirror. Use the position that works best for you. Remember that you must keep the skin around the stoma wrinkle-free for a good seal.  Gently remove the used pouch (1-piece  system) or the pouch and old wafer (2-piece system). Empty the pouch into the toilet. Save the closure clip to use again.  Wash the stoma itself and the skin around the stoma. Your stoma may bleed a little when being washed. This is normal. Rinse and pat dry. You may use a wash cloth or soft paper towels (like Bounty), mild soap (like Dial, Safeguard, or [place]), and water. Avoid soaps that contain perfumes or lotions.  For a new pouch (1-piece system) or a new wafer (2-piece system), measure your stoma using the stoma guide in each box of supplies.  Trace the shape of your stoma onto the back of the new pouch or the back of the new wafer. Cut out the opening. Remove the paper backing and set it aside.  Optional: Apply a skin barrier powder to surrounding skin if it is irritated (bare or weeping), and dust off the excess. Optional: Apply a skin-prep wipe (such as Skin Prep or All-Kare) to the skin around the stoma, and let it dry. Do not apply this solution if the  skin is irritated (red, tender, or broken) or if you have shaved around the stoma. Optional: Apply a skin barrier paste (such as Stomahesive, Coloplast, or Premium) around the opening cut in the back of the pouch or wafer. Allow it to dry for 30 to 60 seconds.  Hold the pouch (1-piece system) or wafer (2-piece system) with the sticky side toward your body. Make sure the skin around the stoma is wrinkle-free. Center the opening on the stoma, then press firmly to your abdomen (Fig. 4). Look in the mirror to check if you are placing the pouch, or wafer, in the right position. For a 2-piece system, snap the pouch onto the wafer. Make sure it snaps into place securely.  Place your hand over the stoma and the pouch or wafer for about 30 seconds. The heat from your hand can help the pouch or wafer stick to your skin.  Add deodorant (such as Super Banish or Nullo) to your pouch. Other options include food extracts such as vanilla oil and peppermint  extract. Add about 10 drops of the deodorant to the pouch. Then apply the closure clamp. Note: Do not use toxic  chemicals or commercial cleaning agents in your pouch. These substances may harm the stoma.  Optional: For extra seal, apply tape to all 4 sides around the pouch or wafer, as if you were framing a picture. You may use any brand of medical adhesive tape. Change your pouch every 5 to 7 days. Change it immediately if a leak occurs.  Wash your hands afterwards.  If you are wearing a 2-piece system, you may use 2 new pouches per week and alternate them. Rinse the pouch with mild soap and warm water and hang it to dry for the next day. Apply the fresh pouch. Alternate the 2 pouches like this for a week. After a week, change the wafer and begin with 2 new pouches. Place the old pouches in a plastic bag, and put them in the trash.   LIVING WITH AN OSTOMY  Emptying Your Pouch Empty your pouch when it is one-third full (of urine, stool, and/or gas). If you wait until your pouch is fuller than this, it will be more difficult to empty and more noticeable. When you empty your pouch, either put toilet paper in the toilet bowl first, or flush the toilet while you empty the pouch. This will reduce splashing. You can empty the pouch between your legs or to one side while sitting, or while standing or stooping. If you have a 2-piece system, you can snap off the pouch to empty it. Remember that your stoma may function during this time. If you wish to rinse your pouch after you empty it, a [place] baster can be helpful. When using a baster, squirt water up into the pouch through the opening at the bottom. With a 2-piece system, you can snap off the pouch to rinse it. After rinsing  your pouch, empty it into the toilet. When rinsing your pouch at home, put a few granules of Dreft soap in the rinse water. This helps lubricate and freshen your pouch. The inside of your pouch can be sprayed with non-stick cooking  oil (Pam spray). This may help reduce stool sticking to the inside of the pouch.  Bathing You may shower or bathe with your pouch on or off. Remember that your stoma may function during this time.  The materials you use to wash your stoma and the skin around it should be  clean, but they do not need to be sterile.  Wearing Your Pouch During hot weather, or if you perspire a lot in general, wear a cover over your pouch. This may prevent a rash on your skin under the pouch. Pouch covers are sold at ostomy supply stores. Wear the pouch inside your underwear for better support. Watch your weight. Any gain or loss of 10 to 15 pounds or more can change the way your pouch fits.  Going Away From Home A collapsible cup (like those that come in travel kits) or a soft plastic squirt bottle with a pull-up top (like a travel bottle for shampoo) can be used for rinsing your pouch when you are away from home. Tilt the opening of the pouch at an upward angle when using a cup to rinse.  Carry wet wipes or extra tissues to use in public bathrooms.  Carry an extra pouching system with you at all times.  Never keep ostomy supplies in the glove compartment of your car. Extreme heat or cold can damage the skin barriers and adhesive wafers on the pouch.  When you travel, carry your ostomy supplies with you at all times. Keep them within easy reach. Do not pack ostomy supplies in baggage that will be checked or otherwise separated from you, because your baggage might be lost. If you're traveling out of the country, it is helpful to have a letter stating that you are carrying ostomy supplies as a medical necessity.  If you need ostomy supplies while traveling, look in the yellow pages of the telephone book under "Surgical Supplies." Or call the local ostomy organization to find out where supplies are available.  Do not let your ostomy supplies get low. Always order new pouches before you use the last one.  Reducing  Odor Limit foods such as broccoli, cabbage, onions, fish, and garlic in your diet to help reduce odor. Each time you empty your pouch, carefully clean the opening of the pouch, both inside and outside, with toilet paper. Rinse your pouch 1 or 2 times daily after you empty it (see directions for emptying your pouch and going away from home). Add deodorant (such as Super Banish or Nullo) to your pouch. Use air deodorizers in your bathroom. Do not add aspirin to your pouch. Even though aspirin can help prevent odor, it could cause ulcers on your stoma.  When to call the doctor Call the doctor if you have any of the following symptoms: Purple, black, or white stoma Severe cramps lasting more than 6 hours Severe watery discharge from the stoma lasting more than 6 hours No output from the colostomy for 3 days Excessive bleeding from your stoma Swelling of your stoma to more than 1/2-inch larger than usual Pulling inward of your stoma below skin level Severe skin irritation or deep ulcers Bulging or other changes in your abdomen  When to call your ostomy nurse Call your ostomy/enterostomal therapy (WOCN) nurse if any of the following occurs: Frequent leaking of your pouching system Change in size or appearance of your stoma, causing discomfort or problems with your pouch Skin rash or rawness Weight gain or loss that causes problems with your pouch     FREQUENTLY ASKED QUESTIONS   Why haven't you met any of these folks who have an ostomy?  Well, maybe you have! You just did not recognize them because an ostomy doesn't show. It can be kept secret if you wish. Why, maybe some of your best friends, office  associates or neighbors have an ostomy ... you never can tell. People facing ostomy surgery have many quality-of-life questions like: Will you bulge? Smell? Make noises? Will you feel waste leaving your body? Will you be a captive of the toilet? Will you starve? Be a social outcast? Get/stay  married? Have babies? Easily bathe, go swimming, bend over?  OK, let's look at what you can expect:   Will you bulge?  Remember, without part of the intestine or bladder, and its contents, you should have a flatter tummy than before. You can expect to wear, with little exception, what you wore before surgery ... and this in-cludes tight clothing and bathing suits.   Will you smell?  Today, thanks to modern odor proof pouching systems, you can walk into an ostomy support group meeting and not smell anything that is foul or offensive. And, for those with an ileostomy or colostomy who are concerned about odor when emptying their pouch, there are in-pouch deodorants that can be used to eliminate any waste odors that may exist.   Will you make noises?  Everyone produces gas, especially if they are an air-swallower. But intestinal sounds that occur from time to time are no differ-ent than a gurgling tummy, and quite often your clothing will muffle any sounds.   Will you feel the waste discharges?  For those with a colostomy or ileostomy there might be a slight pressure when waste leaves your body, but understand that the intestines have no nerve endings, so there will be no unpleasant sensations. Those with a urostomy will probably be unaware of any kidney drainage.   Will you be a captive of the toilet?  Immediately post-op you will spend more time in the bathroom than you will after your body recovers from surgery. Every person is different, but on average those with an ileostomy or urostomy may empty their pouches 4 to 6 times a day; a little  less if you have a colostomy. The average wear time between pouch system changes is 3 to 5 days and the changing process should take less than 30 minutes.   Will I need to be on a special diet? Most people return to their normal diet when they have recovered from surgery. Be sure to chew your food well, eat a well-balanced diet and drink plenty of fluids. If  you experience problems with a certain food, wait a couple of weeks and try it again.  Will there be odor and noises? Pouching systems are designed to be odor-proof or odor-resistant. There are deodorants that can be used in the pouch. Medications are also available to help reduce odor. Limit gas-producing foods and carbonated beverages. You will experience less gas and fewer noises as you heal from surgery.  How much time will it take to care for my ostomy? At first, you may spend a lot of time learning about your ostomy and how to take care of it. As you become more comfortable and skilled at changing the pouching system, it will take very little time to care for it.   Will I be able to return to work? People with ostomies can perform most jobs. As soon as you have healed from surgery, you should be able to return to work. Heavy lifting (more than 10 pounds) may be discouraged.   What about intimacy? Sexual relationships and intimacy are important and fulfilling aspects of your life. They should continue after ostomy surgery. Intimacy-related concerns should be discussed openly between you and  your partner.   Can I wear regular clothing? You do not need to wear special clothing. Ostomy pouches are fairly flat and barely noticeable. Elastic undergarments will not hurt the stoma or prevent the ostomy from functioning.   Can I participate in sports? An ostomy should not limit your involvement in sports. Many people with ostomies are runners, skiers, swimmers or participate in other active lifestyles. Talk with your caregiver first before doing heavy physical activity.  Will you starve?  Not if you follow doctor's orders at each stage of your post-op adjustment. There is no such thing as an "ostomy diet". Some people with an ostomy will be able to eat and tolerate anything; others may find diffi-culty with some foods. Each person is an individual and must determine, by trial, what is best for  them. A good practice for all is to drink plenty of water.   Will you be a social outcast?  Have you met anyone who has an ostomy and is a social outcast? Why should you be the first? Only your attitude and self image will effect how you are treated. No confi-dent person is an Investment banker, corporate.    PROFESSIONAL HELP   Resources are available if you need help or have questions about your ostomy.   Specially trained nurses called Wound, Ostomy Continence Nurses (WOCN) are available for consultation in most major medical centers.  Consider getting an ostomy consult at an outpatient ostomy clinic.   [place] has an Ostomy Clinic run by an Chartered certified accountant at the [facility] campus.  [phone]. [facility] can help set up an appointment   The [employer] (UOA) is a group made up of many local chapters throughout the [place]. These local groups hold meetings and provide support to prospective and existing ostomates. They sponsor educational events and have qualified visitors to make personal or telephone visits. Contact the UOA for the chapter nearest you and for other educational publications.  More detailed information can be found in Colostomy Guide, a publication of the [employer] (UOA). Contact UOA at 1-810-363-3410 or visit their web site at YellowSpecialist.at. The website contains links to other sites, suppliers and resources.  Media planner Start Services: Start at the website to enlist for support.  Your Wound Ostomy (WOCN) nurse may have started this process. [email or web address] Secure Start services are designed to support people as they live their lives with an ostomy or neurogenic bladder. Enrolling is easy and at no cost to the patient. We realize that each person's needs and life journey are different. Through Secure Start services, we want to help people live their life, their  way.  #######################################################    SURGERY: POST OP INSTRUCTIONS (Surgery for small bowel obstruction, colon resection, etc)   ######################################################################  EAT Gradually transition to a high fiber diet with a fiber supplement over the next few days after discharge  WALK Walk an hour a day.  Control your pain to do that.    CONTROL PAIN Control pain so that you can walk, sleep, tolerate sneezing/coughing, go up/down stairs.  HAVE A BOWEL MOVEMENT DAILY Keep your bowels regular to avoid problems.  OK to try a laxative to override constipation.  OK to use an antidairrheal to slow down diarrhea.  Call if not better after 2 tries  CALL IF YOU HAVE PROBLEMS/CONCERNS Call if you are still struggling despite following these instructions. Call if you have concerns not answered by these instructions  ######################################################################  DIET Follow a light diet the first few days at home.  Start with a bland diet such as soups, liquids, starchy foods, low fat foods, etc.  If you feel full, bloated, or constipated, stay on a ful liquid or pureed/blenderized diet for a few days until you feel better and no longer constipated. Be sure to drink plenty of fluids every day to avoid getting dehydrated (feeling dizzy, not urinating, etc.). Gradually add a fiber supplement to your diet over the next week.  Gradually get back to a regular solid diet.  Avoid fast food or heavy meals the first week as you are more likely to get nauseated. It is expected for your digestive tract to need a few months to get back to normal.  It is common for your bowel movements and stools to be irregular.  You will have occasional bloating and cramping that should eventually fade away.  Until you are eating solid food normally, off all pain medications, and back to regular activities; your bowels will not be  normal. Focus on eating a low-fat, high fiber diet the rest of your life (See Getting to Good Bowel Health, below).  CARE of your INCISION or WOUND  It is good for closed incisions and even open wounds to be washed every day.  Shower every day.  Short baths are fine.  Wash the incisions and wounds clean with soap & water.    You may leave closed incisions open to air if it is dry.   You may cover the incision with clean gauze & replace it after your daily shower for comfort.     ACTIVITIES as tolerated Start light daily activities --- self-care, walking, climbing stairs-- beginning the day after surgery.  Gradually increase activities as tolerated.  Control your pain to be active.  Stop when you are tired.  Ideally, walk several times a day, eventually an hour a day.   Most people are back to most day-to-day activities in a few weeks.  It takes 4-8 weeks to get back to unrestricted, intense activity. If you can walk 30 minutes without difficulty, it is safe to try more intense activity such as jogging, treadmill, bicycling, low-impact aerobics, swimming, etc. Save the most intensive and strenuous activity for last (Usually 4-8 weeks after surgery) such as sit-ups, heavy lifting, contact sports, etc.  Refrain from any intense heavy lifting or straining until you are off narcotics for pain control.  You will have off days, but things should improve week-by-week. DO NOT PUSH THROUGH PAIN.  Let pain be your guide: If it hurts to do something, don't do it.  Pain is your body warning you to avoid that activity for another week until the pain goes down. You may drive when you are no longer taking narcotic prescription pain medication, you can comfortably wear a seatbelt, and you can safely make sudden turns/stops to protect yourself without hesitating due to pain. You may have sexual intercourse when it is comfortable. If it hurts to do something, stop.   MEDICATIONS Take your usually prescribed home  medications unless otherwise directed.    Blood thinners:  You can restart any strong blood thinners after the second postoperative day  for example: COUMADIN (warfarin), XERELTO (rivaroxaban), ELIQUIS (apixaban), PLAVIX (clopidigrel), BRILINTA (ticagrelor), EFFIENT (prasugrel), PRADAXA (dabigatran), etc  Continue aspirin before & after surgery..     Some oozing/bleeding the first 1-2 weeks is common but should taper down & be small volume.    If you are  passing many large clots or having uncontrolling bleeding, call your surgeon    PAIN CONTROL Pain after surgery or related to activity is often due to strain/injury to muscle, tendon, nerves and/or incisions.  This pain is usually short-term and will improve in a few months.  To help speed the process of healing and to get back to regular activity more quickly, DO THE FOLLOWING THINGS TOGETHER: Increase activity gradually.  DO NOT PUSH THROUGH PAIN Use Ice and/or Heat Try Gentle Massage and/or Stretching Take over the counter pain medication Take Narcotic prescription pain medication for more severe pain  Good pain control = faster recovery.  It is better to take more medicine to be more active than to stay in bed all day to avoid medications.  Increase activity gradually Avoid heavy lifting at first, then increase to lifting as tolerated over the next 6 weeks. Do not "push through" the pain.  Listen to your body and avoid positions and maneuvers than reproduce the pain.  Wait a few days before trying something more intense Walking an hour a day is encouraged to help your body recover faster and more safely.  Start slowly and stop when getting sore.  If you can walk 30 minutes without stopping or pain, you can try more intense activity (running, jogging, aerobics, cycling, swimming, treadmill, sex, sports, weightlifting, etc.) Remember: If it hurts to do it, then don't do it! Use Ice and/or Heat You will have swelling and bruising  around the incisions.  This will take several weeks to resolve. Ice packs or heating pads (6-8 times a day, 30-60 minutes at a time) will help sooth soreness & bruising. Some people prefer to use ice alone, heat alone, or alternate between ice & heat.  Experiment and see what works best for you.  Consider trying ice for the first few days to help decrease swelling and bruising; then, switch to heat to help relax sore spots and speed recovery. Shower every day.  Short baths are fine.  It feels good!  Keep the incisions and wounds clean with soap & water.   Try Gentle Massage and/or Stretching Massage at the area of pain many times a day Stop if you feel pain - do not overdo it Take over the counter pain medication This helps the muscle and nerve tissues become less irritable and calm down faster Choose ONE of the following over-the-counter anti-inflammatory medications: Acetaminophen 500mg  tabs (Tylenol) 1-2 pills with every meal and just before bedtime (avoid if you have liver problems or if you have acetaminophen in you narcotic prescription) Naproxen 220mg  tabs (ex. Aleve, Naprosyn) 1-2 pills twice a day (avoid if you have kidney, stomach, IBD, or bleeding problems) Ibuprofen 200mg  tabs (ex. Advil, Motrin) 3-4 pills with every meal and just before bedtime (avoid if you have kidney, stomach, IBD, or bleeding problems) Take with food/snack several times a day as directed for at least 2 weeks to help keep pain / soreness down & more manageable. Take Narcotic prescription pain medication for more severe pain A prescription for strong pain control is often given to you upon discharge (for example: oxycodone/Percocet, hydrocodone/Norco/Vicodin, or tramadol/Ultram) Take your pain medication as prescribed. Be mindful that most narcotic prescriptions contain Tylenol (acetaminophen) as well - avoid taking too much Tylenol. If you are having problems/concerns with the prescription medicine (does not control  pain, nausea, vomiting, rash, itching, etc.), please call us [phone] to see if we need to switch you to a different pain medicine  that will work better for you and/or control your side effects better. If you need a refill on your pain medication, you must call the office before 4 pm and on weekdays only.  By federal law, prescriptions for narcotics cannot be called into a pharmacy.  They must be filled out on paper & picked up from our office by the patient or authorized caretaker.  Prescriptions cannot be filled after 4 pm nor on weekends.    WHEN TO CALL us [phone] Severe uncontrolled or worsening pain  Fever over 101 F (38.5 C) Concerns with the incision: Worsening pain, redness, rash/hives, swelling, bleeding, or drainage Reactions / problems with new medications (itching, rash, hives, nausea, etc.) Nausea and/or vomiting Difficulty urinating Difficulty breathing Worsening fatigue, dizziness, lightheadedness, blurred vision Other concerns If you are not getting better after two weeks or are noticing you are getting worse, contact our office [phone] for further advice.  We may need to adjust your medications, re-evaluate you in the office, send you to the emergency room, or see what other things we can do to help. The clinic staff is available to answer your questions during regular business hours (8:30am-5pm).  Please don't hesitate to call and ask to speak to one of our nurses for clinical concerns.    A surgeon from [facility] is always on call at the hospitals 24 hours/day If you have a medical emergency, go to the nearest emergency room or call 911.  FOLLOW UP in our office One the day of your discharge from the hospital (or the next business weekday), please call [facility] to set up or confirm an appointment to see your surgeon in the office for a follow-up appointment.  Usually it is 2-3 weeks after your surgery.   If you have skin  staples at your incision(s), let the office know so we can set up a time in the office for the nurse to remove them (usually around 10 days after surgery). Make sure that you call for appointments the day of discharge (or the next business weekday) from the hospital to ensure a convenient appointment time. IF YOU HAVE DISABILITY OR FAMILY LEAVE FORMS, BRING THEM TO THE OFFICE FOR PROCESSING.  DO NOT GIVE THEM TO YOUR DOCTOR.  [facility] [address], [place], [place]  [address] ? [phone] - Main [phone] - Toll Free,  [phone] - Fax [email or web address]    GETTING TO GOOD BOWEL HEALTH. It is expected for your digestive tract to need a few months to get back to normal.  It is common for your bowel movements and stools to be irregular.  You will have occasional bloating and cramping that should eventually fade away.  Until you are eating solid food normally, off all pain medications, and back to regular activities; your bowels will not be normal.   Avoiding constipation The goal: ONE SOFT BOWEL MOVEMENT A DAY!    Drink plenty of fluids.  Choose water first. TAKE A FIBER SUPPLEMENT EVERY DAY THE REST OF YOUR LIFE During your first week back home, gradually add back a fiber supplement every day Experiment which form you can tolerate.   There are many forms such as powders, tablets, wafers, gummies, etc Psyllium bran (Metamucil), methylcellulose (Citrucel), Miralax or Glycolax, Benefiber, Flax Seed.  Adjust the dose week-by-week (1/2 dose/day to 6 doses a day) until you are moving your bowels 1-2 times a day.  Cut back the dose  or try a different fiber product if it is giving you problems such as diarrhea or bloating. Sometimes a laxative is needed to help jump-start bowels if constipated until the fiber supplement can help regulate your bowels.  If you are tolerating eating & you are farting, it is okay to try a gentle laxative such as  double dose MiraLax, prune juice, or Milk of Magnesia.  Avoid using laxatives too often. Stool softeners can sometimes help counteract the constipating effects of narcotic pain medicines.  It can also cause diarrhea, so avoid using for too long. If you are still constipated despite taking fiber daily, eating solids, and a few doses of laxatives, call our office. Controlling diarrhea Try drinking liquids and eating bland foods for a few days to avoid stressing your intestines further. Avoid dairy products (especially milk & ice cream) for a short time.  The intestines often can lose the ability to digest lactose when stressed. Avoid foods that cause gassiness or bloating.  Typical foods include beans and other legumes, cabbage, broccoli, and dairy foods.  Avoid greasy, spicy, fast foods.  Every person has some sensitivity to other foods, so listen to your body and avoid those foods that trigger problems for you. Probiotics (such as active yogurt, Align, etc) may help repopulate the intestines and colon with normal bacteria and calm down a sensitive digestive tract Adding a fiber supplement gradually can help thicken stools by absorbing excess fluid and retrain the intestines to act more normally.  Slowly increase the dose over a few weeks.  Too much fiber too soon can backfire and cause cramping & bloating. It is okay to try and slow down diarrhea with a few doses of antidiarrheal medicines.   Bismuth subsalicylate (ex. Kayopectate, Pepto Bismol) for a few doses can help control diarrhea.  Avoid if pregnant.   Loperamide (Imodium) can slow down diarrhea.  Start with one tablet (2mg ) first.  Avoid if you are having fevers or severe pain.  ILEOSTOMY PATIENTS WILL HAVE CHRONIC DIARRHEA since their colon is not in use.    Drink plenty of liquids.  You will need to drink even more glasses of water/liquid a day to avoid getting dehydrated. Record output from your ileostomy.  Expect to empty the bag every 3-4  hours at first.  Most people with a permanent ileostomy empty their bag 4-6 times at the least.   Use antidiarrheal medicine (especially Imodium) several times a day to avoid getting dehydrated.  Start with a dose at bedtime & breakfast.  Adjust up or down as needed.  Increase antidiarrheal medications as directed to avoid emptying the bag more than 8 times a day (every 3 hours). Work with your wound ostomy nurse to learn care for your ostomy.  See ostomy care instructions. TROUBLESHOOTING IRREGULAR BOWELS 1) Start with a soft & bland diet. No spicy, greasy, or fried foods.  2) Avoid gluten/wheat or dairy products from diet to see if symptoms improve. 3) Miralax 17gm or flax seed mixed in 8oz. water or juice-daily. May use 2-4 times a day as needed. 4) Gas-X, Phazyme, etc. as needed for gas & bloating.  5) Prilosec (omeprazole) over-the-counter as needed 6)  Consider probiotics (Align, Activa, etc) to help calm the bowels down  Call your doctor if you are getting worse or not getting better.  Sometimes further testing (cultures, endoscopy, X-ray studies, CT scans, bloodwork, etc.) may be needed to help diagnose and treat the cause of the diarrhea. [facility] [place]  [address], [address], [place], [place]  [address] [phone] - Main.    [phone]  - Toll Free.   [phone] - Fax [email or web address]

## 2023-07-23 NOTE — Progress Notes (Signed)
 Mobility Specialist - Progress Note   07/23/23 1305  Mobility  Activity Ambulated independently in hallway  Level of Assistance Independent  Assistive Device None  Distance Ambulated (ft) 350 ft  Activity Response Tolerated well  Mobility Referral Yes  Mobility visit 1 Mobility  Mobility Specialist Start Time (ACUTE ONLY) 1255  Mobility Specialist Stop Time (ACUTE ONLY) 1305  Mobility Specialist Time Calculation (min) (ACUTE ONLY) 10 min   Pt received in recliner and agreeable to mobility. No complaints during session. Pt to recliner after session with all needs met.    [facility]

## 2023-07-23 NOTE — Plan of Care (Signed)

## 2023-07-23 NOTE — Consult Note (Signed)
 WOC consulted for ostomy care, teaching.  Patient is already being followed by [facility] Nursing team.   [facility] MSN,RN,CWOCN, CNS, CWON-AP [phone]

## 2023-07-24 ENCOUNTER — Other Ambulatory Visit: Payer: Self-pay | Admitting: Hematology and Oncology

## 2023-07-24 DIAGNOSIS — K56609 Unspecified intestinal obstruction, unspecified as to partial versus complete obstruction: Secondary | ICD-10-CM | POA: Diagnosis not present

## 2023-07-24 MED ORDER — ONDANSETRON 4 MG PO TBDP: 4.0000 mg | ORAL_TABLET | Freq: Three times a day (TID) | ORAL | 0 refills | Status: DC | PRN

## 2023-07-24 MED ORDER — HEPARIN SOD (PORK) LOCK FLUSH 100 UNIT/ML IV SOLN
500.0000 [IU] | INTRAVENOUS | Status: AC | PRN
  Administered 2023-07-24: 500 [IU]

## 2023-07-24 NOTE — Plan of Care (Signed)
  Problem: Clinical Measurements: Goal: Ability to maintain clinical measurements within normal limits will improve Outcome: Progressing Goal: Diagnostic test results will improve Outcome: Progressing Goal: Respiratory complications will improve Outcome: Progressing Goal: Cardiovascular complication will be avoided Outcome: Progressing   Problem: Activity: Goal: Risk for activity intolerance will decrease Outcome: Progressing   Problem: Coping: Goal: Level of anxiety will decrease Outcome: Progressing   Problem: Pain Managment: Goal: General experience of comfort will improve and/or be controlled Outcome: Progressing

## 2023-07-24 NOTE — Progress Notes (Signed)
 Pt discharge instructions reviewed. Pt verbalized understanding. Pt escorted via wheelchair to main entrance with family member.

## 2023-07-24 NOTE — Consult Note (Addendum)
 WOC Nurse ostomy follow up Niece who is an [employer] fundraiser is at the bedside for pouch change and teaching session.  Stoma is red and viable, 1 3/4 inches, flush with skin level, large red rubber rod in place.  50cc liquid brown stool emptied from pouch.  Pt was able to stretch and apply the barrier to the back and apply the pouch with assistance, using a hand held mirror. Discussed that the rod will make it difficult to maintain a seal as long as it is in place.  Pt states she has practiced emptying the pouch in the bathroom. She was able to open and close the velcro. Reviewed pouching routines and ordering supplies.  Educational materials at the bedside, along with 5 sets of each supply for staff nurses' use. Use Supplies: barrier rings, [employer] # [id number], convex wafer [employer] # [id number], Gigi Gin # [phone] Enrolled patient in [employer] Discharge program: Yes; previously Thank-you,  Cammie Mcgee MSN, RN, CWOCN, [facility], CNS [phone]

## 2023-07-24 NOTE — Discharge Summary (Signed)
 Physician Discharge Summary  Stacy Rivas:[id number] DOB: 12-10-1955 DOA: 07/16/2023  PCP: Stacy Paradise, MD  Admit date: 07/16/2023 Discharge date: 07/24/2023 30 Day Unplanned Readmission Risk Score    Flowsheet Row ED to Hosp-Admission (Current) from 07/16/2023 in [facility]  30 Day Unplanned Readmission Risk Score (%) 27.86 Filed at 07/24/2023 0801       This score is the patient's risk of an unplanned readmission within 30 days of being discharged (0 -100%). The score is based on dignosis, age, lab data, medications, orders, and past utilization.   Low:  0-14.9   Medium: 15-21.9   High: 22-29.9   Extreme: 30 and above          Admitted From: Home Disposition: Home  Recommendations for Outpatient Follow-up:  Follow up with PCP in 1-2 weeks Please obtain BMP/CBC in one week Follow-up with your primary oncologist in 1 to 2 weeks Follow-up with general surgery as they have delineated. Please follow up with your PCP on the following pending results: Unresulted Labs (From admission, onward)     Start     Ordered   07/24/23 0758  Basic metabolic panel  Once,   R       Question:  Specimen collection method  Answer:  IV Team=IV Team collect   07/24/23 0758   07/24/23 0758  Magnesium  Once,   R       Question:  Specimen collection method  Answer:  IV Team=IV Team collect   07/24/23 0758   07/24/23 0758  Phosphorus  Once,   R       Question:  Specimen collection method  Answer:  IV Team=IV Team collect   07/24/23 0758   07/23/23 0500  Creatinine, serum  (enoxaparin (LOVENOX)    CrCl >/= 30 ml/min)  Weekly,   R     Comments: while on enoxaparin therapy    07/16/23 1414              Home Health: Yes Equipment/Devices: None  Discharge Condition: Stable CODE STATUS: Full code Diet recommendation: Cardiac  Subjective: Seen and examined.  Pain is improving.  He is tolerating regular diet.  No abdominal pain or any other  complaint.  She is looking forward to going home today after getting ostomy teachings.  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  She tried several laxatives and stool softeners without improvement.  CT done in the ER shows severe diffuse colonic dilatation with complete obstruction at the level of the distal sigmoid colon due to compression from adjacent malignancy.  Peritoneal implant measuring 11.5 x 9.1 cm.  She is followed by Dr. Bertis Rivas. Seen by GYN oncology Dr. Alvester Rivas.  Outpatient oncology records noted. She has had total abdominal hysterectomy and bilateral salpingo-oophorectomy omentectomy radical tumor debulking by Dr. Andrey Rivas in 2020.  Seen by general surgery, per them, colonic stent is not an option, per their recommendations and after discussing with her primary oncologist Dr. Bertis Rivas, she underwent exploratory laparotomy diverting loop colostomy 07/19/2023.  Started on clears and diet has been advanced and she is tolerating regular diet.  She has been cleared by general surgery for discharge and she will follow-up with them and ostomy clinic.  She will also follow-up with her oncologist.   Hypokalemia/hypomagnesemia: Resolved   anemia of chronic disease  hemoglobin stable.   GERD: Continue Protonix   anxiety takes Xanax at home but she is n.p.o. so she is on IV as needed Ativan here.   moderate protein calorie malnutrition due to acute illness: Patient tolerating diet.  Encouraged p.o. intake.  Encouraged protein shakes twice daily or 3 times daily.   GOC: Palliative care following and will follow-up with her as outpatient.  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  See Progress Note from 12/29/2022.   Skin Adhesives [cyanoacrylate] Hives   Shrimp [shellfish Allergy] Itching   Sulfasalazine Nausea And Vomiting   Sulfonamide Derivatives Nausea Only   Telithromycin    Other Reaction(s): severe dizziness        Medication List     STOP taking these medications    carboxymethylcellulose 0.5 % Soln Commonly known as: REFRESH PLUS   estradiol 0.1 MG/GM vaginal cream Commonly known as: ESTRACE VAGINAL   lactulose 10 GM/15ML solution Commonly known as: CHRONULAC   loratadine 10 MG tablet Commonly known as: CLARITIN   ondansetron 8 MG tablet Commonly known as: ZOFRAN   senna 8.6 MG tablet Commonly known as: SENOKOT       TAKE these medications    acetaminophen 500 MG tablet Commonly known as: TYLENOL Take 500 mg by mouth every 6 (six) hours as needed for mild pain (pain score 1-3) or moderate pain (pain score 4-6).   ALPRAZolam 0.25 MG tablet Commonly known as: XANAX Take 1 tablet (0.25 mg total) by mouth 2 (two) times daily as needed for anxiety or sleep.   esomeprazole 20 MG capsule Commonly known as: NEXIUM Take 20 mg by mouth daily.   lidocaine-prilocaine cream Commonly known as: EMLA Apply topically as needed.   magnesium oxide 400 (240 Mg) MG tablet Commonly known as: MAG-OX Take 1 tablet (400 mg total) by mouth daily.   MULTI FOR HER PO Take 1  tablet by mouth daily.   Airborne [employer] 1 each by mouth in the morning, at noon, and at bedtime.   ondansetron 4 MG disintegrating tablet Commonly known as: ZOFRAN-ODT Take 1 tablet (4 mg total) by mouth every 8 (eight) hours as needed for nausea or vomiting.   oxyCODONE 5 MG immediate release tablet Commonly known as: Oxy IR/ROXICODONE Take 1 tablet (5 mg total) by mouth every 6 (six) hours as needed for severe pain (pain score 7-10) or moderate pain (pain score 4-6). What changed:  when to take this reasons to take this   Pepcid 20 MG tablet Generic drug: famotidine Take 20 mg by mouth as needed for heartburn or indigestion.   polyethylene glycol 17 g packet Commonly known as: MIRALAX / GLYCOLAX Take 17 g by mouth 2 (two)  times daily. What changed:  when to take this reasons to take this   prochlorperazine 10 MG tablet Commonly known as: COMPAZINE Take 1 tablet by mouth every 6 hours as needed (Nausea or vomiting).   Vitamin D3 25 MCG (1000 UT) Caps Take 1,000 Units by mouth daily.        Follow-up Information     Chevis Pretty III, MD Follow up in 1 month(s).   Specialty: General Surgery Why: To follow up after your operation Contact information: [address] [place] [address] [phone]         [facility], [place]. Schedule an appointment as soon as possible for a visit in 1 week(s).   Specialty: General Surgery Why: To have sutures/staples removed from incision(s) Contact information: [address] [place] [address] [phone]        Stacy Paradise, MD Follow up in 1 week(s).   Specialty: Internal Medicine Contact information: [address] [place] [address] [phone]                Allergies  Allergen Reactions   Carboplatin Other (See Comments)    Chest tightness/burning, flushing.  See Progress Note from 12/29/2022.   Skin Adhesives [Cyanoacrylate] Hives    Shrimp [Shellfish Allergy] Itching   Sulfasalazine Nausea And Vomiting   Sulfonamide Derivatives Nausea Only   Telithromycin     Other Reaction(s): severe dizziness    Consultations: Oncology and general surgery as well as GYN oncology   Procedures/Studies: DG Abd 1 View Result Date: 07/16/2023 CLINICAL DATA:  Abdominal pain EXAM: ABDOMEN - 1 VIEW COMPARISON:  Abdominal radiographs 07/16/2023 FINDINGS: NG tube in stomach.  Side port below the GE junction. Mild improvement in gaseous distention of colon. For example transverse colon measures 5.7 cm compared to 6.1. Cecum remains distended. No dilated loops of small bowel. IMPRESSION: 1. NG tube in stomach. 2. Some improvement in gaseous distention of the colon. Electronically Signed   By: Genevive Bi M.D.   On: 07/16/2023 17:48   CT ABDOMEN PELVIS WO CONTRAST Result Date: 07/16/2023 CLINICAL DATA:  Generalized abdominal pain for 4 days. History of ovarian cancer. EXAM: CT ABDOMEN AND PELVIS WITHOUT CONTRAST TECHNIQUE: Multidetector CT imaging of the abdomen and pelvis was performed following the standard protocol without IV contrast. RADIATION DOSE REDUCTION: This exam was performed according to the departmental dose-optimization program which includes automated exposure control, adjustment of the mA and/or kV according to patient size and/or use of iterative reconstruction technique. COMPARISON:  July 07, 2023 FINDINGS: Lower chest: No acute abnormality. Hepatobiliary: No focal liver abnormality is seen. No gallstones, gallbladder wall thickening, or biliary dilatation. Pancreas: Unremarkable. No pancreatic ductal dilatation or surrounding inflammatory changes. Spleen: Normal in size without focal abnormality. Adrenals/Urinary Tract: Adrenal glands appear normal. Nonobstructive left nephrolithiasis is noted. Mild bilateral hydroureteronephrosis is noted without obstructing calculus. This is concerning for distal ureteral obstruction.  Urinary bladder is decompressed. Stomach/Bowel: Stomach and appendix are unremarkable. No significant small bowel dilatation is noted. However, there is severe colonic dilatation which appears to be due to distal sigmoid colon obstruction, most likely due to external compression from adjacent peritoneal implant or malignancy. Vascular/Lymphatic: Aortic atherosclerosis. No significant adenopathy is noted currently. Reproductive: Status post hysterectomy. 5.3 x 3.0 cm left adnexal cystic lesion is noted which is not significantly changed compared to prior exam. 11.5 x 9.1 cm complex cystic abnormality is noted in the pelvis with large peripheral solid components.  This is concerning for cystic ovarian malignancy or metastatic disease or possibly loculated ascites and adjacent peritoneal carcinomatosis. Other: No definite hernia is noted. Musculoskeletal: No acute or significant osseous findings. IMPRESSION: Interval development of severe diffuse colonic dilatation with complete obstruction at the level of the distal sigmoid colon, most likely due to external compression from adjacent malignancy or peritoneal implant given history of ovarian carcinoma. There is noted 11.5 x 9.1 cm complex cystic abnormality in the pelvis with large peripheral solid components which is concerning for cystic ovarian malignancy or metastatic disease, or possibly loculated ascites with adjacent peritoneal carcinomatosis. Nonobstructive left nephrolithiasis is noted. Mild bilateral hydroureteronephrosis is noted without obstructing calculus, concerning for distal ureteral obstruction secondary to neoplasm. Aortic Atherosclerosis (ICD10-I70.0). Electronically Signed   By: Lupita Raider M.D.   On: 07/16/2023 11:53   DG Abdomen 1 View Result Date: 07/16/2023 CLINICAL DATA:  Abdominal pain, constipation. EXAM: ABDOMEN - 1 VIEW COMPARISON:  June 07, 2018. FINDINGS: No definite small bowel dilatation is noted. Moderately dilated and  air-filled colon is noted most consistent with ileus. Or clips are noted in the pelvis. IMPRESSION: Moderately dilated and air-filled colon is noted concerning for ileus. Electronically Signed   By: Lupita Raider M.D.   On: 07/16/2023 10:56   CT CHEST ABDOMEN PELVIS W CONTRAST Result Date: 07/10/2023 CLINICAL DATA:  History of recurrent ovarian cancer, follow-up. * Tracking Code: BO * EXAM: CT CHEST, ABDOMEN, AND PELVIS WITH CONTRAST TECHNIQUE: Multidetector CT imaging of the chest, abdomen and pelvis was performed following the standard protocol during bolus administration of intravenous contrast. RADIATION DOSE REDUCTION: This exam was performed according to the departmental dose-optimization program which includes automated exposure control, adjustment of the mA and/or kV according to patient size and/or use of iterative reconstruction technique. CONTRAST:  OMNIPAQUE IOHEXOL 300 MG/ML  SOLN COMPARISON:  Multiple priors including CT April 10, 2023 FINDINGS: CT CHEST FINDINGS Cardiovascular: Right chest Port-A-Cath with tip near the superior cavoatrial junction. Normal caliber thoracic aorta. Normal size heart. Mediastinum/Nodes: No suspicious thyroid nodule. No pathologically enlarged mediastinal, hilar or axillary lymph nodes. Esophagus is grossly unremarkable. Lungs/Pleura: Biapical pleuroparenchymal scarring. 4 mm left lower lobe pulmonary nodule on image 62/6 stable from coronary CT Oct 06, 2021 suggestive of a benign finding. No new suspicious pulmonary nodules or masses. Musculoskeletal: No aggressive lytic or blastic lesion of bone. CT ABDOMEN PELVIS FINDINGS Hepatobiliary: New heterogeneous 2.3 cm subcapsular segment VI hepatic lesion on image 57/2 and 14 mm segment VI/VII hepatic lesion on image 55/2. Gallbladder is unremarkable.  No biliary ductal dilation. Pancreas: No pancreatic ductal dilation or evidence of acute inflammation. Spleen: No splenomegaly. Adrenals/Urinary Tract: No  suspicious adrenal nodule/mass. Hydronephrosis. Nonobstructive left renal stones measure up to 6 mm. Kidneys demonstrate symmetric enhancement. Mild symmetric wall thickening of an incompletely distended urinary bladder. Stomach/Bowel: Stomach is unremarkable for degree of distension. No pathologic dilation of small or large bowel. No evidence of acute bowel inflammation. Vascular/Lymphatic: Normal caliber abdominal aorta. Aortic atherosclerosis. Smooth IVC contours. The portal, splenic and superior mesenteric veins are patent. Increased size of the low-density left external iliac lymph node now measuring 14 mm in short axis on image 93/2 previously 7 mm. Reproductive: Increased size of the heterogeneous lesion in the arising from the right vaginal cuff now measuring 4.9 x 3.4 cm on image 105/2 previously 2.5 x 1.9 cm. Left ovarian cystic lesion with persistent enhancing soft tissue along the margin of the lesion for instance on image  101/2 now in total measures 5.3 x 5.1 cm on image 108/2 previously 4.2 x 4.2 cm. Other: Increased abdominopelvic free fluid with a new loculated collection in the pelvis. Few new scattered soft tissue nodules in the omentum/peritoneum for instance along the right pericolic gutter measuring 7 mm on image 93/2 and in the left upper quadrant measuring 5 mm on image 53/2. Musculoskeletal: No aggressive lytic or blastic lesion of bone. IMPRESSION: 1. Increased size of the heterogeneous lesion arising from the right vaginal cuff, increased size of the left ovarian cystic lesion with persistent enhancing soft tissue along the margin of the lesion, and increased size of the low-density left external iliac lymph node, compatible with worsening disease. 2. Increased abdominopelvic free fluid with a new loculated collection in the pelvis and a few new scattered soft tissue nodules in the omentum/peritoneum, compatible with peritoneal carcinomatosis. 3. New heterogeneous 2.3 cm subcapsular segment  VI hepatic lesion and 14 mm segment VI/VII hepatic lesion, compatible with hepatic metastatic disease. 4. No evidence of metastatic disease in the chest. 5. Nonobstructive left renal stones measure up to 6 mm. 6. Mild symmetric wall thickening of an incompletely distended urinary bladder. Correlate with urinalysis to exclude cystitis. 7.  Aortic Atherosclerosis (ICD10-I70.0). Electronically Signed   By: Maudry Mayhew M.D.   On: 07/10/2023 11:39     Discharge Exam: Vitals:   07/24/23 0503 07/24/23 0525  BP: (!) 164/75 (!) 148/78  Pulse: 66   Resp: 15   Temp: 98.1 F (36.7 C)   SpO2: 98%    Vitals:   07/23/23 1152 07/23/23 2133 07/24/23 0503 07/24/23 0525  BP: 139/67 (!) 149/78 (!) 164/75 (!) 148/78  Pulse: 85 77 66   Resp: 18 16 15    Temp: 98.2 F (36.8 C) 98.3 F (36.8 C) 98.1 F (36.7 C)   TempSrc: Oral Oral Oral   SpO2: 99% 99% 98%   Weight:      Height:        General: Pt is alert, awake, not in acute distress Cardiovascular: RRR, S1/S2 +, no rubs, no gallops Respiratory: CTA bilaterally, no wheezing, no rhonchi Abdominal: Soft, nondistended, very very mild generalized abdominal tenderness, colostomy bag with stool. Extremities: no edema, no cyanosis    The results of significant diagnostics from this hospitalization (including imaging, microbiology, ancillary and laboratory) are listed below for reference.     Microbiology: No results found for this or any previous visit (from the past 240 hours).   Labs: BNP (last 3 results) No results for input(s): "BNP" in the last 8760 hours. Basic Metabolic Panel: Recent Labs  Lab 07/18/23 1015 07/19/23 1106 07/20/23 0441 07/20/23 0444 07/21/23 0530 07/22/23 0308 07/22/23 0309 07/23/23 0328 07/23/23 0330  NA 145 149*  --  140 138  --  135  --   --   K 3.4* 3.2*  --  3.3* 3.0*  --  3.0* 4.0  --   CL 113* 115*  --  106 104  --  102  --   --   CO2 23 23  --  24 26  --  25  --   --   GLUCOSE 129* 106*  --  112* 87   --  100*  --   --   BUN 8 7*  --  11 13  --  7*  --   --   CREATININE 0.47 0.53  --  0.51 0.50  --  0.40*  --  0.48  CALCIUM 8.2* 8.6*  --  8.1* 8.0*  --  7.8*  --   --   MG 1.8 1.7 2.0  --   --  1.3*  --  1.8  --   PHOS  --   --   --   --   --  2.7  --  2.5  --    Liver Function Tests: Recent Labs  Lab 07/18/23 1015 07/20/23 0444  AST 20 23  ALT 16 16  ALKPHOS 111 87  BILITOT 0.6 1.1  PROT 5.9* 5.8*  ALBUMIN 2.7* 2.7*   No results for input(s): "LIPASE", "AMYLASE" in the last 168 hours. No results for input(s): "AMMONIA" in the last 168 hours. CBC: Recent Labs  Lab 07/18/23 1015 07/19/23 1106 07/20/23 0444 07/21/23 0530 07/22/23 0309  WBC 5.1 5.2 6.6 7.1 6.7  NEUTROABS  --  3.5  --  4.6 4.1  HGB 10.0* 9.8* 9.9* 9.4* 9.2*  HCT 31.0* 30.2* 30.1* 28.5* 29.0*  MCV 98.4 99.3 96.8 96.6 99.3  PLT 262 242 253 265 237   Cardiac Enzymes: No results for input(s): "CKTOTAL", "CKMB", "CKMBINDEX", "TROPONINI" in the last 168 hours. BNP: Invalid input(s): "POCBNP" CBG: No results for input(s): "GLUCAP" in the last 168 hours. D-Dimer No results for input(s): "DDIMER" in the last 72 hours. Hgb A1c No results for input(s): "HGBA1C" in the last 72 hours. Lipid Profile No results for input(s): "CHOL", "HDL", "LDLCALC", "TRIG", "CHOLHDL", "LDLDIRECT" in the last 72 hours. Thyroid function studies No results for input(s): "TSH", "T4TOTAL", "T3FREE", "THYROIDAB" in the last 72 hours.  Invalid input(s): "FREET3" Anemia work up No results for input(s): "VITAMINB12", "FOLATE", "FERRITIN", "TIBC", "IRON", "RETICCTPCT" in the last 72 hours. Urinalysis    Component Value Date/Time   COLORURINE STRAW (A) 02/10/2020 1053   APPEARANCEUR CLEAR 02/10/2020 1053   LABSPEC 1.005 02/10/2020 1053   PHURINE 7.0 02/10/2020 1053   GLUCOSEU NEGATIVE 02/10/2020 1053   HGBUR NEGATIVE 02/10/2020 1053   BILIRUBINUR NEGATIVE 02/10/2020 1053   KETONESUR NEGATIVE 02/10/2020 1053   PROTEINUR NEGATIVE  09/06/2022 0759   UROBILINOGEN 0.2 08/04/2013 1220   NITRITE NEGATIVE 02/10/2020 1053   LEUKOCYTESUR NEGATIVE 02/10/2020 1053   Sepsis Labs Recent Labs  Lab 07/19/23 1106 07/20/23 0444 07/21/23 0530 07/22/23 0309  WBC 5.2 6.6 7.1 6.7   Microbiology No results found for this or any previous visit (from the past 240 hours).  FURTHER DISCHARGE INSTRUCTIONS:   Get Medicines reviewed and adjusted: Please take all your medications with you for your next visit with your Primary MD   Laboratory/radiological data: Please request your Primary MD to go over all hospital tests and procedure/radiological results at the follow up, please ask your Primary MD to get all Hospital records sent to his/her office.   In some cases, they will be blood work, cultures and biopsy results pending at the time of your discharge. Please request that your primary care M.D. goes through all the records of your hospital data and follows up on these results.   Also Note the following: If you experience worsening of your admission symptoms, develop shortness of breath, life threatening emergency, suicidal or homicidal thoughts you must seek medical attention immediately by calling 911 or calling your MD immediately  if symptoms less severe.   You must read complete instructions/literature along with all the possible adverse reactions/side effects for all the Medicines you take and that have been prescribed to you. Take any new Medicines after you have completely understood and accpet all the possible adverse reactions/side  effects.    Do not drive when taking Pain medications or sleeping medications (Benzodaizepines)   Do not take more than prescribed Pain, Sleep and Anxiety Medications. It is not advisable to combine anxiety,sleep and pain medications without talking with your primary care practitioner   Special Instructions: If you have smoked or chewed Tobacco  in the last 2 yrs please stop smoking, stop any  regular Alcohol  and or any Recreational drug use.   Wear Seat belts while driving.   Please note: You were cared for by a hospitalist during your hospital stay. Once you are discharged, your primary care physician will handle any further medical issues. Please note that NO REFILLS for any discharge medications will be authorized once you are discharged, as it is imperative that you return to your primary care physician (or establish a relationship with a primary care physician if you do not have one) for your post hospital discharge needs so that they can reassess your need for medications and monitor your lab values  Time coordinating discharge: Over 30 minutes  SIGNED:   Hughie Closs, MD  [facility] 07/24/2023, 10:06 AM *Please note that this is a verbal dictation therefore any spelling or grammatical errors are due to the "[employer] One" system interpretation. If 7PM-7AM, please contact night-coverage [email or web address]

## 2023-07-24 NOTE — Progress Notes (Signed)
 Stacy Rivas   DOB:Jan 25, 1956   WU#:[id number]    ASSESSMENT & PLAN:  Recurrent ovarian cancer The patient has significant disease progression since her last imaging She is recovering well status post diversion colostomy I will schedule outpatient chemotherapy to resume next week   Bowel obstruction, resolved  Moderate protein calorie malnutrition Due to poor oral intake for the past few weeks Recommend frequent small meals and high-protein intake as tolerated   Anemia of chronic illness Monitor closely   Artis Delay, MD 07/24/2023 10:43 AM  Subjective:  She is doing well over the weekend She has pain at the surgical site, controlled with oxycodone She is able to eat small bites of food No nausea vomiting Colostomy appears to be functioning well We discussed plan of care after discharge from the hospital  Objective:  Vitals:   07/24/23 0503 07/24/23 0525  BP: (!) 164/75 (!) 148/78  Pulse: 66   Resp: 15   Temp: 98.1 F (36.7 C)   SpO2: 98%      Intake/Output Summary (Last 24 hours) at 07/24/2023 1043 Last data filed at 07/23/2023 2100 Gross per 24 hour  Intake 240 ml  Output 125 ml  Net 115 ml    GENERAL:alert, no distress and comfortable    Labs:  Recent Labs    07/17/23 0411 07/18/23 1015 07/19/23 1106 07/20/23 0444 07/21/23 0530 07/22/23 0309 07/23/23 0328 07/23/23 0330  NA 139 145   < > 140 138 135  --   --   K 3.6 3.4*   < > 3.3* 3.0* 3.0* 4.0  --   CL 106 113*   < > 106 104 102  --   --   CO2 24 23   < > 24 26 25   --   --   GLUCOSE 152* 129*   < > 112* 87 100*  --   --   BUN 14 8   < > 11 13 7*  --   --   CREATININE 0.47 0.47   < > 0.51 0.50 0.40*  --  0.48  CALCIUM 8.1* 8.2*   < > 8.1* 8.0* 7.8*  --   --   GFRNONAA >60 >60   < > >60 >60 >60  --  >60  PROT 5.8* 5.9*  --  5.8*  --   --   --   --   ALBUMIN 2.8* 2.7*  --  2.7*  --   --   --   --   AST 21 20  --  23  --   --   --   --   ALT 18 16  --  16  --   --   --   --   ALKPHOS 124 111  --   87  --   --   --   --   BILITOT 0.4 0.6  --  1.1  --   --   --   --    < > = values in this interval not displayed.    Studies:  DG Abd 1 View Result Date: 07/16/2023 CLINICAL DATA:  Abdominal pain EXAM: ABDOMEN - 1 VIEW COMPARISON:  Abdominal radiographs 07/16/2023 FINDINGS: NG tube in stomach.  Side port below the GE junction. Mild improvement in gaseous distention of colon. For example transverse colon measures 5.7 cm compared to 6.1. Cecum remains distended. No dilated loops of small bowel. IMPRESSION: 1. NG tube in stomach. 2. Some improvement in gaseous distention of the  colon. Electronically Signed   By: Genevive Bi M.D.   On: 07/16/2023 17:48   CT ABDOMEN PELVIS WO CONTRAST Result Date: 07/16/2023 CLINICAL DATA:  Generalized abdominal pain for 4 days. History of ovarian cancer. EXAM: CT ABDOMEN AND PELVIS WITHOUT CONTRAST TECHNIQUE: Multidetector CT imaging of the abdomen and pelvis was performed following the standard protocol without IV contrast. RADIATION DOSE REDUCTION: This exam was performed according to the departmental dose-optimization program which includes automated exposure control, adjustment of the mA and/or kV according to patient size and/or use of iterative reconstruction technique. COMPARISON:  July 07, 2023 FINDINGS: Lower chest: No acute abnormality. Hepatobiliary: No focal liver abnormality is seen. No gallstones, gallbladder wall thickening, or biliary dilatation. Pancreas: Unremarkable. No pancreatic ductal dilatation or surrounding inflammatory changes. Spleen: Normal in size without focal abnormality. Adrenals/Urinary Tract: Adrenal glands appear normal. Nonobstructive left nephrolithiasis is noted. Mild bilateral hydroureteronephrosis is noted without obstructing calculus. This is concerning for distal ureteral obstruction. Urinary bladder is decompressed. Stomach/Bowel: Stomach and appendix are unremarkable. No significant small bowel dilatation is noted.  However, there is severe colonic dilatation which appears to be due to distal sigmoid colon obstruction, most likely due to external compression from adjacent peritoneal implant or malignancy. Vascular/Lymphatic: Aortic atherosclerosis. No significant adenopathy is noted currently. Reproductive: Status post hysterectomy. 5.3 x 3.0 cm left adnexal cystic lesion is noted which is not significantly changed compared to prior exam. 11.5 x 9.1 cm complex cystic abnormality is noted in the pelvis with large peripheral solid components. This is concerning for cystic ovarian malignancy or metastatic disease or possibly loculated ascites and adjacent peritoneal carcinomatosis. Other: No definite hernia is noted. Musculoskeletal: No acute or significant osseous findings. IMPRESSION: Interval development of severe diffuse colonic dilatation with complete obstruction at the level of the distal sigmoid colon, most likely due to external compression from adjacent malignancy or peritoneal implant given history of ovarian carcinoma. There is noted 11.5 x 9.1 cm complex cystic abnormality in the pelvis with large peripheral solid components which is concerning for cystic ovarian malignancy or metastatic disease, or possibly loculated ascites with adjacent peritoneal carcinomatosis. Nonobstructive left nephrolithiasis is noted. Mild bilateral hydroureteronephrosis is noted without obstructing calculus, concerning for distal ureteral obstruction secondary to neoplasm. Aortic Atherosclerosis (ICD10-I70.0). Electronically Signed   By: Lupita Raider M.D.   On: 07/16/2023 11:53   DG Abdomen 1 View Result Date: 07/16/2023 CLINICAL DATA:  Abdominal pain, constipation. EXAM: ABDOMEN - 1 VIEW COMPARISON:  June 07, 2018. FINDINGS: No definite small bowel dilatation is noted. Moderately dilated and air-filled colon is noted most consistent with ileus. Or clips are noted in the pelvis. IMPRESSION: Moderately dilated and air-filled colon  is noted concerning for ileus. Electronically Signed   By: Lupita Raider M.D.   On: 07/16/2023 10:56   CT CHEST ABDOMEN PELVIS W CONTRAST Result Date: 07/10/2023 CLINICAL DATA:  History of recurrent ovarian cancer, follow-up. * Tracking Code: BO * EXAM: CT CHEST, ABDOMEN, AND PELVIS WITH CONTRAST TECHNIQUE: Multidetector CT imaging of the chest, abdomen and pelvis was performed following the standard protocol during bolus administration of intravenous contrast. RADIATION DOSE REDUCTION: This exam was performed according to the departmental dose-optimization program which includes automated exposure control, adjustment of the mA and/or kV according to patient size and/or use of iterative reconstruction technique. CONTRAST:  OMNIPAQUE IOHEXOL 300 MG/ML  SOLN COMPARISON:  Multiple priors including CT April 10, 2023 FINDINGS: CT CHEST FINDINGS Cardiovascular: Right chest Port-A-Cath with tip near the  superior cavoatrial junction. Normal caliber thoracic aorta. Normal size heart. Mediastinum/Nodes: No suspicious thyroid nodule. No pathologically enlarged mediastinal, hilar or axillary lymph nodes. Esophagus is grossly unremarkable. Lungs/Pleura: Biapical pleuroparenchymal scarring. 4 mm left lower lobe pulmonary nodule on image 62/6 stable from coronary CT Oct 06, 2021 suggestive of a benign finding. No new suspicious pulmonary nodules or masses. Musculoskeletal: No aggressive lytic or blastic lesion of bone. CT ABDOMEN PELVIS FINDINGS Hepatobiliary: New heterogeneous 2.3 cm subcapsular segment VI hepatic lesion on image 57/2 and 14 mm segment VI/VII hepatic lesion on image 55/2. Gallbladder is unremarkable.  No biliary ductal dilation. Pancreas: No pancreatic ductal dilation or evidence of acute inflammation. Spleen: No splenomegaly. Adrenals/Urinary Tract: No suspicious adrenal nodule/mass. Hydronephrosis. Nonobstructive left renal stones measure up to 6 mm. Kidneys demonstrate symmetric enhancement. Mild  symmetric wall thickening of an incompletely distended urinary bladder. Stomach/Bowel: Stomach is unremarkable for degree of distension. No pathologic dilation of small or large bowel. No evidence of acute bowel inflammation. Vascular/Lymphatic: Normal caliber abdominal aorta. Aortic atherosclerosis. Smooth IVC contours. The portal, splenic and superior mesenteric veins are patent. Increased size of the low-density left external iliac lymph node now measuring 14 mm in short axis on image 93/2 previously 7 mm. Reproductive: Increased size of the heterogeneous lesion in the arising from the right vaginal cuff now measuring 4.9 x 3.4 cm on image 105/2 previously 2.5 x 1.9 cm. Left ovarian cystic lesion with persistent enhancing soft tissue along the margin of the lesion for instance on image 101/2 now in total measures 5.3 x 5.1 cm on image 108/2 previously 4.2 x 4.2 cm. Other: Increased abdominopelvic free fluid with a new loculated collection in the pelvis. Few new scattered soft tissue nodules in the omentum/peritoneum for instance along the right pericolic gutter measuring 7 mm on image 93/2 and in the left upper quadrant measuring 5 mm on image 53/2. Musculoskeletal: No aggressive lytic or blastic lesion of bone. IMPRESSION: 1. Increased size of the heterogeneous lesion arising from the right vaginal cuff, increased size of the left ovarian cystic lesion with persistent enhancing soft tissue along the margin of the lesion, and increased size of the low-density left external iliac lymph node, compatible with worsening disease. 2. Increased abdominopelvic free fluid with a new loculated collection in the pelvis and a few new scattered soft tissue nodules in the omentum/peritoneum, compatible with peritoneal carcinomatosis. 3. New heterogeneous 2.3 cm subcapsular segment VI hepatic lesion and 14 mm segment VI/VII hepatic lesion, compatible with hepatic metastatic disease. 4. No evidence of metastatic disease in the  chest. 5. Nonobstructive left renal stones measure up to 6 mm. 6. Mild symmetric wall thickening of an incompletely distended urinary bladder. Correlate with urinalysis to exclude cystitis. 7.  Aortic Atherosclerosis (ICD10-I70.0). Electronically Signed   By: Maudry Mayhew M.D.   On: 07/10/2023 11:39

## 2023-07-24 NOTE — TOC Transition Note (Signed)
 Transition of Care [facility]) - Discharge Note   Patient Details  Name: Stacy Rivas MRN: [id number] Date of Birth: 09-29-1955  Transition of Care [facility]) CM/SW Contact:  Larrie Kass, LCSW Phone Number: 07/24/2023, 10:24 AM   Clinical Narrative:     CSW spoke with pt regarding recommendations for home health services. Pt is agreeable to services, pt has no preference in DME company. [facility] was able to accept pt for [facility] PT and RN. No further TOC needs TOC sign off.    Final next level of care: Home w Home Health Services Barriers to Discharge: No Barriers Identified   Patient Goals and CMS Choice Patient states their goals for this hospitalization and ongoing recovery are:: return home with home health CMS Medicare.gov Compare Post Acute Care list provided to:: Patient Choice offered to / list presented to : Patient      Discharge Placement                    Patient and family notified of of transfer: 07/24/23  Discharge Plan and Services Additional resources added to the After Visit Summary for                                       Social Drivers of Health (SDOH) Interventions SDOH Screenings   Food Insecurity: No Food Insecurity (07/16/2023)  Housing: Low Risk  (07/16/2023)  Transportation Needs: No Transportation Needs (07/16/2023)  Utilities: Not At Risk (07/16/2023)  Social Connections: Moderately Isolated (07/16/2023)  Tobacco Use: Medium Risk (07/19/2023)     Readmission Risk Interventions    07/17/2023    9:26 AM  Readmission Risk Prevention Plan  Transportation Screening Complete  PCP or Specialist Appt within 5-7 Days Complete  Home Care Screening Complete

## 2023-07-24 NOTE — Plan of Care (Signed)

## 2023-07-24 NOTE — Plan of Care (Signed)
 Palliative-   Chart reviewed. Stacy Rivas is stable and likely to d/c home today or tomorrow.  Referral has been placed for appointment with Royal Hawthorn, NP with Palliative at [facility]   Ocie Bob, AGNP-C Palliative Medicine  No charge

## 2023-07-26 ENCOUNTER — Other Ambulatory Visit: Payer: Self-pay

## 2023-07-27 ENCOUNTER — Other Ambulatory Visit: Payer: Self-pay

## 2023-07-27 NOTE — Progress Notes (Signed)

## 2023-07-28 ENCOUNTER — Ambulatory Visit (HOSPITAL_COMMUNITY)
Admission: RE | Admit: 2023-07-28 | Discharge: 2023-07-28 | Disposition: A | Discharge status: Home / Self Care | Source: Ambulatory Visit | Attending: Nurse Practitioner | Admitting: Nurse Practitioner

## 2023-07-28 DIAGNOSIS — K9400 Colostomy complication, unspecified: Secondary | ICD-10-CM

## 2023-07-28 DIAGNOSIS — L24B3 Irritant contact dermatitis related to fecal or urinary stoma or fistula: Secondary | ICD-10-CM | POA: Insufficient documentation

## 2023-07-28 DIAGNOSIS — Z933 Colostomy status: Secondary | ICD-10-CM | POA: Diagnosis present

## 2023-07-28 NOTE — Discharge Instructions (Signed)
 2 piece convex pouch with barrier ring  Recommend filtered pouch Consider ostomy belt Secured barrier with barrier strips Will enroll with [facility]

## 2023-07-28 NOTE — Progress Notes (Signed)
 [facility]   Reason for visit:  LUQ colostomy, issues with gas and pouch losing seal.  Prefers 2 piece pouch HPI:  Diverticulitis with end colostomy, retention bridge in place Past Medical History:  Diagnosis Date  . Anxiety state, unspecified   . Diverticulosis   . Esophageal reflux   . Family history of breast cancer   . Family history of lymphoma   . Family history of stomach cancer   . Family history of uterine cancer   . Fibroid tumor   . Headache    due to allergies   . Hiatal hernia   . Hyperlipidemia   . IBS (irritable bowel syndrome)   . Internal hemorrhoids   . Nonspecific elevation of levels of transaminase or lactic acid dehydrogenase (LDH)   . ovarian ca dx'd 11/2018  . Renal stone 07/2013  . Tubulovillous adenoma of colon   . Varicose veins    superficail thrombophlebitis (left LE)   Family History  Problem Relation Age of Onset  . Lymphoma Father        dx 86s  . Heart disease Father   . Hypertension Father   . Hypertension Mother   . Hyperlipidemia Mother   . Breast cancer Mother 70  . Diabetes Maternal Grandmother   . Uterine cancer Maternal Grandmother        dx 84s  . Stomach cancer Paternal Grandfather   . Breast cancer Other        grandmother's sisters, both dx 66s  . Colon cancer Neg Hx    Allergies  Allergen Reactions  . Carboplatin Other (See Comments)    Chest tightness/burning, flushing.  See Progress Note from 12/29/2022.  . Skin Adhesives [Cyanoacrylate] Hives  . Shrimp [Shellfish Allergy] Itching  . Sulfasalazine Nausea And Vomiting  . Sulfonamide Derivatives Nausea Only  . Telithromycin     Other Reaction(s): severe dizziness   Current Outpatient Medications  Medication Sig Dispense Refill Last Dose/Taking  . acetaminophen (TYLENOL) 500 MG tablet Take 500 mg by mouth every 6 (six) hours as needed for mild pain (pain score 1-3) or moderate pain (pain score 4-6).     [name] Kitchen ALPRAZolam (XANAX) 0.25 MG tablet Take 1 tablet  (0.25 mg total) by mouth 2 (two) times daily as needed for anxiety or sleep. 60 tablet 0   . Cholecalciferol (VITAMIN D3) 25 MCG (1000 UT) CAPS Take 1,000 Units by mouth daily.      [name] Kitchen esomeprazole (NEXIUM) 20 MG capsule Take 20 mg by mouth daily.      . famotidine (PEPCID) 20 MG tablet Take 20 mg by mouth as needed for heartburn or indigestion.     . lidocaine-prilocaine (EMLA) cream Apply topically as needed. 30 g 1   . magnesium oxide (MAG-OX) 400 (240 Mg) MG tablet Take 1 tablet (400 mg total) by mouth daily. 30 tablet 1   . Multiple Vitamins-Minerals (AIRBORNE) CHEW Chew 1 each by mouth in the morning, at noon, and at bedtime.     . Multiple Vitamins-Minerals (MULTI FOR HER PO) Take 1 tablet by mouth daily.     . ondansetron (ZOFRAN-ODT) 4 MG disintegrating tablet Take 1 tablet (4 mg total) by mouth every 8 (eight) hours as needed for nausea or vomiting. 20 tablet 0   . oxyCODONE (OXY IR/ROXICODONE) 5 MG immediate release tablet Take 1 tablet (5 mg total) by mouth every 6 (six) hours as needed for severe pain (pain score 7-10) or moderate pain (pain score 4-6).  30 tablet 0   . polyethylene glycol (MIRALAX / GLYCOLAX) 17 g packet Take 17 g by mouth 2 (two) times daily. 14 each 0   . prochlorperazine (COMPAZINE) 10 MG tablet Take 1 tablet by mouth every 6 hours as needed (Nausea or vomiting). 30 tablet 1    No current facility-administered medications for this encounter.   ROS  Review of Systems  Constitutional:  Positive for fatigue.  Gastrointestinal:  Positive for rectal pain.       LLQ colostomy  Skin:  Positive for color change and rash.  Psychiatric/Behavioral:  The patient is nervous/anxious.   All other systems reviewed and are negative. Vital signs:  BP 139/74 (BP Location: Right Arm)   Pulse 93   Temp 98.4 F (36.9 C) (Oral)   Resp 18   SpO2 99%  Exam:  Physical Exam Vitals reviewed.  Constitutional:      Appearance: Normal appearance.  Abdominal:     Palpations:  Abdomen is soft.  Musculoskeletal:     Comments: weakness  Skin:    General: Skin is warm and dry.     Findings: Erythema and rash present.  Neurological:     Mental Status: She is alert and oriented to person, place, and time. Mental status is at baseline.  Psychiatric:        Behavior: Behavior normal.    Stoma type/location:  LLQ colostomy Stomal assessment/size:  1 5/8" pink and moist flush stoma Peristomal assessment:  retention in place.  Treatment options for stomal/peristomal skin: stoma powder and skin prep to irritation, barrier ring, convex pouch with belt, and barrier strips Output: soft brown stool Ostomy pouching: 2pc. Convex pouch with barrier ring, implement filtered pouch  barrier strips to perimeter and ostomy belt.   Education provided:  Pouch change performed, demonstrated belt, barrier strips    Impression/dx  Erythema, irritation to peristomal skin.   Colostomy complication Discussion  New accessories to improve seal on ostomy pouch Plan  Will enroll with [facility].     Visit time: 55 minutes.   Mike Gip FNP-BC

## 2023-08-03 ENCOUNTER — Encounter: Payer: Self-pay | Admitting: Hematology and Oncology

## 2023-08-03 ENCOUNTER — Inpatient Hospital Stay: Admitting: Licensed Clinical Social Worker

## 2023-08-03 ENCOUNTER — Inpatient Hospital Stay: Attending: Gynecologic Oncology

## 2023-08-03 ENCOUNTER — Other Ambulatory Visit (HOSPITAL_COMMUNITY): Payer: Self-pay

## 2023-08-03 ENCOUNTER — Ambulatory Visit: Payer: Medicare Other | Admitting: Hematology and Oncology

## 2023-08-03 ENCOUNTER — Inpatient Hospital Stay (HOSPITAL_BASED_OUTPATIENT_CLINIC_OR_DEPARTMENT_OTHER): Admitting: Hematology and Oncology

## 2023-08-03 ENCOUNTER — Inpatient Hospital Stay

## 2023-08-03 ENCOUNTER — Other Ambulatory Visit: Payer: Self-pay

## 2023-08-03 VITALS — BP 133/77 | HR 82 | Temp 98.2°F | Resp 18

## 2023-08-03 VITALS — BP 140/78 | HR 104 | Temp 97.8°F | Resp 18 | Ht 62.0 in | Wt 133.6 lb

## 2023-08-03 DIAGNOSIS — Z8543 Personal history of malignant neoplasm of ovary: Secondary | ICD-10-CM | POA: Insufficient documentation

## 2023-08-03 DIAGNOSIS — C786 Secondary malignant neoplasm of retroperitoneum and peritoneum: Secondary | ICD-10-CM | POA: Diagnosis not present

## 2023-08-03 DIAGNOSIS — R300 Dysuria: Secondary | ICD-10-CM

## 2023-08-03 DIAGNOSIS — K9400 Colostomy complication, unspecified: Secondary | ICD-10-CM | POA: Insufficient documentation

## 2023-08-03 DIAGNOSIS — Z170 Estrogen receptor positive status [ER+]: Secondary | ICD-10-CM | POA: Diagnosis not present

## 2023-08-03 DIAGNOSIS — Z87891 Personal history of nicotine dependence: Secondary | ICD-10-CM | POA: Diagnosis not present

## 2023-08-03 DIAGNOSIS — C562 Malignant neoplasm of left ovary: Secondary | ICD-10-CM | POA: Diagnosis not present

## 2023-08-03 DIAGNOSIS — R630 Anorexia: Secondary | ICD-10-CM | POA: Diagnosis not present

## 2023-08-03 DIAGNOSIS — G893 Neoplasm related pain (acute) (chronic): Secondary | ICD-10-CM | POA: Insufficient documentation

## 2023-08-03 DIAGNOSIS — T451X5A Adverse effect of antineoplastic and immunosuppressive drugs, initial encounter: Secondary | ICD-10-CM

## 2023-08-03 DIAGNOSIS — N390 Urinary tract infection, site not specified: Secondary | ICD-10-CM | POA: Insufficient documentation

## 2023-08-03 DIAGNOSIS — D6481 Anemia due to antineoplastic chemotherapy: Secondary | ICD-10-CM

## 2023-08-03 DIAGNOSIS — Z5111 Encounter for antineoplastic chemotherapy: Secondary | ICD-10-CM | POA: Diagnosis present

## 2023-08-03 DIAGNOSIS — Z79633 Long term (current) use of mitotic inhibitor: Secondary | ICD-10-CM | POA: Diagnosis not present

## 2023-08-03 DIAGNOSIS — L24B3 Irritant contact dermatitis related to fecal or urinary stoma or fistula: Secondary | ICD-10-CM | POA: Insufficient documentation

## 2023-08-03 DIAGNOSIS — Z7189 Other specified counseling: Secondary | ICD-10-CM

## 2023-08-03 LAB — CBC WITH DIFFERENTIAL (CANCER CENTER ONLY)
Abs Immature Granulocytes: 0.04 10*3/uL (ref 0.00–0.07)
Basophils Absolute: 0 10*3/uL (ref 0.0–0.1)
Basophils Relative: 0 %
Eosinophils Absolute: 0.1 10*3/uL (ref 0.0–0.5)
Eosinophils Relative: 1 %
HCT: 28.2 % — ABNORMAL LOW (ref 36.0–46.0)
Hemoglobin: 9.3 g/dL — ABNORMAL LOW (ref 12.0–15.0)
Immature Granulocytes: 0 %
Lymphocytes Relative: 9 %
Lymphs Abs: 0.9 10*3/uL (ref 0.7–4.0)
MCH: 31 pg (ref 26.0–34.0)
MCHC: 33 g/dL (ref 30.0–36.0)
MCV: 94 fL (ref 80.0–100.0)
Monocytes Absolute: 0.9 10*3/uL (ref 0.1–1.0)
Monocytes Relative: 8 %
Neutro Abs: 8.9 10*3/uL — ABNORMAL HIGH (ref 1.7–7.7)
Neutrophils Relative %: 82 %
Platelet Count: 366 10*3/uL (ref 150–400)
RBC: 3 MIL/uL — ABNORMAL LOW (ref 3.87–5.11)
RDW: 14.4 % (ref 11.5–15.5)
WBC Count: 10.8 10*3/uL — ABNORMAL HIGH (ref 4.0–10.5)
nRBC: 0 % (ref 0.0–0.2)

## 2023-08-03 LAB — URINALYSIS, COMPLETE (UACMP) WITH MICROSCOPIC
Bacteria, UA: NONE SEEN
Bilirubin Urine: NEGATIVE
Glucose, UA: NEGATIVE mg/dL
Ketones, ur: NEGATIVE mg/dL
Leukocytes,Ua: NEGATIVE
Nitrite: NEGATIVE
Protein, ur: NEGATIVE mg/dL
Specific Gravity, Urine: 1.008 (ref 1.005–1.030)
pH: 6 (ref 5.0–8.0)

## 2023-08-03 LAB — CMP (CANCER CENTER ONLY)
ALT: 13 U/L (ref 0–44)
AST: 21 U/L (ref 15–41)
Albumin: 3.7 g/dL (ref 3.5–5.0)
Alkaline Phosphatase: 315 U/L — ABNORMAL HIGH (ref 38–126)
Anion gap: 7 (ref 5–15)
BUN: 10 mg/dL (ref 8–23)
CO2: 29 mmol/L (ref 22–32)
Calcium: 9 mg/dL (ref 8.9–10.3)
Chloride: 102 mmol/L (ref 98–111)
Creatinine: 0.4 mg/dL — ABNORMAL LOW (ref 0.44–1.00)
GFR, Estimated: >60 mL/min (ref >60)
Glucose, Bld: 118 mg/dL — ABNORMAL HIGH (ref 70–99)
Potassium: 3 mmol/L — ABNORMAL LOW (ref 3.5–5.1)
Sodium: 138 mmol/L (ref 135–145)
Total Bilirubin: 1 mg/dL (ref 0.0–1.2)
Total Protein: 7.3 g/dL (ref 6.5–8.1)

## 2023-08-03 MED ORDER — DEXAMETHASONE SODIUM PHOSPHATE 10 MG/ML IJ SOLN
10.0000 mg | Freq: Once | INTRAMUSCULAR | Status: AC
  Administered 2023-08-03: 10 mg via INTRAVENOUS

## 2023-08-03 MED ORDER — DEXAMETHASONE 4 MG PO TABS
ORAL_TABLET | ORAL | 6 refills | Status: DC
  Filled 2023-08-03: qty 24, 84d supply, fill #0

## 2023-08-03 MED ORDER — SODIUM CHLORIDE 0.9% FLUSH
10.0000 mL | Freq: Once | INTRAVENOUS | Status: AC
Start: 2023-08-03 — End: 2023-08-03
  Administered 2023-08-03: 10 mL

## 2023-08-03 MED ORDER — SODIUM CHLORIDE 0.9 % IV SOLN
75.0000 mg/m2 | Freq: Once | INTRAVENOUS | Status: AC
  Administered 2023-08-03: 122 mg via INTRAVENOUS
  Filled 2023-08-03: qty 12.2

## 2023-08-03 MED ORDER — OXYCODONE HCL 5 MG PO TABS
5.0000 mg | ORAL_TABLET | Freq: Four times a day (QID) | ORAL | 0 refills | Status: DC | PRN
  Filled 2023-08-03: qty 90, 23d supply, fill #0

## 2023-08-03 MED ORDER — SODIUM CHLORIDE 0.9 % IV SOLN: INTRAVENOUS | Status: DC

## 2023-08-03 NOTE — Assessment & Plan Note (Addendum)
 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 Between 2021-2025, she had multiple disease relapse, progressed on niraparib, paclitaxel, Doxil and bevacizumab, Allergic to carboplatin  Pathology: High grade serous, Her 2 neu 1+ MSI stable, serous, ER 40%, Negative genetics, Folate receptor alpha neg, PD-L1 CPS 1% 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 after presentation with bowel obstruction We will proceed with palliative chemotherapy with single agent docetaxel Due to declining performance status, I will see her next week for further supportive care

## 2023-08-03 NOTE — Progress Notes (Signed)
 [facility] CSW Progress Note  Visual merchandiser met with patient to follow-up on coping. Since last visit, pt has experienced a hospitalization due to progression and blockage and now has a colostomy and is starting a new treatment regimen.  She shared that she is struggling more and has had sad and confused moments but is still trying to remain positive.  A difficult area for her is in her identity as she has strongly valued being a giver and carer rather than a receiver of help and support.  She has also felt a waver in faith.  Pt is being proactive and met with her minister yesterday and has plans to help move toward re-strengthening her faith. She is also working on eating more to regain physical strength.    CSW offered supportive listening and discussed option of counseling via video or in a separate visit as pt did not want to share deeply due to lack of privacy in infusion.  Encouraged pt to call if she decides to have a visit between infusion appts.  CSW also provided information for our on-site chaplain/spiritual care if needed.     Severina Sykora E Edmundo Tedesco, LCSW Clinical Social Worker [facility]

## 2023-08-03 NOTE — Assessment & Plan Note (Addendum)
 Recommend the patient to take pain medicine as needed I will review pain control next visit I refilled her prescription today

## 2023-08-03 NOTE — Assessment & Plan Note (Addendum)
 This is likely due to recent treatment. The patient denies recent history of bleeding such as epistaxis, hematuria or hematochezia. She is asymptomatic from the anemia. I will observe for now.  She does not require transfusion now. I will continue the chemotherapy at current dose without dosage adjustment.  If the anemia gets progressive worse in the future, I might have to delay her treatment or adjust the chemotherapy dose.

## 2023-08-03 NOTE — Progress Notes (Signed)
 Stacy Rivas OFFICE PROGRESS NOTE  Patient Care Team: Stacy Paradise, MD as PCP - General (Internal Medicine) Stacy Ivanoff, RN as Registered Nurse Stacy Miner, MD as Consulting Physician (General Surgery) Stacy Delay, MD as Consulting Physician (Hematology and Oncology) Adolphus Birchwood, MD (Gynecologic Oncology) Beverley Fiedler, MD as Consulting Physician (Gastroenterology)  Assessment & Plan Left ovarian epithelial cancer [facility]) 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 Between 2021-2025, she had multiple disease relapse, progressed on niraparib, paclitaxel, Doxil and bevacizumab, Allergic to carboplatin  Pathology: High grade serous, Her 2 neu 1+ MSI stable, serous, ER 40%, Negative genetics, Folate receptor alpha neg, PD-L1 CPS 1% 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 after presentation with bowel obstruction We will proceed with palliative chemotherapy with single agent docetaxel Due to declining performance status, I will see her next week for further supportive care Anemia due to antineoplastic chemotherapy This is likely due to recent treatment. The patient denies recent history of bleeding such as epistaxis, hematuria or hematochezia. She is asymptomatic from the anemia. I will observe for now.  She does not require transfusion now. I will continue the chemotherapy at current dose without dosage adjustment.  If the anemia gets progressive worse in the future, I might have to Rivas her treatment or adjust the chemotherapy dose.  Anorexia She has anorexia and significant weight loss She has very poor oral intake We discussed importance of frequent small meals and increase activity as tolerated I gave her a  flowsheet to document her oral intake I encouraged her to take nutritional supplement I will see her again next week for further follow-up Cancer associated pain Recommend the patient to take pain medicine as needed I will review pain control next visit I refilled her prescription today  No orders of the defined types were placed in this encounter.    Stacy Delay, MD  INTERVAL HISTORY: she returns for treatment Since discharge from the hospital, her oral intake has been poor Her pain is suboptimally controlled as she has not been taking it the way she should be taking We discussed importance of frequent small meals I review her CBC, CMP and discussed next follow-up I plan to prescribe minor dose adjustment due to recent weight loss  PHYSICAL EXAMINATION: ECOG PERFORMANCE STATUS: 1 - Symptomatic but completely ambulatory  Vitals:   08/03/23 1028  BP: (!) 140/78  Pulse: (!) 104  Resp: 18  Temp: 97.8 F (36.6 C)  SpO2: 100%   Filed Weights   08/03/23 1028  Weight: 133 lb 9.6 oz (60.6 kg)

## 2023-08-03 NOTE — Assessment & Plan Note (Addendum)
 She has anorexia and significant weight loss She has very poor oral intake We discussed importance of frequent small meals and increase activity as tolerated I gave her a flowsheet to document her oral intake I encouraged her to take nutritional supplement I will see her again next week for further follow-up

## 2023-08-03 NOTE — Patient Instructions (Addendum)
 [facility] - A DEPT OF [facility]  Discharge Instructions: Thank you for choosing [facility] to provide your oncology and hematology care.   If you have a lab appointment with the Cancer Center, please go directly to the Cancer Center and check in at the registration area.   Wear comfortable clothing and clothing appropriate for easy access to any Portacath or PICC line.   We strive to give you quality time with your provider. You may need to reschedule your appointment if you arrive late (15 or more minutes).  Arriving late affects you and other patients whose appointments are after yours.  Also, if you miss three or more appointments without notifying the office, you may be dismissed from the clinic at the provider's discretion.      For prescription refill requests, have your pharmacy contact our office and allow 72 hours for refills to be completed.    Today you received the following chemotherapy and/or immunotherapy agents Taxotere   To help prevent nausea and vomiting after your treatment, we encourage you to take your nausea medication as directed.  BELOW ARE SYMPTOMS THAT SHOULD BE REPORTED IMMEDIATELY: *FEVER GREATER THAN 100.4 F (38 C) OR HIGHER *CHILLS OR SWEATING *NAUSEA AND VOMITING THAT IS NOT CONTROLLED WITH YOUR NAUSEA MEDICATION *UNUSUAL SHORTNESS OF BREATH *UNUSUAL BRUISING OR BLEEDING *URINARY PROBLEMS (pain or burning when urinating, or frequent urination) *BOWEL PROBLEMS (unusual diarrhea, constipation, pain near the anus) TENDERNESS IN MOUTH AND THROAT WITH OR WITHOUT PRESENCE OF ULCERS (sore throat, sores in mouth, or a toothache) UNUSUAL RASH, SWELLING OR PAIN  UNUSUAL VAGINAL DISCHARGE OR ITCHING   Items with * indicate a potential emergency and should be followed up as soon as possible or go to the Emergency Department if any problems should occur.  Please show the CHEMOTHERAPY ALERT CARD or IMMUNOTHERAPY ALERT  CARD at check-in to the Emergency Department and triage nurse.  Should you have questions after your visit or need to cancel or reschedule your appointment, please contact [facility] - A DEPT OF [facility]  Dept: [phone]  and follow the prompts.  Office hours are 8:00 a.m. to 4:30 p.m. Monday - Friday. Please note that voicemails left after 4:00 p.m. may not be returned until the following business day.  We are closed weekends and major holidays. You have access to a nurse at all times for urgent questions. Please call the main number to the clinic Dept: [phone] and follow the prompts.   For any non-urgent questions, you may also contact your provider using MyChart. We now offer e-Visits for anyone 80 and older to request care online for non-urgent symptoms. For details visit [email or web address].   Also download the MyChart app! Go to the app store, search "MyChart", open the app, select [place], and log in with your MyChart username and password. Docetaxel Injection What is this medication? DOCETAXEL (doe se TAX el) treats some types of cancer. It works by slowing down the growth of cancer cells. This medicine may be used for other purposes; ask your health care provider or pharmacist if you have questions. COMMON BRAND NAME(S): BEIZRAY, Docefrez, Docivyx, Taxotere What should I tell my care team before I take this medication? They need to know if you have any of these conditions: Kidney disease Liver disease Low white blood cell levels Tingling of the fingers or toes or other nerve disorder An unusual  or allergic reaction to docetaxel, polysorbate 80, other medications, foods, dyes, or preservatives Pregnant or trying to get pregnant Breast-feeding How should I use this medication? This medication is injected into a vein. It is given by your care team in a hospital or clinic setting. Talk to your care team about the use of this medication  in children. Special care may be needed. Overdosage: If you think you have taken too much of this medicine contact a poison control center or emergency room at once. NOTE: This medicine is only for you. Do not share this medicine with others. What if I miss a dose? Keep appointments for follow-up doses. It is important not to miss your dose. Call your care team if you are unable to keep an appointment. What may interact with this medication? Do not take this medication with any of the following: Live virus vaccines This medication may also interact with the following: Certain antibiotics, such as clarithromycin, telithromycin Certain antivirals for HIV or hepatitis Certain medications for fungal infections, such as itraconazole, ketoconazole, voriconazole Grapefruit juice Nefazodone Supplements, such as St. John's wort This list may not describe all possible interactions. Give your health care provider a list of all the medicines, herbs, non-prescription drugs, or dietary supplements you use. Also tell them if you smoke, drink alcohol, or use illegal drugs. Some items may interact with your medicine. What should I watch for while using this medication? This medication may make you feel generally unwell. This is not uncommon as chemotherapy can affect healthy cells as well as cancer cells. Report any side effects. Continue your course of treatment even though you feel ill unless your care team tells you to stop. You may need blood work done while you are taking this medication. This medication can cause serious side effects and infusion reactions. To reduce the risk, your care team may give you other medications to take before receiving this one. Be sure to follow the directions from your care team. This medication may increase your risk of getting an infection. Call your care team for advice if you get a fever, chills, sore throat, or other symptoms of a cold or flu. Do not treat yourself. Try to  avoid being around people who are sick. Avoid taking medications that contain aspirin, acetaminophen, ibuprofen, naproxen, or ketoprofen unless instructed by your care team. These medications may hide a fever. Be careful brushing or flossing your teeth or using a toothpick because you may get an infection or bleed more easily. If you have any dental work done, tell your dentist you are receiving this medication. Some products may contain alcohol. Ask your care team if this medication contains alcohol. Be sure to tell all care teams you are taking this medicine. Certain medications, like metronidazole and disulfiram, can cause an unpleasant reaction when taken with alcohol. The reaction includes flushing, headache, nausea, vomiting, sweating, and increased thirst. The reaction can last from 30 minutes to several hours. This medication may affect your coordination, reaction time, or judgement. Do not drive or operate machinery until you know how this medication affects you. Sit up or stand slowly to reduce the risk of dizzy or fainting spells. Drinking alcohol with this medication can increase the risk of these side effects. Talk to your care team about your risk of cancer. You may be more at risk for certain types of cancer if you take this medication. Talk to your care team if you wish to become pregnant or think you might be  pregnant. This medication can cause serious birth defects if taken during pregnancy or if you get pregnant within 2 months after stopping therapy. A negative pregnancy test is required before starting this medication. A reliable form of contraception is recommended while taking this medication and for 2 months after stopping it. Talk to your care team about reliable forms of contraception. Do not breast-feed while taking this medication and for 1 week after stopping therapy. Use a condom during sex and for 4 months after stopping therapy. Tell your care team right away if you think your  partner might be pregnant. This medication can cause serious birth defects. This medication may cause infertility. Talk to your care team if you are concerned about your fertility. What side effects may I notice from receiving this medication? Side effects that you should report to your care team as soon as possible: Allergic reactions--skin rash, itching, hives, swelling of the face, lips, tongue, or throat Change in vision such as blurry vision, seeing halos around lights, vision loss Infection--fever, chills, cough, or sore throat Infusion reactions--chest pain, shortness of breath or trouble breathing, feeling faint or lightheaded Low red blood cell level--unusual weakness or fatigue, dizziness, headache, trouble breathing Pain, tingling, or numbness in the hands or feet Painful swelling, warmth, or redness of the skin, blisters or sores at the infusion site Redness, blistering, peeling, or loosening of the skin, including inside the mouth Sudden or severe stomach pain, bloody diarrhea, fever, nausea, vomiting Swelling of the ankles, hands, or feet Tumor lysis syndrome (TLS)--nausea, vomiting, diarrhea, decrease in the amount of urine, dark urine, unusual weakness or fatigue, confusion, muscle pain or cramps, fast or irregular heartbeat, joint pain Unusual bruising or bleeding Side effects that usually do not require medical attention (report to your care team if they continue or are bothersome): Change in nail shape, thickness, or color Change in taste Hair loss Increased tears This list may not describe all possible side effects. Call your doctor for medical advice about side effects. You may report side effects to FDA at 1-800-FDA-1088. Where should I keep my medication? This medication is given in a hospital or clinic. It will not be stored at home. NOTE: This sheet is a summary. It may not cover all possible information. If you have questions about this medicine, talk to your doctor,  pharmacist, or health care provider.  2024 Elsevier/Gold Standard (2021-07-15 00:00:00)

## 2023-08-04 ENCOUNTER — Ambulatory Visit (HOSPITAL_COMMUNITY)
Admission: RE | Admit: 2023-08-04 | Discharge: 2023-08-04 | Disposition: A | Discharge status: Home / Self Care | Source: Ambulatory Visit | Attending: *Deleted | Admitting: *Deleted

## 2023-08-04 ENCOUNTER — Telehealth: Payer: Self-pay | Admitting: *Deleted

## 2023-08-04 ENCOUNTER — Telehealth: Payer: Self-pay

## 2023-08-04 DIAGNOSIS — K9400 Colostomy complication, unspecified: Secondary | ICD-10-CM

## 2023-08-04 DIAGNOSIS — Z433 Encounter for attention to colostomy: Secondary | ICD-10-CM | POA: Insufficient documentation

## 2023-08-04 DIAGNOSIS — K5720 Diverticulitis of large intestine with perforation and abscess without bleeding: Secondary | ICD-10-CM | POA: Diagnosis present

## 2023-08-04 LAB — URINE CULTURE: Culture: <10000 — AB

## 2023-08-04 NOTE — Progress Notes (Signed)
 [facility]   Reason for visit:  LLQ colostomy  continuing to work towards independence with ostomy care.  HPI:  Diverticulitis with perforation and end colostomy Past Medical History:  Diagnosis Date  . Anxiety state, unspecified   . Diverticulosis   . Esophageal reflux   . Family history of breast cancer   . Family history of lymphoma   . Family history of stomach cancer   . Family history of uterine cancer   . Fibroid tumor   . Headache    due to allergies   . Hiatal hernia   . Hyperlipidemia   . IBS (irritable bowel syndrome)   . Internal hemorrhoids   . Nonspecific elevation of levels of transaminase or lactic acid dehydrogenase (LDH)   . ovarian ca dx'd 11/2018  . Renal stone 07/2013  . Tubulovillous adenoma of colon   . Varicose veins    superficail thrombophlebitis (left LE)   Family History  Problem Relation Age of Onset  . Lymphoma Father        dx 42s  . Heart disease Father   . Hypertension Father   . Hypertension Mother   . Hyperlipidemia Mother   . Breast cancer Mother 68  . Diabetes Maternal Grandmother   . Uterine cancer Maternal Grandmother        dx 38s  . Stomach cancer Paternal Grandfather   . Breast cancer Other        grandmother's sisters, both dx 16s  . Colon cancer Neg Hx    Allergies  Allergen Reactions  . Carboplatin Other (See Comments)    Chest tightness/burning, flushing.  See Progress Note from 12/29/2022.  . Skin Adhesives [Cyanoacrylate] Hives  . Shrimp [Shellfish Allergy] Itching  . Sulfasalazine Nausea And Vomiting  . Sulfonamide Derivatives Nausea Only  . Telithromycin     Other Reaction(s): severe dizziness   Current Outpatient Medications  Medication Sig Dispense Refill Last Dose/Taking  . acetaminophen (TYLENOL) 500 MG tablet Take 500 mg by mouth every 6 (six) hours as needed for mild pain (pain score 1-3) or moderate pain (pain score 4-6).     [name] Kitchen ALPRAZolam (XANAX) 0.25 MG tablet Take 1 tablet (0.25 mg total)  by mouth 2 (two) times daily as needed for anxiety or sleep. 60 tablet 0   . Cholecalciferol (VITAMIN D3) 25 MCG (1000 UT) CAPS Take 1,000 Units by mouth daily.      [name] Kitchen dexamethasone (DECADRON) 4 MG tablet Take 2 tablets by mouth the morning before chemo and 2 tablets by mouth daily for 2 days after chemotherapy, every 3 weeks x 6 cycles. 24 tablet 6   . esomeprazole (NEXIUM) 20 MG capsule Take 20 mg by mouth daily.      . famotidine (PEPCID) 20 MG tablet Take 20 mg by mouth as needed for heartburn or indigestion.     . lidocaine-prilocaine (EMLA) cream Apply topically as needed. 30 g 1   . magnesium oxide (MAG-OX) 400 (240 Mg) MG tablet Take 1 tablet (400 mg total) by mouth daily. 30 tablet 1   . Multiple Vitamins-Minerals (AIRBORNE) CHEW Chew 1 each by mouth in the morning, at noon, and at bedtime.     . Multiple Vitamins-Minerals (MULTI FOR HER PO) Take 1 tablet by mouth daily.     . ondansetron (ZOFRAN-ODT) 4 MG disintegrating tablet Take 1 tablet (4 mg total) by mouth every 8 (eight) hours as needed for nausea or vomiting. 20 tablet 0   . oxyCODONE (  OXY IR/ROXICODONE) 5 MG immediate release tablet Take 1 tablet (5 mg total) by mouth every 6 (six) hours as needed for severe pain (pain score 7-10) or moderate pain (pain score 4-6). 90 tablet 0   . polyethylene glycol (MIRALAX / GLYCOLAX) 17 g packet Take 17 g by mouth 2 (two) times daily. 14 each 0   . prochlorperazine (COMPAZINE) 10 MG tablet Take 1 tablet by mouth every 6 hours as needed (Nausea or vomiting). 30 tablet 1    No current facility-administered medications for this encounter.   ROS  Review of Systems  Constitutional:  Positive for fatigue.  Gastrointestinal:        LLQ colostomy  Skin:  Positive for color change.  Psychiatric/Behavioral:  The patient is nervous/anxious.   All other systems reviewed and are negative. Vital signs:  BP 137/69   Pulse 86   Temp 97.6 F (36.4 C) (Oral)   Resp 18   SpO2 100%  Exam:  Physical  Exam Vitals reviewed.  Constitutional:      Appearance: Normal appearance.  HENT:     Mouth/Throat:     Mouth: Mucous membranes are moist.  Cardiovascular:     Rate and Rhythm: Normal rate.  Abdominal:     Palpations: Abdomen is soft.  Musculoskeletal:        General: Normal range of motion.  Skin:    General: Skin is warm and dry.     Findings: Erythema present.  Neurological:     General: No focal deficit present.     Mental Status: She is oriented to person, place, and time.  Psychiatric:        Mood and Affect: Mood normal.        Behavior: Behavior normal.        Thought Content: Thought content normal.        Judgment: Judgment normal.    Stoma type/location:  LLQ colostomy Stomal assessment/size:  1 3/8" slightly budded and oval.  Pink and moist with sloughing mucosa at 3 o'clock Peristomal assessment:  stoma is in a valley circumferentially Treatment options for stomal/peristomal skin: will implement convex 2 piece system.  Prefers transparent pouch Output: soft brown stool Ostomy pouching: 2 pc. Convex with barrier ring.  Transparent.   Education provided:  performed pouch change.  Explained rationale for convex system.     Impression/dx  colostomy Discussion  Sloughing mucosa should fall away revealing pink mucosa.  Stoma currently productive and not concerning Plan  See back one week to assess ostomy care and pouching system.     Visit time: 55 minutes.   Mike Gip FNP-BC

## 2023-08-04 NOTE — Discharge Instructions (Addendum)
 I will set up with [facility]  2 3/4" convex with barrier ring Transparent [email or web address]

## 2023-08-04 NOTE — Telephone Encounter (Signed)
 Called pt to see how she did with her recent treatment.  She reports doing well & denies any problems.  She knows her next appts & how to reach [place] if needed.

## 2023-08-04 NOTE — Telephone Encounter (Signed)
-----   Message from Artis Delay sent at 08/04/2023 11:54 AM EDT ----- Urine culture neg let her kow

## 2023-08-04 NOTE — Telephone Encounter (Signed)
 Called and given below message. She verbalized understanding.

## 2023-08-04 NOTE — Telephone Encounter (Signed)
-----   Message from Nurse Barbara Cower E sent at 08/03/2023  1:51 PM EDT ----- Regarding: first time taxotere- [place] Miss Stacy Rivas received her first taxotere infusion today. She is follwed by Dr. [name]. All went well with no issues.

## 2023-08-06 ENCOUNTER — Other Ambulatory Visit: Payer: Self-pay

## 2023-08-07 ENCOUNTER — Other Ambulatory Visit (HOSPITAL_COMMUNITY): Payer: Self-pay | Admitting: Nurse Practitioner

## 2023-08-07 DIAGNOSIS — Z433 Encounter for attention to colostomy: Secondary | ICD-10-CM

## 2023-08-07 DIAGNOSIS — L24B3 Irritant contact dermatitis related to fecal or urinary stoma or fistula: Secondary | ICD-10-CM

## 2023-08-08 NOTE — Assessment & Plan Note (Deleted)
She continues to have intermittent nausea I plan to reduce the dose of chemo and to add dexamethasone for early nausea She will continue antiemetics

## 2023-08-08 NOTE — Assessment & Plan Note (Addendum)
 She has anorexia and significant weight loss She has very poor oral intake We discussed importance of frequent small meals and increase activity as tolerated I gave her a flowsheet to document her oral intake I encouraged her to take nutritional supplement Recommend dietitian consult

## 2023-08-08 NOTE — Assessment & Plan Note (Addendum)
 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

## 2023-08-08 NOTE — Assessment & Plan Note (Addendum)
 Due to pre-existing peripheral neuropathy, I plan upfront dose reduction Stable Continue to monitor

## 2023-08-08 NOTE — Progress Notes (Unsigned)
 [facility] OFFICE PROGRESS NOTE  Patient Care Team: Geoffry Paradise, MD as PCP - General (Internal Medicine) Leanora Ivanoff, RN as Registered Nurse Griselda Miner, MD as Consulting Physician (General Surgery) Artis Delay, MD as Consulting Physician (Hematology and Oncology) Adolphus Birchwood, MD (Gynecologic Oncology) Beverley Fiedler, MD as Consulting Physician (Gastroenterology) Glee Arvin, NP as Nurse Practitioner (Hospice and Palliative Medicine)  Assessment & Plan Left ovarian epithelial cancer [facility]) 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) Due to pre-existing peripheral neuropathy, I plan upfront dose reduction Stable Continue to monitor Anorexia Doing better, recommend continue on frequent small meals and nutritional supplement Cancer associated pain Continue oxycodone prn, stable Mucositis due to antineoplastic therapy Recommend conservative approach with salt water with baking soda mix gargle  We discussed advanced directives today and she has appointment with palliative care consult   Artis Delay, MD  INTERVAL HISTORY: she returns for treatment follow-up with her sister She is eating  better She has not lost weight I reviewed her oral fluid intake She had some thumb pain but no worsening mucositis Complications related to previous cycle of chemotherapy included mucositis, and cancer associated pain,  PHYSICAL EXAMINATION: ECOG PERFORMANCE STATUS: 1 - Symptomatic but completely ambulatory  Vitals:   08/10/23 0828  BP: 127/69  Pulse: 93  Resp: 16  Temp: 98.2 F (36.8 C)  SpO2: 99%   Filed Weights   08/10/23 0828  Weight: 135 lb 6.4 oz (61.4 kg)    Relevant data reviewed during this visit included her documented flowsheet on oral intake

## 2023-08-09 NOTE — Progress Notes (Unsigned)
 Palliative Medicine [facility]  [phone] [phone]   Name: Stacy Rivas Date: 08/09/2023 MRN: [id number]  DOB: Dec 28, 1955  Patient Care Team: Geoffry Paradise, MD as PCP - General (Internal Medicine) Leanora Ivanoff, RN as Registered Nurse Griselda Miner, MD as Consulting Physician (General Surgery) Artis Delay, MD as Consulting Physician (Hematology and Oncology) Adolphus Birchwood, MD (Gynecologic Oncology) Pyrtle, Carie Caddy, MD as Consulting Physician (Gastroenterology)    REASON FOR CONSULTATION: Stacy Rivas is a 68 y.o. 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 Patient is currently receiving docetaxel.  Palliative ask to see for symptom management and goals of care.    SOCIAL HISTORY:     reports that she quit smoking about 43 years ago. Her smoking use included cigarettes. She started smoking about 53 years ago. She has never used smokeless tobacco. She reports current alcohol use. She reports that she does not use drugs.  ADVANCE DIRECTIVES:  None on file  CODE STATUS: Full code  PAST MEDICAL HISTORY: Past Medical History:  Diagnosis Date   Anxiety state, unspecified    Diverticulosis    Esophageal reflux    Family history of breast cancer    Family history of lymphoma    Family history of stomach cancer    Family history of uterine cancer    Fibroid tumor    Headache    due to allergies    Hiatal hernia    Hyperlipidemia    IBS (irritable bowel syndrome)    Internal hemorrhoids    Nonspecific elevation of levels of transaminase or lactic acid dehydrogenase (LDH)    ovarian ca dx'd 11/2018   Renal stone 07/2013   Tubulovillous adenoma of colon    Varicose veins    superficail thrombophlebitis (left LE)    PAST SURGICAL HISTORY:  Past Surgical History:  Procedure Laterality Date   CATARACT EXTRACTION, BILATERAL      cystoscopy with instillation for cystitis  2001   DEBULKING N/A 02/12/2019   Procedure: DEBULKING OF TUMOR;  Surgeon: Adolphus Birchwood, MD;  Location: [facility];  Service: Gynecology;  Laterality: N/A;   DILATION AND CURETTAGE OF UTERUS     miscarriage   HYSTERECTOMY ABDOMINAL WITH SALPINGO-OOPHORECTOMY Bilateral 02/12/2019   Procedure: HYSTERECTOMY ABDOMINAL WITH SALPINGO-OOPHORECTOMY;  Surgeon: Adolphus Birchwood, MD;  Location: [facility];  Service: Gynecology;  Laterality: Bilateral;   IR IMAGING GUIDED PORT INSERTION  12/06/2018   IR IMAGING GUIDED PORT INSERTION  10/10/2019   IR PARACENTESIS  12/03/2018   IR PARACENTESIS  12/14/2018   IR REMOVAL TUN ACCESS W/ PORT W/O FL MOD SED  06/03/2019   LAPAROSCOPY     adhesions   LAPAROTOMY N/A 07/19/2023   Procedure: EXPLORATORY LAPAROTOMY, DIVERTING LOOP COLOSTOMY;  Surgeon: Griselda Miner, MD;  Location: [facility];  Service: General;  Laterality: N/A;   LITHOTRIPSY  07/2013   x 2   MYOMECTOMY  1992   OMENTECTOMY N/A 02/12/2019   Procedure: OMENTECTOMY;  Surgeon: Adolphus Birchwood, MD;  Location: [facility];  Service: Gynecology;  Laterality: N/A;   PARACENTESIS  11/21/2018   abdominal , removed 3.1 Liters     HEMATOLOGY/ONCOLOGY HISTORY:  Oncology History Overview Note  Her 2 neu 1+ MSI stable, serous, ER 40% Negative genetics Progressed on niraparib, paclitaxel, Doxil and bevacizumab Allergic to carboplatin Folate receptor alpha neg PD-L1 CPS 1%   Left ovarian epithelial cancer (HCC)  11/09/2018 Imaging   1. 14.7 cm poorly defined soft tissue mass in central pelvis suspicious for primary ovarian carcinoma, with uterine leiomyosarcoma considered a less likely differential diagnosis. 2. Moderate ascites and diffuse peritoneal carcinomatosis. 3. Several small uterine fibroids.   11/13/2018 Tumor Marker   Patient's tumor was tested for the following markers: CA-125 Results of the tumor marker test revealed 1434   11/21/2018 Procedure   Successful ultrasound-guided  diagnostic and therapeutic paracentesis yielding 3.1 liters of peritoneal fluid   11/21/2018 Pathology Results   PERITONEAL/ASCITIC FLUID(SPECIMEN 1 OF 1 COLLECTED 11/21/18): ADENOCARCINOMA. Specimen Clinical Information Pelvic mass suspicious for ovarian cancer Source Peritoneal/Ascitic Fluid, (specimen 1 of 1 collected 11/21/18) Gross Specimen: Received is/are 1000 ccs of dark amber fluid. (CM:cm) Prepared: # Smears: 0 # Concentration Technique Slides (i.e. 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   11/29/2018 Initial Diagnosis   Ovarian cancer (HCC)   11/29/2018 Cancer Staging   Staging form: Ovary, Fallopian Tube, and Primary Peritoneal Carcinoma, AJCC 8th Edition - Clinical: cT3, cN0, cM0 - Signed by Artis Delay, MD on 11/29/2018   12/03/2018 Procedure   Successful ultrasound-guided therapeutic paracentesis yielding 3.7 liters of peritoneal fluid.     12/06/2018 Procedure   Placement of a subcutaneous port device. Catheter tip at the SVC and right atrium junction   12/14/2018 Procedure   Successful ultrasound-guided paracentesis yielding 2.9 L of peritoneal fluid   12/28/2018 Tumor Marker   Patient's tumor was tested for the following markers: CA-125 Results of the tumor marker test revealed 812.   12/28/2018 - 04/22/2019 Chemotherapy   The patient had carboplatin and taxol for neoadjuvant treatment, followed by interval debulking surgery and subsequent adjuvant chemotherapy treatment.     02/01/2019 Imaging   Ct abdomen and pelvis 8.6 cm left ovarian mass, corresponding to the patient's known primary neoplasm, improved.   Mild peritoneal nodularity/omental caking, improved.   Small abdominopelvic ascites, improved.     02/01/2019 Tumor Marker   Patient's tumor was tested for the following markers: CA-125 Results of the tumor marker test revealed 183.   02/12/2019 Surgery    Preoperative Diagnosis: Stage IIIC ovarian cancer, s/p neoadjuvant chemotherapy      Procedure(s) Performed: 1. Exploratory laparotomy with total abdominal hysterectomy, bilateral salpingo-oophorectomy, omentectomy radical tumor debulking for ovarian cancer.   Surgeon: Stacy Dago, MD.    Operative Findings: upper abdomen free of disease. No visible omental disease. Small volume (200cc) ascites. 8cm friable mass replacing left ovary and adherent to the sigmoid colon mesentery and ureter on the left.  Anterior fibroid. This represented an optimal cytoreduction (R0) with no gross visible disease remaining.    02/12/2019 Pathology Results   A. OVARY AND FALLOPIAN TUBE, LEFT, SALPINGO-OOPHORECTOMY: - Serous carcinoma, high grade, status post neoadjuvant therapy - See oncology table and comment below B. UTERUS CERVIX WITH RIGHT FALLOPIAN TUBE AND OVARY, HYSTERECTOMY: Uterus: - Serosal surface involved by serous carcinoma - Endomyometrium uninvolved by carcinoma - Benign endometrial polyp (4.1 cm) - Leiomyomata (5.5 cm; largest) - Adenomyosis Cervix: - Uninvolved by carcinoma Left ovary: - Serous carcinoma, high grade Left fallopian tube: - Serous carcinoma, high grade C. SOFT TISSUE, LEFT PELVIC SIDEWALL TUMOR, EXCISION: - Metastatic serous carcinoma, high-grade D. SOFT TISSUE, SIGMOID COLON MESENTERY, EXCISION: - Metastatic serous carcinoma, high-grade E. OMENTUM, TUMOR RESECTION: - Metastatic serous carcinoma, high-grade OVARY or FALLOPIAN TUBE or PRIMARY PERITONEUM: Procedure: Total hysterectomy and bilateral salpingo-oophorectomy, omentectomy  and peritoneal biopsies Specimen Integrity: Fragmented Tumor Site: Left ovary and fallopian tube Ovarian Surface Involvement: Present Fallopian Tube Surface Involvement: Present Tumor Size: 6.3 cm (see comment) Histologic Type: Serous carcinoma Histologic Grade: High-grade Implants: Not applicable Other Tissue/ Organ Involvement: Right  ovary, right fallopian tube, omentum, mesentery Largest Extrapelvic Peritoneal Focus: Macroscopic Peritoneal/Ascitic Fluid: Malignant Treatment Effect: No definite or minimal response identified (chemotherapy response score 1 [CRS1] Regional Lymph Nodes: No lymph nodes submitted or found Pathologic Stage Classification (pTNM, AJCC 8th Edition): pT3c, pNX Representative Tumor Block: A4 Comment(s): Additional testing (HER-2, MMR and MSI) are pending. The primary tumor site appears to be the left ovary and fallopian tube. The uterus is only involved on the serosal surface. There is tumor on the anterior peritoneal reflection.  Addendum: Tumor is Her2 negative,MSI stable   02/19/2019 Tumor Marker   Patient's tumor was tested for the following markers: CA-125 Results of the tumor marker test revealed 40.3   04/01/2019 Tumor Marker   Patient's tumor was tested for the following markers: CA-125 Results of the tumor marker test revealed 8.1    Genetic Testing   Negative testing. No pathogenic variants identified on the [employer]. The report date is 04/17/2019.  Somatic genes analyzed through [facility]: ATM, BARD1, BRCA1, BRCA2, BRIP1, CHEK2, MRE11A, NBN, PALB2, RAD51C, RAD51D.  The [employer] gene panel offered by [employer] includes sequencing and rearrangement analysis for the following 36 genes: APC*, ATM*, AXIN2, BARD1, BMPR1A, BRCA1*, BRCA2*, BRIP1*, CDH1*, CDK4, CDKN2A, CHEK2*, DICER1, MLH1*, MSH2*, MSH3, MSH6*, MUTYH*, NBN, NF1*, NTHL1, PALB2*, PMS2*, PTEN*, RAD51C*, RAD51D*, RECQL, SMAD4, SMARCA4, STK11 and TP53* (sequencing and deletion/duplication); HOXB13, POLD1 and POLE (sequencing only); EPCAM and GREM1 (deletion/duplication only).    05/21/2019 Imaging   1. Interval hysterectomy, oophorectomy and omentectomy. No evidence of residual ovarian carcinoma in the pelvis. No evidence of peritoneal disease. No intraperitoneal free fluid. 2. Nonobstructing  LEFT renal calculi.  Normal ureters.   05/22/2019 Tumor Marker   Patient's tumor was tested for the following markers: CA-125 Results of the tumor marker test revealed 5.7.   06/03/2019 Procedure   Successful right IJ vein Port-A-Cath explant.   08/26/2019 Tumor Marker   Patient's tumor was tested for the following markers: CA-125. Results of the tumor marker test revealed 4.9   10/03/2019 Imaging   1. New tumor deposits along the capsular surfaces of the liver, spleen, in the left pelvis, and right paracolic gutter, compatible with metastatic disease. 2. Mild dilation of the dorsal pancreatic duct in the pancreatic body and head, cause uncertain. 3. Nonobstructive left nephrolithiasis. 4. Aortic atherosclerosis.   Aortic Atherosclerosis (ICD10-I70.0   10/10/2019 Procedure   Successful placement of a right IJ approach Power Port with ultrasound and fluoroscopic guidance. The catheter is ready for use.     10/11/2019 Tumor Marker   Patient's tumor was tested for the following markers: CA-125 Results of the tumor marker test revealed 19.6.   10/14/2019 -  Chemotherapy   The patient had carboplatin and gemzar for chemotherapy treatment.     11/04/2019 Tumor Marker   Patient's tumor was tested for the following markers: CA-125 Results of the tumor marker test revealed 11.4   11/28/2019 Tumor Marker   Patient's tumor was tested for the following markers: CA-125 Results of the tumor marker test revealed 5.8   12/20/2019 Imaging   1. Significant interval reduction in mixed solid and cystic nodule in the vicinity of the left ovary, with significant improvement or resolution of multiple pelvic,  peritoneal, and organ capsule implants. Resolution of previously seen small volume ascites. Findings are consistent with treatment response of abdominal metastatic disease. 2. Status post hysterectomy, oophorectomy, and omentectomy. 3. Nonobstructive left nephrolithiasis. 4. Aortic Atherosclerosis  (ICD10-I70.0).   12/23/2019 Tumor Marker   Patient's tumor was tested for the following markers: CA-125 Results of the tumor marker test revealed 5.0   01/24/2020 Tumor Marker   Patient's tumor was tested for the following markers: CA-125 Results of the tumor marker test revealed 4.8   02/24/2020 Tumor Marker   Patient's tumor was tested for the following markers: CA-125. Results of the tumor marker test revealed 4.3.   03/06/2020 Imaging   1. Status post hysterectomy, bilateral oophorectomy, and omentectomy. 2. Resolution of previously described left adnexal nodularity. 3. No residual disease identified. 4.  Aortic Atherosclerosis (ICD10-I70.0). 5. Left nephrolithiasis.   04/20/2020 Tumor Marker   Patient's tumor was tested for the following markers: CA-125 Results of the tumor marker test revealed 4.6   05/18/2020 Tumor Marker   Patient's tumor was tested for the following markers: CA-125 Results of the tumor marker test revealed 4.7   05/29/2020 Imaging   Status post hysterectomy and bilateral salpingo oophorectomy.   No evidence of recurrent or metastatic disease.   06/08/2020 - 09/13/2021 Chemotherapy   She was on niraparib       06/17/2020 Tumor Marker   Patient's tumor was tested for the following markers: CA-125 Results of the tumor marker test revealed 4.5   08/04/2020 Tumor Marker   Patient's tumor was tested for the following markers: CA-125. Results of the tumor marker test revealed 5.7   09/07/2020 Imaging   1. Status post hysterectomy and bilateral salpingo oophorectomy. No evidence of recurrent or metastatic disease. 2. Nonobstructing left nephrolithiasis. 3. Left-sided colonic diverticulosis without findings of acute diverticulitis. 4. Aortic atherosclerosis.     09/07/2020 Tumor Marker   Patient's tumor was tested for the following markers: CA-125 Results of the tumor marker test revealed 4.8.   10/21/2020 Tumor Marker   Patient's tumor was tested for the  following markers: CA-125. Results of the tumor marker test revealed 5.1.   12/14/2020 Tumor Marker   Patient's tumor was tested for the following markers: CA-125. Results of the tumor marker test revealed 5.5.   04/05/2021 Tumor Marker   Patient's tumor was tested for the following markers: CA-125. Results of the tumor marker test revealed 5.9.   05/31/2021 Tumor Marker   Patient's tumor was tested for the following markers: CA-125. Results of the tumor marker test revealed 8.1.   09/13/2021 Imaging   1. Interval development of multiple new cystic and solid lesions in the abdomen and pelvis measuring up to 10.3 x 7.2 cm and consistent with peritoneal metastatic disease. 2. Nonobstructing left renal stones. 3. Aortic Atherosclerosis (ICD10-I70.0).     09/14/2021 Tumor Marker   Patient's tumor was tested for the following markers: CA-125. Results of the tumor marker test revealed 28.5.   09/16/2021 Echocardiogram   1. Left ventricular ejection fraction, by estimation, is 60 to 65%. The left ventricle has normal function. The left ventricle has no regional wall motion abnormalities. Left ventricular diastolic parameters are consistent with Grade I diastolic dysfunction (impaired relaxation).  2. Right ventricular systolic function is normal. The right ventricular size is normal.  3. The mitral valve is normal in structure. Trivial mitral valve regurgitation. No evidence of mitral stenosis.  4. The aortic valve has an indeterminant number of cusps. Aortic valve regurgitation is  trivial. No aortic stenosis is present.  5. The inferior vena cava is normal in size with greater than 50% respiratory variability, suggesting right atrial pressure of 3 mmHg.     09/20/2021 - 01/17/2022 Chemotherapy   Patient is on Treatment Plan : OVARIAN RECURRENT Liposomal Doxorubicin + Carboplatin q28d X 6 Cycles     09/20/2021 - 02/22/2022 Chemotherapy   Patient is on Treatment Plan : OVARIAN RECURRENT Liposomal  Doxorubicin + Carboplatin q28d X 6 Cycles     09/23/2021 Tumor Marker   Patient's tumor was tested for the following markers: CA-125. Results of the tumor marker test revealed 27.7.   11/24/2021 Tumor Marker   Patient's tumor was tested for the following markers: CA-125. Results of the tumor marker test revealed 28.4.   12/13/2021 Imaging   1. Overall stable peritoneal carcinomatosis. No new disease is demonstrated. 2. Moderate left-sided hydroureteronephrosis likely due to compression of the left ureter by the left pelvic mass. 3. Stable left-sided renal calculi. 4. Numerous left renal calculi.     12/22/2021 Tumor Marker   Patient's tumor was tested for the following markers: CA-125. Results of the tumor marker test revealed 24.7.   01/18/2022 Tumor Marker   Patient's tumor was tested for the following markers: CA-125. Results of the tumor marker test revealed 9.9.   01/27/2022 Echocardiogram    1. Left ventricular ejection fraction, by estimation, is 60 to 65%. The left ventricle has normal function. The left ventricle has no regional wall motion abnormalities. Left ventricular diastolic parameters are consistent with Grade I diastolic dysfunction (impaired relaxation). The average left ventricular global longitudinal strain is -21.1 %. The global longitudinal strain is normal.  2. Right ventricular systolic function is normal. The right ventricular size is normal.  3. The mitral valve is abnormal. Mild mitral valve regurgitation.  4. The aortic valve is tricuspid. Aortic valve regurgitation is trivial.  5. The inferior vena cava is normal in size with greater than 50% respiratory variability, suggesting right atrial pressure of 3 mmHg.   02/23/2022 Tumor Marker   Patient's tumor was tested for the following markers: CA-125. Results of the tumor marker test revealed 8.5.   03/18/2022 Imaging   1. Diminished size of nodular and mixed solid and cystic peritoneal implants in the hepatorenal  fossa, right lower quadrant, and left pelvis. 2. Unchanged solid mass in the right pelvis measuring 4.0 x 3.7 cm. 3. Constellation of findings is overall consistent with treatment response 4. Status post hysterectomy, oophorectomy, and omentectomy. 5. Nonobstructive left nephrolithiasis.   Aortic Atherosclerosis (ICD10-I70.0).     03/22/2022 - 08/16/2022 Chemotherapy   Patient is on Treatment Plan : Ovarian Bevacizumab q21d     03/24/2022 Tumor Marker   Patient's tumor was tested for the following markers: CA-125. Results of the tumor marker test revealed 7.1.   04/28/2022 Tumor Marker   Patient's tumor was tested for the following markers: CA-125. Results of the tumor marker test revealed 7.0.   05/27/2022 Tumor Marker   Patient's tumor was tested for the following markers: CA-125. Results of the tumor marker test revealed 7.7.   06/14/2022 Imaging   1. Slight response to therapy as evidenced by decrease in size of a cystic and solid mass in the left adnexa as well as peritoneal implants in the right paracolic gutter. Solid right adnexal mass and additional peritoneal implants along right hepatic lobe and in the left anatomic pelvis appear stable. 2. Hepatic steatosis. 3. Left renal stones. 4.  Aortic atherosclerosis (  ICD10-I70.0).   06/17/2022 Tumor Marker   Patient's tumor was tested for the following markers: CA-125. Results of the tumor marker test revealed 11.2.   09/05/2022 Imaging   CT ABDOMEN PELVIS W CONTRAST  Result Date: 09/06/2022 CLINICAL DATA:  Ovarian cancer.  Restaging.  * Tracking Code: BO * EXAM: CT ABDOMEN AND PELVIS WITH CONTRAST TECHNIQUE: Multidetector CT imaging of the abdomen and pelvis was performed using the standard protocol following bolus administration of intravenous contrast. RADIATION DOSE REDUCTION: This exam was performed according to the departmental dose-optimization program which includes automated exposure control, adjustment of the mA and/or kV  according to patient size and/or use of iterative reconstruction technique. CONTRAST:  OMNIPAQUE IOHEXOL 300 MG/ML  SOLN COMPARISON:  06/13/2022 FINDINGS: Lower chest: Unremarkable. Hepatobiliary: No suspicious focal abnormality within the liver parenchyma. There is no evidence for gallstones, gallbladder wall thickening, or pericholecystic fluid. No intrahepatic or extrahepatic biliary dilation. Pancreas: No pancreatic mass lesion evident. Diffuse prominence of the main pancreatic duct is stable measuring up to 3-4 mm diameter in the head of the pancreas. Spleen: No splenomegaly. No focal mass lesion. Adrenals/Urinary Tract: No adrenal nodule or mass. Right kidney unremarkable. A cluster of nonobstructing stones in the upper pole right kidney is again noted with dominant stone measuring about 6 mm in size. No evidence for hydroureter. The urinary bladder appears normal for the degree of distention. Stomach/Bowel: Tiny hiatal hernia. Stomach otherwise unremarkable. Duodenum is normally positioned as is the ligament of Treitz. No small bowel wall thickening. No small bowel dilatation. The terminal ileum is normal. The appendix is normal. No gross colonic mass. No colonic wall thickening. Diverticular changes are noted in the left colon without evidence of diverticulitis. Vascular/Lymphatic: There is mild atherosclerotic calcification of the abdominal aorta without aneurysm. There is no gastrohepatic or hepatoduodenal ligament lymphadenopathy. No retroperitoneal or mesenteric lymphadenopathy. No pelvic sidewall lymphadenopathy. Reproductive: Right-sided adnexal mass measured previously at 4.2 x 3.8 cm is now 4.9 x 4.6 cm. Cystic and solid left adnexal lesion measured previously at 4.1 x 3.4 cm is now 3.2 x 2.9 cm. 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 The more anterior of the 2 index lesions measures 2.6 x 2.1 cm today compared to 2.4 x 1.6 cm previously. The more posterior lesion is  2.4 x 1.4 cm today compared to 2.0 x 1.9 cm previously. 5 mm soft tissue nodule identified in the inferior paracolic gutter previously at 5 mm measures 10 mm today on image 51/2. Implants along the left posterior peritoneum adjacent to the psoas muscle and iliac vessels appear progressive. 1.6 x 1.3 cm lesion visible on 58/2 was 1.1 x 0.8 cm previously (remeasured). No evidence for ascites. Musculoskeletal: No worrisome lytic or sclerotic osseous abnormality. IMPRESSION: 1. Overall generalized appearance of mild disease progression. 2. Index right-sided adnexal mass has progressed since the prior with interval decrease in size of the cystic and solid left adnexal lesion. 3. Interval progression of index capsular implants along the medial right liver. 4. Interval progression of soft tissue nodule in the inferior right paracolic gutter and peritoneal implants in the lower left abdomen and pelvis. 5. No evidence for ascites. 6. Nonobstructing left renal stones. 7. Tiny hiatal hernia. 8. Left colonic diverticulosis without diverticulitis. 9.  Aortic Atherosclerosis (ICD10-I70.0). Electronically Signed   By: Kennith Center M.D.   On: 09/06/2022 07:57      09/15/2022 - 03/31/2023 Chemotherapy   Patient is on Treatment Plan : OVARIAN Paclitaxel (175)  q21d X 6 Cycles     11/16/2022 Imaging   CT ABDOMEN PELVIS W CONTRAST  Result Date: 11/16/2022 CLINICAL DATA:  Ovarian carcinoma. Assess treatment response. * Tracking Code: BO * EXAM: CT ABDOMEN AND PELVIS WITH CONTRAST TECHNIQUE: Multidetector CT imaging of the abdomen and pelvis was performed using the standard protocol following bolus administration of intravenous contrast. RADIATION DOSE REDUCTION: This exam was performed according to the departmental dose-optimization program which includes automated exposure control, adjustment of the mA and/or kV according to patient size and/or use of iterative reconstruction technique. CONTRAST:  OMNIPAQUE IOHEXOL 300  MG/ML  SOLN COMPARISON:  None Available. FINDINGS: Lower chest: Lung bases are clear. Hepatobiliary: No focal hepatic lesion. Normal gallbladder. No biliary duct dilatation. Common bile duct is normal. Peritoneal implant described along the margin the RIGHT hepatic liver is much less conspicuous. Capsular lesion measuring 12 mm on image 14/2 is decreased from 26 mm. Pancreas: Pancreas is normal. No ductal dilatation. No pancreatic inflammation. Spleen: Normal spleen Adrenals/urinary tract: Adrenal glands normal. Multiple calculi within LEFT kidney without obstruction. Ureters and bladder normal. Stomach/Bowel: Stomach, small bowel, appendix, and cecum are normal. Multiple diverticula of the descending colon and sigmoid colon without acute inflammation. Vascular/Lymphatic: Abdominal aorta is normal caliber. No periportal or retroperitoneal adenopathy. No pelvic adenopathy. Reproductive: Solid enhancing mass in the deep RIGHT pelvis adjacent the rectum measures 4.0 x 4.3 cm compared to 4.6 x 4.9 cm. Small adjacent satellite nodule measuring 1.1 cm (image 63) is unchanged. Cystic lesion of the LEFT adnexa with mural nodularity measures 3.0 x 2.4 cm compared to 3.2 x 2.9 cm. No new nodularity in the pelvis. Post hysterectomy. Other: Interval decrease in the peritoneal nodularity. For example peritoneal nodule adjacent to the cecum in the RIGHT iliac fossa measures 6 mm (image 43/2 compared to 10 mm on prior. Lesions on the LEFT iliac vessels less prominent. For example 7 mm lesion on 50/2 decreased from 16 mm. Musculoskeletal: No aggressive osseous lesion. IMPRESSION: 1. Interval decrease in size of RIGHT pelvic mass. 2. Interval decrease in size of LEFT adnexal cystic mass. 3. Interval decrease in size of peritoneal nodularity. 4. Interval decrease in size of perihepatic peritoneal implant. 5. No new metastatic disease. Electronically Signed   By: Genevive Bi M.D.   On: 11/16/2022 14:34      02/08/2023 Imaging    CT ABDOMEN PELVIS W CONTRAST  Result Date: 02/08/2023 CLINICAL DATA:  Ovarian carcinoma.  Assess treatment response. EXAM: CT ABDOMEN AND PELVIS WITH CONTRAST TECHNIQUE: Multidetector CT imaging of the abdomen and pelvis was performed using the standard protocol following bolus administration of intravenous contrast. RADIATION DOSE REDUCTION: This exam was performed according to the departmental dose-optimization program which includes automated exposure control, adjustment of the mA and/or kV according to patient size and/or use of iterative reconstruction technique. CONTRAST:  OMNIPAQUE IOHEXOL 300 MG/ML  SOLN COMPARISON:  CT 11/14/2022 FINDINGS: Lower chest: Lung bases are clear. Hepatobiliary: No focal hepatic lesion. Normal gallbladder. No biliary duct dilatation. Common bile duct is normal. Small peritoneal implant along the RIGHT hepatic lobe is less conspicuous at 6 mm (image 18/2) compared to 11 mm on prior. Pancreas: Pancreas is normal. No ductal dilatation. No pancreatic inflammation. Spleen: Normal spleen Adrenals/urinary tract: Adrenal glands normal. Multiple nonobstructing calculi in the upper pole of the LEFT kidney. Ureters and bladder normal. Stomach/Bowel: Stomach, small bowel, appendix, and cecum are normal. The colon and rectosigmoid colon are normal. Vascular/Lymphatic: Abdominal aorta is normal caliber  with atherosclerotic calcification. There is no retroperitoneal or periportal lymphadenopathy. No pelvic lymphadenopathy. Reproductive: Post hysterectomy anatomy. Again demonstrated rounded mass posterior to the vaginal cuff just RIGHT of midline measures 3.7 by 3.0 cm (image 69/series 2) compared to 4.3 x 4.0 cm on most recent CT. Part cystic and solid lesion in the LEFT adnexa measures 2.2 x 1.6 cm (image 65) compared to 3.0 x 2.2 cm. Previously measured peritoneal implant in the deep RIGHT pelvis is no longer identified. No new mass lesions within the pelvis. Other: No new  peritoneal disease.  No free fluid. Musculoskeletal: No aggressive osseous lesion. IMPRESSION: 1. Interval decrease in size rounded mass in the deep RIGHT pelvis. 2. Interval decrease in size of cystic LEFT adnexal lesion. 3. Decrease in size of peritoneal implants. 4. No evidence of new metastatic disease. No intraperitoneal free fluid Electronically Signed   By: Genevive Bi M.D.   On: 02/08/2023 10:56      04/10/2023 Imaging   CT ABDOMEN PELVIS W CONTRAST  Result Date: 04/13/2023 CLINICAL DATA:  Ovarian cancer.  Restaging.  * Tracking Code: BO * EXAM: CT ABDOMEN AND PELVIS WITH CONTRAST TECHNIQUE: Multidetector CT imaging of the abdomen and pelvis was performed using the standard protocol following bolus administration of intravenous contrast. RADIATION DOSE REDUCTION: This exam was performed according to the departmental dose-optimization program which includes automated exposure control, adjustment of the mA and/or kV according to patient size and/or use of iterative reconstruction technique. CONTRAST:  OMNIPAQUE IOHEXOL 300 MG/ML  SOLN COMPARISON:  01/31/2023 FINDINGS: Lower chest: No acute findings. Hepatobiliary: No suspicious focal abnormality within the liver parenchyma. There is no evidence for gallstones, gallbladder wall thickening, or pericholecystic fluid. No intrahepatic or extrahepatic biliary dilation. Pancreas: No focal mass lesion. No dilatation of the main duct. No intraparenchymal cyst. No peripancreatic edema. Spleen: No splenomegaly. No suspicious focal mass lesion. Adrenals/Urinary Tract: No adrenal nodule or mass. Right kidney unremarkable. Clustered nonobstructing stones are seen in the upper pole left kidney measuring up to 7 mm diameter. No evidence for hydroureter. The urinary bladder appears normal for the degree of distention. Stomach/Bowel: Stomach is unremarkable. No gastric wall thickening. No evidence of outlet obstruction. Duodenum is normally positioned as is  the ligament of Treitz. No small bowel wall thickening. No small bowel dilatation. The terminal ileum is normal. The appendix is normal. No gross colonic mass. No colonic wall thickening. Diverticular changes are noted in the left colon without evidence of diverticulitis. Vascular/Lymphatic: There is mild atherosclerotic calcification of the abdominal aorta without aneurysm. There is no gastrohepatic or hepatoduodenal ligament lymphadenopathy. No retroperitoneal or mesenteric lymphadenopathy. No pelvic sidewall lymphadenopathy. Reproductive: Uterus surgically absent. Right paramidline pelvic mass lesion again evident. This is just posterior to the right vaginal cuff measuring 4.2 x 4.2 cm on image 68/2 today which compares to 3.7 x 3.1 cm previously. Cystic lesion in the left adnexal space is also progressive measuring 2.5 x 1.9 cm today compared to 2.2 x 1.6 cm previously. There is some enhancing soft tissue along the antro inferior aspect of this lesion (see image 65/2). No new mesenteric or peritoneal lesion evident. Other: No intraperitoneal free fluid. Musculoskeletal: No worrisome lytic or sclerotic osseous abnormality. IMPRESSION: 1. Interval progression of the right pelvic lesion along the vaginal cuff measuring 4.2 x 4.2 cm today compared to 3.7 x 3.1 cm previously. Cystic lesion in the left adnexal space is also progressive measuring 2.5 x 1.9 cm today compared to 2.2 x 1.6 cm  previously. 2. No new mesenteric or peritoneal lesion evident. 3. Clustered nonobstructing stones in the upper pole left kidney measuring up to 7 mm diameter. 4. Left colonic diverticulosis without diverticulitis. 5.  Aortic Atherosclerosis (ICD10-I70.0). Electronically Signed   By: Kennith Center M.D.   On: 04/13/2023 14:04   DG Chest 2 View  Result Date: 03/22/2023 CLINICAL DATA:  Cough EXAM: CHEST - 2 VIEW COMPARISON:  None Available. FINDINGS: Right-sided chest port terminates at the level of the distal SVC. The heart size  and mediastinal contours are within normal limits. Both lungs are clear. The visualized skeletal structures are unremarkable. IMPRESSION: No active cardiopulmonary disease. Electronically Signed   By: Duanne Guess D.O.   On: 03/22/2023 16:29       04/18/2023 Echocardiogram       1. Left ventricular ejection fraction, by estimation, is 60 to 65%. The left ventricle has normal function. The left ventricle has no regional wall motion abnormalities. There is mild left ventricular hypertrophy. Left ventricular diastolic parameters  are consistent with Grade I diastolic dysfunction (impaired relaxation). The average left ventricular global longitudinal strain is -19.3 %. The global longitudinal strain is normal.  2. Right ventricular systolic function is normal. The right ventricular size is normal. Tricuspid regurgitation signal is inadequate for assessing PA pressure.  3. The mitral valve is normal in structure. Trivial mitral valve regurgitation. No evidence of mitral stenosis.  4. The aortic valve was not well visualized. Aortic valve regurgitation is trivial. No aortic stenosis is present.  5. The inferior vena cava is normal in size with greater than 50% respiratory variability, suggesting right atrial pressure of 3 mmHg.   04/26/2023 - 06/22/2023 Chemotherapy   Patient is on Treatment Plan : OVARIAN Liposomal Doxorubicin (40) q28d     07/07/2023 Imaging   CT CHEST ABDOMEN PELVIS W CONTRAST Result Date: 07/10/2023 CLINICAL DATA:  History of recurrent ovarian cancer, follow-up. * Tracking Code: BO * EXAM: CT CHEST, ABDOMEN, AND PELVIS WITH CONTRAST TECHNIQUE: Multidetector CT imaging of the chest, abdomen and pelvis was performed following the standard protocol during bolus administration of intravenous contrast. RADIATION DOSE REDUCTION: This exam was performed according to the departmental dose-optimization program which includes automated exposure control, adjustment of the mA and/or kV  according to patient size and/or use of iterative reconstruction technique. CONTRAST:  OMNIPAQUE IOHEXOL 300 MG/ML  SOLN COMPARISON:  Multiple priors including CT April 10, 2023 FINDINGS: CT CHEST FINDINGS Cardiovascular: Right chest Port-A-Cath with tip near the superior cavoatrial junction. Normal caliber thoracic aorta. Normal size heart. Mediastinum/Nodes: No suspicious thyroid nodule. No pathologically enlarged mediastinal, hilar or axillary lymph nodes. Esophagus is grossly unremarkable. Lungs/Pleura: Biapical pleuroparenchymal scarring. 4 mm left lower lobe pulmonary nodule on image 62/6 stable from coronary CT Oct 06, 2021 suggestive of a benign finding. No new suspicious pulmonary nodules or masses. Musculoskeletal: No aggressive lytic or blastic lesion of bone. CT ABDOMEN PELVIS FINDINGS Hepatobiliary: New heterogeneous 2.3 cm subcapsular segment VI hepatic lesion on image 57/2 and 14 mm segment VI/VII hepatic lesion on image 55/2. Gallbladder is unremarkable.  No biliary ductal dilation. Pancreas: No pancreatic ductal dilation or evidence of acute inflammation. Spleen: No splenomegaly. Adrenals/Urinary Tract: No suspicious adrenal nodule/mass. Hydronephrosis. Nonobstructive left renal stones measure up to 6 mm. Kidneys demonstrate symmetric enhancement. Mild symmetric wall thickening of an incompletely distended urinary bladder. Stomach/Bowel: Stomach is unremarkable for degree of distension. No pathologic dilation of small or large bowel. No evidence of acute bowel inflammation. Vascular/Lymphatic:  Normal caliber abdominal aorta. Aortic atherosclerosis. Smooth IVC contours. The portal, splenic and superior mesenteric veins are patent. Increased size of the low-density left external iliac lymph node now measuring 14 mm in short axis on image 93/2 previously 7 mm. Reproductive: Increased size of the heterogeneous lesion in the arising from the right vaginal cuff now measuring 4.9 x 3.4 cm on image  105/2 previously 2.5 x 1.9 cm. Left ovarian cystic lesion with persistent enhancing soft tissue along the margin of the lesion for instance on image 101/2 now in total measures 5.3 x 5.1 cm on image 108/2 previously 4.2 x 4.2 cm. Other: Increased abdominopelvic free fluid with a new loculated collection in the pelvis. Few new scattered soft tissue nodules in the omentum/peritoneum for instance along the right pericolic gutter measuring 7 mm on image 93/2 and in the left upper quadrant measuring 5 mm on image 53/2. Musculoskeletal: No aggressive lytic or blastic lesion of bone. IMPRESSION: 1. Increased size of the heterogeneous lesion arising from the right vaginal cuff, increased size of the left ovarian cystic lesion with persistent enhancing soft tissue along the margin of the lesion, and increased size of the low-density left external iliac lymph node, compatible with worsening disease. 2. Increased abdominopelvic free fluid with a new loculated collection in the pelvis and a few new scattered soft tissue nodules in the omentum/peritoneum, compatible with peritoneal carcinomatosis. 3. New heterogeneous 2.3 cm subcapsular segment VI hepatic lesion and 14 mm segment VI/VII hepatic lesion, compatible with hepatic metastatic disease. 4. No evidence of metastatic disease in the chest. 5. Nonobstructive left renal stones measure up to 6 mm. 6. Mild symmetric wall thickening of an incompletely distended urinary bladder. Correlate with urinalysis to exclude cystitis. 7.  Aortic Atherosclerosis (ICD10-I70.0). Electronically Signed   By: Maudry Mayhew M.D.   On: 07/10/2023 11:39      07/16/2023 Imaging   Interval development of severe diffuse colonic dilatation with complete obstruction at the level of the distal sigmoid colon, most likely due to external compression from adjacent malignancy or peritoneal implant given history of ovarian carcinoma. There is noted 11.5 x 9.1 cm complex cystic abnormality in the pelvis  with large peripheral solid components which is concerning for cystic ovarian malignancy or metastatic disease, or possibly loculated ascites with adjacent peritoneal carcinomatosis.   Nonobstructive left nephrolithiasis is noted. Mild bilateral hydroureteronephrosis is noted without obstructing calculus, concerning for distal ureteral obstruction secondary to neoplasm.   Aortic Atherosclerosis (ICD10-I70.0).   07/19/2023 Surgery   PRE-OPERATIVE DIAGNOSIS:  METASTAIC OVARIAN CANCER, COLONIC OBSTRUCTION   POST-OPERATIVE DIAGNOSIS:  METASTAIC OVARIAN CANCER, COLONIC OBSTRUCTION   PROCEDURE:  Procedure(s): EXPLORATORY LAPAROTOMY, DIVERTING LOOP COLOSTOMY (N/A)   08/03/2023 -  Chemotherapy   Patient is on Treatment Plan : Ovarian Docetaxel (100) q21d       ALLERGIES:  is allergic to carboplatin, skin adhesives [cyanoacrylate], shrimp [shellfish allergy], sulfasalazine, sulfonamide derivatives, and telithromycin.  MEDICATIONS:  Current Outpatient Medications  Medication Sig Dispense Refill   acetaminophen (TYLENOL) 500 MG tablet Take 500 mg by mouth every 6 (six) hours as needed for mild pain (pain score 1-3) or moderate pain (pain score 4-6).     ALPRAZolam (XANAX) 0.25 MG tablet Take 1 tablet (0.25 mg total) by mouth 2 (two) times daily as needed for anxiety or sleep. 60 tablet 0   Cholecalciferol (VITAMIN D3) 25 MCG (1000 UT) CAPS Take 1,000 Units by mouth daily.      dexamethasone (DECADRON) 4 MG tablet Take 2  tablets by mouth the morning before chemo and 2 tablets by mouth daily for 2 days after chemotherapy, every 3 weeks x 6 cycles. 24 tablet 6   esomeprazole (NEXIUM) 20 MG capsule Take 20 mg by mouth daily.      famotidine (PEPCID) 20 MG tablet Take 20 mg by mouth as needed for heartburn or indigestion.     lidocaine-prilocaine (EMLA) cream Apply topically as needed. 30 g 1   magnesium oxide (MAG-OX) 400 (240 Mg) MG tablet Take 1 tablet (400 mg total) by mouth daily. 30 tablet 1    Multiple Vitamins-Minerals (AIRBORNE) CHEW Chew 1 each by mouth in the morning, at noon, and at bedtime.     Multiple Vitamins-Minerals (MULTI FOR HER PO) Take 1 tablet by mouth daily.     ondansetron (ZOFRAN-ODT) 4 MG disintegrating tablet Take 1 tablet (4 mg total) by mouth every 8 (eight) hours as needed for nausea or vomiting. 20 tablet 0   oxyCODONE (OXY IR/ROXICODONE) 5 MG immediate release tablet Take 1 tablet (5 mg total) by mouth every 6 (six) hours as needed for severe pain (pain score 7-10) or moderate pain (pain score 4-6). 90 tablet 0   polyethylene glycol (MIRALAX / GLYCOLAX) 17 g packet Take 17 g by mouth 2 (two) times daily. 14 each 0   prochlorperazine (COMPAZINE) 10 MG tablet Take 1 tablet by mouth every 6 hours as needed (Nausea or vomiting). 30 tablet 1   No current facility-administered medications for this visit.    VITAL SIGNS: There were no vitals taken for this visit. There were no vitals filed for this visit.  Estimated body mass index is 24.44 kg/m as calculated from the following:   Height as of 08/03/23: 5\' 2"  (1.575 m).   Weight as of 08/03/23: 133 lb 9.6 oz (60.6 kg).  LABS: CBC:    Component Value Date/Time   WBC 10.8 (H) 08/03/2023 1010   WBC 6.7 07/22/2023 0309   HGB 9.3 (L) 08/03/2023 1010   HCT 28.2 (L) 08/03/2023 1010   PLT 366 08/03/2023 1010   MCV 94.0 08/03/2023 1010   NEUTROABS 8.9 (H) 08/03/2023 1010   LYMPHSABS 0.9 08/03/2023 1010   MONOABS 0.9 08/03/2023 1010   EOSABS 0.1 08/03/2023 1010   BASOSABS 0.0 08/03/2023 1010   Comprehensive Metabolic Panel:    Component Value Date/Time   NA 138 08/03/2023 1010   K 3.0 (L) 08/03/2023 1010   CL 102 08/03/2023 1010   CO2 29 08/03/2023 1010   BUN 10 08/03/2023 1010   CREATININE 0.40 (L) 08/03/2023 1010   GLUCOSE 118 (H) 08/03/2023 1010   CALCIUM 9.0 08/03/2023 1010   AST 21 08/03/2023 1010   ALT 13 08/03/2023 1010   ALKPHOS 315 (H) 08/03/2023 1010   BILITOT 1.0 08/03/2023 1010   PROT 7.3  08/03/2023 1010   ALBUMIN 3.7 08/03/2023 1010    RADIOGRAPHIC STUDIES:  CT ABDOMEN PELVIS WO CONTRAST Result Date: 07/16/2023 CLINICAL DATA:  Generalized abdominal pain for 4 days. History of ovarian cancer. EXAM: CT ABDOMEN AND PELVIS WITHOUT CONTRAST TECHNIQUE: Multidetector CT imaging of the abdomen and pelvis was performed following the standard protocol without IV contrast. RADIATION DOSE REDUCTION: This exam was performed according to the departmental dose-optimization program which includes automated exposure control, adjustment of the mA and/or kV according to patient size and/or use of iterative reconstruction technique. COMPARISON:  July 07, 2023 FINDINGS: Lower chest: No acute abnormality. Hepatobiliary: No focal liver abnormality is seen. No gallstones, gallbladder wall  thickening, or biliary dilatation. Pancreas: Unremarkable. No pancreatic ductal dilatation or surrounding inflammatory changes. Spleen: Normal in size without focal abnormality. Adrenals/Urinary Tract: Adrenal glands appear normal. Nonobstructive left nephrolithiasis is noted. Mild bilateral hydroureteronephrosis is noted without obstructing calculus. This is concerning for distal ureteral obstruction. Urinary bladder is decompressed. Stomach/Bowel: Stomach and appendix are unremarkable. No significant small bowel dilatation is noted. However, there is severe colonic dilatation which appears to be due to distal sigmoid colon obstruction, most likely due to external compression from adjacent peritoneal implant or malignancy. Vascular/Lymphatic: Aortic atherosclerosis. No significant adenopathy is noted currently. Reproductive: Status post hysterectomy. 5.3 x 3.0 cm left adnexal cystic lesion is noted which is not significantly changed compared to prior exam. 11.5 x 9.1 cm complex cystic abnormality is noted in the pelvis with large peripheral solid components. This is concerning for cystic ovarian malignancy or metastatic  disease or possibly loculated ascites and adjacent peritoneal carcinomatosis. Other: No definite hernia is noted. Musculoskeletal: No acute or significant osseous findings. IMPRESSION: Interval development of severe diffuse colonic dilatation with complete obstruction at the level of the distal sigmoid colon, most likely due to external compression from adjacent malignancy or peritoneal implant given history of ovarian carcinoma. There is noted 11.5 x 9.1 cm complex cystic abnormality in the pelvis with large peripheral solid components which is concerning for cystic ovarian malignancy or metastatic disease, or possibly loculated ascites with adjacent peritoneal carcinomatosis. Nonobstructive left nephrolithiasis is noted. Mild bilateral hydroureteronephrosis is noted without obstructing calculus, concerning for distal ureteral obstruction secondary to neoplasm. Aortic Atherosclerosis (ICD10-I70.0). Electronically Signed   By: Lupita Raider M.D.   On: 07/16/2023 11:53   PERFORMANCE STATUS (ECOG) : {CHL ONC ECOG ZO:[id number]}  Review of Systems Unless otherwise noted, a complete review of systems is negative.  Physical Exam General: NAD Cardiovascular: regular rate and rhythm Pulmonary: clear ant fields Abdomen: soft, nontender, + bowel sounds Extremities: no edema, no joint deformities Skin: no rashes Neurological: Alert and oriented x3  IMPRESSION: *** I introduced myself, Maygan RN, and Palliative's role in collaboration with the oncology team. Concept of Palliative Care was introduced as specialized medical care for people and their families living with serious illness.  It focuses on providing relief from the symptoms and stress of a serious illness.  The goal is to improve quality of life for both the patient and the family. Values and goals of care important to patient and family were attempted to be elicited.    We discussed *** current illness and what it means in the larger context  of *** on-going co-morbidities. Natural disease trajectory and expectations were discussed.  I discussed the importance of continued conversation with family and their medical providers regarding overall plan of care and treatment options, ensuring decisions are within the context of the patients values and GOCs.  PLAN: Established therapeutic relationship. Education provided on palliative's role in collaboration with their Oncology/Radiation team. I will plan to see patient back in 2-4 weeks in collaboration to other oncology appointments.    Patient expressed understanding and was in agreement with this plan. She also understands that She can call the clinic at any time with any questions, concerns, or complaints.   Thank you for your referral and allowing Palliative to assist in Stacy Rivas's care.   Number and complexity of problems addressed: ***HIGH - 1 or more chronic illnesses with SEVERE exacerbation, progression, or side effects of treatment - advanced cancer, pain. Any controlled substances utilized were  prescribed in the context of palliative care.   Visit consisted of counseling and education dealing with the complex and emotionally intense issues of symptom management and palliative care in the setting of serious and potentially life-threatening illness.  Signed by: Willette Alma, AGPCNP-BC Palliative Medicine Team/[facility]

## 2023-08-10 ENCOUNTER — Other Ambulatory Visit: Payer: Self-pay

## 2023-08-10 ENCOUNTER — Inpatient Hospital Stay (HOSPITAL_BASED_OUTPATIENT_CLINIC_OR_DEPARTMENT_OTHER): Admitting: Hematology and Oncology

## 2023-08-10 ENCOUNTER — Inpatient Hospital Stay (HOSPITAL_BASED_OUTPATIENT_CLINIC_OR_DEPARTMENT_OTHER): Admitting: Nurse Practitioner

## 2023-08-10 ENCOUNTER — Encounter: Payer: Self-pay | Admitting: Nurse Practitioner

## 2023-08-10 VITALS — BP 127/69 | HR 93 | Temp 98.0°F | Resp 16 | Wt 134.2 lb

## 2023-08-10 VITALS — BP 127/69 | HR 93 | Temp 98.2°F | Resp 16 | Ht 62.0 in | Wt 135.4 lb

## 2023-08-10 DIAGNOSIS — K1231 Oral mucositis (ulcerative) due to antineoplastic therapy: Secondary | ICD-10-CM

## 2023-08-10 DIAGNOSIS — G893 Neoplasm related pain (acute) (chronic): Secondary | ICD-10-CM | POA: Diagnosis not present

## 2023-08-10 DIAGNOSIS — C7960 Secondary malignant neoplasm of unspecified ovary: Secondary | ICD-10-CM

## 2023-08-10 DIAGNOSIS — Z515 Encounter for palliative care: Secondary | ICD-10-CM | POA: Diagnosis not present

## 2023-08-10 DIAGNOSIS — F419 Anxiety disorder, unspecified: Secondary | ICD-10-CM

## 2023-08-10 DIAGNOSIS — C562 Malignant neoplasm of left ovary: Secondary | ICD-10-CM

## 2023-08-10 DIAGNOSIS — R630 Anorexia: Secondary | ICD-10-CM

## 2023-08-10 DIAGNOSIS — Z5111 Encounter for antineoplastic chemotherapy: Secondary | ICD-10-CM | POA: Diagnosis not present

## 2023-08-10 DIAGNOSIS — G620 Drug-induced polyneuropathy: Secondary | ICD-10-CM | POA: Diagnosis not present

## 2023-08-10 DIAGNOSIS — T451X5A Adverse effect of antineoplastic and immunosuppressive drugs, initial encounter: Secondary | ICD-10-CM

## 2023-08-10 DIAGNOSIS — Z7189 Other specified counseling: Secondary | ICD-10-CM | POA: Diagnosis not present

## 2023-08-10 DIAGNOSIS — R530 Neoplastic (malignant) related fatigue: Secondary | ICD-10-CM

## 2023-08-10 DIAGNOSIS — R110 Nausea: Secondary | ICD-10-CM

## 2023-08-10 NOTE — Assessment & Plan Note (Addendum)
 Recommend conservative approach with salt water with baking soda mix gargle

## 2023-08-10 NOTE — Progress Notes (Signed)
 Palliative Medicine [facility]  [phone] Fax:([phone]   Name: Stacy Rivas Date: 08/10/2023 MRN: [id number]  DOB: [birth year]  Patient Care Team: Geoffry Paradise, MD as PCP - General (Internal Medicine) Leanora Ivanoff, RN as Registered Nurse Griselda Miner, MD as Consulting Physician (General Surgery) Artis Delay, MD as Consulting Physician (Hematology and Oncology) Adolphus Birchwood, MD (Gynecologic Oncology) Pyrtle, Carie Caddy, MD as Consulting Physician (Gastroenterology)    REASON FOR CONSULTATION: Stacy Rivas is a 68 y.o. 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  Palliative is seeing patient for symptom management and goals of care.    SOCIAL HISTORY:     reports that she quit smoking about 43 years ago. Her smoking use included cigarettes. She started smoking about 53 years ago. She has never used smokeless tobacco. She reports current alcohol use. She reports that she does not use drugs.  ADVANCE DIRECTIVES:  On file   CODE STATUS: Full code  PAST MEDICAL HISTORY: Past Medical History:  Diagnosis Date   Anxiety state, unspecified    Diverticulosis    Esophageal reflux    Family history of breast cancer    Family history of lymphoma    Family history of stomach cancer    Family history of uterine cancer    Fibroid tumor    Headache    due to allergies    Hiatal hernia    Hyperlipidemia    IBS (irritable bowel syndrome)    Internal hemorrhoids    Nonspecific elevation of levels of transaminase or lactic acid dehydrogenase (LDH)    ovarian ca dx'd 11/2018   Renal stone 07/2013   Tubulovillous adenoma of colon    Varicose veins    superficail thrombophlebitis (left LE)    PAST SURGICAL HISTORY:  Past Surgical History:  Procedure Laterality Date   CATARACT EXTRACTION, BILATERAL     cystoscopy with instillation for cystitis  2001    DEBULKING N/A 02/12/2019   Procedure: DEBULKING OF TUMOR;  Surgeon: Adolphus Birchwood, MD;  Location: [facility];  Service: Gynecology;  Laterality: N/A;   DILATION AND CURETTAGE OF UTERUS     miscarriage   HYSTERECTOMY ABDOMINAL WITH SALPINGO-OOPHORECTOMY Bilateral 02/12/2019   Procedure: HYSTERECTOMY ABDOMINAL WITH SALPINGO-OOPHORECTOMY;  Surgeon: Adolphus Birchwood, MD;  Location: [facility];  Service: Gynecology;  Laterality: Bilateral;   IR IMAGING GUIDED PORT INSERTION  12/06/2018   IR IMAGING GUIDED PORT INSERTION  10/10/2019   IR PARACENTESIS  12/03/2018   IR PARACENTESIS  12/14/2018   IR REMOVAL TUN ACCESS W/ PORT W/O FL MOD SED  06/03/2019   LAPAROSCOPY     adhesions   LAPAROTOMY N/A 07/19/2023   Procedure: EXPLORATORY LAPAROTOMY, DIVERTING LOOP COLOSTOMY;  Surgeon: Griselda Miner, MD;  Location: [facility];  Service: General;  Laterality: N/A;   LITHOTRIPSY  07/2013   x 2   MYOMECTOMY  1992   OMENTECTOMY N/A 02/12/2019   Procedure: OMENTECTOMY;  Surgeon: Adolphus Birchwood, MD;  Location: [facility];  Service: Gynecology;  Laterality: N/A;   PARACENTESIS  11/21/2018   abdominal , removed 3.1 Liters     HEMATOLOGY/ONCOLOGY HISTORY:  Oncology History Overview Note  Her 2 neu 1+ MSI stable, serous, ER 40% Negative genetics Progressed on niraparib, paclitaxel, Doxil and bevacizumab Allergic to carboplatin Folate receptor alpha neg PD-L1 CPS 1%   Left ovarian epithelial cancer (HCC)  11/09/2018 Imaging   1. 14.7  cm poorly defined soft tissue mass in central pelvis suspicious for primary ovarian carcinoma, with uterine leiomyosarcoma considered a less likely differential diagnosis. 2. Moderate ascites and diffuse peritoneal carcinomatosis. 3. Several small uterine fibroids.   11/13/2018 Tumor Marker   Patient's tumor was tested for the following markers: CA-125 Results of the tumor marker test revealed 1434   11/21/2018 Procedure   Successful ultrasound-guided diagnostic and therapeutic paracentesis yielding 3.1  liters of peritoneal fluid   11/21/2018 Pathology Results   PERITONEAL/ASCITIC FLUID(SPECIMEN 1 OF 1 COLLECTED 11/21/18): ADENOCARCINOMA. Specimen Clinical Information Pelvic mass suspicious for ovarian cancer Source Peritoneal/Ascitic Fluid, (specimen 1 of 1 collected 11/21/18) Gross Specimen: Received is/are 1000 ccs of dark amber fluid. (CM:cm) Prepared: # Smears: 0 # Concentration Technique Slides (i.e. 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   11/29/2018 Initial Diagnosis   Ovarian cancer (HCC)   11/29/2018 Cancer Staging   Staging form: Ovary, Fallopian Tube, and Primary Peritoneal Carcinoma, AJCC 8th Edition - Clinical: cT3, cN0, cM0 - Signed by Artis Delay, MD on 11/29/2018   12/03/2018 Procedure   Successful ultrasound-guided therapeutic paracentesis yielding 3.7 liters of peritoneal fluid.     12/06/2018 Procedure   Placement of a subcutaneous port device. Catheter tip at the SVC and right atrium junction   12/14/2018 Procedure   Successful ultrasound-guided paracentesis yielding 2.9 L of peritoneal fluid   12/28/2018 Tumor Marker   Patient's tumor was tested for the following markers: CA-125 Results of the tumor marker test revealed 812.   12/28/2018 - 04/22/2019 Chemotherapy   The patient had carboplatin and taxol for neoadjuvant treatment, followed by interval debulking surgery and subsequent adjuvant chemotherapy treatment.     02/01/2019 Imaging   Ct abdomen and pelvis 8.6 cm left ovarian mass, corresponding to the patient's known primary neoplasm, improved.   Mild peritoneal nodularity/omental caking, improved.   Small abdominopelvic ascites, improved.     02/01/2019 Tumor Marker   Patient's tumor was tested for the following markers: CA-125 Results of the tumor marker test revealed 183.   02/12/2019 Surgery   Preoperative Diagnosis: Stage IIIC ovarian cancer, s/p  neoadjuvant chemotherapy      Procedure(s) Performed: 1. Exploratory laparotomy with total abdominal hysterectomy, bilateral salpingo-oophorectomy, omentectomy radical tumor debulking for ovarian cancer.   Surgeon: Luisa Dago, MD.    Operative Findings: upper abdomen free of disease. No visible omental disease. Small volume (200cc) ascites. 8cm friable mass replacing left ovary and adherent to the sigmoid colon mesentery and ureter on the left.  Anterior fibroid. This represented an optimal cytoreduction (R0) with no gross visible disease remaining.    02/12/2019 Pathology Results   A. OVARY AND FALLOPIAN TUBE, LEFT, SALPINGO-OOPHORECTOMY: - Serous carcinoma, high grade, status post neoadjuvant therapy - See oncology table and comment below B. UTERUS CERVIX WITH RIGHT FALLOPIAN TUBE AND OVARY, HYSTERECTOMY: Uterus: - Serosal surface involved by serous carcinoma - Endomyometrium uninvolved by carcinoma - Benign endometrial polyp (4.1 cm) - Leiomyomata (5.5 cm; largest) - Adenomyosis Cervix: - Uninvolved by carcinoma Left ovary: - Serous carcinoma, high grade Left fallopian tube: - Serous carcinoma, high grade C. SOFT TISSUE, LEFT PELVIC SIDEWALL TUMOR, EXCISION: - Metastatic serous carcinoma, high-grade D. SOFT TISSUE, SIGMOID COLON MESENTERY, EXCISION: - Metastatic serous carcinoma, high-grade E. OMENTUM, TUMOR RESECTION: - Metastatic serous carcinoma, high-grade OVARY or FALLOPIAN TUBE or PRIMARY PERITONEUM: Procedure: Total hysterectomy and bilateral salpingo-oophorectomy, omentectomy and peritoneal biopsies Specimen Integrity: Fragmented  Tumor Site: Left ovary and fallopian tube Ovarian Surface Involvement: Present Fallopian Tube Surface Involvement: Present Tumor Size: 6.3 cm (see comment) Histologic Type: Serous carcinoma Histologic Grade: High-grade Implants: Not applicable Other Tissue/ Organ Involvement: Right ovary, right fallopian tube, omentum, mesentery Largest  Extrapelvic Peritoneal Focus: Macroscopic Peritoneal/Ascitic Fluid: Malignant Treatment Effect: No definite or minimal response identified (chemotherapy response score 1 [CRS1] Regional Lymph Nodes: No lymph nodes submitted or found Pathologic Stage Classification (pTNM, AJCC 8th Edition): pT3c, pNX Representative Tumor Block: A4 Comment(s): Additional testing (HER-2, MMR and MSI) are pending. The primary tumor site appears to be the left ovary and fallopian tube. The uterus is only involved on the serosal surface. There is tumor on the anterior peritoneal reflection.  Addendum: Tumor is Her2 negative,MSI stable   02/19/2019 Tumor Marker   Patient's tumor was tested for the following markers: CA-125 Results of the tumor marker test revealed 40.3   04/01/2019 Tumor Marker   Patient's tumor was tested for the following markers: CA-125 Results of the tumor marker test revealed 8.1    Genetic Testing   Negative testing. No pathogenic variants identified on the [employer]. The report date is 04/17/2019.  Somatic genes analyzed through [facility]: ATM, BARD1, BRCA1, BRCA2, BRIP1, CHEK2, MRE11A, NBN, PALB2, RAD51C, RAD51D.  The [employer] gene panel offered by [employer] includes sequencing and rearrangement analysis for the following 36 genes: APC*, ATM*, AXIN2, BARD1, BMPR1A, BRCA1*, BRCA2*, BRIP1*, CDH1*, CDK4, CDKN2A, CHEK2*, DICER1, MLH1*, MSH2*, MSH3, MSH6*, MUTYH*, NBN, NF1*, NTHL1, PALB2*, PMS2*, PTEN*, RAD51C*, RAD51D*, RECQL, SMAD4, SMARCA4, STK11 and TP53* (sequencing and deletion/duplication); HOXB13, POLD1 and POLE (sequencing only); EPCAM and GREM1 (deletion/duplication only).    05/21/2019 Imaging   1. Interval hysterectomy, oophorectomy and omentectomy. No evidence of residual ovarian carcinoma in the pelvis. No evidence of peritoneal disease. No intraperitoneal free fluid. 2. Nonobstructing LEFT renal calculi.  Normal ureters.   05/22/2019 Tumor  Marker   Patient's tumor was tested for the following markers: CA-125 Results of the tumor marker test revealed 5.7.   06/03/2019 Procedure   Successful right IJ vein Port-A-Cath explant.   08/26/2019 Tumor Marker   Patient's tumor was tested for the following markers: CA-125. Results of the tumor marker test revealed 4.9   10/03/2019 Imaging   1. New tumor deposits along the capsular surfaces of the liver, spleen, in the left pelvis, and right paracolic gutter, compatible with metastatic disease. 2. Mild dilation of the dorsal pancreatic duct in the pancreatic body and head, cause uncertain. 3. Nonobstructive left nephrolithiasis. 4. Aortic atherosclerosis.   Aortic Atherosclerosis (ICD10-I70.0   10/10/2019 Procedure   Successful placement of a right IJ approach Power Port with ultrasound and fluoroscopic guidance. The catheter is ready for use.     10/11/2019 Tumor Marker   Patient's tumor was tested for the following markers: CA-125 Results of the tumor marker test revealed 19.6.   10/14/2019 -  Chemotherapy   The patient had carboplatin and gemzar for chemotherapy treatment.     11/04/2019 Tumor Marker   Patient's tumor was tested for the following markers: CA-125 Results of the tumor marker test revealed 11.4   11/28/2019 Tumor Marker   Patient's tumor was tested for the following markers: CA-125 Results of the tumor marker test revealed 5.8   12/20/2019 Imaging   1. Significant interval reduction in mixed solid and cystic nodule in the vicinity of the left ovary, with significant improvement or resolution of multiple pelvic, peritoneal, and organ capsule implants. Resolution  of previously seen small volume ascites. Findings are consistent with treatment response of abdominal metastatic disease. 2. Status post hysterectomy, oophorectomy, and omentectomy. 3. Nonobstructive left nephrolithiasis. 4. Aortic Atherosclerosis (ICD10-I70.0).   12/23/2019 Tumor Marker   Patient's tumor  was tested for the following markers: CA-125 Results of the tumor marker test revealed 5.0   01/24/2020 Tumor Marker   Patient's tumor was tested for the following markers: CA-125 Results of the tumor marker test revealed 4.8   02/24/2020 Tumor Marker   Patient's tumor was tested for the following markers: CA-125. Results of the tumor marker test revealed 4.3.   03/06/2020 Imaging   1. Status post hysterectomy, bilateral oophorectomy, and omentectomy. 2. Resolution of previously described left adnexal nodularity. 3. No residual disease identified. 4.  Aortic Atherosclerosis (ICD10-I70.0). 5. Left nephrolithiasis.   04/20/2020 Tumor Marker   Patient's tumor was tested for the following markers: CA-125 Results of the tumor marker test revealed 4.6   05/18/2020 Tumor Marker   Patient's tumor was tested for the following markers: CA-125 Results of the tumor marker test revealed 4.7   05/29/2020 Imaging   Status post hysterectomy and bilateral salpingo oophorectomy.   No evidence of recurrent or metastatic disease.   06/08/2020 - 09/13/2021 Chemotherapy   She was on niraparib       06/17/2020 Tumor Marker   Patient's tumor was tested for the following markers: CA-125 Results of the tumor marker test revealed 4.5   08/04/2020 Tumor Marker   Patient's tumor was tested for the following markers: CA-125. Results of the tumor marker test revealed 5.7   09/07/2020 Imaging   1. Status post hysterectomy and bilateral salpingo oophorectomy. No evidence of recurrent or metastatic disease. 2. Nonobstructing left nephrolithiasis. 3. Left-sided colonic diverticulosis without findings of acute diverticulitis. 4. Aortic atherosclerosis.     09/07/2020 Tumor Marker   Patient's tumor was tested for the following markers: CA-125 Results of the tumor marker test revealed 4.8.   10/21/2020 Tumor Marker   Patient's tumor was tested for the following markers: CA-125. Results of the tumor marker  test revealed 5.1.   12/14/2020 Tumor Marker   Patient's tumor was tested for the following markers: CA-125. Results of the tumor marker test revealed 5.5.   04/05/2021 Tumor Marker   Patient's tumor was tested for the following markers: CA-125. Results of the tumor marker test revealed 5.9.   05/31/2021 Tumor Marker   Patient's tumor was tested for the following markers: CA-125. Results of the tumor marker test revealed 8.1.   09/13/2021 Imaging   1. Interval development of multiple new cystic and solid lesions in the abdomen and pelvis measuring up to 10.3 x 7.2 cm and consistent with peritoneal metastatic disease. 2. Nonobstructing left renal stones. 3. Aortic Atherosclerosis (ICD10-I70.0).     09/14/2021 Tumor Marker   Patient's tumor was tested for the following markers: CA-125. Results of the tumor marker test revealed 28.5.   09/16/2021 Echocardiogram   1. Left ventricular ejection fraction, by estimation, is 60 to 65%. The left ventricle has normal function. The left ventricle has no regional wall motion abnormalities. Left ventricular diastolic parameters are consistent with Grade I diastolic dysfunction (impaired relaxation).  2. Right ventricular systolic function is normal. The right ventricular size is normal.  3. The mitral valve is normal in structure. Trivial mitral valve regurgitation. No evidence of mitral stenosis.  4. The aortic valve has an indeterminant number of cusps. Aortic valve regurgitation is trivial. No aortic stenosis is present.  5. The inferior vena cava is normal in size with greater than 50% respiratory variability, suggesting right atrial pressure of 3 mmHg.     09/20/2021 - 01/17/2022 Chemotherapy   Patient is on Treatment Plan : OVARIAN RECURRENT Liposomal Doxorubicin + Carboplatin q28d X 6 Cycles     09/20/2021 - 02/22/2022 Chemotherapy   Patient is on Treatment Plan : OVARIAN RECURRENT Liposomal Doxorubicin + Carboplatin q28d X 6 Cycles     09/23/2021  Tumor Marker   Patient's tumor was tested for the following markers: CA-125. Results of the tumor marker test revealed 27.7.   11/24/2021 Tumor Marker   Patient's tumor was tested for the following markers: CA-125. Results of the tumor marker test revealed 28.4.   12/13/2021 Imaging   1. Overall stable peritoneal carcinomatosis. No new disease is demonstrated. 2. Moderate left-sided hydroureteronephrosis likely due to compression of the left ureter by the left pelvic mass. 3. Stable left-sided renal calculi. 4. Numerous left renal calculi.     12/22/2021 Tumor Marker   Patient's tumor was tested for the following markers: CA-125. Results of the tumor marker test revealed 24.7.   01/18/2022 Tumor Marker   Patient's tumor was tested for the following markers: CA-125. Results of the tumor marker test revealed 9.9.   01/27/2022 Echocardiogram    1. Left ventricular ejection fraction, by estimation, is 60 to 65%. The left ventricle has normal function. The left ventricle has no regional wall motion abnormalities. Left ventricular diastolic parameters are consistent with Grade I diastolic dysfunction (impaired relaxation). The average left ventricular global longitudinal strain is -21.1 %. The global longitudinal strain is normal.  2. Right ventricular systolic function is normal. The right ventricular size is normal.  3. The mitral valve is abnormal. Mild mitral valve regurgitation.  4. The aortic valve is tricuspid. Aortic valve regurgitation is trivial.  5. The inferior vena cava is normal in size with greater than 50% respiratory variability, suggesting right atrial pressure of 3 mmHg.   02/23/2022 Tumor Marker   Patient's tumor was tested for the following markers: CA-125. Results of the tumor marker test revealed 8.5.   03/18/2022 Imaging   1. Diminished size of nodular and mixed solid and cystic peritoneal implants in the hepatorenal fossa, right lower quadrant, and left pelvis. 2.  Unchanged solid mass in the right pelvis measuring 4.0 x 3.7 cm. 3. Constellation of findings is overall consistent with treatment response 4. Status post hysterectomy, oophorectomy, and omentectomy. 5. Nonobstructive left nephrolithiasis.   Aortic Atherosclerosis (ICD10-I70.0).     03/22/2022 - 08/16/2022 Chemotherapy   Patient is on Treatment Plan : Ovarian Bevacizumab q21d     03/24/2022 Tumor Marker   Patient's tumor was tested for the following markers: CA-125. Results of the tumor marker test revealed 7.1.   04/28/2022 Tumor Marker   Patient's tumor was tested for the following markers: CA-125. Results of the tumor marker test revealed 7.0.   05/27/2022 Tumor Marker   Patient's tumor was tested for the following markers: CA-125. Results of the tumor marker test revealed 7.7.   06/14/2022 Imaging   1. Slight response to therapy as evidenced by decrease in size of a cystic and solid mass in the left adnexa as well as peritoneal implants in the right paracolic gutter. Solid right adnexal mass and additional peritoneal implants along right hepatic lobe and in the left anatomic pelvis appear stable. 2. Hepatic steatosis. 3. Left renal stones. 4.  Aortic atherosclerosis (ICD10-I70.0).   06/17/2022 Tumor Marker  Patient's tumor was tested for the following markers: CA-125. Results of the tumor marker test revealed 11.2.   09/05/2022 Imaging   CT ABDOMEN PELVIS W CONTRAST  Result Date: 09/06/2022 CLINICAL DATA:  Ovarian cancer.  Restaging.  * Tracking Code: BO * EXAM: CT ABDOMEN AND PELVIS WITH CONTRAST TECHNIQUE: Multidetector CT imaging of the abdomen and pelvis was performed using the standard protocol following bolus administration of intravenous contrast. RADIATION DOSE REDUCTION: This exam was performed according to the departmental dose-optimization program which includes automated exposure control, adjustment of the mA and/or kV according to patient size and/or use of iterative  reconstruction technique. CONTRAST:  OMNIPAQUE IOHEXOL 300 MG/ML  SOLN COMPARISON:  06/13/2022 FINDINGS: Lower chest: Unremarkable. Hepatobiliary: No suspicious focal abnormality within the liver parenchyma. There is no evidence for gallstones, gallbladder wall thickening, or pericholecystic fluid. No intrahepatic or extrahepatic biliary dilation. Pancreas: No pancreatic mass lesion evident. Diffuse prominence of the main pancreatic duct is stable measuring up to 3-4 mm diameter in the head of the pancreas. Spleen: No splenomegaly. No focal mass lesion. Adrenals/Urinary Tract: No adrenal nodule or mass. Right kidney unremarkable. A cluster of nonobstructing stones in the upper pole right kidney is again noted with dominant stone measuring about 6 mm in size. No evidence for hydroureter. The urinary bladder appears normal for the degree of distention. Stomach/Bowel: Tiny hiatal hernia. Stomach otherwise unremarkable. Duodenum is normally positioned as is the ligament of Treitz. No small bowel wall thickening. No small bowel dilatation. The terminal ileum is normal. The appendix is normal. No gross colonic mass. No colonic wall thickening. Diverticular changes are noted in the left colon without evidence of diverticulitis. Vascular/Lymphatic: There is mild atherosclerotic calcification of the abdominal aorta without aneurysm. There is no gastrohepatic or hepatoduodenal ligament lymphadenopathy. No retroperitoneal or mesenteric lymphadenopathy. No pelvic sidewall lymphadenopathy. Reproductive: Right-sided adnexal mass measured previously at 4.2 x 3.8 cm is now 4.9 x 4.6 cm. Cystic and solid left adnexal lesion measured previously at 4.1 x 3.4 cm is now 3.2 x 2.9 cm. 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 The more anterior of the 2 index lesions measures 2.6 x 2.1 cm today compared to 2.4 x 1.6 cm previously. The more posterior lesion is 2.4 x 1.4 cm today compared to 2.0 x 1.9 cm  previously. 5 mm soft tissue nodule identified in the inferior paracolic gutter previously at 5 mm measures 10 mm today on image 51/2. Implants along the left posterior peritoneum adjacent to the psoas muscle and iliac vessels appear progressive. 1.6 x 1.3 cm lesion visible on 58/2 was 1.1 x 0.8 cm previously (remeasured). No evidence for ascites. Musculoskeletal: No worrisome lytic or sclerotic osseous abnormality. IMPRESSION: 1. Overall generalized appearance of mild disease progression. 2. Index right-sided adnexal mass has progressed since the prior with interval decrease in size of the cystic and solid left adnexal lesion. 3. Interval progression of index capsular implants along the medial right liver. 4. Interval progression of soft tissue nodule in the inferior right paracolic gutter and peritoneal implants in the lower left abdomen and pelvis. 5. No evidence for ascites. 6. Nonobstructing left renal stones. 7. Tiny hiatal hernia. 8. Left colonic diverticulosis without diverticulitis. 9.  Aortic Atherosclerosis (ICD10-I70.0). Electronically Signed   By: Kennith Center M.D.   On: 09/06/2022 07:57      09/15/2022 - 03/31/2023 Chemotherapy   Patient is on Treatment Plan : OVARIAN Paclitaxel (175) q21d X 6 Cycles  11/16/2022 Imaging   CT ABDOMEN PELVIS W CONTRAST  Result Date: 11/16/2022 CLINICAL DATA:  Ovarian carcinoma. Assess treatment response. * Tracking Code: BO * EXAM: CT ABDOMEN AND PELVIS WITH CONTRAST TECHNIQUE: Multidetector CT imaging of the abdomen and pelvis was performed using the standard protocol following bolus administration of intravenous contrast. RADIATION DOSE REDUCTION: This exam was performed according to the departmental dose-optimization program which includes automated exposure control, adjustment of the mA and/or kV according to patient size and/or use of iterative reconstruction technique. CONTRAST:  OMNIPAQUE IOHEXOL 300 MG/ML  SOLN COMPARISON:  None Available.  FINDINGS: Lower chest: Lung bases are clear. Hepatobiliary: No focal hepatic lesion. Normal gallbladder. No biliary duct dilatation. Common bile duct is normal. Peritoneal implant described along the margin the RIGHT hepatic liver is much less conspicuous. Capsular lesion measuring 12 mm on image 14/2 is decreased from 26 mm. Pancreas: Pancreas is normal. No ductal dilatation. No pancreatic inflammation. Spleen: Normal spleen Adrenals/urinary tract: Adrenal glands normal. Multiple calculi within LEFT kidney without obstruction. Ureters and bladder normal. Stomach/Bowel: Stomach, small bowel, appendix, and cecum are normal. Multiple diverticula of the descending colon and sigmoid colon without acute inflammation. Vascular/Lymphatic: Abdominal aorta is normal caliber. No periportal or retroperitoneal adenopathy. No pelvic adenopathy. Reproductive: Solid enhancing mass in the deep RIGHT pelvis adjacent the rectum measures 4.0 x 4.3 cm compared to 4.6 x 4.9 cm. Small adjacent satellite nodule measuring 1.1 cm (image 63) is unchanged. Cystic lesion of the LEFT adnexa with mural nodularity measures 3.0 x 2.4 cm compared to 3.2 x 2.9 cm. No new nodularity in the pelvis. Post hysterectomy. Other: Interval decrease in the peritoneal nodularity. For example peritoneal nodule adjacent to the cecum in the RIGHT iliac fossa measures 6 mm (image 43/2 compared to 10 mm on prior. Lesions on the LEFT iliac vessels less prominent. For example 7 mm lesion on 50/2 decreased from 16 mm. Musculoskeletal: No aggressive osseous lesion. IMPRESSION: 1. Interval decrease in size of RIGHT pelvic mass. 2. Interval decrease in size of LEFT adnexal cystic mass. 3. Interval decrease in size of peritoneal nodularity. 4. Interval decrease in size of perihepatic peritoneal implant. 5. No new metastatic disease. Electronically Signed   By: Genevive Bi M.D.   On: 11/16/2022 14:34      02/08/2023 Imaging   CT ABDOMEN PELVIS W CONTRAST  Result  Date: 02/08/2023 CLINICAL DATA:  Ovarian carcinoma.  Assess treatment response. EXAM: CT ABDOMEN AND PELVIS WITH CONTRAST TECHNIQUE: Multidetector CT imaging of the abdomen and pelvis was performed using the standard protocol following bolus administration of intravenous contrast. RADIATION DOSE REDUCTION: This exam was performed according to the departmental dose-optimization program which includes automated exposure control, adjustment of the mA and/or kV according to patient size and/or use of iterative reconstruction technique. CONTRAST:  OMNIPAQUE IOHEXOL 300 MG/ML  SOLN COMPARISON:  CT 11/14/2022 FINDINGS: Lower chest: Lung bases are clear. Hepatobiliary: No focal hepatic lesion. Normal gallbladder. No biliary duct dilatation. Common bile duct is normal. Small peritoneal implant along the RIGHT hepatic lobe is less conspicuous at 6 mm (image 18/2) compared to 11 mm on prior. Pancreas: Pancreas is normal. No ductal dilatation. No pancreatic inflammation. Spleen: Normal spleen Adrenals/urinary tract: Adrenal glands normal. Multiple nonobstructing calculi in the upper pole of the LEFT kidney. Ureters and bladder normal. Stomach/Bowel: Stomach, small bowel, appendix, and cecum are normal. The colon and rectosigmoid colon are normal. Vascular/Lymphatic: Abdominal aorta is normal caliber with atherosclerotic calcification. There is no retroperitoneal or  periportal lymphadenopathy. No pelvic lymphadenopathy. Reproductive: Post hysterectomy anatomy. Again demonstrated rounded mass posterior to the vaginal cuff just RIGHT of midline measures 3.7 by 3.0 cm (image 69/series 2) compared to 4.3 x 4.0 cm on most recent CT. Part cystic and solid lesion in the LEFT adnexa measures 2.2 x 1.6 cm (image 65) compared to 3.0 x 2.2 cm. Previously measured peritoneal implant in the deep RIGHT pelvis is no longer identified. No new mass lesions within the pelvis. Other: No new peritoneal disease.  No free fluid.  Musculoskeletal: No aggressive osseous lesion. IMPRESSION: 1. Interval decrease in size rounded mass in the deep RIGHT pelvis. 2. Interval decrease in size of cystic LEFT adnexal lesion. 3. Decrease in size of peritoneal implants. 4. No evidence of new metastatic disease. No intraperitoneal free fluid Electronically Signed   By: Genevive Bi M.D.   On: 02/08/2023 10:56      04/10/2023 Imaging   CT ABDOMEN PELVIS W CONTRAST  Result Date: 04/13/2023 CLINICAL DATA:  Ovarian cancer.  Restaging.  * Tracking Code: BO * EXAM: CT ABDOMEN AND PELVIS WITH CONTRAST TECHNIQUE: Multidetector CT imaging of the abdomen and pelvis was performed using the standard protocol following bolus administration of intravenous contrast. RADIATION DOSE REDUCTION: This exam was performed according to the departmental dose-optimization program which includes automated exposure control, adjustment of the mA and/or kV according to patient size and/or use of iterative reconstruction technique. CONTRAST:  OMNIPAQUE IOHEXOL 300 MG/ML  SOLN COMPARISON:  01/31/2023 FINDINGS: Lower chest: No acute findings. Hepatobiliary: No suspicious focal abnormality within the liver parenchyma. There is no evidence for gallstones, gallbladder wall thickening, or pericholecystic fluid. No intrahepatic or extrahepatic biliary dilation. Pancreas: No focal mass lesion. No dilatation of the main duct. No intraparenchymal cyst. No peripancreatic edema. Spleen: No splenomegaly. No suspicious focal mass lesion. Adrenals/Urinary Tract: No adrenal nodule or mass. Right kidney unremarkable. Clustered nonobstructing stones are seen in the upper pole left kidney measuring up to 7 mm diameter. No evidence for hydroureter. The urinary bladder appears normal for the degree of distention. Stomach/Bowel: Stomach is unremarkable. No gastric wall thickening. No evidence of outlet obstruction. Duodenum is normally positioned as is the ligament of Treitz. No small bowel  wall thickening. No small bowel dilatation. The terminal ileum is normal. The appendix is normal. No gross colonic mass. No colonic wall thickening. Diverticular changes are noted in the left colon without evidence of diverticulitis. Vascular/Lymphatic: There is mild atherosclerotic calcification of the abdominal aorta without aneurysm. There is no gastrohepatic or hepatoduodenal ligament lymphadenopathy. No retroperitoneal or mesenteric lymphadenopathy. No pelvic sidewall lymphadenopathy. Reproductive: Uterus surgically absent. Right paramidline pelvic mass lesion again evident. This is just posterior to the right vaginal cuff measuring 4.2 x 4.2 cm on image 68/2 today which compares to 3.7 x 3.1 cm previously. Cystic lesion in the left adnexal space is also progressive measuring 2.5 x 1.9 cm today compared to 2.2 x 1.6 cm previously. There is some enhancing soft tissue along the antro inferior aspect of this lesion (see image 65/2). No new mesenteric or peritoneal lesion evident. Other: No intraperitoneal free fluid. Musculoskeletal: No worrisome lytic or sclerotic osseous abnormality. IMPRESSION: 1. Interval progression of the right pelvic lesion along the vaginal cuff measuring 4.2 x 4.2 cm today compared to 3.7 x 3.1 cm previously. Cystic lesion in the left adnexal space is also progressive measuring 2.5 x 1.9 cm today compared to 2.2 x 1.6 cm previously. 2. No new mesenteric or peritoneal lesion  evident. 3. Clustered nonobstructing stones in the upper pole left kidney measuring up to 7 mm diameter. 4. Left colonic diverticulosis without diverticulitis. 5.  Aortic Atherosclerosis (ICD10-I70.0). Electronically Signed   By: Kennith Center M.D.   On: 04/13/2023 14:04   DG Chest 2 View  Result Date: 03/22/2023 CLINICAL DATA:  Cough EXAM: CHEST - 2 VIEW COMPARISON:  None Available. FINDINGS: Right-sided chest port terminates at the level of the distal SVC. The heart size and mediastinal contours are within  normal limits. Both lungs are clear. The visualized skeletal structures are unremarkable. IMPRESSION: No active cardiopulmonary disease. Electronically Signed   By: Duanne Guess D.O.   On: 03/22/2023 16:29       04/18/2023 Echocardiogram       1. Left ventricular ejection fraction, by estimation, is 60 to 65%. The left ventricle has normal function. The left ventricle has no regional wall motion abnormalities. There is mild left ventricular hypertrophy. Left ventricular diastolic parameters  are consistent with Grade I diastolic dysfunction (impaired relaxation). The average left ventricular global longitudinal strain is -19.3 %. The global longitudinal strain is normal.  2. Right ventricular systolic function is normal. The right ventricular size is normal. Tricuspid regurgitation signal is inadequate for assessing PA pressure.  3. The mitral valve is normal in structure. Trivial mitral valve regurgitation. No evidence of mitral stenosis.  4. The aortic valve was not well visualized. Aortic valve regurgitation is trivial. No aortic stenosis is present.  5. The inferior vena cava is normal in size with greater than 50% respiratory variability, suggesting right atrial pressure of 3 mmHg.   04/26/2023 - 06/22/2023 Chemotherapy   Patient is on Treatment Plan : OVARIAN Liposomal Doxorubicin (40) q28d     07/07/2023 Imaging   CT CHEST ABDOMEN PELVIS W CONTRAST Result Date: 07/10/2023 CLINICAL DATA:  History of recurrent ovarian cancer, follow-up. * Tracking Code: BO * EXAM: CT CHEST, ABDOMEN, AND PELVIS WITH CONTRAST TECHNIQUE: Multidetector CT imaging of the chest, abdomen and pelvis was performed following the standard protocol during bolus administration of intravenous contrast. RADIATION DOSE REDUCTION: This exam was performed according to the departmental dose-optimization program which includes automated exposure control, adjustment of the mA and/or kV according to patient size and/or use of  iterative reconstruction technique. CONTRAST:  OMNIPAQUE IOHEXOL 300 MG/ML  SOLN COMPARISON:  Multiple priors including CT April 10, 2023 FINDINGS: CT CHEST FINDINGS Cardiovascular: Right chest Port-A-Cath with tip near the superior cavoatrial junction. Normal caliber thoracic aorta. Normal size heart. Mediastinum/Nodes: No suspicious thyroid nodule. No pathologically enlarged mediastinal, hilar or axillary lymph nodes. Esophagus is grossly unremarkable. Lungs/Pleura: Biapical pleuroparenchymal scarring. 4 mm left lower lobe pulmonary nodule on image 62/6 stable from coronary CT Oct 06, 2021 suggestive of a benign finding. No new suspicious pulmonary nodules or masses. Musculoskeletal: No aggressive lytic or blastic lesion of bone. CT ABDOMEN PELVIS FINDINGS Hepatobiliary: New heterogeneous 2.3 cm subcapsular segment VI hepatic lesion on image 57/2 and 14 mm segment VI/VII hepatic lesion on image 55/2. Gallbladder is unremarkable.  No biliary ductal dilation. Pancreas: No pancreatic ductal dilation or evidence of acute inflammation. Spleen: No splenomegaly. Adrenals/Urinary Tract: No suspicious adrenal nodule/mass. Hydronephrosis. Nonobstructive left renal stones measure up to 6 mm. Kidneys demonstrate symmetric enhancement. Mild symmetric wall thickening of an incompletely distended urinary bladder. Stomach/Bowel: Stomach is unremarkable for degree of distension. No pathologic dilation of small or large bowel. No evidence of acute bowel inflammation. Vascular/Lymphatic: Normal caliber abdominal aorta. Aortic atherosclerosis. Smooth IVC  contours. The portal, splenic and superior mesenteric veins are patent. Increased size of the low-density left external iliac lymph node now measuring 14 mm in short axis on image 93/2 previously 7 mm. Reproductive: Increased size of the heterogeneous lesion in the arising from the right vaginal cuff now measuring 4.9 x 3.4 cm on image 105/2 previously 2.5 x 1.9 cm. Left  ovarian cystic lesion with persistent enhancing soft tissue along the margin of the lesion for instance on image 101/2 now in total measures 5.3 x 5.1 cm on image 108/2 previously 4.2 x 4.2 cm. Other: Increased abdominopelvic free fluid with a new loculated collection in the pelvis. Few new scattered soft tissue nodules in the omentum/peritoneum for instance along the right pericolic gutter measuring 7 mm on image 93/2 and in the left upper quadrant measuring 5 mm on image 53/2. Musculoskeletal: No aggressive lytic or blastic lesion of bone. IMPRESSION: 1. Increased size of the heterogeneous lesion arising from the right vaginal cuff, increased size of the left ovarian cystic lesion with persistent enhancing soft tissue along the margin of the lesion, and increased size of the low-density left external iliac lymph node, compatible with worsening disease. 2. Increased abdominopelvic free fluid with a new loculated collection in the pelvis and a few new scattered soft tissue nodules in the omentum/peritoneum, compatible with peritoneal carcinomatosis. 3. New heterogeneous 2.3 cm subcapsular segment VI hepatic lesion and 14 mm segment VI/VII hepatic lesion, compatible with hepatic metastatic disease. 4. No evidence of metastatic disease in the chest. 5. Nonobstructive left renal stones measure up to 6 mm. 6. Mild symmetric wall thickening of an incompletely distended urinary bladder. Correlate with urinalysis to exclude cystitis. 7.  Aortic Atherosclerosis (ICD10-I70.0). Electronically Signed   By: Maudry Mayhew M.D.   On: 07/10/2023 11:39      07/16/2023 Imaging   Interval development of severe diffuse colonic dilatation with complete obstruction at the level of the distal sigmoid colon, most likely due to external compression from adjacent malignancy or peritoneal implant given history of ovarian carcinoma. There is noted 11.5 x 9.1 cm complex cystic abnormality in the pelvis with large peripheral solid  components which is concerning for cystic ovarian malignancy or metastatic disease, or possibly loculated ascites with adjacent peritoneal carcinomatosis.   Nonobstructive left nephrolithiasis is noted. Mild bilateral hydroureteronephrosis is noted without obstructing calculus, concerning for distal ureteral obstruction secondary to neoplasm.   Aortic Atherosclerosis (ICD10-I70.0).   07/19/2023 Surgery   PRE-OPERATIVE DIAGNOSIS:  METASTAIC OVARIAN CANCER, COLONIC OBSTRUCTION   POST-OPERATIVE DIAGNOSIS:  METASTAIC OVARIAN CANCER, COLONIC OBSTRUCTION   PROCEDURE:  Procedure(s): EXPLORATORY LAPAROTOMY, DIVERTING LOOP COLOSTOMY (N/A)   08/03/2023 -  Chemotherapy   Patient is on Treatment Plan : Ovarian Docetaxel (100) q21d       ALLERGIES:  is allergic to carboplatin, skin adhesives [cyanoacrylate], shrimp [shellfish allergy], sulfasalazine, sulfonamide derivatives, and telithromycin.  MEDICATIONS:  Current Outpatient Medications  Medication Sig Dispense Refill   acetaminophen (TYLENOL) 500 MG tablet Take 500 mg by mouth every 6 (six) hours as needed for mild pain (pain score 1-3) or moderate pain (pain score 4-6).     ALPRAZolam (XANAX) 0.25 MG tablet Take 1 tablet (0.25 mg total) by mouth 2 (two) times daily as needed for anxiety or sleep. 60 tablet 0   Cholecalciferol (VITAMIN D3) 25 MCG (1000 UT) CAPS Take 1,000 Units by mouth daily.      dexamethasone (DECADRON) 4 MG tablet Take 2 tablets by mouth the morning before chemo and  2 tablets by mouth daily for 2 days after chemotherapy, every 3 weeks x 6 cycles. 24 tablet 6   esomeprazole (NEXIUM) 20 MG capsule Take 20 mg by mouth daily.      famotidine (PEPCID) 20 MG tablet Take 20 mg by mouth as needed for heartburn or indigestion.     lidocaine-prilocaine (EMLA) cream Apply topically as needed. 30 g 1   magnesium oxide (MAG-OX) 400 (240 Mg) MG tablet Take 1 tablet (400 mg total) by mouth daily. 30 tablet 1   Multiple Vitamins-Minerals  (AIRBORNE) CHEW Chew 1 each by mouth in the morning, at noon, and at bedtime.     Multiple Vitamins-Minerals (MULTI FOR HER PO) Take 1 tablet by mouth daily.     ondansetron (ZOFRAN-ODT) 4 MG disintegrating tablet Take 1 tablet (4 mg total) by mouth every 8 (eight) hours as needed for nausea or vomiting. 20 tablet 0   oxyCODONE (OXY IR/ROXICODONE) 5 MG immediate release tablet Take 1 tablet (5 mg total) by mouth every 6 (six) hours as needed for severe pain (pain score 7-10) or moderate pain (pain score 4-6). 90 tablet 0   polyethylene glycol (MIRALAX / GLYCOLAX) 17 g packet Take 17 g by mouth 2 (two) times daily. 14 each 0   prochlorperazine (COMPAZINE) 10 MG tablet Take 1 tablet by mouth every 6 hours as needed (Nausea or vomiting). 30 tablet 1   No current facility-administered medications for this visit.    VITAL SIGNS: BP 127/69 (BP Location: Left Arm, Patient Position: Sitting)   Pulse 93   Temp 98 F (36.7 C) (Oral)   Resp 16   Wt 134 lb 3 oz (60.9 kg)   SpO2 99%   BMI 24.54 kg/m  Filed Weights   08/10/23 0910  Weight: 134 lb 3 oz (60.9 kg)    Estimated body mass index is 24.54 kg/m as calculated from the following:   Height as of an earlier encounter on 08/10/23: 5\' 2"  (1.575 m).   Weight as of this encounter: 134 lb 3 oz (60.9 kg).  LABS: CBC:    Component Value Date/Time   WBC 10.8 (H) 08/03/2023 1010   WBC 6.7 07/22/2023 0309   HGB 9.3 (L) 08/03/2023 1010   HCT 28.2 (L) 08/03/2023 1010   PLT 366 08/03/2023 1010   MCV 94.0 08/03/2023 1010   NEUTROABS 8.9 (H) 08/03/2023 1010   LYMPHSABS 0.9 08/03/2023 1010   MONOABS 0.9 08/03/2023 1010   EOSABS 0.1 08/03/2023 1010   BASOSABS 0.0 08/03/2023 1010   Comprehensive Metabolic Panel:    Component Value Date/Time   NA 138 08/03/2023 1010   K 3.0 (L) 08/03/2023 1010   CL 102 08/03/2023 1010   CO2 29 08/03/2023 1010   BUN 10 08/03/2023 1010   CREATININE 0.40 (L) 08/03/2023 1010   GLUCOSE 118 (H) 08/03/2023 1010    CALCIUM 9.0 08/03/2023 1010   AST 21 08/03/2023 1010   ALT 13 08/03/2023 1010   ALKPHOS 315 (H) 08/03/2023 1010   BILITOT 1.0 08/03/2023 1010   PROT 7.3 08/03/2023 1010   ALBUMIN 3.7 08/03/2023 1010    RADIOGRAPHIC STUDIES: DG Abd 1 View Result Date: 07/16/2023 CLINICAL DATA:  Abdominal pain EXAM: ABDOMEN - 1 VIEW COMPARISON:  Abdominal radiographs 07/16/2023 FINDINGS: NG tube in stomach.  Side port below the GE junction. Mild improvement in gaseous distention of colon. For example transverse colon measures 5.7 cm compared to 6.1. Cecum remains distended. No dilated loops of small bowel. IMPRESSION: 1.  NG tube in stomach. 2. Some improvement in gaseous distention of the colon. Electronically Signed   By: Genevive Bi M.D.   On: 07/16/2023 17:48   CT ABDOMEN PELVIS WO CONTRAST Result Date: 07/16/2023 CLINICAL DATA:  Generalized abdominal pain for 4 days. History of ovarian cancer. EXAM: CT ABDOMEN AND PELVIS WITHOUT CONTRAST TECHNIQUE: Multidetector CT imaging of the abdomen and pelvis was performed following the standard protocol without IV contrast. RADIATION DOSE REDUCTION: This exam was performed according to the departmental dose-optimization program which includes automated exposure control, adjustment of the mA and/or kV according to patient size and/or use of iterative reconstruction technique. COMPARISON:  July 07, 2023 FINDINGS: Lower chest: No acute abnormality. Hepatobiliary: No focal liver abnormality is seen. No gallstones, gallbladder wall thickening, or biliary dilatation. Pancreas: Unremarkable. No pancreatic ductal dilatation or surrounding inflammatory changes. Spleen: Normal in size without focal abnormality. Adrenals/Urinary Tract: Adrenal glands appear normal. Nonobstructive left nephrolithiasis is noted. Mild bilateral hydroureteronephrosis is noted without obstructing calculus. This is concerning for distal ureteral obstruction. Urinary bladder is decompressed.  Stomach/Bowel: Stomach and appendix are unremarkable. No significant small bowel dilatation is noted. However, there is severe colonic dilatation which appears to be due to distal sigmoid colon obstruction, most likely due to external compression from adjacent peritoneal implant or malignancy. Vascular/Lymphatic: Aortic atherosclerosis. No significant adenopathy is noted currently. Reproductive: Status post hysterectomy. 5.3 x 3.0 cm left adnexal cystic lesion is noted which is not significantly changed compared to prior exam. 11.5 x 9.1 cm complex cystic abnormality is noted in the pelvis with large peripheral solid components. This is concerning for cystic ovarian malignancy or metastatic disease or possibly loculated ascites and adjacent peritoneal carcinomatosis. Other: No definite hernia is noted. Musculoskeletal: No acute or significant osseous findings. IMPRESSION: Interval development of severe diffuse colonic dilatation with complete obstruction at the level of the distal sigmoid colon, most likely due to external compression from adjacent malignancy or peritoneal implant given history of ovarian carcinoma. There is noted 11.5 x 9.1 cm complex cystic abnormality in the pelvis with large peripheral solid components which is concerning for cystic ovarian malignancy or metastatic disease, or possibly loculated ascites with adjacent peritoneal carcinomatosis. Nonobstructive left nephrolithiasis is noted. Mild bilateral hydroureteronephrosis is noted without obstructing calculus, concerning for distal ureteral obstruction secondary to neoplasm. Aortic Atherosclerosis (ICD10-I70.0). Electronically Signed   By: Lupita Raider M.D.   On: 07/16/2023 11:53   DG Abdomen 1 View Result Date: 07/16/2023 CLINICAL DATA:  Abdominal pain, constipation. EXAM: ABDOMEN - 1 VIEW COMPARISON:  June 07, 2018. FINDINGS: No definite small bowel dilatation is noted. Moderately dilated and air-filled colon is noted most  consistent with ileus. Or clips are noted in the pelvis. IMPRESSION: Moderately dilated and air-filled colon is noted concerning for ileus. Electronically Signed   By: Lupita Raider M.D.   On: 07/16/2023 10:56    PERFORMANCE STATUS (ECOG) : 1 - Symptomatic but completely ambulatory  Review of Systems  Constitutional:  Positive for fatigue.   Unless otherwise noted, a complete review of systems is negative.  Physical Exam General: NAD Cardiovascular: regular rate and rhythm Pulmonary: clear ant fields Abdomen: soft, nontender, + bowel sounds Extremities: no edema, no joint deformities Skin: no rashes Neurological: Alert and oriented x3  IMPRESSION: Discussed the use of AI scribe software for clinical note transcription with the patient, who gave verbal consent to proceed.  History of Present Illness TAWANIA DAPONTE "Andrey Campanile" is a 68 year old female with  metastatic ovarian cancer who presents for her initial palliative care visit here at the cancer center. Patient initially seen by our team during her recent hospitalization. She is accompanied by her sister, Clydie Braun. No acute distress. Patient is ambulatory. Able to engage appropriately in discussions.   I introduced myself, [place] RN, and Palliative's role in collaboration with the oncology team. Concept of Palliative Care was introduced as specialized medical care for people and their families living with serious illness.  It focuses on providing relief from the symptoms and stress of a serious illness.  The goal is to improve quality of life for both the patient and the family. Values and goals of care important to patient and family were attempted to be elicited.   Mrs. Kunka lives in the home with her husband of more than 10 years. No children. 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 She is no longer working at this time to allow time for rest and to care for herself. Family is  important to her. She has not been able to do so recently but she enjoys playing golf in addition to reading and doing things with her church when able.   She is currently undergoing chemotherapy, which significantly impacts her appetite and energy levels, particularly after sessions for several days. Her appetite fluctuates, with some days being better than others. She consumes [employer]life shakes, sometimes with added ice cream, and eats protein-rich foods like chicken and egg salad to maintain her strength. Occasionally, she craves creamy peanut butter. We discussed ways to continue increasing her protein intake and nutritional intake. She does feel that her appetite is improving.  Her current weight is stable at 134 pounds.  She is managing an ostomy, which she is still adjusting to, having changed it three times with assistance. She experiences some anxiety about the ostomy care but is gradually becoming more comfortable with the process.  Shares that she has an upcoming appointment with hopes of being able to reintroduce certain foods such as fresh fruits and vegetables.  She would love to eat a salad.  Her energy levels are notably low, especially after chemotherapy sessions, affecting her ability to engage in physical activities. Despite this, she managed to walk a mile recently, which she finds beneficial for her back pain.   Mrs. Friedl endorses back pain described as an ache, sometimes accompanied by lower abdominal spasms. For pain management, she takes 500 mg of Tylenol and 0.5 mg of oxycodone typically every six to eight hours, as needed.  We discussed use of pain regimen in the setting of pain with hopes of not allowing pain to get out of control.  She does acknowledge that she needs to do a better job at managing her pain and not limiting herself.  Andrey Campanile reports issues with urinary urgency, which she attributes to her recent surgery. She has been checked for a UTI, which was negative, and she  is drinking cranberry juice to help manage symptoms. She also experiences bowel changes, with stool consistency described as 'like pudding.' She is on a regimen of two Colace daily to manage bowel movements.  She has a history of anxiety, for which she occasionally takes Xanax, especially when experiencing back spasms.   Goals of care We discussed her current illness and what it means in the larger context of her on-going co-morbidities. Natural disease trajectory and expectations were discussed.  Mrs. White and her sister are realistic in their  understanding of her cancer diagnosis.  She is clear and expressed wishes to continue to treat the treatable allow her every opportunity to continue to thrive.  I empathetically approach discussions regarding healthcare limitations and advanced directives.  Patient has a documented advanced directive which she would like to be placed into her chart.  She has identified her husband Remi Deter and Sister Clydie Braun as her medical decision makers in the event she cannot speak for herself.  We discussed other limitations specific to CODE STATUS.  Patient is clear that she would not wish to be placed on life support confirming wishes for DNR/DNI.  Education provided on MOST form.  She would like to review document and discuss further with her family to go into more detail regarding her wishes.  I discussed the importance of continued conversation with family and their medical providers regarding overall plan of care and treatment options, ensuring decisions are within the context of the patients values and GOCs.  PLAN: Established therapeutic relationship. Education provided on palliative's role in collaboration with their Oncology/Radiation team. Assessment & Plan Cancer-related symptom management Undergoing chemotherapy causing fatigue, appetite issues, and pain. Manages pain with Tylenol and oxycodone. Avoids certain foods due to dietary restrictions. - Continue  chemotherapy regimen per oncologist. -Discussed use of Fairlife shakes and protein powder to allow more protein and variety in options. - Take Tylenol 500 mg and oxycodone 0.5 mg as needed for pain, every 6-8 hours. - Encourage walking to improve appetite and manage back pain. - Avoid fresh fruits, nuts, and green leafy vegetables until cleared. - Report any significant changes in symptoms.  Post-surgical recovery Recovering from surgery with urinary urgency and managing an ostomy with some anxiety at times. - Monitor urinary symptoms and report if she persists. - Continue to manage ostomy care with guidance from the ostomy clinic. - Attend follow-up appointment with the ostomy clinic.  Anxiety Experiences anxiety related to medical condition and ostomy management.  - Xanax as needed for anxiety. - Continue to engage in activities that provide emotional support, such as spending time with family and participating in church activities.  Goals of Care Has healthcare power of attorney and expressed wishes regarding end-of-life care. Prefers no artificial nutrition or life support if unlikely to lead to recovery. - Scan and upload healthcare power of attorney to the medical record. - Discuss and complete the MOST form, considering preferences for end-of-life care at future appointments. She will review form with husband and family.   I will plan to see patient back in 3-4 weeks in collaboration with her oncology visits.  Sooner if needed.  Patient expressed understanding and was in agreement with this plan. She also understands that She can call the clinic at any time with any questions, concerns, or complaints.   Thank you for your referral and allowing Palliative to assist in Mrs. Nadyne Coombes Delbene's care.   Number and complexity of problems addressed: HIGH - 1 or more chronic illnesses with SEVERE exacerbation, progression, or side effects of treatment - advanced cancer, pain. Any  controlled substances utilized were prescribed in the context of palliative care.   Visit consisted of counseling and education dealing with the complex and emotionally intense issues of symptom management and palliative care in the setting of serious and potentially life-threatening illness.  Signed by: Willette Alma, AGPCNP-BC Palliative Medicine Team/[facility]

## 2023-08-10 NOTE — Assessment & Plan Note (Addendum)
 Continue oxycodone prn, stable

## 2023-08-10 NOTE — Patient Instructions (Signed)
 [name] Kitchen

## 2023-08-11 ENCOUNTER — Ambulatory Visit (HOSPITAL_COMMUNITY)
Admission: RE | Admit: 2023-08-11 | Discharge: 2023-08-11 | Disposition: A | Discharge status: Home / Self Care | Source: Ambulatory Visit | Attending: Nurse Practitioner | Admitting: Nurse Practitioner

## 2023-08-11 ENCOUNTER — Other Ambulatory Visit (HOSPITAL_COMMUNITY): Payer: Self-pay | Admitting: Nurse Practitioner

## 2023-08-11 DIAGNOSIS — L24B3 Irritant contact dermatitis related to fecal or urinary stoma or fistula: Secondary | ICD-10-CM | POA: Insufficient documentation

## 2023-08-11 DIAGNOSIS — K9409 Other complications of colostomy: Secondary | ICD-10-CM | POA: Diagnosis present

## 2023-08-11 DIAGNOSIS — Z433 Encounter for attention to colostomy: Secondary | ICD-10-CM | POA: Diagnosis not present

## 2023-08-11 NOTE — Progress Notes (Signed)
 [facility]   Reason for visit:  LLq colostomy HPI:  Diverticulitis with perforation and end colostomy Past Medical History:  Diagnosis Date   Anxiety state, unspecified    Diverticulosis    Esophageal reflux    Family history of breast cancer    Family history of lymphoma    Family history of stomach cancer    Family history of uterine cancer    Fibroid tumor    Headache    due to allergies    Hiatal hernia    Hyperlipidemia    IBS (irritable bowel syndrome)    Internal hemorrhoids    Nonspecific elevation of levels of transaminase or lactic acid dehydrogenase (LDH)    ovarian ca dx'd 11/2018   Renal stone 07/2013   Tubulovillous adenoma of colon    Varicose veins    superficail thrombophlebitis (left LE)   Family History  Problem Relation Age of Onset   Lymphoma Father        dx 75s   Heart disease Father    Hypertension Father    Hypertension Mother    Hyperlipidemia Mother    Breast cancer Mother 27   Diabetes Maternal Grandmother    Uterine cancer Maternal Grandmother        dx 34s   Stomach cancer Paternal Grandfather    Breast cancer Other        grandmother's sisters, both dx 82s   Colon cancer Neg Hx    Allergies  Allergen Reactions   Carboplatin Other (See Comments)    Chest tightness/burning, flushing.  See Progress Note from 12/29/2022.   Skin Adhesives [Cyanoacrylate] Hives   Shrimp [Shellfish Allergy] Itching   Sulfasalazine Nausea And Vomiting   Sulfonamide Derivatives Nausea Only   Telithromycin     Other Reaction(s): severe dizziness   Current Outpatient Medications  Medication Sig Dispense Refill Last Dose/Taking   amoxicillin (AMOXIL) 500 MG tablet Take 1 tablet (500 mg total) by mouth 2 (two) times daily. 10 tablet 0    acetaminophen (TYLENOL) 500 MG tablet Take 500 mg by mouth every 6 (six) hours as needed for mild pain (pain score 1-3) or moderate pain (pain score 4-6).      ALPRAZolam (XANAX) 0.25 MG tablet Take 1 tablet  (0.25 mg total) by mouth 2 (two) times daily as needed for anxiety or sleep. 60 tablet 0    APIXABAN (ELIQUIS) VTE STARTER PACK (10MG  AND 5MG ) Take as directed on package: start with two-5mg  tablets twice daily for 7 days. On day 8, switch to one-5mg  tablet twice daily. 74 each 0    Cholecalciferol (VITAMIN D3) 25 MCG (1000 UT) CAPS Take 1,000 Units by mouth daily.       dexamethasone (DECADRON) 4 MG tablet Take 2 tablets by mouth the morning before chemo and 2 tablets by mouth daily for 2 days after chemotherapy, every 3 weeks x 6 cycles. 24 tablet 6    esomeprazole (NEXIUM) 20 MG capsule Take 20 mg by mouth daily.       famotidine (PEPCID) 20 MG tablet Take 20 mg by mouth as needed for heartburn or indigestion.      lidocaine-prilocaine (EMLA) cream Apply topically as needed. 30 g 1    magnesium oxide (MAG-OX) 400 (240 Mg) MG tablet Take 1 tablet (400 mg total) by mouth daily. 30 tablet 1    Multiple Vitamins-Minerals (AIRBORNE) CHEW Chew 1 each by mouth in the morning, at noon, and at bedtime.  Multiple Vitamins-Minerals (MULTI FOR HER PO) Take 1 tablet by mouth daily.      ondansetron (ZOFRAN-ODT) 4 MG disintegrating tablet Take 1 tablet (4 mg total) by mouth every 8 (eight) hours as needed for nausea or vomiting. 20 tablet 0    oxyCODONE (OXY IR/ROXICODONE) 5 MG immediate release tablet Take 1 tablet (5 mg total) by mouth every 6 (six) hours as needed for severe pain (pain score 7-10) or moderate pain (pain score 4-6). 90 tablet 0    polyethylene glycol (MIRALAX / GLYCOLAX) 17 g packet Take 17 g by mouth 2 (two) times daily. 14 each 0    prochlorperazine (COMPAZINE) 10 MG tablet Take 1 tablet by mouth every 6 hours as needed (Nausea or vomiting). 30 tablet 1    No current facility-administered medications for this encounter.   ROS  Review of Systems  Constitutional:  Positive for fatigue.  Gastrointestinal:        LLQ colostomy  Genitourinary:        Has appointment with MD for  urinary frequency and burning.   Skin:  Positive for color change and rash.       Peristomal irritation  Psychiatric/Behavioral:  Positive for dysphoric mood.   All other systems reviewed and are negative.  Vital signs:  BP 120/77 (BP Location: Right Arm)   Pulse 70   Temp 97.6 F (36.4 C) (Oral)   Resp 18   SpO2 99%  Exam:  Physical Exam Vitals reviewed.  Constitutional:      Appearance: Normal appearance.  HENT:     Mouth/Throat:     Mouth: Mucous membranes are moist.  Cardiovascular:     Rate and Rhythm: Normal rate and regular rhythm.     Pulses: Normal pulses.     Heart sounds: Normal heart sounds.  Pulmonary:     Breath sounds: Normal breath sounds.  Abdominal:     Palpations: Abdomen is soft.  Skin:    General: Skin is warm and dry.     Findings: Erythema present.  Neurological:     Mental Status: She is alert and oriented to person, place, and time.  Psychiatric:        Behavior: Behavior normal.     Comments: Anxiety over ostomy care, potential for leaks     Stoma type/location:  LLQ colostomy Stomal assessment/size:  1 3/8" slightly budded  Peristomal assessment:  creasing at 12 o'clock.  Peristomal erythema.  Pouch was leaking when removed today.  Treatment options for stomal/peristomal skin: stoma powder and skin prep  barrier ring and 2 piece convex pouch Output: soft brown stool Ostomy pouching: 2pc.  Education provided:  pouch change performed.  Protected irritated skin with stoma powder and skin prep with barrier ring     Impression/dx  Contact irritant dermatitis colostomy Discussion  See above  ongoing ostomy education Plan  See back as needed.     Visit time: 45 minutes.   Mike Gip FNP-BC

## 2023-08-14 ENCOUNTER — Telehealth: Payer: Self-pay

## 2023-08-14 ENCOUNTER — Other Ambulatory Visit: Payer: Self-pay

## 2023-08-14 ENCOUNTER — Other Ambulatory Visit: Payer: Self-pay | Admitting: Hematology and Oncology

## 2023-08-14 ENCOUNTER — Ambulatory Visit (HOSPITAL_COMMUNITY): Admitting: Nurse Practitioner

## 2023-08-14 ENCOUNTER — Inpatient Hospital Stay

## 2023-08-14 DIAGNOSIS — C562 Malignant neoplasm of left ovary: Secondary | ICD-10-CM

## 2023-08-14 DIAGNOSIS — Z5111 Encounter for antineoplastic chemotherapy: Secondary | ICD-10-CM | POA: Diagnosis not present

## 2023-08-14 DIAGNOSIS — R300 Dysuria: Secondary | ICD-10-CM

## 2023-08-14 LAB — URINALYSIS, COMPLETE (UACMP) WITH MICROSCOPIC
Bilirubin Urine: NEGATIVE
Glucose, UA: NEGATIVE mg/dL
Ketones, ur: NEGATIVE mg/dL
Leukocytes,Ua: NEGATIVE
Nitrite: NEGATIVE
Protein, ur: NEGATIVE mg/dL
Specific Gravity, Urine: 1.008 (ref 1.005–1.030)
pH: 6 (ref 5.0–8.0)

## 2023-08-14 NOTE — Telephone Encounter (Signed)
 Returned her call. She is complaining of urinary pressure and urgency. Denies burning and fever. She urinates a small amount and feels better for a little while and then she has the urinary pressure and urgency starts back up. She is still taking oxycodone and tylenol for her pain.

## 2023-08-14 NOTE — Telephone Encounter (Signed)
 Called her back and added appt at 1230 for today for UA. She is aware of appt.

## 2023-08-14 NOTE — Telephone Encounter (Signed)
 Can she come in for UA and UCx? Will call her with results I will place order

## 2023-08-16 ENCOUNTER — Other Ambulatory Visit: Payer: Self-pay | Admitting: Hematology and Oncology

## 2023-08-16 ENCOUNTER — Telehealth: Payer: Self-pay

## 2023-08-16 DIAGNOSIS — N390 Urinary tract infection, site not specified: Secondary | ICD-10-CM | POA: Insufficient documentation

## 2023-08-16 LAB — URINE CULTURE: Culture: 20000 — AB

## 2023-08-16 MED ORDER — AMOXICILLIN 500 MG PO TABS: 500.0000 mg | ORAL_TABLET | Freq: Two times a day (BID) | ORAL | 0 refills | Status: DC

## 2023-08-16 NOTE — Telephone Encounter (Signed)
-----   Message from Artis Delay sent at 08/16/2023  8:55 AM EDT ----- Pls call her, urine culture is positive for UTI I sent antibiotics to her local pharmacy

## 2023-08-16 NOTE — Telephone Encounter (Signed)
 Called pt to make aware. She verbalized thanks and mentions that her foot has started to hurt and is having some swelling there. She thinks this could be a flare up of her plantar fasciatis. Advised pt to call her PCP for referral to podiatry if needed to assist further in this. Just verbalized agreement.

## 2023-08-17 ENCOUNTER — Telehealth: Payer: Self-pay

## 2023-08-17 NOTE — Telephone Encounter (Signed)
 She called and left a message. She has started having some right leg swelling when she started home PT. She has a history of plantar fascitis. The swelling goes away at night. Denies redness and her leg does not feel hot.  She is asking for Dr. [name] recommendations and wants her to be aware.

## 2023-08-17 NOTE — Telephone Encounter (Signed)
 Called and given below message. She verbalized understanding.

## 2023-08-17 NOTE — Telephone Encounter (Signed)
 If swelling goes away at night, I recommend using tight elastic compression stockings during day time

## 2023-08-18 ENCOUNTER — Other Ambulatory Visit (HOSPITAL_COMMUNITY): Payer: Self-pay

## 2023-08-18 ENCOUNTER — Ambulatory Visit (HOSPITAL_BASED_OUTPATIENT_CLINIC_OR_DEPARTMENT_OTHER)
Admission: RE | Admit: 2023-08-18 | Discharge: 2023-08-18 | Disposition: A | Discharge status: Home / Self Care | Source: Ambulatory Visit | Attending: General Surgery | Admitting: General Surgery

## 2023-08-18 ENCOUNTER — Inpatient Hospital Stay: Admitting: Hematology and Oncology

## 2023-08-18 ENCOUNTER — Ambulatory Visit

## 2023-08-18 ENCOUNTER — Encounter: Payer: Self-pay | Admitting: Hematology and Oncology

## 2023-08-18 ENCOUNTER — Other Ambulatory Visit (HOSPITAL_COMMUNITY): Payer: Self-pay | Admitting: General Surgery

## 2023-08-18 ENCOUNTER — Other Ambulatory Visit: Payer: Self-pay | Admitting: Hematology and Oncology

## 2023-08-18 ENCOUNTER — Ambulatory Visit (HOSPITAL_COMMUNITY)
Admission: RE | Admit: 2023-08-18 | Discharge: 2023-08-18 | Disposition: A | Discharge status: Home / Self Care | Source: Ambulatory Visit | Attending: General Surgery | Admitting: General Surgery

## 2023-08-18 DIAGNOSIS — I82421 Acute embolism and thrombosis of right iliac vein: Secondary | ICD-10-CM | POA: Diagnosis not present

## 2023-08-18 DIAGNOSIS — I82411 Acute embolism and thrombosis of right femoral vein: Secondary | ICD-10-CM | POA: Diagnosis not present

## 2023-08-18 DIAGNOSIS — K9400 Colostomy complication, unspecified: Secondary | ICD-10-CM | POA: Diagnosis not present

## 2023-08-18 DIAGNOSIS — M7989 Other specified soft tissue disorders: Secondary | ICD-10-CM

## 2023-08-18 DIAGNOSIS — Z5111 Encounter for antineoplastic chemotherapy: Secondary | ICD-10-CM | POA: Diagnosis not present

## 2023-08-18 DIAGNOSIS — I82401 Acute embolism and thrombosis of unspecified deep veins of right lower extremity: Secondary | ICD-10-CM | POA: Diagnosis not present

## 2023-08-18 DIAGNOSIS — N390 Urinary tract infection, site not specified: Secondary | ICD-10-CM | POA: Diagnosis not present

## 2023-08-18 DIAGNOSIS — C562 Malignant neoplasm of left ovary: Secondary | ICD-10-CM | POA: Diagnosis not present

## 2023-08-18 MED ORDER — APIXABAN (ELIQUIS) VTE STARTER PACK (10MG AND 5MG)
ORAL_TABLET | ORAL | 0 refills | Status: DC
  Filled 2023-08-18: qty 74, 30d supply, fill #0

## 2023-08-18 NOTE — Progress Notes (Signed)
 [facility] OFFICE PROGRESS NOTE  Patient Care Team: Geoffry Paradise, MD as PCP - General (Internal Medicine) Leanora Ivanoff, RN as Registered Nurse Griselda Miner, MD as Consulting Physician (General Surgery) Artis Delay, MD as Consulting Physician (Hematology and Oncology) Adolphus Birchwood, MD (Gynecologic Oncology) Pyrtle, Carie Caddy, MD as Consulting Physician (Gastroenterology) Pickenpack-Cousar, Arty Baumgartner, NP as Nurse Practitioner (Hospice and Palliative Medicine)  Assessment & Plan Acute deep vein thrombosis (DVT) of right lower extremity, unspecified vein (HCC) She developed acute DVT likely related to recent surgery and hospitalization She has no signs or symptoms of bleeding I recommend treating her with apixaban She is given instruction to take 10 mg twice daily for the first week before switching over to 5 mg twice daily after the first week We discussed signs and symptoms to watch out for bleeding Will see her again next week for further follow-up Urinary tract infection without hematuria, site unspecified She is prescribed antibiotics for urinary tract infection She felt a bit better I plan to repeat urine analysis and urine culture next week We discussed importance of increase oral fluid hydration Left ovarian epithelial cancer (HCC) Her treatment course is complicated by acute DVT and urinary tract infection Depending on her blood count next week, I will consider dose reduction moving forward  Orders Placed This Encounter  Procedures   Urine Culture    Standing Status:   Future    Expected Date:   08/24/2023    Expiration Date:   08/17/2024   Urinalysis, Complete w Microscopic    Standing Status:   Future    Expected Date:   08/24/2023    Expiration Date:   08/17/2024     Artis Delay, MD  INTERVAL HISTORY: she returns for urgent evaluation She was seen by general surgery today She complained of leg swelling She underwent urgent ultrasound evaluation and  was found to have acute DVT and is being referred here for management Ultrasound venous Doppler showed: - Findings consistent with acute deep vein thrombosis involving the external iliac vein, right common femoral vein, SF junction, right femoral  vein, right proximal profunda vein, and right popliteal vein. Findings consistent with acute intramuscular thrombosis involving the right gastrocnemius veins.  She does not have acute DVT on the left She is taking antibiotics for urinary tract infection and felt a bit better Denies fever or chills No recent bleeding We discussed risk and benefits of anticoagulation therapy and duration of treatment would be minimum 3 to 6 months PHYSICAL EXAMINATION: ECOG PERFORMANCE STATUS: 0 - Asymptomatic  There were no vitals filed for this visit. There were no vitals filed for this visit.  Relevant data reviewed during this visit included venous Doppler ultrasound report

## 2023-08-18 NOTE — Assessment & Plan Note (Addendum)
 She is prescribed antibiotics for urinary tract infection She felt a bit better I plan to repeat urine analysis and urine culture next week We discussed importance of increase oral fluid hydration

## 2023-08-18 NOTE — Progress Notes (Signed)
 [facility]   Reason for visit:  LLQ colostomy, sloughing mucosa HPI:  Peritoneal carcinomatosis Past Medical History:  Diagnosis Date   Anxiety state, unspecified    Diverticulosis    Esophageal reflux    Family history of breast cancer    Family history of lymphoma    Family history of stomach cancer    Family history of uterine cancer    Fibroid tumor    Headache    due to allergies    Hiatal hernia    Hyperlipidemia    IBS (irritable bowel syndrome)    Internal hemorrhoids    Nonspecific elevation of levels of transaminase or lactic acid dehydrogenase (LDH)    ovarian ca dx'd 11/2018   Renal stone 07/2013   Tubulovillous adenoma of colon    Varicose veins    superficail thrombophlebitis (left LE)   Family History  Problem Relation Age of Onset   Lymphoma Father        dx 56s   Heart disease Father    Hypertension Father    Hypertension Mother    Hyperlipidemia Mother    Breast cancer Mother 61   Diabetes Maternal Grandmother    Uterine cancer Maternal Grandmother        dx 80s   Stomach cancer Paternal Grandfather    Breast cancer Other        grandmother's sisters, both dx 77s   Colon cancer Neg Hx    Allergies  Allergen Reactions   Carboplatin Other (See Comments)    Chest tightness/burning, flushing.  See Progress Note from 12/29/2022.   Skin Adhesives [Cyanoacrylate] Hives   Shrimp [Shellfish Allergy] Itching   Sulfasalazine Nausea And Vomiting   Sulfonamide Derivatives Nausea Only   Telithromycin     Other Reaction(s): severe dizziness   Current Outpatient Medications  Medication Sig Dispense Refill Last Dose/Taking   acetaminophen (TYLENOL) 500 MG tablet Take 500 mg by mouth every 6 (six) hours as needed for mild pain (pain score 1-3) or moderate pain (pain score 4-6).      ALPRAZolam (XANAX) 0.25 MG tablet Take 1 tablet (0.25 mg total) by mouth 2 (two) times daily as needed for anxiety or sleep. 60 tablet 0    APIXABAN (ELIQUIS) VTE  STARTER PACK (10MG  AND 5MG ) Take as directed on package: start with two-5mg  tablets twice daily for 7 days. On day 8, switch to one-5mg  tablet twice daily. 74 each 0    Cholecalciferol (VITAMIN D3) 25 MCG (1000 UT) CAPS Take 1,000 Units by mouth daily.       dexamethasone (DECADRON) 4 MG tablet Take 2 tablets by mouth the morning before chemo and 2 tablets by mouth daily for 2 days after chemotherapy, every 3 weeks x 6 cycles. 24 tablet 6    esomeprazole (NEXIUM) 20 MG capsule Take 20 mg by mouth daily.       estradiol (ESTRACE VAGINAL) 0.1 MG/GM vaginal cream Place 1 applicatorful vaginally at bedtime as directed 42.5 g 12    famotidine (PEPCID) 20 MG tablet Take 20 mg by mouth as needed for heartburn or indigestion.      lidocaine-prilocaine (EMLA) cream Apply topically as needed. 30 g 1    magnesium oxide (MAG-OX) 400 (240 Mg) MG tablet Take 1 tablet (400 mg total) by mouth daily. 30 tablet 1    Multiple Vitamins-Minerals (AIRBORNE) CHEW Chew 1 each by mouth in the morning, at noon, and at bedtime.      Multiple Vitamins-Minerals (  MULTI FOR HER PO) Take 1 tablet by mouth daily.      ondansetron (ZOFRAN-ODT) 4 MG disintegrating tablet Take 1 tablet (4 mg total) by mouth every 8 (eight) hours as needed for nausea or vomiting. 20 tablet 0    oxyCODONE (OXY IR/ROXICODONE) 5 MG immediate release tablet Take 1 tablet (5 mg total) by mouth every 6 (six) hours as needed for severe pain (pain score 7-10) or moderate pain (pain score 4-6). 90 tablet 0    polyethylene glycol (MIRALAX / GLYCOLAX) 17 g packet Take 17 g by mouth 2 (two) times daily. 14 each 0    prochlorperazine (COMPAZINE) 10 MG tablet Take 1 tablet by mouth every 6 hours as needed (Nausea or vomiting). 30 tablet 1    No current facility-administered medications for this encounter.   ROS  Review of Systems  Constitutional:  Positive for fatigue.  Cardiovascular:  Positive for leg swelling.       Right leg with tenderness- rule out DVT   Gastrointestinal:  Positive for constipation (on colace).       LLQ colostomy  Genitourinary:  Positive for dysuria.       Recent UTI  Musculoskeletal:  Positive for gait problem.  Skin:  Positive for color change (peritomal redness).  Hematological:        Rule out DVT  Psychiatric/Behavioral:  The patient is nervous/anxious.   All other systems reviewed and are negative.  Vital signs:  BP (!) 141/76 (BP Location: Right Arm)   Pulse 83   Temp (!) 97.3 F (36.3 C) (Oral)   Resp 18   SpO2 100%  Exam:  Physical Exam Constitutional:      Appearance: She is ill-appearing (weakness, recent UTI, possible DVT).  HENT:     Mouth/Throat:     Mouth: Mucous membranes are moist.  Cardiovascular:     Rate and Rhythm: Normal rate and regular rhythm.     Pulses: Normal pulses.  Pulmonary:     Effort: Pulmonary effort is normal.  Musculoskeletal:     Comments: Tenderness right leg  Skin:    General: Skin is warm and dry.  Neurological:     Mental Status: She is alert.     Stoma type/location:  LLQ pink moist slightly budded Stomal assessment/size:  sloughing mucosa, 15% yellow slough Peristomal assessment:  intact  stoma flush Treatment options for stomal/peristomal skin: barrier ring, 2 piece convex pouch Output: soft brown stool Ostomy pouching: 2pc.  Education provided:  performed pouch change.  Patient is gaining independence with ostomy care.  HH nurse helping at home.  Right leg edema and tenderness, is awaiting scan to rule out DVT.      Impression/dx  Colostomy complication Discussion  Convex pouch system with ostomy belt for improved fit.   Plan  See back one week for ongoing teaching/support.     Visit time: 55 minutes.   Mike Gip FNP-BC

## 2023-08-18 NOTE — Assessment & Plan Note (Addendum)
 Her treatment course is complicated by acute DVT and urinary tract infection Depending on her blood count next week, I will consider dose reduction moving forward

## 2023-08-18 NOTE — Progress Notes (Signed)
 RLE venous duplex has been completed.  Preliminary findings given to Vernona Rieger at Dr. Billey Chang office.  Instructed to connect oncologist.  Steward Drone, RN at Dr. Maxine Glenn requesting patient head to Cancer Center for treatment.   Results can be found under chart review under CV PROC. 08/18/2023 1:32 PM Keylah Darwish RVT, RDMS

## 2023-08-18 NOTE — Assessment & Plan Note (Addendum)
 She developed acute DVT likely related to recent surgery and hospitalization She has no signs or symptoms of bleeding I recommend treating her with apixaban She is given instruction to take 10 mg twice daily for the first week before switching over to 5 mg twice daily after the first week We discussed signs and symptoms to watch out for bleeding Will see her again next week for further follow-up

## 2023-08-21 ENCOUNTER — Other Ambulatory Visit (HOSPITAL_COMMUNITY): Payer: Self-pay

## 2023-08-21 ENCOUNTER — Telehealth: Payer: Self-pay | Admitting: *Deleted

## 2023-08-21 ENCOUNTER — Other Ambulatory Visit: Payer: Self-pay | Admitting: Hematology and Oncology

## 2023-08-21 MED ORDER — ESTRADIOL 0.1 MG/GM VA CREA
1.0000 | TOPICAL_CREAM | Freq: Every day | VAGINAL | 12 refills | Status: AC
  Filled 2023-08-21: qty 42.5, 30d supply, fill #0
  Filled 2023-10-11: qty 42.5, 30d supply, fill #1

## 2023-08-21 NOTE — Telephone Encounter (Signed)
 Pt called stating she continues to have UTI symptoms. Per provider, topical estrogen cream will be sent to pharmacy. 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 Pt verbalized understanding.

## 2023-08-21 NOTE — Discharge Instructions (Signed)
 Protect skin with stoma powder and skin prep as needed for redness irritation

## 2023-08-22 ENCOUNTER — Telehealth: Payer: Self-pay

## 2023-08-22 ENCOUNTER — Other Ambulatory Visit: Payer: Self-pay | Admitting: Hematology and Oncology

## 2023-08-22 DIAGNOSIS — C562 Malignant neoplasm of left ovary: Secondary | ICD-10-CM

## 2023-08-22 DIAGNOSIS — I82401 Acute embolism and thrombosis of unspecified deep veins of right lower extremity: Secondary | ICD-10-CM

## 2023-08-22 NOTE — Telephone Encounter (Signed)
 She called me back. She's not able to come in today as she doesn't have transportation, but she accepted an appt for port flush w/lab tomorrow morning at 0845. She will prepare to give [place] a urine sample.

## 2023-08-22 NOTE — Telephone Encounter (Signed)
 Bring her in for labs (cbc and cmp) and repeat UA and UCx today I will place orders

## 2023-08-22 NOTE — Telephone Encounter (Signed)
 Pt called and states she started using Estradiol as prescribed by MD 3/31 and her sx have worsened. She reports her urinary urgency increased and she was awake every hour going to urinate and also reports burning. She is asking for advice. Message routed to MD for further review

## 2023-08-23 ENCOUNTER — Inpatient Hospital Stay: Attending: Gynecologic Oncology

## 2023-08-23 DIAGNOSIS — M549 Dorsalgia, unspecified: Secondary | ICD-10-CM | POA: Diagnosis not present

## 2023-08-23 DIAGNOSIS — C786 Secondary malignant neoplasm of retroperitoneum and peritoneum: Secondary | ICD-10-CM | POA: Diagnosis not present

## 2023-08-23 DIAGNOSIS — Z79633 Long term (current) use of mitotic inhibitor: Secondary | ICD-10-CM | POA: Diagnosis not present

## 2023-08-23 DIAGNOSIS — D6481 Anemia due to antineoplastic chemotherapy: Secondary | ICD-10-CM | POA: Diagnosis not present

## 2023-08-23 DIAGNOSIS — C562 Malignant neoplasm of left ovary: Secondary | ICD-10-CM

## 2023-08-23 DIAGNOSIS — E876 Hypokalemia: Secondary | ICD-10-CM | POA: Insufficient documentation

## 2023-08-23 DIAGNOSIS — N390 Urinary tract infection, site not specified: Secondary | ICD-10-CM

## 2023-08-23 DIAGNOSIS — Z5111 Encounter for antineoplastic chemotherapy: Secondary | ICD-10-CM | POA: Insufficient documentation

## 2023-08-23 DIAGNOSIS — Z7189 Other specified counseling: Secondary | ICD-10-CM

## 2023-08-23 DIAGNOSIS — I82401 Acute embolism and thrombosis of unspecified deep veins of right lower extremity: Secondary | ICD-10-CM

## 2023-08-23 DIAGNOSIS — C569 Malignant neoplasm of unspecified ovary: Secondary | ICD-10-CM | POA: Insufficient documentation

## 2023-08-23 LAB — CBC WITH DIFFERENTIAL (CANCER CENTER ONLY)
Abs Immature Granulocytes: 0.08 10*3/uL — ABNORMAL HIGH (ref 0.00–0.07)
Basophils Absolute: 0 10*3/uL (ref 0.0–0.1)
Basophils Relative: 0 %
Eosinophils Absolute: 0 10*3/uL (ref 0.0–0.5)
Eosinophils Relative: 0 %
HCT: 24.8 % — ABNORMAL LOW (ref 36.0–46.0)
Hemoglobin: 8.1 g/dL — ABNORMAL LOW (ref 12.0–15.0)
Immature Granulocytes: 1 %
Lymphocytes Relative: 10 %
Lymphs Abs: 1.2 10*3/uL (ref 0.7–4.0)
MCH: 29.1 pg (ref 26.0–34.0)
MCHC: 32.7 g/dL (ref 30.0–36.0)
MCV: 89.2 fL (ref 80.0–100.0)
Monocytes Absolute: 1.1 10*3/uL — ABNORMAL HIGH (ref 0.1–1.0)
Monocytes Relative: 9 %
Neutro Abs: 10.4 10*3/uL — ABNORMAL HIGH (ref 1.7–7.7)
Neutrophils Relative %: 80 %
Platelet Count: 392 10*3/uL (ref 150–400)
RBC: 2.78 MIL/uL — ABNORMAL LOW (ref 3.87–5.11)
RDW: 15.6 % — ABNORMAL HIGH (ref 11.5–15.5)
WBC Count: 12.9 10*3/uL — ABNORMAL HIGH (ref 4.0–10.5)
nRBC: 0 % (ref 0.0–0.2)

## 2023-08-23 LAB — URINALYSIS, COMPLETE (UACMP) WITH MICROSCOPIC
Bacteria, UA: NONE SEEN
Bilirubin Urine: NEGATIVE
Glucose, UA: NEGATIVE mg/dL
Ketones, ur: NEGATIVE mg/dL
Leukocytes,Ua: NEGATIVE
Nitrite: NEGATIVE
Protein, ur: NEGATIVE mg/dL
Specific Gravity, Urine: 1.008 (ref 1.005–1.030)
pH: 6 (ref 5.0–8.0)

## 2023-08-23 LAB — CMP (CANCER CENTER ONLY)
ALT: 7 U/L (ref 0–44)
AST: 14 U/L — ABNORMAL LOW (ref 15–41)
Albumin: 3.9 g/dL (ref 3.5–5.0)
Alkaline Phosphatase: 177 U/L — ABNORMAL HIGH (ref 38–126)
Anion gap: 8 (ref 5–15)
BUN: 12 mg/dL (ref 8–23)
CO2: 26 mmol/L (ref 22–32)
Calcium: 9.1 mg/dL (ref 8.9–10.3)
Chloride: 102 mmol/L (ref 98–111)
Creatinine: 0.75 mg/dL (ref 0.44–1.00)
GFR, Estimated: >60 mL/min (ref >60)
Glucose, Bld: 114 mg/dL — ABNORMAL HIGH (ref 70–99)
Potassium: 3 mmol/L — ABNORMAL LOW (ref 3.5–5.1)
Sodium: 136 mmol/L (ref 135–145)
Total Bilirubin: 1 mg/dL (ref 0.0–1.2)
Total Protein: 7 g/dL (ref 6.5–8.1)

## 2023-08-23 MED ORDER — HEPARIN SOD (PORK) LOCK FLUSH 100 UNIT/ML IV SOLN
250.0000 [IU] | Freq: Once | INTRAVENOUS | Status: AC
  Administered 2023-08-23: 250 [IU]

## 2023-08-23 MED ORDER — SODIUM CHLORIDE 0.9% FLUSH
10.0000 mL | Freq: Once | INTRAVENOUS | Status: AC
  Administered 2023-08-23: 10 mL

## 2023-08-23 NOTE — Progress Notes (Signed)
 Patient was here for labs.  Informed her she has labs on tomorrow as well as treatment.  She wanted to know if she still needed labs on tomorrow.  Secure chatted Dr. [name] and Lajuana Ripple.  Dr. [name] states ok to cancel her labs scheduled for tomorrow.  Appointment for labs on tomorrow were canceled per Steward Drone.  Patient was informed that labs are canceled, but keep other appointment scheduled for tomorrow

## 2023-08-24 ENCOUNTER — Encounter: Payer: Self-pay | Admitting: Hematology and Oncology

## 2023-08-24 ENCOUNTER — Other Ambulatory Visit

## 2023-08-24 ENCOUNTER — Inpatient Hospital Stay

## 2023-08-24 ENCOUNTER — Inpatient Hospital Stay: Admitting: Hematology and Oncology

## 2023-08-24 ENCOUNTER — Inpatient Hospital Stay: Admitting: Licensed Clinical Social Worker

## 2023-08-24 ENCOUNTER — Telehealth: Payer: Self-pay

## 2023-08-24 ENCOUNTER — Other Ambulatory Visit: Payer: Self-pay

## 2023-08-24 VITALS — BP 135/68 | HR 90 | Temp 99.2°F | Resp 16 | Ht 62.0 in | Wt 136.4 lb

## 2023-08-24 DIAGNOSIS — G893 Neoplasm related pain (acute) (chronic): Secondary | ICD-10-CM | POA: Diagnosis not present

## 2023-08-24 DIAGNOSIS — E876 Hypokalemia: Secondary | ICD-10-CM | POA: Diagnosis not present

## 2023-08-24 DIAGNOSIS — D6481 Anemia due to antineoplastic chemotherapy: Secondary | ICD-10-CM

## 2023-08-24 DIAGNOSIS — C562 Malignant neoplasm of left ovary: Secondary | ICD-10-CM | POA: Diagnosis not present

## 2023-08-24 DIAGNOSIS — I82401 Acute embolism and thrombosis of unspecified deep veins of right lower extremity: Secondary | ICD-10-CM

## 2023-08-24 DIAGNOSIS — Z5111 Encounter for antineoplastic chemotherapy: Secondary | ICD-10-CM | POA: Diagnosis not present

## 2023-08-24 DIAGNOSIS — T451X5A Adverse effect of antineoplastic and immunosuppressive drugs, initial encounter: Secondary | ICD-10-CM

## 2023-08-24 DIAGNOSIS — D61818 Other pancytopenia: Secondary | ICD-10-CM

## 2023-08-24 DIAGNOSIS — Z7189 Other specified counseling: Secondary | ICD-10-CM

## 2023-08-24 DIAGNOSIS — N952 Postmenopausal atrophic vaginitis: Secondary | ICD-10-CM | POA: Insufficient documentation

## 2023-08-24 LAB — URINE CULTURE: Culture: NO GROWTH

## 2023-08-24 MED ORDER — SODIUM CHLORIDE 0.9 % IV SOLN: INTRAVENOUS | Status: DC

## 2023-08-24 MED ORDER — SODIUM CHLORIDE 0.9% FLUSH: 10.0000 mL | INTRAVENOUS | Status: DC | PRN

## 2023-08-24 MED ORDER — DEXAMETHASONE SODIUM PHOSPHATE 10 MG/ML IJ SOLN
10.0000 mg | Freq: Once | INTRAMUSCULAR | Status: AC
  Administered 2023-08-24: 10 mg via INTRAVENOUS
  Filled 2023-08-24: qty 1

## 2023-08-24 MED ORDER — SODIUM CHLORIDE 0.9 % IV SOLN
60.0000 mg/m2 | Freq: Once | INTRAVENOUS | Status: AC
  Administered 2023-08-24: 98 mg via INTRAVENOUS
  Filled 2023-08-24: qty 9.8

## 2023-08-24 MED ORDER — HEPARIN SOD (PORK) LOCK FLUSH 100 UNIT/ML IV SOLN: 500.0000 [IU] | Freq: Once | INTRAVENOUS | Status: DC | PRN

## 2023-08-24 NOTE — Progress Notes (Signed)
 [facility] OFFICE PROGRESS NOTE  Patient Care Team: Geoffry Paradise, MD as PCP - General (Internal Medicine) Leanora Ivanoff, RN as Registered Nurse Griselda Miner, MD as Consulting Physician (General Surgery) Artis Delay, MD as Consulting Physician (Hematology and Oncology) Adolphus Birchwood, MD (Gynecologic Oncology) Pyrtle, Carie Caddy, MD as Consulting Physician (Gastroenterology) Glee Arvin, NP as Nurse Practitioner (Hospice and Palliative Medicine)  Assessment & Plan Acute deep vein thrombosis (DVT) of right lower extremity, unspecified vein (HCC) She has no signs or symptoms of bleeding She will continue anticoagulation therapy Hypokalemia Likely due to poor oral intake I recommend the patient a list of food that is rich in potassium I do not think she needs potassium replacement therapy right now Left ovarian epithelial cancer [facility]) 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 Between 2021-2025, she had multiple disease relapse, progressed on niraparib, paclitaxel, Doxil and bevacizumab, Allergic to carboplatin  Pathology: High grade serous, Her 2 neu 1+ MSI stable, serous, ER 40%, Negative genetics, Folate receptor alpha neg, PD-L1 CPS 1% 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 after presentation with bowel obstruction She is currently on single agent docetaxel, treatment is complicated by severe anemia and recent diagnosis of blood clot We will proceed with treatment with dose reduction Next imaging in May Continue supportive care Cancer associated pain Continue oxycodone prn, stable Anemia due to antineoplastic chemotherapy Her blood count has dropped quite a bit I will reduce the dose of treatment I  will bring her back next week to get her blood count rechecked and she will receive a unit of blood if hemoglobin is less than 8 Atrophic vaginitis She has sensation of urinary frequency She just completed a course of antibiotics and urinalysis showed no signs of bacteria although cultures are pending Will call her with test results I suspect she might have atrophic vaginitis Recommend trial of topical estrogen cream  Orders Placed This Encounter  Procedures   CBC with Differential/Platelet    Standing Status:   Standing    Number of Occurrences:   22    Expiration Date:   08/23/2024   Magnesium    Standing Status:   Standing    Number of Occurrences:   5    Expiration Date:   08/23/2024   Sample to Blood Bank    Standing Status:   Standing    Number of Occurrences:   33    Expiration Date:   08/23/2024     Artis Delay, MD  INTERVAL HISTORY: she returns for treatment follow-up Complications related to previous cycle of chemotherapy included anemia,, cancer associated pain,, leg edema, and sensation of urinary frequency She denies worsening neuropathy She has persistent leg swelling The patient denies any recent signs or symptoms of bleeding such as spontaneous epistaxis, hematuria or hematochezia.  PHYSICAL EXAMINATION: ECOG PERFORMANCE STATUS: 1 - Symptomatic but completely ambulatory  Vitals:   08/24/23 1054  BP: 135/68  Pulse: 90  Resp: 16  Temp: 99.2 F (37.3 C)  SpO2: 99%   Filed Weights   08/24/23 1054  Weight: 136 lb 6 oz (61.9 kg)    Relevant data reviewed during this visit included CBC and CMP and urinalysis

## 2023-08-24 NOTE — Telephone Encounter (Signed)
-----   Message from Artis Delay sent at 08/24/2023  3:04 PM EDT ----- Let her know urine culture is neg

## 2023-08-24 NOTE — Progress Notes (Signed)
 [facility] CSW Progress Note  Visual merchandiser met with patient to follow-up on coping. Pt has been having a difficult time since last visit as she had a blood clot and her energy is extremely low.  CSW provided space for pt to share about her experience. Pt continues to work on a hopeful outlook and is recognizing that she is needing to focus more on herself now.  Discussed noticing what gives her energy vs draining her energy and setting boundaries around those that drain and incorporating more that increase energy when possible. Pt is also planning to reach out to a former pastor that she has a connection with when she has the energy.      Eldana Isip E Mashelle Busick, LCSW Clinical Social Worker [facility]

## 2023-08-24 NOTE — Assessment & Plan Note (Addendum)
 She has no signs or symptoms of bleeding She will continue anticoagulation therapy

## 2023-08-24 NOTE — Assessment & Plan Note (Addendum)
 She has sensation of urinary frequency She just completed a course of antibiotics and urinalysis showed no signs of bacteria although cultures are pending Will call her with test results I suspect she might have atrophic vaginitis Recommend trial of topical estrogen cream

## 2023-08-24 NOTE — Assessment & Plan Note (Addendum)
 Continue oxycodone prn, stable

## 2023-08-24 NOTE — Patient Instructions (Signed)
 [facility] - A DEPT OF [facility]  Discharge Instructions: Thank you for choosing [facility] to provide your oncology and hematology care.   If you have a lab appointment with the Cancer Center, please go directly to the Cancer Center and check in at the registration area.   Wear comfortable clothing and clothing appropriate for easy access to any Portacath or PICC line.   We strive to give you quality time with your provider. You may need to reschedule your appointment if you arrive late (15 or more minutes).  Arriving late affects you and other patients whose appointments are after yours.  Also, if you miss three or more appointments without notifying the office, you may be dismissed from the clinic at the provider's discretion.      For prescription refill requests, have your pharmacy contact our office and allow 72 hours for refills to be completed.    Today you received the following chemotherapy and/or immunotherapy agents Taxotere   To help prevent nausea and vomiting after your treatment, we encourage you to take your nausea medication as directed.  BELOW ARE SYMPTOMS THAT SHOULD BE REPORTED IMMEDIATELY: *FEVER GREATER THAN 100.4 F (38 C) OR HIGHER *CHILLS OR SWEATING *NAUSEA AND VOMITING THAT IS NOT CONTROLLED WITH YOUR NAUSEA MEDICATION *UNUSUAL SHORTNESS OF BREATH *UNUSUAL BRUISING OR BLEEDING *URINARY PROBLEMS (pain or burning when urinating, or frequent urination) *BOWEL PROBLEMS (unusual diarrhea, constipation, pain near the anus) TENDERNESS IN MOUTH AND THROAT WITH OR WITHOUT PRESENCE OF ULCERS (sore throat, sores in mouth, or a toothache) UNUSUAL RASH, SWELLING OR PAIN  UNUSUAL VAGINAL DISCHARGE OR ITCHING   Items with * indicate a potential emergency and should be followed up as soon as possible or go to the Emergency Department if any problems should occur.  Please show the CHEMOTHERAPY ALERT CARD or IMMUNOTHERAPY ALERT  CARD at check-in to the Emergency Department and triage nurse.  Should you have questions after your visit or need to cancel or reschedule your appointment, please contact [facility] - A DEPT OF [facility]  Dept: [phone]  and follow the prompts.  Office hours are 8:00 a.m. to 4:30 p.m. Monday - Friday. Please note that voicemails left after 4:00 p.m. may not be returned until the following business day.  We are closed weekends and major holidays. You have access to a nurse at all times for urgent questions. Please call the main number to the clinic Dept: [phone] and follow the prompts.   For any non-urgent questions, you may also contact your provider using MyChart. We now offer e-Visits for anyone 80 and older to request care online for non-urgent symptoms. For details visit [email or web address].   Also download the MyChart app! Go to the app store, search "MyChart", open the app, select [place], and log in with your MyChart username and password. Docetaxel Injection What is this medication? DOCETAXEL (doe se TAX el) treats some types of cancer. It works by slowing down the growth of cancer cells. This medicine may be used for other purposes; ask your health care provider or pharmacist if you have questions. COMMON BRAND NAME(S): BEIZRAY, Docefrez, Docivyx, Taxotere What should I tell my care team before I take this medication? They need to know if you have any of these conditions: Kidney disease Liver disease Low white blood cell levels Tingling of the fingers or toes or other nerve disorder An unusual  or allergic reaction to docetaxel, polysorbate 80, other medications, foods, dyes, or preservatives Pregnant or trying to get pregnant Breast-feeding How should I use this medication? This medication is injected into a vein. It is given by your care team in a hospital or clinic setting. Talk to your care team about the use of this medication  in children. Special care may be needed. Overdosage: If you think you have taken too much of this medicine contact a poison control center or emergency room at once. NOTE: This medicine is only for you. Do not share this medicine with others. What if I miss a dose? Keep appointments for follow-up doses. It is important not to miss your dose. Call your care team if you are unable to keep an appointment. What may interact with this medication? Do not take this medication with any of the following: Live virus vaccines This medication may also interact with the following: Certain antibiotics, such as clarithromycin, telithromycin Certain antivirals for HIV or hepatitis Certain medications for fungal infections, such as itraconazole, ketoconazole, voriconazole Grapefruit juice Nefazodone Supplements, such as St. John's wort This list may not describe all possible interactions. Give your health care provider a list of all the medicines, herbs, non-prescription drugs, or dietary supplements you use. Also tell them if you smoke, drink alcohol, or use illegal drugs. Some items may interact with your medicine. What should I watch for while using this medication? This medication may make you feel generally unwell. This is not uncommon as chemotherapy can affect healthy cells as well as cancer cells. Report any side effects. Continue your course of treatment even though you feel ill unless your care team tells you to stop. You may need blood work done while you are taking this medication. This medication can cause serious side effects and infusion reactions. To reduce the risk, your care team may give you other medications to take before receiving this one. Be sure to follow the directions from your care team. This medication may increase your risk of getting an infection. Call your care team for advice if you get a fever, chills, sore throat, or other symptoms of a cold or flu. Do not treat yourself. Try to  avoid being around people who are sick. Avoid taking medications that contain aspirin, acetaminophen, ibuprofen, naproxen, or ketoprofen unless instructed by your care team. These medications may hide a fever. Be careful brushing or flossing your teeth or using a toothpick because you may get an infection or bleed more easily. If you have any dental work done, tell your dentist you are receiving this medication. Some products may contain alcohol. Ask your care team if this medication contains alcohol. Be sure to tell all care teams you are taking this medicine. Certain medications, like metronidazole and disulfiram, can cause an unpleasant reaction when taken with alcohol. The reaction includes flushing, headache, nausea, vomiting, sweating, and increased thirst. The reaction can last from 30 minutes to several hours. This medication may affect your coordination, reaction time, or judgement. Do not drive or operate machinery until you know how this medication affects you. Sit up or stand slowly to reduce the risk of dizzy or fainting spells. Drinking alcohol with this medication can increase the risk of these side effects. Talk to your care team about your risk of cancer. You may be more at risk for certain types of cancer if you take this medication. Talk to your care team if you wish to become pregnant or think you might be  pregnant. This medication can cause serious birth defects if taken during pregnancy or if you get pregnant within 2 months after stopping therapy. A negative pregnancy test is required before starting this medication. A reliable form of contraception is recommended while taking this medication and for 2 months after stopping it. Talk to your care team about reliable forms of contraception. Do not breast-feed while taking this medication and for 1 week after stopping therapy. Use a condom during sex and for 4 months after stopping therapy. Tell your care team right away if you think your  partner might be pregnant. This medication can cause serious birth defects. This medication may cause infertility. Talk to your care team if you are concerned about your fertility. What side effects may I notice from receiving this medication? Side effects that you should report to your care team as soon as possible: Allergic reactions--skin rash, itching, hives, swelling of the face, lips, tongue, or throat Change in vision such as blurry vision, seeing halos around lights, vision loss Infection--fever, chills, cough, or sore throat Infusion reactions--chest pain, shortness of breath or trouble breathing, feeling faint or lightheaded Low red blood cell level--unusual weakness or fatigue, dizziness, headache, trouble breathing Pain, tingling, or numbness in the hands or feet Painful swelling, warmth, or redness of the skin, blisters or sores at the infusion site Redness, blistering, peeling, or loosening of the skin, including inside the mouth Sudden or severe stomach pain, bloody diarrhea, fever, nausea, vomiting Swelling of the ankles, hands, or feet Tumor lysis syndrome (TLS)--nausea, vomiting, diarrhea, decrease in the amount of urine, dark urine, unusual weakness or fatigue, confusion, muscle pain or cramps, fast or irregular heartbeat, joint pain Unusual bruising or bleeding Side effects that usually do not require medical attention (report to your care team if they continue or are bothersome): Change in nail shape, thickness, or color Change in taste Hair loss Increased tears This list may not describe all possible side effects. Call your doctor for medical advice about side effects. You may report side effects to FDA at 1-800-FDA-1088. Where should I keep my medication? This medication is given in a hospital or clinic. It will not be stored at home. NOTE: This sheet is a summary. It may not cover all possible information. If you have questions about this medicine, talk to your doctor,  pharmacist, or health care provider.  2024 Elsevier/Gold Standard (2021-07-15 00:00:00)

## 2023-08-24 NOTE — Assessment & Plan Note (Addendum)
 Likely due to poor oral intake I recommend the patient a list of food that is rich in potassium I do not think she needs potassium replacement therapy right now

## 2023-08-24 NOTE — Assessment & Plan Note (Addendum)
 Her blood count has dropped quite a bit I will reduce the dose of treatment I will bring her back next week to get her blood count rechecked and she will receive a unit of blood if hemoglobin is less than 8

## 2023-08-24 NOTE — Telephone Encounter (Signed)
 Called and given below message. She verbalized understanding.

## 2023-08-24 NOTE — Assessment & Plan Note (Addendum)
 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

## 2023-08-25 ENCOUNTER — Ambulatory Visit (HOSPITAL_COMMUNITY)
Admission: RE | Admit: 2023-08-25 | Discharge: 2023-08-25 | Disposition: A | Discharge status: Home / Self Care | Source: Ambulatory Visit | Attending: Internal Medicine | Admitting: Internal Medicine

## 2023-08-25 ENCOUNTER — Other Ambulatory Visit: Payer: Self-pay | Admitting: Hematology and Oncology

## 2023-08-25 ENCOUNTER — Telehealth: Payer: Self-pay

## 2023-08-25 DIAGNOSIS — Z933 Colostomy status: Secondary | ICD-10-CM | POA: Insufficient documentation

## 2023-08-25 DIAGNOSIS — Z433 Encounter for attention to colostomy: Secondary | ICD-10-CM

## 2023-08-25 NOTE — Discharge Instructions (Signed)
 Will set up with [facility] to provide supplies while they are in.

## 2023-08-25 NOTE — Telephone Encounter (Signed)
 Called and scheduled port/lab flush for Monday at 1315. She will come sit outside the office after lab appt until nurse comes to speak with her. Told her blood transfusion will be Tuesday 4/8 at 0830 at [facility] if blood needed. She is aware of appts.

## 2023-08-25 NOTE — Progress Notes (Signed)
 [facility]   Reason for visit:  LLQ colostomy   HPI:  Peritoneal carcinamatosis Past Medical History:  Diagnosis Date   Anxiety state, unspecified    Diverticulosis    Esophageal reflux    Family history of breast cancer    Family history of lymphoma    Family history of stomach cancer    Family history of uterine cancer    Fibroid tumor    Headache    due to allergies    Hiatal hernia    Hyperlipidemia    IBS (irritable bowel syndrome)    Internal hemorrhoids    Nonspecific elevation of levels of transaminase or lactic acid dehydrogenase (LDH)    ovarian ca dx'd 11/2018   Renal stone 07/2013   Tubulovillous adenoma of colon    Varicose veins    superficail thrombophlebitis (left LE)   Family History  Problem Relation Age of Onset   Lymphoma Father        dx 102s   Heart disease Father    Hypertension Father    Hypertension Mother    Hyperlipidemia Mother    Breast cancer Mother 44   Diabetes Maternal Grandmother    Uterine cancer Maternal Grandmother        dx 35s   Stomach cancer Paternal Grandfather    Breast cancer Other        grandmother's sisters, both dx 62s   Colon cancer Neg Hx    Allergies  Allergen Reactions   Carboplatin Other (See Comments)    Chest tightness/burning, flushing.  See Progress Note from 12/29/2022.   Skin Adhesives [Cyanoacrylate] Hives   Shrimp [Shellfish Allergy] Itching   Sulfasalazine Nausea And Vomiting   Sulfonamide Derivatives Nausea Only   Telithromycin     Other Reaction(s): severe dizziness   Current Outpatient Medications  Medication Sig Dispense Refill Last Dose/Taking   acetaminophen (TYLENOL) 500 MG tablet Take 500 mg by mouth every 6 (six) hours as needed for mild pain (pain score 1-3) or moderate pain (pain score 4-6).      ALPRAZolam (XANAX) 0.25 MG tablet Take 1 tablet (0.25 mg total) by mouth 2 (two) times daily as needed for anxiety or sleep. 60 tablet 0    APIXABAN (ELIQUIS) VTE STARTER PACK (10MG   AND 5MG ) Take as directed on package: start with two-5mg  tablets twice daily for 7 days. On day 8, switch to one-5mg  tablet twice daily. 74 each 0    Cholecalciferol (VITAMIN D3) 25 MCG (1000 UT) CAPS Take 1,000 Units by mouth daily.       dexamethasone (DECADRON) 4 MG tablet Take 2 tablets by mouth the morning before chemo and 2 tablets by mouth daily for 2 days after chemotherapy, every 3 weeks x 6 cycles. 24 tablet 6    esomeprazole (NEXIUM) 20 MG capsule Take 20 mg by mouth daily.       estradiol (ESTRACE VAGINAL) 0.1 MG/GM vaginal cream Place 1 applicatorful vaginally at bedtime as directed 42.5 g 12    famotidine (PEPCID) 20 MG tablet Take 20 mg by mouth as needed for heartburn or indigestion.      lidocaine-prilocaine (EMLA) cream Apply topically as needed. 30 g 1    magnesium oxide (MAG-OX) 400 (240 Mg) MG tablet Take 1 tablet (400 mg total) by mouth daily. 30 tablet 1    Multiple Vitamins-Minerals (AIRBORNE) CHEW Chew 1 each by mouth in the morning, at noon, and at bedtime.      Multiple Vitamins-Minerals (  MULTI FOR HER PO) Take 1 tablet by mouth daily.      ondansetron (ZOFRAN-ODT) 4 MG disintegrating tablet Take 1 tablet (4 mg total) by mouth every 8 (eight) hours as needed for nausea or vomiting. 20 tablet 0    oxyCODONE (OXY IR/ROXICODONE) 5 MG immediate release tablet Take 1 tablet (5 mg total) by mouth every 6 (six) hours as needed for severe pain (pain score 7-10) or moderate pain (pain score 4-6). 90 tablet 0    polyethylene glycol (MIRALAX / GLYCOLAX) 17 g packet Take 17 g by mouth 2 (two) times daily. 14 each 0    prochlorperazine (COMPAZINE) 10 MG tablet Take 1 tablet by mouth every 6 hours as needed (Nausea or vomiting). 30 tablet 1    No current facility-administered medications for this encounter.   ROS  Review of Systems  Constitutional:  Positive for fatigue.  Cardiovascular:  Positive for leg swelling.       (+) DVT  Gastrointestinal:  Positive for constipation.        LLQ colostomy  Psychiatric/Behavioral:  The patient is nervous/anxious.   All other systems reviewed and are negative.  Vital signs:  BP (!) 140/74 (BP Location: Right Arm)   Pulse 83   Temp 98.5 F (36.9 C) (Oral)   Resp 18   SpO2 100%  Exam:  Physical Exam Vitals reviewed.  Constitutional:      Appearance: Normal appearance.  HENT:     Mouth/Throat:     Mouth: Mucous membranes are moist.  Cardiovascular:     Rate and Rhythm: Normal rate and regular rhythm.     Pulses: Normal pulses.  Pulmonary:     Breath sounds: Normal breath sounds.  Abdominal:     Palpations: Abdomen is soft.  Musculoskeletal:        General: Normal range of motion.     Cervical back: Normal range of motion.  Skin:    General: Skin is warm and dry.     Findings: Erythema present.  Neurological:     General: No focal deficit present.     Mental Status: She is alert and oriented to person, place, and time. Mental status is at baseline.     Stoma type/location:  LLQ pink moist budded Stomal assessment/size:  productive, 5% sloughing mucosa remains Peristomal assessment:  intact Treatment options for stomal/peristomal skin: barrier ring and 2 piece convex pouch Output: soft brown stool Ostomy pouching: 2pc.convex  Education provided:  continue same pouch  will enroll with [facility] Does have DVT, is being treated with Elliquis, swelling right leg improving    Impression/dx  colostomy Discussion  Continue same pouching we started last week Plan  You are doing great !     Visit time: 45 minutes.  Mike Gip FNP-BC

## 2023-08-28 ENCOUNTER — Inpatient Hospital Stay

## 2023-08-28 ENCOUNTER — Telehealth: Payer: Self-pay | Admitting: *Deleted

## 2023-08-28 ENCOUNTER — Other Ambulatory Visit: Payer: Self-pay | Admitting: Hematology and Oncology

## 2023-08-28 ENCOUNTER — Other Ambulatory Visit: Payer: Self-pay

## 2023-08-28 ENCOUNTER — Telehealth: Payer: Self-pay

## 2023-08-28 DIAGNOSIS — C786 Secondary malignant neoplasm of retroperitoneum and peritoneum: Secondary | ICD-10-CM

## 2023-08-28 DIAGNOSIS — D61818 Other pancytopenia: Secondary | ICD-10-CM

## 2023-08-28 DIAGNOSIS — C562 Malignant neoplasm of left ovary: Secondary | ICD-10-CM

## 2023-08-28 DIAGNOSIS — Z7189 Other specified counseling: Secondary | ICD-10-CM

## 2023-08-28 DIAGNOSIS — E876 Hypokalemia: Secondary | ICD-10-CM

## 2023-08-28 DIAGNOSIS — Z5111 Encounter for antineoplastic chemotherapy: Secondary | ICD-10-CM | POA: Diagnosis not present

## 2023-08-28 LAB — CBC WITH DIFFERENTIAL/PLATELET
Abs Immature Granulocytes: 0.01 10*3/uL (ref 0.00–0.07)
Basophils Absolute: 0 10*3/uL (ref 0.0–0.1)
Basophils Relative: 0 %
Eosinophils Absolute: 0 10*3/uL (ref 0.0–0.5)
Eosinophils Relative: 0 %
HCT: 22.9 % — ABNORMAL LOW (ref 36.0–46.0)
Hemoglobin: 7.6 g/dL — ABNORMAL LOW (ref 12.0–15.0)
Immature Granulocytes: 0 %
Lymphocytes Relative: 16 %
Lymphs Abs: 0.8 10*3/uL (ref 0.7–4.0)
MCH: 29 pg (ref 26.0–34.0)
MCHC: 33.2 g/dL (ref 30.0–36.0)
MCV: 87.4 fL (ref 80.0–100.0)
Monocytes Absolute: 0 10*3/uL — ABNORMAL LOW (ref 0.1–1.0)
Monocytes Relative: 1 %
Neutro Abs: 4.4 10*3/uL (ref 1.7–7.7)
Neutrophils Relative %: 83 %
Platelets: 290 10*3/uL (ref 150–400)
RBC: 2.62 MIL/uL — ABNORMAL LOW (ref 3.87–5.11)
RDW: 15.9 % — ABNORMAL HIGH (ref 11.5–15.5)
WBC: 5.3 10*3/uL (ref 4.0–10.5)
nRBC: 0 % (ref 0.0–0.2)

## 2023-08-28 LAB — SAMPLE TO BLOOD BANK

## 2023-08-28 LAB — PREPARE RBC (CROSSMATCH)

## 2023-08-28 LAB — MAGNESIUM: Magnesium: 0.9 mg/dL — CL (ref 1.7–2.4)

## 2023-08-28 MED ORDER — SODIUM CHLORIDE 0.9% FLUSH
10.0000 mL | Freq: Once | INTRAVENOUS | Status: AC
  Administered 2023-08-28: 10 mL

## 2023-08-28 MED ORDER — HEPARIN SOD (PORK) LOCK FLUSH 100 UNIT/ML IV SOLN
250.0000 [IU] | Freq: Once | INTRAVENOUS | Status: AC
Start: 2023-08-28 — End: 2023-08-28
  Administered 2023-08-28: 250 [IU]

## 2023-08-28 NOTE — Telephone Encounter (Signed)
 Called per Dr. [name], given magnesium results. She is not currently taking magnesium and will resume BID. Told her that she will get IV magnesium with blood transfusion tomorrow. She verbalized understanding.

## 2023-08-28 NOTE — Telephone Encounter (Signed)
 CRITICAL VALUE STICKER  CRITICAL VALUE: Magnesium 0.9  MD NOTIFIED: Dr [name]  TIME OF NOTIFICATION:1438

## 2023-08-29 ENCOUNTER — Inpatient Hospital Stay

## 2023-08-29 VITALS — BP 114/64 | HR 75 | Temp 98.2°F | Resp 17

## 2023-08-29 DIAGNOSIS — Z5111 Encounter for antineoplastic chemotherapy: Secondary | ICD-10-CM | POA: Diagnosis not present

## 2023-08-29 DIAGNOSIS — Z7189 Other specified counseling: Secondary | ICD-10-CM

## 2023-08-29 DIAGNOSIS — D61818 Other pancytopenia: Secondary | ICD-10-CM

## 2023-08-29 DIAGNOSIS — C562 Malignant neoplasm of left ovary: Secondary | ICD-10-CM

## 2023-08-29 DIAGNOSIS — C786 Secondary malignant neoplasm of retroperitoneum and peritoneum: Secondary | ICD-10-CM

## 2023-08-29 MED ORDER — MAGNESIUM SULFATE 2 GM/50ML IV SOLN
2.0000 g | Freq: Once | INTRAVENOUS | Status: AC
  Administered 2023-08-29: 2 g via INTRAVENOUS
  Filled 2023-08-29: qty 50

## 2023-08-29 MED ORDER — SODIUM CHLORIDE 0.9% FLUSH: 10.0000 mL | INTRAVENOUS | Status: DC | PRN

## 2023-08-29 MED ORDER — ACETAMINOPHEN 325 MG PO TABS
650.0000 mg | ORAL_TABLET | Freq: Once | ORAL | Status: AC
Start: 2023-08-29 — End: 2023-08-29
  Administered 2023-08-29: 650 mg via ORAL
  Filled 2023-08-29: qty 2

## 2023-08-29 MED ORDER — SODIUM CHLORIDE 0.9% IV SOLUTION
250.0000 mL | INTRAVENOUS | Status: DC
Start: 2023-08-29 — End: 2023-08-29
  Administered 2023-08-29: 250 mL via INTRAVENOUS

## 2023-08-29 MED ORDER — DIPHENHYDRAMINE HCL 25 MG PO CAPS
25.0000 mg | ORAL_CAPSULE | Freq: Once | ORAL | Status: AC
  Administered 2023-08-29: 25 mg via ORAL
  Filled 2023-08-29: qty 1

## 2023-08-29 MED ORDER — HEPARIN SOD (PORK) LOCK FLUSH 100 UNIT/ML IV SOLN: 500.0000 [IU] | Freq: Every day | INTRAVENOUS | Status: DC | PRN

## 2023-08-29 NOTE — Patient Instructions (Signed)
 Blood Transfusion, Adult A blood transfusion is a procedure in which you receive blood through an IV tube. You may need this procedure because of: A bleeding disorder. An illness. An injury. A surgery. The blood may come from someone else (a donor). You may also be able to donate blood for yourself before a surgery. The blood given in a transfusion may be made up of different types of cells. You may get: Red blood cells. These carry oxygen to the cells in the body. Platelets. These help your blood to clot. Plasma. This is the liquid part of your blood. It carries proteins and other substances through the body. White blood cells. These help you fight infections. If you have a clotting disorder, you may also get other types of blood products. Depending on the type of blood product, this procedure may take 1-4 hours to complete. Tell your doctor about: Any bleeding problems you have. Any reactions you have had during a blood transfusion in the past. Any allergies you have. All medicines you are taking, including vitamins, herbs, eye drops, creams, and over-the-counter medicines. Any surgeries you have had. Any medical conditions you have. Whether you are pregnant or may be pregnant. What are the risks? Talk with your health care provider about risks. The most common problems include: A mild allergic reaction. This includes red, swollen areas of skin (hives) and itching. Fever or chills. This may be the body's response to new blood cells received. This may happen during or up to 4 hours after the transfusion. More serious problems may include: A serious allergic reaction. This includes breathing trouble or swelling around the face and lips. Too much fluid in the lungs. This may cause breathing problems. Lung injury. This causes breathing trouble and low oxygen in the blood. This can happen within hours of the transfusion or days later. Too much iron. This can happen after getting many blood  transfusions over a period of time. An infection or virus passed through the blood. This is rare. Donated blood is carefully tested before it is given. Your body's defense system (immune system) trying to attack the new blood cells. This is rare. Symptoms may include fever, chills, nausea, low blood pressure, and low back or chest pain. Donated cells attacking healthy tissues. This is rare. What happens before the procedure? You will have a blood test to find out your blood type. The test also finds out what type of blood your body will accept and matches it to the donor type. If you are going to have a planned surgery, you may be able to donate your own blood. This may be done in case you need a transfusion. You will have your temperature, blood pressure, and pulse checked. You may receive medicine to help prevent an allergic reaction. This may be done if you have had a reaction to a transfusion before. This medicine may be given to you by mouth or through an IV tube. What happens during the procedure?  An IV tube will be put into one of your veins. The bag of blood will be attached to your IV tube. Then, the blood will enter through your vein. Your temperature, blood pressure, and pulse will be checked often. This is done to find early signs of a transfusion reaction. Tell your nurse right away if you have any of these symptoms: Shortness of breath or trouble breathing. Chest or back pain. Fever or chills. Red, swollen areas of skin or itching. If you have any signs  or symptoms of a reaction, your transfusion will be stopped. You may also be given medicine. When the transfusion is finished, your IV tube will be taken out. Pressure may be put on the IV site for a few minutes. A bandage (dressing) will be put on the IV site. The procedure may vary among doctors and hospitals. What happens after the procedure? You will be monitored until you leave the hospital or clinic. This includes  checking your temperature, blood pressure, pulse, breathing rate, and blood oxygen level. Your blood may be tested to see how you have responded to the transfusion. You may be warmed with fluids or blankets. This is done to keep the temperature of your body normal. If you have your procedure in an outpatient setting, you will be told whom to contact to report any reactions. Where to find more information Visit the [employer]: redcross.org Summary A blood transfusion is a procedure in which you receive blood through an IV tube. The blood you are given may be made up of different blood cells. You may receive red blood cells, platelets, plasma, or white blood cells. Your temperature, blood pressure, and pulse will be checked often. After the procedure, your blood may be tested to see how you have responded. This information is not intended to replace advice given to you by your health care provider. Make sure you discuss any questions you have with your health care provider. Document Revised: 08/06/2021 Document Reviewed: 08/06/2021 Elsevier Patient Education  2024 Elsevier Inc.    Magnesium Sulfate Injection What is this medication? MAGNESIUM SULFATE (mag NEE zee um SUL fate) prevents and treats low levels of magnesium in your body. It may also be used to prevent and treat seizures during pregnancy in people with high blood pressure disorders, such as preeclampsia or eclampsia. Magnesium plays an important role in maintaining the health of your muscles and nervous system. This medicine may be used for other purposes; ask your health care provider or pharmacist if you have questions. What should I tell my care team before I take this medication? They need to know if you have any of these conditions: Heart disease History of irregular heart beat Kidney disease An unusual or allergic reaction to magnesium sulfate, medications, foods, dyes, or preservatives Pregnant or trying to get  pregnant Breast-feeding How should I use this medication? This medication is for infusion into a vein. It is given in a hospital or clinic setting. Talk to your care team about the use of this medication in children. While this medication may be prescribed for selected conditions, precautions do apply. Overdosage: If you think you have taken too much of this medicine contact a poison control center or emergency room at once. NOTE: This medicine is only for you. Do not share this medicine with others. What if I miss a dose? This does not apply. What may interact with this medication? Certain medications for anxiety or sleep Certain medications for seizures, such phenobarbital Digoxin Medications that relax muscles for surgery Narcotic medications for pain This list may not describe all possible interactions. Give your health care provider a list of all the medicines, herbs, non-prescription drugs, or dietary supplements you use. Also tell them if you smoke, drink alcohol, or use illegal drugs. Some items may interact with your medicine. What should I watch for while using this medication? Your condition will be monitored carefully while you are receiving this medication. You may need blood work done while you are receiving this  medication. What side effects may I notice from receiving this medication? Side effects that you should report to your care team as soon as possible: Allergic reactions--skin rash, itching, hives, swelling of the face, lips, tongue, or throat High magnesium level--confusion, drowsiness, facial flushing, redness, sweating, muscle weakness, fast or irregular heartbeat, trouble breathing Low blood pressure--dizziness, feeling faint or lightheaded, blurry vision Side effects that usually do not require medical attention (report to your care team if they continue or are bothersome): Headache Nausea This list may not describe all possible side effects. Call your doctor for  medical advice about side effects. You may report side effects to FDA at 1-800-FDA-1088. Where should I keep my medication? This medication is given in a hospital or clinic and will not be stored at home. NOTE: This sheet is a summary. It may not cover all possible information. If you have questions about this medicine, talk to your doctor, pharmacist, or health care provider.  2024 Elsevier/Gold Standard (2021-01-20 00:00:00)

## 2023-08-30 ENCOUNTER — Telehealth: Payer: Self-pay

## 2023-08-30 LAB — BPAM RBC
Blood Product Expiration Date: 202505032359
ISSUE DATE / TIME: 202504080713
Unit Type and Rh: 202505032359
Unit Type and Rh: 6200

## 2023-08-30 LAB — TYPE AND SCREEN
ABO/RH(D): A POS
Antibody Screen: NEGATIVE
Unit division: 0

## 2023-08-30 NOTE — Telephone Encounter (Signed)
 Pt called to let us know she has been doing research on eliquis discounts and the cheapest she has found is $569 monthly. She is asking if we know of any other assistance. Advised pt I am not aware of other programs but I would route her question to Steward Drone, RN to see if she is familiar with anything. She understands this is only temporary but states it is such a high cost.

## 2023-08-31 ENCOUNTER — Encounter: Payer: Self-pay | Admitting: Hematology and Oncology

## 2023-08-31 NOTE — Telephone Encounter (Signed)
 Hi Steward Drone,  Let's work on this

## 2023-08-31 NOTE — Telephone Encounter (Signed)
 Called her back and given information on [employer] for assistance with Eliquis. She will look at the requirements on the website and call the office back if she wants to apply.

## 2023-09-04 ENCOUNTER — Encounter (HOSPITAL_COMMUNITY)
Admission: RE | Admit: 2023-09-04 | Discharge: 2023-09-04 | Disposition: A | Discharge status: Home / Self Care | Source: Ambulatory Visit | Attending: Nurse Practitioner | Admitting: Nurse Practitioner

## 2023-09-04 DIAGNOSIS — Z433 Encounter for attention to colostomy: Secondary | ICD-10-CM | POA: Diagnosis present

## 2023-09-04 DIAGNOSIS — K9400 Colostomy complication, unspecified: Secondary | ICD-10-CM

## 2023-09-04 NOTE — Discharge Instructions (Signed)
 May increase to 4 inch one piece pouch [employer] ) if needed

## 2023-09-04 NOTE — Progress Notes (Signed)
 [facility]   Reason for visit:  LLQ colostomy HPI:  Peritoneal carcinamatosis Past Medical History:  Diagnosis Date   Anxiety state, unspecified    Diverticulosis    Esophageal reflux    Family history of breast cancer    Family history of lymphoma    Family history of stomach cancer    Family history of uterine cancer    Fibroid tumor    Headache    due to allergies    Hiatal hernia    Hyperlipidemia    IBS (irritable bowel syndrome)    Internal hemorrhoids    Nonspecific elevation of levels of transaminase or lactic acid dehydrogenase (LDH)    ovarian ca dx'd 11/2018   Renal stone 07/2013   Tubulovillous adenoma of colon    Varicose veins    superficail thrombophlebitis (left LE)   Family History  Problem Relation Age of Onset   Lymphoma Father        dx 13s   Heart disease Father    Hypertension Father    Hypertension Mother    Hyperlipidemia Mother    Breast cancer Mother 58   Diabetes Maternal Grandmother    Uterine cancer Maternal Grandmother        dx 107s   Stomach cancer Paternal Grandfather    Breast cancer Other        grandmother's sisters, both dx 54s   Colon cancer Neg Hx    Allergies  Allergen Reactions   Carboplatin  Other (See Comments)    Chest tightness/burning, flushing.  See Progress Note from 12/29/2022.   Skin Adhesives [Cyanoacrylate] Hives   Shrimp [Shellfish Allergy] Itching   Sulfasalazine Nausea And Vomiting   Sulfonamide Derivatives Nausea Only   Telithromycin     Other Reaction(s): severe dizziness   Current Outpatient Medications  Medication Sig Dispense Refill Last Dose/Taking   acetaminophen  (TYLENOL ) 500 MG tablet Take 500 mg by mouth every 6 (six) hours as needed for mild pain (pain score 1-3) or moderate pain (pain score 4-6).      ALPRAZolam  (XANAX ) 0.25 MG tablet Take 1 tablet (0.25 mg total) by mouth 2 (two) times daily as needed for anxiety or sleep. 60 tablet 0    apixaban  (ELIQUIS ) 5 MG TABS tablet Take 1  tablet (5 mg total) by mouth 2 (two) times daily. 60 tablet 3    Cholecalciferol (VITAMIN D3) 25 MCG (1000 UT) CAPS Take 1,000 Units by mouth daily.       dexamethasone  (DECADRON ) 4 MG tablet Take 2 tablets by mouth the morning before chemo and 2 tablets by mouth daily for 2 days after chemotherapy, every 3 weeks x 6 cycles. 24 tablet 6    esomeprazole (NEXIUM) 20 MG capsule Take 20 mg by mouth daily.       estradiol  (ESTRACE  VAGINAL) 0.1 MG/GM vaginal cream Place 1 applicatorful vaginally at bedtime as directed 42.5 g 12    famotidine  (PEPCID ) 20 MG tablet Take 20 mg by mouth as needed for heartburn or indigestion.      lidocaine -prilocaine  (EMLA ) cream Apply topically as needed. 30 g 1    magnesium  oxide (MAG-OX) 400 (240 Mg) MG tablet Take 1 tablet (400 mg total) by mouth daily. 30 tablet 1    Multiple Vitamins-Minerals (AIRBORNE) CHEW Chew 1 each by mouth in the morning, at noon, and at bedtime.      Multiple Vitamins-Minerals (MULTI FOR HER PO) Take 1 tablet by mouth daily.  ondansetron  (ZOFRAN -ODT) 4 MG disintegrating tablet Take 1 tablet (4 mg total) by mouth every 8 (eight) hours as needed for nausea or vomiting. 20 tablet 0    oxyCODONE  (OXY IR/ROXICODONE ) 5 MG immediate release tablet Take 1 tablet (5 mg total) by mouth every 6 (six) hours as needed for severe pain (pain score 7-10) or moderate pain (pain score 4-6). 90 tablet 0    polyethylene glycol (MIRALAX  / GLYCOLAX ) 17 g packet Take 17 g by mouth 2 (two) times daily. 14 each 0    prochlorperazine  (COMPAZINE ) 10 MG tablet Take 1 tablet by mouth every 6 hours as needed (Nausea or vomiting). 30 tablet 1    No current facility-administered medications for this encounter.   ROS  Review of Systems Vital signs:  BP (P) 117/67 (BP Location: Right Arm)   Pulse (P) 77   Temp (P) 97.8 F (36.6 C) (Oral)   Resp (P) 18   SpO2 (P) 100%  Exam:  Physical Exam  Stoma type/location:  LLQ pink moist budded Stomal assessment/size:   *** Peristomal assessment:  *** Treatment options for stomal/peristomal skin: *** Output: *** Ostomy pouching: 1pc./2pc.  Education provided:  ***    Impression/dx  *** Discussion  *** Plan  ***    Visit time: *** minutes.   Stacy Cain FNP-BC

## 2023-09-05 ENCOUNTER — Telehealth: Payer: Self-pay

## 2023-09-05 NOTE — Telephone Encounter (Signed)
 Pt called stating that she never received her copy of her FMLA forms that I had emailed her. I confirmed her email address and resent the documents to  her. She confirmed that she has received  the email. No questions or concerns at this time.

## 2023-09-07 ENCOUNTER — Telehealth: Payer: Self-pay

## 2023-09-07 ENCOUNTER — Other Ambulatory Visit (HOSPITAL_COMMUNITY): Payer: Self-pay

## 2023-09-07 ENCOUNTER — Other Ambulatory Visit: Payer: Self-pay

## 2023-09-07 MED ORDER — APIXABAN 5 MG PO TABS
5.0000 mg | ORAL_TABLET | Freq: Two times a day (BID) | ORAL | 3 refills | Status: DC
  Filled 2023-09-07: qty 60, 30d supply, fill #0
  Filled 2023-10-11: qty 60, 30d supply, fill #1
  Filled 2023-11-08: qty 60, 30d supply, fill #2
  Filled 2023-12-06: qty 60, 30d supply, fill #3

## 2023-09-07 NOTE — Telephone Encounter (Signed)
 Returned her call. Sent Eliquis refill to [facility]. She is asking if it is okay to wear compression stockings, told her yes. She will call the office back with questions/concerns.

## 2023-09-12 ENCOUNTER — Ambulatory Visit (HOSPITAL_COMMUNITY)
Admission: RE | Admit: 2023-09-12 | Discharge: 2023-09-12 | Disposition: A | Discharge status: Home / Self Care | Source: Ambulatory Visit | Attending: *Deleted | Admitting: *Deleted

## 2023-09-12 DIAGNOSIS — Z933 Colostomy status: Secondary | ICD-10-CM | POA: Diagnosis present

## 2023-09-12 DIAGNOSIS — Z433 Encounter for attention to colostomy: Secondary | ICD-10-CM

## 2023-09-12 NOTE — Progress Notes (Signed)
 [facility]   Reason for visit:  LLQ colostomy HPI:  Peritoneal carcinoma with end colostomy  Past Medical History:  Diagnosis Date   Anxiety state, unspecified    Diverticulosis    Esophageal reflux    Family history of breast cancer    Family history of lymphoma    Family history of stomach cancer    Family history of uterine cancer    Fibroid tumor    Headache    due to allergies    Hiatal hernia    Hyperlipidemia    IBS (irritable bowel syndrome)    Internal hemorrhoids    Nonspecific elevation of levels of transaminase or lactic acid dehydrogenase (LDH)    ovarian ca dx'd 11/2018   Renal stone 07/2013   Tubulovillous adenoma of colon    Varicose veins    superficail thrombophlebitis (left LE)   Family History  Problem Relation Age of Onset   Lymphoma Father        dx 58s   Heart disease Father    Hypertension Father    Hypertension Mother    Hyperlipidemia Mother    Breast cancer Mother 35   Diabetes Maternal Grandmother    Uterine cancer Maternal Grandmother        dx 71s   Stomach cancer Paternal Grandfather    Breast cancer Other        grandmother's sisters, both dx 47s   Colon cancer Neg Hx    Allergies  Allergen Reactions   Carboplatin  Other (See Comments)    Chest tightness/burning, flushing.  See Progress Note from 12/29/2022.   Skin Adhesives [Cyanoacrylate] Hives   Shrimp [Shellfish Allergy] Itching   Sulfasalazine Nausea And Vomiting   Sulfonamide Derivatives Nausea Only   Telithromycin     Other Reaction(s): severe dizziness   Current Outpatient Medications  Medication Sig Dispense Refill Last Dose/Taking   acetaminophen  (TYLENOL ) 500 MG tablet Take 500 mg by mouth every 6 (six) hours as needed for mild pain (pain score 1-3) or moderate pain (pain score 4-6).      ALPRAZolam  (XANAX ) 0.25 MG tablet Take 1 tablet (0.25 mg total) by mouth 2 (two) times daily as needed for anxiety or sleep. 60 tablet 0    apixaban  (ELIQUIS ) 5 MG TABS  tablet Take 1 tablet (5 mg total) by mouth 2 (two) times daily. 60 tablet 3    Cholecalciferol (VITAMIN D3) 25 MCG (1000 UT) CAPS Take 1,000 Units by mouth daily.       dexamethasone  (DECADRON ) 4 MG tablet Take 2 tablets by mouth the morning before chemo and 2 tablets by mouth daily for 2 days after chemotherapy, every 3 weeks x 6 cycles. 24 tablet 6    esomeprazole (NEXIUM) 20 MG capsule Take 20 mg by mouth daily.       estradiol  (ESTRACE  VAGINAL) 0.1 MG/GM vaginal cream Place 1 applicatorful vaginally at bedtime as directed 42.5 g 12    famotidine  (PEPCID ) 20 MG tablet Take 20 mg by mouth as needed for heartburn or indigestion.      lidocaine -prilocaine  (EMLA ) cream Apply topically as needed. 30 g 1    magnesium  oxide (MAG-OX) 400 (240 Mg) MG tablet Take 1 tablet (400 mg total) by mouth daily. 30 tablet 1    Multiple Vitamins-Minerals (AIRBORNE) CHEW Chew 1 each by mouth in the morning, at noon, and at bedtime.      Multiple Vitamins-Minerals (MULTI FOR HER PO) Take 1 tablet by mouth daily.  ondansetron  (ZOFRAN -ODT) 4 MG disintegrating tablet Take 1 tablet (4 mg total) by mouth every 8 (eight) hours as needed for nausea or vomiting. 20 tablet 0    oxyCODONE  (OXY IR/ROXICODONE ) 5 MG immediate release tablet Take 1 tablet (5 mg total) by mouth every 6 (six) hours as needed for severe pain (pain score 7-10) or moderate pain (pain score 4-6). 90 tablet 0    polyethylene glycol (MIRALAX  / GLYCOLAX ) 17 g packet Take 17 g by mouth 2 (two) times daily. 14 each 0    potassium chloride  SA (KLOR-CON  M) 20 MEQ tablet Take 1 tablet (20 mEq total) by mouth daily. 30 tablet 0    prochlorperazine  (COMPAZINE ) 10 MG tablet Take 1 tablet by mouth every 6 hours as needed (Nausea or vomiting). 30 tablet 1    No current facility-administered medications for this encounter.   ROS  Review of Systems  Constitutional:  Positive for fatigue.  Gastrointestinal:        Colostomy  Genitourinary:  Positive for  hematuria.  Musculoskeletal:  Positive for gait problem.       Swelling right leg.  Has compression in place.   Skin:  Positive for color change and rash.  Psychiatric/Behavioral:  The patient is nervous/anxious.   All other systems reviewed and are negative.  Vital signs:  BP 129/63 (BP Location: Right Arm)   Pulse 90   Temp 97.8 F (36.6 C) (Oral)   Resp 18   SpO2 95%  Exam:  Physical Exam Vitals reviewed.  Constitutional:      Appearance: Normal appearance.  HENT:     Mouth/Throat:     Mouth: Mucous membranes are moist.  Cardiovascular:     Rate and Rhythm: Normal rate and regular rhythm.  Pulmonary:     Effort: Pulmonary effort is normal.     Breath sounds: Normal breath sounds.  Abdominal:     Palpations: Abdomen is soft.  Musculoskeletal:        General: Normal range of motion.     Cervical back: Normal range of motion.  Skin:    General: Skin is warm and dry.     Findings: Erythema present.  Neurological:     Mental Status: She is alert and oriented to person, place, and time.  Psychiatric:        Mood and Affect: Mood normal.        Thought Content: Thought content normal.     Stoma type/location:  LLQ colostomy Stomal assessment/size:  pink moist oval 2 cm x 3.5 cm productive soft brown stool Peristomal assessment:  barrier is just large enough to fit stoma. Will have to size up to 4 inch if it gets any large, or trauma to stoma is noted.  Treatment options for stomal/peristomal skin: 2 piece convex pouch with barrier ring  Output: soft brown stool Ostomy pouching: 2pc.  Education provided:  We perform pouch change.  Reinforced to cut opening large enough (just beyond the perimeter ) to avoid trauma to stoma.     Impression/dx  colostomy Discussion  Cutting barrier opening large enough to avoid stoma bleeding and irritation Plan  See back 2 weeks.     Visit time: 45 minutes.   Branda Cain FNP-BC

## 2023-09-13 ENCOUNTER — Encounter: Payer: Self-pay | Admitting: Hematology and Oncology

## 2023-09-14 ENCOUNTER — Inpatient Hospital Stay

## 2023-09-14 ENCOUNTER — Encounter: Payer: Self-pay | Admitting: Hematology and Oncology

## 2023-09-14 ENCOUNTER — Encounter: Payer: Self-pay | Admitting: Nurse Practitioner

## 2023-09-14 ENCOUNTER — Other Ambulatory Visit: Payer: Self-pay | Admitting: *Deleted

## 2023-09-14 ENCOUNTER — Inpatient Hospital Stay (HOSPITAL_BASED_OUTPATIENT_CLINIC_OR_DEPARTMENT_OTHER): Admitting: Nurse Practitioner

## 2023-09-14 ENCOUNTER — Other Ambulatory Visit (HOSPITAL_COMMUNITY): Payer: Self-pay

## 2023-09-14 ENCOUNTER — Inpatient Hospital Stay (HOSPITAL_BASED_OUTPATIENT_CLINIC_OR_DEPARTMENT_OTHER): Admitting: Hematology and Oncology

## 2023-09-14 VITALS — BP 138/84 | HR 77 | Temp 97.9°F | Resp 16

## 2023-09-14 VITALS — BP 134/62 | HR 92 | Temp 97.4°F | Resp 18 | Ht 62.0 in | Wt 138.4 lb

## 2023-09-14 DIAGNOSIS — C562 Malignant neoplasm of left ovary: Secondary | ICD-10-CM

## 2023-09-14 DIAGNOSIS — C786 Secondary malignant neoplasm of retroperitoneum and peritoneum: Secondary | ICD-10-CM

## 2023-09-14 DIAGNOSIS — Z515 Encounter for palliative care: Secondary | ICD-10-CM

## 2023-09-14 DIAGNOSIS — E876 Hypokalemia: Secondary | ICD-10-CM | POA: Diagnosis not present

## 2023-09-14 DIAGNOSIS — D61818 Other pancytopenia: Secondary | ICD-10-CM

## 2023-09-14 DIAGNOSIS — Z7189 Other specified counseling: Secondary | ICD-10-CM

## 2023-09-14 DIAGNOSIS — C7960 Secondary malignant neoplasm of unspecified ovary: Secondary | ICD-10-CM | POA: Diagnosis not present

## 2023-09-14 DIAGNOSIS — Z5111 Encounter for antineoplastic chemotherapy: Secondary | ICD-10-CM | POA: Diagnosis not present

## 2023-09-14 DIAGNOSIS — G893 Neoplasm related pain (acute) (chronic): Secondary | ICD-10-CM

## 2023-09-14 DIAGNOSIS — I82401 Acute embolism and thrombosis of unspecified deep veins of right lower extremity: Secondary | ICD-10-CM | POA: Diagnosis not present

## 2023-09-14 LAB — CBC WITH DIFFERENTIAL (CANCER CENTER ONLY)
Abs Immature Granulocytes: 0.03 10*3/uL (ref 0.00–0.07)
Basophils Absolute: 0 10*3/uL (ref 0.0–0.1)
Basophils Relative: 0 %
Eosinophils Absolute: 0 10*3/uL (ref 0.0–0.5)
Eosinophils Relative: 0 %
HCT: 24.7 % — ABNORMAL LOW (ref 36.0–46.0)
Hemoglobin: 8 g/dL — ABNORMAL LOW (ref 12.0–15.0)
Immature Granulocytes: 0 %
Lymphocytes Relative: 18 %
Lymphs Abs: 1.4 10*3/uL (ref 0.7–4.0)
MCH: 28.1 pg (ref 26.0–34.0)
MCHC: 32.4 g/dL (ref 30.0–36.0)
MCV: 86.7 fL (ref 80.0–100.0)
Monocytes Absolute: 0.8 10*3/uL (ref 0.1–1.0)
Monocytes Relative: 10 %
Neutro Abs: 5.5 10*3/uL (ref 1.7–7.7)
Neutrophils Relative %: 72 %
Platelet Count: 321 10*3/uL (ref 150–400)
RBC: 2.85 MIL/uL — ABNORMAL LOW (ref 3.87–5.11)
RDW: 16 % — ABNORMAL HIGH (ref 11.5–15.5)
WBC Count: 7.7 10*3/uL (ref 4.0–10.5)
nRBC: 0 % (ref 0.0–0.2)

## 2023-09-14 LAB — CMP (CANCER CENTER ONLY)
ALT: 8 U/L (ref 0–44)
AST: 15 U/L (ref 15–41)
Albumin: 3.5 g/dL (ref 3.5–5.0)
Alkaline Phosphatase: 66 U/L (ref 38–126)
Anion gap: 5 (ref 5–15)
BUN: 13 mg/dL (ref 8–23)
CO2: 30 mmol/L (ref 22–32)
Calcium: 8.9 mg/dL (ref 8.9–10.3)
Chloride: 109 mmol/L (ref 98–111)
Creatinine: 0.41 mg/dL — ABNORMAL LOW (ref 0.44–1.00)
GFR, Estimated: >60 mL/min (ref >60)
Glucose, Bld: 92 mg/dL (ref 70–99)
Potassium: 2.9 mmol/L — ABNORMAL LOW (ref 3.5–5.1)
Sodium: 144 mmol/L (ref 135–145)
Total Bilirubin: 0.8 mg/dL (ref 0.0–1.2)
Total Protein: 6.1 g/dL — ABNORMAL LOW (ref 6.5–8.1)

## 2023-09-14 LAB — MAGNESIUM: Magnesium: 1.4 mg/dL — ABNORMAL LOW (ref 1.7–2.4)

## 2023-09-14 LAB — PREPARE RBC (CROSSMATCH)

## 2023-09-14 MED ORDER — SODIUM CHLORIDE 0.9% IV SOLUTION
250.0000 mL | INTRAVENOUS | Status: DC
Start: 2023-09-14 — End: 2023-09-14

## 2023-09-14 MED ORDER — HEPARIN SOD (PORK) LOCK FLUSH 100 UNIT/ML IV SOLN
500.0000 [IU] | Freq: Once | INTRAVENOUS | Status: AC | PRN
Start: 2023-09-14 — End: 2023-09-14
  Administered 2023-09-14: 500 [IU]

## 2023-09-14 MED ORDER — DEXAMETHASONE SODIUM PHOSPHATE 10 MG/ML IJ SOLN
10.0000 mg | Freq: Once | INTRAMUSCULAR | Status: AC
  Administered 2023-09-14: 10 mg via INTRAVENOUS
  Filled 2023-09-14: qty 1

## 2023-09-14 MED ORDER — ACETAMINOPHEN 325 MG PO TABS
650.0000 mg | ORAL_TABLET | Freq: Once | ORAL | Status: AC
  Administered 2023-09-14: 650 mg via ORAL
  Filled 2023-09-14: qty 2

## 2023-09-14 MED ORDER — SODIUM CHLORIDE 0.9% FLUSH
10.0000 mL | INTRAVENOUS | Status: DC | PRN
  Administered 2023-09-14: 10 mL

## 2023-09-14 MED ORDER — SODIUM CHLORIDE 0.9 % IV SOLN
60.0000 mg/m2 | Freq: Once | INTRAVENOUS | Status: AC
  Administered 2023-09-14: 98 mg via INTRAVENOUS
  Filled 2023-09-14: qty 9.8

## 2023-09-14 MED ORDER — SODIUM CHLORIDE 0.9 % IV SOLN: INTRAVENOUS | Status: DC

## 2023-09-14 MED ORDER — POTASSIUM CHLORIDE CRYS ER 20 MEQ PO TBCR
20.0000 meq | EXTENDED_RELEASE_TABLET | Freq: Every day | ORAL | 0 refills | Status: AC
Start: 2023-09-14 — End: ?
  Filled 2023-09-14: qty 30, 30d supply, fill #0

## 2023-09-14 MED ORDER — SODIUM CHLORIDE 0.9% FLUSH
10.0000 mL | Freq: Once | INTRAVENOUS | Status: AC
  Administered 2023-09-14: 10 mL

## 2023-09-14 NOTE — Progress Notes (Signed)
 [facility] OFFICE PROGRESS NOTE  Patient Care Team: Suan Elm, MD as PCP - General (Internal Medicine) Winda Hastings, RN as Registered Nurse Caralyn Chandler, MD as Consulting Physician (General Surgery) Almeda Jacobs, MD as Consulting Physician (Hematology and Oncology) Alphonso Aschoff, MD (Gynecologic Oncology) Nannette Babe, MD as Consulting Physician (Gastroenterology) Jeb Miner, NP as Nurse Practitioner (Hospice and Palliative Medicine)  Assessment & Plan Left ovarian epithelial cancer [facility]) 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 Between 2021-2025, she had multiple disease relapse, progressed on niraparib , paclitaxel , Doxil  and bevacizumab , Allergic to carboplatin   Pathology: High grade serous, Her 2 neu 1+ MSI stable, serous, ER 40%, Negative genetics, Folate receptor alpha neg, PD-L1 CPS 1% 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 The patient is symptomatic from anemia and the hemoglobin level is critically low.  Some of the side-effects to be expected including risks of transfusion reactions, chills, infection, syndrome of volume overload and risk of hospitalization from various reasons and the patient is willing to proceed and went ahead to sign consent today. Will proceed with chemotherapy with additional blood  transfusion support Hypokalemia  Acute deep vein thrombosis (DVT) of right lower extremity, unspecified vein (HCC) She has no signs or symptoms of bleeding She will continue anticoagulation therapy Hypomagnesemia Due to treatment I recommend the patient to continue on oral magnesium  supplement Her hypokalemia is also related to low magnesium  and I will order oral potassium replacement  Orders Placed This Encounter  Procedures   CT ABDOMEN PELVIS W CONTRAST    Standing Status:   Future    Expected Date:   09/28/2023    Expiration Date:   09/13/2024    Scheduling Instructions:     No need oral contrast    If indicated for the ordered procedure, I authorize the administration of contrast media per Radiology protocol:   Yes    Does the patient have a contrast media/X-ray dye allergy?:   No    Preferred imaging location?:   [facility]    If indicated for the ordered procedure, I authorize the administration of oral contrast media per Radiology protocol:   No    Reason for no oral contrast::   No need oral contrast   Magnesium     Standing Status:   Standing    Number of Occurrences:   2    Expiration Date:   09/13/2024   Informed Consent Details: Physician/Practitioner Attestation; Transcribe to consent form and obtain patient signature    Standing Status:   Future    Expected Date:   09/14/2023    Expiration Date:   09/13/2024    Physician/Practitioner attestation of informed consent for blood and or blood product transfusion:   I, the physician/practitioner, attest that I have discussed with the patient the benefits, risks, side effects, alternatives, likelihood of achieving goals and potential problems during recovery for the procedure that I have provided informed consent.    Product(s):  All Product(s)   Care order/instruction    Transfuse Parameters    Standing Status:   Future    Expiration Date:   09/13/2024   Type and screen         Standing Status:   Future    Number  of Occurrences:   1    Expected Date:   09/14/2023    Expiration Date:   09/13/2024   Prepare RBC (crossmatch)    Standing Status:   Standing    Number of Occurrences:   1    # of Units:   1 unit    Transfusion Indications:   Hemoglobin 8 gm/dL or less and orthopedic or cardiac surgery or pre-existing cardiac condition    Number of Units to Keep Ahead:   NO units ahead    Instructions::   Transfuse    If emergent release call blood bank:   Not emergent release     Almeda Jacobs, MD  INTERVAL HISTORY: she returns for treatment follow-up Complications related to previous cycle of chemotherapy included anemia,, leg edema, and hypomagnesemia and hypokalemia 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  PHYSICAL EXAMINATION: ECOG PERFORMANCE STATUS: 1 - Symptomatic but completely ambulatory  Vitals:   09/14/23 0933  BP: 134/62  Pulse: 92  Resp: 18  Temp: (!) 97.4 F (36.3 C)  SpO2: 100%   Filed Weights   09/14/23 0933  Weight: 138 lb 6.4 oz (62.8 kg)    Relevant data reviewed during this visit included CBC, CMP and magnesium 

## 2023-09-14 NOTE — Assessment & Plan Note (Addendum)
 Due to treatment I recommend the patient to continue on oral magnesium  supplement Her hypokalemia is also related to low magnesium  and I will order oral potassium replacement

## 2023-09-14 NOTE — Assessment & Plan Note (Addendum)
 She has no signs or symptoms of bleeding She will continue anticoagulation therapy

## 2023-09-14 NOTE — Assessment & Plan Note (Addendum)
 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 Between 2021-2025, she had multiple disease relapse, progressed on niraparib , paclitaxel , Doxil  and bevacizumab , Allergic to carboplatin   Pathology: High grade serous, Her 2 neu 1+ MSI stable, serous, ER 40%, Negative genetics, Folate receptor alpha neg, PD-L1 CPS 1% 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 after presentation with bowel obstruction She is currently on single agent docetaxel , treatment is complicated by severe anemia and recent diagnosis of blood clot We will proceed with treatment with blood transfusion support Next imaging in May Continue supportive care

## 2023-09-14 NOTE — Assessment & Plan Note (Addendum)
 We discussed some of the risks, benefits, and alternatives of blood transfusions. The patient is symptomatic from anemia and the hemoglobin level is critically low.  Some of the side-effects to be expected including risks of transfusion reactions, chills, infection, syndrome of volume overload and risk of hospitalization from various reasons and the patient is willing to proceed and went ahead to sign consent today. Will proceed with chemotherapy with additional blood transfusion support

## 2023-09-14 NOTE — Progress Notes (Signed)
 Palliative Medicine [facility]  Telephone:(336) [phone] Fax:(336) [phone]   Name: Stacy Rivas Date: 09/14/2023 MRN: [id number]  DOB: Aug 23, 1955  Patient Care Team: Suan Elm, MD as PCP - General (Internal Medicine) Winda Hastings, RN as Registered Nurse Caralyn Chandler, MD as Consulting Physician (General Surgery) Almeda Jacobs, MD as Consulting Physician (Hematology and Oncology) Alphonso Aschoff, MD (Gynecologic Oncology) Pyrtle, Amber Bail, MD as Consulting Physician (Gastroenterology) Pickenpack-Cousar, Giles Labrum, NP as Nurse Practitioner [facility] and Palliative Medicine)    INTERVAL HISTORY: Stacy Rivas is a 68 y.o. 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  Palliative is seeing patient for symptom management and goals of care.   SOCIAL HISTORY:     reports that she quit smoking about 43 years ago. Her smoking use included cigarettes. She started smoking about 53 years ago. She has never used smokeless tobacco. She reports current alcohol  use. She reports that she does not use drugs.  ADVANCE DIRECTIVES:  None on file   CODE STATUS: Full code  PAST MEDICAL HISTORY: Past Medical History:  Diagnosis Date   Anxiety state, unspecified    Diverticulosis    Esophageal reflux    Family history of breast cancer    Family history of lymphoma    Family history of stomach cancer    Family history of uterine cancer    Fibroid tumor    Headache    due to allergies    Hiatal hernia    Hyperlipidemia    IBS (irritable bowel syndrome)    Internal hemorrhoids    Nonspecific elevation of levels of transaminase or lactic acid dehydrogenase (LDH)    ovarian ca dx'd 11/2018   Renal stone 07/2013   Tubulovillous adenoma of colon    Varicose veins    superficail thrombophlebitis (left LE)    ALLERGIES:  is allergic to carboplatin , skin adhesives [cyanoacrylate], shrimp  [shellfish allergy], sulfasalazine, sulfonamide derivatives, and telithromycin.  MEDICATIONS:  Current Outpatient Medications  Medication Sig Dispense Refill   acetaminophen  (TYLENOL ) 500 MG tablet Take 500 mg by mouth every 6 (six) hours as needed for mild pain (pain score 1-3) or moderate pain (pain score 4-6).     ALPRAZolam  (XANAX ) 0.25 MG tablet Take 1 tablet (0.25 mg total) by mouth 2 (two) times daily as needed for anxiety or sleep. 60 tablet 0   apixaban  (ELIQUIS ) 5 MG TABS tablet Take 1 tablet (5 mg total) by mouth 2 (two) times daily. 60 tablet 3   Cholecalciferol (VITAMIN D3) 25 MCG (1000 UT) CAPS Take 1,000 Units by mouth daily.      dexamethasone  (DECADRON ) 4 MG tablet Take 2 tablets by mouth the morning before chemo and 2 tablets by mouth daily for 2 days after chemotherapy, every 3 weeks x 6 cycles. 24 tablet 6   esomeprazole (NEXIUM) 20 MG capsule Take 20 mg by mouth daily.      estradiol  (ESTRACE  VAGINAL) 0.1 MG/GM vaginal cream Place 1 applicatorful vaginally at bedtime as directed 42.5 g 12   famotidine  (PEPCID ) 20 MG tablet Take 20 mg by mouth as needed for heartburn or indigestion.     lidocaine -prilocaine  (EMLA ) cream Apply topically as needed. 30 g 1   magnesium  oxide (MAG-OX) 400 (240 Mg) MG tablet Take 1 tablet (400 mg total) by mouth daily. 30 tablet 1   Multiple Vitamins-Minerals (AIRBORNE) CHEW Chew 1 each by mouth in the morning,  at noon, and at bedtime.     Multiple Vitamins-Minerals (MULTI FOR HER PO) Take 1 tablet by mouth daily.     ondansetron  (ZOFRAN -ODT) 4 MG disintegrating tablet Take 1 tablet (4 mg total) by mouth every 8 (eight) hours as needed for nausea or vomiting. 20 tablet 0   oxyCODONE  (OXY IR/ROXICODONE ) 5 MG immediate release tablet Take 1 tablet (5 mg total) by mouth every 6 (six) hours as needed for severe pain (pain score 7-10) or moderate pain (pain score 4-6). 90 tablet 0   polyethylene glycol (MIRALAX  / GLYCOLAX ) 17 g packet Take 17 g by mouth 2  (two) times daily. 14 each 0   potassium chloride  SA (KLOR-CON  M) 20 MEQ tablet Take 1 tablet (20 mEq total) by mouth daily. 30 tablet 0   prochlorperazine  (COMPAZINE ) 10 MG tablet Take 1 tablet by mouth every 6 hours as needed (Nausea or vomiting). 30 tablet 1   No current facility-administered medications for this visit.   Facility-Administered Medications Ordered in Other Visits  Medication Dose Route Frequency Provider Last Rate Last Admin   0.9 %  sodium chloride  infusion   Intravenous Continuous Marton Sleeper, Ni, MD 10 mL/hr at 09/14/23 1008 New Bag at 09/14/23 1008   DOCEtaxel  (TAXOTERE ) 98 mg in sodium chloride  0.9 % 250 mL chemo infusion  60 mg/m2 (Treatment Plan Recorded) Intravenous Once Gorsuch, Ni, MD 260 mL/hr at 09/14/23 1054 98 mg at 09/14/23 1054   heparin  lock flush 100 unit/mL  500 Units Intracatheter Once PRN Marton Sleeper, Ni, MD       sodium chloride  flush (NS) 0.9 % injection 10 mL  10 mL Intracatheter PRN Marton Sleeper, Ni, MD        VITAL SIGNS: There were no vitals taken for this visit. There were no vitals filed for this visit.  Estimated body mass index is 25.31 kg/m as calculated from the following:   Height as of an earlier encounter on 09/14/23: 5\' 2"  (1.575 m).   Weight as of an earlier encounter on 09/14/23: 138 lb 6.4 oz (62.8 kg).  PERFORMANCE STATUS (ECOG) : 1 - Symptomatic but completely ambulatory  Physical Exam General: NAD Cardiovascular: regular rate and rhythm Pulmonary: normal breathing pattern Extremities: bilateral lower extremity edema R>L, right leg compression stocking in place, no joint deformities Skin: no rashes Neurological: AAO x3  IMPRESSION: Discussed the use of AI scribe software for clinical note transcription with the patient, who gave verbal consent to proceed. History of Present Illness Stacy Rivas "Stacy Rivas" is a 68 year old female who was seen in infusion for follow-up. No acute distress. She is doing well overall. Denies concerns of  nausea, vomiting, constipation, or diarrhea. Tolerating treatment without difficulty. Appetite is fair. Some days are better than others. She has adjusted her diet by eating more fruit and chicken. Her potassium is 2.9 today. We discussed potassium and protein enriched foods in the setting of her hypokalemia and anemia. Patient understands imbalance is most likely related to her treatment. Despite a hemoglobin level of 8, she feels more energetic than expected. She is not taking any potassium supplements but is on magnesium  oxide 100 mg capsules OTC, which she finds easier to swallow. Patient is aware Dr. Marton Sleeper plans to call in potassium supplement.   She has swelling localized to her left ankle since this morning, without involving the leg. There is no associated pain, but she experiences occasional tingling sensations in the affected area. She uses thigh-high compression stockings, which she finds more  effective in managing the swelling. Patient also experiences a restless feeling in her legs, particularly at night, which she attributes to her steroid use.   Mrs. Gentz denies significant pain. Occasional discomfort at times. She has not needed oxycodone  since March and reports a reduction in back pain, allowing her to walk more.   She discusses her social activities, including family gatherings.She wants to return to work but is currently on medical leave, focusing on her recovery. Feels that she may not be able to return. Stacy Rivas becomes emotional speaking of missing her colleagues and the family centered relationship that they have built together over the years. Emotional support provided.   We discussed the patient's current illness and what it means in the larger context of their on-going co-morbidities. Natural disease trajectory and expectations were discussed. Mrs. Killam continues to remain hopeful of stability and healing speaking of her spiritual beliefs. Emotional support provided.   I  discussed the importance of continued conversation with family and their medical providers regarding overall plan of care and treatment options, ensuring decisions are within the context of the patients values and GOCs. Assessment & Plan Anemia Chemotherapy induced anemia with hemoglobin at 8 g/dL. Increased energy noted despite low hemoglobin. Dietary intake discussed to boost erythropoiesis. - Monitor hemoglobin levels per Oncology. -Blood transfusion per Dr. Marton Sleeper - Encourage dietary intake of red meats and fruits.  Hypokalemia Potassium level at 2.9 mmol/L. Discussed need for potassium supplementation and dietary intake. - Prescribe potassium supplements per Dr. Marton Sleeper. - Advise increased dietary potassium intake.  Swelling and tingling in ankle Swelling and tingling possibly related to compression stockings and steroid use. No pain reported. - Monitor swelling with compression stockings with toes out. -Oxycodone  available as needed. Has not required in several weeks.   I will plan to see patient back in 3-4 weeks. Sooner if needed.   Patient expressed understanding and was in agreement with this plan. She also understands that She can call the clinic at any time with any questions, concerns, or complaints.   Visit consisted of counseling and education dealing with the complex and emotionally intense issues of symptom management and palliative care in the setting of serious and potentially life-threatening illness.  Dellia Ferguson, AGPCNP-BC  Palliative Medicine Team/[facility]

## 2023-09-14 NOTE — Patient Instructions (Signed)
 [facility] - A DEPT OF [facility]  Discharge Instructions: Thank you for choosing [facility] to provide your oncology and hematology care.   If you have a lab appointment with the Cancer Center, please go directly to the Cancer Center and check in at the registration area.   Wear comfortable clothing and clothing appropriate for easy access to any Portacath or PICC line.   We strive to give you quality time with your provider. You may need to reschedule your appointment if you arrive late (15 or more minutes).  Arriving late affects you and other patients whose appointments are after yours.  Also, if you miss three or more appointments without notifying the office, you may be dismissed from the clinic at the provider's discretion.      For prescription refill requests, have your pharmacy contact our office and allow 72 hours for refills to be completed.    Today you received the following chemotherapy and/or immunotherapy agents Taxotere.      To help prevent nausea and vomiting after your treatment, we encourage you to take your nausea medication as directed.  BELOW ARE SYMPTOMS THAT SHOULD BE REPORTED IMMEDIATELY: *FEVER GREATER THAN 100.4 F (38 C) OR HIGHER *CHILLS OR SWEATING *NAUSEA AND VOMITING THAT IS NOT CONTROLLED WITH YOUR NAUSEA MEDICATION *UNUSUAL SHORTNESS OF BREATH *UNUSUAL BRUISING OR BLEEDING *URINARY PROBLEMS (pain or burning when urinating, or frequent urination) *BOWEL PROBLEMS (unusual diarrhea, constipation, pain near the anus) TENDERNESS IN MOUTH AND THROAT WITH OR WITHOUT PRESENCE OF ULCERS (sore throat, sores in mouth, or a toothache) UNUSUAL RASH, SWELLING OR PAIN  UNUSUAL VAGINAL DISCHARGE OR ITCHING   Items with * indicate a potential emergency and should be followed up as soon as possible or go to the Emergency Department if any problems should occur.  Please show the CHEMOTHERAPY ALERT CARD or IMMUNOTHERAPY  ALERT CARD at check-in to the Emergency Department and triage nurse.  Should you have questions after your visit or need to cancel or reschedule your appointment, please contact [facility] - A DEPT OF [facility]  Dept: [phone]  and follow the prompts.  Office hours are 8:00 a.m. to 4:30 p.m. Monday - Friday. Please note that voicemails left after 4:00 p.m. may not be returned until the following business day.  We are closed weekends and major holidays. You have access to a nurse at all times for urgent questions. Please call the main number to the clinic Dept: [phone] and follow the prompts.   For any non-urgent questions, you may also contact your provider using MyChart. We now offer e-Visits for anyone 69 and older to request care online for non-urgent symptoms. For details visit [email or web address].   Also download the MyChart app! Go to the app store, search "MyChart", open the app, select [facility], and log in with your MyChart username and password.

## 2023-09-15 LAB — TYPE AND SCREEN
ABO/RH(D): A POS
Antibody Screen: NEGATIVE
Unit division: 0

## 2023-09-15 LAB — BPAM RBC
Blood Product Expiration Date: 202505252359
ISSUE DATE / TIME: 202504241154
Unit Type and Rh: 6200

## 2023-09-19 ENCOUNTER — Ambulatory Visit (HOSPITAL_COMMUNITY)
Admission: RE | Admit: 2023-09-19 | Discharge: 2023-09-19 | Disposition: A | Discharge status: Home / Self Care | Source: Ambulatory Visit | Attending: Nurse Practitioner | Admitting: Nurse Practitioner

## 2023-09-19 DIAGNOSIS — Z433 Encounter for attention to colostomy: Secondary | ICD-10-CM

## 2023-09-19 DIAGNOSIS — K9409 Other complications of colostomy: Secondary | ICD-10-CM | POA: Diagnosis not present

## 2023-09-19 DIAGNOSIS — Z86718 Personal history of other venous thrombosis and embolism: Secondary | ICD-10-CM | POA: Diagnosis not present

## 2023-09-19 DIAGNOSIS — L309 Dermatitis, unspecified: Secondary | ICD-10-CM | POA: Insufficient documentation

## 2023-09-19 DIAGNOSIS — C482 Malignant neoplasm of peritoneum, unspecified: Secondary | ICD-10-CM | POA: Diagnosis not present

## 2023-09-19 DIAGNOSIS — Z7901 Long term (current) use of anticoagulants: Secondary | ICD-10-CM | POA: Diagnosis not present

## 2023-09-19 NOTE — Progress Notes (Signed)
 [facility]   Reason for visit:  LLQ colostomy HPI:  Peritoneal carcinoma with end colostomy Past Medical History:  Diagnosis Date   Anxiety state, unspecified    Diverticulosis    Esophageal reflux    Family history of breast cancer    Family history of lymphoma    Family history of stomach cancer    Family history of uterine cancer    Fibroid tumor    Headache    due to allergies    Hiatal hernia    Hyperlipidemia    IBS (irritable bowel syndrome)    Internal hemorrhoids    Nonspecific elevation of levels of transaminase or lactic acid dehydrogenase (LDH)    ovarian ca dx'd 11/2018   Renal stone 07/2013   Tubulovillous adenoma of colon    Varicose veins    superficail thrombophlebitis (left LE)   Family History  Problem Relation Age of Onset   Lymphoma Father        dx 64s   Heart disease Father    Hypertension Father    Hypertension Mother    Hyperlipidemia Mother    Breast cancer Mother 52   Diabetes Maternal Grandmother    Uterine cancer Maternal Grandmother        dx 55s   Stomach cancer Paternal Grandfather    Breast cancer Other        grandmother's sisters, both dx 19s   Colon cancer Neg Hx    Allergies  Allergen Reactions   Carboplatin  Other (See Comments)    Chest tightness/burning, flushing.  See Progress Note from 12/29/2022.   Skin Adhesives [Cyanoacrylate] Hives   Shrimp [Shellfish Allergy] Itching   Sulfasalazine Nausea And Vomiting   Sulfonamide Derivatives Nausea Only   Telithromycin     Other Reaction(s): severe dizziness   Current Outpatient Medications  Medication Sig Dispense Refill Last Dose/Taking   acetaminophen  (TYLENOL ) 500 MG tablet Take 500 mg by mouth every 6 (six) hours as needed for mild pain (pain score 1-3) or moderate pain (pain score 4-6).      ALPRAZolam  (XANAX ) 0.25 MG tablet Take 1 tablet (0.25 mg total) by mouth 2 (two) times daily as needed for anxiety or sleep. 60 tablet 0    apixaban  (ELIQUIS ) 5 MG TABS  tablet Take 1 tablet (5 mg total) by mouth 2 (two) times daily. 60 tablet 3    Cholecalciferol (VITAMIN D3) 25 MCG (1000 UT) CAPS Take 1,000 Units by mouth daily.       dexamethasone  (DECADRON ) 4 MG tablet Take 2 tablets by mouth the morning before chemo and 2 tablets by mouth daily for 2 days after chemotherapy, every 3 weeks x 6 cycles. 24 tablet 6    esomeprazole (NEXIUM) 20 MG capsule Take 20 mg by mouth daily.       estradiol  (ESTRACE  VAGINAL) 0.1 MG/GM vaginal cream Place 1 applicatorful vaginally at bedtime as directed 42.5 g 12    famotidine  (PEPCID ) 20 MG tablet Take 20 mg by mouth as needed for heartburn or indigestion.      lidocaine -prilocaine  (EMLA ) cream Apply topically as needed. 30 g 1    magnesium  oxide (MAG-OX) 400 (240 Mg) MG tablet Take 1 tablet (400 mg total) by mouth daily. 30 tablet 1    Multiple Vitamins-Minerals (AIRBORNE) CHEW Chew 1 each by mouth in the morning, at noon, and at bedtime.      Multiple Vitamins-Minerals (MULTI FOR HER PO) Take 1 tablet by mouth daily.  ondansetron  (ZOFRAN -ODT) 4 MG disintegrating tablet Take 1 tablet (4 mg total) by mouth every 8 (eight) hours as needed for nausea or vomiting. 20 tablet 0    oxyCODONE  (OXY IR/ROXICODONE ) 5 MG immediate release tablet Take 1 tablet (5 mg total) by mouth every 6 (six) hours as needed for severe pain (pain score 7-10) or moderate pain (pain score 4-6). 90 tablet 0    polyethylene glycol (MIRALAX  / GLYCOLAX ) 17 g packet Take 17 g by mouth 2 (two) times daily. 14 each 0    potassium chloride  SA (KLOR-CON  M) 20 MEQ tablet Take 1 tablet (20 mEq total) by mouth daily. 30 tablet 0    prochlorperazine  (COMPAZINE ) 10 MG tablet Take 1 tablet by mouth every 6 hours as needed (Nausea or vomiting). 30 tablet 1    No current facility-administered medications for this encounter.   ROS  Review of Systems Vital signs:  BP 122/65 (BP Location: Right Arm)   Pulse 82   Temp 98.6 F (37 C) (Oral)   Resp 18   SpO2 96%   Exam:  Physical Exam  Stoma type/location:  LLQ colostomy  Stomal assessment/size:  oval 2 cmx 3 cm  Peristomal assessment:  erythema, intact Treatment options for stomal/peristomal skin: barrier ring and convex pouch Output: soft brown stool Ostomy pouching: 2pc.  Convex with barrier ring  Education provided:  patient is doing well and understands we may have to size up to 4 inch if stoma gets larger I diameter    Impression/dx  colostomy Discussion  *** Plan  ***    Visit time: *** minutes.   Branda Cain FNP-BC

## 2023-09-19 NOTE — Discharge Instructions (Signed)
 No changes

## 2023-09-26 ENCOUNTER — Ambulatory Visit (HOSPITAL_COMMUNITY)
Admission: RE | Admit: 2023-09-26 | Discharge: 2023-09-26 | Disposition: A | Discharge status: Home / Self Care | Source: Ambulatory Visit | Attending: *Deleted | Admitting: *Deleted

## 2023-09-27 DIAGNOSIS — K4020 Bilateral inguinal hernia, without obstruction or gangrene, not specified as recurrent: Secondary | ICD-10-CM | POA: Insufficient documentation

## 2023-09-27 NOTE — Discharge Instructions (Signed)
 No changes.  Samples of 4 inch pouches given to try as needed.  Coloplast 4 inch flat pouch. With barrier ring

## 2023-09-28 ENCOUNTER — Ambulatory Visit (HOSPITAL_COMMUNITY)
Admission: RE | Admit: 2023-09-28 | Discharge: 2023-09-28 | Disposition: A | Discharge status: Home / Self Care | Source: Ambulatory Visit | Attending: Hematology and Oncology | Admitting: Hematology and Oncology

## 2023-09-28 ENCOUNTER — Encounter: Payer: Self-pay | Admitting: Hematology and Oncology

## 2023-09-28 ENCOUNTER — Inpatient Hospital Stay: Attending: Gynecologic Oncology | Admitting: Hematology and Oncology

## 2023-09-28 VITALS — BP 127/88 | HR 107 | Resp 18 | Ht 62.0 in | Wt 134.6 lb

## 2023-09-28 DIAGNOSIS — Z7189 Other specified counseling: Secondary | ICD-10-CM

## 2023-09-28 DIAGNOSIS — C562 Malignant neoplasm of left ovary: Secondary | ICD-10-CM

## 2023-09-28 DIAGNOSIS — N1330 Unspecified hydronephrosis: Secondary | ICD-10-CM | POA: Insufficient documentation

## 2023-09-28 DIAGNOSIS — Z87891 Personal history of nicotine dependence: Secondary | ICD-10-CM | POA: Diagnosis not present

## 2023-09-28 DIAGNOSIS — C786 Secondary malignant neoplasm of retroperitoneum and peritoneum: Secondary | ICD-10-CM | POA: Insufficient documentation

## 2023-09-28 DIAGNOSIS — Z66 Do not resuscitate: Secondary | ICD-10-CM | POA: Insufficient documentation

## 2023-09-28 MED ORDER — IOHEXOL 300 MG/ML  SOLN
100.0000 mL | Freq: Once | INTRAMUSCULAR | Status: AC | PRN
  Administered 2023-09-28: 100 mL via INTRAVENOUS

## 2023-09-28 MED ORDER — IOHEXOL 9 MG/ML PO SOLN
500.0000 mL | ORAL | Status: AC
  Administered 2023-09-28 (×2): 500 mL via ORAL

## 2023-09-28 MED ORDER — IOHEXOL 9 MG/ML PO SOLN
ORAL | Status: AC
  Filled 2023-09-28: qty 1000

## 2023-09-28 NOTE — Assessment & Plan Note (Addendum)
 Counseled and coordinated advanced care planning today.  Patient is aware disease is incurable. From our prior discussion, the patient has advanced directive/living will present I verify her CODE STATUS today; she desire DNR CODE STATUS  She was established with palliative care team but is not clear whether she is ready to transition to comfort measures only She will let us  know tomorrow

## 2023-09-28 NOTE — Progress Notes (Signed)
 [facility] OFFICE PROGRESS NOTE  Patient Care Team: Suan Elm, MD as PCP - General (Internal Medicine) Winda Hastings, RN as Registered Nurse Caralyn Chandler, MD as Consulting Physician (General Surgery) Almeda Jacobs, MD as Consulting Physician (Hematology and Oncology) Alphonso Aschoff, MD (Gynecologic Oncology) Nannette Babe, MD as Consulting Physician (Gastroenterology) Jeb Miner, NP as Nurse Practitioner (Hospice and Palliative Medicine)  Assessment & Plan Left ovarian epithelial cancer [facility]) 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 Between 2021-2025, she had multiple disease relapse, progressed on niraparib , paclitaxel , Doxil  and bevacizumab , Allergic to carboplatin  and progressed on docetaxel   Pathology: High grade serous, Her 2 neu 1+ MSI stable, serous, ER 40%, Negative genetics, Folate receptor alpha neg, PD-L1 CPS 1% 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 after presentation with bowel obstruction She was recently treated with single agent docetaxel , treatment is complicated by severe anemia requiring blood transfusion support and recent diagnosis of blood clot  I reviewed CT imaging that was done today Unfortunately, she has significant disease progression with signs of bilateral hydronephrosis due to compression from her pelvic mass She has noted reduced urine output We discussed risk and benefits of percutaneous nephrostomy tube placement versus transitioning her care to comfort measures I told her there are no other good systemic treatment options available Currently, she is leaning towards comfort measures but she will return home to discuss the imaging findings with her husband and her  sister first We will call her tomorrow for final decision I will keep her appointment as scheduled next week for blood work and MD visit We can cancel her appointment next week if she is ready to transition to comfort measures Bilateral hydronephrosis This is due to progression of disease We discussed risk and benefits of percutaneous nephrostomy tube placement versus transitioning her care to comfort measures She would like to think about it Goals of care, counseling/discussion Counseled and coordinated advanced care planning today.  Patient is aware disease is incurable. From our prior discussion, the patient has advanced directive/living will present I verify her CODE STATUS today; she desire DNR CODE STATUS  She was established with palliative care team but is not clear whether she is ready to transition to comfort measures only She will let us  know tomorrow   No orders of the defined types were placed in this encounter.    Almeda Jacobs, MD  INTERVAL HISTORY: she returns for review test results She underwent CT imaging this morning Results of CT imaging unfortunately show disease progression Since last time I saw her, leg swelling has improved She noticed reduced urine output Her abdominal pain is stable on current prescribed pain medicine Her colostomy is functioning We reviewed multiple CT imaging and discussed plan of care  PHYSICAL EXAMINATION: ECOG PERFORMANCE STATUS: 1 - Symptomatic but completely ambulatory  Vitals:   09/28/23 1512  BP: 127/88  Pulse: (!) 107  Resp: 18  SpO2: 98%   Filed Weights   09/28/23 1512  Weight: 134 lb 9.6 oz (61.1 kg)    Relevant data reviewed during this visit included CT imaging from May 2025 in comparison with February imaging

## 2023-09-28 NOTE — Assessment & Plan Note (Addendum)
 This is due to progression of disease We discussed risk and benefits of percutaneous nephrostomy tube placement versus transitioning her care to comfort measures She would like to think about it

## 2023-09-28 NOTE — Assessment & Plan Note (Addendum)
 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 Between 2021-2025, she had multiple disease relapse, progressed on niraparib , paclitaxel , Doxil  and bevacizumab , Allergic to carboplatin  and progressed on docetaxel   Pathology: High grade serous, Her 2 neu 1+ MSI stable, serous, ER 40%, Negative genetics, Folate receptor alpha neg, PD-L1 CPS 1% 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 after presentation with bowel obstruction She was recently treated with single agent docetaxel , treatment is complicated by severe anemia requiring blood transfusion support and recent diagnosis of blood clot  I reviewed CT imaging that was done today Unfortunately, she has significant disease progression with signs of bilateral hydronephrosis due to compression from her pelvic mass She has noted reduced urine output We discussed risk and benefits of percutaneous nephrostomy tube placement versus transitioning her care to comfort measures I told her there are no other good systemic treatment options available Currently, she is leaning towards comfort measures but she will return home to discuss the imaging findings with her husband and her sister first We will call her tomorrow for final decision I will keep her appointment as scheduled next week for blood work and MD visit We can cancel her appointment next week if she is ready to transition to comfort measures

## 2023-09-29 ENCOUNTER — Telehealth: Payer: Self-pay

## 2023-09-29 ENCOUNTER — Other Ambulatory Visit: Payer: Self-pay | Admitting: Hematology and Oncology

## 2023-09-29 DIAGNOSIS — C562 Malignant neoplasm of left ovary: Secondary | ICD-10-CM

## 2023-09-29 NOTE — Telephone Encounter (Signed)
 Called to see if she has made a decision. She has decided to not get the nephrostomy tubes. She will keep appts next week as scheduled. She is still thinking about hospice and palliative care.

## 2023-10-03 ENCOUNTER — Ambulatory Visit (HOSPITAL_COMMUNITY)
Admission: RE | Admit: 2023-10-03 | Discharge: 2023-10-03 | Disposition: A | Discharge status: Home / Self Care | Source: Ambulatory Visit | Attending: Nurse Practitioner | Admitting: Nurse Practitioner

## 2023-10-03 ENCOUNTER — Other Ambulatory Visit (HOSPITAL_COMMUNITY): Payer: Self-pay | Admitting: Nurse Practitioner

## 2023-10-03 ENCOUNTER — Ambulatory Visit (HOSPITAL_COMMUNITY): Admitting: Nurse Practitioner

## 2023-10-03 DIAGNOSIS — K9400 Colostomy complication, unspecified: Secondary | ICD-10-CM

## 2023-10-03 DIAGNOSIS — K4020 Bilateral inguinal hernia, without obstruction or gangrene, not specified as recurrent: Secondary | ICD-10-CM

## 2023-10-03 DIAGNOSIS — L24B3 Irritant contact dermatitis related to fecal or urinary stoma or fistula: Secondary | ICD-10-CM | POA: Diagnosis present

## 2023-10-03 DIAGNOSIS — Z933 Colostomy status: Secondary | ICD-10-CM | POA: Diagnosis not present

## 2023-10-03 NOTE — Progress Notes (Signed)
 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 HPI:  Peritoneal carcinamatosis Past Medical History:  Diagnosis Date   Anxiety state, unspecified    Diverticulosis    Esophageal reflux    Family history of breast cancer    Family history of lymphoma    Family history of stomach cancer    Family history of uterine cancer    Fibroid tumor    Headache    due to allergies    Hiatal hernia    Hyperlipidemia    IBS (irritable bowel syndrome)    Internal hemorrhoids    Nonspecific elevation of levels of transaminase or lactic acid dehydrogenase (LDH)    ovarian ca dx'd 11/2018   Renal stone 07/2013   Tubulovillous adenoma of colon    Varicose veins    superficail thrombophlebitis (left LE)   Family History  Problem Relation Age of Onset   Lymphoma Father        dx 26s   Heart disease Father    Hypertension Father    Hypertension Mother    Hyperlipidemia Mother    Breast cancer Mother 52   Diabetes Maternal Grandmother    Uterine cancer Maternal Grandmother        dx 54s   Stomach cancer Paternal Grandfather    Breast cancer Other        grandmother's sisters, both dx 84s   Colon cancer Neg Hx    Allergies  Allergen Reactions   Carboplatin  Other (See Comments)    Chest tightness/burning, flushing.  See Progress Note from 12/29/2022.   Skin Adhesives [Cyanoacrylate] Hives   Shrimp [Shellfish Allergy] Itching   Sulfasalazine Nausea And Vomiting   Sulfonamide Derivatives Nausea Only   Telithromycin     Other Reaction(s): severe dizziness   Current Outpatient Medications  Medication Sig Dispense Refill Last Dose/Taking   acetaminophen  (TYLENOL ) 500 MG tablet Take 500 mg by mouth every 6 (six) hours as needed for mild pain (pain score 1-3) or moderate pain (pain score 4-6).      ALPRAZolam  (XANAX ) 0.25 MG tablet Take 1 tablet (0.25 mg total) by mouth 2 (two) times daily as needed for anxiety or sleep. 60 tablet 0     apixaban  (ELIQUIS ) 5 MG TABS tablet Take 1 tablet (5 mg total) by mouth 2 (two) times daily. 60 tablet 3    Cholecalciferol (VITAMIN D3) 25 MCG (1000 UT) CAPS Take 1,000 Units by mouth daily.       dexamethasone  (DECADRON ) 4 MG tablet Take 2 tablets by mouth the morning before chemo and 2 tablets by mouth daily for 2 days after chemotherapy, every 3 weeks x 6 cycles. 24 tablet 6    esomeprazole (NEXIUM) 20 MG capsule Take 20 mg by mouth daily.       estradiol  (ESTRACE  VAGINAL) 0.1 MG/GM vaginal cream Place 1 applicatorful vaginally at bedtime as directed 42.5 g 12    famotidine  (PEPCID ) 20 MG tablet Take 20 mg by mouth as needed for heartburn or indigestion.      lidocaine -prilocaine  (EMLA ) cream Apply topically as needed. 30 g 1    magnesium  oxide (MAG-OX) 400 (240 Mg) MG tablet Take 1 tablet (400 mg total) by mouth daily. 30 tablet 1    Multiple Vitamins-Minerals (AIRBORNE) CHEW Chew 1 each by mouth in the morning, at noon, and at bedtime.      Multiple Vitamins-Minerals (MULTI FOR HER PO) Take 1 tablet by mouth  daily.      ondansetron  (ZOFRAN -ODT) 4 MG disintegrating tablet Take 1 tablet (4 mg total) by mouth every 8 (eight) hours as needed for nausea or vomiting. 20 tablet 0    oxyCODONE  (OXY IR/ROXICODONE ) 5 MG immediate release tablet Take 1 tablet (5 mg total) by mouth every 6 (six) hours as needed for severe pain (pain score 7-10) or moderate pain (pain score 4-6). 90 tablet 0    polyethylene glycol (MIRALAX  / GLYCOLAX ) 17 g packet Take 17 g by mouth 2 (two) times daily. 14 each 0    potassium chloride  SA (KLOR-CON  M) 20 MEQ tablet Take 1 tablet (20 mEq total) by mouth daily. 30 tablet 0    prochlorperazine  (COMPAZINE ) 10 MG tablet Take 1 tablet by mouth every 6 hours as needed (Nausea or vomiting). 30 tablet 1    No current facility-administered medications for this encounter.   ROS  Review of Systems  Constitutional:  Positive for fatigue.  Respiratory: Negative.    Cardiovascular:   Positive for leg swelling.  Gastrointestinal:        LMQ colostomy with parastomal hernia   Musculoskeletal:  Positive for myalgias.  Skin:  Positive for color change and wound (peristomal breakdown).  Psychiatric/Behavioral:  Positive for dysphoric mood. The patient is nervous/anxious.   All other systems reviewed and are negative.  Vital signs:  BP (P) 121/67 (BP Location: Right Arm)   Pulse (!) (P) 111   Temp (P) 97.9 F (36.6 C) (Oral)   Resp (P) 18   SpO2 (P) 100%  Exam:  Physical Exam Vitals reviewed.  Constitutional:      Appearance: She is ill-appearing.  Pulmonary:     Effort: Pulmonary effort is normal.     Breath sounds: Normal breath sounds.  Abdominal:     Hernia: A hernia is present.  Musculoskeletal:        General: Normal range of motion.  Skin:    General: Skin is warm and dry.     Findings: Erythema and lesion present.  Neurological:     General: No focal deficit present.     Mental Status: She is alert and oriented to person, place, and time. Mental status is at baseline.  Psychiatric:        Behavior: Behavior normal.        Thought Content: Thought content normal.     Stoma type/location:  LMQ colostomy with parastomal hernia  Stomal assessment/size:  oval pink and moist 3 cm x 4 cm pink and moist Peristomal assessment:  nonintact lesion at 12 o'clock.  Apply aquacel to this area and cover with barrier ring.  Parastomal bulging from 12 to 6 o'clock.  Treatment options for stomal/peristomal skin: stoma powder and skin prep with barrier ring (aquacel to nonintact peristomal skin )  Using 4 inch coloplast flat flexible pouch.   Using barrier  strips to perimeter.  Output: soft brown stool Ostomy pouching: 1pc. Flat flexible  coloplast (4 inch pouch )  .  Education provided:  demonstrated wound care to promote healing.      Impression/dx  Colostomy Contact irritant dermatitis  Discussion  Wound care, 4 inch flat flexible pouch to promote seal and  minimize pressure to peristomal skin and promote healing.  Plan  Emotional support provided as recent oncology visits have been discouraging.  No further treatments planned.  Orders will be sent to [facility] to begin to provide supplies.     Visit time: 55 minutes.   Stacy Rivas  Stacy Kempner FNP-BC

## 2023-10-03 NOTE — Discharge Instructions (Addendum)
 Will order 4 inch flat pouch from [facility] (COloplast)   ITEM number [id number] COntinue stoma powder and skin prep when needed Add aquacel to open wound around stoma Barrier ring around stoma (and on top of aquacel) (you have a photo in your phone) Pattern in bag.  See you next week! Wear your mask when outside!

## 2023-10-05 ENCOUNTER — Ambulatory Visit

## 2023-10-05 ENCOUNTER — Inpatient Hospital Stay (HOSPITAL_BASED_OUTPATIENT_CLINIC_OR_DEPARTMENT_OTHER): Admitting: Nurse Practitioner

## 2023-10-05 ENCOUNTER — Encounter: Payer: Self-pay | Admitting: Hematology and Oncology

## 2023-10-05 ENCOUNTER — Encounter: Payer: Self-pay | Admitting: Nurse Practitioner

## 2023-10-05 ENCOUNTER — Inpatient Hospital Stay (HOSPITAL_BASED_OUTPATIENT_CLINIC_OR_DEPARTMENT_OTHER): Admitting: Hematology and Oncology

## 2023-10-05 ENCOUNTER — Inpatient Hospital Stay

## 2023-10-05 VITALS — BP 143/78 | HR 88 | Resp 18 | Ht 62.0 in | Wt 137.8 lb

## 2023-10-05 DIAGNOSIS — R530 Neoplastic (malignant) related fatigue: Secondary | ICD-10-CM | POA: Diagnosis not present

## 2023-10-05 DIAGNOSIS — C7960 Secondary malignant neoplasm of unspecified ovary: Secondary | ICD-10-CM | POA: Diagnosis not present

## 2023-10-05 DIAGNOSIS — Z7189 Other specified counseling: Secondary | ICD-10-CM

## 2023-10-05 DIAGNOSIS — C562 Malignant neoplasm of left ovary: Secondary | ICD-10-CM | POA: Diagnosis not present

## 2023-10-05 DIAGNOSIS — E876 Hypokalemia: Secondary | ICD-10-CM

## 2023-10-05 DIAGNOSIS — Z515 Encounter for palliative care: Secondary | ICD-10-CM

## 2023-10-05 DIAGNOSIS — C786 Secondary malignant neoplasm of retroperitoneum and peritoneum: Secondary | ICD-10-CM

## 2023-10-05 DIAGNOSIS — D6481 Anemia due to antineoplastic chemotherapy: Secondary | ICD-10-CM

## 2023-10-05 DIAGNOSIS — G893 Neoplasm related pain (acute) (chronic): Secondary | ICD-10-CM | POA: Diagnosis not present

## 2023-10-05 DIAGNOSIS — D61818 Other pancytopenia: Secondary | ICD-10-CM

## 2023-10-05 DIAGNOSIS — T451X5A Adverse effect of antineoplastic and immunosuppressive drugs, initial encounter: Secondary | ICD-10-CM

## 2023-10-05 LAB — COMPREHENSIVE METABOLIC PANEL WITH GFR
ALT: 11 U/L (ref 0–44)
AST: 17 U/L (ref 15–41)
Albumin: 3.6 g/dL (ref 3.5–5.0)
Alkaline Phosphatase: 63 U/L (ref 38–126)
Anion gap: 3 — ABNORMAL LOW (ref 5–15)
BUN: 14 mg/dL (ref 8–23)
CO2: 30 mmol/L (ref 22–32)
Calcium: 8.7 mg/dL — ABNORMAL LOW (ref 8.9–10.3)
Chloride: 108 mmol/L (ref 98–111)
Creatinine, Ser: 0.46 mg/dL (ref 0.44–1.00)
GFR, Estimated: >60 mL/min (ref >60)
Glucose, Bld: 114 mg/dL — ABNORMAL HIGH (ref 70–99)
Potassium: 3.4 mmol/L — ABNORMAL LOW (ref 3.5–5.1)
Sodium: 141 mmol/L (ref 135–145)
Total Bilirubin: 1 mg/dL (ref 0.0–1.2)
Total Protein: 6.2 g/dL — ABNORMAL LOW (ref 6.5–8.1)

## 2023-10-05 LAB — CBC WITH DIFFERENTIAL/PLATELET
Abs Immature Granulocytes: 0.02 10*3/uL (ref 0.00–0.07)
Basophils Absolute: 0 10*3/uL (ref 0.0–0.1)
Basophils Relative: 0 %
Eosinophils Absolute: 0.1 10*3/uL (ref 0.0–0.5)
Eosinophils Relative: 1 %
HCT: 30.4 % — ABNORMAL LOW (ref 36.0–46.0)
Hemoglobin: 9.6 g/dL — ABNORMAL LOW (ref 12.0–15.0)
Immature Granulocytes: 0 %
Lymphocytes Relative: 16 %
Lymphs Abs: 1.2 10*3/uL (ref 0.7–4.0)
MCH: 27.3 pg (ref 26.0–34.0)
MCHC: 31.6 g/dL (ref 30.0–36.0)
MCV: 86.4 fL (ref 80.0–100.0)
Monocytes Absolute: 0.6 10*3/uL (ref 0.1–1.0)
Monocytes Relative: 8 %
Neutro Abs: 5.4 10*3/uL (ref 1.7–7.7)
Neutrophils Relative %: 75 %
Platelets: 324 10*3/uL (ref 150–400)
RBC: 3.52 MIL/uL — ABNORMAL LOW (ref 3.87–5.11)
RDW: 17.9 % — ABNORMAL HIGH (ref 11.5–15.5)
WBC: 7.3 10*3/uL (ref 4.0–10.5)
nRBC: 0 % (ref 0.0–0.2)

## 2023-10-05 LAB — SAMPLE TO BLOOD BANK

## 2023-10-05 LAB — MAGNESIUM: Magnesium: 1.4 mg/dL — ABNORMAL LOW (ref 1.7–2.4)

## 2023-10-05 MED ORDER — HEPARIN SOD (PORK) LOCK FLUSH 100 UNIT/ML IV SOLN
500.0000 [IU] | Freq: Once | INTRAVENOUS | Status: AC
Start: 2023-10-05 — End: 2023-10-05
  Administered 2023-10-05: 500 [IU]

## 2023-10-05 MED ORDER — SODIUM CHLORIDE 0.9% FLUSH
10.0000 mL | Freq: Once | INTRAVENOUS | Status: AC
  Administered 2023-10-05: 10 mL

## 2023-10-05 NOTE — Progress Notes (Signed)
 [facility] OFFICE PROGRESS NOTE  Patient Care Team: Suan Elm, MD as PCP - General (Internal Medicine) Winda Hastings, RN as Registered Nurse Caralyn Chandler, MD as Consulting Physician (General Surgery) Almeda Jacobs, MD as Consulting Physician (Hematology and Oncology) Alphonso Aschoff, MD (Gynecologic Oncology) Nannette Babe, MD as Consulting Physician (Gastroenterology) Jeb Miner, NP as Nurse Practitioner (Hospice and Palliative Medicine)  Assessment & Plan Left ovarian epithelial cancer [facility]) 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 Between 2021-2025, she had multiple disease relapse, progressed on niraparib , paclitaxel , Doxil  and bevacizumab , Allergic to carboplatin  and progressed on docetaxel   Pathology: High grade serous, Her 2 neu 1+ MSI stable, serous, ER 40%, Negative genetics, Folate receptor alpha neg, PD-L1 CPS 1% 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 after presentation with bowel obstruction She was recently treated with single agent docetaxel , treatment is complicated by severe anemia requiring blood transfusion support and recent diagnosis of blood clot  CT imaging from May 2025 showed significant disease progression with signs of bilateral hydronephrosis due to compression from her pelvic mass Today, we discussed risk and benefits of percutaneous nephrostomy tube placement again and she has made informed decision not to pursue placement of percutaneous nephrostomy tubes She has appointment with palliative care team today and she will make decision whether she will be ready to transition to comfort measures Hypomagnesemia This has improved.  She will continue on oral magnesium   supplement Hypokalemia This has improved.  I told the patient to discontinue oral potassium Cancer associated pain Currently well-controlled Anemia due to antineoplastic chemotherapy Her blood count has improved since discontinuation of treatment  No orders of the defined types were placed in this encounter.    Almeda Jacobs, MD  INTERVAL HISTORY: she returns for surveillance follow-up with her sister Her sister asked questions related to the role of nephrostomy tube placement We discussed whether she needs to return follow-up and port maintenance versus enrollment in home-based palliative care with hospice  PHYSICAL EXAMINATION: ECOG PERFORMANCE STATUS: 1 - Symptomatic but completely ambulatory  Vitals:   10/05/23 1223  BP: (!) 143/78  Pulse: 88  Resp: 18  SpO2: 99%   Filed Weights   10/05/23 1223  Weight: 137 lb 12.8 oz (62.5 kg)    Relevant data reviewed during this visit included CBC, CMP and magnesium 

## 2023-10-05 NOTE — Assessment & Plan Note (Addendum)
 This has improved.  I told the patient to discontinue oral potassium

## 2023-10-05 NOTE — Assessment & Plan Note (Addendum)
 Her blood count has improved since discontinuation of treatment

## 2023-10-05 NOTE — Assessment & Plan Note (Addendum)
 This has improved.  She will continue on oral magnesium  supplement

## 2023-10-05 NOTE — Assessment & Plan Note (Addendum)
 Currently well controlled.

## 2023-10-05 NOTE — Assessment & Plan Note (Addendum)
 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 Between 2021-2025, she had multiple disease relapse, progressed on niraparib , paclitaxel , Doxil  and bevacizumab , Allergic to carboplatin  and progressed on docetaxel   Pathology: High grade serous, Her 2 neu 1+ MSI stable, serous, ER 40%, Negative genetics, Folate receptor alpha neg, PD-L1 CPS 1% 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 after presentation with bowel obstruction She was recently treated with single agent docetaxel , treatment is complicated by severe anemia requiring blood transfusion support and recent diagnosis of blood clot  CT imaging from May 2025 showed significant disease progression with signs of bilateral hydronephrosis due to compression from her pelvic mass Today, we discussed risk and benefits of percutaneous nephrostomy tube placement again and she has made informed decision not to pursue placement of percutaneous nephrostomy tubes She has appointment with palliative care team today and she will make decision whether she will be ready to transition to comfort measures

## 2023-10-05 NOTE — Progress Notes (Signed)
 Palliative Medicine [facility]  Telephone:([phone] Fax:([phone]) [phone]   Name: Stacy Rivas Date: 10/05/2023 MRN: [id number]  DOB: 10-29-1955  Patient Care Team: Suan Elm, MD as PCP - General (Internal Medicine) Winda Hastings, RN as Registered Nurse Caralyn Chandler, MD as Consulting Physician (General Surgery) Almeda Jacobs, MD as Consulting Physician (Hematology and Oncology) Alphonso Aschoff, MD (Gynecologic Oncology) Pyrtle, Amber Bail, MD as Consulting Physician (Gastroenterology) Pickenpack-Cousar, Giles Labrum, NP as Nurse Practitioner (Hospice and Palliative Medicine)    INTERVAL HISTORY: Stacy Rivas is a 68 y.o. 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 Palliative is seeing patient for symptom management and goals of care.   SOCIAL HISTORY:     reports that she quit smoking about 43 years ago. Her smoking use included cigarettes. She started smoking about 53 years ago. She has never used smokeless tobacco. She reports current alcohol  use. She reports that she does not use drugs.  ADVANCE DIRECTIVES:  On file. Patient identifies husband Hilario Lover as Oncologist with support of her sister and niece Mariah Shines and [place]). Documents reviewed.   CODE STATUS: DNR  PAST MEDICAL HISTORY: Past Medical History:  Diagnosis Date   Anxiety state, unspecified    Diverticulosis    Esophageal reflux    Family history of breast cancer    Family history of lymphoma    Family history of stomach cancer    Family history of uterine cancer    Fibroid tumor    Headache    due to allergies    Hiatal hernia    Hyperlipidemia    IBS (irritable bowel syndrome)    Internal hemorrhoids    Nonspecific elevation of levels of transaminase or lactic acid dehydrogenase (LDH)    ovarian ca dx'd 11/2018   Renal stone 07/2013   Tubulovillous adenoma of colon    Varicose veins     superficail thrombophlebitis (left LE)    ALLERGIES:  is allergic to carboplatin , skin adhesives [cyanoacrylate], shrimp [shellfish allergy], sulfasalazine, sulfonamide derivatives, and telithromycin.  MEDICATIONS:  Current Outpatient Medications  Medication Sig Dispense Refill   acetaminophen  (TYLENOL ) 500 MG tablet Take 500 mg by mouth every 6 (six) hours as needed for mild pain (pain score 1-3) or moderate pain (pain score 4-6).     ALPRAZolam  (XANAX ) 0.25 MG tablet Take 1 tablet (0.25 mg total) by mouth 2 (two) times daily as needed for anxiety or sleep. 60 tablet 0   apixaban  (ELIQUIS ) 5 MG TABS tablet Take 1 tablet (5 mg total) by mouth 2 (two) times daily. 60 tablet 3   Cholecalciferol (VITAMIN D3) 25 MCG (1000 UT) CAPS Take 1,000 Units by mouth daily.      dexamethasone  (DECADRON ) 4 MG tablet Take 2 tablets by mouth the morning before chemo and 2 tablets by mouth daily for 2 days after chemotherapy, every 3 weeks x 6 cycles. 24 tablet 6   esomeprazole (NEXIUM) 20 MG capsule Take 20 mg by mouth daily.      estradiol  (ESTRACE  VAGINAL) 0.1 MG/GM vaginal cream Place 1 applicatorful vaginally at bedtime as directed 42.5 g 12   famotidine  (PEPCID ) 20 MG tablet Take 20 mg by mouth as needed for heartburn or indigestion.     lidocaine -prilocaine  (EMLA ) cream Apply topically as needed. 30 g 1   magnesium  oxide (MAG-OX) 400 (240 Mg) MG tablet Take 1 tablet (400 mg total) by mouth daily.  30 tablet 1   Multiple Vitamins-Minerals (AIRBORNE) CHEW Chew 1 each by mouth in the morning, at noon, and at bedtime.     Multiple Vitamins-Minerals (MULTI FOR HER PO) Take 1 tablet by mouth daily.     ondansetron  (ZOFRAN -ODT) 4 MG disintegrating tablet Take 1 tablet (4 mg total) by mouth every 8 (eight) hours as needed for nausea or vomiting. 20 tablet 0   oxyCODONE  (OXY IR/ROXICODONE ) 5 MG immediate release tablet Take 1 tablet (5 mg total) by mouth every 6 (six) hours as needed for severe pain (pain score  7-10) or moderate pain (pain score 4-6). 90 tablet 0   polyethylene glycol (MIRALAX  / GLYCOLAX ) 17 g packet Take 17 g by mouth 2 (two) times daily. 14 each 0   potassium chloride  SA (KLOR-CON  M) 20 MEQ tablet Take 1 tablet (20 mEq total) by mouth daily. 30 tablet 0   prochlorperazine  (COMPAZINE ) 10 MG tablet Take 1 tablet by mouth every 6 hours as needed (Nausea or vomiting). 30 tablet 1   No current facility-administered medications for this visit.    VITAL SIGNS: There were no vitals taken for this visit. There were no vitals filed for this visit.  Estimated body mass index is 25.2 kg/m as calculated from the following:   Height as of an earlier encounter on 10/05/23: 5\' 2"  (1.575 m).   Weight as of an earlier encounter on 10/05/23: 137 lb 12.8 oz (62.5 kg).   PERFORMANCE STATUS (ECOG) : 1 - Symptomatic but completely ambulatory   Physical Exam General: NAD Cardiovascular: regular rate and rhythm Pulmonary: normal breathing pattern Extremities: no edema, no joint deformities Skin: no rashes Neurological: AAO x3  IMPRESSION: Discussed the use of AI scribe software for clinical note transcription with the patient, who gave verbal consent to proceed.  History of Present Illness Stacy Rivas "Stacy Rivas" is a 68 year old female who presents for a palliative care follow-up. No acute distress noted. She is accompanied by her sister, Mariah Shines. Denies concerns of nausea, vomiting, constipation, or diarrhea. No pain or discomfort. Occasional fatigue however remains active. Taking things one day at time.   She experiences swelling in her left ankle, which is not the leg with the blood clot. The swelling increases during the day and reduces at night. She uses a thigh-high compression sock on the right leg with the blood clot to manage symptoms. We discussed dependant edema and use of knee high hose to left leg for additional support.   Lab reviewed. Patient is aware per Dr. Marton Sleeper to  discontinue use of potassium however instructed to continue use of magnesium . She verbalized understanding.   Her appetite has improved since stopping chemotherapy, and she has been eating more chicken. She tries to remain as active as possible, walking as much as she can to maintain independence.  Goals of Care  We discussed the patient's current illness and what it means in the larger context of their on-going co-morbidities. Natural disease trajectory and expectations were discussed. Mrs. Leibovitz is realistic in her understanding of incurable cancer. She speaks to awareness of no additional treatment options with a goal of focusing on her comfort.   We discussed goals and philosophy of palliative and hospice. Education provided on ongoing symptom management and hospice referral when appropriate and patient/family feels ready.   She discusses her end-of-life care preferences, expressing a desire not to be resuscitated in a terminal state and preferring inpatient hospice care over dying at home. She wishes to  avoid a feeding tube but is open to IV fluids if dehydrated for a defined trial. Education provided on MOST and wishes have been documented and completed as desired.   The patient and family outlined their wishes for the following treatment decisions:  Cardiopulmonary Resuscitation: Do Not Attempt Resuscitation (DNR/No CPR)  Medical Interventions: Limited Additional Interventions: Use medical treatment, IV fluids and cardiac monitoring as indicated, DO NOT USE intubation or mechanical ventilation. May consider use of less invasive airway support such as BiPAP or CPAP. Also provide comfort measures. Transfer to the hospital if indicated. Avoid intensive care.  If in terminal state or no recovery wishes to focus on comfort. Patient wishes to spend last moments at inpatient hospice unit. No desire to pass away in the home if possible.   Antibiotics: Determine use of limitation of antibiotics when  infection occurs  IV Fluids: IV fluids for a defined trial period  Feeding Tube: No feeding tube    I discussed the importance of continued conversation with family and their medical providers regarding overall plan of care and treatment options, ensuring decisions are within the context of the patients values and GOCs. Assessment & Plan Cancer No active treatment. Under palliative care with focus on symptom management and quality of life.  Dependent edema of left ankle Swelling decreases at night and increases during the day, likely due to prolonged standing or sitting. - Advise use of knee-high compression socks to manage swelling. - Recommend leg elevation or standing every two to three hours if sitting for extended periods.  Hypomagnesemia Magnesium  level at 1.4 mg/dL (normal: 1.7 mg/dL). Continue magnesium  supplementation. - Continue current magnesium  supplementation as discussed with Dr. Marton Sleeper.  -Discontinue potasium.   Goals of Care Prefers no resuscitation in terminal state or natural passing. Desires hospital evaluation if critically ill but no extensive interventions if terminal. Prefers inpatient hospice at end of life. - Document DNR status and end-of-life care preferences in medical records. - Ensure family awareness of her hospitalization and hospice care wishes. - Facilitate transition to inpatient hospice care when appropriate. -Goals are clear to continue to treat the treatable. Aggressively manage symptoms as needed ensuring best quality of life for as long as she can. Patient and family realistic in their understanding of cancer status and plan of care   I will plant to follow-up with patient in 4-6 weeks. Sooner if needed.   Patient expressed understanding and was in agreement with this plan. She also understands that She can call the clinic at any time with any questions, concerns, or complaints.   Any controlled substances utilized were prescribed in the context  of palliative care. PDMP has been reviewed.   Visit consisted of counseling and education dealing with the complex and emotionally intense issues of symptom management and palliative care in the setting of serious and potentially life-threatening illness.  Dellia Ferguson, AGPCNP-BC  Palliative Medicine Team/[facility]

## 2023-10-10 ENCOUNTER — Ambulatory Visit (HOSPITAL_COMMUNITY)
Admission: RE | Admit: 2023-10-10 | Discharge: 2023-10-10 | Disposition: A | Discharge status: Home / Self Care | Source: Ambulatory Visit | Attending: Nurse Practitioner | Admitting: Nurse Practitioner

## 2023-10-10 DIAGNOSIS — Y832 Surgical operation with anastomosis, bypass or graft as the cause of abnormal reaction of the patient, or of later complication, without mention of misadventure at the time of the procedure: Secondary | ICD-10-CM | POA: Insufficient documentation

## 2023-10-10 DIAGNOSIS — K9409 Other complications of colostomy: Secondary | ICD-10-CM | POA: Diagnosis not present

## 2023-10-10 DIAGNOSIS — K9400 Colostomy complication, unspecified: Secondary | ICD-10-CM

## 2023-10-10 DIAGNOSIS — L24B3 Irritant contact dermatitis related to fecal or urinary stoma or fistula: Secondary | ICD-10-CM

## 2023-10-10 DIAGNOSIS — K435 Parastomal hernia without obstruction or  gangrene: Secondary | ICD-10-CM | POA: Insufficient documentation

## 2023-10-10 NOTE — Progress Notes (Signed)
 [facility]   Reason for visit:  LMQ with parastomal hernia and flush stoma HPI:  Peritoneal carcinamatosis Past Medical History:  Diagnosis Date   Anxiety state, unspecified    Diverticulosis    Esophageal reflux    Family history of breast cancer    Family history of lymphoma    Family history of stomach cancer    Family history of uterine cancer    Fibroid tumor    Headache    due to allergies    Hiatal hernia    Hyperlipidemia    IBS (irritable bowel syndrome)    Internal hemorrhoids    Nonspecific elevation of levels of transaminase or lactic acid dehydrogenase (LDH)    ovarian ca dx'd 11/2018   Renal stone 07/2013   Tubulovillous adenoma of colon    Varicose veins    superficail thrombophlebitis (left LE)   Family History  Problem Relation Age of Onset   Lymphoma Father        dx 40s   Heart disease Father    Hypertension Father    Hypertension Mother    Hyperlipidemia Mother    Breast cancer Mother 30   Diabetes Maternal Grandmother    Uterine cancer Maternal Grandmother        dx 75s   Stomach cancer Paternal Grandfather    Breast cancer Other        grandmother's sisters, both dx 45s   Colon cancer Neg Hx    Allergies  Allergen Reactions   Carboplatin  Other (See Comments)    Chest tightness/burning, flushing.  See Progress Note from 12/29/2022.   Skin Adhesives [Cyanoacrylate] Hives   Shrimp [Shellfish Allergy] Itching   Sulfasalazine Nausea And Vomiting   Sulfonamide Derivatives Nausea Only   Telithromycin     Other Reaction(s): severe dizziness   Current Outpatient Medications  Medication Sig Dispense Refill Last Dose/Taking   acetaminophen  (TYLENOL ) 500 MG tablet Take 500 mg by mouth every 6 (six) hours as needed for mild pain (pain score 1-3) or moderate pain (pain score 4-6).      ALPRAZolam  (XANAX ) 0.25 MG tablet Take 1 tablet (0.25 mg total) by mouth 2 (two) times daily as needed for anxiety or sleep. 60 tablet 0    apixaban   (ELIQUIS ) 5 MG TABS tablet Take 1 tablet (5 mg total) by mouth 2 (two) times daily. 60 tablet 3    Cholecalciferol (VITAMIN D3) 25 MCG (1000 UT) CAPS Take 1,000 Units by mouth daily.       dexamethasone  (DECADRON ) 4 MG tablet Take 2 tablets by mouth the morning before chemo and 2 tablets by mouth daily for 2 days after chemotherapy, every 3 weeks x 6 cycles. 24 tablet 6    esomeprazole (NEXIUM) 20 MG capsule Take 20 mg by mouth daily.       estradiol  (ESTRACE  VAGINAL) 0.1 MG/GM vaginal cream Place 1 applicatorful vaginally at bedtime as directed 42.5 g 12    famotidine  (PEPCID ) 20 MG tablet Take 20 mg by mouth as needed for heartburn or indigestion.      lidocaine -prilocaine  (EMLA ) cream Apply topically as needed. 30 g 1    magnesium  oxide (MAG-OX) 400 (240 Mg) MG tablet Take 1 tablet (400 mg total) by mouth daily. 30 tablet 1    Multiple Vitamins-Minerals (AIRBORNE) CHEW Chew 1 each by mouth in the morning, at noon, and at bedtime.      Multiple Vitamins-Minerals (MULTI FOR HER PO) Take 1 tablet by mouth daily.  ondansetron  (ZOFRAN -ODT) 4 MG disintegrating tablet Take 1 tablet (4 mg total) by mouth every 8 (eight) hours as needed for nausea or vomiting. 20 tablet 0    oxyCODONE  (OXY IR/ROXICODONE ) 5 MG immediate release tablet Take 1 tablet (5 mg total) by mouth every 6 (six) hours as needed for severe pain (pain score 7-10) or moderate pain (pain score 4-6). 90 tablet 0    polyethylene glycol (MIRALAX  / GLYCOLAX ) 17 g packet Take 17 g by mouth 2 (two) times daily. 14 each 0    potassium chloride  SA (KLOR-CON  M) 20 MEQ tablet Take 1 tablet (20 mEq total) by mouth daily. 30 tablet 0    prochlorperazine  (COMPAZINE ) 10 MG tablet Take 1 tablet by mouth every 6 hours as needed (Nausea or vomiting). 30 tablet 1    No current facility-administered medications for this encounter.   ROS  Review of Systems  Constitutional:  Positive for fatigue.  Cardiovascular:  Positive for leg swelling.   Gastrointestinal:        Colostomy Parastomal hernia   Skin:  Positive for color change, rash and wound.  Psychiatric/Behavioral:  Positive for dysphoric mood.   All other systems reviewed and are negative.  Vital signs:  BP 135/63   Pulse 89   Temp 98.3 F (36.8 C) (Oral)   Resp 18   SpO2 100%  Exam:  Physical Exam Vitals reviewed.  Constitutional:      Appearance: She is normal weight.  Cardiovascular:     Rate and Rhythm: Normal rate and regular rhythm.     Pulses: Normal pulses.     Heart sounds: Normal heart sounds.  Pulmonary:     Effort: Pulmonary effort is normal.     Breath sounds: Normal breath sounds.  Abdominal:     Palpations: Abdomen is soft.     Hernia: A hernia is present.  Musculoskeletal:        General: Normal range of motion.  Skin:    General: Skin is warm and dry.     Findings: Erythema and lesion present.  Neurological:     General: No focal deficit present.     Mental Status: She is alert and oriented to person, place, and time. Mental status is at baseline.  Psychiatric:        Behavior: Behavior normal.     Stoma type/location:  LMQ colostomy with parastomal hernia Stomal assessment/size:  oval pink and moist 3 cm x 4 cm  Peristomal assessment:  nonintact lesion at 12 o'clock.  Continue aquacel Treatment options for stomal/peristomal skin: aquacel and barrier ring 4 inch coloplast flat flexible pouch  Output: soft brown stool Ostomy pouching: 1pc. Flexible flat pouch  Education provided:  will continue wound care and pouching to promote healing.     Impression/dx  Parastomal hernia  Colostomy Peristomal breakdown Discussion  Continue wound care  Continue same pouching  Plan  Set up with [facility]     Visit time: 45 minutes.   Branda Cain FNP-BC

## 2023-10-10 NOTE — Progress Notes (Signed)
 Returned call to [facility] at [phone] and given a verbal order for ostomy supplies from Dr. Marton Sleeper. They will fax order to office for Dr. Marton Sleeper to sign.

## 2023-10-11 ENCOUNTER — Other Ambulatory Visit: Payer: Self-pay

## 2023-10-17 ENCOUNTER — Ambulatory Visit (HOSPITAL_COMMUNITY)
Admission: RE | Admit: 2023-10-17 | Discharge: 2023-10-17 | Disposition: A | Discharge status: Home / Self Care | Source: Ambulatory Visit | Attending: Nurse Practitioner | Admitting: Nurse Practitioner

## 2023-10-17 DIAGNOSIS — C786 Secondary malignant neoplasm of retroperitoneum and peritoneum: Secondary | ICD-10-CM | POA: Insufficient documentation

## 2023-10-17 DIAGNOSIS — K9400 Colostomy complication, unspecified: Secondary | ICD-10-CM

## 2023-10-17 DIAGNOSIS — K435 Parastomal hernia without obstruction or  gangrene: Secondary | ICD-10-CM | POA: Diagnosis not present

## 2023-10-17 DIAGNOSIS — L24B3 Irritant contact dermatitis related to fecal or urinary stoma or fistula: Secondary | ICD-10-CM | POA: Diagnosis not present

## 2023-10-17 DIAGNOSIS — Z433 Encounter for attention to colostomy: Secondary | ICD-10-CM | POA: Insufficient documentation

## 2023-10-17 NOTE — Progress Notes (Signed)
 [facility]   Reason for visit:  LMQ with parastomal hernia and flush stoma  HPI:  Peritoneal carcinamatosis Past Medical History:  Diagnosis Date   Anxiety state, unspecified    Diverticulosis    Esophageal reflux    Family history of breast cancer    Family history of lymphoma    Family history of stomach cancer    Family history of uterine cancer    Fibroid tumor    Headache    due to allergies    Hiatal hernia    Hyperlipidemia    IBS (irritable bowel syndrome)    Internal hemorrhoids    Nonspecific elevation of levels of transaminase or lactic acid dehydrogenase (LDH)    ovarian ca dx'd 11/2018   Renal stone 07/2013   Tubulovillous adenoma of colon    Varicose veins    superficail thrombophlebitis (left LE)   Family History  Problem Relation Age of Onset   Lymphoma Father        dx 41s   Heart disease Father    Hypertension Father    Hypertension Mother    Hyperlipidemia Mother    Breast cancer Mother 98   Diabetes Maternal Grandmother    Uterine cancer Maternal Grandmother        dx 73s   Stomach cancer Paternal Grandfather    Breast cancer Other        grandmother's sisters, both dx 32s   Colon cancer Neg Hx    Allergies  Allergen Reactions   Carboplatin  Other (See Comments)    Chest tightness/burning, flushing.  See Progress Note from 12/29/2022.   Skin Adhesives [Cyanoacrylate] Hives   Shrimp [Shellfish Allergy] Itching   Sulfasalazine Nausea And Vomiting   Sulfonamide Derivatives Nausea Only   Telithromycin     Other Reaction(s): severe dizziness   Current Outpatient Medications  Medication Sig Dispense Refill Last Dose/Taking   acetaminophen  (TYLENOL ) 500 MG tablet Take 500 mg by mouth every 6 (six) hours as needed for mild pain (pain score 1-3) or moderate pain (pain score 4-6).      ALPRAZolam  (XANAX ) 0.25 MG tablet Take 1 tablet (0.25 mg total) by mouth 2 (two) times daily as needed for anxiety or sleep. 60 tablet 0    apixaban   (ELIQUIS ) 5 MG TABS tablet Take 1 tablet (5 mg total) by mouth 2 (two) times daily. 60 tablet 3    Cholecalciferol (VITAMIN D3) 25 MCG (1000 UT) CAPS Take 1,000 Units by mouth daily.       dexamethasone  (DECADRON ) 4 MG tablet Take 2 tablets by mouth the morning before chemo and 2 tablets by mouth daily for 2 days after chemotherapy, every 3 weeks x 6 cycles. 24 tablet 6    esomeprazole (NEXIUM) 20 MG capsule Take 20 mg by mouth daily.       estradiol  (ESTRACE  VAGINAL) 0.1 MG/GM vaginal cream Place 1 applicatorful vaginally at bedtime as directed 42.5 g 12    famotidine  (PEPCID ) 20 MG tablet Take 20 mg by mouth as needed for heartburn or indigestion.      lidocaine -prilocaine  (EMLA ) cream Apply topically as needed. 30 g 1    magnesium  oxide (MAG-OX) 400 (240 Mg) MG tablet Take 1 tablet (400 mg total) by mouth daily. 30 tablet 1    Multiple Vitamins-Minerals (AIRBORNE) CHEW Chew 1 each by mouth in the morning, at noon, and at bedtime.      Multiple Vitamins-Minerals (MULTI FOR HER PO) Take 1 tablet by mouth  daily.      ondansetron  (ZOFRAN -ODT) 4 MG disintegrating tablet Take 1 tablet (4 mg total) by mouth every 8 (eight) hours as needed for nausea or vomiting. 20 tablet 0    oxyCODONE  (OXY IR/ROXICODONE ) 5 MG immediate release tablet Take 1 tablet (5 mg total) by mouth every 6 (six) hours as needed for severe pain (pain score 7-10) or moderate pain (pain score 4-6). 90 tablet 0    polyethylene glycol (MIRALAX  / GLYCOLAX ) 17 g packet Take 17 g by mouth 2 (two) times daily. 14 each 0    potassium chloride  SA (KLOR-CON  M) 20 MEQ tablet Take 1 tablet (20 mEq total) by mouth daily. 30 tablet 0    prochlorperazine  (COMPAZINE ) 10 MG tablet Take 1 tablet by mouth every 6 hours as needed (Nausea or vomiting). 30 tablet 1    No current facility-administered medications for this encounter.   ROS  Review of Systems  Constitutional:  Positive for fatigue.  Cardiovascular:  Positive for leg swelling.   Gastrointestinal:        LMQ colostomy Parastomal hernia   Skin:  Positive for rash and wound.  Psychiatric/Behavioral:  The patient is nervous/anxious.   All other systems reviewed and are negative.  Vital signs:  BP 131/76 (BP Location: Right Arm)   Pulse 85   Temp 98.2 F (36.8 C) (Oral)   Resp 18   SpO2 99%  Exam:  Physical Exam Vitals reviewed.  Constitutional:      Appearance: Normal appearance.  HENT:     Mouth/Throat:     Mouth: Mucous membranes are moist.  Cardiovascular:     Rate and Rhythm: Normal rate and regular rhythm.  Pulmonary:     Effort: Pulmonary effort is normal.     Breath sounds: Normal breath sounds.  Abdominal:     Palpations: Abdomen is soft.     Hernia: A hernia is present.  Musculoskeletal:        General: Normal range of motion.  Skin:    General: Skin is warm and dry.     Findings: Erythema, lesion and rash present.  Neurological:     General: No focal deficit present.     Mental Status: She is alert and oriented to person, place, and time. Mental status is at baseline.  Psychiatric:        Mood and Affect: Mood normal.        Behavior: Behavior normal.     Stoma type/location:  LMQ colostomy with parastomal hernia  Stomal assessment/size:  oval 3 cm x 4 cm  Peristomal assessment:  wound improving at 12 o'clock, some new epithelial tissue noted. Continue aquacel one more week to promote healing Treatment options for stomal/peristomal skin: stoma powder and skin prep  aquacel and barrier ring to peristomal skin  Output: soft brown stool Ostomy pouching: 1pc. Flat pouch 4 inch.   Education provided:  continue same process for wound care and ostomy care. [facility] has delivered supplies.     Impression/dx  Peristomal breakdown  Parastomal hernia colostomy Discussion  No changes, wound care to promote healing.  Continue 4 inch pouch  Plan  See back as needed.     Visit time: 45 minutes.   Branda Cain FNP-BC

## 2023-10-18 ENCOUNTER — Telehealth: Payer: Self-pay

## 2023-10-18 NOTE — Telephone Encounter (Signed)
Ok to observe for now

## 2023-10-18 NOTE — Telephone Encounter (Signed)
 Called and given below message from Dr. Marton Sleeper. She verbalized understanding and will call the office back for concerns.

## 2023-10-18 NOTE — Telephone Encounter (Signed)
 Returned her call. She had x1 episode of blood in her urine this afternoon. She has been doing some lifting today and moving things around. She may have over did the lifting she said. She is taking her Eliquis  and wanted Dr. Marton Sleeper to be aware. She does not complain that she had a hard time passing her urine. She is having burning with urination and this is normal for her and feels kind of irritated with urinating.  Instructed to push oral fluids. She verbalized understanding

## 2023-10-19 DIAGNOSIS — K435 Parastomal hernia without obstruction or  gangrene: Secondary | ICD-10-CM | POA: Insufficient documentation

## 2023-10-19 NOTE — Discharge Instructions (Signed)
 Continue aquacel to wound, barrier ring 4 inch pouch

## 2023-10-24 ENCOUNTER — Telehealth: Payer: Self-pay

## 2023-10-24 ENCOUNTER — Ambulatory Visit (HOSPITAL_COMMUNITY)
Admission: RE | Admit: 2023-10-24 | Discharge: 2023-10-24 | Disposition: A | Discharge status: Home / Self Care | Source: Ambulatory Visit | Attending: Internal Medicine | Admitting: Internal Medicine

## 2023-10-24 DIAGNOSIS — K9409 Other complications of colostomy: Secondary | ICD-10-CM | POA: Insufficient documentation

## 2023-10-24 DIAGNOSIS — Z933 Colostomy status: Secondary | ICD-10-CM | POA: Diagnosis present

## 2023-10-24 DIAGNOSIS — Y839 Surgical procedure, unspecified as the cause of abnormal reaction of the patient, or of later complication, without mention of misadventure at the time of the procedure: Secondary | ICD-10-CM | POA: Insufficient documentation

## 2023-10-24 DIAGNOSIS — K9400 Colostomy complication, unspecified: Secondary | ICD-10-CM

## 2023-10-24 DIAGNOSIS — K435 Parastomal hernia without obstruction or  gangrene: Secondary | ICD-10-CM | POA: Diagnosis not present

## 2023-10-24 NOTE — Telephone Encounter (Signed)
 Error

## 2023-10-24 NOTE — Telephone Encounter (Signed)
 Notified the pt regarding her disability form completed,faxed,and confirmation receive I was unable to LVM.

## 2023-10-24 NOTE — Telephone Encounter (Signed)
 FMLA form completed.

## 2023-10-24 NOTE — Telephone Encounter (Signed)
 error

## 2023-10-24 NOTE — Progress Notes (Signed)
 [facility]   Reason for visit:  LMQ colostomy with parastomal hernia HPI:  Peritoneal carcinamatosis Past Medical History:  Diagnosis Date   Anxiety state, unspecified    Diverticulosis    Esophageal reflux    Family history of breast cancer    Family history of lymphoma    Family history of stomach cancer    Family history of uterine cancer    Fibroid tumor    Headache    due to allergies    Hiatal hernia    Hyperlipidemia    IBS (irritable bowel syndrome)    Internal hemorrhoids    Nonspecific elevation of levels of transaminase or lactic acid dehydrogenase (LDH)    ovarian ca dx'd 11/2018   Renal stone 07/2013   Tubulovillous adenoma of colon    Varicose veins    superficail thrombophlebitis (left LE)   Family History  Problem Relation Age of Onset   Lymphoma Father        dx 78s   Heart disease Father    Hypertension Father    Hypertension Mother    Hyperlipidemia Mother    Breast cancer Mother 60   Diabetes Maternal Grandmother    Uterine cancer Maternal Grandmother        dx 50s   Stomach cancer Paternal Grandfather    Breast cancer Other        grandmother's sisters, both dx 53s   Colon cancer Neg Hx    Allergies  Allergen Reactions   Carboplatin  Other (See Comments)    Chest tightness/burning, flushing.  See Progress Note from 12/29/2022.   Skin Adhesives [Cyanoacrylate] Hives   Shrimp [Shellfish Allergy] Itching   Sulfasalazine Nausea And Vomiting   Sulfonamide Derivatives Nausea Only   Telithromycin     Other Reaction(s): severe dizziness   Current Outpatient Medications  Medication Sig Dispense Refill Last Dose/Taking   acetaminophen  (TYLENOL ) 500 MG tablet Take 500 mg by mouth every 6 (six) hours as needed for mild pain (pain score 1-3) or moderate pain (pain score 4-6).      ALPRAZolam  (XANAX ) 0.25 MG tablet Take 1 tablet (0.25 mg total) by mouth 2 (two) times daily as needed for anxiety or sleep. 60 tablet 0    apixaban  (ELIQUIS ) 5  MG TABS tablet Take 1 tablet (5 mg total) by mouth 2 (two) times daily. 60 tablet 3    Cholecalciferol (VITAMIN D3) 25 MCG (1000 UT) CAPS Take 1,000 Units by mouth daily.       dexamethasone  (DECADRON ) 4 MG tablet Take 2 tablets by mouth the morning before chemo and 2 tablets by mouth daily for 2 days after chemotherapy, every 3 weeks x 6 cycles. 24 tablet 6    esomeprazole (NEXIUM) 20 MG capsule Take 20 mg by mouth daily.       estradiol  (ESTRACE  VAGINAL) 0.1 MG/GM vaginal cream Place 1 applicatorful vaginally at bedtime as directed 42.5 g 12    famotidine  (PEPCID ) 20 MG tablet Take 20 mg by mouth as needed for heartburn or indigestion.      lidocaine -prilocaine  (EMLA ) cream Apply topically as needed. 30 g 1    magnesium  oxide (MAG-OX) 400 (240 Mg) MG tablet Take 1 tablet (400 mg total) by mouth daily. 30 tablet 1    Multiple Vitamins-Minerals (AIRBORNE) CHEW Chew 1 each by mouth in the morning, at noon, and at bedtime.      Multiple Vitamins-Minerals (MULTI FOR HER PO) Take 1 tablet by mouth daily.  ondansetron  (ZOFRAN -ODT) 4 MG disintegrating tablet Take 1 tablet (4 mg total) by mouth every 8 (eight) hours as needed for nausea or vomiting. 20 tablet 0    oxyCODONE  (OXY IR/ROXICODONE ) 5 MG immediate release tablet Take 1 tablet (5 mg total) by mouth every 6 (six) hours as needed for severe pain (pain score 7-10) or moderate pain (pain score 4-6). 90 tablet 0    polyethylene glycol (MIRALAX  / GLYCOLAX ) 17 g packet Take 17 g by mouth 2 (two) times daily. 14 each 0    potassium chloride  SA (KLOR-CON  M) 20 MEQ tablet Take 1 tablet (20 mEq total) by mouth daily. 30 tablet 0    prochlorperazine  (COMPAZINE ) 10 MG tablet Take 1 tablet by mouth every 6 hours as needed (Nausea or vomiting). 30 tablet 1    No current facility-administered medications for this encounter.   ROS  Review of Systems  Constitutional:  Positive for fatigue.  Cardiovascular:  Positive for leg swelling.  Gastrointestinal:         LMQ colostomy with parastomal hernia  Musculoskeletal:  Positive for myalgias.  Psychiatric/Behavioral: Negative.    All other systems reviewed and are negative.  Vital signs:  BP 121/76 (BP Location: Right Arm)   Pulse 79   Temp 97.8 F (36.6 C) (Oral)   Resp 18   SpO2 99%  Exam:  Physical Exam Vitals reviewed.  HENT:     Mouth/Throat:     Mouth: Mucous membranes are moist.  Cardiovascular:     Rate and Rhythm: Normal rate and regular rhythm.  Pulmonary:     Effort: Pulmonary effort is normal.  Abdominal:     Palpations: Abdomen is soft.     Hernia: A hernia is present.  Musculoskeletal:        General: Normal range of motion.  Skin:    General: Skin is warm and dry.     Findings: Erythema present.  Neurological:     General: No focal deficit present.     Mental Status: She is alert and oriented to person, place, and time. Mental status is at baseline.  Psychiatric:        Mood and Affect: Mood normal.        Behavior: Behavior normal.     Stoma type/location:  LMQ colostomy Stomal assessment/size:  2 3/4" flush with parastomal bulging  Peristomal assessment:  stoma powder and skin prep Treatment options for stomal/peristomal skin: 1 piece flat coloplast pouch (4 inch diameter)  Output: soft brown stool  Ostomy pouching: 1pc. Flat 4 inch pouch Education provided:  Continue same pouch  no changes    Impression/dx  Colostomy complication Parastomal hernia Discussion  See back as needed.  Plan  Monitoring hernia, production of stool and occasional constipation    Visit time: 40 minutes.   Branda Cain FNP-BC

## 2023-10-31 ENCOUNTER — Ambulatory Visit (HOSPITAL_COMMUNITY): Admitting: Nurse Practitioner

## 2023-11-02 ENCOUNTER — Inpatient Hospital Stay: Admitting: Nurse Practitioner

## 2023-11-07 ENCOUNTER — Other Ambulatory Visit: Payer: Self-pay

## 2023-11-07 ENCOUNTER — Inpatient Hospital Stay: Attending: Gynecologic Oncology | Admitting: Nurse Practitioner

## 2023-11-07 ENCOUNTER — Encounter: Payer: Self-pay | Admitting: Nurse Practitioner

## 2023-11-07 ENCOUNTER — Ambulatory Visit (HOSPITAL_COMMUNITY)
Admission: RE | Admit: 2023-11-07 | Discharge: 2023-11-07 | Disposition: A | Discharge status: Home / Self Care | Source: Ambulatory Visit | Attending: *Deleted | Admitting: *Deleted

## 2023-11-07 DIAGNOSIS — C7960 Secondary malignant neoplasm of unspecified ovary: Secondary | ICD-10-CM

## 2023-11-07 DIAGNOSIS — Z515 Encounter for palliative care: Secondary | ICD-10-CM | POA: Diagnosis not present

## 2023-11-07 DIAGNOSIS — L24B3 Irritant contact dermatitis related to fecal or urinary stoma or fistula: Secondary | ICD-10-CM

## 2023-11-07 DIAGNOSIS — C562 Malignant neoplasm of left ovary: Secondary | ICD-10-CM | POA: Insufficient documentation

## 2023-11-07 DIAGNOSIS — C786 Secondary malignant neoplasm of retroperitoneum and peritoneum: Secondary | ICD-10-CM | POA: Insufficient documentation

## 2023-11-07 DIAGNOSIS — Z933 Colostomy status: Secondary | ICD-10-CM | POA: Insufficient documentation

## 2023-11-07 DIAGNOSIS — K435 Parastomal hernia without obstruction or  gangrene: Secondary | ICD-10-CM | POA: Diagnosis not present

## 2023-11-07 DIAGNOSIS — K9400 Colostomy complication, unspecified: Secondary | ICD-10-CM

## 2023-11-07 DIAGNOSIS — D6481 Anemia due to antineoplastic chemotherapy: Secondary | ICD-10-CM

## 2023-11-07 DIAGNOSIS — R530 Neoplastic (malignant) related fatigue: Secondary | ICD-10-CM

## 2023-11-07 DIAGNOSIS — G893 Neoplasm related pain (acute) (chronic): Secondary | ICD-10-CM | POA: Diagnosis not present

## 2023-11-07 DIAGNOSIS — Z66 Do not resuscitate: Secondary | ICD-10-CM | POA: Insufficient documentation

## 2023-11-07 DIAGNOSIS — E876 Hypokalemia: Secondary | ICD-10-CM

## 2023-11-07 NOTE — Progress Notes (Signed)
 Palliative Medicine [facility]  [phone] [phone]   Name: Stacy Rivas Date: 11/07/2023 MRN: [id number]  DOB: [birth year]  Patient Care Team: Suan Elm, MD as PCP - General (Internal Medicine) Winda Hastings, RN as Registered Nurse Caralyn Chandler, MD as Consulting Physician (General Surgery) Almeda Jacobs, MD as Consulting Physician (Hematology and Oncology) Alphonso Aschoff, MD (Gynecologic Oncology) Pyrtle, Amber Bail, MD as Consulting Physician (Gastroenterology) Pickenpack-Cousar, Giles Labrum, NP as Nurse Practitioner [facility] and Palliative Medicine)   I connected with Stacy Rivas on 11/07/23 at 12:00 PM EDT by telephone and verified that I am speaking with the correct person using two identifiers.   I discussed the limitations, risks, security and privacy concerns of performing an evaluation and management service by telemedicine and the availability of in-person appointments. I also discussed with the patient that there may be a patient responsible charge related to this service. The patient expressed understanding and agreed to proceed.   Other persons participating in the visit and their role in the encounter: N/A   Patient's location: Home   Provider's location: [facility]   INTERVAL HISTORY: Stacy Rivas is a 68 y.o. 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 Palliative is seeing patient for symptom management and goals of care.   SOCIAL HISTORY:     reports that she quit smoking about 43 years ago. Her smoking use included cigarettes. She started smoking about 53 years ago. She has never used smokeless tobacco. She reports current alcohol  use. She reports that she does not use drugs.  ADVANCE DIRECTIVES:  On file. Patient identifies husband Stacy Rivas as Oncologist with support of her sister and niece Stacy Rivas and Stacy Rivas). Documents  reviewed.   CODE STATUS: DNR  PAST MEDICAL HISTORY: Past Medical History:  Diagnosis Date   Anxiety state, unspecified    Diverticulosis    Esophageal reflux    Family history of breast cancer    Family history of lymphoma    Family history of stomach cancer    Family history of uterine cancer    Fibroid tumor    Headache    due to allergies    Hiatal hernia    Hyperlipidemia    IBS (irritable bowel syndrome)    Internal hemorrhoids    Nonspecific elevation of levels of transaminase or lactic acid dehydrogenase (LDH)    ovarian ca dx'd 11/2018   Renal stone 07/2013   Tubulovillous adenoma of colon    Varicose veins    superficail thrombophlebitis (left LE)    ALLERGIES:  is allergic to carboplatin , skin adhesives [cyanoacrylate], shrimp [shellfish allergy], sulfasalazine, sulfonamide derivatives, and telithromycin.  MEDICATIONS:  Current Outpatient Medications  Medication Sig Dispense Refill   acetaminophen  (TYLENOL ) 500 MG tablet Take 500 mg by mouth every 6 (six) hours as needed for mild pain (pain score 1-3) or moderate pain (pain score 4-6).     ALPRAZolam  (XANAX ) 0.25 MG tablet Take 1 tablet (0.25 mg total) by mouth 2 (two) times daily as needed for anxiety or sleep. 60 tablet 0   apixaban  (ELIQUIS ) 5 MG TABS tablet Take 1 tablet (5 mg total) by mouth 2 (two) times daily. 60 tablet 3   Cholecalciferol (VITAMIN D3) 25 MCG (1000 UT) CAPS Take 1,000 Units by mouth daily.      esomeprazole (NEXIUM) 20 MG capsule Take 20 mg by mouth daily.  estradiol  (ESTRACE  VAGINAL) 0.1 MG/GM vaginal cream Place 1 applicatorful vaginally at bedtime as directed 42.5 g 12   famotidine  (PEPCID ) 20 MG tablet Take 20 mg by mouth as needed for heartburn or indigestion.     lidocaine -prilocaine  (EMLA ) cream Apply topically as needed. 30 g 1   magnesium  oxide (MAG-OX) 400 (240 Mg) MG tablet Take 1 tablet (400 mg total) by mouth daily. 30 tablet 1   Multiple Vitamins-Minerals (MULTI FOR HER PO)  Take 1 tablet by mouth daily.     ondansetron  (ZOFRAN -ODT) 4 MG disintegrating tablet Take 1 tablet (4 mg total) by mouth every 8 (eight) hours as needed for nausea or vomiting. 20 tablet 0   oxyCODONE  (OXY IR/ROXICODONE ) 5 MG immediate release tablet Take 1 tablet (5 mg total) by mouth every 6 (six) hours as needed for severe pain (pain score 7-10) or moderate pain (pain score 4-6). 90 tablet 0   polyethylene glycol (MIRALAX  / GLYCOLAX ) 17 g packet Take 17 g by mouth 2 (two) times daily. 14 each 0   prochlorperazine  (COMPAZINE ) 10 MG tablet Take 1 tablet by mouth every 6 hours as needed (Nausea or vomiting). 30 tablet 1   No current facility-administered medications for this visit.    VITAL SIGNS: There were no vitals taken for this visit. There were no vitals filed for this visit.  Estimated body mass index is 25.2 kg/m as calculated from the following:   Height as of 10/05/23: 5' 2 (1.575 m).   Weight as of 10/05/23: 137 lb 12.8 oz (62.5 kg).   PERFORMANCE STATUS (ECOG) : 1 - Symptomatic but completely ambulatory   Physical Exam General: NAD Cardiovascular: regular rate and rhythm Pulmonary: normal breathing pattern Extremities: no edema, no joint deformities Skin: no rashes Neurological: AAO x3  IMPRESSION: Discussed the use of AI scribe software for clinical note transcription with the patient, who gave verbal consent to proceed.  History of Present Illness Stacy Rivas is a 68 year old female who I connected with by phone for symptom management follow-up. No acute distress noted. Denies concerns of nausea, vomiting, constipation, or diarrhea. No pain or discomfort. Occasional fatigue however remains active. Taking things one day at time.   She reports the swelling in her ankle has decreased significantly, however she continues to have some discomfort, stiffness, and limited mobility in the ankle area. She relates this to her experiences swelling in her left ankle,  which is not the leg with the blood clot. The swelling increases during the day and reduces at night. She uses a thigh-high compression sock on the right leg with the blood clot to manage symptoms. We discussed dependant edema and use of knee high hose to left leg for additional support.   We discussed PT evaluation for strengthen and assistance with mobility. She verbalized understanding and appreciation.   Patient is inquiring about discontinuing use of magnesium . She has not had labs drawn since May. Advised we will evaluate in several weeks and consider adjusting medications at that time. Patient also to have Port flush with labs.   All questions answered and support provided.   Goals of Care  We discussed the patient's current illness and what it means in the larger context of their on-going co-morbidities. Natural disease trajectory and expectations were discussed. Stacy Rivas is realistic in her understanding of incurable cancer. She speaks to awareness of no additional treatment options with a goal of focusing on her comfort.   We discussed goals and  philosophy of palliative and hospice. Education provided on ongoing symptom management and hospice referral when appropriate and patient/family feels ready.   She discusses her end-of-life care preferences, expressing a desire not to be resuscitated in a terminal state and preferring inpatient hospice care over dying at home. She wishes to avoid a feeding tube but is open to IV fluids if dehydrated for a defined trial. Education provided on MOST and wishes have been documented and completed as desired.   The patient and family outlined their wishes for the following treatment decisions:  Cardiopulmonary Resuscitation: Do Not Attempt Resuscitation (DNR/No CPR)  Medical Interventions: Limited Additional Interventions: Use medical treatment, IV fluids and cardiac monitoring as indicated, DO NOT USE intubation or mechanical ventilation. May  consider use of less invasive airway support such as BiPAP or CPAP. Also provide comfort measures. Transfer to the hospital if indicated. Avoid intensive care.  If in terminal state or no recovery wishes to focus on comfort. Patient wishes to spend last moments at inpatient hospice unit. No desire to pass away in the home if possible.   Antibiotics: Determine use of limitation of antibiotics when infection occurs  IV Fluids: IV fluids for a defined trial period  Feeding Tube: No feeding tube    I discussed the importance of continued conversation with family and their medical providers regarding overall plan of care and treatment options, ensuring decisions are within the context of the patients values and GOCs. Assessment & Plan Cancer No active treatment. Under palliative care with focus on symptom management and quality of life.  Dependent edema of left ankle Swelling decreases at night and increases during the day, likely due to prolonged standing or sitting. Patient having stiffness and discomfort limiting mobility.  - Advise use of knee-high compression socks to manage swelling. - Recommend leg elevation or standing every two to three hours if sitting for extended periods. -PT eval and treat  Goals of Care Prefers no resuscitation in terminal state or natural passing. Desires hospital evaluation if critically ill but no extensive interventions if terminal. Prefers inpatient hospice at end of life. - Document DNR status and end-of-life care preferences in medical records. - Ensure family awareness of her hospitalization and hospice care wishes. - Facilitate transition to inpatient hospice care when appropriate. -Goals are clear to continue to treat the treatable. Aggressively manage symptoms as needed ensuring best quality of life for as long as she can. Patient and family realistic in their understanding of cancer status and plan of care   I will plant to follow-up with patient in 4-6  weeks. Sooner if needed. Labs and port flush in 1-2 weeks. CBC, CMP, Mg  Patient expressed understanding and was in agreement with this plan. She also understands that She can call the clinic at any time with any questions, concerns, or complaints.   Any controlled substances utilized were prescribed in the context of palliative care. PDMP has been reviewed.   Visit consisted of counseling and education dealing with the complex and emotionally intense issues of symptom management and palliative care in the setting of serious and potentially life-threatening illness.  Stacy Rivas, AGPCNP-BC  Palliative Medicine Team/[facility]

## 2023-11-08 ENCOUNTER — Encounter: Payer: Self-pay | Admitting: Hematology and Oncology

## 2023-11-08 ENCOUNTER — Other Ambulatory Visit (HOSPITAL_COMMUNITY): Payer: Self-pay

## 2023-11-08 NOTE — Therapy (Unsigned)
 OUTPATIENT PHYSICAL THERAPY  LOWER EXTREMITY ONCOLOGY EVALUATION  Patient Name: MURRIEL HOLWERDA MRN: [id number] DOB:03/28/1956, 68 y.o., female Today's Date: 11/09/2023  END OF SESSION:  PT End of Session - 11/09/23 1518     Visit Number 1    Number of Visits 4    Date for PT Re-Evaluation 12/07/23    PT Start Time 1002    PT Stop Time 1055    PT Time Calculation (min) 53 min    Activity Tolerance Patient tolerated treatment well    Behavior During Therapy WFL for tasks assessed/performed          Past Medical History:  Diagnosis Date   Anxiety state, unspecified    Diverticulosis    Esophageal reflux    Family history of breast cancer    Family history of lymphoma    Family history of stomach cancer    Family history of uterine cancer    Fibroid tumor    Headache    due to allergies    Hiatal hernia    Hyperlipidemia    IBS (irritable bowel syndrome)    Internal hemorrhoids    Nonspecific elevation of levels of transaminase or lactic acid dehydrogenase (LDH)    ovarian ca dx'd 11/2018   Renal stone 07/2013   Tubulovillous adenoma of colon    Varicose veins    superficail thrombophlebitis (left LE)   Past Surgical History:  Procedure Laterality Date   CATARACT EXTRACTION, BILATERAL     cystoscopy with instillation for cystitis  2001   DEBULKING N/A 02/12/2019   Procedure: DEBULKING OF TUMOR;  Surgeon: Alphonso Aschoff, MD;  Location: [facility];  Service: Gynecology;  Laterality: N/A;   DILATION AND CURETTAGE OF UTERUS     miscarriage   HYSTERECTOMY ABDOMINAL WITH SALPINGO-OOPHORECTOMY Bilateral 02/12/2019   Procedure: HYSTERECTOMY ABDOMINAL WITH SALPINGO-OOPHORECTOMY;  Surgeon: Alphonso Aschoff, MD;  Location: [facility];  Service: Gynecology;  Laterality: Bilateral;   IR IMAGING GUIDED PORT INSERTION  12/06/2018   IR IMAGING GUIDED PORT INSERTION  10/10/2019   IR PARACENTESIS  12/03/2018   IR PARACENTESIS  12/14/2018   IR REMOVAL TUN ACCESS W/ PORT W/O FL MOD SED  06/03/2019    LAPAROSCOPY     adhesions   LAPAROTOMY N/A 07/19/2023   Procedure: EXPLORATORY LAPAROTOMY, DIVERTING LOOP COLOSTOMY;  Surgeon: Caralyn Chandler, MD;  Location: [facility];  Service: General;  Laterality: N/A;   LITHOTRIPSY  07/2013   x 2   MYOMECTOMY  1992   OMENTECTOMY N/A 02/12/2019   Procedure: OMENTECTOMY;  Surgeon: Alphonso Aschoff, MD;  Location: [facility];  Service: Gynecology;  Laterality: N/A;   PARACENTESIS  11/21/2018   abdominal , removed 3.1 Liters    Patient Active Problem List   Diagnosis Date Noted   Parastomal hernia without obstruction or gangrene 10/19/2023   Bilateral hydronephrosis 09/28/2023   Bilateral inguinal hernia without obstruction or gangrene 09/27/2023   Peristomal dermatitis 09/19/2023   Hypomagnesemia 09/14/2023   Atrophic vaginitis 08/24/2023   Acute deep vein thrombosis (DVT) of right lower extremity (HCC) 08/18/2023   UTI (urinary tract infection) 08/16/2023   Cancer associated pain 08/03/2023   Irritant contact dermatitis associated with fecal stoma 08/03/2023   Colostomy complication (HCC) 08/03/2023   Colostomy care (HCC) 07/22/2023   Malnutrition of moderate degree 07/19/2023   Hypokalemia 07/17/2023   Large bowel obstruction (HCC) 07/16/2023   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   Other constipation 07/11/2023   Hand foot syndrome 05/25/2023  COVID-19 virus infection 04/01/2022   Sinus infection 03/08/2022   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 02/01/2022   Nasal sore 01/20/2022   Pancytopenia, acquired (HCC) 01/18/2022   Essential hypertension 01/17/2022   Leukopenia due to antineoplastic chemotherapy (HCC) 10/21/2021   Abdominal pain 09/28/2021   Pelvic floor dysfunction in female 04/05/2021   Leg cramps 11/02/2020   Bilateral bunions 09/08/2020   Mucositis due to antineoplastic therapy 08/04/2020   Anorexia 08/04/2020   Chemotherapy-induced fatigue 05/04/2020   Foot pain, bilateral 05/04/2020   Chemotherapy-induced nausea 03/09/2020    Vaginal dryness, menopausal 02/10/2020   Thrombocytopenia (HCC) 10/22/2019   Skin rash 10/14/2019   Foreign body reaction of the skin 07/02/2019   Genetic testing 04/25/2019   Hyperglycemia, drug-induced 04/01/2019   Elevated liver enzymes 04/01/2019   Family history of breast cancer    Family history of uterine cancer    Family history of lymphoma    Family history of stomach cancer    Preventive measure 03/05/2019   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   Anemia due to antineoplastic chemotherapy 01/17/2019   Peritoneal carcinomatosis (HCC) 11/30/2018   Goals of care, counseling/discussion 11/30/2018   Left ovarian epithelial cancer (HCC) 11/29/2018   Uterine leiomyoma 02/28/2017   Vitreous syneresis, bilateral 01/30/2017   Hyperopia with astigmatism and presbyopia, bilateral 01/30/2017   Dry eyes, bilateral 01/30/2017   Dermatochalasis of eyelids of both eyes 01/30/2017   Posterior capsular opacification of both eyes, obscuring vision 01/30/2017   FLATULENCE-GAS-BLOATING 05/29/2008   ABDOMINAL PAIN RIGHT UPPER QUADRANT 05/29/2008   ABNORMAL TRANSAMINASE-LFT'S 05/29/2008   Anxiety state 05/28/2008   GERD 05/28/2008   HIATAL HERNIA 05/28/2008   IBS 05/28/2008   Hiatal hernia 05/28/2008    PCP: Suan Elm, MD  REFERRING PROVIDER: Bertis Brochure, NP  REFERRING DIAG:  Z51.5 (ICD-10-CM) - Palliative care patient  R53.0 (ICD-10-CM) - Neoplastic malignant related fatigue  C79.60 (ICD-10-CM) - 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, unspecified laterality (HCC)    THERAPY DIAG:  Palliative care patient  Neoplastic malignant related fatigue  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 Her leg has reduced considerably. She has been wearing a  Thigh high compression stocking for about 2 months.  She feels she is lacking mobility in her right ankle. She gets some intermittent pins and needles in the anterior ankle, with some discomfort, stiffness and limited mobility. Swelling increases in the daytime and reduces at night. She does not think her thigh has really swollen any more. Pt is under palliative care because there is no further chemo for her, however, she wants to be the best that she can. She does note that she has mild neuropathy in her feet.   PERTINENT HISTORY:   68 y.o. 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 Palliative is seeing patient for symptom management and goals of care.  She had colostomy placed 07/19/2023 PAIN:  Are you having pain? No  PRECAUTIONS: Right LE swelling from DVT(on Elliquis),Peritoneal carcinomatosis,  RED FLAGS: Bowel or bladder incontinence: Yes: has ostomy pouch   WEIGHT BEARING RESTRICTIONS: No  FALLS:  Has patient fallen in last 6 months? No  LIVING ENVIRONMENT: Lives with: [facility] Lives in: House/apartment   OCCUPATION: on Medical leave from sales  LEISURE: read, walker but not since blood clot.  PRIOR LEVEL OF FUNCTION: Independent  PATIENT GOALS: improve mobility right ankle,check swelling   OBJECTIVE: Note: Objective measures were completed at Evaluation unless otherwise noted.  COGNITION: Overall cognitive status: Within functional limits for tasks assessed   PALPATION: Mild pitting right foot and ankle, mild warmth right foot/ankle but has been in stocking  OBSERVATIONS /  OTHER ASSESSMENTS: increased edema noted right foot and ankle, thigh despite wearing compression garment. Mild rubor right foot/toes greater than left but with good capillary refill. Does not appear infected  SENSATION: Light touch: Deficits      POSTURE: WFL  LOWER EXTREMITY STRENGTH:  ROM Right eval  Hip flexion   Hip extension   Hip abduction   Hip adduction   Hip internal rotation   Hip external rotation   Knee flexion   Knee extension   Ankle dorsiflexion 2  Ankle plantarflexion 50  Ankle inversion 24  Ankle eversion 12  Great toe extension    (Blank rows = not tested)  ROM LEFT eval  Hip flexion   Hip extension   Hip abduction   Hip adduction   Hip internal rotation   Hip external rotation   Knee flexion   Knee extension   Ankle dorsiflexion 5  Ankle plantarflexion 60  Ankle inversion 25  Ankle eversion 12  Great toe extension     (Blank rows = not tested)  Bilateral Ankle Strength:5/5   LYMPHEDEMA ASSESSMENTS:   SURGERY TYPE/DATE: Abdominal hysterectomy 02/12/2019, Colostomy 09/26/2023  NUMBER OF LYMPH NODES REMOVED: not aware of any  CHEMOTHERAPY: neoadjuvant  RADIATION:O  HORMONE TREATMENT: NO  INFECTIONS: NO  LYMPHEDEMA ASSESSMENTS:   LOWER EXTREMITY LANDMARK RIGHT eval  At groin   30 cm proximal to suprapatella   20 cm proximal to suprapatella 55  10 cm proximal to suprapatella 45  At midpatella / popliteal crease 36  30 cm proximal to floor at lateral plantar foot 35.1  20 cm proximal to floor at lateral plantar foot 25.8  10 cm proximal to floor at lateral plantar foot 20.5  Circumference of ankle/heel 29  5 cm proximal to 1st MTP joint   Across MTP joint 20.3  Around proximal great toe 7.4  (Blank rows = not tested)  LOWER EXTREMITY LANDMARK LEFT eval  At groin   30 cm proximal to suprapatella   20 cm proximal to suprapatella 53  10 cm proximal to suprapatella 42.8  At midpatella / popliteal crease 35.6  30 cm proximal  to floor at lateral plantar foot 34.4  20 cm proximal to floor at lateral plantar foot 25.4  10 cm proximal to floor at lateral plantar foot 19.7  Circumference of ankle/heel 28.3  5 cm proximal to 1st MTP joint   Across MTP joint 19.7  Around proximal great toe 6.9  (Blank rows = not tested)  FUNCTIONAL TESTS:  Balance screen; difficulty with tandem stance (less than 10 seconds with Right foot forward, SLS limited B to a few seconds  GAIT:Near normal. Mild decreased heel strike toe off                                                                                                                                TREATMENT DATE:  619/2025 Measured circumference of bilateral LE's and ROM/strength of Bilateral ankles. Pt noted to have mild increased edema throughout Right LE but most limiting for her in ankle ROM for DR and PF. She has good capillary refill, but noted rubor right foot greater than left with mild pitting edema at the ankle. She is wearing a 15-20 mm thigh high compression stocking. We discussed several options; a 20-30 compression stocking to try and prevent ankle swelling so she would have improved ROM, or a knee high stocking over her present stocking to increase compression at the foot and ankle. She is going to return to [facility] to see about getting a slightly stronger stocking.  She has also been encouraged to elevate her legs as much as possible when sitting.She does have mild neuropathy which may be affecting her balance. She will benefit from education in foot and ankle ROM, gastroc stretching, and balance activities for a HEP. We have set up 1 appt per week for 2 weeks.    PATIENT EDUCATION:  Education details: see 11/08/2023 Person educated: Patient Education method: Explanation Education comprehension: verbalized understanding  HOME EXERCISE PROGRAM:   ASSESSMENT:  CLINICAL IMPRESSION: Patient is a 68 y.o. female who was seen today for physical therapy  evaluation and treatment for complaints of right ankle swelling and limitations in ROM since she had a DVT several months ago.. Pt noted to have mild increased edema throughout Right LE but most limiting for her in ankle ROM for DR and PF. She has good capillary refill, but noted rubor right foot greater than left with mild pitting edema at the ankle. She is wearing a 15-20 mm thigh high compression stocking. We discussed several options; a 20-30 compression stocking to try and prevent ankle swelling so she would have improved ROM, or a knee high stocking over her present stocking to increase compression at the foot and ankle. She is going to return to [facility] to see about getting a slightly stronger  stocking.  She has also been encouraged to elevate her legs as much as possible when sitting.She does have mild neuropathy which may be affecting her balance. She will benefit from education in foot and ankle ROM, gastroc stretching, and balance activities for a HEP. We have set up 1 appt per week for 2 weeks.   OBJECTIVE IMPAIRMENTS: decreased balance, decreased knowledge of condition, decreased mobility, and increased edema.   ACTIVITY LIMITATIONS: sitting, standing, and locomotion level  PARTICIPATION LIMITATIONS: pt is able to participate in all but with increased swelling  PERSONAL FACTORS: 3+ comorbidities: left ovarian cancer with peritoneal carcinomatosis with no further chemo available are also affecting patient's functional outcome.   REHAB POTENTIAL: Good  CLINICAL DECISION MAKING: Evolving/moderate complexity  EVALUATION COMPLEXITY: Moderate   GOALS: Goals reviewed with patient? Yes  SHORT TERM GOALS=LONG TERM GOALS: Target date: 12/07/2023  Pt will have decreased right ankle swelling/ limitations with 20-30 compression garment Baseline: Goal status: INITIAL  2.  Pt will be independent with a HEP for ankle ROM and balance Baseline:  Goal status: INITIAL PLAN:  PT  FREQUENCY: 1x/weekprn  PT DURATION: 4 weeks  PLANNED INTERVENTIONS: 97164- PT Re-evaluation, 97110-Therapeutic exercises, 53664- Neuromuscular re-education, 97535- Self Care, 40347- Manual therapy, 97116- Gait training, [id number]- Orthotic Initial, Therapeutic exercises, Therapeutic activity, Neuromuscular re-education, Gait training, and Self Care  PLAN FOR NEXT SESSION: check new 20-30 stocking; helping ankle swelling? Instruct gastroc stretch, balance exs; vector reach, SLS, tandem stance. Instruct HEP. Should only need a visit or 2.   Latisha Poland, PT 11/09/2023, 5:18 PM

## 2023-11-08 NOTE — Telephone Encounter (Signed)
 Short-Term Disability paperwork has been sent to the s drive.

## 2023-11-09 ENCOUNTER — Other Ambulatory Visit: Payer: Self-pay

## 2023-11-09 ENCOUNTER — Ambulatory Visit: Attending: Nurse Practitioner

## 2023-11-09 DIAGNOSIS — C7960 Secondary malignant neoplasm of unspecified ovary: Secondary | ICD-10-CM | POA: Diagnosis present

## 2023-11-09 DIAGNOSIS — R530 Neoplastic (malignant) related fatigue: Secondary | ICD-10-CM | POA: Diagnosis present

## 2023-11-09 DIAGNOSIS — Z515 Encounter for palliative care: Secondary | ICD-10-CM | POA: Insufficient documentation

## 2023-11-09 DIAGNOSIS — R2689 Other abnormalities of gait and mobility: Secondary | ICD-10-CM | POA: Diagnosis present

## 2023-11-09 DIAGNOSIS — I890 Lymphedema, not elsewhere classified: Secondary | ICD-10-CM | POA: Diagnosis present

## 2023-11-10 ENCOUNTER — Telehealth: Payer: Self-pay

## 2023-11-10 NOTE — Telephone Encounter (Signed)
 FMLA form completed.

## 2023-11-10 NOTE — Telephone Encounter (Signed)
 LVM for pt regarding her Disability forms being completed,faxed, and confirmation received. Will f/u with pt on Monday.No questions or concerns to be noted.

## 2023-11-13 ENCOUNTER — Inpatient Hospital Stay

## 2023-11-13 ENCOUNTER — Telehealth: Payer: Self-pay

## 2023-11-13 ENCOUNTER — Ambulatory Visit (HOSPITAL_COMMUNITY)
Admission: RE | Admit: 2023-11-13 | Discharge: 2023-11-13 | Discharge status: Home / Self Care | Source: Ambulatory Visit | Attending: *Deleted | Admitting: *Deleted

## 2023-11-13 DIAGNOSIS — Z515 Encounter for palliative care: Secondary | ICD-10-CM

## 2023-11-13 DIAGNOSIS — Z66 Do not resuscitate: Secondary | ICD-10-CM | POA: Diagnosis not present

## 2023-11-13 DIAGNOSIS — K9400 Colostomy complication, unspecified: Secondary | ICD-10-CM | POA: Diagnosis not present

## 2023-11-13 DIAGNOSIS — Z433 Encounter for attention to colostomy: Secondary | ICD-10-CM | POA: Diagnosis present

## 2023-11-13 DIAGNOSIS — C786 Secondary malignant neoplasm of retroperitoneum and peritoneum: Secondary | ICD-10-CM

## 2023-11-13 DIAGNOSIS — L24B3 Irritant contact dermatitis related to fecal or urinary stoma or fistula: Secondary | ICD-10-CM | POA: Diagnosis not present

## 2023-11-13 DIAGNOSIS — C7960 Secondary malignant neoplasm of unspecified ovary: Secondary | ICD-10-CM

## 2023-11-13 DIAGNOSIS — D6481 Anemia due to antineoplastic chemotherapy: Secondary | ICD-10-CM

## 2023-11-13 DIAGNOSIS — K435 Parastomal hernia without obstruction or  gangrene: Secondary | ICD-10-CM

## 2023-11-13 DIAGNOSIS — E876 Hypokalemia: Secondary | ICD-10-CM

## 2023-11-13 DIAGNOSIS — D61818 Other pancytopenia: Secondary | ICD-10-CM

## 2023-11-13 DIAGNOSIS — K9403 Colostomy malfunction: Secondary | ICD-10-CM | POA: Diagnosis not present

## 2023-11-13 DIAGNOSIS — C562 Malignant neoplasm of left ovary: Secondary | ICD-10-CM | POA: Diagnosis present

## 2023-11-13 DIAGNOSIS — Z7189 Other specified counseling: Secondary | ICD-10-CM

## 2023-11-13 LAB — MAGNESIUM: Magnesium: 1.7 mg/dL (ref 1.7–2.4)

## 2023-11-13 LAB — CBC WITH DIFFERENTIAL (CANCER CENTER ONLY)
Abs Immature Granulocytes: 0.01 10*3/uL (ref 0.00–0.07)
Basophils Absolute: 0 10*3/uL (ref 0.0–0.1)
Basophils Relative: 1 %
Eosinophils Absolute: 0.1 10*3/uL (ref 0.0–0.5)
Eosinophils Relative: 1 %
HCT: 34.3 % — ABNORMAL LOW (ref 36.0–46.0)
Hemoglobin: 11.3 g/dL — ABNORMAL LOW (ref 12.0–15.0)
Immature Granulocytes: 0 %
Lymphocytes Relative: 26 %
Lymphs Abs: 1.7 10*3/uL (ref 0.7–4.0)
MCH: 29.9 pg (ref 26.0–34.0)
MCHC: 32.9 g/dL (ref 30.0–36.0)
MCV: 90.7 fL (ref 80.0–100.0)
Monocytes Absolute: 0.6 10*3/uL (ref 0.1–1.0)
Monocytes Relative: 9 %
Neutro Abs: 4.2 10*3/uL (ref 1.7–7.7)
Neutrophils Relative %: 63 %
Platelet Count: 271 10*3/uL (ref 150–400)
RBC: 3.78 MIL/uL — ABNORMAL LOW (ref 3.87–5.11)
RDW: 17.5 % — ABNORMAL HIGH (ref 11.5–15.5)
WBC Count: 6.6 10*3/uL (ref 4.0–10.5)
nRBC: 0 % (ref 0.0–0.2)

## 2023-11-13 LAB — CMP (CANCER CENTER ONLY)
ALT: 22 U/L (ref 0–44)
AST: 26 U/L (ref 15–41)
Albumin: 3.9 g/dL (ref 3.5–5.0)
Alkaline Phosphatase: 88 U/L (ref 38–126)
Anion gap: 7 (ref 5–15)
BUN: 12 mg/dL (ref 8–23)
CO2: 28 mmol/L (ref 22–32)
Calcium: 9.2 mg/dL (ref 8.9–10.3)
Chloride: 106 mmol/L (ref 98–111)
Creatinine: 0.49 mg/dL (ref 0.44–1.00)
GFR, Estimated: >60 mL/min (ref >60)
Glucose, Bld: 88 mg/dL (ref 70–99)
Potassium: 3.4 mmol/L — ABNORMAL LOW (ref 3.5–5.1)
Sodium: 141 mmol/L (ref 135–145)
Total Bilirubin: 1.1 mg/dL (ref 0.0–1.2)
Total Protein: 6.7 g/dL (ref 6.5–8.1)

## 2023-11-13 LAB — SAMPLE TO BLOOD BANK

## 2023-11-13 MED ORDER — SODIUM CHLORIDE 0.9% FLUSH
10.0000 mL | Freq: Once | INTRAVENOUS | Status: AC
  Administered 2023-11-13: 10 mL

## 2023-11-13 MED ORDER — HEPARIN SOD (PORK) LOCK FLUSH 100 UNIT/ML IV SOLN
250.0000 [IU] | Freq: Once | INTRAVENOUS | Status: AC
  Administered 2023-11-13: 250 [IU]

## 2023-11-13 NOTE — Telephone Encounter (Signed)
 Short-term disability paperwork has been uploaded to the s drive.

## 2023-11-13 NOTE — Progress Notes (Signed)
 [facility]   Reason for visit:  LLQ colostomy with parastomal hernia.  Today, with stomal prolapse.  HPI:  Peritoneal carcinomatosis Past Medical History:  Diagnosis Date   Anxiety state, unspecified    Diverticulosis    Esophageal reflux    Family history of breast cancer    Family history of lymphoma    Family history of stomach cancer    Family history of uterine cancer    Fibroid tumor    Headache    due to allergies    Hiatal hernia    Hyperlipidemia    IBS (irritable bowel syndrome)    Internal hemorrhoids    Nonspecific elevation of levels of transaminase or lactic acid dehydrogenase (LDH)    ovarian ca dx'd 11/2018   Renal stone 07/2013   Tubulovillous adenoma of colon    Varicose veins    superficail thrombophlebitis (left LE)   Family History  Problem Relation Age of Onset   Lymphoma Father        dx 27s   Heart disease Father    Hypertension Father    Hypertension Mother    Hyperlipidemia Mother    Breast cancer Mother 35   Diabetes Maternal Grandmother    Uterine cancer Maternal Grandmother        dx 53s   Stomach cancer Paternal Grandfather    Breast cancer Other        grandmother's sisters, both dx 44s   Colon cancer Neg Hx    Allergies  Allergen Reactions   Carboplatin  Other (See Comments)    Chest tightness/burning, flushing.  See Progress Note from 12/29/2022.   Skin Adhesives [Cyanoacrylate] Hives   Shrimp [Shellfish Allergy] Itching   Sulfasalazine Nausea And Vomiting   Sulfonamide Derivatives Nausea Only   Telithromycin     Other Reaction(s): severe dizziness   Current Outpatient Medications  Medication Sig Dispense Refill Last Dose/Taking   acetaminophen  (TYLENOL ) 500 MG tablet Take 500 mg by mouth every 6 (six) hours as needed for mild pain (pain score 1-3) or moderate pain (pain score 4-6).      ALPRAZolam  (XANAX ) 0.25 MG tablet Take 1 tablet (0.25 mg total) by mouth 2 (two) times daily as needed for anxiety or sleep. 60  tablet 0    apixaban  (ELIQUIS ) 5 MG TABS tablet Take 1 tablet (5 mg total) by mouth 2 (two) times daily. 60 tablet 3    Cholecalciferol (VITAMIN D3) 25 MCG (1000 UT) CAPS Take 1,000 Units by mouth daily.       esomeprazole (NEXIUM) 20 MG capsule Take 20 mg by mouth daily.       estradiol  (ESTRACE  VAGINAL) 0.1 MG/GM vaginal cream Place 1 applicatorful vaginally at bedtime as directed 42.5 g 12    famotidine  (PEPCID ) 20 MG tablet Take 20 mg by mouth as needed for heartburn or indigestion.      lidocaine -prilocaine  (EMLA ) cream Apply topically as needed. 30 g 1    magnesium  oxide (MAG-OX) 400 (240 Mg) MG tablet Take 1 tablet (400 mg total) by mouth daily. 30 tablet 1    Multiple Vitamins-Minerals (MULTI FOR HER PO) Take 1 tablet by mouth daily.      ondansetron  (ZOFRAN -ODT) 4 MG disintegrating tablet Take 1 tablet (4 mg total) by mouth every 8 (eight) hours as needed for nausea or vomiting. 20 tablet 0    oxyCODONE  (OXY IR/ROXICODONE ) 5 MG immediate release tablet Take 1 tablet (5 mg total) by mouth every 6 (six) hours  as needed for severe pain (pain score 7-10) or moderate pain (pain score 4-6). 90 tablet 0    polyethylene glycol (MIRALAX  / GLYCOLAX ) 17 g packet Take 17 g by mouth 2 (two) times daily. (Patient not taking: Reported on 11/09/2023) 14 each 0    prochlorperazine  (COMPAZINE ) 10 MG tablet Take 1 tablet by mouth every 6 hours as needed (Nausea or vomiting). 30 tablet 1    No current facility-administered medications for this encounter.   ROS  Review of Systems Vital signs:  BP 128/65   Pulse 87   Temp 99 F (37.2 C) (Oral)   Resp 18   SpO2 98%  Exam:  Physical Exam  Stoma type/location:  LLQ colostomy Stomal assessment/size:  oval 2.4 cm x3 cm   The efferent limb has prolapsed and extends 4 cm today. This reduces when she lies down with gentle palpation.  Peristomal assessment:   Some denuded peristomal skin .  Continues to have itching beneath the pouch at times. Today, we will  apply flonase to peristomal skin.  COntinue stoma powder skin prep and barrier ring with 4 inch pouch.  Treatment options for stomal/peristomal skin: begin flonase, stoma powder and skin prep  barrier ring.   Output: soft brown stool Ostomy pouching: 1pc. Flat 4 inch coloplast pouch.  Education provided:  I remove old pouch, we discuss the prolapse, and I demonstrate the application of flonase with each pouch change.     Impression/dx  Colostomy Parastomal hernia Prolapsed stoma Irritant contact dermatitis Discussion  *** Plan  ***    Visit time: *** minutes.   Darice Cooley FNP-BC

## 2023-11-15 DIAGNOSIS — K9419 Other complications of enterostomy: Secondary | ICD-10-CM | POA: Insufficient documentation

## 2023-11-15 NOTE — Progress Notes (Signed)
 [facility]   Reason for visit:  LLQ colostomy with parastomal hernia HPI:  Peritoneal carcinomatosis Past Medical History:  Diagnosis Date   Anxiety state, unspecified    Diverticulosis    Esophageal reflux    Family history of breast cancer    Family history of lymphoma    Family history of stomach cancer    Family history of uterine cancer    Fibroid tumor    Headache    due to allergies    Hiatal hernia    Hyperlipidemia    IBS (irritable bowel syndrome)    Internal hemorrhoids    Nonspecific elevation of levels of transaminase or lactic acid dehydrogenase (LDH)    ovarian ca dx'd 11/2018   Renal stone 07/2013   Tubulovillous adenoma of colon    Varicose veins    superficail thrombophlebitis (left LE)   Family History  Problem Relation Age of Onset   Lymphoma Father        dx 21s   Heart disease Father    Hypertension Father    Hypertension Mother    Hyperlipidemia Mother    Breast cancer Mother 56   Diabetes Maternal Grandmother    Uterine cancer Maternal Grandmother        dx 11s   Stomach cancer Paternal Grandfather    Breast cancer Other        grandmother's sisters, both dx 66s   Colon cancer Neg Hx    Allergies  Allergen Reactions   Carboplatin  Other (See Comments)    Chest tightness/burning, flushing.  See Progress Note from 12/29/2022.   Skin Adhesives [Cyanoacrylate] Hives   Shrimp [Shellfish Allergy] Itching   Sulfasalazine Nausea And Vomiting   Sulfonamide Derivatives Nausea Only   Telithromycin     Other Reaction(s): severe dizziness   Current Outpatient Medications  Medication Sig Dispense Refill Last Dose/Taking   acetaminophen  (TYLENOL ) 500 MG tablet Take 500 mg by mouth every 6 (six) hours as needed for mild pain (pain score 1-3) or moderate pain (pain score 4-6).      ALPRAZolam  (XANAX ) 0.25 MG tablet Take 1 tablet (0.25 mg total) by mouth 2 (two) times daily as needed for anxiety or sleep. 60 tablet 0    apixaban  (ELIQUIS ) 5  MG TABS tablet Take 1 tablet (5 mg total) by mouth 2 (two) times daily. 60 tablet 3    Cholecalciferol (VITAMIN D3) 25 MCG (1000 UT) CAPS Take 1,000 Units by mouth daily.       esomeprazole (NEXIUM) 20 MG capsule Take 20 mg by mouth daily.       estradiol  (ESTRACE  VAGINAL) 0.1 MG/GM vaginal cream Place 1 applicatorful vaginally at bedtime as directed 42.5 g 12    famotidine  (PEPCID ) 20 MG tablet Take 20 mg by mouth as needed for heartburn or indigestion.      lidocaine -prilocaine  (EMLA ) cream Apply topically as needed. 30 g 1    magnesium  oxide (MAG-OX) 400 (240 Mg) MG tablet Take 1 tablet (400 mg total) by mouth daily. 30 tablet 1    Multiple Vitamins-Minerals (MULTI FOR HER PO) Take 1 tablet by mouth daily.      ondansetron  (ZOFRAN -ODT) 4 MG disintegrating tablet Take 1 tablet (4 mg total) by mouth every 8 (eight) hours as needed for nausea or vomiting. 20 tablet 0    oxyCODONE  (OXY IR/ROXICODONE ) 5 MG immediate release tablet Take 1 tablet (5 mg total) by mouth every 6 (six) hours as needed for severe pain (pain  score 7-10) or moderate pain (pain score 4-6). 90 tablet 0    polyethylene glycol (MIRALAX  / GLYCOLAX ) 17 g packet Take 17 g by mouth 2 (two) times daily. (Patient not taking: Reported on 11/09/2023) 14 each 0    prochlorperazine  (COMPAZINE ) 10 MG tablet Take 1 tablet by mouth every 6 hours as needed (Nausea or vomiting). 30 tablet 1    No current facility-administered medications for this encounter.   ROS  Review of Systems  Cardiovascular:  Positive for leg swelling.  Gastrointestinal:        LLQ colostomy with parastomal hernia   Musculoskeletal:  Positive for arthralgias.  Skin:  Positive for color change.       Peristomal redness Nonitnact lesion around stoma at 12 o'clock.   Psychiatric/Behavioral: Negative.    All other systems reviewed and are negative.  Vital signs:  BP (P) 123/63 (BP Location: Right Arm)   Pulse (P) 88   Temp (P) 97.9 F (36.6 C) (Oral)   Resp (P)  18   SpO2 (P) 98%  Exam:  Physical Exam Constitutional:      Appearance: Normal appearance.  HENT:     Mouth/Throat:     Mouth: Mucous membranes are moist.   Cardiovascular:     Rate and Rhythm: Normal rate and regular rhythm.     Pulses: Normal pulses.     Heart sounds: Normal heart sounds.  Pulmonary:     Breath sounds: Normal breath sounds.  Abdominal:     Palpations: Abdomen is soft.     Hernia: A hernia is present.   Musculoskeletal:        General: Normal range of motion.   Skin:    General: Skin is warm and dry.     Findings: Erythema present.   Neurological:     Mental Status: She is alert and oriented to person, place, and time. Mental status is at baseline.   Psychiatric:        Mood and Affect: Mood normal.        Behavior: Behavior normal.     Stoma type/location:  LLQ colostomy Stomal assessment/size:  oval Peristomal assessment:  nonintact lesion at 12 o'clock  continue silver hydrofiber to this area and protect with barrier ring  Treatment options for stomal/peristomal skin: 4 inch pouch stoma powder skin prep  Output: soft brown stool Ostomy pouching: 1pc. 4 inch flat coloplast pouch  Education provided:  ongoing support.  Patient tearful at current state of health.     Impression/dx  Colostomy, complication with hernia  Discussion  See back as needed Plan  Supplies are stable, no other needs at this time.     Visit time: 45 minutes.   Darice Cooley FNP-BC

## 2023-11-15 NOTE — Discharge Instructions (Signed)
 Use flonase as demonstrated anytime skin is red, irritated or itching Apply spray, stoma powder and skin prep Barrier ring around stoma and 4 inch flat pouch

## 2023-11-16 ENCOUNTER — Ambulatory Visit: Payer: Self-pay

## 2023-11-16 ENCOUNTER — Ambulatory Visit

## 2023-11-16 ENCOUNTER — Telehealth: Payer: Self-pay | Admitting: *Deleted

## 2023-11-16 DIAGNOSIS — R2689 Other abnormalities of gait and mobility: Secondary | ICD-10-CM

## 2023-11-16 DIAGNOSIS — I890 Lymphedema, not elsewhere classified: Secondary | ICD-10-CM

## 2023-11-16 DIAGNOSIS — Z515 Encounter for palliative care: Secondary | ICD-10-CM

## 2023-11-16 DIAGNOSIS — C7960 Secondary malignant neoplasm of unspecified ovary: Secondary | ICD-10-CM

## 2023-11-16 DIAGNOSIS — R530 Neoplastic (malignant) related fatigue: Secondary | ICD-10-CM

## 2023-11-16 NOTE — Telephone Encounter (Signed)
 VENESHA PETRAITIS, [phone] (home) calling because [employer] called me saying they did not receive information from the doctor.  They received one page. Noted [employer] Disability request and [employer] FMLA form.  Completed by other forms staff, tracker reads yes, records are needed from November 02, 1923 to present.  ([facility]) [employer] releases read no releases made due to no records during time period for provider. Nena LITTIE Louder says she will come to office today or tomorrow to sign a HIPAA [facility] Authorization not signed previously in hopes of corrected need for medical records.SABRA SABRA

## 2023-11-16 NOTE — Therapy (Signed)
 OUTPATIENT PHYSICAL THERAPY  LOWER EXTREMITY ONCOLOGY EVALUATION  Patient Name: Stacy Rivas MRN: [id number] DOB:February 15, 1956, 68 y.o., female Today's Date: 11/16/2023  END OF SESSION:  PT End of Session - 11/16/23 1456     Visit Number 2    Number of Visits 4    Date for PT Re-Evaluation 12/07/23    PT Start Time 1500    PT Stop Time 1559    PT Time Calculation (min) 59 min    Activity Tolerance Patient tolerated treatment well    Behavior During Therapy WFL for tasks assessed/performed          Past Medical History:  Diagnosis Date   Anxiety state, unspecified    Diverticulosis    Esophageal reflux    Family history of breast cancer    Family history of lymphoma    Family history of stomach cancer    Family history of uterine cancer    Fibroid tumor    Headache    due to allergies    Hiatal hernia    Hyperlipidemia    IBS (irritable bowel syndrome)    Internal hemorrhoids    Nonspecific elevation of levels of transaminase or lactic acid dehydrogenase (LDH)    ovarian ca dx'd 11/2018   Renal stone 07/2013   Tubulovillous adenoma of colon    Varicose veins    superficail thrombophlebitis (left LE)   Past Surgical History:  Procedure Laterality Date   CATARACT EXTRACTION, BILATERAL     cystoscopy with instillation for cystitis  2001   DEBULKING N/A 02/12/2019   Procedure: DEBULKING OF TUMOR;  Surgeon: Eloy Herring, MD;  Location: [facility];  Service: Gynecology;  Laterality: N/A;   DILATION AND CURETTAGE OF UTERUS     miscarriage   HYSTERECTOMY ABDOMINAL WITH SALPINGO-OOPHORECTOMY Bilateral 02/12/2019   Procedure: HYSTERECTOMY ABDOMINAL WITH SALPINGO-OOPHORECTOMY;  Surgeon: Eloy Herring, MD;  Location: [facility];  Service: Gynecology;  Laterality: Bilateral;   IR IMAGING GUIDED PORT INSERTION  12/06/2018   IR IMAGING GUIDED PORT INSERTION  10/10/2019   IR PARACENTESIS  12/03/2018   IR PARACENTESIS  12/14/2018   IR REMOVAL TUN ACCESS W/ PORT W/O FL MOD SED  06/03/2019    LAPAROSCOPY     adhesions   LAPAROTOMY N/A 07/19/2023   Procedure: EXPLORATORY LAPAROTOMY, DIVERTING LOOP COLOSTOMY;  Surgeon: Curvin Deward MOULD, MD;  Location: [facility];  Service: General;  Laterality: N/A;   LITHOTRIPSY  07/2013   x 2   MYOMECTOMY  1992   OMENTECTOMY N/A 02/12/2019   Procedure: OMENTECTOMY;  Surgeon: Eloy Herring, MD;  Location: [facility];  Service: Gynecology;  Laterality: N/A;   PARACENTESIS  11/21/2018   abdominal , removed 3.1 Liters    Patient Active Problem List   Diagnosis Date Noted   Intestinal stoma prolapse (HCC) 11/15/2023   Parastomal hernia without obstruction or gangrene 10/19/2023   Bilateral hydronephrosis 09/28/2023   Bilateral inguinal hernia without obstruction or gangrene 09/27/2023   Peristomal dermatitis 09/19/2023   Hypomagnesemia 09/14/2023   Atrophic vaginitis 08/24/2023   Acute deep vein thrombosis (DVT) of right lower extremity (HCC) 08/18/2023   UTI (urinary tract infection) 08/16/2023   Cancer associated pain 08/03/2023   Irritant contact dermatitis associated with fecal stoma 08/03/2023   Colostomy complication (HCC) 08/03/2023   Colostomy care (HCC) 07/22/2023   Malnutrition of moderate degree 07/19/2023   Hypokalemia 07/17/2023   Large bowel obstruction (HCC) 07/16/2023   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   Other constipation  07/11/2023   Hand foot syndrome 05/25/2023   COVID-19 virus infection 04/01/2022   Sinus infection 03/08/2022   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 02/01/2022   Nasal sore 01/20/2022   Pancytopenia, acquired (HCC) 01/18/2022   Essential hypertension 01/17/2022   Leukopenia due to antineoplastic chemotherapy (HCC) 10/21/2021   Abdominal pain 09/28/2021   Pelvic floor dysfunction in female 04/05/2021   Leg cramps 11/02/2020   Bilateral bunions 09/08/2020   Mucositis due to antineoplastic therapy 08/04/2020   Anorexia 08/04/2020   Chemotherapy-induced fatigue 05/04/2020   Foot pain, bilateral  05/04/2020   Chemotherapy-induced nausea 03/09/2020   Vaginal dryness, menopausal 02/10/2020   Thrombocytopenia (HCC) 10/22/2019   Skin rash 10/14/2019   Foreign body reaction of the skin 07/02/2019   Genetic testing 04/25/2019   Hyperglycemia, drug-induced 04/01/2019   Elevated liver enzymes 04/01/2019   Family history of breast cancer    Family history of uterine cancer    Family history of lymphoma    Family history of stomach cancer    Preventive measure 03/05/2019   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   Anemia due to antineoplastic chemotherapy 01/17/2019   Peritoneal carcinomatosis (HCC) 11/30/2018   Goals of care, counseling/discussion 11/30/2018   Left ovarian epithelial cancer (HCC) 11/29/2018   Uterine leiomyoma 02/28/2017   Vitreous syneresis, bilateral 01/30/2017   Hyperopia with astigmatism and presbyopia, bilateral 01/30/2017   Dry eyes, bilateral 01/30/2017   Dermatochalasis of eyelids of both eyes 01/30/2017   Posterior capsular opacification of both eyes, obscuring vision 01/30/2017   FLATULENCE-GAS-BLOATING 05/29/2008   ABDOMINAL PAIN RIGHT UPPER QUADRANT 05/29/2008   ABNORMAL TRANSAMINASE-LFT'S 05/29/2008   Anxiety state 05/28/2008   GERD 05/28/2008   HIATAL HERNIA 05/28/2008   IBS 05/28/2008   Hiatal hernia 05/28/2008    PCP: Charlie Love, MD  REFERRING PROVIDER: Missouri Roys, NP  REFERRING DIAG:  Z51.5 (ICD-10-CM) - Palliative care patient  R53.0 (ICD-10-CM) - Neoplastic malignant related fatigue  C79.60 (ICD-10-CM) - 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, unspecified laterality (HCC)    THERAPY DIAG:  Palliative care patient  Neoplastic malignant related fatigue  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 Her leg has reduced considerably. She has been wearing a Thigh high compression stocking for about 2 months.  She feels she is lacking mobility in her right ankle. She gets some intermittent pins and needles in the anterior ankle, with some discomfort, stiffness and limited mobility. Swelling increases in the daytime and reduces at night. She does not think her thigh has really swollen any more. Pt is under palliative care because there is no further chemo for her, however, she wants to be the best that she can. She does note that she has mild neuropathy in her feet.   PERTINENT HISTORY:   68 y.o. 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 Palliative is seeing patient for symptom management and goals of care.  She had colostomy placed 07/19/2023 PAIN:  Are you having pain? No  PRECAUTIONS: Right LE swelling from DVT(on Elliquis),Peritoneal carcinomatosis,  RED FLAGS: Bowel or bladder incontinence: Yes: has ostomy pouch   WEIGHT BEARING RESTRICTIONS: No  FALLS:  Has patient fallen in last 6 months? No  LIVING ENVIRONMENT: Lives with: lives with their [facility]in [facility] Lives in: House/apartment   OCCUPATION: on Medical leave from sales  LEISURE: read, walker but not since blood clot.  PRIOR LEVEL OF FUNCTION: Independent  PATIENT GOALS: improve mobility right ankle,check swelling   OBJECTIVE: Note: Objective measures were completed at Evaluation unless otherwise noted.  COGNITION: Overall cognitive  status: Within functional limits for tasks assessed   PALPATION: Mild pitting right foot and ankle, mild warmth right foot/ankle but has been in stocking  OBSERVATIONS / OTHER ASSESSMENTS: increased edema noted right foot and ankle, thigh despite wearing compression garment. Mild rubor right foot/toes greater than left but with good capillary refill. Does not appear infected  SENSATION: Light touch: Deficits      POSTURE: WFL  LOWER EXTREMITY STRENGTH:  ROM Right eval  Hip flexion   Hip extension   Hip abduction   Hip adduction   Hip internal rotation   Hip external rotation   Knee flexion   Knee extension   Ankle dorsiflexion 2  Ankle plantarflexion 50  Ankle inversion 24  Ankle eversion 12  Great toe extension    (Blank rows = not tested)  ROM LEFT eval  Hip flexion   Hip extension   Hip abduction   Hip adduction   Hip internal rotation   Hip external rotation   Knee flexion   Knee extension   Ankle dorsiflexion 5  Ankle plantarflexion 60  Ankle inversion 25  Ankle eversion 12  Great toe extension     (Blank rows = not tested)  Bilateral Ankle Strength:5/5   LYMPHEDEMA ASSESSMENTS:   SURGERY TYPE/DATE: Abdominal hysterectomy 02/12/2019, Colostomy 09/26/2023  NUMBER OF LYMPH NODES REMOVED: not aware of any  CHEMOTHERAPY: neoadjuvant  RADIATION:O  HORMONE TREATMENT: NO  INFECTIONS: NO  LYMPHEDEMA ASSESSMENTS:   LOWER EXTREMITY LANDMARK RIGHT eval RIGHT 11/16/2023  At groin    30 cm proximal to suprapatella    20 cm proximal to suprapatella 55 53  10 cm proximal to suprapatella 45 45  At midpatella / popliteal crease 36   30 cm proximal to floor at lateral plantar foot 35.1   20 cm proximal to floor at lateral plantar foot 25.8 25.3  10 cm proximal to floor at  lateral plantar foot 20.5 19.6  Circumference of ankle/heel 29   5 cm proximal to 1st MTP joint    Across MTP joint 20.3   Around proximal great toe 7.4   (Blank rows = not  tested)  LOWER EXTREMITY LANDMARK LEFT eval  At groin   30 cm proximal to suprapatella   20 cm proximal to suprapatella 53  10 cm proximal to suprapatella 42.8  At midpatella / popliteal crease 35.6  30 cm proximal to floor at lateral plantar foot 34.4  20 cm proximal to floor at lateral plantar foot 25.4  10 cm proximal to floor at lateral plantar foot 19.7  Circumference of ankle/heel 28.3  5 cm proximal to 1st MTP joint   Across MTP joint 19.7  Around proximal great toe 6.9  (Blank rows = not tested)  FUNCTIONAL TESTS:  Balance screen; difficulty with tandem stance (less than 10 seconds with Right foot forward, SLS limited B to a few seconds  GAIT:Near normal. Mild decreased heel strike toe off                                                                                                                                TREATMENT DATE:  11/16/2023 Checked stocking and remeasured leg in several places; good reduction in several places with stronger stocking. Foot aspect of stocking feels a little loose, but pt will wash and try to renew the stretch there. Incline stretch x 3 runners stretch x 2 ea 30 sec holds HS stretch with strap 3 x 30 each Half tandem and full tandem stance B x 2 ea to failure Vector reaches B 2 sets of 5 with foot touch, 2 sets of 5 no touch between Supine clam red x 15  Bridging x 10 Updated HEP with pictures 619/2025 Measured circumference of bilateral LE's and ROM/strength of Bilateral ankles. Pt noted to have mild increased edema throughout Right LE but most limiting for her in ankle ROM for DR and PF. She has good capillary refill, but noted rubor right foot greater than left with mild pitting edema at the ankle. She is wearing a 15-20 mm thigh high compression stocking. We discussed several options; a 20-30 compression stocking to try and prevent ankle swelling so she would have improved ROM, or a knee high stocking over her present stocking to  increase compression at the foot and ankle. She is going to return to [facility] to see about getting a slightly stronger stocking.  She has also been encouraged to elevate her legs as much as possible when sitting.She does have mild neuropathy which may be affecting her balance. She will benefit from education in foot and ankle ROM, gastroc stretching, and balance activities for a HEP. We have set up 1 appt per week for 2 weeks.    PATIENT EDUCATION:  Access Code: [email or web address] URL: [email or web address] Date: 11/16/2023 Prepared by: Grayce Sheldon  Exercises - Supine Bridge  -  1 x daily - 7 x weekly - 1 sets - 10 reps - 5 hold - Hooklying Clamshell with Resistance  - 1 x daily - 7 x weekly - 1 sets - 10-15 reps - 3 hold - Supine Hamstring Stretch with Strap  - 1 x daily - 7 x weekly - 1 sets - 3 reps - 30 hold - Gastroc Stretch on Wall  - 1 x daily - 7 x weekly - 1 sets - 3 reps - 30 hold - Long Sitting Calf Stretch with Strap  - 1 x daily - 7 x weekly - 1 sets - 3 reps - 30 hold - Standing Gastroc Stretch on Step with Counter Support  - 1 x daily - 7 x weekly - 1 sets - 3 reps - 30 hold - Single Leg Balance  - 1 x daily - 7 x weekly - 1 sets - 5 reps    Education details: see 11/08/2023 Person educated: Patient Education method: Explanation Education comprehension: verbalized understanding  HOME EXERCISE PROGRAM:   ASSESSMENT:  CLINICAL IMPRESSION:  Pt felt good stretch with exercises, and felt looser at completion of therapy. New stocking has helped leg reduce in several areas and achilles tendon area that was very puffy last visit is much better today. Pt had more difficulty with tandem stance with right leg forward.   EVAL Patient is a 67 y.o. female who was seen today for physical therapy evaluation and treatment for complaints of right ankle swelling and limitations in ROM since she had a DVT several months ago.. Pt noted to have mild increased edema throughout  Right LE but most limiting for her in ankle ROM for DR and PF. She has good capillary refill, but noted rubor right foot greater than left with mild pitting edema at the ankle. She is wearing a 15-20 mm thigh high compression stocking. We discussed several options; a 20-30 compression stocking to try and prevent ankle swelling so she would have improved ROM, or a knee high stocking over her present stocking to increase compression at the foot and ankle. She is going to return to [facility] to see about getting a slightly stronger stocking.  She has also been encouraged to elevate her legs as much as possible when sitting.She does have mild neuropathy which may be affecting her balance. She will benefit from education in foot and ankle ROM, gastroc stretching, and balance activities for a HEP. We have set up 1 appt per week for 2 weeks.   OBJECTIVE IMPAIRMENTS: decreased balance, decreased knowledge of condition, decreased mobility, and increased edema.   ACTIVITY LIMITATIONS: sitting, standing, and locomotion level  PARTICIPATION LIMITATIONS: pt is able to participate in all but with increased swelling  PERSONAL FACTORS: 3+ comorbidities: left ovarian cancer with peritoneal carcinomatosis with no further chemo available are also affecting patient's functional outcome.   REHAB POTENTIAL: Good  CLINICAL DECISION MAKING: Evolving/moderate complexity  EVALUATION COMPLEXITY: Moderate   GOALS: Goals reviewed with patient? Yes  SHORT TERM GOALS=LONG TERM GOALS: Target date: 12/07/2023  Pt will have decreased right ankle swelling/ limitations with 20-30 compression garment Baseline: Goal status: INITIAL  2.  Pt will be independent with a HEP for ankle ROM and balance Baseline:  Goal status: INITIAL PLAN:  PT FREQUENCY: 1x/weekprn  PT DURATION: 4 weeks  PLANNED INTERVENTIONS: 97164- PT Re-evaluation, 97110-Therapeutic exercises, 97112- Neuromuscular re-education, 97535- Self Care, [id number]-  Manual therapy, [id number]- Gait training, [id number]- Orthotic Initial, Therapeutic exercises, Therapeutic activity, Neuromuscular re-education, Gait  training, and Self Care  PLAN FOR NEXT SESSION: check new 20-30 stocking; helping ankle swelling? Instruct gastroc stretch, balance exs; vector reach, SLS, tandem stance. Instruct HEP. Should only need a visit or 2.   Grayce JINNY Sheldon, PT 11/16/2023, 4:03 PM

## 2023-11-16 NOTE — Telephone Encounter (Signed)
 Stacy Rivas arrived today.  A new form must be completed and medical records sent by November 23, 2023 if patient unable to return to work June 20th. She is unable to return to work.  Not receiving chemotherapy, seeing palliative care but I am not giving up. Form faxed to [facility] [employer] with signed HIPAA Authorization.

## 2023-11-17 NOTE — Telephone Encounter (Signed)
 Another short-term disability packet has been uploaded to the s drive.

## 2023-11-21 ENCOUNTER — Telehealth: Payer: Self-pay | Admitting: *Deleted

## 2023-11-21 ENCOUNTER — Ambulatory Visit (HOSPITAL_COMMUNITY)
Admission: RE | Admit: 2023-11-21 | Discharge: 2023-11-21 | Disposition: A | Discharge status: Home / Self Care | Source: Ambulatory Visit | Attending: *Deleted | Admitting: *Deleted

## 2023-11-21 DIAGNOSIS — K9409 Other complications of colostomy: Secondary | ICD-10-CM | POA: Insufficient documentation

## 2023-11-21 DIAGNOSIS — K435 Parastomal hernia without obstruction or  gangrene: Secondary | ICD-10-CM | POA: Diagnosis not present

## 2023-11-21 DIAGNOSIS — K9400 Colostomy complication, unspecified: Secondary | ICD-10-CM

## 2023-11-21 NOTE — Telephone Encounter (Signed)
 Completed [employer] [place]  [employer] paperwork   Sent to provider to review, amend, sign and return to this nurse to return to claims benefit manager.  Nena LITTIE Louder delivered form to this nurse after being notified request has to be renewed.

## 2023-11-21 NOTE — Progress Notes (Signed)
 [facility]   Reason for visit:  LLQ colostomy with parastomal hernia  Stoma prolapse, stable HPI:  Peritoneal carcinamatosis Past Medical History:  Diagnosis Date   Anxiety state, unspecified    Diverticulosis    Esophageal reflux    Family history of breast cancer    Family history of lymphoma    Family history of stomach cancer    Family history of uterine cancer    Fibroid tumor    Headache    due to allergies    Hiatal hernia    Hyperlipidemia    IBS (irritable bowel syndrome)    Internal hemorrhoids    Nonspecific elevation of levels of transaminase or lactic acid dehydrogenase (LDH)    ovarian ca dx'd 11/2018   Renal stone 07/2013   Tubulovillous adenoma of colon    Varicose veins    superficail thrombophlebitis (left LE)   Family History  Problem Relation Age of Onset   Lymphoma Father        dx 4s   Heart disease Father    Hypertension Father    Hypertension Mother    Hyperlipidemia Mother    Breast cancer Mother 59   Diabetes Maternal Grandmother    Uterine cancer Maternal Grandmother        dx 62s   Stomach cancer Paternal Grandfather    Breast cancer Other        grandmother's sisters, both dx 81s   Colon cancer Neg Hx    Allergies  Allergen Reactions   Carboplatin  Other (See Comments)    Chest tightness/burning, flushing.  See Progress Note from 12/29/2022.   Skin Adhesives [Cyanoacrylate] Hives   Shrimp [Shellfish Allergy] Itching   Sulfasalazine Nausea And Vomiting   Sulfonamide Derivatives Nausea Only   Telithromycin     Other Reaction(s): severe dizziness   Current Outpatient Medications  Medication Sig Dispense Refill Last Dose/Taking   acetaminophen  (TYLENOL ) 500 MG tablet Take 500 mg by mouth every 6 (six) hours as needed for mild pain (pain score 1-3) or moderate pain (pain score 4-6).      ALPRAZolam  (XANAX ) 0.25 MG tablet Take 1 tablet (0.25 mg total) by mouth 2 (two) times daily as needed for anxiety or sleep. 60 tablet 0     apixaban  (ELIQUIS ) 5 MG TABS tablet Take 1 tablet (5 mg total) by mouth 2 (two) times daily. 60 tablet 3    Cholecalciferol (VITAMIN D3) 25 MCG (1000 UT) CAPS Take 1,000 Units by mouth daily.       esomeprazole (NEXIUM) 20 MG capsule Take 20 mg by mouth daily.       estradiol  (ESTRACE  VAGINAL) 0.1 MG/GM vaginal cream Place 1 applicatorful vaginally at bedtime as directed 42.5 g 12    famotidine  (PEPCID ) 20 MG tablet Take 20 mg by mouth as needed for heartburn or indigestion.      lidocaine -prilocaine  (EMLA ) cream Apply topically as needed. 30 g 1    magnesium  oxide (MAG-OX) 400 (240 Mg) MG tablet Take 1 tablet (400 mg total) by mouth daily. 30 tablet 1    Multiple Vitamins-Minerals (MULTI FOR HER PO) Take 1 tablet by mouth daily.      ondansetron  (ZOFRAN -ODT) 4 MG disintegrating tablet Take 1 tablet (4 mg total) by mouth every 8 (eight) hours as needed for nausea or vomiting. 20 tablet 0    oxyCODONE  (OXY IR/ROXICODONE ) 5 MG immediate release tablet Take 1 tablet (5 mg total) by mouth every 6 (six) hours as needed  for severe pain (pain score 7-10) or moderate pain (pain score 4-6). 90 tablet 0    polyethylene glycol (MIRALAX  / GLYCOLAX ) 17 g packet Take 17 g by mouth 2 (two) times daily. (Patient not taking: Reported on 11/09/2023) 14 each 0    prochlorperazine  (COMPAZINE ) 10 MG tablet Take 1 tablet by mouth every 6 hours as needed (Nausea or vomiting). 30 tablet 1    No current facility-administered medications for this encounter.   ROS  Review of Systems Vital signs:  BP 131/69 (BP Location: Right Arm)   Pulse 87   Resp 20   SpO2 97%  Exam:  Physical Exam  Stoma type/location:  LLQ colostomy with stoma prolapse Stomal assessment/size:  oval 2.4 cm x 3 cm  Peristomal assessment:  *** Treatment options for stomal/peristomal skin: *** Output: *** Ostomy pouching: 1pc./2pc.  Education provided:  ***    Impression/dx  *** Discussion  *** Plan  ***    Visit time: *** minutes.    Darice Cooley FNP-BC

## 2023-11-23 ENCOUNTER — Ambulatory Visit

## 2023-11-23 NOTE — Telephone Encounter (Signed)
 FMLA form completed.

## 2023-11-28 ENCOUNTER — Other Ambulatory Visit (HOSPITAL_COMMUNITY): Payer: Self-pay | Admitting: Nurse Practitioner

## 2023-11-28 ENCOUNTER — Ambulatory Visit: Attending: Nurse Practitioner

## 2023-11-28 ENCOUNTER — Ambulatory Visit (HOSPITAL_COMMUNITY)
Admission: RE | Admit: 2023-11-28 | Discharge: 2023-11-28 | Disposition: A | Discharge status: Home / Self Care | Source: Ambulatory Visit | Attending: *Deleted | Admitting: *Deleted

## 2023-11-28 DIAGNOSIS — K9400 Colostomy complication, unspecified: Secondary | ICD-10-CM

## 2023-11-28 DIAGNOSIS — Z515 Encounter for palliative care: Secondary | ICD-10-CM | POA: Diagnosis present

## 2023-11-28 DIAGNOSIS — I890 Lymphedema, not elsewhere classified: Secondary | ICD-10-CM | POA: Diagnosis present

## 2023-11-28 DIAGNOSIS — Z433 Encounter for attention to colostomy: Secondary | ICD-10-CM | POA: Diagnosis present

## 2023-11-28 DIAGNOSIS — K435 Parastomal hernia without obstruction or  gangrene: Secondary | ICD-10-CM | POA: Diagnosis not present

## 2023-11-28 DIAGNOSIS — R2689 Other abnormalities of gait and mobility: Secondary | ICD-10-CM | POA: Diagnosis present

## 2023-11-28 DIAGNOSIS — R530 Neoplastic (malignant) related fatigue: Secondary | ICD-10-CM | POA: Diagnosis present

## 2023-11-28 DIAGNOSIS — L24B3 Irritant contact dermatitis related to fecal or urinary stoma or fistula: Secondary | ICD-10-CM | POA: Diagnosis not present

## 2023-11-28 DIAGNOSIS — C7960 Secondary malignant neoplasm of unspecified ovary: Secondary | ICD-10-CM | POA: Insufficient documentation

## 2023-11-28 DIAGNOSIS — K4020 Bilateral inguinal hernia, without obstruction or gangrene, not specified as recurrent: Secondary | ICD-10-CM

## 2023-11-28 NOTE — Discharge Instructions (Signed)
 4 inch flat pouch (coloplast)

## 2023-11-28 NOTE — Discharge Instructions (Signed)
 1 piece flat 2 1/8 Coloplast ITEM # [id number]

## 2023-11-28 NOTE — Progress Notes (Signed)
 [facility]   Reason for visit:  LLQ colostomy with parastomal hernia  HPI:  Peritoneal carcinomatosis Past Medical History:  Diagnosis Date   Anxiety state, unspecified    Diverticulosis    Esophageal reflux    Family history of breast cancer    Family history of lymphoma    Family history of stomach cancer    Family history of uterine cancer    Fibroid tumor    Headache    due to allergies    Hiatal hernia    Hyperlipidemia    IBS (irritable bowel syndrome)    Internal hemorrhoids    Nonspecific elevation of levels of transaminase or lactic acid dehydrogenase (LDH)    ovarian ca dx'd 11/2018   Renal stone 07/2013   Tubulovillous adenoma of colon    Varicose veins    superficail thrombophlebitis (left LE)   Family History  Problem Relation Age of Onset   Lymphoma Father        dx 24s   Heart disease Father    Hypertension Father    Hypertension Mother    Hyperlipidemia Mother    Breast cancer Mother 41   Diabetes Maternal Grandmother    Uterine cancer Maternal Grandmother        dx 92s   Stomach cancer Paternal Grandfather    Breast cancer Other        grandmother's sisters, both dx 26s   Colon cancer Neg Hx    Allergies  Allergen Reactions   Carboplatin  Other (See Comments)    Chest tightness/burning, flushing.  See Progress Note from 12/29/2022.   Skin Adhesives [Cyanoacrylate] Hives   Shrimp [Shellfish Allergy] Itching   Sulfasalazine Nausea And Vomiting   Sulfonamide Derivatives Nausea Only   Telithromycin     Other Reaction(s): severe dizziness   Current Outpatient Medications  Medication Sig Dispense Refill Last Dose/Taking   acetaminophen  (TYLENOL ) 500 MG tablet Take 500 mg by mouth every 6 (six) hours as needed for mild pain (pain score 1-3) or moderate pain (pain score 4-6).      ALPRAZolam  (XANAX ) 0.25 MG tablet Take 1 tablet (0.25 mg total) by mouth 2 (two) times daily as needed for anxiety or sleep. 60 tablet 0    apixaban  (ELIQUIS )  5 MG TABS tablet Take 1 tablet (5 mg total) by mouth 2 (two) times daily. 60 tablet 3    Cholecalciferol (VITAMIN D3) 25 MCG (1000 UT) CAPS Take 1,000 Units by mouth daily.       esomeprazole (NEXIUM) 20 MG capsule Take 20 mg by mouth daily.       estradiol  (ESTRACE  VAGINAL) 0.1 MG/GM vaginal cream Place 1 applicatorful vaginally at bedtime as directed 42.5 g 12    famotidine  (PEPCID ) 20 MG tablet Take 20 mg by mouth as needed for heartburn or indigestion.      lidocaine -prilocaine  (EMLA ) cream Apply topically as needed. 30 g 1    magnesium  oxide (MAG-OX) 400 (240 Mg) MG tablet Take 1 tablet (400 mg total) by mouth daily. 30 tablet 1    Multiple Vitamins-Minerals (MULTI FOR HER PO) Take 1 tablet by mouth daily.      ondansetron  (ZOFRAN -ODT) 4 MG disintegrating tablet Take 1 tablet (4 mg total) by mouth every 8 (eight) hours as needed for nausea or vomiting. 20 tablet 0    oxyCODONE  (OXY IR/ROXICODONE ) 5 MG immediate release tablet Take 1 tablet (5 mg total) by mouth every 6 (six) hours as needed for severe pain (  pain score 7-10) or moderate pain (pain score 4-6). 90 tablet 0    polyethylene glycol (MIRALAX  / GLYCOLAX ) 17 g packet Take 17 g by mouth 2 (two) times daily. (Patient not taking: Reported on 11/09/2023) 14 each 0    prochlorperazine  (COMPAZINE ) 10 MG tablet Take 1 tablet by mouth every 6 hours as needed (Nausea or vomiting). 30 tablet 1    No current facility-administered medications for this encounter.   ROS  Review of Systems  Constitutional:  Positive for fatigue.  Cardiovascular:  Positive for leg swelling (getting lymphatic drainage.).  Gastrointestinal:        LLQ colostomy Parastomal hernia  Musculoskeletal: Negative.   Skin:  Positive for color change (peristomal redness, intact).  All other systems reviewed and are negative.  Vital signs:  BP 117/60 (BP Location: Right Arm)   Pulse 83   Temp 98.1 F (36.7 C) (Oral)   Resp 18   SpO2 98%  Exam:  Physical Exam Vitals  reviewed.  Constitutional:      Appearance: Normal appearance.  Cardiovascular:     Rate and Rhythm: Normal rate.  Pulmonary:     Breath sounds: Normal breath sounds.  Abdominal:     Palpations: Abdomen is soft.     Hernia: A hernia is present.  Skin:    General: Skin is warm.     Findings: Erythema present.  Neurological:     Mental Status: She is alert and oriented to person, place, and time. Mental status is at baseline.  Psychiatric:        Mood and Affect: Mood normal.     Stoma type/location:  LLQ colostomy Stomal assessment/size:  oval 1.5 cm x 2.0 cm flush pink and moist  Peristomal assessment:  parastomal bulging Nonintact wound at 12 o'clock has healed.  Pink and intact.  Protected with barrier ring only.  Treatment options for stomal/peristomal skin: stoma powder and skin prep  barrier ring and 2 3/4 pouch Output: soft brown stool  Ostomy pouching: 1pc. 2 3/4  inch flat pouch  Education provided:  We are sizing down today due to stomal shrinkage.  THis will take up less room on her abdomen and may be more comfortable.     Impression/dx  Colostomy Parastomal hernia   Discussion  We discuss sizing down pouch today. She may wish to switch between the two .  Will send this order to [facility]  See back as needed.     Visit time: 40 minutes.   Darice Cooley FNP-BC

## 2023-11-28 NOTE — Therapy (Signed)
 OUTPATIENT PHYSICAL THERAPY  LOWER EXTREMITY ONCOLOGY EVALUATION  Patient Name: Stacy Rivas MRN: [id number] DOB:06/13/1955, 68 y.o., female Today's Date: 11/28/2023  END OF SESSION:  PT End of Session - 11/28/23 0757     Visit Number 3    Number of Visits 9    Date for PT Re-Evaluation 02/06/24    PT Start Time 0800    PT Stop Time 0905    PT Time Calculation (min) 65 min    Activity Tolerance Patient tolerated treatment well    Behavior During Therapy [facility] for tasks assessed/performed          Past Medical History:  Diagnosis Date   Anxiety state, unspecified    Diverticulosis    Esophageal reflux    Family history of breast cancer    Family history of lymphoma    Family history of stomach cancer    Family history of uterine cancer    Fibroid tumor    Headache    due to allergies    Hiatal hernia    Hyperlipidemia    IBS (irritable bowel syndrome)    Internal hemorrhoids    Nonspecific elevation of levels of transaminase or lactic acid dehydrogenase (LDH)    ovarian ca dx'd 11/2018   Renal stone 07/2013   Tubulovillous adenoma of colon    Varicose veins    superficail thrombophlebitis (left LE)   Past Surgical History:  Procedure Laterality Date   CATARACT EXTRACTION, BILATERAL     cystoscopy with instillation for cystitis  2001   DEBULKING N/A 02/12/2019   Procedure: DEBULKING OF TUMOR;  Surgeon: Eloy Herring, MD;  Location: [facility];  Service: Gynecology;  Laterality: N/A;   DILATION AND CURETTAGE OF UTERUS     miscarriage   HYSTERECTOMY ABDOMINAL WITH SALPINGO-OOPHORECTOMY Bilateral 02/12/2019   Procedure: HYSTERECTOMY ABDOMINAL WITH SALPINGO-OOPHORECTOMY;  Surgeon: Eloy Herring, MD;  Location: [facility];  Service: Gynecology;  Laterality: Bilateral;   IR IMAGING GUIDED PORT INSERTION  12/06/2018   IR IMAGING GUIDED PORT INSERTION  10/10/2019   IR PARACENTESIS  12/03/2018   IR PARACENTESIS  12/14/2018   IR REMOVAL TUN ACCESS W/ PORT W/O FL MOD SED  06/03/2019    LAPAROSCOPY     adhesions   LAPAROTOMY N/A 07/19/2023   Procedure: EXPLORATORY LAPAROTOMY, DIVERTING LOOP COLOSTOMY;  Surgeon: Curvin Deward MOULD, MD;  Location: [facility];  Service: General;  Laterality: N/A;   LITHOTRIPSY  07/2013   x 2   MYOMECTOMY  1992   OMENTECTOMY N/A 02/12/2019   Procedure: OMENTECTOMY;  Surgeon: Eloy Herring, MD;  Location: [facility];  Service: Gynecology;  Laterality: N/A;   PARACENTESIS  11/21/2018   abdominal , removed 3.1 Liters    Patient Active Problem List   Diagnosis Date Noted   Intestinal stoma prolapse (HCC) 11/15/2023   Parastomal hernia without obstruction or gangrene 10/19/2023   Bilateral hydronephrosis 09/28/2023   Bilateral inguinal hernia without obstruction or gangrene 09/27/2023   Peristomal dermatitis 09/19/2023   Hypomagnesemia 09/14/2023   Atrophic vaginitis 08/24/2023   Acute deep vein thrombosis (DVT) of right lower extremity (HCC) 08/18/2023   UTI (urinary tract infection) 08/16/2023   Cancer associated pain 08/03/2023   Irritant contact dermatitis associated with fecal stoma 08/03/2023   Colostomy complication (HCC) 08/03/2023   Colostomy care (HCC) 07/22/2023   Malnutrition of moderate degree 07/19/2023   Hypokalemia 07/17/2023   Large bowel obstruction (HCC) 07/16/2023   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   Other constipation  07/11/2023   Hand foot syndrome 05/25/2023   COVID-19 virus infection 04/01/2022   Sinus infection 03/08/2022   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 02/01/2022   Nasal sore 01/20/2022   Pancytopenia, acquired (HCC) 01/18/2022   Essential hypertension 01/17/2022   Leukopenia due to antineoplastic chemotherapy (HCC) 10/21/2021   Abdominal pain 09/28/2021   Pelvic floor dysfunction in female 04/05/2021   Leg cramps 11/02/2020   Bilateral bunions 09/08/2020   Mucositis due to antineoplastic therapy 08/04/2020   Anorexia 08/04/2020   Chemotherapy-induced fatigue 05/04/2020   Foot pain, bilateral  05/04/2020   Chemotherapy-induced nausea 03/09/2020   Vaginal dryness, menopausal 02/10/2020   Thrombocytopenia (HCC) 10/22/2019   Skin rash 10/14/2019   Foreign body reaction of the skin 07/02/2019   Genetic testing 04/25/2019   Hyperglycemia, drug-induced 04/01/2019   Elevated liver enzymes 04/01/2019   Family history of breast cancer    Family history of uterine cancer    Family history of lymphoma    Family history of stomach cancer    Preventive measure 03/05/2019   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   Anemia due to antineoplastic chemotherapy 01/17/2019   Peritoneal carcinomatosis (HCC) 11/30/2018   Goals of care, counseling/discussion 11/30/2018   Left ovarian epithelial cancer (HCC) 11/29/2018   Uterine leiomyoma 02/28/2017   Vitreous syneresis, bilateral 01/30/2017   Hyperopia with astigmatism and presbyopia, bilateral 01/30/2017   Dry eyes, bilateral 01/30/2017   Dermatochalasis of eyelids of both eyes 01/30/2017   Posterior capsular opacification of both eyes, obscuring vision 01/30/2017   FLATULENCE-GAS-BLOATING 05/29/2008   ABDOMINAL PAIN RIGHT UPPER QUADRANT 05/29/2008   ABNORMAL TRANSAMINASE-LFT'S 05/29/2008   Anxiety state 05/28/2008   GERD 05/28/2008   HIATAL HERNIA 05/28/2008   IBS 05/28/2008   Hiatal hernia 05/28/2008    PCP: Charlie Love, MD  REFERRING PROVIDER: [place] Roys, NP  REFERRING DIAG:  Z51.5 (ICD-10-CM) - Palliative care patient  R53.0 (ICD-10-CM) - Neoplastic malignant related fatigue  C79.60 (ICD-10-CM) - 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 The side to side movement of my ankle is still a little stiff.  My balance is still off. The balance exercises are still hard, but the others are doing fine. Are there any massages I can do for the swelling?  EVAL Pts right leg swelled from a DVT after her ostomy surgery on 07/19/2023  She has been on Elliquis for 3 months. Her leg has reduced considerably. She has been wearing a Thigh high compression stocking for about 2 months.  She feels she is lacking mobility in her right ankle. She gets some intermittent pins and needles in the anterior ankle, with some discomfort, stiffness and limited mobility. Swelling increases in the daytime and reduces at night. She does not think her thigh has really swollen any more. Pt is under palliative care because there is no further chemo for her, however, she wants to be the best that she can. She does note that she has mild neuropathy in her feet.   PERTINENT HISTORY:   68 y.o. 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 Palliative is seeing patient for symptom management and goals of care.  She had colostomy placed 07/19/2023 PAIN:  Are you having pain? No  PRECAUTIONS: Right LE swelling from DVT(on Elliquis),Peritoneal carcinomatosis,  RED FLAGS: Bowel or bladder incontinence: Yes: has ostomy pouch   WEIGHT BEARING RESTRICTIONS: No  FALLS:  Has patient fallen in last 6 months? No  LIVING ENVIRONMENT: Lives with: [facility] Lives in: House/apartment   OCCUPATION: on Medical leave from sales  LEISURE: read, walker but not since blood clot.  PRIOR LEVEL OF FUNCTION: Independent  PATIENT GOALS: improve mobility right  ankle,check swelling   OBJECTIVE: Note: Objective measures were completed at Evaluation unless otherwise noted.  COGNITION: Overall cognitive status: Within functional limits for tasks assessed   PALPATION: Mild pitting right foot and ankle, mild warmth right foot/ankle but has been in stocking  OBSERVATIONS / OTHER ASSESSMENTS: increased edema noted right foot and ankle, thigh despite wearing compression garment. Mild rubor right foot/toes greater than left but with good capillary refill. Does not appear infected  SENSATION: Light touch: Deficits      POSTURE: WFL  LOWER EXTREMITY STRENGTH:  ROM Right eval  Hip flexion   Hip extension   Hip abduction   Hip adduction   Hip internal rotation   Hip external rotation   Knee flexion   Knee extension   Ankle dorsiflexion 2  Ankle plantarflexion 50  Ankle inversion 24  Ankle eversion 12  Great toe extension    (Blank rows = not tested)  ROM LEFT eval  Hip flexion   Hip extension   Hip abduction   Hip adduction   Hip internal rotation   Hip external rotation   Knee flexion   Knee extension   Ankle dorsiflexion 5  Ankle plantarflexion 60  Ankle inversion 25  Ankle eversion 12  Great toe extension     (Blank rows = not tested)  Bilateral Ankle Strength:5/5   LYMPHEDEMA ASSESSMENTS:   SURGERY TYPE/DATE: Abdominal hysterectomy 02/12/2019, Colostomy 09/26/2023  NUMBER OF LYMPH NODES REMOVED: not aware of any  CHEMOTHERAPY: neoadjuvant  RADIATION:NO  HORMONE TREATMENT: NO  INFECTIONS: NO  LYMPHEDEMA ASSESSMENTS:   LOWER EXTREMITY LANDMARK RIGHT eval RIGHT 11/16/2023 RIGHT 11/28/2023  At groin     30 cm proximal to suprapatella     20 cm proximal to suprapatella 55 53 52.8  10 cm proximal to suprapatella 45 45 44.5  At midpatella / popliteal  crease 36  34.5  30 cm proximal to floor at lateral plantar foot 35.1  34.1  20 cm proximal to floor at lateral plantar foot 25.8 25.3 24.9  10 cm proximal to  floor at lateral plantar foot 20.5 19.6 19.4  Circumference of ankle/heel 29  27.8  5 cm proximal to 1st MTP joint     Across MTP joint 20.3    Around proximal great toe 7.4  7.1  (Blank rows = not tested)  LOWER EXTREMITY LANDMARK LEFT eval  At groin   30 cm proximal to suprapatella   20 cm proximal to suprapatella 53  10 cm proximal to suprapatella 42.8  At midpatella / popliteal crease 35.6  30 cm proximal to floor at lateral plantar foot 34.4  20 cm proximal to floor at lateral plantar foot 25.4  10 cm proximal to floor at lateral plantar foot 19.7  Circumference of ankle/heel 28.3  5 cm proximal to 1st MTP joint   Across MTP joint 19.7  Around proximal great toe 6.9  (Blank rows = not tested)  FUNCTIONAL TESTS:  Balance screen; difficulty with tandem stance (less than 10 seconds with Right foot forward, SLS limited B to a few seconds  GAIT:Near normal. Mild decreased heel strike toe off                                                                                                                                TREATMENT DATE:   11/28/2023 Pt circumferences measured with good decrease in edema noted especially in ankle and around achilles, and lower leg doing very well. Right thigh and lateral hip with greatest edema. Discussed compression shorts as an option, but pt may have difficulty due to Colostomy. Educated pt in role of MLD with swelling, superficial nature, importance of stretch. therapist performed  MLD first ;short neck, Right axillary and inguinal LN's, Right inguino-axillary pathway, Right outer thigh, medial thigh to lateral, and lateral thigh again repeating pathway, then right Lower leg both sides and foot, retracing pathways and ending with LN's. Pt instructed in all techniques and did very well overall with tactile cues for stretch and pressure, and VC's prn. Pt had a good understanding of sequence. Gave pt written handout.   11/16/2023 Checked stocking and  remeasured leg in several places; good reduction in several places with stronger stocking. Foot aspect of stocking feels a little loose, but pt will wash and try to renew the stretch there. Incline stretch x 3 runners stretch x 2 ea 30 sec holds HS stretch with strap 3 x 30 each Half tandem and full tandem stance B x 2 ea to failure Vector reaches B 2 sets of 5 with foot touch, 2 sets of 5 no touch between Supine clam red x 15  Bridging x 10 Updated HEP with pictures 619/2025 Measured circumference of bilateral LE's and ROM/strength of Bilateral ankles. Pt noted to have mild increased edema throughout  Right LE but most limiting for her in ankle ROM for DR and PF. She has good capillary refill, but noted rubor right foot greater than left with mild pitting edema at the ankle. She is wearing a 15-20 mm thigh high compression stocking. We discussed several options; a 20-30 compression stocking to try and prevent ankle swelling so she would have improved ROM, or a knee high stocking over her present stocking to increase compression at the foot and ankle. She is going to return to [facility] to see about getting a slightly stronger stocking.  She has also been encouraged to elevate her legs as much as possible when sitting.She does have mild neuropathy which may be affecting her balance. She will benefit from education in foot and ankle ROM, gastroc stretching, and balance activities for a HEP. We have set up 1 appt per week for 2 weeks.    PATIENT EDUCATION:  Access Code: [email or web address] URL: [email or web address] Date: 11/16/2023 Prepared by: Grayce Sheldon  Exercises - Supine Bridge  - 1 x daily - 7 x weekly - 1 sets - 10 reps - 5 hold - Hooklying Clamshell with Resistance  - 1 x daily - 7 x weekly - 1 sets - 10-15 reps - 3 hold - Supine Hamstring Stretch with Strap  - 1 x daily - 7 x weekly - 1 sets - 3 reps - 30 hold - Gastroc Stretch on Wall  - 1 x daily - 7 x weekly - 1 sets - 3 reps  - 30 hold - Long Sitting Calf Stretch with Strap  - 1 x daily - 7 x weekly - 1 sets - 3 reps - 30 hold - Standing Gastroc Stretch on Step with Counter Support  - 1 x daily - 7 x weekly - 1 sets - 3 reps - 30 hold - Single Leg Balance  - 1 x daily - 7 x weekly - 1 sets - 5 reps    Education details: see 11/08/2023 Person educated: Patient Education method: Explanation Education comprehension: verbalized understanding  HOME EXERCISE PROGRAM:   ASSESSMENT:  CLINICAL IMPRESSION: Altered treatment plan today at pt request due to interest in learning MLD for right LE swelling. Good results with right LE circumference measurements however, right thigh is still very swollen especially at lateral hip. Discussed compression shorts but due to heat, and colostomy may be difficult. Pt did well with initial instruction today but will need review. She would also like more time to work on her exercises before returning. Pt to return in 3 weeks and we will review MLD and progress HEP prn.   EVAL Patient is a 68 y.o. female who was seen today for physical therapy evaluation and treatment for complaints of right ankle swelling and limitations in ROM since she had a DVT several months ago.. Pt noted to have mild increased edema throughout Right LE but most limiting for her in ankle ROM for DR and PF. She has good capillary refill, but noted rubor right foot greater than left with mild pitting edema at the ankle. She is wearing a 15-20 mm thigh high compression stocking. We discussed several options; a 20-30 compression stocking to try and prevent ankle swelling so she would have improved ROM, or a knee high stocking over her present stocking to increase compression at the foot and ankle. She is going to return to [facility] to see about getting a slightly stronger stocking.  She has also been encouraged to elevate her legs as  much as possible when sitting.She does have mild neuropathy which may be affecting her  balance. She will benefit from education in foot and ankle ROM, gastroc stretching, and balance activities for a HEP. We have set up 1 appt per week for 2 weeks.   OBJECTIVE IMPAIRMENTS: decreased balance, decreased knowledge of condition, decreased mobility, and increased edema.   ACTIVITY LIMITATIONS: sitting, standing, and locomotion level  PARTICIPATION LIMITATIONS: pt is able to participate in all but with increased swelling  PERSONAL FACTORS: 3+ comorbidities: left ovarian cancer with peritoneal carcinomatosis with no further chemo available are also affecting patient's functional outcome.   REHAB POTENTIAL: Good  CLINICAL DECISION MAKING: Evolving/moderate complexity  EVALUATION COMPLEXITY: Moderate   GOALS: Goals reviewed with patient? Yes  SHORT TERM GOALS=LONG TERM GOALS: Target date: 12/07/2023  Pt will have decreased right ankle swelling/ limitations with 20-30 compression garment Baseline: Goal status: MET 11/28/2023 2.  Pt will be independent with a HEP for ankle ROM and balance Baseline:  Goal status: In PROGRESS  3. Pt will be independent with MLD to the right LE to decrease swelling  Baseline: no knowledge  NEW PLAN:  PT FREQUENCY: 1x/weekprn for 4- 6 visits prn ( to return on 7/31) to give pt time to practice  PT DURATION: 10 weeks  PLANNED INTERVENTIONS: 97164- PT Re-evaluation, 97110-Therapeutic exercises, 02887- Neuromuscular re-education, 97535- Self Care, 02859- Manual therapy, 97116- Gait training, [phone]- Orthotic Initial, Therapeutic exercises, Therapeutic activity, Neuromuscular re-education, Gait training, and Self Care  PLAN FOR NEXT SESSION: check new 20-30 stocking; helping ankle swelling? Instruct gastroc stretch, balance exs; vector reach, SLS, tandem stance. Instruct HEP. Should only need a visit or 2.   Grayce JINNY Sheldon, PT 11/28/2023, 9:30 AM

## 2023-12-05 ENCOUNTER — Encounter: Payer: Self-pay | Admitting: Nurse Practitioner

## 2023-12-05 ENCOUNTER — Ambulatory Visit (HOSPITAL_COMMUNITY)
Admission: RE | Admit: 2023-12-05 | Discharge: 2023-12-05 | Disposition: A | Discharge status: Home / Self Care | Source: Ambulatory Visit | Attending: Nurse Practitioner | Admitting: Nurse Practitioner

## 2023-12-05 ENCOUNTER — Ambulatory Visit: Attending: Gynecologic Oncology | Admitting: Nurse Practitioner

## 2023-12-05 DIAGNOSIS — K9400 Colostomy complication, unspecified: Secondary | ICD-10-CM | POA: Diagnosis not present

## 2023-12-05 DIAGNOSIS — C786 Secondary malignant neoplasm of retroperitoneum and peritoneum: Secondary | ICD-10-CM

## 2023-12-05 DIAGNOSIS — K9409 Other complications of colostomy: Secondary | ICD-10-CM | POA: Diagnosis present

## 2023-12-05 DIAGNOSIS — Z7189 Other specified counseling: Secondary | ICD-10-CM

## 2023-12-05 DIAGNOSIS — K435 Parastomal hernia without obstruction or  gangrene: Secondary | ICD-10-CM | POA: Diagnosis not present

## 2023-12-05 DIAGNOSIS — Z515 Encounter for palliative care: Secondary | ICD-10-CM | POA: Diagnosis not present

## 2023-12-05 DIAGNOSIS — R530 Neoplastic (malignant) related fatigue: Secondary | ICD-10-CM | POA: Diagnosis not present

## 2023-12-05 DIAGNOSIS — C7960 Secondary malignant neoplasm of unspecified ovary: Secondary | ICD-10-CM

## 2023-12-05 NOTE — Progress Notes (Signed)
 [facility]   Reason for visit:  LLQ colostomy with parastomal hernia HPI:   Past Medical History:  Diagnosis Date   Anxiety state, unspecified    Diverticulosis    Esophageal reflux    Family history of breast cancer    Family history of lymphoma    Family history of stomach cancer    Family history of uterine cancer    Fibroid tumor    Headache    due to allergies    Hiatal hernia    Hyperlipidemia    IBS (irritable bowel syndrome)    Internal hemorrhoids    Nonspecific elevation of levels of transaminase or lactic acid dehydrogenase (LDH)    ovarian ca dx'd 11/2018   Renal stone 07/2013   Tubulovillous adenoma of colon    Varicose veins    superficail thrombophlebitis (left LE)   Family History  Problem Relation Age of Onset   Lymphoma Father        dx 79s   Heart disease Father    Hypertension Father    Hypertension Mother    Hyperlipidemia Mother    Breast cancer Mother 29   Diabetes Maternal Grandmother    Uterine cancer Maternal Grandmother        dx 46s   Stomach cancer Paternal Grandfather    Breast cancer Other        grandmother's sisters, both dx 29s   Colon cancer Neg Hx    Allergies  Allergen Reactions   Carboplatin  Other (See Comments)    Chest tightness/burning, flushing.  See Progress Note from 12/29/2022.   Skin Adhesives [Cyanoacrylate] Hives   Shrimp [Shellfish Allergy] Itching   Sulfasalazine Nausea And Vomiting   Sulfonamide Derivatives Nausea Only   Telithromycin     Other Reaction(s): severe dizziness   Current Outpatient Medications  Medication Sig Dispense Refill Last Dose/Taking   acetaminophen  (TYLENOL ) 500 MG tablet Take 500 mg by mouth every 6 (six) hours as needed for mild pain (pain score 1-3) or moderate pain (pain score 4-6).      ALPRAZolam  (XANAX ) 0.25 MG tablet Take 1 tablet (0.25 mg total) by mouth 2 (two) times daily as needed for anxiety or sleep. 60 tablet 0    apixaban  (ELIQUIS ) 5 MG TABS tablet Take 1  tablet (5 mg total) by mouth 2 (two) times daily. 60 tablet 3    Cholecalciferol (VITAMIN D3) 25 MCG (1000 UT) CAPS Take 1,000 Units by mouth daily.       esomeprazole (NEXIUM) 20 MG capsule Take 20 mg by mouth daily.       estradiol  (ESTRACE  VAGINAL) 0.1 MG/GM vaginal cream Place 1 applicatorful vaginally at bedtime as directed 42.5 g 12    famotidine  (PEPCID ) 20 MG tablet Take 20 mg by mouth as needed for heartburn or indigestion.      lidocaine -prilocaine  (EMLA ) cream Apply topically as needed. 30 g 1    magnesium  oxide (MAG-OX) 400 (240 Mg) MG tablet Take 1 tablet (400 mg total) by mouth daily. 30 tablet 1    Multiple Vitamins-Minerals (MULTI FOR HER PO) Take 1 tablet by mouth daily.      ondansetron  (ZOFRAN -ODT) 4 MG disintegrating tablet Take 1 tablet (4 mg total) by mouth every 8 (eight) hours as needed for nausea or vomiting. 20 tablet 0    oxyCODONE  (OXY IR/ROXICODONE ) 5 MG immediate release tablet Take 1 tablet (5 mg total) by mouth every 6 (six) hours as needed for severe pain (pain score  7-10) or moderate pain (pain score 4-6). 90 tablet 0    polyethylene glycol (MIRALAX  / GLYCOLAX ) 17 g packet Take 17 g by mouth 2 (two) times daily. (Patient not taking: Reported on 11/09/2023) 14 each 0    prochlorperazine  (COMPAZINE ) 10 MG tablet Take 1 tablet by mouth every 6 hours as needed (Nausea or vomiting). 30 tablet 1    No current facility-administered medications for this encounter.   ROS  Review of Systems Vital signs:  BP 120/71 (BP Location: Right Arm)   Pulse 80   Temp 98 F (36.7 C) (Oral)   Resp 18   SpO2 97%  Exam:  Physical Exam  Stoma type/location:  LLQ colostomy Stomal assessment/size:  oval 2 3/4  Peristomal assessment:  parastomal hernia.  Bulging creates round pouching surface Treatment options for stomal/peristomal skin: stoma powder skin prep barrier ring.  1 piece flat pouch with barrier strips to perimeter.  Output: soft brown stool Ostomy pouching: 1pc. flat   Education provided:  wears 2 3/4 pouch at times. Still has 4 inch pouch if prolapse or hernia increases in size/changes shape.     Impression/dx  Colostomy complication Discussion  Continue one piece flat pouch  Plan  See back as needed.   Supplies with [facility], no current issues    Visit time: 45 minutes.   Darice Cooley FNP-BC

## 2023-12-05 NOTE — Progress Notes (Signed)
 Palliative Medicine [facility]  [phone] [phone]   Name: Stacy Rivas Date: 12/05/2023 MRN: [id number]  DOB: February 13, 1956  Patient Care Team: Shepard Ade, MD as PCP - General (Internal Medicine) Andra Jovita NOVAK, RN as Registered Nurse Curvin Deward MOULD, MD as Consulting Physician (General Surgery) Lonn Hicks, MD as Consulting Physician (Hematology and Oncology) Eloy Herring, MD (Gynecologic Oncology) Pyrtle, Gordy HERO, MD as Consulting Physician (Gastroenterology) Pickenpack-Cousar, Fannie SAILOR, NP as Nurse Practitioner [facility] and Palliative Medicine)   I connected with Stacy Rivas on 12/05/23 at  3:00 PM EDT by telephone and verified that I am speaking with the correct person using two identifiers.   I discussed the limitations, risks, security and privacy concerns of performing an evaluation and management service by telemedicine and the availability of in-person appointments. I also discussed with the patient that there may be a patient responsible charge related to this service. The patient expressed understanding and agreed to proceed.   Other persons participating in the visit and their role in the encounter: N/A   Patient's location: Home   Provider's location: [facility]   INTERVAL HISTORY: Stacy Rivas is a 68 y.o. 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 Palliative is seeing patient for symptom management and goals of care.   SOCIAL HISTORY:     reports that she quit smoking about 43 years ago. Her smoking use included cigarettes. She started smoking about 53 years ago. She has never used smokeless tobacco. She reports current alcohol  use. She reports that she does not use drugs.  ADVANCE DIRECTIVES:  On file. Patient identifies husband Jayson as Oncologist with support of her sister and niece Elray and [place]). Documents  reviewed.   CODE STATUS: DNR  PAST MEDICAL HISTORY: Past Medical History:  Diagnosis Date   Anxiety state, unspecified    Diverticulosis    Esophageal reflux    Family history of breast cancer    Family history of lymphoma    Family history of stomach cancer    Family history of uterine cancer    Fibroid tumor    Headache    due to allergies    Hiatal hernia    Hyperlipidemia    IBS (irritable bowel syndrome)    Internal hemorrhoids    Nonspecific elevation of levels of transaminase or lactic acid dehydrogenase (LDH)    ovarian ca dx'd 11/2018   Renal stone 07/2013   Tubulovillous adenoma of colon    Varicose veins    superficail thrombophlebitis (left LE)    ALLERGIES:  is allergic to carboplatin , skin adhesives [cyanoacrylate], shrimp [shellfish allergy], sulfasalazine, sulfonamide derivatives, and telithromycin.  MEDICATIONS:  Current Outpatient Medications  Medication Sig Dispense Refill   acetaminophen  (TYLENOL ) 500 MG tablet Take 500 mg by mouth every 6 (six) hours as needed for mild pain (pain score 1-3) or moderate pain (pain score 4-6).     ALPRAZolam  (XANAX ) 0.25 MG tablet Take 1 tablet (0.25 mg total) by mouth 2 (two) times daily as needed for anxiety or sleep. 60 tablet 0   apixaban  (ELIQUIS ) 5 MG TABS tablet Take 1 tablet (5 mg total) by mouth 2 (two) times daily. 60 tablet 3   Cholecalciferol (VITAMIN D3) 25 MCG (1000 UT) CAPS Take 1,000 Units by mouth daily.      esomeprazole (NEXIUM) 20 MG capsule Take 20 mg by mouth daily.  estradiol  (ESTRACE  VAGINAL) 0.1 MG/GM vaginal cream Place 1 applicatorful vaginally at bedtime as directed 42.5 g 12   famotidine  (PEPCID ) 20 MG tablet Take 20 mg by mouth as needed for heartburn or indigestion.     lidocaine -prilocaine  (EMLA ) cream Apply topically as needed. 30 g 1   magnesium  oxide (MAG-OX) 400 (240 Mg) MG tablet Take 1 tablet (400 mg total) by mouth daily. 30 tablet 1   Multiple Vitamins-Minerals (MULTI FOR HER PO)  Take 1 tablet by mouth daily.     ondansetron  (ZOFRAN -ODT) 4 MG disintegrating tablet Take 1 tablet (4 mg total) by mouth every 8 (eight) hours as needed for nausea or vomiting. 20 tablet 0   oxyCODONE  (OXY IR/ROXICODONE ) 5 MG immediate release tablet Take 1 tablet (5 mg total) by mouth every 6 (six) hours as needed for severe pain (pain score 7-10) or moderate pain (pain score 4-6). 90 tablet 0   polyethylene glycol (MIRALAX  / GLYCOLAX ) 17 g packet Take 17 g by mouth 2 (two) times daily. (Patient not taking: Reported on 11/09/2023) 14 each 0   prochlorperazine  (COMPAZINE ) 10 MG tablet Take 1 tablet by mouth every 6 hours as needed (Nausea or vomiting). 30 tablet 1   No current facility-administered medications for this visit.    VITAL SIGNS: There were no vitals taken for this visit. There were no vitals filed for this visit.  Estimated body mass index is 25.2 kg/m as calculated from the following:   Height as of 10/05/23: 5' 2 (1.575 m).   Weight as of 10/05/23: 137 lb 12.8 oz (62.5 kg).   PERFORMANCE STATUS (ECOG) : 1 - Symptomatic but completely ambulatory  IMPRESSION: Discussed the use of AI scribe software for clinical note transcription with the patient, who gave verbal consent to proceed.  History of Present Illness Stacy Rivas is a 68 year old female who I connected with by phone for symptom management follow-up. No acute distress noted. Denies concerns of nausea, vomiting, constipation, or diarrhea. No pain or discomfort. Occasional fatigue however remains active. Taking things one day at time.   Her potassium levels were noted to be low on June 23rd. She has been consuming foods high in potassium such as bananas, potatoes, and spinach. She is uncertain if her potassium levels have improved. Will continue magnesium  with plans to redraw labs in August   She is tolerating PT without difficulty. Is appreciative of the progress she is making with that.   Inquires about  different alternatives to her care expressing her goals are to continue to focus on a good quality of life allowing her to live and thrive for as long as she can. Taking things one day at a time.   All questions answered and support provided.   Goals of Care  11/07/23: We discussed the patient's current illness and what it means in the larger context of their on-going co-morbidities. Natural disease trajectory and expectations were discussed. Mrs. Arts is realistic in her understanding of incurable cancer. She speaks to awareness of no additional treatment options with a goal of focusing on her comfort.   We discussed goals and philosophy of palliative and hospice. Education provided on ongoing symptom management and hospice referral when appropriate and patient/family feels ready.   She discusses her end-of-life care preferences, expressing a desire not to be resuscitated in a terminal state and preferring inpatient hospice care over dying at home. She wishes to avoid a feeding tube but is open to IV fluids  if dehydrated for a defined trial. Education provided on MOST and wishes have been documented and completed as desired.   The patient and family outlined their wishes for the following treatment decisions:  Cardiopulmonary Resuscitation: Do Not Attempt Resuscitation (DNR/No CPR)  Medical Interventions: Limited Additional Interventions: Use medical treatment, IV fluids and cardiac monitoring as indicated, DO NOT USE intubation or mechanical ventilation. May consider use of less invasive airway support such as BiPAP or CPAP. Also provide comfort measures. Transfer to the hospital if indicated. Avoid intensive care.  If in terminal state or no recovery wishes to focus on comfort. Patient wishes to spend last moments at inpatient hospice unit. No desire to pass away in the home if possible.   Antibiotics: Determine use of limitation of antibiotics when infection occurs  IV Fluids: IV fluids for a  defined trial period  Feeding Tube: No feeding tube    I discussed the importance of continued conversation with family and their medical providers regarding overall plan of care and treatment options, ensuring decisions are within the context of the patients values and GOCs. Assessment & Plan Cancer No active treatment. Under palliative care with focus on symptom management and quality of life.  Dependent edema of left ankle Swelling decreases at night and increases during the day, likely due to prolonged standing or sitting. Patient having stiffness and discomfort limiting mobility.  - Advise use of knee-high compression socks to manage swelling. - Recommend leg elevation or standing every two to three hours if sitting for extended periods. -PT active   Goals of Care Prefers no resuscitation in terminal state or natural passing. Desires hospital evaluation if critically ill but no extensive interventions if terminal. Prefers inpatient hospice at end of life. - Document DNR status and end-of-life care preferences in medical records. - Ensure family awareness of her hospitalization and hospice care wishes. - Facilitate transition to inpatient hospice care when appropriate. -Goals are clear to continue to treat the treatable. Aggressively manage symptoms as needed ensuring best quality of life for as long as she can. Patient and family realistic in their understanding of cancer status and plan of care   Cancer with ineffective chemotherapy Current chemotherapy regimen is ineffective. She is interested alternative methods which is considered a last-resort option and not prescribed by the current practice. - Consider alternative providers if interested in pursuing ivermectin  Hypokalemia Hypokalemia identified on labs from June 23. She is consuming potassium-rich foods to address low potassium levels. - Recheck potassium levels at the end of July or beginning of August  Magnesium   supplementation Currently taking 650 mg of magnesium  daily, reduced from 800 mg due to diarrhea at higher doses. She has started taking magnesium  gummies, which she finds palatable. - Continue current magnesium  supplementation at 650 mg daily until labs are rechecked  I will plant to follow-up with patient in 4-6 weeks. Sooner if needed. Labs and port flush in 2-3 weeks. CBC, CMP, Mg  Patient expressed understanding and was in agreement with this plan. She also understands that She can call the clinic at any time with any questions, concerns, or complaints.   Any controlled substances utilized were prescribed in the context of palliative care. PDMP has been reviewed.   I provided 40 minutes of non face-to-face telephone visit time during this encounter, and > 50% was spent counseling as documented under my assessment & plan. Visit consisted of counseling and education dealing with the complex and emotionally intense issues of symptom management and  palliative care in the setting of serious and potentially life-threatening illness.  Levon Borer, AGPCNP-BC  Palliative Medicine Team/[facility]

## 2023-12-06 ENCOUNTER — Other Ambulatory Visit (HOSPITAL_BASED_OUTPATIENT_CLINIC_OR_DEPARTMENT_OTHER): Payer: Self-pay

## 2023-12-06 ENCOUNTER — Telehealth: Payer: Self-pay | Admitting: Nurse Practitioner

## 2023-12-06 NOTE — Telephone Encounter (Signed)
 Left patient a vm regarding upcoming appointment

## 2023-12-06 NOTE — Telephone Encounter (Signed)
 Spoke with patient confirming upcoming appointment

## 2023-12-06 NOTE — Telephone Encounter (Signed)
 Short-term disability paperwork has been uploaded to the s drive.

## 2023-12-08 ENCOUNTER — Ambulatory Visit (HOSPITAL_COMMUNITY)
Admission: RE | Admit: 2023-12-08 | Discharge: 2023-12-08 | Disposition: A | Discharge status: Home / Self Care | Source: Ambulatory Visit | Attending: Internal Medicine | Admitting: Internal Medicine

## 2023-12-08 DIAGNOSIS — Z433 Encounter for attention to colostomy: Secondary | ICD-10-CM

## 2023-12-08 DIAGNOSIS — K435 Parastomal hernia without obstruction or  gangrene: Secondary | ICD-10-CM

## 2023-12-08 NOTE — Progress Notes (Signed)
 [facility]   Reason for visit:  LLQ colostomy, prolapse with parastomal hernia HPI:  Resection with colostomy  Past Medical History:  Diagnosis Date   Anxiety state, unspecified    Diverticulosis    Esophageal reflux    Family history of breast cancer    Family history of lymphoma    Family history of stomach cancer    Family history of uterine cancer    Fibroid tumor    Headache    due to allergies    Hiatal hernia    Hyperlipidemia    IBS (irritable bowel syndrome)    Internal hemorrhoids    Nonspecific elevation of levels of transaminase or lactic acid dehydrogenase (LDH)    ovarian ca dx'd 11/2018   Renal stone 07/2013   Tubulovillous adenoma of colon    Varicose veins    superficail thrombophlebitis (left LE)   Family History  Problem Relation Age of Onset   Lymphoma Father        dx 77s   Heart disease Father    Hypertension Father    Hypertension Mother    Hyperlipidemia Mother    Breast cancer Mother 80   Diabetes Maternal Grandmother    Uterine cancer Maternal Grandmother        dx 7s   Stomach cancer Paternal Grandfather    Breast cancer Other        grandmother's sisters, both dx 60s   Colon cancer Neg Hx    Allergies  Allergen Reactions   Carboplatin  Other (See Comments)    Chest tightness/burning, flushing.  See Progress Note from 12/29/2022.   Skin Adhesives [Cyanoacrylate] Hives   Shrimp [Shellfish Allergy] Itching   Sulfasalazine Nausea And Vomiting   Sulfonamide Derivatives Nausea Only   Telithromycin     Other Reaction(s): severe dizziness   Current Outpatient Medications  Medication Sig Dispense Refill Last Dose/Taking   acetaminophen  (TYLENOL ) 500 MG tablet Take 500 mg by mouth every 6 (six) hours as needed for mild pain (pain score 1-3) or moderate pain (pain score 4-6).      ALPRAZolam  (XANAX ) 0.25 MG tablet Take 1 tablet (0.25 mg total) by mouth 2 (two) times daily as needed for anxiety or sleep. 60 tablet 0    apixaban   (ELIQUIS ) 5 MG TABS tablet Take 1 tablet (5 mg total) by mouth 2 (two) times daily. 60 tablet 3    Cholecalciferol (VITAMIN D3) 25 MCG (1000 UT) CAPS Take 1,000 Units by mouth daily.       esomeprazole (NEXIUM) 20 MG capsule Take 20 mg by mouth daily.       estradiol  (ESTRACE  VAGINAL) 0.1 MG/GM vaginal cream Place 1 applicatorful vaginally at bedtime as directed 42.5 g 12    famotidine  (PEPCID ) 20 MG tablet Take 20 mg by mouth as needed for heartburn or indigestion.      lidocaine -prilocaine  (EMLA ) cream Apply topically as needed. 30 g 1    magnesium  oxide (MAG-OX) 400 (240 Mg) MG tablet Take 1 tablet (400 mg total) by mouth daily. 30 tablet 1    Multiple Vitamins-Minerals (MULTI FOR HER PO) Take 1 tablet by mouth daily.      ondansetron  (ZOFRAN -ODT) 4 MG disintegrating tablet Take 1 tablet (4 mg total) by mouth every 8 (eight) hours as needed for nausea or vomiting. 20 tablet 0    oxyCODONE  (OXY IR/ROXICODONE ) 5 MG immediate release tablet Take 1 tablet (5 mg total) by mouth every 6 (six) hours as needed for  severe pain (pain score 7-10) or moderate pain (pain score 4-6). 90 tablet 0    polyethylene glycol (MIRALAX  / GLYCOLAX ) 17 g packet Take 17 g by mouth 2 (two) times daily. (Patient not taking: Reported on 11/09/2023) 14 each 0    prochlorperazine  (COMPAZINE ) 10 MG tablet Take 1 tablet by mouth every 6 hours as needed (Nausea or vomiting). 30 tablet 1    No current facility-administered medications for this encounter.   ROS  Review of Systems  Constitutional:  Positive for fatigue.  Gastrointestinal:        LLQ colostomy with parastomal hernia  Psychiatric/Behavioral: Negative.    All other systems reviewed and are negative.  Vital signs:  BP 130/63 (BP Location: Right Arm)   Pulse 72   Temp 98 F (36.7 C) (Oral)   Resp 20   SpO2 99%  Exam:  Physical Exam Vitals reviewed.  Constitutional:      Appearance: Normal appearance.  Cardiovascular:     Rate and Rhythm: Normal rate.      Heart sounds: Normal heart sounds.  Pulmonary:     Effort: Pulmonary effort is normal.     Breath sounds: Normal breath sounds.  Abdominal:     Palpations: Abdomen is soft.     Hernia: A hernia is present.  Musculoskeletal:        General: Normal range of motion.  Skin:    General: Skin is warm and dry.  Neurological:     Mental Status: She is alert and oriented to person, place, and time. Mental status is at baseline.  Psychiatric:        Mood and Affect: Mood normal.        Behavior: Behavior normal.     Stoma type/location:  LLQ colostomy Stomal assessment/size:  oval, flush  Peristomal assessment:  some erythema, uses skin prep and barrier ring  Treatment options for stomal/peristomal skin: 1 piece flat pouch.   Output: soft brown stool  Ostomy pouching: 1pc. Flat  2 3/4 or 4 inch, depending on stomal size Education provided:  continues to try and size down pouch when she can.     Impression/dx  Colostomy Parastomal hernia  Discussion  See back as needed.  Plan  Continue 1 piece flat pouch     Visit time: 45 minutes.   Darice Cooley FNP-BC

## 2023-12-14 ENCOUNTER — Telehealth: Payer: Self-pay

## 2023-12-15 ENCOUNTER — Ambulatory Visit (HOSPITAL_COMMUNITY)
Admission: RE | Admit: 2023-12-15 | Discharge: 2023-12-15 | Disposition: A | Discharge status: Home / Self Care | Source: Ambulatory Visit | Attending: *Deleted | Admitting: *Deleted

## 2023-12-15 DIAGNOSIS — K9409 Other complications of colostomy: Secondary | ICD-10-CM | POA: Insufficient documentation

## 2023-12-15 DIAGNOSIS — Z933 Colostomy status: Secondary | ICD-10-CM | POA: Diagnosis present

## 2023-12-15 DIAGNOSIS — K435 Parastomal hernia without obstruction or  gangrene: Secondary | ICD-10-CM | POA: Diagnosis not present

## 2023-12-15 DIAGNOSIS — K9400 Colostomy complication, unspecified: Secondary | ICD-10-CM | POA: Diagnosis not present

## 2023-12-15 NOTE — Telephone Encounter (Signed)
FMLA done

## 2023-12-15 NOTE — Progress Notes (Signed)
 [facility]   Reason for visit:  LLQ colostomy, prolapsed with parastomal hernia HPI:  Resection with colostomy Past Medical History:  Diagnosis Date   Anxiety state, unspecified    Diverticulosis    Esophageal reflux    Family history of breast cancer    Family history of lymphoma    Family history of stomach cancer    Family history of uterine cancer    Fibroid tumor    Headache    due to allergies    Hiatal hernia    Hyperlipidemia    IBS (irritable bowel syndrome)    Internal hemorrhoids    Nonspecific elevation of levels of transaminase or lactic acid dehydrogenase (LDH)    ovarian ca dx'd 11/2018   Renal stone 07/2013   Tubulovillous adenoma of colon    Varicose veins    superficail thrombophlebitis (left LE)   Family History  Problem Relation Age of Onset   Lymphoma Father        dx 82s   Heart disease Father    Hypertension Father    Hypertension Mother    Hyperlipidemia Mother    Breast cancer Mother 32   Diabetes Maternal Grandmother    Uterine cancer Maternal Grandmother        dx 14s   Stomach cancer Paternal Grandfather    Breast cancer Other        grandmother's sisters, both dx 72s   Colon cancer Neg Hx    Allergies  Allergen Reactions   Carboplatin  Other (See Comments)    Chest tightness/burning, flushing.  See Progress Note from 12/29/2022.   Skin Adhesives [Cyanoacrylate] Hives   Shrimp [Shellfish Allergy] Itching   Sulfasalazine Nausea And Vomiting   Sulfonamide Derivatives Nausea Only   Telithromycin     Other Reaction(s): severe dizziness   Current Outpatient Medications  Medication Sig Dispense Refill Last Dose/Taking   acetaminophen  (TYLENOL ) 500 MG tablet Take 500 mg by mouth every 6 (six) hours as needed for mild pain (pain score 1-3) or moderate pain (pain score 4-6).      ALPRAZolam  (XANAX ) 0.25 MG tablet Take 1 tablet (0.25 mg total) by mouth 2 (two) times daily as needed for anxiety or sleep. 60 tablet 0    apixaban   (ELIQUIS ) 5 MG TABS tablet Take 1 tablet (5 mg total) by mouth 2 (two) times daily. 60 tablet 3    Cholecalciferol (VITAMIN D3) 25 MCG (1000 UT) CAPS Take 1,000 Units by mouth daily.       esomeprazole (NEXIUM) 20 MG capsule Take 20 mg by mouth daily.       estradiol  (ESTRACE  VAGINAL) 0.1 MG/GM vaginal cream Place 1 applicatorful vaginally at bedtime as directed 42.5 g 12    famotidine  (PEPCID ) 20 MG tablet Take 20 mg by mouth as needed for heartburn or indigestion.      lidocaine -prilocaine  (EMLA ) cream Apply topically as needed. 30 g 1    magnesium  oxide (MAG-OX) 400 (240 Mg) MG tablet Take 1 tablet (400 mg total) by mouth daily. 30 tablet 1    Multiple Vitamins-Minerals (MULTI FOR HER PO) Take 1 tablet by mouth daily.      ondansetron  (ZOFRAN -ODT) 4 MG disintegrating tablet Take 1 tablet (4 mg total) by mouth every 8 (eight) hours as needed for nausea or vomiting. 20 tablet 0    oxyCODONE  (OXY IR/ROXICODONE ) 5 MG immediate release tablet Take 1 tablet (5 mg total) by mouth every 6 (six) hours as needed for severe  pain (pain score 7-10) or moderate pain (pain score 4-6). 90 tablet 0    polyethylene glycol (MIRALAX  / GLYCOLAX ) 17 g packet Take 17 g by mouth 2 (two) times daily. (Patient not taking: Reported on 11/09/2023) 14 each 0    prochlorperazine  (COMPAZINE ) 10 MG tablet Take 1 tablet by mouth every 6 hours as needed (Nausea or vomiting). 30 tablet 1    No current facility-administered medications for this encounter.   ROS  Review of Systems  Constitutional:  Positive for fatigue.  Respiratory: Negative.    Cardiovascular:  Positive for leg swelling.  Gastrointestinal:        LLQ colostomy with parastomal hernia  Skin:        Peristomal redness, nonintact lesion to peristomal skin at 12 o'clock  Psychiatric/Behavioral:  Positive for dysphoric mood. The patient is nervous/anxious.    Vital signs:  BP (!) (P) 142/74 (BP Location: Right Arm)   Pulse (P) 80   Temp (P) 98.7 F (37.1 C)  (Oral)   Resp (P) 16   SpO2 (P) 98%  Exam:  Physical Exam Vitals reviewed.  Constitutional:      Appearance: Normal appearance.  HENT:     Mouth/Throat:     Mouth: Mucous membranes are moist.  Cardiovascular:     Rate and Rhythm: Normal rate.  Pulmonary:     Effort: Pulmonary effort is normal.  Abdominal:     Palpations: Abdomen is soft.     Hernia: A hernia is present.  Skin:    General: Skin is warm and dry.  Neurological:     Mental Status: She is alert and oriented to person, place, and time. Mental status is at baseline.  Psychiatric:        Behavior: Behavior normal.     Stoma type/location:  LLQ colostomy, prolapsed, extends 2 cm  Stomal assessment/size:  oval, pink moist prolapsed but productive Peristomal assessment:  bulging from hernia Treatment options for stomal/peristomal skin: 1 piece 2 3/4 flat pouch Output: soft brown stool Ostomy pouching: 1pc. Flat with barrier ring   Education provided:  no changes, stable     Impression/dx  Colostomy complication Discussion  See back as needed.  Plan  Call clinic as needed.     Visit time: 40 minutes.   Darice Cooley FNP-BC

## 2023-12-18 ENCOUNTER — Telehealth: Payer: Self-pay

## 2023-12-18 NOTE — Telephone Encounter (Signed)
 LVM for pt regarding her STD form being completed, and faxed. Will reach out to the pt later. No questions or concerns to be noted at this time.

## 2023-12-19 NOTE — Discharge Instructions (Signed)
 No changes

## 2023-12-21 ENCOUNTER — Ambulatory Visit: Payer: Self-pay

## 2023-12-21 DIAGNOSIS — R2689 Other abnormalities of gait and mobility: Secondary | ICD-10-CM

## 2023-12-21 DIAGNOSIS — Z515 Encounter for palliative care: Secondary | ICD-10-CM

## 2023-12-21 DIAGNOSIS — I890 Lymphedema, not elsewhere classified: Secondary | ICD-10-CM

## 2023-12-21 DIAGNOSIS — R530 Neoplastic (malignant) related fatigue: Secondary | ICD-10-CM

## 2023-12-21 DIAGNOSIS — C7960 Secondary malignant neoplasm of unspecified ovary: Secondary | ICD-10-CM

## 2023-12-21 NOTE — Therapy (Signed)
 OUTPATIENT PHYSICAL THERAPY  LOWER EXTREMITY ONCOLOGY EVALUATION  Patient Name: Stacy Rivas MRN: [id number] DOB:Dec 01, 1955, 68 y.o., female Today's Date: 12/21/2023  END OF SESSION:  PT End of Session - 12/21/23 1356     Visit Number 4    Number of Visits 9    Date for PT Re-Evaluation 02/06/24    PT Start Time 1359    PT Stop Time 1458    PT Time Calculation (min) 59 min    Activity Tolerance Patient tolerated treatment well    Behavior During Therapy WFL for tasks assessed/performed          Past Medical History:  Diagnosis Date   Anxiety state, unspecified    Diverticulosis    Esophageal reflux    Family history of breast cancer    Family history of lymphoma    Family history of stomach cancer    Family history of uterine cancer    Fibroid tumor    Headache    due to allergies    Hiatal hernia    Hyperlipidemia    IBS (irritable bowel syndrome)    Internal hemorrhoids    Nonspecific elevation of levels of transaminase or lactic acid dehydrogenase (LDH)    ovarian ca dx'd 11/2018   Renal stone 07/2013   Tubulovillous adenoma of colon    Varicose veins    superficail thrombophlebitis (left LE)   Past Surgical History:  Procedure Laterality Date   CATARACT EXTRACTION, BILATERAL     cystoscopy with instillation for cystitis  2001   DEBULKING N/A 02/12/2019   Procedure: DEBULKING OF TUMOR;  Surgeon: Eloy Herring, MD;  Location: [facility];  Service: Gynecology;  Laterality: N/A;   DILATION AND CURETTAGE OF UTERUS     miscarriage   HYSTERECTOMY ABDOMINAL WITH SALPINGO-OOPHORECTOMY Bilateral 02/12/2019   Procedure: HYSTERECTOMY ABDOMINAL WITH SALPINGO-OOPHORECTOMY;  Surgeon: Eloy Herring, MD;  Location: [facility];  Service: Gynecology;  Laterality: Bilateral;   IR IMAGING GUIDED PORT INSERTION  12/06/2018   IR IMAGING GUIDED PORT INSERTION  10/10/2019   IR PARACENTESIS  12/03/2018   IR PARACENTESIS  12/14/2018   IR REMOVAL TUN ACCESS W/ PORT W/O FL MOD SED  06/03/2019    LAPAROSCOPY     adhesions   LAPAROTOMY N/A 07/19/2023   Procedure: EXPLORATORY LAPAROTOMY, DIVERTING LOOP COLOSTOMY;  Surgeon: Curvin Deward MOULD, MD;  Location: [facility];  Service: General;  Laterality: N/A;   LITHOTRIPSY  07/2013   x 2   MYOMECTOMY  1992   OMENTECTOMY N/A 02/12/2019   Procedure: OMENTECTOMY;  Surgeon: Eloy Herring, MD;  Location: [facility];  Service: Gynecology;  Laterality: N/A;   PARACENTESIS  11/21/2018   abdominal , removed 3.1 Liters    Patient Active Problem List   Diagnosis Date Noted   Intestinal stoma prolapse (HCC) 11/15/2023   Parastomal hernia without obstruction or gangrene 10/19/2023   Bilateral hydronephrosis 09/28/2023   Bilateral inguinal hernia without obstruction or gangrene 09/27/2023   Peristomal dermatitis 09/19/2023   Hypomagnesemia 09/14/2023   Atrophic vaginitis 08/24/2023   Acute deep vein thrombosis (DVT) of right lower extremity (HCC) 08/18/2023   UTI (urinary tract infection) 08/16/2023   Cancer associated pain 08/03/2023   Irritant contact dermatitis associated with fecal stoma 08/03/2023   Colostomy complication (HCC) 08/03/2023   Colostomy care (HCC) 07/22/2023   Malnutrition of moderate degree 07/19/2023   Hypokalemia 07/17/2023   Large bowel obstruction (HCC) 07/16/2023   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   Other constipation  07/11/2023   Hand foot syndrome 05/25/2023   COVID-19 virus infection 04/01/2022   Sinus infection 03/08/2022   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 02/01/2022   Nasal sore 01/20/2022   Pancytopenia, acquired (HCC) 01/18/2022   Essential hypertension 01/17/2022   Leukopenia due to antineoplastic chemotherapy (HCC) 10/21/2021   Abdominal pain 09/28/2021   Pelvic floor dysfunction in female 04/05/2021   Leg cramps 11/02/2020   Bilateral bunions 09/08/2020   Mucositis due to antineoplastic therapy 08/04/2020   Anorexia 08/04/2020   Chemotherapy-induced fatigue 05/04/2020   Foot pain, bilateral  05/04/2020   Chemotherapy-induced nausea 03/09/2020   Vaginal dryness, menopausal 02/10/2020   Thrombocytopenia (HCC) 10/22/2019   Skin rash 10/14/2019   Foreign body reaction of the skin 07/02/2019   Genetic testing 04/25/2019   Hyperglycemia, drug-induced 04/01/2019   Elevated liver enzymes 04/01/2019   Family history of breast cancer    Family history of uterine cancer    Family history of lymphoma    Family history of stomach cancer    Preventive measure 03/05/2019   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   Anemia due to antineoplastic chemotherapy 01/17/2019   Peritoneal carcinomatosis (HCC) 11/30/2018   Goals of care, counseling/discussion 11/30/2018   Left ovarian epithelial cancer (HCC) 11/29/2018   Uterine leiomyoma 02/28/2017   Vitreous syneresis, bilateral 01/30/2017   Hyperopia with astigmatism and presbyopia, bilateral 01/30/2017   Dry eyes, bilateral 01/30/2017   Dermatochalasis of eyelids of both eyes 01/30/2017   Posterior capsular opacification of both eyes, obscuring vision 01/30/2017   FLATULENCE-GAS-BLOATING 05/29/2008   ABDOMINAL PAIN RIGHT UPPER QUADRANT 05/29/2008   ABNORMAL TRANSAMINASE-LFT'S 05/29/2008   Anxiety state 05/28/2008   GERD 05/28/2008   HIATAL HERNIA 05/28/2008   IBS 05/28/2008   Hiatal hernia 05/28/2008    PCP: Charlie Love, MD  REFERRING PROVIDER: [place] Roys, NP  REFERRING DIAG:  Z51.5 (ICD-10-CM) - Palliative care patient  R53.0 (ICD-10-CM) - Neoplastic malignant related fatigue  C79.60 (ICD-10-CM) - 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 For several days I didn't wear the stocking because it was  too hot. I have trid the MLD but I am not sure if I am using the right pressure. I feel a little off today/light headed but I have been runningall day.  EVAL Pts right leg swelled from a DVT after her ostomy surgery on 07/19/2023  She has been on Elliquis for 3 months. Her leg has reduced considerably. She has been wearing a Thigh high compression stocking for about 2 months.  She feels she is lacking mobility in her right ankle. She gets some intermittent pins and needles in the anterior ankle, with some discomfort, stiffness and limited mobility. Swelling increases in the daytime and reduces at night. She does not think her thigh has really swollen any more. Pt is under palliative care because there is no further chemo for her, however, she wants to be the best that she can. She does note that she has mild neuropathy in her feet.   PERTINENT HISTORY:   68 y.o. 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 Palliative is seeing patient for symptom management and goals of care.  She had colostomy placed 07/19/2023 PAIN:  Are you having pain? No  PRECAUTIONS: Right LE swelling from DVT(on Elliquis),Peritoneal carcinomatosis,  RED FLAGS: Bowel or bladder incontinence: Yes: has ostomy pouch   WEIGHT BEARING RESTRICTIONS: No  FALLS:  Has patient fallen in last 6 months? No  LIVING ENVIRONMENT: Lives with: [facility] Lives in: House/apartment   OCCUPATION: on Medical leave from sales  LEISURE: read, walker but not since blood clot.  PRIOR LEVEL OF FUNCTION: Independent  PATIENT GOALS: improve  mobility right ankle,check swelling   OBJECTIVE: Note: Objective measures were completed at Evaluation unless otherwise noted.  COGNITION: Overall cognitive status: Within functional limits for tasks assessed   PALPATION: Mild pitting right foot and ankle, mild warmth right foot/ankle but has been in stocking  OBSERVATIONS / OTHER ASSESSMENTS: increased edema noted right foot and ankle, thigh despite wearing compression garment. Mild rubor right foot/toes greater than left but with good capillary refill. Does not appear infected  SENSATION: Light touch: Deficits      POSTURE: WFL  LOWER EXTREMITY STRENGTH:  ROM Right eval  Hip flexion   Hip extension   Hip abduction   Hip adduction   Hip internal rotation   Hip external rotation   Knee flexion   Knee extension   Ankle dorsiflexion 2  Ankle plantarflexion 50  Ankle inversion 24  Ankle eversion 12  Great toe extension    (Blank rows = not tested)  ROM LEFT eval  Hip flexion   Hip extension   Hip abduction   Hip adduction   Hip internal rotation   Hip external rotation   Knee flexion   Knee extension   Ankle dorsiflexion 5  Ankle plantarflexion 60  Ankle inversion 25  Ankle eversion 12  Great toe extension     (Blank rows = not tested)  Bilateral Ankle Strength:5/5   LYMPHEDEMA ASSESSMENTS:   SURGERY TYPE/DATE: Abdominal hysterectomy 02/12/2019, Colostomy 09/26/2023  NUMBER OF LYMPH NODES REMOVED: not aware of any  CHEMOTHERAPY: neoadjuvant  RADIATION:NO  HORMONE TREATMENT: NO  INFECTIONS: NO  LYMPHEDEMA ASSESSMENTS:   LOWER EXTREMITY LANDMARK RIGHT eval RIGHT 11/16/2023 RIGHT 11/28/2023  At groin     30 cm proximal to suprapatella     20 cm proximal to suprapatella 55 53 52.8  10 cm proximal to suprapatella 45 45 44.5  At midpatella / popliteal crease 36  34.5  30 cm proximal to floor at lateral plantar foot 35.1  34.1  20 cm proximal to floor at lateral plantar foot 25.8 25.3 24.9  10 cm  proximal to floor at lateral plantar foot 20.5 19.6 19.4  Circumference of ankle/heel 29  27.8  5 cm proximal to 1st MTP joint     Across MTP joint 20.3    Around proximal great toe 7.4  7.1  (Blank rows = not tested)  LOWER EXTREMITY LANDMARK LEFT eval  At groin   30 cm proximal to suprapatella   20 cm proximal to suprapatella 53  10 cm proximal to suprapatella 42.8  At midpatella / popliteal crease 35.6  30 cm proximal to floor at lateral plantar foot 34.4  20 cm proximal to floor at lateral plantar foot 25.4  10 cm proximal to floor at lateral plantar foot 19.7  Circumference of ankle/heel 28.3  5 cm proximal to 1st MTP joint   Across MTP joint 19.7  Around proximal great toe 6.9  (Blank rows = not tested)  FUNCTIONAL TESTS:  Balance screen; difficulty with tandem stance (less than 10 seconds with Right foot forward, SLS limited B to a few seconds  GAIT:Near normal. Mild decreased heel strike toe off                                                                                                                                TREATMENT DATE:  12/21/2023  MLD first by therapist and then repeated each step with pt performing;short neck, Right axillary and inguinal LN's, Right inguino-axillary pathway, Right outer thigh, medial thigh to lateral, and lateral thigh again repeating pathway, then right Lower leg both sides and foot, retracing pathways and ending with LN's. Pt instructed in all techniques and did very well overall with tactile cues for stretch and pressure, and VC's prn. Pt had a good understanding of sequence.    11/28/2023 Pt circumferences measured with good decrease in edema noted especially in ankle and around achilles, and lower leg doing very well. Right thigh and lateral hip with greatest edema. Discussed compression shorts as an option, but pt may have difficulty due to Colostomy. Educated pt in role of MLD with swelling, superficial nature, importance of  stretch. therapist performed  MLD first ;short neck, Right axillary and inguinal LN's, Right inguino-axillary pathway, Right outer thigh, medial thigh to lateral, and lateral thigh again repeating pathway, then right Lower leg both sides and foot, retracing pathways and ending with LN's. Pt instructed in all techniques and did very well overall with tactile cues for stretch and pressure, and VC's prn. Pt had a good understanding of sequence. Gave pt written handout.   11/16/2023 Checked stocking and remeasured leg in several places; good reduction in several places with stronger stocking. Foot aspect of stocking feels a little loose, but pt will wash and try to renew the  stretch there. Incline stretch x 3 runners stretch x 2 ea 30 sec holds HS stretch with strap 3 x 30 each Half tandem and full tandem stance B x 2 ea to failure Vector reaches B 2 sets of 5 with foot touch, 2 sets of 5 no touch between Supine clam red x 15  Bridging x 10 Updated HEP with pictures 619/2025 Measured circumference of bilateral LE's and ROM/strength of Bilateral ankles. Pt noted to have mild increased edema throughout Right LE but most limiting for her in ankle ROM for DR and PF. She has good capillary refill, but noted rubor right foot greater than left with mild pitting edema at the ankle. She is wearing a 15-20 mm thigh high compression stocking. We discussed several options; a 20-30 compression stocking to try and prevent ankle swelling so she would have improved ROM, or a knee high stocking over her present stocking to increase compression at the foot and ankle. She is going to return to [facility] to see about getting a slightly stronger stocking.  She has also been encouraged to elevate her legs as much as possible when sitting.She does have mild neuropathy which may be affecting her balance. She will benefit from education in foot and ankle ROM, gastroc stretching, and balance activities for a HEP. We have set up 1  appt per week for 2 weeks.    PATIENT EDUCATION:  Access Code: [email or web address] URL: [email or web address] Date: 11/16/2023 Prepared by: Grayce Sheldon  Exercises - Supine Bridge  - 1 x daily - 7 x weekly - 1 sets - 10 reps - 5 hold - Hooklying Clamshell with Resistance  - 1 x daily - 7 x weekly - 1 sets - 10-15 reps - 3 hold - Supine Hamstring Stretch with Strap  - 1 x daily - 7 x weekly - 1 sets - 3 reps - 30 hold - Gastroc Stretch on Wall  - 1 x daily - 7 x weekly - 1 sets - 3 reps - 30 hold - Long Sitting Calf Stretch with Strap  - 1 x daily - 7 x weekly - 1 sets - 3 reps - 30 hold - Standing Gastroc Stretch on Step with Counter Support  - 1 x daily - 7 x weekly - 1 sets - 3 reps - 30 hold - Single Leg Balance  - 1 x daily - 7 x weekly - 1 sets - 5 reps    Education details: see 11/08/2023 Person educated: Patient Education method: Explanation Education comprehension: verbalized understanding  HOME EXERCISE PROGRAM:   ASSESSMENT:  CLINICAL IMPRESSION: Pt did exceptionally well with her MLD technique today using excellent pressure and technique with very few cues required. Occasional VC's and TC's but very good overall. Pt felt much better after therapy today  EVAL Patient is a 68 y.o. female who was seen today for physical therapy evaluation and treatment for complaints of right ankle swelling and limitations in ROM since she had a DVT several months ago.. Pt noted to have mild increased edema throughout Right LE but most limiting for her in ankle ROM for DR and PF. She has good capillary refill, but noted rubor right foot greater than left with mild pitting edema at the ankle. She is wearing a 15-20 mm thigh high compression stocking. We discussed several options; a 20-30 compression stocking to try and prevent ankle swelling so she would have improved ROM, or a knee high stocking over her present stocking to increase compression at the  foot and ankle. She is going to  return to [facility] to see about getting a slightly stronger stocking.  She has also been encouraged to elevate her legs as much as possible when sitting.She does have mild neuropathy which may be affecting her balance. She will benefit from education in foot and ankle ROM, gastroc stretching, and balance activities for a HEP. We have set up 1 appt per week for 2 weeks.   OBJECTIVE IMPAIRMENTS: decreased balance, decreased knowledge of condition, decreased mobility, and increased edema.   ACTIVITY LIMITATIONS: sitting, standing, and locomotion level  PARTICIPATION LIMITATIONS: pt is able to participate in all but with increased swelling  PERSONAL FACTORS: 3+ comorbidities: left ovarian cancer with peritoneal carcinomatosis with no further chemo available are also affecting patient's functional outcome.   REHAB POTENTIAL: Good  CLINICAL DECISION MAKING: Evolving/moderate complexity  EVALUATION COMPLEXITY: Moderate   GOALS: Goals reviewed with patient? Yes  SHORT TERM GOALS=LONG TERM GOALS: Target date: 12/07/2023  Pt will have decreased right ankle swelling/ limitations with 20-30 compression garment Baseline: Goal status: MET 11/28/2023 2.  Pt will be independent with a HEP for ankle ROM and balance Baseline:  Goal status: In PROGRESS  3. Pt will be independent with MLD to the right LE to decrease swelling  Baseline: no knowledge  NEW PLAN:  PT FREQUENCY: 1x/weekprn for 4- 6 visits prn ( to return on 7/31) to give pt time to practice  PT DURATION: 10 weeks  PLANNED INTERVENTIONS: 97164- PT Re-evaluation, 97110-Therapeutic exercises, 02887- Neuromuscular re-education, 97535- Self Care, 02859- Manual therapy, 97116- Gait training, [id number]- Orthotic Initial, Therapeutic exercises, Therapeutic activity, Neuromuscular re-education, Gait training, and Self Care  PLAN FOR NEXT SESSION: Instruct gastroc stretch, balance exs; vector reach, SLS, tandem stance. Instruct HEP.    Grayce JINNY Sheldon, PT 12/21/2023, 3:04 PM

## 2023-12-22 ENCOUNTER — Ambulatory Visit (HOSPITAL_COMMUNITY)
Admission: RE | Admit: 2023-12-22 | Discharge: 2023-12-22 | Disposition: A | Discharge status: Home / Self Care | Source: Ambulatory Visit | Attending: *Deleted | Admitting: *Deleted

## 2023-12-22 DIAGNOSIS — K9409 Other complications of colostomy: Secondary | ICD-10-CM | POA: Insufficient documentation

## 2023-12-22 DIAGNOSIS — Z933 Colostomy status: Secondary | ICD-10-CM | POA: Diagnosis present

## 2023-12-22 DIAGNOSIS — K435 Parastomal hernia without obstruction or  gangrene: Secondary | ICD-10-CM

## 2023-12-22 DIAGNOSIS — K9400 Colostomy complication, unspecified: Secondary | ICD-10-CM

## 2023-12-22 NOTE — Progress Notes (Signed)
 [facility]   Reason for visit:  LLQ colostomy with parastomal hernia HPI:   Past Medical History:  Diagnosis Date   Anxiety state, unspecified    Diverticulosis    Esophageal reflux    Family history of breast cancer    Family history of lymphoma    Family history of stomach cancer    Family history of uterine cancer    Fibroid tumor    Headache    due to allergies    Hiatal hernia    Hyperlipidemia    IBS (irritable bowel syndrome)    Internal hemorrhoids    Nonspecific elevation of levels of transaminase or lactic acid dehydrogenase (LDH)    ovarian ca dx'd 11/2018   Renal stone 07/2013   Tubulovillous adenoma of colon    Varicose veins    superficail thrombophlebitis (left LE)   Family History  Problem Relation Age of Onset   Lymphoma Father        dx 79s   Heart disease Father    Hypertension Father    Hypertension Mother    Hyperlipidemia Mother    Breast cancer Mother 22   Diabetes Maternal Grandmother    Uterine cancer Maternal Grandmother        dx 22s   Stomach cancer Paternal Grandfather    Breast cancer Other        grandmother's sisters, both dx 15s   Colon cancer Neg Hx    Allergies  Allergen Reactions   Carboplatin  Other (See Comments)    Chest tightness/burning, flushing.  See Progress Note from 12/29/2022.   Skin Adhesives [Cyanoacrylate] Hives   Shrimp [Shellfish Allergy] Itching   Sulfasalazine Nausea And Vomiting   Sulfonamide Derivatives Nausea Only   Telithromycin     Other Reaction(s): severe dizziness   Current Outpatient Medications  Medication Sig Dispense Refill Last Dose/Taking   acetaminophen  (TYLENOL ) 500 MG tablet Take 500 mg by mouth every 6 (six) hours as needed for mild pain (pain score 1-3) or moderate pain (pain score 4-6).      ALPRAZolam  (XANAX ) 0.25 MG tablet Take 1 tablet (0.25 mg total) by mouth 2 (two) times daily as needed for anxiety or sleep. 60 tablet 0    apixaban  (ELIQUIS ) 5 MG TABS tablet Take 1  tablet (5 mg total) by mouth 2 (two) times daily. 60 tablet 3    Cholecalciferol (VITAMIN D3) 25 MCG (1000 UT) CAPS Take 1,000 Units by mouth daily.       esomeprazole (NEXIUM) 20 MG capsule Take 20 mg by mouth daily.       estradiol  (ESTRACE  VAGINAL) 0.1 MG/GM vaginal cream Place 1 applicatorful vaginally at bedtime as directed 42.5 g 12    famotidine  (PEPCID ) 20 MG tablet Take 20 mg by mouth as needed for heartburn or indigestion.      lidocaine -prilocaine  (EMLA ) cream Apply topically as needed. 30 g 1    magnesium  oxide (MAG-OX) 400 (240 Mg) MG tablet Take 1 tablet (400 mg total) by mouth daily. 30 tablet 1    Multiple Vitamins-Minerals (MULTI FOR HER PO) Take 1 tablet by mouth daily.      ondansetron  (ZOFRAN -ODT) 4 MG disintegrating tablet Take 1 tablet (4 mg total) by mouth every 8 (eight) hours as needed for nausea or vomiting. 20 tablet 0    oxyCODONE  (OXY IR/ROXICODONE ) 5 MG immediate release tablet Take 1 tablet (5 mg total) by mouth every 6 (six) hours as needed for severe pain (pain score  7-10) or moderate pain (pain score 4-6). 90 tablet 0    polyethylene glycol (MIRALAX  / GLYCOLAX ) 17 g packet Take 17 g by mouth 2 (two) times daily. (Patient not taking: Reported on 11/09/2023) 14 each 0    prochlorperazine  (COMPAZINE ) 10 MG tablet Take 1 tablet by mouth every 6 hours as needed (Nausea or vomiting). 30 tablet 1    No current facility-administered medications for this encounter.   ROS  Review of Systems  Constitutional:  Positive for fatigue.  Respiratory: Negative.    Cardiovascular:  Positive for leg swelling.  Musculoskeletal: Negative.   Psychiatric/Behavioral: Negative.    All other systems reviewed and are negative.  Vital signs:  BP 133/68   Pulse 86   Temp 98.2 F (36.8 C) (Oral)   Resp 18   SpO2 99%  Exam:  Physical Exam Constitutional:      Appearance: Normal appearance.  Abdominal:     General: Abdomen is flat.     Hernia: A hernia (parastomal hernia) is  present.     Stoma type/location:  LLQ flush colostomy Stomal assessment/size:  2 inch oval stoma, flush  Peristomal assessment:  parastomal hernia with bulging  Treatment options for stomal/peristomal skin: stoma powder and skin prep barrier ring with small piece of aquacel to area that was nonintact but has since healed.  Output: soft brown stool Ostomy pouching: 2 piece flexible flat pouch  Education provided:  continue same pouching.  Can use 4 inch pouch if hernia requires it.     Impression/dx  Colostomy complication  Parastomal hernia Discussion  Continue same pouching  Plan  See back as needed.     Visit time: 45 minutes.   Darice Cooley FNP-BC

## 2023-12-25 ENCOUNTER — Other Ambulatory Visit

## 2023-12-25 ENCOUNTER — Inpatient Hospital Stay: Attending: Gynecologic Oncology

## 2023-12-25 DIAGNOSIS — Z7189 Other specified counseling: Secondary | ICD-10-CM

## 2023-12-25 DIAGNOSIS — C562 Malignant neoplasm of left ovary: Secondary | ICD-10-CM | POA: Insufficient documentation

## 2023-12-25 DIAGNOSIS — C786 Secondary malignant neoplasm of retroperitoneum and peritoneum: Secondary | ICD-10-CM | POA: Diagnosis not present

## 2023-12-25 DIAGNOSIS — E876 Hypokalemia: Secondary | ICD-10-CM

## 2023-12-25 DIAGNOSIS — D61818 Other pancytopenia: Secondary | ICD-10-CM

## 2023-12-25 LAB — CBC WITH DIFFERENTIAL/PLATELET
Abs Immature Granulocytes: 0.02 K/uL (ref 0.00–0.07)
Basophils Absolute: 0 K/uL (ref 0.0–0.1)
Basophils Relative: 0 %
Eosinophils Absolute: 0.1 K/uL (ref 0.0–0.5)
Eosinophils Relative: 1 %
HCT: 38.5 % (ref 36.0–46.0)
Hemoglobin: 12.9 g/dL (ref 12.0–15.0)
Immature Granulocytes: 0 %
Lymphocytes Relative: 15 %
Lymphs Abs: 1.3 K/uL (ref 0.7–4.0)
MCH: 30.9 pg (ref 26.0–34.0)
MCHC: 33.5 g/dL (ref 30.0–36.0)
MCV: 92.3 fL (ref 80.0–100.0)
Monocytes Absolute: 0.8 K/uL (ref 0.1–1.0)
Monocytes Relative: 9 %
Neutro Abs: 6.2 K/uL (ref 1.7–7.7)
Neutrophils Relative %: 75 %
Platelets: 250 K/uL (ref 150–400)
RBC: 4.17 MIL/uL (ref 3.87–5.11)
RDW: 14.3 % (ref 11.5–15.5)
WBC: 8.4 K/uL (ref 4.0–10.5)
nRBC: 0 % (ref 0.0–0.2)

## 2023-12-25 LAB — COMPREHENSIVE METABOLIC PANEL WITH GFR
ALT: 14 U/L (ref 0–44)
AST: 20 U/L (ref 15–41)
Albumin: 4 g/dL (ref 3.5–5.0)
Alkaline Phosphatase: 90 U/L (ref 38–126)
Anion gap: 5 (ref 5–15)
BUN: 9 mg/dL (ref 8–23)
CO2: 29 mmol/L (ref 22–32)
Calcium: 9.3 mg/dL (ref 8.9–10.3)
Chloride: 104 mmol/L (ref 98–111)
Creatinine, Ser: 0.49 mg/dL (ref 0.44–1.00)
GFR, Estimated: >60 mL/min (ref >60)
Glucose, Bld: 93 mg/dL (ref 70–99)
Potassium: 3.7 mmol/L (ref 3.5–5.1)
Sodium: 138 mmol/L (ref 135–145)
Total Bilirubin: 1.2 mg/dL (ref 0.0–1.2)
Total Protein: 6.8 g/dL (ref 6.5–8.1)

## 2023-12-25 LAB — MAGNESIUM: Magnesium: 1.4 mg/dL — ABNORMAL LOW (ref 1.7–2.4)

## 2023-12-25 LAB — SAMPLE TO BLOOD BANK

## 2023-12-25 MED ORDER — SODIUM CHLORIDE 0.9% FLUSH
10.0000 mL | Freq: Once | INTRAVENOUS | Status: AC
  Administered 2023-12-25: 10 mL

## 2023-12-26 ENCOUNTER — Telehealth

## 2023-12-27 ENCOUNTER — Telehealth: Payer: Self-pay | Admitting: *Deleted

## 2023-12-27 NOTE — Telephone Encounter (Signed)
 Connected with [employer].  Report needing this form as an update.  Received some office notes,  Completed [employer] paperwork   Sent to provider to review, amend, sign and return to this nurse to return to claims benefit manager.  Connected with Nena LITTIE Louder.  Reports [employer] just contacted her about records.

## 2023-12-28 ENCOUNTER — Encounter: Payer: Self-pay | Admitting: *Deleted

## 2023-12-28 ENCOUNTER — Encounter: Payer: Self-pay | Admitting: Hematology and Oncology

## 2023-12-28 NOTE — Telephone Encounter (Signed)
 Today, received [employer] paperwork signed by provider.  Successfully returned to via fax  [phone])..  Copy not sent to [facility] HIM bin for items to be scanned.  Faxed to (SW) [employer] [phone]).  Mailed to patient address on file for return to patient.   Process completed with no further instructions received or actions performed by this nurse.

## 2023-12-28 NOTE — Telephone Encounter (Signed)
 FMLA form completed.

## 2023-12-29 ENCOUNTER — Ambulatory Visit (HOSPITAL_COMMUNITY): Admitting: Nurse Practitioner

## 2024-01-01 ENCOUNTER — Inpatient Hospital Stay: Admitting: Nurse Practitioner

## 2024-01-01 DIAGNOSIS — Z515 Encounter for palliative care: Secondary | ICD-10-CM

## 2024-01-01 DIAGNOSIS — G893 Neoplasm related pain (acute) (chronic): Secondary | ICD-10-CM | POA: Diagnosis not present

## 2024-01-01 DIAGNOSIS — R530 Neoplastic (malignant) related fatigue: Secondary | ICD-10-CM

## 2024-01-01 DIAGNOSIS — M792 Neuralgia and neuritis, unspecified: Secondary | ICD-10-CM

## 2024-01-01 DIAGNOSIS — C7960 Secondary malignant neoplasm of unspecified ovary: Secondary | ICD-10-CM

## 2024-01-01 DIAGNOSIS — Z7189 Other specified counseling: Secondary | ICD-10-CM | POA: Diagnosis not present

## 2024-01-02 ENCOUNTER — Encounter (HOSPITAL_COMMUNITY)
Admission: RE | Admit: 2024-01-02 | Discharge: 2024-01-02 | Disposition: A | Discharge status: Home / Self Care | Source: Ambulatory Visit | Attending: Nurse Practitioner | Admitting: Nurse Practitioner

## 2024-01-02 ENCOUNTER — Encounter: Payer: Self-pay | Admitting: Nurse Practitioner

## 2024-01-02 DIAGNOSIS — K435 Parastomal hernia without obstruction or  gangrene: Secondary | ICD-10-CM

## 2024-01-02 DIAGNOSIS — Z433 Encounter for attention to colostomy: Secondary | ICD-10-CM

## 2024-01-02 DIAGNOSIS — K9409 Other complications of colostomy: Secondary | ICD-10-CM | POA: Insufficient documentation

## 2024-01-02 NOTE — Progress Notes (Signed)
 [facility]   Reason for visit:  LLQ colostomy with parastomal hernia  HPI:   Past Medical History:  Diagnosis Date   Anxiety state, unspecified    Diverticulosis    Esophageal reflux    Family history of breast cancer    Family history of lymphoma    Family history of stomach cancer    Family history of uterine cancer    Fibroid tumor    Headache    due to allergies    Hiatal hernia    Hyperlipidemia    IBS (irritable bowel syndrome)    Internal hemorrhoids    Nonspecific elevation of levels of transaminase or lactic acid dehydrogenase (LDH)    ovarian ca dx'd 11/2018   Renal stone 07/2013   Tubulovillous adenoma of colon    Varicose veins    superficail thrombophlebitis (left LE)   Family History  Problem Relation Age of Onset   Lymphoma Father        dx 57s   Heart disease Father    Hypertension Father    Hypertension Mother    Hyperlipidemia Mother    Breast cancer Mother 23   Diabetes Maternal Grandmother    Uterine cancer Maternal Grandmother        dx 19s   Stomach cancer Paternal Grandfather    Breast cancer Other        grandmother's sisters, both dx 68s   Colon cancer Neg Hx    Allergies  Allergen Reactions   Carboplatin  Other (See Comments)    Chest tightness/burning, flushing.  See Progress Note from 12/29/2022.   Skin Adhesives [Cyanoacrylate] Hives   Shrimp [Shellfish Allergy] Itching   Sulfasalazine Nausea And Vomiting   Sulfonamide Derivatives Nausea Only   Telithromycin     Other Reaction(s): severe dizziness   Current Outpatient Medications  Medication Sig Dispense Refill Last Dose/Taking   acetaminophen  (TYLENOL ) 500 MG tablet Take 500 mg by mouth every 6 (six) hours as needed for mild pain (pain score 1-3) or moderate pain (pain score 4-6).      ALPRAZolam  (XANAX ) 0.25 MG tablet Take 1 tablet (0.25 mg total) by mouth 2 (two) times daily as needed for anxiety or sleep. 60 tablet 0    apixaban  (ELIQUIS ) 5 MG TABS tablet Take 1  tablet (5 mg total) by mouth 2 (two) times daily. 60 tablet 3    Cholecalciferol (VITAMIN D3) 25 MCG (1000 UT) CAPS Take 1,000 Units by mouth daily.       esomeprazole (NEXIUM) 20 MG capsule Take 20 mg by mouth daily.       estradiol  (ESTRACE  VAGINAL) 0.1 MG/GM vaginal cream Place 1 applicatorful vaginally at bedtime as directed 42.5 g 12    famotidine  (PEPCID ) 20 MG tablet Take 20 mg by mouth as needed for heartburn or indigestion.      lidocaine -prilocaine  (EMLA ) cream Apply topically as needed. 30 g 1    magnesium  oxide (MAG-OX) 400 (240 Mg) MG tablet Take 1 tablet (400 mg total) by mouth daily. 30 tablet 1    Multiple Vitamins-Minerals (MULTI FOR HER PO) Take 1 tablet by mouth daily.      ondansetron  (ZOFRAN -ODT) 4 MG disintegrating tablet Take 1 tablet (4 mg total) by mouth every 8 (eight) hours as needed for nausea or vomiting. 20 tablet 0    oxyCODONE  (OXY IR/ROXICODONE ) 5 MG immediate release tablet Take 1 tablet (5 mg total) by mouth every 6 (six) hours as needed for severe pain (pain  score 7-10) or moderate pain (pain score 4-6). 90 tablet 0    polyethylene glycol (MIRALAX  / GLYCOLAX ) 17 g packet Take 17 g by mouth 2 (two) times daily. (Patient not taking: Reported on 11/09/2023) 14 each 0    prochlorperazine  (COMPAZINE ) 10 MG tablet Take 1 tablet by mouth every 6 hours as needed (Nausea or vomiting). 30 tablet 1    No current facility-administered medications for this encounter.   ROS  Review of Systems  Gastrointestinal:        LLQ colostomy  Genitourinary:  Positive for pelvic pain.  Musculoskeletal: Negative.   Skin:  Positive for color change.  Psychiatric/Behavioral:  The patient is nervous/anxious.   All other systems reviewed and are negative.  Vital signs:  BP (!) 147/76 (BP Location: Right Arm)   Pulse 68   Temp 97.6 F (36.4 C) (Oral)   Resp 18   SpO2 100%  Exam:  Physical Exam Vitals reviewed.  Constitutional:      Appearance: Normal appearance.  HENT:      Mouth/Throat:     Mouth: Mucous membranes are moist.  Cardiovascular:     Rate and Rhythm: Normal rate.     Pulses: Normal pulses.  Abdominal:     Palpations: Abdomen is soft.     Hernia: A hernia is present.  Musculoskeletal:        General: Normal range of motion.  Skin:    General: Skin is warm and dry.  Neurological:     Mental Status: She is alert and oriented to person, place, and time. Mental status is at baseline.  Psychiatric:        Mood and Affect: Mood normal.        Behavior: Behavior normal.     Stoma type/location:  LLQ colostomy Stomal assessment/size:  2 1/2 pink moist productive soft brown stool Peristomal assessment:  erythema at 12 o'clock.  Epithelialized but pink  Treatment options for stomal/peristomal skin: stoma powder and skin prep barrier ring  Output: soft brown stool  Ostomy pouching: 1pc flexible flat coloplast Education provided:  Pouch change performed.   Ongoing ostomy care.     Impression/dx  Colostomy Parastomal hernia  Discussion  No changes to pouching Plan  See back as needed.     Visit time: 45 minutes.   Darice Cooley FNP-BC

## 2024-01-02 NOTE — Progress Notes (Signed)
 Palliative Medicine [facility]  [phone] [phone]   Name: Stacy Rivas Date: 01/02/2024 MRN: [id number]  DOB: 1956-02-08  Patient Care Team: Shepard Ade, MD as PCP - General (Internal Medicine) Andra Jovita NOVAK, RN as Registered Nurse Curvin Deward MOULD, MD as Consulting Physician (General Surgery) Lonn Hicks, MD as Consulting Physician (Hematology and Oncology) Eloy Herring, MD (Gynecologic Oncology) Pyrtle, Gordy HERO, MD as Consulting Physician (Gastroenterology) Pickenpack-Cousar, Fannie SAILOR, NP as Nurse Practitioner [facility] and Palliative Medicine)   I connected with Stacy Rivas on 01/02/24 at  4:00 PM EDT by telephone and verified that I am speaking with the correct person using two identifiers.   I discussed the limitations, risks, security and privacy concerns of performing an evaluation and management service by telemedicine and the availability of in-person appointments. I also discussed with the patient that there may be a patient responsible charge related to this service. The patient expressed understanding and agreed to proceed.   Other persons participating in the visit and their role in the encounter: N/A   Patient's location: Home   Provider's location: [facility]   INTERVAL HISTORY: Stacy Rivas is a 68 y.o. 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 Palliative is seeing patient for symptom management and goals of care.   SOCIAL HISTORY:     reports that she quit smoking about 43 years ago. Her smoking use included cigarettes. She started smoking about 53 years ago. She has never used smokeless tobacco. She reports current alcohol  use. She reports that she does not use drugs.  ADVANCE DIRECTIVES:  On file. Patient identifies husband Stacy Rivas as Oncologist with support of her sister and niece Stacy Rivas and Stacy Rivas). Documents  reviewed.   CODE STATUS: DNR  PAST MEDICAL HISTORY: Past Medical History:  Diagnosis Date   Anxiety state, unspecified    Diverticulosis    Esophageal reflux    Family history of breast cancer    Family history of lymphoma    Family history of stomach cancer    Family history of uterine cancer    Fibroid tumor    Headache    due to allergies    Hiatal hernia    Hyperlipidemia    IBS (irritable bowel syndrome)    Internal hemorrhoids    Nonspecific elevation of levels of transaminase or lactic acid dehydrogenase (LDH)    ovarian ca dx'd 11/2018   Renal stone 07/2013   Tubulovillous adenoma of colon    Varicose veins    superficail thrombophlebitis (left LE)    ALLERGIES:  is allergic to carboplatin , skin adhesives [cyanoacrylate], shrimp [shellfish allergy], sulfasalazine, sulfonamide derivatives, and telithromycin.  MEDICATIONS:  Current Outpatient Medications  Medication Sig Dispense Refill   acetaminophen  (TYLENOL ) 500 MG tablet Take 500 mg by mouth every 6 (six) hours as needed for mild pain (pain score 1-3) or moderate pain (pain score 4-6).     ALPRAZolam  (XANAX ) 0.25 MG tablet Take 1 tablet (0.25 mg total) by mouth 2 (two) times daily as needed for anxiety or sleep. 60 tablet 0   apixaban  (ELIQUIS ) 5 MG TABS tablet Take 1 tablet (5 mg total) by mouth 2 (two) times daily. 60 tablet 3   Cholecalciferol (VITAMIN D3) 25 MCG (1000 UT) CAPS Take 1,000 Units by mouth daily.      esomeprazole (NEXIUM) 20 MG capsule Take 20 mg by mouth daily.  estradiol  (ESTRACE  VAGINAL) 0.1 MG/GM vaginal cream Place 1 applicatorful vaginally at bedtime as directed 42.5 g 12   famotidine  (PEPCID ) 20 MG tablet Take 20 mg by mouth as needed for heartburn or indigestion.     lidocaine -prilocaine  (EMLA ) cream Apply topically as needed. 30 g 1   magnesium  oxide (MAG-OX) 400 (240 Mg) MG tablet Take 1 tablet (400 mg total) by mouth daily. 30 tablet 1   Multiple Vitamins-Minerals (MULTI FOR HER PO)  Take 1 tablet by mouth daily.     ondansetron  (ZOFRAN -ODT) 4 MG disintegrating tablet Take 1 tablet (4 mg total) by mouth every 8 (eight) hours as needed for nausea or vomiting. 20 tablet 0   oxyCODONE  (OXY IR/ROXICODONE ) 5 MG immediate release tablet Take 1 tablet (5 mg total) by mouth every 6 (six) hours as needed for severe pain (pain score 7-10) or moderate pain (pain score 4-6). 90 tablet 0   polyethylene glycol (MIRALAX  / GLYCOLAX ) 17 g packet Take 17 g by mouth 2 (two) times daily. (Patient not taking: Reported on 11/09/2023) 14 each 0   prochlorperazine  (COMPAZINE ) 10 MG tablet Take 1 tablet by mouth every 6 hours as needed (Nausea or vomiting). 30 tablet 1   No current facility-administered medications for this visit.    VITAL SIGNS: There were no vitals taken for this visit. There were no vitals filed for this visit.  Estimated body mass index is 25.2 kg/m as calculated from the following:   Height as of 10/05/23: 5' 2 (1.575 m).   Weight as of 10/05/23: 137 lb 12.8 oz (62.5 kg).   PERFORMANCE STATUS (ECOG) : 1 - Symptomatic but completely ambulatory  IMPRESSION: Discussed the use of AI scribe software for clinical note transcription with the patient, who gave verbal consent to proceed.  History of Present Illness Stacy Rivas is a 68 year old female who I connected with by phone for symptom management follow-up. No acute distress noted. Denies concerns of nausea, vomiting, constipation, or diarrhea. No pain or discomfort. Occasional fatigue however remains active. Taking things one day at time.   She initially purchased magnesium  oxide over the counter as advised by a pharmacist but later learned it may not be well absorbed. She has since bought magnesium  glycinate, which she has not yet started taking. She is considering taking 400-500 mg daily. Previously, taking 800 mg of magnesium  oxide caused her to experience loose stools. She has been trying to increase her  dietary magnesium  intake by consuming more nuts, spinach, and legumes.  During her treatments she was advised to eat freely to maintain her weight. She is now considering consulting a nutritionist for dietary guidance to manage her current health conditions.  She is currently on her sixth round of Eliquis  for leg swelling, which has improved, but she still experiences some pins and needles sensation in her ankle. Patients pain and discomfort is well managed with use of oxycodone  5mg  as needed. Does not require around the clock or daily.  All questions answered and support provided.   Goals of Care  11/07/23: We discussed the patient's current illness and what it means in the larger context of their on-going co-morbidities. Natural disease trajectory and expectations were discussed. Mrs. Thomaston is realistic in her understanding of incurable cancer. She speaks to awareness of no additional treatment options with a goal of focusing on her comfort.   We discussed goals and philosophy of palliative and hospice. Education provided on ongoing symptom management and hospice referral  when appropriate and patient/family feels ready.   She discusses her end-of-life care preferences, expressing a desire not to be resuscitated in a terminal state and preferring inpatient hospice care over dying at home. She wishes to avoid a feeding tube but is open to IV fluids if dehydrated for a defined trial. Education provided on MOST and wishes have been documented and completed as desired.   The patient and family outlined their wishes for the following treatment decisions:  Cardiopulmonary Resuscitation: Do Not Attempt Resuscitation (DNR/No CPR)  Medical Interventions: Limited Additional Interventions: Use medical treatment, IV fluids and cardiac monitoring as indicated, DO NOT USE intubation or mechanical ventilation. May consider use of less invasive airway support such as BiPAP or CPAP. Also provide comfort measures.  Transfer to the hospital if indicated. Avoid intensive care.  If in terminal state or no recovery wishes to focus on comfort. Patient wishes to spend last moments at inpatient hospice unit. No desire to pass away in the home if possible.   Antibiotics: Determine use of limitation of antibiotics when infection occurs  IV Fluids: IV fluids for a defined trial period  Feeding Tube: No feeding tube    I discussed the importance of continued conversation with family and their medical providers regarding overall plan of care and treatment options, ensuring decisions are within the context of the patients values and GOCs. Assessment & Plan  Goals of Care Prefers no resuscitation in terminal state or natural passing. Desires hospital evaluation if critically ill but no extensive interventions if terminal. Prefers inpatient hospice at end of life. - Document DNR status and end-of-life care preferences in medical records. - Ensure family awareness of her hospitalization and hospice care wishes. - Facilitate transition to inpatient hospice care when appropriate. -Goals are clear to continue to treat the treatable. Aggressively manage symptoms as needed ensuring best quality of life for as long as she can. Patient and family realistic in their understanding of cancer status and plan of care  Magnesium  supplementation management Current magnesium  supplementation with magnesium  oxide is not well absorbed and may contribute to gastrointestinal symptoms. Magnesium  glycinate is recommended for better absorption. Current magnesium  level is well-managed, and dietary intake of magnesium -rich foods has been increased. She is considering switching to magnesium  glycinate, aiming for a total daily dose of 500 mg, as it is more bioavailable and may reduce gastrointestinal side effects. - Switch to magnesium  glycinate, aiming for a total daily dose of 500 mg. - Schedule a lab appointment in three to four weeks to recheck  magnesium  levels.  Nutritional counseling for chronic disease management She is interested in consulting a nutritionist for dietary management related to chronic disease. Previous experience with a nutritionist during cancer treatment focused on maintaining weight. She is seeking guidance on managing her current condition through diet and is considering contacting [facility] for a consultation. - Contact [facility] for a consultation with a dietitian or nutritionist. - Provide a referral if required by the nutrition service.  Lower extremity swelling and neuropathic symptoms Improvement in lower extremity swelling and neuropathic symptoms with current treatment. Continued exercises from rehabilitation are helping, but some residual swelling and paresthesia persist in the ankle. She is on her sixth round of Eliquis , with no more refills available, and reports significant improvement in symptoms. - Continue current rehabilitation exercises. -Continue oxycodone  as needed.   I will plant to follow-up with patient in 4-6 weeks. Sooner if needed. Labs and port flush in 2-3 weeks. CBC,  CMP, Mg  Patient expressed understanding and was in agreement with this plan. She also understands that She can call the clinic at any time with any questions, concerns, or complaints.   Any controlled substances utilized were prescribed in the context of palliative care. PDMP has been reviewed.   I provided 45 minutes of non face-to-face telephone visit time during this encounter, and > 50% was spent counseling as documented under my assessment & plan. Visit consisted of counseling and education dealing with the complex and emotionally intense issues of symptom management and palliative care in the setting of serious and potentially life-threatening illness.  Levon Borer, AGPCNP-BC  Palliative Medicine Team/[facility]

## 2024-01-03 ENCOUNTER — Telehealth: Payer: Self-pay | Admitting: Nurse Practitioner

## 2024-01-03 ENCOUNTER — Encounter: Payer: Self-pay | Admitting: Hematology and Oncology

## 2024-01-03 NOTE — Telephone Encounter (Signed)
 Scheduled appointments 8/11 los. Talked with the patient and she is aware of the made appointments.

## 2024-01-09 ENCOUNTER — Ambulatory Visit (HOSPITAL_COMMUNITY)
Admission: RE | Admit: 2024-01-09 | Discharge: 2024-01-09 | Disposition: A | Discharge status: Home / Self Care | Source: Ambulatory Visit | Attending: *Deleted | Admitting: *Deleted

## 2024-01-09 DIAGNOSIS — Z433 Encounter for attention to colostomy: Secondary | ICD-10-CM | POA: Diagnosis present

## 2024-01-09 DIAGNOSIS — K435 Parastomal hernia without obstruction or  gangrene: Secondary | ICD-10-CM | POA: Insufficient documentation

## 2024-01-09 NOTE — Progress Notes (Signed)
 [facility]   Reason for visit:  LLQ colostomy with parastomal hernia  HPI:   Past Medical History:  Diagnosis Date   Anxiety state, unspecified    Diverticulosis    Esophageal reflux    Family history of breast cancer    Family history of lymphoma    Family history of stomach cancer    Family history of uterine cancer    Fibroid tumor    Headache    due to allergies    Hiatal hernia    Hyperlipidemia    IBS (irritable bowel syndrome)    Internal hemorrhoids    Nonspecific elevation of levels of transaminase or lactic acid dehydrogenase (LDH)    ovarian ca dx'd 11/2018   Renal stone 07/2013   Tubulovillous adenoma of colon    Varicose veins    superficail thrombophlebitis (left LE)   Family History  Problem Relation Age of Onset   Lymphoma Father        dx 22s   Heart disease Father    Hypertension Father    Hypertension Mother    Hyperlipidemia Mother    Breast cancer Mother 22   Diabetes Maternal Grandmother    Uterine cancer Maternal Grandmother        dx 21s   Stomach cancer Paternal Grandfather    Breast cancer Other        grandmother's sisters, both dx 75s   Colon cancer Neg Hx    Allergies  Allergen Reactions   Carboplatin  Other (See Comments)    Chest tightness/burning, flushing.  See Progress Note from 12/29/2022.   Skin Adhesives [Cyanoacrylate] Hives   Shrimp [Shellfish Allergy] Itching   Sulfasalazine Nausea And Vomiting   Sulfonamide Derivatives Nausea Only   Telithromycin     Other Reaction(s): severe dizziness   Current Outpatient Medications  Medication Sig Dispense Refill Last Dose/Taking   acetaminophen  (TYLENOL ) 500 MG tablet Take 500 mg by mouth every 6 (six) hours as needed for mild pain (pain score 1-3) or moderate pain (pain score 4-6).      ALPRAZolam  (XANAX ) 0.25 MG tablet Take 1 tablet (0.25 mg total) by mouth 2 (two) times daily as needed for anxiety or sleep. 60 tablet 0    apixaban  (ELIQUIS ) 5 MG TABS tablet Take 1  tablet (5 mg total) by mouth 2 (two) times daily. 60 tablet 3    Cholecalciferol (VITAMIN D3) 25 MCG (1000 UT) CAPS Take 1,000 Units by mouth daily.       esomeprazole (NEXIUM) 20 MG capsule Take 20 mg by mouth daily.       estradiol  (ESTRACE  VAGINAL) 0.1 MG/GM vaginal cream Place 1 applicatorful vaginally at bedtime as directed 42.5 g 12    famotidine  (PEPCID ) 20 MG tablet Take 20 mg by mouth as needed for heartburn or indigestion.      lidocaine -prilocaine  (EMLA ) cream Apply topically as needed. 30 g 1    magnesium  oxide (MAG-OX) 400 (240 Mg) MG tablet Take 1 tablet (400 mg total) by mouth daily. 30 tablet 1    Multiple Vitamins-Minerals (MULTI FOR HER PO) Take 1 tablet by mouth daily.      ondansetron  (ZOFRAN -ODT) 4 MG disintegrating tablet Take 1 tablet (4 mg total) by mouth every 8 (eight) hours as needed for nausea or vomiting. 20 tablet 0    oxyCODONE  (OXY IR/ROXICODONE ) 5 MG immediate release tablet Take 1 tablet (5 mg total) by mouth every 6 (six) hours as needed for severe pain (pain  score 7-10) or moderate pain (pain score 4-6). 90 tablet 0    polyethylene glycol (MIRALAX  / GLYCOLAX ) 17 g packet Take 17 g by mouth 2 (two) times daily. (Patient not taking: Reported on 11/09/2023) 14 each 0    prochlorperazine  (COMPAZINE ) 10 MG tablet Take 1 tablet by mouth every 6 hours as needed (Nausea or vomiting). 30 tablet 1    No current facility-administered medications for this encounter.   ROS  Review of Systems  Respiratory: Negative.    Cardiovascular:  Positive for leg swelling.  Gastrointestinal:        Parastomal hernia with LLQ colostomy  Skin:  Positive for color change.       Peristomal redness, skin breakdown has healed  All other systems reviewed and are negative.  Vital signs:  BP (!) 154/73 (BP Location: Right Arm)   Pulse 82   Temp 97.8 F (36.6 C) (Oral)   Resp 18   SpO2 99%  Exam:  Physical Exam Vitals reviewed.  Constitutional:      Appearance: Normal appearance.   HENT:     Mouth/Throat:     Mouth: Mucous membranes are moist.  Cardiovascular:     Rate and Rhythm: Normal rate.     Pulses: Normal pulses.  Pulmonary:     Effort: Pulmonary effort is normal.  Abdominal:     Palpations: Abdomen is soft.  Musculoskeletal:        General: Normal range of motion.  Skin:    General: Skin is warm and dry.     Findings: Erythema present.  Neurological:     General: No focal deficit present.     Mental Status: She is alert. Mental status is at baseline.  Psychiatric:        Mood and Affect: Mood normal.        Behavior: Behavior normal.     Stoma type/location:  LLQ colostomy, flush Stomal assessment/size:  2 1/4 flush pink moist productive Peristomal assessment:  some erythema with pouch removal.  Nonintact lesion has healed Treatment options for stomal/peristomal skin: stoma powder skin prep and barrier ring  1 piece flat coloplast pouch  Output: soft brown stool  Ostomy pouching: 1pc. flat Education provided:  continue as ordered. Switches to 4 inchpouch when stoma protrudes, changes size requiring it.     Impression/dx  Colostomy Parastomal hernia Discussion  No changes Plan  See back as needed    Visit time: 40 minutes.   Darice Cooley FNP-BC

## 2024-01-11 ENCOUNTER — Ambulatory Visit: Attending: Nurse Practitioner

## 2024-01-11 DIAGNOSIS — R530 Neoplastic (malignant) related fatigue: Secondary | ICD-10-CM | POA: Insufficient documentation

## 2024-01-11 DIAGNOSIS — R2689 Other abnormalities of gait and mobility: Secondary | ICD-10-CM | POA: Insufficient documentation

## 2024-01-11 DIAGNOSIS — I890 Lymphedema, not elsewhere classified: Secondary | ICD-10-CM | POA: Insufficient documentation

## 2024-01-11 DIAGNOSIS — Z515 Encounter for palliative care: Secondary | ICD-10-CM | POA: Insufficient documentation

## 2024-01-11 DIAGNOSIS — C7960 Secondary malignant neoplasm of unspecified ovary: Secondary | ICD-10-CM | POA: Insufficient documentation

## 2024-01-11 NOTE — Therapy (Signed)
 OUTPATIENT PHYSICAL THERAPY  LOWER EXTREMITY ONCOLOGY EVALUATION  Patient Name: Stacy Rivas MRN: [id number] DOB:06/12/1955, 68 y.o., female Today's Date: 01/11/2024  END OF SESSION:  PT End of Session - 01/11/24 1057     Visit Number 5    Number of Visits 9    Date for PT Re-Evaluation 02/06/24    Authorization - Visit Number 2    Authorization - Number of Visits 6    PT Start Time 1100    PT Stop Time 1200    PT Time Calculation (min) 60 min    Activity Tolerance Patient tolerated treatment well    Behavior During Therapy WFL for tasks assessed/performed          Past Medical History:  Diagnosis Date   Anxiety state, unspecified    Diverticulosis    Esophageal reflux    Family history of breast cancer    Family history of lymphoma    Family history of stomach cancer    Family history of uterine cancer    Fibroid tumor    Headache    due to allergies    Hiatal hernia    Hyperlipidemia    IBS (irritable bowel syndrome)    Internal hemorrhoids    Nonspecific elevation of levels of transaminase or lactic acid dehydrogenase (LDH)    ovarian ca dx'd 11/2018   Renal stone 07/2013   Tubulovillous adenoma of colon    Varicose veins    superficail thrombophlebitis (left LE)   Past Surgical History:  Procedure Laterality Date   CATARACT EXTRACTION, BILATERAL     cystoscopy with instillation for cystitis  2001   DEBULKING N/A 02/12/2019   Procedure: DEBULKING OF TUMOR;  Surgeon: Eloy Herring, MD;  Location: [facility];  Service: Gynecology;  Laterality: N/A;   DILATION AND CURETTAGE OF UTERUS     miscarriage   HYSTERECTOMY ABDOMINAL WITH SALPINGO-OOPHORECTOMY Bilateral 02/12/2019   Procedure: HYSTERECTOMY ABDOMINAL WITH SALPINGO-OOPHORECTOMY;  Surgeon: Eloy Herring, MD;  Location: [facility];  Service: Gynecology;  Laterality: Bilateral;   IR IMAGING GUIDED PORT INSERTION  12/06/2018   IR IMAGING GUIDED PORT INSERTION  10/10/2019   IR PARACENTESIS  12/03/2018   IR PARACENTESIS   12/14/2018   IR REMOVAL TUN ACCESS W/ PORT W/O FL MOD SED  06/03/2019   LAPAROSCOPY     adhesions   LAPAROTOMY N/A 07/19/2023   Procedure: EXPLORATORY LAPAROTOMY, DIVERTING LOOP COLOSTOMY;  Surgeon: Curvin Deward MOULD, MD;  Location: [facility];  Service: General;  Laterality: N/A;   LITHOTRIPSY  07/2013   x 2   MYOMECTOMY  1992   OMENTECTOMY N/A 02/12/2019   Procedure: OMENTECTOMY;  Surgeon: Eloy Herring, MD;  Location: [facility];  Service: Gynecology;  Laterality: N/A;   PARACENTESIS  11/21/2018   abdominal , removed 3.1 Liters    Patient Active Problem List   Diagnosis Date Noted   Intestinal stoma prolapse (HCC) 11/15/2023   Parastomal hernia without obstruction or gangrene 10/19/2023   Bilateral hydronephrosis 09/28/2023   Bilateral inguinal hernia without obstruction or gangrene 09/27/2023   Peristomal dermatitis 09/19/2023   Hypomagnesemia 09/14/2023   Atrophic vaginitis 08/24/2023   Acute deep vein thrombosis (DVT) of right lower extremity (HCC) 08/18/2023   UTI (urinary tract infection) 08/16/2023   Cancer associated pain 08/03/2023   Irritant contact dermatitis associated with fecal stoma 08/03/2023   Colostomy complication (HCC) 08/03/2023   Colostomy care (HCC) 07/22/2023   Malnutrition of moderate degree 07/19/2023   Hypokalemia 07/17/2023   Large  bowel obstruction (HCC) 07/16/2023   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   Other constipation 07/11/2023   Hand foot syndrome 05/25/2023   COVID-19 virus infection 04/01/2022   Sinus infection 03/08/2022   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 02/01/2022   Nasal sore 01/20/2022   Pancytopenia, acquired (HCC) 01/18/2022   Essential hypertension 01/17/2022   Leukopenia due to antineoplastic chemotherapy (HCC) 10/21/2021   Abdominal pain 09/28/2021   Pelvic floor dysfunction in female 04/05/2021   Leg cramps 11/02/2020   Bilateral bunions 09/08/2020   Mucositis due to antineoplastic therapy 08/04/2020   Anorexia 08/04/2020    Chemotherapy-induced fatigue 05/04/2020   Foot pain, bilateral 05/04/2020   Chemotherapy-induced nausea 03/09/2020   Vaginal dryness, menopausal 02/10/2020   Thrombocytopenia (HCC) 10/22/2019   Skin rash 10/14/2019   Foreign body reaction of the skin 07/02/2019   Genetic testing 04/25/2019   Hyperglycemia, drug-induced 04/01/2019   Elevated liver enzymes 04/01/2019   Family history of breast cancer    Family history of uterine cancer    Family history of lymphoma    Family history of stomach cancer    Preventive measure 03/05/2019   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   Anemia due to antineoplastic chemotherapy 01/17/2019   Peritoneal carcinomatosis (HCC) 11/30/2018   Goals of care, counseling/discussion 11/30/2018   Left ovarian epithelial cancer (HCC) 11/29/2018   Uterine leiomyoma 02/28/2017   Vitreous syneresis, bilateral 01/30/2017   Hyperopia with astigmatism and presbyopia, bilateral 01/30/2017   Dry eyes, bilateral 01/30/2017   Dermatochalasis of eyelids of both eyes 01/30/2017   Posterior capsular opacification of both eyes, obscuring vision 01/30/2017   FLATULENCE-GAS-BLOATING 05/29/2008   ABDOMINAL PAIN RIGHT UPPER QUADRANT 05/29/2008   ABNORMAL TRANSAMINASE-LFT'S 05/29/2008   Anxiety state 05/28/2008   GERD 05/28/2008   HIATAL HERNIA 05/28/2008   IBS 05/28/2008   Hiatal hernia 05/28/2008    PCP: Charlie Love, MD  REFERRING PROVIDER: Missouri Roys, NP  REFERRING DIAG:  Z51.5 (ICD-10-CM) - Palliative care patient  R53.0 (ICD-10-CM) - Neoplastic malignant related fatigue  C79.60 (ICD-10-CM) - 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 I don't feel as limited in my ankle. I have been doing some of the balance and ROM exercises. I have been doing the MLD but I am not sure if its correct. It would help to review again.   EVAL Pts right leg swelled from a DVT after her ostomy surgery on 07/19/2023  She has been on Elliquis for 3 months. Her leg has reduced considerably. She has been wearing a Thigh high compression stocking for about 2 months.  She feels she is lacking mobility in her right ankle. She gets some intermittent pins and needles in the anterior ankle, with some discomfort, stiffness and limited mobility. Swelling increases in the daytime and reduces at night. She does not think her thigh has really swollen any more. Pt is under palliative care because there is no further chemo for her, however, she wants to be the best that she can. She does note that she has mild neuropathy in her feet.   PERTINENT HISTORY:   68 y.o. 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 Palliative is seeing patient for symptom management and goals of care.  She had colostomy placed 07/19/2023 PAIN:  Are you having pain? No  PRECAUTIONS: Right LE swelling from DVT(on Elliquis),Peritoneal carcinomatosis,  RED FLAGS: Bowel or bladder incontinence: Yes: has ostomy pouch   WEIGHT BEARING RESTRICTIONS: No  FALLS:  Has patient fallen in last 6 months? No  LIVING ENVIRONMENT: Lives with: [facility] Lives in: House/apartment   OCCUPATION: on Medical leave from sales  LEISURE: read, walker but not since blood clot.  PRIOR LEVEL OF FUNCTION:  Independent  PATIENT GOALS: improve mobility right ankle,check swelling   OBJECTIVE: Note: Objective measures were completed at Evaluation unless otherwise noted.  COGNITION: Overall cognitive status: Within functional limits for tasks assessed   PALPATION: Mild pitting right foot and ankle, mild warmth right foot/ankle but has been in stocking  OBSERVATIONS / OTHER ASSESSMENTS: increased edema noted right foot and ankle, thigh despite wearing compression garment. Mild rubor right foot/toes greater than left but with good capillary refill. Does not appear infected  SENSATION: Light touch: Deficits      POSTURE: WFL  LOWER EXTREMITY STRENGTH:  ROM Right eval  Hip flexion   Hip extension   Hip abduction   Hip adduction   Hip internal rotation   Hip external rotation   Knee flexion   Knee extension   Ankle dorsiflexion 2  Ankle plantarflexion 50  Ankle inversion 24  Ankle eversion 12  Great toe extension    (Blank rows = not tested)  ROM LEFT eval  Hip flexion   Hip extension   Hip abduction   Hip adduction   Hip internal rotation   Hip external rotation   Knee flexion   Knee extension   Ankle dorsiflexion 5  Ankle plantarflexion 60  Ankle inversion 25  Ankle eversion 12  Great toe extension     (Blank rows = not tested)  Bilateral Ankle Strength:5/5   LYMPHEDEMA ASSESSMENTS:   SURGERY TYPE/DATE: Abdominal hysterectomy 02/12/2019, Colostomy 09/26/2023  NUMBER OF LYMPH NODES REMOVED: not aware of any  CHEMOTHERAPY: neoadjuvant  RADIATION:NO  HORMONE TREATMENT: NO  INFECTIONS: NO  LYMPHEDEMA ASSESSMENTS:   LOWER EXTREMITY LANDMARK RIGHT eval RIGHT 11/16/2023 RIGHT 11/28/2023  At groin     30 cm proximal to suprapatella     20 cm proximal to suprapatella  55 53 52.8  10 cm proximal to suprapatella 45 45 44.5  At midpatella / popliteal crease 36  34.5  30 cm proximal to floor at lateral plantar foot 35.1  34.1  20 cm proximal to floor at  lateral plantar foot 25.8 25.3 24.9  10 cm proximal to floor at lateral plantar foot 20.5 19.6 19.4  Circumference of ankle/heel 29  27.8  5 cm proximal to 1st MTP joint     Across MTP joint 20.3    Around proximal great toe 7.4  7.1  (Blank rows = not tested)  LOWER EXTREMITY LANDMARK LEFT eval  At groin   30 cm proximal to suprapatella   20 cm proximal to suprapatella 53  10 cm proximal to suprapatella 42.8  At midpatella / popliteal crease 35.6  30 cm proximal to floor at lateral plantar foot 34.4  20 cm proximal to floor at lateral plantar foot 25.4  10 cm proximal to floor at lateral plantar foot 19.7  Circumference of ankle/heel 28.3  5 cm proximal to 1st MTP joint   Across MTP joint 19.7  Around proximal great toe 6.9  (Blank rows = not tested)  FUNCTIONAL TESTS:  Balance screen; difficulty with tandem stance (less than 10 seconds with Right foot forward, SLS limited B to a few seconds  GAIT:Near normal. Mild decreased heel strike toe off                                                                                                                                TREATMENT DATE:   01/11/2024  MLD first by therapist and then repeated each step with pt performing;short neck, Right axillary and inguinal LN's, Right inguino-axillary pathway, Right outer thigh, medial thigh to lateral, and lateral thigh again repeating pathway, then right Lower leg both sides and foot, retracing pathways and ending with LN's. Pt practiced  all techniques and did very well overall with occasional tactile cues for stretch and pressure, and VC's prn. Tried varius positions of her leg to make techniques easier for her.  12/21/2023 MLD first by therapist and then repeated each step with pt performing;short neck, Right axillary and inguinal LN's, Right inguino-axillary pathway, Right outer thigh, medial thigh to lateral, and lateral thigh again repeating pathway, then right Lower leg both sides and  foot, retracing pathways and ending with LN's. Pt instructed in all techniques and did very well overall with tactile cues for stretch and pressure, and VC's prn. Pt had a good understanding of sequence.  11/28/2023 Pt circumferences measured with good decrease in edema noted especially in ankle and around achilles, and lower leg doing very well. Right thigh and lateral hip with greatest edema. Discussed compression shorts as an option, but pt may have difficulty due to Colostomy. Educated pt in role of MLD with swelling, superficial nature, importance of stretch. therapist performed  MLD first ;short neck, Right axillary and inguinal LN's, Right inguino-axillary  pathway, Right outer thigh, medial thigh to lateral, and lateral thigh again repeating pathway, then right Lower leg both sides and foot, retracing pathways and ending with LN's. Pt instructed in all techniques and did very well overall with tactile cues for stretch and pressure, and VC's prn. Pt had a good understanding of sequence. Gave pt written handout.   11/16/2023 Checked stocking and remeasured leg in several places; good reduction in several places with stronger stocking. Foot aspect of stocking feels a little loose, but pt will wash and try to renew the stretch there. Incline stretch x 3 runners stretch x 2 ea 30 sec holds HS stretch with strap 3 x 30 each Half tandem and full tandem stance B x 2 ea to failure Vector reaches B 2 sets of 5 with foot touch, 2 sets of 5 no touch between Supine clam red x 15  Bridging x 10 Updated HEP with pictures 619/2025 Measured circumference of bilateral LE's and ROM/strength of Bilateral ankles. Pt noted to have mild increased edema throughout Right LE but most limiting for her in ankle ROM for DR and PF. She has good capillary refill, but noted rubor right foot greater than left with mild pitting edema at the ankle. She is wearing a 15-20 mm thigh high compression stocking. We discussed several  options; a 20-30 compression stocking to try and prevent ankle swelling so she would have improved ROM, or a knee high stocking over her present stocking to increase compression at the foot and ankle. She is going to return to [facility] to see about getting a slightly stronger stocking.  She has also been encouraged to elevate her legs as much as possible when sitting.She does have mild neuropathy which may be affecting her balance. She will benefit from education in foot and ankle ROM, gastroc stretching, and balance activities for a HEP. We have set up 1 appt per week for 2 weeks.    PATIENT EDUCATION:  Access Code: [email or web address] URL: [email or web address] Date: 11/16/2023 Prepared by: Grayce Sheldon  Exercises - Supine Bridge  - 1 x daily - 7 x weekly - 1 sets - 10 reps - 5 hold - Hooklying Clamshell with Resistance  - 1 x daily - 7 x weekly - 1 sets - 10-15 reps - 3 hold - Supine Hamstring Stretch with Strap  - 1 x daily - 7 x weekly - 1 sets - 3 reps - 30 hold - Gastroc Stretch on Wall  - 1 x daily - 7 x weekly - 1 sets - 3 reps - 30 hold - Long Sitting Calf Stretch with Strap  - 1 x daily - 7 x weekly - 1 sets - 3 reps - 30 hold - Standing Gastroc Stretch on Step with Counter Support  - 1 x daily - 7 x weekly - 1 sets - 3 reps - 30 hold - Single Leg Balance  - 1 x daily - 7 x weekly - 1 sets - 5 reps    Education details: see 11/08/2023 Person educated: Patient Education method: Explanation Education comprehension: verbalized understanding  HOME EXERCISE PROGRAM:   ASSESSMENT:  CLINICAL IMPRESSION: Pt did  well with her MLD technique today using excellent pressure and technique with occasional modification to change leg position for easier access and more comfortable use of proper strokes. Occasional VC's and TC's but very good overall. Pt always feels better after performing MLD and feels more relaxed.  EVAL Patient is a 68 y.o. female who was seen  today for  physical therapy evaluation and treatment for complaints of right ankle swelling and limitations in ROM since she had a DVT several months ago.. Pt noted to have mild increased edema throughout Right LE but most limiting for her in ankle ROM for DR and PF. She has good capillary refill, but noted rubor right foot greater than left with mild pitting edema at the ankle. She is wearing a 15-20 mm thigh high compression stocking. We discussed several options; a 20-30 compression stocking to try and prevent ankle swelling so she would have improved ROM, or a knee high stocking over her present stocking to increase compression at the foot and ankle. She is going to return to [facility] to see about getting a slightly stronger stocking.  She has also been encouraged to elevate her legs as much as possible when sitting.She does have mild neuropathy which may be affecting her balance. She will benefit from education in foot and ankle ROM, gastroc stretching, and balance activities for a HEP. We have set up 1 appt per week for 2 weeks.   OBJECTIVE IMPAIRMENTS: decreased balance, decreased knowledge of condition, decreased mobility, and increased edema.   ACTIVITY LIMITATIONS: sitting, standing, and locomotion level  PARTICIPATION LIMITATIONS: pt is able to participate in all but with increased swelling  PERSONAL FACTORS: 3+ comorbidities: left ovarian cancer with peritoneal carcinomatosis with no further chemo available are also affecting patient's functional outcome.   REHAB POTENTIAL: Good  CLINICAL DECISION MAKING: Evolving/moderate complexity  EVALUATION COMPLEXITY: Moderate   GOALS: Goals reviewed with patient? Yes  SHORT TERM GOALS=LONG TERM GOALS: Target date: 12/07/2023  Pt will have decreased right ankle swelling/ limitations with 20-30 compression garment Baseline: Goal status: MET 11/28/2023 2.  Pt will be independent with a HEP for ankle ROM and balance Baseline:  Goal status: In  PROGRESS  3. Pt will be independent with MLD to the right LE to decrease swelling  Baseline: no knowledge  NEW PLAN:  PT FREQUENCY: 1x/weekprn for 4- 6 visits prn ( to return on 7/31) to give pt time to practice  PT DURATION: 10 weeks  PLANNED INTERVENTIONS: 97164- PT Re-evaluation, 97110-Therapeutic exercises, [id number]- Neuromuscular re-education, 97535- Self Care, 02859- Manual therapy, 97116- Gait training, [phone]- Orthotic Initial, Therapeutic exercises, Therapeutic activity, Neuromuscular re-education, Gait training, and Self Care  PLAN FOR NEXT SESSION: Review MLD, Nu Step?,Instruct gastroc stretch, balance exs; vector reach, SLS, tandem stance. Instruct HEP.    Grayce JINNY Sheldon, PT 01/11/2024, 12:23 PM

## 2024-01-16 ENCOUNTER — Ambulatory Visit (HOSPITAL_COMMUNITY): Admitting: Nurse Practitioner

## 2024-01-18 ENCOUNTER — Ambulatory Visit: Payer: Self-pay

## 2024-01-18 DIAGNOSIS — Z515 Encounter for palliative care: Secondary | ICD-10-CM | POA: Diagnosis not present

## 2024-01-18 DIAGNOSIS — R2689 Other abnormalities of gait and mobility: Secondary | ICD-10-CM

## 2024-01-18 DIAGNOSIS — R530 Neoplastic (malignant) related fatigue: Secondary | ICD-10-CM

## 2024-01-18 DIAGNOSIS — I890 Lymphedema, not elsewhere classified: Secondary | ICD-10-CM

## 2024-01-18 DIAGNOSIS — C7960 Secondary malignant neoplasm of unspecified ovary: Secondary | ICD-10-CM

## 2024-01-18 NOTE — Therapy (Signed)
 OUTPATIENT PHYSICAL THERAPY  LOWER EXTREMITY ONCOLOGY EVALUATION  Patient Name: Stacy Rivas MRN: [id number] DOB:1956/01/27, 68 y.o., female Today's Date: 01/18/2024  END OF SESSION:  PT End of Session - 01/18/24 1100     Visit Number 6    Number of Visits 9    Date for PT Re-Evaluation 02/06/24    Authorization - Visit Number 3    Authorization - Number of Visits 6    PT Start Time 1100    PT Stop Time 1157    PT Time Calculation (min) 57 min    Activity Tolerance Patient tolerated treatment well    Behavior During Therapy WFL for tasks assessed/performed          Past Medical History:  Diagnosis Date   Anxiety state, unspecified    Diverticulosis    Esophageal reflux    Family history of breast cancer    Family history of lymphoma    Family history of stomach cancer    Family history of uterine cancer    Fibroid tumor    Headache    due to allergies    Hiatal hernia    Hyperlipidemia    IBS (irritable bowel syndrome)    Internal hemorrhoids    Nonspecific elevation of levels of transaminase or lactic acid dehydrogenase (LDH)    ovarian ca dx'd 11/2018   Renal stone 07/2013   Tubulovillous adenoma of colon    Varicose veins    superficail thrombophlebitis (left LE)   Past Surgical History:  Procedure Laterality Date   CATARACT EXTRACTION, BILATERAL     cystoscopy with instillation for cystitis  2001   DEBULKING N/A 02/12/2019   Procedure: DEBULKING OF TUMOR;  Surgeon: Eloy Herring, MD;  Location: [facility];  Service: Gynecology;  Laterality: N/A;   DILATION AND CURETTAGE OF UTERUS     miscarriage   HYSTERECTOMY ABDOMINAL WITH SALPINGO-OOPHORECTOMY Bilateral 02/12/2019   Procedure: HYSTERECTOMY ABDOMINAL WITH SALPINGO-OOPHORECTOMY;  Surgeon: Eloy Herring, MD;  Location: [facility];  Service: Gynecology;  Laterality: Bilateral;   IR IMAGING GUIDED PORT INSERTION  12/06/2018   IR IMAGING GUIDED PORT INSERTION  10/10/2019   IR PARACENTESIS  12/03/2018   IR PARACENTESIS   12/14/2018   IR REMOVAL TUN ACCESS W/ PORT W/O FL MOD SED  06/03/2019   LAPAROSCOPY     adhesions   LAPAROTOMY N/A 07/19/2023   Procedure: EXPLORATORY LAPAROTOMY, DIVERTING LOOP COLOSTOMY;  Surgeon: Curvin Deward MOULD, MD;  Location: [facility];  Service: General;  Laterality: N/A;   LITHOTRIPSY  07/2013   x 2   MYOMECTOMY  1992   OMENTECTOMY N/A 02/12/2019   Procedure: OMENTECTOMY;  Surgeon: Eloy Herring, MD;  Location: [facility];  Service: Gynecology;  Laterality: N/A;   PARACENTESIS  11/21/2018   abdominal , removed 3.1 Liters    Patient Active Problem List   Diagnosis Date Noted   Intestinal stoma prolapse (HCC) 11/15/2023   Parastomal hernia without obstruction or gangrene 10/19/2023   Bilateral hydronephrosis 09/28/2023   Bilateral inguinal hernia without obstruction or gangrene 09/27/2023   Peristomal dermatitis 09/19/2023   Hypomagnesemia 09/14/2023   Atrophic vaginitis 08/24/2023   Acute deep vein thrombosis (DVT) of right lower extremity (HCC) 08/18/2023   UTI (urinary tract infection) 08/16/2023   Cancer associated pain 08/03/2023   Irritant contact dermatitis associated with fecal stoma 08/03/2023   Colostomy complication (HCC) 08/03/2023   Colostomy care (HCC) 07/22/2023   Malnutrition of moderate degree 07/19/2023   Hypokalemia 07/17/2023   Large  bowel obstruction (HCC) 07/16/2023   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   Other constipation 07/11/2023   Hand foot syndrome 05/25/2023   COVID-19 virus infection 04/01/2022   Sinus infection 03/08/2022   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 02/01/2022   Nasal sore 01/20/2022   Pancytopenia, acquired (HCC) 01/18/2022   Essential hypertension 01/17/2022   Leukopenia due to antineoplastic chemotherapy (HCC) 10/21/2021   Abdominal pain 09/28/2021   Pelvic floor dysfunction in female 04/05/2021   Leg cramps 11/02/2020   Bilateral bunions 09/08/2020   Mucositis due to antineoplastic therapy 08/04/2020   Anorexia 08/04/2020    Chemotherapy-induced fatigue 05/04/2020   Foot pain, bilateral 05/04/2020   Chemotherapy-induced nausea 03/09/2020   Vaginal dryness, menopausal 02/10/2020   Thrombocytopenia (HCC) 10/22/2019   Skin rash 10/14/2019   Foreign body reaction of the skin 07/02/2019   Genetic testing 04/25/2019   Hyperglycemia, drug-induced 04/01/2019   Elevated liver enzymes 04/01/2019   Family history of breast cancer    Family history of uterine cancer    Family history of lymphoma    Family history of stomach cancer    Preventive measure 03/05/2019   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   Anemia due to antineoplastic chemotherapy 01/17/2019   Peritoneal carcinomatosis (HCC) 11/30/2018   Goals of care, counseling/discussion 11/30/2018   Left ovarian epithelial cancer (HCC) 11/29/2018   Uterine leiomyoma 02/28/2017   Vitreous syneresis, bilateral 01/30/2017   Hyperopia with astigmatism and presbyopia, bilateral 01/30/2017   Dry eyes, bilateral 01/30/2017   Dermatochalasis of eyelids of both eyes 01/30/2017   Posterior capsular opacification of both eyes, obscuring vision 01/30/2017   FLATULENCE-GAS-BLOATING 05/29/2008   ABDOMINAL PAIN RIGHT UPPER QUADRANT 05/29/2008   ABNORMAL TRANSAMINASE-LFT'S 05/29/2008   Anxiety state 05/28/2008   GERD 05/28/2008   HIATAL HERNIA 05/28/2008   IBS 05/28/2008   Hiatal hernia 05/28/2008    PCP: Charlie Love, MD  REFERRING PROVIDER: [place] Roys, NP  REFERRING DIAG:  Z51.5 (ICD-10-CM) - Palliative care patient  R53.0 (ICD-10-CM) - Neoplastic malignant related fatigue  C79.60 (ICD-10-CM) - 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, unspecified laterality (HCC)    THERAPY DIAG:  Palliative care patient  Neoplastic malignant related fatigue  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 I have been doing the exercises at home. I think my leg swelling is doing pretty well. I get occasional tingling at my anterior ankle but it goes away very quickly. I don't know what that is.  EVAL Pts right leg swelled from a DVT after her ostomy surgery on 07/19/2023  She has been on Elliquis for 3 months. Her leg has reduced considerably. She has been wearing a Thigh high compression stocking for about 2 months.  She feels she is lacking mobility in her right ankle. She gets some intermittent pins and needles in the anterior ankle, with some discomfort, stiffness and limited mobility. Swelling increases in the daytime and reduces at night. She does not think her thigh has really swollen any more. Pt is under palliative care because there is no further chemo for her, however, she wants to be the best that she can. She does note that she has mild neuropathy in her feet.   PERTINENT HISTORY:   68 y.o. 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 Palliative is seeing patient for symptom management and goals of care.  She had colostomy placed 07/19/2023 PAIN:  Are you having pain? No  PRECAUTIONS: Right LE swelling from DVT(on Elliquis),Peritoneal carcinomatosis,  RED FLAGS: Bowel or bladder incontinence: Yes: has ostomy pouch   WEIGHT BEARING RESTRICTIONS: No  FALLS:  Has patient fallen in last 6 months? No  LIVING ENVIRONMENT: Lives with: [facility] Lives in: House/apartment   OCCUPATION: on Medical leave from sales  LEISURE: read, walker but not since blood  clot.  PRIOR LEVEL OF FUNCTION: Independent  PATIENT GOALS: improve mobility right ankle,check swelling   OBJECTIVE: Note: Objective measures were completed at Evaluation unless otherwise noted.  COGNITION: Overall cognitive status: Within functional limits for tasks assessed   PALPATION: Mild pitting right foot and ankle, mild warmth right foot/ankle but has been in stocking  OBSERVATIONS / OTHER ASSESSMENTS: increased edema noted right foot and ankle, thigh despite wearing compression garment. Mild rubor right foot/toes greater than left but with good capillary refill. Does not appear infected  SENSATION: Light touch: Deficits      POSTURE: WFL  LOWER EXTREMITY STRENGTH:  ROM Right eval  Hip flexion   Hip extension   Hip abduction   Hip adduction   Hip internal rotation   Hip external rotation   Knee flexion   Knee extension   Ankle dorsiflexion 2  Ankle plantarflexion 50  Ankle inversion 24  Ankle eversion 12  Great toe extension    (Blank rows = not tested)  ROM LEFT eval  Hip flexion   Hip extension   Hip abduction   Hip adduction   Hip internal rotation   Hip external rotation   Knee flexion   Knee extension   Ankle dorsiflexion 5  Ankle plantarflexion 60  Ankle inversion 25  Ankle eversion 12  Great toe extension     (Blank rows = not tested)  Bilateral Ankle Strength:5/5   LYMPHEDEMA ASSESSMENTS:   SURGERY TYPE/DATE: Abdominal hysterectomy 02/12/2019, Colostomy 09/26/2023  NUMBER OF LYMPH NODES REMOVED: not aware of any  CHEMOTHERAPY: neoadjuvant  RADIATION:NO  HORMONE TREATMENT: NO  INFECTIONS: NO  LYMPHEDEMA ASSESSMENTS:   LOWER EXTREMITY LANDMARK RIGHT eval RIGHT 11/16/2023 RIGHT 11/28/2023  At groin     30 cm proximal to suprapatella     20 cm  proximal to suprapatella 55 53 52.8  10 cm proximal to suprapatella 45 45 44.5  At midpatella / popliteal crease 36  34.5  30 cm proximal to floor at lateral plantar foot 35.1  34.1   20 cm proximal to floor at lateral plantar foot 25.8 25.3 24.9  10 cm proximal to floor at lateral plantar foot 20.5 19.6 19.4  Circumference of ankle/heel 29  27.8  5 cm proximal to 1st MTP joint     Across MTP joint 20.3    Around proximal great toe 7.4  7.1  (Blank rows = not tested)  LOWER EXTREMITY LANDMARK LEFT eval  At groin   30 cm proximal to suprapatella   20 cm proximal to suprapatella 53  10 cm proximal to suprapatella 42.8  At midpatella / popliteal crease 35.6  30 cm proximal to floor at lateral plantar foot 34.4  20 cm proximal to floor at lateral plantar foot 25.4  10 cm proximal to floor at lateral plantar foot 19.7  Circumference of ankle/heel 28.3  5 cm proximal to 1st MTP joint   Across MTP joint 19.7  Around proximal great toe 6.9  (Blank rows = not tested)  FUNCTIONAL TESTS:  Balance screen; difficulty with tandem stance (less than 10 seconds with Right foot forward, SLS limited B to a few seconds  GAIT:Near normal. Mild decreased heel strike toe off                                                                                                                                TREATMENT DATE:   01/18/2024 Marching in bars without HH x 10 ea Heel raises x 10 Sidestepping 6 lengths no hands Ax beam x 6 lengths forward, 4 lengths sideways with core stab SLS x 3 ea to failure Nu Step;seat 6, UE 8 x 7 min, Lev 5 Tandem stance B to failure SLS Cone touches to failure with cone on counter x 5 ea Standing postural exercises; scapular retraction, shoulder ext, Bilateral ER with yellow x 10. Education on erect posture throughout Also tried SLS with scap retraction to failure x 2 ea.   01/11/2024  MLD first by therapist and then repeated each step with pt performing;short neck, Right axillary and inguinal LN's, Right inguino-axillary pathway, Right outer thigh, medial thigh to lateral, and lateral thigh again repeating pathway, then right Lower leg both  sides and foot, retracing pathways and ending with LN's. Pt practiced  all techniques and did very well overall with occasional tactile cues for stretch and pressure, and VC's prn. Tried varius positions of her leg to make techniques easier for her.  12/21/2023 MLD first by therapist and then repeated each step with pt performing;short neck, Right axillary and inguinal LN's, Right inguino-axillary pathway, Right outer thigh, medial thigh to lateral, and lateral thigh again repeating pathway, then right Lower leg both sides and foot, retracing pathways and ending with LN's. Pt instructed  in all techniques and did very well overall with tactile cues for stretch and pressure, and VC's prn. Pt had a good understanding of sequence.  11/28/2023 Pt circumferences measured with good decrease in edema noted especially in ankle and around achilles, and lower leg doing very well. Right thigh and lateral hip with greatest edema. Discussed compression shorts as an option, but pt may have difficulty due to Colostomy. Educated pt in role of MLD with swelling, superficial nature, importance of stretch. therapist performed  MLD first ;short neck, Right axillary and inguinal LN's, Right inguino-axillary pathway, Right outer thigh, medial thigh to lateral, and lateral thigh again repeating pathway, then right Lower leg both sides and foot, retracing pathways and ending with LN's. Pt instructed in all techniques and did very well overall with tactile cues for stretch and pressure, and VC's prn. Pt had a good understanding of sequence. Gave pt written handout.   11/16/2023 Checked stocking and remeasured leg in several places; good reduction in several places with stronger stocking. Foot aspect of stocking feels a little loose, but pt will wash and try to renew the stretch there. Incline stretch x 3 runners stretch x 2 ea 30 sec holds HS stretch with strap 3 x 30 each Half tandem and full tandem stance B x 2 ea to  failure Vector reaches B 2 sets of 5 with foot touch, 2 sets of 5 no touch between Supine clam red x 15  Bridging x 10 Updated HEP with pictures 619/2025 Measured circumference of bilateral LE's and ROM/strength of Bilateral ankles. Pt noted to have mild increased edema throughout Right LE but most limiting for her in ankle ROM for DR and PF. She has good capillary refill, but noted rubor right foot greater than left with mild pitting edema at the ankle. She is wearing a 15-20 mm thigh high compression stocking. We discussed several options; a 20-30 compression stocking to try and prevent ankle swelling so she would have improved ROM, or a knee high stocking over her present stocking to increase compression at the foot and ankle. She is going to return to [facility] to see about getting a slightly stronger stocking.  She has also been encouraged to elevate her legs as much as possible when sitting.She does have mild neuropathy which may be affecting her balance. She will benefit from education in foot and ankle ROM, gastroc stretching, and balance activities for a HEP. We have set up 1 appt per week for 2 weeks.    PATIENT EDUCATION:  Access Code: [email or web address] URL: [email or web address] Date: 11/16/2023 Prepared by: Grayce Sheldon  Exercises - Supine Bridge  - 1 x daily - 7 x weekly - 1 sets - 10 reps - 5 hold - Hooklying Clamshell with Resistance  - 1 x daily - 7 x weekly - 1 sets - 10-15 reps - 3 hold - Supine Hamstring Stretch with Strap  - 1 x daily - 7 x weekly - 1 sets - 3 reps - 30 hold - Gastroc Stretch on Wall  - 1 x daily - 7 x weekly - 1 sets - 3 reps - 30 hold - Long Sitting Calf Stretch with Strap  - 1 x daily - 7 x weekly - 1 sets - 3 reps - 30 hold - Standing Gastroc Stretch on Step with Counter Support  - 1 x daily - 7 x weekly - 1 sets - 3 reps - 30 hold - Single Leg Balance  - 1 x daily -  7 x weekly - 1 sets - 5 reps    Education details: see 11/08/2023 Person  educated: Patient Education method: Explanation Education comprehension: verbalized understanding  HOME EXERCISE PROGRAM:   ASSESSMENT:  CLINICAL IMPRESSION: Pt did well with tandem balance but was challenged  with higher level balance activities on airex beam and with cone touches. Pt requires cueing for core activation and erect posture intermittently with exs. She was mildly fatigued at completion of exercises but reported feeling good.  EVAL Patient is a 68 y.o. female who was seen today for physical therapy evaluation and treatment for complaints of right ankle swelling and limitations in ROM since she had a DVT several months ago.. Pt noted to have mild increased edema throughout Right LE but most limiting for her in ankle ROM for DR and PF. She has good capillary refill, but noted rubor right foot greater than left with mild pitting edema at the ankle. She is wearing a 15-20 mm thigh high compression stocking. We discussed several options; a 20-30 compression stocking to try and prevent ankle swelling so she would have improved ROM, or a knee high stocking over her present stocking to increase compression at the foot and ankle. She is going to return to [facility] to see about getting a slightly stronger stocking.  She has also been encouraged to elevate her legs as much as possible when sitting.She does have mild neuropathy which may be affecting her balance. She will benefit from education in foot and ankle ROM, gastroc stretching, and balance activities for a HEP. We have set up 1 appt per week for 2 weeks.   OBJECTIVE IMPAIRMENTS: decreased balance, decreased knowledge of condition, decreased mobility, and increased edema.   ACTIVITY LIMITATIONS: sitting, standing, and locomotion level  PARTICIPATION LIMITATIONS: pt is able to participate in all but with increased swelling  PERSONAL FACTORS: 3+ comorbidities: left ovarian cancer with peritoneal carcinomatosis with no further chemo  available are also affecting patient's functional outcome.   REHAB POTENTIAL: Good  CLINICAL DECISION MAKING: Evolving/moderate complexity  EVALUATION COMPLEXITY: Moderate   GOALS: Goals reviewed with patient? Yes  SHORT TERM GOALS=LONG TERM GOALS: Target date: 12/07/2023  Pt will have decreased right ankle swelling/ limitations with 20-30 compression garment Baseline: Goal status: MET 11/28/2023 2.  Pt will be independent with a HEP for ankle ROM and balance Baseline:  Goal status: In PROGRESS  3. Pt will be independent with MLD to the right LE to decrease swelling  Baseline: no knowledge  NEW PLAN:  PT FREQUENCY: 1x/weekprn for 4- 6 visits prn ( to return on 7/31) to give pt time to practice  PT DURATION: 10 weeks  PLANNED INTERVENTIONS: 97164- PT Re-evaluation, 97110-Therapeutic exercises, 02887- Neuromuscular re-education, 97535- Self Care, 02859- Manual therapy, 97116- Gait training, [phone]- Orthotic Initial, Therapeutic exercises, Therapeutic activity, Neuromuscular re-education, Gait training, and Self Care  PLAN FOR NEXT SESSION: Review MLD, Nu Step?,Instruct gastroc stretch, balance exs; vector reach, SLS, tandem stance. Instruct HEP.    Grayce JINNY Sheldon, PT 01/18/2024, 1:27 PM

## 2024-01-19 ENCOUNTER — Ambulatory Visit (HOSPITAL_COMMUNITY)
Admission: RE | Admit: 2024-01-19 | Discharge: 2024-01-19 | Disposition: A | Discharge status: Home / Self Care | Source: Ambulatory Visit | Attending: Internal Medicine | Admitting: Internal Medicine

## 2024-01-19 DIAGNOSIS — K9409 Other complications of colostomy: Secondary | ICD-10-CM | POA: Insufficient documentation

## 2024-01-19 DIAGNOSIS — Z7901 Long term (current) use of anticoagulants: Secondary | ICD-10-CM | POA: Insufficient documentation

## 2024-01-19 DIAGNOSIS — K9400 Colostomy complication, unspecified: Secondary | ICD-10-CM

## 2024-01-19 DIAGNOSIS — K435 Parastomal hernia without obstruction or  gangrene: Secondary | ICD-10-CM | POA: Diagnosis not present

## 2024-01-19 NOTE — Progress Notes (Signed)
 [facility]   Reason for visit:  LLQ colostomy with parastomal hernia HPI:  Peritoneal carcinomatosis Past Medical History:  Diagnosis Date   Anxiety state, unspecified    Diverticulosis    Esophageal reflux    Family history of breast cancer    Family history of lymphoma    Family history of stomach cancer    Family history of uterine cancer    Fibroid tumor    Headache    due to allergies    Hiatal hernia    Hyperlipidemia    IBS (irritable bowel syndrome)    Internal hemorrhoids    Nonspecific elevation of levels of transaminase or lactic acid dehydrogenase (LDH)    ovarian ca dx'd 11/2018   Renal stone 07/2013   Tubulovillous adenoma of colon    Varicose veins    superficail thrombophlebitis (left LE)   Family History  Problem Relation Age of Onset   Lymphoma Father        dx 48s   Heart disease Father    Hypertension Father    Hypertension Mother    Hyperlipidemia Mother    Breast cancer Mother 109   Diabetes Maternal Grandmother    Uterine cancer Maternal Grandmother        dx 14s   Stomach cancer Paternal Grandfather    Breast cancer Other        grandmother's sisters, both dx 64s   Colon cancer Neg Hx    Allergies  Allergen Reactions   Carboplatin  Other (See Comments)    Chest tightness/burning, flushing.  See Progress Note from 12/29/2022.   Skin Adhesives [Cyanoacrylate] Hives   Shrimp [Shellfish Allergy] Itching   Sulfasalazine Nausea And Vomiting   Sulfonamide Derivatives Nausea Only   Telithromycin     Other Reaction(s): severe dizziness   Current Outpatient Medications  Medication Sig Dispense Refill Last Dose/Taking   acetaminophen  (TYLENOL ) 500 MG tablet Take 500 mg by mouth every 6 (six) hours as needed for mild pain (pain score 1-3) or moderate pain (pain score 4-6).      ALPRAZolam  (XANAX ) 0.25 MG tablet Take 1 tablet (0.25 mg total) by mouth 2 (two) times daily as needed for anxiety or sleep. 60 tablet 0    apixaban  (ELIQUIS ) 5  MG TABS tablet Take 1 tablet (5 mg total) by mouth 2 (two) times daily. 60 tablet 0    Cholecalciferol (VITAMIN D3) 25 MCG (1000 UT) CAPS Take 1,000 Units by mouth daily.       esomeprazole (NEXIUM) 20 MG capsule Take 20 mg by mouth daily.       estradiol  (ESTRACE  VAGINAL) 0.1 MG/GM vaginal cream Place 1 applicatorful vaginally at bedtime as directed 42.5 g 12    famotidine  (PEPCID ) 20 MG tablet Take 20 mg by mouth as needed for heartburn or indigestion.      lidocaine -prilocaine  (EMLA ) cream Apply topically as needed. 30 g 1    magnesium  oxide (MAG-OX) 400 (240 Mg) MG tablet Take 1 tablet (400 mg total) by mouth daily. 30 tablet 1    Multiple Vitamins-Minerals (MULTI FOR HER PO) Take 1 tablet by mouth daily.      ondansetron  (ZOFRAN -ODT) 4 MG disintegrating tablet Take 1 tablet (4 mg total) by mouth every 8 (eight) hours as needed for nausea or vomiting. 20 tablet 0    oxyCODONE  (OXY IR/ROXICODONE ) 5 MG immediate release tablet Take 1 tablet (5 mg total) by mouth every 6 (six) hours as needed for severe pain (pain  score 7-10) or moderate pain (pain score 4-6). 90 tablet 0    polyethylene glycol (MIRALAX  / GLYCOLAX ) 17 g packet Take 17 g by mouth 2 (two) times daily. (Patient not taking: Reported on 11/09/2023) 14 each 0    prochlorperazine  (COMPAZINE ) 10 MG tablet Take 1 tablet by mouth every 6 hours as needed (Nausea or vomiting). 30 tablet 1    No current facility-administered medications for this encounter.   ROS  Review of Systems  Constitutional:  Positive for fatigue.  Gastrointestinal:        Colostomy Prolapsed stoma Parastomal hernia  Skin:  Positive for color change.  Psychiatric/Behavioral:  The patient is nervous/anxious.   All other systems reviewed and are negative.  Vital signs:  BP 137/75   Pulse 78   Temp (!) 97.3 F (36.3 C) (Oral)   Resp 18   SpO2 98%  Exam:  Physical Exam Vitals reviewed.  Constitutional:      Appearance: Normal appearance.  HENT:      Mouth/Throat:     Mouth: Mucous membranes are moist.  Cardiovascular:     Rate and Rhythm: Normal rate.  Pulmonary:     Effort: Pulmonary effort is normal.  Abdominal:     Palpations: Abdomen is soft.     Hernia: A hernia is present.  Musculoskeletal:        General: Normal range of motion.  Skin:    General: Skin is warm and dry.     Findings: Erythema present.  Neurological:     General: No focal deficit present.     Mental Status: She is alert and oriented to person, place, and time. Mental status is at baseline.  Psychiatric:        Behavior: Behavior normal.     Stoma type/location:  LLQ colostomy Stomal assessment/size:  oval, prolapsed pink and moist  Peristomal assessment:  intact bulging from parastomal hernia creates rounded pouching surface.  Treatment options for stomal/peristomal skin: stoma powder and skin prep barrier ring  Output: soft brown stool  Ostomy pouching: 2pc. Flexible pouch with barrier ring   Education provided:  Stoma is changing in shape and size with peristalsis.  Switches between 1 piece 4 inch pouch and 2 piece flexible pouch as needed.     Impression/dx  Parastomal hernia Colostomy complication  Discussion  Will update orders with [facility] so she has both pouches available.   Plan  See back as needed.      Visit time: 45 minutes.   Darice Cooley FNP-BC

## 2024-01-23 ENCOUNTER — Inpatient Hospital Stay: Attending: Gynecologic Oncology

## 2024-01-23 DIAGNOSIS — C562 Malignant neoplasm of left ovary: Secondary | ICD-10-CM | POA: Diagnosis present

## 2024-01-23 DIAGNOSIS — Z95828 Presence of other vascular implants and grafts: Secondary | ICD-10-CM

## 2024-01-23 LAB — CMP (CANCER CENTER ONLY)
ALT: 21 U/L (ref 0–44)
AST: 26 U/L (ref 15–41)
Albumin: 3.7 g/dL (ref 3.5–5.0)
Alkaline Phosphatase: 115 U/L (ref 38–126)
Anion gap: 6 (ref 5–15)
BUN: 13 mg/dL (ref 8–23)
CO2: 27 mmol/L (ref 22–32)
Calcium: 8.9 mg/dL (ref 8.9–10.3)
Chloride: 107 mmol/L (ref 98–111)
Creatinine: 0.45 mg/dL (ref 0.44–1.00)
GFR, Estimated: >60 mL/min (ref >60)
Glucose, Bld: 114 mg/dL — ABNORMAL HIGH (ref 70–99)
Potassium: 3.7 mmol/L (ref 3.5–5.1)
Sodium: 140 mmol/L (ref 135–145)
Total Bilirubin: 1 mg/dL (ref 0.0–1.2)
Total Protein: 6.4 g/dL — ABNORMAL LOW (ref 6.5–8.1)

## 2024-01-23 LAB — CBC WITH DIFFERENTIAL (CANCER CENTER ONLY)
Abs Immature Granulocytes: 0.02 K/uL (ref 0.00–0.07)
Basophils Absolute: 0 K/uL (ref 0.0–0.1)
Basophils Relative: 0 %
Eosinophils Absolute: 0.1 K/uL (ref 0.0–0.5)
Eosinophils Relative: 1 %
HCT: 35.9 % — ABNORMAL LOW (ref 36.0–46.0)
Hemoglobin: 12.1 g/dL (ref 12.0–15.0)
Immature Granulocytes: 0 %
Lymphocytes Relative: 22 %
Lymphs Abs: 1.5 K/uL (ref 0.7–4.0)
MCH: 31.1 pg (ref 26.0–34.0)
MCHC: 33.7 g/dL (ref 30.0–36.0)
MCV: 92.3 fL (ref 80.0–100.0)
Monocytes Absolute: 0.5 K/uL (ref 0.1–1.0)
Monocytes Relative: 6 %
Neutro Abs: 4.9 K/uL (ref 1.7–7.7)
Neutrophils Relative %: 71 %
Platelet Count: 255 K/uL (ref 150–400)
RBC: 3.89 MIL/uL (ref 3.87–5.11)
RDW: 14.6 % (ref 11.5–15.5)
WBC Count: 7.1 K/uL (ref 4.0–10.5)
nRBC: 0 % (ref 0.0–0.2)

## 2024-01-23 LAB — MAGNESIUM: Magnesium: 1.5 mg/dL — ABNORMAL LOW (ref 1.7–2.4)

## 2024-01-24 ENCOUNTER — Other Ambulatory Visit (HOSPITAL_COMMUNITY): Payer: Self-pay

## 2024-01-24 ENCOUNTER — Other Ambulatory Visit: Payer: Self-pay

## 2024-01-24 MED ORDER — APIXABAN 5 MG PO TABS
5.0000 mg | ORAL_TABLET | Freq: Two times a day (BID) | ORAL | 0 refills | Status: DC
  Filled 2024-01-24: qty 60, 30d supply, fill #0

## 2024-01-24 NOTE — Telephone Encounter (Signed)
 Pt stopped RN in hallway asking for a refill of her Eliquis . Pt reports that her oncology team had previously been prescribing it and had recommended 6 months of eliquis . Patient has completed 5 mopnths, but they did not send in a refill for the 6th month of eliquis . Per the patient, oncology team was deferring further refills to palliative care. Nikki, NP notified, one-time refill sent in for time being, pt advised to follow up with PCP for long term monitoring. No further needs at this time.

## 2024-01-25 ENCOUNTER — Ambulatory Visit

## 2024-01-26 ENCOUNTER — Ambulatory Visit (HOSPITAL_COMMUNITY)
Admission: RE | Admit: 2024-01-26 | Discharge: 2024-01-26 | Disposition: A | Discharge status: Home / Self Care | Source: Ambulatory Visit | Attending: Nurse Practitioner | Admitting: Nurse Practitioner

## 2024-01-26 ENCOUNTER — Other Ambulatory Visit (HOSPITAL_COMMUNITY): Payer: Self-pay | Admitting: Nurse Practitioner

## 2024-01-26 DIAGNOSIS — K435 Parastomal hernia without obstruction or  gangrene: Secondary | ICD-10-CM

## 2024-01-26 DIAGNOSIS — K9400 Colostomy complication, unspecified: Secondary | ICD-10-CM | POA: Diagnosis not present

## 2024-01-26 DIAGNOSIS — Z433 Encounter for attention to colostomy: Secondary | ICD-10-CM | POA: Diagnosis present

## 2024-01-26 DIAGNOSIS — K634 Enteroptosis: Secondary | ICD-10-CM | POA: Insufficient documentation

## 2024-01-26 DIAGNOSIS — K4020 Bilateral inguinal hernia, without obstruction or gangrene, not specified as recurrent: Secondary | ICD-10-CM

## 2024-01-26 DIAGNOSIS — L24B3 Irritant contact dermatitis related to fecal or urinary stoma or fistula: Secondary | ICD-10-CM

## 2024-01-26 NOTE — Discharge Instructions (Addendum)
 Will update [facility] for supplies

## 2024-01-26 NOTE — Discharge Instructions (Signed)
 Switching between 2 piece flexible and 4 inch 1 piece flat pouches

## 2024-01-26 NOTE — Progress Notes (Signed)
 [facility]   Reason for visit:  LLQ colostomy with parastomal hernia   stomal prolapse HPI:  Peritoneal adenocarcinoma Past Medical History:  Diagnosis Date   Anxiety state, unspecified    Diverticulosis    Esophageal reflux    Family history of breast cancer    Family history of lymphoma    Family history of stomach cancer    Family history of uterine cancer    Fibroid tumor    Headache    due to allergies    Hiatal hernia    Hyperlipidemia    IBS (irritable bowel syndrome)    Internal hemorrhoids    Nonspecific elevation of levels of transaminase or lactic acid dehydrogenase (LDH)    ovarian ca dx'd 11/2018   Renal stone 07/2013   Tubulovillous adenoma of colon    Varicose veins    superficail thrombophlebitis (left LE)   Family History  Problem Relation Age of Onset   Lymphoma Father        dx 75s   Heart disease Father    Hypertension Father    Hypertension Mother    Hyperlipidemia Mother    Breast cancer Mother 45   Diabetes Maternal Grandmother    Uterine cancer Maternal Grandmother        dx 50s   Stomach cancer Paternal Grandfather    Breast cancer Other        grandmother's sisters, both dx 7s   Colon cancer Neg Hx    Allergies  Allergen Reactions   Carboplatin  Other (See Comments)    Chest tightness/burning, flushing.  See Progress Note from 12/29/2022.   Skin Adhesives [Cyanoacrylate] Hives   Shrimp [Shellfish Allergy] Itching   Sulfasalazine Nausea And Vomiting   Sulfonamide Derivatives Nausea Only   Telithromycin     Other Reaction(s): severe dizziness   Current Outpatient Medications  Medication Sig Dispense Refill Last Dose/Taking   acetaminophen  (TYLENOL ) 500 MG tablet Take 500 mg by mouth every 6 (six) hours as needed for mild pain (pain score 1-3) or moderate pain (pain score 4-6).      ALPRAZolam  (XANAX ) 0.25 MG tablet Take 1 tablet (0.25 mg total) by mouth 2 (two) times daily as needed for anxiety or sleep. 60 tablet 0     apixaban  (ELIQUIS ) 5 MG TABS tablet Take 1 tablet (5 mg total) by mouth 2 (two) times daily. 60 tablet 0    Cholecalciferol (VITAMIN D3) 25 MCG (1000 UT) CAPS Take 1,000 Units by mouth daily.       esomeprazole (NEXIUM) 20 MG capsule Take 20 mg by mouth daily.       estradiol  (ESTRACE  VAGINAL) 0.1 MG/GM vaginal cream Place 1 applicatorful vaginally at bedtime as directed 42.5 g 12    famotidine  (PEPCID ) 20 MG tablet Take 20 mg by mouth as needed for heartburn or indigestion.      lidocaine -prilocaine  (EMLA ) cream Apply topically as needed. 30 g 1    magnesium  oxide (MAG-OX) 400 (240 Mg) MG tablet Take 1 tablet (400 mg total) by mouth daily. 30 tablet 1    Multiple Vitamins-Minerals (MULTI FOR HER PO) Take 1 tablet by mouth daily.      ondansetron  (ZOFRAN -ODT) 4 MG disintegrating tablet Take 1 tablet (4 mg total) by mouth every 8 (eight) hours as needed for nausea or vomiting. 20 tablet 0    oxyCODONE  (OXY IR/ROXICODONE ) 5 MG immediate release tablet Take 1 tablet (5 mg total) by mouth every 6 (six) hours as needed  for severe pain (pain score 7-10) or moderate pain (pain score 4-6). 90 tablet 0    polyethylene glycol (MIRALAX  / GLYCOLAX ) 17 g packet Take 17 g by mouth 2 (two) times daily. (Patient not taking: Reported on 11/09/2023) 14 each 0    prochlorperazine  (COMPAZINE ) 10 MG tablet Take 1 tablet by mouth every 6 hours as needed (Nausea or vomiting). 30 tablet 1    No current facility-administered medications for this encounter.   ROS  Review of Systems  Gastrointestinal:        Colostomy with prolapsed stoma Parastomal hernia   Skin:  Positive for color change.  Psychiatric/Behavioral:  Positive for dysphoric mood.   All other systems reviewed and are negative.  Vital signs:  BP 129/66 (BP Location: Right Arm)   Temp 97.9 F (36.6 C) (Oral)   Resp 20   SpO2 99%  Exam:  Physical Exam Vitals reviewed.  Constitutional:      Appearance: Normal appearance.  HENT:     Mouth/Throat:      Mouth: Mucous membranes are moist.  Cardiovascular:     Rate and Rhythm: Normal rate.  Pulmonary:     Effort: Pulmonary effort is normal.  Abdominal:     Palpations: Abdomen is soft.     Hernia: A hernia is present.  Musculoskeletal:        General: Normal range of motion.  Skin:    General: Skin is warm and dry.     Findings: Erythema present.  Neurological:     General: No focal deficit present.     Mental Status: She is alert and oriented to person, place, and time. Mental status is at baseline.     Stoma type/location:  LMQ colostomy Stomal assessment/size:  2 1/4 prolapse of afferent limb Peristomal assessment:  Erythema, intact Treatment options for stomal/peristomal skin: stoma powder and skin prep  barrier ring and 2 piece flexible convex  Output: soft brown stool  Ostomy pouching: 2pc. Flexible convex   Education provided:  Switching between 4 inch pouch and 2 piece flexible (both coloplast) depending on stoma size.     Impression/dx  Colostomy complication Parastomal hernia Discussion  See back as needed.   Update [facility] order to include 2 piece flexible product  Plan  See back as needed     Visit time: 40  minutes.   Darice Cooley FNP-BC

## 2024-02-01 ENCOUNTER — Ambulatory Visit: Payer: Self-pay | Attending: Nurse Practitioner

## 2024-02-01 DIAGNOSIS — Z515 Encounter for palliative care: Secondary | ICD-10-CM | POA: Diagnosis present

## 2024-02-01 DIAGNOSIS — I890 Lymphedema, not elsewhere classified: Secondary | ICD-10-CM | POA: Insufficient documentation

## 2024-02-01 DIAGNOSIS — R530 Neoplastic (malignant) related fatigue: Secondary | ICD-10-CM | POA: Insufficient documentation

## 2024-02-01 DIAGNOSIS — R2689 Other abnormalities of gait and mobility: Secondary | ICD-10-CM | POA: Insufficient documentation

## 2024-02-01 DIAGNOSIS — C7960 Secondary malignant neoplasm of unspecified ovary: Secondary | ICD-10-CM | POA: Diagnosis present

## 2024-02-01 NOTE — Therapy (Signed)
 OUTPATIENT PHYSICAL THERAPY  LOWER EXTREMITY ONCOLOGY EVALUATION  Patient Name: Stacy Rivas MRN: [id number] DOB:February 20, 1956, 68 y.o., female Today's Date: 02/01/2024  END OF SESSION:  PT End of Session - 02/01/24 1401     Visit Number 7    Number of Visits 9    Date for PT Re-Evaluation 02/06/24    Authorization - Visit Number 4    Authorization - Number of Visits 6    PT Start Time 1401    PT Stop Time 1501    PT Time Calculation (min) 60 min    Activity Tolerance Patient tolerated treatment well    Behavior During Therapy WFL for tasks assessed/performed          Past Medical History:  Diagnosis Date   Anxiety state, unspecified    Diverticulosis    Esophageal reflux    Family history of breast cancer    Family history of lymphoma    Family history of stomach cancer    Family history of uterine cancer    Fibroid tumor    Headache    due to allergies    Hiatal hernia    Hyperlipidemia    IBS (irritable bowel syndrome)    Internal hemorrhoids    Nonspecific elevation of levels of transaminase or lactic acid dehydrogenase (LDH)    ovarian ca dx'd 11/2018   Renal stone 07/2013   Tubulovillous adenoma of colon    Varicose veins    superficail thrombophlebitis (left LE)   Past Surgical History:  Procedure Laterality Date   CATARACT EXTRACTION, BILATERAL     cystoscopy with instillation for cystitis  2001   DEBULKING N/A 02/12/2019   Procedure: DEBULKING OF TUMOR;  Surgeon: Eloy Herring, MD;  Location: [facility];  Service: Gynecology;  Laterality: N/A;   DILATION AND CURETTAGE OF UTERUS     miscarriage   HYSTERECTOMY ABDOMINAL WITH SALPINGO-OOPHORECTOMY Bilateral 02/12/2019   Procedure: HYSTERECTOMY ABDOMINAL WITH SALPINGO-OOPHORECTOMY;  Surgeon: Eloy Herring, MD;  Location: [facility];  Service: Gynecology;  Laterality: Bilateral;   IR IMAGING GUIDED PORT INSERTION  12/06/2018   IR IMAGING GUIDED PORT INSERTION  10/10/2019   IR PARACENTESIS  12/03/2018   IR PARACENTESIS   12/14/2018   IR REMOVAL TUN ACCESS W/ PORT W/O FL MOD SED  06/03/2019   LAPAROSCOPY     adhesions   LAPAROTOMY N/A 07/19/2023   Procedure: EXPLORATORY LAPAROTOMY, DIVERTING LOOP COLOSTOMY;  Surgeon: Curvin Deward MOULD, MD;  Location: [facility];  Service: General;  Laterality: N/A;   LITHOTRIPSY  07/2013   x 2   MYOMECTOMY  1992   OMENTECTOMY N/A 02/12/2019   Procedure: OMENTECTOMY;  Surgeon: Eloy Herring, MD;  Location: [facility];  Service: Gynecology;  Laterality: N/A;   PARACENTESIS  11/21/2018   abdominal , removed 3.1 Liters    Patient Active Problem List   Diagnosis Date Noted   Sigmoid colon mucosal prolapse 01/26/2024   Intestinal stoma prolapse (HCC) 11/15/2023   Parastomal hernia without obstruction or gangrene 10/19/2023   Bilateral hydronephrosis 09/28/2023   Bilateral inguinal hernia without obstruction or gangrene 09/27/2023   Peristomal dermatitis 09/19/2023   Hypomagnesemia 09/14/2023   Atrophic vaginitis 08/24/2023   Acute deep vein thrombosis (DVT) of right lower extremity (HCC) 08/18/2023   UTI (urinary tract infection) 08/16/2023   Cancer associated pain 08/03/2023   Irritant contact dermatitis associated with fecal stoma 08/03/2023   Colostomy complication (HCC) 08/03/2023   Colostomy care (HCC) 07/22/2023   Malnutrition of moderate degree 07/19/2023  Hypokalemia 07/17/2023   Large bowel obstruction (HCC) 07/16/2023   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   Other constipation 07/11/2023   Hand foot syndrome 05/25/2023   COVID-19 virus infection 04/01/2022   Sinus infection 03/08/2022   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 02/01/2022   Nasal sore 01/20/2022   Pancytopenia, acquired (HCC) 01/18/2022   Essential hypertension 01/17/2022   Leukopenia due to antineoplastic chemotherapy (HCC) 10/21/2021   Abdominal pain 09/28/2021   Pelvic floor dysfunction in female 04/05/2021   Leg cramps 11/02/2020   Bilateral bunions 09/08/2020   Mucositis due to antineoplastic  therapy 08/04/2020   Anorexia 08/04/2020   Chemotherapy-induced fatigue 05/04/2020   Foot pain, bilateral 05/04/2020   Chemotherapy-induced nausea 03/09/2020   Vaginal dryness, menopausal 02/10/2020   Thrombocytopenia (HCC) 10/22/2019   Skin rash 10/14/2019   Foreign body reaction of the skin 07/02/2019   Genetic testing 04/25/2019   Hyperglycemia, drug-induced 04/01/2019   Elevated liver enzymes 04/01/2019   Family history of breast cancer    Family history of uterine cancer    Family history of lymphoma    Family history of stomach cancer    Preventive measure 03/05/2019   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   Anemia due to antineoplastic chemotherapy 01/17/2019   Peritoneal carcinomatosis (HCC) 11/30/2018   Goals of care, counseling/discussion 11/30/2018   Left ovarian epithelial cancer (HCC) 11/29/2018   Uterine leiomyoma 02/28/2017   Vitreous syneresis, bilateral 01/30/2017   Hyperopia with astigmatism and presbyopia, bilateral 01/30/2017   Dry eyes, bilateral 01/30/2017   Dermatochalasis of eyelids of both eyes 01/30/2017   Posterior capsular opacification of both eyes, obscuring vision 01/30/2017   FLATULENCE-GAS-BLOATING 05/29/2008   ABDOMINAL PAIN RIGHT UPPER QUADRANT 05/29/2008   ABNORMAL TRANSAMINASE-LFT'S 05/29/2008   Anxiety state 05/28/2008   GERD 05/28/2008   HIATAL HERNIA 05/28/2008   IBS 05/28/2008   Hiatal hernia 05/28/2008    PCP: Charlie Love, MD  REFERRING PROVIDER: Missouri Roys, NP  REFERRING DIAG:  Z51.5 (ICD-10-CM) - Palliative care patient  R53.0 (ICD-10-CM) - Neoplastic malignant related fatigue  C79.60 (ICD-10-CM) - 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 I think I would like to exercise today. I was tired after last time we exercised.Pt reports she practiced some of her balance exercises at home.   EVAL Pts right leg swelled from a DVT after her ostomy surgery on 07/19/2023  She has been on Elliquis for 3 months. Her leg has reduced considerably. She has been wearing a Thigh high compression stocking for about 2 months.  She feels she is lacking mobility in her right ankle. She gets some intermittent pins and needles in the anterior ankle, with some discomfort, stiffness and limited mobility. Swelling increases in the daytime and reduces at night. She does not think her thigh has really swollen any more. Pt is under palliative care because there is no further chemo for her, however, she wants to be the best that she can. She does note that she has mild neuropathy in her feet.   PERTINENT HISTORY:   68 y.o. 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 Palliative is seeing patient for symptom management and goals of care.  She had colostomy placed 07/19/2023 PAIN:  Are you having pain? No  PRECAUTIONS: Right LE swelling from DVT(on Elliquis),Peritoneal carcinomatosis,  RED FLAGS: Bowel or bladder incontinence: Yes: has ostomy pouch   WEIGHT BEARING RESTRICTIONS: No  FALLS:  Has patient fallen in last 6 months? No  LIVING ENVIRONMENT: Lives with: [facility] Lives in: House/apartment   OCCUPATION: on Medical leave from sales  LEISURE: read, walker but not since blood clot.  PRIOR LEVEL OF  FUNCTION: Independent  PATIENT GOALS: improve mobility right ankle,check swelling   OBJECTIVE: Note: Objective measures were completed at Evaluation unless otherwise noted.  COGNITION: Overall cognitive status: Within functional limits for tasks assessed   PALPATION: Mild pitting right foot and ankle, mild warmth right foot/ankle but has been in stocking  OBSERVATIONS / OTHER ASSESSMENTS: increased edema noted right foot and ankle, thigh despite wearing compression garment. Mild rubor right foot/toes greater than left but with good capillary refill. Does not appear infected  SENSATION: Light touch: Deficits      POSTURE: WFL  LOWER EXTREMITY STRENGTH:  ROM Right eval  Hip flexion   Hip extension   Hip abduction   Hip adduction   Hip internal rotation   Hip external rotation   Knee flexion   Knee extension   Ankle dorsiflexion 2  Ankle plantarflexion 50  Ankle inversion 24  Ankle eversion 12  Great toe extension    (Blank rows = not tested)  ROM LEFT eval  Hip flexion   Hip extension   Hip abduction   Hip adduction   Hip internal rotation   Hip external rotation   Knee flexion   Knee extension   Ankle dorsiflexion 5  Ankle plantarflexion 60  Ankle inversion 25  Ankle eversion 12  Great toe extension     (Blank rows = not tested)  Bilateral Ankle Strength:5/5   LYMPHEDEMA ASSESSMENTS:   SURGERY TYPE/DATE: Abdominal hysterectomy 02/12/2019, Colostomy 09/26/2023  NUMBER OF LYMPH NODES REMOVED: not aware of any  CHEMOTHERAPY: neoadjuvant  RADIATION:NO  HORMONE TREATMENT: NO  INFECTIONS: NO  LYMPHEDEMA ASSESSMENTS:   LOWER EXTREMITY LANDMARK RIGHT eval RIGHT 11/16/2023 RIGHT 11/28/2023 RIGHT 02/01/2024  At groin      30 cm proximal to suprapatella      20 cm proximal to suprapatella 55  53 52.8 55.4  10 cm proximal to suprapatella 45 45 44.5   At midpatella / popliteal crease 36  34.5 34.4  30 cm proximal to floor at lateral plantar foot 35.1   34.1 34.0  20 cm proximal to floor at lateral plantar foot 25.8 25.3 24.9   10 cm proximal to floor at lateral plantar foot 20.5 19.6 19.4 19.3  Circumference of ankle/heel 29  27.8   5 cm proximal to 1st MTP joint      Across MTP joint 20.3     Around proximal great toe 7.4  7.1   (Blank rows = not tested)  LOWER EXTREMITY LANDMARK LEFT eval  At groin   30 cm proximal to suprapatella   20 cm proximal to suprapatella 53  10 cm proximal to suprapatella 42.8  At midpatella / popliteal crease 35.6  30 cm proximal to floor at lateral plantar foot 34.4  20 cm proximal to floor at lateral plantar foot 25.4  10 cm proximal to floor at lateral plantar foot 19.7  Circumference of ankle/heel 28.3  5 cm proximal to 1st MTP joint   Across MTP joint 19.7  Around proximal great toe 6.9  (Blank rows = not tested)  FUNCTIONAL TESTS:  Balance screen; difficulty with tandem stance (less than 10 seconds with Right foot forward, SLS limited B to a few seconds  GAIT:Near normal. Mild decreased heel strike toe off                                                                                                                                TREATMENT DATE:  02/01/2024 Measured several places on right LE to check swelling since pt reports stocking is sliding down at the top. Discussed problem solving; washing more often, and checking to see if her stocking can go in the dryer for a short period of time. Also gave pt info for [employer] in [place] to consider  getting new stockings. Nu Step;seat 6, UE 8 x 8 min, Lev 5, 519 steps Incline stretch x 3 sidestepping with yellow band above knees x 6 lengths standing march x 10, no HH Standing heel raises x 10 no HH Airex beam x 6 lengths forward and 6 lengths sideways with only intermittent hand touch for balance SLS on ax x 2 B to failure   01/18/2024 Marching in bars without HH x 10 ea Heel raises x 10 Sidestepping 6 lengths no hands Ax beam x 6 lengths  forward, 4 lengths sideways with core stab SLS x 3 ea to failure Nu Step;seat 6, UE 8 x 7 min, Lev 5 Tandem stance B to failure SLS Cone touches to failure with cone on counter x 5 ea Standing postural exercises; scapular retraction, shoulder ext, Bilateral ER with yellow x 10. Education on erect posture throughout Also tried SLS with scap retraction to failure x 2 ea.   01/11/2024  MLD first by therapist and  then repeated each step with pt performing;short neck, Right axillary and inguinal LN's, Right inguino-axillary pathway, Right outer thigh, medial thigh to lateral, and lateral thigh again repeating pathway, then right Lower leg both sides and foot, retracing pathways and ending with LN's. Pt practiced  all techniques and did very well overall with occasional tactile cues for stretch and pressure, and VC's prn. Tried varius positions of her leg to make techniques easier for her.  12/21/2023 MLD first by therapist and then repeated each step with pt performing;short neck, Right axillary and inguinal LN's, Right inguino-axillary pathway, Right outer thigh, medial thigh to lateral, and lateral thigh again repeating pathway, then right Lower leg both sides and foot, retracing pathways and ending with LN's. Pt instructed in all techniques and did very well overall with tactile cues for stretch and pressure, and VC's prn. Pt had a good understanding of sequence.  11/28/2023 Pt circumferences measured with good decrease in edema noted especially in ankle and around achilles, and lower leg doing very well. Right thigh and lateral hip with greatest edema. Discussed compression shorts as an option, but pt may have difficulty due to Colostomy. Educated pt in role of MLD with swelling, superficial nature, importance of stretch. therapist performed  MLD first ;short neck, Right axillary and inguinal LN's, Right inguino-axillary pathway, Right outer thigh, medial thigh to lateral, and lateral thigh again  repeating pathway, then right Lower leg both sides and foot, retracing pathways and ending with LN's. Pt instructed in all techniques and did very well overall with tactile cues for stretch and pressure, and VC's prn. Pt had a good understanding of sequence. Gave pt written handout.   11/16/2023 Checked stocking and remeasured leg in several places; good reduction in several places with stronger stocking. Foot aspect of stocking feels a little loose, but pt will wash and try to renew the stretch there. Incline stretch x 3 runners stretch x 2 ea 30 sec holds HS stretch with strap 3 x 30 each Half tandem and full tandem stance B x 2 ea to failure Vector reaches B 2 sets of 5 with foot touch, 2 sets of 5 no touch between Supine clam red x 15  Bridging x 10 Updated HEP with pictures 619/2025 Measured circumference of bilateral LE's and ROM/strength of Bilateral ankles. Pt noted to have mild increased edema throughout Right LE but most limiting for her in ankle ROM for DR and PF. She has good capillary refill, but noted rubor right foot greater than left with mild pitting edema at the ankle. She is wearing a 15-20 mm thigh high compression stocking. We discussed several options; a 20-30 compression stocking to try and prevent ankle swelling so she would have improved ROM, or a knee high stocking over her present stocking to increase compression at the foot and ankle. She is going to return to [facility] to see about getting a slightly stronger stocking.  She has also been encouraged to elevate her legs as much as possible when sitting.She does have mild neuropathy which may be affecting her balance. She will benefit from education in foot and ankle ROM, gastroc stretching, and balance activities for a HEP. We have set up 1 appt per week for 2 weeks.    PATIENT EDUCATION:  Access Code: [email or web address] URL: [email or web address] Date: 11/16/2023 Prepared by: Grayce Sheldon  Exercises - Supine  Bridge  - 1 x daily - 7 x weekly - 1 sets - 10 reps - 5 hold - Hooklying Clamshell  with Resistance  - 1 x daily - 7 x weekly - 1 sets - 10-15 reps - 3 hold - Supine Hamstring Stretch with Strap  - 1 x daily - 7 x weekly - 1 sets - 3 reps - 30 hold - Gastroc Stretch on Wall  - 1 x daily - 7 x weekly - 1 sets - 3 reps - 30 hold - Long Sitting Calf Stretch with Strap  - 1 x daily - 7 x weekly - 1 sets - 3 reps - 30 hold - Standing Gastroc Stretch on Step with Counter Support  - 1 x daily - 7 x weekly - 1 sets - 3 reps - 30 hold - Single Leg Balance  - 1 x daily - 7 x weekly - 1 sets - 5 reps    Education details: see 11/08/2023 Person educated: Patient Education method: Explanation Education comprehension: verbalized understanding  HOME EXERCISE PROGRAM:   ASSESSMENT:  CLINICAL IMPRESSION:  Pt did an excellent job with balance today and had no LOB with standing march and heel raises. She also showed excellent improvement in balance on the airex beam with only occasional use of hands. Pt will consider calling [facility] re; stockings and will wash her stockings every day to see if that helps them stay up.  EVAL Patient is a 68 y.o. female who was seen today for physical therapy evaluation and treatment for complaints of right ankle swelling and limitations in ROM since she had a DVT several months ago.. Pt noted to have mild increased edema throughout Right LE but most limiting for her in ankle ROM for DR and PF. She has good capillary refill, but noted rubor right foot greater than left with mild pitting edema at the ankle. She is wearing a 15-20 mm thigh high compression stocking. We discussed several options; a 20-30 compression stocking to try and prevent ankle swelling so she would have improved ROM, or a knee high stocking over her present stocking to increase compression at the foot and ankle. She is going to return to [facility] to see about getting a slightly stronger stocking.  She has also  been encouraged to elevate her legs as much as possible when sitting.She does have mild neuropathy which may be affecting her balance. She will benefit from education in foot and ankle ROM, gastroc stretching, and balance activities for a HEP. We have set up 1 appt per week for 2 weeks.   OBJECTIVE IMPAIRMENTS: decreased balance, decreased knowledge of condition, decreased mobility, and increased edema.   ACTIVITY LIMITATIONS: sitting, standing, and locomotion level  PARTICIPATION LIMITATIONS: pt is able to participate in all but with increased swelling  PERSONAL FACTORS: 3+ comorbidities: left ovarian cancer with peritoneal carcinomatosis with no further chemo available are also affecting patient's functional outcome.   REHAB POTENTIAL: Good  CLINICAL DECISION MAKING: Evolving/moderate complexity  EVALUATION COMPLEXITY: Moderate   GOALS: Goals reviewed with patient? Yes  SHORT TERM GOALS=LONG TERM GOALS: Target date: 12/07/2023  Pt will have decreased right ankle swelling/ limitations with 20-30 compression garment Baseline: Goal status: MET 11/28/2023 2.  Pt will be independent with a HEP for ankle ROM and balance Baseline:  Goal status: In PROGRESS  3. Pt will be independent with MLD to the right LE to decrease swelling  Baseline: no knowledge  NEW PLAN:  PT FREQUENCY: 1x/weekprn for 4- 6 visits prn ( to return on 7/31) to give pt time to practice  PT DURATION: 10 weeks  PLANNED INTERVENTIONS:  02835- PT Re-evaluation, 97110-Therapeutic exercises, [id number]- Neuromuscular re-education, 97535- Self Care, [id number]- Manual therapy, [phone]- Gait training, [phone]- Orthotic Initial, Therapeutic exercises, Therapeutic activity, Neuromuscular re-education, Gait training, and Self Care  PLAN FOR NEXT SESSION: DC/recert?Review MLD, Nu Step?,Instruct gastroc stretch, balance exs; vector reach, SLS, tandem stance. Instruct HEP.    Grayce JINNY Sheldon, PT 02/01/2024, 5:37 PM

## 2024-02-02 ENCOUNTER — Ambulatory Visit (HOSPITAL_COMMUNITY)
Admission: RE | Admit: 2024-02-02 | Discharge: 2024-02-02 | Disposition: A | Discharge status: Home / Self Care | Source: Ambulatory Visit | Attending: Plastic Surgery | Admitting: Plastic Surgery

## 2024-02-02 DIAGNOSIS — C482 Malignant neoplasm of peritoneum, unspecified: Secondary | ICD-10-CM | POA: Diagnosis not present

## 2024-02-02 DIAGNOSIS — Z7901 Long term (current) use of anticoagulants: Secondary | ICD-10-CM | POA: Diagnosis not present

## 2024-02-02 DIAGNOSIS — Z433 Encounter for attention to colostomy: Secondary | ICD-10-CM | POA: Insufficient documentation

## 2024-02-02 DIAGNOSIS — K9400 Colostomy complication, unspecified: Secondary | ICD-10-CM | POA: Diagnosis not present

## 2024-02-02 DIAGNOSIS — K435 Parastomal hernia without obstruction or  gangrene: Secondary | ICD-10-CM

## 2024-02-02 NOTE — Progress Notes (Signed)
 [facility]   Reason for visit:  LLQ colostomy, prolapsed with parastomal hernia  HPI:  Peritoneal carcinomatoma with loop colostomy Past Medical History:  Diagnosis Date   Anxiety state, unspecified    Diverticulosis    Esophageal reflux    Family history of breast cancer    Family history of lymphoma    Family history of stomach cancer    Family history of uterine cancer    Fibroid tumor    Headache    due to allergies    Hiatal hernia    Hyperlipidemia    IBS (irritable bowel syndrome)    Internal hemorrhoids    Nonspecific elevation of levels of transaminase or lactic acid dehydrogenase (LDH)    ovarian ca dx'd 11/2018   Renal stone 07/2013   Tubulovillous adenoma of colon    Varicose veins    superficail thrombophlebitis (left LE)   Family History  Problem Relation Age of Onset   Lymphoma Father        dx 2s   Heart disease Father    Hypertension Father    Hypertension Mother    Hyperlipidemia Mother    Breast cancer Mother 60   Diabetes Maternal Grandmother    Uterine cancer Maternal Grandmother        dx 86s   Stomach cancer Paternal Grandfather    Breast cancer Other        grandmother's sisters, both dx 4s   Colon cancer Neg Hx    Allergies  Allergen Reactions   Carboplatin  Other (See Comments)    Chest tightness/burning, flushing.  See Progress Note from 12/29/2022.   Skin Adhesives [Cyanoacrylate] Hives   Shrimp [Shellfish Allergy] Itching   Sulfasalazine Nausea And Vomiting   Sulfonamide Derivatives Nausea Only   Telithromycin     Other Reaction(s): severe dizziness   Current Outpatient Medications  Medication Sig Dispense Refill Last Dose/Taking   acetaminophen  (TYLENOL ) 500 MG tablet Take 500 mg by mouth every 6 (six) hours as needed for mild pain (pain score 1-3) or moderate pain (pain score 4-6).      ALPRAZolam  (XANAX ) 0.25 MG tablet Take 1 tablet (0.25 mg total) by mouth 2 (two) times daily as needed for anxiety or sleep. 60  tablet 0    apixaban  (ELIQUIS ) 5 MG TABS tablet Take 1 tablet (5 mg total) by mouth 2 (two) times daily. 60 tablet 0    Cholecalciferol (VITAMIN D3) 25 MCG (1000 UT) CAPS Take 1,000 Units by mouth daily.       esomeprazole (NEXIUM) 20 MG capsule Take 20 mg by mouth daily.       estradiol  (ESTRACE  VAGINAL) 0.1 MG/GM vaginal cream Place 1 applicatorful vaginally at bedtime as directed 42.5 g 12    famotidine  (PEPCID ) 20 MG tablet Take 20 mg by mouth as needed for heartburn or indigestion.      lidocaine -prilocaine  (EMLA ) cream Apply topically as needed. 30 g 1    magnesium  oxide (MAG-OX) 400 (240 Mg) MG tablet Take 1 tablet (400 mg total) by mouth daily. 30 tablet 1    Multiple Vitamins-Minerals (MULTI FOR HER PO) Take 1 tablet by mouth daily.      ondansetron  (ZOFRAN -ODT) 4 MG disintegrating tablet Take 1 tablet (4 mg total) by mouth every 8 (eight) hours as needed for nausea or vomiting. 20 tablet 0    oxyCODONE  (OXY IR/ROXICODONE ) 5 MG immediate release tablet Take 1 tablet (5 mg total) by mouth every 6 (six) hours as  needed for severe pain (pain score 7-10) or moderate pain (pain score 4-6). 90 tablet 0    polyethylene glycol (MIRALAX  / GLYCOLAX ) 17 g packet Take 17 g by mouth 2 (two) times daily. (Patient not taking: Reported on 11/09/2023) 14 each 0    prochlorperazine  (COMPAZINE ) 10 MG tablet Take 1 tablet by mouth every 6 hours as needed (Nausea or vomiting). 30 tablet 1    No current facility-administered medications for this encounter.   ROS  Review of Systems  Constitutional: Negative.   Gastrointestinal:        LLQ colostomy with parastomal hernia   Skin:  Positive for color change.  Hematological:  Bruises/bleeds easily.  Psychiatric/Behavioral: Negative.    All other systems reviewed and are negative.  Vital signs:  BP 132/72 (BP Location: Right Arm)   Pulse 74   Temp 98.3 F (36.8 C) (Oral)   Resp 18   SpO2 99%  Exam:  Physical Exam Vitals reviewed.  Constitutional:       Appearance: Normal appearance.  HENT:     Mouth/Throat:     Mouth: Mucous membranes are moist.  Cardiovascular:     Rate and Rhythm: Normal rate.     Pulses: Normal pulses.  Pulmonary:     Effort: Pulmonary effort is normal.  Abdominal:     Hernia: A hernia is present.  Musculoskeletal:        General: Normal range of motion.  Skin:    General: Skin is warm and dry.  Neurological:     Mental Status: She is alert.     Stoma type/location:  LLQ colostomy Stomal assessment/size:  2 1/4, slightly oval Peristomal assessment:  peristomal bulging consistent with parastomal hernia Treatment options for stomal/peristomal skin: stoma powder and skin prep, barrier ring and coloplast 2 piece flexible pouch  Output: soft brown stool  Ostomy pouching:2pc.  Education provided:  No changes, wears 4 inch 1 piece flat pouch if stoma is too large for the 2 piece,     Impression/dx  Parastomal hernia  Colostomy complication  Discussion  COntinue to use 4 inch or 2 piece flexible pouches    Plan  See back as needed.     Visit time: 45 minutes.   Darice Cooley FNP-BC

## 2024-02-05 ENCOUNTER — Ambulatory Visit: Payer: Self-pay

## 2024-02-05 DIAGNOSIS — I890 Lymphedema, not elsewhere classified: Secondary | ICD-10-CM

## 2024-02-05 DIAGNOSIS — Z515 Encounter for palliative care: Secondary | ICD-10-CM

## 2024-02-05 DIAGNOSIS — R530 Neoplastic (malignant) related fatigue: Secondary | ICD-10-CM

## 2024-02-05 DIAGNOSIS — C7960 Secondary malignant neoplasm of unspecified ovary: Secondary | ICD-10-CM

## 2024-02-05 DIAGNOSIS — R2689 Other abnormalities of gait and mobility: Secondary | ICD-10-CM

## 2024-02-05 NOTE — Therapy (Signed)
 OUTPATIENT PHYSICAL THERAPY  LOWER EXTREMITY ONCOLOGY EVALUATION  Patient Name: Stacy Rivas MRN: [id number] DOB:[birth year], 68 y.o., female Today's Date: 02/05/2024  END OF SESSION:  PT End of Session - 02/05/24 0904     Visit Number 8    Number of Visits 9    Date for PT Re-Evaluation 02/06/24    Authorization - Visit Number 5    Authorization - Number of Visits 6    PT Start Time 0904    PT Stop Time 0957    PT Time Calculation (min) 53 min    Activity Tolerance Patient tolerated treatment well    Behavior During Therapy [facility] for tasks assessed/performed          Past Medical History:  Diagnosis Date   Anxiety state, unspecified    Diverticulosis    Esophageal reflux    Family history of breast cancer    Family history of lymphoma    Family history of stomach cancer    Family history of uterine cancer    Fibroid tumor    Headache    due to allergies    Hiatal hernia    Hyperlipidemia    IBS (irritable bowel syndrome)    Internal hemorrhoids    Nonspecific elevation of levels of transaminase or lactic acid dehydrogenase (LDH)    ovarian ca dx'd 11/2018   Renal stone 07/2013   Tubulovillous adenoma of colon    Varicose veins    superficail thrombophlebitis (left LE)   Past Surgical History:  Procedure Laterality Date   CATARACT EXTRACTION, BILATERAL     cystoscopy with instillation for cystitis  2001   DEBULKING N/A 02/12/2019   Procedure: DEBULKING OF TUMOR;  Surgeon: Eloy Herring, MD;  Location: [facility];  Service: Gynecology;  Laterality: N/A;   DILATION AND CURETTAGE OF UTERUS     miscarriage   HYSTERECTOMY ABDOMINAL WITH SALPINGO-OOPHORECTOMY Bilateral 02/12/2019   Procedure: HYSTERECTOMY ABDOMINAL WITH SALPINGO-OOPHORECTOMY;  Surgeon: Eloy Herring, MD;  Location: [facility];  Service: Gynecology;  Laterality: Bilateral;   IR IMAGING GUIDED PORT INSERTION  12/06/2018   IR IMAGING GUIDED PORT INSERTION  10/10/2019   IR PARACENTESIS  12/03/2018   IR PARACENTESIS   12/14/2018   IR REMOVAL TUN ACCESS W/ PORT W/O FL MOD SED  06/03/2019   LAPAROSCOPY     adhesions   LAPAROTOMY N/A 07/19/2023   Procedure: EXPLORATORY LAPAROTOMY, DIVERTING LOOP COLOSTOMY;  Surgeon: Curvin Deward MOULD, MD;  Location: [facility];  Service: General;  Laterality: N/A;   LITHOTRIPSY  07/2013   x 2   MYOMECTOMY  1992   OMENTECTOMY N/A 02/12/2019   Procedure: OMENTECTOMY;  Surgeon: Eloy Herring, MD;  Location: [facility];  Service: Gynecology;  Laterality: N/A;   PARACENTESIS  11/21/2018   abdominal , removed 3.1 Liters    Patient Active Problem List   Diagnosis Date Noted   Sigmoid colon mucosal prolapse 01/26/2024   Intestinal stoma prolapse (HCC) 11/15/2023   Parastomal hernia without obstruction or gangrene 10/19/2023   Bilateral hydronephrosis 09/28/2023   Bilateral inguinal hernia without obstruction or gangrene 09/27/2023   Peristomal dermatitis 09/19/2023   Hypomagnesemia 09/14/2023   Atrophic vaginitis 08/24/2023   Acute deep vein thrombosis (DVT) of right lower extremity (HCC) 08/18/2023   UTI (urinary tract infection) 08/16/2023   Cancer associated pain 08/03/2023   Irritant contact dermatitis associated with fecal stoma 08/03/2023   Colostomy complication (HCC) 08/03/2023   Colostomy care (HCC) 07/22/2023   Malnutrition of moderate degree 07/19/2023  Hypokalemia 07/17/2023   Large bowel obstruction (HCC) 07/16/2023   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   Other constipation 07/11/2023   Hand foot syndrome 05/25/2023   COVID-19 virus infection 04/01/2022   Sinus infection 03/08/2022   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 02/01/2022   Nasal sore 01/20/2022   Pancytopenia, acquired (HCC) 01/18/2022   Essential hypertension 01/17/2022   Leukopenia due to antineoplastic chemotherapy (HCC) 10/21/2021   Abdominal pain 09/28/2021   Pelvic floor dysfunction in female 04/05/2021   Leg cramps 11/02/2020   Bilateral bunions 09/08/2020   Mucositis due to antineoplastic  therapy 08/04/2020   Anorexia 08/04/2020   Chemotherapy-induced fatigue 05/04/2020   Foot pain, bilateral 05/04/2020   Chemotherapy-induced nausea 03/09/2020   Vaginal dryness, menopausal 02/10/2020   Thrombocytopenia (HCC) 10/22/2019   Skin rash 10/14/2019   Foreign body reaction of the skin 07/02/2019   Genetic testing 04/25/2019   Hyperglycemia, drug-induced 04/01/2019   Elevated liver enzymes 04/01/2019   Family history of breast cancer    Family history of uterine cancer    Family history of lymphoma    Family history of stomach cancer    Preventive measure 03/05/2019   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   Anemia due to antineoplastic chemotherapy 01/17/2019   Peritoneal carcinomatosis (HCC) 11/30/2018   Goals of care, counseling/discussion 11/30/2018   Left ovarian epithelial cancer (HCC) 11/29/2018   Uterine leiomyoma 02/28/2017   Vitreous syneresis, bilateral 01/30/2017   Hyperopia with astigmatism and presbyopia, bilateral 01/30/2017   Dry eyes, bilateral 01/30/2017   Dermatochalasis of eyelids of both eyes 01/30/2017   Posterior capsular opacification of both eyes, obscuring vision 01/30/2017   FLATULENCE-GAS-BLOATING 05/29/2008   ABDOMINAL PAIN RIGHT UPPER QUADRANT 05/29/2008   ABNORMAL TRANSAMINASE-LFT'S 05/29/2008   Anxiety state 05/28/2008   GERD 05/28/2008   HIATAL HERNIA 05/28/2008   IBS 05/28/2008   Hiatal hernia 05/28/2008    PCP: Charlie Love, MD  REFERRING PROVIDER: Missouri Roys, NP  REFERRING DIAG:  Z51.5 (ICD-10-CM) - Palliative care patient  R53.0 (ICD-10-CM) - Neoplastic malignant related fatigue  C79.60 (ICD-10-CM) - 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 I don't have as much energy this am. I put on my compression stocking early this am after washing it yesterday. It feels pretty snug at the top now.   EVAL Pts right leg swelled from a DVT after her ostomy surgery on 07/19/2023  She has been on Elliquis for 3 months. Her leg has reduced considerably. She has been wearing a Thigh high compression stocking for about 2 months.  She feels she is lacking mobility in her right ankle. She gets some intermittent pins and needles in the anterior ankle, with some discomfort, stiffness and limited mobility. Swelling increases in the daytime and reduces at night. She does not think her thigh has really swollen any more. Pt is under palliative care because there is no further chemo for her, however, she wants to be the best that she can. She does note that she has mild neuropathy in her feet.   PERTINENT HISTORY:   68 y.o. 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 Palliative is seeing patient for symptom management and goals of care.  She had colostomy placed 07/19/2023 PAIN:  Are you having pain? No  PRECAUTIONS: Right LE swelling from DVT(on Elliquis),Peritoneal carcinomatosis,  RED FLAGS: Bowel or bladder incontinence: Yes: has ostomy pouch   WEIGHT BEARING RESTRICTIONS: No  FALLS:  Has patient fallen in last 6 months? No  LIVING ENVIRONMENT: Lives with: [facility] Lives in: House/apartment   OCCUPATION: on Medical leave from sales  LEISURE: read, walker but not since blood clot.  PRIOR LEVEL OF FUNCTION:  Independent  PATIENT GOALS: improve mobility right ankle,check swelling   OBJECTIVE: Note: Objective measures were completed at Evaluation unless otherwise noted.  COGNITION: Overall cognitive status: Within functional limits for tasks assessed   PALPATION: Mild pitting right foot and ankle, mild warmth right foot/ankle but has been in stocking  OBSERVATIONS / OTHER ASSESSMENTS: increased edema noted right foot and ankle, thigh despite wearing compression garment. Mild rubor right foot/toes greater than left but with good capillary refill. Does not appear infected  SENSATION: Light touch: Deficits      POSTURE: WFL  LOWER EXTREMITY STRENGTH:  ROM Right eval  Hip flexion   Hip extension   Hip abduction   Hip adduction   Hip internal rotation   Hip external rotation   Knee flexion   Knee extension   Ankle dorsiflexion 2  Ankle plantarflexion 50  Ankle inversion 24  Ankle eversion 12  Great toe extension    (Blank rows = not tested)  ROM LEFT eval  Hip flexion   Hip extension   Hip abduction   Hip adduction   Hip internal rotation   Hip external rotation   Knee flexion   Knee extension   Ankle dorsiflexion 5  Ankle plantarflexion 60  Ankle inversion 25  Ankle eversion 12  Great toe extension     (Blank rows = not tested)  Bilateral Ankle Strength:5/5   LYMPHEDEMA ASSESSMENTS:   SURGERY TYPE/DATE: Abdominal hysterectomy 02/12/2019, Colostomy 09/26/2023  NUMBER OF LYMPH NODES REMOVED: not aware of any  CHEMOTHERAPY: neoadjuvant  RADIATION:NO  HORMONE TREATMENT: NO  INFECTIONS: NO  LYMPHEDEMA ASSESSMENTS:   LOWER EXTREMITY LANDMARK RIGHT eval RIGHT 11/16/2023 RIGHT 11/28/2023 RIGHT 02/01/2024  At groin      30 cm proximal to suprapatella      20 cm proximal to suprapatella 55 53  52.8 55.4  10 cm proximal to suprapatella 45 45 44.5   At midpatella / popliteal crease 36  34.5 34.4  30 cm proximal to floor at lateral plantar foot 35.1  34.1 34.0   20 cm proximal to floor at lateral plantar foot 25.8 25.3 24.9   10 cm proximal to floor at lateral plantar foot 20.5 19.6 19.4 19.3  Circumference of ankle/heel 29  27.8   5 cm proximal to 1st MTP joint      Across MTP joint 20.3     Around proximal great toe 7.4  7.1   (Blank rows = not tested)  LOWER EXTREMITY LANDMARK LEFT eval  At groin   30 cm proximal to suprapatella   20 cm proximal to suprapatella 53  10 cm proximal to suprapatella 42.8  At midpatella / popliteal crease 35.6  30 cm proximal to floor at lateral plantar foot 34.4  20 cm proximal to floor at lateral plantar foot 25.4  10 cm proximal to floor at lateral plantar foot 19.7  Circumference of ankle/heel 28.3  5 cm proximal to 1st MTP joint   Across MTP joint 19.7  Around proximal great toe 6.9  (Blank rows = not tested)  FUNCTIONAL TESTS:  Balance screen; difficulty with tandem stance (less than 10 seconds with Right foot forward, SLS limited B to a few seconds  GAIT:Near normal. Mild decreased heel strike toe off                                                                                                                                TREATMENT DATE:  02/05/2024 MLD first by therapist and then repeated each step with pt performing;short neck, 5 Breathss Right axillary and inguinal LN's, Right inguino-axillary pathway, Right outer thigh, medial thigh to lateral, and lateral thigh again repeating pathway, then right knee,  Lower leg both sides and foot, retracing pathways and ending with LN's. Pt practiced  all techniques and did very well overall with occasional VC's prn. Tried varius positions of her leg to make techniques easier for her. Pt is now independent with MLD Reviewed goals for DC   02/01/2024 Measured several places on right LE to check swelling since pt reports stocking is sliding down at the top. Discussed problem solving; washing more often, and checking to see if her stocking can go in the  dryer for a short period of time. Also gave pt info for ETI in [place] to consider  getting new stockings. Nu Step;seat 6, UE 8 x 8 min, Lev 5, 519 steps Incline stretch x 3 sidestepping with yellow band above knees x 6 lengths standing march x 10, no HH Standing heel raises x 10 no HH Airex beam x 6 lengths forward and 6 lengths sideways with only intermittent hand touch for balance SLS on ax x 2 B to failure   01/18/2024 Marching in bars without HH x 10 ea Heel raises  x 10 Sidestepping 6 lengths no hands Ax beam x 6 lengths forward, 4 lengths sideways with core stab SLS x 3 ea to failure Nu Step;seat 6, UE 8 x 7 min, Lev 5 Tandem stance B to failure SLS Cone touches to failure with cone on counter x 5 ea Standing postural exercises; scapular retraction, shoulder ext, Bilateral ER with yellow x 10. Education on erect posture throughout Also tried SLS with scap retraction to failure x 2 ea.   01/11/2024  MLD first by therapist and then repeated each step with pt performing;short neck, Right axillary and inguinal LN's, Right inguino-axillary pathway, Right outer thigh, medial thigh to lateral, and lateral thigh again repeating pathway, then right Lower leg both sides and foot, retracing pathways and ending with LN's. Pt practiced  all techniques and did very well overall with occasional tactile cues for stretch and pressure, and VC's prn. Tried varius positions of her leg to make techniques easier for her.     PATIENT EDUCATION:  Access Code: [email or web address] URL: [email or web address] Date: 11/16/2023 Prepared by: Grayce Sheldon  Exercises - Supine Bridge  - 1 x daily - 7 x weekly - 1 sets - 10 reps - 5 hold - Hooklying Clamshell with Resistance  - 1 x daily - 7 x weekly - 1 sets - 10-15 reps - 3 hold - Supine Hamstring Stretch with Strap  - 1 x daily - 7 x weekly - 1 sets - 3 reps - 30 hold - Gastroc Stretch on Wall  - 1 x daily - 7 x weekly - 1 sets - 3 reps - 30 hold -  Long Sitting Calf Stretch with Strap  - 1 x daily - 7 x weekly - 1 sets - 3 reps - 30 hold - Standing Gastroc Stretch on Step with Counter Support  - 1 x daily - 7 x weekly - 1 sets - 3 reps - 30 hold - Single Leg Balance  - 1 x daily - 7 x weekly - 1 sets - 5 reps    Education details: see 11/08/2023 Person educated: Patient Education method: Explanation Education comprehension: verbalized understanding  HOME EXERCISE PROGRAM:   ASSESSMENT:  CLINICAL IMPRESSION:  Pt has achieved all goals established at initial evaluation. She is independent with her HEP for ankle ROM and balance and she is independent with her Manual lymphatic drainage. She is trying to wash her garments(compression stockings) daily to get them snug enough at the top so they don't slide down. She was given information for ETI in [place] in case she needs new garments. She has had good response with decreased edema with her compression stockings. Pt is pleased with her progress and ready to be discharged to a HEP.   EVAL Patient is a 68 y.o. female who was seen today for physical therapy evaluation and treatment for complaints of right ankle swelling and limitations in ROM since she had a DVT several months ago.. Pt noted to have mild increased edema throughout Right LE but most limiting for her in ankle ROM for DR and PF. She has good capillary refill, but noted rubor right foot greater than left with mild pitting edema at the ankle. She is wearing a 15-20 mm thigh high compression stocking. We discussed several options; a 20-30 compression stocking to try and prevent ankle swelling so she would have improved ROM, or a knee high stocking over her present stocking to increase compression at the foot and ankle. She is going to return to  [facility] to see about getting a slightly stronger stocking.  She has also been encouraged to elevate her legs as much as possible when sitting.She does have mild neuropathy which may be  affecting her balance. She will benefit from education in foot and ankle ROM, gastroc stretching, and balance activities for a HEP. We have set up 1 appt per week for 2 weeks.   OBJECTIVE IMPAIRMENTS: decreased balance, decreased knowledge of condition, decreased mobility, and increased edema.   ACTIVITY LIMITATIONS: sitting, standing, and locomotion level  PARTICIPATION LIMITATIONS: pt is able to participate in all but with increased swelling  PERSONAL FACTORS: 3+ comorbidities: left ovarian cancer with peritoneal carcinomatosis with no further chemo available are also affecting patient's functional outcome.   REHAB POTENTIAL: Good  CLINICAL DECISION MAKING: Evolving/moderate complexity  EVALUATION COMPLEXITY: Moderate   GOALS: Goals reviewed with patient? Yes  SHORT TERM GOALS=LONG TERM GOALS: Target date: 12/07/2023  Pt will have decreased right ankle swelling/ limitations with 20-30 compression garment Baseline: Goal status: MET 11/28/2023 2.  Pt will be independent with a HEP for ankle ROM and balance Baseline:  Goal status: MET 3. Pt will be independent with MLD to the right LE to decrease swelling  Baseline: no knowledge  Goal Status:MET 02/05/2024  PLAN:  PT FREQUENCY: 1x/weekprn for 4- 6 visits prn ( to return on 7/31) to give pt time to practice  PT DURATION: 10 weeks  PLANNED INTERVENTIONS: 97164- PT Re-evaluation, 97110-Therapeutic exercises, 97112- Neuromuscular re-education, 97535- Self Care, [id number]- Manual therapy, 97116- Gait training, [phone]- Orthotic Initial, Therapeutic exercises, Therapeutic activity, Neuromuscular re-education, Gait training, and Self Care  PLAN FOR NEXT SESSION: DC to self MLD, compression stockings and HEP for ankle and balance.  PHYSICAL THERAPY DISCHARGE SUMMARY  Visits from Start of Care: 8  Current functional level related to goals / functional outcomes: Achieved goals   Remaining deficits: None   Education / Equipment: MLD,  HEP   Patient agrees to discharge. Patient goals were met. Patient is being discharged due to meeting the stated rehab goals.  Grayce JINNY Sheldon, PT 02/05/2024, 2:35 PM

## 2024-02-06 ENCOUNTER — Inpatient Hospital Stay

## 2024-02-06 DIAGNOSIS — K4090 Unilateral inguinal hernia, without obstruction or gangrene, not specified as recurrent: Secondary | ICD-10-CM | POA: Insufficient documentation

## 2024-02-12 ENCOUNTER — Ambulatory Visit (HOSPITAL_COMMUNITY)
Admission: RE | Admit: 2024-02-12 | Discharge: 2024-02-12 | Disposition: A | Discharge status: Home / Self Care | Source: Ambulatory Visit | Attending: Nurse Practitioner | Admitting: Nurse Practitioner

## 2024-02-12 DIAGNOSIS — R5383 Other fatigue: Secondary | ICD-10-CM | POA: Insufficient documentation

## 2024-02-12 DIAGNOSIS — Z933 Colostomy status: Secondary | ICD-10-CM | POA: Diagnosis present

## 2024-02-12 DIAGNOSIS — K9419 Other complications of enterostomy: Secondary | ICD-10-CM | POA: Diagnosis not present

## 2024-02-12 DIAGNOSIS — K435 Parastomal hernia without obstruction or  gangrene: Secondary | ICD-10-CM | POA: Insufficient documentation

## 2024-02-12 DIAGNOSIS — K9409 Other complications of colostomy: Secondary | ICD-10-CM | POA: Insufficient documentation

## 2024-02-12 DIAGNOSIS — Z86718 Personal history of other venous thrombosis and embolism: Secondary | ICD-10-CM | POA: Diagnosis not present

## 2024-02-12 DIAGNOSIS — K9400 Colostomy complication, unspecified: Secondary | ICD-10-CM | POA: Diagnosis not present

## 2024-02-12 NOTE — Progress Notes (Signed)
 [facility]   Reason for visit:  LLQ colostomy, prolapsed with parastomal hernia HPI:  Peritoneal carcinamatoma with loop colostomy Past Medical History:  Diagnosis Date   Anxiety state, unspecified    Diverticulosis    Esophageal reflux    Family history of breast cancer    Family history of lymphoma    Family history of stomach cancer    Family history of uterine cancer    Fibroid tumor    Headache    due to allergies    Hiatal hernia    Hyperlipidemia    IBS (irritable bowel syndrome)    Internal hemorrhoids    Nonspecific elevation of levels of transaminase or lactic acid dehydrogenase (LDH)    ovarian ca dx'd 11/2018   Renal stone 07/2013   Tubulovillous adenoma of colon    Varicose veins    superficail thrombophlebitis (left LE)   Family History  Problem Relation Age of Onset   Lymphoma Father        dx 63s   Heart disease Father    Hypertension Father    Hypertension Mother    Hyperlipidemia Mother    Breast cancer Mother 71   Diabetes Maternal Grandmother    Uterine cancer Maternal Grandmother        dx 66s   Stomach cancer Paternal Grandfather    Breast cancer Other        grandmother's sisters, both dx 25s   Colon cancer Neg Hx    Allergies  Allergen Reactions   Carboplatin  Other (See Comments)    Chest tightness/burning, flushing.  See Progress Note from 12/29/2022.   Skin Adhesives [Cyanoacrylate] Hives   Shrimp [Shellfish Allergy] Itching   Sulfasalazine Nausea And Vomiting   Sulfonamide Derivatives Nausea Only   Telithromycin     Other Reaction(s): severe dizziness   Current Outpatient Medications  Medication Sig Dispense Refill Last Dose/Taking   acetaminophen  (TYLENOL ) 500 MG tablet Take 500 mg by mouth every 6 (six) hours as needed for mild pain (pain score 1-3) or moderate pain (pain score 4-6).      ALPRAZolam  (XANAX ) 0.25 MG tablet Take 1 tablet (0.25 mg total) by mouth 2 (two) times daily as needed for anxiety or sleep. 60  tablet 0    apixaban  (ELIQUIS ) 5 MG TABS tablet Take 1 tablet (5 mg total) by mouth 2 (two) times daily. 60 tablet 0    Cholecalciferol (VITAMIN D3) 25 MCG (1000 UT) CAPS Take 1,000 Units by mouth daily.       esomeprazole (NEXIUM) 20 MG capsule Take 20 mg by mouth daily.       estradiol  (ESTRACE  VAGINAL) 0.1 MG/GM vaginal cream Place 1 applicatorful vaginally at bedtime as directed 42.5 g 12    famotidine  (PEPCID ) 20 MG tablet Take 20 mg by mouth as needed for heartburn or indigestion.      lidocaine -prilocaine  (EMLA ) cream Apply topically as needed. 30 g 1    magnesium  oxide (MAG-OX) 400 (240 Mg) MG tablet Take 1 tablet (400 mg total) by mouth daily. 30 tablet 1    Multiple Vitamins-Minerals (MULTI FOR HER PO) Take 1 tablet by mouth daily.      ondansetron  (ZOFRAN -ODT) 4 MG disintegrating tablet Take 1 tablet (4 mg total) by mouth every 8 (eight) hours as needed for nausea or vomiting. 20 tablet 0    oxyCODONE  (OXY IR/ROXICODONE ) 5 MG immediate release tablet Take 1 tablet (5 mg total) by mouth every 6 (six) hours as needed  for severe pain (pain score 7-10) or moderate pain (pain score 4-6). 90 tablet 0    polyethylene glycol (MIRALAX  / GLYCOLAX ) 17 g packet Take 17 g by mouth 2 (two) times daily. (Patient not taking: Reported on 11/09/2023) 14 each 0    prochlorperazine  (COMPAZINE ) 10 MG tablet Take 1 tablet by mouth every 6 hours as needed (Nausea or vomiting). 30 tablet 1    No current facility-administered medications for this encounter.   ROS  Review of Systems  Constitutional:  Positive for fatigue.  Cardiovascular:        Hx DVT  Gastrointestinal:        Peristomal hernia Prolapsed stoma  Musculoskeletal: Negative.   Skin: Negative.   Psychiatric/Behavioral: Negative.    All other systems reviewed and are negative.  Vital signs:  BP (!) 146/74   Pulse 77   Temp 97.7 F (36.5 C) (Oral)   Resp 18   SpO2 97%  Exam:  Physical Exam Vitals reviewed.  Constitutional:       Appearance: Normal appearance.  HENT:     Mouth/Throat:     Mouth: Mucous membranes are moist.  Cardiovascular:     Rate and Rhythm: Normal rate.  Pulmonary:     Effort: Pulmonary effort is normal.  Abdominal:     Hernia: A hernia is present.  Musculoskeletal:        General: Normal range of motion.  Skin:    General: Skin is warm and dry.  Neurological:     Mental Status: She is alert and oriented to person, place, and time. Mental status is at baseline.  Psychiatric:        Mood and Affect: Mood normal.        Behavior: Behavior normal.     Stoma type/location:  LLQ colostomy Stomal assessment/size:  2 1/4 slightly oval, prolapsed Peristomal assessment:  peristomal bulging Treatment options for stomal/peristomal skin: barrier ring and 2 piece flexible pouch, coloplast Output: soft brown stool  Ostomy pouching: 2pc. Soft brown stool  Education provided:  No changes    Impression/dx  Colostomy complication Prolapsed stoma Discussion  No changes Plan  See back as needed.     Visit time: 40 minutes.   Stacy Cooley FNP-BC

## 2024-02-12 NOTE — Discharge Instructions (Signed)
 Adhesvie remover Barrier strips  [id number]

## 2024-02-20 ENCOUNTER — Ambulatory Visit (HOSPITAL_COMMUNITY)
Admission: RE | Admit: 2024-02-20 | Discharge: 2024-02-20 | Disposition: A | Discharge status: Home / Self Care | Source: Ambulatory Visit | Attending: *Deleted | Admitting: *Deleted

## 2024-02-20 NOTE — Progress Notes (Signed)
 [facility]   Reason for visit:  LLQ prolapsed colostomy with parastomal hernia HPI:   Past Medical History:  Diagnosis Date   Anxiety state, unspecified    Diverticulosis    Esophageal reflux    Family history of breast cancer    Family history of lymphoma    Family history of stomach cancer    Family history of uterine cancer    Fibroid tumor    Headache    due to allergies    Hiatal hernia    Hyperlipidemia    IBS (irritable bowel syndrome)    Internal hemorrhoids    Nonspecific elevation of levels of transaminase or lactic acid dehydrogenase (LDH)    ovarian ca dx'd 11/2018   Renal stone 07/2013   Tubulovillous adenoma of colon    Varicose veins    superficail thrombophlebitis (left LE)   Family History  Problem Relation Age of Onset   Lymphoma Father        dx 28s   Heart disease Father    Hypertension Father    Hypertension Mother    Hyperlipidemia Mother    Breast cancer Mother 14   Diabetes Maternal Grandmother    Uterine cancer Maternal Grandmother        dx 62s   Stomach cancer Paternal Grandfather    Breast cancer Other        grandmother's sisters, both dx 46s   Colon cancer Neg Hx    Allergies  Allergen Reactions   Carboplatin  Other (See Comments)    Chest tightness/burning, flushing.  See Progress Note from 12/29/2022.   Skin Adhesives [Cyanoacrylate] Hives   Shrimp [Shellfish Allergy] Itching   Sulfasalazine Nausea And Vomiting   Sulfonamide Derivatives Nausea Only   Telithromycin     Other Reaction(s): severe dizziness   Current Outpatient Medications  Medication Sig Dispense Refill Last Dose/Taking   acetaminophen  (TYLENOL ) 500 MG tablet Take 500 mg by mouth every 6 (six) hours as needed for mild pain (pain score 1-3) or moderate pain (pain score 4-6).      ALPRAZolam  (XANAX ) 0.25 MG tablet Take 1 tablet (0.25 mg total) by mouth 2 (two) times daily as needed for anxiety or sleep. 60 tablet 0    apixaban  (ELIQUIS ) 5 MG TABS tablet  Take 1 tablet (5 mg total) by mouth 2 (two) times daily. 60 tablet 0    Cholecalciferol (VITAMIN D3) 25 MCG (1000 UT) CAPS Take 1,000 Units by mouth daily.       esomeprazole (NEXIUM) 20 MG capsule Take 20 mg by mouth daily.       estradiol  (ESTRACE  VAGINAL) 0.1 MG/GM vaginal cream Place 1 applicatorful vaginally at bedtime as directed 42.5 g 12    famotidine  (PEPCID ) 20 MG tablet Take 20 mg by mouth as needed for heartburn or indigestion.      lidocaine -prilocaine  (EMLA ) cream Apply topically as needed. 30 g 1    magnesium  oxide (MAG-OX) 400 (240 Mg) MG tablet Take 1 tablet (400 mg total) by mouth daily. 30 tablet 1    Multiple Vitamins-Minerals (MULTI FOR HER PO) Take 1 tablet by mouth daily.      ondansetron  (ZOFRAN -ODT) 4 MG disintegrating tablet Take 1 tablet (4 mg total) by mouth every 8 (eight) hours as needed for nausea or vomiting. 20 tablet 0    oxyCODONE  (OXY IR/ROXICODONE ) 5 MG immediate release tablet Take 1 tablet (5 mg total) by mouth every 6 (six) hours as needed for severe pain (pain  score 7-10) or moderate pain (pain score 4-6). 90 tablet 0    polyethylene glycol (MIRALAX  / GLYCOLAX ) 17 g packet Take 17 g by mouth 2 (two) times daily. (Patient not taking: Reported on 11/09/2023) 14 each 0    prochlorperazine  (COMPAZINE ) 10 MG tablet Take 1 tablet by mouth every 6 hours as needed (Nausea or vomiting). 30 tablet 1    No current facility-administered medications for this encounter.   ROS  Review of Systems  Constitutional:  Positive for fatigue.  Respiratory: Negative.    Cardiovascular:  Positive for leg swelling.  Gastrointestinal:  Positive for abdominal distention.       LLQ colostomy Peristomal hernia   Genitourinary:  Positive for vaginal discharge.  Psychiatric/Behavioral:  The patient is nervous/anxious.    Vital signs:  BP 125/72 (BP Location: Right Arm)   Pulse 71   Temp 97.9 F (36.6 C) (Oral)   Resp 18   SpO2 99%  Exam:  Physical Exam Vitals reviewed.   Constitutional:      Appearance: Normal appearance.  HENT:     Mouth/Throat:     Mouth: Mucous membranes are moist.  Cardiovascular:     Rate and Rhythm: Normal rate.  Abdominal:     Palpations: Abdomen is soft.     Hernia: A hernia is present.  Skin:    General: Skin is warm.  Neurological:     Mental Status: She is alert. Mental status is at baseline.  Psychiatric:        Behavior: Behavior normal.     Stoma type/location:  LLQ colostomy  Stomal assessment/size:  oval, 2 1/2 diameter.  Peristomal assessment:  Peristomal bulging.  Skin is intact  Treatment options for stomal/peristomal skin: stoma powder and skin prep  barrier ring.  Flexible 2 piece pouch system Output: soft brown stool  Ostomy pouching: 2 pc.  Education provided:  Stoma changes shape and size at times.  She is managing pouch well, struggles with pouch changes alone. She lies on the floor to visualize stoma.     Impression/dx  Parastomal hernia Colostomy Prolapsed stoma  Discussion  See back as needed.  Switch to 4 pouch if stoma is too large for two piece pouch .  Plan  See back as needed.      Visit time: 40 minutes.   Darice Cooley FNP-BC

## 2024-02-27 ENCOUNTER — Ambulatory Visit (HOSPITAL_COMMUNITY)
Admission: RE | Admit: 2024-02-27 | Discharge: 2024-02-27 | Disposition: A | Discharge status: Home / Self Care | Source: Ambulatory Visit | Attending: *Deleted | Admitting: *Deleted

## 2024-02-27 ENCOUNTER — Telehealth: Payer: Self-pay | Admitting: Nurse Practitioner

## 2024-02-27 DIAGNOSIS — K9400 Colostomy complication, unspecified: Secondary | ICD-10-CM

## 2024-02-27 DIAGNOSIS — K9409 Other complications of colostomy: Secondary | ICD-10-CM | POA: Diagnosis not present

## 2024-02-27 DIAGNOSIS — K435 Parastomal hernia without obstruction or  gangrene: Secondary | ICD-10-CM

## 2024-02-27 DIAGNOSIS — Z933 Colostomy status: Secondary | ICD-10-CM | POA: Diagnosis present

## 2024-02-27 NOTE — Progress Notes (Signed)
 [facility]   Reason for visit:  LLQ colostomy with parastomal hernia HPI:  Peritoneal adenicarcinoma   Past Medical History:  Diagnosis Date   Anxiety state, unspecified    Diverticulosis    Esophageal reflux    Family history of breast cancer    Family history of lymphoma    Family history of stomach cancer    Family history of uterine cancer    Fibroid tumor    Headache    due to allergies    Hiatal hernia    Hyperlipidemia    IBS (irritable bowel syndrome)    Internal hemorrhoids    Nonspecific elevation of levels of transaminase or lactic acid dehydrogenase (LDH)    ovarian ca dx'd 11/2018   Renal stone 07/2013   Tubulovillous adenoma of colon    Varicose veins    superficail thrombophlebitis (left LE)   Family History  Problem Relation Age of Onset   Lymphoma Father        dx 28s   Heart disease Father    Hypertension Father    Hypertension Mother    Hyperlipidemia Mother    Breast cancer Mother 68   Diabetes Maternal Grandmother    Uterine cancer Maternal Grandmother        dx 45s   Stomach cancer Paternal Grandfather    Breast cancer Other        grandmother's sisters, both dx 81s   Colon cancer Neg Hx    Allergies  Allergen Reactions   Carboplatin  Other (See Comments)    Chest tightness/burning, flushing.  See Progress Note from 12/29/2022.   Skin Adhesives [Cyanoacrylate] Hives   Shrimp [Shellfish Allergy] Itching   Sulfasalazine Nausea And Vomiting   Sulfonamide Derivatives Nausea Only   Telithromycin     Other Reaction(s): severe dizziness   Current Outpatient Medications  Medication Sig Dispense Refill Last Dose/Taking   acetaminophen  (TYLENOL ) 500 MG tablet Take 500 mg by mouth every 6 (six) hours as needed for mild pain (pain score 1-3) or moderate pain (pain score 4-6).      ALPRAZolam  (XANAX ) 0.25 MG tablet Take 1 tablet (0.25 mg total) by mouth 2 (two) times daily as needed for anxiety or sleep. 60 tablet 0    apixaban  (ELIQUIS )  5 MG TABS tablet Take 1 tablet (5 mg total) by mouth 2 (two) times daily. 60 tablet 0    Cholecalciferol (VITAMIN D3) 25 MCG (1000 UT) CAPS Take 1,000 Units by mouth daily.       esomeprazole (NEXIUM) 20 MG capsule Take 20 mg by mouth daily.       estradiol  (ESTRACE  VAGINAL) 0.1 MG/GM vaginal cream Place 1 applicatorful vaginally at bedtime as directed 42.5 g 12    famotidine  (PEPCID ) 20 MG tablet Take 20 mg by mouth as needed for heartburn or indigestion.      lidocaine -prilocaine  (EMLA ) cream Apply topically as needed. 30 g 1    magnesium  oxide (MAG-OX) 400 (240 Mg) MG tablet Take 1 tablet (400 mg total) by mouth daily. 30 tablet 1    Multiple Vitamins-Minerals (MULTI FOR HER PO) Take 1 tablet by mouth daily.      ondansetron  (ZOFRAN -ODT) 4 MG disintegrating tablet Take 1 tablet (4 mg total) by mouth every 8 (eight) hours as needed for nausea or vomiting. 20 tablet 0    oxyCODONE  (OXY IR/ROXICODONE ) 5 MG immediate release tablet Take 1 tablet (5 mg total) by mouth every 6 (six) hours as needed for severe  pain (pain score 7-10) or moderate pain (pain score 4-6). 90 tablet 0    polyethylene glycol (MIRALAX  / GLYCOLAX ) 17 g packet Take 17 g by mouth 2 (two) times daily. (Patient not taking: Reported on 11/09/2023) 14 each 0    prochlorperazine  (COMPAZINE ) 10 MG tablet Take 1 tablet by mouth every 6 hours as needed (Nausea or vomiting). 30 tablet 1    No current facility-administered medications for this encounter.   ROS  Review of Systems  Constitutional:  Positive for fatigue.  Respiratory: Negative.    Gastrointestinal:        Prolapsed colostomy Parastomal hernia  Musculoskeletal: Negative.   Skin: Negative.   Psychiatric/Behavioral: Negative.    All other systems reviewed and are negative.  Vital signs:  BP 130/85 (BP Location: Right Arm)   Pulse 82   Temp 98 F (36.7 C) (Oral)   Resp 19   SpO2 98%  Exam:  Physical Exam Vitals reviewed.  Constitutional:      Appearance: Normal  appearance.  HENT:     Mouth/Throat:     Mouth: Mucous membranes are moist.  Cardiovascular:     Rate and Rhythm: Normal rate.  Pulmonary:     Effort: Pulmonary effort is normal.  Abdominal:     Palpations: Abdomen is soft.     Hernia: A hernia is present.  Musculoskeletal:        General: Normal range of motion.  Skin:    General: Skin is warm and dry.     Findings: Erythema present.  Neurological:     Mental Status: She is alert and oriented to person, place, and time. Mental status is at baseline.  Psychiatric:        Mood and Affect: Mood normal.        Behavior: Behavior normal.     Stoma type/location:  LLQ prolapsed colostomy with parastomal hernia  Stomal assessment/size:  2 cm x 3.2 cm  pink stoma  afferent limb protrudes 3 cm today.  Peristomal assessment:  intact skin   parastomal bulging consistent with hernia  Treatment options for stomal/peristomal skin: stoma powder and skin prep   barrier ring and 2 piece flexible coloplast pouch (flat)  Output: soft brown stool  Ostomy pouching: 2pc. Flat  flexible convex Education provided:  No changes.      Impression/dx  Prolapsed stoma  parastomal hernia  Discussion  See back as needed.  No changes to pouching.  Plan  Some difficulty with indepdenent pouch application.  She is able to manage by lying flat on the floor.     Visit time: 40 minutes.   Darice Cooley FNP-BC

## 2024-02-27 NOTE — Telephone Encounter (Signed)
 Scheduled patient for palliative appointment. Called and spoke with the patient, she is aware.

## 2024-03-05 ENCOUNTER — Ambulatory Visit (HOSPITAL_COMMUNITY)
Admission: RE | Admit: 2024-03-05 | Discharge: 2024-03-05 | Disposition: A | Discharge status: Home / Self Care | Source: Ambulatory Visit | Attending: Nurse Practitioner | Admitting: Nurse Practitioner

## 2024-03-05 DIAGNOSIS — K9409 Other complications of colostomy: Secondary | ICD-10-CM | POA: Insufficient documentation

## 2024-03-05 DIAGNOSIS — K435 Parastomal hernia without obstruction or  gangrene: Secondary | ICD-10-CM | POA: Diagnosis not present

## 2024-03-05 DIAGNOSIS — K9400 Colostomy complication, unspecified: Secondary | ICD-10-CM

## 2024-03-05 DIAGNOSIS — Z933 Colostomy status: Secondary | ICD-10-CM | POA: Diagnosis present

## 2024-03-05 NOTE — Progress Notes (Signed)
 [facility]   Reason for visit:  LLQ flush colostomy with prolapse to afferent limb.  HPI:  Peritoneal carcinomatosis Past Medical History:  Diagnosis Date   Anxiety state, unspecified    Diverticulosis    Esophageal reflux    Family history of breast cancer    Family history of lymphoma    Family history of stomach cancer    Family history of uterine cancer    Fibroid tumor    Headache    due to allergies    Hiatal hernia    Hyperlipidemia    IBS (irritable bowel syndrome)    Internal hemorrhoids    Nonspecific elevation of levels of transaminase or lactic acid dehydrogenase (LDH)    ovarian ca dx'd 11/2018   Renal stone 07/2013   Tubulovillous adenoma of colon    Varicose veins    superficail thrombophlebitis (left LE)   Family History  Problem Relation Age of Onset   Lymphoma Father        dx 48s   Heart disease Father    Hypertension Father    Hypertension Mother    Hyperlipidemia Mother    Breast cancer Mother 29   Diabetes Maternal Grandmother    Uterine cancer Maternal Grandmother        dx 59s   Stomach cancer Paternal Grandfather    Breast cancer Other        grandmother's sisters, both dx 27s   Colon cancer Neg Hx    Allergies  Allergen Reactions   Carboplatin  Other (See Comments)    Chest tightness/burning, flushing.  See Progress Note from 12/29/2022.   Skin Adhesives [Cyanoacrylate] Hives   Shrimp [Shellfish Allergy] Itching   Sulfasalazine Nausea And Vomiting   Sulfonamide Derivatives Nausea Only   Telithromycin     Other Reaction(s): severe dizziness   Current Outpatient Medications  Medication Sig Dispense Refill Last Dose/Taking   acetaminophen  (TYLENOL ) 500 MG tablet Take 500 mg by mouth every 6 (six) hours as needed for mild pain (pain score 1-3) or moderate pain (pain score 4-6).      ALPRAZolam  (XANAX ) 0.25 MG tablet Take 1 tablet (0.25 mg total) by mouth 2 (two) times daily as needed for anxiety or sleep. 60 tablet 0     apixaban  (ELIQUIS ) 5 MG TABS tablet Take 1 tablet (5 mg total) by mouth 2 (two) times daily. 60 tablet 0    Cholecalciferol (VITAMIN D3) 25 MCG (1000 UT) CAPS Take 1,000 Units by mouth daily.       esomeprazole (NEXIUM) 20 MG capsule Take 20 mg by mouth daily.       estradiol  (ESTRACE  VAGINAL) 0.1 MG/GM vaginal cream Place 1 applicatorful vaginally at bedtime as directed 42.5 g 12    famotidine  (PEPCID ) 20 MG tablet Take 20 mg by mouth as needed for heartburn or indigestion.      lidocaine -prilocaine  (EMLA ) cream Apply topically as needed. 30 g 1    magnesium  oxide (MAG-OX) 400 (240 Mg) MG tablet Take 1 tablet (400 mg total) by mouth daily. 30 tablet 1    Multiple Vitamins-Minerals (MULTI FOR HER PO) Take 1 tablet by mouth daily.      ondansetron  (ZOFRAN -ODT) 4 MG disintegrating tablet Take 1 tablet (4 mg total) by mouth every 8 (eight) hours as needed for nausea or vomiting. 20 tablet 0    oxyCODONE  (OXY IR/ROXICODONE ) 5 MG immediate release tablet Take 1 tablet (5 mg total) by mouth every 6 (six) hours as needed  for severe pain (pain score 7-10) or moderate pain (pain score 4-6). 90 tablet 0    polyethylene glycol (MIRALAX  / GLYCOLAX ) 17 g packet Take 17 g by mouth 2 (two) times daily. (Patient not taking: Reported on 11/09/2023) 14 each 0    prochlorperazine  (COMPAZINE ) 10 MG tablet Take 1 tablet by mouth every 6 hours as needed (Nausea or vomiting). 30 tablet 1    No current facility-administered medications for this encounter.   ROS  Review of Systems  Respiratory: Negative.    Gastrointestinal:        Parastomal hernia Prolapsed stoma  Musculoskeletal: Negative.   Skin: Negative.   Psychiatric/Behavioral:  Positive for dysphoric mood.   All other systems reviewed and are negative.  Vital signs:  BP 125/70 (BP Location: Right Arm)   Pulse 77   Temp 98 F (36.7 C) (Oral)   Resp 18   SpO2 98%  Exam:  Physical Exam Vitals reviewed.  Constitutional:      Appearance: Normal  appearance.  HENT:     Mouth/Throat:     Mouth: Mucous membranes are moist.  Cardiovascular:     Rate and Rhythm: Normal rate.  Pulmonary:     Effort: Pulmonary effort is normal.     Breath sounds: Normal breath sounds.  Abdominal:     Hernia: A hernia is present.  Musculoskeletal:        General: Normal range of motion.  Skin:    General: Skin is warm and dry.  Neurological:     General: No focal deficit present.     Mental Status: She is alert.  Psychiatric:        Behavior: Behavior normal.     Stoma type/location:  LLQ prolapsed colostomy Stomal assessment/size:  2 cm x 3.2 cm  pink stoma with prolapse  Peristomal assessment:  intact skin peristomal bulging consistent with hernia Treatment options for stomal/peristomal skin: stoma powder and skin prep barrier ring  Output: soft brown stool  Ostomy pouching:2pc.  Flexible coloplast pouch  flat  Education provided:  no changes     Impression/dx  Parastomal hernia  Prolapsed colostomy  Discussion  Continue 2 piece flexible coloplast Plan  See back as needed.     Visit time: 45 minutes.   Darice Cooley FNP-BC

## 2024-03-15 ENCOUNTER — Ambulatory Visit (HOSPITAL_COMMUNITY)
Admission: RE | Admit: 2024-03-15 | Discharge: 2024-03-15 | Disposition: A | Discharge status: Home / Self Care | Source: Ambulatory Visit | Attending: Plastic Surgery | Admitting: Plastic Surgery

## 2024-03-15 DIAGNOSIS — Z433 Encounter for attention to colostomy: Secondary | ICD-10-CM | POA: Insufficient documentation

## 2024-03-15 DIAGNOSIS — K435 Parastomal hernia without obstruction or  gangrene: Secondary | ICD-10-CM | POA: Diagnosis not present

## 2024-03-15 DIAGNOSIS — K9400 Colostomy complication, unspecified: Secondary | ICD-10-CM

## 2024-03-15 NOTE — Progress Notes (Signed)
 [facility]   Reason for visit:  Colostomy with parastomal hernia  HPI:  Peritoneal carcinamatoma with loop colostomy  Past Medical History:  Diagnosis Date   Anxiety state, unspecified    Diverticulosis    Esophageal reflux    Family history of breast cancer    Family history of lymphoma    Family history of stomach cancer    Family history of uterine cancer    Fibroid tumor    Headache    due to allergies    Hiatal hernia    Hyperlipidemia    IBS (irritable bowel syndrome)    Internal hemorrhoids    Nonspecific elevation of levels of transaminase or lactic acid dehydrogenase (LDH)    ovarian ca dx'd 11/2018   Renal stone 07/2013   Tubulovillous adenoma of colon    Varicose veins    superficail thrombophlebitis (left LE)   Family History  Problem Relation Age of Onset   Lymphoma Father        dx 79s   Heart disease Father    Hypertension Father    Hypertension Mother    Hyperlipidemia Mother    Breast cancer Mother 27   Diabetes Maternal Grandmother    Uterine cancer Maternal Grandmother        dx 50s   Stomach cancer Paternal Grandfather    Breast cancer Other        grandmother's sisters, both dx 31s   Colon cancer Neg Hx    Allergies  Allergen Reactions   Carboplatin  Other (See Comments)    Chest tightness/burning, flushing.  See Progress Note from 12/29/2022.   Skin Adhesives [Cyanoacrylate] Hives   Shrimp [Shellfish Allergy] Itching   Sulfasalazine Nausea And Vomiting   Sulfonamide Derivatives Nausea Only   Telithromycin     Other Reaction(s): severe dizziness   Current Outpatient Medications  Medication Sig Dispense Refill Last Dose/Taking   acetaminophen  (TYLENOL ) 500 MG tablet Take 500 mg by mouth every 6 (six) hours as needed for mild pain (pain score 1-3) or moderate pain (pain score 4-6).      ALPRAZolam  (XANAX ) 0.25 MG tablet Take 1 tablet (0.25 mg total) by mouth 2 (two) times daily as needed for anxiety or sleep. 60 tablet 0     apixaban  (ELIQUIS ) 5 MG TABS tablet Take 1 tablet (5 mg total) by mouth 2 (two) times daily. 60 tablet 0    Cholecalciferol (VITAMIN D3) 25 MCG (1000 UT) CAPS Take 1,000 Units by mouth daily.       esomeprazole (NEXIUM) 20 MG capsule Take 20 mg by mouth daily.       estradiol  (ESTRACE  VAGINAL) 0.1 MG/GM vaginal cream Place 1 applicatorful vaginally at bedtime as directed 42.5 g 12    famotidine  (PEPCID ) 20 MG tablet Take 20 mg by mouth as needed for heartburn or indigestion.      lidocaine -prilocaine  (EMLA ) cream Apply topically as needed. 30 g 1    magnesium  oxide (MAG-OX) 400 (240 Mg) MG tablet Take 1 tablet (400 mg total) by mouth daily. 30 tablet 1    Multiple Vitamins-Minerals (MULTI FOR HER PO) Take 1 tablet by mouth daily.      ondansetron  (ZOFRAN -ODT) 4 MG disintegrating tablet Take 1 tablet (4 mg total) by mouth every 8 (eight) hours as needed for nausea or vomiting. 20 tablet 0    oxyCODONE  (OXY IR/ROXICODONE ) 5 MG immediate release tablet Take 1 tablet (5 mg total) by mouth every 6 (six) hours as needed  for severe pain (pain score 7-10) or moderate pain (pain score 4-6). 90 tablet 0    polyethylene glycol (MIRALAX  / GLYCOLAX ) 17 g packet Take 17 g by mouth 2 (two) times daily. (Patient not taking: Reported on 11/09/2023) 14 each 0    prochlorperazine  (COMPAZINE ) 10 MG tablet Take 1 tablet by mouth every 6 hours as needed (Nausea or vomiting). 30 tablet 1    No current facility-administered medications for this encounter.   ROS  Review of Systems  Constitutional:  Positive for fatigue.  Eyes: Negative.   Respiratory: Negative.    Cardiovascular: Negative.        Hx DVT  Gastrointestinal:        Colostomy with parastomal hernia   Musculoskeletal: Negative.   Skin:  Positive for color change.       Peristomal redness  Neurological: Negative.   Psychiatric/Behavioral:  Positive for dysphoric mood.   All other systems reviewed and are negative.  Vital signs:  BP 129/63   Pulse  77   Temp 97.8 F (36.6 C) (Oral)   Resp 18   SpO2 98%  Exam:  Physical Exam  Stoma type/location:  LLQ colostomy, flush with skin  Stomal assessment/size:  2 1/4 slightly oval prolapse Peristomal assessment:  Bulging from 12 to 6 o'clock consistent with parastomal hernia  Treatment options for stomal/peristomal skin: stoma powder and skin prep   barrier ring and 2 piece flexible flat (coloplast)  Output: soft brown stool  Ostomy pouching: 2pc.  Education provided:  conntinue same pouching   discussed reducing stoma and ongoing needs.     Impression/dx  Colostomy complication  Discussion  See back as needed  no changes Plan  See back as needed.     Visit time: 45 minutes.   Darice Cooley FNP-BC

## 2024-03-25 ENCOUNTER — Inpatient Hospital Stay: Attending: Gynecologic Oncology | Admitting: Nurse Practitioner

## 2024-03-25 ENCOUNTER — Inpatient Hospital Stay

## 2024-03-25 ENCOUNTER — Encounter: Payer: Self-pay | Admitting: Nurse Practitioner

## 2024-03-25 ENCOUNTER — Ambulatory Visit (HOSPITAL_COMMUNITY)
Admission: RE | Admit: 2024-03-25 | Discharge: 2024-03-25 | Disposition: A | Source: Ambulatory Visit | Attending: *Deleted | Admitting: *Deleted

## 2024-03-25 ENCOUNTER — Inpatient Hospital Stay: Admitting: Nurse Practitioner

## 2024-03-25 VITALS — BP 135/84 | HR 87 | Temp 97.3°F | Resp 15 | Ht 62.0 in | Wt 146.0 lb

## 2024-03-25 DIAGNOSIS — C786 Secondary malignant neoplasm of retroperitoneum and peritoneum: Secondary | ICD-10-CM | POA: Insufficient documentation

## 2024-03-25 DIAGNOSIS — C569 Malignant neoplasm of unspecified ovary: Secondary | ICD-10-CM | POA: Insufficient documentation

## 2024-03-25 DIAGNOSIS — Z23 Encounter for immunization: Secondary | ICD-10-CM | POA: Diagnosis not present

## 2024-03-25 DIAGNOSIS — K435 Parastomal hernia without obstruction or  gangrene: Secondary | ICD-10-CM | POA: Insufficient documentation

## 2024-03-25 DIAGNOSIS — K9409 Other complications of colostomy: Secondary | ICD-10-CM | POA: Diagnosis not present

## 2024-03-25 DIAGNOSIS — E612 Magnesium deficiency: Secondary | ICD-10-CM | POA: Diagnosis not present

## 2024-03-25 DIAGNOSIS — R630 Anorexia: Secondary | ICD-10-CM | POA: Diagnosis not present

## 2024-03-25 DIAGNOSIS — M792 Neuralgia and neuritis, unspecified: Secondary | ICD-10-CM | POA: Diagnosis not present

## 2024-03-25 DIAGNOSIS — K9400 Colostomy complication, unspecified: Secondary | ICD-10-CM

## 2024-03-25 DIAGNOSIS — K4091 Unilateral inguinal hernia, without obstruction or gangrene, recurrent: Secondary | ICD-10-CM

## 2024-03-25 DIAGNOSIS — C7960 Secondary malignant neoplasm of unspecified ovary: Secondary | ICD-10-CM

## 2024-03-25 DIAGNOSIS — Z515 Encounter for palliative care: Secondary | ICD-10-CM

## 2024-03-25 DIAGNOSIS — Z87891 Personal history of nicotine dependence: Secondary | ICD-10-CM | POA: Diagnosis not present

## 2024-03-25 LAB — CMP (CANCER CENTER ONLY)
ALT: 15 U/L (ref 0–44)
AST: 24 U/L (ref 15–41)
Albumin: 3.9 g/dL (ref 3.5–5.0)
Alkaline Phosphatase: 151 U/L — ABNORMAL HIGH (ref 38–126)
Anion gap: 5 (ref 5–15)
BUN: 11 mg/dL (ref 8–23)
CO2: 29 mmol/L (ref 22–32)
Calcium: 9.3 mg/dL (ref 8.9–10.3)
Chloride: 106 mmol/L (ref 98–111)
Creatinine: 0.52 mg/dL (ref 0.44–1.00)
GFR, Estimated: >60 mL/min (ref >60)
Glucose, Bld: 85 mg/dL (ref 70–99)
Potassium: 4 mmol/L (ref 3.5–5.1)
Sodium: 140 mmol/L (ref 135–145)
Total Bilirubin: 0.8 mg/dL (ref 0.0–1.2)
Total Protein: 7.1 g/dL (ref 6.5–8.1)

## 2024-03-25 LAB — CBC WITH DIFFERENTIAL (CANCER CENTER ONLY)
Abs Immature Granulocytes: 0.01 K/uL (ref 0.00–0.07)
Basophils Absolute: 0 K/uL (ref 0.0–0.1)
Basophils Relative: 1 %
Eosinophils Absolute: 0.1 K/uL (ref 0.0–0.5)
Eosinophils Relative: 1 %
HCT: 36.4 % (ref 36.0–46.0)
Hemoglobin: 12.2 g/dL (ref 12.0–15.0)
Immature Granulocytes: 0 %
Lymphocytes Relative: 21 %
Lymphs Abs: 1.3 K/uL (ref 0.7–4.0)
MCH: 31 pg (ref 26.0–34.0)
MCHC: 33.5 g/dL (ref 30.0–36.0)
MCV: 92.6 fL (ref 80.0–100.0)
Monocytes Absolute: 0.5 K/uL (ref 0.1–1.0)
Monocytes Relative: 8 %
Neutro Abs: 4.3 K/uL (ref 1.7–7.7)
Neutrophils Relative %: 69 %
Platelet Count: 302 K/uL (ref 150–400)
RBC: 3.93 MIL/uL (ref 3.87–5.11)
RDW: 13 % (ref 11.5–15.5)
WBC Count: 6.2 K/uL (ref 4.0–10.5)
nRBC: 0 % (ref 0.0–0.2)

## 2024-03-25 LAB — MAGNESIUM: Magnesium: 1.5 mg/dL — ABNORMAL LOW (ref 1.7–2.4)

## 2024-03-25 MED ORDER — INFLUENZA VAC SPLIT HIGH-DOSE 0.5 ML IM SUSY
0.5000 mL | PREFILLED_SYRINGE | Freq: Once | INTRAMUSCULAR | Status: AC
  Administered 2024-03-25: 0.5 mL via INTRAMUSCULAR
  Filled 2024-03-25: qty 0.5

## 2024-03-25 NOTE — Progress Notes (Signed)
 Pt presented for palliative appt today, requested flu shot, ordered placed per NP. Pt tolerated injection well, no previous hx of reactions, no questions. Arrived to next appt.

## 2024-03-25 NOTE — Patient Instructions (Signed)

## 2024-03-25 NOTE — Progress Notes (Signed)
 Palliative Medicine [facility]  [phone] [phone]   Name: [name] Date: 03/25/2024 MRN: [id number]  DOB: January 15, 1956  Patient Care Team: Shepard Ade, MD as PCP - General (Internal Medicine) Andra Jovita NOVAK, RN as Registered Nurse Curvin Deward MOULD, MD as Consulting Physician (General Surgery) Lonn Hicks, MD as Consulting Physician (Hematology and Oncology) Eloy Herring, MD (Gynecologic Oncology) Pyrtle, Gordy HERO, MD as Consulting Physician (Gastroenterology) Pickenpack-Cousar, Fannie SAILOR, NP as Nurse Practitioner (Hospice and Palliative Medicine) Silvano Valrie SQUIBB, RN as Registered Nurse [facility] and Palliative Medicine)   INTERVAL HISTORY: Stacy Rivas is a 68 y.o. 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 Palliative is seeing patient for symptom management and goals of care.   SOCIAL HISTORY:     reports that she quit smoking about 43 years ago. Her smoking use included cigarettes. She started smoking about 53 years ago. She has never used smokeless tobacco. She reports current alcohol  use. She reports that she does not use drugs.  ADVANCE DIRECTIVES:  On file. Patient identifies husband Jayson as oncologist with support of her sister and niece Elray and Brittany). Documents reviewed.   CODE STATUS: DNR  PAST MEDICAL HISTORY: Past Medical History:  Diagnosis Date   Anxiety state, unspecified    Diverticulosis    Esophageal reflux    Family history of breast cancer    Family history of lymphoma    Family history of stomach cancer    Family history of uterine cancer    Fibroid tumor    Headache    due to allergies    Hiatal hernia    Hyperlipidemia    IBS (irritable bowel syndrome)    Internal hemorrhoids    Nonspecific elevation of levels of transaminase or lactic acid dehydrogenase (LDH)    ovarian ca dx'd 11/2018    Renal stone 07/2013   Tubulovillous adenoma of colon    Varicose veins    superficail thrombophlebitis (left LE)    ALLERGIES:  is allergic to carboplatin , skin adhesives [cyanoacrylate], shrimp [shellfish allergy], sulfasalazine, sulfonamide derivatives, and telithromycin.  MEDICATIONS:  Current Outpatient Medications  Medication Sig Dispense Refill   ALPRAZolam  (XANAX ) 0.25 MG tablet Take 1 tablet (0.25 mg total) by mouth 2 (two) times daily as needed for anxiety or sleep. 60 tablet 0   Cholecalciferol (VITAMIN D3) 25 MCG (1000 UT) CAPS Take 1,000 Units by mouth daily.      esomeprazole (NEXIUM) 20 MG capsule Take 20 mg by mouth daily.      estradiol  (ESTRACE  VAGINAL) 0.1 MG/GM vaginal cream Place 1 applicatorful vaginally at bedtime as directed 42.5 g 12   famotidine  (PEPCID ) 20 MG tablet Take 20 mg by mouth as needed for heartburn or indigestion.     lidocaine -prilocaine  (EMLA ) cream Apply topically as needed. 30 g 1   magnesium  oxide (MAG-OX) 400 (240 Mg) MG tablet Take 1 tablet (400 mg total) by mouth daily. 30 tablet 1   Multiple Vitamins-Minerals (MULTI FOR HER PO) Take 1 tablet by mouth daily.     ondansetron  (ZOFRAN -ODT) 4 MG disintegrating tablet Take 1 tablet (4 mg total) by mouth every 8 (eight) hours as needed for nausea or vomiting. 20 tablet 0   oxyCODONE  (OXY IR/ROXICODONE ) 5 MG immediate release tablet Take 1 tablet (5 mg total) by mouth every 6 (six) hours as needed for severe pain (pain score 7-10) or moderate pain (  pain score 4-6). 90 tablet 0   prochlorperazine  (COMPAZINE ) 10 MG tablet Take 1 tablet by mouth every 6 hours as needed (Nausea or vomiting). 30 tablet 1   No current facility-administered medications for this visit.    VITAL SIGNS: BP 135/84 (BP Location: Left Arm, Patient Position: Sitting)   Pulse 87   Temp (!) 97.3 F (36.3 C) (Temporal)   Resp 15   Ht 5' 2 (1.575 m)   Wt 146 lb (66.2 kg)   SpO2 100%   BMI 26.70 kg/m  Filed Weights   03/25/24  0930  Weight: 146 lb (66.2 kg)    Estimated body mass index is 26.7 kg/m as calculated from the following:   Height as of this encounter: 5' 2 (1.575 m).   Weight as of this encounter: 146 lb (66.2 kg).  Assessment NAD RRR Normal breathing pattern  Abdominal hernia present  AAO x3 PERFORMANCE STATUS (ECOG) : 1 - Symptomatic but completely ambulatory  IMPRESSION: Discussed the use of AI scribe software for clinical note transcription with the patient, who gave verbal consent to proceed.  History of Present Illness Stacy Rivas is a 68 year old female who presented to clinic for follow-up. No acute distress.   She reports good energy levels and is increasing her physical activity, including plans to start walking more regularly. She engages in activities around the house, plans to visit the beach with her husband, and is active in her church and community.  Patient has a known hernia. She experiences occasional prolapse of her hernia, which interferes with daily activities. She manages symptoms by lying down and adjusting her diet to prevent exacerbation. No significant pain, nausea, vomiting, constipation, or diarrhea. She would like to see a Dietician to discuss dietary constraints related to appetite and hernia with expressed preference of community Dietician.   She takes 430 mg of magnesium  glycinate daily, having switched from magnesium  citrate due to gastrointestinal side effects. She is incorporating magnesium -rich foods like spinach and almonds into her diet. We will redraw labs to monitor levels.   She has completed her course of Eliquis . She attends physical therapy, which has improved the range of motion in her right ankle and leg.  She has a sore in her nose that she manages with saline flushes. Also recommended small amount of Vaseline moisturizer to area.   All questions answered and support provided.   Goals of Care  11/07/23: We discussed the patient's  current illness and what it means in the larger context of their on-going co-morbidities. Natural disease trajectory and expectations were discussed. Mrs. Kihn is realistic in her understanding of incurable cancer. She speaks to awareness of no additional treatment options with a goal of focusing on her comfort.   We discussed goals and philosophy of palliative and hospice. Education provided on ongoing symptom management and hospice referral when appropriate and patient/family feels ready.   She discusses her end-of-life care preferences, expressing a desire not to be resuscitated in a terminal state and preferring inpatient hospice care over dying at home. She wishes to avoid a feeding tube but is open to IV fluids if dehydrated for a defined trial. Education provided on MOST and wishes have been documented and completed as desired.   The patient and family outlined their wishes for the following treatment decisions:  Cardiopulmonary Resuscitation: Do Not Attempt Resuscitation (DNR/No CPR)  Medical Interventions: Limited Additional Interventions: Use medical treatment, IV fluids and cardiac monitoring as indicated, DO NOT  USE intubation or mechanical ventilation. May consider use of less invasive airway support such as BiPAP or CPAP. Also provide comfort measures. Transfer to the hospital if indicated. Avoid intensive care.  If in terminal state or no recovery wishes to focus on comfort. Patient wishes to spend last moments at inpatient hospice unit. No desire to pass away in the home if possible.   Antibiotics: Determine use of limitation of antibiotics when infection occurs  IV Fluids: IV fluids for a defined trial period  Feeding Tube: No feeding tube    I discussed the importance of continued conversation with family and their medical providers regarding overall plan of care and treatment options, ensuring decisions are within the context of the patients values and GOCs. Assessment &  Plan Abdominal hernia Moderate-sized left abdominal hernia with occasional prolapse. - Continue current management and monitoring.  Magnesium  deficiency Managed with magnesium  glycinate 430 mg daily. Previous magnesium  level was 1.5 mg/dL. Concerns about absorption and dietary intake. - Ordered CBC, magnesium , and CMP to evaluate magnesium  levels. - Continue magnesium  glycinate 430 mg daily. - Referred to community dietitian for nutritional guidance.  Right ankle and leg weakness, improved Significant improvement in right ankle and leg strength and range of motion following physical therapy. - Continue current management and monitoring.  Nasal soreness Mild nasal soreness, likely due to irritation. Managed with saline nasal spray. - Apply Vaseline to nasal sore with a Q-tip. Goals of Care Prefers no resuscitation in terminal state or natural passing. Desires hospital evaluation if critically ill but no extensive interventions if terminal. Prefers inpatient hospice at end of life. - Document DNR status and end-of-life care preferences in medical records. - Ensure family awareness of her hospitalization and hospice care wishes. - Facilitate transition to inpatient hospice care when appropriate. -Goals are clear to continue to treat the treatable. Aggressively manage symptoms as needed ensuring best quality of life for as long as she can. Patient and family realistic in their understanding of cancer status and plan of care  Nutritional counseling for chronic disease management She is interested in consulting a nutritionist for dietary management related to chronic disease. Previous experience with a nutritionist during cancer treatment focused on maintaining weight. She is seeking guidance on managing her current condition through diet and is considering contacting [facility] for a consultation. - Contact [facility] for a consultation with a dietitian or nutritionist. -  Provide a referral if required by the nutrition service.  -She will receive the flu shot today.   I will plant to follow-up with patient in 4-6 weeks. Sooner if needed. Labs and port flush in 2-3 weeks. CBC, CMP, Mg  Patient expressed understanding and was in agreement with this plan. She also understands that She can call the clinic at any time with any questions, concerns, or complaints.   Any controlled substances utilized were prescribed in the context of palliative care. PDMP has been reviewed.   Visit consisted of counseling and education dealing with the complex and emotionally intense issues of symptom management and palliative care in the setting of serious and potentially life-threatening illness.  Levon Borer, AGPCNP-BC  Palliative Medicine Team/[facility]

## 2024-03-25 NOTE — Progress Notes (Signed)
 [facility]   Reason for visit:  LLQ prolapsed colostomy with peristomal hernia  HPI:  Loop colostomy  Past Medical History:  Diagnosis Date   Anxiety state, unspecified    Diverticulosis    Esophageal reflux    Family history of breast cancer    Family history of lymphoma    Family history of stomach cancer    Family history of uterine cancer    Fibroid tumor    Headache    due to allergies    Hiatal hernia    Hyperlipidemia    IBS (irritable bowel syndrome)    Internal hemorrhoids    Nonspecific elevation of levels of transaminase or lactic acid dehydrogenase (LDH)    ovarian ca dx'd 11/2018   Renal stone 07/2013   Tubulovillous adenoma of colon    Varicose veins    superficail thrombophlebitis (left LE)   Family History  Problem Relation Age of Onset   Lymphoma Father        dx 73s   Heart disease Father    Hypertension Father    Hypertension Mother    Hyperlipidemia Mother    Breast cancer Mother 35   Diabetes Maternal Grandmother    Uterine cancer Maternal Grandmother        dx 34s   Stomach cancer Paternal Grandfather    Breast cancer Other        grandmother's sisters, both dx 91s   Colon cancer Neg Hx    Allergies  Allergen Reactions   Carboplatin  Other (See Comments)    Chest tightness/burning, flushing.  See Progress Note from 12/29/2022.   Skin Adhesives [Cyanoacrylate] Hives   Shrimp [Shellfish Allergy] Itching   Sulfasalazine Nausea And Vomiting   Sulfonamide Derivatives Nausea Only   Telithromycin     Other Reaction(s): severe dizziness   Current Outpatient Medications  Medication Sig Dispense Refill Last Dose/Taking   ALPRAZolam  (XANAX ) 0.25 MG tablet Take 1 tablet (0.25 mg total) by mouth 2 (two) times daily as needed for anxiety or sleep. 60 tablet 0    Cholecalciferol (VITAMIN D3) 25 MCG (1000 UT) CAPS Take 1,000 Units by mouth daily.       esomeprazole (NEXIUM) 20 MG capsule Take 20 mg by mouth daily.       estradiol  (ESTRACE   VAGINAL) 0.1 MG/GM vaginal cream Place 1 applicatorful vaginally at bedtime as directed 42.5 g 12    famotidine  (PEPCID ) 20 MG tablet Take 20 mg by mouth as needed for heartburn or indigestion.      lidocaine -prilocaine  (EMLA ) cream Apply topically as needed. 30 g 1    magnesium  oxide (MAG-OX) 400 (240 Mg) MG tablet Take 1 tablet (400 mg total) by mouth daily. 30 tablet 1    Multiple Vitamins-Minerals (MULTI FOR HER PO) Take 1 tablet by mouth daily.      ondansetron  (ZOFRAN -ODT) 4 MG disintegrating tablet Take 1 tablet (4 mg total) by mouth every 8 (eight) hours as needed for nausea or vomiting. 20 tablet 0    oxyCODONE  (OXY IR/ROXICODONE ) 5 MG immediate release tablet Take 1 tablet (5 mg total) by mouth every 6 (six) hours as needed for severe pain (pain score 7-10) or moderate pain (pain score 4-6). 90 tablet 0    prochlorperazine  (COMPAZINE ) 10 MG tablet Take 1 tablet by mouth every 6 hours as needed (Nausea or vomiting). 30 tablet 1    No current facility-administered medications for this encounter.   ROS  Review of Systems  Constitutional:  Negative.   HENT: Negative.    Eyes: Negative.   Respiratory: Negative.    Gastrointestinal:        IBS Hemorrhoids Parastomal hernia  Musculoskeletal: Negative.   Skin: Negative.   Psychiatric/Behavioral:  The patient is nervous/anxious.   All other systems reviewed and are negative.  Vital signs:  BP 135/78 (BP Location: Right Arm)   Pulse 80   Temp 98.9 F (37.2 C) (Oral)   Resp 18   SpO2 95%  Exam:  Physical Exam Vitals reviewed.  Constitutional:      Appearance: Normal appearance.  HENT:     Mouth/Throat:     Mouth: Mucous membranes are moist.  Cardiovascular:     Rate and Rhythm: Normal rate.  Pulmonary:     Effort: Pulmonary effort is normal.  Abdominal:     Palpations: Abdomen is soft.     Hernia: A hernia is present.  Musculoskeletal:        General: Normal range of motion.  Skin:    General: Skin is warm and dry.   Neurological:     Mental Status: She is alert and oriented to person, place, and time. Mental status is at baseline.  Psychiatric:        Mood and Affect: Mood normal.        Behavior: Behavior normal.     Stoma type/location:  LLQ colostomy  Stomal assessment/size:  oval  Peristomal assessment:  skin intact today Treatment options for stomal/peristomal skin: stoma powder and skin prep  barrier ring  Output: soft brown stool  Ostomy pouching: flexible 2pc. Radiographer, therapeutic  Education provided:  continues to struggle pouching independently.  Lying on the floor is the easiest, she explains.     Impression/dx  Colostomy complication  Discussion  Her pouch wear time is predictable with occasional leaks.  Her supplies are stable.    Plan  See back as needed    Visit time: 55 minutes.   Darice Cooley FNP-BC

## 2024-03-26 ENCOUNTER — Ambulatory Visit (HOSPITAL_COMMUNITY): Admitting: Nurse Practitioner

## 2024-03-28 NOTE — Discharge Instructions (Signed)
NO changes

## 2024-04-09 ENCOUNTER — Ambulatory Visit (HOSPITAL_COMMUNITY)
Admission: RE | Admit: 2024-04-09 | Discharge: 2024-04-09 | Disposition: A | Discharge status: Home / Self Care | Source: Ambulatory Visit | Attending: Internal Medicine | Admitting: Internal Medicine

## 2024-04-09 ENCOUNTER — Ambulatory Visit (HOSPITAL_COMMUNITY): Admitting: Nurse Practitioner

## 2024-04-09 DIAGNOSIS — Z933 Colostomy status: Secondary | ICD-10-CM | POA: Insufficient documentation

## 2024-04-09 DIAGNOSIS — K9400 Colostomy complication, unspecified: Secondary | ICD-10-CM

## 2024-04-09 DIAGNOSIS — K435 Parastomal hernia without obstruction or  gangrene: Secondary | ICD-10-CM

## 2024-04-09 NOTE — Progress Notes (Signed)
 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  HPI:   Past Medical History:  Diagnosis Date   Anxiety state, unspecified    Diverticulosis    Esophageal reflux    Family history of breast cancer    Family history of lymphoma    Family history of stomach cancer    Family history of uterine cancer    Fibroid tumor    Headache    due to allergies    Hiatal hernia    Hyperlipidemia    IBS (irritable bowel syndrome)    Internal hemorrhoids    Nonspecific elevation of levels of transaminase or lactic acid dehydrogenase (LDH)    ovarian ca dx'd 11/2018   Renal stone 07/2013   Tubulovillous adenoma of colon    Varicose veins    superficail thrombophlebitis (left LE)   Family History  Problem Relation Age of Onset   Lymphoma Father        dx 63s   Heart disease Father    Hypertension Father    Hypertension Mother    Hyperlipidemia Mother    Breast cancer Mother 80   Diabetes Maternal Grandmother    Uterine cancer Maternal Grandmother        dx 39s   Stomach cancer Paternal Grandfather    Breast cancer Other        grandmother's sisters, both dx 66s   Colon cancer Neg Hx    Allergies  Allergen Reactions   Carboplatin  Other (See Comments)    Chest tightness/burning, flushing.  See Progress Note from 12/29/2022.   Skin Adhesives [Cyanoacrylate] Hives   Shrimp [Shellfish Allergy] Itching   Sulfasalazine Nausea And Vomiting   Sulfonamide Derivatives Nausea Only   Telithromycin     Other Reaction(s): severe dizziness   Current Outpatient Medications  Medication Sig Dispense Refill Last Dose/Taking   ALPRAZolam  (XANAX ) 0.25 MG tablet Take 1 tablet (0.25 mg total) by mouth 2 (two) times daily as needed for anxiety or sleep. 60 tablet 0    Baclofen  5 MG TABS Take 1 tablet (5 mg total) by mouth 2 (two) times daily as needed for muscle spasms. 30 tablet 0    Cholecalciferol (VITAMIN D3) 25 MCG (1000 UT) CAPS Take 1,000 Units by  mouth daily.       esomeprazole (NEXIUM) 20 MG capsule Take 20 mg by mouth daily.       estradiol  (ESTRACE  VAGINAL) 0.1 MG/GM vaginal cream Place 1 applicatorful vaginally at bedtime as directed 42.5 g 12    famotidine  (PEPCID ) 20 MG tablet Take 20 mg by mouth as needed for heartburn or indigestion.      lidocaine -prilocaine  (EMLA ) cream Apply topically as needed. 30 g 1    magnesium  oxide (MAG-OX) 400 (240 Mg) MG tablet Take 1 tablet (400 mg total) by mouth daily. 30 tablet 1    Multiple Vitamins-Minerals (MULTI FOR HER PO) Take 1 tablet by mouth daily.      ondansetron  (ZOFRAN -ODT) 4 MG disintegrating tablet Take 1 tablet (4 mg total) by mouth every 8 (eight) hours as needed for nausea or vomiting. 20 tablet 0    oxyCODONE  (OXY IR/ROXICODONE ) 5 MG immediate release tablet Take 1 tablet (5 mg total) by mouth every 6 (six) hours as needed for severe pain (pain score 7-10) or moderate pain (pain score 4-6). 90 tablet 0    prochlorperazine  (COMPAZINE ) 10 MG tablet Take 1 tablet by mouth every 6 hours as  needed (Nausea or vomiting). 30 tablet 1    No current facility-administered medications for this encounter.   ROS  Review of Systems  Respiratory: Negative.    Cardiovascular: Negative.   Gastrointestinal:        Parastomal hernia Colostomy prolapse  Skin: Negative.   Psychiatric/Behavioral:  The patient is nervous/anxious.   All other systems reviewed and are negative.  Vital signs:  BP (!) 145/83   Pulse 80   Temp 97.9 F (36.6 C) (Oral)   Resp 18   SpO2 99%  Exam:  Physical Exam Vitals reviewed.  Constitutional:      Appearance: Normal appearance.  HENT:     Mouth/Throat:     Mouth: Mucous membranes are moist.  Cardiovascular:     Rate and Rhythm: Normal rate.     Pulses: Normal pulses.  Pulmonary:     Effort: Pulmonary effort is normal.  Abdominal:     Hernia: A hernia is present.  Musculoskeletal:        General: Normal range of motion.  Skin:    General: Skin is  warm and dry.  Neurological:     Mental Status: She is alert and oriented to person, place, and time. Mental status is at baseline.  Psychiatric:        Behavior: Behavior normal.        Thought Content: Thought content normal.     Stoma type/location:  LLQ colostomy Stomal assessment/size:  oval, with prolapsed stoma  Peristomal assessment:  peristomal bulging consistent with hernia  Treatment options for stomal/peristomal skin: barrier ring  flexible 2 piece coloplast pouch system Output: soft brown stool  Ostomy pouching: 2pc.  Education provided:  No changes in pouching.  Changes twice weekly.  I personally spent a total of  45 minutes in the care of the patient today including preparing to see the patient, getting/reviewing separately obtained history, counseling and educating, referring and communicating with other health care professionals, documenting clinical information in the EHR, communicating results, and coordinating care.     Impression/dx  Colostomy complication Stomal prolapse Parastomal hernia Discussion  No changes Plan  See back in clinic as needed.     Visit time: 40 minutes.   Darice Cooley FNP-BC

## 2024-04-11 ENCOUNTER — Other Ambulatory Visit (HOSPITAL_COMMUNITY): Payer: Self-pay

## 2024-04-11 ENCOUNTER — Encounter: Payer: Self-pay | Admitting: Nurse Practitioner

## 2024-04-11 ENCOUNTER — Inpatient Hospital Stay: Admitting: Nurse Practitioner

## 2024-04-11 VITALS — BP 123/70 | HR 89 | Temp 98.8°F | Resp 16 | Ht 62.0 in | Wt 141.0 lb

## 2024-04-11 DIAGNOSIS — F419 Anxiety disorder, unspecified: Secondary | ICD-10-CM

## 2024-04-11 DIAGNOSIS — G893 Neoplasm related pain (acute) (chronic): Secondary | ICD-10-CM

## 2024-04-11 DIAGNOSIS — R530 Neoplastic (malignant) related fatigue: Secondary | ICD-10-CM

## 2024-04-11 DIAGNOSIS — C7960 Secondary malignant neoplasm of unspecified ovary: Secondary | ICD-10-CM

## 2024-04-11 DIAGNOSIS — Z515 Encounter for palliative care: Secondary | ICD-10-CM

## 2024-04-11 DIAGNOSIS — M62838 Other muscle spasm: Secondary | ICD-10-CM

## 2024-04-11 MED ORDER — BACLOFEN 5 MG PO TABS
5.0000 mg | ORAL_TABLET | Freq: Two times a day (BID) | ORAL | 0 refills | Status: DC | PRN
  Filled 2024-04-11: qty 30, 15d supply, fill #0

## 2024-04-11 MED ORDER — ALPRAZOLAM 0.25 MG PO TABS
0.2500 mg | ORAL_TABLET | Freq: Two times a day (BID) | ORAL | 0 refills | Status: DC | PRN
  Filled 2024-04-11: qty 60, 30d supply, fill #0

## 2024-04-12 ENCOUNTER — Telehealth: Payer: Self-pay | Admitting: Nurse Practitioner

## 2024-04-12 ENCOUNTER — Encounter: Payer: Self-pay | Admitting: Hematology and Oncology

## 2024-04-12 NOTE — Progress Notes (Signed)
 [facility]  [phone] [phone]   Name: Stacy Rivas Date: 04/12/2024 MRN: [id number]  DOB: 1955/08/30  Patient Care Team: Shepard Ade, MD as PCP - General (Internal Medicine) Andra Jovita NOVAK, RN as Registered Nurse Curvin Deward MOULD, MD as Consulting Physician (General Surgery) Lonn Hicks, MD as Consulting Physician (Hematology and Oncology) Eloy Herring, MD (Gynecologic Oncology) Pyrtle, Gordy HERO, MD as Consulting Physician (Gastroenterology) Pickenpack-Cousar, Fannie SAILOR, NP as Nurse Practitioner (Hospice and Palliative Medicine) Silvano Valrie SQUIBB, RN as Registered Nurse [facility] and Palliative Medicine)   INTERVAL HISTORY: [name] is a 68 y.o. 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 Palliative is seeing patient for symptom management and goals of care.   SOCIAL HISTORY:     reports that she quit smoking about 43 years ago. Her smoking use included cigarettes. She started smoking about 53 years ago. She has never used smokeless tobacco. She reports current alcohol  use. She reports that she does not use drugs.  ADVANCE DIRECTIVES:  On file. Patient identifies husband Jayson as oncologist with support of her sister and niece Elray and Brittany). Documents reviewed.   CODE STATUS: DNR  PAST MEDICAL HISTORY: Past Medical History:  Diagnosis Date   Anxiety state, unspecified    Diverticulosis    Esophageal reflux    Family history of breast cancer    Family history of lymphoma    Family history of stomach cancer    Family history of uterine cancer    Fibroid tumor    Headache    due to allergies    Hiatal hernia    Hyperlipidemia    IBS (irritable bowel syndrome)    Internal hemorrhoids    Nonspecific elevation of levels of transaminase or lactic acid dehydrogenase (LDH)    ovarian ca dx'd 11/2018    Renal stone 07/2013   Tubulovillous adenoma of colon    Varicose veins    superficail thrombophlebitis (left LE)    ALLERGIES:  is allergic to carboplatin , skin adhesives [cyanoacrylate], shrimp [shellfish allergy], sulfasalazine, sulfonamide derivatives, and telithromycin.  MEDICATIONS:  Current Outpatient Medications  Medication Sig Dispense Refill   Baclofen  5 MG TABS Take 1 tablet (5 mg total) by mouth 2 (two) times daily as needed for muscle spasms. 30 tablet 0   ALPRAZolam  (XANAX ) 0.25 MG tablet Take 1 tablet (0.25 mg total) by mouth 2 (two) times daily as needed for anxiety or sleep. 60 tablet 0   Cholecalciferol (VITAMIN D3) 25 MCG (1000 UT) CAPS Take 1,000 Units by mouth daily.      esomeprazole (NEXIUM) 20 MG capsule Take 20 mg by mouth daily.      estradiol  (ESTRACE  VAGINAL) 0.1 MG/GM vaginal cream Place 1 applicatorful vaginally at bedtime as directed 42.5 g 12   famotidine  (PEPCID ) 20 MG tablet Take 20 mg by mouth as needed for heartburn or indigestion.     lidocaine -prilocaine  (EMLA ) cream Apply topically as needed. 30 g 1   magnesium  oxide (MAG-OX) 400 (240 Mg) MG tablet Take 1 tablet (400 mg total) by mouth daily. 30 tablet 1   Multiple Vitamins-Minerals (MULTI FOR HER PO) Take 1 tablet by mouth daily.     ondansetron  (ZOFRAN -ODT) 4 MG disintegrating tablet Take 1 tablet (4 mg total) by mouth every 8 (eight) hours as needed for nausea or vomiting. 20 tablet 0   oxyCODONE  (OXY IR/ROXICODONE ) 5 MG  immediate release tablet Take 1 tablet (5 mg total) by mouth every 6 (six) hours as needed for severe pain (pain score 7-10) or moderate pain (pain score 4-6). 90 tablet 0   prochlorperazine  (COMPAZINE ) 10 MG tablet Take 1 tablet by mouth every 6 hours as needed (Nausea or vomiting). 30 tablet 1   No current facility-administered medications for this visit.    VITAL SIGNS: BP 123/70 (BP Location: Left Arm, Patient Position: Sitting)   Pulse 89   Temp 98.8 F (37.1 C) (Temporal)    Resp 16   Ht 5' 2 (1.575 m)   Wt 141 lb (64 kg)   SpO2 99%   BMI 25.79 kg/m  Filed Weights   04/11/24 1257  Weight: 141 lb (64 kg)    Estimated body mass index is 25.79 kg/m as calculated from the following:   Height as of this encounter: 5' 2 (1.575 m).   Weight as of this encounter: 141 lb (64 kg).  Assessment NAD RRR Normal breathing pattern  Abdominal hernia present  AAO x3 PERFORMANCE STATUS (ECOG) : 1 - Symptomatic but completely ambulatory  IMPRESSION: Discussed the use of AI scribe software for clinical note transcription with the patient, who gave verbal consent to proceed.  History of Present Illness Stacy Rivas is a 68 year old female who presented to clinic due to concerns of new right shoulder, neck, and right sided rib pain not controlled with use of OTC medications. No family present at this time. Denies concerns with nausea, vomiting, constipation, or diarrhea.    All questions answered and support provided.    She experiences intermittent right-sided chest pain under her breast near the rib cage, described as a 'throbby ache' that comes and goes. The pain is associated with increased reflux, possibly related to her hernia, and sometimes feels like gas. She has been advised to restart Nexium for acid reflux daily, spacing it out from magnesium  supplements by at least two hours.  Patient struggles with maintaining magnesium  levels despite dietary efforts, taking supplements usually at night and sometimes during the day. She experiences occasional leg cramps at night, possibly due to low magnesium  levels.   Mrs. Weiher has persistent neck pain, particularly when turning her head to the right, which affects her sleep. She recalls a past nerve issue between L3 and L4 in her neck and wonders if her current pain is related. We also discussed sleeping position with possible muscle strain.   She has a history of cancer and is concerned her symptoms  could be related, although she hopes the cancer is not contributing factor. She has not had any scans since May, with the last chest wall scan in February, and is apprehensive about undergoing another scan. We will defer at this time however Particia is aware if no improvement or worsening in symptoms this may be necessary to determine etiology.   Education provided on use of oxycodone  for moderate to severe pain. Also use of muscle relaxer as needed for muscle spasms.   We will continue to closely monitor and support as needed.    Goals of Care  11/07/23: We discussed the patient's current illness and what it means in the larger context of their on-going co-morbidities. Natural disease trajectory and expectations were discussed. Mrs. Braver is realistic in her understanding of incurable cancer. She speaks to awareness of no additional treatment options with a goal of focusing on her comfort.   We discussed goals and philosophy of palliative  and hospice. Education provided on ongoing symptom management and hospice referral when appropriate and patient/family feels ready.   She discusses her end-of-life care preferences, expressing a desire not to be resuscitated in a terminal state and preferring inpatient hospice care over dying at home. She wishes to avoid a feeding tube but is open to IV fluids if dehydrated for a defined trial. Education provided on MOST and wishes have been documented and completed as desired.   The patient and family outlined their wishes for the following treatment decisions:  Cardiopulmonary Resuscitation: Do Not Attempt Resuscitation (DNR/No CPR)  Medical Interventions: Limited Additional Interventions: Use medical treatment, IV fluids and cardiac monitoring as indicated, DO NOT USE intubation or mechanical ventilation. May consider use of less invasive airway support such as BiPAP or CPAP. Also provide comfort measures. Transfer to the hospital if indicated. Avoid intensive  care.  If in terminal state or no recovery wishes to focus on comfort. Patient wishes to spend last moments at inpatient hospice unit. No desire to pass away in the home if possible.   Antibiotics: Determine use of limitation of antibiotics when infection occurs  IV Fluids: IV fluids for a defined trial period  Feeding Tube: No feeding tube    I discussed the importance of continued conversation with family and their medical providers regarding overall plan of care and treatment options, ensuring decisions are within the context of the patients values and GOCs. Assessment & Plan Abdominal hernia Moderate-sized left abdominal hernia with occasional prolapse. - Continue current management and monitoring.  Magnesium  deficiency Managed with magnesium  glycinate 430 mg daily. Previous magnesium  level was 1.5 mg/dL. Concerns about absorption and dietary intake. - Continue magnesium  glycinate 430 mg daily. - Referred to community dietitian for nutritional guidance.  Right ankle and leg weakness, improved Significant improvement in right ankle and leg strength and range of motion following physical therapy. - Continue current management and monitoring.  Right neck and right chest wall pain Intermittent right neck and chest wall pain, described as an ache, possibly related to musculoskeletal issues or nerve irritation. No recent scans since February, raising concern for potential physiological changes. Differential includes muscular pain, nerve irritation, or stress-related pain. No signs of infection or acute changes on examination. She expresses fear of potential cancer recurrence but is currently managing well overall. - Monitor symptoms over the next few weeks. - Prescribed low dose muscle relaxer as needed, up to twice a day, especially at bedtime. - Consider oxycodone  5-10 mg as needed for severe pain. - Will consider imaging if symptoms worsen or become more consistent.  Gastroesophageal  reflux disease Increased reflux symptoms, possibly related to dietary habits or medication interactions. She has been taking Nexium intermittently, concerned about its impact on magnesium  absorption. - Continue Nexium, ensuring it is taken at least two hours apart from magnesium  supplements.  Anxiety disorder Anxiety managed with Xanax , taken as needed. She reports using half a tablet before bed and requests a refill. - Refilled Xanax  prescription for anxiety management.  I will plant to follow-up with patient in 4-6 weeks. Sooner if needed. Labs and port flush in 2-3 weeks. CBC, CMP, Mg  Patient expressed understanding and was in agreement with this plan. She also understands that She can call the clinic at any time with any questions, concerns, or complaints.   Any controlled substances utilized were prescribed in the context of palliative care. PDMP has been reviewed.   Visit consisted of counseling and education dealing with the complex  and emotionally intense issues of symptom management and palliative care in the setting of serious and potentially life-threatening illness.  Levon Borer, AGPCNP-BC  Palliative Medicine Team/[facility]

## 2024-04-12 NOTE — Telephone Encounter (Signed)
 I left voicemail for patient regarding scheduled port flush w/lab and palliative care appointment per 04/11/2024 los. I advised patient to return my call if time/date does not work for her.

## 2024-04-15 ENCOUNTER — Encounter: Payer: Self-pay | Admitting: Hematology and Oncology

## 2024-04-16 ENCOUNTER — Ambulatory Visit (HOSPITAL_COMMUNITY)
Admission: RE | Admit: 2024-04-16 | Discharge: 2024-04-16 | Disposition: A | Discharge status: Home / Self Care | Source: Ambulatory Visit | Attending: *Deleted | Admitting: *Deleted

## 2024-04-16 DIAGNOSIS — Z433 Encounter for attention to colostomy: Secondary | ICD-10-CM

## 2024-04-16 DIAGNOSIS — Z933 Colostomy status: Secondary | ICD-10-CM | POA: Diagnosis present

## 2024-04-16 DIAGNOSIS — K435 Parastomal hernia without obstruction or  gangrene: Secondary | ICD-10-CM | POA: Diagnosis not present

## 2024-04-16 NOTE — Progress Notes (Signed)
 [facility]   Reason for visit:  LLQ colostomy with parastomal hernia HPI:   Past Medical History:  Diagnosis Date   Anxiety state, unspecified    Diverticulosis    Esophageal reflux    Family history of breast cancer    Family history of lymphoma    Family history of stomach cancer    Family history of uterine cancer    Fibroid tumor    Headache    due to allergies    Hiatal hernia    Hyperlipidemia    IBS (irritable bowel syndrome)    Internal hemorrhoids    Nonspecific elevation of levels of transaminase or lactic acid dehydrogenase (LDH)    ovarian ca dx'd 11/2018   Renal stone 07/2013   Tubulovillous adenoma of colon    Varicose veins    superficail thrombophlebitis (left LE)   Family History  Problem Relation Age of Onset   Lymphoma Father        dx 102s   Heart disease Father    Hypertension Father    Hypertension Mother    Hyperlipidemia Mother    Breast cancer Mother 90   Diabetes Maternal Grandmother    Uterine cancer Maternal Grandmother        dx 69s   Stomach cancer Paternal Grandfather    Breast cancer Other        grandmother's sisters, both dx 65s   Colon cancer Neg Hx    Allergies  Allergen Reactions   Carboplatin  Other (See Comments)    Chest tightness/burning, flushing.  See Progress Note from 12/29/2022.   Skin Adhesives [Cyanoacrylate] Hives   Shrimp [Shellfish Allergy] Itching   Sulfasalazine Nausea And Vomiting   Sulfonamide Derivatives Nausea Only   Telithromycin     Other Reaction(s): severe dizziness   Current Outpatient Medications  Medication Sig Dispense Refill Last Dose/Taking   ALPRAZolam  (XANAX ) 0.25 MG tablet Take 1 tablet (0.25 mg total) by mouth 2 (two) times daily as needed for anxiety or sleep. 60 tablet 0    Baclofen  5 MG TABS Take 1 tablet (5 mg total) by mouth 2 (two) times daily as needed for muscle spasms. 30 tablet 0    Cholecalciferol (VITAMIN D3) 25 MCG (1000 UT) CAPS Take 1,000 Units by mouth daily.        esomeprazole (NEXIUM) 20 MG capsule Take 20 mg by mouth daily.       estradiol  (ESTRACE  VAGINAL) 0.1 MG/GM vaginal cream Place 1 applicatorful vaginally at bedtime as directed 42.5 g 12    famotidine  (PEPCID ) 20 MG tablet Take 20 mg by mouth as needed for heartburn or indigestion.      lidocaine -prilocaine  (EMLA ) cream Apply topically as needed. 30 g 1    magnesium  oxide (MAG-OX) 400 (240 Mg) MG tablet Take 1 tablet (400 mg total) by mouth daily. 30 tablet 1    Multiple Vitamins-Minerals (MULTI FOR HER PO) Take 1 tablet by mouth daily.      ondansetron  (ZOFRAN -ODT) 4 MG disintegrating tablet Take 1 tablet (4 mg total) by mouth every 8 (eight) hours as needed for nausea or vomiting. 20 tablet 0    oxyCODONE  (OXY IR/ROXICODONE ) 5 MG immediate release tablet Take 1 tablet (5 mg total) by mouth every 6 (six) hours as needed for severe pain (pain score 7-10) or moderate pain (pain score 4-6). 90 tablet 0    prochlorperazine  (COMPAZINE ) 10 MG tablet Take 1 tablet by mouth every 6 hours as needed (Nausea or  vomiting). 30 tablet 1    No current facility-administered medications for this encounter.   ROS  Review of Systems  Respiratory: Negative.    Cardiovascular: Negative.   Gastrointestinal:        Colostomy with parastomal hernia  Musculoskeletal: Negative.   Neurological: Negative.   Psychiatric/Behavioral: Negative.    All other systems reviewed and are negative.  Vital signs:  BP 126/76 (BP Location: Right Arm)   Pulse 75   Temp 97.9 F (36.6 C) (Oral)   Resp 20   SpO2 99%  Exam:  Physical Exam Vitals reviewed.  Constitutional:      Appearance: Normal appearance.  Cardiovascular:     Rate and Rhythm: Normal rate.  Pulmonary:     Effort: Pulmonary effort is normal.  Abdominal:     Palpations: Abdomen is soft.     Hernia: A hernia is present.  Musculoskeletal:        General: Normal range of motion.  Skin:    General: Skin is warm and dry.  Neurological:     Mental  Status: She is alert and oriented to person, place, and time. Mental status is at baseline.  Psychiatric:        Mood and Affect: Mood normal.        Behavior: Behavior normal.     Stoma type/location: LLQ colostomy with prolapse, reducible today.  Stomal assessment/size:  oval flush pink and moist  Peristomal assessment:  peristomal bulging consistent with hernia  Treatment options for stomal/peristomal skin: Stoma powder and skin prep  barrier ring and 2 piece flexible flat pouch (coloplast) Output: soft brown stool  Ostomy pouching: 2pc. With barrier ring   Education provided:  no changes    Impression/dx  Parastomal hernia Colostomy  Discussion  No changes. Plan  See back as needed.    I personally spent a total of 50  minutes in the care of the patient today including preparing to see the patient, getting/reviewing separately obtained history, performing a medically appropriate exam/evaluation, counseling and educating, referring and communicating with other health care professionals, documenting clinical information in the EHR, communicating results, and pouch change.   Visit time: 50  minutes.   Darice Cooley FNP-BC

## 2024-04-26 ENCOUNTER — Ambulatory Visit (HOSPITAL_COMMUNITY): Admitting: Nurse Practitioner

## 2024-04-29 ENCOUNTER — Ambulatory Visit (HOSPITAL_COMMUNITY): Admitting: Nurse Practitioner

## 2024-05-06 ENCOUNTER — Encounter: Payer: Self-pay | Admitting: Nurse Practitioner

## 2024-05-06 ENCOUNTER — Inpatient Hospital Stay: Attending: Gynecologic Oncology

## 2024-05-06 ENCOUNTER — Inpatient Hospital Stay (HOSPITAL_BASED_OUTPATIENT_CLINIC_OR_DEPARTMENT_OTHER): Admitting: Nurse Practitioner

## 2024-05-06 ENCOUNTER — Inpatient Hospital Stay

## 2024-05-06 VITALS — BP 125/82 | HR 75 | Temp 97.5°F | Resp 20 | Wt 141.5 lb

## 2024-05-06 DIAGNOSIS — C562 Malignant neoplasm of left ovary: Secondary | ICD-10-CM | POA: Insufficient documentation

## 2024-05-06 DIAGNOSIS — C786 Secondary malignant neoplasm of retroperitoneum and peritoneum: Secondary | ICD-10-CM | POA: Insufficient documentation

## 2024-05-06 DIAGNOSIS — F419 Anxiety disorder, unspecified: Secondary | ICD-10-CM

## 2024-05-06 DIAGNOSIS — G893 Neoplasm related pain (acute) (chronic): Secondary | ICD-10-CM

## 2024-05-06 DIAGNOSIS — E612 Magnesium deficiency: Secondary | ICD-10-CM | POA: Diagnosis not present

## 2024-05-06 DIAGNOSIS — R530 Neoplastic (malignant) related fatigue: Secondary | ICD-10-CM | POA: Diagnosis not present

## 2024-05-06 DIAGNOSIS — Z87891 Personal history of nicotine dependence: Secondary | ICD-10-CM

## 2024-05-06 DIAGNOSIS — C7960 Secondary malignant neoplasm of unspecified ovary: Secondary | ICD-10-CM

## 2024-05-06 DIAGNOSIS — Z66 Do not resuscitate: Secondary | ICD-10-CM | POA: Diagnosis not present

## 2024-05-06 DIAGNOSIS — Z515 Encounter for palliative care: Secondary | ICD-10-CM

## 2024-05-06 DIAGNOSIS — C569 Malignant neoplasm of unspecified ovary: Secondary | ICD-10-CM

## 2024-05-06 LAB — CMP (CANCER CENTER ONLY)
ALT: 15 U/L (ref 0–44)
AST: 31 U/L (ref 15–41)
Albumin: 3.9 g/dL (ref 3.5–5.0)
Alkaline Phosphatase: 174 U/L — ABNORMAL HIGH (ref 38–126)
Anion gap: 11 (ref 5–15)
BUN: 10 mg/dL (ref 8–23)
CO2: 25 mmol/L (ref 22–32)
Calcium: 9.4 mg/dL (ref 8.9–10.3)
Chloride: 102 mmol/L (ref 98–111)
Creatinine: 0.49 mg/dL (ref 0.44–1.00)
GFR, Estimated: >60 mL/min (ref >60)
Glucose, Bld: 97 mg/dL (ref 70–99)
Potassium: 4 mmol/L (ref 3.5–5.1)
Sodium: 138 mmol/L (ref 135–145)
Total Bilirubin: 1.1 mg/dL (ref 0.0–1.2)
Total Protein: 7 g/dL (ref 6.5–8.1)

## 2024-05-06 LAB — CBC WITH DIFFERENTIAL (CANCER CENTER ONLY)
Abs Immature Granulocytes: 0.02 K/uL (ref 0.00–0.07)
Basophils Absolute: 0 K/uL (ref 0.0–0.1)
Basophils Relative: 0 %
Eosinophils Absolute: 0.1 K/uL (ref 0.0–0.5)
Eosinophils Relative: 2 %
HCT: 33.9 % — ABNORMAL LOW (ref 36.0–46.0)
Hemoglobin: 11.3 g/dL — ABNORMAL LOW (ref 12.0–15.0)
Immature Granulocytes: 0 %
Lymphocytes Relative: 19 %
Lymphs Abs: 1.1 K/uL (ref 0.7–4.0)
MCH: 29.7 pg (ref 26.0–34.0)
MCHC: 33.3 g/dL (ref 30.0–36.0)
MCV: 89 fL (ref 80.0–100.0)
Monocytes Absolute: 0.6 K/uL (ref 0.1–1.0)
Monocytes Relative: 10 %
Neutro Abs: 4.1 K/uL (ref 1.7–7.7)
Neutrophils Relative %: 69 %
Platelet Count: 272 K/uL (ref 150–400)
RBC: 3.81 MIL/uL — ABNORMAL LOW (ref 3.87–5.11)
RDW: 13.2 % (ref 11.5–15.5)
WBC Count: 5.9 K/uL (ref 4.0–10.5)
nRBC: 0 % (ref 0.0–0.2)

## 2024-05-06 LAB — MAGNESIUM: Magnesium: 1.6 mg/dL — ABNORMAL LOW (ref 1.7–2.4)

## 2024-05-06 MED ORDER — ALPRAZOLAM 0.25 MG PO TABS
0.2500 mg | ORAL_TABLET | Freq: Two times a day (BID) | ORAL | 0 refills | Status: AC | PRN
  Filled 2024-05-06 – 2024-05-09 (×2): qty 60, 30d supply, fill #0

## 2024-05-06 MED ORDER — BACLOFEN 5 MG PO TABS
5.0000 mg | ORAL_TABLET | Freq: Two times a day (BID) | ORAL | 0 refills | Status: AC | PRN
  Filled 2024-05-06: qty 30, 15d supply, fill #0

## 2024-05-06 NOTE — Progress Notes (Unsigned)
 Palliative Medicine [facility]  [phone] [phone]   Name: Stacy Rivas Date: 05/06/2024 MRN: [id number]  DOB: November 08, 1955  Patient Care Team: Shepard Ade, MD as PCP - General (Internal Medicine) Andra Jovita NOVAK, RN as Registered Nurse Curvin Deward MOULD, MD as Consulting Physician (General Surgery) Lonn Hicks, MD as Consulting Physician (Hematology and Oncology) Eloy Herring, MD (Gynecologic Oncology) Pyrtle, Gordy HERO, MD as Consulting Physician (Gastroenterology) Pickenpack-Cousar, Fannie SAILOR, NP as Nurse Practitioner (Hospice and Palliative Medicine) Silvano Valrie SQUIBB, RN as Registered Nurse (Hospice and Palliative Medicine)   INTERVAL HISTORY: Stacy Rivas is a 68 y.o. 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 Palliative is seeing patient for symptom management and goals of care.   SOCIAL HISTORY:     reports that she quit smoking about 43 years ago. Her smoking use included cigarettes. She started smoking about 53 years ago. She has never used smokeless tobacco. She reports current alcohol  use. She reports that she does not use drugs.  ADVANCE DIRECTIVES:  On file. Patient identifies husband Jayson as oncologist with support of her sister and niece Elray and Brittany). Documents reviewed.   CODE STATUS: DNR  PAST MEDICAL HISTORY: Past Medical History:  Diagnosis Date   Anxiety state, unspecified    Diverticulosis    Esophageal reflux    Family history of breast cancer    Family history of lymphoma    Family history of stomach cancer    Family history of uterine cancer    Fibroid tumor    Headache    due to allergies    Hiatal hernia    Hyperlipidemia    IBS (irritable bowel syndrome)    Internal hemorrhoids    Nonspecific elevation of levels of transaminase or lactic acid dehydrogenase (LDH)    ovarian ca dx'd 11/2018    Renal stone 07/2013   Tubulovillous adenoma of colon    Varicose veins    superficail thrombophlebitis (left LE)    ALLERGIES:  is allergic to carboplatin , skin adhesives [cyanoacrylate], shrimp [shellfish allergy], sulfasalazine, sulfonamide derivatives, and telithromycin.  MEDICATIONS:  Current Outpatient Medications  Medication Sig Dispense Refill   ALPRAZolam  (XANAX ) 0.25 MG tablet Take 1 tablet (0.25 mg total) by mouth 2 (two) times daily as needed for anxiety or sleep. 60 tablet 0   Baclofen  5 MG TABS Take 1 tablet (5 mg total) by mouth 2 (two) times daily as needed. 30 tablet 0   Cholecalciferol (VITAMIN D3) 25 MCG (1000 UT) CAPS Take 1,000 Units by mouth daily.      esomeprazole (NEXIUM) 20 MG capsule Take 20 mg by mouth daily.      estradiol  (ESTRACE  VAGINAL) 0.1 MG/GM vaginal cream Place 1 applicatorful vaginally at bedtime as directed 42.5 g 12   famotidine  (PEPCID ) 20 MG tablet Take 20 mg by mouth as needed for heartburn or indigestion.     lidocaine -prilocaine  (EMLA ) cream Apply topically as needed. 30 g 1   magnesium  oxide (MAG-OX) 400 (240 Mg) MG tablet Take 1 tablet (400 mg total) by mouth daily. 30 tablet 1   Multiple Vitamins-Minerals (MULTI FOR HER PO) Take 1 tablet by mouth daily.     ondansetron  (ZOFRAN -ODT) 4 MG disintegrating tablet Take 1 tablet (4 mg total) by mouth every 8 (eight) hours as needed for nausea or vomiting. 20 tablet 0   oxyCODONE  (OXY IR/ROXICODONE ) 5 MG immediate release tablet  Take 1 tablet (5 mg total) by mouth every 6 (six) hours as needed for severe pain (pain score 7-10) or moderate pain (pain score 4-6). 90 tablet 0   prochlorperazine  (COMPAZINE ) 10 MG tablet Take 1 tablet by mouth every 6 hours as needed (Nausea or vomiting). 30 tablet 1   No current facility-administered medications for this visit.    VITAL SIGNS: BP 125/82   Pulse 75   Temp (!) 97.5 F (36.4 C)   Resp 20   Wt 141 lb 8 oz (64.2 kg)   SpO2 99%   BMI 25.88 kg/m  Filed  Weights   05/06/24 0935  Weight: 141 lb 8 oz (64.2 kg)    Estimated body mass index is 25.88 kg/m as calculated from the following:   Height as of 04/11/24: 5' 2 (1.575 m).   Weight as of this encounter: 141 lb 8 oz (64.2 kg).  Assessment NAD RRR Normal breathing pattern  Abdominal hernia present  AAO x3 PERFORMANCE STATUS (ECOG) : 1 - Symptomatic but completely ambulatory  IMPRESSION: Discussed the use of AI scribe software for clinical note transcription with the patient, who gave verbal consent to proceed.  History of Present Illness Stacy Rivas is a 68 year old female who presented to clinic for symptom management follow-up. No acute distress. Denies concerns for nausea, vomiting, constipation, or diarrhea.   She experiences ongoing shoulder pain with variable intensity. Recently, she had severe pain upon waking that improved as the day progressed. She uses muscle relaxants at night, which help but cause sedation, so she avoids them during the day. Ibuprofen  is more effective for inflammation, taken once during the day and once at night.  She manages a hernia that complicates changing her ostomy bag, requiring her to lie down for the process. She finds this cumbersome but manageable.  Her current medication regimen includes magnesium  supplements, taken one in the morning and two at night, which make her feel relaxed and tired. Her magnesium  levels have improved from 1.5 to 1.6. She also has Xanax  prescribed but does not take it regularly.  I discussed increase in ALK Phos levels and potential causes.  Education provided on limiting certain medications and alcohol  intake. She verbalized understanding.   She has a history of cancer and is concerned her symptoms could be related, although she hopes the cancer is not contributing factor. She has not had any scans since May, with the last chest wall scan in February, and is apprehensive about undergoing another scan due to  significant exposure in the past. We will defer at this time however Stacy Rivas is aware if no improvement or worsening in symptoms this may be necessary to determine etiology.   Education provided on use of oxycodone  for moderate to severe pain. Also use of muscle relaxer as needed for muscle spasms.   We will continue to closely monitor and support as needed.    Goals of Care  11/07/23: We discussed the patient's current illness and what it means in the larger context of their on-going co-morbidities. Natural disease trajectory and expectations were discussed. Stacy Rivas is realistic in her understanding of incurable cancer. She speaks to awareness of no additional treatment options with a goal of focusing on her comfort.   We discussed goals and philosophy of palliative and hospice. Education provided on ongoing symptom management and hospice referral when appropriate and patient/family feels ready.   She discusses her end-of-life care preferences, expressing a desire not to be  resuscitated in a terminal state and preferring inpatient hospice care over dying at home. She wishes to avoid a feeding tube but is open to IV fluids if dehydrated for a defined trial. Education provided on MOST and wishes have been documented and completed as desired.   The patient and family outlined their wishes for the following treatment decisions:  Cardiopulmonary Resuscitation: Do Not Attempt Resuscitation (DNR/No CPR)  Medical Interventions: Limited Additional Interventions: Use medical treatment, IV fluids and cardiac monitoring as indicated, DO NOT USE intubation or mechanical ventilation. May consider use of less invasive airway support such as BiPAP or CPAP. Also provide comfort measures. Transfer to the hospital if indicated. Avoid intensive care.  If in terminal state or no recovery wishes to focus on comfort. Patient wishes to spend last moments at inpatient hospice unit. No desire to pass away in the home if  possible.   Antibiotics: Determine use of limitation of antibiotics when infection occurs  IV Fluids: IV fluids for a defined trial period  Feeding Tube: No feeding tube    I discussed the importance of continued conversation with family and their medical providers regarding overall plan of care and treatment options, ensuring decisions are within the context of the patients values and GOCs. Assessment & Plan Abdominal hernia Moderate-sized left abdominal hernia with occasional prolapse. - Continue current management and monitoring.  Magnesium  deficiency Managed with magnesium  glycinate 430 mg daily. Previous magnesium  level was 1.5 mg/dL. Levels are up this visit to 1.6.  - Continue magnesium  glycinate 430 mg daily. - Referred to community dietitian for nutritional guidance.  Right neck and right chest wall pain Intermittent right neck and chest wall pain, described as an ache, possibly related to musculoskeletal issues or nerve irritation. No recent scans since February, raising concern for potential physiological changes. Differential includes muscular pain, nerve irritation, or stress-related pain. No signs of infection or acute changes on examination. She expresses fear of potential cancer recurrence but is currently managing well overall. - Monitor symptoms over the next few weeks. - Continue baclofen  as needed for spasms.  - Consider oxycodone  5-10 mg as needed for severe pain. - Will consider imaging if symptoms worsen or become more consistent.  Anxiety disorder Anxiety managed with Xanax , taken as needed. She reports using half a tablet before bed and requests a refill. - Refilled Xanax  prescription for anxiety management.  Metastatic ovarian cancer Right shoulder pain. She is concerned about radiation exposure from scans but is reassured about current stability. - Continue current management plan. - Monitor symptoms and will consider imaging if pain persists or worsens as she  has not had scans in over 6 months to evaluate cancer state per discretion of patient.   I will plant to follow-up with patient in 4-6 weeks. Sooner if needed. Labs and port flush in 4-6 weeks. CBC, CMP, Mg  Patient expressed understanding and was in agreement with this plan. She also understands that She can call the clinic at any time with any questions, concerns, or complaints.   Any controlled substances utilized were prescribed in the context of palliative care. PDMP has been reviewed.   Visit consisted of counseling and education dealing with the complex and emotionally intense issues of symptom management and palliative care in the setting of serious and potentially life-threatening illness.  Levon Borer, AGPCNP-BC  Palliative Medicine Team/[facility]

## 2024-05-07 ENCOUNTER — Other Ambulatory Visit (HOSPITAL_COMMUNITY): Payer: Self-pay

## 2024-05-07 ENCOUNTER — Other Ambulatory Visit: Payer: Self-pay

## 2024-05-07 ENCOUNTER — Encounter: Payer: Self-pay | Admitting: Hematology and Oncology

## 2024-05-07 ENCOUNTER — Inpatient Hospital Stay (HOSPITAL_COMMUNITY): Admission: RE | Admit: 2024-05-07 | Discharge: 2024-05-07 | Attending: *Deleted | Admitting: *Deleted

## 2024-05-07 DIAGNOSIS — K435 Parastomal hernia without obstruction or  gangrene: Secondary | ICD-10-CM | POA: Diagnosis not present

## 2024-05-07 DIAGNOSIS — K9400 Colostomy complication, unspecified: Secondary | ICD-10-CM

## 2024-05-07 DIAGNOSIS — K9409 Other complications of colostomy: Secondary | ICD-10-CM

## 2024-05-07 NOTE — Progress Notes (Signed)
 [facility]   Reason for visit:  *** HPI:  *** Past Medical History:  Diagnosis Date   Anxiety state, unspecified    Diverticulosis    Esophageal reflux    Family history of breast cancer    Family history of lymphoma    Family history of stomach cancer    Family history of uterine cancer    Fibroid tumor    Headache    due to allergies    Hiatal hernia    Hyperlipidemia    IBS (irritable bowel syndrome)    Internal hemorrhoids    Nonspecific elevation of levels of transaminase or lactic acid dehydrogenase (LDH)    ovarian ca dx'd 11/2018   Renal stone 07/2013   Tubulovillous adenoma of colon    Varicose veins    superficail thrombophlebitis (left LE)   Family History  Problem Relation Age of Onset   Lymphoma Father        dx 52s   Heart disease Father    Hypertension Father    Hypertension Mother    Hyperlipidemia Mother    Breast cancer Mother 27   Diabetes Maternal Grandmother    Uterine cancer Maternal Grandmother        dx 65s   Stomach cancer Paternal Grandfather    Breast cancer Other        grandmother's sisters, both dx 10s   Colon cancer Neg Hx    Allergies[1] Current Outpatient Medications  Medication Sig Dispense Refill Last Dose/Taking   ALPRAZolam  (XANAX ) 0.25 MG tablet Take 1 tablet (0.25 mg total) by mouth 2 (two) times daily as needed for anxiety or sleep. 60 tablet 0    Baclofen  5 MG TABS Take 1 tablet (5 mg total) by mouth 2 (two) times daily as needed. 30 tablet 0    Cholecalciferol (VITAMIN D3) 25 MCG (1000 UT) CAPS Take 1,000 Units by mouth daily.       esomeprazole (NEXIUM) 20 MG capsule Take 20 mg by mouth daily.       estradiol  (ESTRACE  VAGINAL) 0.1 MG/GM vaginal cream Place 1 applicatorful vaginally at bedtime as directed 42.5 g 12    famotidine  (PEPCID ) 20 MG tablet Take 20 mg by mouth as needed for heartburn or indigestion.      lidocaine -prilocaine  (EMLA ) cream Apply topically as needed. 30 g 1    magnesium  oxide  (MAG-OX) 400 (240 Mg) MG tablet Take 1 tablet (400 mg total) by mouth daily. 30 tablet 1    Multiple Vitamins-Minerals (MULTI FOR HER PO) Take 1 tablet by mouth daily.      ondansetron  (ZOFRAN -ODT) 4 MG disintegrating tablet Take 1 tablet (4 mg total) by mouth every 8 (eight) hours as needed for nausea or vomiting. 20 tablet 0    oxyCODONE  (OXY IR/ROXICODONE ) 5 MG immediate release tablet Take 1 tablet (5 mg total) by mouth every 6 (six) hours as needed for severe pain (pain score 7-10) or moderate pain (pain score 4-6). 90 tablet 0    prochlorperazine  (COMPAZINE ) 10 MG tablet Take 1 tablet by mouth every 6 hours as needed (Nausea or vomiting). 30 tablet 1    No current facility-administered medications for this encounter.   ROS  Review of Systems Vital signs:  BP (!) (P) 143/74 (BP Location: Right Arm)   Pulse (P) 90   Temp (P) 98.3 F (36.8 C) (Oral)   Resp (P) 18   SpO2 (P) 98%  Exam:  Physical Exam  Stoma type/location:  ***  Stomal assessment/size:  *** Peristomal assessment:  *** Treatment options for stomal/peristomal skin: *** Output: *** Ostomy pouching: 1pc./2pc.  Education provided:  ***    Impression/dx  *** Discussion  *** Plan  I personally spent a total of *** minutes in the care of the patient today including {Time Based Coding:210964241}.     Visit time: *** minutes.   Darice Cooley FNP-BC        [1]  Allergies Allergen Reactions   Carboplatin  Other (See Comments)    Chest tightness/burning, flushing.  See Progress Note from 12/29/2022.   Skin Adhesives [Cyanoacrylate] Hives   Shrimp [Shellfish Allergy] Itching   Sulfasalazine Nausea And Vomiting   Sulfonamide Derivatives Nausea Only   Telithromycin     Other Reaction(s): severe dizziness

## 2024-05-09 ENCOUNTER — Other Ambulatory Visit (HOSPITAL_COMMUNITY): Payer: Self-pay

## 2024-05-13 DIAGNOSIS — K9409 Other complications of colostomy: Secondary | ICD-10-CM | POA: Insufficient documentation

## 2024-05-14 ENCOUNTER — Ambulatory Visit (HOSPITAL_COMMUNITY)
Admission: RE | Admit: 2024-05-14 | Discharge: 2024-05-14 | Disposition: A | Discharge status: Home / Self Care | Source: Ambulatory Visit | Attending: *Deleted | Admitting: *Deleted

## 2024-05-14 DIAGNOSIS — K9409 Other complications of colostomy: Secondary | ICD-10-CM | POA: Insufficient documentation

## 2024-05-14 DIAGNOSIS — K435 Parastomal hernia without obstruction or  gangrene: Secondary | ICD-10-CM | POA: Diagnosis not present

## 2024-05-14 DIAGNOSIS — K9400 Colostomy complication, unspecified: Secondary | ICD-10-CM

## 2024-05-14 DIAGNOSIS — Z433 Encounter for attention to colostomy: Secondary | ICD-10-CM | POA: Diagnosis present

## 2024-05-14 NOTE — Progress Notes (Signed)
 [facility]   Reason for visit:  Prolapsed colostomy HPI:  AdenoCarcinoma, colostomy  Past Medical History:  Diagnosis Date   Anxiety state, unspecified    Diverticulosis    Esophageal reflux    Family history of breast cancer    Family history of lymphoma    Family history of stomach cancer    Family history of uterine cancer    Fibroid tumor    Headache    due to allergies    Hiatal hernia    Hyperlipidemia    IBS (irritable bowel syndrome)    Internal hemorrhoids    Nonspecific elevation of levels of transaminase or lactic acid dehydrogenase (LDH)    ovarian ca dx'd 11/2018   Renal stone 07/2013   Tubulovillous adenoma of colon    Varicose veins    superficail thrombophlebitis (left LE)   Family History  Problem Relation Age of Onset   Lymphoma Father        dx 92s   Heart disease Father    Hypertension Father    Hypertension Mother    Hyperlipidemia Mother    Breast cancer Mother 46   Diabetes Maternal Grandmother    Uterine cancer Maternal Grandmother        dx 36s   Stomach cancer Paternal Grandfather    Breast cancer Other        grandmother's sisters, both dx 15s   Colon cancer Neg Hx    Allergies[1] Current Outpatient Medications  Medication Sig Dispense Refill Last Dose/Taking   ALPRAZolam  (XANAX ) 0.25 MG tablet Take 1 tablet (0.25 mg total) by mouth 2 (two) times daily as needed for anxiety or sleep. 60 tablet 0    Baclofen  5 MG TABS Take 1 tablet (5 mg total) by mouth 2 (two) times daily as needed. 30 tablet 0    Cholecalciferol (VITAMIN D3) 25 MCG (1000 UT) CAPS Take 1,000 Units by mouth daily.       esomeprazole (NEXIUM) 20 MG capsule Take 20 mg by mouth daily.       estradiol  (ESTRACE  VAGINAL) 0.1 MG/GM vaginal cream Place 1 applicatorful vaginally at bedtime as directed 42.5 g 12    famotidine  (PEPCID ) 20 MG tablet Take 20 mg by mouth as needed for heartburn or indigestion.      lidocaine -prilocaine  (EMLA ) cream Apply topically as  needed. 30 g 1    magnesium  oxide (MAG-OX) 400 (240 Mg) MG tablet Take 1 tablet (400 mg total) by mouth daily. 30 tablet 1    Multiple Vitamins-Minerals (MULTI FOR HER PO) Take 1 tablet by mouth daily.      ondansetron  (ZOFRAN -ODT) 4 MG disintegrating tablet Take 1 tablet (4 mg total) by mouth every 8 (eight) hours as needed for nausea or vomiting. 20 tablet 0    oxyCODONE  (OXY IR/ROXICODONE ) 5 MG immediate release tablet Take 1-2 tablets (5-10 mg total) by mouth every 6 (six) hours as needed for severe pain (pain score 7-10) or moderate pain (pain score 4-6). 90 tablet 0    prochlorperazine  (COMPAZINE ) 10 MG tablet Take 1 tablet by mouth every 6 hours as needed (Nausea or vomiting). 30 tablet 1    No current facility-administered medications for this encounter.   ROS  Review of Systems  Constitutional:  Positive for fatigue.  Respiratory: Negative.    Cardiovascular: Negative.   Gastrointestinal:  Positive for constipation.       LLQ colostomy, prolapsed stoma  Musculoskeletal: Negative.   Neurological: Negative.   Psychiatric/Behavioral:  Positive for dysphoric mood.   All other systems reviewed and are negative.  Vital signs:  BP 118/73 (BP Location: Right Arm)   Pulse 83   Temp 97.9 F (36.6 C) (Oral)   Resp 20   SpO2 99%  Exam:  Physical Exam Vitals reviewed.  Constitutional:      Appearance: Normal appearance.  HENT:     Mouth/Throat:     Mouth: Mucous membranes are moist.  Cardiovascular:     Rate and Rhythm: Normal rate.  Abdominal:     Hernia: A hernia is present.  Musculoskeletal:        General: Normal range of motion.  Skin:    General: Skin is warm and dry.  Neurological:     Mental Status: She is alert.  Psychiatric:        Behavior: Behavior normal.     Comments: Some blues she states      Stoma type/location:  LLQ colostomy, prolapsed Stomal assessment/size:  Prolapsed stoma extends 2 cm Peristomal assessment:  bulging around stoma consistent  with parastomal hernia Treatment options for stomal/peristomal skin: Barrier ring, 2 piece flexible flat coloplast pouch  Output: soft brown stool  Ostomy pouching: 2pc.  Education provided:  Prolapsed stoma makes independent  pouching difficult, has tried reducing with sugar with mixed results    Impression/dx  Parastomal hernia Prolapsed stoma, colostomy  Discussion  We discuss reducing stoma to make pouching a little easier.  SO she can visualize peristomal area.  Plan  See back as needed.  I personally spent a total of 40 minutes in the care of the patient today including preparing to see the patient, getting/reviewing separately obtained history, performing a medically appropriate exam/evaluation, counseling and educating, documenting clinical information in the EHR, coordinating care, and pouch care, ostomy education .     Visit time: 40 minutes.   Darice Cooley FNP-BC        [1]  Allergies Allergen Reactions   Carboplatin  Other (See Comments)    Chest tightness/burning, flushing.  See Progress Note from 12/29/2022.   Skin Adhesives [Cyanoacrylate] Hives   Shrimp [Shellfish Allergy] Itching   Sulfasalazine Nausea And Vomiting   Sulfonamide Derivatives Nausea Only   Telithromycin     Other Reaction(s): severe dizziness

## 2024-05-18 ENCOUNTER — Emergency Department (HOSPITAL_COMMUNITY)
Admission: EM | Admit: 2024-05-18 | Discharge: 2024-05-18 | Disposition: A | Discharge status: Home / Self Care | Attending: Emergency Medicine | Admitting: Emergency Medicine

## 2024-05-18 ENCOUNTER — Other Ambulatory Visit: Payer: Self-pay

## 2024-05-18 ENCOUNTER — Emergency Department (HOSPITAL_COMMUNITY)

## 2024-05-18 ENCOUNTER — Encounter (HOSPITAL_COMMUNITY): Payer: Self-pay | Admitting: *Deleted

## 2024-05-18 DIAGNOSIS — Z7189 Other specified counseling: Secondary | ICD-10-CM

## 2024-05-18 DIAGNOSIS — R109 Unspecified abdominal pain: Secondary | ICD-10-CM | POA: Diagnosis present

## 2024-05-18 DIAGNOSIS — C799 Secondary malignant neoplasm of unspecified site: Secondary | ICD-10-CM | POA: Diagnosis not present

## 2024-05-18 DIAGNOSIS — C562 Malignant neoplasm of left ovary: Secondary | ICD-10-CM

## 2024-05-18 DIAGNOSIS — M25511 Pain in right shoulder: Secondary | ICD-10-CM | POA: Diagnosis not present

## 2024-05-18 DIAGNOSIS — Z8543 Personal history of malignant neoplasm of ovary: Secondary | ICD-10-CM | POA: Insufficient documentation

## 2024-05-18 LAB — URINALYSIS, ROUTINE W REFLEX MICROSCOPIC
Bilirubin Urine: NEGATIVE
Glucose, UA: NEGATIVE mg/dL
Hgb urine dipstick: NEGATIVE
Ketones, ur: 5 mg/dL — AB
Leukocytes,Ua: NEGATIVE
Nitrite: NEGATIVE
Protein, ur: NEGATIVE mg/dL
Specific Gravity, Urine: 1.016 (ref 1.005–1.030)
pH: 6 (ref 5.0–8.0)

## 2024-05-18 LAB — COMPREHENSIVE METABOLIC PANEL WITH GFR
ALT: 12 U/L (ref 0–44)
AST: 41 U/L (ref 15–41)
Albumin: 4.1 g/dL (ref 3.5–5.0)
Alkaline Phosphatase: 174 U/L — ABNORMAL HIGH (ref 38–126)
Anion gap: 10 (ref 5–15)
BUN: 10 mg/dL (ref 8–23)
CO2: 26 mmol/L (ref 22–32)
Calcium: 9.9 mg/dL (ref 8.9–10.3)
Chloride: 99 mmol/L (ref 98–111)
Creatinine, Ser: 0.5 mg/dL (ref 0.44–1.00)
GFR, Estimated: >60 mL/min
Glucose, Bld: 110 mg/dL — ABNORMAL HIGH (ref 70–99)
Potassium: 4 mmol/L (ref 3.5–5.1)
Sodium: 135 mmol/L (ref 135–145)
Total Bilirubin: 1.1 mg/dL (ref 0.0–1.2)
Total Protein: 7.5 g/dL (ref 6.5–8.1)

## 2024-05-18 LAB — CBC
HCT: 38 % (ref 36.0–46.0)
Hemoglobin: 12.4 g/dL (ref 12.0–15.0)
MCH: 29.5 pg (ref 26.0–34.0)
MCHC: 32.6 g/dL (ref 30.0–36.0)
MCV: 90.3 fL (ref 80.0–100.0)
Platelets: 381 K/uL (ref 150–400)
RBC: 4.21 MIL/uL (ref 3.87–5.11)
RDW: 13.3 % (ref 11.5–15.5)
WBC: 11.2 K/uL — ABNORMAL HIGH (ref 4.0–10.5)
nRBC: 0 % (ref 0.0–0.2)

## 2024-05-18 LAB — LIPASE, BLOOD: Lipase: 12 U/L (ref 11–51)

## 2024-05-18 MED ORDER — ONDANSETRON 4 MG PO TBDP: 4.0000 mg | ORAL_TABLET | Freq: Three times a day (TID) | ORAL | 0 refills | Status: AC | PRN

## 2024-05-18 MED ORDER — HEPARIN SOD (PORK) LOCK FLUSH 100 UNIT/ML IV SOLN
INTRAVENOUS | Status: AC
  Filled 2024-05-18: qty 5

## 2024-05-18 MED ORDER — PROCHLORPERAZINE MALEATE 10 MG PO TABS: 10.0000 mg | ORAL_TABLET | Freq: Four times a day (QID) | ORAL | 1 refills | Status: AC | PRN

## 2024-05-18 MED ORDER — OXYCODONE HCL 5 MG PO TABS: 5.0000 mg | ORAL_TABLET | Freq: Four times a day (QID) | ORAL | 0 refills | Status: AC | PRN

## 2024-05-18 MED ORDER — IOHEXOL 300 MG/ML  SOLN
100.0000 mL | Freq: Once | INTRAMUSCULAR | Status: AC | PRN
  Administered 2024-05-18: 100 mL via INTRAVENOUS

## 2024-05-18 NOTE — ED Provider Notes (Signed)
 " [place] EMERGENCY DEPARTMENT AT [facility] Provider Note   CSN: [id number] Arrival date & time: 05/18/24  1023     Patient presents with: upper back pain, Abdominal Pain, and Flank Pain   Stacy Rivas is a 68 y.o. female.    Abdominal Pain Flank Pain Associated symptoms include abdominal pain.  Patient with history of metastatic ovarian cancer.  Has had pain in her right shoulder for a while now.  Also has had increasing abdominal pain.  Has been seen by oncology.  Plans for imaging if symptoms not improved.  Pain is in the right upper shoulder does go down to the arm a little bit.  No trauma.  Also now having some right upper quadrant abdominal pain.  AST and ALT were normal previously however alk phos mildly elevated.  No weight loss.    Past Medical History:  Diagnosis Date   Anxiety state, unspecified    Diverticulosis    Esophageal reflux    Family history of breast cancer    Family history of lymphoma    Family history of stomach cancer    Family history of uterine cancer    Fibroid tumor    Headache    due to allergies    Hiatal hernia    Hyperlipidemia    IBS (irritable bowel syndrome)    Internal hemorrhoids    Nonspecific elevation of levels of transaminase or lactic acid dehydrogenase (LDH)    ovarian ca dx'd 11/2018   Renal stone 07/2013   Tubulovillous adenoma of colon    Varicose veins    superficail thrombophlebitis (left LE)    Prior to Admission medications  Medication Sig Start Date End Date Taking? Authorizing Provider  ALPRAZolam  (XANAX ) 0.25 MG tablet Take 1 tablet (0.25 mg total) by mouth 2 (two) times daily as needed for anxiety or sleep. 05/06/24   Pickenpack-Cousar, Fannie SAILOR, NP  Baclofen  5 MG TABS Take 1 tablet (5 mg total) by mouth 2 (two) times daily as needed. 05/06/24   Pickenpack-Cousar, Athena N, NP  Cholecalciferol (VITAMIN D3) 25 MCG (1000 UT) CAPS Take 1,000 Units by mouth daily.     [provider]   esomeprazole (NEXIUM) 20 MG capsule Take 20 mg by mouth daily.     [provider]  estradiol  (ESTRACE  VAGINAL) 0.1 MG/GM vaginal cream Place 1 applicatorful vaginally at bedtime as directed 08/21/23   Lonn Hicks, MD  famotidine  (PEPCID ) 20 MG tablet Take 20 mg by mouth as needed for heartburn or indigestion.    [provider]  lidocaine -prilocaine  (EMLA ) cream Apply topically as needed. 04/14/23   Lonn Hicks, MD  magnesium  oxide (MAG-OX) 400 (240 Mg) MG tablet Take 1 tablet (400 mg total) by mouth daily. 07/14/23   Lonn Hicks, MD  Multiple Vitamins-Minerals (MULTI FOR HER PO) Take 1 tablet by mouth daily.    [provider]  ondansetron  (ZOFRAN -ODT) 4 MG disintegrating tablet Take 1 tablet (4 mg total) by mouth every 8 (eight) hours as needed for nausea or vomiting. 05/18/24   Patsey Lot, MD  oxyCODONE  (OXY IR/ROXICODONE ) 5 MG immediate release tablet Take 1-2 tablets (5-10 mg total) by mouth every 6 (six) hours as needed for severe pain (pain score 7-10) or moderate pain (pain score 4-6). 05/18/24   Patsey Lot, MD  prochlorperazine  (COMPAZINE ) 10 MG tablet Take 1 tablet by mouth every 6 hours as needed (Nausea or vomiting). 05/18/24   Patsey Lot, MD    Allergies: Carboplatin ,  Skin adhesives [cyanoacrylate], Shrimp [shellfish allergy], Sulfasalazine, Sulfonamide derivatives, and Telithromycin    Review of Systems  Gastrointestinal:  Positive for abdominal pain.  Genitourinary:  Positive for flank pain.    Updated Vital Signs BP (!) 144/74   Pulse 83   Temp 98.4 F (36.9 C) (Oral)   Resp 18   Wt 64.2 kg   SpO2 100%   BMI 25.88 kg/m   Physical Exam Vitals and nursing note reviewed.  HENT:     Head: Normocephalic.  Cardiovascular:     Rate and Rhythm: Normal rate and regular rhythm.  Pulmonary:     Breath sounds: No wheezing.     Comments: Tender over right upper back.  No rash. Abdominal:     Tenderness: There is abdominal  tenderness.     Comments: Right upper quadrant tenderness no rebound or guarding.  No hernia palpated.  Neurological:     Mental Status: She is alert.   No cervical spine tenderness.  Good range of motion in neck.  (all labs ordered are listed, but only abnormal results are displayed) Labs Reviewed  COMPREHENSIVE METABOLIC PANEL WITH GFR - Abnormal; Notable for the following components:      Result Value   Glucose, Bld 110 (*)    Alkaline Phosphatase 174 (*)    All other components within normal limits  CBC - Abnormal; Notable for the following components:   WBC 11.2 (*)    All other components within normal limits  URINALYSIS, ROUTINE W REFLEX MICROSCOPIC - Abnormal; Notable for the following components:   Ketones, ur 5 (*)    All other components within normal limits  LIPASE, BLOOD    EKG: None  Radiology: CT CHEST ABDOMEN PELVIS W CONTRAST Result Date: 05/18/2024 EXAM: CT CHEST, ABDOMEN AND PELVIS WITH CONTRAST 05/18/2024 04:17:05 PM TECHNIQUE: CT of the chest, abdomen and pelvis was performed with the administration of 100 mL of iohexol  (OMNIPAQUE ) 300 MG/ML solution. Multiplanar reformatted images are provided for review. Automated exposure control, iterative reconstruction, and/or weight based adjustment of the mA/kV was utilized to reduce the radiation dose to as low as reasonably achievable. COMPARISON: CT abdomen and pelvis 09/28/2023. CT chest abdomen and pelvis 07/07/2023. CLINICAL HISTORY: Right upper quadrant and right shoulder pain with history of ovarian malignancy. FINDINGS: CHEST: MEDIASTINUM AND LYMPH NODES: Heart and pericardium are unremarkable. The central airways are clear. Right chest port catheter tip ends in the SVC. No mediastinal, hilar or axillary lymphadenopathy. LUNGS AND PLEURA: There is a stable 4 mm left lower lobe nodule on image 4/138. No new pulmonary nodules are seen. No focal lung infiltrate. No pleural effusion or pneumothorax. ABDOMEN AND PELVIS:  LIVER: There is a new left liver mass with necrotic center measuring 3.9 x 5.3 cm. There are also numerous new hepatic masses in the subcapsular region particularly at the dome of the liver measuring up to 2.5 cm and along the posterior right margin of the liver measuring 3.6 x 2.8 cm. These have significantly increased in size and number when compared to the prior study. GALLBLADDER AND BILE DUCTS: Gallbladder is unremarkable. No biliary ductal dilatation. SPLEEN: There is a new hypodense splenic lesion measuring 6 mm. There are new numerous peritoneal implants surrounding the spleen with the largest near the splenic hilum measuring 3.7 x 5.5 cm. PANCREAS: No acute abnormality. ADRENAL GLANDS: No acute abnormality. KIDNEYS, URETERS AND BLADDER: Punctate calculi in the left kidney. Mild bilateral hydroureteronephrosis, left greater than right, which has decreased from prior.  No perinephric or periureteral stranding. Mild diffuse bladder wall thickening with surrounding inflammation. GI AND BOWEL: Small hiatal hernia. Left sided colostomy again noted. Colonic diverticula present. The appendix is not visualized. Stomach demonstrates no acute abnormality. There is no bowel obstruction. REPRODUCTIVE ORGANS: Again seen are cystic masses in the pelvis. The largest central mass measures up to 8.1 cm and the mass in the left hemipelvis measures up to 6.4 cm. The central mass has slightly decreased in size; however, both of these masses now demonstrate increased soft tissue component. Right lower quadrant cystic mass has significantly increased in size measuring 6.2 x 8.5 cm (previously 3.8 x 6.2 cm). PERITONEUM AND RETROPERITONEUM: Numerous new cystic masses and hypodense masses throughout the peritoneum compatible with peritoneal implants. One of the more dominant new masses is in the anterior left upper quadrant image 2/61 measuring 5.8 x 4.4 cm. Small volume ascites predominantly within the ostomy site. No free air.  VASCULATURE: Aorta is normal in caliber. ABDOMINAL AND PELVIS LYMPH NODES: No lymphadenopathy. BONES AND SOFT TISSUES: No acute osseous abnormality. No focal soft tissue abnormality. IMPRESSION: 1. Progressive metastatic disease with numerous new and enlarging hepatic metastases and a new subcentimeter splenic lesion, consistent with metastasis. 2. Progressive peritoneal carcinomatosis with numerous new implants and small volume ascites. 3. Multifocal cystic pelvic masses with increased soft tissue components, with interval enlargement of the right lower quadrant mass. 4. Mild bilateral hydroureteronephrosis, left greater than right, decreased from prior. 5. Mild diffuse bladder wall thickening with surrounding inflammation, suspicious for cystitis. Electronically signed by: Greig Pique MD 05/18/2024 05:28 PM EST RP Workstation: [id number]     Procedures   Medications Ordered in the ED  iohexol  (OMNIPAQUE ) 300 MG/ML solution 100 mL (100 mLs Intravenous Contrast Given 05/18/24 1610)                                    Medical Decision Making Amount and/or Complexity of Data Reviewed Labs: ordered. Radiology: ordered.  Risk Prescription drug management.   With right shoulder pain along with abdominal pain.  History of metastatic ovarian cancer.  Differential diagnosis does include musculoskeletal pain in the shoulder but also causes such as metastatic disease.  No tenderness over cervical spine.  Does appear pain more in the shoulder.  Will get chest CT to evaluate.  Also has abdominal pain and mild abnormalities of her alk phos.  Will get CT scan abdomen pelvis to evaluate.  Reviewed oncology note.  CT scan showed worsening malignancy.  No UTI on urine.  Does have new metastatic disease and worsening other metastatic disease.  Discussed with patient and she will follow-up with oncology.  Will increase pain medicine and antiemetics.  Will discharge home.     Final diagnoses:  Metastatic  malignant neoplasm, unspecified site [facility])    ED Discharge Orders          Ordered    Ambulatory referral to Hematology / Oncology       Comments: Your emergency department provider has referred you to see a hematology/oncology specialist. These are physicians who specialize in blood disorders and cancers, or findings concerning for cancer. You will receive a phone call from the [facility] Office to set up your appointment within 2 business days: [employer] operate Mon - Fri, 8:00 a.m. to 5:00 p.m.; closed for federally recognized holidays. Please be sure your phone is not set to block numbers during this time.  05/18/24 1744    oxyCODONE  (OXY IR/ROXICODONE ) 5 MG immediate release tablet  Every 6 hours PRN        05/18/24 1745    prochlorperazine  (COMPAZINE ) 10 MG tablet  Every 6 hours PRN        05/18/24 1745    ondansetron  (ZOFRAN -ODT) 4 MG disintegrating tablet  Every 8 hours PRN        05/18/24 1745               Patsey Lot, MD 05/18/24 1748  "

## 2024-05-18 NOTE — Discharge Instructions (Addendum)
 Take the pain medicine and nausea medicine as needed.  Follow-up with oncology.

## 2024-05-18 NOTE — ED Triage Notes (Addendum)
 Pt presents with upper back pain on and off for over a month. She has been seen for it. 1.5 days ago upper rt quad pain and upper rt flank pain with nausea. Vomited x 2 yesterday evening.

## 2024-05-20 ENCOUNTER — Telehealth: Payer: Self-pay | Admitting: Oncology

## 2024-05-20 NOTE — Telephone Encounter (Signed)
 [facility] and scheduled appointment with Dr. Lonn at 1:00 tomorrow - 05/21/2024.

## 2024-05-21 ENCOUNTER — Inpatient Hospital Stay (HOSPITAL_BASED_OUTPATIENT_CLINIC_OR_DEPARTMENT_OTHER): Admitting: Hematology and Oncology

## 2024-05-21 VITALS — BP 127/74 | HR 91 | Temp 97.7°F | Resp 18 | Ht 62.0 in | Wt 137.4 lb

## 2024-05-21 DIAGNOSIS — Z7189 Other specified counseling: Secondary | ICD-10-CM

## 2024-05-21 DIAGNOSIS — C562 Malignant neoplasm of left ovary: Secondary | ICD-10-CM

## 2024-05-24 ENCOUNTER — Encounter: Payer: Self-pay | Admitting: Hematology and Oncology

## 2024-05-24 NOTE — Assessment & Plan Note (Addendum)
 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 Between 2021-2025, she had multiple disease relapse, progressed on niraparib , paclitaxel , Doxil  and bevacizumab , Allergic to carboplatin  and progressed on docetaxel   Pathology: High grade serous, Her 2 neu 1+ MSI stable, serous, ER 40%, Negative genetics, Folate receptor alpha neg, PD-L1 CPS 1% 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 after presentation with bowel obstruction She was recently treated with single agent docetaxel , treatment is complicated by severe anemia requiring blood transfusion support and recent diagnosis of blood clot  CT imaging from May 2025 showed significant disease progression with signs of bilateral hydronephrosis due to compression from her pelvic mass We have access discussion in the past and we made decision to focus on quality of life and discontinue chemotherapy Her last chemotherapy was in April 2025 and she has been under the care of palliative care with hospice services in the past 8 months and appears to be doing well I reviewed CT imaging from December 2025 which showed disease progression which is not unexpected We reiterated and discussed goals of care and she is in agreement to continue to focus on comfort measures

## 2024-05-24 NOTE — Progress Notes (Signed)
  [facility] OFFICE PROGRESS NOTE  Patient Care Team: Shepard Ade, MD as PCP - General (Internal Medicine) Andra Jovita NOVAK, RN as Registered Nurse Curvin Deward MOULD, MD as Consulting Physician (General Surgery) Lonn Hicks, MD as Consulting Physician (Hematology and Oncology) Eloy Herring, MD (Gynecologic Oncology) Pyrtle, Gordy HERO, MD as Consulting Physician (Gastroenterology) Pickenpack-Cousar, Fannie SAILOR, NP as Nurse Practitioner (Hospice and Palliative Medicine) Silvano Valrie SQUIBB, RN as Registered Nurse (Hospice and Palliative Medicine)  Assessment & Plan Left ovarian epithelial cancer [facility]) 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 Between 2021-2025, she had multiple disease relapse, progressed on niraparib , paclitaxel , Doxil  and bevacizumab , Allergic to carboplatin  and progressed on docetaxel   Pathology: High grade serous, Her 2 neu 1+ MSI stable, serous, ER 40%, Negative genetics, Folate receptor alpha neg, PD-L1 CPS 1% 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 after presentation with bowel obstruction She was recently treated with single agent docetaxel , treatment is complicated by severe anemia requiring blood transfusion support and recent diagnosis of blood clot  CT imaging from May 2025 showed significant disease progression with signs of bilateral hydronephrosis due to compression from her pelvic mass We have access discussion in the past and we made decision to focus on quality of life and discontinue chemotherapy Her last chemotherapy was in April 2025 and she has been under the care of palliative care with hospice services in the past 8 months and appears to be doing well I reviewed CT imaging from December 2025 which  showed disease progression which is not unexpected We reiterated and discussed goals of care and she is in agreement to continue to focus on comfort measures Goals of care, counseling/discussion We have another goals of care discussion We discussed importance of taking pain medicine as needed for pain She is established with palliative care services and will continue follow-up as directed  No orders of the defined types were placed in this encounter.    Hicks Lonn, MD  INTERVAL HISTORY: she returns for surveillance follow-up after recent ER visit She has been having slight intermittent abdominal pain and went to the emergency room She had blood work and CT imaging She was subsequently discharged She denies major abdominal pain Her colostomy appears to be functioning well She denies recent nausea or vomiting She has not been taking much of oxycodone  lately I reviewed blood work and imaging study and we discussed goals of care  PHYSICAL EXAMINATION: ECOG PERFORMANCE STATUS: 1 - Symptomatic but completely ambulatory  Vitals:   05/21/24 1328  BP: 127/74  Pulse: 91  Resp: 18  Temp: 97.7 F (36.5 C)  SpO2: 100%   Filed Weights   05/21/24 1328  Weight: 137 lb 6.4 oz (62.3 kg)    Relevant data reviewed during this visit included CBC, CMP, CT imaging

## 2024-05-24 NOTE — Assessment & Plan Note (Addendum)
 We have another goals of care discussion We discussed importance of taking pain medicine as needed for pain She is established with palliative care services and will continue follow-up as directed

## 2024-05-28 ENCOUNTER — Ambulatory Visit (HOSPITAL_COMMUNITY)
Admission: RE | Admit: 2024-05-28 | Discharge: 2024-05-28 | Disposition: A | Discharge status: Home / Self Care | Source: Ambulatory Visit | Attending: *Deleted | Admitting: *Deleted

## 2024-05-28 DIAGNOSIS — K9400 Colostomy complication, unspecified: Secondary | ICD-10-CM

## 2024-05-28 DIAGNOSIS — Z933 Colostomy status: Secondary | ICD-10-CM | POA: Diagnosis present

## 2024-05-28 DIAGNOSIS — K9409 Other complications of colostomy: Secondary | ICD-10-CM | POA: Diagnosis not present

## 2024-05-28 DIAGNOSIS — L24B3 Irritant contact dermatitis related to fecal or urinary stoma or fistula: Secondary | ICD-10-CM | POA: Diagnosis not present

## 2024-05-28 DIAGNOSIS — K435 Parastomal hernia without obstruction or  gangrene: Secondary | ICD-10-CM

## 2024-05-28 NOTE — Progress Notes (Signed)
 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 HPI:   Past Medical History:  Diagnosis Date   Anxiety state, unspecified    Diverticulosis    Esophageal reflux    Family history of breast cancer    Family history of lymphoma    Family history of stomach cancer    Family history of uterine cancer    Fibroid tumor    Headache    due to allergies    Hiatal hernia    Hyperlipidemia    IBS (irritable bowel syndrome)    Internal hemorrhoids    Nonspecific elevation of levels of transaminase or lactic acid dehydrogenase (LDH)    ovarian ca dx'd 11/2018   Renal stone 07/2013   Tubulovillous adenoma of colon    Varicose veins    superficail thrombophlebitis (left LE)   Family History  Problem Relation Age of Onset   Lymphoma Father        dx 75s   Heart disease Father    Hypertension Father    Hypertension Mother    Hyperlipidemia Mother    Breast cancer Mother 74   Diabetes Maternal Grandmother    Uterine cancer Maternal Grandmother        dx 47s   Stomach cancer Paternal Grandfather    Breast cancer Other        grandmother's sisters, both dx 83s   Colon cancer Neg Hx    Allergies[1] Current Outpatient Medications  Medication Sig Dispense Refill Last Dose/Taking   ALPRAZolam  (XANAX ) 0.25 MG tablet Take 1 tablet (0.25 mg total) by mouth 2 (two) times daily as needed for anxiety or sleep. 60 tablet 0    Baclofen  5 MG TABS Take 1 tablet (5 mg total) by mouth 2 (two) times daily as needed. 30 tablet 0    Cholecalciferol (VITAMIN D3) 25 MCG (1000 UT) CAPS Take 1,000 Units by mouth daily.       esomeprazole (NEXIUM) 20 MG capsule Take 20 mg by mouth daily.       estradiol  (ESTRACE  VAGINAL) 0.1 MG/GM vaginal cream Place 1 applicatorful vaginally at bedtime as directed 42.5 g 12    famotidine  (PEPCID ) 20 MG tablet Take 20 mg by mouth as needed for heartburn or indigestion.      lidocaine -prilocaine  (EMLA ) cream Apply  topically as needed. 30 g 1    magnesium  oxide (MAG-OX) 400 (240 Mg) MG tablet Take 1 tablet (400 mg total) by mouth daily. 30 tablet 1    Multiple Vitamins-Minerals (MULTI FOR HER PO) Take 1 tablet by mouth daily.      ondansetron  (ZOFRAN -ODT) 4 MG disintegrating tablet Take 1 tablet (4 mg total) by mouth every 8 (eight) hours as needed for nausea or vomiting. 20 tablet 0    oxyCODONE  (OXY IR/ROXICODONE ) 5 MG immediate release tablet Take 1-2 tablets (5-10 mg total) by mouth every 6 (six) hours as needed for severe pain (pain score 7-10) or moderate pain (pain score 4-6). 90 tablet 0    prochlorperazine  (COMPAZINE ) 10 MG tablet Take 1 tablet by mouth every 6 hours as needed (Nausea or vomiting). 30 tablet 1    No current facility-administered medications for this encounter.   ROS  Review of Systems  Constitutional:  Positive for fatigue.  HENT: Negative.    Eyes: Negative.   Respiratory: Negative.    Cardiovascular: Negative.   Gastrointestinal:        Colostomy with parastomal hernia  Genitourinary: Negative.   Musculoskeletal:  Positive for back pain.  Skin: Negative.   Psychiatric/Behavioral:  Positive for dysphoric mood.   All other systems reviewed and are negative.  Vital signs:  BP 129/69 (BP Location: Right Arm)   Pulse 85   Temp 97.8 F (36.6 C) (Oral)   Resp 18   SpO2 98%  Exam:  Physical Exam Vitals reviewed.  Constitutional:      Appearance: Normal appearance.  HENT:     Mouth/Throat:     Mouth: Mucous membranes are moist.  Cardiovascular:     Rate and Rhythm: Normal rate.     Pulses: Normal pulses.  Pulmonary:     Effort: Pulmonary effort is normal.  Abdominal:     Palpations: Abdomen is soft.  Musculoskeletal:        General: Normal range of motion.  Skin:    General: Skin is warm and dry.  Neurological:     Mental Status: She is alert and oriented to person, place, and time. Mental status is at baseline.  Psychiatric:        Behavior: Behavior  normal.     Stoma type/location:  RLQ loop colostomy with prolapse on afferent limb.  Stomal assessment/size:  Prolapsed pink and moist  Peristomal assessment:  bulging consistent with parastomal hernia  Treatment options for stomal/peristomal skin: stoma powder and skin prep barrier ring  2 piece coloplast flat flexible pouch system Output: soft brown stool  Ostomy pouching: 2pc. With barrier ring  Education provided:  no changes    Impression/dx  Colostomy complication  Prolapsed stoma Parastomal hernia Discussion  No changes  sufficient supplies.  Plan  See back as needed.     Visit time: 40 minutes.   Darice Cooley FNP-BC        [1]  Allergies Allergen Reactions   Carboplatin  Other (See Comments)    Chest tightness/burning, flushing.  See Progress Note from 12/29/2022.   Skin Adhesives [Cyanoacrylate] Hives   Shrimp [Shellfish Allergy] Itching   Sulfasalazine Nausea And Vomiting   Sulfonamide Derivatives Nausea Only   Telithromycin     Other Reaction(s): severe dizziness

## 2024-06-04 ENCOUNTER — Ambulatory Visit (HOSPITAL_COMMUNITY)
Admission: RE | Admit: 2024-06-04 | Discharge: 2024-06-04 | Disposition: A | Discharge status: Home / Self Care | Source: Ambulatory Visit | Attending: *Deleted | Admitting: *Deleted

## 2024-06-04 DIAGNOSIS — K435 Parastomal hernia without obstruction or  gangrene: Secondary | ICD-10-CM

## 2024-06-04 DIAGNOSIS — K9400 Colostomy complication, unspecified: Secondary | ICD-10-CM

## 2024-06-04 DIAGNOSIS — K9409 Other complications of colostomy: Secondary | ICD-10-CM | POA: Diagnosis present

## 2024-06-04 NOTE — Progress Notes (Signed)
 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  HPI:  Adenocarcinoma with resection and loop colostomy  Past Medical History:  Diagnosis Date   Anxiety state, unspecified    Diverticulosis    Esophageal reflux    Family history of breast cancer    Family history of lymphoma    Family history of stomach cancer    Family history of uterine cancer    Fibroid tumor    Headache    due to allergies    Hiatal hernia    Hyperlipidemia    IBS (irritable bowel syndrome)    Internal hemorrhoids    Nonspecific elevation of levels of transaminase or lactic acid dehydrogenase (LDH)    ovarian ca dx'd 11/2018   Renal stone 07/2013   Tubulovillous adenoma of colon    Varicose veins    superficail thrombophlebitis (left LE)   Family History  Problem Relation Age of Onset   Lymphoma Father        dx 3s   Heart disease Father    Hypertension Father    Hypertension Mother    Hyperlipidemia Mother    Breast cancer Mother 79   Diabetes Maternal Grandmother    Uterine cancer Maternal Grandmother        dx 28s   Stomach cancer Paternal Grandfather    Breast cancer Other        grandmother's sisters, both dx 34s   Colon cancer Neg Hx    Allergies[1] Current Outpatient Medications  Medication Sig Dispense Refill Last Dose/Taking   ALPRAZolam  (XANAX ) 0.25 MG tablet Take 1 tablet (0.25 mg total) by mouth 2 (two) times daily as needed for anxiety or sleep. 60 tablet 0    Baclofen  5 MG TABS Take 1 tablet (5 mg total) by mouth 2 (two) times daily as needed. 30 tablet 0    Cholecalciferol (VITAMIN D3) 25 MCG (1000 UT) CAPS Take 1,000 Units by mouth daily.       esomeprazole (NEXIUM) 20 MG capsule Take 20 mg by mouth daily.       estradiol  (ESTRACE  VAGINAL) 0.1 MG/GM vaginal cream Place 1 applicatorful vaginally at bedtime as directed 42.5 g 12    famotidine  (PEPCID ) 20 MG tablet Take 20 mg by mouth as needed for heartburn or indigestion.       lidocaine -prilocaine  (EMLA ) cream Apply topically as needed. 30 g 1    magnesium  oxide (MAG-OX) 400 (240 Mg) MG tablet Take 1 tablet (400 mg total) by mouth daily. 30 tablet 1    Multiple Vitamins-Minerals (MULTI FOR HER PO) Take 1 tablet by mouth daily.      ondansetron  (ZOFRAN -ODT) 4 MG disintegrating tablet Take 1 tablet (4 mg total) by mouth every 8 (eight) hours as needed for nausea or vomiting. 20 tablet 0    oxyCODONE  (OXY IR/ROXICODONE ) 5 MG immediate release tablet Take 1-2 tablets (5-10 mg total) by mouth every 6 (six) hours as needed for severe pain (pain score 7-10) or moderate pain (pain score 4-6). 90 tablet 0    prochlorperazine  (COMPAZINE ) 10 MG tablet Take 1 tablet by mouth every 6 hours as needed (Nausea or vomiting). 30 tablet 1    No current facility-administered medications for this encounter.   ROS  Review of Systems  Constitutional: Negative.   HENT: Negative.    Eyes: Negative.   Respiratory: Negative.    Cardiovascular:  Positive for leg swelling.  Gastrointestinal:  Colostomy with prolapsed stoma  Musculoskeletal: Negative.   Skin:  Positive for color change.  Neurological: Negative.   Hematological: Negative.   Psychiatric/Behavioral:  Positive for dysphoric mood.   All other systems reviewed and are negative.  Vital signs:  BP 130/68   Pulse 93   Temp 97.6 F (36.4 C) (Oral)   Resp 18   SpO2 95%  Exam:  Physical Exam Vitals reviewed.  Constitutional:      Appearance: Normal appearance.  HENT:     Mouth/Throat:     Mouth: Mucous membranes are moist.  Cardiovascular:     Rate and Rhythm: Normal rate.     Pulses: Normal pulses.  Pulmonary:     Effort: Pulmonary effort is normal.  Abdominal:     Palpations: Abdomen is soft.     Hernia: A hernia is present.     Comments: Increased abdominal pain.  Recent scan revealed new lesions.   Skin:    General: Skin is warm and dry.  Neurological:     Mental Status: She is alert and oriented  to person, place, and time. Mental status is at baseline.  Psychiatric:        Behavior: Behavior normal.     Stoma type/location:  LLQ colostomy  Stomal assessment/size:  1 3/4 oval with prolapsed stoma  Peristomal assessment:  bulging consistent with hernia Treatment options for stomal/peristomal skin: stoma powder and skin prep  barrier ring  2 piece flat flexible coloplast pouch  Output: soft brown stool  Ostomy pouching: 2pc. Flat   Education provided:  no changes to pouching    Impression/dx  Colostomy with parastomal hernia  Prolapsed stoma I personally spent a total of 45  minutes in the care of the patient today including preparing to see the patient, getting/reviewing separately obtained history, performing a medically appropriate exam/evaluation, counseling and educating, documenting clinical information in the EHR, coordinating care, and colostomy/hernia care.  Discussion  No  changes to pouching  Plan  See back as needed.     Visit time: 40 minutes.   Darice Cooley FNP-BC        [1]  Allergies Allergen Reactions   Carboplatin  Other (See Comments)    Chest tightness/burning, flushing.  See Progress Note from 12/29/2022.   Skin Adhesives [Cyanoacrylate] Hives   Shrimp [Shellfish Allergy] Itching   Sulfasalazine Nausea And Vomiting   Sulfonamide Derivatives Nausea Only   Telithromycin     Other Reaction(s): severe dizziness

## 2024-06-11 ENCOUNTER — Ambulatory Visit (HOSPITAL_COMMUNITY): Admitting: Nurse Practitioner

## 2024-06-14 ENCOUNTER — Ambulatory Visit (HOSPITAL_COMMUNITY)
Admission: RE | Admit: 2024-06-14 | Discharge: 2024-06-14 | Disposition: A | Source: Ambulatory Visit | Attending: Internal Medicine | Admitting: Internal Medicine

## 2024-06-14 DIAGNOSIS — K435 Parastomal hernia without obstruction or  gangrene: Secondary | ICD-10-CM | POA: Insufficient documentation

## 2024-06-14 DIAGNOSIS — K9403 Colostomy malfunction: Secondary | ICD-10-CM | POA: Insufficient documentation

## 2024-06-14 DIAGNOSIS — Z433 Encounter for attention to colostomy: Secondary | ICD-10-CM | POA: Diagnosis present

## 2024-06-14 NOTE — Progress Notes (Signed)
 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  HPI:   Past Medical History:  Diagnosis Date   Anxiety state, unspecified    Diverticulosis    Esophageal reflux    Family history of breast cancer    Family history of lymphoma    Family history of stomach cancer    Family history of uterine cancer    Fibroid tumor    Headache    due to allergies    Hiatal hernia    Hyperlipidemia    IBS (irritable bowel syndrome)    Internal hemorrhoids    Nonspecific elevation of levels of transaminase or lactic acid dehydrogenase (LDH)    ovarian ca dx'd 11/2018   Renal stone 07/2013   Tubulovillous adenoma of colon    Varicose veins    superficail thrombophlebitis (left LE)   Family History  Problem Relation Age of Onset   Lymphoma Father        dx 41s   Heart disease Father    Hypertension Father    Hypertension Mother    Hyperlipidemia Mother    Breast cancer Mother 49   Diabetes Maternal Grandmother    Uterine cancer Maternal Grandmother        dx 57s   Stomach cancer Paternal Grandfather    Breast cancer Other        grandmother's sisters, both dx 94s   Colon cancer Neg Hx    Allergies[1] Current Outpatient Medications  Medication Sig Dispense Refill Last Dose/Taking   ALPRAZolam  (XANAX ) 0.25 MG tablet Take 1 tablet (0.25 mg total) by mouth 2 (two) times daily as needed for anxiety or sleep. 60 tablet 0    Baclofen  5 MG TABS Take 1 tablet (5 mg total) by mouth 2 (two) times daily as needed. 30 tablet 0    Cholecalciferol (VITAMIN D3) 25 MCG (1000 UT) CAPS Take 1,000 Units by mouth daily.       esomeprazole (NEXIUM) 20 MG capsule Take 20 mg by mouth daily.       estradiol  (ESTRACE  VAGINAL) 0.1 MG/GM vaginal cream Place 1 applicatorful vaginally at bedtime as directed 42.5 g 12    famotidine  (PEPCID ) 20 MG tablet Take 20 mg by mouth as needed for heartburn or indigestion.      lidocaine -prilocaine  (EMLA ) cream Apply  topically as needed. 30 g 1    magnesium  oxide (MAG-OX) 400 (240 Mg) MG tablet Take 1 tablet (400 mg total) by mouth daily. 30 tablet 1    Multiple Vitamins-Minerals (MULTI FOR HER PO) Take 1 tablet by mouth daily.      ondansetron  (ZOFRAN -ODT) 4 MG disintegrating tablet Take 1 tablet (4 mg total) by mouth every 8 (eight) hours as needed for nausea or vomiting. 20 tablet 0    oxyCODONE  (OXY IR/ROXICODONE ) 5 MG immediate release tablet Take 1-2 tablets (5-10 mg total) by mouth every 6 (six) hours as needed for severe pain (pain score 7-10) or moderate pain (pain score 4-6). 90 tablet 0    prochlorperazine  (COMPAZINE ) 10 MG tablet Take 1 tablet by mouth every 6 hours as needed (Nausea or vomiting). 30 tablet 1    No current facility-administered medications for this encounter.   ROS  Review of Systems  Constitutional:  Positive for fatigue.  Respiratory: Negative.    Cardiovascular: Negative.   Gastrointestinal:        Stomal prolapse Colostomy Parastomal hernia  Skin:  Positive for color change.  Psychiatric/Behavioral: Negative.    All other systems reviewed and are negative.  Vital signs:  BP 131/73 (BP Location: Right Arm)   Pulse 88   Temp 97.6 F (36.4 C) (Oral)   Resp 20  Exam:  Physical Exam Vitals reviewed.  Constitutional:      Appearance: Normal appearance.  HENT:     Mouth/Throat:     Mouth: Mucous membranes are moist.  Cardiovascular:     Rate and Rhythm: Normal rate.  Pulmonary:     Effort: Pulmonary effort is normal.  Abdominal:     Hernia: A hernia is present.  Musculoskeletal:        General: Normal range of motion.  Skin:    General: Skin is warm and dry.  Neurological:     Mental Status: She is alert and oriented to person, place, and time. Mental status is at baseline.  Psychiatric:        Mood and Affect: Mood normal.        Behavior: Behavior normal.     Stoma type/location:  LLQ colostomy with prolapsed stoma Stomal assessment/size:  1 3/8  pink productive Peristomal assessment:  bulginig consistent with parastomal hernia  Treatment options for stomal/peristomal skin: barrier ring and 2 piece flexible flat coloplast Output: soft brown  Ostomy pouching: 2pc.  Education provided:  due to prolapse, becoming increasingly difficult to pouch independently.  We discuss reducing stoma with sugar and gently manipulation but she has had limited success with that.  She has to lie on her bathroom floor and it can take her up to an hour.  Its difficult to visualize stoma and peristomal skin     Impression/dx  Colostomy complication, prolapse Parastomal hernia  Discussion  See back as needed Plan  No changes at this time.     Visit time: 50 minutes.   Darice Cooley FNP-BC        [1]  Allergies Allergen Reactions   Carboplatin  Other (See Comments)    Chest tightness/burning, flushing.  See Progress Note from 12/29/2022.   Skin Adhesives [Cyanoacrylate] Hives   Shrimp [Shellfish Allergy] Itching   Sulfasalazine Nausea And Vomiting   Sulfonamide Derivatives Nausea Only   Telithromycin     Other Reaction(s): severe dizziness

## 2024-06-18 ENCOUNTER — Inpatient Hospital Stay

## 2024-06-21 ENCOUNTER — Ambulatory Visit (HOSPITAL_COMMUNITY): Admitting: Nurse Practitioner

## 2024-06-25 ENCOUNTER — Inpatient Hospital Stay: Admitting: Nurse Practitioner

## 2024-06-25 ENCOUNTER — Inpatient Hospital Stay

## 2024-06-28 ENCOUNTER — Inpatient Hospital Stay (HOSPITAL_COMMUNITY): Admission: RE | Admit: 2024-06-28 | Admitting: Nurse Practitioner

## 2024-06-28 VITALS — BP 141/84 | HR 90 | Temp 98.8°F | Resp 18

## 2024-06-28 DIAGNOSIS — K9409 Other complications of colostomy: Secondary | ICD-10-CM

## 2024-06-28 DIAGNOSIS — R1032 Left lower quadrant pain: Secondary | ICD-10-CM

## 2024-06-28 LAB — OCCULT BLOOD X 1 CARD TO LAB, STOOL: Fecal Occult Bld: NEGATIVE

## 2024-06-28 NOTE — Progress Notes (Incomplete)
 [facility]   Reason for visit:  *** HPI:  *** Past Medical History:  Diagnosis Date   Anxiety state, unspecified    Diverticulosis    Esophageal reflux    Family history of breast cancer    Family history of lymphoma    Family history of stomach cancer    Family history of uterine cancer    Fibroid tumor    Headache    due to allergies    Hiatal hernia    Hyperlipidemia    IBS (irritable bowel syndrome)    Internal hemorrhoids    Nonspecific elevation of levels of transaminase or lactic acid dehydrogenase (LDH)    ovarian ca dx'd 11/2018   Renal stone 07/2013   Tubulovillous adenoma of colon    Varicose veins    superficail thrombophlebitis (left LE)   Family History  Problem Relation Age of Onset   Lymphoma Father        dx 61s   Heart disease Father    Hypertension Father    Hypertension Mother    Hyperlipidemia Mother    Breast cancer Mother 23   Diabetes Maternal Grandmother    Uterine cancer Maternal Grandmother        dx 58s   Stomach cancer Paternal Grandfather    Breast cancer Other        grandmother's sisters, both dx 19s   Colon cancer Neg Hx    Allergies[1] Current Outpatient Medications  Medication Sig Dispense Refill Last Dose/Taking   ALPRAZolam  (XANAX ) 0.25 MG tablet Take 1 tablet (0.25 mg total) by mouth 2 (two) times daily as needed for anxiety or sleep. 60 tablet 0    Baclofen  5 MG TABS Take 1 tablet (5 mg total) by mouth 2 (two) times daily as needed. 30 tablet 0    Cholecalciferol (VITAMIN D3) 25 MCG (1000 UT) CAPS Take 1,000 Units by mouth daily.       esomeprazole (NEXIUM) 20 MG capsule Take 20 mg by mouth daily.       estradiol  (ESTRACE  VAGINAL) 0.1 MG/GM vaginal cream Place 1 applicatorful vaginally at bedtime as directed 42.5 g 12    famotidine  (PEPCID ) 20 MG tablet Take 20 mg by mouth as needed for heartburn or indigestion.      lidocaine -prilocaine  (EMLA ) cream Apply topically as needed. 30 g 1    magnesium  oxide  (MAG-OX) 400 (240 Mg) MG tablet Take 1 tablet (400 mg total) by mouth daily. 30 tablet 1    Multiple Vitamins-Minerals (MULTI FOR HER PO) Take 1 tablet by mouth daily.      ondansetron  (ZOFRAN -ODT) 4 MG disintegrating tablet Take 1 tablet (4 mg total) by mouth every 8 (eight) hours as needed for nausea or vomiting. 20 tablet 0    oxyCODONE  (OXY IR/ROXICODONE ) 5 MG immediate release tablet Take 1-2 tablets (5-10 mg total) by mouth every 6 (six) hours as needed for severe pain (pain score 7-10) or moderate pain (pain score 4-6). 90 tablet 0    prochlorperazine  (COMPAZINE ) 10 MG tablet Take 1 tablet by mouth every 6 hours as needed (Nausea or vomiting). 30 tablet 1    No current facility-administered medications for this encounter.   ROS  Review of Systems Vital signs:  There were no vitals taken for this visit. Exam:  Physical Exam  Stoma type/location:  *** Stomal assessment/size:  *** Peristomal assessment:  *** Treatment options for stomal/peristomal skin: *** Output: *** Ostomy pouching: 1pc./2pc.  Education provided:  ***  Impression/dx  *** Discussion  *** Plan  ***    Visit time: *** minutes.   Darice Cooley FNP-BC       [1]  Allergies Allergen Reactions   Carboplatin  Other (See Comments)    Chest tightness/burning, flushing.  See Progress Note from 12/29/2022.   Skin Adhesives [Cyanoacrylate] Hives   Shrimp [Shellfish Allergy] Itching   Sulfasalazine Nausea And Vomiting   Sulfonamide Derivatives Nausea Only   Telithromycin     Other Reaction(s): severe dizziness

## 2024-07-12 ENCOUNTER — Ambulatory Visit (HOSPITAL_COMMUNITY): Admitting: Nurse Practitioner
